# Patient Record
Sex: Male | Born: 1965 | Race: Black or African American | Hispanic: No | Marital: Married | Smoking: Never smoker
Health system: Southern US, Community
[De-identification: ages and names within clinical notes are randomized; demographics above are authoritative.]

## PROBLEM LIST (undated history)

## (undated) ENCOUNTER — Emergency Department (HOSPITAL_BASED_OUTPATIENT_CLINIC_OR_DEPARTMENT_OTHER): Admission: EM | Payer: Self-pay | Source: Home / Self Care

## (undated) DIAGNOSIS — E119 Type 2 diabetes mellitus without complications: Secondary | ICD-10-CM

## (undated) DIAGNOSIS — N186 End stage renal disease: Secondary | ICD-10-CM

## (undated) DIAGNOSIS — C189 Malignant neoplasm of colon, unspecified: Secondary | ICD-10-CM

## (undated) DIAGNOSIS — M1990 Unspecified osteoarthritis, unspecified site: Secondary | ICD-10-CM

## (undated) DIAGNOSIS — I509 Heart failure, unspecified: Secondary | ICD-10-CM

## (undated) DIAGNOSIS — M7989 Other specified soft tissue disorders: Secondary | ICD-10-CM

## (undated) DIAGNOSIS — Z992 Dependence on renal dialysis: Secondary | ICD-10-CM

## (undated) SURGERY — Surgical Case
Anesthesia: *Unknown

---

## 2005-11-27 ENCOUNTER — Emergency Department (HOSPITAL_COMMUNITY): Admission: EM | Admit: 2005-11-27 | Discharge: 2005-11-27 | Payer: Self-pay | Admitting: Emergency Medicine

## 2009-04-02 DIAGNOSIS — IMO0002 Reserved for concepts with insufficient information to code with codable children: Secondary | ICD-10-CM

## 2009-04-02 DIAGNOSIS — M7750 Other enthesopathy of unspecified foot: Secondary | ICD-10-CM | POA: Insufficient documentation

## 2009-04-02 DIAGNOSIS — E113413 Type 2 diabetes mellitus with severe nonproliferative diabetic retinopathy with macular edema, bilateral: Secondary | ICD-10-CM

## 2009-04-02 DIAGNOSIS — E1165 Type 2 diabetes mellitus with hyperglycemia: Secondary | ICD-10-CM

## 2009-04-02 HISTORY — DX: Reserved for concepts with insufficient information to code with codable children: IMO0002

## 2009-04-02 HISTORY — DX: Type 2 diabetes mellitus with severe nonproliferative diabetic retinopathy with macular edema, bilateral: E11.3413

## 2009-04-02 HISTORY — DX: Other enthesopathy of unspecified foot and ankle: M77.50

## 2009-04-02 HISTORY — DX: Type 2 diabetes mellitus with hyperglycemia: E11.65

## 2009-04-06 DIAGNOSIS — E559 Vitamin D deficiency, unspecified: Secondary | ICD-10-CM | POA: Insufficient documentation

## 2014-10-12 ENCOUNTER — Encounter (INDEPENDENT_AMBULATORY_CARE_PROVIDER_SITE_OTHER): Payer: Self-pay | Admitting: Ophthalmology

## 2017-03-21 DIAGNOSIS — E1141 Type 2 diabetes mellitus with diabetic mononeuropathy: Secondary | ICD-10-CM | POA: Diagnosis present

## 2017-03-21 DIAGNOSIS — R809 Proteinuria, unspecified: Secondary | ICD-10-CM | POA: Insufficient documentation

## 2017-03-21 DIAGNOSIS — E1129 Type 2 diabetes mellitus with other diabetic kidney complication: Secondary | ICD-10-CM

## 2017-03-21 HISTORY — DX: Type 2 diabetes mellitus with other diabetic kidney complication: R80.9

## 2017-03-21 HISTORY — DX: Type 2 diabetes mellitus with diabetic mononeuropathy: E11.41

## 2017-03-21 HISTORY — DX: Proteinuria, unspecified: E11.29

## 2018-01-21 ENCOUNTER — Other Ambulatory Visit: Payer: Self-pay

## 2018-01-21 ENCOUNTER — Inpatient Hospital Stay (HOSPITAL_COMMUNITY)
Admission: EM | Admit: 2018-01-21 | Discharge: 2018-01-25 | DRG: 853 | Disposition: A | Discharge status: Home Health | Payer: 59 | Attending: Internal Medicine | Admitting: Internal Medicine

## 2018-01-21 ENCOUNTER — Emergency Department (HOSPITAL_COMMUNITY): Payer: 59

## 2018-01-21 ENCOUNTER — Encounter (HOSPITAL_COMMUNITY): Payer: Self-pay | Admitting: *Deleted

## 2018-01-21 DIAGNOSIS — T797XXA Traumatic subcutaneous emphysema, initial encounter: Secondary | ICD-10-CM | POA: Diagnosis present

## 2018-01-21 DIAGNOSIS — E11621 Type 2 diabetes mellitus with foot ulcer: Secondary | ICD-10-CM | POA: Diagnosis present

## 2018-01-21 DIAGNOSIS — Z23 Encounter for immunization: Secondary | ICD-10-CM

## 2018-01-21 DIAGNOSIS — A4189 Other specified sepsis: Principal | ICD-10-CM | POA: Diagnosis present

## 2018-01-21 DIAGNOSIS — E119 Type 2 diabetes mellitus without complications: Secondary | ICD-10-CM

## 2018-01-21 DIAGNOSIS — S91332A Puncture wound without foreign body, left foot, initial encounter: Secondary | ICD-10-CM | POA: Diagnosis present

## 2018-01-21 DIAGNOSIS — E1140 Type 2 diabetes mellitus with diabetic neuropathy, unspecified: Secondary | ICD-10-CM | POA: Diagnosis present

## 2018-01-21 DIAGNOSIS — L03116 Cellulitis of left lower limb: Secondary | ICD-10-CM | POA: Diagnosis present

## 2018-01-21 DIAGNOSIS — R402140 Coma scale, eyes open, spontaneous, unspecified time: Secondary | ICD-10-CM | POA: Diagnosis not present

## 2018-01-21 DIAGNOSIS — L02612 Cutaneous abscess of left foot: Secondary | ICD-10-CM | POA: Diagnosis present

## 2018-01-21 DIAGNOSIS — M726 Necrotizing fasciitis: Secondary | ICD-10-CM | POA: Diagnosis present

## 2018-01-21 DIAGNOSIS — M7989 Other specified soft tissue disorders: Secondary | ICD-10-CM | POA: Diagnosis not present

## 2018-01-21 DIAGNOSIS — D649 Anemia, unspecified: Secondary | ICD-10-CM | POA: Diagnosis present

## 2018-01-21 DIAGNOSIS — E113413 Type 2 diabetes mellitus with severe nonproliferative diabetic retinopathy with macular edema, bilateral: Secondary | ICD-10-CM

## 2018-01-21 DIAGNOSIS — L97423 Non-pressure chronic ulcer of left heel and midfoot with necrosis of muscle: Secondary | ICD-10-CM

## 2018-01-21 DIAGNOSIS — IMO0002 Reserved for concepts with insufficient information to code with codable children: Secondary | ICD-10-CM

## 2018-01-21 DIAGNOSIS — E1165 Type 2 diabetes mellitus with hyperglycemia: Secondary | ICD-10-CM

## 2018-01-21 DIAGNOSIS — N179 Acute kidney failure, unspecified: Secondary | ICD-10-CM | POA: Diagnosis not present

## 2018-01-21 DIAGNOSIS — Z91030 Bee allergy status: Secondary | ICD-10-CM

## 2018-01-21 DIAGNOSIS — L97529 Non-pressure chronic ulcer of other part of left foot with unspecified severity: Secondary | ICD-10-CM | POA: Diagnosis present

## 2018-01-21 DIAGNOSIS — R402360 Coma scale, best motor response, obeys commands, unspecified time: Secondary | ICD-10-CM | POA: Diagnosis not present

## 2018-01-21 DIAGNOSIS — A419 Sepsis, unspecified organism: Secondary | ICD-10-CM | POA: Diagnosis present

## 2018-01-21 DIAGNOSIS — E11628 Type 2 diabetes mellitus with other skin complications: Secondary | ICD-10-CM | POA: Diagnosis present

## 2018-01-21 DIAGNOSIS — E1169 Type 2 diabetes mellitus with other specified complication: Secondary | ICD-10-CM | POA: Diagnosis present

## 2018-01-21 DIAGNOSIS — M869 Osteomyelitis, unspecified: Secondary | ICD-10-CM | POA: Diagnosis present

## 2018-01-21 DIAGNOSIS — Z794 Long term (current) use of insulin: Secondary | ICD-10-CM

## 2018-01-21 DIAGNOSIS — R402250 Coma scale, best verbal response, oriented, unspecified time: Secondary | ICD-10-CM | POA: Diagnosis not present

## 2018-01-21 HISTORY — DX: Type 2 diabetes mellitus without complications: E11.9

## 2018-01-21 LAB — I-STAT CHEM 8, ED
BUN: 21 mg/dL — ABNORMAL HIGH (ref 6–20)
Calcium, Ion: 1.12 mmol/L — ABNORMAL LOW (ref 1.15–1.40)
Chloride: 95 mmol/L — ABNORMAL LOW (ref 98–111)
Creatinine, Ser: 1.2 mg/dL (ref 0.61–1.24)
Glucose, Bld: 219 mg/dL — ABNORMAL HIGH (ref 70–99)
HCT: 29 % — ABNORMAL LOW (ref 39.0–52.0)
Hemoglobin: 9.9 g/dL — ABNORMAL LOW (ref 13.0–17.0)
Potassium: 4 mmol/L (ref 3.5–5.1)
Sodium: 131 mmol/L — ABNORMAL LOW (ref 135–145)
TCO2: 25 mmol/L (ref 22–32)

## 2018-01-21 LAB — CBC WITH DIFFERENTIAL/PLATELET
Basophils Absolute: 0 10*3/uL (ref 0.0–0.1)
Basophils Relative: 0 %
Eosinophils Absolute: 0 10*3/uL (ref 0.0–0.7)
Eosinophils Relative: 0 %
HCT: 27.9 % — ABNORMAL LOW (ref 39.0–52.0)
Hemoglobin: 9.2 g/dL — ABNORMAL LOW (ref 13.0–17.0)
Lymphocytes Relative: 7 %
Lymphs Abs: 1.2 10*3/uL (ref 0.7–4.0)
MCH: 29.3 pg (ref 26.0–34.0)
MCHC: 33 g/dL (ref 30.0–36.0)
MCV: 88.9 fL (ref 78.0–100.0)
Monocytes Absolute: 1 10*3/uL (ref 0.1–1.0)
Monocytes Relative: 6 %
Neutro Abs: 14.5 10*3/uL — ABNORMAL HIGH (ref 1.7–7.7)
Neutrophils Relative %: 87 %
Platelets: 300 10*3/uL (ref 150–400)
RBC: 3.14 MIL/uL — ABNORMAL LOW (ref 4.22–5.81)
RDW: 12.7 % (ref 11.5–15.5)
WBC: 16.6 10*3/uL — ABNORMAL HIGH (ref 4.0–10.5)

## 2018-01-21 LAB — COMPREHENSIVE METABOLIC PANEL
ALT: 14 U/L (ref 0–44)
AST: 19 U/L (ref 15–41)
Albumin: 3.3 g/dL — ABNORMAL LOW (ref 3.5–5.0)
Alkaline Phosphatase: 90 U/L (ref 38–126)
Anion gap: 15 (ref 5–15)
BUN: 20 mg/dL (ref 6–20)
CO2: 24 mmol/L (ref 22–32)
Calcium: 8.9 mg/dL (ref 8.9–10.3)
Chloride: 93 mmol/L — ABNORMAL LOW (ref 98–111)
Creatinine, Ser: 1.31 mg/dL — ABNORMAL HIGH (ref 0.61–1.24)
GFR calc Af Amer: >60 mL/min (ref >60)
GFR calc non Af Amer: >60 mL/min (ref >60)
Glucose, Bld: 214 mg/dL — ABNORMAL HIGH (ref 70–99)
Potassium: 4.1 mmol/L (ref 3.5–5.1)
Sodium: 132 mmol/L — ABNORMAL LOW (ref 135–145)
Total Bilirubin: 1.9 mg/dL — ABNORMAL HIGH (ref 0.3–1.2)
Total Protein: 8.1 g/dL (ref 6.5–8.1)

## 2018-01-21 LAB — I-STAT CG4 LACTIC ACID, ED: Lactic Acid, Venous: 1.35 mmol/L (ref 0.5–1.9)

## 2018-01-21 MED ORDER — SODIUM CHLORIDE 0.9 % IV BOLUS (SEPSIS)
1000.0000 mL | Freq: Once | INTRAVENOUS | Status: AC
  Administered 2018-01-21: 1000 mL via INTRAVENOUS

## 2018-01-21 MED ORDER — IOPAMIDOL (ISOVUE-300) INJECTION 61%
INTRAVENOUS | Status: AC
  Administered 2018-01-22: 04:00:00
  Filled 2018-01-21: qty 100

## 2018-01-21 MED ORDER — IOPAMIDOL (ISOVUE-300) INJECTION 61%
100.0000 mL | Freq: Once | INTRAVENOUS | Status: AC | PRN
  Administered 2018-01-21: 100 mL via INTRAVENOUS

## 2018-01-21 MED ORDER — VANCOMYCIN HCL IN DEXTROSE 1-5 GM/200ML-% IV SOLN
1000.0000 mg | Freq: Once | INTRAVENOUS | Status: AC
  Administered 2018-01-22: 1000 mg via INTRAVENOUS
  Filled 2018-01-21: qty 200

## 2018-01-21 MED ORDER — PIPERACILLIN-TAZOBACTAM 3.375 G IVPB 30 MIN
3.3750 g | Freq: Once | INTRAVENOUS | Status: AC
  Administered 2018-01-21: 3.375 g via INTRAVENOUS
  Filled 2018-01-21: qty 50

## 2018-01-21 MED ORDER — TETANUS-DIPHTH-ACELL PERTUSSIS 5-2.5-18.5 LF-MCG/0.5 IM SUSP
0.5000 mL | Freq: Once | INTRAMUSCULAR | Status: AC
  Administered 2018-01-21: 0.5 mL via INTRAMUSCULAR
  Filled 2018-01-21: qty 0.5

## 2018-01-21 MED ORDER — CLINDAMYCIN PHOSPHATE 900 MG/50ML IV SOLN
900.0000 mg | Freq: Once | INTRAVENOUS | Status: AC
  Administered 2018-01-22: 900 mg via INTRAVENOUS
  Filled 2018-01-21: qty 50

## 2018-01-21 MED ORDER — ACETAMINOPHEN 500 MG PO TABS
1000.0000 mg | ORAL_TABLET | Freq: Once | ORAL | Status: AC
  Administered 2018-01-21: 1000 mg via ORAL
  Filled 2018-01-21: qty 2

## 2018-01-21 MED ORDER — SODIUM CHLORIDE 0.9 % IV SOLN
1000.0000 mL | INTRAVENOUS | Status: DC
  Administered 2018-01-22 (×4): 1000 mL via INTRAVENOUS

## 2018-01-21 NOTE — ED Notes (Signed)
Chills noted

## 2018-01-21 NOTE — ED Triage Notes (Addendum)
Pt complains of cough, nausea. Pt has left foot ulcer from [place]. Pt has noticed some bleeding to the wound. Pt has hx of diabetes.

## 2018-01-21 NOTE — ED Provider Notes (Signed)
Jeffrey Young Provider Note   CSN: [id number] Arrival date & time: 01/21/18  1848     History   Chief Complaint Chief Complaint  Patient presents with  . Cough  . Wound Check    HPI Jeffrey Young is a 52 y.o. male.  HPI   Patient is a 52 year old male with a history of type 2 diabetes mellitus presenting for left foot ulcer, fever, and cough.  Patient reports that he has had subjective fevers and rigors for 3 days now.  Patient reports that in June 2019, he was on vacation, and his flip-flop on the left foot broke, and he had a puncture wound to the bottom of his left foot.  Patient reports that it has been slow to heal since then, but has not had any increasing erythema or pain.  Patient reports that he has low sensation in his bilateral lower extremities for many months now.  Patient reports that his ulceration appeared to grow larger recently.  Patient also is reporting that he has had a dry cough for greater than 3 months.  Patient denies any sputum production or hemoptysis.  Patient denies any smoking history.  Patient denies any chest pain or shortness of breath.  Patient denies any rhinorrhea, congestion, sore throat.  Patient denies abdominal pain, nausea, vomiting, or diarrhea.  Patient reports applying Vaseline and antibiotic ointment to the ulceration without change in its status.  Past Medical History:  Diagnosis Date  . Diabetes mellitus without complication ([place])     There are no active problems to display for this patient.   History reviewed. No pertinent surgical history.      Home Medications    Prior to Admission medications   Medication Sig Start Date End Date Taking? Authorizing Provider  cetirizine (ZYRTEC) 10 MG tablet Take 10 mg by mouth daily as needed for allergies.   Yes [provider]  Insulin Glargine (LANTUS SOLOSTAR) 100 UNIT/ML Solostar Pen Inject 20 Units into the skin at bedtime.   Yes [provider]    Family History No family history on file.  Social History Social History   Tobacco Use  . Smoking status: Never Smoker  . Smokeless tobacco: Never Used  Substance Use Topics  . Alcohol use: Yes  . Drug use: Not on file     Allergies   Bee venom   Review of Systems Review of Systems  Constitutional: Positive for chills and fever.  HENT: Negative for congestion, rhinorrhea, sinus pain and sore throat.   Eyes: Negative for visual disturbance.  Respiratory: Negative for cough, chest tightness and shortness of breath.   Cardiovascular: Negative for chest pain, palpitations and leg swelling.  Gastrointestinal: Negative for abdominal pain, constipation, diarrhea, nausea and vomiting.  Genitourinary: Negative for dysuria and flank pain.  Musculoskeletal: Negative for back pain and myalgias.  Skin: Positive for color change and wound. Negative for rash.  Neurological: Negative for dizziness, syncope, light-headedness and headaches.     Physical Exam Updated Vital Signs BP (!) 147/84   Pulse (!) 107   Temp (!) 103 F (39.4 C) (Oral)   Resp 16   Ht 6\' 1"  (1.854 m)   Wt 77.1 kg   SpO2 95%   BMI 22.43 kg/m   Physical Exam  Constitutional: He appears well-developed and well-nourished.  Rigors present on my examination.  HENT:  Head: Normocephalic and atraumatic.  Mouth/Throat: Oropharynx is clear and moist.  Eyes: Pupils are equal, round, and  reactive to light. Conjunctivae and EOM are normal.  Neck: Normal range of motion. Neck supple.  Cardiovascular: Normal rate, regular rhythm, S1 normal and S2 normal.  No murmur heard. Pulmonary/Chest: Effort normal and breath sounds normal. He has no wheezes. He has no rales.  Abdominal: Soft. He exhibits no distension. There is no tenderness. There is no guarding.  Musculoskeletal: Normal range of motion. He exhibits edema and deformity.  See clinical photo for details.  Patient has ulceration with muscle  exposure on the plantar aspect of the left lower extremity.  There is erythema in the first dorsal webspace, as well has approximately 50% of the way up with the dorsum of the left foot.  Patient does not have any soft tissue crepitus.  No tenderness palpation of calf of left lower extremity.  Lymphadenopathy:    He has no cervical adenopathy.  Neurological: He is alert.  Cranial nerves grossly intact. Patient moves extremities symmetrically and with good coordination.  Skin: Skin is warm and dry. No rash noted. No erythema.  Psychiatric: He has a normal mood and affect. His behavior is normal. Judgment and thought content normal.  Nursing note and vitals reviewed.      ED Treatments / Results  Labs (all labs ordered are listed, but only abnormal results are displayed) Labs Reviewed  CULTURE, BLOOD (ROUTINE X 2)  CULTURE, BLOOD (ROUTINE X 2)  AEROBIC CULTURE (SUPERFICIAL SPECIMEN)  COMPREHENSIVE METABOLIC PANEL  CBC WITH DIFFERENTIAL/PLATELET  URINALYSIS, ROUTINE W REFLEX MICROSCOPIC  I-STAT CG4 LACTIC ACID, ED  I-STAT CHEM 8, ED    EKG None  Radiology Dg Chest 2 View  Result Date: 01/21/2018 CLINICAL DATA:  Cough. EXAM: CHEST - 2 VIEW COMPARISON:  November 27, 2005 FINDINGS: The heart size and mediastinal contours are within normal limits. Both lungs are clear. The visualized skeletal structures are unremarkable. IMPRESSION: No active cardiopulmonary disease. Electronically Signed   By: Dorise Bullion III M.D   On: 01/21/2018 21:04   Dg Foot Complete Left  Result Date: 01/21/2018 CLINICAL DATA:  Ulcer on left foot.  History of diabetes. EXAM: LEFT FOOT - COMPLETE 3+ VIEW COMPARISON:  None. FINDINGS: There is air in the soft tissues of the second toe and in the webbing between the first and second toes. No associated bony erosion. IMPRESSION: Air in the soft tissues of the second toe in the webbing between the first and second toes is concerning for infection with a gas-forming  organism. No underlying bony erosion identified. Electronically Signed   By: Dorise Bullion III M.D   On: 01/21/2018 21:06    Procedures Procedures (including critical care time)  CRITICAL CARE Performed by: Albesa Seen   Total critical care time: 35 minutes  Critical care time was exclusive of separately billable procedures and treating other patients.  Critical care was necessary to treat or prevent imminent or life-threatening deterioration.  Critical care was time spent personally by me on the following activities: development of treatment plan with patient and/or surrogate as well as nursing, discussions with consultants, evaluation of patient's response to treatment, examination of patient, obtaining history from patient or surrogate, ordering and performing treatments and interventions, ordering and review of laboratory studies, ordering and review of radiographic studies, pulse oximetry and re-evaluation of patient's condition.   Medications Ordered in ED Medications  piperacillin-tazobactam (ZOSYN) IVPB 3.375 g (has no administration in time range)  sodium chloride 0.9 % bolus 1,000 mL (has no administration in time range)  0.9 %  sodium chloride infusion (has no administration in time range)  vancomycin (VANCOCIN) IVPB 1000 mg/200 mL premix (has no administration in time range)  acetaminophen (TYLENOL) tablet 1,000 mg (1,000 mg Oral Given 01/21/18 2102)     Initial Impression / Assessment and Plan / ED Course  I have reviewed the triage vital signs and the nursing notes.  Pertinent labs & imaging results that were available during my care of the patient were reviewed by me and considered in my medical decision making (see chart for details).  Clinical Course as of Jan 23 47  Mon Jan 21, 2018  1158 Called Dr. Randel Pigg of radiology to discuss soft tissue emphysema and differential of this. Discussed that necrotizing fasciitis is a consideration. I appreciate Dr. Marlou Sa  involvement in the care of this patient.    [AM]  2220 Suggestive of acute infection.  WBC(!): 16.6 [AM]  2220 Unknown baseline.   Creatinine(!): 1.31 [AM]  2221 Down 2 points as of 1 year ago.  Hemoglobin(!): 9.2 [AM]  2222 Pt w/o severe sepsis.   Lactic Acid, Venous: 1.35 [AM]  Tue Jan 22, 2018  5465 Patient reassessed. Systolic BP 681 will administer additional fluid bolus.    [AM]    Clinical Course User Index [AM] Albesa Seen, PA-C    Patient is critically ill and requiring a higher level of care. Sepsis suspected. Code sepsis called. Patient meeting SIRS criteria based on tachycardia, fever, leukocytosis. See vitals below. Suspected source of infection soft tissue infection of foot/osteomyelitis based on exam. Lactic acid normal. Signs of end organ dysfunction include slight elevation in creatinine.  X-ray of left foot demonstrates soft tissue gas, suggestive of gas-forming organism.  Will obtain CT scan of the foot, ankle, and fibula/fibula to rule out necrotizing soft tissue infection.  Vitals:   01/21/18 1856 01/21/18 1946 01/21/18 2018 01/21/18 2102  BP: (!) 147/84     Pulse: (!) 107     Resp: 16     Temp: 99.8 F (37.7 C) 100 F (37.8 C) (!) 103 F (39.4 C)   TempSrc: Oral Axillary Oral   SpO2: 95%     Weight:    77.1 kg  Height:    6\' 1"  (1.854 m)     Broad spectrum antibiotics initiated based on suspected source of infection (Vancomycin/Zosyn/Clindamycin for possibility of necrotizing infection).  1 L normal saline administered in addition to continuous fluid repletion.  Patient had no hypotension and no elevation in lactic acid on initial examination.  Sepsis - Repeat Assessment  Performed at:    2314 Vitals     Blood pressure (!) 147/84, pulse (!) 107, temperature (!) 103 F (39.4 C), temperature source Oral, resp. rate 16, height 6\' 1"  (1.854 m), weight 77.1 kg, SpO2 95 %.  Heart:     Regular rate and rhythm  Lungs:    CTA  Capillary Refill:   <2  sec  Peripheral Pulse:   Dorsalis pedis pulse  palpable  Skin:     Normal Color   Orthopedic physician consulted. Pt to be admitted for sepsis. Care signed out to Carmon Sails, PA-C, awaiting these calls at 12:49 AM.    Final Clinical Impressions(s) / ED Diagnoses   Final diagnoses:  Necrotizing soft tissue infection  Sepsis, due to unspecified organism [facility])    ED Discharge Orders    None       Albesa Seen, PA-C 01/22/18 0050    Maudie Flakes, MD 01/22/18 1140

## 2018-01-21 NOTE — Progress Notes (Signed)
A consult was received from an ED physician for vancomycin per pharmacy dosing.  The patient's profile has been reviewed for ht/wt/allergies/indication/available labs.    A one time order has been placed for vancomycin 1gm IV x1.  Further antibiotics/pharmacy consults should be ordered by admitting physician if indicated.                       Thank you, Lynelle Doctor 01/21/2018  8:50 PM

## 2018-01-22 ENCOUNTER — Inpatient Hospital Stay (HOSPITAL_COMMUNITY): Payer: 59

## 2018-01-22 ENCOUNTER — Inpatient Hospital Stay (HOSPITAL_COMMUNITY): Payer: 59 | Admitting: Certified Registered Nurse Anesthetist

## 2018-01-22 ENCOUNTER — Encounter (HOSPITAL_COMMUNITY): Payer: 59

## 2018-01-22 ENCOUNTER — Encounter (HOSPITAL_COMMUNITY): Payer: Self-pay

## 2018-01-22 ENCOUNTER — Encounter (HOSPITAL_COMMUNITY)
Admission: EM | Disposition: A | Discharge status: Home Health | Payer: Self-pay | Source: Home / Self Care | Attending: Internal Medicine

## 2018-01-22 DIAGNOSIS — L97529 Non-pressure chronic ulcer of other part of left foot with unspecified severity: Secondary | ICD-10-CM | POA: Diagnosis present

## 2018-01-22 DIAGNOSIS — L02612 Cutaneous abscess of left foot: Secondary | ICD-10-CM | POA: Diagnosis present

## 2018-01-22 DIAGNOSIS — R402360 Coma scale, best motor response, obeys commands, unspecified time: Secondary | ICD-10-CM | POA: Diagnosis not present

## 2018-01-22 DIAGNOSIS — D649 Anemia, unspecified: Secondary | ICD-10-CM | POA: Diagnosis present

## 2018-01-22 DIAGNOSIS — M726 Necrotizing fasciitis: Secondary | ICD-10-CM | POA: Diagnosis present

## 2018-01-22 DIAGNOSIS — A419 Sepsis, unspecified organism: Secondary | ICD-10-CM | POA: Diagnosis not present

## 2018-01-22 DIAGNOSIS — L97423 Non-pressure chronic ulcer of left heel and midfoot with necrosis of muscle: Secondary | ICD-10-CM

## 2018-01-22 DIAGNOSIS — M7989 Other specified soft tissue disorders: Secondary | ICD-10-CM

## 2018-01-22 DIAGNOSIS — E11621 Type 2 diabetes mellitus with foot ulcer: Secondary | ICD-10-CM | POA: Diagnosis present

## 2018-01-22 DIAGNOSIS — Z91030 Bee allergy status: Secondary | ICD-10-CM | POA: Diagnosis not present

## 2018-01-22 DIAGNOSIS — Z794 Long term (current) use of insulin: Secondary | ICD-10-CM | POA: Diagnosis not present

## 2018-01-22 DIAGNOSIS — S91332A Puncture wound without foreign body, left foot, initial encounter: Secondary | ICD-10-CM | POA: Diagnosis present

## 2018-01-22 DIAGNOSIS — E1140 Type 2 diabetes mellitus with diabetic neuropathy, unspecified: Secondary | ICD-10-CM | POA: Diagnosis present

## 2018-01-22 DIAGNOSIS — R402250 Coma scale, best verbal response, oriented, unspecified time: Secondary | ICD-10-CM | POA: Diagnosis not present

## 2018-01-22 DIAGNOSIS — N179 Acute kidney failure, unspecified: Secondary | ICD-10-CM | POA: Diagnosis not present

## 2018-01-22 DIAGNOSIS — E1169 Type 2 diabetes mellitus with other specified complication: Secondary | ICD-10-CM | POA: Diagnosis present

## 2018-01-22 DIAGNOSIS — E11628 Type 2 diabetes mellitus with other skin complications: Secondary | ICD-10-CM | POA: Diagnosis present

## 2018-01-22 DIAGNOSIS — E119 Type 2 diabetes mellitus without complications: Secondary | ICD-10-CM

## 2018-01-22 DIAGNOSIS — T797XXA Traumatic subcutaneous emphysema, initial encounter: Secondary | ICD-10-CM | POA: Diagnosis present

## 2018-01-22 DIAGNOSIS — Z89432 Acquired absence of left foot: Secondary | ICD-10-CM

## 2018-01-22 DIAGNOSIS — Z23 Encounter for immunization: Secondary | ICD-10-CM | POA: Diagnosis not present

## 2018-01-22 DIAGNOSIS — L03116 Cellulitis of left lower limb: Secondary | ICD-10-CM | POA: Diagnosis present

## 2018-01-22 DIAGNOSIS — M869 Osteomyelitis, unspecified: Secondary | ICD-10-CM | POA: Diagnosis present

## 2018-01-22 DIAGNOSIS — A4189 Other specified sepsis: Secondary | ICD-10-CM | POA: Diagnosis present

## 2018-01-22 DIAGNOSIS — R402140 Coma scale, eyes open, spontaneous, unspecified time: Secondary | ICD-10-CM | POA: Diagnosis not present

## 2018-01-22 HISTORY — DX: Acquired absence of left foot: Z89.432

## 2018-01-22 HISTORY — DX: Sepsis, unspecified organism: A41.9

## 2018-01-22 HISTORY — PX: AMPUTATION: SHX166

## 2018-01-22 HISTORY — DX: Necrotizing fasciitis: M72.6

## 2018-01-22 LAB — GLUCOSE, CAPILLARY
Glucose-Capillary: 126 mg/dL — ABNORMAL HIGH (ref 70–99)
Glucose-Capillary: 135 mg/dL — ABNORMAL HIGH (ref 70–99)
Glucose-Capillary: 144 mg/dL — ABNORMAL HIGH (ref 70–99)
Glucose-Capillary: 155 mg/dL — ABNORMAL HIGH (ref 70–99)
Glucose-Capillary: 156 mg/dL — ABNORMAL HIGH (ref 70–99)
Glucose-Capillary: 163 mg/dL — ABNORMAL HIGH (ref 70–99)
Glucose-Capillary: 169 mg/dL — ABNORMAL HIGH (ref 70–99)

## 2018-01-22 LAB — I-STAT CG4 LACTIC ACID, ED: Lactic Acid, Venous: 1.11 mmol/L (ref 0.5–1.9)

## 2018-01-22 LAB — CBG MONITORING, ED: Glucose-Capillary: 173 mg/dL — ABNORMAL HIGH (ref 70–99)

## 2018-01-22 LAB — MRSA PCR SCREENING: MRSA by PCR: POSITIVE — AB

## 2018-01-22 SURGERY — AMPUTATION, FOOT, PARTIAL
Anesthesia: General | Site: Foot | Laterality: Left

## 2018-01-22 MED ORDER — MIDAZOLAM HCL 2 MG/2ML IJ SOLN
INTRAMUSCULAR | Status: AC
  Filled 2018-01-22: qty 2

## 2018-01-22 MED ORDER — ONDANSETRON HCL 4 MG PO TABS: 4.0000 mg | ORAL_TABLET | Freq: Four times a day (QID) | ORAL | Status: DC | PRN

## 2018-01-22 MED ORDER — MIDAZOLAM HCL 2 MG/2ML IJ SOLN
INTRAMUSCULAR | Status: DC | PRN
  Administered 2018-01-22: 2 mg via INTRAVENOUS

## 2018-01-22 MED ORDER — INSULIN ASPART 100 UNIT/ML ~~LOC~~ SOLN
0.0000 [IU] | SUBCUTANEOUS | Status: DC
  Administered 2018-01-22: 1 [IU] via SUBCUTANEOUS
  Administered 2018-01-22 (×2): 2 [IU] via SUBCUTANEOUS
  Filled 2018-01-22: qty 1

## 2018-01-22 MED ORDER — POLYETHYLENE GLYCOL 3350 17 G PO PACK: 17.0000 g | PACK | Freq: Every day | ORAL | Status: DC | PRN

## 2018-01-22 MED ORDER — 0.9 % SODIUM CHLORIDE (POUR BTL) OPTIME
TOPICAL | Status: DC | PRN
  Administered 2018-01-22: 1000 mL

## 2018-01-22 MED ORDER — ONDANSETRON HCL 4 MG/2ML IJ SOLN
INTRAMUSCULAR | Status: DC | PRN
  Administered 2018-01-22: 4 mg via INTRAVENOUS

## 2018-01-22 MED ORDER — METOCLOPRAMIDE HCL 5 MG PO TABS: 5.0000 mg | ORAL_TABLET | Freq: Three times a day (TID) | ORAL | Status: DC | PRN

## 2018-01-22 MED ORDER — ACETAMINOPHEN 325 MG PO TABS: 650.0000 mg | ORAL_TABLET | Freq: Four times a day (QID) | ORAL | Status: DC | PRN

## 2018-01-22 MED ORDER — METOCLOPRAMIDE HCL 5 MG/ML IJ SOLN: 5.0000 mg | Freq: Three times a day (TID) | INTRAMUSCULAR | Status: DC | PRN

## 2018-01-22 MED ORDER — LIDOCAINE 2% (20 MG/ML) 5 ML SYRINGE
INTRAMUSCULAR | Status: DC | PRN
  Administered 2018-01-22: 40 mg via INTRAVENOUS

## 2018-01-22 MED ORDER — POVIDONE-IODINE 10 % EX SWAB
2.0000 "application " | Freq: Once | CUTANEOUS | Status: DC
  Administered 2018-01-22: 2 via TOPICAL

## 2018-01-22 MED ORDER — MAGNESIUM CITRATE PO SOLN: 1.0000 | Freq: Once | ORAL | Status: DC | PRN

## 2018-01-22 MED ORDER — INSULIN ASPART 100 UNIT/ML ~~LOC~~ SOLN: 0.0000 [IU] | Freq: Every day | SUBCUTANEOUS | Status: DC

## 2018-01-22 MED ORDER — LIDOCAINE 2% (20 MG/ML) 5 ML SYRINGE
INTRAMUSCULAR | Status: AC
  Filled 2018-01-22: qty 5

## 2018-01-22 MED ORDER — PHENYLEPHRINE 40 MCG/ML (10ML) SYRINGE FOR IV PUSH (FOR BLOOD PRESSURE SUPPORT)
PREFILLED_SYRINGE | INTRAVENOUS | Status: AC
  Filled 2018-01-22: qty 10

## 2018-01-22 MED ORDER — CEFAZOLIN SODIUM-DEXTROSE 2-4 GM/100ML-% IV SOLN
2.0000 g | INTRAVENOUS | Status: DC
  Filled 2018-01-22: qty 100

## 2018-01-22 MED ORDER — ONDANSETRON HCL 4 MG/2ML IJ SOLN
4.0000 mg | Freq: Four times a day (QID) | INTRAMUSCULAR | Status: DC | PRN
  Administered 2018-01-22: 4 mg via INTRAVENOUS
  Filled 2018-01-22: qty 2

## 2018-01-22 MED ORDER — METHOCARBAMOL 1000 MG/10ML IJ SOLN
500.0000 mg | Freq: Four times a day (QID) | INTRAVENOUS | Status: DC | PRN
  Filled 2018-01-22: qty 5

## 2018-01-22 MED ORDER — METHOCARBAMOL 500 MG PO TABS: 500.0000 mg | ORAL_TABLET | Freq: Four times a day (QID) | ORAL | Status: DC | PRN

## 2018-01-22 MED ORDER — FENTANYL CITRATE (PF) 250 MCG/5ML IJ SOLN
INTRAMUSCULAR | Status: DC | PRN
  Administered 2018-01-22 (×2): 25 ug via INTRAVENOUS

## 2018-01-22 MED ORDER — PROPOFOL 10 MG/ML IV BOLUS
INTRAVENOUS | Status: AC
  Filled 2018-01-22: qty 20

## 2018-01-22 MED ORDER — OXYCODONE HCL 5 MG PO TABS: 5.0000 mg | ORAL_TABLET | ORAL | Status: DC | PRN

## 2018-01-22 MED ORDER — CLINDAMYCIN PHOSPHATE 900 MG/50ML IV SOLN
900.0000 mg | Freq: Three times a day (TID) | INTRAVENOUS | Status: DC
  Administered 2018-01-22 (×2): 900 mg via INTRAVENOUS
  Filled 2018-01-22 (×2): qty 50

## 2018-01-22 MED ORDER — PIPERACILLIN-TAZOBACTAM 3.375 G IVPB
3.3750 g | Freq: Three times a day (TID) | INTRAVENOUS | Status: DC
  Administered 2018-01-22 – 2018-01-24 (×8): 3.375 g via INTRAVENOUS
  Filled 2018-01-22 (×8): qty 50

## 2018-01-22 MED ORDER — SODIUM CHLORIDE 0.9 % IV BOLUS (SEPSIS)
1000.0000 mL | Freq: Once | INTRAVENOUS | Status: AC
  Administered 2018-01-22: 1000 mL via INTRAVENOUS

## 2018-01-22 MED ORDER — SODIUM CHLORIDE 0.9 % IV SOLN
INTRAVENOUS | Status: DC
  Administered 2018-01-22: 12:00:00 via INTRAVENOUS

## 2018-01-22 MED ORDER — PROPOFOL 10 MG/ML IV BOLUS
INTRAVENOUS | Status: DC | PRN
  Administered 2018-01-22: 170 mg via INTRAVENOUS

## 2018-01-22 MED ORDER — HYDROMORPHONE HCL 1 MG/ML IJ SOLN: 0.5000 mg | INTRAMUSCULAR | Status: DC | PRN

## 2018-01-22 MED ORDER — INSULIN GLARGINE 100 UNIT/ML ~~LOC~~ SOLN
10.0000 [IU] | Freq: Every day | SUBCUTANEOUS | Status: DC
  Administered 2018-01-22 – 2018-01-24 (×3): 10 [IU] via SUBCUTANEOUS
  Filled 2018-01-22 (×6): qty 0.1

## 2018-01-22 MED ORDER — MUPIROCIN 2 % EX OINT
TOPICAL_OINTMENT | CUTANEOUS | Status: AC
  Administered 2018-01-22: 13:00:00
  Filled 2018-01-22: qty 22

## 2018-01-22 MED ORDER — ADULT MULTIVITAMIN W/MINERALS CH
1.0000 | ORAL_TABLET | Freq: Every day | ORAL | Status: DC
  Administered 2018-01-22 – 2018-01-25 (×4): 1 via ORAL
  Filled 2018-01-22 (×4): qty 1

## 2018-01-22 MED ORDER — OXYCODONE HCL 5 MG PO TABS: 10.0000 mg | ORAL_TABLET | ORAL | Status: DC | PRN

## 2018-01-22 MED ORDER — ACETAMINOPHEN 325 MG PO TABS
325.0000 mg | ORAL_TABLET | Freq: Four times a day (QID) | ORAL | Status: DC | PRN
  Administered 2018-01-22: 650 mg via ORAL
  Filled 2018-01-22: qty 2

## 2018-01-22 MED ORDER — ACETAMINOPHEN 650 MG RE SUPP: 650.0000 mg | Freq: Four times a day (QID) | RECTAL | Status: DC | PRN

## 2018-01-22 MED ORDER — CHLORHEXIDINE GLUCONATE 4 % EX LIQD
60.0000 mL | Freq: Once | CUTANEOUS | Status: DC
  Administered 2018-01-22: 4 via TOPICAL
  Filled 2018-01-22: qty 15

## 2018-01-22 MED ORDER — GLUCERNA SHAKE PO LIQD
237.0000 mL | Freq: Two times a day (BID) | ORAL | Status: DC
  Administered 2018-01-22 – 2018-01-25 (×4): 237 mL via ORAL
  Filled 2018-01-22 (×3): qty 237

## 2018-01-22 MED ORDER — DOCUSATE SODIUM 100 MG PO CAPS
100.0000 mg | ORAL_CAPSULE | Freq: Two times a day (BID) | ORAL | Status: DC
  Administered 2018-01-22 – 2018-01-25 (×6): 100 mg via ORAL
  Filled 2018-01-22 (×7): qty 1

## 2018-01-22 MED ORDER — ONDANSETRON HCL 4 MG/2ML IJ SOLN
INTRAMUSCULAR | Status: AC
  Filled 2018-01-22: qty 2

## 2018-01-22 MED ORDER — LACTATED RINGERS IV SOLN
INTRAVENOUS | Status: DC | PRN
  Administered 2018-01-22: 09:00:00 via INTRAVENOUS

## 2018-01-22 MED ORDER — ONDANSETRON HCL 4 MG/2ML IJ SOLN
4.0000 mg | Freq: Four times a day (QID) | INTRAMUSCULAR | Status: DC | PRN
  Administered 2018-01-23 – 2018-01-24 (×2): 4 mg via INTRAVENOUS
  Filled 2018-01-22 (×2): qty 2

## 2018-01-22 MED ORDER — SODIUM CHLORIDE 0.9 % IV BOLUS (SEPSIS)
500.0000 mL | Freq: Once | INTRAVENOUS | Status: AC
  Administered 2018-01-22: 500 mL via INTRAVENOUS

## 2018-01-22 MED ORDER — BISACODYL 10 MG RE SUPP
10.0000 mg | Freq: Every day | RECTAL | Status: DC | PRN
  Filled 2018-01-22: qty 1

## 2018-01-22 MED ORDER — MUPIROCIN 2 % EX OINT
TOPICAL_OINTMENT | Freq: Two times a day (BID) | CUTANEOUS | Status: DC
  Administered 2018-01-22: 09:00:00 via NASAL
  Administered 2018-01-22: 1 via NASAL
  Administered 2018-01-23 – 2018-01-25 (×5): via NASAL
  Filled 2018-01-22 (×3): qty 22

## 2018-01-22 MED ORDER — PHENYLEPHRINE 40 MCG/ML (10ML) SYRINGE FOR IV PUSH (FOR BLOOD PRESSURE SUPPORT)
PREFILLED_SYRINGE | INTRAVENOUS | Status: DC | PRN
  Administered 2018-01-22 (×5): 80 ug via INTRAVENOUS

## 2018-01-22 MED ORDER — VANCOMYCIN HCL IN DEXTROSE 750-5 MG/150ML-% IV SOLN
750.0000 mg | Freq: Two times a day (BID) | INTRAVENOUS | Status: DC
  Administered 2018-01-22 – 2018-01-24 (×5): 750 mg via INTRAVENOUS
  Filled 2018-01-22 (×6): qty 150

## 2018-01-22 MED ORDER — INSULIN ASPART 100 UNIT/ML ~~LOC~~ SOLN
0.0000 [IU] | Freq: Three times a day (TID) | SUBCUTANEOUS | Status: DC
  Administered 2018-01-22: 2 [IU] via SUBCUTANEOUS
  Administered 2018-01-23 – 2018-01-25 (×5): 1 [IU] via SUBCUTANEOUS
  Administered 2018-01-25: 2 [IU] via SUBCUTANEOUS

## 2018-01-22 MED ORDER — FENTANYL CITRATE (PF) 250 MCG/5ML IJ SOLN
INTRAMUSCULAR | Status: AC
  Filled 2018-01-22: qty 5

## 2018-01-22 MED ORDER — GADOBENATE DIMEGLUMINE 529 MG/ML IV SOLN
20.0000 mL | Freq: Once | INTRAVENOUS | Status: AC | PRN
  Administered 2018-01-22: 16 mL via INTRAVENOUS

## 2018-01-22 SURGICAL SUPPLY — 35 items
BENZOIN TINCTURE PRP APPL 2/3 (GAUZE/BANDAGES/DRESSINGS) ×4 IMPLANT
BLADE SAW SGTL HD 18.5X60.5X1. (BLADE) ×2 IMPLANT
BLADE SURG 21 STRL SS (BLADE) ×2 IMPLANT
BNDG COHESIVE 4X5 TAN STRL (GAUZE/BANDAGES/DRESSINGS) ×2 IMPLANT
BNDG GAUZE ELAST 4 BULKY (GAUZE/BANDAGES/DRESSINGS) ×2 IMPLANT
COVER SURGICAL LIGHT HANDLE (MISCELLANEOUS) ×2 IMPLANT
DRAPE INCISE IOBAN 66X45 STRL (DRAPES) ×2 IMPLANT
DRAPE U-SHAPE 47X51 STRL (DRAPES) ×2 IMPLANT
DRESSING PEEL AND PLC PRVNA 13 (GAUZE/BANDAGES/DRESSINGS) ×1 IMPLANT
DRSG ADAPTIC 3X8 NADH LF (GAUZE/BANDAGES/DRESSINGS) IMPLANT
DRSG PAD ABDOMINAL 8X10 ST (GAUZE/BANDAGES/DRESSINGS) IMPLANT
DRSG PEEL AND PLACE PREVENA 13 (GAUZE/BANDAGES/DRESSINGS) ×2
DURAPREP 26ML APPLICATOR (WOUND CARE) ×2 IMPLANT
ELECT REM PT RETURN 9FT ADLT (ELECTROSURGICAL) ×2
ELECTRODE REM PT RTRN 9FT ADLT (ELECTROSURGICAL) ×1 IMPLANT
GAUZE SPONGE 4X4 12PLY STRL (GAUZE/BANDAGES/DRESSINGS) IMPLANT
GLOVE BIOGEL PI IND STRL 9 (GLOVE) ×1 IMPLANT
GLOVE BIOGEL PI INDICATOR 9 (GLOVE) ×1
GLOVE SURG ORTHO 9.0 STRL STRW (GLOVE) ×2 IMPLANT
GOWN STRL REUS W/ TWL XL LVL3 (GOWN DISPOSABLE) ×3 IMPLANT
GOWN STRL REUS W/TWL XL LVL3 (GOWN DISPOSABLE) ×3
KIT BASIN OR (CUSTOM PROCEDURE TRAY) ×2 IMPLANT
KIT DRSG PREVENA PLUS 7DAY 125 (MISCELLANEOUS) ×2 IMPLANT
KIT TURNOVER KIT B (KITS) ×2 IMPLANT
NS IRRIG 1000ML POUR BTL (IV SOLUTION) ×2 IMPLANT
PACK ORTHO EXTREMITY (CUSTOM PROCEDURE TRAY) ×2 IMPLANT
PAD ARMBOARD 7.5X6 YLW CONV (MISCELLANEOUS) ×4 IMPLANT
SPONGE LAP 18X18 X RAY DECT (DISPOSABLE) IMPLANT
SUT ETHILON 2 0 PSLX (SUTURE) ×4 IMPLANT
SUT VIC AB 2-0 CTB1 (SUTURE) IMPLANT
TOWEL OR 17X24 6PK STRL BLUE (TOWEL DISPOSABLE) IMPLANT
TOWEL OR 17X26 10 PK STRL BLUE (TOWEL DISPOSABLE) ×2 IMPLANT
TUBE CONNECTING 12X1/4 (SUCTIONS) ×2 IMPLANT
WATER STERILE IRR 1000ML POUR (IV SOLUTION) IMPLANT
YANKAUER SUCT BULB TIP NO VENT (SUCTIONS) ×2 IMPLANT

## 2018-01-22 NOTE — Progress Notes (Signed)
52 year old patient with diabetes describes fevers and chills of 3 days.  He had a puncture wound to the foot in June.  Emergency room evaluation demonstrates dorsal swelling with some gas-forming present in the vicinity of the plantar ulcer within the dorsal tissues around the metatarsal heads #2 and 3  White count 16,000.  Last systolic blood pressure 811.  Lactic acid 1.35  CT scan demonstrates some soft tissue gas in the vicinity of the plantar ulcer but it is more dorsal around the second and third metatarsal heads.  There is some soft tissue swelling and edema dorsal aspect of the foot in this region.  Plan at this time is to initiate IV antibiotics.  I have recommended transfer to [facility] with MRI scanning of the foot.  Is likely that he will require surgery later today.  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  Systolic blood pressure and lactic acid in the normal range at this time.

## 2018-01-22 NOTE — Consult Note (Signed)
[facility] Nurse wound consult note Reason for Consult: Simultaneous consulted with Orthopedics.  Noted is Dr. Jess Barters visit this am and plan for transmetatarsal amputation later today.   [facility] nursing team will not follow, but will remain available to this patient, the nursing and medical teams.  Please re-consult if needed. Thanks, Maudie Flakes, MSN, RN, [place], Arther Abbott  Pager# [phone]

## 2018-01-22 NOTE — Progress Notes (Signed)
ED TO INPATIENT HANDOFF REPORT  Name/Age/Gender Jeffrey Young 52 y.o. male  Code Status    Code Status Orders  (From admission, onward)         Start     Ordered   01/22/18 0109  Full code  Continuous     01/22/18 0111        Code Status History    This patient has a current code status but no historical code status.      Home/SNF/Other Home  Chief Complaint Cough;Wound Check  Level of Care/Admitting Diagnosis ED Disposition    ED Disposition Condition [facility] Area: [place] [100100]  Level of Care: Stepdown [14]  Diagnosis: Necrotizing fasciitis of ankle and foot [facility]) [[id number]]  Admitting Physician: Doreatha Massed  Attending Physician: Etta Quill [phone]  Estimated length of stay: past midnight tomorrow  Certification:: I certify this patient will need inpatient services for at least 2 midnights  PT Class (Do Not Modify): Inpatient [101]  PT Acc Code (Do Not Modify): Private [1]       Medical History Past Medical History:  Diagnosis Date  . Diabetes mellitus without complication (HCC)     Allergies Allergies  Allergen Reactions  . Bee Venom Swelling    Cold Sweats    IV Location/Drains/Wounds Patient Lines/Drains/Airways Status   Active Line/Drains/Airways    Name:   Placement date:   Placement time:   Site:   Days:   Peripheral IV 01/21/18 Right Antecubital   01/21/18    2144    Antecubital   1   Peripheral IV 01/21/18 Left Antecubital   01/21/18    2130    Antecubital   1          Labs/Imaging Results for orders placed or performed during the hospital encounter of 01/21/18 (from the past 48 hour(s))  Comprehensive metabolic panel     Status: Abnormal   Collection Time: 01/21/18  9:19 PM  Result Value Ref Range   Sodium 132 (L) 135 - 145 mmol/L   Potassium 4.1 3.5 - 5.1 mmol/L   Chloride 93 (L) 98 - 111 mmol/L   CO2 24 22 - 32 mmol/L   Glucose, Bld 214 (H) 70 - 99 mg/dL   BUN 20 6  - 20 mg/dL   Creatinine, Ser 1.31 (H) 0.61 - 1.24 mg/dL   Calcium 8.9 8.9 - 10.3 mg/dL   Total Protein 8.1 6.5 - 8.1 g/dL   Albumin 3.3 (L) 3.5 - 5.0 g/dL   AST 19 15 - 41 U/L   ALT 14 0 - 44 U/L   Alkaline Phosphatase 90 38 - 126 U/L   Total Bilirubin 1.9 (H) 0.3 - 1.2 mg/dL   GFR calc non Af Amer >60 >60 mL/min   GFR calc Af Amer >60 >60 mL/min    Comment: (NOTE) The eGFR has been calculated using the CKD EPI equation. This calculation has not been validated in all clinical situations. eGFR's persistently <60 mL/min signify possible Chronic Kidney Disease.    Anion gap 15 5 - 15    Comment: Performed at [facility], [place] [address], [place], [place] [address]  CBC WITH DIFFERENTIAL     Status: Abnormal   Collection Time: 01/21/18  9:19 PM  Result Value Ref Range   WBC 16.6 (H) 4.0 - 10.5 K/uL   RBC 3.14 (L) 4.22 - 5.81 MIL/uL   Hemoglobin 9.2 (L) 13.0 - 17.0  g/dL   HCT 27.9 (L) 39.0 - 52.0 %   MCV 88.9 78.0 - 100.0 fL   MCH 29.3 26.0 - 34.0 pg   MCHC 33.0 30.0 - 36.0 g/dL   RDW 12.7 11.5 - 15.5 %   Platelets 300 150 - 400 K/uL   Neutrophils Relative % 87 %   Neutro Abs 14.5 (H) 1.7 - 7.7 K/uL   Lymphocytes Relative 7 %   Lymphs Abs 1.2 0.7 - 4.0 K/uL   Monocytes Relative 6 %   Monocytes Absolute 1.0 0.1 - 1.0 K/uL   Eosinophils Relative 0 %   Eosinophils Absolute 0.0 0.0 - 0.7 K/uL   Basophils Relative 0 %   Basophils Absolute 0.0 0.0 - 0.1 K/uL    Comment: Performed at [facility], [place] [address], [place], [place] [address]  I-Stat Chem 8, ED     Status: Abnormal   Collection Time: 01/21/18  9:35 PM  Result Value Ref Range   Sodium 131 (L) 135 - 145 mmol/L   Potassium 4.0 3.5 - 5.1 mmol/L   Chloride 95 (L) 98 - 111 mmol/L   BUN 21 (H) 6 - 20 mg/dL   Creatinine, Ser 1.20 0.61 - 1.24 mg/dL   Glucose, Bld 219 (H) 70 - 99 mg/dL   Calcium, Ion 1.12 (L) 1.15 - 1.40 mmol/L   TCO2 25 22 - 32 mmol/L   Hemoglobin 9.9 (L) 13.0 - 17.0  g/dL   HCT 29.0 (L) 39.0 - 52.0 %  I-Stat CG4 Lactic Acid, ED  (not at  [facility])     Status: None   Collection Time: 01/21/18  9:48 PM  Result Value Ref Range   Lactic Acid, Venous 1.35 0.5 - 1.9 mmol/L  I-Stat CG4 Lactic Acid, ED  (not at  [facility])     Status: None   Collection Time: 01/22/18 12:15 AM  Result Value Ref Range   Lactic Acid, Venous 1.11 0.5 - 1.9 mmol/L   Dg Chest 2 View  Result Date: 01/21/2018 CLINICAL DATA:  Cough. EXAM: CHEST - 2 VIEW COMPARISON:  November 27, 2005 FINDINGS: The heart size and mediastinal contours are within normal limits. Both lungs are clear. The visualized skeletal structures are unremarkable. IMPRESSION: No active cardiopulmonary disease. Electronically Signed   By: Dorise Bullion III M.D   On: 01/21/2018 21:04   Ct Tibia Fibula Left W Contrast  Addendum Date: 01/22/2018   ADDENDUM REPORT: 01/22/2018 00:00 ADDENDUM: Were clinical detail has been given. There is a soft tissue ulceration along the plantar aspect of the forefoot at the base of the second toe however, this does not appear directly contiguous with the soft tissue emphysema seen in the webspace and about the second digit which is more dorsal in position. Therefore, the possibility of a necrotizing fasciitis should be considered. These results were called by telephone at the time of interpretation on 01/21/2018 at 11:58 pm to [place] , who verbally acknowledged these results. Electronically Signed   By: Ashley Royalty M.D.   On: 01/22/2018 00:00   Result Date: 01/22/2018 CLINICAL DATA:  52 year old male diabetic with left foot ulcer from burn to his left foot on sand at the [facility] a few months ago. Patient noticed some bleeding at the wound. EXAM: CT OF THE LOWER LEFT EXTREMITY WITH CONTRAST; CT LEFT ANKLE WITH CONTRAST; CT LEFT FOOT WITH CONTRAST TECHNIQUE: Multidetector CT imaging of the lower left extremity was performed according to the standard protocol following intravenous contrast  administration.  COMPARISON:  None. CONTRAST:  124m ISOVUE-300 IOPAMIDOL (ISOVUE-300) INJECTION 61% FINDINGS: Imaging of the right lower extremity from mid patellofemoral compartment through the toes after IV contrast. Soft tissue and bone windows are provided with reformats. Soft tissues and bones: Soft tissue emphysema is identified over the dorsum and medial aspect of the second toe as well as within the first webspace extending to the MTP joint level. No drainable fluid collection or abscess. No acute fracture or frank bone destruction is identified of the adjacent osseous structures, namely the first, second and third toes. Subchondral cyst of the first metatarsal head with sclerosis likely on the basis of osteoarthritis is identified. Intact midfoot and Lisfranc articulation. 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 The tibiotalar and subtalar joints are intact. No abnormality of the talar dome, tibial plafond, tibia nor fibula. The included femoral condyles are unremarkable. The included patella is intact with slight joint space narrowing of the lateral patellofemoral compartment of the knee. There is mild subcutaneous soft tissue induration left leg, ankle and foot without drainable soft tissue fluid collections. No abscess is visualized. Tendons and muscles: The extensor and flexor tendons crossing the ankle joint demonstrate no acute appearing abnormalities. No pyomyositis, intramuscular abscess nor significant atrophy of the musculature. Ligaments: Noncontributory and not well visualized on CT. IMPRESSION: Subcutaneous emphysema and soft tissue swelling involving the first webspace of the left foot and dorsomedial aspect of the left second toe. No adjacent bone destruction suggestive of osteomyelitis. No drainable fluid collections. Mild degenerative change along the dorsum of the talonavicular joint, first MTP and lateral patellofemoral compartment of the included knee. No acute osseous  abnormality. Mild diffuse soft tissue induration compatible cellulitis of the leg, ankle and foot. No drainable abscess or fluid collections. Electronically Signed: By: DAshley RoyaltyM.D. On: 01/21/2018 23:40   Ct Ankle Left W Contrast  Addendum Date: 01/22/2018   ADDENDUM REPORT: 01/22/2018 00:00 ADDENDUM: Were clinical detail has been given. There is a soft tissue ulceration along the plantar aspect of the forefoot at the base of the second toe however, this does not appear directly contiguous with the soft tissue emphysema seen in the webspace and about the second digit which is more dorsal in position. Therefore, the possibility of a necrotizing fasciitis should be considered. These results were called by telephone at the time of interpretation on 01/21/2018 at 11:58 pm to [place], who verbally acknowledged these results. Electronically Signed   By: DAshley RoyaltyM.D.   On: 01/22/2018 00:00   Result Date: 01/22/2018 CLINICAL DATA:  52year old male diabetic with left foot ulcer from burn to his left foot on sand at the [facility] a few months ago. Patient noticed some bleeding at the wound. EXAM: CT OF THE LOWER LEFT EXTREMITY WITH CONTRAST; CT LEFT ANKLE WITH CONTRAST; CT LEFT FOOT WITH CONTRAST TECHNIQUE: Multidetector CT imaging of the lower left extremity was performed according to the standard protocol following intravenous contrast administration. COMPARISON:  None. CONTRAST:  1061mISOVUE-300 IOPAMIDOL (ISOVUE-300) INJECTION 61% FINDINGS: Imaging of the right lower extremity from mid patellofemoral compartment through the toes after IV contrast. Soft tissue and bone windows are provided with reformats. Soft tissues and bones: Soft tissue emphysema is identified over the dorsum and medial aspect of the second toe as well as within the first webspace extending to the MTP joint level. No drainable fluid collection or abscess. No acute fracture or frank bone destruction is identified of the adjacent osseous  structures, namely the first, second and third toes. Subchondral cyst of the first metatarsal head with sclerosis likely on the basis of osteoarthritis is identified. Intact midfoot and Lisfranc articulation. 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 The tibiotalar and subtalar joints are intact. No abnormality of the talar dome, tibial plafond, tibia nor fibula. The included femoral condyles are unremarkable. The included patella is intact with slight joint space narrowing of the lateral patellofemoral compartment of the knee. There is mild subcutaneous soft tissue induration left leg, ankle and foot without drainable soft tissue fluid collections. No abscess is visualized. Tendons and muscles: The extensor and flexor tendons crossing the ankle joint demonstrate no acute appearing abnormalities. No pyomyositis, intramuscular abscess nor significant atrophy of the musculature. Ligaments: Noncontributory and not well visualized on CT. IMPRESSION: Subcutaneous emphysema and soft tissue swelling involving the first webspace of the left foot and dorsomedial aspect of the left second toe. No adjacent bone destruction suggestive of osteomyelitis. No drainable fluid collections. Mild degenerative change along the dorsum of the talonavicular joint, first MTP and lateral patellofemoral compartment of the included knee. No acute osseous abnormality. Mild diffuse soft tissue induration compatible cellulitis of the leg, ankle and foot. No drainable abscess or fluid collections. Electronically Signed: By: Ashley Royalty M.D. On: 01/21/2018 23:40   Ct Foot Left W Contrast  Addendum Date: 01/22/2018   ADDENDUM REPORT: 01/22/2018 00:00 ADDENDUM: Were clinical detail has been given. There is a soft tissue ulceration along the plantar aspect of the forefoot at the base of the second toe however, this does not appear directly contiguous with the soft tissue emphysema seen in the webspace and about the second  digit which is more dorsal in position. Therefore, the possibility of a necrotizing fasciitis should be considered. These results were called by telephone at the time of interpretation on 01/21/2018 at 11:58 pm to [place] , who verbally acknowledged these results. Electronically Signed   By: Ashley Royalty M.D.   On: 01/22/2018 00:00   Result Date: 01/22/2018 CLINICAL DATA:  52 year old male diabetic with left foot ulcer from burn to his left foot on sand at the [facility] a few months ago. Patient noticed some bleeding at the wound. EXAM: CT OF THE LOWER LEFT EXTREMITY WITH CONTRAST; CT LEFT ANKLE WITH CONTRAST; CT LEFT FOOT WITH CONTRAST TECHNIQUE: Multidetector CT imaging of the lower left extremity was performed according to the standard protocol following intravenous contrast administration. COMPARISON:  None. CONTRAST:  162m ISOVUE-300 IOPAMIDOL (ISOVUE-300) INJECTION 61% FINDINGS: Imaging of the right lower extremity from mid patellofemoral compartment through the toes after IV contrast. Soft tissue and bone windows are provided with reformats. Soft tissues and bones: Soft tissue emphysema is identified over the dorsum and medial aspect of the second toe as well as within the first webspace extending to the MTP joint level. No drainable fluid collection or abscess. No acute fracture or frank bone destruction is identified of the adjacent osseous structures, namely the first, second and third toes. Subchondral cyst of the first metatarsal head with sclerosis likely on the basis of osteoarthritis is identified. Intact midfoot and Lisfranc articulation. 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 The tibiotalar and subtalar joints are intact. No abnormality of the talar dome, tibial plafond, tibia nor fibula. The included femoral condyles are unremarkable. The included patella is intact with slight joint space narrowing of the lateral patellofemoral compartment of the knee. There is mild  subcutaneous soft tissue induration left leg,  ankle and foot without drainable soft tissue fluid collections. No abscess is visualized. Tendons and muscles: The extensor and flexor tendons crossing the ankle joint demonstrate no acute appearing abnormalities. No pyomyositis, intramuscular abscess nor significant atrophy of the musculature. Ligaments: Noncontributory and not well visualized on CT. IMPRESSION: Subcutaneous emphysema and soft tissue swelling involving the first webspace of the left foot and dorsomedial aspect of the left second toe. No adjacent bone destruction suggestive of osteomyelitis. No drainable fluid collections. Mild degenerative change along the dorsum of the talonavicular joint, first MTP and lateral patellofemoral compartment of the included knee. No acute osseous abnormality. Mild diffuse soft tissue induration compatible cellulitis of the leg, ankle and foot. No drainable abscess or fluid collections. Electronically Signed: By: Ashley Royalty M.D. On: 01/21/2018 23:40   Dg Foot Complete Left  Result Date: 01/21/2018 CLINICAL DATA:  Ulcer on left foot.  History of diabetes. EXAM: LEFT FOOT - COMPLETE 3+ VIEW COMPARISON:  None. FINDINGS: There is air in the soft tissues of the second toe and in the webbing between the first and second toes. No associated bony erosion. IMPRESSION: Air in the soft tissues of the second toe in the webbing between the first and second toes is concerning for infection with a gas-forming organism. No underlying bony erosion identified. Electronically Signed   By: Dorise Bullion III M.D   On: 01/21/2018 21:06    Pending Labs Unresulted Labs (From admission, onward)    Start     Ordered   01/22/18 0500  CBC  Tomorrow morning,   R     01/22/18 0111   01/22/18 1027  Basic metabolic panel  Tomorrow morning,   R     01/22/18 0111   01/22/18 0112  Hemoglobin A1c  Once,   R     01/22/18 0117   01/22/18 0112  Sedimentation rate  Once,   R     01/22/18 0117    01/22/18 0112  C-reactive protein  Once,   R     01/22/18 0117   01/22/18 0112  Prealbumin  Once,   R     01/22/18 0117   01/22/18 0108  HIV antibody (Routine Testing)  Once,   R     01/22/18 0111   01/21/18 2022  Wound or Superficial Culture  Once,   STAT     01/21/18 2026   01/21/18 2021  Blood Culture (routine x 2)  BLOOD CULTURE X 2,   STAT     01/21/18 2026   01/21/18 2021  Urinalysis, Routine w reflex microscopic  STAT,   STAT     01/21/18 2026          Vitals/Pain Today's Vitals   01/21/18 2245 01/21/18 2330 01/21/18 2346 01/22/18 0002  BP: 128/75 113/67    Pulse: 93 90    Resp: (!) 26 (!) 26    Temp:    100.2 F (37.9 C)  TempSrc:    Oral  SpO2: 98% 99%    Weight:      Height:      PainSc:   0-No pain     Isolation Precautions No active isolations  Medications Medications  0.9 %  sodium chloride infusion (1,000 mLs Intravenous New Bag/Given 01/22/18 0000)  iopamidol (ISOVUE-300) 61 % injection (has no administration in time range)  sodium chloride 0.9 % bolus 1,000 mL (has no administration in time range)    And  sodium chloride 0.9 % bolus 500 mL (has no  administration in time range)  acetaminophen (TYLENOL) tablet 650 mg (has no administration in time range)    Or  acetaminophen (TYLENOL) suppository 650 mg (has no administration in time range)  ondansetron (ZOFRAN) tablet 4 mg (has no administration in time range)    Or  ondansetron (ZOFRAN) injection 4 mg (has no administration in time range)  clindamycin (CLEOCIN) IVPB 900 mg (has no administration in time range)  insulin aspart (novoLOG) injection 0-9 Units (has no administration in time range)  insulin glargine (LANTUS) injection 10 Units (has no administration in time range)  acetaminophen (TYLENOL) tablet 1,000 mg (1,000 mg Oral Given 01/21/18 2102)  piperacillin-tazobactam (ZOSYN) IVPB 3.375 g (0 g Intravenous Stopped 01/21/18 2221)  sodium chloride 0.9 % bolus 1,000 mL (1,000 mLs Intravenous New  Bag/Given 01/21/18 2148)  vancomycin (VANCOCIN) IVPB 1000 mg/200 mL premix (1,000 mg Intravenous New Bag/Given 01/22/18 0001)  clindamycin (CLEOCIN) IVPB 900 mg (900 mg Intravenous New Bag/Given 01/22/18 0001)  Tdap (BOOSTRIX) injection 0.5 mL (0.5 mLs Intramuscular Given 01/21/18 2357)  iopamidol (ISOVUE-300) 61 % injection 100 mL (100 mLs Intravenous Contrast Given 01/21/18 2253)    Mobility walks

## 2018-01-22 NOTE — Anesthesia Preprocedure Evaluation (Addendum)
Anesthesia Evaluation  Patient identified by MRN, date of birth, ID band Patient awake    Reviewed: Allergy & Precautions, NPO status , Patient's Chart, lab work & pertinent test results  Airway Mallampati: II  TM Distance: >3 FB Neck ROM: Full    Dental  (+) Dental Advisory Given, Missing, Poor Dentition   Pulmonary     + decreased breath sounds      Cardiovascular  Rhythm:Regular Rate:Normal     Neuro/Psych    GI/Hepatic   Endo/Other  diabetes, Type 2  Renal/GU      Musculoskeletal   Abdominal   Peds  Hematology   Anesthesia Other Findings   Reproductive/Obstetrics                            Anesthesia Physical Anesthesia Plan  ASA: III  Anesthesia Plan: General   Post-op Pain Management:    Induction: Intravenous  PONV Risk Score and Plan: Ondansetron  Airway Management Planned: LMA  Additional Equipment:   Intra-op Plan:   Post-operative Plan: Extubation in OR  Informed Consent: I have reviewed the patients History and Physical, chart, labs and discussed the procedure including the risks, benefits and alternatives for the proposed anesthesia with the patient or authorized representative who has indicated his/her understanding and acceptance.   Dental advisory given  Plan Discussed with: CRNA and Anesthesiologist  Anesthesia Plan Comments:        Anesthesia Quick Evaluation

## 2018-01-22 NOTE — ED Notes (Signed)
Report attempted to receiving nurse. Charge nurse will call back when she speaks with the [facility] about the patient coming to the floor.

## 2018-01-22 NOTE — H&P (Signed)
History and Physical    Jeffrey Young JKK:[id number] DOB: [birth year] DOA: 01/21/2018  PCP: System, Pcp Not In  Patient coming from: Home  I have personally briefly reviewed patient's old medical records in [place]  Chief Complaint: Fever, Foot wound  HPI: Jeffrey Young is a 52 y.o. male with medical history significant of DM2.  Patient presents to the ED for 3 day h/o fever, rigors.  In June 2019 he was on vacation, flip-flop on L foot broke and he has puncture wound to bottom of L foot.  It has been slow to heal since then, no increasing erythema or pain.  Does report lack of sensation in BLE for many months now.  Ulceration got larger recently.  Developed fever, chills, rigors for the past 3 days.  Presents to ED.  On ROS does have cough for 3 months as well.  ED Course: Patient treated with IVF, zosyn, vanc, clinda for sepsis and what looks worrisome on imaging studies for possible necrotizing fasciitis with subq emphysema of 2nd toe!  EDP spoke with Dr. Kevan Ny on call for Ortho.  He requested patient be admitted over to [facility] will possibly see patient tonight or early AM.   Review of Systems: As per HPI otherwise 10 point review of systems negative.   Past Medical History:  Diagnosis Date  . Diabetes mellitus without complication ([place])     History reviewed. No pertinent surgical history.   reports that he has never smoked. He has never used smokeless tobacco. He reports that he drinks alcohol. His drug history is not on file.  Allergies  Allergen Reactions  . Bee Venom Swelling    Cold Sweats    No family history on file.   Prior to Admission medications   Medication Sig Start Date End Date Taking? Authorizing Provider  cetirizine (ZYRTEC) 10 MG tablet Take 10 mg by mouth daily as needed for allergies.   Yes [provider]  Insulin Glargine (LANTUS SOLOSTAR) 100 UNIT/ML Solostar Pen Inject 20 Units into the skin at bedtime.   Yes [provider]    Physical Exam: Vitals:   01/21/18 2230 01/21/18 2245 01/21/18 2330 01/22/18 0002  BP: 137/76 128/75 113/67   Pulse: 94 93 90   Resp: (!) 21 (!) 26 (!) 26   Temp:    100.2 F (37.9 C)  TempSrc:    Oral  SpO2: 98% 98% 99%   Weight:      Height:        Constitutional: NAD, calm, comfortable Eyes: PERRL, lids and conjunctivae normal ENMT: Mucous membranes are moist. Posterior pharynx clear of any exudate or lesions.Normal dentition.  Neck: normal, supple, no masses, no thyromegaly Respiratory: clear to auscultation bilaterally, no wheezing, no crackles. Normal respiratory effort. No accessory muscle use.  Cardiovascular: Regular rate and rhythm, no murmurs / rubs / gallops. No extremity edema. 2+ pedal pulses. No carotid bruits.  Abdomen: no tenderness, no masses palpated. No hepatosplenomegaly. Bowel sounds positive.  Musculoskeletal: no clubbing / cyanosis. No joint deformity upper and lower extremities. Good ROM, no contractures. Normal muscle tone.  Skin: Neurologic: CN 2-12 grossly intact. Sensation intact, DTR normal. Strength 5/5 in all 4.  Psychiatric: Normal judgment and insight. Alert and oriented x 3. Normal mood.    Labs on Admission: I have personally reviewed following labs and imaging studies  CBC: Recent Labs  Lab 01/21/18 2119 01/21/18 2135  WBC 16.6*  --   NEUTROABS 14.5*  --  HGB 9.2* 9.9*  HCT 27.9* 29.0*  MCV 88.9  --   PLT 300  --    Basic Metabolic Panel: Recent Labs  Lab 01/21/18 2119 01/21/18 2135  NA 132* 131*  K 4.1 4.0  CL 93* 95*  CO2 24  --   GLUCOSE 214* 219*  BUN 20 21*  CREATININE 1.31* 1.20  CALCIUM 8.9  --    GFR: Estimated Creatinine Clearance: 76.7 mL/min (by C-G formula based on SCr of 1.2 mg/dL). Liver Function Tests: Recent Labs  Lab 01/21/18 2119  AST 19  ALT 14  ALKPHOS 90  BILITOT 1.9*  PROT 8.1  ALBUMIN 3.3*   No results for input(s): LIPASE, AMYLASE in the last 168 hours. No results for  input(s): AMMONIA in the last 168 hours. Coagulation Profile: No results for input(s): INR, PROTIME in the last 168 hours. Cardiac Enzymes: No results for input(s): CKTOTAL, CKMB, CKMBINDEX, TROPONINI in the last 168 hours. BNP (last 3 results) No results for input(s): PROBNP in the last 8760 hours. HbA1C: No results for input(s): HGBA1C in the last 72 hours. CBG: No results for input(s): GLUCAP in the last 168 hours. Lipid Profile: No results for input(s): CHOL, HDL, LDLCALC, TRIG, CHOLHDL, LDLDIRECT in the last 72 hours. Thyroid Function Tests: No results for input(s): TSH, T4TOTAL, FREET4, T3FREE, THYROIDAB in the last 72 hours. Anemia Panel: No results for input(s): VITAMINB12, FOLATE, FERRITIN, TIBC, IRON, RETICCTPCT in the last 72 hours. Urine analysis: No results found for: COLORURINE, APPEARANCEUR, LABSPEC, [place], GLUCOSEU, HGBUR, BILIRUBINUR, KETONESUR, PROTEINUR, UROBILINOGEN, NITRITE, LEUKOCYTESUR  Radiological Exams on Admission: Dg Chest 2 View  Result Date: 01/21/2018 CLINICAL DATA:  Cough. EXAM: CHEST - 2 VIEW COMPARISON:  November 27, 2005 FINDINGS: The heart size and mediastinal contours are within normal limits. Both lungs are clear. The visualized skeletal structures are unremarkable. IMPRESSION: No active cardiopulmonary disease. Electronically Signed   By: Dorise Bullion III M.D   On: 01/21/2018 21:04   Ct Tibia Fibula Left W Contrast  Addendum Date: 01/22/2018   ADDENDUM REPORT: 01/22/2018 00:00 ADDENDUM: Were clinical detail has been given. There is a soft tissue ulceration along the plantar aspect of the forefoot at the base of the second toe however, this does not appear directly contiguous with the soft tissue emphysema seen in the webspace and about the second digit which is more dorsal in position. Therefore, the possibility of a necrotizing fasciitis should be considered. These results were called by telephone at the time of interpretation on 01/21/2018 at 11:58 pm  to [place] , who verbally acknowledged these results. Electronically Signed   By: Ashley Royalty M.D.   On: 01/22/2018 00:00   Result Date: 01/22/2018 CLINICAL DATA:  52 year old male diabetic with left foot ulcer from burn to his left foot on sand at the [facility] a few months ago. Patient noticed some bleeding at the wound. EXAM: CT OF THE LOWER LEFT EXTREMITY WITH CONTRAST; CT LEFT ANKLE WITH CONTRAST; CT LEFT FOOT WITH CONTRAST TECHNIQUE: Multidetector CT imaging of the lower left extremity was performed according to the standard protocol following intravenous contrast administration. COMPARISON:  None. CONTRAST:  148mL ISOVUE-300 IOPAMIDOL (ISOVUE-300) INJECTION 61% FINDINGS: Imaging of the right lower extremity from mid patellofemoral compartment through the toes after IV contrast. Soft tissue and bone windows are provided with reformats. Soft tissues and bones: Soft tissue emphysema is identified over the dorsum and medial aspect of the second toe as well as within the first webspace extending  to the MTP joint level. No drainable fluid collection or abscess. No acute fracture or frank bone destruction is identified of the adjacent osseous structures, namely the first, second and third toes. Subchondral cyst of the first metatarsal head with sclerosis likely on the basis of osteoarthritis is identified. Intact midfoot and Lisfranc articulation. 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 The tibiotalar and subtalar joints are intact. No abnormality of the talar dome, tibial plafond, tibia nor fibula. The included femoral condyles are unremarkable. The included patella is intact with slight joint space narrowing of the lateral patellofemoral compartment of the knee. There is mild subcutaneous soft tissue induration left leg, ankle and foot without drainable soft tissue fluid collections. No abscess is visualized. Tendons and muscles: The extensor and flexor tendons crossing the ankle  joint demonstrate no acute appearing abnormalities. No pyomyositis, intramuscular abscess nor significant atrophy of the musculature. Ligaments: Noncontributory and not well visualized on CT. IMPRESSION: Subcutaneous emphysema and soft tissue swelling involving the first webspace of the left foot and dorsomedial aspect of the left second toe. No adjacent bone destruction suggestive of osteomyelitis. No drainable fluid collections. Mild degenerative change along the dorsum of the talonavicular joint, first MTP and lateral patellofemoral compartment of the included knee. No acute osseous abnormality. Mild diffuse soft tissue induration compatible cellulitis of the leg, ankle and foot. No drainable abscess or fluid collections. Electronically Signed: By: Ashley Royalty M.D. On: 01/21/2018 23:40   Ct Ankle Left W Contrast  Addendum Date: 01/22/2018   ADDENDUM REPORT: 01/22/2018 00:00 ADDENDUM: Were clinical detail has been given. There is a soft tissue ulceration along the plantar aspect of the forefoot at the base of the second toe however, this does not appear directly contiguous with the soft tissue emphysema seen in the webspace and about the second digit which is more dorsal in position. Therefore, the possibility of a necrotizing fasciitis should be considered. These results were called by telephone at the time of interpretation on 01/21/2018 at 11:58 pm to [place] , who verbally acknowledged these results. Electronically Signed   By: Ashley Royalty M.D.   On: 01/22/2018 00:00   Result Date: 01/22/2018 CLINICAL DATA:  52 year old male diabetic with left foot ulcer from burn to his left foot on sand at the [facility] a few months ago. Patient noticed some bleeding at the wound. EXAM: CT OF THE LOWER LEFT EXTREMITY WITH CONTRAST; CT LEFT ANKLE WITH CONTRAST; CT LEFT FOOT WITH CONTRAST TECHNIQUE: Multidetector CT imaging of the lower left extremity was performed according to the standard protocol following intravenous  contrast administration. COMPARISON:  None. CONTRAST:  138mL ISOVUE-300 IOPAMIDOL (ISOVUE-300) INJECTION 61% FINDINGS: Imaging of the right lower extremity from mid patellofemoral compartment through the toes after IV contrast. Soft tissue and bone windows are provided with reformats. Soft tissues and bones: Soft tissue emphysema is identified over the dorsum and medial aspect of the second toe as well as within the first webspace extending to the MTP joint level. No drainable fluid collection or abscess. No acute fracture or frank bone destruction is identified of the adjacent osseous structures, namely the first, second and third toes. Subchondral cyst of the first metatarsal head with sclerosis likely on the basis of osteoarthritis is identified. Intact midfoot and Lisfranc articulation. 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 The tibiotalar and subtalar joints are intact. No abnormality of the talar dome, tibial plafond, tibia nor fibula. The included femoral condyles are unremarkable. The included patella  is intact with slight joint space narrowing of the lateral patellofemoral compartment of the knee. There is mild subcutaneous soft tissue induration left leg, ankle and foot without drainable soft tissue fluid collections. No abscess is visualized. Tendons and muscles: The extensor and flexor tendons crossing the ankle joint demonstrate no acute appearing abnormalities. No pyomyositis, intramuscular abscess nor significant atrophy of the musculature. Ligaments: Noncontributory and not well visualized on CT. IMPRESSION: Subcutaneous emphysema and soft tissue swelling involving the first webspace of the left foot and dorsomedial aspect of the left second toe. No adjacent bone destruction suggestive of osteomyelitis. No drainable fluid collections. Mild degenerative change along the dorsum of the talonavicular joint, first MTP and lateral patellofemoral compartment of the included  knee. No acute osseous abnormality. Mild diffuse soft tissue induration compatible cellulitis of the leg, ankle and foot. No drainable abscess or fluid collections. Electronically Signed: By: Ashley Royalty M.D. On: 01/21/2018 23:40   Ct Foot Left W Contrast  Addendum Date: 01/22/2018   ADDENDUM REPORT: 01/22/2018 00:00 ADDENDUM: Were clinical detail has been given. There is a soft tissue ulceration along the plantar aspect of the forefoot at the base of the second toe however, this does not appear directly contiguous with the soft tissue emphysema seen in the webspace and about the second digit which is more dorsal in position. Therefore, the possibility of a necrotizing fasciitis should be considered. These results were called by telephone at the time of interpretation on 01/21/2018 at 11:58 pm to [place] , who verbally acknowledged these results. Electronically Signed   By: Ashley Royalty M.D.   On: 01/22/2018 00:00   Result Date: 01/22/2018 CLINICAL DATA:  52 year old male diabetic with left foot ulcer from burn to his left foot on sand at the [facility] a few months ago. Patient noticed some bleeding at the wound. EXAM: CT OF THE LOWER LEFT EXTREMITY WITH CONTRAST; CT LEFT ANKLE WITH CONTRAST; CT LEFT FOOT WITH CONTRAST TECHNIQUE: Multidetector CT imaging of the lower left extremity was performed according to the standard protocol following intravenous contrast administration. COMPARISON:  None. CONTRAST:  160mL ISOVUE-300 IOPAMIDOL (ISOVUE-300) INJECTION 61% FINDINGS: Imaging of the right lower extremity from mid patellofemoral compartment through the toes after IV contrast. Soft tissue and bone windows are provided with reformats. Soft tissues and bones: Soft tissue emphysema is identified over the dorsum and medial aspect of the second toe as well as within the first webspace extending to the MTP joint level. No drainable fluid collection or abscess. No acute fracture or frank bone destruction is identified of  the adjacent osseous structures, namely the first, second and third toes. Subchondral cyst of the first metatarsal head with sclerosis likely on the basis of osteoarthritis is identified. Intact midfoot and Lisfranc articulation. 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 The tibiotalar and subtalar joints are intact. No abnormality of the talar dome, tibial plafond, tibia nor fibula. The included femoral condyles are unremarkable. The included patella is intact with slight joint space narrowing of the lateral patellofemoral compartment of the knee. There is mild subcutaneous soft tissue induration left leg, ankle and foot without drainable soft tissue fluid collections. No abscess is visualized. Tendons and muscles: The extensor and flexor tendons crossing the ankle joint demonstrate no acute appearing abnormalities. No pyomyositis, intramuscular abscess nor significant atrophy of the musculature. Ligaments: Noncontributory and not well visualized on CT. IMPRESSION: Subcutaneous emphysema and soft tissue swelling involving the first webspace of the left foot and dorsomedial  aspect of the left second toe. No adjacent bone destruction suggestive of osteomyelitis. No drainable fluid collections. Mild degenerative change along the dorsum of the talonavicular joint, first MTP and lateral patellofemoral compartment of the included knee. No acute osseous abnormality. Mild diffuse soft tissue induration compatible cellulitis of the leg, ankle and foot. No drainable abscess or fluid collections. Electronically Signed: By: Ashley Royalty M.D. On: 01/21/2018 23:40   Dg Foot Complete Left  Result Date: 01/21/2018 CLINICAL DATA:  Ulcer on left foot.  History of diabetes. EXAM: LEFT FOOT - COMPLETE 3+ VIEW COMPARISON:  None. FINDINGS: There is air in the soft tissues of the second toe and in the webbing between the first and second toes. No associated bony erosion. IMPRESSION: Air in the soft tissues of  the second toe in the webbing between the first and second toes is concerning for infection with a gas-forming organism. No underlying bony erosion identified. Electronically Signed   By: Dorise Bullion III M.D   On: 01/21/2018 21:06    EKG: Independently reviewed.  Assessment/Plan Principal Problem:   Necrotizing fasciitis of ankle and foot (HCC) Active Problems:   DM2 (diabetes mellitus, type 2) ([place])   Sepsis ([place])   Diabetic ulcer of left foot ([place])    1. Sepsis, diabetic foot ulcer of L foot - imaging findings and clinical picture is worrisome for possible early Necrotizing soft tissue infection! 1. Diabetic foot ulcer pathway 2. Continue zosyn, vanc, clinda 3. BCx pending 4. Repeat labs in AM 5. Dr. Kevan Ny consulted by EDP - 1. [facility]s [facility] 2. Call Dr. Kevan Ny on patient arrival (have directly spoken to RN to state this in sign out, also have put in RN communication order). 6. Keeping patient NPO for possible OR 7. Have spoken with bed placement to expedite bed assignment (already has SDU bed assigned on 6E at [facility]). 8. IVF: 2.5L bolus and 125cc/hr ns 2. DM2 - 1. Sensitive SSI Q4H 2. Lantus 10 QHS  DVT prophylaxis: SCDs - likely may need surgery Code Status: Full Family Communication: Family at bedside Disposition Plan: Home after admit Consults called: Dr. Kevan Ny - Ortho Admission status: Admit to inpatient - IP status for sepsis with picture worrisome for possible NSTI   Jaimi Belle, [place] [facility] Pager [phone] Only works nights!  If 7AM-7PM, please contact the primary day team physician taking care of patient  [email or web address] Password [email or web address]  01/22/2018, 1:24 AM

## 2018-01-22 NOTE — Anesthesia Procedure Notes (Signed)
Procedure Name: LMA Insertion Date/Time: 01/22/2018 9:43 AM Performed by: Harden Mo, CRNA Pre-anesthesia Checklist: Patient identified, Emergency Drugs available, Suction available and Patient being monitored Patient Re-evaluated:Patient Re-evaluated prior to induction Oxygen Delivery Method: Circle System Utilized Preoxygenation: Pre-oxygenation with 100% oxygen Induction Type: IV induction LMA: LMA inserted LMA Size: 5.0 Number of attempts: 1 Airway Equipment and Method: Bite block Placement Confirmation: positive ETCO2 Tube secured with: Tape Dental Injury: Teeth and Oropharynx as per pre-operative assessment

## 2018-01-22 NOTE — Progress Notes (Signed)
[place] TEAM 1 - Stepdown/ICU TEAM  Jeffrey Young  Jeffrey Young DOB: 1966-03-29 DOA: 01/21/2018 PCP: System, Pcp Not In    Brief Narrative:  52yo M w/ a hx of DM2 who presented to the ED w/ a 3 day h/o rigors.  In June 2019 he was on vacation, flip-flop on L foot broke, and he suffered a puncture wound to bottom of L foot.  It has been slow to heal since then, and recently got larger.    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  The pt was transferred from [facility] to [facility] for prompt Ortho evaluation, and taken to the OR emergently 01/22/18 AM.  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 rate 18, height 6\' 1"  (1.854 m), weight 77.1 kg, SpO2 98 %.  Intake/Output Summary (Last 24 hours) at 01/22/2018 1505 Last data filed at 01/22/2018 1250 Gross per 24 hour  Intake 2972.8 ml  Output 800 ml  Net 2172.8 ml   Filed Weights   01/21/18 2102  Weight: 77.1 kg    Examination: Pt was seen for a f/u visit.    CBC: Recent Labs  Lab 01/21/18 2119 01/21/18 2135  WBC 16.6*  --   NEUTROABS 14.5*  --   HGB 9.2* 9.9*  HCT 27.9* 29.0*  MCV 88.9  --   PLT 300  --    Basic Metabolic Panel: Recent Labs  Lab 01/21/18 2119 01/21/18 2135  NA 132* 131*  K 4.1 4.0  CL 93* 95*  CO2 24  --   GLUCOSE 214* 219*  BUN 20 21*  CREATININE 1.31* 1.20  CALCIUM 8.9  --    GFR: Estimated Creatinine Clearance:  76.7 mL/min (by C-G formula based on SCr of 1.2 mg/dL).  Liver Function Tests: Recent Labs  Lab 01/21/18 2119  AST 19  ALT 14  ALKPHOS 90  BILITOT 1.9*  PROT 8.1  ALBUMIN 3.3*    CBG: Recent Labs  Lab 01/22/18 0236 01/22/18 0443 01/22/18 0745 01/22/18 1022 01/22/18 1130  GLUCAP 173* 126* 144* 135* 163*    Recent Results (from the past 240 hour(s))  Blood Culture (routine x 2)     Status: None (Preliminary result)   Collection Time: 01/21/18  9:19 PM  Result Value Ref Range Status   Specimen Description   Final    BLOOD LEFT ANTECUBITAL Performed at [facility], [place] [address] [place], [place] [address]    Special Requests   Final    BOTTLES DRAWN AEROBIC AND ANAEROBIC Blood Culture results may not be optimal due to an inadequate volume of blood received in culture bottles Performed at [facility] [address], [place], [place] [address]    Culture   Final    NO GROWTH < 24 HOURS Performed at [facility] [address], [place], [place] [address]    Report Status  PENDING  Incomplete  Blood Culture (routine x 2)     Status: None (Preliminary result)   Collection Time: 01/21/18  9:40 PM  Result Value Ref Range Status   Specimen Description   Final    BLOOD RIGHT ANTECUBITAL Performed at [facility] [address], [place], [place] [address]    Special Requests   Final    BOTTLES DRAWN AEROBIC AND ANAEROBIC Blood Culture adequate volume Performed at [facility] [address] [place], [place] [address]    Culture   Final    NO GROWTH < 24 HOURS Performed at [facility] [address] [place], [place] [address]    Report Status PENDING  Incomplete  MRSA PCR Screening     Status: Abnormal   Collection Time: 01/22/18  4:33 AM  Result Value Ref Range Status   MRSA by PCR POSITIVE (A) NEGATIVE Final    Comment:        The GeneXpert MRSA Assay (FDA approved for  NASAL specimens only), is one component of a comprehensive MRSA colonization surveillance program. It is not intended to diagnose MRSA infection nor to guide or monitor treatment for MRSA infections. RESULT CALLED TO, READ BACK BY AND VERIFIED WITHStarlyn Skeans RN 2297 01/22/18 A BROWNING Performed at [facility], [place] [address] [place], [place] [address]   Aerobic/Anaerobic Culture (surgical/deep wound)     Status: None (Preliminary result)   Collection Time: 01/22/18 10:07 AM  Result Value Ref Range Status   Specimen Description ABSCESS LEFT FOOT MIDFOOT PLANTAR  Final   Special Requests NONE  Final   Gram Stain   Final    RARE WBC PRESENT, PREDOMINANTLY PMN MODERATE GRAM POSITIVE COCCI MODERATE GRAM NEGATIVE RODS FEW GRAM POSITIVE RODS Performed at [facility], [place] [address] [place],  [address]    Culture PENDING  Incomplete   Report Status PENDING  Incomplete     Scheduled Meds: . docusate sodium  100 mg Oral BID  . insulin aspart  0-9 Units Subcutaneous Q4H  . insulin glargine  10 Units Subcutaneous QHS  . mupirocin ointment   Nasal BID   Continuous Infusions: . sodium chloride 1,000 mL (01/22/18 1145)  . sodium chloride 10 mL/hr at 01/22/18 1150  . clindamycin (CLEOCIN) IV 900 mg (01/22/18 1311)  . methocarbamol (ROBAXIN) IV    . piperacillin-tazobactam (ZOSYN)  IV 3.375 g (01/22/18 1402)  . vancomycin Stopped (01/22/18 1158)     LOS: 0 days   Time spent: No Charge  Cherene Altes, MD [facility] Office  [phone] Pager - Text Page per Amion as per below:  On-Call/Text Page:      [email or web address]  If 7PM-7AM, please contact night-coverage [email or web address] 01/22/2018, 3:05 PM

## 2018-01-22 NOTE — Consult Note (Signed)
ORTHOPAEDIC CONSULTATION  REQUESTING PHYSICIAN: Cherene Altes, MD  Chief Complaint: Left foot purulent draining ulcer for 3 months.    HPI: Jeffrey Young is a 52 y.o. male who presents with a 80-month history of ulceration plantar aspect left foot.  Patient states he has had uncontrolled type 2 diabetes and his most recent hemoglobin A1c is down to a 10.  Patient has been trying to care for the wound himself.  Past Medical History:  Diagnosis Date  . Diabetes mellitus without complication ([place])    History reviewed. No pertinent surgical history. Social History   Socioeconomic History  . Marital status: Married    Spouse name: Not on file  . Number of children: Not on file  . Years of education: Not on file  . Highest education level: Not on file  Occupational History  . Not on file  Social Needs  . Financial resource strain: Not on file  . Food insecurity:    Worry: Not on file    Inability: Not on file  . Transportation needs:    Medical: Not on file    Non-medical: Not on file  Tobacco Use  . Smoking status: Never Smoker  . Smokeless tobacco: Never Used  Substance and Sexual Activity  . Alcohol use: Yes  . Drug use: Not on file  . Sexual activity: Not on file  Lifestyle  . Physical activity:    Days per week: Not on file    Minutes per session: Not on file  . Stress: Not on file  Relationships  . Social connections:    Talks on phone: Not on file    Gets together: Not on file    Attends religious service: Not on file    Active member of club or organization: Not on file    Attends meetings of clubs or organizations: Not on file    Relationship status: Not on file  Other Topics Concern  . Not on file  Social History Narrative  . Not on file   History reviewed. No pertinent family history. - negative except otherwise stated in the family history section Allergies  Allergen Reactions  . Bee Venom Swelling    Cold Sweats   Prior to Admission  medications   Medication Sig Start Date End Date Taking? Authorizing Provider  cetirizine (ZYRTEC) 10 MG tablet Take 10 mg by mouth daily as needed for allergies.   Yes [provider]  Insulin Glargine (LANTUS SOLOSTAR) 100 UNIT/ML Solostar Pen Inject 20 Units into the skin at bedtime.   Yes [provider]   Dg Chest 2 View  Result Date: 01/21/2018 CLINICAL DATA:  Cough. EXAM: CHEST - 2 VIEW COMPARISON:  November 27, 2005 FINDINGS: The heart size and mediastinal contours are within normal limits. Both lungs are clear. The visualized skeletal structures are unremarkable. IMPRESSION: No active cardiopulmonary disease. Electronically Signed   By: Dorise Bullion III M.D   On: 01/21/2018 21:04   Ct Tibia Fibula Left W Contrast  Addendum Date: 01/22/2018   ADDENDUM REPORT: 01/22/2018 00:00 ADDENDUM: Were clinical detail has been given. There is a soft tissue ulceration along the plantar aspect of the forefoot at the base of the second toe however, this does not appear directly contiguous with the soft tissue emphysema seen in the webspace and about the second digit which is more dorsal in position. Therefore, the possibility of a necrotizing fasciitis should be considered. These results were called by telephone at the time  of interpretation on 01/21/2018 at 11:58 pm to [place] , who verbally acknowledged these results. Electronically Signed   By: Ashley Royalty M.D.   On: 01/22/2018 00:00   Result Date: 01/22/2018 CLINICAL DATA:  52 year old male diabetic with left foot ulcer from burn to his left foot on sand at the [facility] a few months ago. Patient noticed some bleeding at the wound. EXAM: CT OF THE LOWER LEFT EXTREMITY WITH CONTRAST; CT LEFT ANKLE WITH CONTRAST; CT LEFT FOOT WITH CONTRAST TECHNIQUE: Multidetector CT imaging of the lower left extremity was performed according to the standard protocol following intravenous contrast administration. COMPARISON:  None. CONTRAST:  189mL ISOVUE-300  IOPAMIDOL (ISOVUE-300) INJECTION 61% FINDINGS: Imaging of the right lower extremity from mid patellofemoral compartment through the toes after IV contrast. Soft tissue and bone windows are provided with reformats. Soft tissues and bones: Soft tissue emphysema is identified over the dorsum and medial aspect of the second toe as well as within the first webspace extending to the MTP joint level. No drainable fluid collection or abscess. No acute fracture or frank bone destruction is identified of the adjacent osseous structures, namely the first, second and third toes. Subchondral cyst of the first metatarsal head with sclerosis likely on the basis of osteoarthritis is identified. Intact midfoot and Lisfranc articulation. 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 The tibiotalar and subtalar joints are intact. No abnormality of the talar dome, tibial plafond, tibia nor fibula. The included femoral condyles are unremarkable. The included patella is intact with slight joint space narrowing of the lateral patellofemoral compartment of the knee. There is mild subcutaneous soft tissue induration left leg, ankle and foot without drainable soft tissue fluid collections. No abscess is visualized. Tendons and muscles: The extensor and flexor tendons crossing the ankle joint demonstrate no acute appearing abnormalities. No pyomyositis, intramuscular abscess nor significant atrophy of the musculature. Ligaments: Noncontributory and not well visualized on CT. IMPRESSION: Subcutaneous emphysema and soft tissue swelling involving the first webspace of the left foot and dorsomedial aspect of the left second toe. No adjacent bone destruction suggestive of osteomyelitis. No drainable fluid collections. Mild degenerative change along the dorsum of the talonavicular joint, first MTP and lateral patellofemoral compartment of the included knee. No acute osseous abnormality. Mild diffuse soft tissue induration  compatible cellulitis of the leg, ankle and foot. No drainable abscess or fluid collections. Electronically Signed: By: Ashley Royalty M.D. On: 01/21/2018 23:40   Ct Ankle Left W Contrast  Addendum Date: 01/22/2018   ADDENDUM REPORT: 01/22/2018 00:00 ADDENDUM: Were clinical detail has been given. There is a soft tissue ulceration along the plantar aspect of the forefoot at the base of the second toe however, this does not appear directly contiguous with the soft tissue emphysema seen in the webspace and about the second digit which is more dorsal in position. Therefore, the possibility of a necrotizing fasciitis should be considered. These results were called by telephone at the time of interpretation on 01/21/2018 at 11:58 pm to [place] , who verbally acknowledged these results. Electronically Signed   By: Ashley Royalty M.D.   On: 01/22/2018 00:00   Result Date: 01/22/2018 CLINICAL DATA:  52 year old male diabetic with left foot ulcer from burn to his left foot on sand at the [facility] a few months ago. Patient noticed some bleeding at the wound. EXAM: CT OF THE LOWER LEFT EXTREMITY WITH CONTRAST; CT LEFT ANKLE WITH CONTRAST; CT LEFT FOOT WITH CONTRAST TECHNIQUE: Multidetector  CT imaging of the lower left extremity was performed according to the standard protocol following intravenous contrast administration. COMPARISON:  None. CONTRAST:  153mL ISOVUE-300 IOPAMIDOL (ISOVUE-300) INJECTION 61% FINDINGS: Imaging of the right lower extremity from mid patellofemoral compartment through the toes after IV contrast. Soft tissue and bone windows are provided with reformats. Soft tissues and bones: Soft tissue emphysema is identified over the dorsum and medial aspect of the second toe as well as within the first webspace extending to the MTP joint level. No drainable fluid collection or abscess. No acute fracture or frank bone destruction is identified of the adjacent osseous structures, namely the first, second and third  toes. Subchondral cyst of the first metatarsal head with sclerosis likely on the basis of osteoarthritis is identified. Intact midfoot and Lisfranc articulation. 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 The tibiotalar and subtalar joints are intact. No abnormality of the talar dome, tibial plafond, tibia nor fibula. The included femoral condyles are unremarkable. The included patella is intact with slight joint space narrowing of the lateral patellofemoral compartment of the knee. There is mild subcutaneous soft tissue induration left leg, ankle and foot without drainable soft tissue fluid collections. No abscess is visualized. Tendons and muscles: The extensor and flexor tendons crossing the ankle joint demonstrate no acute appearing abnormalities. No pyomyositis, intramuscular abscess nor significant atrophy of the musculature. Ligaments: Noncontributory and not well visualized on CT. IMPRESSION: Subcutaneous emphysema and soft tissue swelling involving the first webspace of the left foot and dorsomedial aspect of the left second toe. No adjacent bone destruction suggestive of osteomyelitis. No drainable fluid collections. Mild degenerative change along the dorsum of the talonavicular joint, first MTP and lateral patellofemoral compartment of the included knee. No acute osseous abnormality. Mild diffuse soft tissue induration compatible cellulitis of the leg, ankle and foot. No drainable abscess or fluid collections. Electronically Signed: By: Ashley Royalty M.D. On: 01/21/2018 23:40   Ct Foot Left W Contrast  Addendum Date: 01/22/2018   ADDENDUM REPORT: 01/22/2018 00:00 ADDENDUM: Were clinical detail has been given. There is a soft tissue ulceration along the plantar aspect of the forefoot at the base of the second toe however, this does not appear directly contiguous with the soft tissue emphysema seen in the webspace and about the second digit which is more dorsal in position.  Therefore, the possibility of a necrotizing fasciitis should be considered. These results were called by telephone at the time of interpretation on 01/21/2018 at 11:58 pm to [place] , who verbally acknowledged these results. Electronically Signed   By: Ashley Royalty M.D.   On: 01/22/2018 00:00   Result Date: 01/22/2018 CLINICAL DATA:  52 year old male diabetic with left foot ulcer from burn to his left foot on sand at the [facility] a few months ago. Patient noticed some bleeding at the wound. EXAM: CT OF THE LOWER LEFT EXTREMITY WITH CONTRAST; CT LEFT ANKLE WITH CONTRAST; CT LEFT FOOT WITH CONTRAST TECHNIQUE: Multidetector CT imaging of the lower left extremity was performed according to the standard protocol following intravenous contrast administration. COMPARISON:  None. CONTRAST:  151mL ISOVUE-300 IOPAMIDOL (ISOVUE-300) INJECTION 61% FINDINGS: Imaging of the right lower extremity from mid patellofemoral compartment through the toes after IV contrast. Soft tissue and bone windows are provided with reformats. Soft tissues and bones: Soft tissue emphysema is identified over the dorsum and medial aspect of the second toe as well as within the first webspace extending to the MTP joint level. No drainable  fluid collection or abscess. No acute fracture or frank bone destruction is identified of the adjacent osseous structures, namely the first, second and third toes. Subchondral cyst of the first metatarsal head with sclerosis likely on the basis of osteoarthritis is identified. Intact midfoot and Lisfranc articulation. 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 The tibiotalar and subtalar joints are intact. No abnormality of the talar dome, tibial plafond, tibia nor fibula. The included femoral condyles are unremarkable. The included patella is intact with slight joint space narrowing of the lateral patellofemoral compartment of the knee. There is mild subcutaneous soft tissue induration left  leg, ankle and foot without drainable soft tissue fluid collections. No abscess is visualized. Tendons and muscles: The extensor and flexor tendons crossing the ankle joint demonstrate no acute appearing abnormalities. No pyomyositis, intramuscular abscess nor significant atrophy of the musculature. Ligaments: Noncontributory and not well visualized on CT. IMPRESSION: Subcutaneous emphysema and soft tissue swelling involving the first webspace of the left foot and dorsomedial aspect of the left second toe. No adjacent bone destruction suggestive of osteomyelitis. No drainable fluid collections. Mild degenerative change along the dorsum of the talonavicular joint, first MTP and lateral patellofemoral compartment of the included knee. No acute osseous abnormality. Mild diffuse soft tissue induration compatible cellulitis of the leg, ankle and foot. No drainable abscess or fluid collections. Electronically Signed: By: Ashley Royalty M.D. On: 01/21/2018 23:40   Mr Foot Left W Wo Contrast  Result Date: 01/22/2018 CLINICAL DATA:  Diabetes, fever.  Puncture wound in June 2019. EXAM: MRI OF THE LEFT FOREFOOT WITHOUT AND WITH CONTRAST TECHNIQUE: Multiplanar, multisequence MR imaging of the left forefoot was performed both before and after administration of intravenous contrast. CONTRAST:  29mL MULTIHANCE GADOBENATE DIMEGLUMINE 529 MG/ML IV SOLN COMPARISON:  01/21/2018 FINDINGS: Bones/Joint/Cartilage Abnormal accentuated T2 signal and enhancement in the marrow of the phalanges of the second toe (especially the proximal phalanx) and in the distal phalanx of the great toe compatible with osteomyelitis. Ligaments Lisfranc ligament intact. Muscles and Tendons Low-level edema and enhancement along the first and second intermetatarsal interosseous muscles. Lesser degree of edema and enhancement in the flexor digitorum brevis muscle. Soft tissues Plantar ulceration below the base of the second toe as on image 11/5 with underlying  inflammatory phlegmon. Abnormal gas tracking in the second toe and along the web space between the first and second toes, as on recent CT scan. Abnormal subcutaneous edema and enhancement in the dorsum of the foot compatible with cellulitis. IMPRESSION: 1. Osteomyelitis the phalanges of the second toe (especially the proximal phalanx) and of the distal phalanx great toe. 2. Plantar ulceration below the base of the second toe with underlying phlegmon, with soft tissue hypoenhancement and gas tracking in the second toe and in the webspace between the first and second toes. Appearance compatible with cellulitis and gas-forming infection with soft tissue necrosis/fasciitis along the ulcer and tracking in the webspace and second toe. 3. Cellulitis along the dorsum of the foot. Potential myositis along the interosseous muscles and plantar musculature of the foot, although some of the edematous appearance in this vicinity could be neurogenic. Electronically Signed   By: Van Clines M.D.   On: 01/22/2018 07:30   Dg Foot Complete Left  Result Date: 01/21/2018 CLINICAL DATA:  Ulcer on left foot.  History of diabetes. EXAM: LEFT FOOT - COMPLETE 3+ VIEW COMPARISON:  None. FINDINGS: There is air in the soft tissues of the second toe and in the webbing between the  first and second toes. No associated bony erosion. IMPRESSION: Air in the soft tissues of the second toe in the webbing between the first and second toes is concerning for infection with a gas-forming organism. No underlying bony erosion identified. Electronically Signed   By: Dorise Bullion III M.D   On: 01/21/2018 21:06   - pertinent xrays, CT, MRI studies were reviewed and independently interpreted  Positive ROS: All other systems have been reviewed and were otherwise negative with the exception of those mentioned in the HPI and as above.  Physical Exam: General: Alert, no acute distress Psychiatric: Patient is competent for consent with normal  mood and affect Lymphatic: No axillary or cervical lymphadenopathy Cardiovascular: No pedal edema Respiratory: No cyanosis, no use of accessory musculature GI: No organomegaly, abdomen is soft and non-tender    Images:  @ENCIMAGES @  Labs:  No results found for: HGBA1C, ESRSEDRATE, CRP, LABURIC, REPTSTATUS, GRAMSTAIN, CULT, LABORGA  Lab Results  Component Value Date   ALBUMIN 3.3 (L) 01/21/2018    Neurologic: Patient does not have protective sensation bilateral lower extremities.   MUSCULOSKELETAL:   Skin: Examination patient has purulent draining ulcer beneath the second and third metatarsal heads left foot.  There is dermatitis and cellulitis throughout the forefoot.  Patient has a strong dorsalis pedis pulse bilaterally.  Patient has no ulcers or swelling in the right lower extremity left lower extremity has cellulitis of the forefoot swelling he has a good pulse he has an ulcer of the probes down to the second and third metatarsal heads.  Review of the radiographs shows no destructive bony changes.  Review of the CT scan shows area of soft tissue both plantarly and dorsally.  Review of the MRI scan is suggestive of osteomyelitis of the forefoot.  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  Plan: Plan: Discussed with the patient and his wife his best option for foot salvage is a transmetatarsal amputation.  Risks and benefits of surgery were discussed including persistent infection potential for higher level amputation.  Patient states he understands wished to proceed at this time.  Patient has been n.p.o. due to his nausea and vomiting from systemic symptoms.  He we will plan for a left transmetatarsal amputation this morning at 1130.  Patient to continue n.p.o. status.  Thank you for the consult and the opportunity to see Jeffrey Young, [place] [phone] 7:39  AM

## 2018-01-22 NOTE — Progress Notes (Signed)
Orthopedic Tech Progress Note Patient Details:  Jeffrey Young 07/09/1965 [id number]  Ortho Devices Type of Ortho Device: Postop shoe/boot Ortho Device/Splint Interventions: Application   Post Interventions Patient Tolerated: Well Instructions Provided: Care of device   [place] Pink 01/22/2018, 11:59 AM

## 2018-01-22 NOTE — Transfer of Care (Signed)
Immediate Anesthesia Transfer of Care Note  Patient: Jeffrey Young  Procedure(s) Performed: TRANSMETATARSAL AMPUTATION (Left )  Patient Location: PACU  Anesthesia Type:General  Level of Consciousness: awake and alert   Airway & Oxygen Therapy: Patient Spontanous Breathing  Post-op Assessment: Report given to RN, Post -op Vital signs reviewed and stable and Patient moving all extremities X 4  Post vital signs: Reviewed and stable  Last Vitals:  Vitals Value Taken Time  BP 105/63 01/22/2018 10:21 AM  Temp    Pulse 80 01/22/2018 10:22 AM  Resp 11 01/22/2018 10:22 AM  SpO2 97 % 01/22/2018 10:22 AM  Vitals shown include unvalidated device data.  Last Pain:  Vitals:   01/22/18 0512  TempSrc:   PainSc: 0-No pain         Complications: No apparent anesthesia complications

## 2018-01-22 NOTE — Progress Notes (Signed)
PT Cancellation Note  Patient Details Name: Jeffrey Young MRN: [id number] DOB: 1965/07/09   Cancelled Treatment:    Reason Eval/Treat Not Completed: Medical issues which prohibited therapy(pt had surgery today, will evaluate tomorrow. )   Philomena Doheny 01/22/2018, 1:06 PM [phone]

## 2018-01-22 NOTE — Progress Notes (Signed)
Pharmacy Antibiotic Note  Jeffrey Young is a 52 y.o. male admitted on 01/21/2018 with foot infection .  Pharmacy has been consulted for zosyn and vancomycin dosing.  Plan: Zosyn 3.375g IV q8h (4 hour infusion).  Vancomycin 1 Gm x1 then 750 gm IV q12h for est AUC = 427 Goal AUC = 400-500 Daily scr F/u cultures and levels  Clindamycin 900 mg IV q8h (MD)  Height: 6\' 1"  (185.4 cm) Weight: 170 lb (77.1 kg) IBW/kg (Calculated) : 79.9  Temp (24hrs), Avg:100.8 F (38.2 C), Min:99.8 F (37.7 C), Max:103 F (39.4 C)  Recent Labs  Lab 01/21/18 2119 01/21/18 2135 01/21/18 2148 01/22/18 0015  WBC 16.6*  --   --   --   CREATININE 1.31* 1.20  --   --   LATICACIDVEN  --   --  1.35 1.11    Estimated Creatinine Clearance: 76.7 mL/min (by C-G formula based on SCr of 1.2 mg/dL).    Allergies  Allergen Reactions  . Bee Venom Swelling    Cold Sweats    Antimicrobials this admission: 9/2 zosyn >>  9/2 clindamycin >>  9/2 vancomycin >>  Dose adjustments this admission:   Microbiology results:  BCx:   UCx:    Sputum:    MRSA PCR:   Thank you for allowing pharmacy to be a part of this patient's care.  Lawana Pai R 01/22/2018 1:32 AM

## 2018-01-22 NOTE — Progress Notes (Signed)
Initial Nutrition Assessment  DOCUMENTATION CODES:   Not applicable  INTERVENTION:    Glucerna Shake po BID, each supplement provides 220 kcal and 10 grams of protein  Multivitamin with minerals daily  NUTRITION DIAGNOSIS:   Increased nutrient needs related to wound healing as evidenced by estimated needs.  GOAL:   Patient will meet greater than or equal to 90% of their needs  MONITOR:   PO intake, Supplement acceptance, Labs, I & O's  REASON FOR ASSESSMENT:   Consult Wound healing  ASSESSMENT:   52 yo male with PMH of DM who was admitted on 9/2 with left foot osteomyelitis with abscess and ulceration. S/P transmetatarsal amputation and placement of wound VAC on 9/3.   Patient very sleepy during RD visit. He is groggy S/P surgery this morning. He was unable to state his usual weight, but said he was eating well PTA. Per Advertising copywriter, he consumed 100% of lunch today. Labs reviewed. Sodium 131 (L) CBG's: [phone] Medications reviewed and include Colace, Novolog, Lantus.   NUTRITION - FOCUSED PHYSICAL EXAM:    Most Recent Value  Orbital Region  No depletion  Upper Arm Region  No depletion  Thoracic and Lumbar Region  Unable to assess  Buccal Region  No depletion  Temple Region  Mild depletion  Clavicle Bone Region  Mild depletion  Clavicle and Acromion Bone Region  No depletion  Scapular Bone Region  Unable to assess  Dorsal Hand  Mild depletion  Patellar Region  Mild depletion  Anterior Thigh Region  No depletion  Posterior Calf Region  Mild depletion  Edema (RD Assessment)  None  Hair  Reviewed  Eyes  Reviewed  Mouth  Reviewed  Skin  Reviewed  Nails  Reviewed       Diet Order:   Diet Order            Diet Carb Modified Fluid consistency: Thin; Room service appropriate? Yes  Diet effective now              EDUCATION NEEDS:   Not appropriate for education at this time  Skin:  Skin Assessment: Skin Integrity Issues: Skin Integrity Issues::  Incisions Incisions: L foot transmetatarsal amputation site  Last BM:  9/2  Height:   Ht Readings from Last 1 Encounters:  01/21/18 6\' 1"  (1.854 m)    Weight:   Wt Readings from Last 1 Encounters:  01/21/18 77.1 kg    Ideal Body Weight:  83.6 kg  BMI:  Body mass index is 22.43 kg/m.  Estimated Nutritional Needs:   Kcal:  2200-2400  Protein:  110-125 gm  Fluid:  >/= 2.2 L    Molli Barrows, RD, LDN, [place] Pager [phone] After Hours Pager [phone]

## 2018-01-22 NOTE — Op Note (Signed)
01/21/2018 - 01/22/2018  10:22 AM  PATIENT:  Jeffrey Young    PRE-OPERATIVE DIAGNOSIS:  left foot osteomyelitis, with abscess and ulceration  POST-OPERATIVE DIAGNOSIS:  Same  PROCEDURE:  TRANSMETATARSAL AMPUTATION, application of Praveena wound VAC, cultures obtained.  SURGEON:  Newt Minion, MD  PHYSICIAN ASSISTANT:None ANESTHESIA:   General  PREOPERATIVE INDICATIONS:  [name] is a  52 y.o. male with a diagnosis of left foot osteomyelitis who failed conservative measures and elected for surgical management.    The risks benefits and alternatives were discussed with the patient preoperatively including but not limited to the risks of infection, bleeding, nerve injury, cardiopulmonary complications, the need for revision surgery, among others, and the patient was willing to proceed.  OPERATIVE IMPLANTS: Praveena wound VAC  @ENCIMAGES @  OPERATIVE FINDINGS: Necrotic muscle and fascia extending to the midfoot.  Cultures were obtained of the purulent abscess.  OPERATIVE PROCEDURE: Patient was brought the operating room and underwent a general anesthetic.  After adequate levels of anesthesia were obtained patient's left lower extremity was prepped using DuraPrep draped into a sterile field a timeout was called.  A fishmouth incision was made through the midfoot.  This was carried more proximally dorsally secondary to necrotic muscle fascia on the dorsum of the foot.  The wound was resected back to healthy viable margins.  The wound was irrigated with normal saline electrocautery was used for hemostasis.  The skin was closed using 2-0 nylon a Praveena wound VAC was applied patient was extubated taken to PACU in stable condition.  Patient also wearing a compression wrap from his metatarsals to the tibial tubercle.   DISCHARGE PLANNING:  Antibiotic duration: Patient had an infection that extended up to the surgical margins.  Continue the vanc and Zosyn for 72 hours postoperatively and  then transition to oral antibiotics for 4 weeks as per  the wound cultures.  Weightbearing: Minimize weightbearing left lower extremity  Pain medication: Opioid pathway ordered  Dressing care/ Wound VAC: Discharged with the Praveena plus portable pump.  Ambulatory devices: Walker or crutches  Discharge to: Anticipate discharge to home  Follow-up: In the office 1 week post operative.

## 2018-01-22 NOTE — Anesthesia Postprocedure Evaluation (Signed)
Anesthesia Post Note  Patient: Jeffrey Young  Procedure(s) Performed: TRANSMETATARSAL AMPUTATION (Left Foot)     Patient location during evaluation: PACU Anesthesia Type: General Level of consciousness: awake and alert Pain management: pain level controlled Vital Signs Assessment: post-procedure vital signs reviewed and stable Respiratory status: spontaneous breathing, nonlabored ventilation, respiratory function stable and patient connected to nasal cannula oxygen Cardiovascular status: blood pressure returned to baseline and stable Postop Assessment: no apparent nausea or vomiting Anesthetic complications: no    Last Vitals:  Vitals:   01/22/18 1520 01/22/18 1614  BP: 97/63 118/64  Pulse: 84   Resp: 16   Temp: 37.7 C   SpO2: 97%     Last Pain:  Vitals:   01/22/18 1520  TempSrc: Oral  PainSc: 0-No pain                 Aicha Clingenpeel COKER

## 2018-01-23 ENCOUNTER — Inpatient Hospital Stay: Payer: Self-pay

## 2018-01-23 ENCOUNTER — Encounter (HOSPITAL_COMMUNITY): Payer: Self-pay | Admitting: Orthopedic Surgery

## 2018-01-23 ENCOUNTER — Telehealth (INDEPENDENT_AMBULATORY_CARE_PROVIDER_SITE_OTHER): Payer: Self-pay | Admitting: Orthopedic Surgery

## 2018-01-23 ENCOUNTER — Ambulatory Visit (HOSPITAL_COMMUNITY): Payer: 59

## 2018-01-23 LAB — COMPREHENSIVE METABOLIC PANEL
ALT: 11 U/L (ref 0–44)
AST: 27 U/L (ref 15–41)
Albumin: 2.4 g/dL — ABNORMAL LOW (ref 3.5–5.0)
Alkaline Phosphatase: 84 U/L (ref 38–126)
Anion gap: 8 (ref 5–15)
BUN: 21 mg/dL — ABNORMAL HIGH (ref 6–20)
CO2: 24 mmol/L (ref 22–32)
Calcium: 8.2 mg/dL — ABNORMAL LOW (ref 8.9–10.3)
Chloride: 101 mmol/L (ref 98–111)
Creatinine, Ser: 1.5 mg/dL — ABNORMAL HIGH (ref 0.61–1.24)
GFR calc Af Amer: 59 mL/min — ABNORMAL LOW (ref >60)
GFR calc non Af Amer: 51 mL/min — ABNORMAL LOW (ref >60)
Glucose, Bld: 139 mg/dL — ABNORMAL HIGH (ref 70–99)
Potassium: 4.4 mmol/L (ref 3.5–5.1)
Sodium: 133 mmol/L — ABNORMAL LOW (ref 135–145)
Total Bilirubin: 1.9 mg/dL — ABNORMAL HIGH (ref 0.3–1.2)
Total Protein: 6.5 g/dL (ref 6.5–8.1)

## 2018-01-23 LAB — GLUCOSE, CAPILLARY
Glucose-Capillary: 135 mg/dL — ABNORMAL HIGH (ref 70–99)
Glucose-Capillary: 133 mg/dL — ABNORMAL HIGH (ref 70–99)
Glucose-Capillary: 140 mg/dL — ABNORMAL HIGH (ref 70–99)
Glucose-Capillary: 94 mg/dL (ref 70–99)
Glucose-Capillary: 115 mg/dL — ABNORMAL HIGH (ref 70–99)

## 2018-01-23 LAB — PREALBUMIN: Prealbumin: 6.2 mg/dL — ABNORMAL LOW (ref 18–38)

## 2018-01-23 LAB — C-REACTIVE PROTEIN: CRP: 21.4 mg/dL — ABNORMAL HIGH (ref <1.0)

## 2018-01-23 LAB — HIV ANTIBODY (ROUTINE TESTING W REFLEX): HIV Screen 4th Generation wRfx: NONREACTIVE

## 2018-01-23 LAB — HEMOGLOBIN A1C
Hgb A1c MFr Bld: 7.2 % — ABNORMAL HIGH (ref 4.8–5.6)
Mean Plasma Glucose: 159.94 mg/dL

## 2018-01-23 LAB — SEDIMENTATION RATE: Sed Rate: 121 mm/hr — ABNORMAL HIGH (ref 0–16)

## 2018-01-23 MED ORDER — SODIUM CHLORIDE 0.9 % IV SOLN
1000.0000 mL | INTRAVENOUS | Status: DC
  Administered 2018-01-23 (×2): 1000 mL via INTRAVENOUS

## 2018-01-23 NOTE — Progress Notes (Signed)
[facility] TEAM 1 - Stepdown/ICU TEAM  Jeffrey Young  WVP:[id number] DOB: 1965-09-27 DOA: 01/21/2018 PCP: System, Pcp Not In   Chief Complaint  Patient presents with  . Cough  . Wound Check      Brief Narrative:   52yo M w/ a hx of DM2 who presented to the ED w/ a 3 day h/o rigors.  In June 2019 he was on vacation, flip-flop on L foot broke, and he suffered a puncture wound to bottom of L foot.  It has been slow to heal since then, and recently got larger.    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  The pt was transferred from [facility] to [facility] for prompt Ortho evaluation, and taken to the OR emergently 01/22/18 AM.  Significant Events: 9/3 admit  9/3 transmetatarsal amputation   Subjective:  Patient in bed, appears comfortable, denies any headache, no fever, no chest pain or pressure, no shortness of breath , no abdominal pain. No focal weakness.   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 Sharol Given on 01/23/2018, margins were not entirely clean, he suggested 4 weeks of IV antibiotic treatment, follow intraoperative wound culture results, for now continue empiric antibiotics which are IV vancomycin and Zosyn, will also initiate PICC line placement.  Blood cultures have been negative.  Sepsis physiology has resolved.  ARF.  Baseline creatinine around 1.2, will hydrate and monitor, check random bladder scan, vancomycin levels being monitored by pharmacy.    DM2  -  currently on Lantus and sliding scale.  Monitor CBGs and adjust as needed.  Lab Results  Component Value Date   HGBA1C 7.2 (H) 01/23/2018   CBG (last 3)  Recent Labs    01/22/18 2333 01/23/18 0419 01/23/18 0751  GLUCAP 169* 135* 133*    DVT  prophylaxis: SCDs for now  Code Status: FULL CODE Family Communication: no family present a time of exam  Disposition Plan: SDU  Consultants:  Ortho Sharol Given   Anti-infectives (From admission, onward)   Start     Dose/Rate Route Frequency Ordered Stop   01/22/18 0830  ceFAZolin (ANCEF) IVPB 2g/100 mL premix  Status:  Discontinued     2 g 200 mL/hr over 30 Minutes Intravenous To Short Stay 01/22/18 0815 01/22/18 1120   01/22/18 0800  vancomycin (VANCOCIN) IVPB 750 mg/150 ml premix     750 mg 150 mL/hr over 60 Minutes Intravenous Every 12 hours 01/22/18 0131     01/22/18 0600  clindamycin (CLEOCIN) IVPB 900 mg  Status:  Discontinued     900 mg 100 mL/hr over 30 Minutes Intravenous Every 8 hours 01/22/18 0117 01/22/18 1556   01/22/18 0400  piperacillin-tazobactam (ZOSYN) IVPB 3.375 g     3.375 g 12.5 mL/hr over 240 Minutes Intravenous Every 8 hours 01/22/18 0131     01/21/18 2200  clindamycin (CLEOCIN) IVPB 900 mg     900 mg 100 mL/hr over 30 Minutes Intravenous  Once 01/21/18 2154 01/22/18 0031   01/21/18 2115  vancomycin (VANCOCIN) IVPB 1000 mg/200 mL premix     1,000 mg 200 mL/hr over 60 Minutes Intravenous  Once 01/21/18 2102 01/22/18 0101   01/21/18 2030  piperacillin-tazobactam (ZOSYN) IVPB 3.375 g     3.375 g 100 mL/hr over 30  Minutes Intravenous  Once 01/21/18 2026 01/21/18 2221      Objective: Blood pressure 133/67, pulse 84, temperature 100.2 F (37.9 C), temperature source Oral, resp. rate 18, height 6\' 1"  (1.854 m), weight 88 kg, SpO2 94 %.  Intake/Output Summary (Last 24 hours) at 01/23/2018 1034 Last data filed at 01/23/2018 0821 Gross per 24 hour  Intake 3524.73 ml  Output 1000 ml  Net 2524.73 ml   Filed Weights   01/21/18 2102 01/23/18 0409  Weight: 77.1 kg 88 kg    Examination:  Awake Alert, Oriented X 3, No new F.N deficits, Normal affect [place].AT,PERRAL Supple Neck,No JVD, No cervical lymphadenopathy appriciated.  Symmetrical Chest wall movement, Good air  movement bilaterally, CTAB RRR,No Gallops, Rubs or new Murmurs, No Parasternal Heave +ve B.Sounds, Abd Soft, No tenderness, No organomegaly appriciated, No rebound - guarding or rigidity. No Cyanosis, Clubbing or edema, left transmetatarsal amputation with stump under bandage and hopes to portable wound VAC  CBC: Recent Labs  Lab 01/21/18 2119 01/21/18 2135  WBC 16.6*  --   NEUTROABS 14.5*  --   HGB 9.2* 9.9*  HCT 27.9* 29.0*  MCV 88.9  --   PLT 300  --    Basic Metabolic Panel: Recent Labs  Lab 01/21/18 2119 01/21/18 2135 01/23/18 0239  NA 132* 131* 133*  K 4.1 4.0 4.4  CL 93* 95* 101  CO2 24  --  24  GLUCOSE 214* 219* 139*  BUN 20 21* 21*  CREATININE 1.31* 1.20 1.50*  CALCIUM 8.9  --  8.2*   GFR: Estimated Creatinine Clearance: 63.6 mL/min (A) (by C-G formula based on SCr of 1.5 mg/dL (H)).  Liver Function Tests: Recent Labs  Lab 01/21/18 2119 01/23/18 0239  AST 19 27  ALT 14 11  ALKPHOS 90 84  BILITOT 1.9* 1.9*  PROT 8.1 6.5  ALBUMIN 3.3* 2.4*    CBG: Recent Labs  Lab 01/22/18 1655 01/22/18 2049 01/22/18 2333 01/23/18 0419 01/23/18 0751  GLUCAP 155* 156* 169* 135* 133*    Recent Results (from the past 240 hour(s))  Blood Culture (routine x 2)     Status: None (Preliminary result)   Collection Time: 01/21/18  9:19 PM  Result Value Ref Range Status   Specimen Description   Final    BLOOD LEFT ANTECUBITAL Performed at [facility], [place] [address] [place], [place] [address]    Special Requests   Final    BOTTLES DRAWN AEROBIC AND ANAEROBIC Blood Culture results may not be optimal due to an inadequate volume of blood received in culture bottles Performed at [facility] [address], [place], [place] [address]    Culture   Final    NO GROWTH 2 DAYS Performed at [facility] [address] [place], [place] [address]    Report Status PENDING  Incomplete  Blood Culture (routine x 2)     Status:  None (Preliminary result)   Collection Time: 01/21/18  9:40 PM  Result Value Ref Range Status   Specimen Description   Final    BLOOD RIGHT ANTECUBITAL Performed at [facility] [address], [place], [place] [address]    Special Requests   Final    BOTTLES DRAWN AEROBIC AND ANAEROBIC Blood Culture adequate volume Performed at [facility] [address] [place], [place] [address]    Culture   Final    NO GROWTH 2 DAYS Performed at [facility],  [address], [place], [place] [address]    Report Status PENDING  Incomplete  MRSA PCR Screening     Status: Abnormal   Collection Time: 01/22/18  4:33 AM  Result Value Ref Range Status   MRSA by PCR POSITIVE (A) NEGATIVE Final    Comment:        The GeneXpert MRSA Assay (FDA approved for NASAL specimens only), is one component of a comprehensive MRSA colonization surveillance program. It is not intended to diagnose MRSA infection nor to guide or monitor treatment for MRSA infections. RESULT CALLED TO, READ BACK BY AND VERIFIED WITHStarlyn Skeans RN 6045 01/22/18 A BROWNING Performed at [facility], [place] [address], [place], [place] [address]   Aerobic/Anaerobic Culture (surgical/deep wound)     Status: None (Preliminary result)   Collection Time: 01/22/18 10:07 AM  Result Value Ref Range Status   Specimen Description ABSCESS LEFT FOOT MIDFOOT PLANTAR  Final   Special Requests NONE  Final   Gram Stain   Final    RARE WBC PRESENT, PREDOMINANTLY PMN MODERATE GRAM POSITIVE COCCI MODERATE GRAM NEGATIVE RODS FEW GRAM POSITIVE RODS Performed at [facility], [place] [address], [place], [place] [address]    Culture MODERATE GRAM NEGATIVE RODS  Final   Report Status PENDING  Incomplete     Scheduled Meds: . docusate sodium  100 mg Oral BID  . feeding supplement (GLUCERNA SHAKE)  237 mL Oral BID BM  . insulin aspart  0-5 Units Subcutaneous QHS  . insulin aspart  0-9 Units  Subcutaneous TID WC  . insulin glargine  10 Units Subcutaneous QHS  . multivitamin with minerals  1 tablet Oral Daily  . mupirocin ointment   Nasal BID   Continuous Infusions: . sodium chloride    . methocarbamol (ROBAXIN) IV    . piperacillin-tazobactam (ZOSYN)  IV 3.375 g (01/23/18 0639)  . vancomycin 750 mg (01/23/18 1002)     LOS: 1 day   Time spent: No Charge  Signature  Lala Lund M.D on 01/23/2018 at 10:34 AM  To page go to [email or web address] - password [email or web address]

## 2018-01-23 NOTE — Telephone Encounter (Signed)
I spoke with vascular lab and advised that per Dr. Sharol Given no ABIs needed. Pt had a pulse.

## 2018-01-23 NOTE — Evaluation (Signed)
Physical Therapy Evaluation Patient Details Name: Jeffrey Young MRN: [id number] DOB: June 13, 1965 Today's Date: 01/23/2018   History of Present Illness  52yo M w/ a hx ofDM2 who presented to the ED w/ a 3 day h/o rigors. In June 2019 he was on vacation, flip-flop on L foot broke, and he suffered a puncture wound to bottom of L foot. It has been slow to heal since then, and recently got larger. 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  The pt was transferred from [facility] to [facility] for prompt Ortho evaluation, and taken to the OR emergently 01/22/18 AM for transmetatarsal amputation.   Clinical Impression  Pt admitted with above diagnosis. Pt currently with functional limitations due to the deficits listed below (see PT Problem List). Pt was able to pivot with min to min guard assist with RW and maintained NWB left LE today.  May need wheelchair for home as unsure if he can maintain NWB all over home.  Will follow acutely. Pt will benefit from skilled PT to increase their independence and safety with mobility to allow discharge to the venue listed below.      Follow Up Recommendations Home health PT;Supervision/Assistance - 24 hour    Equipment Recommendations  Rolling walker with 5" wheels;3in1 (PT);Wheelchair (measurements PT);Wheelchair cushion (measurements PT)    Recommendations for Other Services       Precautions / Restrictions Precautions Precautions: Fall Precaution Comments: left VAC in place Restrictions Weight Bearing Restrictions: Yes LLE Weight Bearing: Non weight bearing      Mobility  Bed Mobility Overal bed mobility: Independent                Transfers Overall transfer level: Needs assistance Equipment used: Rolling walker (2 wheeled) Transfers: Sit to/from [facility] Sit to Stand: Min guard;Min assist Stand pivot transfers: Min guard;Min assist       General transfer comment: Pt was  able to stand with assist to power up.  Pt needed cues for hand placement.  Once up, took pivotal steps to recliner with min guard assist and cues.  Pt was able to hold left LE off floor and maintain NWB left LE the short distance to chair.   Ambulation/Gait                Stairs            Wheelchair Mobility    Modified Rankin (Stroke Patients Only)       Balance Overall balance assessment: Needs assistance Sitting-balance support: No upper extremity supported;Feet supported Sitting balance-Leahy Scale: Fair     Standing balance support: Bilateral upper extremity supported;During functional activity Standing balance-Leahy Scale: Poor Standing balance comment: Pt was able to stand with RW with definite need for UE support especially due to NWB left LE.                             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    Prior Function Level of Independence: Independent  Hand Dominance   Dominant Hand: Left    Extremity/Trunk Assessment   Upper Extremity Assessment Upper Extremity Assessment: Defer to OT evaluation    Lower Extremity Assessment Lower Extremity Assessment: RLE deficits/detail;LLE deficits/detail RLE Deficits / Details: grossly 3/5 LLE Deficits / Details: hip and knee 3/5 LLE: Unable to fully assess due to pain    Cervical / Trunk Assessment Cervical / Trunk Assessment: Normal  Communication   Communication: No difficulties  Cognition  Arousal/Alertness: Awake/alert Behavior During Therapy: Flat affect Overall Cognitive Status: Within Functional Limits for tasks assessed                                        General Comments      Exercises     Assessment/Plan    PT Assessment Patient needs continued PT services  PT Problem List Decreased balance;Decreased activity tolerance;Decreased strength;Decreased mobility;Decreased knowledge of use of DME;Decreased safety awareness;Decreased knowledge of precautions;Decreased skin integrity       PT Treatment Interventions DME instruction;Gait training;Functional mobility training;Therapeutic activities;Therapeutic exercise;Balance training;Patient/family education;Stair training    PT Goals (Current goals can be found in the Care Plan section)  Acute Rehab PT Goals Patient Stated Goal: to get back to work PT Goal Formulation: With patient Time For Goal Achievement: 02/06/18 Potential to Achieve Goals: Good    Frequency Min 3X/week   Barriers to discharge Decreased caregiver support wife works    Co-evaluation               AM-PAC PT "6 Clicks" Daily Activity  Outcome Measure Difficulty turning over in bed (including adjusting bedclothes, sheets and blankets)?: None Difficulty moving from lying on back to sitting on the side of the bed? : None Difficulty sitting down on and standing up from a chair with arms (e.g., wheelchair, bedside commode, etc,.)?: A Lot Help needed moving to and from a bed to chair (including a wheelchair)?: A Lot Help needed walking in hospital room?: A Lot Help needed climbing 3-5 steps with a railing? : Total 6 Click Score: 15    End of Session Equipment Utilized During Treatment: Gait belt Activity Tolerance: Patient limited by fatigue Patient left: in chair;with call bell/phone within reach;with chair alarm set Nurse Communication: Mobility status;Weight bearing status PT Visit Diagnosis: Unsteadiness on feet  (R26.81);Muscle weakness (generalized) (M62.81)    Time: 7902-4097 PT Time Calculation (min) (ACUTE ONLY): 24 min   Charges:   PT Evaluation $PT Eval Moderate Complexity: 1 Mod PT Treatments $Therapeutic Activity: 8-22 mins        Timoty Bourke,PT Acute Rehabilitation Services Pager:  [phone]  Office:  [phone]    Denice Paradise 01/23/2018, 1:27 PM

## 2018-01-23 NOTE — Progress Notes (Signed)
Subjective: Reports no pain in left foot currently.  VAC dressing in place and scant serosanguinous drainage in canister.     Objective: Vital signs in last 24 hours: Temp:  [98.3 F (36.8 C)-101 F (38.3 C)] 100.2 F (37.9 C) (09/04 0748) Pulse Rate:  [82-93] 84 (09/04 0748) Resp:  [12-19] 18 (09/04 0748) BP: (92-133)/(54-83) 133/67 (09/04 0748) SpO2:  [94 %-99 %] 94 % (09/04 0409) Weight:  [88 kg] 88 kg (09/04 0409)  Intake/Output from previous day: 09/03 0701 - 09/04 0700 In: 3874.7 [P.O.:870; I.V.:2321.9; IV Piggyback:682.9] Out: 800 [Urine:700; Blood:100] Intake/Output this shift: No intake/output data recorded.  Recent Labs    01/21/18 2119 01/21/18 2135  HGB 9.2* 9.9*   Recent Labs    01/21/18 2119 01/21/18 2135  WBC 16.6*  --   RBC 3.14*  --   HCT 27.9* 29.0*  PLT 300  --    Recent Labs    01/21/18 2119 01/21/18 2135 01/23/18 0239  NA 132* 131* 133*  K 4.1 4.0 4.4  CL 93* 95* 101  CO2 24  --  24  BUN 20 21* 21*  CREATININE 1.31* 1.20 1.50*  GLUCOSE 214* 219* 139*  CALCIUM 8.9  --  8.2*   No results for input(s): LABPT, INR in the last 72 hours.  Left transmetatarsal amputation with Prevena VAC in place. Good dorsalis pedis pulse. Scant serosanguinous drainage in canister.     Assessment/Plan: POD #1 Left transmetatarsal amputation- Continue VANC/Zosyn x 3 days post op, then 4 weeks of antibiotics based on cultures from OR. Non weight bearing and elevate left foot as much as possible.  Continue VAC until follows up in office next week.    [place]

## 2018-01-23 NOTE — Telephone Encounter (Signed)
Artis Delay from [facility] called needing to know if the order for ABI is still needed. The number to contact Artis Delay is [phone]

## 2018-01-23 NOTE — Telephone Encounter (Signed)
Message text to Dr. Sharol Given to ask if still needed.

## 2018-01-23 NOTE — Progress Notes (Signed)
Pt noted to desat low 80s-high 70's while asleep X 2 occasions.  2 L [place] applied for sleep. Will continue to monitor.

## 2018-01-23 NOTE — Plan of Care (Signed)
  Problem: Clinical Measurements: Goal: Respiratory complications will improve Outcome: Progressing Note:  No s/s of respiratory complications.  Goal: Cardiovascular complication will be avoided Outcome: Progressing Note:  No s/s of cardiovascular complication.   Problem: Coping: Goal: Level of anxiety will decrease Outcome: Progressing Note:  No s/s of anxiety noted.

## 2018-01-23 NOTE — Progress Notes (Signed)
PICC order received.  Spoke with patient and obtain consent explaining risks and benefits.  Agreeable to PICC placement.  Plan on PICC insertion 01/24/18 in am.  Floor RN notified.

## 2018-01-24 LAB — BASIC METABOLIC PANEL
Anion gap: 8 (ref 5–15)
BUN: 11 mg/dL (ref 6–20)
CO2: 24 mmol/L (ref 22–32)
Calcium: 8.1 mg/dL — ABNORMAL LOW (ref 8.9–10.3)
Chloride: 102 mmol/L (ref 98–111)
Creatinine, Ser: 1.09 mg/dL (ref 0.61–1.24)
GFR calc Af Amer: >60 mL/min (ref >60)
GFR calc non Af Amer: >60 mL/min (ref >60)
Glucose, Bld: 100 mg/dL — ABNORMAL HIGH (ref 70–99)
Potassium: 3.6 mmol/L (ref 3.5–5.1)
Sodium: 134 mmol/L — ABNORMAL LOW (ref 135–145)

## 2018-01-24 LAB — CBC
HCT: 20.4 % — ABNORMAL LOW (ref 39.0–52.0)
HCT: 27.6 % — ABNORMAL LOW (ref 39.0–52.0)
Hemoglobin: 6.5 g/dL — CL (ref 13.0–17.0)
Hemoglobin: 9 g/dL — ABNORMAL LOW (ref 13.0–17.0)
MCH: 29.3 pg (ref 26.0–34.0)
MCH: 29.6 pg (ref 26.0–34.0)
MCHC: 31.9 g/dL (ref 30.0–36.0)
MCHC: 32.6 g/dL (ref 30.0–36.0)
MCV: 91.9 fL (ref 78.0–100.0)
MCV: 90.8 fL (ref 78.0–100.0)
Platelets: 255 10*3/uL (ref 150–400)
Platelets: 306 10*3/uL (ref 150–400)
RBC: 2.22 MIL/uL — ABNORMAL LOW (ref 4.22–5.81)
RBC: 3.04 MIL/uL — ABNORMAL LOW (ref 4.22–5.81)
RDW: 12.6 % (ref 11.5–15.5)
RDW: 13.2 % (ref 11.5–15.5)
WBC: 12.1 10*3/uL — ABNORMAL HIGH (ref 4.0–10.5)
WBC: 12.2 10*3/uL — ABNORMAL HIGH (ref 4.0–10.5)

## 2018-01-24 LAB — GLUCOSE, CAPILLARY
Glucose-Capillary: 103 mg/dL — ABNORMAL HIGH (ref 70–99)
Glucose-Capillary: 107 mg/dL — ABNORMAL HIGH (ref 70–99)
Glucose-Capillary: 111 mg/dL — ABNORMAL HIGH (ref 70–99)
Glucose-Capillary: 126 mg/dL — ABNORMAL HIGH (ref 70–99)
Glucose-Capillary: 77 mg/dL (ref 70–99)
Glucose-Capillary: 84 mg/dL (ref 70–99)

## 2018-01-24 LAB — ABO/RH: ABO/RH(D): A POS

## 2018-01-24 LAB — PREPARE RBC (CROSSMATCH)

## 2018-01-24 MED ORDER — FUROSEMIDE 10 MG/ML IJ SOLN
40.0000 mg | Freq: Once | INTRAMUSCULAR | Status: AC
  Administered 2018-01-24: 40 mg via INTRAVENOUS
  Filled 2018-01-24: qty 4

## 2018-01-24 MED ORDER — FUROSEMIDE 10 MG/ML IJ SOLN
40.0000 mg | Freq: Once | INTRAMUSCULAR | Status: DC
  Filled 2018-01-24: qty 4

## 2018-01-24 MED ORDER — SODIUM CHLORIDE 0.9% IV SOLUTION: Freq: Once | INTRAVENOUS | Status: DC

## 2018-01-24 MED ORDER — CEFAZOLIN SODIUM-DEXTROSE 2-4 GM/100ML-% IV SOLN
2.0000 g | Freq: Three times a day (TID) | INTRAVENOUS | Status: DC
  Administered 2018-01-24 – 2018-01-25 (×3): 2 g via INTRAVENOUS
  Filled 2018-01-24 (×6): qty 100

## 2018-01-24 MED ORDER — SODIUM CHLORIDE 0.9% FLUSH: 10.0000 mL | INTRAVENOUS | Status: DC | PRN

## 2018-01-24 MED ORDER — SODIUM CHLORIDE 0.9% FLUSH
10.0000 mL | Freq: Two times a day (BID) | INTRAVENOUS | Status: DC
  Administered 2018-01-24 – 2018-01-25 (×2): 10 mL

## 2018-01-24 NOTE — Progress Notes (Signed)
Physical Therapy Treatment Patient Details Name: Jeffrey Young MRN: [id number] DOB: [birth year] Today's Date: 01/24/2018    History of Present Illness 52yo M w/ a hx ofDM2 who presented to the ED w/ a 3 day h/o rigors. In June 2019 he was on vacation, flip-flop on L foot broke, and he suffered a puncture wound to bottom of L foot. It has been slow to heal since then, and recently got larger. 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  The pt was transferred from [facility] to [facility] for prompt Ortho evaluation, and taken to the OR emergently 01/22/18 AM for transmetatarsal amputation.     PT Comments    Pt admitted with above diagnosis. Pt currently with functional limitations due to the deficits listed below (see PT Problem List). Pt was able to ambulate with RW to door and back to bed.  Did well with maintaining weight bearing.   Progressing well.  Pt will benefit from skilled PT to increase their independence and safety with mobility to allow discharge to the venue listed below.    Follow Up Recommendations  Home health PT;Supervision/Assistance - 24 hour     Equipment Recommendations  Rolling walker with 5" wheels;3in1 (PT)(Pt is 6 feet 1 inch so may need tall equipment)    Recommendations for Other Services       Precautions / Restrictions Precautions Precautions: Fall Precaution Comments: left VAC in place Restrictions Weight Bearing Restrictions: Yes LLE Weight Bearing: Non weight bearing    Mobility  Bed Mobility Overal bed mobility: Independent                Transfers Overall transfer level: Needs assistance Equipment used: Rolling walker (2 wheeled) Transfers: Sit to/from [employer] Transfers Sit to Stand: Min guard;From elevated surface         General transfer comment: Pt was able to stand without assist to power up but bed was elevated.  Pt needed cues for hand placement.  Pt was able to hold left  LE off floor and maintain NWB left LE.  Ambulation/Gait Ambulation/Gait assistance: Min guard Gait Distance (Feet): 40 Feet Assistive device: Rolling walker (2 wheeled) Gait Pattern/deviations: Step-to pattern;Decreased stride length;Trunk flexed   Gait velocity interpretation: <1.31 ft/sec, indicative of household ambulator General Gait Details: Pt was able to walk with RW with overall good safety and stability holding left LE off floor.  Pt did fatigue and need several standing rest breaks prior to getting back to bed.  Wanted to sleep more and then get up later as he did not sleep last night.    Stairs             Wheelchair Mobility    Modified Rankin (Stroke Patients Only)       Balance Overall balance assessment: Needs assistance Sitting-balance support: No upper extremity supported;Feet supported Sitting balance-Leahy Scale: Fair     Standing balance support: Bilateral upper extremity supported;During functional activity Standing balance-Leahy Scale: Poor Standing balance comment: Pt was able to stand with RW with definite need for UE support especially due to NWB left LE.                            Cognition Arousal/Alertness: Awake/alert Behavior During Therapy: Flat affect Overall Cognitive Status: Within Functional Limits for tasks assessed  Exercises      General Comments        Pertinent Vitals/Pain Pain Assessment: No/denies pain    Home Living                      Prior Function            PT Goals (current goals can now be found in the care plan section) Acute Rehab PT Goals Patient Stated Goal: to get back to work Progress towards PT goals: Progressing toward goals    Frequency    Min 3X/week      PT Plan Current plan remains appropriate    Co-evaluation              AM-PAC PT "6 Clicks" Daily Activity  Outcome Measure  Difficulty turning  over in bed (including adjusting bedclothes, sheets and blankets)?: None Difficulty moving from lying on back to sitting on the side of the bed? : None Difficulty sitting down on and standing up from a chair with arms (e.g., wheelchair, bedside commode, etc,.)?: A Little Help needed moving to and from a bed to chair (including a wheelchair)?: A Little Help needed walking in hospital room?: A Little Help needed climbing 3-5 steps with a railing? : A Little 6 Click Score: 20    End of Session Equipment Utilized During Treatment: Gait belt Activity Tolerance: Patient limited by fatigue Patient left: in bed;with bed alarm set;with call bell/phone within reach Nurse Communication: Mobility status;Weight bearing status PT Visit Diagnosis: Unsteadiness on feet (R26.81);Muscle weakness (generalized) (M62.81)     Time: 1010-1033 PT Time Calculation (min) (ACUTE ONLY): 23 min  Charges:  $Gait Training: 23-37 mins                     [place] Pager:  [phone]  Office:  [place] 01/24/2018, 12:15 PM

## 2018-01-24 NOTE — Progress Notes (Signed)
Peripherally Inserted Central Catheter/Midline Placement  The IV Nurse has discussed with the patient and/or persons authorized to consent for the patient, the purpose of this procedure and the potential benefits and risks involved with this procedure.  The benefits include less needle sticks, lab draws from the catheter, and the patient may be discharged home with the catheter. Risks include, but not limited to, infection, bleeding, blood clot (thrombus formation), and puncture of an artery; nerve damage and irregular heartbeat and possibility to perform a PICC exchange if needed/ordered by physician.  Alternatives to this procedure were also discussed.  Bard Power PICC patient education guide, fact sheet on infection prevention and patient information card has been provided to patient /or left at bedside.    PICC/Midline Placement Documentation  PICC Single Lumen 60/73/71 PICC Right Basilic 44 cm 0 cm (Active)  Indication for Insertion or Continuance of Line Home intravenous therapies (PICC only) 01/24/2018  6:05 PM  Exposed Catheter (cm) 0 cm 01/24/2018  6:05 PM  Site Assessment Clean;Dry;Intact 01/24/2018  6:05 PM  Line Status Flushed;Saline locked;Blood return noted 01/24/2018  6:05 PM  Dressing Type Transparent 01/24/2018  6:05 PM  Dressing Status Clean;Dry;Intact 01/24/2018  6:05 PM  Dressing Intervention New dressing 01/24/2018  6:05 PM  Dressing Change Due 01/31/18 01/24/2018  6:05 PM       [place] Cosma, Nicolette Bang 01/24/2018, 6:05 PM

## 2018-01-24 NOTE — Progress Notes (Signed)
[place] will provide [facility], DME and Home Infusion Pharmacy services for home IV ABX at DC. [facility] Infusion Coordinator will provide in hospital teaching with pt and wife regarding IV ABX administration at home to support independence.  If patient discharges after hours, please call [phone].   Larry Sierras 01/24/2018, 1:23 PM

## 2018-01-24 NOTE — Progress Notes (Signed)
Currently treating osteomyelitis s/p transmetatarsal amputation on 9/3. Cultures have reported back positive for Proteus mirabilis that is sensitive to Cefazolin. Ok to deescalate vancomycin and zosyn regimen to cefazolin 2g q8h for a total of 4 weeks post-op per Dr. Candiss Norse.    Gwenlyn Found, Sherian Rein D PGY1 Pharmacy Resident  Phone [phone] 01/24/2018   3:11 PM

## 2018-01-24 NOTE — Progress Notes (Addendum)
[place] TEAM 1 - Stepdown/ICU TEAM  Adeyemi Hamad  VHQ:[id number] DOB: August 21, 1965 DOA: 01/21/2018 PCP: System, Pcp Not In   Chief Complaint  Patient presents with  . Cough  . Wound Check      Brief Narrative:   52yo M w/ a hx of DM2 who presented to the ED w/ a 3 day h/o rigors.  In June 2019 he was on vacation, flip-flop on L foot broke, and he suffered a puncture wound to bottom of L foot.  It has been slow to heal since then, and recently got larger.    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  The pt was transferred from [facility] to [facility] for prompt Ortho evaluation, and taken to the OR emergently 01/22/18 AM.  Significant Events: 9/3 admit  9/3 transmetatarsal amputation   Subjective:  Patient in bed, appears comfortable, denies any headache, no fever, no chest pain or pressure, no shortness of breath , no abdominal pain. No focal weakness.  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 Sharol Given on 01/23/2018, margins were not entirely clean & he suggested 4 weeks of IV antibiotic treatment.  He is currently on empiric vancomycin and Zosyn IV, PICC line to be placed today, still monitoring intraoperative wound cultures which are still to be finalized, so far Proteus noted, PT, advance activity likely discharge in the next 1 to 2 days.  Sepsis physiology has completely resolved.   ARF.  Baseline creatinine around 1.2, will hydrate and monitor, check random bladder scan, vancomycin levels being monitored by pharmacy.   Acute perioperative blood loss related anemia.  Stable after 2 units of packed RBC discharge.  PCP to monitor.    DM2  -  currently on Lantus and sliding scale.  Monitor CBGs and adjust as  needed.  He takes Lantus at home.  Lab Results  Component Value Date   HGBA1C 7.2 (H) 01/23/2018   CBG (last 3)  Recent Labs    01/24/18 0047 01/24/18 0504 01/24/18 0807  GLUCAP 107* 84 77     DVT prophylaxis: Heparin Code Status: FULL CODE Family Communication: no family present a time of exam  Disposition Plan: SDU  Consultants:  Ortho Sharol Given   Anti-infectives (From admission, onward)   Start     Dose/Rate Route Frequency Ordered Stop   01/22/18 0830  ceFAZolin (ANCEF) IVPB 2g/100 mL premix  Status:  Discontinued     2 g 200 mL/hr over 30 Minutes Intravenous To Short Stay 01/22/18 0815 01/22/18 1120   01/22/18 0800  vancomycin (VANCOCIN) IVPB 750 mg/150 ml premix     750 mg 150 mL/hr over 60 Minutes Intravenous Every 12 hours 01/22/18 0131     01/22/18 0600  clindamycin (CLEOCIN) IVPB 900 mg  Status:  Discontinued     900 mg 100 mL/hr over 30 Minutes Intravenous Every 8 hours 01/22/18 0117 01/22/18 1556   01/22/18 0400  piperacillin-tazobactam (ZOSYN) IVPB 3.375 g     3.375 g 12.5 mL/hr over 240 Minutes Intravenous Every 8 hours 01/22/18 0131     01/21/18 2200  clindamycin (CLEOCIN) IVPB 900 mg     900 mg 100 mL/hr over 30 Minutes Intravenous  Once 01/21/18 2154 01/22/18 0031   01/21/18 2115  vancomycin (  VANCOCIN) IVPB 1000 mg/200 mL premix     1,000 mg 200 mL/hr over 60 Minutes Intravenous  Once 01/21/18 2102 01/22/18 0101   01/21/18 2030  piperacillin-tazobactam (ZOSYN) IVPB 3.375 g     3.375 g 100 mL/hr over 30 Minutes Intravenous  Once 01/21/18 2026 01/21/18 2221      Objective: Blood pressure (!) 150/80, pulse 99, temperature 99.3 F (37.4 C), temperature source Oral, resp. rate 20, height 6\' 1"  (1.854 m), weight 88 kg, SpO2 99 %.  Intake/Output Summary (Last 24 hours) at 01/24/2018 1020 Last data filed at 01/24/2018 0836 Gross per 24 hour  Intake 4534.77 ml  Output 2150 ml  Net 2384.77 ml   Filed Weights   01/21/18 2102 01/23/18 0409  Weight: 77.1 kg  88 kg    Examination:  Awake Alert, Oriented X 3, No new F.N deficits, Normal affect [address].AT,PERRAL Supple Neck,No JVD, No cervical lymphadenopathy appriciated.  Symmetrical Chest wall movement, Good air movement bilaterally, CTAB RRR,No Gallops, Rubs or new Murmurs, No Parasternal Heave +ve B.Sounds, Abd Soft, No tenderness, No organomegaly appriciated, No rebound - guarding or rigidity. No Cyanosis,  left transmetatarsal amputation with stump under bandage and hopes to portable wound VAC  CBC: Recent Labs  Lab 01/21/18 2119 01/21/18 2135 01/24/18 0204  WBC 16.6*  --  12.1*  NEUTROABS 14.5*  --   --   HGB 9.2* 9.9* 6.5*  HCT 27.9* 29.0* 20.4*  MCV 88.9  --  91.9  PLT 300  --  245   Basic Metabolic Panel: Recent Labs  Lab 01/21/18 2119 01/21/18 2135 01/23/18 0239 01/24/18 0204  NA 132* 131* 133* 134*  K 4.1 4.0 4.4 3.6  CL 93* 95* 101 102  CO2 24  --  24 24  GLUCOSE 214* 219* 139* 100*  BUN 20 21* 21* 11  CREATININE 1.31* 1.20 1.50* 1.09  CALCIUM 8.9  --  8.2* 8.1*   GFR: Estimated Creatinine Clearance: 87.6 mL/min (by C-G formula based on SCr of 1.09 mg/dL).  Liver Function Tests: Recent Labs  Lab 01/21/18 2119 01/23/18 0239  AST 19 27  ALT 14 11  ALKPHOS 90 84  BILITOT 1.9* 1.9*  PROT 8.1 6.5  ALBUMIN 3.3* 2.4*    CBG: Recent Labs  Lab 01/23/18 1657 01/23/18 2041 01/24/18 0047 01/24/18 0504 01/24/18 0807  GLUCAP 94 115* 107* 84 77    Recent Results (from the past 240 hour(s))  Blood Culture (routine x 2)     Status: None (Preliminary result)   Collection Time: 01/21/18  9:19 PM  Result Value Ref Range Status   Specimen Description   Final    BLOOD LEFT ANTECUBITAL Performed at [facility], [place] [address], [place], [place] [address]    Special Requests   Final    BOTTLES DRAWN AEROBIC AND ANAEROBIC Blood Culture results may not be optimal due to an inadequate volume of blood received in culture bottles Performed at  [place] [address], [place], [place] [address]    Culture   Final    NO GROWTH 3 DAYS Performed at [facility], [place] [address] [place],  [address]    Report Status PENDING  Incomplete  Blood Culture (routine x 2)     Status: None (Preliminary result)   Collection Time: 01/21/18  9:40 PM  Result Value Ref Range Status   Specimen Description   Final    BLOOD RIGHT ANTECUBITAL Performed at [facility], [place] [address], [place], [place] [address]    Special Requests   Final    BOTTLES DRAWN AEROBIC AND ANAEROBIC Blood Culture adequate volume Performed at [place] [address], [place], [place] [address]    Culture   Final    NO GROWTH 3 DAYS Performed at [facility], [place] [address], [place], [place] [address]    Report Status PENDING  Incomplete  MRSA PCR Screening     Status: Abnormal   Collection Time: 01/22/18  4:33 AM  Result Value Ref Range Status   MRSA by PCR POSITIVE (A) NEGATIVE Final    Comment:        The GeneXpert MRSA Assay (FDA approved for NASAL specimens only), is one component of a comprehensive MRSA colonization surveillance program. It is not intended to diagnose MRSA infection nor to guide or monitor treatment for MRSA infections. RESULT CALLED TO, READ BACK BY AND VERIFIED WITHStarlyn Skeans RN 2060 01/22/18 A BROWNING Performed at [facility], [place] [address], [place], [place] [address]   Aerobic/Anaerobic Culture (surgical/deep wound)     Status: None (Preliminary result)   Collection Time: 01/22/18 10:07 AM  Result Value Ref Range Status   Specimen Description ABSCESS LEFT FOOT MIDFOOT PLANTAR  Final   Special Requests NONE  Final   Gram Stain   Final    RARE WBC PRESENT, PREDOMINANTLY PMN MODERATE GRAM POSITIVE COCCI MODERATE GRAM NEGATIVE RODS FEW GRAM POSITIVE RODS Performed at [facility], [place] [address] [place], [place] [address]     Culture MODERATE PROTEUS MIRABILIS  Final   Report Status PENDING  Incomplete     Scheduled Meds: . sodium chloride   Intravenous Once  . docusate sodium  100 mg Oral BID  . feeding supplement (GLUCERNA SHAKE)  237 mL Oral BID BM  . furosemide  40 mg Intravenous Once  . insulin aspart  0-5 Units Subcutaneous QHS  . insulin aspart  0-9 Units Subcutaneous TID WC  . insulin glargine  10 Units Subcutaneous QHS  . multivitamin with minerals  1 tablet Oral Daily  . mupirocin ointment   Nasal BID   Continuous Infusions: . piperacillin-tazobactam (ZOSYN)  IV 3.375 g (01/24/18 2761)  . vancomycin 750 mg (01/23/18 2157)     LOS: 2 days   Time spent: No Charge  Signature  Lala Lund M.D on 01/24/2018 at 10:20 AM  To page go to [email or web address] - password [facility]

## 2018-01-24 NOTE — Care Management Note (Signed)
Case Management Note  Patient Details  Name: Jeffrey Young MRN: [id number] Date of Birth: 1965/06/11  Subjective/Objective:  Pt presented for foot wound hx diabetic neuropathy. Pt with Left forefoot Cellulitis abscess. Post transmetatarsal amputation 01-22-18. Plan for home on IV antibiotics for 4 weeks per MD. PTA from home with support of wife. PCP is at [facility]. [facility] Agency list provided to patient and patient chose [facility] for Services and DME.               Action/Plan: CM did make referral with Liaison Pam with [facility] for [facility] RN/ PT Services/ DME. Pt will need HH RN/PT orders and DME orders: Tall RW and 3n1. DME will be delivered to room prior to transition home. Pt will need Rx for Antibiotics as well. Per patient wife can transport home via private vehicle. No further needs from  CM at this time.   Expected Discharge Date:  01/25/18               Expected Discharge Plan:  [place]  In-House Referral:  NA  Discharge planning Services  CM Consult  Post Acute Care Choice:  Home Health, Durable Medical Equipment Choice offered to:  Patient  DME Arranged:  3-N-1, Walker tall, Walker rolling, IV pump/equipment DME Agency:  [place]:  RN, Disease Management, PT, IV Antibiotics HH Agency:  [place]  Status of Service:  Completed, signed off  If discussed at [place] of Stay Meetings, dates discussed:    Additional Comments:  Bethena Roys, RN 01/24/2018, 11:38 AM

## 2018-01-25 LAB — BASIC METABOLIC PANEL
Anion gap: 8 (ref 5–15)
BUN: 10 mg/dL (ref 6–20)
CO2: 28 mmol/L (ref 22–32)
Calcium: 8.6 mg/dL — ABNORMAL LOW (ref 8.9–10.3)
Chloride: 97 mmol/L — ABNORMAL LOW (ref 98–111)
Creatinine, Ser: 1.08 mg/dL (ref 0.61–1.24)
GFR calc Af Amer: >60 mL/min (ref >60)
GFR calc non Af Amer: >60 mL/min (ref >60)
Glucose, Bld: 164 mg/dL — ABNORMAL HIGH (ref 70–99)
Potassium: 3.5 mmol/L (ref 3.5–5.1)
Sodium: 133 mmol/L — ABNORMAL LOW (ref 135–145)

## 2018-01-25 LAB — BLOOD CULTURE ID PANEL (REFLEXED)
Acinetobacter baumannii: NOT DETECTED
Candida albicans: NOT DETECTED
Candida glabrata: NOT DETECTED
Candida krusei: NOT DETECTED
Candida parapsilosis: NOT DETECTED
Candida tropicalis: NOT DETECTED
Carbapenem resistance: NOT DETECTED
Enterobacter cloacae complex: NOT DETECTED
Enterobacteriaceae species: DETECTED — AB
Enterococcus species: NOT DETECTED
Escherichia coli: NOT DETECTED
Haemophilus influenzae: NOT DETECTED
Klebsiella oxytoca: NOT DETECTED
Klebsiella pneumoniae: NOT DETECTED
Listeria monocytogenes: NOT DETECTED
Methicillin resistance: NOT DETECTED
Neisseria meningitidis: NOT DETECTED
Proteus species: DETECTED — AB
Pseudomonas aeruginosa: NOT DETECTED
Serratia marcescens: NOT DETECTED
Staphylococcus aureus (BCID): NOT DETECTED
Staphylococcus species: NOT DETECTED
Streptococcus agalactiae: NOT DETECTED
Streptococcus pneumoniae: NOT DETECTED
Streptococcus pyogenes: NOT DETECTED
Streptococcus species: NOT DETECTED
Vancomycin resistance: NOT DETECTED

## 2018-01-25 LAB — TYPE AND SCREEN
ABO/RH(D): A POS
Antibody Screen: NEGATIVE
Unit division: 0
Unit division: 0

## 2018-01-25 LAB — BPAM RBC
Blood Product Expiration Date: 201909192359
Blood Product Expiration Date: 201909292359
ISSUE DATE / TIME: 201909050813
ISSUE DATE / TIME: 201909051324
Unit Type and Rh: 5100
Unit Type and Rh: 6200

## 2018-01-25 LAB — AEROBIC/ANAEROBIC CULTURE W GRAM STAIN (SURGICAL/DEEP WOUND)

## 2018-01-25 LAB — GLUCOSE, CAPILLARY
Glucose-Capillary: 148 mg/dL — ABNORMAL HIGH (ref 70–99)
Glucose-Capillary: 170 mg/dL — ABNORMAL HIGH (ref 70–99)
Glucose-Capillary: 146 mg/dL — ABNORMAL HIGH (ref 70–99)

## 2018-01-25 MED ORDER — ACETAMINOPHEN 500 MG PO TABS: 1000.0000 mg | ORAL_TABLET | Freq: Three times a day (TID) | ORAL | 0 refills | Status: AC | PRN

## 2018-01-25 MED ORDER — CEFAZOLIN IV (FOR PTA / DISCHARGE USE ONLY): 2.0000 g | Freq: Three times a day (TID) | INTRAVENOUS | 0 refills | Status: AC

## 2018-01-25 NOTE — Progress Notes (Signed)
Physical Therapy Treatment Patient Details Name: Jeffrey Young MRN: [id number] DOB: 1965/07/04 Today's Date: 01/25/2018    History of Present Illness 52yo M w/ a hx ofDM2 who presented to the ED w/ a 3 day h/o rigors. In June 2019 he was on vacation, flip-flop on L foot broke, and he suffered a puncture wound to bottom of L foot. It has been slow to heal since then, and recently got larger. 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  The pt was transferred from [facility] to [facility] for prompt Ortho evaluation, and taken to the OR emergently 01/22/18 AM for transmetatarsal amputation.     PT Comments    Pt making steady progress. Pt to return home with family and HHPT.   Follow Up Recommendations  Home health PT;Supervision/Assistance - 24 hour     Equipment Recommendations  Rolling walker with 5" wheels;3in1 (PT)(Pt is 6 feet 1 inch so may need tall equipment)    Recommendations for Other Services       Precautions / Restrictions Precautions Precautions: Fall Precaution Comments: left VAC in place Restrictions Weight Bearing Restrictions: Yes LLE Weight Bearing: Non weight bearing    Mobility  Bed Mobility Overal bed mobility: Independent                Transfers Overall transfer level: Needs assistance Equipment used: Rolling walker (2 wheeled) Transfers: Sit to/from [facility] Sit to Stand: Min guard         General transfer comment: Verbal cues for hand placement  Ambulation/Gait Ambulation/Gait assistance: Supervision Gait Distance (Feet): 50 Feet Assistive device: Rolling walker (2 wheeled) Gait Pattern/deviations: Step-to pattern;Decreased stride length(hop to) Gait velocity: decr Gait velocity interpretation: <1.31 ft/sec, indicative of household ambulator General Gait Details: Pt able to maintain NWB throughout ambulation. Slight unsteadiness but able to self  correct   Stairs Stairs: Yes Stairs assistance: Min assist Stair Management: Backwards;Step to pattern;With walker Number of Stairs: 1 General stair comments: Instructed in sequence of up/down multiple stairs backwards with walker.   Wheelchair Mobility    Modified Rankin (Stroke Patients Only)       Balance Overall balance assessment: Needs assistance Sitting-balance support: No upper extremity supported;Feet supported Sitting balance-Leahy Scale: Good     Standing balance support: Bilateral upper extremity supported;During functional activity Standing balance-Leahy Scale: Poor Standing balance comment: UE assist due to NWB of LLE                            Cognition Arousal/Alertness: Awake/alert Behavior During Therapy: Flat affect Overall Cognitive Status: Within Functional Limits for tasks assessed                                        Exercises      General Comments        Pertinent Vitals/Pain Pain Assessment: No/denies pain    Home Living                      Prior Function            PT Goals (current goals can now be found in the care plan section) Progress towards PT goals: Progressing toward goals    Frequency    Min 3X/week      PT Plan Current plan  remains appropriate    Co-evaluation              AM-PAC PT "6 Clicks" Daily Activity  Outcome Measure  Difficulty turning over in bed (including adjusting bedclothes, sheets and blankets)?: None Difficulty moving from lying on back to sitting on the side of the bed? : None Difficulty sitting down on and standing up from a chair with arms (e.g., wheelchair, bedside commode, etc,.)?: A Little Help needed moving to and from a bed to chair (including a wheelchair)?: A Little Help needed walking in hospital room?: A Little Help needed climbing 3-5 steps with a railing? : A Little 6 Click Score: 20    End of Session Equipment Utilized During  Treatment: Gait belt Activity Tolerance: Patient tolerated treatment well Patient left: with call bell/phone within reach;in chair;with chair alarm set Nurse Communication: Mobility status;Weight bearing status PT Visit Diagnosis: Unsteadiness on feet (R26.81);Muscle weakness (generalized) (M62.81)     Time: 6256-3893 PT Time Calculation (min) (ACUTE ONLY): 21 min  Charges:  $Gait Training: 8-22 mins                     [place] Pager [phone] Office [place] 01/25/2018, 1:41 PM

## 2018-01-25 NOTE — Discharge Instructions (Signed)
Follow with Primary MD in 7 days   Get CBC, CMP, checked  by Primary MD in 5-7 days   Activity: As tolerated with Full fall precautions use walker/cane & assistance as needed  Disposition Home    Diet:   Heart Healthy Low Carb  Accuchecks 4 times/day, Once in AM empty stomach and then before each meal. Log in all results and show them to your Prim.MD in 3 days. If any glucose reading is under 80 or above 300 call your Prim MD immidiately. Follow Low glucose instructions for glucose under 80 as instructed.   For Heart failure patients - Check your Weight same time everyday, if you gain over 2 pounds, or you develop in leg swelling, experience more shortness of breath or chest pain, call your Primary MD immediately. Follow Cardiac Low Salt Diet and 1.5 lit/day fluid restriction.  Special Instructions: If you have smoked or chewed Tobacco  in the last 2 yrs please stop smoking, stop any regular Alcohol  and or any Recreational drug use.  On your next visit with your primary care physician please Get Medicines reviewed and adjusted.  Please request your Prim.MD to go over all Hospital Tests and Procedure/Radiological results at the follow up, please get all Hospital records sent to your Prim MD by signing hospital release before you go home.  If you experience worsening of your admission symptoms, develop shortness of breath, life threatening emergency, suicidal or homicidal thoughts you must seek medical attention immediately by calling 911 or calling your MD immediately  if symptoms less severe.  You Must read complete instructions/literature along with all the possible adverse reactions/side effects for all the Medicines you take and that have been prescribed to you. Take any new Medicines after you have completely understood and accpet all the possible adverse reactions/side effects.   Do not drive, operate heavy machinery, perform activities at heights, swimming or participation in water  activities or provide baby sitting services if your were admitted for syncope or siezures until you have seen by Primary MD or a Neurologist and advised to do so again.  Do not drive when taking Pain medications.

## 2018-01-25 NOTE — Discharge Summary (Signed)
Jeffrey Young IPP:[id number] DOB: Jul 29, 1965 DOA: 01/21/2018  PCP: System, Pcp Not In  Admit date: 01/21/2018  Discharge date: 01/25/2018  Admitted From: Home   Disposition:  Home   Recommendations for Outpatient Follow-up:   Follow up with PCP in 1-2 weeks  PCP Please obtain BMP/CBC, 2 view CXR in 1week,  (see Discharge instructions)   PCP Please follow up on the following pending results:    Home Health: PT,RN   Equipment/Devices: PICC  Consultations: Ortho Discharge Condition: Stable   CODE STATUS: Full   Diet Recommendation: Heart Healthy Low Carb   Chief Complaint  Patient presents with  . Cough  . Wound Check     Brief history of present illness from the day of admission and additional interim summary     52yo M w/ a hx ofDM2 who presented to the ED w/ a 3 day h/o rigors. In June 2019 he was on vacation, flip-flop on L foot broke, and he suffered a puncture wound to bottom of L foot. It has been slow to heal since then, and recently got larger.   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  The pt was transferred from [facility] to [facility] for prompt Ortho evaluation, and taken to the OR emergently 01/22/18 AM.                                                                 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 Sharol Given on 01/23/2018, margins were not entirely clean & he suggested 4 weeks of IV antibiotic treatment.  He was empirically placed on empiric vancomycin and Zosyn IV, PICC right arm placed, intraoperative cultures are  now positive for pansensitive Proteus and he will be switched to 4 weeks total of IV cefazolin through PICC line to be provided at home by home health RN, seen by PT, case discussed with orthopedics will be discharged home with portable wound VAC, follow with PCP and orthopedics within a week of discharge.  He is symptom-free this morning.   ARF.  Baseline creatinine around 1.2, will hydrate and monitor, check random bladder scan, vancomycin levels being monitored by pharmacy.   DM2  -  currently on Lantus and sliding scale.    CBG stable discharge on home Lantus dose, requested to do Accu-Cheks QA CHS maintain a logbook and follow with PCP in a week .  Lab Results  Component Value Date   HGBA1C 7.2 (H) 01/23/2018      Discharge diagnosis     Principal Problem:   Necrotizing fasciitis  of ankle and foot ([place]) Active Problems:   DM2 (diabetes mellitus, type 2) ([place])   Sepsis ([place])   Diabetic ulcer of left foot ([place])   Necrotizing soft tissue infection    Discharge instructions    Discharge Instructions    Discharge instructions   Complete by:  As directed    Follow with Primary MD in 7 days   Get CBC, CMP, checked  by Primary MD in 5-7 days   Activity: As tolerated with Full fall precautions use walker/cane & assistance as needed  Disposition Home    Diet:   Heart Healthy Low Carb  Accuchecks 4 times/day, Once in AM empty stomach and then before each meal. Log in all results and show them to your Prim.MD in 3 days. If any glucose reading is under 80 or above 300 call your Prim MD immidiately. Follow Low glucose instructions for glucose under 80 as instructed.   For Heart failure patients - Check your Weight same time everyday, if you gain over 2 pounds, or you develop in leg swelling, experience more shortness of breath or chest pain, call your Primary MD immediately. Follow Cardiac Low Salt Diet and 1.5 lit/day fluid restriction.  Special Instructions: If you have  smoked or chewed Tobacco  in the last 2 yrs please stop smoking, stop any regular Alcohol  and or any Recreational drug use.  On your next visit with your primary care physician please Get Medicines reviewed and adjusted.  Please request your Prim.MD to go over all Hospital Tests and Procedure/Radiological results at the follow up, please get all Hospital records sent to your Prim MD by signing hospital release before you go home.  If you experience worsening of your admission symptoms, develop shortness of breath, life threatening emergency, suicidal or homicidal thoughts you must seek medical attention immediately by calling 911 or calling your MD immediately  if symptoms less severe.  You Must read complete instructions/literature along with all the possible adverse reactions/side effects for all the Medicines you take and that have been prescribed to you. Take any new Medicines after you have completely understood and accpet all the possible adverse reactions/side effects.   Do not drive, operate heavy machinery, perform activities at heights, swimming or participation in water activities or provide baby sitting services if your were admitted for syncope or siezures until you have seen by Primary MD or a Neurologist and advised to do so again.  Do not drive when taking Pain medications.   Home infusion instructions [facility] May follow [place] Dosing Protocol; May administer Cathflo as needed to maintain patency of vascular access device.; Flushing of vascular access device: per [facility] Protocol: 0.9% NaCl pre/post medica...   Complete by:  As directed    Instructions:  May follow [place] Dosing Protocol   Instructions:  May administer Cathflo as needed to maintain patency of vascular access device.   Instructions:  Flushing of vascular access device: per [facility] Protocol: 0.9% NaCl pre/post medication administration and prn patency; Heparin 100 u/ml, 56m for implanted ports and  Heparin 10u/ml, 586mfor all other central venous catheters.   Instructions:  May follow [facility] Anaphylaxis Protocol for First Dose Administration in the home: 0.9% NaCl at 25-50 ml/hr to maintain IV access for protocol meds. Epinephrine 0.3 ml IV/IM PRN and Benadryl 25-50 IV/IM PRN s/s of anaphylaxis.   Instructions:  [facility] Coordinator (RN) to assist per patient IV care needs in the home PRN.  Increase activity slowly   Complete by:  As directed    Negative Pressure Wound Therapy - Incisional   Complete by:  As directed    Continue wound VAC for 1 week.   Non weight bearing   Complete by:  As directed    Laterality:  left   Extremity:  Lower      Discharge Medications   Allergies as of 01/25/2018      Reactions   Bee Venom Swelling   Cold Sweats      Medication List    TAKE these medications   acetaminophen 500 MG tablet Commonly known as:  TYLENOL Take 2 tablets (1,000 mg total) by mouth every 8 (eight) hours as needed.   ceFAZolin  IVPB Commonly known as:  ANCEF Inject 2 g into the vein every 8 (eight) hours for 27 days. Indication:  osteomyelitis Last Day of Therapy:  02/21/18 Labs - Once weekly:  CBC/D and BMP, Labs - Every other week:  ESR and CRP   cetirizine 10 MG tablet Commonly known as:  ZYRTEC Take 10 mg by mouth daily as needed for allergies.   LANTUS SOLOSTAR 100 UNIT/ML Solostar Pen Generic drug:  Insulin Glargine Inject 20 Units into the skin at bedtime.            Home Infusion Instuctions  (From admission, onward)         Start     Ordered   01/25/18 0000  Home infusion instructions [facility] May follow [place] Dosing Protocol; May administer Cathflo as needed to maintain patency of vascular access device.; Flushing of vascular access device: per [facility] Protocol: 0.9% NaCl pre/post medica...    Question Answer Comment  Instructions May follow [place] Dosing Protocol   Instructions May administer Cathflo as  needed to maintain patency of vascular access device.   Instructions Flushing of vascular access device: per [facility] Protocol: 0.9% NaCl pre/post medication administration and prn patency; Heparin 100 u/ml, 11m for implanted ports and Heparin 10u/ml, 554mfor all other central venous catheters.   Instructions May follow [facility] Anaphylaxis Protocol for First Dose Administration in the home: 0.9% NaCl at 25-50 ml/hr to maintain IV access for protocol meds. Epinephrine 0.3 ml IV/IM PRN and Benadryl 25-50 IV/IM PRN s/s of anaphylaxis.   Instructions [facility] Infusion Coordinator (RN) to assist per patient IV care needs in the home PRN.      01/25/18 1022           Durable Medical Equipment  (From admission, onward)         Start     Ordered   01/24/18 0751  For home use only DME 4 wheeled rolling walker with seat  Once    Question:  Patient needs a walker to treat with the following condition  Answer:  Foot amputation status, left ([place]  01/24/18 0751   01/24/18 0751  For home use only DME lightweight manual wheelchair with seat cushion  Once    Comments:  Patient suffers from L.foot partial amputation which impairs their ability to perform daily activities like bathing, dressing, feeding, grooming and toileting in the home.  A cane, crutch or walker will not resolve  issue with performing activities of daily living. A wheelchair will allow patient to safely perform daily activities. Patient is not able to propel themselves in the home using a standard weight wheelchair due to arm weakness, endurance and general weakness. Patient can self propel in the  lightweight wheelchair.  Accessories: elevating leg rests (ELRs), wheel locks, extensions and anti-tippers.   01/24/18 0751           Discharge Care Instructions  (From admission, onward)         Start     Ordered   01/22/18 0000  Non weight bearing    Question Answer Comment  Laterality left   Extremity Lower      01/22/18 1021           Follow-up Information    Newt Minion, MD In 1 week.   Specialty:  Orthopedic Surgery Contact information: [place] [address] [phone]        [facility], [facility] Follow up.   Specialty:  Home Health Services Why:  Registered Nurse, Physical Therapy Contact information: [address] [place] [address] [place] Follow up.   Why:  [employer]. 3n[address] Contact information: [place] [address] [phone]        [place]. Schedule an appointment as soon as possible for a visit in 1 week(s).   Why:  or your PCP Contact information: [address] [place] [address] [phone]          Major procedures and Radiology Reports - PLEASE review detailed and final reports thoroughly  -        Dg Chest 2 View  Result Date: 01/21/2018 CLINICAL DATA:  Cough. EXAM: CHEST - 2 VIEW COMPARISON:  November 27, 2005 FINDINGS: The heart size and mediastinal contours are within normal limits. Both lungs are clear. The visualized skeletal structures are unremarkable. IMPRESSION: No active cardiopulmonary disease. Electronically Signed   By: Dorise Bullion III M.D   On: 01/21/2018 21:04   Ct Tibia Fibula Left W Contrast  Addendum Date: 01/22/2018   ADDENDUM REPORT: 01/22/2018 00:00 ADDENDUM: Were clinical detail has been given. There is a soft tissue ulceration along the plantar aspect of the forefoot at the base of the second toe however, this does not appear directly contiguous with the soft tissue emphysema seen in the webspace and about the second digit which is more dorsal in position. Therefore, the possibility of a necrotizing fasciitis should be considered. These results were called by telephone at the time of interpretation on 01/21/2018 at 11:58 pm to [place] , who verbally acknowledged  these results. Electronically Signed   By: Ashley Royalty M.D.   On: 01/22/2018 00:00   Result Date: 01/22/2018 CLINICAL DATA:  52 year old male diabetic with left foot ulcer from burn to his left foot on sand at the [place] a few months ago. Patient noticed some bleeding at the wound. EXAM: CT OF THE LOWER LEFT EXTREMITY WITH CONTRAST; CT LEFT ANKLE WITH CONTRAST; CT LEFT FOOT WITH CONTRAST TECHNIQUE: Multidetector CT imaging of the lower left extremity was performed according to the standard protocol following intravenous contrast administration. COMPARISON:  None. CONTRAST:  126m ISOVUE-300 IOPAMIDOL (ISOVUE-300) INJECTION 61% FINDINGS: Imaging of the right lower extremity from mid patellofemoral compartment through the toes after IV contrast. Soft tissue and bone windows are provided with reformats. Soft tissues and bones: Soft tissue emphysema is identified over the dorsum and medial aspect of the second toe as well as within the first webspace extending to the MTP joint level. No drainable fluid collection or abscess. No acute fracture or frank  bone destruction is identified of the adjacent osseous structures, namely the first, second and third toes. Subchondral cyst of the first metatarsal head with sclerosis likely on the basis of osteoarthritis is identified. Intact midfoot and Lisfranc articulation. 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 The tibiotalar and subtalar joints are intact. No abnormality of the talar dome, tibial plafond, tibia nor fibula. The included femoral condyles are unremarkable. The included patella is intact with slight joint space narrowing of the lateral patellofemoral compartment of the knee. There is mild subcutaneous soft tissue induration left leg, ankle and foot without drainable soft tissue fluid collections. No abscess is visualized. Tendons and muscles: The extensor and flexor tendons crossing the ankle joint demonstrate no acute appearing abnormalities.  No pyomyositis, intramuscular abscess nor significant atrophy of the musculature. Ligaments: Noncontributory and not well visualized on CT. IMPRESSION: Subcutaneous emphysema and soft tissue swelling involving the first webspace of the left foot and dorsomedial aspect of the left second toe. No adjacent bone destruction suggestive of osteomyelitis. No drainable fluid collections. Mild degenerative change along the dorsum of the talonavicular joint, first MTP and lateral patellofemoral compartment of the included knee. No acute osseous abnormality. Mild diffuse soft tissue induration compatible cellulitis of the leg, ankle and foot. No drainable abscess or fluid collections. Electronically Signed: By: Ashley Royalty M.D. On: 01/21/2018 23:40   Ct Ankle Left W Contrast  Addendum Date: 01/22/2018   ADDENDUM REPORT: 01/22/2018 00:00 ADDENDUM: Were clinical detail has been given. There is a soft tissue ulceration along the plantar aspect of the forefoot at the base of the second toe however, this does not appear directly contiguous with the soft tissue emphysema seen in the webspace and about the second digit which is more dorsal in position. Therefore, the possibility of a necrotizing fasciitis should be considered. These results were called by telephone at the time of interpretation on 01/21/2018 at 11:58 pm to [place] , who verbally acknowledged these results. Electronically Signed   By: Ashley Royalty M.D.   On: 01/22/2018 00:00   Result Date: 01/22/2018 CLINICAL DATA:  52 year old male diabetic with left foot ulcer from burn to his left foot on sand at the [facility] a few months ago. Patient noticed some bleeding at the wound. EXAM: CT OF THE LOWER LEFT EXTREMITY WITH CONTRAST; CT LEFT ANKLE WITH CONTRAST; CT LEFT FOOT WITH CONTRAST TECHNIQUE: Multidetector CT imaging of the lower left extremity was performed according to the standard protocol following intravenous contrast administration. COMPARISON:  None. CONTRAST:   1101m ISOVUE-300 IOPAMIDOL (ISOVUE-300) INJECTION 61% FINDINGS: Imaging of the right lower extremity from mid patellofemoral compartment through the toes after IV contrast. Soft tissue and bone windows are provided with reformats. Soft tissues and bones: Soft tissue emphysema is identified over the dorsum and medial aspect of the second toe as well as within the first webspace extending to the MTP joint level. No drainable fluid collection or abscess. No acute fracture or frank bone destruction is identified of the adjacent osseous structures, namely the first, second and third toes. Subchondral cyst of the first metatarsal head with sclerosis likely on the basis of osteoarthritis is identified. Intact midfoot and Lisfranc articulation. 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 The tibiotalar and subtalar joints are intact. No abnormality of the talar dome, tibial plafond, tibia nor fibula. The included femoral condyles are unremarkable. The included patella is intact with slight joint space narrowing of the lateral patellofemoral compartment of the knee. There  is mild subcutaneous soft tissue induration left leg, ankle and foot without drainable soft tissue fluid collections. No abscess is visualized. Tendons and muscles: The extensor and flexor tendons crossing the ankle joint demonstrate no acute appearing abnormalities. No pyomyositis, intramuscular abscess nor significant atrophy of the musculature. Ligaments: Noncontributory and not well visualized on CT. IMPRESSION: Subcutaneous emphysema and soft tissue swelling involving the first webspace of the left foot and dorsomedial aspect of the left second toe. No adjacent bone destruction suggestive of osteomyelitis. No drainable fluid collections. Mild degenerative change along the dorsum of the talonavicular joint, first MTP and lateral patellofemoral compartment of the included knee. No acute osseous abnormality. Mild diffuse soft  tissue induration compatible cellulitis of the leg, ankle and foot. No drainable abscess or fluid collections. Electronically Signed: By: Ashley Royalty M.D. On: 01/21/2018 23:40   Ct Foot Left W Contrast  Addendum Date: 01/22/2018   ADDENDUM REPORT: 01/22/2018 00:00 ADDENDUM: Were clinical detail has been given. There is a soft tissue ulceration along the plantar aspect of the forefoot at the base of the second toe however, this does not appear directly contiguous with the soft tissue emphysema seen in the webspace and about the second digit which is more dorsal in position. Therefore, the possibility of a necrotizing fasciitis should be considered. These results were called by telephone at the time of interpretation on 01/21/2018 at 11:58 pm to [place] , who verbally acknowledged these results. Electronically Signed   By: Ashley Royalty M.D.   On: 01/22/2018 00:00   Result Date: 01/22/2018 CLINICAL DATA:  52 year old male diabetic with left foot ulcer from burn to his left foot on sand at the [facility] a few months ago. Patient noticed some bleeding at the wound. EXAM: CT OF THE LOWER LEFT EXTREMITY WITH CONTRAST; CT LEFT ANKLE WITH CONTRAST; CT LEFT FOOT WITH CONTRAST TECHNIQUE: Multidetector CT imaging of the lower left extremity was performed according to the standard protocol following intravenous contrast administration. COMPARISON:  None. CONTRAST:  129m ISOVUE-300 IOPAMIDOL (ISOVUE-300) INJECTION 61% FINDINGS: Imaging of the right lower extremity from mid patellofemoral compartment through the toes after IV contrast. Soft tissue and bone windows are provided with reformats. Soft tissues and bones: Soft tissue emphysema is identified over the dorsum and medial aspect of the second toe as well as within the first webspace extending to the MTP joint level. No drainable fluid collection or abscess. No acute fracture or frank bone destruction is identified of the adjacent osseous structures, namely the first,  second and third toes. Subchondral cyst of the first metatarsal head with sclerosis likely on the basis of osteoarthritis is identified. Intact midfoot and Lisfranc articulation. 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 The tibiotalar and subtalar joints are intact. No abnormality of the talar dome, tibial plafond, tibia nor fibula. The included femoral condyles are unremarkable. The included patella is intact with slight joint space narrowing of the lateral patellofemoral compartment of the knee. There is mild subcutaneous soft tissue induration left leg, ankle and foot without drainable soft tissue fluid collections. No abscess is visualized. Tendons and muscles: The extensor and flexor tendons crossing the ankle joint demonstrate no acute appearing abnormalities. No pyomyositis, intramuscular abscess nor significant atrophy of the musculature. Ligaments: Noncontributory and not well visualized on CT. IMPRESSION: Subcutaneous emphysema and soft tissue swelling involving the first webspace of the left foot and dorsomedial aspect of the left second toe. No adjacent bone destruction suggestive of osteomyelitis. No drainable fluid  collections. Mild degenerative change along the dorsum of the talonavicular joint, first MTP and lateral patellofemoral compartment of the included knee. No acute osseous abnormality. Mild diffuse soft tissue induration compatible cellulitis of the leg, ankle and foot. No drainable abscess or fluid collections. Electronically Signed: By: Ashley Royalty M.D. On: 01/21/2018 23:40   Mr Foot Left W Wo Contrast  Result Date: 01/22/2018 CLINICAL DATA:  Diabetes, fever.  Puncture wound in June 2019. EXAM: MRI OF THE LEFT FOREFOOT WITHOUT AND WITH CONTRAST TECHNIQUE: Multiplanar, multisequence MR imaging of the left forefoot was performed both before and after administration of intravenous contrast. CONTRAST:  53m MULTIHANCE GADOBENATE DIMEGLUMINE 529 MG/ML IV SOLN  COMPARISON:  01/21/2018 FINDINGS: Bones/Joint/Cartilage Abnormal accentuated T2 signal and enhancement in the marrow of the phalanges of the second toe (especially the proximal phalanx) and in the distal phalanx of the great toe compatible with osteomyelitis. Ligaments Lisfranc ligament intact. Muscles and Tendons Low-level edema and enhancement along the first and second intermetatarsal interosseous muscles. Lesser degree of edema and enhancement in the flexor digitorum brevis muscle. Soft tissues Plantar ulceration below the base of the second toe as on image 11/5 with underlying inflammatory phlegmon. Abnormal gas tracking in the second toe and along the web space between the first and second toes, as on recent CT scan. Abnormal subcutaneous edema and enhancement in the dorsum of the foot compatible with cellulitis. IMPRESSION: 1. Osteomyelitis the phalanges of the second toe (especially the proximal phalanx) and of the distal phalanx great toe. 2. Plantar ulceration below the base of the second toe with underlying phlegmon, with soft tissue hypoenhancement and gas tracking in the second toe and in the webspace between the first and second toes. Appearance compatible with cellulitis and gas-forming infection with soft tissue necrosis/fasciitis along the ulcer and tracking in the webspace and second toe. 3. Cellulitis along the dorsum of the foot. Potential myositis along the interosseous muscles and plantar musculature of the foot, although some of the edematous appearance in this vicinity could be neurogenic. Electronically Signed   By: WVan ClinesM.D.   On: 01/22/2018 07:30   Dg Foot Complete Left  Result Date: 01/21/2018 CLINICAL DATA:  Ulcer on left foot.  History of diabetes. EXAM: LEFT FOOT - COMPLETE 3+ VIEW COMPARISON:  None. FINDINGS: There is air in the soft tissues of the second toe and in the webbing between the first and second toes. No associated bony erosion. IMPRESSION: Air in the soft  tissues of the second toe in the webbing between the first and second toes is concerning for infection with a gas-forming organism. No underlying bony erosion identified. Electronically Signed   By: DDorise BullionIII M.D   On: 01/21/2018 21:06   [place] Site Rite  Result Date: 01/23/2018 If Site Rite image not attached, placement could not be confirmed due to current cardiac rhythm.   Micro Results     Recent Results (from the past 240 hour(s))  Blood Culture (routine x 2)     Status: None (Preliminary result)   Collection Time: 01/21/18  9:19 PM  Result Value Ref Range Status   Specimen Description   Final    BLOOD LEFT ANTECUBITAL [facility] [place] [address]   Special Requests   Final    BOTTLES DRAWN AEROBIC AND ANAEROBIC Blood Culture results may not be optimal due to an inadequate volume of blood received in culture bottles Performed at [facility] [facility]  [place] [address], [place], [place] [address]    Culture   Final    NO GROWTH 3 DAYS Performed at [facility], [place] [address], [place], [place] [address]    Report Status PENDING  Incomplete  Blood Culture (routine x 2)     Status: None (Preliminary result)   Collection Time: 01/21/18  9:40 PM  Result Value Ref Range Status   Specimen Description   Final    BLOOD RIGHT ANTECUBITAL Performed at [facility] [address] [place], [place] [address]    Special Requests   Final    BOTTLES DRAWN AEROBIC AND ANAEROBIC Blood Culture adequate volume Performed at [facility] [address] [place], [place] [address]    Culture   Final    NO GROWTH 3 DAYS Performed at [facility], [place] [address], [place], [place] [address]    Report Status PENDING  Incomplete  MRSA PCR Screening     Status: Abnormal   Collection Time: 01/22/18  4:33 AM  Result Value Ref Range Status   MRSA by PCR POSITIVE (A)  NEGATIVE Final    Comment:        The GeneXpert MRSA Assay (FDA approved for NASAL specimens only), is one component of a comprehensive MRSA colonization surveillance program. It is not intended to diagnose MRSA infection nor to guide or monitor treatment for MRSA infections. RESULT CALLED TO, READ BACK BY AND VERIFIED WITHStarlyn Skeans RN [phone] 01/22/18 A BROWNING Performed at [facility], [place] [address], [place], [place] [address]   Aerobic/Anaerobic Culture (surgical/deep wound)     Status: None   Collection Time: 01/22/18 10:07 AM  Result Value Ref Range Status   Specimen Description ABSCESS LEFT FOOT MIDFOOT PLANTAR  Final   Special Requests NONE  Final   Gram Stain   Final    RARE WBC PRESENT, PREDOMINANTLY PMN MODERATE GRAM POSITIVE COCCI MODERATE GRAM NEGATIVE RODS FEW GRAM POSITIVE RODS Performed at [facility], [place] [address], [place], [place] [address]    Culture   Final    MODERATE PROTEUS MIRABILIS MIXED ANAEROBIC FLORA PRESENT.  CALL LAB IF FURTHER IID REQUIRED.    Report Status 01/25/2018 FINAL  Final   Organism ID, Bacteria PROTEUS MIRABILIS  Final      Susceptibility   Proteus mirabilis - MIC*    AMPICILLIN <=2 SENSITIVE Sensitive     CEFAZOLIN <=4 SENSITIVE Sensitive     CEFEPIME <=1 SENSITIVE Sensitive     CEFTAZIDIME <=1 SENSITIVE Sensitive     CEFTRIAXONE <=1 SENSITIVE Sensitive     CIPROFLOXACIN <=0.25 SENSITIVE Sensitive     GENTAMICIN <=1 SENSITIVE Sensitive     IMIPENEM 2 SENSITIVE Sensitive     TRIMETH/SULFA <=20 SENSITIVE Sensitive     AMPICILLIN/SULBACTAM <=2 SENSITIVE Sensitive     PIP/TAZO <=4 SENSITIVE Sensitive     * MODERATE PROTEUS MIRABILIS    Today   Subjective    Shykeem Resurreccion today has no headache,no chest abdominal pain,no new weakness tingling or numbness, feels much better wants to go home today.     Objective   Blood pressure 135/78, pulse 100, temperature 99.3 F (37.4 C), temperature source Oral, resp. rate  20, height '6\' 1"'$  (1.854 m), weight 88 kg, SpO2 99 %.   Intake/Output Summary (Last 24 hours) at 01/25/2018 1022 Last data filed at 01/25/2018 0956 Gross per 24 hour  Intake 2235  ml  Output 2150 ml  Net 85 ml    Exam Awake Alert, Oriented x 3, No new F.N deficits, Normal affect [place].AT,PERRAL Supple Neck,No JVD, No cervical lymphadenopathy appriciated.  Symmetrical Chest wall movement, Good air movement bilaterally, CTAB RRR,No Gallops,Rubs or new Murmurs, No Parasternal Heave +ve B.Sounds, Abd Soft, Non tender, No organomegaly appriciated, No rebound -guarding or rigidity. No Cyanosis, Clubbing or edema, No new Rash or bruise, left foot transmetatarsal amputation site clean under bandage with wound VAC on it   Data Review   CBC w Diff:  Lab Results  Component Value Date   WBC 12.2 (H) 01/24/2018   HGB 9.0 (L) 01/24/2018   HCT 27.6 (L) 01/24/2018   PLT 306 01/24/2018   LYMPHOPCT 7 01/21/2018   MONOPCT 6 01/21/2018   EOSPCT 0 01/21/2018   BASOPCT 0 01/21/2018    CMP:  Lab Results  Component Value Date   NA 133 (L) 01/25/2018   K 3.5 01/25/2018   CL 97 (L) 01/25/2018   CO2 28 01/25/2018   BUN 10 01/25/2018   CREATININE 1.08 01/25/2018   PROT 6.5 01/23/2018   ALBUMIN 2.4 (L) 01/23/2018   BILITOT 1.9 (H) 01/23/2018   ALKPHOS 84 01/23/2018   AST 27 01/23/2018   ALT 11 01/23/2018  .   Total Time in preparing paper work, data evaluation and todays exam - 25 minutes  Lala Lund M.D on 01/25/2018 at 10:22 AM  [facility]   Office  [phone]

## 2018-01-25 NOTE — Progress Notes (Signed)
Patient and wife received discharge information and acknowledged understanding of it. [name] had concerns about getting patient into house and wanted wheelchair. Spoke with Pam from [facility] and was unable to provide at this time. Listed [facility] as other option to get patient home. [name] refused and stated "will figure it out".

## 2018-01-25 NOTE — Progress Notes (Signed)
PHARMACY CONSULT NOTE FOR:  OUTPATIENT  PARENTERAL ANTIBIOTIC THERAPY (OPAT)  Indication: Osteomyelitis Regimen: Cefazolin 2g IV q8 End date: 02/21/18  IV antibiotic discharge orders are pended. To discharging provider:  please sign these orders via discharge navigator,  Select New Orders & click on the button choice - Manage This Unsigned Work.     Onnie Boer, PharmD, [place], AAHIVP, CPP Infectious Disease Pharmacist Pager: [phone] 01/25/2018 10:16 AM

## 2018-01-26 ENCOUNTER — Other Ambulatory Visit: Payer: Self-pay

## 2018-01-26 ENCOUNTER — Emergency Department (HOSPITAL_COMMUNITY)
Admission: EM | Admit: 2018-01-26 | Discharge: 2018-01-26 | Disposition: A | Discharge status: Home / Self Care | Payer: 59 | Attending: Emergency Medicine | Admitting: Emergency Medicine

## 2018-01-26 ENCOUNTER — Encounter (HOSPITAL_COMMUNITY): Payer: Self-pay

## 2018-01-26 DIAGNOSIS — Z91040 Latex allergy status: Secondary | ICD-10-CM | POA: Insufficient documentation

## 2018-01-26 DIAGNOSIS — Z794 Long term (current) use of insulin: Secondary | ICD-10-CM | POA: Diagnosis not present

## 2018-01-26 DIAGNOSIS — Z79899 Other long term (current) drug therapy: Secondary | ICD-10-CM | POA: Diagnosis not present

## 2018-01-26 DIAGNOSIS — E119 Type 2 diabetes mellitus without complications: Secondary | ICD-10-CM | POA: Diagnosis not present

## 2018-01-26 DIAGNOSIS — R7881 Bacteremia: Secondary | ICD-10-CM | POA: Insufficient documentation

## 2018-01-26 LAB — CBC WITH DIFFERENTIAL/PLATELET
Abs Immature Granulocytes: 0.2 10*3/uL — ABNORMAL HIGH (ref 0.0–0.1)
Basophils Absolute: 0.1 10*3/uL (ref 0.0–0.1)
Basophils Relative: 1 %
Eosinophils Absolute: 0.3 10*3/uL (ref 0.0–0.7)
Eosinophils Relative: 2 %
HCT: 30.5 % — ABNORMAL LOW (ref 39.0–52.0)
Hemoglobin: 9.6 g/dL — ABNORMAL LOW (ref 13.0–17.0)
Immature Granulocytes: 2 %
Lymphocytes Relative: 14 %
Lymphs Abs: 1.7 10*3/uL (ref 0.7–4.0)
MCH: 29.3 pg (ref 26.0–34.0)
MCHC: 31.5 g/dL (ref 30.0–36.0)
MCV: 93 fL (ref 78.0–100.0)
Monocytes Absolute: 1.2 10*3/uL — ABNORMAL HIGH (ref 0.1–1.0)
Monocytes Relative: 10 %
Neutro Abs: 8.3 10*3/uL — ABNORMAL HIGH (ref 1.7–7.7)
Neutrophils Relative %: 71 %
Platelets: 353 10*3/uL (ref 150–400)
RBC: 3.28 MIL/uL — ABNORMAL LOW (ref 4.22–5.81)
RDW: 13.4 % (ref 11.5–15.5)
WBC: 11.7 10*3/uL — ABNORMAL HIGH (ref 4.0–10.5)

## 2018-01-26 LAB — CBG MONITORING, ED: Glucose-Capillary: 134 mg/dL — ABNORMAL HIGH (ref 70–99)

## 2018-01-26 LAB — BASIC METABOLIC PANEL
Anion gap: 10 (ref 5–15)
BUN: 12 mg/dL (ref 6–20)
CO2: 28 mmol/L (ref 22–32)
Calcium: 8.9 mg/dL (ref 8.9–10.3)
Chloride: 99 mmol/L (ref 98–111)
Creatinine, Ser: 1.26 mg/dL — ABNORMAL HIGH (ref 0.61–1.24)
GFR calc Af Amer: >60 mL/min (ref >60)
GFR calc non Af Amer: >60 mL/min (ref >60)
Glucose, Bld: 145 mg/dL — ABNORMAL HIGH (ref 70–99)
Potassium: 4 mmol/L (ref 3.5–5.1)
Sodium: 137 mmol/L (ref 135–145)

## 2018-01-26 LAB — CULTURE, BLOOD (ROUTINE X 2)
Culture: NO GROWTH
Special Requests: ADEQUATE

## 2018-01-26 LAB — I-STAT CG4 LACTIC ACID, ED: Lactic Acid, Venous: 1.21 mmol/L (ref 0.5–1.9)

## 2018-01-26 MED ORDER — CEFAZOLIN SODIUM-DEXTROSE 1-4 GM/50ML-% IV SOLN
1.0000 g | Freq: Once | INTRAVENOUS | Status: AC
  Administered 2018-01-26: 1 g via INTRAVENOUS
  Filled 2018-01-26: qty 50

## 2018-01-26 NOTE — Care Management (Signed)
I was called from lab last night, was told that pt's blood culture is positive for Proteus species.  They will send out sensitivity in AM.  Per discharge summary, patient is already on IV CeFAZolin. I called pt home phone, but no body picked up my phone. I left message, telling pt that his blood culture is positive for Proteus species.  If he has fever, he needs to come to the emergency room tonight, if he does not have fever, he needs to come to the hospital in AM.   Ivor Costa, MD  [facility] Pager [phone]  If 7PM-7AM, please contact night-coverage [email or web address] Password [email or web address] 01/26/2018, 7:28 AM

## 2018-01-26 NOTE — Discharge Instructions (Addendum)
Keep taking the antibiotics that you were prescribed, Ancef, as you were instructed to. This medication will appropriately cover the bacteria in your blood.  I have printed off instructions on how to operate your wound vac. If you still have difficulties with operating it, please discuss this further with your home health assistance.  Please attend your scheduled follow-ups next week. Enjoy the rest of your weekend!

## 2018-01-26 NOTE — ED Provider Notes (Signed)
  ED Course/Procedures   Clinical Course as of Jan 26 1733  Sat Jan 26, 2018  1550 Case discussed with Elmyra Ricks Pisciotti, PA-C at shift change. Patient on appropriate antibiotic, Ancef, for infection. Waiting on call from wound team regarding instructions to give patient on operating wound vac.   [GM]  1724 Patient and his wife state that they have a plug at home for the wound vac, but have not tried plugging it in yet. Patient's wound vac is Prevena Plus 125. This provider found operation instructions online for this model and reviewed them with the patient prior to discharge. Patient and wife demonstrated understanding regarding wound vac use.   [GM]    Clinical Course User Index [GM] Conya Ellinwood, Jonelle Sports, PA-C    MDM  Jeffrey Young is a 52 yo male who presented at the behest of another provider for positive blood cultures. Patient discharged from hospital yesterday 01/25/18 following hospitalization for sepsis 2/2 diabetic foot ulcer. During his stay, he underwent left transmetatarsal amputation and was given a portable wound vac at discharge. Patient has no specific physical complaints or any acute changes since discharge yesterday. Patient's current prescribed antibiotic, Ancef, is suspectible to bacteria found in blood culture. Patient can continue home antibiotic as prescribed.  Patient and family have had difficulty operating the wound vac and state that they did not receive appropriate instructions on operation. From inspection of the device, it has not been properly charged and that is the reason for it not working. This provider assisted with providing instructions on the patient's specific wound vac model and how to use it. Advised to plug in device at home so it can be charged and utilized.   Dispo: Home. After thorough clinical evaluation, this patient is determined to be medically stable and can be safely discharged with the previously mentioned treatment and/or outpatient  follow-up/referral(s). At this time, there are no other apparent medical conditions that require further screening, evaluation or treatment.    Maury Dus I, Hershal Coria 01/26/18 1734    Pattricia Boss, MD 01/28/18 2031

## 2018-01-26 NOTE — ED Triage Notes (Signed)
Pt presents with referral here by Dr Blaine Hamper; pt was discharged from here last night; pt was called and told to come here for positive blood culture.  Wife reports pt was incontinent of stool and shortness of breath with exertion.

## 2018-01-26 NOTE — Consult Note (Signed)
[facility] Nurse wound consult note I spoke with patient's primary RN, Larkin Ina for ED C21.  Reason for Consult: Larkin Ina was not sure the [facility] was working. Larkin Ina confirmed during our telephone conversation that the [facility] sponge across the foot surgical site was purple, that the patient had not disconnected the tubing from the small pump, and that the pump was not alarming.  A brief explanation of the Prevena was provided to [place] and at the conclusion of our conversation it was determined that the [facility] consult was not needed.  The Prevena dressing is to remain in place, undisturbed for 7 days after placement in the OR.  Val Riles, RN, MSN, CWOCN, CNS-BC, pager [phone]

## 2018-01-26 NOTE — ED Provider Notes (Signed)
[facility] EMERGENCY DEPARTMENT Provider Note   CSN: [id number] Arrival date & time: 01/26/18  1246     History   Chief Complaint POsitive blood cultures   HPI  Blood pressure 109/73, pulse 80, temperature 98.6 F (37 C), temperature source Oral, resp. rate 17, SpO2 99 %.  Jeffrey Young is a 52 y.o. male presenting to the ED for positive blood cultures.  In brief, patient was admitted to the hospital for necrotizing fasciitis of the left foot he underwent amputation by Dr. Sharol Given, was discharged yesterday with the wound VAC dressing in place also PICC line with cefazolin for home use.  He had 1 dose of the cefazolin last night and did not have his morning dose but instead came to the hospital for evaluation.  He states that in general he has been feeling well but his wife felt that he was warm to the touch last night.  He had his first bowel movement in 4 days yesterday when he woke up this morning he had some streaking along the underwear when he got up to go to the bathroom he did not get to the bathroom quick enough and soiled himself with stool.  His wife states that he felt warm last night but he states that he feels fine in general.  No back pain, he was aware that he had to have a bowel movement, no saddle anesthesia, he does have a mild cough which is not new for him.  The pain from the left foot amputation has been at his baseline and not increasing.  They do note that the tubing on the back came disconnected and they tried to reconnect it quickly.  They states that they are unclear if the [facility] is functioning there were not clear on its normal operating function.  Past Medical History:  Diagnosis Date  . Diabetes mellitus without complication [facility])     Patient Active Problem List   Diagnosis Date Noted  . Necrotizing fasciitis of ankle and foot ([place]) 01/22/2018  . DM2 (diabetes mellitus, type 2) ([place]) 01/22/2018  . Sepsis ([place]) 01/22/2018  . Diabetic ulcer of  left foot ([place]) 01/22/2018  . Necrotizing soft tissue infection     Past Surgical History:  Procedure Laterality Date  . AMPUTATION Left 01/22/2018   Procedure: TRANSMETATARSAL AMPUTATION;  Surgeon: Newt Minion, MD;  Location: [facility];  Service: Orthopedics;  Laterality: Left;        Home Medications    Prior to Admission medications   Medication Sig Start Date End Date Taking? Authorizing Provider  acetaminophen (TYLENOL) 500 MG tablet Take 2 tablets (1,000 mg total) by mouth every 8 (eight) hours as needed. 01/25/18 01/25/19 Yes Thurnell Lose, MD  ceFAZolin (ANCEF) IVPB Inject 2 g into the vein every 8 (eight) hours for 27 days. Indication:  osteomyelitis Last Day of Therapy:  02/21/18 Labs - Once weekly:  CBC/D and BMP, Labs - Every other week:  ESR and CRP 01/25/18 02/21/18 Yes Thurnell Lose, MD  cetirizine (ZYRTEC) 10 MG tablet Take 10 mg by mouth daily as needed for allergies.   Yes [provider]  Insulin Glargine (LANTUS SOLOSTAR) 100 UNIT/ML Solostar Pen Inject 20 Units into the skin at bedtime.    [provider]    Family History History reviewed. No pertinent family history.  Social History Social History   Tobacco Use  . Smoking status: Never Smoker  . Smokeless tobacco: Never Used  Substance Use Topics  .  Alcohol use: Yes  . Drug use: Not on file     Allergies   Bee venom and Latex   Review of Systems Review of Systems  A complete review of systems was obtained and all systems are negative except as noted in the HPI and PMH.   Physical Exam Updated Vital Signs BP (!) 126/116 (BP Location: Left Arm)   Pulse 79   Temp 98.6 F (37 C) (Oral)   Resp 14   SpO2 99%   Physical Exam  Constitutional: He is oriented to person, place, and time. He appears well-developed and well-nourished. No distress.  HENT:  Head: Normocephalic and atraumatic.  Mouth/Throat: Oropharynx is clear and moist.  Eyes: Pupils are equal, round, and  reactive to light. Conjunctivae and EOM are normal.  Neck: Normal range of motion.  Cardiovascular: Normal rate, regular rhythm and intact distal pulses.  Pulmonary/Chest: Effort normal and breath sounds normal.  Abdominal: Soft. There is no tenderness.  Musculoskeletal: Normal range of motion.  Amputation dressed to left foot.  There appears to be a small vacuum in place, this does not appear to be functioning.  Neurological: He is alert and oriented to person, place, and time.  Skin: No rash noted. He is not diaphoretic.  Psychiatric: He has a normal mood and affect.  Nursing note and vitals reviewed.    ED Treatments / Results  Labs (all labs ordered are listed, but only abnormal results are displayed) Labs Reviewed  CBC WITH DIFFERENTIAL/PLATELET - Abnormal; Notable for the following components:      Result Value   WBC 11.7 (*)    RBC 3.28 (*)    Hemoglobin 9.6 (*)    HCT 30.5 (*)    Neutro Abs 8.3 (*)    Monocytes Absolute 1.2 (*)    Abs Immature Granulocytes 0.2 (*)    All other components within normal limits  BASIC METABOLIC PANEL - Abnormal; Notable for the following components:   Glucose, Bld 145 (*)    Creatinine, Ser 1.26 (*)    All other components within normal limits  CBG MONITORING, ED - Abnormal; Notable for the following components:   Glucose-Capillary 134 (*)    All other components within normal limits  CULTURE, BLOOD (ROUTINE X 2)  CULTURE, BLOOD (ROUTINE X 2)  I-STAT CG4 LACTIC ACID, ED    EKG None  Radiology No results found.  Procedures Procedures (including critical care time)  Medications Ordered in ED Medications  ceFAZolin (ANCEF) IVPB 1 g/50 mL premix (0 g Intravenous Stopped 01/26/18 1603)     Initial Impression / Assessment and Plan / ED Course  I have reviewed the triage vital signs and the nursing notes.  Pertinent labs & imaging results that were available during my care of the patient were reviewed by me and considered in my  medical decision making (see chart for details).  Clinical Course as of Jan 27 1608  Sat Jan 26, 2018  1550 Case discussed with Elmyra Ricks Pisciotti, PA-C at shift change. Patient on appropriate antibiotic, Ancef, for    [GM]    Clinical Course User Index [GM] Romie Jumper, [place]    Vitals:   01/26/18 1250 01/26/18 1330 01/26/18 1545  BP: 109/73 109/73 (!) 126/116  Pulse: 80 80 79  Resp: 17  14  Temp: 98.6 F (37 C)    TempSrc: Oral    SpO2: 99% 98% 99%    Medications  ceFAZolin (ANCEF) IVPB 1 g/50 mL premix (0 g  Intravenous Stopped 01/26/18 1603)    Jeffrey Young is 52 y.o. male presenting with positive blood cultures, patient was admitted to the hospital and had a necrotizing fasciitis, he had amputation by due to he was discharged yesterday with Ancef and PICC line.  He states his been his normal state of health.  He got a dose of Ancef last night but he missed his morning dose because the home health nurse came when they were on the way to the hospital.  He states that the wound feels fine it is wrapped and they were instructed not to unwrap it.  It is unclear if the [facility] is functioning, it did become disconnected with them at home.  I have discussed this with the pharmacist and she states that Ancef should cover the Proteus.  I have also discussed this with infectious disease Dr. Johnnye Sima who agrees that Ancef is the appropriate medication for him and as long as he is feeling well he is appropriate for discharge and outpatient follow-up.  I would like to verify that the [facility] is functioning, have paged wound nurse.  Case signed out to PA Mortis at shift change.    Final Clinical Impressions(s) / ED Diagnoses   Final diagnoses:  Positive blood culture    ED Discharge Orders    None       Karen Kays Charna Elizabeth 01/26/18 1609    Quintella Reichert, MD 02/06/18 1201

## 2018-01-27 LAB — CULTURE, BLOOD (ROUTINE X 2)

## 2018-01-29 ENCOUNTER — Encounter (INDEPENDENT_AMBULATORY_CARE_PROVIDER_SITE_OTHER): Payer: Self-pay | Admitting: Physician Assistant

## 2018-01-29 ENCOUNTER — Ambulatory Visit (INDEPENDENT_AMBULATORY_CARE_PROVIDER_SITE_OTHER): Payer: 59 | Admitting: Physician Assistant

## 2018-01-29 VITALS — Ht 73.0 in | Wt 194.0 lb

## 2018-01-29 DIAGNOSIS — E1169 Type 2 diabetes mellitus with other specified complication: Secondary | ICD-10-CM

## 2018-01-29 DIAGNOSIS — Z794 Long term (current) use of insulin: Secondary | ICD-10-CM

## 2018-01-29 DIAGNOSIS — Z89432 Acquired absence of left foot: Secondary | ICD-10-CM

## 2018-01-29 NOTE — Progress Notes (Signed)
Office Visit Note   Patient: Jeffrey Young           Date of Birth: 02/27/1966           MRN: [id number] Visit Date: 01/29/2018              Requested by: No referring provider defined for this encounter. PCP: System, Pcp Not In  Chief Complaint  Patient presents with  . Left Leg - Routine Post Op    TMA surgery 01/22/18      HPI: Jeffrey Young is a 52 year old male who is seen for postoperative follow-up following left foot transmetatarsal amputation with application of the Praveena wound VAC.  Operative cultures grew Proteus mirabilis from the foot cultures as well as from blood cultures.  The patient was initially treated with broad-spectrum IV antibiotic therapy.  He had a PICC line placed and is currently on IV cefazolin per his PICC line and this will be continued for approximately 4 weeks.  His wife who is a Marine scientist has been doing the IV antibiotics but has to return to work.  Home health nursing will be seeing him once weekly to monitor his status.  He has been trying to maintain nonweightbearing status over the left foot.  They report his blood sugars have been doing well at home and have all been less than 200 more recently. He reports no pain and has not taken any pain medication following surgery.  Assessment & Plan: Visit Diagnoses:  1. Type 2 diabetes mellitus with other specified complication, with long-term current use of insulin (Jeffrey Young)   2. S/P transmetatarsal amputation of foot, left (HCC)     Plan: The Praveena VAC dressing was removed and the incision covered with gauze ABD pad and Ace wrapping to control edema.  Counseled patient to continue elevation of the left lower extremity as much as possible higher than the level of his heart.  Counseled patient to continue to work on tight control of his diabetes mellitus.  He does have Glucerna protein supplement since going to drink 1 of these at least once daily.  He will follow-up next week.  Orders written for the patient  to have a light weight wheelchair also for mobility in the household as we do want strict nonweightbearing on the left lower extremity.  Follow-Up Instructions: Return in about 1 week (around 02/05/2018).   Ortho Exam  Patient is alert, oriented, no adenopathy, well-dressed, normal affect, normal respiratory effort. The VAC dressing was removed from the left lower extremity and the transmetatarsal incision had some minimal bloody drainage.  There was some mild maceration of the inferior flap.  No signs of infection or cellulitis.  Patient has diminished sensation over the area.  Good dorsalis pedis pedal pulse in the left foot.  Imaging: No results found. No images are attached to the encounter.  Labs: Lab Results  Component Value Date   HGBA1C 7.2 (H) 01/23/2018   ESRSEDRATE 121 (H) 01/23/2018   CRP 21.4 (H) 01/23/2018   REPTSTATUS PENDING 01/26/2018   GRAMSTAIN  01/22/2018    RARE WBC PRESENT, PREDOMINANTLY PMN MODERATE GRAM POSITIVE COCCI MODERATE GRAM NEGATIVE RODS FEW GRAM POSITIVE RODS Performed at [facility], [place] [address], [place], [place] [address]    CULT  01/26/2018    NO GROWTH 3 DAYS Performed at [facility] [address] [place], [place] [address]    [place] 01/22/2018     Lab Results  Component  Value Date   ALBUMIN 2.4 (L) 01/23/2018   ALBUMIN 3.3 (L) 01/21/2018   PREALBUMIN 6.2 (L) 01/23/2018    Body mass index is 25.6 kg/m.  Orders:  No orders of the defined types were placed in this encounter.  No orders of the defined types were placed in this encounter.    Procedures: No procedures performed  Clinical Data: No additional findings.  ROS:  All other systems negative, except as noted in the HPI. Review of Systems  Objective: Vital Signs: Ht 6\' 1"  (1.854 m)   Wt 194 lb (88 kg)   BMI 25.60 kg/m   Specialty Comments:  No specialty comments available.  PMFS History: Patient Active Problem List    Diagnosis Date Noted  . S/P transmetatarsal amputation of foot, left ([place]) 01/29/2018  . Necrotizing fasciitis of ankle and foot ([place]) 01/22/2018  . DM2 (diabetes mellitus, type 2) ([place]) 01/22/2018  . Sepsis ([place]) 01/22/2018  . Diabetic ulcer of left foot ([place]) 01/22/2018  . Necrotizing soft tissue infection    Past Medical History:  Diagnosis Date  . Diabetes mellitus without complication ([place])     History reviewed. No pertinent family history.  Past Surgical History:  Procedure Laterality Date  . AMPUTATION Left 01/22/2018   Procedure: TRANSMETATARSAL AMPUTATION;  Surgeon: Newt Minion, MD;  Location: [facility];  Service: Orthopedics;  Laterality: Left;   Social History   Occupational History  . Not on file  Tobacco Use  . Smoking status: Never Smoker  . Smokeless tobacco: Never Used  Substance and Sexual Activity  . Alcohol use: Yes  . Drug use: Not on file  . Sexual activity: Not on file

## 2018-01-31 LAB — CULTURE, BLOOD (ROUTINE X 2)
Culture: NO GROWTH
Culture: NO GROWTH
Special Requests: ADEQUATE
Special Requests: ADEQUATE

## 2018-02-04 ENCOUNTER — Telehealth (INDEPENDENT_AMBULATORY_CARE_PROVIDER_SITE_OTHER): Payer: Self-pay | Admitting: Orthopedic Surgery

## 2018-02-04 NOTE — Telephone Encounter (Signed)
The antibiotic that is the best for his infection is 3x day, can patient be trained to give himself the injection

## 2018-02-04 NOTE — Telephone Encounter (Signed)
Please advise 

## 2018-02-04 NOTE — Telephone Encounter (Signed)
Jeffrey Young  ([place] called I spoke with lauren and she advised me that his wife stated patient is unable to administer injection. Patient has appointment tomorrow with Dr.Duda. Mrs.Steel would like [facility] to come to the home to provide injection. Mrs.Savo was advised that they are unable to come to the house.

## 2018-02-04 NOTE — Telephone Encounter (Signed)
Please see below message. Patient has follow up appointment with you in the office tomorrow.

## 2018-02-04 NOTE — Telephone Encounter (Signed)
[facility] case manager called left voicemail message advised patient is taking antibiotic 3 times per day. The mid day dose is causing his wife to miss work. Santiago Glad asked is it possible to change patient;s antibiotic to something he is able to take 2 twelve hours so that his wife can give him before and after work. The number to contact Santiago Glad is [phone]

## 2018-02-05 ENCOUNTER — Ambulatory Visit (INDEPENDENT_AMBULATORY_CARE_PROVIDER_SITE_OTHER): Payer: 59 | Admitting: Physician Assistant

## 2018-02-05 ENCOUNTER — Encounter (INDEPENDENT_AMBULATORY_CARE_PROVIDER_SITE_OTHER): Payer: Self-pay | Admitting: Orthopedic Surgery

## 2018-02-05 DIAGNOSIS — E1169 Type 2 diabetes mellitus with other specified complication: Secondary | ICD-10-CM

## 2018-02-05 DIAGNOSIS — Z89432 Acquired absence of left foot: Secondary | ICD-10-CM

## 2018-02-05 DIAGNOSIS — Z794 Long term (current) use of insulin: Secondary | ICD-10-CM

## 2018-02-06 ENCOUNTER — Encounter (INDEPENDENT_AMBULATORY_CARE_PROVIDER_SITE_OTHER): Payer: Self-pay | Admitting: Physician Assistant

## 2018-02-06 ENCOUNTER — Inpatient Hospital Stay: Payer: 59

## 2018-02-06 NOTE — Progress Notes (Signed)
Office Visit Note   Patient: Jeffrey Young           Date of Birth: [birth year]           MRN: [id number] Visit Date: 02/05/2018              Requested by: No referring provider defined for this encounter. PCP: System, Pcp Not In  No chief complaint on file.     HPI: Patient is a 52 year old male who is seen for postoperative follow-up with his wife today.  He underwent a left transmetatarsal amputation on 01/22/2018.  He reports very little pain and is not taking any pain medications.  He has had some scant drainage from the area.  His sutures are still intact.  On his Achilles is very tight.  They do report some difficulty getting nursing assistance and to administer his IV antibiotics in the mid day dose.  His wife who is a Marine scientist is doing the a.m. and p.m. IV antibiotics for the patient.  He does have a PICC line in place.  This was discussed with Dr. Sharol Given and the patient was counseled that it is okay to skip the midday dose as he appears to be doing extremely well following the transmetatarsal amputation.  Assessment & Plan: Visit Diagnoses:  1. S/P transmetatarsal amputation of foot, left ([place])   2. Type 2 diabetes mellitus with other specified complication, with long-term current use of insulin ([place])     Plan: We will plan to leave the sutures in place for another week.  He will continue elevation as much as possible.  He needs to work on Achilles stretching and was instructed in how to do this.  Continue twice daily IV cefazolin antibiotics.  He will follow-up in 1 week.  Follow-Up Instructions: Return in about 1 week (around 02/12/2018).   Ortho Exam  Patient is alert, oriented, no adenopathy, well-dressed, normal affect, normal respiratory effort. Patient will left foot transmetatarsal amputation site is healing well.  The edema and erythema are markedly improved.  He is very tight over his Achilles and was instructed in how to stretch the Achilles.  Sutures are intact and we  will leave these in for another week.  Imaging: No results found. No images are attached to the encounter.  Labs: Lab Results  Component Value Date   HGBA1C 7.2 (H) 01/23/2018   ESRSEDRATE 121 (H) 01/23/2018   CRP 21.4 (H) 01/23/2018   REPTSTATUS 01/31/2018 FINAL 01/26/2018   GRAMSTAIN  01/22/2018    RARE WBC PRESENT, PREDOMINANTLY PMN MODERATE GRAM POSITIVE COCCI MODERATE GRAM NEGATIVE RODS FEW GRAM POSITIVE RODS Performed at [facility], [place] [address], [place], [place] [address]    CULT  01/26/2018    NO GROWTH 5 DAYS Performed at [facility] [address], [place],  [address]    LABORGA PROTEUS MIRABILIS 01/22/2018     Lab Results  Component Value Date   ALBUMIN 2.4 (L) 01/23/2018   ALBUMIN 3.3 (L) 01/21/2018   PREALBUMIN 6.2 (L) 01/23/2018    There is no height or weight on file to calculate BMI.  Orders:  No orders of the defined types were placed in this encounter.  No orders of the defined types were placed in this encounter.    Procedures: No procedures performed  Clinical Data: No additional findings.  ROS:  All other systems negative, except as noted in the HPI. Review of Systems  Objective: Vital Signs: There were no  vitals taken for this visit.  Specialty Comments:  No specialty comments available.  PMFS History: Patient Active Problem List   Diagnosis Date Noted  . S/P transmetatarsal amputation of foot, left ([place]) 01/29/2018  . Necrotizing fasciitis of ankle and foot ([place]) 01/22/2018  . DM2 (diabetes mellitus, type 2) ([place]) 01/22/2018  . Sepsis ([place]) 01/22/2018  . Diabetic ulcer of left foot ([place]) 01/22/2018  . Necrotizing soft tissue infection    Past Medical History:  Diagnosis Date  . Diabetes mellitus without complication ([place])     History reviewed. No pertinent family history.  Past Surgical History:  Procedure Laterality Date  . AMPUTATION Left 01/22/2018   Procedure: TRANSMETATARSAL AMPUTATION;   Surgeon: Newt Minion, MD;  Location: [facility];  Service: Orthopedics;  Laterality: Left;   Social History   Occupational History  . Not on file  Tobacco Use  . Smoking status: Never Smoker  . Smokeless tobacco: Never Used  Substance and Sexual Activity  . Alcohol use: Yes  . Drug use: Not on file  . Sexual activity: Not on file

## 2018-02-07 ENCOUNTER — Telehealth (INDEPENDENT_AMBULATORY_CARE_PROVIDER_SITE_OTHER): Payer: Self-pay

## 2018-02-07 NOTE — Telephone Encounter (Signed)
FYI-  Amy pharmacist with [facility] wanted to let you know that patient's Creatinine is 1.38.  CB# is [phone].  Thank You.

## 2018-02-08 ENCOUNTER — Telehealth (INDEPENDENT_AMBULATORY_CARE_PROVIDER_SITE_OTHER): Payer: Self-pay | Admitting: Orthopedic Surgery

## 2018-02-08 NOTE — Telephone Encounter (Signed)
Okay to decrease [facility] Nursing visits as noted

## 2018-02-08 NOTE — Telephone Encounter (Signed)
See message below °

## 2018-02-08 NOTE — Telephone Encounter (Signed)
Donita/[facility]/Nurse [phone] called in regard to patient.  Wanting to reduce visits from 2x week to 1X a week. Patient is independent for wound care and they are doing IV's on him.  Please call Donita t advise.

## 2018-02-08 NOTE — Telephone Encounter (Signed)
IC and advised ok to decrease.

## 2018-02-12 ENCOUNTER — Telehealth (INDEPENDENT_AMBULATORY_CARE_PROVIDER_SITE_OTHER): Payer: Self-pay

## 2018-02-12 NOTE — Telephone Encounter (Signed)
Patient should perform a dry dressing change daily.

## 2018-02-12 NOTE — Telephone Encounter (Signed)
Nikki RN with [facility] called and LM on triage line asking for wound care orders for patient. She said she went out to patients home and noticed that he had a new area above the surgical incision of his foot. I tried calling her to get further details but had to LM. Can you please advise? Do you want to see him before providing new orders?  [phone]

## 2018-02-13 ENCOUNTER — Ambulatory Visit (INDEPENDENT_AMBULATORY_CARE_PROVIDER_SITE_OTHER): Payer: 59 | Admitting: Physician Assistant

## 2018-02-13 ENCOUNTER — Encounter (INDEPENDENT_AMBULATORY_CARE_PROVIDER_SITE_OTHER): Payer: Self-pay | Admitting: Orthopedic Surgery

## 2018-02-13 VITALS — Ht 73.0 in | Wt 194.0 lb

## 2018-02-13 DIAGNOSIS — Z89432 Acquired absence of left foot: Secondary | ICD-10-CM

## 2018-02-13 DIAGNOSIS — Z794 Long term (current) use of insulin: Secondary | ICD-10-CM

## 2018-02-13 DIAGNOSIS — E1169 Type 2 diabetes mellitus with other specified complication: Secondary | ICD-10-CM

## 2018-02-13 NOTE — Progress Notes (Signed)
Office Visit Note   Patient: Jeffrey Young           Date of Birth: [birth year]           MRN: [id number] Visit Date: 02/13/2018              Requested by: No referring provider defined for this encounter. PCP: System, Pcp Not In  Chief Complaint  Patient presents with  . Left Foot - Routine Post Op      HPI: Patient is a 52 yo male who is seen for post operative follow up of hsi left transmetatarsal amputation on 01/22/2018.  He is here today with his wife who is a Marine scientist.  The wife reports that he did have an episode of nausea vomiting earlier today.  He has not had any fever chills or other associated symptoms.  He has not had any other episodes of nausea vomiting.  They have been continuing on IV antibiotics per his PICC line.  He is not having any pain.  We did check his blood sugar here in the office and his capillary blood glucose was 112.  He has been trying to keep the left lower extremity elevated as much as possible.  They are elevating during the day as much as possible.  His wife has returned to work.  They have had some minimal bloody drainage noted from the incisional area.  Assessment & Plan: Visit Diagnoses:  1. S/P transmetatarsal amputation of foot, left ([place])   2. Type 2 diabetes mellitus with other specified complication, with long-term current use of insulin (H[facility])     Plan: Continue IV antibiotics per infectious disease.  Continue offloading the left foot is much as possible and elevate as much as possible.  Continue dry dressings with gauze, Kerlix and Ace wrapping daily.  We will leave sutures in and he will follow-up next week.  Counseled patient that if he continues to have nausea or any evidence of fever or chills that she should follow-up sooner.  Follow-Up Instructions: Return in about 8 days (around 02/21/2018).   Ortho Exam  Patient is alert, oriented, no adenopathy, well-dressed, normal affect, normal respiratory effort. Left foot transmetatarsal  amputation has some residual edema distally.  The edema of the calf and ankle are improved.  He has some slight erythema about the incisional areas and sutures are intact and were not removed this visit.  There is no cellulitis of the foot.  He has good pedal pulses.  Imaging: No results found. No images are attached to the encounter.  Labs: Lab Results  Component Value Date   HGBA1C 7.2 (H) 01/23/2018   ESRSEDRATE 121 (H) 01/23/2018   CRP 21.4 (H) 01/23/2018   REPTSTATUS 01/31/2018 FINAL 01/26/2018   GRAMSTAIN  01/22/2018    RARE WBC PRESENT, PREDOMINANTLY PMN MODERATE GRAM POSITIVE COCCI MODERATE GRAM NEGATIVE RODS FEW GRAM POSITIVE RODS Performed at [facility], [place] [address], [place], [place] [address]    CULT  01/26/2018    NO GROWTH 5 DAYS Performed at [facility] [address] [place], [place] [address]    LABORGA PROTEUS MIRABILIS 01/22/2018     Lab Results  Component Value Date   ALBUMIN 2.4 (L) 01/23/2018   ALBUMIN 3.3 (L) 01/21/2018   PREALBUMIN 6.2 (L) 01/23/2018    Body mass index is 25.6 kg/m.  Orders:  No orders of the defined types were placed in this encounter.  No orders of the defined types  were placed in this encounter.    Procedures: No procedures performed  Clinical Data: No additional findings.  ROS:  All other systems negative, except as noted in the HPI. Review of Systems  Objective: Vital Signs: Ht 6\' 1"  (1.854 m)   Wt 194 lb (88 kg)   BMI 25.60 kg/m   Specialty Comments:  No specialty comments available.  PMFS History: Patient Active Problem List   Diagnosis Date Noted  . S/P transmetatarsal amputation of foot, left ([place]) 01/29/2018  . Necrotizing fasciitis of ankle and foot ([place]) 01/22/2018  . DM2 (diabetes mellitus, type 2) ([place]) 01/22/2018  . Sepsis ([place]) 01/22/2018  . Diabetic ulcer of left foot ([place]) 01/22/2018  . Necrotizing soft tissue infection    Past Medical History:  Diagnosis Date  .  Diabetes mellitus without complication ([place])     History reviewed. No pertinent family history.  Past Surgical History:  Procedure Laterality Date  . AMPUTATION Left 01/22/2018   Procedure: TRANSMETATARSAL AMPUTATION;  Surgeon: Newt Minion, MD;  Location: [facility];  Service: Orthopedics;  Laterality: Left;   Social History   Occupational History  . Not on file  Tobacco Use  . Smoking status: Never Smoker  . Smokeless tobacco: Never Used  Substance and Sexual Activity  . Alcohol use: Yes  . Drug use: Not on file  . Sexual activity: Not on file

## 2018-02-13 NOTE — Telephone Encounter (Signed)
IC no answer. LM for [facility].

## 2018-02-14 ENCOUNTER — Ambulatory Visit (INDEPENDENT_AMBULATORY_CARE_PROVIDER_SITE_OTHER): Payer: 59 | Admitting: Orthopedic Surgery

## 2018-02-15 ENCOUNTER — Telehealth (INDEPENDENT_AMBULATORY_CARE_PROVIDER_SITE_OTHER): Payer: Self-pay

## 2018-02-15 NOTE — Telephone Encounter (Signed)
Patient was called pertaining to no show appt/post-op on 02/14/18, no response due to non working number.

## 2018-02-21 ENCOUNTER — Ambulatory Visit (INDEPENDENT_AMBULATORY_CARE_PROVIDER_SITE_OTHER): Payer: 59 | Admitting: Orthopedic Surgery

## 2018-02-21 ENCOUNTER — Encounter (INDEPENDENT_AMBULATORY_CARE_PROVIDER_SITE_OTHER): Payer: Self-pay | Admitting: Orthopedic Surgery

## 2018-02-21 VITALS — Ht 73.0 in | Wt 194.0 lb

## 2018-02-21 DIAGNOSIS — Z89432 Acquired absence of left foot: Secondary | ICD-10-CM

## 2018-02-21 NOTE — Progress Notes (Signed)
Office Visit Note   Patient: Jeffrey Young           Date of Birth: [birth year]           MRN: [id number] Visit Date: 02/21/2018              Requested by: No referring provider defined for this encounter. PCP: System, Pcp Not In  Chief Complaint  Patient presents with  . Left Foot - Routine Post Op    TMA      HPI: Patient is a 52 year old gentleman who is 4 weeks status post left transmetatarsal amputation.  Assessment & Plan: Visit Diagnoses:  1. S/P transmetatarsal amputation of foot, left ([employer])     Plan: Sutures are harvested patient will begin weightbearing as tolerated in about a week.  Follow-Up Instructions: Return in about 4 weeks (around 03/21/2018).   Ortho Exam  Patient is alert, oriented, no adenopathy, well-dressed, normal affect, normal respiratory effort. Examination the incision is well-healed he has good dorsiflexion of the ankle.  There is no redness no cellulitis no drainage no signs of infection.  Sutures are harvested today.  Imaging: No results found. No images are attached to the encounter.  Labs: Lab Results  Component Value Date   HGBA1C 7.2 (H) 01/23/2018   ESRSEDRATE 121 (H) 01/23/2018   CRP 21.4 (H) 01/23/2018   REPTSTATUS 01/31/2018 FINAL 01/26/2018   GRAMSTAIN  01/22/2018    RARE WBC PRESENT, PREDOMINANTLY PMN MODERATE GRAM POSITIVE COCCI MODERATE GRAM NEGATIVE RODS FEW GRAM POSITIVE RODS Performed at [facility], [place] [address], [place], [place] [address]    CULT  01/26/2018    NO GROWTH 5 DAYS Performed at [facility] [address] [place], [place] [address]    LABORGA PROTEUS MIRABILIS 01/22/2018     Lab Results  Component Value Date   ALBUMIN 2.4 (L) 01/23/2018   ALBUMIN 3.3 (L) 01/21/2018   PREALBUMIN 6.2 (L) 01/23/2018    Body mass index is 25.6 kg/m.  Orders:  No orders of the defined types were placed in this encounter.  No orders of the defined types were placed in this  encounter.    Procedures: No procedures performed  Clinical Data: No additional findings.  ROS:  All other systems negative, except as noted in the HPI. Review of Systems  Objective: Vital Signs: Ht 6\' 1"  (1.854 m)   Wt 194 lb (88 kg)   BMI 25.60 kg/m   Specialty Comments:  No specialty comments available.  PMFS History: Patient Active Problem List   Diagnosis Date Noted  . S/P transmetatarsal amputation of foot, left ([place]) 01/29/2018  . Necrotizing fasciitis of ankle and foot ([place]) 01/22/2018  . DM2 (diabetes mellitus, type 2) ([place]) 01/22/2018  . Sepsis ([place]) 01/22/2018  . Diabetic ulcer of left foot ([place]) 01/22/2018  . Necrotizing soft tissue infection    Past Medical History:  Diagnosis Date  . Diabetes mellitus without complication ([place])     History reviewed. No pertinent family history.  Past Surgical History:  Procedure Laterality Date  . AMPUTATION Left 01/22/2018   Procedure: TRANSMETATARSAL AMPUTATION;  Surgeon: Newt Minion, MD;  Location: [facility];  Service: Orthopedics;  Laterality: Left;   Social History   Occupational History  . Not on file  Tobacco Use  . Smoking status: Never Smoker  . Smokeless tobacco: Never Used  Substance and Sexual Activity  . Alcohol use: Yes  . Drug use: Not on file  .  Sexual activity: Not on file

## 2018-02-25 ENCOUNTER — Encounter (INDEPENDENT_AMBULATORY_CARE_PROVIDER_SITE_OTHER): Payer: Self-pay | Admitting: Physician Assistant

## 2018-02-25 ENCOUNTER — Ambulatory Visit (INDEPENDENT_AMBULATORY_CARE_PROVIDER_SITE_OTHER): Payer: 59 | Admitting: Physician Assistant

## 2018-02-25 VITALS — Ht 73.0 in | Wt 194.0 lb

## 2018-02-25 DIAGNOSIS — Z794 Long term (current) use of insulin: Secondary | ICD-10-CM

## 2018-02-25 DIAGNOSIS — Z89432 Acquired absence of left foot: Secondary | ICD-10-CM

## 2018-02-25 DIAGNOSIS — E1169 Type 2 diabetes mellitus with other specified complication: Secondary | ICD-10-CM

## 2018-02-26 ENCOUNTER — Telehealth (INDEPENDENT_AMBULATORY_CARE_PROVIDER_SITE_OTHER): Payer: Self-pay | Admitting: Orthopedic Surgery

## 2018-02-26 ENCOUNTER — Encounter (INDEPENDENT_AMBULATORY_CARE_PROVIDER_SITE_OTHER): Payer: Self-pay | Admitting: Physician Assistant

## 2018-02-26 NOTE — Progress Notes (Signed)
Office Visit Note   Patient: Jeffrey Young           Date of Birth: 02-20-1966           MRN: [id number] Visit Date: 02/25/2018              Requested by: No referring provider defined for this encounter. PCP: System, Pcp Not In  Chief Complaint  Patient presents with  . Left Foot - Routine Post Op    TMA      HPI: Patient is a 52 year old male who seen for postoperative follow-up following left transmetatarsal amputation 01/22/2018.  The patient and wife thought there may have been a suture that was retained.  He continues on IV antibiotic per his PICC line.    Assessment & Plan: Visit Diagnoses:  1. S/P transmetatarsal amputation of foot, left ([place])   2. Type 2 diabetes mellitus with other specified complication, with long-term current use of insulin ([facility])     Plan: There does not appear to be any retained suture.  We are going to begin silver compression sock to the left foot after washing with Dial soap and water daily.  Darco shoe to avoid weightbearing through the amputated area and continue to use walker or crutches.  He will follow-up next Monday or sooner should he have difficulties in the interim.  Follow-Up Instructions: Return in about 1 week (around 03/04/2018).   Ortho Exam  Patient is alert, oriented, no adenopathy, well-dressed, normal affect, normal respiratory effort. The left transmetatarsal amputation site is without retained suture.  He is getting some maceration over the medial incision area and were going to start having him wear a silver compression sock as he also has some mild edema.  There is no signs of cellulitis.  He has a good dorsalis pedis pulse.  Imaging: No results found. No images are attached to the encounter.  Labs: Lab Results  Component Value Date   HGBA1C 7.2 (H) 01/23/2018   ESRSEDRATE 121 (H) 01/23/2018   CRP 21.4 (H) 01/23/2018   REPTSTATUS 01/31/2018 FINAL 01/26/2018   GRAMSTAIN  01/22/2018    RARE WBC PRESENT,  PREDOMINANTLY PMN MODERATE GRAM POSITIVE COCCI MODERATE GRAM NEGATIVE RODS FEW GRAM POSITIVE RODS Performed at [facility], [place] [address], [place], [place] [address]    CULT  01/26/2018    NO GROWTH 5 DAYS Performed at [facility] [address], [place], [place] [address]    LABORGA PROTEUS MIRABILIS 01/22/2018     Lab Results  Component Value Date   ALBUMIN 2.4 (L) 01/23/2018   ALBUMIN 3.3 (L) 01/21/2018   PREALBUMIN 6.2 (L) 01/23/2018    Body mass index is 25.6 kg/m.  Orders:  No orders of the defined types were placed in this encounter.  No orders of the defined types were placed in this encounter.    Procedures: No procedures performed  Clinical Data: No additional findings.  ROS:  All other systems negative, except as noted in the HPI. Review of Systems  Objective: Vital Signs: Ht 6\' 1"  (1.854 m)   Wt 194 lb (88 kg)   BMI 25.60 kg/m   Specialty Comments:  No specialty comments available.  PMFS History: Patient Active Problem List   Diagnosis Date Noted  . S/P transmetatarsal amputation of foot, left ([place]) 01/29/2018  . Necrotizing fasciitis of ankle and foot ([place]) 01/22/2018  . DM2 (diabetes mellitus, type 2) ([place]) 01/22/2018  . Sepsis ([place]) 01/22/2018  . Diabetic  ulcer of left foot ([place]) 01/22/2018  . Necrotizing soft tissue infection    Past Medical History:  Diagnosis Date  . Diabetes mellitus without complication ([place] )     History reviewed. No pertinent family history.  Past Surgical History:  Procedure Laterality Date  . AMPUTATION Left 01/22/2018   Procedure: TRANSMETATARSAL AMPUTATION;  Surgeon: Newt Minion, MD;  Location: [facility];  Service: Orthopedics;  Laterality: Left;   Social History   Occupational History  . Not on file  Tobacco Use  . Smoking status: Never Smoker  . Smokeless tobacco: Never Used  Substance and Sexual Activity  . Alcohol use: Yes  . Drug use: Not on file  . Sexual activity: Not on file

## 2018-02-26 NOTE — Telephone Encounter (Signed)
Patient on IV antibiotics. Couldn't get his blood work from [facility] so they want an order to administer cathflo.  [phone]

## 2018-02-26 NOTE — Telephone Encounter (Signed)
[place]  [phone]   [facility] got a call from the patients nurse that they were having trouble drawing blood from piccne. Melissa wanted to know could they have an order to remove piccne?

## 2018-02-27 NOTE — Telephone Encounter (Signed)
Message to Dr. Sharol Given will sign off on this message

## 2018-02-27 NOTE — Telephone Encounter (Signed)
Called and sw Melissa to advise ok to d/c picc line

## 2018-02-27 NOTE — Telephone Encounter (Signed)
Left transmet healing well. Not treated by infectious dx not sure why he has the picc line do you want to give ok to D/C

## 2018-02-27 NOTE — Telephone Encounter (Signed)
Ok d/c PICC line

## 2018-03-04 ENCOUNTER — Ambulatory Visit (INDEPENDENT_AMBULATORY_CARE_PROVIDER_SITE_OTHER): Payer: 59 | Admitting: Orthopedic Surgery

## 2018-03-14 ENCOUNTER — Encounter (INDEPENDENT_AMBULATORY_CARE_PROVIDER_SITE_OTHER): Payer: Self-pay | Admitting: Orthopedic Surgery

## 2018-03-14 ENCOUNTER — Ambulatory Visit (INDEPENDENT_AMBULATORY_CARE_PROVIDER_SITE_OTHER): Payer: 59 | Admitting: Orthopedic Surgery

## 2018-03-14 VITALS — Ht 73.0 in | Wt 194.0 lb

## 2018-03-14 DIAGNOSIS — M726 Necrotizing fasciitis: Secondary | ICD-10-CM

## 2018-03-14 DIAGNOSIS — Z89432 Acquired absence of left foot: Secondary | ICD-10-CM

## 2018-03-15 ENCOUNTER — Encounter (INDEPENDENT_AMBULATORY_CARE_PROVIDER_SITE_OTHER): Payer: Self-pay | Admitting: Orthopedic Surgery

## 2018-03-15 NOTE — Progress Notes (Signed)
Office Visit Note   Patient: Jeffrey Young           Date of Birth: September 10, 1965           MRN: [id number] Visit Date: 03/14/2018              Requested by: No referring provider defined for this encounter. PCP: System, Pcp Not In  Chief Complaint  Patient presents with  . Left Foot - Routine Post Op, Wound Check      HPI: Patient is a 52 year old gentleman who presents in follow-up he is 7 weeks status post left transmetatarsal amputation patient also has ulceration over the Achilles and dorsally over the ankle.  Assessment & Plan: Visit Diagnoses:  1. S/P transmetatarsal amputation of foot, left ([place])   2. Necrotizing fasciitis of ankle and foot ([place])     Plan: Patient's progressive ischemic ulcers we will set up a appointment with vascular vein surgery.  Patient will continue with the compression stocking elevating his foot level with his heart.  Follow-Up Instructions: Return in about 2 weeks (around 03/28/2018).   Ortho Exam  Patient is alert, oriented, no adenopathy, well-dressed, normal affect, normal respiratory effort. Examination patient has a wound over the dorsum of the ankle notes 10 mm in diameter 0.1 mm deep this has healthy granulation tissue this was touched with silver nitrate.  There is also some ischemic changes over the Achilles.  The transmetatarsal amputation is healed there is some mild callus.  Patient does have a palpable posterior tibial pulse and weak dorsalis pedis pulse.  The compression sock was applied.  Imaging: No results found. No images are attached to the encounter.  Labs: Lab Results  Component Value Date   HGBA1C 7.2 (H) 01/23/2018   ESRSEDRATE 121 (H) 01/23/2018   CRP 21.4 (H) 01/23/2018   REPTSTATUS 01/31/2018 FINAL 01/26/2018   GRAMSTAIN  01/22/2018    RARE WBC PRESENT, PREDOMINANTLY PMN MODERATE GRAM POSITIVE COCCI MODERATE GRAM NEGATIVE RODS FEW GRAM POSITIVE RODS Performed at [facility], [place] [address],  [place], [place] [address]    CULT  01/26/2018    NO GROWTH 5 DAYS Performed at [facility] [address], [place], [place] [address]    LABORGA PROTEUS MIRABILIS 01/22/2018     Lab Results  Component Value Date   ALBUMIN 2.4 (L) 01/23/2018   ALBUMIN 3.3 (L) 01/21/2018   PREALBUMIN 6.2 (L) 01/23/2018    Body mass index is 25.6 kg/m.  Orders:  Orders Placed This Encounter  Procedures  . Ambulatory referral to Vascular Surgery   No orders of the defined types were placed in this encounter.    Procedures: No procedures performed  Clinical Data: No additional findings.  ROS:  All other systems negative, except as noted in the HPI. Review of Systems  Objective: Vital Signs: Ht 6\' 1"  (1.854 m)   Wt 194 lb (88 kg)   BMI 25.60 kg/m   Specialty Comments:  No specialty comments available.  PMFS History: Patient Active Problem List   Diagnosis Date Noted  . S/P transmetatarsal amputation of foot, left ([place]) 01/29/2018  . Necrotizing fasciitis of ankle and foot ([place]) 01/22/2018  . DM2 (diabetes mellitus, type 2) ([place]) 01/22/2018  . Sepsis ([place]) 01/22/2018  . Diabetic ulcer of left foot ([place]) 01/22/2018  . Necrotizing soft tissue infection    Past Medical History:  Diagnosis Date  . Diabetes mellitus without complication ([place])     History reviewed. No  pertinent family history.  Past Surgical History:  Procedure Laterality Date  . AMPUTATION Left 01/22/2018   Procedure: TRANSMETATARSAL AMPUTATION;  Surgeon: Newt Minion, MD;  Location: [facility];  Service: Orthopedics;  Laterality: Left;   Social History   Occupational History  . Not on file  Tobacco Use  . Smoking status: Never Smoker  . Smokeless tobacco: Never Used  Substance and Sexual Activity  . Alcohol use: Yes  . Drug use: Not on file  . Sexual activity: Not on file

## 2018-03-28 ENCOUNTER — Ambulatory Visit (INDEPENDENT_AMBULATORY_CARE_PROVIDER_SITE_OTHER): Payer: 59 | Admitting: Physician Assistant

## 2018-03-28 ENCOUNTER — Encounter (INDEPENDENT_AMBULATORY_CARE_PROVIDER_SITE_OTHER): Payer: Self-pay | Admitting: Physician Assistant

## 2018-03-28 VITALS — Ht 72.0 in | Wt 194.0 lb

## 2018-03-28 DIAGNOSIS — E1169 Type 2 diabetes mellitus with other specified complication: Secondary | ICD-10-CM

## 2018-03-28 DIAGNOSIS — E440 Moderate protein-calorie malnutrition: Secondary | ICD-10-CM

## 2018-03-28 DIAGNOSIS — Z794 Long term (current) use of insulin: Secondary | ICD-10-CM

## 2018-03-28 DIAGNOSIS — L97322 Non-pressure chronic ulcer of left ankle with fat layer exposed: Secondary | ICD-10-CM

## 2018-03-28 DIAGNOSIS — Z89432 Acquired absence of left foot: Secondary | ICD-10-CM

## 2018-03-28 NOTE — Progress Notes (Signed)
Office Visit Note   Patient: Jeffrey Young           Date of Birth: Feb 16, 1966           MRN: [id number] Visit Date: 03/28/2018              Requested by: No referring provider defined for this encounter. PCP: System, Pcp Not In  Chief Complaint  Patient presents with  . Left Foot - Routine Post Op    01/22/18 left transmet amputation       HPI: The patient is a 52 yo male here for follow up of his left transmetatarsal amputation with new ulceration over the Achilles tendon and dorsally over the left ankle. He was referred to [facility] and his appointment is 05/10/18 and they are on a wait list to get in earlier if possible.  He reports no pain over the left foot. He has been washing the foot daily and using a vive silver compression sock and Darco shoe and ambulating partial weight bearing on the left foot with his walker. He is on Glucerna protein supplements.        Assessment & Plan: Visit Diagnoses:  1. S/P transmetatarsal amputation of foot, left ([place])   2. Type 2 diabetes mellitus with other specified complication, with long-term current use of insulin ([place])   3. 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 Moderate protein malnutrition (HCC)     Plan: Continue to wash left foot with Dial soap and water and apply Vive silver compression sock daily. Continue to walk with Darco shoe and walker. Continue protein supplementation. Follow up in 2 weeks.   Follow-Up Instructions: Return in about 2 weeks (around 04/11/2018).   Ortho Exam  Patient is alert, oriented, no adenopathy, well-dressed, normal affect, normal respiratory effort. The left transmetatarsal amputation incision has some minimal crusting but no signs of infection or cellulitis. Palpable dorsalis pedis and posterior tibialis pulses. No erythema , mild edema.  Dry dark eschar about the dorsum of the left foot ~ 10 mm and about left achilles ~ 2 cm diameter without signs of infection  or cellulitis and with some new epithelium about the eschar borders.    Imaging: No results found. No images are attached to the encounter.  Labs: Lab Results  Component Value Date   HGBA1C 7.2 (H) 01/23/2018   ESRSEDRATE 121 (H) 01/23/2018   CRP 21.4 (H) 01/23/2018   REPTSTATUS 01/31/2018 FINAL 01/26/2018   GRAMSTAIN  01/22/2018    RARE WBC PRESENT, PREDOMINANTLY PMN MODERATE GRAM POSITIVE COCCI MODERATE GRAM NEGATIVE RODS FEW GRAM POSITIVE RODS Performed at [facility], [place] [address], [place], [place] [address]    CULT  01/26/2018    NO GROWTH 5 DAYS Performed at [facility] [address], [place], [place] [address]    LABORGA PROTEUS MIRABILIS 01/22/2018     Lab Results  Component Value Date   ALBUMIN 2.4 (L) 01/23/2018   ALBUMIN 3.3 (L) 01/21/2018   PREALBUMIN 6.2 (L) 01/23/2018    Body mass index is 26.31 kg/m.  Orders:  No orders of the defined types were placed in this encounter.  No orders of the defined types were placed in this encounter.    Procedures: No procedures performed  Clinical Data: No additional findings.  ROS:  All other systems negative, except as noted in the HPI. Review of Systems  Objective: Vital Signs: Ht 6' (1.829 m)  Wt 194 lb (88 kg)   BMI 26.31 kg/m   Specialty Comments:  No specialty comments available.  PMFS History: Patient Active Problem List   Diagnosis Date Noted  . S/P transmetatarsal amputation of foot, left ([place]) 01/29/2018  . Necrotizing fasciitis of ankle and foot ([place]) 01/22/2018  . DM2 (diabetes mellitus, type 2) ([place]) 01/22/2018  . Sepsis ([place]) 01/22/2018  . Diabetic ulcer of left foot ([place]) 01/22/2018  . Necrotizing soft tissue infection    Past Medical History:  Diagnosis Date  . Diabetes mellitus without complication ([place])     History reviewed. No pertinent family history.  Past Surgical History:  Procedure Laterality Date  . AMPUTATION Left 01/22/2018   Procedure:  TRANSMETATARSAL AMPUTATION;  Surgeon: Newt Minion, MD;  Location: [facility];  Service: Orthopedics;  Laterality: Left;   Social History   Occupational History  . Not on file  Tobacco Use  . Smoking status: Never Smoker  . Smokeless tobacco: Never Used  Substance and Sexual Activity  . Alcohol use: Yes  . Drug use: Not on file  . Sexual activity: Not on file

## 2018-04-11 ENCOUNTER — Ambulatory Visit (INDEPENDENT_AMBULATORY_CARE_PROVIDER_SITE_OTHER): Payer: 59 | Admitting: Orthopedic Surgery

## 2018-04-11 ENCOUNTER — Encounter (INDEPENDENT_AMBULATORY_CARE_PROVIDER_SITE_OTHER): Payer: Self-pay | Admitting: Physician Assistant

## 2018-04-11 VITALS — Ht 72.0 in | Wt 194.0 lb

## 2018-04-11 DIAGNOSIS — Z89432 Acquired absence of left foot: Secondary | ICD-10-CM

## 2018-04-11 NOTE — Progress Notes (Signed)
Office Visit Note   Patient: Jeffrey Young           Date of Birth: 01/22/1966           MRN: [id number] Visit Date: 04/11/2018              Requested by: No referring provider defined for this encounter. PCP: System, Pcp Not In  Chief Complaint  Patient presents with  . Left Foot - Routine Post Op    01/22/18 left transmet amputation       HPI: Patient is a 52 year old gentleman who presents almost 3 months status post left transmetatarsal dictation he is wearing medical compression stocking Darco shoe was working with biotech orthotic spacer carbon plate and extra-depth shoes.  Assessment & Plan: Visit Diagnoses:  1. S/P transmetatarsal amputation of foot, left ([place])     Plan: Anticipate patient could return to work most likely after the first of the year this would give him time to get this she is fabricated start ambulating have him time to heal the Achilles wound and the transmetatarsal amputation incision.  Follow-Up Instructions: Return in about 4 weeks (around 05/09/2018).   Ortho Exam  Patient is alert, oriented, no adenopathy, well-dressed, normal affect, normal respiratory effort. Examination the black eschar over the Achilles is thin and superficial there is no signs of infection.  The eschar over the medial aspect of the transmetatarsal amputation was debrided with a 10 blade knife patient had good petechial bleeding and this was touched with silver nitrate.  His medical compression stocking was reapplied.  Discussed the importance of exercise and aerobic conditioning and strengthening.  Imaging: No results found. No images are attached to the encounter.  Labs: Lab Results  Component Value Date   HGBA1C 7.2 (H) 01/23/2018   ESRSEDRATE 121 (H) 01/23/2018   CRP 21.4 (H) 01/23/2018   REPTSTATUS 01/31/2018 FINAL 01/26/2018   GRAMSTAIN  01/22/2018    RARE WBC PRESENT, PREDOMINANTLY PMN MODERATE GRAM POSITIVE COCCI MODERATE GRAM NEGATIVE RODS FEW GRAM  POSITIVE RODS Performed at [facility], [place] [address] [place], [place] [address]    CULT  01/26/2018    NO GROWTH 5 DAYS Performed at [facility] [address] [place], [place] [address]    LABORGA PROTEUS MIRABILIS 01/22/2018     Lab Results  Component Value Date   ALBUMIN 2.4 (L) 01/23/2018   ALBUMIN 3.3 (L) 01/21/2018   PREALBUMIN 6.2 (L) 01/23/2018    Body mass index is 26.31 kg/m.  Orders:  No orders of the defined types were placed in this encounter.  No orders of the defined types were placed in this encounter.    Procedures: No procedures performed  Clinical Data: No additional findings.  ROS:  All other systems negative, except as noted in the HPI. Review of Systems  Objective: Vital Signs: Ht 6' (1.829 m)   Wt 194 lb (88 kg)   BMI 26.31 kg/m   Specialty Comments:  No specialty comments available.  PMFS History: Patient Active Problem List   Diagnosis Date Noted  . S/P transmetatarsal amputation of foot, left ([place]) 01/29/2018  . Necrotizing fasciitis of ankle and foot ([place]) 01/22/2018  . DM2 (diabetes mellitus, type 2) ([place]) 01/22/2018  . Sepsis ([place]) 01/22/2018  . Diabetic ulcer of left foot ([place]) 01/22/2018  . Necrotizing soft tissue infection    Past Medical History:  Diagnosis Date  . Diabetes mellitus without complication ([place])     History reviewed.  No pertinent family history.  Past Surgical History:  Procedure Laterality Date  . AMPUTATION Left 01/22/2018   Procedure: TRANSMETATARSAL AMPUTATION;  Surgeon: Newt Minion, MD;  Location: [facility];  Service: Orthopedics;  Laterality: Left;   Social History   Occupational History  . Not on file  Tobacco Use  . Smoking status: Never Smoker  . Smokeless tobacco: Never Used  Substance and Sexual Activity  . Alcohol use: Yes  . Drug use: Not on file  . Sexual activity: Not on file

## 2018-04-25 ENCOUNTER — Other Ambulatory Visit: Payer: Self-pay

## 2018-04-25 DIAGNOSIS — L97423 Non-pressure chronic ulcer of left heel and midfoot with necrosis of muscle: Secondary | ICD-10-CM

## 2018-04-25 DIAGNOSIS — E11621 Type 2 diabetes mellitus with foot ulcer: Secondary | ICD-10-CM

## 2018-04-25 DIAGNOSIS — Z89432 Acquired absence of left foot: Secondary | ICD-10-CM

## 2018-04-25 DIAGNOSIS — M726 Necrotizing fasciitis: Secondary | ICD-10-CM

## 2018-05-10 ENCOUNTER — Ambulatory Visit (HOSPITAL_COMMUNITY)
Admission: RE | Admit: 2018-05-10 | Discharge: 2018-05-10 | Disposition: A | Discharge status: Home / Self Care | Payer: 59 | Source: Ambulatory Visit | Attending: Vascular Surgery | Admitting: Vascular Surgery

## 2018-05-10 ENCOUNTER — Encounter: Payer: Self-pay | Admitting: Vascular Surgery

## 2018-05-10 ENCOUNTER — Ambulatory Visit (INDEPENDENT_AMBULATORY_CARE_PROVIDER_SITE_OTHER): Payer: 59 | Admitting: Vascular Surgery

## 2018-05-10 ENCOUNTER — Encounter

## 2018-05-10 ENCOUNTER — Other Ambulatory Visit: Payer: Self-pay

## 2018-05-10 VITALS — BP 135/90 | HR 77 | Temp 97.8°F | Resp 20 | Ht 72.0 in | Wt 194.0 lb

## 2018-05-10 DIAGNOSIS — Z89432 Acquired absence of left foot: Secondary | ICD-10-CM | POA: Diagnosis not present

## 2018-05-10 DIAGNOSIS — M726 Necrotizing fasciitis: Secondary | ICD-10-CM | POA: Diagnosis not present

## 2018-05-10 DIAGNOSIS — L97423 Non-pressure chronic ulcer of left heel and midfoot with necrosis of muscle: Secondary | ICD-10-CM

## 2018-05-10 DIAGNOSIS — E11621 Type 2 diabetes mellitus with foot ulcer: Secondary | ICD-10-CM

## 2018-05-10 NOTE — Progress Notes (Signed)
Patient ID: Jeffrey Young, male   DOB: Apr 06, 1966, 52 y.o.   MRN: [id number]  Reason for Consult: New Patient (Initial Visit) (left transmet amputation with ischemic changes)   Referred by Newt Minion, MD  Subjective:     HPI:  Jeffrey Young is a 52 y.o. male presents for evaluation of left nonhealing transmetatarsal amputation.  Thankfully this is mostly healed since the appointment was made.  He initially a transmetatarsal amputation performed by Dr. Sharol Given after becoming septic from a left foot ulceration that was from a low-grade trauma.  He is diabetic does have neuropathy.  He is never had vascular surgery before.  He does have diabetes as a risk factor.  Remains active although he has not returned to work he does work at the [employer] more on [employer] night shift typically.  Does have some remaining areas of ulceration but mostly is healed his TMA.  Past Medical History:  Diagnosis Date  . Diabetes mellitus without complication ([place])    History reviewed. No pertinent family history. Past Surgical History:  Procedure Laterality Date  . AMPUTATION Left 01/22/2018   Procedure: TRANSMETATARSAL AMPUTATION;  Surgeon: Newt Minion, MD;  Location: [facility];  Service: Orthopedics;  Laterality: Left;    Short Social History:  Social History   Tobacco Use  . Smoking status: Never Smoker  . Smokeless tobacco: Never Used  Substance Use Topics  . Alcohol use: Yes    Allergies  Allergen Reactions  . Bee Venom Swelling    Cold Sweats  . Latex Rash    Current Outpatient Medications  Medication Sig Dispense Refill  . acetaminophen (TYLENOL) 500 MG tablet Take 2 tablets (1,000 mg total) by mouth every 8 (eight) hours as needed. 25 tablet 0  . cetirizine (ZYRTEC) 10 MG tablet Take 10 mg by mouth daily as needed for allergies.    . Insulin Glargine (LANTUS SOLOSTAR) 100 UNIT/ML Solostar Pen Inject 20 Units into the skin at bedtime.     No current facility-administered medications  for this visit.     Review of Systems  Constitutional:  Constitutional negative. HENT: HENT negative.  Eyes: Eyes negative.  Respiratory: Respiratory negative.  Cardiovascular: Cardiovascular negative.  GI: Gastrointestinal negative.  Musculoskeletal: Musculoskeletal negative.  Skin: Positive for wound.  Neurological: Positive for numbness.  Hematologic: Hematologic/lymphatic negative.  Psychiatric: Psychiatric negative.        Objective:  Objective   Vitals:   05/10/18 1442  BP: 135/90  Pulse: 77  Resp: 20  Temp: 97.8 F (36.6 C)  SpO2: 100%  Weight: 194 lb (88 kg)  Height: 6' (1.829 m)   Body mass index is 26.31 kg/m.  Physical Exam HENT:     Head: Normocephalic and atraumatic.  Eyes:     Pupils: Pupils are equal, round, and reactive to light.  Neck:     Musculoskeletal: Normal range of motion.  Cardiovascular:     Rate and Rhythm: Normal rate.     Pulses:          Popliteal pulses are 2+ on the right side and 2+ on the left side.       Dorsalis pedis pulses are 2+ on the right side and 2+ on the left side.       Posterior tibial pulses are 2+ on the left side.  Pulmonary:     Effort: Pulmonary effort is normal.  Abdominal:     General: Abdomen is flat.     Palpations: Abdomen is soft.  Musculoskeletal:        General: No swelling.     Comments: There is a left TMA does have one area of scab still.  There is a scab on the Achilles as well 1 cm diameter.  Skin:    General: Skin is warm and dry.  Neurological:     General: No focal deficit present.     Mental Status: He is alert and oriented to person, place, and time.  Psychiatric:        Mood and Affect: Mood normal.        Behavior: Behavior normal.        Thought Content: Thought content normal.        Judgment: Judgment normal.     Data: I have independently interpreted his bilateral lower extremity ABIs which are greater than 1 bilaterally and triphasic     Assessment/Plan:      52 year old male follows up for nonhealing transmetatarsal amputation performed for septic wound on his left foot from low-grade trauma.  Thankfully this is mostly healed he appears to have adequate blood flow for continued healing.  I discussed with him protecting his feet.  He demonstrates good understanding and can see him on an as-needed basis.     Waynetta Sandy MD [facility]

## 2018-05-13 ENCOUNTER — Other Ambulatory Visit: Payer: Self-pay | Admitting: Gastroenterology

## 2018-05-17 ENCOUNTER — Encounter (HOSPITAL_COMMUNITY): Payer: Self-pay

## 2018-05-17 ENCOUNTER — Other Ambulatory Visit: Payer: Self-pay

## 2018-05-24 ENCOUNTER — Encounter (HOSPITAL_COMMUNITY)
Admission: RE | Disposition: A | Discharge status: Home / Self Care | Payer: Self-pay | Source: Home / Self Care | Attending: Gastroenterology

## 2018-05-24 ENCOUNTER — Ambulatory Visit (HOSPITAL_COMMUNITY): Payer: 59 | Admitting: Anesthesiology

## 2018-05-24 ENCOUNTER — Ambulatory Visit (HOSPITAL_COMMUNITY)
Admission: RE | Admit: 2018-05-24 | Discharge: 2018-05-24 | Disposition: A | Discharge status: Home / Self Care | Payer: 59 | Attending: Gastroenterology | Admitting: Gastroenterology

## 2018-05-24 ENCOUNTER — Encounter (HOSPITAL_COMMUNITY): Payer: Self-pay | Admitting: Certified Registered Nurse Anesthetist

## 2018-05-24 ENCOUNTER — Other Ambulatory Visit: Payer: Self-pay

## 2018-05-24 DIAGNOSIS — Z794 Long term (current) use of insulin: Secondary | ICD-10-CM | POA: Diagnosis not present

## 2018-05-24 DIAGNOSIS — E119 Type 2 diabetes mellitus without complications: Secondary | ICD-10-CM | POA: Insufficient documentation

## 2018-05-24 DIAGNOSIS — Z86010 Personal history of colonic polyps: Secondary | ICD-10-CM | POA: Insufficient documentation

## 2018-05-24 DIAGNOSIS — D123 Benign neoplasm of transverse colon: Secondary | ICD-10-CM | POA: Insufficient documentation

## 2018-05-24 HISTORY — PX: POLYPECTOMY: SHX5525

## 2018-05-24 HISTORY — PX: COLONOSCOPY WITH PROPOFOL: SHX5780

## 2018-05-24 LAB — GLUCOSE, CAPILLARY: Glucose-Capillary: 139 mg/dL — ABNORMAL HIGH (ref 70–99)

## 2018-05-24 SURGERY — COLONOSCOPY WITH PROPOFOL
Anesthesia: Monitor Anesthesia Care

## 2018-05-24 MED ORDER — SODIUM CHLORIDE 0.9 % IV SOLN: INTRAVENOUS | Status: DC

## 2018-05-24 MED ORDER — PROPOFOL 500 MG/50ML IV EMUL
INTRAVENOUS | Status: DC | PRN
  Administered 2018-05-24: 30 mg via INTRAVENOUS

## 2018-05-24 MED ORDER — PROPOFOL 500 MG/50ML IV EMUL
INTRAVENOUS | Status: DC | PRN
  Administered 2018-05-24: 175 ug/kg/min via INTRAVENOUS

## 2018-05-24 MED ORDER — SPOT INK MARKER SYRINGE KIT
PACK | SUBMUCOSAL | Status: DC | PRN
  Administered 2018-05-24: 2 mL via SUBMUCOSAL

## 2018-05-24 MED ORDER — PHENYLEPHRINE 40 MCG/ML (10ML) SYRINGE FOR IV PUSH (FOR BLOOD PRESSURE SUPPORT)
PREFILLED_SYRINGE | INTRAVENOUS | Status: DC | PRN
  Administered 2018-05-24: 80 ug via INTRAVENOUS

## 2018-05-24 MED ORDER — GLUCAGON HCL RDNA (DIAGNOSTIC) 1 MG IJ SOLR
INTRAMUSCULAR | Status: AC
  Filled 2018-05-24: qty 1

## 2018-05-24 MED ORDER — GLUCAGON HCL RDNA (DIAGNOSTIC) 1 MG IJ SOLR
INTRAMUSCULAR | Status: DC | PRN
  Administered 2018-05-24: 1 mg via INTRAVENOUS

## 2018-05-24 MED ORDER — SPOT INK MARKER SYRINGE KIT
PACK | SUBMUCOSAL | Status: AC
  Filled 2018-05-24: qty 5

## 2018-05-24 MED ORDER — LACTATED RINGERS IV SOLN
INTRAVENOUS | Status: DC
  Administered 2018-05-24: 1000 mL via INTRAVENOUS

## 2018-05-24 SURGICAL SUPPLY — 21 items

## 2018-05-24 NOTE — Transfer of Care (Signed)
Immediate Anesthesia Transfer of Care Note  Patient: Jeffrey Young  Procedure(s) Performed: COLONOSCOPY WITH PROPOFOL (N/A ) SUBMUCOSAL TATTOO INJECTION POLYPECTOMY  Patient Location: PACU  Anesthesia Type:MAC  Level of Consciousness: sedated, patient cooperative and responds to stimulation  Airway & Oxygen Therapy: Patient Spontanous Breathing and Patient connected to face mask oxygen  Post-op Assessment: Report given to RN and Post -op Vital signs reviewed and stable  Post vital signs: Reviewed and stable  Last Vitals:  Vitals Value Taken Time  BP    Temp    Pulse 74 05/24/2018 10:08 AM  Resp 13 05/24/2018 10:08 AM  SpO2 100 % 05/24/2018 10:08 AM  Vitals shown include unvalidated device data.  Last Pain:  Vitals:   05/24/18 1008  TempSrc:   PainSc: Asleep         Complications: No apparent anesthesia complications

## 2018-05-24 NOTE — Op Note (Signed)
[facility] Patient Name: Jeffrey Young Procedure Date: 05/24/2018 MRN: [id number] Attending MD: Carol Ada , MD Date of Birth: 11-04-1965 CSN: [id number] Age: 53 Admit Type: Outpatient Procedure:                Colonoscopy Indications:              Therapeutic procedure for colon polyps Providers:                Carol Ada, MD, Cleda Daub, RN, William Dalton, Technician Referring MD:              Medicines:                Propofol per Anesthesia Complications:            No immediate complications. Estimated Blood Loss:     Estimated blood loss: none. Procedure:                Pre-Anesthesia Assessment:                           - Prior to the procedure, a History and Physical                            was performed, and patient medications and                            allergies were reviewed. The patient's tolerance of                            previous anesthesia was also reviewed. The risks                            and benefits of the procedure and the sedation                            options and risks were discussed with the patient.                            All questions were answered, and informed consent                            was obtained. Prior Anticoagulants: The patient has                            taken no previous anticoagulant or antiplatelet                            agents. ASA Grade Assessment: II - A patient with                            mild systemic disease. After reviewing the risks  and benefits, the patient was deemed in                            satisfactory condition to undergo the procedure.                           - Sedation was administered by an anesthesia                            professional. Deep sedation was attained.                           After obtaining informed consent, the colonoscope                            was passed under direct vision.  Throughout the                            procedure, the patient's blood pressure, pulse, and                            oxygen saturations were monitored continuously. The                            CF-HQ190L ([id number] Olympus Adult Colonoscope was                            introduced through the anus and advanced to the the                            cecum, identified by appendiceal orifice and                            ileocecal valve. The colonoscopy was performed with                            difficulty. The ileocecal valve, appendiceal                            orifice, and rectum were photographed. The patient                            tolerated the procedure well. The quality of the                            bowel preparation was good. Scope In: 9:23:20 AM Scope Out: 9:59:40 AM Scope Withdrawal Time: 0 hours 30 minutes 27 seconds  Total Procedure Duration: 0 hours 36 minutes 20 seconds  Findings:      A 3 mm polyp was found in the transverse colon. The polyp was sessile.       The polyp was removed with a cold snare. Resection and retrieval were       complete.      A 30 mm polyp was found in the descending colon. The polyp was sessile.  Area was tattooed with an injection of 2 mL of [place] ink.      The large descending colon polyp was again identified. It was initially       diffiuclt to see the polyp as the prior tattoo was not initially       visualized. The location of this polyp was in the proximal descending       colon at 50-60 cm, with the colonoscope reduced during the withdrawal.       The entire polyp edge was not able to be seen as teh polyp extended over       a fold. The most difficult aspect of this procedure was that the colon       was continually contracting. The use of glucagon did not help to slow or       arrest the colonic contractions. The size of the polyp appeared to be       larger than previously thought. With the combination of these  issues,       the decision was made not to pursue the polypectomy. The area was       retattooed. Impression:               - One 3 mm polyp in the transverse colon, removed                            with a cold snare. Resected and retrieved.                           - One 30 mm polyp in the descending colon. Tattooed. Moderate Sedation:      Not Applicable - Patient had care per Anesthesia. Recommendation:           - Repeat colonoscopy in 1-3 year for surveillance.                           - Segmental colonic resection versus ESD. Procedure Code(s):        --- Professional ---                           [phone], Colonoscopy, flexible; with removal of                            tumor(s), polyp(s), or other lesion(s) by snare                            technique                           45381, Colonoscopy, flexible; with directed                            submucosal injection(s), any substance Diagnosis Code(s):        --- Professional ---                           D12.3, Benign neoplasm of transverse colon (hepatic  flexure or splenic flexure)                           D12.4, Benign neoplasm of descending colon                           K63.5, Polyp of colon CPT copyright 2018 American Medical Association. All rights reserved. The codes documented in this report are preliminary and upon coder review may  be revised to meet current compliance requirements. Carol Ada, MD Carol Ada, MD 05/24/2018 10:10:26 AM This report has been signed electronically. Number of Addenda: 0

## 2018-05-24 NOTE — Anesthesia Preprocedure Evaluation (Addendum)
Anesthesia Evaluation  Patient identified by MRN, date of birth, ID band Patient awake    Reviewed: Allergy & Precautions, NPO status , Patient's Chart, lab work & pertinent test results  History of Anesthesia Complications Negative for: history of anesthetic complications  Airway Mallampati: II  TM Distance: >3 FB Neck ROM: Full    Dental  (+) Dental Advisory Given, Missing   Pulmonary neg pulmonary ROS,    Pulmonary exam normal        Cardiovascular negative cardio ROS Normal cardiovascular exam     Neuro/Psych negative neurological ROS     GI/Hepatic negative GI ROS, Neg liver ROS,   Endo/Other  diabetes  Renal/GU negative Renal ROS     Musculoskeletal negative musculoskeletal ROS (+)   Abdominal   Peds  Hematology negative hematology ROS (+)   Anesthesia Other Findings Day of surgery medications reviewed with the patient.  Reproductive/Obstetrics                            Anesthesia Physical Anesthesia Plan  ASA: II  Anesthesia Plan: MAC   Post-op Pain Management:    Induction:   PONV Risk Score and Plan: 2 and Ondansetron and Propofol infusion  Airway Management Planned: Natural Airway and Simple Face Mask  Additional Equipment:   Intra-op Plan:   Post-operative Plan:   Informed Consent: I have reviewed the patients History and Physical, chart, labs and discussed the procedure including the risks, benefits and alternatives for the proposed anesthesia with the patient or authorized representative who has indicated his/her understanding and acceptance.   Dental advisory given  Plan Discussed with: CRNA, Anesthesiologist and Surgeon  Anesthesia Plan Comments:        Anesthesia Quick Evaluation

## 2018-05-24 NOTE — H&P (Signed)
  Jeffrey Young HPI:  On 04/25/2018 the patient was noted to have a large 2.5 cm sessile polyp, but it was not able to be resected. He is here today to undergo resection of the polyp.   Past Medical History:  Diagnosis Date  . Diabetes mellitus without complication ([place])    type 2    Past Surgical History:  Procedure Laterality Date  . AMPUTATION Left 01/22/2018   Procedure: TRANSMETATARSAL AMPUTATION;  Surgeon: Newt Minion, MD;  Location: [facility];  Service: Orthopedics;  Laterality: Left;toes    History reviewed. No pertinent family history.  Social History:  reports that he has never smoked. He has never used smokeless tobacco. He reports current alcohol use of about 1.0 standard drinks of alcohol per week. He reports that he does not use drugs.  Allergies:  Allergies  Allergen Reactions  . Bee Venom Swelling    Cold Sweats  . Latex Rash    Medications:  Scheduled:  Continuous: . sodium chloride    . lactated ringers 1,000 mL (05/24/18 0912)    No results found for this or any previous visit (from the past 24 hour(s)).   No results found.  ROS:  As stated above in the HPI otherwise negative.  Blood pressure (!) 174/88, pulse 88, temperature 98.4 F (36.9 C), temperature source Oral, resp. rate 13, height 6\' 1"  (1.854 m), weight 91.6 kg, SpO2 100 %.    PE: Gen: NAD, Alert and Oriented HEENT:  [place]/AT, EOMI Neck: Supple, no LAD Lungs: CTA Bilaterally CV: RRR without M/G/R ABM: Soft, NTND, +BS Ext: No C/C/E  Assessment/Plan: 1) Colonic polyp - colonoscopy with resection.   Jeffrey Young D 05/24/2018, 9:12 AM

## 2018-05-24 NOTE — Discharge Instructions (Signed)
Colonoscopy, Adult, Care After °This sheet gives you information about how to care for yourself after your procedure. Your doctor may also give you more specific instructions. If you have problems or questions, call your doctor. °What can I expect after the procedure? °After the procedure, it is common to have: °· A small amount of blood in your poop for 24 hours. °· Some gas. °· Mild cramping or bloating in your belly. °Follow these instructions at home: °General instructions °· For the first 24 hours after the procedure: °? Do not drive or use machinery. °? Do not sign important documents. °? Do not drink alcohol. °? Do your daily activities more slowly than normal. °? Eat foods that are soft and easy to digest. °· Take over-the-counter or prescription medicines only as told by your doctor. °To help cramping and bloating: ° °· Try walking around. °· Put heat on your belly (abdomen) as told by your doctor. Use a heat source that your doctor recommends, such as a moist heat pack or a heating pad. °? Put a towel between your skin and the heat source. °? Leave the heat on for 20-30 minutes. °? Remove the heat if your skin turns bright red. This is especially important if you cannot feel pain, heat, or cold. You can get burned. °Eating and drinking ° °· Drink enough fluid to keep your pee (urine) clear or pale yellow. °· Return to your normal diet as told by your doctor. Avoid heavy or fried foods that are hard to digest. °· Avoid drinking alcohol for as long as told by your doctor. °Contact a doctor if: °· You have blood in your poop (stool) 2-3 days after the procedure. °Get help right away if: °· You have more than a small amount of blood in your poop. °· You see large clumps of tissue (blood clots) in your poop. °· Your belly is swollen. °· You feel sick to your stomach (nauseous). °· You throw up (vomit). °· You have a fever. °· You have belly pain that gets worse, and medicine does not help your  pain. °Summary °· After the procedure, it is common to have a small amount of blood in your poop. You may also have mild cramping and bloating in your belly. °· For the first 24 hours after the procedure, do not drive or use machinery, do not sign important documents, and do not drink alcohol. °· Get help right away if you have a lot of blood in your poop, feel sick to your stomach, have a fever, or have more belly pain. °This information is not intended to replace advice given to you by your health care provider. Make sure you discuss any questions you have with your health care provider. °Document Released: 06/10/2010 Document Revised: 03/08/2017 Document Reviewed: 01/31/2016 °Elsevier Interactive Patient Education © 2019 [employer] Inc[employer] ° °

## 2018-05-24 NOTE — Anesthesia Postprocedure Evaluation (Signed)
Anesthesia Post Note  Patient: Jeffrey Young  Procedure(s) Performed: COLONOSCOPY WITH PROPOFOL (N/A ) SUBMUCOSAL TATTOO INJECTION POLYPECTOMY     Patient location during evaluation: Endoscopy Anesthesia Type: MAC Level of consciousness: awake and alert Pain management: pain level controlled Vital Signs Assessment: post-procedure vital signs reviewed and stable Respiratory status: spontaneous breathing, nonlabored ventilation, respiratory function stable and patient connected to nasal cannula oxygen Cardiovascular status: blood pressure returned to baseline and stable Postop Assessment: no apparent nausea or vomiting Anesthetic complications: no    Last Vitals:  Vitals:   05/24/18 1030 05/24/18 1040  BP: 131/69 (!) 142/93  Pulse:  78  Resp: 17 18  Temp:    SpO2:  100%    Last Pain:  Vitals:   05/24/18 1040  TempSrc:   PainSc: 0-No pain                 Calub Tarnow DANIEL

## 2018-05-27 ENCOUNTER — Ambulatory Visit (INDEPENDENT_AMBULATORY_CARE_PROVIDER_SITE_OTHER): Payer: 59 | Admitting: Orthopedic Surgery

## 2018-05-27 ENCOUNTER — Encounter (INDEPENDENT_AMBULATORY_CARE_PROVIDER_SITE_OTHER): Payer: Self-pay | Admitting: Orthopedic Surgery

## 2018-05-27 VITALS — Ht 73.0 in | Wt 202.0 lb

## 2018-05-27 DIAGNOSIS — Z89432 Acquired absence of left foot: Secondary | ICD-10-CM

## 2018-05-27 NOTE — Progress Notes (Signed)
Office Visit Note   Patient: Jeffrey Young           Date of Birth: [birth year]           MRN: [id number] Visit Date: 05/27/2018              Requested by: No referring provider defined for this encounter. PCP: [name], MD  Chief Complaint  Patient presents with  . Left Foot - Follow-up      HPI: Patient is a 53 year old gentleman status post transmetatarsal amputation left foot.  He is currently wearing double upright braces extra-depth shoes custom orthotics patient is quite pleased with his progress.  He does note a blister over the posterior aspect of his heel that he states was secondary to previous dressing changes.  Assessment & Plan: Visit Diagnoses:  1. S/P transmetatarsal amputation of foot, left (HCC)     Plan: Recommended wearing a Band-Aid to protect the blister patient is given a note that he may return to work 4 hours a day with no lifting greater than 10 pounds essentially seated work.  Follow-Up Instructions: Return in about 4 weeks (around 06/24/2018).   Ortho Exam  Patient is alert, oriented, no adenopathy, well-dressed, normal affect, normal respiratory effort. Examination patient ambulates with a slight limp he is wearing the double upright braces without problems his foot has dorsiflexion to neutral and recommended continued Achilles stretching.  He has a very superficial 5 mm blister over the posterior aspect of the heel with 100% granulation tissue.  Recommend wearing a Band-Aid to protect this area from his shoe wear.  There is no redness no cellulitis no signs of infection.  Imaging: No results found. No images are attached to the encounter.  Labs: Lab Results  Component Value Date   HGBA1C 7.2 (H) 01/23/2018   ESRSEDRATE 121 (H) 01/23/2018   CRP 21.4 (H) 01/23/2018   REPTSTATUS 01/31/2018 FINAL 01/26/2018   GRAMSTAIN  01/22/2018    RARE WBC PRESENT, PREDOMINANTLY PMN MODERATE GRAM POSITIVE COCCI MODERATE GRAM NEGATIVE RODS FEW GRAM  POSITIVE RODS Performed at [facility], [place] [address] [place], [place] [address]    CULT  01/26/2018    NO GROWTH 5 DAYS Performed at [facility] [address], [place], [place] [address]    LABORGA PROTEUS MIRABILIS 01/22/2018     Lab Results  Component Value Date   ALBUMIN 2.4 (L) 01/23/2018   ALBUMIN 3.3 (L) 01/21/2018   PREALBUMIN 6.2 (L) 01/23/2018    Body mass index is 26.65 kg/m.  Orders:  No orders of the defined types were placed in this encounter.  No orders of the defined types were placed in this encounter.    Procedures: No procedures performed  Clinical Data: No additional findings.  ROS:  All other systems negative, except as noted in the HPI. Review of Systems  Objective: Vital Signs: Ht 6\' 1"  (1.854 m)   Wt 202 lb (91.6 kg)   BMI 26.65 kg/m   Specialty Comments:  No specialty comments available.  PMFS History: Patient Active Problem List   Diagnosis Date Noted  . S/P transmetatarsal amputation of foot, left ([place]) 01/29/2018  . Necrotizing fasciitis of ankle and foot ([place]) 01/22/2018  . DM2 (diabetes mellitus, type 2) ([place]) 01/22/2018  . Sepsis ([place]) 01/22/2018  . Diabetic ulcer of left foot ([place]) 01/22/2018  . Necrotizing soft tissue infection    Past Medical History:  Diagnosis Date  . Diabetes mellitus without complication (  [place])    type 2    History reviewed. No pertinent family history.  Past Surgical History:  Procedure Laterality Date  . AMPUTATION Left 01/22/2018   Procedure: TRANSMETATARSAL AMPUTATION;  Surgeon: Newt Minion, MD;  Location: [facility];  Service: Orthopedics;  Laterality: Left;toes  . COLONOSCOPY WITH PROPOFOL N/A 05/24/2018   Procedure: COLONOSCOPY WITH PROPOFOL;  Surgeon: Carol Ada, MD;  [facility];  Service: Endoscopy;  Laterality: N/A;  . POLYPECTOMY  05/24/2018   Procedure: POLYPECTOMY;  Surgeon: Carol Ada, MD;  [facility];  Service: Endoscopy;;   Social  History   Occupational History  . Not on file  Tobacco Use  . Smoking status: Never Smoker  . Smokeless tobacco: Never Used  Substance and Sexual Activity  . Alcohol use: Yes    Alcohol/week: 1.0 standard drinks    Types: 1 Cans of beer per week    Comment: occasional  . Drug use: Never  . Sexual activity: Yes

## 2018-06-24 ENCOUNTER — Encounter (INDEPENDENT_AMBULATORY_CARE_PROVIDER_SITE_OTHER): Payer: Self-pay | Admitting: Physician Assistant

## 2018-06-24 ENCOUNTER — Ambulatory Visit (INDEPENDENT_AMBULATORY_CARE_PROVIDER_SITE_OTHER): Payer: 59 | Admitting: Physician Assistant

## 2018-06-24 ENCOUNTER — Encounter (INDEPENDENT_AMBULATORY_CARE_PROVIDER_SITE_OTHER): Payer: Self-pay | Admitting: Orthopedic Surgery

## 2018-06-24 VITALS — Ht 73.0 in | Wt 202.0 lb

## 2018-06-24 DIAGNOSIS — E1169 Type 2 diabetes mellitus with other specified complication: Secondary | ICD-10-CM | POA: Diagnosis not present

## 2018-06-24 DIAGNOSIS — Z794 Long term (current) use of insulin: Secondary | ICD-10-CM | POA: Diagnosis not present

## 2018-06-24 DIAGNOSIS — Z89432 Acquired absence of left foot: Secondary | ICD-10-CM | POA: Diagnosis not present

## 2018-07-01 NOTE — Progress Notes (Signed)
Office Visit Note   Patient: Jeffrey Young           Date of Birth: Mar 19, 1966           MRN: [id number] Visit Date: 06/24/2018              Requested by: [name], MD [place] [place], [place] [id number] PCP: [name], MD  Chief Complaint  Patient presents with  . Left Foot - Follow-up    01/22/18 left foot transmet amputation       HPI: The patient is a 53 year old gentleman who is seen for postoperative follow-up following a left transmetatarsal amputation on 01/22/2018.  He has worked with [employer] for double upright bracing and inserts and shoes.  He is working 1/2 days light duty and we discussed that this would be a good idea to continue this for now.  Assessment & Plan: Visit Diagnoses:  1. S/P transmetatarsal amputation of foot, left ([place])   2. Type 2 diabetes mellitus with other specified complication, with long-term current use of insulin ([place])     Plan: Recommend continue to work max of 4 hours daily with no lifting more than 10 pounds through July 29, 2018.  He will follow-up in 4 weeks for recheck.  Follow-Up Instructions: No follow-ups on file.   Ortho Exam  Patient is alert, oriented, no adenopathy, well-dressed, normal affect, normal respiratory effort. Patient ambulates with a double upright brace and shoe and inserts over the left lower extremity.  The left transmetatarsal amputation site is well-healed with some minimal crusting over the distal incision line but no overt breakdown or callus formation.  He has a good dorsalis pedis pulse.  No signs of cellulitis.  Scant edema.  Imaging: No results found. No images are attached to the encounter.  Labs: Lab Results  Component Value Date   HGBA1C 7.2 (H) 01/23/2018   ESRSEDRATE 121 (H) 01/23/2018   CRP 21.4 (H) 01/23/2018   REPTSTATUS 01/31/2018 FINAL 01/26/2018   GRAMSTAIN  01/22/2018    RARE WBC PRESENT, PREDOMINANTLY PMN MODERATE GRAM POSITIVE COCCI MODERATE GRAM  NEGATIVE RODS FEW GRAM POSITIVE RODS Performed at [facility], [place] [address] [place], [place] [address]    CULT  01/26/2018    NO GROWTH 5 DAYS Performed at [facility] [address] [place], [place] [address]    LABORGA PROTEUS MIRABILIS 01/22/2018     Lab Results  Component Value Date   ALBUMIN 2.4 (L) 01/23/2018   ALBUMIN 3.3 (L) 01/21/2018   PREALBUMIN 6.2 (L) 01/23/2018    Body mass index is 26.65 kg/m.  Orders:  No orders of the defined types were placed in this encounter.  No orders of the defined types were placed in this encounter.    Procedures: No procedures performed  Clinical Data: No additional findings.  ROS:  All other systems negative, except as noted in the HPI. Review of Systems  Objective: Vital Signs: Ht 6\' 1"  (1.854 m)   Wt 202 lb (91.6 kg)   BMI 26.65 kg/m   Specialty Comments:  No specialty comments available.  PMFS History: Patient Active Problem List   Diagnosis Date Noted  . S/P transmetatarsal amputation of foot, left ([place]) 01/29/2018  . Necrotizing fasciitis of ankle and foot ([place]) 01/22/2018  . DM2 (diabetes mellitus, type 2) ([place]) 01/22/2018  . Sepsis ([place]) 01/22/2018  . Diabetic ulcer of left foot ([place]) 01/22/2018  . Necrotizing soft tissue infection  Past Medical History:  Diagnosis Date  . Diabetes mellitus without complication ([place])    type 2    No family history on file.  Past Surgical History:  Procedure Laterality Date  . AMPUTATION Left 01/22/2018   Procedure: TRANSMETATARSAL AMPUTATION;  Surgeon: Newt Minion, MD;  Location: [facility];  Service: Orthopedics;  Laterality: Left;toes  . COLONOSCOPY WITH PROPOFOL N/A 05/24/2018   Procedure: COLONOSCOPY WITH PROPOFOL;  Surgeon: Carol Ada, MD;  [facility];  Service: Endoscopy;  Laterality: N/A;  . POLYPECTOMY  05/24/2018   Procedure: POLYPECTOMY;  Surgeon: Carol Ada, MD;  [facility];  Service: Endoscopy;;   Social  History   Occupational History  . Not on file  Tobacco Use  . Smoking status: Never Smoker  . Smokeless tobacco: Never Used  Substance and Sexual Activity  . Alcohol use: Yes    Alcohol/week: 1.0 standard drinks    Types: 1 Cans of beer per week    Comment: occasional  . Drug use: Never  . Sexual activity: Yes

## 2018-07-22 ENCOUNTER — Ambulatory Visit (INDEPENDENT_AMBULATORY_CARE_PROVIDER_SITE_OTHER): Payer: 59 | Admitting: Orthopedic Surgery

## 2018-07-25 ENCOUNTER — Encounter (INDEPENDENT_AMBULATORY_CARE_PROVIDER_SITE_OTHER): Payer: Self-pay | Admitting: Orthopedic Surgery

## 2018-07-25 ENCOUNTER — Encounter (INDEPENDENT_AMBULATORY_CARE_PROVIDER_SITE_OTHER): Payer: Self-pay | Admitting: Physician Assistant

## 2018-07-25 ENCOUNTER — Ambulatory Visit (INDEPENDENT_AMBULATORY_CARE_PROVIDER_SITE_OTHER): Payer: 59 | Admitting: Physician Assistant

## 2018-07-25 VITALS — Ht 73.0 in | Wt 202.0 lb

## 2018-07-25 DIAGNOSIS — E440 Moderate protein-calorie malnutrition: Secondary | ICD-10-CM

## 2018-07-25 DIAGNOSIS — E1169 Type 2 diabetes mellitus with other specified complication: Secondary | ICD-10-CM

## 2018-07-25 DIAGNOSIS — Z89432 Acquired absence of left foot: Secondary | ICD-10-CM

## 2018-07-25 DIAGNOSIS — Z794 Long term (current) use of insulin: Secondary | ICD-10-CM

## 2018-07-25 NOTE — Progress Notes (Signed)
Office Visit Note   Patient: Jeffrey Young           Date of Birth: 08-20-1965           MRN: [id number] Visit Date: 07/25/2018              Requested by: [name], MD [place], [place] [id number] PCP: [name], MD  Chief Complaint  Patient presents with  . Left Foot - Follow-up    01/22/18 left foot transmet amputation       HPI: The patient is a 53 year old gentleman who is seen for postoperative follow-up following a left transmetatarsal amputation on 01/22/2018.  He worked with [employer] clinic and has double upright bracing and inserts and shoes for the left foot.  He has been working 4 hours daily with light duty restrictions and would like to increase his working hours.  He is pleased with how he is doing and has no other issues today.  Assessment & Plan: Visit Diagnoses:  1. S/P transmetatarsal amputation of foot, left ([place])   2. Type 2 diabetes mellitus with other specified complication, with long-term current use of insulin ([place])   3. Moderate protein malnutrition (HCC)     Plan: Continue left custom shoe with insert with double upright bracing.  Will resume working full 8-hour days but no lifting more than 10 pounds and rest break as needed every 2 hours if having any swelling of the left foot or any pain issues in the left foot.  He will follow-up here in 4 weeks for recheck following his return to full duty.  Follow-Up Instructions: Return in about 4 weeks (around 08/22/2018).   Ortho Exam  Patient is alert, oriented, no adenopathy, well-dressed, normal affect, normal respiratory effort. The left transmetatarsal amputation site is well-healed.  He ambulates with a double upright brace and shoe with insert.  There is no area of callus or evidence for skin breakdown or pressure areas.  No signs of cellulitis or infection.  He has a good dorsalis pedis pulse.  Imaging: No results found. No images are attached to the  encounter.  Labs: Lab Results  Component Value Date   HGBA1C 7.2 (H) 01/23/2018   ESRSEDRATE 121 (H) 01/23/2018   CRP 21.4 (H) 01/23/2018   REPTSTATUS 01/31/2018 FINAL 01/26/2018   GRAMSTAIN  01/22/2018    RARE WBC PRESENT, PREDOMINANTLY PMN MODERATE GRAM POSITIVE COCCI MODERATE GRAM NEGATIVE RODS FEW GRAM POSITIVE RODS Performed at [facility], [place] [address], [place], [place] [address]    CULT  01/26/2018    NO GROWTH 5 DAYS Performed at [facility] [address] [place], [place] [address]    LABORGA PROTEUS MIRABILIS 01/22/2018     Lab Results  Component Value Date   ALBUMIN 2.4 (L) 01/23/2018   ALBUMIN 3.3 (L) 01/21/2018   PREALBUMIN 6.2 (L) 01/23/2018    Body mass index is 26.65 kg/m.  Orders:  No orders of the defined types were placed in this encounter.  No orders of the defined types were placed in this encounter.    Procedures: No procedures performed  Clinical Data: No additional findings.  ROS:  All other systems negative, except as noted in the HPI. Review of Systems  Objective: Vital Signs: Ht 6\' 1"  (1.854 m)   Wt 202 lb (91.6 kg)   BMI 26.65 kg/m   Specialty Comments:  No specialty comments available.  PMFS History: Patient Active Problem List  Diagnosis Date Noted  . S/P transmetatarsal amputation of foot, left ([place]) 01/29/2018  . Necrotizing fasciitis of ankle and foot ([place]) 01/22/2018  . DM2 (diabetes mellitus, type 2) ([place]) 01/22/2018  . Sepsis ([place]) 01/22/2018  . Diabetic ulcer of left foot ([place]) 01/22/2018  . Necrotizing soft tissue infection    Past Medical History:  Diagnosis Date  . Diabetes mellitus without complication ([place])    type 2    History reviewed. No pertinent family history.  Past Surgical History:  Procedure Laterality Date  . AMPUTATION Left 01/22/2018   Procedure: TRANSMETATARSAL AMPUTATION;  Surgeon: Newt Minion, MD;  Location: [facility];  Service: Orthopedics;  Laterality: Left;toes   . COLONOSCOPY WITH PROPOFOL N/A 05/24/2018   Procedure: COLONOSCOPY WITH PROPOFOL;  Surgeon: Carol Ada, MD;  [facility];  Service: Endoscopy;  Laterality: N/A;  . POLYPECTOMY  05/24/2018   Procedure: POLYPECTOMY;  Surgeon: Carol Ada, MD;  [facility];  Service: Endoscopy;;   Social History   Occupational History  . Not on file  Tobacco Use  . Smoking status: Never Smoker  . Smokeless tobacco: Never Used  Substance and Sexual Activity  . Alcohol use: Yes    Alcohol/week: 1.0 standard drinks    Types: 1 Cans of beer per week    Comment: occasional  . Drug use: Never  . Sexual activity: Yes

## 2018-08-14 ENCOUNTER — Telehealth (INDEPENDENT_AMBULATORY_CARE_PROVIDER_SITE_OTHER): Payer: Self-pay

## 2018-08-14 NOTE — Telephone Encounter (Signed)
Patient was rescheduled and screened; all answers were No to all screening questions.

## 2018-08-22 ENCOUNTER — Other Ambulatory Visit: Payer: Self-pay

## 2018-08-22 ENCOUNTER — Encounter (INDEPENDENT_AMBULATORY_CARE_PROVIDER_SITE_OTHER): Payer: Self-pay | Admitting: Orthopedic Surgery

## 2018-08-22 ENCOUNTER — Ambulatory Visit (INDEPENDENT_AMBULATORY_CARE_PROVIDER_SITE_OTHER): Payer: 59 | Admitting: Orthopedic Surgery

## 2018-08-22 ENCOUNTER — Ambulatory Visit (INDEPENDENT_AMBULATORY_CARE_PROVIDER_SITE_OTHER): Payer: 59

## 2018-08-22 ENCOUNTER — Ambulatory Visit (INDEPENDENT_AMBULATORY_CARE_PROVIDER_SITE_OTHER): Payer: 59 | Admitting: Physician Assistant

## 2018-08-22 VITALS — Ht 73.0 in | Wt 202.0 lb

## 2018-08-22 DIAGNOSIS — G8929 Other chronic pain: Secondary | ICD-10-CM | POA: Diagnosis not present

## 2018-08-22 DIAGNOSIS — M247 Protrusio acetabuli: Secondary | ICD-10-CM | POA: Diagnosis not present

## 2018-08-22 DIAGNOSIS — M545 Low back pain, unspecified: Secondary | ICD-10-CM

## 2018-08-22 DIAGNOSIS — M160 Bilateral primary osteoarthritis of hip: Secondary | ICD-10-CM

## 2018-08-22 NOTE — Progress Notes (Signed)
Office Visit Note   Patient: Jeffrey Young           Date of Birth: [birth year]           MRN: [id number] Visit Date: 08/22/2018              Requested by: [name], MD [place], [place] [phone] PCP: [name], MD  Chief Complaint  Patient presents with  . Left Foot - Follow-up    01/22/18 left TMA      HPI: The patient comes in today for evaluation of lower back and right hip pain. The patient reports he was injured in 1986 while in the [employer] when an A frame tent fell on him. He reports he has had chronic back and right hip pain since this time. He states that the pain and decreased function have been getting worse over the years and much more marked over the past couple of years. He reports it is an dull aching type pain which is in the lower back, right hip area and sometimes in the right anterior thigh area. He reports some occasional popping over the hip and this makes the pain worse. He denies any bladder or bowel dysfunction.  He uses heating pads to the area for pain relief. He does not like to take medications for the pain. He has not had any physical therapy or other modalities for the pain and decreased function.  He reports he now notes difficulty with going up and down stairs and is no longer able to climb a ladder at work or home. He reports he cannot pick up something he drops on the floor at work as he is having significant difficulty standing back up after he bends down. He reports worse pain with prolonged standing and sitting. He reports he has to use his arms to help push up from a seated position. His wife now has to help him don and doff his socks and shoes. He is wearing a double upright brace and custom shoe with insert following his recent left transmetatarsal amputation which is well healed and causing him no concerns today.   Assessment & Plan: Visit Diagnoses:  1. Chronic right-sided low back pain without sciatica   2.  Osteoarthritis of both hips, unspecified osteoarthritis type   3. Acetabular protrusion     Plan: Counseled the patient that he has severe acetabular protrusion bilaterally which could be the result of trauma in the past. We discussed that he will likely need to undergo total hip arthroplasty at some point due to the degree of arthritic changes and decreased functional status .  He will follow up in about 6 weeks.   Follow-Up Instructions: Return in about 6 weeks (around 10/03/2018).   Right Hip Exam   Tenderness  The patient is experiencing tenderness in the anterior, greater trochanter and lateral.  Range of Motion  Abduction: abnormal  Adduction: normal  Extension: normal  Flexion: abnormal  External rotation: abnormal  Internal rotation: abnormal   Muscle Strength  The patient has normal right hip strength.  Other  Erythema: absent Scars: absent Sensation: normal Pulse: present   Left Hip Exam   Tenderness  The patient is experiencing tenderness in the anterior.  Range of Motion  Abduction: abnormal  Adduction: normal  Extension: normal  Flexion: normal  External rotation: abnormal  Internal rotation: abnormal   Muscle Strength  The patient has normal left hip strength.   Other  Erythema: absent Scars: absent Pulse: present      Patient is alert, oriented, no adenopathy, well-dressed, normal affect, normal respiratory effort. The left TMA is well healed and the patient ambulates with double upright bracing on the left leg.  He has an antalgic appearing gait.   Imaging: No results found. No images are attached to the encounter.  Labs: Lab Results  Component Value Date   HGBA1C 7.2 (H) 01/23/2018   ESRSEDRATE 121 (H) 01/23/2018   CRP 21.4 (H) 01/23/2018   REPTSTATUS 01/31/2018 FINAL 01/26/2018   GRAMSTAIN  01/22/2018    RARE WBC PRESENT, PREDOMINANTLY PMN MODERATE GRAM POSITIVE COCCI MODERATE GRAM NEGATIVE RODS FEW GRAM POSITIVE RODS  Performed at [facility], [place] [address] [place], [place] [address]    CULT  01/26/2018    NO GROWTH 5 DAYS Performed at [facility] [address] [place], [place] [address]    LABORGA PROTEUS MIRABILIS 01/22/2018     Lab Results  Component Value Date   ALBUMIN 2.4 (L) 01/23/2018   ALBUMIN 3.3 (L) 01/21/2018   PREALBUMIN 6.2 (L) 01/23/2018    Body mass index is 26.65 kg/m.  Orders:  Orders Placed This Encounter  Procedures  . XR Lumbar Spine 2-3 Views  . XR HIP UNILAT W OR W/O PELVIS 2-3 VIEWS RIGHT   No orders of the defined types were placed in this encounter.    Procedures: No procedures performed  Clinical Data: No additional findings.  ROS:  All other systems negative, except as noted in the HPI. Review of Systems  Objective: Vital Signs: Ht 6\' 1"  (1.854 m)   Wt 202 lb (91.6 kg)   BMI 26.65 kg/m   Specialty Comments:  No specialty comments available.  PMFS History: Patient Active Problem List   Diagnosis Date Noted  . S/P transmetatarsal amputation of foot, left ([place]) 01/29/2018  . Necrotizing fasciitis of ankle and foot ([place]) 01/22/2018  . DM2 (diabetes mellitus, type 2) ([place]) 01/22/2018  . Sepsis ([place]) 01/22/2018  . Diabetic ulcer of left foot ([place]) 01/22/2018  . Necrotizing soft tissue infection    Past Medical History:  Diagnosis Date  . Diabetes mellitus without complication ([place])    type 2    History reviewed. No pertinent family history.  Past Surgical History:  Procedure Laterality Date  . AMPUTATION Left 01/22/2018   Procedure: TRANSMETATARSAL AMPUTATION;  Surgeon: Newt Minion, MD;  Location: [facility];  Service: Orthopedics;  Laterality: Left;toes  . COLONOSCOPY WITH PROPOFOL N/A 05/24/2018   Procedure: COLONOSCOPY WITH PROPOFOL;  Surgeon: Carol Ada, MD;  [facility];  Service: Endoscopy;  Laterality: N/A;  . POLYPECTOMY  05/24/2018   Procedure: POLYPECTOMY;  Surgeon: Carol Ada, MD;  [facility];  Service: Endoscopy;;   Social History   Occupational History  . Not on file  Tobacco Use  . Smoking status: Never Smoker  . Smokeless tobacco: Never Used  Substance and Sexual Activity  . Alcohol use: Yes    Alcohol/week: 1.0 standard drinks    Types: 1 Cans of beer per week    Comment: occasional  . Drug use: Never  . Sexual activity: Yes

## 2018-08-27 ENCOUNTER — Encounter (INDEPENDENT_AMBULATORY_CARE_PROVIDER_SITE_OTHER): Payer: Self-pay | Admitting: Physician Assistant

## 2018-09-19 ENCOUNTER — Ambulatory Visit (INDEPENDENT_AMBULATORY_CARE_PROVIDER_SITE_OTHER): Payer: 59 | Admitting: Orthopedic Surgery

## 2018-09-19 ENCOUNTER — Other Ambulatory Visit: Payer: Self-pay

## 2018-09-19 ENCOUNTER — Encounter (INDEPENDENT_AMBULATORY_CARE_PROVIDER_SITE_OTHER): Payer: Self-pay | Admitting: Orthopedic Surgery

## 2018-09-19 VITALS — Ht 73.0 in | Wt 202.0 lb

## 2018-09-19 DIAGNOSIS — M160 Bilateral primary osteoarthritis of hip: Secondary | ICD-10-CM

## 2018-09-19 DIAGNOSIS — E1169 Type 2 diabetes mellitus with other specified complication: Secondary | ICD-10-CM | POA: Diagnosis not present

## 2018-09-19 DIAGNOSIS — M247 Protrusio acetabuli: Secondary | ICD-10-CM | POA: Diagnosis not present

## 2018-09-19 DIAGNOSIS — Z89432 Acquired absence of left foot: Secondary | ICD-10-CM

## 2018-09-19 DIAGNOSIS — E440 Moderate protein-calorie malnutrition: Secondary | ICD-10-CM

## 2018-09-19 DIAGNOSIS — Z794 Long term (current) use of insulin: Secondary | ICD-10-CM

## 2018-09-19 NOTE — Progress Notes (Signed)
Office Visit Note   Patient: Jeffrey Young           Date of Birth: [birth year]           MRN: [id number] Visit Date: 09/19/2018              Requested by: [name], MD [place], [place] [id number] PCP: [name], MD  Chief Complaint  Patient presents with  . Left Foot - Follow-up    01/22/18 left foot TMA  . Right Hip - Follow-up, Pain      HPI: Patient is a 53 year old gentleman who presents for 2 separate issues.  #1 he is status post left transmetatarsal amputation he is currently wearing a brace and shoe with orthotics and he states that his left foot is doing quite well.  #2 patient states he has been having increasing pain in the right hip he has groin pain lateral and posterior pain globally around the right hip.  He states he has difficulty going up and down stairs he states this is been a chronic problem and is slowly getting worse.  Assessment & Plan: Visit Diagnoses:  1. Acetabular protrusion   2. Osteoarthritis of both hips, unspecified osteoarthritis type   3. S/P transmetatarsal amputation of foot, left ([place])   4. Type 2 diabetes mellitus with other specified complication, with long-term current use of insulin ([place])   5. Moderate protein malnutrition ([place])     Plan: Discussed with the patient we can proceed with conservative options with heat anti-inflammatories and CBD oil.  Discussed the risk of long-term use of anti-inflammatories.  Discussed the next step would be to proceed with a steroid injection of the right hip and the final option is a right total hip arthroplasty.  Patient states he understands he would like to proceed with the most conservative options first and will follow-up as needed.  Follow-Up Instructions: Return if symptoms worsen or fail to improve.   Ortho Exam  Patient is alert, oriented, no adenopathy, well-dressed, normal affect, normal respiratory effort. Examination patient has difficulty getting from  a sitting to a standing position from the chair.  He does have an abductor lurch on the right.  He has a negative straight leg raise on the right with no focal motor weakness he has internal rotation of 30 degrees external rotation of about 0 degrees.  Imaging: No results found. No images are attached to the encounter.  Labs: Lab Results  Component Value Date   HGBA1C 7.2 (H) 01/23/2018   ESRSEDRATE 121 (H) 01/23/2018   CRP 21.4 (H) 01/23/2018   REPTSTATUS 01/31/2018 FINAL 01/26/2018   GRAMSTAIN  01/22/2018    RARE WBC PRESENT, PREDOMINANTLY PMN MODERATE GRAM POSITIVE COCCI MODERATE GRAM NEGATIVE RODS FEW GRAM POSITIVE RODS Performed at [facility], [place] [address] [place], [place] [address]    CULT  01/26/2018    NO GROWTH 5 DAYS Performed at [facility] [address] [place], [place] [address]    LABORGA PROTEUS MIRABILIS 01/22/2018     Lab Results  Component Value Date   ALBUMIN 2.4 (L) 01/23/2018   ALBUMIN 3.3 (L) 01/21/2018   PREALBUMIN 6.2 (L) 01/23/2018    Body mass index is 26.65 kg/m.  Orders:  No orders of the defined types were placed in this encounter.  No orders of the defined types were placed in this encounter.    Procedures: No procedures performed  Clinical Data: No additional  findings.  ROS:  All other systems negative, except as noted in the HPI. Review of Systems  Objective: Vital Signs: Ht 6\' 1"  (1.854 m)   Wt 202 lb (91.6 kg)   BMI 26.65 kg/m   Specialty Comments:  No specialty comments available.  PMFS History: Patient Active Problem List   Diagnosis Date Noted  . S/P transmetatarsal amputation of foot, left ([place]) 01/29/2018  . Necrotizing fasciitis of ankle and foot ([place]) 01/22/2018  . DM2 (diabetes mellitus, type 2) ([place]) 01/22/2018  . Sepsis ([place]) 01/22/2018  . Diabetic ulcer of left foot ([place]) 01/22/2018  . Necrotizing soft tissue infection    Past Medical History:  Diagnosis Date  . Diabetes  mellitus without complication ([place])    type 2    History reviewed. No pertinent family history.  Past Surgical History:  Procedure Laterality Date  . AMPUTATION Left 01/22/2018   Procedure: TRANSMETATARSAL AMPUTATION;  Surgeon: Newt Minion, MD;  Location: [facility];  Service: Orthopedics;  Laterality: Left;toes  . COLONOSCOPY WITH PROPOFOL N/A 05/24/2018   Procedure: COLONOSCOPY WITH PROPOFOL;  Surgeon: Carol Ada, MD;  Location: [facility];  Service: Endoscopy;  Laterality: N/A;  . POLYPECTOMY  05/24/2018   Procedure: POLYPECTOMY;  Surgeon: Carol Ada, MD;  Location: [facility];  Service: Endoscopy;;   Social History   Occupational History  . Not on file  Tobacco Use  . Smoking status: Never Smoker  . Smokeless tobacco: Never Used  Substance and Sexual Activity  . Alcohol use: Yes    Alcohol/week: 1.0 standard drinks    Types: 1 Cans of beer per week    Comment: occasional  . Drug use: Never  . Sexual activity: Yes

## 2018-12-23 ENCOUNTER — Telehealth: Payer: Self-pay | Admitting: Orthopedic Surgery

## 2018-12-23 NOTE — Telephone Encounter (Signed)
Patient left a message stating that he was returning your call.  CB#[phone].  Thank you.

## 2018-12-23 NOTE — Telephone Encounter (Signed)
I dont see order for MRI, was there supposed to be one?

## 2018-12-23 NOTE — Telephone Encounter (Signed)
I called and lm on vm for the pt to advise that I did not see a referral for an MRI nor was it mentioned in his office visit notes. To call and let me know waht the MRI was supposed to be of. Do see that he was supposed to discuss possible hip injection and that patient may be in need of a total hip replacement.

## 2018-12-23 NOTE — Telephone Encounter (Signed)
Patient called stated that he was supposed to have MRI and then he was told that GI was closed and wants to know if he can move forward with that MRI now.  Please call and advise patient.  [phone]

## 2018-12-23 NOTE — Telephone Encounter (Signed)
Please see below. Can you follow up on this? Let me know if there is anything you need me to do.

## 2018-12-25 NOTE — Telephone Encounter (Signed)
Patient was called and he stated that Dr Sharol Given and Raquel Sarna suggested that he have a MRI of Lumbar and Bil Hips done. Junie Panning suggested that patient could come in and get a injection at this time because that is what is documented in his chart. Patient was notified of this as well and stated he will call us back after he talks to his insurance to make sure they will pay for this procedure. I informed patient that I would inform Dr Sharol Given of this when he comes back to office tomorrow and we will follow-up with him.

## 2019-05-08 DIAGNOSIS — E1169 Type 2 diabetes mellitus with other specified complication: Secondary | ICD-10-CM

## 2019-05-08 HISTORY — DX: Type 2 diabetes mellitus with other specified complication: E11.69

## 2019-06-07 DIAGNOSIS — I152 Hypertension secondary to endocrine disorders: Secondary | ICD-10-CM

## 2019-06-07 DIAGNOSIS — E1159 Type 2 diabetes mellitus with other circulatory complications: Secondary | ICD-10-CM

## 2019-06-07 HISTORY — DX: Type 2 diabetes mellitus with other circulatory complications: E11.59

## 2019-06-07 HISTORY — DX: Hypertension secondary to endocrine disorders: I15.2

## 2020-07-24 ENCOUNTER — Encounter (HOSPITAL_COMMUNITY): Payer: Self-pay

## 2020-07-24 ENCOUNTER — Other Ambulatory Visit: Payer: Self-pay

## 2020-07-24 ENCOUNTER — Inpatient Hospital Stay (HOSPITAL_COMMUNITY)
Admission: EM | Admit: 2020-07-24 | Discharge: 2020-08-03 | DRG: 329 | Disposition: A | Discharge status: Home / Self Care | Payer: 59 | Attending: Internal Medicine | Admitting: Internal Medicine

## 2020-07-24 ENCOUNTER — Emergency Department (HOSPITAL_COMMUNITY): Payer: 59

## 2020-07-24 DIAGNOSIS — N183 Chronic kidney disease, stage 3 unspecified: Secondary | ICD-10-CM

## 2020-07-24 DIAGNOSIS — E11649 Type 2 diabetes mellitus with hypoglycemia without coma: Secondary | ICD-10-CM | POA: Diagnosis present

## 2020-07-24 DIAGNOSIS — K660 Peritoneal adhesions (postprocedural) (postinfection): Secondary | ICD-10-CM | POA: Diagnosis present

## 2020-07-24 DIAGNOSIS — D649 Anemia, unspecified: Secondary | ICD-10-CM | POA: Diagnosis present

## 2020-07-24 DIAGNOSIS — R195 Other fecal abnormalities: Secondary | ICD-10-CM | POA: Diagnosis present

## 2020-07-24 DIAGNOSIS — E860 Dehydration: Secondary | ICD-10-CM | POA: Diagnosis present

## 2020-07-24 DIAGNOSIS — I5031 Acute diastolic (congestive) heart failure: Secondary | ICD-10-CM | POA: Diagnosis present

## 2020-07-24 DIAGNOSIS — Z86010 Personal history of colonic polyps: Secondary | ICD-10-CM | POA: Diagnosis not present

## 2020-07-24 DIAGNOSIS — E1169 Type 2 diabetes mellitus with other specified complication: Secondary | ICD-10-CM

## 2020-07-24 DIAGNOSIS — Z91030 Bee allergy status: Secondary | ICD-10-CM

## 2020-07-24 DIAGNOSIS — I152 Hypertension secondary to endocrine disorders: Secondary | ICD-10-CM | POA: Diagnosis not present

## 2020-07-24 DIAGNOSIS — Z20822 Contact with and (suspected) exposure to covid-19: Secondary | ICD-10-CM | POA: Diagnosis present

## 2020-07-24 DIAGNOSIS — E1141 Type 2 diabetes mellitus with diabetic mononeuropathy: Secondary | ICD-10-CM | POA: Diagnosis present

## 2020-07-24 DIAGNOSIS — D509 Iron deficiency anemia, unspecified: Secondary | ICD-10-CM

## 2020-07-24 DIAGNOSIS — I129 Hypertensive chronic kidney disease with stage 1 through stage 4 chronic kidney disease, or unspecified chronic kidney disease: Secondary | ICD-10-CM | POA: Diagnosis present

## 2020-07-24 DIAGNOSIS — K922 Gastrointestinal hemorrhage, unspecified: Secondary | ICD-10-CM | POA: Diagnosis present

## 2020-07-24 DIAGNOSIS — E1159 Type 2 diabetes mellitus with other circulatory complications: Secondary | ICD-10-CM

## 2020-07-24 DIAGNOSIS — N179 Acute kidney failure, unspecified: Secondary | ICD-10-CM

## 2020-07-24 DIAGNOSIS — E861 Hypovolemia: Secondary | ICD-10-CM | POA: Diagnosis present

## 2020-07-24 DIAGNOSIS — Z91040 Latex allergy status: Secondary | ICD-10-CM | POA: Diagnosis not present

## 2020-07-24 DIAGNOSIS — E871 Hypo-osmolality and hyponatremia: Secondary | ICD-10-CM | POA: Diagnosis present

## 2020-07-24 DIAGNOSIS — I509 Heart failure, unspecified: Secondary | ICD-10-CM

## 2020-07-24 DIAGNOSIS — E119 Type 2 diabetes mellitus without complications: Secondary | ICD-10-CM

## 2020-07-24 DIAGNOSIS — Z794 Long term (current) use of insulin: Secondary | ICD-10-CM

## 2020-07-24 DIAGNOSIS — R911 Solitary pulmonary nodule: Secondary | ICD-10-CM | POA: Diagnosis present

## 2020-07-24 DIAGNOSIS — Z8249 Family history of ischemic heart disease and other diseases of the circulatory system: Secondary | ICD-10-CM

## 2020-07-24 DIAGNOSIS — I5020 Unspecified systolic (congestive) heart failure: Secondary | ICD-10-CM | POA: Diagnosis present

## 2020-07-24 DIAGNOSIS — K6389 Other specified diseases of intestine: Secondary | ICD-10-CM | POA: Diagnosis not present

## 2020-07-24 DIAGNOSIS — Z89422 Acquired absence of other left toe(s): Secondary | ICD-10-CM | POA: Diagnosis not present

## 2020-07-24 DIAGNOSIS — C186 Malignant neoplasm of descending colon: Principal | ICD-10-CM | POA: Diagnosis present

## 2020-07-24 DIAGNOSIS — R159 Full incontinence of feces: Secondary | ICD-10-CM | POA: Diagnosis not present

## 2020-07-24 DIAGNOSIS — E1122 Type 2 diabetes mellitus with diabetic chronic kidney disease: Secondary | ICD-10-CM | POA: Diagnosis present

## 2020-07-24 DIAGNOSIS — C185 Malignant neoplasm of splenic flexure: Secondary | ICD-10-CM

## 2020-07-24 DIAGNOSIS — I10 Essential (primary) hypertension: Secondary | ICD-10-CM

## 2020-07-24 DIAGNOSIS — K429 Umbilical hernia without obstruction or gangrene: Secondary | ICD-10-CM | POA: Diagnosis present

## 2020-07-24 DIAGNOSIS — I5033 Acute on chronic diastolic (congestive) heart failure: Secondary | ICD-10-CM | POA: Diagnosis not present

## 2020-07-24 DIAGNOSIS — Z79899 Other long term (current) drug therapy: Secondary | ICD-10-CM

## 2020-07-24 DIAGNOSIS — D500 Iron deficiency anemia secondary to blood loss (chronic): Secondary | ICD-10-CM | POA: Diagnosis not present

## 2020-07-24 HISTORY — DX: Other specified soft tissue disorders: M79.89

## 2020-07-24 LAB — CBC WITH DIFFERENTIAL/PLATELET
Abs Immature Granulocytes: 0.01 10*3/uL (ref 0.00–0.07)
Basophils Absolute: 0 10*3/uL (ref 0.0–0.1)
Basophils Relative: 1 %
Eosinophils Absolute: 0.1 10*3/uL (ref 0.0–0.5)
Eosinophils Relative: 1 %
HCT: 20.3 % — ABNORMAL LOW (ref 39.0–52.0)
Hemoglobin: 6 g/dL — CL (ref 13.0–17.0)
Immature Granulocytes: 0 %
Lymphocytes Relative: 21 %
Lymphs Abs: 1.5 10*3/uL (ref 0.7–4.0)
MCH: 25.5 pg — ABNORMAL LOW (ref 26.0–34.0)
MCHC: 29.6 g/dL — ABNORMAL LOW (ref 30.0–36.0)
MCV: 86.4 fL (ref 80.0–100.0)
Monocytes Absolute: 0.8 10*3/uL (ref 0.1–1.0)
Monocytes Relative: 11 %
Neutro Abs: 4.7 10*3/uL (ref 1.7–7.7)
Neutrophils Relative %: 66 %
Platelets: 281 10*3/uL (ref 150–400)
RBC: 2.35 MIL/uL — ABNORMAL LOW (ref 4.22–5.81)
RDW: 15.8 % — ABNORMAL HIGH (ref 11.5–15.5)
WBC: 7.2 10*3/uL (ref 4.0–10.5)
nRBC: 0 % (ref 0.0–0.2)

## 2020-07-24 LAB — COMPREHENSIVE METABOLIC PANEL
ALT: 19 U/L (ref 0–44)
AST: 26 U/L (ref 15–41)
Albumin: 3.3 g/dL — ABNORMAL LOW (ref 3.5–5.0)
Alkaline Phosphatase: 118 U/L (ref 38–126)
Anion gap: 9 (ref 5–15)
BUN: 32 mg/dL — ABNORMAL HIGH (ref 6–20)
CO2: 20 mmol/L — ABNORMAL LOW (ref 22–32)
Calcium: 8.5 mg/dL — ABNORMAL LOW (ref 8.9–10.3)
Chloride: 107 mmol/L (ref 98–111)
Creatinine, Ser: 1.64 mg/dL — ABNORMAL HIGH (ref 0.61–1.24)
GFR, Estimated: 48 mL/min — ABNORMAL LOW (ref >60)
Glucose, Bld: 208 mg/dL — ABNORMAL HIGH (ref 70–99)
Potassium: 4.3 mmol/L (ref 3.5–5.1)
Sodium: 136 mmol/L (ref 135–145)
Total Bilirubin: 0.8 mg/dL (ref 0.3–1.2)
Total Protein: 6.8 g/dL (ref 6.5–8.1)

## 2020-07-24 LAB — BRAIN NATRIURETIC PEPTIDE: B Natriuretic Peptide: 210.2 pg/mL — ABNORMAL HIGH (ref 0.0–100.0)

## 2020-07-24 LAB — POC SARS CORONAVIRUS 2 AG -  ED: SARS Coronavirus 2 Ag: NEGATIVE

## 2020-07-24 LAB — POC OCCULT BLOOD, ED: Fecal Occult Bld: POSITIVE — AB

## 2020-07-24 LAB — CBG MONITORING, ED: Glucose-Capillary: 282 mg/dL — ABNORMAL HIGH (ref 70–99)

## 2020-07-24 MED ORDER — SUCRALFATE 1 GM/10ML PO SUSP
1.0000 g | Freq: Once | ORAL | Status: AC
  Administered 2020-07-24: 1 g via ORAL
  Filled 2020-07-24: qty 10

## 2020-07-24 MED ORDER — ACETAMINOPHEN 325 MG PO TABS
650.0000 mg | ORAL_TABLET | ORAL | Status: DC | PRN
  Administered 2020-07-26 – 2020-07-31 (×3): 650 mg via ORAL
  Filled 2020-07-24 (×4): qty 2

## 2020-07-24 MED ORDER — ONDANSETRON HCL 4 MG/2ML IJ SOLN
4.0000 mg | Freq: Four times a day (QID) | INTRAMUSCULAR | Status: DC | PRN
  Administered 2020-07-30 – 2020-07-31 (×2): 4 mg via INTRAVENOUS
  Filled 2020-07-24: qty 2

## 2020-07-24 MED ORDER — FUROSEMIDE 10 MG/ML IJ SOLN
60.0000 mg | Freq: Once | INTRAMUSCULAR | Status: AC
  Administered 2020-07-25: 60 mg via INTRAVENOUS
  Filled 2020-07-24: qty 8

## 2020-07-24 MED ORDER — SODIUM CHLORIDE 0.9 % IV SOLN: 250.0000 mL | INTRAVENOUS | Status: DC | PRN

## 2020-07-24 MED ORDER — INSULIN ASPART 100 UNIT/ML ~~LOC~~ SOLN
0.0000 [IU] | SUBCUTANEOUS | Status: DC
  Administered 2020-07-25 (×2): 2 [IU] via SUBCUTANEOUS
  Administered 2020-07-25: 1 [IU] via SUBCUTANEOUS
  Administered 2020-07-25: 2 [IU] via SUBCUTANEOUS
  Administered 2020-07-25 – 2020-07-26 (×2): 1 [IU] via SUBCUTANEOUS
  Administered 2020-07-26 (×2): 2 [IU] via SUBCUTANEOUS
  Administered 2020-07-26 – 2020-07-27 (×2): 1 [IU] via SUBCUTANEOUS
  Administered 2020-07-27 (×2): 2 [IU] via SUBCUTANEOUS
  Administered 2020-07-28: 1 [IU] via SUBCUTANEOUS
  Administered 2020-07-28 (×4): 2 [IU] via SUBCUTANEOUS
  Administered 2020-07-29: 5 [IU] via SUBCUTANEOUS
  Administered 2020-07-29: 2 [IU] via SUBCUTANEOUS
  Administered 2020-07-29: 1 [IU] via SUBCUTANEOUS
  Administered 2020-07-29: 2 [IU] via SUBCUTANEOUS
  Administered 2020-07-29: 5 [IU] via SUBCUTANEOUS
  Filled 2020-07-24: qty 0.09

## 2020-07-24 MED ORDER — SODIUM CHLORIDE 0.9 % IV SOLN
10.0000 mL/h | Freq: Once | INTRAVENOUS | Status: AC
  Administered 2020-07-25: 10 mL/h via INTRAVENOUS

## 2020-07-24 MED ORDER — SODIUM CHLORIDE 0.9% FLUSH: 3.0000 mL | INTRAVENOUS | Status: DC | PRN

## 2020-07-24 MED ORDER — SODIUM CHLORIDE 0.9% FLUSH
3.0000 mL | Freq: Two times a day (BID) | INTRAVENOUS | Status: DC
  Administered 2020-07-25 – 2020-07-28 (×8): 3 mL via INTRAVENOUS

## 2020-07-24 NOTE — ED Provider Notes (Signed)
[facility] DEPT Provider Note   CSN: [id number] Arrival date & time: 07/24/20  1730     History Chief Complaint  Patient presents with  . Hypoglycemia  . Shortness of Breath    Jeffrey Young is a 55 y.o. male.  HPI Presents with complaints in 2 distinct rales. He has history of insulin-dependent diabetes, no known history of congestive heart failure.  He notes that earlier today, he had episode of lightheadedness, check his blood glucose, found to be 34.  After eating a cheeseburger, drinking something, it was elevated.  In the interim, patient notes that he had 1 episode of exertional dyspnea.  This is a new phenomenon, occurring over the past 5 days, though not with every mild episode of exertion. There is no exertional chest pain, no chest pain at all.  No fever, no syncope.  Patient states that he takes his medication as directed including insulin and statin. He is here with his male companion who assists with the HPI.  He notes that in addition to these concerns he has had recent abdominal girth enhancement, left lower extremity swelling.  She notes that the patient also has a new cough.  Past Medical History:  Diagnosis Date  . Diabetes mellitus without complication ([place])    type 2    Patient Active Problem List   Diagnosis Date Noted  . New onset of congestive heart failure ([place]) 07/24/2020  . Anemia 07/24/2020  . GIB (gastrointestinal bleeding) 07/24/2020  . S/P transmetatarsal amputation of foot, left ([place]) 01/29/2018  . Necrotizing fasciitis of ankle and foot ([place]) 01/22/2018  . DM2 (diabetes mellitus, type 2) ([place]) 01/22/2018  . Sepsis ([place]) 01/22/2018  . Diabetic ulcer of left foot ([place]) 01/22/2018  . Necrotizing soft tissue infection     Past Surgical History:  Procedure Laterality Date  . AMPUTATION Left 01/22/2018   Procedure: TRANSMETATARSAL AMPUTATION;  Surgeon: Newt Minion, MD;  Location: [facility];  Service: Orthopedics;   Laterality: Left;toes  . COLONOSCOPY WITH PROPOFOL N/A 05/24/2018   Procedure: COLONOSCOPY WITH PROPOFOL;  Surgeon: Carol Ada, MD;  [facility];  Service: Endoscopy;  Laterality: N/A;  . POLYPECTOMY  05/24/2018   Procedure: POLYPECTOMY;  Surgeon: Carol Ada, MD;  [facility];  Service: Endoscopy;;       Family History  Problem Relation Age of [place]  . Hypertension Father     Social History   Tobacco Use  . Smoking status: Never Smoker  . Smokeless tobacco: Never Used  Vaping Use  . Vaping Use: Never used  Substance Use Topics  . Alcohol use: Yes    Alcohol/week: 1.0 standard drink    Types: 1 Cans of beer per week    Comment: occasional  . Drug use: Never    Home Medications Prior to Admission medications   Medication Sig Start Date End Date Taking? Authorizing Provider  cetirizine (ZYRTEC) 10 MG tablet Take 10 mg by mouth daily as needed for allergies.    [provider]  Insulin Glargine (LANTUS SOLOSTAR) 100 UNIT/ML Solostar Pen Inject 20 Units into the skin at bedtime.    [provider]  tobramycin (TOBREX) 0.3 % ophthalmic solution Place 1 drop into both eyes See admin instructions. Use as needed following eye injection for dry, grainy, irritated eyes.    [provider]    Allergies    Bee venom and Latex  Review of Systems   Review of Systems  Constitutional:  Per HPI, otherwise negative  HENT:       Per HPI, otherwise negative  Respiratory:       Per HPI, otherwise negative  Cardiovascular:       Per HPI, otherwise negative  Gastrointestinal: Negative for vomiting.  Endocrine:       Negative aside from HPI  Genitourinary:       Neg aside from HPI   Musculoskeletal:       Per HPI, otherwise negative  Skin: Negative.   Neurological: Negative for syncope.    Physical Exam Updated Vital Signs BP (!) 169/88 (BP Location: Right Arm)   Pulse 83   Temp 98.8 F (37.1 C) (Oral)   Resp 20   Ht  6\' 1"  (1.854 m)   Wt 99.8 kg   SpO2 96%   BMI 29.03 kg/m   Physical Exam Vitals and nursing note reviewed.  Constitutional:      General: He is not in acute distress.    Appearance: He is well-developed.  HENT:     Head: Normocephalic and atraumatic.  Eyes:     Extraocular Movements: EOM normal.     Conjunctiva/sclera: Conjunctivae normal.  Cardiovascular:     Rate and Rhythm: Normal rate and regular rhythm.  Pulmonary:     Effort: Pulmonary effort is normal. No respiratory distress.     Breath sounds: No stridor.  Abdominal:     General: There is no distension.  Musculoskeletal:     Left lower leg: Edema present.  Skin:    General: Skin is warm and dry.  Neurological:     Mental Status: He is alert and oriented to person, place, and time.  Psychiatric:        Mood and Affect: Mood and affect normal.      ED Results / Procedures / Treatments   Labs (all labs ordered are listed, but only abnormal results are displayed) Labs Reviewed  COMPREHENSIVE METABOLIC PANEL - Abnormal; Notable for the following components:      Result Value   CO2 20 (*)    Glucose, Bld 208 (*)    BUN 32 (*)    Creatinine, Ser 1.64 (*)    Calcium 8.5 (*)    Albumin 3.3 (*)    GFR, Estimated 48 (*)    All other components within normal limits  BRAIN NATRIURETIC PEPTIDE - Abnormal; Notable for the following components:   B Natriuretic Peptide 210.2 (*)    All other components within normal limits  CBC WITH DIFFERENTIAL/PLATELET - Abnormal; Notable for the following components:   RBC 2.35 (*)    Hemoglobin 6.0 (*)    HCT 20.3 (*)    MCH 25.5 (*)    MCHC 29.6 (*)    RDW 15.8 (*)    All other components within normal limits  CBG MONITORING, ED - Abnormal; Notable for the following components:   Glucose-Capillary 282 (*)    All other components within normal limits  POC OCCULT BLOOD, ED - Abnormal; Notable for the following components:   Fecal Occult Bld POSITIVE (*)    All other  components within normal limits  HIV ANTIBODY (ROUTINE TESTING W REFLEX)  BASIC METABOLIC PANEL  CBC  HEMOGLOBIN A1C  POC SARS CORONAVIRUS 2 AG -  ED  TYPE AND SCREEN  PREPARE RBC (CROSSMATCH)    EKG Sinus rhythm, rate 91, anterior Q waves, abnormal EKG Pulse oximetry 98% room air normal  Cardiac monitor, rate 80s, sinus, unremarkable  Radiology DG  Chest 2 View  Result Date: 07/24/2020 CLINICAL DATA:  Shortness of breath, lower extremity swelling, hyperglycemia EXAM: CHEST - 2 VIEW COMPARISON:  01/21/2018 FINDINGS: Diffuse hazy interstitial opacities throughout the lungs with peripheral septal and fissural thickening. Bilateral pleural effusions. No pneumothorax. Congested pulmonary vascularity. Cardiac silhouette appears enlarged from comparison radiograph accounting for differences in technique. Remaining cardiomediastinal contours are unremarkable. No acute osseous or soft tissue abnormality. IMPRESSION: Findings consistent with CHF including cardiomegaly, vascular congestion, interstitial edema and bilateral pleural effusions. Electronically Signed   By: Lovena Le M.D.   On: 07/24/2020 19:11    Procedures Procedures   Medications Ordered in ED Medications  0.9 %  sodium chloride infusion (has no administration in time range)  sodium chloride flush (NS) 0.9 % injection 3 mL (has no administration in time range)  sodium chloride flush (NS) 0.9 % injection 3 mL (has no administration in time range)  0.9 %  sodium chloride infusion (has no administration in time range)  acetaminophen (TYLENOL) tablet 650 mg (has no administration in time range)  ondansetron (ZOFRAN) injection 4 mg (has no administration in time range)  furosemide (LASIX) injection 60 mg (has no administration in time range)  insulin aspart (novoLOG) injection 0-9 Units (has no administration in time range)  sucralfate (CARAFATE) 1 GM/10ML suspension 1 g (1 g Oral Given 07/24/20 2154)    ED Course  I have  reviewed the triage vital signs and the nursing notes.  Pertinent labs & imaging results that were available during my care of the patient were reviewed by me and considered in my medical decision making (see chart for details).    MDM Rules/Calculators/A&P                          11:50 PM On repeat exam the patient is calm, in no distress. Hemoccult positive status considered, patient is started type and screen, will initiate transfusion, he was agreeable to this. I have messaged our on-call GI team, [name], the patient has seen a different colleague, Dr. Benson Norway in the past.   Adult male presents with new dyspnea with exertion, and after episode of hypoglycemia, that resolved prior to ED arrival. Patient is awake and alert, but is found to have critically abnormal hemoglobin value, 6, Hemoccult positive stool, there are some suspicion evidence of CHF being secondary to high flow heart failure, the patient has no history of this, nor prior echocardiogram. Here patient was amenable to initiation of blood transfusion, required this, admission for further monitoring, management. Final Clinical Impression(s) / ED Diagnoses Final diagnoses:  Occult GI bleeding  Symptomatic anemia   MDM Number of Diagnoses or Management Options Occult GI bleeding: new, needed workup Symptomatic anemia: new, needed workup   Amount and/or Complexity of Data Reviewed Clinical lab tests: reviewed Tests in the radiology section of CPT: reviewed Tests in the medicine section of CPT: reviewed Decide to obtain previous medical records or to obtain history from someone other than the patient: yes Obtain history from someone other than the patient: yes Review and summarize past medical records: yes Discuss the patient with other providers: yes Independent visualization of images, tracings, or specimens: yes  Risk of Complications, Morbidity, and/or Mortality Presenting problems: high Diagnostic  procedures: high Management options: high  Critical Care Total time providing critical care: 30-74 minutes (35)  Patient Progress Patient progress: stable     Carmin Muskrat, MD 07/24/20 2353

## 2020-07-24 NOTE — ED Triage Notes (Addendum)
Patient c/o SOB and bilateral lower extremity swelling  x 4 days. Patient also c/o hypoglycemia (34 at home) CBG in triage-282. Patient denies any chest pain.

## 2020-07-25 ENCOUNTER — Inpatient Hospital Stay (HOSPITAL_COMMUNITY): Payer: 59

## 2020-07-25 DIAGNOSIS — D649 Anemia, unspecified: Secondary | ICD-10-CM

## 2020-07-25 DIAGNOSIS — R195 Other fecal abnormalities: Secondary | ICD-10-CM

## 2020-07-25 DIAGNOSIS — Z794 Long term (current) use of insulin: Secondary | ICD-10-CM

## 2020-07-25 DIAGNOSIS — I5033 Acute on chronic diastolic (congestive) heart failure: Secondary | ICD-10-CM | POA: Diagnosis not present

## 2020-07-25 DIAGNOSIS — E1169 Type 2 diabetes mellitus with other specified complication: Secondary | ICD-10-CM

## 2020-07-25 DIAGNOSIS — N179 Acute kidney failure, unspecified: Secondary | ICD-10-CM

## 2020-07-25 LAB — URINALYSIS, ROUTINE W REFLEX MICROSCOPIC
Bilirubin Urine: NEGATIVE
Glucose, UA: NEGATIVE mg/dL
Ketones, ur: NEGATIVE mg/dL
Leukocytes,Ua: NEGATIVE
Nitrite: NEGATIVE
Protein, ur: 100 mg/dL — AB
Specific Gravity, Urine: 1.009 (ref 1.005–1.030)
pH: 6 (ref 5.0–8.0)

## 2020-07-25 LAB — ECHOCARDIOGRAM COMPLETE
Area-P 1/2: 3.21 cm2
Height: 80 in
S' Lateral: 2.8 cm
Weight: 3633.6 oz

## 2020-07-25 LAB — HIV ANTIBODY (ROUTINE TESTING W REFLEX): HIV Screen 4th Generation wRfx: NONREACTIVE

## 2020-07-25 LAB — BASIC METABOLIC PANEL
Anion gap: 9 (ref 5–15)
BUN: 26 mg/dL — ABNORMAL HIGH (ref 6–20)
CO2: 22 mmol/L (ref 22–32)
Calcium: 8.3 mg/dL — ABNORMAL LOW (ref 8.9–10.3)
Chloride: 104 mmol/L (ref 98–111)
Creatinine, Ser: 1.57 mg/dL — ABNORMAL HIGH (ref 0.61–1.24)
GFR, Estimated: 51 mL/min — ABNORMAL LOW (ref >60)
Glucose, Bld: 151 mg/dL — ABNORMAL HIGH (ref 70–99)
Potassium: 3.8 mmol/L (ref 3.5–5.1)
Sodium: 135 mmol/L (ref 135–145)

## 2020-07-25 LAB — HEMOGLOBIN A1C
Hgb A1c MFr Bld: 7.2 % — ABNORMAL HIGH (ref 4.8–5.6)
Mean Plasma Glucose: 159.94 mg/dL

## 2020-07-25 LAB — GLUCOSE, CAPILLARY
Glucose-Capillary: 116 mg/dL — ABNORMAL HIGH (ref 70–99)
Glucose-Capillary: 135 mg/dL — ABNORMAL HIGH (ref 70–99)
Glucose-Capillary: 143 mg/dL — ABNORMAL HIGH (ref 70–99)
Glucose-Capillary: 150 mg/dL — ABNORMAL HIGH (ref 70–99)
Glucose-Capillary: 162 mg/dL — ABNORMAL HIGH (ref 70–99)
Glucose-Capillary: 191 mg/dL — ABNORMAL HIGH (ref 70–99)

## 2020-07-25 LAB — CBC
HCT: 23.5 % — ABNORMAL LOW (ref 39.0–52.0)
Hemoglobin: 7.5 g/dL — ABNORMAL LOW (ref 13.0–17.0)
MCH: 27.6 pg (ref 26.0–34.0)
MCHC: 31.9 g/dL (ref 30.0–36.0)
MCV: 86.4 fL (ref 80.0–100.0)
Platelets: 307 10*3/uL (ref 150–400)
RBC: 2.72 MIL/uL — ABNORMAL LOW (ref 4.22–5.81)
RDW: 15 % (ref 11.5–15.5)
WBC: 7.1 10*3/uL (ref 4.0–10.5)
nRBC: 0 % (ref 0.0–0.2)

## 2020-07-25 LAB — PREPARE RBC (CROSSMATCH)

## 2020-07-25 LAB — HEMOGLOBIN AND HEMATOCRIT, BLOOD
HCT: 28.8 % — ABNORMAL LOW (ref 39.0–52.0)
Hemoglobin: 9.2 g/dL — ABNORMAL LOW (ref 13.0–17.0)

## 2020-07-25 LAB — TYPE AND SCREEN

## 2020-07-25 LAB — CBG MONITORING, ED: Glucose-Capillary: 178 mg/dL — ABNORMAL HIGH (ref 70–99)

## 2020-07-25 MED ORDER — LABETALOL HCL 5 MG/ML IV SOLN: 5.0000 mg | INTRAVENOUS | Status: DC | PRN

## 2020-07-25 MED ORDER — CARVEDILOL 3.125 MG PO TABS
3.1250 mg | ORAL_TABLET | Freq: Two times a day (BID) | ORAL | Status: DC
  Administered 2020-07-25 – 2020-07-31 (×11): 3.125 mg via ORAL
  Filled 2020-07-25 (×11): qty 1

## 2020-07-25 MED ORDER — FLUTICASONE PROPIONATE 50 MCG/ACT NA SUSP
2.0000 | Freq: Every day | NASAL | Status: DC
  Administered 2020-07-25 – 2020-07-30 (×5): 2 via NASAL
  Filled 2020-07-25: qty 16

## 2020-07-25 MED ORDER — SODIUM CHLORIDE 0.9% IV SOLUTION: Freq: Once | INTRAVENOUS | Status: AC

## 2020-07-25 MED ORDER — LORATADINE 10 MG PO TABS
10.0000 mg | ORAL_TABLET | Freq: Every day | ORAL | Status: DC
  Administered 2020-07-25 – 2020-07-31 (×7): 10 mg via ORAL
  Filled 2020-07-25 (×7): qty 1

## 2020-07-25 MED ORDER — FUROSEMIDE 10 MG/ML IJ SOLN
20.0000 mg | Freq: Once | INTRAMUSCULAR | Status: AC
  Administered 2020-07-25: 20 mg via INTRAVENOUS
  Filled 2020-07-25: qty 2

## 2020-07-25 MED ORDER — ACETAMINOPHEN 325 MG PO TABS
650.0000 mg | ORAL_TABLET | Freq: Once | ORAL | Status: AC
  Administered 2020-07-25: 650 mg via ORAL
  Filled 2020-07-25: qty 2

## 2020-07-25 MED ORDER — DIPHENHYDRAMINE HCL 25 MG PO CAPS
25.0000 mg | ORAL_CAPSULE | Freq: Once | ORAL | Status: AC
  Administered 2020-07-25: 25 mg via ORAL
  Filled 2020-07-25: qty 1

## 2020-07-25 MED ORDER — MELATONIN 5 MG PO TABS
5.0000 mg | ORAL_TABLET | Freq: Once | ORAL | Status: AC
  Administered 2020-07-25: 5 mg via ORAL

## 2020-07-25 MED ORDER — FUROSEMIDE 10 MG/ML IJ SOLN
40.0000 mg | Freq: Two times a day (BID) | INTRAMUSCULAR | Status: DC
  Administered 2020-07-25 (×2): 40 mg via INTRAVENOUS
  Filled 2020-07-25 (×2): qty 4

## 2020-07-25 NOTE — Progress Notes (Signed)
Patient states heart burn every other day as his only GI sx.   No n/v/d/abd. Tenderness Last bm 3/5 formed, no bright or dark blood noted    Note worthy-left foot toes amputated

## 2020-07-25 NOTE — Progress Notes (Addendum)
PROGRESS NOTE    Jeffrey Young  VEL:[id number] DOB: Nov 04, 1965 DOA: 07/24/2020 PCP: [name], MD    Chief Complaint  Patient presents with  . Hypoglycemia  . Shortness of Breath    Brief Narrative:  Pleasant 55 year old gentleman history of type 2 diabetes, history of left transmetatarsal amputation of the foot, history of colonic polyps including 30 mm polyp which was unable to be resected on colonoscopy January 2020.  Patient was referred to [facility] for resection however all elective procedures were canceled at that time due to the Covid pandemic.  Patient with no prior history of heart failure. Patient presented to the ED with complaints of lightheadedness noted to have a blood glucose level of 34, 5-day history of exertional dyspnea, worsening lower extremity edema, abdominal swelling, cough all which are new.  Patient seen in the ED noted to have systolic blood pressures in the 160s, chest x-ray consistent with pulmonary edema, elevated BNP, elevated creatinine of 1.6, hemoglobin of 6.0.  Patient denied any melanotic stools, hematochezia, hematemesis.  FOBT noted to be positive. 2 units of packed red blood cells ordered for transfusion. GI consulted.   Assessment & Plan:   Principal Problem:   New onset of congestive heart failure (HCC) Active Problems:   DM2 (diabetes mellitus, type 2) ([place])   Anemia   GIB (gastrointestinal bleeding)   AKI (acute kidney injury) ([place])  1 new onset CHF Questionable etiology.  Patient with a history of type 2 diabetes, history of transmetatarsal amputation, noted to have elevated blood pressure on admission with new onset CHF with dyspnea on exertion, lower extremity edema. New onset CHF could partly be secondary to subacute on chronic GI bleed leading to significant anemia as patient presented with a hemoglobin of 6.0.  Patient given a dose of Lasix 60 mg IV x1 on presentation with urine output of 950 cc.  EKG with normal sinus rhythm with Q  waves in lead III.  Patient denies any chest pain.  Check a 2D echo which was done this morning but pending.  Place on Lasix 40 mg IV every 12 hours.  Start Coreg 3.125 mg p.o. twice daily.  Strict I's and O's.  Daily weights.  Transfuse packed red blood cells to keep hemoglobin greater than 8.  Consult with cardiology in the a.m.  2.  Symptomatic anemia/GI bleed Likely subacute on chronic slow GI bleed.  Patient denies any significant melanotic stools or hematochezia.  FOBT positive.  Hemoglobin noted to be 6.0 on admission.  Patient transfused 2 units packed red blood cells with posttransfusion hemoglobin at 7.5. -Patient noted to have had a 30 mm descending colonic polyp in January 2020 that was never removed as all elective surgeries were canceled in January 2020 due to Covid pandemic as patient was referred to [facility]. -Transfuse another unit of packed red blood cells to decrease cardiac demand. -GI consulted for further evaluation and management. -Tolerating clears.  3.  Diabetes mellitus type 2 Patient noted to have had a hypoglycemic spell on admission with a CBG of 34.  Hemoglobin A1c 7.2.   -CBG this morning at 116 -Continue to hold Lantus. -CBG every 4 hours. -Sliding scale insulin.  4.  Acute kidney injury Likely secondary to problem #1.  Urinalysis nitrite negative, leukocytes negative, 100 protein.  Patient received a dose of Lasix 60 mg IV x1 with a urine output of 950 cc. Patient on IV Lasix.  Monitor urine output.  Follow renal function.  5.  DVT prophylaxis: SCDs Code Status: Full Family Communication: Updated patient.  No family at bedside. Disposition:   Status is: Inpatient    Dispo: The patient is from: Home              Anticipated d/c is to: Home              Patient currently being transfused packed red blood cells, anemia work-up underway, on IV diuretics due to new onset CHF exacerbation.  Not stable for discharge.   Difficult to place patient  no       Consultants:   Gastroenterology: [name] 07/25/2020  Procedures:   Transfusion 2 units packed red blood cells 07/25/2020  Transfusion 1 unit packed red blood cells pending 07/25/2020  2D echo pending 07/25/2020  Chest x-ray 07/24/2020    Antimicrobials:   None   Subjective: Sleeping but arousable.  States was short of breath on exertion prior to admission.  Has not been able to ambulate to determine whether shortness of breath is improved overall feeling better than on admission.  Feels lower extremity edema has improved.  Denies chest pain.  No abdominal pain.  Tolerating clears.  Objective: Vitals:   07/25/20 0508 07/25/20 0515 07/25/20 0533 07/25/20 0830  BP: (!) 155/79 (!) 155/79 (!) 156/93 (!) 159/87  Pulse: 85 88 87 86  Resp: 18 (!) 22 (!) 22   Temp: 98.5 F (36.9 C) 98.5 F (36.9 C) 98.4 F (36.9 C) 98.5 F (36.9 C)  TempSrc: Oral Oral Oral Oral  SpO2: 97% 97% 99% 92%  Weight:      Height:        Intake/Output Summary (Last 24 hours) at 07/25/2020 1157 Last data filed at 07/25/2020 1133 Gross per 24 hour  Intake 1461.25 ml  Output 2400 ml  Net -938.75 ml   Filed Weights   07/24/20 1734 07/25/20 0127  Weight: 99.8 kg 103 kg    Examination:  General exam: Appears calm and comfortable  Respiratory system: Some scattered crackles.  No wheezing.  No rhonchi.  No use of accessory muscles of respiration.  Speaking in full sentences.   Cardiovascular system: S1 & S2 heard, RRR. No JVD, murmurs, rubs, gallops or clicks.  2-3+ bilateral lower extremity edema. Gastrointestinal system: Abdomen is nondistended, soft and nontender. No organomegaly or masses felt. Normal bowel sounds heard. Central nervous system: Alert and oriented. No focal neurological deficits. Extremities: Left transmetatarsal amputation foot.  2-3+ bilateral lower extremity edema. Skin: No rashes, lesions or ulcers Psychiatry: Judgement and insight appear normal. Mood & affect  appropriate.     Data Reviewed: I have personally reviewed following labs and imaging studies  CBC: Recent Labs  Lab 07/24/20 2125 07/25/20 1044  WBC 7.2 7.1  NEUTROABS 4.7  --   HGB 6.0* 7.5*  HCT 20.3* 23.5*  MCV 86.4 86.4  PLT 281 048    Basic Metabolic Panel: Recent Labs  Lab 07/24/20 2125 07/25/20 1044  NA 136 135  K 4.3 3.8  CL 107 104  CO2 20* 22  GLUCOSE 208* 151*  BUN 32* 26*  CREATININE 1.64* 1.57*  CALCIUM 8.5* 8.3*    GFR: Estimated Creatinine Clearance: 65.4 mL/min (A) (by C-G formula based on SCr of 1.57 mg/dL (H)).  Liver Function Tests: Recent Labs  Lab 07/24/20 2125  AST 26  ALT 19  ALKPHOS 118  BILITOT 0.8  PROT 6.8  ALBUMIN 3.3*    CBG: Recent Labs  Lab 07/24/20 1741 07/25/20 0035 07/25/20  0410 07/25/20 0836 07/25/20 1142  GLUCAP 282* 178* 143* 116* 150*     No results found for this or any previous visit (from the past 240 hour(s)).       Radiology Studies: DG Chest 2 View  Result Date: 07/24/2020 CLINICAL DATA:  Shortness of breath, lower extremity swelling, hyperglycemia EXAM: CHEST - 2 VIEW COMPARISON:  01/21/2018 FINDINGS: Diffuse hazy interstitial opacities throughout the lungs with peripheral septal and fissural thickening. Bilateral pleural effusions. No pneumothorax. Congested pulmonary vascularity. Cardiac silhouette appears enlarged from comparison radiograph accounting for differences in technique. Remaining cardiomediastinal contours are unremarkable. No acute osseous or soft tissue abnormality. IMPRESSION: Findings consistent with CHF including cardiomegaly, vascular congestion, interstitial edema and bilateral pleural effusions. Electronically Signed   By: Lovena Le M.D.   On: 07/24/2020 19:11   ECHOCARDIOGRAM COMPLETE  Result Date: 07/25/2020    ECHOCARDIOGRAM REPORT   Patient Name:   Jeffrey Young Date of Exam: 07/25/2020 Medical Rec #:  [id number]      Height:       80.0 in Accession #:    [id number]      Weight:       227.1 lb Date of Birth:  Mar 10, 1966      BSA:          2.427 m Patient Age:    28 years       BP:           156/93 mmHg Patient Gender: M              HR:           83 bpm. Exam Location:  Inpatient Procedure: 2D Echo, Cardiac Doppler and Color Doppler Indications:    I50.33 Acute on chronic diastolic (congestive) heart failure  History:        Patient has no prior history of Echocardiogram examinations.                 Risk Factors:Diabetes.  Sonographer:    Jonelle Sidle Dance Referring Phys: [place]  1. Left ventricular ejection fraction, by estimation, is 55 to 60%. The left ventricle has normal function. The left ventricle has no regional wall motion abnormalities. There is mild left ventricular hypertrophy. Left ventricular diastolic parameters are indeterminate.  2. Right ventricular systolic function is normal. The right ventricular size is normal. Tricuspid regurgitation signal is inadequate for assessing PA pressure.  3. There is a trivial pericardial effusion anterior to the right ventricle.  4. The mitral valve is grossly normal. Mild mitral valve regurgitation.  5. The aortic valve is tricuspid. Aortic valve regurgitation is not visualized.  6. The inferior vena cava is dilated in size with >50% respiratory variability, suggesting right atrial pressure of 8 mmHg. FINDINGS  Left Ventricle: Left ventricular ejection fraction, by estimation, is 55 to 60%. The left ventricle has normal function. The left ventricle has no regional wall motion abnormalities. The left ventricular internal cavity size was normal in size. There is  mild left ventricular hypertrophy. Left ventricular diastolic parameters are indeterminate. Right Ventricle: The right ventricular size is normal. No increase in right ventricular wall thickness. Right ventricular systolic function is normal. Tricuspid regurgitation signal is inadequate for assessing PA pressure. Left Atrium: Left atrial size was  normal in size. Right Atrium: Right atrial size was normal in size. Pericardium: Trivial pericardial effusion is present. The pericardial effusion is anterior to the right ventricle. Mitral Valve: The mitral valve is grossly normal. Mild mitral  valve regurgitation. Tricuspid Valve: The tricuspid valve is grossly normal. Tricuspid valve regurgitation is trivial. Aortic Valve: The aortic valve is tricuspid. Aortic valve regurgitation is not visualized. Pulmonic Valve: The pulmonic valve was grossly normal. Pulmonic valve regurgitation is trivial. Aorta: The aortic root is normal in size and structure. Venous: The inferior vena cava is dilated in size with greater than 50% respiratory variability, suggesting right atrial pressure of 8 mmHg. IAS/Shunts: The interatrial septum appears to be lipomatous. No atrial level shunt detected by color flow Doppler.  LEFT VENTRICLE PLAX 2D LVIDd:         3.90 cm  Diastology LVIDs:         2.80 cm  LV e' medial:    7.29 cm/s LV PW:         1.30 cm  LV E/e' medial:  18.1 LV IVS:        1.10 cm  LV e' lateral:   8.38 cm/s LVOT diam:     2.20 cm  LV E/e' lateral: 15.8 LV SV:         89 LV SV Index:   37 LVOT Area:     3.80 cm  RIGHT VENTRICLE             IVC RV Basal diam:  2.50 cm     IVC diam: 2.50 cm RV S prime:     13.70 cm/s TAPSE (M-mode): 2.6 cm LEFT ATRIUM             Index       RIGHT ATRIUM           Index LA diam:        4.40 cm 1.81 cm/m  RA Area:     13.50 cm LA Vol (A2C):   75.5 ml 31.11 ml/m RA Volume:   25.70 ml  10.59 ml/m LA Vol (A4C):   34.5 ml 14.21 ml/m LA Biplane Vol: 55.9 ml 23.03 ml/m  AORTIC VALVE LVOT Vmax:   126.00 cm/s LVOT Vmean:  80.700 cm/s LVOT VTI:    0.235 m  AORTA Ao Root diam: 3.50 cm Ao Asc diam:  3.30 cm MITRAL VALVE MV Area (PHT): 3.21 cm     SHUNTS MV Decel Time: 236 msec     Systemic VTI:  0.24 m MV E velocity: 132.00 cm/s  Systemic Diam: 2.20 cm MV A velocity: 114.00 cm/s MV E/A ratio:  1.16 Rozann Lesches MD Electronically signed by  Rozann Lesches MD Signature Date/Time: 07/25/2020/11:07:06 AM    Final         Scheduled Meds: . sodium chloride   Intravenous Once  . acetaminophen  650 mg Oral Once  . diphenhydrAMINE  25 mg Oral Once  . furosemide  20 mg Intravenous Once  . furosemide  40 mg Intravenous Q12H  . insulin aspart  0-9 Units Subcutaneous Q4H  . sodium chloride flush  3 mL Intravenous Q12H   Continuous Infusions: . sodium chloride       LOS: 1 day    Time spent: 40 minutes  No charge    Irine Seal, MD [facility]   To contact the attending provider between 7A-7P or the covering provider during after hours 7P-7A, please log into the web site [email or web address] and access using universal [place] password for that web site. If you do not have the password, please call the hospital operator.  07/25/2020, 11:57 AM

## 2020-07-25 NOTE — Plan of Care (Signed)
Patient continues to diurese, states shortness of breath is improved.  Wife at bedside.

## 2020-07-25 NOTE — Consult Note (Addendum)
Referring Provider: [facility] Primary Care Physician:  [name], MD Primary Gastroenterologist:  Dr. Benson Norway  Reason for Consultation: Heme positive stool with severe anemia  HPI: Jeffrey Young is a 55 y.o. male admitted through the emergency room last night with lightheadedness and evidence of CHF, characterized by dyspnea and lower extremity edema, Covid negative.  He was noted to be Hemoccult positive in the emergency room.  His blood work showed a hemoglobin of 6.0 with an MCV of 86 and RDW of 15.8, not strongly suggestive of chronic iron deficiency anemia.  Unfortunately, the patient was transfused before iron studies were obtained, so his iron status is not known.  No known exposure to ulcerogenic medications such as ASA or NSAID's.  The patient denies any GI symptoms such as heartburn, nausea, abdominal pain, rectal bleeding, constipation, or dark stools.  It does not appear that the patient was on any ulcerogenic medications prior to admission, nor on PPI therapy.  The patient is known to have a large polyp in the region of the splenic flexure, evaluated as an outpatient by Dr. Carol Ada.  The polyp was felt to be too large, and in too difficult a location, to be removed by standard technique, so he was referred to [facility] 2 years ago for endoscopic mucosal resection but unfortunately, the procedure got deferred because of the Covid pandemic and has still not been performed.    The patient has received 2 units of packed cells since admission, and will be receiving a third.  An echocardiogram has been performed, but results are pending.   Past Medical History:  Diagnosis Date  . Diabetes mellitus without complication ([place])    type 2    Past Surgical History:  Procedure Laterality Date  . AMPUTATION Left 01/22/2018   Procedure: TRANSMETATARSAL AMPUTATION;  Surgeon: Newt Minion, MD;  Location: [facility];  Service: Orthopedics;  Laterality: Left;toes  .  COLONOSCOPY WITH PROPOFOL N/A 05/24/2018   Procedure: COLONOSCOPY WITH PROPOFOL;  Surgeon: Carol Ada, MD;  Location: [facility];  Service: Endoscopy;  Laterality: N/A;  . POLYPECTOMY  05/24/2018   Procedure: POLYPECTOMY;  Surgeon: Carol Ada, MD;  Location: [facility];  Service: Endoscopy;;    Prior to Admission medications   Medication Sig Start Date End Date Taking? Authorizing Provider  cetirizine (ZYRTEC) 10 MG tablet Take 10 mg by mouth daily as needed for allergies.    [provider]  Insulin Glargine (LANTUS SOLOSTAR) 100 UNIT/ML Solostar Pen Inject 20 Units into the skin at bedtime.    [provider]  tobramycin (TOBREX) 0.3 % ophthalmic solution Place 1 drop into both eyes See admin instructions. Use as needed following eye injection for dry, grainy, irritated eyes.    [provider]    Current Facility-Administered Medications  Medication Dose Route Frequency Provider Last Rate Last Admin  . 0.9 %  sodium chloride infusion  250 mL Intravenous PRN Etta Quill, DO      . acetaminophen (TYLENOL) tablet 650 mg  650 mg Oral Q4H PRN Etta Quill, DO      . furosemide (LASIX) injection 40 mg  40 mg Intravenous Q12H Eugenie Filler, MD   40 mg at 07/25/20 0853  . insulin aspart (novoLOG) injection 0-9 Units  0-9 Units Subcutaneous Q4H Etta Quill, DO   2 Units at 07/25/20 0044  . labetalol (NORMODYNE) injection 5-10 mg  5-10 mg Intravenous Q2H PRN Etta Quill, DO      .  ondansetron (ZOFRAN) injection 4 mg  4 mg Intravenous Q6H PRN Etta Quill, DO      . sodium chloride flush (NS) 0.9 % injection 3 mL  3 mL Intravenous Q12H Jennette Kettle M, DO   3 mL at 07/25/20 0900  . sodium chloride flush (NS) 0.9 % injection 3 mL  3 mL Intravenous PRN Etta Quill, DO        Allergies as of 07/24/2020 - Review Complete 07/24/2020  Allergen Reaction Noted  . Bee venom Swelling 01/21/2018  . Latex Rash 01/26/2018    Family History   Problem Relation Age of Onset  . Hypertension Father     Social History   Socioeconomic History  . Marital status: Married    Spouse name: Not on file  . Number of children: Not on file  . Years of education: Not on file  . Highest education level: Not on file  Occupational History  . Not on file  Tobacco Use  . Smoking status: Never Smoker  . Smokeless tobacco: Never Used  Vaping Use  . Vaping Use: Never used  Substance and Sexual Activity  . Alcohol use: Yes    Alcohol/week: 1.0 standard drink    Types: 1 Cans of beer per week    Comment: occasional  . Drug use: Never  . Sexual activity: Yes  Other Topics Concern  . Not on file  Social History Narrative  . Not on file   Social Determinants of Health   Financial Resource Strain: Not on file  Food Insecurity: Not on file  Transportation Needs: Not on file  Physical Activity: Not on file  Stress: Not on file  Social Connections: Not on file  Intimate Partner Violence: Not on file    Review of Systems: positive for some blurry vision (resolved post transfusion)/lightheadedness, exertional dyspnea with cough, and lower extremity edema.  Negative for focal neurologic symptoms, chest pain, urinary symptoms, skin rashes, adenopathy.  Physical Exam: Vital signs in last 24 hours: Temp:  [98.2 F (36.8 C)-98.8 F (37.1 C)] 98.5 F (36.9 C) (03/06 0830) Pulse Rate:  [83-96] 86 (03/06 0830) Resp:  [18-28] 22 (03/06 0533) BP: (155-200)/(79-96) 159/87 (03/06 0830) SpO2:  [92 %-100 %] 92 % (03/06 0830) Weight:  [99.8 kg-103 kg] 103 kg (03/06 0127) Last BM Date: 07/24/20 General:   Alert,  Well-developed, well-nourished, pleasant and cooperative in NAD sitting in bedside chair Head:  Normocephalic and atraumatic. Eyes:  Sclera clear, no icterus.   Conjunctiva pale. Neck:   No masses or thyromegaly. Lungs: End inspiratory crackles at the left base, otherwise clear.  No respiratory distress whatsoever. Heart:   Regular  rate and rhythm; no murmurs, clicks, rubs,  or gallops. Abdomen: Mildly adipose, nontender. Rectal: No obvious masses or prostatic abnormalities.  Stool is light tan in color.  (Was Hemoccult positive through the emergency room, not sent for repeat guaiac at this time) Msk:   Symmetrical without gross deformities. Pulses:  Normal radial pulse is noted. Extremities:   Mild if any tibial edema at this time, following diuresis. Neurologic:  Alert and coherent;  grossly normal neurologically. Skin:  Intact without significant lesions or rashes. Cervical Nodes:  No significant cervical adenopathy. Psych:   Alert and cooperative. Normal mood and affect.  Intake/Output from previous day: 03/05 0701 - 03/06 0700 In: 1218.8 [P.O.:360; Blood:858.8] Out: 700 [Urine:700] Intake/Output this shift: Total I/O In: 242.5 [Blood:242.5] Out: -   Lab Results: Recent Labs    07/24/20  2125  WBC 7.2  HGB 6.0*  HCT 20.3*  PLT 281   BMET Recent Labs    07/24/20 2125  NA 136  K 4.3  CL 107  CO2 20*  GLUCOSE 208*  BUN 32*  CREATININE 1.64*  CALCIUM 8.5*   LFT Recent Labs    07/24/20 2125  PROT 6.8  ALBUMIN 3.3*  AST 26  ALT 19  ALKPHOS 118  BILITOT 0.8   PT/INR No results for input(s): LABPROT, INR in the last 72 hours.  Studies/Results: DG Chest 2 View  Result Date: 07/24/2020 CLINICAL DATA:  Shortness of breath, lower extremity swelling, hyperglycemia EXAM: CHEST - 2 VIEW COMPARISON:  01/21/2018 FINDINGS: Diffuse hazy interstitial opacities throughout the lungs with peripheral septal and fissural thickening. Bilateral pleural effusions. No pneumothorax. Congested pulmonary vascularity. Cardiac silhouette appears enlarged from comparison radiograph accounting for differences in technique. Remaining cardiomediastinal contours are unremarkable. No acute osseous or soft tissue abnormality. IMPRESSION: Findings consistent with CHF including cardiomegaly, vascular congestion, interstitial  edema and bilateral pleural effusions. Electronically Signed   By: Lovena Le M.D.   On: 07/24/2020 19:11    Impression: 1.  Severe anemia with occultly (not grossly) heme positive stool 2.  History of large polyp near the splenic flexure 2 years ago, not yet removed, current status unknown 3.  Heart failure associated with anemia; echo results pending to determine intrinsic cardiac function versus high output failure.  Plan: 1.  The patient is clearly not having active bleeding, so GI evaluation can be elective rather than emergent.  2.  Agree with plan for transfusion and cardiac evaluation  3.  I will ask Dr. Benson Norway, who has seen the patient previously, to see the patient tomorrow and decide on whether to proceed with endoscopic and/or colonoscopic evaluation during this admission.   LOS: 1 day   [place]  07/25/2020, 10:11 AM   Pager [phone] If no answer or after 5 PM call [phone]

## 2020-07-25 NOTE — H&P (Signed)
History and Physical    Jeffrey Young TKP:[id number] DOB: Jul 18, 1965 DOA: 07/24/2020  PCP: [name], MD  Patient coming from: Home  I have personally briefly reviewed patient's old medical records in [place]  Chief Complaint: SOB, hypoglycemia  HPI: Jeffrey Young is a 55 y.o. male with medical history significant of DM2, transmetatarsal amputation of foot.  Pt has h/o colon polyps including a 50mm polyp that they wernt able to resect on colonoscopy in Jan 2020.  He was referred to [facility] for resection; however, [facility] then canceled all elective procedures due to the COVID pandemic hitting.  Pt with no known h/o CHF.  Pt presents to ED with c/o lightheadedness earlier today, BGL 34.  Also over past 5 days has had exertional dyspnea, BLE swelling, abd swelling, cough, all of which are new.  Also DOE.  Symptoms constant.  Symptoms moderate to severe.   ED Course: BP 517G systolic (no formal dx of HTN).  CXR with CHF findings and pulm edema.  BNP 210.  Creat 1.6 up from 1.0 in 2019.  HGB 6.0.  No melena nor hematochezia.  Hemoccult is positive.   Review of Systems: As per HPI, otherwise all review of systems negative.  Past Medical History:  Diagnosis Date  . Diabetes mellitus without complication ([place])    type 2    Past Surgical History:  Procedure Laterality Date  . AMPUTATION Left 01/22/2018   Procedure: TRANSMETATARSAL AMPUTATION;  Surgeon: Newt Minion, MD;  Location: [place];  Service: Orthopedics;  Laterality: Left;toes  . COLONOSCOPY WITH PROPOFOL N/A 05/24/2018   Procedure: COLONOSCOPY WITH PROPOFOL;  Surgeon: Carol Ada, MD;  Location: [facility];  Service: Endoscopy;  Laterality: N/A;  . POLYPECTOMY  05/24/2018   Procedure: POLYPECTOMY;  Surgeon: Carol Ada, MD;  Location: [facility];  Service: Endoscopy;;     reports that he has never smoked. He has never used smokeless tobacco. He reports current alcohol use of about 1.0 standard drink of  alcohol per week. He reports that he does not use drugs.  Allergies  Allergen Reactions  . Bee Venom Swelling    Cold Sweats  . Latex Rash    Family History  Problem Relation Age of Onset  . Hypertension Father      Prior to Admission medications   Medication Sig Start Date End Date Taking? Authorizing Provider  cetirizine (ZYRTEC) 10 MG tablet Take 10 mg by mouth daily as needed for allergies.    [provider]  Insulin Glargine (LANTUS SOLOSTAR) 100 UNIT/ML Solostar Pen Inject 20 Units into the skin at bedtime.    [provider]  tobramycin (TOBREX) 0.3 % ophthalmic solution Place 1 drop into both eyes See admin instructions. Use as needed following eye injection for dry, grainy, irritated eyes.    [provider]    Physical Exam: Vitals:   07/24/20 1734 07/24/20 2045 07/24/20 2146 07/24/20 2329  BP: (!) 171/88 (!) 200/96 (!) 177/95 (!) 169/88  Pulse: 96 93 84 83  Resp: 18 (!) 28 (!) 22 20  Temp: 98.8 F (37.1 C)     TempSrc: Oral     SpO2: 94% 100% 100% 96%  Weight: 99.8 kg     Height: 6\' 1"  (1.854 m)       Constitutional: NAD, calm, comfortable Eyes: PERRL, lids and conjunctivae normal ENMT: Mucous membranes are moist. Posterior pharynx clear of any exudate or lesions.Normal dentition.  Neck: normal, supple, no masses, no thyromegaly Respiratory: clear to  auscultation bilaterally, no wheezing, no crackles. Normal respiratory effort. No accessory muscle use.  Cardiovascular: Regular rate and rhythm, no murmurs / rubs / gallops. Edema and anasarca present. 2+ pedal pulses. No carotid bruits.  Abdomen: no tenderness, no masses palpated. No hepatosplenomegaly. Bowel sounds positive.  Musculoskeletal: no clubbing / cyanosis. No joint deformity upper and lower extremities. Good ROM, no contractures. Normal muscle tone.  Skin: no rashes, lesions, ulcers. No induration Neurologic: CN 2-12 grossly intact. Sensation intact, DTR normal. Strength 5/5  in all 4.  Psychiatric: Normal judgment and insight. Alert and oriented x 3. Normal mood.    Labs on Admission: I have personally reviewed following labs and imaging studies  CBC: Recent Labs  Lab 07/24/20 2125  WBC 7.2  NEUTROABS 4.7  HGB 6.0*  HCT 20.3*  MCV 86.4  PLT 423   Basic Metabolic Panel: Recent Labs  Lab 07/24/20 2125  NA 136  K 4.3  CL 107  CO2 20*  GLUCOSE 208*  BUN 32*  CREATININE 1.64*  CALCIUM 8.5*   GFR: Estimated Creatinine Clearance: 61.8 mL/min (A) (by C-G formula based on SCr of 1.64 mg/dL (H)). Liver Function Tests: Recent Labs  Lab 07/24/20 2125  AST 26  ALT 19  ALKPHOS 118  BILITOT 0.8  PROT 6.8  ALBUMIN 3.3*   No results for input(s): LIPASE, AMYLASE in the last 168 hours. No results for input(s): AMMONIA in the last 168 hours. Coagulation Profile: No results for input(s): INR, PROTIME in the last 168 hours. Cardiac Enzymes: No results for input(s): CKTOTAL, CKMB, CKMBINDEX, TROPONINI in the last 168 hours. BNP (last 3 results) No results for input(s): PROBNP in the last 8760 hours. HbA1C: No results for input(s): HGBA1C in the last 72 hours. CBG: Recent Labs  Lab 07/24/20 1741  GLUCAP 282*   Lipid Profile: No results for input(s): CHOL, HDL, LDLCALC, TRIG, CHOLHDL, LDLDIRECT in the last 72 hours. Thyroid Function Tests: No results for input(s): TSH, T4TOTAL, FREET4, T3FREE, THYROIDAB in the last 72 hours. Anemia Panel: No results for input(s): VITAMINB12, FOLATE, FERRITIN, TIBC, IRON, RETICCTPCT in the last 72 hours. Urine analysis: No results found for: COLORURINE, APPEARANCEUR, LABSPEC, [place], GLUCOSEU, HGBUR, BILIRUBINUR, KETONESUR, PROTEINUR, UROBILINOGEN, NITRITE, LEUKOCYTESUR  Radiological Exams on Admission: DG Chest 2 View  Result Date: 07/24/2020 CLINICAL DATA:  Shortness of breath, lower extremity swelling, hyperglycemia EXAM: CHEST - 2 VIEW COMPARISON:  01/21/2018 FINDINGS: Diffuse hazy interstitial opacities  throughout the lungs with peripheral septal and fissural thickening. Bilateral pleural effusions. No pneumothorax. Congested pulmonary vascularity. Cardiac silhouette appears enlarged from comparison radiograph accounting for differences in technique. Remaining cardiomediastinal contours are unremarkable. No acute osseous or soft tissue abnormality. IMPRESSION: Findings consistent with CHF including cardiomegaly, vascular congestion, interstitial edema and bilateral pleural effusions. Electronically Signed   By: Lovena Le M.D.   On: 07/24/2020 19:11    EKG: Independently reviewed.  Assessment/Plan Principal Problem:   New onset of congestive heart failure (HCC) Active Problems:   DM2 (diabetes mellitus, type 2) ([place])   Anemia   GIB (gastrointestinal bleeding)   AKI (acute kidney injury) ([place])    1. New onset CHF - 1. Most likely picture: pt with sub-acute to chronic GIB causing anemia which in turn is resulting in CHF. 2. DDx include HTN cardiomyopathy or other causes of CHF. 3. CHF pathway 4. Lasix 60mg  IV now 5. Strict intake and output 6. Repeat BMP in AM 7. Tele monitor 8. Watch for fluid overload with transfusion 9. 2d  echo 10. Add PRN labetalol 2. Anemia - 1. Suspect subacute vs chronic GIB as cause 2. 2u PRBC transfusion 3. Lasix before transfusion due to fluid overload 4. Watch out for TACO 5. Tele monitor 6. Repeat CBC in AM 3. GIB - 1. EDP sent message to [name] 2. Clear liquid diet 3. ? Relation to the 58mm descending colon polyp that was never removed in 2020? 1. GI hopefully to address this with addressing the GIB. 4. AKI - 1. Repeat BMP in AM with diuresis and transfusion 5. DM2 - 1. Hypoglycemia at home today 2. Hold lantus 3. SSI sensitive Q4H  DVT prophylaxis: SCDs Code Status: Full Family Communication: Wife at bedside Disposition Plan: Home after admit, CHF work up, anemia treatment, GIB work up. Consults called: EDP sent message to Dr.  Cristina Gong Admission status: Admit to inpatient  Severity of Illness: The appropriate patient status for this patient is INPATIENT. Inpatient status is judged to be reasonable and necessary in order to provide the required intensity of service to ensure the patient's safety. The patient's presenting symptoms, physical exam findings, and initial radiographic and laboratory data in the context of their chronic comorbidities is felt to place them at high risk for further clinical deterioration. Furthermore, it is not anticipated that the patient will be medically stable for discharge from the hospital within 2 midnights of admission. The following factors support the patient status of inpatient.   IP status due to: 1) new onset of CHF 2) anemia due to GIB requiring transfusion, complicated by end organ failure (CHF).   * I certify that at the point of admission it is my clinical judgment that the patient will require inpatient hospital care spanning beyond 2 midnights from the point of admission due to high intensity of service, high risk for further deterioration and high frequency of surveillance required.*    Temple Ewart M. DO [facility]  How to contact the [facility] Attending or Consulting provider [place] or covering provider during after hours [place], for this patient?  1. Check the care team in [facility] and look for a) attending/consulting [facility] provider listed and b) the [facility] team listed 2. Log into [email or web address]  Amion Physician Scheduling and messaging for groups and whole hospitals  On call and physician scheduling software for group practices, residents, hospitalists and other medical providers for call, clinic, rotation and shift schedules. OnCall [employer] is a hospital-wide system for scheduling doctors and paging doctors on call. EasyPlot is for scientific plotting and data analysis.  [email or web address]  and use [place]'s universal password to access. If you do not have the password,  please contact the hospital operator.  3. Locate the [facility] provider you are looking for under [facility] and page to a number that you can be directly reached. 4. If you still have difficulty reaching the provider, please page the [facility] (Director on Call) for the Hospitalists listed on amion for assistance.  07/25/2020, 12:01 AM

## 2020-07-25 NOTE — Progress Notes (Signed)
  Echocardiogram 2D Echocardiogram has been performed.  Jeffrey Young 07/25/2020, 8:42 AM

## 2020-07-25 NOTE — Progress Notes (Signed)
CHF education began. Pt instructed he may need a fluid restriction and to stay away from added salt. Pt education his heart isnt pumping as effectively and fluid building up in his body is making him short of breath. Pt states his cough has improved since receiving the lasix. Bp improved as well. Pt states he is anxious about illness.

## 2020-07-26 ENCOUNTER — Inpatient Hospital Stay (HOSPITAL_COMMUNITY): Payer: 59

## 2020-07-26 DIAGNOSIS — I10 Essential (primary) hypertension: Secondary | ICD-10-CM

## 2020-07-26 LAB — CBC WITH DIFFERENTIAL/PLATELET
Abs Immature Granulocytes: 0.02 10*3/uL (ref 0.00–0.07)
Basophils Absolute: 0.1 10*3/uL (ref 0.0–0.1)
Basophils Relative: 1 %
Eosinophils Absolute: 0.2 10*3/uL (ref 0.0–0.5)
Eosinophils Relative: 3 %
HCT: 27.4 % — ABNORMAL LOW (ref 39.0–52.0)
Hemoglobin: 8.3 g/dL — ABNORMAL LOW (ref 13.0–17.0)
Immature Granulocytes: 0 %
Lymphocytes Relative: 24 %
Lymphs Abs: 1.8 10*3/uL (ref 0.7–4.0)
MCH: 27.3 pg (ref 26.0–34.0)
MCHC: 30.3 g/dL (ref 30.0–36.0)
MCV: 90.1 fL (ref 80.0–100.0)
Monocytes Absolute: 1 10*3/uL (ref 0.1–1.0)
Monocytes Relative: 13 %
Neutro Abs: 4.5 10*3/uL (ref 1.7–7.7)
Neutrophils Relative %: 59 %
Platelets: 292 10*3/uL (ref 150–400)
RBC: 3.04 MIL/uL — ABNORMAL LOW (ref 4.22–5.81)
RDW: 15.1 % (ref 11.5–15.5)
WBC: 7.6 10*3/uL (ref 4.0–10.5)
nRBC: 0 % (ref 0.0–0.2)

## 2020-07-26 LAB — BPAM RBC
Blood Product Expiration Date: 202203312359
Blood Product Expiration Date: 202203312359
Blood Product Expiration Date: 202204012359
ISSUE DATE / TIME: 202203060200
ISSUE DATE / TIME: 202203060511
ISSUE DATE / TIME: 202203061330
Unit Type and Rh: 6200
Unit Type and Rh: 6200
Unit Type and Rh: 6200

## 2020-07-26 LAB — TYPE AND SCREEN
ABO/RH(D): A POS
Antibody Screen: NEGATIVE
Unit division: 0
Unit division: 0
Unit division: 0

## 2020-07-26 LAB — BASIC METABOLIC PANEL
Anion gap: 8 (ref 5–15)
BUN: 28 mg/dL — ABNORMAL HIGH (ref 6–20)
CO2: 24 mmol/L (ref 22–32)
Calcium: 8.6 mg/dL — ABNORMAL LOW (ref 8.9–10.3)
Chloride: 101 mmol/L (ref 98–111)
Creatinine, Ser: 1.95 mg/dL — ABNORMAL HIGH (ref 0.61–1.24)
GFR, Estimated: 39 mL/min — ABNORMAL LOW (ref >60)
Glucose, Bld: 118 mg/dL — ABNORMAL HIGH (ref 70–99)
Potassium: 3.7 mmol/L (ref 3.5–5.1)
Sodium: 133 mmol/L — ABNORMAL LOW (ref 135–145)

## 2020-07-26 LAB — URINE CULTURE: Culture: NO GROWTH

## 2020-07-26 LAB — GLUCOSE, CAPILLARY
Glucose-Capillary: 122 mg/dL — ABNORMAL HIGH (ref 70–99)
Glucose-Capillary: 124 mg/dL — ABNORMAL HIGH (ref 70–99)
Glucose-Capillary: 180 mg/dL — ABNORMAL HIGH (ref 70–99)
Glucose-Capillary: 177 mg/dL — ABNORMAL HIGH (ref 70–99)
Glucose-Capillary: 150 mg/dL — ABNORMAL HIGH (ref 70–99)

## 2020-07-26 LAB — HEMOGLOBIN AND HEMATOCRIT, BLOOD
HCT: 28.1 % — ABNORMAL LOW (ref 39.0–52.0)
Hemoglobin: 8.9 g/dL — ABNORMAL LOW (ref 13.0–17.0)

## 2020-07-26 MED ORDER — FUROSEMIDE 40 MG PO TABS
40.0000 mg | ORAL_TABLET | Freq: Two times a day (BID) | ORAL | Status: DC
  Administered 2020-07-26: 40 mg via ORAL
  Filled 2020-07-26: qty 1

## 2020-07-26 MED ORDER — HYDRALAZINE HCL 25 MG PO TABS
25.0000 mg | ORAL_TABLET | Freq: Three times a day (TID) | ORAL | Status: DC
  Administered 2020-07-26 – 2020-07-31 (×14): 25 mg via ORAL
  Filled 2020-07-26 (×14): qty 1

## 2020-07-26 MED ORDER — PEG 3350-KCL-NA BICARB-NACL 420 G PO SOLR
4000.0000 mL | Freq: Once | ORAL | Status: AC
  Administered 2020-07-26: 4000 mL via ORAL
  Filled 2020-07-26: qty 4000

## 2020-07-26 NOTE — Progress Notes (Signed)
Cre 1.95 GFR 36

## 2020-07-26 NOTE — Progress Notes (Signed)
Subjective: Feeling better.  He was able to ambulate yesterday without difficulty after his blood transfusion.  Objective: Vital signs in last 24 hours: Temp:  [97.7 F (36.5 C)-98.6 F (37 C)] 97.9 F (36.6 C) (03/07 0416) Pulse Rate:  [71-88] 71 (03/07 0757) BP: (118-174)/(84-102) 164/93 (03/07 0757) SpO2:  [92 %-100 %] 96 % (03/07 0416) Weight:  [102.2 kg] 102.2 kg (03/07 0032) Last BM Date: 07/25/20  Intake/Output from previous day: 03/06 0701 - 03/07 0700 In: 2776.7 [P.O.:2200; I.V.:19.2; Blood:557.5] Out: 9147 [Urine:4175] Intake/Output this shift: No intake/output data recorded.  General appearance: alert and no distress Resp: clear to auscultation bilaterally Cardio: regular rate and rhythm GI: soft, non-tender; bowel sounds normal; no masses,  no organomegaly Extremities: extremities normal, atraumatic, no cyanosis or edema  Lab Results: Recent Labs    07/24/20 2125 07/25/20 1044 07/25/20 1700 07/26/20 0514  WBC 7.2 7.1  --  7.6  HGB 6.0* 7.5* 9.2* 8.3*  HCT 20.3* 23.5* 28.8* 27.4*  PLT 281 307  --  292   BMET Recent Labs    07/24/20 2125 07/25/20 1044 07/26/20 0514  NA 136 135 133*  K 4.3 3.8 3.7  CL 107 104 101  CO2 20* 22 24  GLUCOSE 208* 151* 118*  BUN 32* 26* 28*  CREATININE 1.64* 1.57* 1.95*  CALCIUM 8.5* 8.3* 8.6*   LFT Recent Labs    07/24/20 2125  PROT 6.8  ALBUMIN 3.3*  AST 26  ALT 19  ALKPHOS 118  BILITOT 0.8   PT/INR No results for input(s): LABPROT, INR in the last 72 hours. Hepatitis Panel No results for input(s): HEPBSAG, HCVAB, HEPAIGM, HEPBIGM in the last 72 hours. C-Diff No results for input(s): CDIFFTOX in the last 72 hours. Fecal Lactopherrin No results for input(s): FECLLACTOFRN in the last 72 hours.  Studies/Results: DG Chest 2 View  Result Date: 07/24/2020 CLINICAL DATA:  Shortness of breath, lower extremity swelling, hyperglycemia EXAM: CHEST - 2 VIEW COMPARISON:  01/21/2018 FINDINGS: Diffuse hazy  interstitial opacities throughout the lungs with peripheral septal and fissural thickening. Bilateral pleural effusions. No pneumothorax. Congested pulmonary vascularity. Cardiac silhouette appears enlarged from comparison radiograph accounting for differences in technique. Remaining cardiomediastinal contours are unremarkable. No acute osseous or soft tissue abnormality. IMPRESSION: Findings consistent with CHF including cardiomegaly, vascular congestion, interstitial edema and bilateral pleural effusions. Electronically Signed   By: Lovena Le M.D.   On: 07/24/2020 19:11   ECHOCARDIOGRAM COMPLETE  Result Date: 07/25/2020    ECHOCARDIOGRAM REPORT   Patient Name:   Jeffrey Young Date of Exam: 07/25/2020 Medical Rec #:  [id number]      Height:       80.0 in Accession #:    [id number]     Weight:       227.1 lb Date of Birth:  09/22/1965      BSA:          2.427 m Patient Age:    55 years       BP:           156/93 mmHg Patient Gender: M              HR:           83 bpm. Exam Location:  Inpatient Procedure: 2D Echo, Cardiac Doppler and Color Doppler Indications:    I50.33 Acute on chronic diastolic (congestive) heart failure  History:        Patient has no prior history of Echocardiogram examinations.  Risk Factors:Diabetes.  Sonographer:    Jonelle Sidle Dance Referring Phys: [place]  1. Left ventricular ejection fraction, by estimation, is 55 to 60%. The left ventricle has normal function. The left ventricle has no regional wall motion abnormalities. There is mild left ventricular hypertrophy. Left ventricular diastolic parameters are indeterminate.  2. Right ventricular systolic function is normal. The right ventricular size is normal. Tricuspid regurgitation signal is inadequate for assessing PA pressure.  3. There is a trivial pericardial effusion anterior to the right ventricle.  4. The mitral valve is grossly normal. Mild mitral valve regurgitation.  5. The aortic valve is  tricuspid. Aortic valve regurgitation is not visualized.  6. The inferior vena cava is dilated in size with >50% respiratory variability, suggesting right atrial pressure of 8 mmHg. FINDINGS  Left Ventricle: Left ventricular ejection fraction, by estimation, is 55 to 60%. The left ventricle has normal function. The left ventricle has no regional wall motion abnormalities. The left ventricular internal cavity size was normal in size. There is  mild left ventricular hypertrophy. Left ventricular diastolic parameters are indeterminate. Right Ventricle: The right ventricular size is normal. No increase in right ventricular wall thickness. Right ventricular systolic function is normal. Tricuspid regurgitation signal is inadequate for assessing PA pressure. Left Atrium: Left atrial size was normal in size. Right Atrium: Right atrial size was normal in size. Pericardium: Trivial pericardial effusion is present. The pericardial effusion is anterior to the right ventricle. Mitral Valve: The mitral valve is grossly normal. Mild mitral valve regurgitation. Tricuspid Valve: The tricuspid valve is grossly normal. Tricuspid valve regurgitation is trivial. Aortic Valve: The aortic valve is tricuspid. Aortic valve regurgitation is not visualized. Pulmonic Valve: The pulmonic valve was grossly normal. Pulmonic valve regurgitation is trivial. Aorta: The aortic root is normal in size and structure. Venous: The inferior vena cava is dilated in size with greater than 50% respiratory variability, suggesting right atrial pressure of 8 mmHg. IAS/Shunts: The interatrial septum appears to be lipomatous. No atrial level shunt detected by color flow Doppler.  LEFT VENTRICLE PLAX 2D LVIDd:         3.90 cm  Diastology LVIDs:         2.80 cm  LV e' medial:    7.29 cm/s LV PW:         1.30 cm  LV E/e' medial:  18.1 LV IVS:        1.10 cm  LV e' lateral:   8.38 cm/s LVOT diam:     2.20 cm  LV E/e' lateral: 15.8 LV SV:         89 LV SV Index:   37  LVOT Area:     3.80 cm  RIGHT VENTRICLE             IVC RV Basal diam:  2.50 cm     IVC diam: 2.50 cm RV S prime:     13.70 cm/s TAPSE (M-mode): 2.6 cm LEFT ATRIUM             Index       RIGHT ATRIUM           Index LA diam:        4.40 cm 1.81 cm/m  RA Area:     13.50 cm LA Vol (A2C):   75.5 ml 31.11 ml/m RA Volume:   25.70 ml  10.59 ml/m LA Vol (A4C):   34.5 ml 14.21 ml/m LA Biplane Vol: 55.9 ml 23.03 ml/m  AORTIC VALVE LVOT Vmax:   126.00 cm/s LVOT Vmean:  80.700 cm/s LVOT VTI:    0.235 m  AORTA Ao Root diam: 3.50 cm Ao Asc diam:  3.30 cm MITRAL VALVE MV Area (PHT): 3.21 cm     SHUNTS MV Decel Time: 236 msec     Systemic VTI:  0.24 m MV E velocity: 132.00 cm/s  Systemic Diam: 2.20 cm MV A velocity: 114.00 cm/s MV E/A ratio:  1.16 Rozann Lesches MD Electronically signed by Rozann Lesches MD Signature Date/Time: 07/25/2020/11:07:06 AM    Final     Medications:  Scheduled: . carvedilol  3.125 mg Oral BID WC  . fluticasone  2 spray Each Nare Daily  . furosemide  40 mg Intravenous Q12H  . insulin aspart  0-9 Units Subcutaneous Q4H  . loratadine  10 mg Oral Daily  . sodium chloride flush  3 mL Intravenous Q12H   Continuous: . sodium chloride      Assessment/Plan: 1) Anemia. 2) Heme positive stool. 3) New onset CHF.   He is feeling much better today.  The diuresis and blood transfusion resolved his SOB.  He has a history of a large polyp in the left side of his colon.  The patient was referred to [facility] and he was in contact with him.  Plans were being made in 05/2018 for the procedure, but it was cancelled by [facility] as a result of the pandemic.  He reports reaching out to them a couple of other times, but they never rescheduled him.  A repeat colonoscopy is warranted at this time.  Plan: 1) Colonoscopy tomorrow.   LOS: 2 days   Arihant Pennings D 07/26/2020, 8:11 AM

## 2020-07-26 NOTE — Progress Notes (Signed)
PT Cancellation Note  Patient Details Name: Jeffrey Young MRN: [id number] DOB: 18-Jul-1965   Cancelled Treatment:    Reason Eval/Treat Not Completed: PT screened, no needs identified, will sign off (pt reports he's been able to ambulate full length of hallway without an assistive device and no loss of balance. He reports he's not having any difficulty with mobility. PT signing off as pt is independent with mobility. Encouraged pt to ambulate TID to minimize deconditioning during hospitalization.  Blondell Reveal Kistler PT 07/26/2020  Acute Rehabilitation Services Pager [phone] Office [phone]

## 2020-07-26 NOTE — Plan of Care (Signed)
  Problem: Health Behavior/Discharge Planning: Goal: Ability to manage health-related needs will improve Outcome: Progressing   Problem: Clinical Measurements: Goal: Ability to maintain clinical measurements within normal limits will improve Outcome: Progressing Goal: Will remain free from infection Outcome: Progressing Goal: Diagnostic test results will improve Outcome: Progressing Goal: Respiratory complications will improve Outcome: Progressing Goal: Cardiovascular complication will be avoided Outcome: Progressing   Problem: Activity: Goal: Risk for activity intolerance will decrease Outcome: Progressing   Problem: Nutrition: Goal: Adequate nutrition will be maintained Outcome: Progressing   Problem: Coping: Goal: Level of anxiety will decrease Outcome: Progressing   Problem: Elimination: Goal: Will not experience complications related to bowel motility Outcome: Progressing Goal: Will not experience complications related to urinary retention Outcome: Progressing   Problem: Pain Managment: Goal: General experience of comfort will improve Outcome: Progressing   Problem: Safety: Goal: Ability to remain free from injury will improve Outcome: Progressing   Problem: Skin Integrity: Goal: Risk for impaired skin integrity will decrease Outcome: Progressing   Problem: Education: Goal: Ability to demonstrate management of disease process will improve Outcome: Progressing Goal: Ability to verbalize understanding of medication therapies will improve Outcome: Progressing   Problem: Activity: Goal: Capacity to carry out activities will improve Outcome: Progressing   Problem: Cardiac: Goal: Ability to achieve and maintain adequate cardiopulmonary perfusion will improve Outcome: Progressing   

## 2020-07-26 NOTE — Progress Notes (Addendum)
PROGRESS NOTE    Jeffrey Young  MPN:[id number] DOB: [birth year] DOA: 07/24/2020 PCP: [name], MD    Chief Complaint  Patient presents with  . Hypoglycemia  . Shortness of Breath    Brief Narrative:  Pleasant 55 year old gentleman history of type 2 diabetes, history of left transmetatarsal amputation of the foot, history of colonic polyps including 30 mm polyp which was unable to be resected on colonoscopy January 2020.  Patient was referred to [facility] for resection however all elective procedures were canceled at that time due to the Covid pandemic.  Patient with no prior history of heart failure. Patient presented to the ED with complaints of lightheadedness noted to have a blood glucose level of 34, 5-day history of exertional dyspnea, worsening lower extremity edema, abdominal swelling, cough all which are new.  Patient seen in the ED noted to have systolic blood pressures in the 160s, chest x-ray consistent with pulmonary edema, elevated BNP, elevated creatinine of 1.6, hemoglobin of 6.0.  Patient denied any melanotic stools, hematochezia, hematemesis.  FOBT noted to be positive. 2 units of packed red blood cells ordered for transfusion. GI consulted.   Assessment & Plan:   Principal Problem:   New onset of congestive heart failure (HCC) Active Problems:   DM2 (diabetes mellitus, type 2) (HCC)   Anemia   GIB (gastrointestinal bleeding)   ARF (acute renal failure) (HCC)   Symptomatic anemia   Hypertension  1 new onset acute diastolic CHF Questionable etiology.  Patient with a history of type 2 diabetes, history of transmetatarsal amputation, noted to have elevated blood pressure on admission with new onset CHF with dyspnea on exertion, lower extremity edema. New onset CHF could partly be secondary to subacute on chronic GI bleed leading to significant anemia as patient presented with a hemoglobin of 6.0.   -Patient placed on IV Lasix with urine output of 4.175 L over the past  24 hours.   -Current weight of 225.31 pounds from 227 pounds from 220 pounds.   -Patient with symptomatic improvement.   -  EKG with normal sinus rhythm with Q waves in lead III.  Patient denies any chest pain.   -2D echo with EF of 55 to 60%, no wall motion abnormalities, mild LVH.   -Continue Coreg.  Placed on hydralazine for better blood pressure control.   -Unable to place on the ACE inhibitor at this time due to worsening renal function.  -Transfuse to keep hemoglobin > 8. -Check a fasting lipid panel. -Consult with cardiology for further evaluation and management.   2.  Symptomatic anemia/GI bleed Likely subacute on chronic slow GI bleed.  Patient denies any significant melanotic stools or hematochezia.  FOBT positive.  Hemoglobin noted to be 6.0 on admission.  Patient transfused total of 3 units packed red blood cells with hemoglobin currently 8.3.   -Patient noted to have had a 30 mm descending colonic polyp in January 2020 that was never removed as all elective surgeries were canceled in January 2020 due to Covid pandemic as patient was referred to [facility]. -Patient seen in consultation by GI and patient for colonoscopy tomorrow for further evaluation.   -Follow H&H.   -Transfusion threshold hemoglobin < 8.  -Per GI.  3.  Diabetes mellitus type 2 Patient noted to have had a hypoglycemic spell on admission with a CBG of 34.  Hemoglobin A1c 7.2.  CBG 124 this morning.  Continue to hold Lantus.   -Change CBGs to every 6 hours. -Patient to be n.p.o.  in anticipation of colonoscopy tomorrow. -SSI.  4.  Acute kidney injury Likely secondary to problem #1.  Last creatinine of approximately 1.26 (01/26/2018).  Urinalysis nitrite negative, leukocytes negative, 100 protein.  Patient diuresed with IV Lasix due to new onset CHF with urine output of having 5 L over the past 24 hours.  Worsening creatinine currently at 1.95.  Check a renal ultrasound.  Change IV Lasix to oral Lasix 40 mg p.o. twice  daily.  Monitor urine output.  Follow renal function  5.  Hypertension Continue Coreg.  Start hydralazine 25 mg p.o. 3 times daily for better blood pressure control.  Cardiology consulted.      DVT prophylaxis: SCDs Code Status: Full Family Communication: Updated patient.  No family at bedside. Disposition:   Status is: Inpatient    Dispo: The patient is from: Home              Anticipated d/c is to: Home              Patient status post transfusion 3 units packed red blood cells for anemia.  Anemia work-up underway.  Patient for colonoscopy tomorrow.  Patient with new onset CHF.  Cardiology consultation pending.  Not stable for discharge.    Difficult to place patient no       Consultants:   Gastroenterology: [name] 07/25/2020  Procedures:   Transfusion 3 units packed red blood cells 07/25/2020  2D echo 07/25/2020  Chest x-ray 07/24/2020    Antimicrobials:   None   Subjective: Patient laying in bed.  States shortness of breath has improved states ambulated in the hallway and did not feel winded nor was he is breathing labored.  Lower extremity edema improved.  Denies any chest pain.  No abdominal pain.  Stated had a bowel movement this morning with no bleeding.   States he spoke with GI today and they did not have any good news due to concerns for polyp noted via last colonoscopy that was unable to be assessed at [facility] due to COVID-19.    Objective: Vitals:   07/25/20 2112 07/26/20 0032 07/26/20 0416 07/26/20 0757  BP: 134/84  118/84 (!) 164/93  Pulse: 88  83 71  Resp:      Temp: 98.6 F (37 C)  97.9 F (36.6 C)   TempSrc: Oral  Axillary   SpO2: 100%  96%   Weight:  102.2 kg    Height:        Intake/Output Summary (Last 24 hours) at 07/26/2020 1455 Last data filed at 07/26/2020 1000 Gross per 24 hour  Intake 1457.17 ml  Output 2225 ml  Net -767.83 ml   Filed Weights   07/24/20 1734 07/25/20 0127 07/26/20 0032  Weight: 99.8 kg 103 kg 102.2 kg     Examination:  General exam: NAD Respiratory system: Clear to auscultation bilaterally.  No wheezes, no crackles, no rhonchi.  Normal respiratory effort.  Speaking in full sentences.  No use of accessory muscles of respiration.   Cardiovascular system: Regular rate rhythm no murmurs rubs or gallops.  No JVD.  Left lower extremity with no edema.  1+ right lower extremity edema.  Gastrointestinal system: Abdomen is soft, nontender, nondistended, positive bowel sounds.  No rebound.  No guarding.  Central nervous system: Alert and oriented. No focal neurological deficits. Extremities: Left transmetatarsal amputation foot.  1+ right lower extremity edema.  No edema left lower extremity.  Skin: No rashes, lesions or ulcers Psychiatry: Judgement and insight appear normal.  Mood & affect appropriate.     Data Reviewed: I have personally reviewed following labs and imaging studies  CBC: Recent Labs  Lab 07/24/20 2125 07/25/20 1044 07/25/20 1700 07/26/20 0514  WBC 7.2 7.1  --  7.6  NEUTROABS 4.7  --   --  4.5  HGB 6.0* 7.5* 9.2* 8.3*  HCT 20.3* 23.5* 28.8* 27.4*  MCV 86.4 86.4  --  90.1  PLT 281 307  --  509    Basic Metabolic Panel: Recent Labs  Lab 07/24/20 2125 07/25/20 1044 07/26/20 0514  NA 136 135 133*  K 4.3 3.8 3.7  CL 107 104 101  CO2 20* 22 24  GLUCOSE 208* 151* 118*  BUN 32* 26* 28*  CREATININE 1.64* 1.57* 1.95*  CALCIUM 8.5* 8.3* 8.6*    GFR: Estimated Creatinine Clearance: 52.5 mL/min (A) (by C-G formula based on SCr of 1.95 mg/dL (H)).  Liver Function Tests: Recent Labs  Lab 07/24/20 2125  AST 26  ALT 19  ALKPHOS 118  BILITOT 0.8  PROT 6.8  ALBUMIN 3.3*    CBG: Recent Labs  Lab 07/25/20 1941 07/25/20 2337 07/26/20 0412 07/26/20 0740 07/26/20 1158  GLUCAP 135* 162* 122* 124* 180*     Recent Results (from the past 240 hour(s))  Culture, Urine     Status: None   Collection Time: 07/25/20  9:31 AM   Specimen: Urine, Clean Catch  Result  Value Ref Range Status   Specimen Description   Final    URINE, CLEAN CATCH Performed at [facility], [place] [address] [place], [place] [address]    Special Requests   Final    URINE, RANDOM Performed at [facility] [address] [place], [place] [address]    Culture   Final    NO GROWTH Performed at [facility], [place] [address], [place], [place] [address]    Report Status 07/26/2020 FINAL  Final         Radiology Studies: DG Chest 2 View  Result Date: 07/24/2020 CLINICAL DATA:  Shortness of breath, lower extremity swelling, hyperglycemia EXAM: CHEST - 2 VIEW COMPARISON:  01/21/2018 FINDINGS: Diffuse hazy interstitial opacities throughout the lungs with peripheral septal and fissural thickening. Bilateral pleural effusions. No pneumothorax. Congested pulmonary vascularity. Cardiac silhouette appears enlarged from comparison radiograph accounting for differences in technique. Remaining cardiomediastinal contours are unremarkable. No acute osseous or soft tissue abnormality. IMPRESSION: Findings consistent with CHF including cardiomegaly, vascular congestion, interstitial edema and bilateral pleural effusions. Electronically Signed   By: Lovena Le M.D.   On: 07/24/2020 19:11   ECHOCARDIOGRAM COMPLETE  Result Date: 07/25/2020    ECHOCARDIOGRAM REPORT   Patient Name:   Jeffrey Young Date of Exam: 07/25/2020 Medical Rec #:  [id number]      Height:       80.0 in Accession #:    [id number]     Weight:       227.1 lb Date of Birth:  August 13, 1965      BSA:          2.427 m Patient Age:    74 years       BP:           156/93 mmHg Patient Gender: M              HR:           83 bpm. Exam Location:  Inpatient Procedure: 2D Echo, Cardiac Doppler and Color Doppler Indications:  I50.33 Acute on chronic diastolic (congestive) heart failure  History:        Patient has no prior history of Echocardiogram examinations.                 Risk Factors:Diabetes.   Sonographer:    Jonelle Sidle Dance Referring Phys: [place]  1. Left ventricular ejection fraction, by estimation, is 55 to 60%. The left ventricle has normal function. The left ventricle has no regional wall motion abnormalities. There is mild left ventricular hypertrophy. Left ventricular diastolic parameters are indeterminate.  2. Right ventricular systolic function is normal. The right ventricular size is normal. Tricuspid regurgitation signal is inadequate for assessing PA pressure.  3. There is a trivial pericardial effusion anterior to the right ventricle.  4. The mitral valve is grossly normal. Mild mitral valve regurgitation.  5. The aortic valve is tricuspid. Aortic valve regurgitation is not visualized.  6. The inferior vena cava is dilated in size with >50% respiratory variability, suggesting right atrial pressure of 8 mmHg. FINDINGS  Left Ventricle: Left ventricular ejection fraction, by estimation, is 55 to 60%. The left ventricle has normal function. The left ventricle has no regional wall motion abnormalities. The left ventricular internal cavity size was normal in size. There is  mild left ventricular hypertrophy. Left ventricular diastolic parameters are indeterminate. Right Ventricle: The right ventricular size is normal. No increase in right ventricular wall thickness. Right ventricular systolic function is normal. Tricuspid regurgitation signal is inadequate for assessing PA pressure. Left Atrium: Left atrial size was normal in size. Right Atrium: Right atrial size was normal in size. Pericardium: Trivial pericardial effusion is present. The pericardial effusion is anterior to the right ventricle. Mitral Valve: The mitral valve is grossly normal. Mild mitral valve regurgitation. Tricuspid Valve: The tricuspid valve is grossly normal. Tricuspid valve regurgitation is trivial. Aortic Valve: The aortic valve is tricuspid. Aortic valve regurgitation is not visualized. Pulmonic  Valve: The pulmonic valve was grossly normal. Pulmonic valve regurgitation is trivial. Aorta: The aortic root is normal in size and structure. Venous: The inferior vena cava is dilated in size with greater than 50% respiratory variability, suggesting right atrial pressure of 8 mmHg. IAS/Shunts: The interatrial septum appears to be lipomatous. No atrial level shunt detected by color flow Doppler.  LEFT VENTRICLE PLAX 2D LVIDd:         3.90 cm  Diastology LVIDs:         2.80 cm  LV e' medial:    7.29 cm/s LV PW:         1.30 cm  LV E/e' medial:  18.1 LV IVS:        1.10 cm  LV e' lateral:   8.38 cm/s LVOT diam:     2.20 cm  LV E/e' lateral: 15.8 LV SV:         89 LV SV Index:   37 LVOT Area:     3.80 cm  RIGHT VENTRICLE             IVC RV Basal diam:  2.50 cm     IVC diam: 2.50 cm RV S prime:     13.70 cm/s TAPSE (M-mode): 2.6 cm LEFT ATRIUM             Index       RIGHT ATRIUM           Index LA diam:        4.40 cm 1.81 cm/m  RA  Area:     13.50 cm LA Vol (A2C):   75.5 ml 31.11 ml/m RA Volume:   25.70 ml  10.59 ml/m LA Vol (A4C):   34.5 ml 14.21 ml/m LA Biplane Vol: 55.9 ml 23.03 ml/m  AORTIC VALVE LVOT Vmax:   126.00 cm/s LVOT Vmean:  80.700 cm/s LVOT VTI:    0.235 m  AORTA Ao Root diam: 3.50 cm Ao Asc diam:  3.30 cm MITRAL VALVE MV Area (PHT): 3.21 cm     SHUNTS MV Decel Time: 236 msec     Systemic VTI:  0.24 m MV E velocity: 132.00 cm/s  Systemic Diam: 2.20 cm MV A velocity: 114.00 cm/s MV E/A ratio:  1.16 Rozann Lesches MD Electronically signed by Rozann Lesches MD Signature Date/Time: 07/25/2020/11:07:06 AM    Final         Scheduled Meds: . carvedilol  3.125 mg Oral BID WC  . fluticasone  2 spray Each Nare Daily  . hydrALAZINE  25 mg Oral Q8H  . insulin aspart  0-9 Units Subcutaneous Q4H  . loratadine  10 mg Oral Daily  . polyethylene glycol-electrolytes  4,000 mL Oral Once  . sodium chloride flush  3 mL Intravenous Q12H   Continuous Infusions: . sodium chloride       LOS: 2 days     Time spent: 40 minutes     Irine Seal, MD [facility]   To contact the attending provider between 7A-7P or the covering provider during after hours 7P-7A, please log into the web site [email or web address] and access using universal  password for that web site. If you do not have the password, please call the hospital operator.  07/26/2020, 2:55 PM

## 2020-07-26 NOTE — Consult Note (Addendum)
Cardiology Consultation:   Patient ID: Jeffrey Young MRN: [id number]; DOB: 05/05/1966  Admit date: 07/24/2020 Date of Consult: 07/26/2020  PCP:  [name], MD   [place]  Cardiologist:  Skeet Latch, MD new [id number]    Patient Profile:   Jeffrey Young is a 55 y.o. male with a hx of DM2, transmetatarsal amputation (2019), chronic back pain from active duty injury who is being seen today for the evaluation of CHF at the request of Dr. Grandville Silos.  History of Present Illness:   Jeffrey Young has no prior cardiology history. He presented to [facility] with lightheadedness, exertional dyspnea, BL edema, abdominal distention, and cough.   Hemoccult positive Hb 6.0 --> 9.2 --> 8.3 sCr 1.64 --> 1.95 BNP 210 CXR with vascular congestions, edema, and bilateral pleural effusions A1c 7.2%  GI was consulted for positive hemoccult and Hb 6. He has a history of large polyp that has not been removed. He has been transfused 2 U PRBC. GI plans for colonoscopy tomorrow.  Cardiology was asked to evaluate for CHF and worsening renal function. Echo yesterday showed preserved EF 55-60%, mild LVH, and mild MR.   He has received IV lasix: 60 mg --> 40 mg + 20 mg --> 40 mg. He received 40 mg PO lasix this morning.  He reports symptoms started about 1 month ago and worsened over the last week. He reports orthopnea, PND, lower extremity swelling, and dyspnea on exertion. He slept last evening with the head of bed raised, but does report improvement in his dyspnea. His main complaint is frustration with multiple blood draws and lack of sleep. The "insomnia" he refers to prior to admission sounds consistent with PND.   He is married and has 7 grandchildren. 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 He is independent with ADLs at home. He denies history of PCI, MI, and stroke.    Past Medical History:  Diagnosis Date  . Diabetes mellitus without  complication ([place])    type 2    Past Surgical History:  Procedure Laterality Date  . AMPUTATION Left 01/22/2018   Procedure: TRANSMETATARSAL AMPUTATION;  Surgeon: Newt Minion, MD;  Location: [place];  Service: Orthopedics;  Laterality: Left;toes  . COLONOSCOPY WITH PROPOFOL N/A 05/24/2018   Procedure: COLONOSCOPY WITH PROPOFOL;  Surgeon: Carol Ada, MD;  Location: [facility];  Service: Endoscopy;  Laterality: N/A;  . POLYPECTOMY  05/24/2018   Procedure: POLYPECTOMY;  Surgeon: Carol Ada, MD;  Location: [facility];  Service: Endoscopy;;     Home Medications:  Prior to Admission medications   Medication Sig Start Date End Date Taking? Authorizing Provider  atorvastatin (LIPITOR) 10 MG tablet Take 10 mg by mouth daily. 05/08/19  Yes [provider]  Insulin Glargine-Lixisenatide 100-33 UNT-MCG/ML SOPN Inject 10-15 Units into the skin See admin instructions. Inject 10u under the skin in the morning and 15u under the in the evening 06/10/20  Yes [provider]    Inpatient Medications: Scheduled Meds: . carvedilol  3.125 mg Oral BID WC  . fluticasone  2 spray Each Nare Daily  . furosemide  40 mg Oral BID  . hydrALAZINE  25 mg Oral Q8H  . insulin aspart  0-9 Units Subcutaneous Q4H  . loratadine  10 mg Oral Daily  . polyethylene glycol-electrolytes  4,000 mL Oral Once  . sodium chloride flush  3 mL Intravenous Q12H   Continuous Infusions: . sodium chloride  PRN Meds: sodium chloride, acetaminophen, labetalol, ondansetron (ZOFRAN) IV, sodium chloride flush  Allergies:    Allergies  Allergen Reactions  . Bee Venom Swelling    Cold Sweats  . Latex Rash    Social History:   Social History   Socioeconomic History  . Marital status: Married    Spouse name: Not on file  . Number of children: Not on file  . Years of education: Not on file  . Highest education level: Not on file  Occupational History  . Not on file  Tobacco Use  . Smoking status:  Never Smoker  . Smokeless tobacco: Never Used  Vaping Use  . Vaping Use: Never used  Substance and Sexual Activity  . Alcohol use: Yes    Alcohol/week: 1.0 standard drink    Types: 1 Cans of beer per week    Comment: occasional  . Drug use: Never  . Sexual activity: Yes  Other Topics Concern  . Not on file  Social History Narrative  . Not on file   Social Determinants of Health   Financial Resource Strain: Not on file  Food Insecurity: Not on file  Transportation Needs: Not on file  Physical Activity: Not on file  Stress: Not on file  Social Connections: Not on file  Intimate Partner Violence: Not on file    Family History:    Family History  Problem Relation Age of Onset  . Hypertension Father      ROS:  Please see the history of present illness.   All other ROS reviewed and negative.     Physical Exam/Data:   Vitals:   07/25/20 2112 07/26/20 0032 07/26/20 0416 07/26/20 0757  BP: 134/84  118/84 (!) 164/93  Pulse: 88  83 71  Resp:      Temp: 98.6 F (37 C)  97.9 F (36.6 C)   TempSrc: Oral  Axillary   SpO2: 100%  96%   Weight:  102.2 kg    Height:        Intake/Output Summary (Last 24 hours) at 07/26/2020 0943 Last data filed at 07/26/2020 0824 Gross per 24 hour  Intake 2174.17 ml  Output 4425 ml  Net -2250.83 ml   Last 3 Weights 07/26/2020 07/25/2020 07/24/2020  Weight (lbs) 225 lb 5 oz 227 lb 1.6 oz 220 lb  Weight (kg) 102.2 kg 103.012 kg 99.791 kg     Body mass index is 29.73 kg/m.  General:  Obese male in NAD HEENT: normal Neck: no JVD Vascular: No carotid bruits Cardiac:  normal S1, S2; RRR; no murmur  Lungs:  Unlabored, diminished in right base Abd: soft, nontender, no hepatomegaly  Ext: B LE edema R > L Skin: warm and dry  Neuro:  CNs 2-12 intact, no focal abnormalities noted Psych:  Normal affect   EKG:  The EKG was personally reviewed and demonstrates:   Sinus rhythm HR 91 Telemetry:  Telemetry was personally reviewed and demonstrates:   Sinus rhythm in the 70-80s  Relevant CV Studies:  Echo 07/25/20: 1. Left ventricular ejection fraction, by estimation, is 55 to 60%. The  left ventricle has normal function. The left ventricle has no regional  wall motion abnormalities. There is mild left ventricular hypertrophy.  Left ventricular diastolic parameters  are indeterminate.  2. Right ventricular systolic function is normal. The right ventricular  size is normal. Tricuspid regurgitation signal is inadequate for assessing  PA pressure.  3. There is a trivial pericardial effusion anterior to the right  ventricle.  4. The mitral valve is grossly normal. Mild mitral valve regurgitation.  5. The aortic valve is tricuspid. Aortic valve regurgitation is not  visualized.  6. The inferior vena cava is dilated in size with >50% respiratory  variability, suggesting right atrial pressure of 8 mmHg.   Laboratory Data:  High Sensitivity Troponin:  No results for input(s): TROPONINIHS in the last 720 hours.   Chemistry Recent Labs  Lab 07/24/20 2125 07/25/20 1044 07/26/20 0514  NA 136 135 133*  K 4.3 3.8 3.7  CL 107 104 101  CO2 20* 22 24  GLUCOSE 208* 151* 118*  BUN 32* 26* 28*  CREATININE 1.64* 1.57* 1.95*  CALCIUM 8.5* 8.3* 8.6*  GFRNONAA 48* 51* 39*  ANIONGAP 9 9 8     Recent Labs  Lab 07/24/20 2125  PROT 6.8  ALBUMIN 3.3*  AST 26  ALT 19  ALKPHOS 118  BILITOT 0.8   Hematology Recent Labs  Lab 07/24/20 2125 07/25/20 1044 07/25/20 1700 07/26/20 0514  WBC 7.2 7.1  --  7.6  RBC 2.35* 2.72*  --  3.04*  HGB 6.0* 7.5* 9.2* 8.3*  HCT 20.3* 23.5* 28.8* 27.4*  MCV 86.4 86.4  --  90.1  MCH 25.5* 27.6  --  27.3  MCHC 29.6* 31.9  --  30.3  RDW 15.8* 15.0  --  15.1  PLT 281 307  --  292   BNP Recent Labs  Lab 07/24/20 2125  BNP 210.2*    DDimer No results for input(s): DDIMER in the last 168 hours.   Radiology/Studies:  DG Chest 2 View  Result Date: 07/24/2020 CLINICAL DATA:  Shortness of breath,  lower extremity swelling, hyperglycemia EXAM: CHEST - 2 VIEW COMPARISON:  01/21/2018 FINDINGS: Diffuse hazy interstitial opacities throughout the lungs with peripheral septal and fissural thickening. Bilateral pleural effusions. No pneumothorax. Congested pulmonary vascularity. Cardiac silhouette appears enlarged from comparison radiograph accounting for differences in technique. Remaining cardiomediastinal contours are unremarkable. No acute osseous or soft tissue abnormality. IMPRESSION: Findings consistent with CHF including cardiomegaly, vascular congestion, interstitial edema and bilateral pleural effusions. Electronically Signed   By: Lovena Le M.D.   On: 07/24/2020 19:11   ECHOCARDIOGRAM COMPLETE  Result Date: 07/25/2020    ECHOCARDIOGRAM REPORT   Patient Name:   Jeffrey Young Date of Exam: 07/25/2020 Medical Rec #:  [id number]      Height:       80.0 in Accession #:    [id number]     Weight:       227.1 lb Date of Birth:  1965-09-11      BSA:          2.427 m Patient Age:    40 years       BP:           156/93 mmHg Patient Gender: M              HR:           83 bpm. Exam Location:  Inpatient Procedure: 2D Echo, Cardiac Doppler and Color Doppler Indications:    I50.33 Acute on chronic diastolic (congestive) heart failure  History:        Patient has no prior history of Echocardiogram examinations.                 Risk Factors:Diabetes.  Sonographer:    Jonelle Sidle Dance Referring Phys: [place]  1. Left ventricular ejection fraction, by estimation, is 55 to 60%. The left  ventricle has normal function. The left ventricle has no regional wall motion abnormalities. There is mild left ventricular hypertrophy. Left ventricular diastolic parameters are indeterminate.  2. Right ventricular systolic function is normal. The right ventricular size is normal. Tricuspid regurgitation signal is inadequate for assessing PA pressure.  3. There is a trivial pericardial effusion anterior to the right  ventricle.  4. The mitral valve is grossly normal. Mild mitral valve regurgitation.  5. The aortic valve is tricuspid. Aortic valve regurgitation is not visualized.  6. The inferior vena cava is dilated in size with >50% respiratory variability, suggesting right atrial pressure of 8 mmHg. FINDINGS  Left Ventricle: Left ventricular ejection fraction, by estimation, is 55 to 60%. The left ventricle has normal function. The left ventricle has no regional wall motion abnormalities. The left ventricular internal cavity size was normal in size. There is  mild left ventricular hypertrophy. Left ventricular diastolic parameters are indeterminate. Right Ventricle: The right ventricular size is normal. No increase in right ventricular wall thickness. Right ventricular systolic function is normal. Tricuspid regurgitation signal is inadequate for assessing PA pressure. Left Atrium: Left atrial size was normal in size. Right Atrium: Right atrial size was normal in size. Pericardium: Trivial pericardial effusion is present. The pericardial effusion is anterior to the right ventricle. Mitral Valve: The mitral valve is grossly normal. Mild mitral valve regurgitation. Tricuspid Valve: The tricuspid valve is grossly normal. Tricuspid valve regurgitation is trivial. Aortic Valve: The aortic valve is tricuspid. Aortic valve regurgitation is not visualized. Pulmonic Valve: The pulmonic valve was grossly normal. Pulmonic valve regurgitation is trivial. Aorta: The aortic root is normal in size and structure. Venous: The inferior vena cava is dilated in size with greater than 50% respiratory variability, suggesting right atrial pressure of 8 mmHg. IAS/Shunts: The interatrial septum appears to be lipomatous. No atrial level shunt detected by color flow Doppler.  LEFT VENTRICLE PLAX 2D LVIDd:         3.90 cm  Diastology LVIDs:         2.80 cm  LV e' medial:    7.29 cm/s LV PW:         1.30 cm  LV E/e' medial:  18.1 LV IVS:        1.10 cm  LV  e' lateral:   8.38 cm/s LVOT diam:     2.20 cm  LV E/e' lateral: 15.8 LV SV:         89 LV SV Index:   37 LVOT Area:     3.80 cm  RIGHT VENTRICLE             IVC RV Basal diam:  2.50 cm     IVC diam: 2.50 cm RV S prime:     13.70 cm/s TAPSE (M-mode): 2.6 cm LEFT ATRIUM             Index       RIGHT ATRIUM           Index LA diam:        4.40 cm 1.81 cm/m  RA Area:     13.50 cm LA Vol (A2C):   75.5 ml 31.11 ml/m RA Volume:   25.70 ml  10.59 ml/m LA Vol (A4C):   34.5 ml 14.21 ml/m LA Biplane Vol: 55.9 ml 23.03 ml/m  AORTIC VALVE LVOT Vmax:   126.00 cm/s LVOT Vmean:  80.700 cm/s LVOT VTI:    0.235 m  AORTA Ao Root diam: 3.50 cm Ao Asc  diam:  3.30 cm MITRAL VALVE MV Area (PHT): 3.21 cm     SHUNTS MV Decel Time: 236 msec     Systemic VTI:  0.24 m MV E velocity: 132.00 cm/s  Systemic Diam: 2.20 cm MV A velocity: 114.00 cm/s MV E/A ratio:  1.16 Rozann Lesches MD Electronically signed by Rozann Lesches MD Signature Date/Time: 07/25/2020/11:07:06 AM    Final      Assessment and Plan:   New onset diastolic heart failure - BNP mildly elevated to 210 - CXR with congestion and edema, bilateral pleural effusions - he has received 60 mg IV x 2, 40 mg IV lasix  --> now on 40 mg PO lasix BID - he has had good diuresis - charted 4L urine output yesterday, was nearly 2L the day before - weight is 225 lbs from 227 lbs - significant improvement in breathing and lower extremity edema - continue PO lasix - consider holding PM dose as he will have a GI prep for colonoscopy tomorrow   AKI - sCr continues to rise - suspect anemia may be playing a role  - lasix switched to PO dosing - consider holding this evening's dose of lasix - trend daily BMP   Anemia GI bleed - Hb trended down to 6.0 --> transfused 2U --> 1 U - Hb now 8.3  - GI consulted and planning for colonoscopy tomorrow   Hypertension - on hydralazine 25 mg TID, coreg 3.125 mg BID   Hyperlipidemia  - on 10 mg lipitor   DM2 - A1c  7.2% - SSI, per primary - could consider SGLT2i    Risk Assessment/Risk Scores:    New York Heart Association (NYHA) Functional Class NYHA Class II        For questions or updates, please contact [facility] Please consult [email or web address] for contact info under    Signed, Ledora Bottcher, PA  07/26/2020 9:43 AM

## 2020-07-26 NOTE — H&P (View-Only) (Signed)
Subjective: Feeling better.  He was able to ambulate yesterday without difficulty after his blood transfusion.  Objective: Vital signs in last 24 hours: Temp:  [97.7 F (36.5 C)-98.6 F (37 C)] 97.9 F (36.6 C) (03/07 0416) Pulse Rate:  [71-88] 71 (03/07 0757) BP: (118-174)/(84-102) 164/93 (03/07 0757) SpO2:  [92 %-100 %] 96 % (03/07 0416) Weight:  [102.2 kg] 102.2 kg (03/07 0032) Last BM Date: 07/25/20  Intake/Output from previous day: 03/06 0701 - 03/07 0700 In: 2776.7 [P.O.:2200; I.V.:19.2; Blood:557.5] Out: 2376 [Urine:4175] Intake/Output this shift: No intake/output data recorded.  General appearance: alert and no distress Resp: clear to auscultation bilaterally Cardio: regular rate and rhythm GI: soft, non-tender; bowel sounds normal; no masses,  no organomegaly Extremities: extremities normal, atraumatic, no cyanosis or edema  Lab Results: Recent Labs    07/24/20 2125 07/25/20 1044 07/25/20 1700 07/26/20 0514  WBC 7.2 7.1  --  7.6  HGB 6.0* 7.5* 9.2* 8.3*  HCT 20.3* 23.5* 28.8* 27.4*  PLT 281 307  --  292   BMET Recent Labs    07/24/20 2125 07/25/20 1044 07/26/20 0514  NA 136 135 133*  K 4.3 3.8 3.7  CL 107 104 101  CO2 20* 22 24  GLUCOSE 208* 151* 118*  BUN 32* 26* 28*  CREATININE 1.64* 1.57* 1.95*  CALCIUM 8.5* 8.3* 8.6*   LFT Recent Labs    07/24/20 2125  PROT 6.8  ALBUMIN 3.3*  AST 26  ALT 19  ALKPHOS 118  BILITOT 0.8   PT/INR No results for input(s): LABPROT, INR in the last 72 hours. Hepatitis Panel No results for input(s): HEPBSAG, HCVAB, HEPAIGM, HEPBIGM in the last 72 hours. C-Diff No results for input(s): CDIFFTOX in the last 72 hours. Fecal Lactopherrin No results for input(s): FECLLACTOFRN in the last 72 hours.  Studies/Results: DG Chest 2 View  Result Date: 07/24/2020 CLINICAL DATA:  Shortness of breath, lower extremity swelling, hyperglycemia EXAM: CHEST - 2 VIEW COMPARISON:  01/21/2018 FINDINGS: Diffuse hazy  interstitial opacities throughout the lungs with peripheral septal and fissural thickening. Bilateral pleural effusions. No pneumothorax. Congested pulmonary vascularity. Cardiac silhouette appears enlarged from comparison radiograph accounting for differences in technique. Remaining cardiomediastinal contours are unremarkable. No acute osseous or soft tissue abnormality. IMPRESSION: Findings consistent with CHF including cardiomegaly, vascular congestion, interstitial edema and bilateral pleural effusions. Electronically Signed   By: Lovena Le M.D.   On: 07/24/2020 19:11   ECHOCARDIOGRAM COMPLETE  Result Date: 07/25/2020    ECHOCARDIOGRAM REPORT   Patient Name:   Jeffrey Young Date of Exam: 07/25/2020 Medical Rec #:  [id number]      Height:       80.0 in Accession #:    [id number]     Weight:       227.1 lb Date of Birth:  [birth year]      BSA:          2.427 m Patient Age:    55 years       BP:           156/93 mmHg Patient Gender: M              HR:           83 bpm. Exam Location:  Inpatient Procedure: 2D Echo, Cardiac Doppler and Color Doppler Indications:    I50.33 Acute on chronic diastolic (congestive) heart failure  History:        Patient has no prior history of Echocardiogram examinations.  Risk Factors:Diabetes.  Sonographer:    Jonelle Sidle Dance Referring Phys: [place]  1. Left ventricular ejection fraction, by estimation, is 55 to 60%. The left ventricle has normal function. The left ventricle has no regional wall motion abnormalities. There is mild left ventricular hypertrophy. Left ventricular diastolic parameters are indeterminate.  2. Right ventricular systolic function is normal. The right ventricular size is normal. Tricuspid regurgitation signal is inadequate for assessing PA pressure.  3. There is a trivial pericardial effusion anterior to the right ventricle.  4. The mitral valve is grossly normal. Mild mitral valve regurgitation.  5. The aortic valve is  tricuspid. Aortic valve regurgitation is not visualized.  6. The inferior vena cava is dilated in size with >50% respiratory variability, suggesting right atrial pressure of 8 mmHg. FINDINGS  Left Ventricle: Left ventricular ejection fraction, by estimation, is 55 to 60%. The left ventricle has normal function. The left ventricle has no regional wall motion abnormalities. The left ventricular internal cavity size was normal in size. There is  mild left ventricular hypertrophy. Left ventricular diastolic parameters are indeterminate. Right Ventricle: The right ventricular size is normal. No increase in right ventricular wall thickness. Right ventricular systolic function is normal. Tricuspid regurgitation signal is inadequate for assessing PA pressure. Left Atrium: Left atrial size was normal in size. Right Atrium: Right atrial size was normal in size. Pericardium: Trivial pericardial effusion is present. The pericardial effusion is anterior to the right ventricle. Mitral Valve: The mitral valve is grossly normal. Mild mitral valve regurgitation. Tricuspid Valve: The tricuspid valve is grossly normal. Tricuspid valve regurgitation is trivial. Aortic Valve: The aortic valve is tricuspid. Aortic valve regurgitation is not visualized. Pulmonic Valve: The pulmonic valve was grossly normal. Pulmonic valve regurgitation is trivial. Aorta: The aortic root is normal in size and structure. Venous: The inferior vena cava is dilated in size with greater than 50% respiratory variability, suggesting right atrial pressure of 8 mmHg. IAS/Shunts: The interatrial septum appears to be lipomatous. No atrial level shunt detected by color flow Doppler.  LEFT VENTRICLE PLAX 2D LVIDd:         3.90 cm  Diastology LVIDs:         2.80 cm  LV e' medial:    7.29 cm/s LV PW:         1.30 cm  LV E/e' medial:  18.1 LV IVS:        1.10 cm  LV e' lateral:   8.38 cm/s LVOT diam:     2.20 cm  LV E/e' lateral: 15.8 LV SV:         89 LV SV Index:   37  LVOT Area:     3.80 cm  RIGHT VENTRICLE             IVC RV Basal diam:  2.50 cm     IVC diam: 2.50 cm RV S prime:     13.70 cm/s TAPSE (M-mode): 2.6 cm LEFT ATRIUM             Index       RIGHT ATRIUM           Index LA diam:        4.40 cm 1.81 cm/m  RA Area:     13.50 cm LA Vol (A2C):   75.5 ml 31.11 ml/m RA Volume:   25.70 ml  10.59 ml/m LA Vol (A4C):   34.5 ml 14.21 ml/m LA Biplane Vol: 55.9 ml 23.03 ml/m  AORTIC VALVE LVOT Vmax:   126.00 cm/s LVOT Vmean:  80.700 cm/s LVOT VTI:    0.235 m  AORTA Ao Root diam: 3.50 cm Ao Asc diam:  3.30 cm MITRAL VALVE MV Area (PHT): 3.21 cm     SHUNTS MV Decel Time: 236 msec     Systemic VTI:  0.24 m MV E velocity: 132.00 cm/s  Systemic Diam: 2.20 cm MV A velocity: 114.00 cm/s MV E/A ratio:  1.16 Rozann Lesches MD Electronically signed by Rozann Lesches MD Signature Date/Time: 07/25/2020/11:07:06 AM    Final     Medications:  Scheduled: . carvedilol  3.125 mg Oral BID WC  . fluticasone  2 spray Each Nare Daily  . furosemide  40 mg Intravenous Q12H  . insulin aspart  0-9 Units Subcutaneous Q4H  . loratadine  10 mg Oral Daily  . sodium chloride flush  3 mL Intravenous Q12H   Continuous: . sodium chloride      Assessment/Plan: 1) Anemia. 2) Heme positive stool. 3) New onset CHF.   He is feeling much better today.  The diuresis and blood transfusion resolved his SOB.  He has a history of a large polyp in the left side of his colon.  The patient was referred to [facility] and he was in contact with him.  Plans were being made in 05/2018 for the procedure, but it was cancelled by [facility] as a result of the pandemic.  He reports reaching out to them a couple of other times, but they never rescheduled him.  A repeat colonoscopy is warranted at this time.  Plan: 1) Colonoscopy tomorrow.   LOS: 2 days   Lyndall Windt D 07/26/2020, 8:11 AM

## 2020-07-27 ENCOUNTER — Encounter (HOSPITAL_COMMUNITY): Payer: Self-pay | Admitting: Internal Medicine

## 2020-07-27 ENCOUNTER — Encounter (HOSPITAL_COMMUNITY)
Admission: EM | Disposition: A | Discharge status: Home / Self Care | Payer: Self-pay | Source: Home / Self Care | Attending: Internal Medicine

## 2020-07-27 ENCOUNTER — Inpatient Hospital Stay (HOSPITAL_COMMUNITY): Payer: 59 | Admitting: Certified Registered"

## 2020-07-27 ENCOUNTER — Inpatient Hospital Stay (HOSPITAL_COMMUNITY): Payer: 59

## 2020-07-27 DIAGNOSIS — I5031 Acute diastolic (congestive) heart failure: Secondary | ICD-10-CM

## 2020-07-27 HISTORY — PX: SUBMUCOSAL TATTOO INJECTION: SHX6856

## 2020-07-27 HISTORY — PX: BIOPSY: SHX5522

## 2020-07-27 HISTORY — PX: COLONOSCOPY WITH PROPOFOL: SHX5780

## 2020-07-27 LAB — CBC WITH DIFFERENTIAL/PLATELET
Abs Immature Granulocytes: 0.03 10*3/uL (ref 0.00–0.07)
Basophils Absolute: 0 10*3/uL (ref 0.0–0.1)
Basophils Relative: 1 %
Eosinophils Absolute: 0.2 10*3/uL (ref 0.0–0.5)
Eosinophils Relative: 3 %
HCT: 27 % — ABNORMAL LOW (ref 39.0–52.0)
Hemoglobin: 8.7 g/dL — ABNORMAL LOW (ref 13.0–17.0)
Immature Granulocytes: 0 %
Lymphocytes Relative: 23 %
Lymphs Abs: 1.8 10*3/uL (ref 0.7–4.0)
MCH: 27.7 pg (ref 26.0–34.0)
MCHC: 32.2 g/dL (ref 30.0–36.0)
MCV: 86 fL (ref 80.0–100.0)
Monocytes Absolute: 1 10*3/uL (ref 0.1–1.0)
Monocytes Relative: 13 %
Neutro Abs: 4.7 10*3/uL (ref 1.7–7.7)
Neutrophils Relative %: 60 %
Platelets: 297 10*3/uL (ref 150–400)
RBC: 3.14 MIL/uL — ABNORMAL LOW (ref 4.22–5.81)
RDW: 14.7 % (ref 11.5–15.5)
WBC: 7.7 10*3/uL (ref 4.0–10.5)
nRBC: 0 % (ref 0.0–0.2)

## 2020-07-27 LAB — LIPID PANEL
Cholesterol: 152 mg/dL (ref 0–200)
HDL: 36 mg/dL — ABNORMAL LOW (ref >40)
LDL Cholesterol: 94 mg/dL (ref 0–99)
Total CHOL/HDL Ratio: 4.2 RATIO
Triglycerides: 109 mg/dL (ref <150)
VLDL: 22 mg/dL (ref 0–40)

## 2020-07-27 LAB — BASIC METABOLIC PANEL
Anion gap: 7 (ref 5–15)
BUN: 26 mg/dL — ABNORMAL HIGH (ref 6–20)
CO2: 22 mmol/L (ref 22–32)
Calcium: 8.4 mg/dL — ABNORMAL LOW (ref 8.9–10.3)
Chloride: 103 mmol/L (ref 98–111)
Creatinine, Ser: 1.65 mg/dL — ABNORMAL HIGH (ref 0.61–1.24)
GFR, Estimated: 48 mL/min — ABNORMAL LOW (ref >60)
Glucose, Bld: 116 mg/dL — ABNORMAL HIGH (ref 70–99)
Potassium: 3.9 mmol/L (ref 3.5–5.1)
Sodium: 132 mmol/L — ABNORMAL LOW (ref 135–145)

## 2020-07-27 LAB — HEMOGLOBIN AND HEMATOCRIT, BLOOD
HCT: 29.3 % — ABNORMAL LOW (ref 39.0–52.0)
Hemoglobin: 9.3 g/dL — ABNORMAL LOW (ref 13.0–17.0)

## 2020-07-27 LAB — MAGNESIUM: Magnesium: 1.9 mg/dL (ref 1.7–2.4)

## 2020-07-27 LAB — GLUCOSE, CAPILLARY
Glucose-Capillary: 110 mg/dL — ABNORMAL HIGH (ref 70–99)
Glucose-Capillary: 113 mg/dL — ABNORMAL HIGH (ref 70–99)
Glucose-Capillary: 120 mg/dL — ABNORMAL HIGH (ref 70–99)
Glucose-Capillary: 122 mg/dL — ABNORMAL HIGH (ref 70–99)
Glucose-Capillary: 124 mg/dL — ABNORMAL HIGH (ref 70–99)
Glucose-Capillary: 177 mg/dL — ABNORMAL HIGH (ref 70–99)
Glucose-Capillary: 181 mg/dL — ABNORMAL HIGH (ref 70–99)

## 2020-07-27 SURGERY — COLONOSCOPY WITH PROPOFOL
Anesthesia: Monitor Anesthesia Care

## 2020-07-27 MED ORDER — PROPOFOL 10 MG/ML IV BOLUS
INTRAVENOUS | Status: DC | PRN
  Administered 2020-07-27 (×2): 30 mg via INTRAVENOUS

## 2020-07-27 MED ORDER — PROPOFOL 500 MG/50ML IV EMUL
INTRAVENOUS | Status: DC | PRN
  Administered 2020-07-27: 125 ug/kg/min via INTRAVENOUS

## 2020-07-27 MED ORDER — IOHEXOL 9 MG/ML PO SOLN
1000.0000 mL | ORAL | Status: AC
  Administered 2020-07-27: 1000 mL via ORAL

## 2020-07-27 MED ORDER — SPOT INK MARKER SYRINGE KIT
PACK | SUBMUCOSAL | Status: DC | PRN
  Administered 2020-07-27: 4 mL via SUBMUCOSAL

## 2020-07-27 MED ORDER — DEXTROSE-NACL 5-0.9 % IV SOLN: INTRAVENOUS | Status: DC

## 2020-07-27 MED ORDER — PROPOFOL 500 MG/50ML IV EMUL
INTRAVENOUS | Status: AC
  Filled 2020-07-27: qty 50

## 2020-07-27 MED ORDER — LIDOCAINE 2% (20 MG/ML) 5 ML SYRINGE
INTRAMUSCULAR | Status: DC | PRN
  Administered 2020-07-27: 80 mg via INTRAVENOUS

## 2020-07-27 MED ORDER — IOHEXOL 300 MG/ML  SOLN
100.0000 mL | Freq: Once | INTRAMUSCULAR | Status: AC | PRN
  Administered 2020-07-27: 100 mL via INTRAVENOUS

## 2020-07-27 MED ORDER — LACTATED RINGERS IV SOLN: INTRAVENOUS | Status: DC | PRN

## 2020-07-27 SURGICAL SUPPLY — 22 items

## 2020-07-27 NOTE — Anesthesia Postprocedure Evaluation (Signed)
Anesthesia Post Note  Patient: Jeffrey Young  Procedure(s) Performed: COLONOSCOPY WITH PROPOFOL (N/A ) BIOPSY     Patient location during evaluation: Endoscopy Anesthesia Type: MAC Level of consciousness: awake and alert, patient cooperative and oriented Pain management: pain level controlled Vital Signs Assessment: post-procedure vital signs reviewed and stable Respiratory status: spontaneous breathing, nonlabored ventilation and respiratory function stable Cardiovascular status: blood pressure returned to baseline and stable Postop Assessment: no apparent nausea or vomiting, patient able to bend at knees and able to ambulate Anesthetic complications: no   No complications documented.  Last Vitals:  Vitals:   07/27/20 1411 07/27/20 1420  BP: (!) 108/48 (!) 115/51  Pulse: 75 67  Resp: 14 16  Temp: (P) 37 C   SpO2: (P) 93% 100%    Last Pain:  Vitals:   07/27/20 1411  TempSrc: (P) Oral  PainSc: (P) 0-No pain                 See [place],Jeffrey Young

## 2020-07-27 NOTE — Op Note (Signed)
[facility] Patient Name: Jeffrey Young Procedure Date: 07/27/2020 MRN: [id number] Attending MD: Carol Ada , MD Date of Birth: November 15, 1965 CSN: [id number] Age: 55 Admit Type: Inpatient Procedure:                Colonoscopy Indications:              Heme positive stool, Iron deficiency anemia Providers:                Carol Ada, MD, Carmie End, RN, Tyna Jaksch Technician Referring MD:              Medicines:                Propofol per Anesthesia Complications:            No immediate complications. Estimated Blood Loss:     Estimated blood loss was minimal. Procedure:                Pre-Anesthesia Assessment:                           - Prior to the procedure, a History and Physical                            was performed, and patient medications and                            allergies were reviewed. The patient's tolerance of                            previous anesthesia was also reviewed. The risks                            and benefits of the procedure and the sedation                            options and risks were discussed with the patient.                            All questions were answered, and informed consent                            was obtained. Prior Anticoagulants: The patient has                            taken no previous anticoagulant or antiplatelet                            agents. ASA Grade Assessment: III - A patient with                            severe systemic disease. After reviewing the risks  and benefits, the patient was deemed in                            satisfactory condition to undergo the procedure.                           - Sedation was administered by an anesthesia                            professional. Deep sedation was attained.                           After obtaining informed consent, the colonoscope                            was passed under  direct vision. Throughout the                            procedure, the patient's blood pressure, pulse, and                            oxygen saturations were monitored continuously. The                            CF-HQ190L ([id number] Olympus colonoscope was                            introduced through the anus and advanced to the the                            cecum, identified by appendiceal orifice and                            ileocecal valve. The colonoscopy was performed                            without difficulty. The patient tolerated the                            procedure well. The quality of the bowel                            preparation was good. The ileocecal valve,                            appendiceal orifice, and rectum were photographed. Scope In: 1:40:39 PM Scope Out: 2:02:51 PM Scope Withdrawal Time: 0 hours 14 minutes 32 seconds  Total Procedure Duration: 0 hours 22 minutes 12 seconds  Findings:      A fungating, infiltrative and ulcerated non-obstructing large mass was       found in the proximal descending colon. The mass was partially       circumferential (involving two-thirds of the lumen circumference). The       mass measured three - four cm in length. No bleeding was present. This  was biopsied with a cold forceps for histology. Area was tattooed with       an injection of 5 mL of Spot (carbon black).      A large mass was identifed in the proximal descending colon. The prior       tattoo site was not identified. The mass was approximately 75%       circumfirential and it measured 3-4 cm in length. Multiple cold biopsies       were obtained. The area was tattooed several centimeters proximal and       distal to the lesion. 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                            Biopsied. Tattooed. Moderate Sedation:      Not Applicable - Patient had care per Anesthesia. Recommendation:           -  Return patient to hospital ward for ongoing care.                           - Resume regular diet.                           - Continue present medications.                           - Await pathology results.                           - CT scan of the chest/ABD/pelvis.                           - Check CEA level. Procedure Code(s):        --- Professional ---                           [phone], Colonoscopy, flexible; with directed                            submucosal injection(s), any substance                           [id number], Colonoscopy, flexible; with biopsy, single                            or multiple Diagnosis Code(s):        --- Professional ---                           C18.6, Malignant neoplasm of descending colon                           R19.5, Other fecal abnormalities                           D50.9, Iron deficiency anemia, unspecified CPT copyright 2019 American Medical Association. All rights reserved. The codes documented in this report are preliminary  and upon coder review may  be revised to meet current compliance requirements. Carol Ada, MD Carol Ada, MD 07/27/2020 2:13:22 PM This report has been signed electronically. Number of Addenda: 0

## 2020-07-27 NOTE — Progress Notes (Signed)
PROGRESS NOTE    Jeffrey Young  EVO:[id number] DOB: 1965-12-20 DOA: 07/24/2020 PCP: [name], MD    Chief Complaint  Patient presents with  . Hypoglycemia  . Shortness of Breath    Brief Narrative:  Pleasant 55 year old gentleman history of type 2 diabetes, history of left transmetatarsal amputation of the foot, history of colonic polyps including 30 mm polyp which was unable to be resected on colonoscopy January 2020.  Patient was referred to [facility] for resection however all elective procedures were canceled at that time due to the Covid pandemic.  Patient with no prior history of heart failure. Patient presented to the ED with complaints of lightheadedness noted to have a blood glucose level of 34, 5-day history of exertional dyspnea, worsening lower extremity edema, abdominal swelling, cough all which are new.  Patient seen in the ED noted to have systolic blood pressures in the 160s, chest x-ray consistent with pulmonary edema, elevated BNP, elevated creatinine of 1.6, hemoglobin of 6.0.  Patient denied any melanotic stools, hematochezia, hematemesis.  FOBT noted to be positive. 2 units of packed red blood cells ordered for transfusion. GI consulted.   Assessment & Plan:   Principal Problem:   New onset of congestive heart failure (HCC) Active Problems:   DM2 (diabetes mellitus, type 2) (HCC)   Anemia   GIB (gastrointestinal bleeding)   ARF (acute renal failure) (HCC)   Symptomatic anemia   Hypertension  1 new onset acute diastolic CHF Questionable etiology.  Patient with a history of type 2 diabetes, history of transmetatarsal amputation, noted to have elevated blood pressure on admission with new onset CHF with dyspnea on exertion, lower extremity edema. New onset CHF could partly be secondary to subacute on chronic GI bleed leading to significant anemia as patient presented with a hemoglobin of 6.0.   -Patient placed on IV Lasix early on during the hospitalization with  good urine output.  -Lasix held in anticipation of colonoscopy and increased creatinine.   -Clinical/symptomatic improvement.   - With urine output of 4.175 L over the past 24 hours.   -Current weight of 192.3 pounds from 225.31 pounds from 227 pounds from 220 pounds.    -  EKG with normal sinus rhythm with Q waves in lead III.  Patient denies any chest pain.   -2D echo with EF of 55 to 60%, no wall motion abnormalities, mild LVH.   -Continue Coreg and hydralazine. -Unable to place on the ACE inhibitor at this time due to worsening renal function.  -Transfuse to keep hemoglobin > 8. -Fasting lipid panel with LDL of 94.  -Cardiology consulted and following.  2.  Symptomatic anemia/GI bleed Likely subacute on chronic slow GI bleed.  Patient denies any significant melanotic stools or hematochezia.  FOBT positive.  Hemoglobin noted to be 6.0 on admission.  Patient transfused total of 3 units packed red blood cells with hemoglobin currently 8.7.   -Patient noted to have had a 30 mm descending colonic polyp in January 2020 that was never removed as all elective surgeries were canceled in January 2020 due to Covid pandemic as patient was referred to [facility]. -Patient seen in consultation by GI and patient for colonoscopy today for further evaluation.   -Follow H&H.   -Follow H&H.   -Transfusion threshold hemoglobin < 8.  -Per GI.  3.  Diabetes mellitus type 2 Patient noted to have had a hypoglycemic spell on admission with a CBG of 34.  Hemoglobin A1c 7.2.  CBG 113.  Patient  currently NPO.  Continue to hold Lantus.  Placed on D5 normal saline at 10 cc/h.  Continue CBG every 4 hours.  SSI.  Follow.  4.  Acute kidney injury Likely secondary to problem #1.  Last creatinine of approximately 1.26 (01/26/2018).  Urinalysis nitrite negative, leukocytes negative, 100 protein.  Patient diuresed with IV Lasix due to new onset CHF.  Lasix held yesterday secondary to colonoscopy and bump in renal function.  Urine  output of 550 cc over the past 24 hours.  Renal ultrasound negative for hydronephrosis, lateral thickening up to 6 mm, simple right renal cyst, otherwise unremarkable sonographic appearance of kidneys, bilateral pleural effusions.  Renal function trending down creatinine currently at 1.65 from 1.95.  Monitor urine output.  Follow.   5.  Hypertension Better controlled on current regimen of Coreg, hydralazine.  Cardiology following.      DVT prophylaxis: SCDs Code Status: Full Family Communication: Updated patient.  Updated wife at bedside.  Disposition:   Status is: Inpatient    Dispo: The patient is from: Home              Anticipated d/c is to: Home hopefully 2 to 3 days per              Patient status post transfusion 3 units packed red blood cells for anemia.  Anemia work-up underway.  Patient for colonoscopy today.  Patient with new onset CHF.  Cardiology following.  Not stable for discharge.     Difficult to place patient no       Consultants:   Gastroenterology: [name] 07/25/2020  Cardiology: [name] 07/26/2020  Procedures:   Transfusion 3 units packed red blood cells 07/25/2020  2D echo 07/25/2020  Chest x-ray 07/24/2020  Colonoscopy pending 07/27/2020  Renal ultrasound 07/26/2020  Antimicrobials:   None   Subjective: Patient sleeping but arousable.  Denies any shortness of breath.  No chest pain.  No abdominal pain.  Good urine output.  Stated has been having bowel movements all night and had a light red change to her last bowel movement.  Hungry.  Awaiting colonoscopy this afternoon.   Objective: Vitals:   07/27/20 0416 07/27/20 0500 07/27/20 0633 07/27/20 0757  BP: (!) 162/85  (!) 153/93 131/78  Pulse: 79  79 83  Resp: 14  19   Temp: 98.5 F (36.9 C)  98.6 F (37 C)   TempSrc: Oral  Oral   SpO2: 98%  98%   Weight:  87.2 kg    Height:        Intake/Output Summary (Last 24 hours) at 07/27/2020 1055 Last data filed at 07/27/2020 1000 Gross per 24  hour  Intake 4843 ml  Output 300 ml  Net 4543 ml   Filed Weights   07/25/20 0127 07/26/20 0032 07/27/20 0500  Weight: 103 kg 102.2 kg 87.2 kg    Examination:  General exam: NAD Respiratory system: Clear to auscultation bilaterally.  No wheezes, no crackles, no rhonchi.  Normal Rester effort.  Speaking in full sentences.  No use of accessory muscles of respiration.   Cardiovascular system: RRR no murmurs rubs or gallops.  No JVD.  Left lower extremity with no edema.  1+ right lower extremity edema. Gastrointestinal system: Abdomen is soft, nontender, nondistended, positive bowel sounds.  No rebound.  No guarding.  Central nervous system: Alert and oriented. No focal neurological deficits. Extremities: Left transmetatarsal amputation foot.  1+ right lower extremity edema.  No edema left lower extremity.  Skin:  No rashes, lesions or ulcers Psychiatry: Judgement and insight appear normal. Mood & affect appropriate.     Data Reviewed: I have personally reviewed following labs and imaging studies  CBC: Recent Labs  Lab 07/24/20 2125 07/25/20 1044 07/25/20 1700 07/26/20 0514 07/26/20 1517 07/27/20 0601  WBC 7.2 7.1  --  7.6  --  7.7  NEUTROABS 4.7  --   --  4.5  --  4.7  HGB 6.0* 7.5* 9.2* 8.3* 8.9* 8.7*  HCT 20.3* 23.5* 28.8* 27.4* 28.1* 27.0*  MCV 86.4 86.4  --  90.1  --  86.0  PLT 281 307  --  292  --  625    Basic Metabolic Panel: Recent Labs  Lab 07/24/20 2125 07/25/20 1044 07/26/20 0514 07/27/20 0601  NA 136 135 133* 132*  K 4.3 3.8 3.7 3.9  CL 107 104 101 103  CO2 20* 22 24 22   GLUCOSE 208* 151* 118* 116*  BUN 32* 26* 28* 26*  CREATININE 1.64* 1.57* 1.95* 1.65*  CALCIUM 8.5* 8.3* 8.6* 8.4*  MG  --   --   --  1.9    GFR: Estimated Creatinine Clearance: 55.8 mL/min (A) (by C-G formula based on SCr of 1.65 mg/dL (H)).  Liver Function Tests: Recent Labs  Lab 07/24/20 2125  AST 26  ALT 19  ALKPHOS 118  BILITOT 0.8  PROT 6.8  ALBUMIN 3.3*     CBG: Recent Labs  Lab 07/26/20 1659 07/26/20 2004 07/27/20 0006 07/27/20 0413 07/27/20 0739  GLUCAP 177* 150* 120* 113* 110*     Recent Results (from the past 240 hour(s))  Culture, Urine     Status: None   Collection Time: 07/25/20  9:31 AM   Specimen: Urine, Clean Catch  Result Value Ref Range Status   Specimen Description   Final    URINE, CLEAN CATCH Performed at [facility], [place] [address] [place], [place] [address]    Special Requests   Final    URINE, RANDOM Performed at [facility] [address] [place], [place] [address]    Culture   Final    NO GROWTH Performed at [facility], [place] [address], [place], [place] [address]    Report Status 07/26/2020 FINAL  Final         Radiology Studies: US RENAL  Result Date: 07/26/2020 CLINICAL DATA:  Acute kidney injury. EXAM: RENAL / URINARY TRACT ULTRASOUND COMPLETE COMPARISON:  None. FINDINGS: Right Kidney: Renal measurements: 11.8 x 5.8 x 5.9 cm = volume: 211 mL. Parenchymal echogenicity within normal limits. There is a 2.1 x 2.3 x 1.7 cm cyst in the mid kidney. No solid mass or hydronephrosis visualized. Left Kidney: Renal measurements: 12.0 x 7.4 x 4.9 cm = volume: 229 mL. Parenchymal echogenicity within normal limits. No mass or hydronephrosis visualized. Bladder: Mild circumferential bladder wall thickening of 6 mm. Both ureteral jets are seen. Other: Bilateral pleural effusions. IMPRESSION: 1. Lateral thickening up to 6 mm. Recommend correlation with urinalysis. 2. Simple right renal cyst. Otherwise unremarkable sonographic appearance of the kidneys. 3. Bilateral pleural effusions. Electronically Signed   By: Keith Rake M.D.   On: 07/26/2020 18:06        Scheduled Meds: . carvedilol  3.125 mg Oral BID WC  . fluticasone  2 spray Each Nare Daily  . hydrALAZINE  25 mg Oral Q8H  . insulin aspart  0-9 Units Subcutaneous Q4H  . loratadine  10 mg Oral Daily   .  sodium chloride flush  3 mL Intravenous Q12H   Continuous Infusions: . sodium chloride    . dextrose 5 % and 0.9% NaCl       LOS: 3 days    Time spent: 45 minutes     Irine Seal, MD [facility]   To contact the attending provider between 7A-7P or the covering provider during after hours 7P-7A, please log into the web site [email or web address] and access using universal [place] password for that web site. If you do not have the password, please call the hospital operator.  07/27/2020, 10:55 AM

## 2020-07-27 NOTE — Transfer of Care (Signed)
Immediate Anesthesia Transfer of Care Note  Patient: Jeffrey Young  Procedure(s) Performed: COLONOSCOPY WITH PROPOFOL (N/A ) BIOPSY  Patient Location: PACU  Anesthesia Type:MAC  Level of Consciousness: awake, alert  and oriented  Airway & Oxygen Therapy: Patient Spontanous Breathing and Patient connected to face mask oxygen  Post-op Assessment: Report given to RN and Post -op Vital signs reviewed and stable  Post vital signs: Reviewed and stable  Last Vitals:  Vitals Value Taken Time  BP 108/48 07/27/20 1411  Temp    Pulse 72 07/27/20 1417  Resp 15 07/27/20 1415  SpO2 94 % 07/27/20 1417  Vitals shown include unvalidated device data.  Last Pain:  Vitals:   07/27/20 1316  TempSrc: Oral  PainSc: 0-No pain         Complications: No complications documented.

## 2020-07-27 NOTE — Interval H&P Note (Signed)
History and Physical Interval Note:  07/27/2020 1:31 PM  Jeffrey Young  has presented today for surgery, with the diagnosis of IDA and history of a large polyp.  The various methods of treatment have been discussed with the patient and family. After consideration of risks, benefits and other options for treatment, the patient has consented to  Procedure(s): COLONOSCOPY WITH PROPOFOL (N/A) as a surgical intervention.  The patient's history has been reviewed, patient examined, no change in status, stable for surgery.  I have reviewed the patient's chart and labs.  Questions were answered to the patient's satisfaction.     Jeffrey Young

## 2020-07-27 NOTE — Anesthesia Preprocedure Evaluation (Addendum)
Anesthesia Evaluation  Patient identified by MRN, date of birth, ID band Patient awake    Reviewed: Allergy & Precautions, NPO status , Patient's Chart, lab work & pertinent test results  History of Anesthesia Complications Negative for: history of anesthetic complications  Airway Mallampati: II  TM Distance: >3 FB Neck ROM: Full    Dental  (+) Poor Dentition, Chipped, Missing, Dental Advisory Given   Pulmonary neg pulmonary ROS,  07/24/2020 SARS coronavirus NEG   breath sounds clear to auscultation       Cardiovascular hypertension, Pt. on medications (-) angina Rhythm:Regular Rate:Normal  07/25/2020 ECHO: EF 55-60%, mild MR   Neuro/Psych negative neurological ROS     GI/Hepatic Neg liver ROS, GERD  Controlled,  Endo/Other  diabetes (glu 124), Insulin Dependent  Renal/GU Renal InsufficiencyRenal disease (creat 1.65)     Musculoskeletal   Abdominal   Peds  Hematology  (+) Blood dyscrasia (Hb 8.7), anemia ,   Anesthesia Other Findings   Reproductive/Obstetrics                            Anesthesia Physical Anesthesia Plan  ASA: III  Anesthesia Plan: MAC   Post-op Pain Management:    Induction:   PONV Risk Score and Plan: 1 and Ondansetron and Treatment may vary due to age or medical condition  Airway Management Planned: Natural Airway and Simple Face Mask  Additional Equipment: None  Intra-op Plan:   Post-operative Plan:   Informed Consent: I have reviewed the patients History and Physical, chart, labs and discussed the procedure including the risks, benefits and alternatives for the proposed anesthesia with the patient or authorized representative who has indicated his/her understanding and acceptance.     Dental advisory given  Plan Discussed with: CRNA and Surgeon  Anesthesia Plan Comments:        Anesthesia Quick Evaluation

## 2020-07-27 NOTE — Plan of Care (Signed)
  Problem: Health Behavior/Discharge Planning: Goal: Ability to manage health-related needs will improve Outcome: Progressing   Problem: Clinical Measurements: Goal: Ability to maintain clinical measurements within normal limits will improve Outcome: Progressing Goal: Will remain free from infection Outcome: Progressing Goal: Diagnostic test results will improve Outcome: Progressing Goal: Respiratory complications will improve Outcome: Progressing Goal: Cardiovascular complication will be avoided Outcome: Progressing   Problem: Activity: Goal: Risk for activity intolerance will decrease Outcome: Progressing   Problem: Nutrition: Goal: Adequate nutrition will be maintained Outcome: Progressing   Problem: Coping: Goal: Level of anxiety will decrease Outcome: Progressing   Problem: Elimination: Goal: Will not experience complications related to bowel motility Outcome: Progressing Goal: Will not experience complications related to urinary retention Outcome: Progressing   Problem: Pain Managment: Goal: General experience of comfort will improve Outcome: Progressing   Problem: Safety: Goal: Ability to remain free from injury will improve Outcome: Progressing   Problem: Skin Integrity: Goal: Risk for impaired skin integrity will decrease Outcome: Progressing   Problem: Education: Goal: Ability to demonstrate management of disease process will improve Outcome: Progressing Goal: Ability to verbalize understanding of medication therapies will improve Outcome: Progressing   Problem: Activity: Goal: Capacity to carry out activities will improve Outcome: Progressing   Problem: Cardiac: Goal: Ability to achieve and maintain adequate cardiopulmonary perfusion will improve Outcome: Progressing   
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
## 2020-07-28 ENCOUNTER — Encounter (HOSPITAL_COMMUNITY): Payer: Self-pay | Admitting: Gastroenterology

## 2020-07-28 ENCOUNTER — Inpatient Hospital Stay: Payer: Self-pay

## 2020-07-28 DIAGNOSIS — K6389 Other specified diseases of intestine: Secondary | ICD-10-CM

## 2020-07-28 LAB — BASIC METABOLIC PANEL
Anion gap: 7 (ref 5–15)
BUN: 29 mg/dL — ABNORMAL HIGH (ref 6–20)
CO2: 23 mmol/L (ref 22–32)
Calcium: 8.8 mg/dL — ABNORMAL LOW (ref 8.9–10.3)
Chloride: 102 mmol/L (ref 98–111)
Creatinine, Ser: 1.85 mg/dL — ABNORMAL HIGH (ref 0.61–1.24)
GFR, Estimated: 42 mL/min — ABNORMAL LOW (ref >60)
Glucose, Bld: 150 mg/dL — ABNORMAL HIGH (ref 70–99)
Potassium: 4.4 mmol/L (ref 3.5–5.1)
Sodium: 132 mmol/L — ABNORMAL LOW (ref 135–145)

## 2020-07-28 LAB — CBC
HCT: 27 % — ABNORMAL LOW (ref 39.0–52.0)
Hemoglobin: 8.4 g/dL — ABNORMAL LOW (ref 13.0–17.0)
MCH: 27.3 pg (ref 26.0–34.0)
MCHC: 31.1 g/dL (ref 30.0–36.0)
MCV: 87.7 fL (ref 80.0–100.0)
Platelets: 302 10*3/uL (ref 150–400)
RBC: 3.08 MIL/uL — ABNORMAL LOW (ref 4.22–5.81)
RDW: 15 % (ref 11.5–15.5)
WBC: 8.4 10*3/uL (ref 4.0–10.5)
nRBC: 0 % (ref 0.0–0.2)

## 2020-07-28 LAB — CEA: CEA: 17.3 ng/mL — ABNORMAL HIGH (ref 0.0–4.7)

## 2020-07-28 LAB — GLUCOSE, CAPILLARY
Glucose-Capillary: 124 mg/dL — ABNORMAL HIGH (ref 70–99)
Glucose-Capillary: 154 mg/dL — ABNORMAL HIGH (ref 70–99)
Glucose-Capillary: 165 mg/dL — ABNORMAL HIGH (ref 70–99)
Glucose-Capillary: 185 mg/dL — ABNORMAL HIGH (ref 70–99)
Glucose-Capillary: 192 mg/dL — ABNORMAL HIGH (ref 70–99)
Glucose-Capillary: 196 mg/dL — ABNORMAL HIGH (ref 70–99)
Glucose-Capillary: 253 mg/dL — ABNORMAL HIGH (ref 70–99)

## 2020-07-28 MED ORDER — SODIUM CHLORIDE 0.9% FLUSH
10.0000 mL | Freq: Two times a day (BID) | INTRAVENOUS | Status: DC
  Administered 2020-07-30 (×2): 10 mL

## 2020-07-28 MED ORDER — SODIUM CHLORIDE 0.9% FLUSH
10.0000 mL | INTRAVENOUS | Status: DC | PRN
Start: 2020-07-28 — End: 2020-07-31
  Administered 2020-07-29: 10 mL

## 2020-07-28 NOTE — Progress Notes (Addendum)
PROGRESS NOTE    Jeffrey Young  JKD:[id number] DOB: 13-Mar-1966 DOA: 07/24/2020 PCP: [name], MD   Brief Narrative:  Pleasant 55 year old gentleman history of type 2 diabetes, history of left transmetatarsal amputation of the foot, history of colonic polyps including 30 mm polyp which was unable to be resected on colonoscopy January 2020.  Patient was referred to [facility] for resection however all elective procedures were canceled at that time due to the Covid pandemic.  Patient with no prior history of heart failure. Patient presented to the ED with complaints of lightheadedness noted to have a blood glucose level of 34, 5-day history of exertional dyspnea, worsening lower extremity edema, abdominal swelling, cough all which are new.  Patient seen in the ED noted to have systolic blood pressures in the 160s, chest x-ray consistent with pulmonary edema, elevated BNP, elevated creatinine of 1.6, hemoglobin of 6.0.  Patient denied any melanotic stools, hematochezia, hematemesis.  FOBT noted to be positive. 2 units of packed red blood cells ordered for transfusion. GI consulted.   Assessment & Plan:   Principal Problem:   New onset of congestive heart failure (HCC) Active Problems:   DM2 (diabetes mellitus, type 2) (HCC)   Anemia   GIB (gastrointestinal bleeding)   ARF (acute renal failure) (HCC)   Symptomatic anemia   Hypertension  New onset acute diastolic CHF, POA - Echo EF 55-60% - indeterminate diastolic dysfunction - Initially IV Lasix early on during the hospitalization with good urine output; but held in anticipation of colonoscopy and increased creatinine.   -Clinical/symptomatic improvement. Lab Results  Component Value Date   CREATININE 1.85 (H) 07/28/2020   CREATININE 1.65 (H) 07/27/2020   CREATININE 1.95 (H) 07/26/2020  - Continue Coreg and hydralazine. - Unable to place on the ACE inhibitor at this time due to worsening renal function.  - Transfuse to keep hemoglobin >  8. - Fasting lipid panel with LDL of 94.  - Cardiology consulted and following.  Acute symptomatic anemia/GI bleed Likely subacute on chronic slow GI bleed.  Patient denies any significant melanotic stools or hematochezia.  FOBT positive.  Hemoglobin noted to be 6.0 on admission.  Patient transfused total of 3 units packed red blood cells with hemoglobin currently 8.7.   -Patient noted to have had a 30 mm descending colonic polyp in January 2020 that was never removed as all elective surgeries were canceled in January 2020 due to Covid pandemic as patient was referred to [facility]. -Patient seen in consultation by GI and patient for colonoscopy today for further evaluation - Will likely need surgical evaluation at discharge, patient is not sure if he wants to follow back up at [facility] or stay locally for possible procedure - CT abdomen/pelvis pending.      Component Value Date/Time   HGB 8.4 (L) 07/28/2020 0505   HCT 27.0 (L) 07/28/2020 0505   Diabetes mellitus type 2, uncontrolled Patient noted to have had a hypoglycemic spell on admission with a CBG of 34.  Hemoglobin A1c 7.2.  CBG 113.  Patient currently NPO.  Continue to hold Lantus.  Placed on D5 normal saline at 10 cc/h.  Continue CBG every 4 hours.  SSI.  Follow. Lab Results  Component Value Date   HGBA1C 7.2 (H) 07/25/2020   Acute kidney injury - Secondary to above/diuresis - continue to hold - Follow UOP - creatinine somewhat labile over the past 48h as above.   Hypertension Better controlled on current regimen of Coreg, hydralazine.  Cardiology following.  DVT prophylaxis: SCDs Code Status: Full Family Communication: Updated patient Disposition:   Status is: Inpatient    Dispo: The patient is from: Home              Anticipated d/c is to: Home hopefully 24/48h               Patient status post transfusion 3 units packed red blood cells for anemia.  Anemia work-up underway.  Patient for colonoscopy today.  Patient with new onset  CHF.  Cardiology following.  Not stable for discharge.     Difficult to place patient: No   Consultants:   Gastroenterology: [name] 07/25/2020  Cardiology: [name] 07/26/2020  Procedures:   Transfusion 3 units packed red blood cells 07/25/2020  2D echo 07/25/2020  Chest x-ray 07/24/2020  Colonoscopy pending 07/27/2020  Renal ultrasound 07/26/2020  Antimicrobials:   None  Subjective: No acute issues/events overnight, denies any melena, hematochezia, abdominal pain or fatigue.   Objective: Vitals:   07/27/20 1432 07/27/20 2043 07/28/20 0424 07/28/20 0428  BP: 104/64 140/81 (!) 150/86   Pulse: 78 77 79   Resp: (!) 24 14 18    Temp:  98.4 F (36.9 C)    TempSrc:  Oral    SpO2: 99% 100% 100%   Weight:    100.1 kg  Height:        Intake/Output Summary (Last 24 hours) at 07/28/2020 0735 Last data filed at 07/28/2020 0400 Gross per 24 hour  Intake 1034.74 ml  Output 1502 ml  Net -467.26 ml   Filed Weights   07/26/20 0032 07/27/20 0500 07/28/20 0428  Weight: 102.2 kg 87.2 kg 100.1 kg    Examination:  General exam: NAD, resting comfortably in bed Respiratory system: Clear to auscultation bilaterally.  No wheezes, no crackles, no rhonchi.  Normal respiratory effort.  Speaking in full sentences.  No use of accessory muscles.   Cardiovascular system: RRR no murmurs rubs or gallops.  No JVD.  Left lower extremity with no edema.  1+ right lower extremity edema. Gastrointestinal system: Abdomen is soft, nontender, nondistended, positive bowel sounds.  No rebound.  No guarding.  Central nervous system: Alert and oriented. No focal neurological deficits. Extremities: Left transmetatarsal amputation foot. Scant edema bilaterally Skin: No rashes, lesions or ulcers Psychiatry: Judgement and insight appear normal. Mood & affect appropriate.     Data Reviewed: I have personally reviewed following labs and imaging studies  CBC: Recent Labs  Lab 07/24/20 2125 07/25/20 1044  07/25/20 1700 07/26/20 0514 07/26/20 1517 07/27/20 0601 07/27/20 1646 07/28/20 0505  WBC 7.2 7.1  --  7.6  --  7.7  --  8.4  NEUTROABS 4.7  --   --  4.5  --  4.7  --   --   HGB 6.0* 7.5*   < > 8.3* 8.9* 8.7* 9.3* 8.4*  HCT 20.3* 23.5*   < > 27.4* 28.1* 27.0* 29.3* 27.0*  MCV 86.4 86.4  --  90.1  --  86.0  --  87.7  PLT 281 307  --  292  --  297  --  302   < > = values in this interval not displayed.    Basic Metabolic Panel: Recent Labs  Lab 07/24/20 2125 07/25/20 1044 07/26/20 0514 07/27/20 0601 07/28/20 0505  NA 136 135 133* 132* 132*  K 4.3 3.8 3.7 3.9 4.4  CL 107 104 101 103 102  CO2 20* 22 24 22 23   GLUCOSE 208* 151* 118* 116*  150*  BUN 32* 26* 28* 26* 29*  CREATININE 1.64* 1.57* 1.95* 1.65* 1.85*  CALCIUM 8.5* 8.3* 8.6* 8.4* 8.8*  MG  --   --   --  1.9  --     GFR: Estimated Creatinine Clearance: 54.8 mL/min (A) (by C-G formula based on SCr of 1.85 mg/dL (H)).  Liver Function Tests: Recent Labs  Lab 07/24/20 2125  AST 26  ALT 19  ALKPHOS 118  BILITOT 0.8  PROT 6.8  ALBUMIN 3.3*    CBG: Recent Labs  Lab 07/27/20 1316 07/27/20 1646 07/27/20 2040 07/28/20 0002 07/28/20 0424  GLUCAP 122* 181* 177* 192* 154*     Recent Results (from the past 240 hour(s))  Culture, Urine     Status: None   Collection Time: 07/25/20  9:31 AM   Specimen: Urine, Clean Catch  Result Value Ref Range Status   Specimen Description   Final    URINE, CLEAN CATCH Performed at [facility], [place] [address], [place], [place] [address]    Special Requests   Final    URINE, RANDOM Performed at [facility] [address], [place], [place] [address]    Culture   Final    NO GROWTH Performed at [facility], [place] [address], [place], [place] [address]    Report Status 07/26/2020 FINAL  Final         Radiology Studies: US RENAL  Result Date: 07/26/2020 CLINICAL DATA:  Acute kidney injury. EXAM: RENAL / URINARY TRACT  ULTRASOUND COMPLETE COMPARISON:  None. FINDINGS: Right Kidney: Renal measurements: 11.8 x 5.8 x 5.9 cm = volume: 211 mL. Parenchymal echogenicity within normal limits. There is a 2.1 x 2.3 x 1.7 cm cyst in the mid kidney. No solid mass or hydronephrosis visualized. Left Kidney: Renal measurements: 12.0 x 7.4 x 4.9 cm = volume: 229 mL. Parenchymal echogenicity within normal limits. No mass or hydronephrosis visualized. Bladder: Mild circumferential bladder wall thickening of 6 mm. Both ureteral jets are seen. Other: Bilateral pleural effusions. IMPRESSION: 1. Lateral thickening up to 6 mm. Recommend correlation with urinalysis. 2. Simple right renal cyst. Otherwise unremarkable sonographic appearance of the kidneys. 3. Bilateral pleural effusions. Electronically Signed   By: Keith Rake M.D.   On: 07/26/2020 18:06        Scheduled Meds: . carvedilol  3.125 mg Oral BID WC  . fluticasone  2 spray Each Nare Daily  . hydrALAZINE  25 mg Oral Q8H  . insulin aspart  0-9 Units Subcutaneous Q4H  . loratadine  10 mg Oral Daily  . sodium chloride flush  3 mL Intravenous Q12H   Continuous Infusions: . sodium chloride       LOS: 4 days    Time spent: 45 minutes     Little Ishikawa, DO [facility] Pager: Secure chat  07/28/2020, 7:35 AM

## 2020-07-28 NOTE — Consult Note (Addendum)
[facility] Surgery Consult Note  Dequon Schnebly 1965/08/08  [id number].    Requesting MD: Carol Ada Chief Complaint: Shortness of breath bilateral lower extremity swelling and hypoglycemia Reason for Consult: Descending colon mass.  HPI: Patient is a 55 year old male with history of type 2 diabetes, transmetatarsal foot amputation.  He has a history of colon polyps was found to have a 30 mm polyp that was not resectable in January 2020.  He was referred to [facility] for resection however all elective procedures were canceled due to the Covid pandemic.  Patient presented to the ED on 07/25/2020, with lightheadedness, blood glucose of 34, reported dyspnea on exertion bilateral lower extremity swelling abdominal swelling, cough, and new dyspnea on exertion. He reports DOE, orthopnea, PND, swelling started last week.  No sx he is aware of prior.  Work-up on admission showed he was afebrile but his blood pressure was 177/95.  Admission labs later showed glucose 208, BUN of 32, creatinine 1.64, GFR 48, BNP 210, WBC 7.2, hemoglobin 6.0, hematocrit 20.3, platelets 281,000.  He was seen and admitted by medicine.  GI consult by [name] he was Hemoccult positive in the ED.  Because of his history of prior polyp in 2020 he was seen again by Dr. Carol Ada.  He underwent repeat colonoscopy on 07/27/2020.  This showed a large fungating gaiting infiltrative and ulcerated nonobstructing large mass in the proximal descending colon mass was partially circumferential biopsies were obtained and the area was tattooed.  It was his impression that this is malignant tumor and we were asked to see in consultation.  He was also seen by Cardiology and found to have acute [facility] with CXR showeing congestion, edema, and bilateral pleural effusions.  They are following.   CT chest:Three small pulmonary nodules less than 5 mm. Favor benign.   CT abdomen: Circumferential luminal narrowing in the distal transverse colon is  nonspecific but may represent the malignancy cyst identified on colonoscopy in the LEFT colon. Asymmetric thickening within the descending colon is also noted.  No evidence of metastatic adenopathy in the mesentery porta hepatis.  No liver metastasis.  No prior surgeries except Tranmet amputation. ROS: Review of Systems  Constitutional: Negative.   HENT: Negative.   Eyes: Negative.   Respiratory: Positive for cough and shortness of breath. Negative for hemoptysis, sputum production and wheezing.   Cardiovascular: Positive for orthopnea, leg swelling and PND.  Gastrointestinal: Positive for heartburn (occasional). Negative for abdominal pain, blood in stool, constipation, diarrhea, melena, nausea and vomiting.       He says BM's have been normal and stool color has been normal.  Genitourinary: Negative.   Musculoskeletal: Negative.   Skin: Negative.   Neurological: Negative.   Endo/Heme/Allergies: Negative.   Psychiatric/Behavioral: Negative.    Colonoscopy report 07/27/2020: A fungating, infiltrative and ulcerated non-obstructing large mass was found in the proximal descending colon. The mass was partially circumferential (involving two-thirds of the lumen circumference). The mass measured three - four cm in length. No bleeding was present. This was biopsied with a cold forceps for histology. Area was tattooed with an injection of 5 mL of Spot (carbon black). A large mass was identifed in the proximal descending colon. The prior tattoo site was not identified. The mass was approximately 75% circumfirential and it measured 3-4 cm in length. Multiple cold biopsies were obtained. The area was tattooed several centimeters proximal and distal to the lesion. 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 Biopsied. Tattooed.  Family History  Problem Relation Age of Onset  . Hypertension Father     Past Medical History:  Diagnosis Date  . Diabetes mellitus  without complication ([place])    type 2    Past Surgical History:  Procedure Laterality Date  . AMPUTATION Left 01/22/2018   Procedure: TRANSMETATARSAL AMPUTATION;  Surgeon: Newt Minion, MD;  Location: [facility];  Service: Orthopedics;  Laterality: Left;toes  . COLONOSCOPY WITH PROPOFOL N/A 05/24/2018   Procedure: COLONOSCOPY WITH PROPOFOL;  Surgeon: Carol Ada, MD;  Location: [facility];  Service: Endoscopy;  Laterality: N/A;  . POLYPECTOMY  05/24/2018   Procedure: POLYPECTOMY;  Surgeon: Carol Ada, MD;  Location: [facility];  Service: Endoscopy;;    Social History:  reports that he has never smoked. He has never used smokeless tobacco. He reports current alcohol use of about 1.0 standard drink of alcohol per week. He reports that he does not use drugs.  Allergies:  Allergies  Allergen Reactions  . Bee Venom Swelling    Cold Sweats  . Latex Rash    Medications Prior to Admission  Medication Sig Dispense Refill  . atorvastatin (LIPITOR) 10 MG tablet Take 10 mg by mouth daily.    . Insulin Glargine-Lixisenatide 100-33 UNT-MCG/ML SOPN Inject 10-15 Units into the skin See admin instructions. Inject 10u under the skin in the morning and 15u under the in the evening      Blood pressure (!) 157/57, pulse 79, temperature 98.4 F (36.9 C), temperature source Oral, resp. rate 18, height 6\' 1"  (1.854 m), weight 100.1 kg, SpO2 100 %. Physical Exam:  General: pleasant, WD, WN AA male who is laying in bed in NAD HEENT: head is normocephalic, atraumatic.  Sclera are noninjected.  Pupils are equal. Ears and nose without any masses or lesions.  Mouth is pink and moist Heart: regular, rate, and rhythm.  Normal s1,s2. No obvious murmurs, gallops, or rubs noted.  Palpable radial and pedal pulses bilaterally Lungs: CTAB, no wheezes, rhonchi, or rales noted.  Respiratory effort nonlabored Abd: soft, NT, ND, +BS, no masses, hernias, or organomegaly MS: all 4 extremities are symmetrical with no  cyanosis, clubbing, or edema. Skin: warm and dry with no masses, lesions, or rashes Neuro: Cranial nerves 2-12 grossly intact, sensation is normal throughout Psych: A&Ox3 with an appropriate affect.   Results for orders placed or performed during the hospital encounter of 07/24/20 (from the past 48 hour(s))  Glucose, capillary     Status: Abnormal   Collection Time: 07/26/20 11:58 AM  Result Value Ref Range   Glucose-Capillary 180 (H) 70 - 99 mg/dL    Comment: Glucose reference range applies only to samples taken after fasting for at least 8 hours.  Hemoglobin and hematocrit, blood     Status: Abnormal   Collection Time: 07/26/20  3:17 PM  Result Value Ref Range   Hemoglobin 8.9 (L) 13.0 - 17.0 g/dL   HCT 28.1 (L) 39.0 - 52.0 %    Comment: Performed at [facility], [place] [address], [place], [place] [address]  Glucose, capillary     Status: Abnormal   Collection Time: 07/26/20  4:59 PM  Result Value Ref Range   Glucose-Capillary 177 (H) 70 - 99 mg/dL    Comment: Glucose reference range applies only to samples taken after fasting for at least 8 hours.  Glucose, capillary     Status: Abnormal   Collection Time: 07/26/20  8:04 PM  Result Value Ref Range   Glucose-Capillary  150 (H) 70 - 99 mg/dL    Comment: Glucose reference range applies only to samples taken after fasting for at least 8 hours.  Glucose, capillary     Status: Abnormal   Collection Time: 07/27/20 12:06 AM  Result Value Ref Range   Glucose-Capillary 120 (H) 70 - 99 mg/dL    Comment: Glucose reference range applies only to samples taken after fasting for at least 8 hours.  Glucose, capillary     Status: Abnormal   Collection Time: 07/27/20  4:13 AM  Result Value Ref Range   Glucose-Capillary 113 (H) 70 - 99 mg/dL    Comment: Glucose reference range applies only to samples taken after fasting for at least 8 hours.  Basic metabolic panel     Status: Abnormal   Collection Time: 07/27/20  6:01 AM   Result Value Ref Range   Sodium 132 (L) 135 - 145 mmol/L   Potassium 3.9 3.5 - 5.1 mmol/L   Chloride 103 98 - 111 mmol/L   CO2 22 22 - 32 mmol/L   Glucose, Bld 116 (H) 70 - 99 mg/dL    Comment: Glucose reference range applies only to samples taken after fasting for at least 8 hours.   BUN 26 (H) 6 - 20 mg/dL   Creatinine, Ser 1.65 (H) 0.61 - 1.24 mg/dL   Calcium 8.4 (L) 8.9 - 10.3 mg/dL   GFR, Estimated 48 (L) >60 mL/min    Comment: (NOTE) Calculated using the CKD-EPI Creatinine Equation (2021)    Anion gap 7 5 - 15    Comment: Performed at [facility], [place] [address] [place], [place] [address]  CBC with Differential/Platelet     Status: Abnormal   Collection Time: 07/27/20  6:01 AM  Result Value Ref Range   WBC 7.7 4.0 - 10.5 K/uL   RBC 3.14 (L) 4.22 - 5.81 MIL/uL   Hemoglobin 8.7 (L) 13.0 - 17.0 g/dL   HCT 27.0 (L) 39.0 - 52.0 %   MCV 86.0 80.0 - 100.0 fL   MCH 27.7 26.0 - 34.0 pg   MCHC 32.2 30.0 - 36.0 g/dL   RDW 14.7 11.5 - 15.5 %   Platelets 297 150 - 400 K/uL   nRBC 0.0 0.0 - 0.2 %   Neutrophils Relative % 60 %   Neutro Abs 4.7 1.7 - 7.7 K/uL   Lymphocytes Relative 23 %   Lymphs Abs 1.8 0.7 - 4.0 K/uL   Monocytes Relative 13 %   Monocytes Absolute 1.0 0.1 - 1.0 K/uL   Eosinophils Relative 3 %   Eosinophils Absolute 0.2 0.0 - 0.5 K/uL   Basophils Relative 1 %   Basophils Absolute 0.0 0.0 - 0.1 K/uL   Immature Granulocytes 0 %   Abs Immature Granulocytes 0.03 0.00 - 0.07 K/uL    Comment: Performed at [facility], [place] [address], [place], [place] [address]  Magnesium     Status: None   Collection Time: 07/27/20  6:01 AM  Result Value Ref Range   Magnesium 1.9 1.7 - 2.4 mg/dL    Comment: Performed at [facility], [place] [address] [place], [place] [address]  Lipid panel     Status: Abnormal   Collection Time: 07/27/20  6:01 AM  Result Value Ref Range   Cholesterol 152 0 - 200 mg/dL   Triglycerides 109  <150 mg/dL   HDL 36 (L) >40 mg/dL   Total CHOL/HDL Ratio 4.2 RATIO   VLDL 22  0 - 40 mg/dL   LDL Cholesterol 94 0 - 99 mg/dL    Comment:        Total Cholesterol/HDL:CHD Risk Coronary Heart Disease Risk Table                     Men   Women  1/2 Average Risk   3.4   3.3  Average Risk       5.0   4.4  2 X Average Risk   9.6   7.1  3 X Average Risk  23.4   11.0        Use the calculated Patient Ratio above and the CHD Risk Table to determine the patient's CHD Risk.        ATP III CLASSIFICATION (LDL):  <100     mg/dL   Optimal  100-129  mg/dL   Near or Above                    Optimal  130-159  mg/dL   Borderline  160-189  mg/dL   High  >190     mg/dL   Very High Performed at [place] [address], [place], [place] [address]   Glucose, capillary     Status: Abnormal   Collection Time: 07/27/20  7:39 AM  Result Value Ref Range   Glucose-Capillary 110 (H) 70 - 99 mg/dL    Comment: Glucose reference range applies only to samples taken after fasting for at least 8 hours.  Glucose, capillary     Status: Abnormal   Collection Time: 07/27/20 11:23 AM  Result Value Ref Range   Glucose-Capillary 124 (H) 70 - 99 mg/dL    Comment: Glucose reference range applies only to samples taken after fasting for at least 8 hours.  Glucose, capillary     Status: Abnormal   Collection Time: 07/27/20  1:16 PM  Result Value Ref Range   Glucose-Capillary 122 (H) 70 - 99 mg/dL    Comment: Glucose reference range applies only to samples taken after fasting for at least 8 hours.  Hemoglobin and hematocrit, blood     Status: Abnormal   Collection Time: 07/27/20  4:46 PM  Result Value Ref Range   Hemoglobin 9.3 (L) 13.0 - 17.0 g/dL   HCT 29.3 (L) 39.0 - 52.0 %    Comment: Performed at [facility], [place] [address], [place],  [address]  CEA     Status: Abnormal   Collection Time: 07/27/20  4:46 PM  Result Value Ref Range   CEA 17.3 (H) 0.0 - 4.7 ng/mL     Comment: (NOTE)                             Nonsmokers          <3.9                             Smokers             <5.6 [employer] Electrochemiluminescence Immunoassay (ECLIA) Values obtained with different assay methods or kits cannot be used interchangeably.  Results cannot be interpreted as absolute evidence of the presence or absence of malignant disease. Performed At: [facility] [place], [place] [id number] Rush Farmer MD W[place]:[id number]   Glucose, capillary     Status: Abnormal   Collection Time:  07/27/20  4:46 PM  Result Value Ref Range   Glucose-Capillary 181 (H) 70 - 99 mg/dL    Comment: Glucose reference range applies only to samples taken after fasting for at least 8 hours.  Glucose, capillary     Status: Abnormal   Collection Time: 07/27/20  8:40 PM  Result Value Ref Range   Glucose-Capillary 177 (H) 70 - 99 mg/dL    Comment: Glucose reference range applies only to samples taken after fasting for at least 8 hours.  Glucose, capillary     Status: Abnormal   Collection Time: 07/28/20 12:02 AM  Result Value Ref Range   Glucose-Capillary 192 (H) 70 - 99 mg/dL    Comment: Glucose reference range applies only to samples taken after fasting for at least 8 hours.  Glucose, capillary     Status: Abnormal   Collection Time: 07/28/20  4:24 AM  Result Value Ref Range   Glucose-Capillary 154 (H) 70 - 99 mg/dL    Comment: Glucose reference range applies only to samples taken after fasting for at least 8 hours.  Basic metabolic panel     Status: Abnormal   Collection Time: 07/28/20  5:05 AM  Result Value Ref Range   Sodium 132 (L) 135 - 145 mmol/L   Potassium 4.4 3.5 - 5.1 mmol/L   Chloride 102 98 - 111 mmol/L   CO2 23 22 - 32 mmol/L   Glucose, Bld 150 (H) 70 - 99 mg/dL    Comment: Glucose reference range applies only to samples taken after fasting for at least 8 hours.   BUN 29 (H) 6 - 20 mg/dL   Creatinine, Ser 1.85 (H) 0.61 - 1.24 mg/dL    Calcium 8.8 (L) 8.9 - 10.3 mg/dL   GFR, Estimated 42 (L) >60 mL/min    Comment: (NOTE) Calculated using the CKD-EPI Creatinine Equation (2021)    Anion gap 7 5 - 15    Comment: Performed at [facility], [place] [address] [place], [place] [address]  CBC     Status: Abnormal   Collection Time: 07/28/20  5:05 AM  Result Value Ref Range   WBC 8.4 4.0 - 10.5 K/uL   RBC 3.08 (L) 4.22 - 5.81 MIL/uL   Hemoglobin 8.4 (L) 13.0 - 17.0 g/dL   HCT 27.0 (L) 39.0 - 52.0 %   MCV 87.7 80.0 - 100.0 fL   MCH 27.3 26.0 - 34.0 pg   MCHC 31.1 30.0 - 36.0 g/dL   RDW 15.0 11.5 - 15.5 %   Platelets 302 150 - 400 K/uL   nRBC 0.0 0.0 - 0.2 %    Comment: Performed at [facility], [place] [address] [place], [place] [address]  Glucose, capillary     Status: Abnormal   Collection Time: 07/28/20  7:51 AM  Result Value Ref Range   Glucose-Capillary 124 (H) 70 - 99 mg/dL    Comment: Glucose reference range applies only to samples taken after fasting for at least 8 hours.   CT CHEST W CONTRAST  Result Date: 07/28/2020 CLINICAL DATA:  Colorectal cancer.  Rectal bleeding EXAM: CT CHEST, ABDOMEN, AND PELVIS WITH CONTRAST TECHNIQUE: Multidetector CT imaging of the chest, abdomen and pelvis was performed following the standard protocol during bolus administration of intravenous contrast. CONTRAST:  160mL OMNIPAQUE IOHEXOL 300 MG/ML  SOLN COMPARISON:  None available FINDINGS: CT CHEST FINDINGS Cardiovascular: No significant vascular findings. Normal heart size. No pericardial effusion. Mediastinum/Nodes: No axillary or supraclavicular adenopathy. No  mediastinal or hilar adenopathy. No pericardial fluid. Esophagus normal. Lungs/Pleura: Smudgy nodule in the LEFT lung apex measures 3 mm on image 36/7). Linear scarring at the RIGHT lung apex with a nodular focus measuring 3 mm on image 25/7. Subpleural nodule in the RIGHT upper lobe on image 80/7. Small bilateral pleural effusions with passive  atelectasis lung bases. Musculoskeletal: No aggressive osseous lesion. CT ABDOMEN AND PELVIS FINDINGS Hepatobiliary: No focal hepatic lesion. No biliary ductal dilatation. Gallbladder is normal. Common bile duct is normal. Pancreas: Pancreas is normal. No ductal dilatation. No pancreatic inflammation. Spleen: Normal spleen Adrenals/urinary tract: Adrenal glands normal. Low-density lesion in the RIGHT kidney is favored benign. Ureters and bladder normal Stomach/Bowel: Small hiatal hernia. Stomach duodenum and small bowel normal oral contrast transits to the ascending colon. The neck is normal. Ascending transverse colon normal. In the distal transverse colon there is a segment of circumferential narrowing measuring 2 cm image 67/2. There is collapse of the colon through the splenic flexure. Asymmetric mucosal thickening in the descending proximal descending colon (image 73/2). Distal descending colon and rectosigmoid colon normal. No evidence abnormal lymph nodes within the colon mesentery. No retroperitoneal nodes. Vascular/Lymphatic: Abdominal aorta is normal caliber. There is no retroperitoneal or periportal lymphadenopathy. No pelvic lymphadenopathy. Reproductive: Prostate normal Other: No peritoneal disease Musculoskeletal: No aggressive osseous lesion. IMPRESSION: Chest Impression: 1. Three small pulmonary nodules less than 5 mm. Favor benign nodules. Recommend correlation with tumor surgical pathology. Recommend attention on follow-up. 2. No mediastinal adenopathy. Abdomen / Pelvis Impression: 1. Circumferential luminal narrowing in the distal transverse colon is nonspecific but may represent the malignancy cyst identified on colonoscopy in the LEFT colon. Asymmetric thickening within the descending colon is also noted. 2. No evidence of metastatic adenopathy in the mesentery porta hepatis. 3. No liver metastasis. Electronically Signed   By: Suzy Bouchard M.D.   On: 07/28/2020 08:54   US RENAL  Result  Date: 07/26/2020 CLINICAL DATA:  Acute kidney injury. EXAM: RENAL / URINARY TRACT ULTRASOUND COMPLETE COMPARISON:  None. FINDINGS: Right Kidney: Renal measurements: 11.8 x 5.8 x 5.9 cm = volume: 211 mL. Parenchymal echogenicity within normal limits. There is a 2.1 x 2.3 x 1.7 cm cyst in the mid kidney. No solid mass or hydronephrosis visualized. Left Kidney: Renal measurements: 12.0 x 7.4 x 4.9 cm = volume: 229 mL. Parenchymal echogenicity within normal limits. No mass or hydronephrosis visualized. Bladder: Mild circumferential bladder wall thickening of 6 mm. Both ureteral jets are seen. Other: Bilateral pleural effusions. IMPRESSION: 1. Lateral thickening up to 6 mm. Recommend correlation with urinalysis. 2. Simple right renal cyst. Otherwise unremarkable sonographic appearance of the kidneys. 3. Bilateral pleural effusions. Electronically Signed   By: Keith Rake M.D.   On: 07/26/2020 18:06   . sodium chloride       Assessment/Plan Acute DCHF Type II diabetes Left transmetatarsal amputation  CKD Dehydration  Descending colon Mass  BBC:[id number] healthy carb Mod ID:  None DVT:  SCD's  Plan:  Path pending, CEA 17.3.  Will review with [name] Done and discuss left colon resection.  He will need clearance from cardiology, and Medicine for surgery.     Earnstine Regal [facility] Surgery 07/28/2020, 10:44 AM Please see Amion for pager number during day hours 7:00am-4:30pm

## 2020-07-28 NOTE — Progress Notes (Signed)
Progress Note  Patient Name: Jeffrey Young Date of Encounter: 07/28/2020  [facility] Cardiologist: Skeet Latch, MD   Subjective   He is frustrated about having so many blood draws and not getting any sleep.  He denies any chest pain or palpitations.  Denies shortness of breath  Inpatient Medications    Scheduled Meds: . carvedilol  3.125 mg Oral BID WC  . fluticasone  2 spray Each Nare Daily  . hydrALAZINE  25 mg Oral Q8H  . insulin aspart  0-9 Units Subcutaneous Q4H  . loratadine  10 mg Oral Daily  . sodium chloride flush  3 mL Intravenous Q12H   Continuous Infusions: . sodium chloride     PRN Meds: sodium chloride, acetaminophen, labetalol, ondansetron (ZOFRAN) IV, sodium chloride flush   Vital Signs    Vitals:   07/28/20 0424 07/28/20 0428 07/28/20 0852 07/28/20 1300  BP: (!) 150/86  (!) 157/57 (!) 143/79  Pulse: 79  79 75  Resp: 18   18  Temp:    98.4 F (36.9 C)  TempSrc:    Oral  SpO2: 100%   93%  Weight:  100.1 kg    Height:        Intake/Output Summary (Last 24 hours) at 07/28/2020 1427 Last data filed at 07/28/2020 0900 Gross per 24 hour  Intake 865.26 ml  Output 1500 ml  Net -634.74 ml   Last 3 Weights 07/28/2020 07/27/2020 07/26/2020  Weight (lbs) 220 lb 10.9 oz 192 lb 4.8 oz 225 lb 5 oz  Weight (kg) 100.1 kg 87.227 kg 102.2 kg      Telemetry    Sinus rhythm- Personally Reviewed  ECG    n/a - Personally Reviewed  Physical Exam   VS:  BP (!) 143/79 (BP Location: Left Arm)   Pulse 75   Temp 98.4 F (36.9 C) (Oral)   Resp 18   Ht 6\' 1"  (1.854 m)   Wt 100.1 kg   SpO2 93%   BMI 29.12 kg/m  , BMI Body mass index is 29.12 kg/m. GENERAL:  Well appearing HEENT: Pupils equal round and reactive, fundi not visualized, oral mucosa unremarkable NECK:  No jugular venous distention, waveform within normal limits, carotid upstroke brisk and symmetric, no bruits LUNGS:  Clear to auscultation bilaterally HEART:  RRR.  PMI not displaced or  sustained,S1 and S2 within normal limits, no S3, no S4, no clicks, no rubs, no murmurs ABD:  Flat, positive bowel sounds normal in frequency in pitch, no bruits, no rebound, no guarding, no midline pulsatile mass, no hepatomegaly, no splenomegaly EXT:  2 plus pulses throughout, 2+ LE edema, no cyanosis no clubbing.  Left transmetatarsal amputation SKIN:  No rashes no nodules NEURO:  Cranial nerves II through XII grossly intact, motor grossly intact throughout PSYCH:  Cognitively intact, oriented to person place and time   Labs    High Sensitivity Troponin:  No results for input(s): TROPONINIHS in the last 720 hours.    Chemistry Recent Labs  Lab 07/24/20 2125 07/25/20 1044 07/26/20 0514 07/27/20 0601 07/28/20 0505  NA 136   < > 133* 132* 132*  K 4.3   < > 3.7 3.9 4.4  CL 107   < > 101 103 102  CO2 20*   < > 24 22 23   GLUCOSE 208*   < > 118* 116* 150*  BUN 32*   < > 28* 26* 29*  CREATININE 1.64*   < > 1.95* 1.65* 1.85*  CALCIUM 8.5*   < >  8.6* 8.4* 8.8*  PROT 6.8  --   --   --   --   ALBUMIN 3.3*  --   --   --   --   AST 26  --   --   --   --   ALT 19  --   --   --   --   ALKPHOS 118  --   --   --   --   BILITOT 0.8  --   --   --   --   GFRNONAA 48*   < > 39* 48* 42*  ANIONGAP 9   < > 8 7 7    < > = values in this interval not displayed.     Hematology Recent Labs  Lab 07/26/20 0514 07/26/20 1517 07/27/20 0601 07/27/20 1646 07/28/20 0505  WBC 7.6  --  7.7  --  8.4  RBC 3.04*  --  3.14*  --  3.08*  HGB 8.3*   < > 8.7* 9.3* 8.4*  HCT 27.4*   < > 27.0* 29.3* 27.0*  MCV 90.1  --  86.0  --  87.7  MCH 27.3  --  27.7  --  27.3  MCHC 30.3  --  32.2  --  31.1  RDW 15.1  --  14.7  --  15.0  PLT 292  --  297  --  302   < > = values in this interval not displayed.    BNP Recent Labs  Lab 07/24/20 2125  BNP 210.2*     DDimer No results for input(s): DDIMER in the last 168 hours.   Radiology    CT CHEST W CONTRAST  Result Date: 07/28/2020 CLINICAL DATA:   Colorectal cancer.  Rectal bleeding EXAM: CT CHEST, ABDOMEN, AND PELVIS WITH CONTRAST TECHNIQUE: Multidetector CT imaging of the chest, abdomen and pelvis was performed following the standard protocol during bolus administration of intravenous contrast. CONTRAST:  135mL OMNIPAQUE IOHEXOL 300 MG/ML  SOLN COMPARISON:  None available FINDINGS: CT CHEST FINDINGS Cardiovascular: No significant vascular findings. Normal heart size. No pericardial effusion. Mediastinum/Nodes: No axillary or supraclavicular adenopathy. No mediastinal or hilar adenopathy. No pericardial fluid. Esophagus normal. Lungs/Pleura: Smudgy nodule in the LEFT lung apex measures 3 mm on image 36/7). Linear scarring at the RIGHT lung apex with a nodular focus measuring 3 mm on image 25/7. Subpleural nodule in the RIGHT upper lobe on image 80/7. Small bilateral pleural effusions with passive atelectasis lung bases. Musculoskeletal: No aggressive osseous lesion. CT ABDOMEN AND PELVIS FINDINGS Hepatobiliary: No focal hepatic lesion. No biliary ductal dilatation. Gallbladder is normal. Common bile duct is normal. Pancreas: Pancreas is normal. No ductal dilatation. No pancreatic inflammation. Spleen: Normal spleen Adrenals/urinary tract: Adrenal glands normal. Low-density lesion in the RIGHT kidney is favored benign. Ureters and bladder normal Stomach/Bowel: Small hiatal hernia. Stomach duodenum and small bowel normal oral contrast transits to the ascending colon. The neck is normal. Ascending transverse colon normal. In the distal transverse colon there is a segment of circumferential narrowing measuring 2 cm image 67/2. There is collapse of the colon through the splenic flexure. Asymmetric mucosal thickening in the descending proximal descending colon (image 73/2). Distal descending colon and rectosigmoid colon normal. No evidence abnormal lymph nodes within the colon mesentery. No retroperitoneal nodes. Vascular/Lymphatic: Abdominal aorta is normal  caliber. There is no retroperitoneal or periportal lymphadenopathy. No pelvic lymphadenopathy. Reproductive: Prostate normal Other: No peritoneal disease Musculoskeletal: No aggressive osseous lesion. IMPRESSION: Chest Impression: 1. Three  small pulmonary nodules less than 5 mm. Favor benign nodules. Recommend correlation with tumor surgical pathology. Recommend attention on follow-up. 2. No mediastinal adenopathy. Abdomen / Pelvis Impression: 1. Circumferential luminal narrowing in the distal transverse colon is nonspecific but may represent the malignancy cyst identified on colonoscopy in the LEFT colon. Asymmetric thickening within the descending colon is also noted. 2. No evidence of metastatic adenopathy in the mesentery porta hepatis. 3. No liver metastasis. Electronically Signed   By: Suzy Bouchard M.D.   On: 07/28/2020 08:54   US RENAL  Result Date: 07/26/2020 CLINICAL DATA:  Acute kidney injury. EXAM: RENAL / URINARY TRACT ULTRASOUND COMPLETE COMPARISON:  None. FINDINGS: Right Kidney: Renal measurements: 11.8 x 5.8 x 5.9 cm = volume: 211 mL. Parenchymal echogenicity within normal limits. There is a 2.1 x 2.3 x 1.7 cm cyst in the mid kidney. No solid mass or hydronephrosis visualized. Left Kidney: Renal measurements: 12.0 x 7.4 x 4.9 cm = volume: 229 mL. Parenchymal echogenicity within normal limits. No mass or hydronephrosis visualized. Bladder: Mild circumferential bladder wall thickening of 6 mm. Both ureteral jets are seen. Other: Bilateral pleural effusions. IMPRESSION: 1. Lateral thickening up to 6 mm. Recommend correlation with urinalysis. 2. Simple right renal cyst. Otherwise unremarkable sonographic appearance of the kidneys. 3. Bilateral pleural effusions. Electronically Signed   By: Keith Rake M.D.   On: 07/26/2020 18:06    Cardiac Studies   Echo 07/25/20: 1. Left ventricular ejection fraction, by estimation, is 55 to 60%. The  left ventricle has normal function. The left  ventricle has no regional  wall motion abnormalities. There is mild left ventricular hypertrophy.  Left ventricular diastolic parameters  are indeterminate.  2. Right ventricular systolic function is normal. The right ventricular  size is normal. Tricuspid regurgitation signal is inadequate for assessing  PA pressure.  3. There is a trivial pericardial effusion anterior to the right  ventricle.  4. The mitral valve is grossly normal. Mild mitral valve regurgitation.  5. The aortic valve is tricuspid. Aortic valve regurgitation is not  visualized.  6. The inferior vena cava is dilated in size with >50% respiratory  variability, suggesting right atrial pressure of 8 mmHg.   Patient Profile     55 y.o. male with diabetes and prior L transmetatarsal amputation admitted with shortness of breath.  He was found to have acute diastolic heart failure.  Echo this admission revealed LVEF 55 to 60% with mild LVH.    Assessment & Plan    # Acute diastolic heart failure: # Essential hypertension: Jeffrey Young presented volume overloaded and short of breath in the setting of worsening anemia.  Hemoglobin on admission was 6.0.  BNP was 210.  Echo has revealed normal systolic function.  Right atrial pressure was 8 mmHg.  He was initially diuresed with improvement in his symptoms.  However furosemide was then held in the setting of worsening renal function.  His weight today is 100.1 kg, up from 99.8 kg on admission.  BP control is reasonable.  Continue carvedilol an hydralazine. Will place compression socks and check a BNP.  Patient complains of many missed attempts at blood draws and pain.  Will place a PICC.  # Acute on chronic anemia: # Colonic mass: He was found to have a polyp in 2020.  Due to Covid-19 this was not followed up until this admission.  On colonoscopy 3/8 there is a large, fungating infiltrative and ulcerated nonobstructing mass in the proximal descending colon.  Biopsies  were taken.   Per the patient he was told he needs to go to [facility] to have a robotic surgery.  This is not yet clarified in the note.  Will discuss with the surgical team.  CEA was elevated to 17.3.  # Preoperative risk assessment: Mr. Alvira has shortness of breath on admission.  This was likely attributable to his volume overload and anemia.  He has not experienced any chest pain and has been fairly active at baseline.  Given his anemia and need for urgent resection of his likely malignant colonic mass, would not recommend ischemia evaluation, or particularly cardiac cath and stent placement, as this would necessitate dual antiplatelet therapy.  His main operative risk is volume shifts and heart failure exacerbations.  Would attempt to minimize fluid boluses and we will assist with managing his hemodynamics/volume perioperatively.      For questions or updates, please contact [place] Please consult [email or web address] for contact info under        Signed, Skeet Latch, MD  07/28/2020, 2:27 PM

## 2020-07-28 NOTE — Progress Notes (Signed)
IV team consulted for pt to have PICC access. Explained to patient regarding  PICC placement but patient refused at this time. Lt Midline placed for IV access and lab draws. RN aware.

## 2020-07-28 NOTE — Progress Notes (Addendum)
Subjective: He complains that his arms are hurting.  It is from too many blood draw attempts.  It kept him up all night.  Objective: Vital signs in last 24 hours: Temp:  [98.4 F (36.9 C)-98.8 F (37.1 C)] 98.4 F (36.9 C) (03/08 2043) Pulse Rate:  [67-83] 79 (03/09 0424) Resp:  [14-24] 18 (03/09 0424) BP: (104-180)/(48-94) 150/86 (03/09 0424) SpO2:  [93 %-100 %] 100 % (03/09 0424) Weight:  [100.1 kg] 100.1 kg (03/09 0428) Last BM Date: 07/27/20  Intake/Output from previous day: 03/08 0701 - 03/09 0700 In: 1034.7 [P.O.:600; I.V.:434.7] Out: 1502 [Urine:1502] Intake/Output this shift: No intake/output data recorded.  General appearance: alert and no distress GI: soft, non-tender; bowel sounds normal; no masses,  no organomegaly  Lab Results: Recent Labs    07/26/20 0514 07/26/20 1517 07/27/20 0601 07/27/20 1646 07/28/20 0505  WBC 7.6  --  7.7  --  8.4  HGB 8.3*   < > 8.7* 9.3* 8.4*  HCT 27.4*   < > 27.0* 29.3* 27.0*  PLT 292  --  297  --  302   < > = values in this interval not displayed.   BMET Recent Labs    07/26/20 0514 07/27/20 0601 07/28/20 0505  NA 133* 132* 132*  K 3.7 3.9 4.4  CL 101 103 102  CO2 24 22 23   GLUCOSE 118* 116* 150*  BUN 28* 26* 29*  CREATININE 1.95* 1.65* 1.85*  CALCIUM 8.6* 8.4* 8.8*   LFT No results for input(s): PROT, ALBUMIN, AST, ALT, ALKPHOS, BILITOT, BILIDIR, IBILI in the last 72 hours. PT/INR No results for input(s): LABPROT, INR in the last 72 hours. Hepatitis Panel No results for input(s): HEPBSAG, HCVAB, HEPAIGM, HEPBIGM in the last 72 hours. C-Diff No results for input(s): CDIFFTOX in the last 72 hours. Fecal Lactopherrin No results for input(s): FECLLACTOFRN in the last 72 hours.  Studies/Results: US RENAL  Result Date: 07/26/2020 CLINICAL DATA:  Acute kidney injury. EXAM: RENAL / URINARY TRACT ULTRASOUND COMPLETE COMPARISON:  None. FINDINGS: Right Kidney: Renal measurements: 11.8 x 5.8 x 5.9 cm = volume: 211 mL.  Parenchymal echogenicity within normal limits. There is a 2.1 x 2.3 x 1.7 cm cyst in the mid kidney. No solid mass or hydronephrosis visualized. Left Kidney: Renal measurements: 12.0 x 7.4 x 4.9 cm = volume: 229 mL. Parenchymal echogenicity within normal limits. No mass or hydronephrosis visualized. Bladder: Mild circumferential bladder wall thickening of 6 mm. Both ureteral jets are seen. Other: Bilateral pleural effusions. IMPRESSION: 1. Lateral thickening up to 6 mm. Recommend correlation with urinalysis. 2. Simple right renal cyst. Otherwise unremarkable sonographic appearance of the kidneys. 3. Bilateral pleural effusions. Electronically Signed   By: Keith Rake M.D.   On: 07/26/2020 18:06    Medications:  Scheduled: . carvedilol  3.125 mg Oral BID WC  . fluticasone  2 spray Each Nare Daily  . hydrALAZINE  25 mg Oral Q8H  . insulin aspart  0-9 Units Subcutaneous Q4H  . loratadine  10 mg Oral Daily  . sodium chloride flush  3 mL Intravenous Q12H   Continuous: . 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  Grossly consistent with this issue. 2) New onset CHF. 3) Anemia.   The patient is stable.  His staging CT scans were performed, but the official reading is still pending.  Plan: 1) Await reading of the CT scans. 2) Minimize blood draws.  ? Every other day.  ADDENDUM: The CT scans do not show any abdominal mets.  There are three small pulmonary nodules, but it is favored to be benign.  However, follow up is recommended.  Surgical and Med Onc consults called.  LOS: 4 days   Ranelle Auker D 07/28/2020, 7:56 AM

## 2020-07-29 ENCOUNTER — Encounter (HOSPITAL_COMMUNITY): Payer: Self-pay | Admitting: Internal Medicine

## 2020-07-29 DIAGNOSIS — I509 Heart failure, unspecified: Secondary | ICD-10-CM

## 2020-07-29 DIAGNOSIS — N179 Acute kidney failure, unspecified: Secondary | ICD-10-CM

## 2020-07-29 DIAGNOSIS — D500 Iron deficiency anemia secondary to blood loss (chronic): Secondary | ICD-10-CM

## 2020-07-29 DIAGNOSIS — K922 Gastrointestinal hemorrhage, unspecified: Secondary | ICD-10-CM | POA: Diagnosis not present

## 2020-07-29 DIAGNOSIS — C185 Malignant neoplasm of splenic flexure: Secondary | ICD-10-CM

## 2020-07-29 LAB — BASIC METABOLIC PANEL
Anion gap: 6 (ref 5–15)
BUN: 33 mg/dL — ABNORMAL HIGH (ref 6–20)
CO2: 24 mmol/L (ref 22–32)
Calcium: 8.6 mg/dL — ABNORMAL LOW (ref 8.9–10.3)
Chloride: 102 mmol/L (ref 98–111)
Creatinine, Ser: 2.12 mg/dL — ABNORMAL HIGH (ref 0.61–1.24)
GFR, Estimated: 36 mL/min — ABNORMAL LOW (ref >60)
Glucose, Bld: 139 mg/dL — ABNORMAL HIGH (ref 70–99)
Potassium: 3.9 mmol/L (ref 3.5–5.1)
Sodium: 132 mmol/L — ABNORMAL LOW (ref 135–145)

## 2020-07-29 LAB — CBC
HCT: 25.3 % — ABNORMAL LOW (ref 39.0–52.0)
Hemoglobin: 8 g/dL — ABNORMAL LOW (ref 13.0–17.0)
MCH: 27.4 pg (ref 26.0–34.0)
MCHC: 31.6 g/dL (ref 30.0–36.0)
MCV: 86.6 fL (ref 80.0–100.0)
Platelets: 270 10*3/uL (ref 150–400)
RBC: 2.92 MIL/uL — ABNORMAL LOW (ref 4.22–5.81)
RDW: 15 % (ref 11.5–15.5)
WBC: 7.3 10*3/uL (ref 4.0–10.5)
nRBC: 0 % (ref 0.0–0.2)

## 2020-07-29 LAB — GLUCOSE, CAPILLARY
Glucose-Capillary: 119 mg/dL — ABNORMAL HIGH (ref 70–99)
Glucose-Capillary: 138 mg/dL — ABNORMAL HIGH (ref 70–99)
Glucose-Capillary: 163 mg/dL — ABNORMAL HIGH (ref 70–99)
Glucose-Capillary: 172 mg/dL — ABNORMAL HIGH (ref 70–99)
Glucose-Capillary: 275 mg/dL — ABNORMAL HIGH (ref 70–99)
Glucose-Capillary: 54 mg/dL — ABNORMAL LOW (ref 70–99)
Glucose-Capillary: 60 mg/dL — ABNORMAL LOW (ref 70–99)
Glucose-Capillary: 74 mg/dL (ref 70–99)

## 2020-07-29 LAB — SURGICAL PATHOLOGY

## 2020-07-29 LAB — HEMOGLOBIN A1C
Hgb A1c MFr Bld: 6.7 % — ABNORMAL HIGH (ref 4.8–5.6)
Mean Plasma Glucose: 145.59 mg/dL

## 2020-07-29 MED ORDER — POLYETHYLENE GLYCOL 3350 17 GM/SCOOP PO POWD
1.0000 | Freq: Once | ORAL | Status: DC
  Filled 2020-07-29: qty 255

## 2020-07-29 MED ORDER — ALVIMOPAN 12 MG PO CAPS
12.0000 mg | ORAL_CAPSULE | ORAL | Status: AC
  Administered 2020-07-30: 12 mg via ORAL
  Filled 2020-07-29: qty 1

## 2020-07-29 MED ORDER — METRONIDAZOLE 500 MG PO TABS
1000.0000 mg | ORAL_TABLET | ORAL | Status: AC
  Administered 2020-07-29 (×3): 1000 mg via ORAL
  Filled 2020-07-29 (×3): qty 2

## 2020-07-29 MED ORDER — SODIUM CHLORIDE 0.9 % IV SOLN
2.0000 g | INTRAVENOUS | Status: AC
  Administered 2020-07-30: 2 g via INTRAVENOUS
  Filled 2020-07-29: qty 2

## 2020-07-29 MED ORDER — ENSURE PRE-SURGERY PO LIQD
592.0000 mL | Freq: Once | ORAL | Status: AC
  Administered 2020-07-29: 592 mL via ORAL
  Filled 2020-07-29: qty 592

## 2020-07-29 MED ORDER — CHLORHEXIDINE GLUCONATE CLOTH 2 % EX PADS
6.0000 | MEDICATED_PAD | Freq: Once | CUTANEOUS | Status: AC
  Administered 2020-07-29: 6 via TOPICAL

## 2020-07-29 MED ORDER — ENSURE PRE-SURGERY PO LIQD
296.0000 mL | Freq: Once | ORAL | Status: DC
  Filled 2020-07-29: qty 296

## 2020-07-29 MED ORDER — NEOMYCIN SULFATE 500 MG PO TABS
1000.0000 mg | ORAL_TABLET | ORAL | Status: AC
  Administered 2020-07-29 (×3): 1000 mg via ORAL
  Filled 2020-07-29 (×3): qty 2

## 2020-07-29 MED ORDER — HEPARIN SODIUM (PORCINE) 5000 UNIT/ML IJ SOLN
5000.0000 [IU] | INTRAMUSCULAR | Status: AC
  Administered 2020-07-30: 5000 [IU] via SUBCUTANEOUS
  Filled 2020-07-29: qty 1

## 2020-07-29 MED ORDER — BISACODYL 5 MG PO TBEC
20.0000 mg | DELAYED_RELEASE_TABLET | Freq: Once | ORAL | Status: AC
  Administered 2020-07-29: 20 mg via ORAL
  Filled 2020-07-29: qty 4

## 2020-07-29 NOTE — Progress Notes (Signed)
PROGRESS NOTE    Jeffrey Young  OZD:[id number] DOB: 1965-06-13 DOA: 07/24/2020 PCP: [name], MD   Brief Narrative:  Pleasant 55 year old gentleman history of type 2 diabetes, history of left transmetatarsal amputation of the foot, history of colonic polyps including 30 mm polyp which was unable to be resected on colonoscopy January 2020.  Patient was referred to [facility] for resection however all elective procedures were canceled at that time due to the Covid pandemic.  Patient with no prior history of heart failure. Patient presented to the ED with complaints of lightheadedness noted to have a blood glucose level of 34, 5-day history of exertional dyspnea, worsening lower extremity edema, abdominal swelling, cough all which are new.  Patient seen in the ED noted to have systolic blood pressures in the 160s, chest x-ray consistent with pulmonary edema, elevated BNP, elevated creatinine of 1.6, hemoglobin of 6.0.  Patient denied any melanotic stools, hematochezia, hematemesis.  FOBT noted to be positive. 2 units of packed red blood cells ordered for transfusion. GI consulted.   Assessment & Plan:   Principal Problem:   New onset of congestive heart failure (HCC) Active Problems:   DM2 (diabetes mellitus, type 2) (HCC)   Anemia   GIB (gastrointestinal bleeding)   ARF (acute renal failure) (HCC)   Symptomatic anemia   Hypertension  New onset acute diastolic CHF, POA - Echo EF 55-60% - indeterminate diastolic dysfunction - Initially IV Lasix early on during the hospitalization with good urine output; but held in anticipation of colonoscopy and increased creatinine.   -Clinical/symptomatic improvement. -Creatinine minimally elevated in the setting of diuresis, follow repeat labs, Lasix held Lab Results  Component Value Date   CREATININE 2.12 (H) 07/29/2020   CREATININE 1.85 (H) 07/28/2020   CREATININE 1.65 (H) 07/27/2020  - Continue Coreg and hydralazine. - Unable to place on the ACE  inhibitor at this time due to renal function.  - Transfuse to keep hemoglobin > 8. - Fasting lipid panel with LDL of 94.  - Cardiology consulted and following -appreciate insight and recommendations  Acute symptomatic anemia/GI bleed Likely subacute on chronic slow GI bleed.  Patient denies any significant melanotic stools or hematochezia.  FOBT positive.  Hemoglobin noted to be 6.0 on admission.  Patient transfused total of 3 units packed red blood cells with hemoglobin currently 8.7.   -Patient noted to have had a 30 mm descending colonic polyp in January 2020 that was never removed as all elective surgeries were canceled in January 2020 due to Covid pandemic as patient was referred to [facility]. -Patient seen in consultation by GI and patient for colonoscopy today for further evaluation -Surgery following, ultimate plan for resection in the next 24 to 72 hours pending clinical course, OR availability and staffing.     Component Value Date/Time   HGB 8.0 (L) 07/29/2020 0535   HCT 25.3 (L) 07/29/2020 0535   Diabetes mellitus type 2, uncontrolled Patient noted to have had a hypoglycemic spell on admission with a CBG of 34.  Hemoglobin A1c 7.2.  CBG 113.  Patient currently NPO.  Continue to hold Lantus.  Placed on D5 normal saline at 10 cc/h.  Continue CBG every 4 hours.  SSI.  Follow. Lab Results  Component Value Date   HGBA1C 7.2 (H) 07/25/2020   Acute kidney injury, ongoing - Secondary to above/diuresis - continue to hold diuretics - Follow UOP - creatinine somewhat labile over the past 48h as above.   Unspecified colonic mass - Surgery following -  appreciate insight and recommendations - Patient remains moderate risk patient for moderate risk procedure. Cardiology following as well - volume status likely main concern given above. Will attempt to optimize patient along with cardiology in regards to volume status and creatinine perioperatively.  Hypertension Better controlled on current  regimen of Coreg, hydralazine.  Cardiology following.  DVT prophylaxis: SCDs Code Status: Full Family Communication: Updated patient Disposition:   Status is: Inpatient    Dispo: The patient is from: Home              Anticipated d/c is to: Home pending further surgical evaluation and treatment.              Patient status post transfusion 3 units packed red blood cells for anemia.  Anemia work-up underway.  Patient for colonoscopy today.  Patient with new onset CHF.  Cardiology following.  Not stable for discharge.     Difficult to place patient: No   Consultants:   Gastroenterology: [name] 07/25/2020  Cardiology: [name] 07/26/2020  Procedures:   Transfusion 3 units packed red blood cells 07/25/2020  2D echo 07/25/2020  Chest x-ray 07/24/2020  Colonoscopy pending 07/27/2020  Renal ultrasound 07/26/2020  Antimicrobials:   None indicated  Subjective: No acute issues/events overnight, denies any melena, hematochezia, abdominal pain or fatigue.  Somewhat anxious about possible procedure but otherwise in good spirits.  Objective: Vitals:   07/28/20 2024 07/28/20 2250 07/29/20 0428 07/29/20 0500  BP: (!) 176/94 (!) 143/91 (!) 170/89   Pulse: 77 80 77   Resp: 18  18   Temp: 98.3 F (36.8 C)  99.5 F (37.5 C)   TempSrc: Oral  Oral   SpO2: 96%  97%   Weight:    101.6 kg  Height:        Intake/Output Summary (Last 24 hours) at 07/29/2020 0816 Last data filed at 07/28/2020 2300 Gross per 24 hour  Intake 960 ml  Output 2500 ml  Net -1540 ml   Filed Weights   07/27/20 0500 07/28/20 0428 07/29/20 0500  Weight: 87.2 kg 100.1 kg 101.6 kg    Examination:  General exam: NAD, resting comfortably in bed Respiratory system: Clear to auscultation bilaterally.  No wheezes, no crackles, no rhonchi.  Normal respiratory effort.  Speaking in full sentences.  No use of accessory muscles.   Cardiovascular system: Regular rate rhythm no murmurs rubs or gallops.  No  JVD. Gastrointestinal system: Abdomen is soft, nontender, nondistended, positive bowel sounds.  No rebound.  No guarding.  Central nervous system: Alert and oriented. No focal neurological deficits. Extremities: Left transmetatarsal amputation foot. Scant edema bilaterally Skin: No rashes, lesions or ulcers Psychiatry: Judgement and insight appear normal. Mood & affect appropriate.     Data Reviewed: I have personally reviewed following labs and imaging studies  CBC: Recent Labs  Lab 07/24/20 2125 07/25/20 1044 07/25/20 1700 07/26/20 0514 07/26/20 1517 07/27/20 0601 07/27/20 1646 07/28/20 0505 07/29/20 0535  WBC 7.2 7.1  --  7.6  --  7.7  --  8.4 7.3  NEUTROABS 4.7  --   --  4.5  --  4.7  --   --   --   HGB 6.0* 7.5*   < > 8.3* 8.9* 8.7* 9.3* 8.4* 8.0*  HCT 20.3* 23.5*   < > 27.4* 28.1* 27.0* 29.3* 27.0* 25.3*  MCV 86.4 86.4  --  90.1  --  86.0  --  87.7 86.6  PLT 281 307  --  292  --  297  --  302 270   < > = values in this interval not displayed.    Basic Metabolic Panel: Recent Labs  Lab 07/25/20 1044 07/26/20 0514 07/27/20 0601 07/28/20 0505 07/29/20 0535  NA 135 133* 132* 132* 132*  K 3.8 3.7 3.9 4.4 3.9  CL 104 101 103 102 102  CO2 22 24 22 23 24   GLUCOSE 151* 118* 116* 150* 139*  BUN 26* 28* 26* 29* 33*  CREATININE 1.57* 1.95* 1.65* 1.85* 2.12*  CALCIUM 8.3* 8.6* 8.4* 8.8* 8.6*  MG  --   --  1.9  --   --     GFR: Estimated Creatinine Clearance: 48.2 mL/min (A) (by C-G formula based on SCr of 2.12 mg/dL (H)).  Liver Function Tests: Recent Labs  Lab 07/24/20 2125  AST 26  ALT 19  ALKPHOS 118  BILITOT 0.8  PROT 6.8  ALBUMIN 3.3*    CBG: Recent Labs  Lab 07/28/20 1632 07/28/20 2021 07/28/20 2341 07/29/20 0425 07/29/20 0750  GLUCAP 185* 196* 253* 138* 119*     Recent Results (from the past 240 hour(s))  Culture, Urine     Status: None   Collection Time: 07/25/20  9:31 AM   Specimen: Urine, Clean Catch  Result Value Ref Range Status    Specimen Description   Final    URINE, CLEAN CATCH Performed at [facility], [place] [address] [place], [place] [address]    Special Requests   Final    URINE, RANDOM Performed at [place] [address], [place], [place] [address]    Culture   Final    NO GROWTH Performed at [facility], [place] [address], [place], [place] [address]    Report Status 07/26/2020 FINAL  Final         Radiology Studies: CT CHEST W CONTRAST  Result Date: 07/28/2020 CLINICAL DATA:  Colorectal cancer.  Rectal bleeding EXAM: CT CHEST, ABDOMEN, AND PELVIS WITH CONTRAST TECHNIQUE: Multidetector CT imaging of the chest, abdomen and pelvis was performed following the standard protocol during bolus administration of intravenous contrast. CONTRAST:  179mL OMNIPAQUE IOHEXOL 300 MG/ML  SOLN COMPARISON:  None available FINDINGS: CT CHEST FINDINGS Cardiovascular: No significant vascular findings. Normal heart size. No pericardial effusion. Mediastinum/Nodes: No axillary or supraclavicular adenopathy. No mediastinal or hilar adenopathy. No pericardial fluid. Esophagus normal. Lungs/Pleura: Smudgy nodule in the LEFT lung apex measures 3 mm on image 36/7). Linear scarring at the RIGHT lung apex with a nodular focus measuring 3 mm on image 25/7. Subpleural nodule in the RIGHT upper lobe on image 80/7. Small bilateral pleural effusions with passive atelectasis lung bases. Musculoskeletal: No aggressive osseous lesion. CT ABDOMEN AND PELVIS FINDINGS Hepatobiliary: No focal hepatic lesion. No biliary ductal dilatation. Gallbladder is normal. Common bile duct is normal. Pancreas: Pancreas is normal. No ductal dilatation. No pancreatic inflammation. Spleen: Normal spleen Adrenals/urinary tract: Adrenal glands normal. Low-density lesion in the RIGHT kidney is favored benign. Ureters and bladder normal Stomach/Bowel: Small hiatal hernia. Stomach duodenum and small bowel normal oral contrast  transits to the ascending colon. The neck is normal. Ascending transverse colon normal. In the distal transverse colon there is a segment of circumferential narrowing measuring 2 cm image 67/2. There is collapse of the colon through the splenic flexure. Asymmetric mucosal thickening in the descending proximal descending colon (image 73/2). Distal descending colon and rectosigmoid colon normal. No evidence abnormal lymph nodes within the colon mesentery. No retroperitoneal nodes. Vascular/Lymphatic:  Abdominal aorta is normal caliber. There is no retroperitoneal or periportal lymphadenopathy. No pelvic lymphadenopathy. Reproductive: Prostate normal Other: No peritoneal disease Musculoskeletal: No aggressive osseous lesion. IMPRESSION: Chest Impression: 1. Three small pulmonary nodules less than 5 mm. Favor benign nodules. Recommend correlation with tumor surgical pathology. Recommend attention on follow-up. 2. No mediastinal adenopathy. Abdomen / Pelvis Impression: 1. Circumferential luminal narrowing in the distal transverse colon is nonspecific but may represent the malignancy cyst identified on colonoscopy in the LEFT colon. Asymmetric thickening within the descending colon is also noted. 2. No evidence of metastatic adenopathy in the mesentery porta hepatis. 3. No liver metastasis. Electronically Signed   By: Suzy Bouchard M.D.   On: 07/28/2020 08:54   CT ABDOMEN PELVIS W CONTRAST  Result Date: 07/28/2020 CLINICAL DATA:  Colorectal cancer.  Rectal bleeding EXAM: CT CHEST, ABDOMEN, AND PELVIS WITH CONTRAST TECHNIQUE: Multidetector CT imaging of the chest, abdomen and pelvis was performed following the standard protocol during bolus administration of intravenous contrast. CONTRAST:  132mL OMNIPAQUE IOHEXOL 300 MG/ML  SOLN COMPARISON:  None available FINDINGS: CT CHEST FINDINGS Cardiovascular: No significant vascular findings. Normal heart size. No pericardial effusion. Mediastinum/Nodes: No axillary or  supraclavicular adenopathy. No mediastinal or hilar adenopathy. No pericardial fluid. Esophagus normal. Lungs/Pleura: Smudgy nodule in the LEFT lung apex measures 3 mm on image 36/7). Linear scarring at the RIGHT lung apex with a nodular focus measuring 3 mm on image 25/7. Subpleural nodule in the RIGHT upper lobe on image 80/7. Small bilateral pleural effusions with passive atelectasis lung bases. Musculoskeletal: No aggressive osseous lesion. CT ABDOMEN AND PELVIS FINDINGS Hepatobiliary: No focal hepatic lesion. No biliary ductal dilatation. Gallbladder is normal. Common bile duct is normal. Pancreas: Pancreas is normal. No ductal dilatation. No pancreatic inflammation. Spleen: Normal spleen Adrenals/urinary tract: Adrenal glands normal. Low-density lesion in the RIGHT kidney is favored benign. Ureters and bladder normal Stomach/Bowel: Small hiatal hernia. Stomach duodenum and small bowel normal oral contrast transits to the ascending colon. The neck is normal. Ascending transverse colon normal. In the distal transverse colon there is a segment of circumferential narrowing measuring 2 cm image 67/2. There is collapse of the colon through the splenic flexure. Asymmetric mucosal thickening in the descending proximal descending colon (image 73/2). Distal descending colon and rectosigmoid colon normal. No evidence abnormal lymph nodes within the colon mesentery. No retroperitoneal nodes. Vascular/Lymphatic: Abdominal aorta is normal caliber. There is no retroperitoneal or periportal lymphadenopathy. No pelvic lymphadenopathy. Reproductive: Prostate normal Other: No peritoneal disease Musculoskeletal: No aggressive osseous lesion. IMPRESSION: Chest Impression: 1. Three small pulmonary nodules less than 5 mm. Favor benign nodules. Recommend correlation with tumor surgical pathology. Recommend attention on follow-up. 2. No mediastinal adenopathy. Abdomen / Pelvis Impression: 1. Circumferential luminal narrowing in the  distal transverse colon is nonspecific but may represent the malignancy cyst identified on colonoscopy in the LEFT colon. Asymmetric thickening within the descending colon is also noted. 2. No evidence of metastatic adenopathy in the mesentery porta hepatis. 3. No liver metastasis. Electronically Signed   By: Suzy Bouchard M.D.   On: 07/28/2020 08:54   [place] EKG SITE RITE  Result Date: 07/28/2020 If Site Rite image not attached, placement could not be confirmed due to current cardiac rhythm.       Scheduled Meds: . carvedilol  3.125 mg Oral BID WC  . fluticasone  2 spray Each Nare Daily  . hydrALAZINE  25 mg Oral Q8H  . insulin aspart  0-9 Units Subcutaneous Q4H  .  loratadine  10 mg Oral Daily  . sodium chloride flush  10-40 mL Intracatheter Q12H  . sodium chloride flush  3 mL Intravenous Q12H   Continuous Infusions: . sodium chloride       LOS: 5 days    Time spent: 45 minutes  Little Ishikawa, DO [facility] Pager: Secure chat  07/29/2020, 8:16 AM

## 2020-07-29 NOTE — Progress Notes (Addendum)
[facility] Progress Note  2 Days Post-Op  Subjective: CC: denies abdominal pain, nausea, or emesis. Tolerating PO. Did have some bloody in his BMs when he did bowel prep for colonoscopy but did not have any prior to this.    Objective: Vital signs in last 24 hours: Temp:  [98.3 F (36.8 C)-99.5 F (37.5 C)] 99.5 F (37.5 C) (03/10 0428) Pulse Rate:  [75-80] 80 (03/10 0905) Resp:  [18] 18 (03/10 0428) BP: (143-176)/(70-94) 157/75 (03/10 0905) SpO2:  [93 %-98 %] 97 % (03/10 0905) Weight:  [101.6 kg] 101.6 kg (03/10 0500) Last BM Date: 07/27/20  Intake/Output from previous day: 03/09 0701 - 03/10 0700 In: 960 [P.O.:960] Out: 2500 [Urine:2500] Intake/Output this shift: Total I/O In: 240 [P.O.:240] Out: 400 [Urine:400]  PE: Gen:  Alert, NAD, pleasant Card:  Regular rate and rhythm, pedal pulses 2+ BL Pulm:  Normal effort, clear to auscultation bilaterally Abd: Soft, non-tender, non-distended, bowel sounds present in all 4 quadrants, no HSM Skin: warm and dry, no rashes  Psych: A&Ox3   Lab Results:  Recent Labs    07/28/20 0505 07/29/20 0535  WBC 8.4 7.3  HGB 8.4* 8.0*  HCT 27.0* 25.3*  PLT 302 270   BMET Recent Labs    07/28/20 0505 07/29/20 0535  NA 132* 132*  K 4.4 3.9  CL 102 102  CO2 23 24  GLUCOSE 150* 139*  BUN 29* 33*  CREATININE 1.85* 2.12*  CALCIUM 8.8* 8.6*   PT/INR No results for input(s): LABPROT, INR in the last 72 hours. CMP     Component Value Date/Time   NA 132 (L) 07/29/2020 0535   K 3.9 07/29/2020 0535   CL 102 07/29/2020 0535   CO2 24 07/29/2020 0535   GLUCOSE 139 (H) 07/29/2020 0535   BUN 33 (H) 07/29/2020 0535   CREATININE 2.12 (H) 07/29/2020 0535   CALCIUM 8.6 (L) 07/29/2020 0535   PROT 6.8 07/24/2020 2125   ALBUMIN 3.3 (L) 07/24/2020 2125   AST 26 07/24/2020 2125   ALT 19 07/24/2020 2125   ALKPHOS 118 07/24/2020 2125   BILITOT 0.8 07/24/2020 2125   GFRNONAA 36 (L) 07/29/2020 0535   GFRAA >60 01/26/2018  1339   Lipase  No results found for: LIPASE     Studies/Results: CT CHEST W CONTRAST  Result Date: 07/28/2020 CLINICAL DATA:  Colorectal cancer.  Rectal bleeding EXAM: CT CHEST, ABDOMEN, AND PELVIS WITH CONTRAST TECHNIQUE: Multidetector CT imaging of the chest, abdomen and pelvis was performed following the standard protocol during bolus administration of intravenous contrast. CONTRAST:  14mL OMNIPAQUE IOHEXOL 300 MG/ML  SOLN COMPARISON:  None available FINDINGS: CT CHEST FINDINGS Cardiovascular: No significant vascular findings. Normal heart size. No pericardial effusion. Mediastinum/Nodes: No axillary or supraclavicular adenopathy. No mediastinal or hilar adenopathy. No pericardial fluid. Esophagus normal. Lungs/Pleura: Smudgy nodule in the LEFT lung apex measures 3 mm on image 36/7). Linear scarring at the RIGHT lung apex with a nodular focus measuring 3 mm on image 25/7. Subpleural nodule in the RIGHT upper lobe on image 80/7. Small bilateral pleural effusions with passive atelectasis lung bases. Musculoskeletal: No aggressive osseous lesion. CT ABDOMEN AND PELVIS FINDINGS Hepatobiliary: No focal hepatic lesion. No biliary ductal dilatation. Gallbladder is normal. Common bile duct is normal. Pancreas: Pancreas is normal. No ductal dilatation. No pancreatic inflammation. Spleen: Normal spleen Adrenals/urinary tract: Adrenal glands normal. Low-density lesion in the RIGHT kidney is favored benign. Ureters and bladder normal Stomach/Bowel: Small hiatal hernia. Stomach duodenum and small  bowel normal oral contrast transits to the ascending colon. The neck is normal. Ascending transverse colon normal. In the distal transverse colon there is a segment of circumferential narrowing measuring 2 cm image 67/2. There is collapse of the colon through the splenic flexure. Asymmetric mucosal thickening in the descending proximal descending colon (image 73/2). Distal descending colon and rectosigmoid colon normal. No  evidence abnormal lymph nodes within the colon mesentery. No retroperitoneal nodes. Vascular/Lymphatic: Abdominal aorta is normal caliber. There is no retroperitoneal or periportal lymphadenopathy. No pelvic lymphadenopathy. Reproductive: Prostate normal Other: No peritoneal disease Musculoskeletal: No aggressive osseous lesion. IMPRESSION: Chest Impression: 1. Three small pulmonary nodules less than 5 mm. Favor benign nodules. Recommend correlation with tumor surgical pathology. Recommend attention on follow-up. 2. No mediastinal adenopathy. Abdomen / Pelvis Impression: 1. Circumferential luminal narrowing in the distal transverse colon is nonspecific but may represent the malignancy cyst identified on colonoscopy in the LEFT colon. Asymmetric thickening within the descending colon is also noted. 2. No evidence of metastatic adenopathy in the mesentery porta hepatis. 3. No liver metastasis. Electronically Signed   By: Suzy Bouchard M.D.   On: 07/28/2020 08:54   CT ABDOMEN PELVIS W CONTRAST  Result Date: 07/28/2020 CLINICAL DATA:  Colorectal cancer.  Rectal bleeding EXAM: CT CHEST, ABDOMEN, AND PELVIS WITH CONTRAST TECHNIQUE: Multidetector CT imaging of the chest, abdomen and pelvis was performed following the standard protocol during bolus administration of intravenous contrast. CONTRAST:  156mL OMNIPAQUE IOHEXOL 300 MG/ML  SOLN COMPARISON:  None available FINDINGS: CT CHEST FINDINGS Cardiovascular: No significant vascular findings. Normal heart size. No pericardial effusion. Mediastinum/Nodes: No axillary or supraclavicular adenopathy. No mediastinal or hilar adenopathy. No pericardial fluid. Esophagus normal. Lungs/Pleura: Smudgy nodule in the LEFT lung apex measures 3 mm on image 36/7). Linear scarring at the RIGHT lung apex with a nodular focus measuring 3 mm on image 25/7. Subpleural nodule in the RIGHT upper lobe on image 80/7. Small bilateral pleural effusions with passive atelectasis lung bases.  Musculoskeletal: No aggressive osseous lesion. CT ABDOMEN AND PELVIS FINDINGS Hepatobiliary: No focal hepatic lesion. No biliary ductal dilatation. Gallbladder is normal. Common bile duct is normal. Pancreas: Pancreas is normal. No ductal dilatation. No pancreatic inflammation. Spleen: Normal spleen Adrenals/urinary tract: Adrenal glands normal. Low-density lesion in the RIGHT kidney is favored benign. Ureters and bladder normal Stomach/Bowel: Small hiatal hernia. Stomach duodenum and small bowel normal oral contrast transits to the ascending colon. The neck is normal. Ascending transverse colon normal. In the distal transverse colon there is a segment of circumferential narrowing measuring 2 cm image 67/2. There is collapse of the colon through the splenic flexure. Asymmetric mucosal thickening in the descending proximal descending colon (image 73/2). Distal descending colon and rectosigmoid colon normal. No evidence abnormal lymph nodes within the colon mesentery. No retroperitoneal nodes. Vascular/Lymphatic: Abdominal aorta is normal caliber. There is no retroperitoneal or periportal lymphadenopathy. No pelvic lymphadenopathy. Reproductive: Prostate normal Other: No peritoneal disease Musculoskeletal: No aggressive osseous lesion. IMPRESSION: Chest Impression: 1. Three small pulmonary nodules less than 5 mm. Favor benign nodules. Recommend correlation with tumor surgical pathology. Recommend attention on follow-up. 2. No mediastinal adenopathy. Abdomen / Pelvis Impression: 1. Circumferential luminal narrowing in the distal transverse colon is nonspecific but may represent the malignancy cyst identified on colonoscopy in the LEFT colon. Asymmetric thickening within the descending colon is also noted. 2. No evidence of metastatic adenopathy in the mesentery porta hepatis. 3. No liver metastasis. Electronically Signed   By: Helane Gunther.D.  On: 07/28/2020 08:54   [place] EKG SITE RITE  Result Date:  07/28/2020 If Site Rite image not attached, placement could not be confirmed due to current cardiac rhythm.   Anti-infectives: Anti-infectives (From admission, onward)   Start     Dose/Rate Route Frequency Ordered Stop   07/30/20 0600  cefoTEtan (CEFOTAN) 2 g in sodium chloride 0.9 % 100 mL IVPB        2 g 200 mL/hr over 30 Minutes Intravenous On call to O.R. 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  - Back off to CLD, bowel prep tonight, possible lap assisted partial colectomy tomorrow 3/11, barring any surgical emergencies that present in the next 24 hours. If more urgent cases arise then patients surgery may be delayed until this weekend or early next week. Will also review CT scan with [name] Done - noted luminal narrowing of the distal transverse colon, pt may need extended left hemicolectomy.  - surgical oncology consult is pending, will follow   FEN:CLD ID:  None DVT:  SCD's    LOS: 5 days    Obie Dredge, [facility] Please see Amion for pager number during day hours 7:00am-4:30pm

## 2020-07-29 NOTE — Plan of Care (Signed)
  Problem: Clinical Measurements: Goal: Diagnostic test results will improve Outcome: Progressing Goal: Respiratory complications will improve Outcome: Progressing Goal: Cardiovascular complication will be avoided Outcome: Progressing   Problem: Activity: Goal: Risk for activity intolerance will decrease Outcome: Progressing   

## 2020-07-29 NOTE — H&P (View-Only) (Signed)
[facility] Progress Note  2 Days Post-Op  Subjective: CC: denies abdominal pain, nausea, or emesis. Tolerating PO. Did have some bloody in his BMs when he did bowel prep for colonoscopy but did not have any prior to this.    Objective: Vital signs in last 24 hours: Temp:  [98.3 F (36.8 C)-99.5 F (37.5 C)] 99.5 F (37.5 C) (03/10 0428) Pulse Rate:  [75-80] 80 (03/10 0905) Resp:  [18] 18 (03/10 0428) BP: (143-176)/(70-94) 157/75 (03/10 0905) SpO2:  [93 %-98 %] 97 % (03/10 0905) Weight:  [101.6 kg] 101.6 kg (03/10 0500) Last BM Date: 07/27/20  Intake/Output from previous day: 03/09 0701 - 03/10 0700 In: 960 [P.O.:960] Out: 2500 [Urine:2500] Intake/Output this shift: Total I/O In: 240 [P.O.:240] Out: 400 [Urine:400]  PE: Gen:  Alert, NAD, pleasant Card:  Regular rate and rhythm, pedal pulses 2+ BL Pulm:  Normal effort, clear to auscultation bilaterally Abd: Soft, non-tender, non-distended, bowel sounds present in all 4 quadrants, no HSM Skin: warm and dry, no rashes  Psych: A&Ox3   Lab Results:  Recent Labs    07/28/20 0505 07/29/20 0535  WBC 8.4 7.3  HGB 8.4* 8.0*  HCT 27.0* 25.3*  PLT 302 270   BMET Recent Labs    07/28/20 0505 07/29/20 0535  NA 132* 132*  K 4.4 3.9  CL 102 102  CO2 23 24  GLUCOSE 150* 139*  BUN 29* 33*  CREATININE 1.85* 2.12*  CALCIUM 8.8* 8.6*   PT/INR No results for input(s): LABPROT, INR in the last 72 hours. CMP     Component Value Date/Time   NA 132 (L) 07/29/2020 0535   K 3.9 07/29/2020 0535   CL 102 07/29/2020 0535   CO2 24 07/29/2020 0535   GLUCOSE 139 (H) 07/29/2020 0535   BUN 33 (H) 07/29/2020 0535   CREATININE 2.12 (H) 07/29/2020 0535   CALCIUM 8.6 (L) 07/29/2020 0535   PROT 6.8 07/24/2020 2125   ALBUMIN 3.3 (L) 07/24/2020 2125   AST 26 07/24/2020 2125   ALT 19 07/24/2020 2125   ALKPHOS 118 07/24/2020 2125   BILITOT 0.8 07/24/2020 2125   GFRNONAA 36 (L) 07/29/2020 0535   GFRAA >60 01/26/2018  1339   Lipase  No results found for: LIPASE     Studies/Results: CT CHEST W CONTRAST  Result Date: 07/28/2020 CLINICAL DATA:  Colorectal cancer.  Rectal bleeding EXAM: CT CHEST, ABDOMEN, AND PELVIS WITH CONTRAST TECHNIQUE: Multidetector CT imaging of the chest, abdomen and pelvis was performed following the standard protocol during bolus administration of intravenous contrast. CONTRAST:  186mL OMNIPAQUE IOHEXOL 300 MG/ML  SOLN COMPARISON:  None available FINDINGS: CT CHEST FINDINGS Cardiovascular: No significant vascular findings. Normal heart size. No pericardial effusion. Mediastinum/Nodes: No axillary or supraclavicular adenopathy. No mediastinal or hilar adenopathy. No pericardial fluid. Esophagus normal. Lungs/Pleura: Smudgy nodule in the LEFT lung apex measures 3 mm on image 36/7). Linear scarring at the RIGHT lung apex with a nodular focus measuring 3 mm on image 25/7. Subpleural nodule in the RIGHT upper lobe on image 80/7. Small bilateral pleural effusions with passive atelectasis lung bases. Musculoskeletal: No aggressive osseous lesion. CT ABDOMEN AND PELVIS FINDINGS Hepatobiliary: No focal hepatic lesion. No biliary ductal dilatation. Gallbladder is normal. Common bile duct is normal. Pancreas: Pancreas is normal. No ductal dilatation. No pancreatic inflammation. Spleen: Normal spleen Adrenals/urinary tract: Adrenal glands normal. Low-density lesion in the RIGHT kidney is favored benign. Ureters and bladder normal Stomach/Bowel: Small hiatal hernia. Stomach duodenum and small  bowel normal oral contrast transits to the ascending colon. The neck is normal. Ascending transverse colon normal. In the distal transverse colon there is a segment of circumferential narrowing measuring 2 cm image 67/2. There is collapse of the colon through the splenic flexure. Asymmetric mucosal thickening in the descending proximal descending colon (image 73/2). Distal descending colon and rectosigmoid colon normal. No  evidence abnormal lymph nodes within the colon mesentery. No retroperitoneal nodes. Vascular/Lymphatic: Abdominal aorta is normal caliber. There is no retroperitoneal or periportal lymphadenopathy. No pelvic lymphadenopathy. Reproductive: Prostate normal Other: No peritoneal disease Musculoskeletal: No aggressive osseous lesion. IMPRESSION: Chest Impression: 1. Three small pulmonary nodules less than 5 mm. Favor benign nodules. Recommend correlation with tumor surgical pathology. Recommend attention on follow-up. 2. No mediastinal adenopathy. Abdomen / Pelvis Impression: 1. Circumferential luminal narrowing in the distal transverse colon is nonspecific but may represent the malignancy cyst identified on colonoscopy in the LEFT colon. Asymmetric thickening within the descending colon is also noted. 2. No evidence of metastatic adenopathy in the mesentery porta hepatis. 3. No liver metastasis. Electronically Signed   By: Suzy Bouchard M.D.   On: 07/28/2020 08:54   CT ABDOMEN PELVIS W CONTRAST  Result Date: 07/28/2020 CLINICAL DATA:  Colorectal cancer.  Rectal bleeding EXAM: CT CHEST, ABDOMEN, AND PELVIS WITH CONTRAST TECHNIQUE: Multidetector CT imaging of the chest, abdomen and pelvis was performed following the standard protocol during bolus administration of intravenous contrast. CONTRAST:  175mL OMNIPAQUE IOHEXOL 300 MG/ML  SOLN COMPARISON:  None available FINDINGS: CT CHEST FINDINGS Cardiovascular: No significant vascular findings. Normal heart size. No pericardial effusion. Mediastinum/Nodes: No axillary or supraclavicular adenopathy. No mediastinal or hilar adenopathy. No pericardial fluid. Esophagus normal. Lungs/Pleura: Smudgy nodule in the LEFT lung apex measures 3 mm on image 36/7). Linear scarring at the RIGHT lung apex with a nodular focus measuring 3 mm on image 25/7. Subpleural nodule in the RIGHT upper lobe on image 80/7. Small bilateral pleural effusions with passive atelectasis lung bases.  Musculoskeletal: No aggressive osseous lesion. CT ABDOMEN AND PELVIS FINDINGS Hepatobiliary: No focal hepatic lesion. No biliary ductal dilatation. Gallbladder is normal. Common bile duct is normal. Pancreas: Pancreas is normal. No ductal dilatation. No pancreatic inflammation. Spleen: Normal spleen Adrenals/urinary tract: Adrenal glands normal. Low-density lesion in the RIGHT kidney is favored benign. Ureters and bladder normal Stomach/Bowel: Small hiatal hernia. Stomach duodenum and small bowel normal oral contrast transits to the ascending colon. The neck is normal. Ascending transverse colon normal. In the distal transverse colon there is a segment of circumferential narrowing measuring 2 cm image 67/2. There is collapse of the colon through the splenic flexure. Asymmetric mucosal thickening in the descending proximal descending colon (image 73/2). Distal descending colon and rectosigmoid colon normal. No evidence abnormal lymph nodes within the colon mesentery. No retroperitoneal nodes. Vascular/Lymphatic: Abdominal aorta is normal caliber. There is no retroperitoneal or periportal lymphadenopathy. No pelvic lymphadenopathy. Reproductive: Prostate normal Other: No peritoneal disease Musculoskeletal: No aggressive osseous lesion. IMPRESSION: Chest Impression: 1. Three small pulmonary nodules less than 5 mm. Favor benign nodules. Recommend correlation with tumor surgical pathology. Recommend attention on follow-up. 2. No mediastinal adenopathy. Abdomen / Pelvis Impression: 1. Circumferential luminal narrowing in the distal transverse colon is nonspecific but may represent the malignancy cyst identified on colonoscopy in the LEFT colon. Asymmetric thickening within the descending colon is also noted. 2. No evidence of metastatic adenopathy in the mesentery porta hepatis. 3. No liver metastasis. Electronically Signed   By: Helane Gunther.D.  On: 07/28/2020 08:54   [place] EKG SITE RITE  Result Date:  07/28/2020 If Site Rite image not attached, placement could not be confirmed due to current cardiac rhythm.   Anti-infectives: Anti-infectives (From admission, onward)   Start     Dose/Rate Route Frequency Ordered Stop   07/30/20 0600  cefoTEtan (CEFOTAN) 2 g in sodium chloride 0.9 % 100 mL IVPB        2 g 200 mL/hr over 30 Minutes Intravenous On call to O.R. 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  - Back off to [facility], bowel prep tonight, possible lap assisted partial colectomy tomorrow 3/11, barring any surgical emergencies that present in the next 24 hours. If more urgent cases arise then patients surgery may be delayed until this weekend or early next week. Will also review CT scan with [name] Done - noted luminal narrowing of the distal transverse colon, pt may need extended left hemicolectomy.  - surgical oncology consult is pending, will follow   FEN:CLD ID:  None DVT:  SCD's    LOS: 5 days    Obie Dredge, [facility] Please see Amion for pager number during day hours 7:00am-4:30pm

## 2020-07-29 NOTE — Progress Notes (Signed)
Hypoglycemic Event  CBG: 54  Treatment: Sprite 8oz  Symptoms: Weakness, Sweating: Pt reported he felt weak and as if his blood sugar was low.   Follow-up CBG: Time: 2322 CBG Result: 60                                      2350                       74  Possible Reasons for Event: Doing bowel prep and on clear liquid diet, administered 5 units of insulin for blood sugar of 275 at 2018.   Comments/MD notified: X. Blount NP notified. No new orders.    Darnelle Catalan  Follow-up CBG: 60  Treatment: Sprite 8oz  Symptoms: Weakness, Sweating  Follow-up CBG: Time: 2350                       CBG Result: 74

## 2020-07-29 NOTE — Consult Note (Addendum)
[place]  [phone] [phone]   [place]  Referral MD: Dr. 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  The patient presented to the emergency room with shortness of breath and hypoglycemia.  He was also having lightheadedness.  Blood glucose on admission was 34.  His CBC showed a hemoglobin of 6.0.  Stool for occult blood positive.  His BUN was 32, creatinine 1.64.  BNP was elevated at 210.  His chest x-ray showed findings consistent with CHF including cardiomegaly, vascular congestion, and interstitial edema and bilateral pleural effusions.  The patient has been diuresed and has been seen by cardiology.  For his anemia, he has received a total of 3 units PRBCs this admission.  The patient had a large polyp in the region of the splenic flexure on a prior colonoscopy in 2020.  He was referred to [facility] 2 years ago for endoscopic mucosal resection but the procedure was deferred due to the COVID-19 pandemic and had not yet been rescheduled.  The patient had a CT of the chest/abdomen/pelvis with contrast performed this admission which showed 3 small pulmonary nodules less than 5 mm favored to be benign, no mediastinal adenopathy, circumferential luminal narrowing in the distal transverse colon, no evidence of metastatic adenopathy in the mesentery porta hepatis, no metastasis.  A colonoscopy was performed on 07/27/2020 which showed a fungating, infiltrative and ulcerated nonobstructing large mass in the proximal descending colon.  This mass was partially circumferential involving two thirds of the lumen circumference.  Biopsies were taken and pathology results are currently pending.  A CEA was obtained on  07/27/2020 was elevated at 17.3.  The patient has been seen by general surgery who are considering him for a left colon resection.  They are awaiting cardiology clearance and medical clearance before scheduling surgery.  The patient was seen in his hospital room today.  He reports that he feels much better.  Been ambulating in the hallway several times a day.  He denies having a poor appetite or weight loss recently.  His lightheadedness and dyspnea have improved since receiving blood transfusions.  He did not notice any melena prior to this admission.  He is not having any fevers, chills, headaches, dizziness, chest pain, abdominal pain, nausea, vomiting today.  His lower extremity edema has improved.  The patient is married and has 1 son.  He denies history of alcohol tobacco use.  Denies family history of malignancy.  Medical oncology was asked see the patient and recommendations regarding his descending colon mass.   Past Medical History:  Diagnosis Date  . Diabetes mellitus without complication ([place])    type 2  :  Past Surgical History:  Procedure Laterality Date  . AMPUTATION Left 01/22/2018   Procedure: TRANSMETATARSAL AMPUTATION;  Surgeon: Newt Minion, MD;  Location: [employer];  Service: Orthopedics;  Laterality: Left;toes  . BIOPSY  07/27/2020   Procedure: BIOPSY;  Surgeon: Carol Ada, MD;  Location: [facility];  Service: Endoscopy;;  . COLONOSCOPY WITH PROPOFOL N/A 05/24/2018   Procedure: COLONOSCOPY WITH PROPOFOL;  Surgeon: Carol Ada, MD;  Location: [facility];  Service: Endoscopy;  Laterality: N/A;  . COLONOSCOPY WITH PROPOFOL N/A 07/27/2020   Procedure: COLONOSCOPY WITH PROPOFOL;  Surgeon: Carol Ada, MD;  Location:  [facility];  Service: Endoscopy;  Laterality: N/A;  . POLYPECTOMY  05/24/2018   Procedure: POLYPECTOMY;  Surgeon: Carol Ada, MD;  Location: [facility];  Service: Endoscopy;;  . SUBMUCOSAL TATTOO INJECTION  07/27/2020   Procedure: SUBMUCOSAL TATTOO INJECTION;   Surgeon: Carol Ada, MD;  Location: [facility];  Service: Endoscopy;;  :  Current Facility-Administered Medications  Medication Dose Route Frequency Provider Last Rate Last Admin  . 0.9 %  sodium chloride infusion  250 mL Intravenous PRN Carol Ada, MD      . acetaminophen (TYLENOL) tablet 650 mg  650 mg Oral Q4H PRN Carol Ada, MD   650 mg at 07/26/20 0333  . alvimopan (ENTEREG) capsule 12 mg  12 mg Oral On Call to [place], Darci Current, PA-C      . bisacodyl (DULCOLAX) EC tablet 20 mg  20 mg Oral Once Simaan, Elizabeth S, PA-C      . carvedilol (COREG) tablet 3.125 mg  3.125 mg Oral BID WC Carol Ada, MD   3.125 mg at 07/29/20 0906  . cefoTEtan (CEFOTAN) 2 g in sodium chloride 0.9 % 100 mL IVPB  2 g Intravenous On Call to [place], [place], PA-C      . Chlorhexidine Gluconate Cloth 2 % PADS 6 each  6 each Topical Once Jill Alexanders, PA-C       And  . Chlorhexidine Gluconate Cloth 2 % PADS 6 each  6 each Topical Once Jill Alexanders, PA-C      . [START ON 07/30/2020] feeding supplement (ENSURE PRE-SURGERY) liquid 296 mL  296 mL Oral Once Simaan, Elizabeth S, PA-C      . feeding supplement (ENSURE PRE-SURGERY) liquid 592 mL  592 mL Oral Once Simaan, Elizabeth S, PA-C      . fluticasone (FLONASE) 50 MCG/ACT nasal spray 2 spray  2 spray Each Nare Daily Carol Ada, MD   2 spray at 07/29/20 0907  . heparin injection 5,000 Units  5,000 Units Subcutaneous Once Obie Dredge S, PA-C      . hydrALAZINE (APRESOLINE) tablet 25 mg  25 mg Oral Q8H Carol Ada, MD   25 mg at 07/29/20 0545  . insulin aspart (novoLOG) injection 0-9 Units  0-9 Units Subcutaneous Q4H Carol Ada, MD   1 Units at 07/29/20 0544  . labetalol (NORMODYNE) injection 5-10 mg  5-10 mg Intravenous Q2H PRN Carol Ada, MD      . loratadine (CLARITIN) tablet 10 mg  10 mg Oral Daily Carol Ada, MD   10 mg at 07/29/20 0906  . neomycin (MYCIFRADIN) tablet 1,000 mg  1,000 mg Oral 3 times per day  Jill Alexanders, PA-C       And  . metroNIDAZOLE (FLAGYL) tablet 1,000 mg  1,000 mg Oral 3 times per day Simaan, Elizabeth S, PA-C      . ondansetron [facility]) injection 4 mg  4 mg Intravenous Q6H PRN Carol Ada, MD      . polyethylene glycol powder (GLYCOLAX/MIRALAX) container 255 g  1 Container Oral Once Jill Alexanders, PA-C      . sodium chloride flush (NS) 0.9 % injection 10-40 mL  10-40 mL Intracatheter Q12H Little Ishikawa, MD      . sodium chloride flush (NS) 0.9 % injection 10-40 mL  10-40 mL Intracatheter PRN Little Ishikawa, MD   10 mL at 07/29/20 0535  . sodium chloride flush (NS) 0.9 % injection 3 mL  3 mL Intravenous Q12H Carol Ada,  MD   3 mL at 07/28/20 0900  . sodium chloride flush (NS) 0.9 % injection 3 mL  3 mL Intravenous PRN Carol Ada, MD         Allergies  Allergen Reactions  . Bee Venom Swelling    Cold Sweats  . Latex Rash  :  Family History  Problem Relation Age of Onset  . Hypertension Father   :  Social History   Socioeconomic History  . Marital status: Married    Spouse name: Not on file  . Number of children: Not on file  . Years of education: Not on file  . Highest education level: Not on file  Occupational History  . Not on file  Tobacco Use  . Smoking status: Never Smoker  . Smokeless tobacco: Never Used  Vaping Use  . Vaping Use: Never used  Substance and Sexual Activity  . Alcohol use: Yes    Alcohol/week: 1.0 standard drink    Types: 1 Cans of beer per week    Comment: occasional  . Drug use: Never  . Sexual activity: Yes  Other Topics Concern  . Not on file  Social History Narrative  . Not on file   Social Determinants of Health   Financial Resource Strain: Not on file  Food Insecurity: Not on file  Transportation Needs: Not on file  Physical Activity: Not on file  Stress: Not on file  Social Connections: Not on file  Intimate Partner Violence: Not on file  :  Review of Systems: A  comprehensive 14 point review of systems was negative except as noted in the HPI.  Exam: Patient Vitals for the past 24 hrs:  BP Temp Temp src Pulse Resp SpO2 Weight  07/29/20 0905 (!) 157/75 - - 80 - 97 % -  07/29/20 0831 - - - - - 96 % -  07/29/20 0500 - - - - - - 101.6 kg  07/29/20 0428 (!) 170/89 99.5 F (37.5 C) Oral 77 18 97 % -  07/28/20 2250 (!) 143/91 - - 80 - - -  07/28/20 2024 (!) 176/94 98.3 F (36.8 C) Oral 77 18 96 % -  07/28/20 1707 (!) 149/70 - - 75 - 98 % -  07/28/20 1300 (!) 143/79 98.4 F (36.9 C) Oral 75 18 93 % -    General:  well-nourished in no acute distress.   Eyes:  no scleral icterus.   ENT:  There were no oropharyngeal lesions.   Lymphatics:  Negative cervical, supraclavicular or axillary adenopathy.   Respiratory: lungs were clear bilaterally without wheezing or crackles.   Cardiovascular:  Regular rate and rhythm, S1/S2, without murmur, rub or gallop.  There was trace pedal edema.   GI:  abdomen was soft, flat, nontender, nondistended, without organomegaly.   Musculoskeletal:  no spinal tenderness of palpation of vertebral spine.  Left transmetatarsal amputation. Skin exam was without echymosis, petichae.   Neuro exam was nonfocal. Patient was alert and oriented.  Attention was good.   Language was appropriate.  Mood was normal without depression.  Speech was not pressured.  Thought content was not tangential.     Lab Results  Component Value Date   WBC 7.3 07/29/2020   HGB 8.0 (L) 07/29/2020   HCT 25.3 (L) 07/29/2020   PLT 270 07/29/2020   GLUCOSE 139 (H) 07/29/2020   CHOL 152 07/27/2020   TRIG 109 07/27/2020   HDL 36 (L) 07/27/2020   LDLCALC 94 07/27/2020   ALT 19  07/24/2020   AST 26 07/24/2020   NA 132 (L) 07/29/2020   K 3.9 07/29/2020   CL 102 07/29/2020   CREATININE 2.12 (H) 07/29/2020   BUN 33 (H) 07/29/2020   CO2 24 07/29/2020    DG Chest 2 View  Result Date: 07/24/2020 CLINICAL DATA:  Shortness of breath, lower extremity  swelling, hyperglycemia EXAM: CHEST - 2 VIEW COMPARISON:  01/21/2018 FINDINGS: Diffuse hazy interstitial opacities throughout the lungs with peripheral septal and fissural thickening. Bilateral pleural effusions. No pneumothorax. Congested pulmonary vascularity. Cardiac silhouette appears enlarged from comparison radiograph accounting for differences in technique. Remaining cardiomediastinal contours are unremarkable. No acute osseous or soft tissue abnormality. IMPRESSION: Findings consistent with CHF including cardiomegaly, vascular congestion, interstitial edema and bilateral pleural effusions. Electronically Signed   By: Lovena Le M.D.   On: 07/24/2020 19:11   CT CHEST W CONTRAST  Result Date: 07/28/2020 CLINICAL DATA:  Colorectal cancer.  Rectal bleeding EXAM: CT CHEST, ABDOMEN, AND PELVIS WITH CONTRAST TECHNIQUE: Multidetector CT imaging of the chest, abdomen and pelvis was performed following the standard protocol during bolus administration of intravenous contrast. CONTRAST:  127mL OMNIPAQUE IOHEXOL 300 MG/ML  SOLN COMPARISON:  None available FINDINGS: CT CHEST FINDINGS Cardiovascular: No significant vascular findings. Normal heart size. No pericardial effusion. Mediastinum/Nodes: No axillary or supraclavicular adenopathy. No mediastinal or hilar adenopathy. No pericardial fluid. Esophagus normal. Lungs/Pleura: Smudgy nodule in the LEFT lung apex measures 3 mm on image 36/7). Linear scarring at the RIGHT lung apex with a nodular focus measuring 3 mm on image 25/7. Subpleural nodule in the RIGHT upper lobe on image 80/7. Small bilateral pleural effusions with passive atelectasis lung bases. Musculoskeletal: No aggressive osseous lesion. CT ABDOMEN AND PELVIS FINDINGS Hepatobiliary: No focal hepatic lesion. No biliary ductal dilatation. Gallbladder is normal. Common bile duct is normal. Pancreas: Pancreas is normal. No ductal dilatation. No pancreatic inflammation. Spleen: Normal spleen Adrenals/urinary  tract: Adrenal glands normal. Low-density lesion in the RIGHT kidney is favored benign. Ureters and bladder normal Stomach/Bowel: Small hiatal hernia. Stomach duodenum and small bowel normal oral contrast transits to the ascending colon. The neck is normal. Ascending transverse colon normal. In the distal transverse colon there is a segment of circumferential narrowing measuring 2 cm image 67/2. There is collapse of the colon through the splenic flexure. Asymmetric mucosal thickening in the descending proximal descending colon (image 73/2). Distal descending colon and rectosigmoid colon normal. No evidence abnormal lymph nodes within the colon mesentery. No retroperitoneal nodes. Vascular/Lymphatic: Abdominal aorta is normal caliber. There is no retroperitoneal or periportal lymphadenopathy. No pelvic lymphadenopathy. Reproductive: Prostate normal Other: No peritoneal disease Musculoskeletal: No aggressive osseous lesion. IMPRESSION: Chest Impression: 1. Three small pulmonary nodules less than 5 mm. Favor benign nodules. Recommend correlation with tumor surgical pathology. Recommend attention on follow-up. 2. No mediastinal adenopathy. Abdomen / Pelvis Impression: 1. Circumferential luminal narrowing in the distal transverse colon is nonspecific but may represent the malignancy cyst identified on colonoscopy in the LEFT colon. Asymmetric thickening within the descending colon is also noted. 2. No evidence of metastatic adenopathy in the mesentery porta hepatis. 3. No liver metastasis. Electronically Signed   By: Suzy Bouchard M.D.   On: 07/28/2020 08:54   CT ABDOMEN PELVIS W CONTRAST  Result Date: 07/28/2020 CLINICAL DATA:  Colorectal cancer.  Rectal bleeding EXAM: CT CHEST, ABDOMEN, AND PELVIS WITH CONTRAST TECHNIQUE: Multidetector CT imaging of the chest, abdomen and pelvis was performed following the standard protocol during bolus administration of intravenous contrast. CONTRAST:  152mL OMNIPAQUE IOHEXOL  300 MG/ML  SOLN COMPARISON:  None available FINDINGS: CT CHEST FINDINGS Cardiovascular: No significant vascular findings. Normal heart size. No pericardial effusion. Mediastinum/Nodes: No axillary or supraclavicular adenopathy. No mediastinal or hilar adenopathy. No pericardial fluid. Esophagus normal. Lungs/Pleura: Smudgy nodule in the LEFT lung apex measures 3 mm on image 36/7). Linear scarring at the RIGHT lung apex with a nodular focus measuring 3 mm on image 25/7. Subpleural nodule in the RIGHT upper lobe on image 80/7. Small bilateral pleural effusions with passive atelectasis lung bases. Musculoskeletal: No aggressive osseous lesion. CT ABDOMEN AND PELVIS FINDINGS Hepatobiliary: No focal hepatic lesion. No biliary ductal dilatation. Gallbladder is normal. Common bile duct is normal. Pancreas: Pancreas is normal. No ductal dilatation. No pancreatic inflammation. Spleen: Normal spleen Adrenals/urinary tract: Adrenal glands normal. Low-density lesion in the RIGHT kidney is favored benign. Ureters and bladder normal Stomach/Bowel: Small hiatal hernia. Stomach duodenum and small bowel normal oral contrast transits to the ascending colon. The neck is normal. Ascending transverse colon normal. In the distal transverse colon there is a segment of circumferential narrowing measuring 2 cm image 67/2. There is collapse of the colon through the splenic flexure. Asymmetric mucosal thickening in the descending proximal descending colon (image 73/2). Distal descending colon and rectosigmoid colon normal. No evidence abnormal lymph nodes within the colon mesentery. No retroperitoneal nodes. Vascular/Lymphatic: Abdominal aorta is normal caliber. There is no retroperitoneal or periportal lymphadenopathy. No pelvic lymphadenopathy. Reproductive: Prostate normal Other: No peritoneal disease Musculoskeletal: No aggressive osseous lesion. IMPRESSION: Chest Impression: 1. Three small pulmonary nodules less than 5 mm. Favor benign  nodules. Recommend correlation with tumor surgical pathology. Recommend attention on follow-up. 2. No mediastinal adenopathy. Abdomen / Pelvis Impression: 1. Circumferential luminal narrowing in the distal transverse colon is nonspecific but may represent the malignancy cyst identified on colonoscopy in the LEFT colon. Asymmetric thickening within the descending colon is also noted. 2. No evidence of metastatic adenopathy in the mesentery porta hepatis. 3. No liver metastasis. Electronically Signed   By: Suzy Bouchard M.D.   On: 07/28/2020 08:54   US RENAL  Result Date: 07/26/2020 CLINICAL DATA:  Acute kidney injury. EXAM: RENAL / URINARY TRACT ULTRASOUND COMPLETE COMPARISON:  None. FINDINGS: Right Kidney: Renal measurements: 11.8 x 5.8 x 5.9 cm = volume: 211 mL. Parenchymal echogenicity within normal limits. There is a 2.1 x 2.3 x 1.7 cm cyst in the mid kidney. No solid mass or hydronephrosis visualized. Left Kidney: Renal measurements: 12.0 x 7.4 x 4.9 cm = volume: 229 mL. Parenchymal echogenicity within normal limits. No mass or hydronephrosis visualized. Bladder: Mild circumferential bladder wall thickening of 6 mm. Both ureteral jets are seen. Other: Bilateral pleural effusions. IMPRESSION: 1. Lateral thickening up to 6 mm. Recommend correlation with urinalysis. 2. Simple right renal cyst. Otherwise unremarkable sonographic appearance of the kidneys. 3. Bilateral pleural effusions. Electronically Signed   By: Keith Rake M.D.   On: 07/26/2020 18:06   ECHOCARDIOGRAM COMPLETE  Result Date: 07/25/2020    ECHOCARDIOGRAM REPORT   Patient Name:   Jeffrey Young Date of Exam: 07/25/2020 Medical Rec #:  [id number]      Height:       80.0 in Accession #:    [id number]     Weight:       227.1 lb Date of Birth:  August 21, 1965      BSA:          2.427 m Patient Age:    54 years  BP:           156/93 mmHg Patient Gender: M              HR:           83 bpm. Exam Location:  Inpatient Procedure: 2D Echo, Cardiac  Doppler and Color Doppler Indications:    I50.33 Acute on chronic diastolic (congestive) heart failure  History:        Patient has no prior history of Echocardiogram examinations.                 Risk Factors:Diabetes.  Sonographer:    Jonelle Sidle Dance Referring Phys: [place]  1. Left ventricular ejection fraction, by estimation, is 55 to 60%. The left ventricle has normal function. The left ventricle has no regional wall motion abnormalities. There is mild left ventricular hypertrophy. Left ventricular diastolic parameters are indeterminate.  2. Right ventricular systolic function is normal. The right ventricular size is normal. Tricuspid regurgitation signal is inadequate for assessing PA pressure.  3. There is a trivial pericardial effusion anterior to the right ventricle.  4. The mitral valve is grossly normal. Mild mitral valve regurgitation.  5. The aortic valve is tricuspid. Aortic valve regurgitation is not visualized.  6. The inferior vena cava is dilated in size with >50% respiratory variability, suggesting right atrial pressure of 8 mmHg. FINDINGS  Left Ventricle: Left ventricular ejection fraction, by estimation, is 55 to 60%. The left ventricle has normal function. The left ventricle has no regional wall motion abnormalities. The left ventricular internal cavity size was normal in size. There is  mild left ventricular hypertrophy. Left ventricular diastolic parameters are indeterminate. Right Ventricle: The right ventricular size is normal. No increase in right ventricular wall thickness. Right ventricular systolic function is normal. Tricuspid regurgitation signal is inadequate for assessing PA pressure. Left Atrium: Left atrial size was normal in size. Right Atrium: Right atrial size was normal in size. Pericardium: Trivial pericardial effusion is present. The pericardial effusion is anterior to the right ventricle. Mitral Valve: The mitral valve is grossly normal. Mild mitral  valve regurgitation. Tricuspid Valve: The tricuspid valve is grossly normal. Tricuspid valve regurgitation is trivial. Aortic Valve: The aortic valve is tricuspid. Aortic valve regurgitation is not visualized. Pulmonic Valve: The pulmonic valve was grossly normal. Pulmonic valve regurgitation is trivial. Aorta: The aortic root is normal in size and structure. Venous: The inferior vena cava is dilated in size with greater than 50% respiratory variability, suggesting right atrial pressure of 8 mmHg. IAS/Shunts: The interatrial septum appears to be lipomatous. No atrial level shunt detected by color flow Doppler.  LEFT VENTRICLE PLAX 2D LVIDd:         3.90 cm  Diastology LVIDs:         2.80 cm  LV e' medial:    7.29 cm/s LV PW:         1.30 cm  LV E/e' medial:  18.1 LV IVS:        1.10 cm  LV e' lateral:   8.38 cm/s LVOT diam:     2.20 cm  LV E/e' lateral: 15.8 LV SV:         89 LV SV Index:   37 LVOT Area:     3.80 cm  RIGHT VENTRICLE             IVC RV Basal diam:  2.50 cm     IVC diam: 2.50 cm RV S prime:  13.70 cm/s TAPSE (M-mode): 2.6 cm LEFT ATRIUM             Index       RIGHT ATRIUM           Index LA diam:        4.40 cm 1.81 cm/m  RA Area:     13.50 cm LA Vol (A2C):   75.5 ml 31.11 ml/m RA Volume:   25.70 ml  10.59 ml/m LA Vol (A4C):   34.5 ml 14.21 ml/m LA Biplane Vol: 55.9 ml 23.03 ml/m  AORTIC VALVE LVOT Vmax:   126.00 cm/s LVOT Vmean:  80.700 cm/s LVOT VTI:    0.235 m  AORTA Ao Root diam: 3.50 cm Ao Asc diam:  3.30 cm MITRAL VALVE MV Area (PHT): 3.21 cm     SHUNTS MV Decel Time: 236 msec     Systemic VTI:  0.24 m MV E velocity: 132.00 cm/s  Systemic Diam: 2.20 cm MV A velocity: 114.00 cm/s MV E/A ratio:  1.16 Rozann Lesches MD Electronically signed by Rozann Lesches MD Signature Date/Time: 07/25/2020/11:07:06 AM    Final    [place] EKG SITE RITE  Result Date: 07/28/2020 If Site Rite image not attached, placement could not be confirmed due to current cardiac rhythm.    DG Chest 2  View  Result Date: 07/24/2020 CLINICAL DATA:  Shortness of breath, lower extremity swelling, hyperglycemia EXAM: CHEST - 2 VIEW COMPARISON:  01/21/2018 FINDINGS: Diffuse hazy interstitial opacities throughout the lungs with peripheral septal and fissural thickening. Bilateral pleural effusions. No pneumothorax. Congested pulmonary vascularity. Cardiac silhouette appears enlarged from comparison radiograph accounting for differences in technique. Remaining cardiomediastinal contours are unremarkable. No acute osseous or soft tissue abnormality. IMPRESSION: Findings consistent with CHF including cardiomegaly, vascular congestion, interstitial edema and bilateral pleural effusions. Electronically Signed   By: Lovena Le M.D.   On: 07/24/2020 19:11   CT CHEST W CONTRAST  Result Date: 07/28/2020 CLINICAL DATA:  Colorectal cancer.  Rectal bleeding EXAM: CT CHEST, ABDOMEN, AND PELVIS WITH CONTRAST TECHNIQUE: Multidetector CT imaging of the chest, abdomen and pelvis was performed following the standard protocol during bolus administration of intravenous contrast. CONTRAST:  140mL OMNIPAQUE IOHEXOL 300 MG/ML  SOLN COMPARISON:  None available FINDINGS: CT CHEST FINDINGS Cardiovascular: No significant vascular findings. Normal heart size. No pericardial effusion. Mediastinum/Nodes: No axillary or supraclavicular adenopathy. No mediastinal or hilar adenopathy. No pericardial fluid. Esophagus normal. Lungs/Pleura: Smudgy nodule in the LEFT lung apex measures 3 mm on image 36/7). Linear scarring at the RIGHT lung apex with a nodular focus measuring 3 mm on image 25/7. Subpleural nodule in the RIGHT upper lobe on image 80/7. Small bilateral pleural effusions with passive atelectasis lung bases. Musculoskeletal: No aggressive osseous lesion. CT ABDOMEN AND PELVIS FINDINGS Hepatobiliary: No focal hepatic lesion. No biliary ductal dilatation. Gallbladder is normal. Common bile duct is normal. Pancreas: Pancreas is normal. No  ductal dilatation. No pancreatic inflammation. Spleen: Normal spleen Adrenals/urinary tract: Adrenal glands normal. Low-density lesion in the RIGHT kidney is favored benign. Ureters and bladder normal Stomach/Bowel: Small hiatal hernia. Stomach duodenum and small bowel normal oral contrast transits to the ascending colon. The neck is normal. Ascending transverse colon normal. In the distal transverse colon there is a segment of circumferential narrowing measuring 2 cm image 67/2. There is collapse of the colon through the splenic flexure. Asymmetric mucosal thickening in the descending proximal descending colon (image 73/2). Distal descending colon and rectosigmoid colon normal. No evidence abnormal lymph  nodes within the colon mesentery. No retroperitoneal nodes. Vascular/Lymphatic: Abdominal aorta is normal caliber. There is no retroperitoneal or periportal lymphadenopathy. No pelvic lymphadenopathy. Reproductive: Prostate normal Other: No peritoneal disease Musculoskeletal: No aggressive osseous lesion. IMPRESSION: Chest Impression: 1. Three small pulmonary nodules less than 5 mm. Favor benign nodules. Recommend correlation with tumor surgical pathology. Recommend attention on follow-up. 2. No mediastinal adenopathy. Abdomen / Pelvis Impression: 1. Circumferential luminal narrowing in the distal transverse colon is nonspecific but may represent the malignancy cyst identified on colonoscopy in the LEFT colon. Asymmetric thickening within the descending colon is also noted. 2. No evidence of metastatic adenopathy in the mesentery porta hepatis. 3. No liver metastasis. Electronically Signed   By: Suzy Bouchard M.D.   On: 07/28/2020 08:54   CT ABDOMEN PELVIS W CONTRAST  Result Date: 07/28/2020 CLINICAL DATA:  Colorectal cancer.  Rectal bleeding EXAM: CT CHEST, ABDOMEN, AND PELVIS WITH CONTRAST TECHNIQUE: Multidetector CT imaging of the chest, abdomen and pelvis was performed following the standard protocol  during bolus administration of intravenous contrast. CONTRAST:  184mL OMNIPAQUE IOHEXOL 300 MG/ML  SOLN COMPARISON:  None available FINDINGS: CT CHEST FINDINGS Cardiovascular: No significant vascular findings. Normal heart size. No pericardial effusion. Mediastinum/Nodes: No axillary or supraclavicular adenopathy. No mediastinal or hilar adenopathy. No pericardial fluid. Esophagus normal. Lungs/Pleura: Smudgy nodule in the LEFT lung apex measures 3 mm on image 36/7). Linear scarring at the RIGHT lung apex with a nodular focus measuring 3 mm on image 25/7. Subpleural nodule in the RIGHT upper lobe on image 80/7. Small bilateral pleural effusions with passive atelectasis lung bases. Musculoskeletal: No aggressive osseous lesion. CT ABDOMEN AND PELVIS FINDINGS Hepatobiliary: No focal hepatic lesion. No biliary ductal dilatation. Gallbladder is normal. Common bile duct is normal. Pancreas: Pancreas is normal. No ductal dilatation. No pancreatic inflammation. Spleen: Normal spleen Adrenals/urinary tract: Adrenal glands normal. Low-density lesion in the RIGHT kidney is favored benign. Ureters and bladder normal Stomach/Bowel: Small hiatal hernia. Stomach duodenum and small bowel normal oral contrast transits to the ascending colon. The neck is normal. Ascending transverse colon normal. In the distal transverse colon there is a segment of circumferential narrowing measuring 2 cm image 67/2. There is collapse of the colon through the splenic flexure. Asymmetric mucosal thickening in the descending proximal descending colon (image 73/2). Distal descending colon and rectosigmoid colon normal. No evidence abnormal lymph nodes within the colon mesentery. No retroperitoneal nodes. Vascular/Lymphatic: Abdominal aorta is normal caliber. There is no retroperitoneal or periportal lymphadenopathy. No pelvic lymphadenopathy. Reproductive: Prostate normal Other: No peritoneal disease Musculoskeletal: No aggressive osseous lesion.  IMPRESSION: Chest Impression: 1. Three small pulmonary nodules less than 5 mm. Favor benign nodules. Recommend correlation with tumor surgical pathology. Recommend attention on follow-up. 2. No mediastinal adenopathy. Abdomen / Pelvis Impression: 1. Circumferential luminal narrowing in the distal transverse colon is nonspecific but may represent the malignancy cyst identified on colonoscopy in the LEFT colon. Asymmetric thickening within the descending colon is also noted. 2. No evidence of metastatic adenopathy in the mesentery porta hepatis. 3. No liver metastasis. Electronically Signed   By: Suzy Bouchard M.D.   On: 07/28/2020 08:54   US RENAL  Result Date: 07/26/2020 CLINICAL DATA:  Acute kidney injury. EXAM: RENAL / URINARY TRACT ULTRASOUND COMPLETE COMPARISON:  None. FINDINGS: Right Kidney: Renal measurements: 11.8 x 5.8 x 5.9 cm = volume: 211 mL. Parenchymal echogenicity within normal limits. There is a 2.1 x 2.3 x 1.7 cm cyst in the mid kidney. No solid mass  or hydronephrosis visualized. Left Kidney: Renal measurements: 12.0 x 7.4 x 4.9 cm = volume: 229 mL. Parenchymal echogenicity within normal limits. No mass or hydronephrosis visualized. Bladder: Mild circumferential bladder wall thickening of 6 mm. Both ureteral jets are seen. Other: Bilateral pleural effusions. IMPRESSION: 1. Lateral thickening up to 6 mm. Recommend correlation with urinalysis. 2. Simple right renal cyst. Otherwise unremarkable sonographic appearance of the kidneys. 3. Bilateral pleural effusions. Electronically Signed   By: Keith Rake M.D.   On: 07/26/2020 18:06   ECHOCARDIOGRAM COMPLETE  Result Date: 07/25/2020    ECHOCARDIOGRAM REPORT   Patient Name:   Jeffrey Young Date of Exam: 07/25/2020 Medical Rec #:  [id number]      Height:       80.0 in Accession #:    [id number]     Weight:       227.1 lb Date of Birth:  07/28/1965      BSA:          2.427 m Patient Age:    26 years       BP:           156/93 mmHg Patient Gender:  M              HR:           83 bpm. Exam Location:  Inpatient Procedure: 2D Echo, Cardiac Doppler and Color Doppler Indications:    I50.33 Acute on chronic diastolic (congestive) heart failure  History:        Patient has no prior history of Echocardiogram examinations.                 Risk Factors:Diabetes.  Sonographer:    Jonelle Sidle Dance Referring Phys: [place]  1. Left ventricular ejection fraction, by estimation, is 55 to 60%. The left ventricle has normal function. The left ventricle has no regional wall motion abnormalities. There is mild left ventricular hypertrophy. Left ventricular diastolic parameters are indeterminate.  2. Right ventricular systolic function is normal. The right ventricular size is normal. Tricuspid regurgitation signal is inadequate for assessing PA pressure.  3. There is a trivial pericardial effusion anterior to the right ventricle.  4. The mitral valve is grossly normal. Mild mitral valve regurgitation.  5. The aortic valve is tricuspid. Aortic valve regurgitation is not visualized.  6. The inferior vena cava is dilated in size with >50% respiratory variability, suggesting right atrial pressure of 8 mmHg. FINDINGS  Left Ventricle: Left ventricular ejection fraction, by estimation, is 55 to 60%. The left ventricle has normal function. The left ventricle has no regional wall motion abnormalities. The left ventricular internal cavity size was normal in size. There is  mild left ventricular hypertrophy. Left ventricular diastolic parameters are indeterminate. Right Ventricle: The right ventricular size is normal. No increase in right ventricular wall thickness. Right ventricular systolic function is normal. Tricuspid regurgitation signal is inadequate for assessing PA pressure. Left Atrium: Left atrial size was normal in size. Right Atrium: Right atrial size was normal in size. Pericardium: Trivial pericardial effusion is present. The pericardial effusion is  anterior to the right ventricle. Mitral Valve: The mitral valve is grossly normal. Mild mitral valve regurgitation. Tricuspid Valve: The tricuspid valve is grossly normal. Tricuspid valve regurgitation is trivial. Aortic Valve: The aortic valve is tricuspid. Aortic valve regurgitation is not visualized. Pulmonic Valve: The pulmonic valve was grossly normal. Pulmonic valve regurgitation is trivial. Aorta: The aortic root is normal in size and structure. Venous:  The inferior vena cava is dilated in size with greater than 50% respiratory variability, suggesting right atrial pressure of 8 mmHg. IAS/Shunts: The interatrial septum appears to be lipomatous. No atrial level shunt detected by color flow Doppler.  LEFT VENTRICLE PLAX 2D LVIDd:         3.90 cm  Diastology LVIDs:         2.80 cm  LV e' medial:    7.29 cm/s LV PW:         1.30 cm  LV E/e' medial:  18.1 LV IVS:        1.10 cm  LV e' lateral:   8.38 cm/s LVOT diam:     2.20 cm  LV E/e' lateral: 15.8 LV SV:         89 LV SV Index:   37 LVOT Area:     3.80 cm  RIGHT VENTRICLE             IVC RV Basal diam:  2.50 cm     IVC diam: 2.50 cm RV S prime:     13.70 cm/s TAPSE (M-mode): 2.6 cm LEFT ATRIUM             Index       RIGHT ATRIUM           Index LA diam:        4.40 cm 1.81 cm/m  RA Area:     13.50 cm LA Vol (A2C):   75.5 ml 31.11 ml/m RA Volume:   25.70 ml  10.59 ml/m LA Vol (A4C):   34.5 ml 14.21 ml/m LA Biplane Vol: 55.9 ml 23.03 ml/m  AORTIC VALVE LVOT Vmax:   126.00 cm/s LVOT Vmean:  80.700 cm/s LVOT VTI:    0.235 m  AORTA Ao Root diam: 3.50 cm Ao Asc diam:  3.30 cm MITRAL VALVE MV Area (PHT): 3.21 cm     SHUNTS MV Decel Time: 236 msec     Systemic VTI:  0.24 m MV E velocity: 132.00 cm/s  Systemic Diam: 2.20 cm MV A velocity: 114.00 cm/s MV E/A ratio:  1.16 Rozann Lesches MD Electronically signed by Rozann Lesches MD Signature Date/Time: 07/25/2020/11:07:06 AM    Final    [place] EKG SITE RITE  Result Date: 07/28/2020 If Site Rite image not attached,  placement could not be confirmed due to current cardiac rhythm.  Assessment and Plan:  1.  Mass of the descending colon concerning for malignancy -History of large polyp in the left side of the colon-referred to [facility] in 05/2018 for procedure canceled secondary to COVID-19 pandemic.  Procedure was not rescheduled. -CT chest/abdomen/pelvis with contrast 07/27/2020-3 small pulmonary nodules less than 5 mm favored to be benign, circumferential luminal narrowing of the distal transverse colon concerning for malignancy, no metastatic adenopathy in the mesentery porta hepatis, no for metastasis. -CEA on 07/27/2020 was 17.3 -Colonoscopy performed 07/27/2020 showed a fungating, infiltrative and ulcerated nonobstructing large mass in the proximal descending colon.  Biopsies taken and results are currently pending. 2.  Anemia due to GI bleeding, iron deficiency?,  Renal insufficiency? 3.  New onset acute diastolic CHF 4.  Diabetes mellitus 5.  AKI secondary to diuresis 6.  Hypertension 7.  History of left transmetatarsal amputation 8.  History of colon polyps  Mr. Giammarino has been admitted for symptomatic anemia.  He has a history of a large polyp in the left side of the colon and was referred to [facility] in January 2020 for procedure.  This procedure was canceled secondary to the COVID-19 pandemic and not rescheduled.  Work-up this admission showed narrowing in the transverse colon on CT scan concerning for malignancy.  Colonoscopy showed a fungating, infiltrative and ulcerated nonobstructing large mass in the proximal descending colon.  Biopsies are currently pending.  We discussed the CT scan findings as well as the concern that his colon mass is an underlying malignancy.  Biopsy is currently pending.  Recommend surgical resection of his colon mass.  General surgery following and awaiting cardiology and medical clearance.  Will need final surgical pathology report before we can discuss staging and final treatment  recommendations.  Recommendations: 1.  Await biopsy results from colonoscopy. 2.  Proceed with resection of the colon mass per general surgery. 3.  We will arrange for outpatient follow-up with cancer to discuss his surgical pathology report and make final recommendations regarding treatment options. 4.  Check ferritin level  Thank you for this referral.   Mikey Bussing, DNP, AGPCNP-BC, AOCNP  Jeffrey Young was interviewed and examined.  I reviewed the CT images, colonoscopy report, and pathology report.  He was admitted with new onset CHF and severe anemia.  He has been diagnosed with left-sided colon cancer.  There is no clinical or radiologic evidence of metastatic disease.  He has been seen in consultation by [name] Done and surgery is being planned.  We will see him after surgery to review the final pathology and make adjuvant treatment recommendations.  The anemia is likely secondary to bleeding, renal insufficiency, and he may have iron deficiency.  I was present for greater than 50% of today's visit.  I performed medical decision making.

## 2020-07-30 ENCOUNTER — Encounter (HOSPITAL_COMMUNITY)
Admission: EM | Disposition: A | Discharge status: Home / Self Care | Payer: Self-pay | Source: Home / Self Care | Attending: Internal Medicine

## 2020-07-30 ENCOUNTER — Encounter (HOSPITAL_COMMUNITY): Payer: Self-pay | Admitting: Internal Medicine

## 2020-07-30 ENCOUNTER — Inpatient Hospital Stay (HOSPITAL_COMMUNITY): Payer: 59 | Admitting: Certified Registered Nurse Anesthetist

## 2020-07-30 HISTORY — PX: COLON RESECTION: SHX5231

## 2020-07-30 LAB — BASIC METABOLIC PANEL
Anion gap: 8 (ref 5–15)
BUN: 26 mg/dL — ABNORMAL HIGH (ref 6–20)
CO2: 22 mmol/L (ref 22–32)
Calcium: 8.4 mg/dL — ABNORMAL LOW (ref 8.9–10.3)
Chloride: 102 mmol/L (ref 98–111)
Creatinine, Ser: 1.81 mg/dL — ABNORMAL HIGH (ref 0.61–1.24)
GFR, Estimated: 43 mL/min — ABNORMAL LOW (ref >60)
Glucose, Bld: 197 mg/dL — ABNORMAL HIGH (ref 70–99)
Potassium: 4 mmol/L (ref 3.5–5.1)
Sodium: 132 mmol/L — ABNORMAL LOW (ref 135–145)

## 2020-07-30 LAB — GLUCOSE, CAPILLARY
Glucose-Capillary: 224 mg/dL — ABNORMAL HIGH (ref 70–99)
Glucose-Capillary: 162 mg/dL — ABNORMAL HIGH (ref 70–99)
Glucose-Capillary: 147 mg/dL — ABNORMAL HIGH (ref 70–99)
Glucose-Capillary: 152 mg/dL — ABNORMAL HIGH (ref 70–99)
Glucose-Capillary: 255 mg/dL — ABNORMAL HIGH (ref 70–99)
Glucose-Capillary: 247 mg/dL — ABNORMAL HIGH (ref 70–99)

## 2020-07-30 LAB — CBC
HCT: 25.5 % — ABNORMAL LOW (ref 39.0–52.0)
Hemoglobin: 8.1 g/dL — ABNORMAL LOW (ref 13.0–17.0)
MCH: 27.3 pg (ref 26.0–34.0)
MCHC: 31.8 g/dL (ref 30.0–36.0)
MCV: 85.9 fL (ref 80.0–100.0)
Platelets: 192 10*3/uL (ref 150–400)
RBC: 2.97 MIL/uL — ABNORMAL LOW (ref 4.22–5.81)
RDW: 14.9 % (ref 11.5–15.5)
WBC: 7.1 10*3/uL (ref 4.0–10.5)
nRBC: 0 % (ref 0.0–0.2)

## 2020-07-30 LAB — PREPARE RBC (CROSSMATCH)

## 2020-07-30 SURGERY — COLECTOMY, HAND-ASSISTED, LAPAROSCOPIC
Anesthesia: General

## 2020-07-30 MED ORDER — MEPERIDINE HCL 50 MG/ML IJ SOLN: 6.2500 mg | INTRAMUSCULAR | Status: DC | PRN

## 2020-07-30 MED ORDER — HYDROMORPHONE HCL 1 MG/ML IJ SOLN
0.2500 mg | INTRAMUSCULAR | Status: DC | PRN
  Administered 2020-07-30 (×4): 0.5 mg via INTRAVENOUS

## 2020-07-30 MED ORDER — PROPOFOL 500 MG/50ML IV EMUL
INTRAVENOUS | Status: AC
  Filled 2020-07-30: qty 100

## 2020-07-30 MED ORDER — SUGAMMADEX SODIUM 500 MG/5ML IV SOLN
INTRAVENOUS | Status: AC
  Filled 2020-07-30: qty 5

## 2020-07-30 MED ORDER — MIDAZOLAM HCL 2 MG/2ML IJ SOLN
INTRAMUSCULAR | Status: AC
  Filled 2020-07-30: qty 2

## 2020-07-30 MED ORDER — INSULIN ASPART 100 UNIT/ML ~~LOC~~ SOLN
0.0000 [IU] | SUBCUTANEOUS | Status: DC
  Administered 2020-07-30 – 2020-07-31 (×2): 8 [IU] via SUBCUTANEOUS
  Administered 2020-07-31: 2 [IU] via SUBCUTANEOUS
  Administered 2020-07-31 (×2): 3 [IU] via SUBCUTANEOUS
  Administered 2020-07-31: 5 [IU] via SUBCUTANEOUS
  Administered 2020-08-01: 3 [IU] via SUBCUTANEOUS
  Administered 2020-08-01: 5 [IU] via SUBCUTANEOUS
  Administered 2020-08-01: 2 [IU] via SUBCUTANEOUS
  Administered 2020-08-02: 3 [IU] via SUBCUTANEOUS
  Administered 2020-08-02: 5 [IU] via SUBCUTANEOUS
  Administered 2020-08-02: 2 [IU] via SUBCUTANEOUS
  Administered 2020-08-02: 3 [IU] via SUBCUTANEOUS
  Administered 2020-08-03 (×2): 2 [IU] via SUBCUTANEOUS
  Administered 2020-08-03: 3 [IU] via SUBCUTANEOUS

## 2020-07-30 MED ORDER — PROPOFOL 1000 MG/100ML IV EMUL
INTRAVENOUS | Status: AC
  Filled 2020-07-30: qty 100

## 2020-07-30 MED ORDER — LACTATED RINGERS IR SOLN
Status: DC | PRN
  Administered 2020-07-30: 1000 mL

## 2020-07-30 MED ORDER — TRAMADOL HCL 50 MG PO TABS
50.0000 mg | ORAL_TABLET | Freq: Four times a day (QID) | ORAL | Status: AC | PRN
  Administered 2020-07-30 – 2020-07-31 (×2): 50 mg via ORAL
  Filled 2020-07-30 (×2): qty 1

## 2020-07-30 MED ORDER — 0.9 % SODIUM CHLORIDE (POUR BTL) OPTIME
TOPICAL | Status: DC | PRN
  Administered 2020-07-30 (×2): 1000 mL

## 2020-07-30 MED ORDER — INSULIN ASPART 100 UNIT/ML ~~LOC~~ SOLN
SUBCUTANEOUS | Status: AC
  Filled 2020-07-30: qty 1

## 2020-07-30 MED ORDER — MIDAZOLAM HCL 5 MG/5ML IJ SOLN
INTRAMUSCULAR | Status: DC | PRN
  Administered 2020-07-30: 2 mg via INTRAVENOUS

## 2020-07-30 MED ORDER — KCL IN DEXTROSE-NACL 20-5-0.45 MEQ/L-%-% IV SOLN
INTRAVENOUS | Status: DC
  Filled 2020-07-30: qty 1000

## 2020-07-30 MED ORDER — HYDROMORPHONE HCL 1 MG/ML IJ SOLN
INTRAMUSCULAR | Status: AC
  Filled 2020-07-30: qty 1

## 2020-07-30 MED ORDER — PROPOFOL 10 MG/ML IV BOLUS
INTRAVENOUS | Status: DC | PRN
  Administered 2020-07-30: 150 mg via INTRAVENOUS

## 2020-07-30 MED ORDER — ALBUMIN HUMAN 5 % IV SOLN: INTRAVENOUS | Status: DC | PRN

## 2020-07-30 MED ORDER — SUGAMMADEX SODIUM 200 MG/2ML IV SOLN
INTRAVENOUS | Status: DC | PRN
  Administered 2020-07-30: 300 mg via INTRAVENOUS

## 2020-07-30 MED ORDER — ROCURONIUM BROMIDE 10 MG/ML (PF) SYRINGE
PREFILLED_SYRINGE | INTRAVENOUS | Status: DC | PRN
  Administered 2020-07-30: 60 mg via INTRAVENOUS
  Administered 2020-07-30 (×3): 20 mg via INTRAVENOUS

## 2020-07-30 MED ORDER — PHENYLEPHRINE HCL-NACL 10-0.9 MG/250ML-% IV SOLN
INTRAVENOUS | Status: DC | PRN
  Administered 2020-07-30: 15 ug/min via INTRAVENOUS

## 2020-07-30 MED ORDER — MORPHINE SULFATE (PF) 2 MG/ML IV SOLN
2.0000 mg | Freq: Once | INTRAVENOUS | Status: AC
Start: 2020-07-30 — End: 2020-07-30
  Administered 2020-07-30: 2 mg via INTRAVENOUS
  Filled 2020-07-30: qty 1

## 2020-07-30 MED ORDER — LABETALOL HCL 5 MG/ML IV SOLN
INTRAVENOUS | Status: AC
  Filled 2020-07-30: qty 4

## 2020-07-30 MED ORDER — LIDOCAINE 2% (20 MG/ML) 5 ML SYRINGE
INTRAMUSCULAR | Status: DC | PRN
  Administered 2020-07-30: 100 mg via INTRAVENOUS

## 2020-07-30 MED ORDER — PHENYLEPHRINE HCL (PRESSORS) 10 MG/ML IV SOLN
INTRAVENOUS | Status: AC
  Filled 2020-07-30: qty 6

## 2020-07-30 MED ORDER — PHENYLEPHRINE 40 MCG/ML (10ML) SYRINGE FOR IV PUSH (FOR BLOOD PRESSURE SUPPORT)
PREFILLED_SYRINGE | INTRAVENOUS | Status: AC
  Filled 2020-07-30: qty 30

## 2020-07-30 MED ORDER — BUPIVACAINE LIPOSOME 1.3 % IJ SUSP
20.0000 mL | Freq: Once | INTRAMUSCULAR | Status: AC
  Administered 2020-07-30: 20 mL
  Filled 2020-07-30: qty 20

## 2020-07-30 MED ORDER — HEPARIN SODIUM (PORCINE) 5000 UNIT/ML IJ SOLN
5000.0000 [IU] | Freq: Three times a day (TID) | INTRAMUSCULAR | Status: DC
  Administered 2020-07-30 – 2020-08-03 (×11): 5000 [IU] via SUBCUTANEOUS
  Filled 2020-07-30 (×11): qty 1

## 2020-07-30 MED ORDER — FENTANYL CITRATE (PF) 100 MCG/2ML IJ SOLN
INTRAMUSCULAR | Status: DC | PRN
  Administered 2020-07-30 (×4): 50 ug via INTRAVENOUS

## 2020-07-30 MED ORDER — FENTANYL CITRATE (PF) 100 MCG/2ML IJ SOLN
INTRAMUSCULAR | Status: AC
  Filled 2020-07-30: qty 2

## 2020-07-30 MED ORDER — ROCURONIUM BROMIDE 10 MG/ML (PF) SYRINGE
PREFILLED_SYRINGE | INTRAVENOUS | Status: AC
  Filled 2020-07-30: qty 10

## 2020-07-30 MED ORDER — LACTATED RINGERS IV SOLN: INTRAVENOUS | Status: DC

## 2020-07-30 MED ORDER — LIDOCAINE 2% (20 MG/ML) 5 ML SYRINGE
INTRAMUSCULAR | Status: AC
  Filled 2020-07-30: qty 10

## 2020-07-30 MED ORDER — AMISULPRIDE (ANTIEMETIC) 5 MG/2ML IV SOLN: 10.0000 mg | Freq: Once | INTRAVENOUS | Status: DC | PRN

## 2020-07-30 MED ORDER — SUCCINYLCHOLINE CHLORIDE 200 MG/10ML IV SOSY
PREFILLED_SYRINGE | INTRAVENOUS | Status: AC
  Filled 2020-07-30: qty 30

## 2020-07-30 MED ORDER — DEXAMETHASONE SODIUM PHOSPHATE 10 MG/ML IJ SOLN
INTRAMUSCULAR | Status: AC
  Filled 2020-07-30: qty 1

## 2020-07-30 MED ORDER — EPHEDRINE 5 MG/ML INJ
INTRAVENOUS | Status: AC
  Filled 2020-07-30: qty 30

## 2020-07-30 MED ORDER — CHLORHEXIDINE GLUCONATE 0.12 % MT SOLN
15.0000 mL | Freq: Once | OROMUCOSAL | Status: AC
  Administered 2020-07-30: 15 mL via OROMUCOSAL

## 2020-07-30 MED ORDER — ONDANSETRON HCL 4 MG/2ML IJ SOLN
INTRAMUSCULAR | Status: AC
  Filled 2020-07-30: qty 2

## 2020-07-30 MED ORDER — KCL IN DEXTROSE-NACL 20-5-0.45 MEQ/L-%-% IV SOLN
INTRAVENOUS | Status: AC
  Filled 2020-07-30: qty 1000

## 2020-07-30 MED ORDER — ROCURONIUM BROMIDE 10 MG/ML (PF) SYRINGE
PREFILLED_SYRINGE | INTRAVENOUS | Status: AC
  Filled 2020-07-30: qty 20

## 2020-07-30 MED ORDER — ONDANSETRON HCL 4 MG/2ML IJ SOLN
INTRAMUSCULAR | Status: DC | PRN
  Administered 2020-07-30: 4 mg via INTRAVENOUS

## 2020-07-30 MED ORDER — LABETALOL HCL 5 MG/ML IV SOLN
INTRAVENOUS | Status: DC | PRN
  Administered 2020-07-30: 5 mg via INTRAVENOUS

## 2020-07-30 MED ORDER — DEXAMETHASONE SODIUM PHOSPHATE 10 MG/ML IJ SOLN
INTRAMUSCULAR | Status: DC | PRN
  Administered 2020-07-30: 8 mg via INTRAVENOUS

## 2020-07-30 SURGICAL SUPPLY — 73 items
APPLIER CLIP 5 13 M/L LIGAMAX5 (MISCELLANEOUS)
APPLIER CLIP ROT 10 11.4 M/L (STAPLE)
BLADE EXTENDED COATED 6.5IN (ELECTRODE) ×1 IMPLANT
CABLE HIGH FREQUENCY MONO STRZ (ELECTRODE) ×2 IMPLANT
CELLS DAT CNTRL 66122 CELL SVR (MISCELLANEOUS) ×1 IMPLANT
CHLORAPREP W/TINT 26 (MISCELLANEOUS) ×2 IMPLANT
CLIP APPLIE 5 13 M/L LIGAMAX5 (MISCELLANEOUS) IMPLANT
CLIP APPLIE ROT 10 11.4 M/L (STAPLE) IMPLANT
COUNTER NEEDLE 20 DBL MAG RED (NEEDLE) ×2 IMPLANT
COVER MAYO STAND STRL (DRAPES) ×6 IMPLANT
COVER SURGICAL LIGHT HANDLE (MISCELLANEOUS) ×2 IMPLANT
COVER WAND RF STERILE (DRAPES) IMPLANT
DECANTER SPIKE VIAL GLASS SM (MISCELLANEOUS) ×2 IMPLANT
DERMABOND ADVANCED (GAUZE/BANDAGES/DRESSINGS) ×2
DERMABOND ADVANCED .7 DNX12 (GAUZE/BANDAGES/DRESSINGS) IMPLANT
DRAPE LAPAROSCOPIC ABDOMINAL (DRAPES) ×2 IMPLANT
DRSG OPSITE POSTOP 4X10 (GAUZE/BANDAGES/DRESSINGS) ×1 IMPLANT
DRSG OPSITE POSTOP 4X6 (GAUZE/BANDAGES/DRESSINGS) IMPLANT
DRSG OPSITE POSTOP 4X8 (GAUZE/BANDAGES/DRESSINGS) IMPLANT
ELECT REM PT RETURN 15FT ADLT (MISCELLANEOUS) ×2 IMPLANT
GAUZE SPONGE 4X4 12PLY STRL (GAUZE/BANDAGES/DRESSINGS) IMPLANT
GLOVE BIOGEL M 8.0 STRL (GLOVE) ×4 IMPLANT
GOWN STRL REUS W/TWL XL LVL3 (GOWN DISPOSABLE) ×12 IMPLANT
HANDLE STAPLE EGIA 4 XL (STAPLE) ×1 IMPLANT
HOLDER FOLEY CATH W/STRAP (MISCELLANEOUS) ×1 IMPLANT
KIT TURNOVER KIT A (KITS) ×2 IMPLANT
LEGGING LITHOTOMY PAIR STRL (DRAPES) IMPLANT
LIGASURE IMPACT 36 18CM CVD LR (INSTRUMENTS) ×1 IMPLANT
PACK COLON (CUSTOM PROCEDURE TRAY) ×2 IMPLANT
PAD POSITIONING PINK XL (MISCELLANEOUS) ×2 IMPLANT
PENCIL SMOKE EVACUATOR (MISCELLANEOUS) IMPLANT
PORT LAP GEL ALEXIS MED 5-9CM (MISCELLANEOUS) IMPLANT
PROTECTOR NERVE ULNAR (MISCELLANEOUS) ×4 IMPLANT
RELOAD EGIA 60 MED/THCK PURPLE (STAPLE) ×6 IMPLANT
RELOAD PROXIMATE 75MM BLUE (ENDOMECHANICALS) IMPLANT
RELOAD STAPLE 60 MED/THCK ART (STAPLE) IMPLANT
RELOAD STAPLE 75 3.8 BLU REG (ENDOMECHANICALS) IMPLANT
RETRACTOR WND ALEXIS 18 MED (MISCELLANEOUS) IMPLANT
RTRCTR WOUND ALEXIS 18CM MED (MISCELLANEOUS) ×2
SCISSORS LAP 5X45 EPIX DISP (ENDOMECHANICALS) ×2 IMPLANT
SET IRRIG TUBING LAPAROSCOPIC (IRRIGATION / IRRIGATOR) ×2 IMPLANT
SET TUBE SMOKE EVAC HIGH FLOW (TUBING) ×2 IMPLANT
SLEEVE XCEL OPT CAN 5 100 (ENDOMECHANICALS) ×5 IMPLANT
SPONGE LAP 18X18 RF (DISPOSABLE) ×2 IMPLANT
STAPLER CIRC CVD 29MM 37CM (STAPLE) IMPLANT
STAPLER CVD CUT BL 40 RELOAD (ENDOMECHANICALS) IMPLANT
STAPLER CVD CUT BLU 40 RELOAD (ENDOMECHANICALS) IMPLANT
STAPLER ECHELON POWER CIR 29 (STAPLE) IMPLANT
STAPLER ECHELON POWER CIR 31 (STAPLE) IMPLANT
STAPLER GUN LINEAR PROX 60 (STAPLE) IMPLANT
STAPLER PROXIMATE 75MM BLUE (STAPLE) IMPLANT
STAPLER VISISTAT 35W (STAPLE) ×2 IMPLANT
SUT CHROMIC 3 0 SH 27 (SUTURE) IMPLANT
SUT NOVA NAB DX-16 0-1 5-0 T12 (SUTURE) ×3 IMPLANT
SUT PDS AB 1 CTX 36 (SUTURE) IMPLANT
SUT PDS AB 1 TP1 96 (SUTURE) IMPLANT
SUT PDS AB 4-0 SH 27 (SUTURE) ×2 IMPLANT
SUT PROLENE 2 0 KS (SUTURE) IMPLANT
SUT SILK 2 0 (SUTURE) ×2
SUT SILK 2 0 SH CR/8 (SUTURE) ×2 IMPLANT
SUT SILK 2-0 18XBRD TIE 12 (SUTURE) ×1 IMPLANT
SUT SILK 3 0 (SUTURE) ×2
SUT SILK 3 0 SH CR/8 (SUTURE) ×2 IMPLANT
SUT SILK 3-0 18XBRD TIE 12 (SUTURE) ×1 IMPLANT
SUT VIC AB 4-0 SH 18 (SUTURE) ×2 IMPLANT
SYS LAPSCP GELPORT 120MM (MISCELLANEOUS)
SYSTEM LAPSCP GELPORT 120MM (MISCELLANEOUS) IMPLANT
TOWEL OR NON WOVEN STRL DISP B (DISPOSABLE) ×2 IMPLANT
TRAY FOLEY MTR SLVR 14FR STAT (SET/KITS/TRAYS/PACK) IMPLANT
TRAY FOLEY MTR SLVR 16FR STAT (SET/KITS/TRAYS/PACK) ×1 IMPLANT
TROCAR BLADELESS OPT 5 100 (ENDOMECHANICALS) ×2 IMPLANT
TROCAR XCEL NON-BLD 11X100MML (ENDOMECHANICALS) IMPLANT
TUBING CONNECTING 10 (TUBING) IMPLANT

## 2020-07-30 NOTE — Progress Notes (Signed)
Received report from [facility] for surgery.

## 2020-07-30 NOTE — Transfer of Care (Signed)
Immediate Anesthesia Transfer of Care Note  Patient: Jeffrey Young  Procedure(s) Performed: HAND ASSISTED LAPAROSCOPIC LEFT HEMI COLECTOMY (N/A )  Patient Location: PACU  Anesthesia Type:General  Level of Consciousness: awake, alert , oriented and patient cooperative  Airway & Oxygen Therapy: Patient Spontanous Breathing and Patient connected to face mask oxygen  Post-op Assessment: Report given to RN, Post -op Vital signs reviewed and stable and Patient moving all extremities X 4  Post vital signs: stable  Last Vitals:  Vitals Value Taken Time  BP 173/92 07/30/20 1912  Temp    Pulse 75 07/30/20 1916  Resp 14 07/30/20 1916  SpO2 99 % 07/30/20 1916  Vitals shown include unvalidated device data.  Last Pain:  Vitals:   07/30/20 1339  TempSrc:   PainSc: 0-No pain      Patients Stated Pain Goal: 0 (92/23/00 9794)  Complications: No complications documented.

## 2020-07-30 NOTE — Progress Notes (Signed)
Pt c/o discomfort and pain bladder, ok to dc f-cath order received by [name] Done, DC f-cath without difficulty, 125 cc uop @ 1928  remains pain lower abd and peri area.
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
## 2020-07-30 NOTE — Progress Notes (Signed)
Progress Note  Patient Name: Jeffrey Young Date of Encounter: 07/30/2020  [facility] Cardiologist: Skeet Latch, MD   Subjective   Feeling well physically.  Anxious to be through with his surgery.   Inpatient Medications    Scheduled Meds: . alvimopan  12 mg Oral On Call to OR  . carvedilol  3.125 mg Oral BID WC  . feeding supplement  296 mL Oral Once  . fluticasone  2 spray Each Nare Daily  . heparin  5,000 Units Subcutaneous On Call to OR  . hydrALAZINE  25 mg Oral Q8H  . insulin aspart  0-9 Units Subcutaneous Q4H  . loratadine  10 mg Oral Daily  . polyethylene glycol powder  1 Container Oral Once  . sodium chloride flush  10-40 mL Intracatheter Q12H  . sodium chloride flush  3 mL Intravenous Q12H   Continuous Infusions: . sodium chloride    . cefoTEtan (CEFOTAN) IV     PRN Meds: sodium chloride, acetaminophen, labetalol, ondansetron (ZOFRAN) IV, sodium chloride flush, sodium chloride flush   Vital Signs    Vitals:   07/29/20 1818 07/29/20 2022 07/30/20 0445 07/30/20 1015  BP: (!) 174/88 (!) 165/92 (!) 152/86 116/77  Pulse: 77 69 76 78  Resp: (!) 22 20 20    Temp: 97.7 F (36.5 C) 98.1 F (36.7 C) 98 F (36.7 C)   TempSrc: Oral Oral Oral   SpO2: 100% 99% 100%   Weight:      Height:        Intake/Output Summary (Last 24 hours) at 07/30/2020 1108 Last data filed at 07/29/2020 1700 Gross per 24 hour  Intake 480 ml  Output 500 ml  Net -20 ml   Last 3 Weights 07/29/2020 07/28/2020 07/27/2020  Weight (lbs) 223 lb 15.8 oz 220 lb 10.9 oz 192 lb 4.8 oz  Weight (kg) 101.6 kg 100.1 kg 87.227 kg      Telemetry    Sinus rhythm- Personally Reviewed  ECG    n/a - Personally Reviewed  Physical Exam   VS:  BP 116/77   Pulse 78   Temp 98 F (36.7 C) (Oral)   Resp 20   Ht 6\' 1"  (1.854 m)   Wt 101.6 kg   SpO2 100%   BMI 29.55 kg/m  , BMI Body mass index is 29.55 kg/m. GENERAL:  Well appearing HEENT: Pupils equal round and reactive, fundi not  visualized, oral mucosa unremarkable NECK:  No jugular venous distention, waveform within normal limits, carotid upstroke brisk and symmetric, no bruits LUNGS:  Clear to auscultation bilaterally HEART:  RRR.  PMI not displaced or sustained,S1 and S2 within normal limits, no S3, no S4, no clicks, no rubs, no murmurs ABD:  Flat, positive bowel sounds normal in frequency in pitch, no bruits, no rebound, no guarding, no midline pulsatile mass, no hepatomegaly, no splenomegaly EXT:  2 plus pulses throughout, 1+ LE edema, no cyanosis no clubbing.  Left transmetatarsal amputation SKIN:  No rashes no nodules NEURO:  Cranial nerves II through XII grossly intact, motor grossly intact throughout PSYCH:  Cognitively intact, oriented to person place and time   Labs    High Sensitivity Troponin:  No results for input(s): TROPONINIHS in the last 720 hours.    Chemistry Recent Labs  Lab 07/24/20 2125 07/25/20 1044 07/28/20 0505 07/29/20 0535 07/30/20 0453  NA 136   < > 132* 132* 132*  K 4.3   < > 4.4 3.9 4.0  CL 107   < >  102 102 102  CO2 20*   < > 23 24 22   GLUCOSE 208*   < > 150* 139* 197*  BUN 32*   < > 29* 33* 26*  CREATININE 1.64*   < > 1.85* 2.12* 1.81*  CALCIUM 8.5*   < > 8.8* 8.6* 8.4*  PROT 6.8  --   --   --   --   ALBUMIN 3.3*  --   --   --   --   AST 26  --   --   --   --   ALT 19  --   --   --   --   ALKPHOS 118  --   --   --   --   BILITOT 0.8  --   --   --   --   GFRNONAA 48*   < > 42* 36* 43*  ANIONGAP 9   < > 7 6 8    < > = values in this interval not displayed.     Hematology Recent Labs  Lab 07/28/20 0505 07/29/20 0535 07/30/20 0453  WBC 8.4 7.3 7.1  RBC 3.08* 2.92* 2.97*  HGB 8.4* 8.0* 8.1*  HCT 27.0* 25.3* 25.5*  MCV 87.7 86.6 85.9  MCH 27.3 27.4 27.3  MCHC 31.1 31.6 31.8  RDW 15.0 15.0 14.9  PLT 302 270 192    BNP Recent Labs  Lab 07/24/20 2125  BNP 210.2*     DDimer No results for input(s): DDIMER in the last 168 hours.   Radiology    [place] EKG  SITE RITE  Result Date: 07/28/2020 If Site Rite image not attached, placement could not be confirmed due to current cardiac rhythm.   Cardiac Studies   Echo 07/25/20: 1. Left ventricular ejection fraction, by estimation, is 55 to 60%. The  left ventricle has normal function. The left ventricle has no regional  wall motion abnormalities. There is mild left ventricular hypertrophy.  Left ventricular diastolic parameters  are indeterminate.  2. Right ventricular systolic function is normal. The right ventricular  size is normal. Tricuspid regurgitation signal is inadequate for assessing  PA pressure.  3. There is a trivial pericardial effusion anterior to the right  ventricle.  4. The mitral valve is grossly normal. Mild mitral valve regurgitation.  5. The aortic valve is tricuspid. Aortic valve regurgitation is not  visualized.  6. The inferior vena cava is dilated in size with >50% respiratory  variability, suggesting right atrial pressure of 8 mmHg.   Patient Profile     55 y.o. male with diabetes and prior L transmetatarsal amputation admitted with shortness of breath.  He was found to have acute diastolic heart failure.  Echo this admission revealed LVEF 55 to 60% with mild LVH.    Assessment & Plan    # Acute diastolic heart failure: # Essential hypertension: # Acute on chronic renal failure: Mr. Hunzeker presented volume overloaded and short of breath in the setting of worsening anemia.  Hemoglobin on admission was 6.0.  BNP was 210.  Echo has revealed normal systolic function.  Right atrial pressure was 8 mmHg.  He was initially diuresed with improvement in his symptoms.  However furosemide was then held in the setting of worsening renal function.  He continues have acute on chronic renal failure, though it has stabilized.  Continue to hold furosemide.  Continue hydralazine and carvedilol for now.  Would favor switching to amlodipine or chlorthalidone if renal function  continues to improve.  This would likely be an easier regimen than 3 times daily hydralazine.  # Acute on chronic anemia: # Colonic mass: He was found to have a polyp in 2020.  Due to Covid-19 this was not followed up until this admission.  On colonoscopy 3/8 there is a large, fungating infiltrative and ulcerated nonobstructing mass in the proximal descending colon.  Biopsies were taken. CEA was elevated to 17.3.  Going for surgery today.  # Preoperative risk assessment: Mr. Trice had shortness of breath on admission.  This was likely attributable to his volume overload and anemia.  He has not experienced any chest pain and has been fairly active at baseline.  Given his anemia and need for urgent resection of his likely malignant colonic mass, would not recommend ischemia evaluation, or particularly cardiac cath and stent placement, as this would necessitate dual antiplatelet therapy.  His main operative risk is volume shifts and heart failure exacerbations.  Would attempt to minimize fluid boluses and we will assist with managing his hemodynamics/volume perioperatively.  He is at acceptable risk for surgery today.      For questions or updates, please contact [place] Please consult [email or web address] for contact info under        Signed, Skeet Latch, MD  07/30/2020, 11:08 AM

## 2020-07-30 NOTE — Interval H&P Note (Signed)
History and Physical Interval Note:  07/30/2020 3:04 PM  Jeffrey Young  has presented today for surgery, with the diagnosis of Desending Colon Mass.  The various methods of treatment have been discussed with the patient and family. After consideration of risks, benefits and other options for treatment, the patient has consented to  Procedure(s): HAND ASSISTED LAPAROSCOPIC LEFT HEMI COLECTOMY (N/A) as a surgical intervention.  The patient's history has been reviewed, patient examined, no change in status, stable for surgery.  I have reviewed the patient's chart and labs.  Questions were answered to the patient's satisfaction.     [name]

## 2020-07-30 NOTE — Interval H&P Note (Signed)
History and Physical Interval Note:  07/30/2020 2:37 PM  Jeffrey Young  has presented today for surgery, with the diagnosis of Desending Colon Mass.  The various methods of treatment have been discussed with the patient and family. After consideration of risks, benefits and other options for treatment, the patient has consented to  Procedure(s): HAND ASSISTED LAPAROSCOPIC LEFT HEMI COLECTOMY (N/A) as a surgical intervention.  The patient's history has been reviewed, patient examined, no change in status, stable for surgery.  I have reviewed the patient's chart and labs.  Questions were answered to the patient's satisfaction.     [name]

## 2020-07-30 NOTE — Anesthesia Postprocedure Evaluation (Signed)
Anesthesia Post Note  Patient: Jeffrey Young  Procedure(s) Performed: HAND ASSISTED LAPAROSCOPIC LEFT HEMI COLECTOMY (N/A )     Patient location during evaluation: PACU Anesthesia Type: General Level of consciousness: sedated and patient cooperative Pain management: pain level controlled Vital Signs Assessment: post-procedure vital signs reviewed and stable Respiratory status: spontaneous breathing Cardiovascular status: stable Anesthetic complications: no   No complications documented.  Last Vitals:  Vitals:   07/30/20 2015 07/30/20 2035  BP: (!) 161/84 (!) 166/90  Pulse: 80 86  Resp: 15 15  Temp: 37.1 C 36.9 C  SpO2: 96% 94%    Last Pain:  Vitals:   07/30/20 2015  TempSrc:   PainSc: [place]

## 2020-07-30 NOTE — Anesthesia Procedure Notes (Signed)
Procedure Name: Intubation Date/Time: 07/30/2020 3:18 PM Performed by: Victoriano Lain, CRNA Pre-anesthesia Checklist: Patient identified, Emergency Drugs available, Suction available, Patient being monitored and Timeout performed Patient Re-evaluated:Patient Re-evaluated prior to induction Oxygen Delivery Method: Circle system utilized Preoxygenation: Pre-oxygenation with 100% oxygen Induction Type: IV induction Ventilation: Mask ventilation without difficulty Laryngoscope Size: Mac and 4 Grade View: Grade I Tube type: Oral Tube size: 7.5 mm Number of attempts: 1 Airway Equipment and Method: Stylet Placement Confirmation: ETT inserted through vocal cords under direct vision,  positive ETCO2 and breath sounds checked- equal and bilateral Secured at: 22 cm Tube secured with: Tape Dental Injury: Teeth and Oropharynx as per pre-operative assessment  Comments: Left front tooth visibly loose preop. Tooth remains intact after intubation

## 2020-07-30 NOTE — Progress Notes (Signed)
Inpatient Diabetes Program Recommendations  AACE/ADA: New Consensus Statement on Inpatient Glycemic Control (2015)  Target Ranges:  Prepandial:   less than 140 mg/dL      Peak postprandial:   less than 180 mg/dL (1-2 hours)      Critically ill patients:  140 - 180 mg/dL   Lab Results  Component Value Date   GLUCAP 162 (H) 07/30/2020   HGBA1C 6.7 (H) 07/29/2020    Review of Glycemic Control Results for Jeffrey Young, Jeffrey Young (MRN [id number]) as of 07/30/2020 10:45  Ref. Range 07/29/2020 16:27 07/29/2020 20:18 07/29/2020 23:04 07/29/2020 23:22 07/29/2020 23:49 07/30/2020 04:17 07/30/2020 07:47  Glucose-Capillary Latest Ref Range: 70 - 99 mg/dL 163 (H) Novolog 2 units 275 (H) Novolog 5 units 54 (L) 60 (L) 74 224 (H) 162 (H)   Diabetes history: DM2 Outpatient Diabetes medications: Glargine/Lixisenatide 10 units am + 15 units pm Current orders for Inpatient glycemic control: Novolog 0-9 units q 4 hrs.  Inpatient Diabetes Program Recommendations:   Since patient is eating: -Decrease Novolog correction to tid + hs 0-5 units -May need small amount Lantus 6 units qd Secure chat sent to Dr. Avon Gully.  Thank you, Nani Gasser. Ranald Alessio, RN, MSN, CDE  Diabetes Coordinator Inpatient Glycemic Control Team Team Pager [phone] (8am-5pm) 07/30/2020 10:48 AM

## 2020-07-30 NOTE — Anesthesia Preprocedure Evaluation (Addendum)
Anesthesia Evaluation  Patient identified by MRN, date of birth, ID band Patient awake    Reviewed: Allergy & Precautions, NPO status , Patient's Chart, lab work & pertinent test results  History of Anesthesia Complications Negative for: history of anesthetic complications  Airway Mallampati: III  TM Distance: >3 FB Neck ROM: Full    Dental  (+) Dental Advisory Given, Loose, Missing, Poor Dentition,    Pulmonary neg pulmonary ROS,  07/24/2020 SARS coronavirus NEG   Pulmonary exam normal breath sounds clear to auscultation       Cardiovascular hypertension, Pt. on medications (-) angina+CHF   Rhythm:Regular Rate:Normal  Echo 07/25/2020 1. Left ventricular ejection fraction, by estimation, is 55 to 60%. The left ventricle has normal function. The left ventricle has no regional wall motion abnormalities. There is mild left ventricular hypertrophy. Left ventricular diastolic parameters are indeterminate.  2. Right ventricular systolic function is normal. The right ventricular size is normal. Tricuspid regurgitation signal is inadequate for assessing PA pressure.  3. There is a trivial pericardial effusion anterior to the right  ventricle.  4. The mitral valve is grossly normal. Mild mitral valve regurgitation.  5. The aortic valve is tricuspid. Aortic valve regurgitation is not visualized.  6. The inferior vena cava is dilated in size with >50% respiratory variability, suggesting right atrial pressure of 8 mmHg.    Neuro/Psych negative neurological ROS     GI/Hepatic Neg liver ROS, GERD  Controlled,  Endo/Other  diabetes, Insulin Dependent  Renal/GU Renal InsufficiencyRenal disease (creat 1.65)     Musculoskeletal   Abdominal   Peds  Hematology  (+) Blood dyscrasia (Hb 8.7), anemia ,   Anesthesia Other Findings   Reproductive/Obstetrics                            Anesthesia  Physical  Anesthesia Plan  ASA: III  Anesthesia Plan: General   Post-op Pain Management:    Induction: Intravenous  PONV Risk Score and Plan: 1 and Ondansetron, Treatment may vary due to age or medical condition, Dexamethasone, Midazolam and Diphenhydramine  Airway Management Planned: Oral ETT  Additional Equipment: None  Intra-op Plan:   Post-operative Plan: Extubation in OR  Informed Consent: I have reviewed the patients History and Physical, chart, labs and discussed the procedure including the risks, benefits and alternatives for the proposed anesthesia with the patient or authorized representative who has indicated his/her understanding and acceptance.     Dental advisory given  Plan Discussed with: CRNA  Anesthesia Plan Comments:       Anesthesia Quick Evaluation

## 2020-07-30 NOTE — Progress Notes (Signed)
PROGRESS NOTE    Jeffrey Young  ZHY:[id number] DOB: Feb 08, 1966 DOA: 07/24/2020 PCP: [name], MD   Brief Narrative:  Pleasant 55 year old gentleman history of type 2 diabetes, history of left transmetatarsal amputation of the foot, history of colonic polyps including 30 mm polyp which was unable to be resected on colonoscopy January 2020.  Patient was referred to [name] for resection however all elective procedures were canceled at that time due to the Covid pandemic.  Patient with no prior history of heart failure. Patient presented to the ED with complaints of lightheadedness noted to have a blood glucose level of 34, 5-day history of exertional dyspnea, worsening lower extremity edema, abdominal swelling, cough all which are new.  Patient seen in the ED noted to have systolic blood pressures in the 160s, chest x-ray consistent with pulmonary edema, elevated BNP, elevated creatinine of 1.6, hemoglobin of 6.0.  Patient denied any melanotic stools, hematochezia, hematemesis.  FOBT noted to be positive. 2 units of packed red blood cells ordered for transfusion. GI consulted.   Assessment & Plan:   Principal Problem:   New onset of congestive heart failure (HCC) Active Problems:   DM2 (diabetes mellitus, type 2) (HCC)   Anemia   GIB (gastrointestinal bleeding)   ARF (acute renal failure) (HCC)   Symptomatic anemia   Hypertension   New onset acute diastolic CHF, POA - Echo EF 55-60% - indeterminate diastolic dysfunction - Initially IV Lasix early on during the hospitalization with good urine output; but held in anticipation of colonoscopy and increased creatinine.   -Clinical/symptomatic improvement. -Creatinine minimally elevated in the setting of diuresis but downtrending appropriately. Lab Results  Component Value Date   CREATININE 1.81 (H) 07/30/2020   CREATININE 2.12 (H) 07/29/2020   CREATININE 1.85 (H) 07/28/2020  - Continue Coreg and hydralazine. - Unable to place on the ACE  inhibitor at this time due to renal function.  - Transfuse to keep hemoglobin > 8. - Fasting lipid panel with LDL of 94.  - Cardiology consulted and following -appreciate insight and recommendations  Acute symptomatic anemia/GI bleed Likely in the setting of proximal descending colon adenocarcinoma - Likely subacute on chronic slow GI bleed.  Patient denies any significant melanotic stools or hematochezia.  FOBT positive. Hemoglobin noted to be 6.0 on admission.  Patient transfused total of 3 units packed red blood cells with hemoglobin currently 8.1.   -Patient noted to have had a 30 mm descending colonic polyp in January 2020 that was never removed as all elective surgeries were canceled in January 2020 due to Covid pandemic as patient was referred to [name]. 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  Hemoglobin A1c 7.2.  CBG 113.  Patient currently NPO.  Continue to hold Lantus.  Placed on D5 normal saline at 10 cc/h.  Continue CBG every 4 hours.  SSI.  Follow. Lab Results  Component Value Date   HGBA1C 6.7 (H) 07/29/2020   Acute kidney injury, ongoing - Secondary to above/diuresis - continue to hold diuretics - Follow UOP - creatinine somewhat labile over the past 48h as above.   Unspecified colonic mass - Surgery following - appreciate  insight and recommendations - Patient remains moderate risk patient for moderate risk procedure. Cardiology following as well - volume status likely main concern given above. Will attempt to optimize patient along with cardiology in regards to volume status and creatinine  perioperatively.  Hypertension Better controlled on current regimen of Coreg, hydralazine.  Cardiology following.  DVT prophylaxis: SCDs Code Status: Full Family Communication: Updated patient Disposition:   Status is: Inpatient  Dispo: The patient is from: Home             Anticipated d/c is to: Home pending surgery, resolution of anemia and   symptoms as well as postoperative recovery.   Stable for discharge: No  Difficult to place patient: No   Consultants:   Gastroenterology: [name] 07/25/2020  Cardiology: [name] 07/26/2020  General surgery, [name] Done  Oncology, [name]  Procedures:   Transfusion 3 units packed red blood cells 07/25/2020  2D echo 07/25/2020  Chest x-ray 07/24/2020  Colonoscopy 07/27/2020  Renal ultrasound 07/26/2020  Partial colectomy versus hemicolectomy 07/30/2020  Antimicrobials:   None indicated  Subjective: No acute issues/events overnight, somewhat anxious about surgery later today but denies nausea vomiting diarrhea constipation headache fevers chills chest pain shortness of breath.  Objective: Vitals:   07/29/20 1652 07/29/20 1818 07/29/20 2022 07/30/20 0445  BP: (!) 181/103 (!) 174/88 (!) 165/92 (!) 152/86  Pulse: 76 77 69 76  Resp:  (!) 22 20 20   Temp:  97.7 F (36.5 C) 98.1 F (36.7 C) 98 F (36.7 C)  TempSrc:  Oral Oral Oral  SpO2: 99% 100% 99% 100%  Weight:      Height:        Intake/Output Summary (Last 24 hours) at 07/30/2020 0807 Last data filed at 07/29/2020 1700 Gross per 24 hour  Intake 720 ml  Output 900 ml  Net -180 ml   Filed Weights   07/27/20 0500 07/28/20 0428 07/29/20 0500  Weight: 87.2 kg 100.1 kg 101.6 kg    Examination:  General exam: NAD, resting comfortably in bed Respiratory system: Clear to auscultation bilaterally.  No wheezes, no crackles, no rhonchi.  Normal respiratory effort.  Speaking in full sentences.  No use of accessory muscles.   Cardiovascular system: Regular rate rhythm no  murmurs rubs or gallops.  No JVD. Gastrointestinal system: Abdomen is soft, nontender, nondistended, positive bowel sounds.  No rebound.  No guarding.  Central nervous system: Alert and oriented. No focal neurological deficits. Extremities: Left transmetatarsal amputation foot. Scant edema bilaterally Skin: No rashes, lesions or ulcers Psychiatry: Judgement and insight appear normal. Mood & affect appropriate.     Data Reviewed: I have personally reviewed following labs and imaging studies  CBC: Recent Labs  Lab 07/24/20 2125 07/25/20 1044 07/26/20 0514 07/26/20 1517 07/27/20 0601 07/27/20 1646 07/28/20 0505 07/29/20 0535 07/30/20 0453  WBC 7.2   < > 7.6  --  7.7  --  8.4 7.3 7.1  NEUTROABS 4.7  --  4.5  --  4.7  --   --   --   --   HGB 6.0*   < > 8.3*   < > 8.7* 9.3* 8.4* 8.0* 8.1*  HCT 20.3*   < > 27.4*   < > 27.0* 29.3* 27.0* 25.3* 25.5*  MCV 86.4   < > 90.1  --  86.0  --  87.7 86.6 85.9  PLT 281   < > 292  --  297  --  302 270 192   < > =  values in this interval not displayed.    Basic Metabolic Panel: Recent Labs  Lab 07/26/20 0514 07/27/20 0601 07/28/20 0505 07/29/20 0535 07/30/20 0453  NA 133* 132* 132* 132* 132*  K 3.7 3.9 4.4 3.9 4.0  CL 101 103 102 102 102  CO2 24 22 23 24 22   GLUCOSE 118* 116* 150* 139* 197*  BUN 28* 26* 29* 33* 26*  CREATININE 1.95* 1.65* 1.85* 2.12* 1.81*  CALCIUM 8.6* 8.4* 8.8* 8.6* 8.4*  MG  --  1.9  --   --   --     GFR: Estimated Creatinine Clearance: 56.4 mL/min (A) (by C-G formula based on SCr of 1.81 mg/dL (H)).  Liver Function Tests: Recent Labs  Lab 07/24/20 2125  AST 26  ALT 19  ALKPHOS 118  BILITOT 0.8  PROT 6.8  ALBUMIN 3.3*    CBG: Recent Labs  Lab 07/29/20 2304 07/29/20 2322 07/29/20 2349 07/30/20 0417 07/30/20 0747  GLUCAP 54* 60* 74 224* 162*     Recent Results (from the past 240 hour(s))  Culture, Urine     Status: None   Collection Time: 07/25/20  9:31 AM   Specimen: Urine, Clean Catch   Result Value Ref Range Status   Specimen Description   Final    URINE, CLEAN CATCH Performed at [facility], [place] [address] [place], [place] [address]    Special Requests   Final    URINE, RANDOM Performed at [place] [address], [place], [place] [address]    Culture   Final    NO GROWTH Performed at [facility], [place] [address], [place], [place] [address]    Report Status 07/26/2020 FINAL  Final   Radiology Studies: [place] EKG SITE RITE  Result Date: 07/28/2020 If Site Rite image not attached, placement could not be confirmed due to current cardiac rhythm.   Scheduled Meds: . alvimopan  12 mg Oral On Call to OR  . carvedilol  3.125 mg Oral BID WC  . feeding supplement  296 mL Oral Once  . fluticasone  2 spray Each Nare Daily  . heparin  5,000 Units Subcutaneous On Call to OR  . hydrALAZINE  25 mg Oral Q8H  . insulin aspart  0-9 Units Subcutaneous Q4H  . loratadine  10 mg Oral Daily  . polyethylene glycol powder  1 Container Oral Once  . sodium chloride flush  10-40 mL Intracatheter Q12H  . sodium chloride flush  3 mL Intravenous Q12H   Continuous Infusions: . sodium chloride    . cefoTEtan (CEFOTAN) IV       LOS: 6 days    Time spent: 23 minutes  Little Ishikawa, DO [facility] Pager: Secure chat  07/30/2020, 8:07 AM

## 2020-07-30 NOTE — Progress Notes (Signed)
Patient wife/patient awaiting work note for hospitalization/when to return to work asap.need papers for disability/condition.

## 2020-07-31 ENCOUNTER — Encounter (HOSPITAL_COMMUNITY): Payer: Self-pay | Admitting: Surgery

## 2020-07-31 DIAGNOSIS — C185 Malignant neoplasm of splenic flexure: Secondary | ICD-10-CM

## 2020-07-31 DIAGNOSIS — R0602 Shortness of breath: Secondary | ICD-10-CM | POA: Insufficient documentation

## 2020-07-31 DIAGNOSIS — E11319 Type 2 diabetes mellitus with unspecified diabetic retinopathy without macular edema: Secondary | ICD-10-CM

## 2020-07-31 DIAGNOSIS — M545 Low back pain, unspecified: Secondary | ICD-10-CM | POA: Insufficient documentation

## 2020-07-31 DIAGNOSIS — I5020 Unspecified systolic (congestive) heart failure: Secondary | ICD-10-CM

## 2020-07-31 DIAGNOSIS — D509 Iron deficiency anemia, unspecified: Secondary | ICD-10-CM

## 2020-07-31 DIAGNOSIS — E785 Hyperlipidemia, unspecified: Secondary | ICD-10-CM | POA: Insufficient documentation

## 2020-07-31 DIAGNOSIS — N183 Chronic kidney disease, stage 3 unspecified: Secondary | ICD-10-CM

## 2020-07-31 DIAGNOSIS — E119 Type 2 diabetes mellitus without complications: Secondary | ICD-10-CM

## 2020-07-31 DIAGNOSIS — E1169 Type 2 diabetes mellitus with other specified complication: Secondary | ICD-10-CM

## 2020-07-31 HISTORY — DX: Unspecified systolic (congestive) heart failure: I50.20

## 2020-07-31 HISTORY — DX: Type 2 diabetes mellitus with unspecified diabetic retinopathy without macular edema: E11.319

## 2020-07-31 HISTORY — DX: Hyperlipidemia, unspecified: E78.5

## 2020-07-31 LAB — BASIC METABOLIC PANEL
Anion gap: 7 (ref 5–15)
Anion gap: 7 (ref 5–15)
BUN: 27 mg/dL — ABNORMAL HIGH (ref 6–20)
BUN: 25 mg/dL — ABNORMAL HIGH (ref 6–20)
CO2: 23 mmol/L (ref 22–32)
CO2: 25 mmol/L (ref 22–32)
Calcium: 8.1 mg/dL — ABNORMAL LOW (ref 8.9–10.3)
Calcium: 8.5 mg/dL — ABNORMAL LOW (ref 8.9–10.3)
Chloride: 104 mmol/L (ref 98–111)
Chloride: 105 mmol/L (ref 98–111)
Creatinine, Ser: 2.41 mg/dL — ABNORMAL HIGH (ref 0.61–1.24)
Creatinine, Ser: 2.53 mg/dL — ABNORMAL HIGH (ref 0.61–1.24)
GFR, Estimated: 31 mL/min — ABNORMAL LOW (ref >60)
GFR, Estimated: 29 mL/min — ABNORMAL LOW (ref >60)
Glucose, Bld: 220 mg/dL — ABNORMAL HIGH (ref 70–99)
Glucose, Bld: 164 mg/dL — ABNORMAL HIGH (ref 70–99)
Potassium: 4.6 mmol/L (ref 3.5–5.1)
Potassium: 4.3 mmol/L (ref 3.5–5.1)
Sodium: 134 mmol/L — ABNORMAL LOW (ref 135–145)
Sodium: 137 mmol/L (ref 135–145)

## 2020-07-31 LAB — CBC
HCT: 24.8 % — ABNORMAL LOW (ref 39.0–52.0)
HCT: 26.1 % — ABNORMAL LOW (ref 39.0–52.0)
Hemoglobin: 7.8 g/dL — ABNORMAL LOW (ref 13.0–17.0)
Hemoglobin: 8.1 g/dL — ABNORMAL LOW (ref 13.0–17.0)
MCH: 27.1 pg (ref 26.0–34.0)
MCH: 26.8 pg (ref 26.0–34.0)
MCHC: 31.5 g/dL (ref 30.0–36.0)
MCHC: 31 g/dL (ref 30.0–36.0)
MCV: 86.1 fL (ref 80.0–100.0)
MCV: 86.4 fL (ref 80.0–100.0)
Platelets: 235 10*3/uL (ref 150–400)
Platelets: 267 10*3/uL (ref 150–400)
RBC: 2.88 MIL/uL — ABNORMAL LOW (ref 4.22–5.81)
RBC: 3.02 MIL/uL — ABNORMAL LOW (ref 4.22–5.81)
RDW: 14.8 % (ref 11.5–15.5)
RDW: 15.1 % (ref 11.5–15.5)
WBC: 10.1 10*3/uL (ref 4.0–10.5)
WBC: 10.5 10*3/uL (ref 4.0–10.5)
nRBC: 0 % (ref 0.0–0.2)
nRBC: 0 % (ref 0.0–0.2)

## 2020-07-31 LAB — GLUCOSE, CAPILLARY
Glucose-Capillary: 104 mg/dL — ABNORMAL HIGH (ref 70–99)
Glucose-Capillary: 142 mg/dL — ABNORMAL HIGH (ref 70–99)
Glucose-Capillary: 152 mg/dL — ABNORMAL HIGH (ref 70–99)
Glucose-Capillary: 165 mg/dL — ABNORMAL HIGH (ref 70–99)
Glucose-Capillary: 238 mg/dL — ABNORMAL HIGH (ref 70–99)
Glucose-Capillary: 253 mg/dL — ABNORMAL HIGH (ref 70–99)

## 2020-07-31 MED ORDER — LACTATED RINGERS IV SOLN: INTRAVENOUS | Status: DC

## 2020-07-31 MED ORDER — LACTATED RINGERS IV BOLUS: 1000.0000 mL | Freq: Three times a day (TID) | INTRAVENOUS | Status: AC | PRN

## 2020-07-31 MED ORDER — ONDANSETRON HCL 4 MG/2ML IJ SOLN
4.0000 mg | Freq: Four times a day (QID) | INTRAMUSCULAR | Status: DC | PRN
  Administered 2020-07-31: 4 mg via INTRAVENOUS
  Filled 2020-07-31 (×2): qty 2

## 2020-07-31 MED ORDER — HYDROMORPHONE HCL 1 MG/ML IJ SOLN: 1.0000 mg | INTRAMUSCULAR | Status: DC | PRN

## 2020-07-31 MED ORDER — SODIUM CHLORIDE 0.9 % IV SOLN
25.0000 mg | Freq: Four times a day (QID) | INTRAVENOUS | Status: DC | PRN
  Filled 2020-07-31: qty 1

## 2020-07-31 MED ORDER — LIP MEDEX EX OINT
1.0000 "application " | TOPICAL_OINTMENT | Freq: Two times a day (BID) | CUTANEOUS | Status: DC
  Administered 2020-08-01 – 2020-08-03 (×5): 1 via TOPICAL
  Filled 2020-07-31 (×2): qty 7

## 2020-07-31 MED ORDER — PROMETHAZINE HCL 25 MG/ML IJ SOLN: 25.0000 mg | Freq: Three times a day (TID) | INTRAMUSCULAR | Status: DC | PRN

## 2020-07-31 MED ORDER — ONDANSETRON HCL 4 MG PO TABS: 4.0000 mg | ORAL_TABLET | Freq: Four times a day (QID) | ORAL | Status: DC | PRN

## 2020-07-31 MED ORDER — MAGIC MOUTHWASH
15.0000 mL | Freq: Four times a day (QID) | ORAL | Status: DC | PRN
  Filled 2020-07-31: qty 15

## 2020-07-31 MED ORDER — AMLODIPINE BESYLATE 5 MG PO TABS
5.0000 mg | ORAL_TABLET | Freq: Every day | ORAL | Status: DC
  Administered 2020-07-31 – 2020-08-03 (×4): 5 mg via ORAL
  Filled 2020-07-31 (×4): qty 1

## 2020-07-31 MED ORDER — MORPHINE SULFATE (PF) 2 MG/ML IV SOLN
2.0000 mg | Freq: Once | INTRAVENOUS | Status: AC
  Administered 2020-07-31: 2 mg via INTRAVENOUS
  Filled 2020-07-31: qty 1

## 2020-07-31 MED ORDER — METHOCARBAMOL 1000 MG/10ML IJ SOLN
1000.0000 mg | Freq: Four times a day (QID) | INTRAVENOUS | Status: DC | PRN
  Filled 2020-07-31: qty 10

## 2020-07-31 MED ORDER — ACETAMINOPHEN 500 MG PO TABS
1000.0000 mg | ORAL_TABLET | Freq: Four times a day (QID) | ORAL | Status: DC
  Administered 2020-07-31 – 2020-08-03 (×12): 1000 mg via ORAL
  Filled 2020-07-31 (×12): qty 2

## 2020-07-31 MED ORDER — ALUM & MAG HYDROXIDE-SIMETH 200-200-20 MG/5ML PO SUSP
30.0000 mL | Freq: Four times a day (QID) | ORAL | Status: DC | PRN
  Administered 2020-07-31 – 2020-08-02 (×2): 30 mL via ORAL
  Filled 2020-07-31 (×3): qty 30

## 2020-07-31 MED ORDER — SODIUM CHLORIDE 0.9% FLUSH
10.0000 mL | Freq: Two times a day (BID) | INTRAVENOUS | Status: DC
  Administered 2020-07-31 – 2020-08-03 (×5): 10 mL

## 2020-07-31 MED ORDER — HYDROCODONE-ACETAMINOPHEN 5-325 MG PO TABS
1.0000 | ORAL_TABLET | Freq: Four times a day (QID) | ORAL | Status: DC | PRN
Start: 2020-07-31 — End: 2020-07-31
  Filled 2020-07-31: qty 2

## 2020-07-31 MED ORDER — HYDROMORPHONE HCL 1 MG/ML IJ SOLN
0.5000 mg | INTRAMUSCULAR | Status: DC | PRN
Start: 2020-07-31 — End: 2020-08-03

## 2020-07-31 MED ORDER — MENTHOL 3 MG MT LOZG: 1.0000 | LOZENGE | OROMUCOSAL | Status: DC | PRN

## 2020-07-31 MED ORDER — SODIUM CHLORIDE 0.9 % IV SOLN
2.0000 g | Freq: Two times a day (BID) | INTRAVENOUS | Status: AC
  Administered 2020-07-31: 2 g via INTRAVENOUS
  Filled 2020-07-31: qty 2

## 2020-07-31 MED ORDER — ALVIMOPAN 12 MG PO CAPS
12.0000 mg | ORAL_CAPSULE | Freq: Two times a day (BID) | ORAL | Status: DC
  Administered 2020-07-31 – 2020-08-01 (×3): 12 mg via ORAL
  Filled 2020-07-31 (×3): qty 1

## 2020-07-31 MED ORDER — ENSURE SURGERY PO LIQD
237.0000 mL | Freq: Two times a day (BID) | ORAL | Status: DC
  Administered 2020-07-31 – 2020-08-02 (×4): 237 mL via ORAL
  Filled 2020-07-31 (×8): qty 237

## 2020-07-31 MED ORDER — METOPROLOL TARTRATE 5 MG/5ML IV SOLN
5.0000 mg | Freq: Four times a day (QID) | INTRAVENOUS | Status: DC | PRN
  Filled 2020-07-31: qty 5

## 2020-07-31 MED ORDER — CALCIUM POLYCARBOPHIL 625 MG PO TABS
625.0000 mg | ORAL_TABLET | Freq: Two times a day (BID) | ORAL | Status: DC
  Administered 2020-07-31 – 2020-08-03 (×6): 625 mg via ORAL
  Filled 2020-07-31 (×6): qty 1

## 2020-07-31 MED ORDER — BISACODYL 10 MG RE SUPP: 10.0000 mg | Freq: Two times a day (BID) | RECTAL | Status: DC | PRN

## 2020-07-31 MED ORDER — SODIUM CHLORIDE 0.9% FLUSH: 10.0000 mL | INTRAVENOUS | Status: DC | PRN

## 2020-07-31 MED ORDER — SIMETHICONE 40 MG/0.6ML PO SUSP
40.0000 mg | Freq: Four times a day (QID) | ORAL | Status: DC | PRN
  Filled 2020-07-31: qty 0.6

## 2020-07-31 MED ORDER — ALBUTEROL SULFATE (2.5 MG/3ML) 0.083% IN NEBU: 2.5000 mg | INHALATION_SOLUTION | Freq: Four times a day (QID) | RESPIRATORY_TRACT | Status: DC | PRN

## 2020-07-31 MED ORDER — METHOCARBAMOL 500 MG PO TABS
1000.0000 mg | ORAL_TABLET | Freq: Four times a day (QID) | ORAL | Status: DC | PRN
  Administered 2020-07-31 – 2020-08-01 (×3): 1000 mg via ORAL
  Filled 2020-07-31 (×4): qty 2

## 2020-07-31 MED ORDER — DIPHENHYDRAMINE HCL 50 MG/ML IJ SOLN
12.5000 mg | Freq: Four times a day (QID) | INTRAMUSCULAR | Status: DC | PRN
Start: 2020-07-31 — End: 2020-08-03

## 2020-07-31 MED ORDER — PHENOL 1.4 % MT LIQD: 2.0000 | OROMUCOSAL | Status: DC | PRN

## 2020-07-31 NOTE — Progress Notes (Signed)
PROGRESS NOTE    Jeffrey Young  MOQ:[id number] DOB: 1965-11-20 DOA: 07/24/2020 PCP: [name], MD   Brief Narrative:  Pleasant 55 year old gentleman history of type 2 diabetes, history of left transmetatarsal amputation of the foot, history of colonic polyps including 30 mm polyp which was unable to be resected on colonoscopy January 2020.  Patient was referred to [facility] for resection however all elective procedures were canceled at that time due to the Covid pandemic.  Patient with no prior history of heart failure. Patient presented to the ED with complaints of lightheadedness noted to have a blood glucose level of 34, 5-day history of exertional dyspnea, worsening lower extremity edema, abdominal swelling, cough all which are new.  Patient seen in the ED noted to have systolic blood pressures in the 160s, chest x-ray consistent with pulmonary edema, elevated BNP, elevated creatinine of 1.6, hemoglobin of 6.0.  Patient denied any melanotic stools, hematochezia, hematemesis.  FOBT noted to be positive. 2 units of packed red blood cells ordered for transfusion. GI consulted.   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   Acute symptomatic anemia/GI bleed Secondary to colonic adenocarcinoma - Likely in the setting of proximal descending colon adenocarcinoma per biopsy with likely chronic slow GI bleed given no overt melena/BRBPR at admission -Status post transverse colectomy 07/30/2020 with general surgery, appreciate insight recommendations -Diet advancement and pain control per surgical specialist  New onset acute diastolic CHF, POA, stabilizing - Echo EF 55-60% - indeterminate diastolic dysfunction - Initially IV Lasix  early on during the hospitalization with good urine output   - Continue Coreg and hydralazine. - Unable to place on the ACE inhibitor at this time due to renal function.  - Cardiology consulted and following -appreciate insight and recommendations; remains on IVF perioperatively due to poor PO intake - follow closely for volume status  Acute kidney injury, ongoing/labile - Secondary to above/diuresis/perioperatively reduced PO intake - continue to hold diuretics - Follow UOP somewhat low yesterday in the setting of decreased p.o. intake- creatinine somewhat labile over the past 48h - Continue IVF for now - high risk for volume overload as above -if worsening again tomorrow will sideline nephrology for insight and recommendations. Lab Results  Component Value Date   CREATININE 2.41 (H) 07/31/2020   CREATININE 1.81 (H) 07/30/2020   CREATININE 2.12 (H) 07/29/2020    Diabetes mellitus type 2, uncontrolled Patient noted to have had a hypoglycemic spell on admission with a CBG of 34.  Hemoglobin A1c currently 6.7.   Continue D5 in the setting of poor p.o. intake perioperatively  Hypertension Better controlled on current regimen of Coreg, hydralazine.  Cardiology following.  DVT prophylaxis: SCDs Code Status: Full Family Communication: Updated patient Disposition:   Status is: Inpatient  Dispo: The patient is from: Home             Anticipated d/c is to: Home pending surgery, resolution of anemia and symptoms as well as postoperative recovery.   Stable for discharge: No  Difficult to place patient: No   Consultants:   Gastroenterology: [name] 07/25/2020  Cardiology: [name] 07/26/2020  General surgery, [name] Done  Oncology, [name]  Procedures:   Transfusion 3 units  packed red blood cells 07/25/2020  2D echo 07/25/2020  Chest x-ray 07/24/2020  Colonoscopy 07/27/2020  Renal ultrasound 07/26/2020  Partial colectomy versus hemicolectomy 07/30/2020  Antimicrobials:    None indicated  Subjective: No acute issues or events overnight, patient's pain is currently well controlled, he did not sleep well postoperatively but denies shortness of breath, chest pain, nausea vomiting diarrhea constipation headache fevers or chills.  Objective: Vitals:   07/30/20 2035 07/31/20 0013 07/31/20 0402 07/31/20 0500  BP: (!) 166/90 (!) 158/77 (!) 151/87   Pulse: 86 96 93   Resp: 15 18 (!) 22   Temp: 98.4 F (36.9 C) 98.7 F (37.1 C) 98.6 F (37 C)   TempSrc:  Oral Oral   SpO2: 94% 96% 90%   Weight:    103.4 kg  Height:        Intake/Output Summary (Last 24 hours) at 07/31/2020 0734 Last data filed at 07/31/2020 4166 Gross per 24 hour  Intake 2393.33 ml  Output 1075 ml  Net 1318.33 ml   Filed Weights   07/28/20 0428 07/29/20 0500 07/31/20 0500  Weight: 100.1 kg 101.6 kg 103.4 kg    Examination:  General:  Pleasantly resting in bed, No acute distress. HEENT:  Normocephalic atraumatic.  Sclerae nonicteric, noninjected.  Extraocular movements intact bilaterally. Neck:  Without mass or deformity.  Trachea is midline. Lungs:  Clear to auscultate bilaterally without rhonchi, wheeze, or rales. Heart:  Regular rate and rhythm.  Without murmurs, rubs, or gallops. Abdomen:  Soft, minimally tender, midline incision and bandage clean dry intact without overt bleeding Extremities: Without cyanosis, clubbing, edema, left transmetatarsal amputation foot. Vascular:  Dorsalis pedis and posterior tibial pulses palpable bilaterally. Skin:  Warm and dry, no erythema, no ulcerations.   Data Reviewed: I have personally reviewed following labs and imaging studies  CBC: Recent Labs  Lab 07/24/20 2125 07/25/20 1044 07/26/20 0514 07/26/20 1517 07/27/20 0601 07/27/20 1646 07/28/20 0505 07/29/20 0535 07/30/20 0453 07/31/20 0516  WBC 7.2   < > 7.6  --  7.7  --  8.4 7.3 7.1 10.1  NEUTROABS 4.7  --  4.5  --  4.7  --   --   --   --   --   HGB 6.0*   < > 8.3*   < >  8.7* 9.3* 8.4* 8.0* 8.1* 7.8*  HCT 20.3*   < > 27.4*   < > 27.0* 29.3* 27.0* 25.3* 25.5* 24.8*  MCV 86.4   < > 90.1  --  86.0  --  87.7 86.6 85.9 86.1  PLT 281   < > 292  --  297  --  302 270 192 235   < > = values in this interval not displayed.    Basic Metabolic Panel: Recent Labs  Lab 07/27/20 0601 07/28/20 0505 07/29/20 0535 07/30/20 0453 07/31/20 0516  NA 132* 132* 132* 132* 134*  K 3.9 4.4 3.9 4.0 4.6  CL 103 102 102 102 104  CO2 22 23 24 22 23   GLUCOSE 116* 150* 139* 197* 220*  BUN 26* 29* 33* 26* 27*  CREATININE 1.65* 1.85* 2.12* 1.81* 2.41*  CALCIUM 8.4* 8.8* 8.6* 8.4* 8.1*  MG 1.9  --   --   --   --     GFR: Estimated Creatinine Clearance: 42.7 mL/min (A) (by C-G formula based on SCr of 2.41 mg/dL (H)).  Liver Function Tests: Recent Labs  Lab 07/24/20 2125  AST 26  ALT 19  ALKPHOS 118  BILITOT 0.8  PROT 6.8  ALBUMIN 3.3*    CBG: Recent Labs  Lab 07/30/20 1313 07/30/20 1922 07/30/20 2103 07/31/20 0015 07/31/20 0356  GLUCAP 152* 255* 247* 253* 238*     Recent Results (from the past 240 hour(s))  Culture, Urine     Status: None   Collection Time: 07/25/20  9:31 AM   Specimen: Urine, Clean Catch  Result Value Ref Range Status   Specimen Description   Final    URINE, CLEAN CATCH Performed at [facility], [place] [address], [place], [place] [address]    Special Requests   Final    URINE, RANDOM Performed at [facility] [address] [place], [place] [address]    Culture   Final    NO GROWTH Performed at [facility], [place] [address], [place], [place] [address]    Report Status 07/26/2020 FINAL  Final   Radiology Studies: No results found.  Scheduled Meds: . carvedilol  3.125 mg Oral BID WC  . feeding supplement  237 mL Oral BID BM  . fluticasone  2 spray Each Nare Daily  . heparin injection (subcutaneous)  5,000 Units Subcutaneous Q8H  . hydrALAZINE  25 mg Oral Q8H  . insulin aspart   0-15 Units Subcutaneous Q4H  . loratadine  10 mg Oral Daily  . sodium chloride flush  10-40 mL Intracatheter Q12H  . sodium chloride flush  3 mL Intravenous Q12H   Continuous Infusions: . sodium chloride    . cefoTEtan (CEFOTAN) IV    . dextrose 5 % and 0.45 % NaCl with KCl 20 mEq/L Stopped (07/31/20 0629)     LOS: 7 days    Time spent: 39 minutes  Little Ishikawa, DO [facility] Pager: Secure chat  07/31/2020, 7:34 AM

## 2020-07-31 NOTE — Progress Notes (Signed)
Patient family (spouse and patient) requesting for work/disability note from attending physician for his work. Spouse will be arriving in the am between 7a-8a to directly speak to physician.

## 2020-07-31 NOTE — Progress Notes (Signed)
1 Day Post-Op   Subjective/Chief Complaint: Patient had some emesis this morning.  Feels fine now.  Appropriate abdominal soreness.  Has not been out of bed.   Objective: Vital signs in last 24 hours: Temp:  [98 F (36.7 C)-98.8 F (37.1 C)] 98.8 F (37.1 C) (03/12 0749) Pulse Rate:  [74-96] 91 (03/12 0749) Resp:  [14-24] 24 (03/12 0749) BP: (149-189)/(77-100) 157/81 (03/12 0749) SpO2:  [90 %-100 %] 94 % (03/12 0749) Weight:  [103.4 kg] 103.4 kg (03/12 0500) Last BM Date: 07/29/20  Intake/Output from previous day: 03/11 0701 - 03/12 0700 In: 2393.3 [P.O.:200; I.V.:1743.3; IV Piggyback:350] Out: 1075 [Urine:750; Emesis/NG output:250; Blood:75] Intake/Output this shift: No intake/output data recorded.  General appearance: alert and cooperative Resp: clear to auscultation bilaterally Cardio: regular rate and rhythm, S1, S2 normal, no murmur, click, rub or gallop Incision/Wound: Honeycomb dressing in place.  No drainage.  Distended.  Sore around incision without peritonitis.  Lab Results:  Recent Labs    07/30/20 0453 07/31/20 0516  WBC 7.1 10.1  HGB 8.1* 7.8*  HCT 25.5* 24.8*  PLT 192 235   BMET Recent Labs    07/30/20 0453 07/31/20 0516  NA 132* 134*  K 4.0 4.6  CL 102 104  CO2 22 23  GLUCOSE 197* 220*  BUN 26* 27*  CREATININE 1.81* 2.41*  CALCIUM 8.4* 8.1*   PT/INR No results for input(s): LABPROT, INR in the last 72 hours. ABG No results for input(s): PHART, HCO3 in the last 72 hours.  Invalid input(s): PCO2, PO2  Studies/Results: No results found.  Anti-infectives: Anti-infectives (From admission, onward)   Start     Dose/Rate Route Frequency Ordered Stop   07/31/20 1000  cefoTEtan (CEFOTAN) 2 g in sodium chloride 0.9 % 100 mL IVPB        2 g 200 mL/hr over 30 Minutes Intravenous Every 12 hours 07/31/20 0548 07/31/20 2159   07/30/20 0600  cefoTEtan (CEFOTAN) 2 g in sodium chloride 0.9 % 100 mL IVPB        2 g 200 mL/hr over 30 Minutes  Intravenous On call to O.R. 07/29/20 1004 07/30/20 1552   07/29/20 1400  neomycin (MYCIFRADIN) tablet 1,000 mg       "And" Linked Group Details   1,000 mg Oral 3 times per day 07/29/20 1004 07/29/20 2134   07/29/20 1400  metroNIDAZOLE (FLAGYL) tablet 1,000 mg       "And" Linked Group Details   1,000 mg Oral 3 times per day 07/29/20 1004 07/29/20 2134      Assessment/Plan: s/p Procedure(s): HAND ASSISTED LAPAROSCOPIC LEFT HEMI COLECTOMY (N/A)   Ambulate  Continue IV fluids\  On clear liquids for now.  Can advance diet if he tolerates it but would not push it today  DVT prophylaxis-Lovenox  FEN: Continue IV fluids and diet as tolerated      LOS: 7 days    Jeffrey Young [name] 07/31/2020

## 2020-07-31 NOTE — Progress Notes (Signed)
Per NT red black vomit-200 ml Pt not nauseated after vomiting. Pt reports "no"  To passing gas. Bowel sounds active. Pt on room air. Has not ambulated yet post op due to pain.

## 2020-07-31 NOTE — Progress Notes (Signed)
Progress Note  Patient Name: Jeffrey Young Date of Encounter: 07/31/2020  [facility] Cardiologist: Skeet Latch, MD   Subjective   Feeling ok.  Mild surgical pain.  Ambulated without difficulty.  Reports chronic R>L LE edema.   Inpatient Medications    Scheduled Meds: . carvedilol  3.125 mg Oral BID WC  . feeding supplement  237 mL Oral BID BM  . fluticasone  2 spray Each Nare Daily  . heparin injection (subcutaneous)  5,000 Units Subcutaneous Q8H  . hydrALAZINE  25 mg Oral Q8H  . insulin aspart  0-15 Units Subcutaneous Q4H  . loratadine  10 mg Oral Daily  . sodium chloride flush  10-40 mL Intracatheter Q12H  . sodium chloride flush  3 mL Intravenous Q12H   Continuous Infusions: . sodium chloride    . cefoTEtan (CEFOTAN) IV    . dextrose 5 % and 0.45 % NaCl with KCl 20 mEq/L Stopped (07/31/20 0629)   PRN Meds: sodium chloride, acetaminophen, labetalol, ondansetron (ZOFRAN) IV, ondansetron **OR** ondansetron (ZOFRAN) IV, sodium chloride flush, sodium chloride flush   Vital Signs    Vitals:   07/31/20 0013 07/31/20 0402 07/31/20 0500 07/31/20 0749  BP: (!) 158/77 (!) 151/87  (!) 157/81  Pulse: 96 93  91  Resp: 18 (!) 22  (!) 24  Temp: 98.7 F (37.1 C) 98.6 F (37 C)  98.8 F (37.1 C)  TempSrc: Oral Oral  Oral  SpO2: 96% 90%  94%  Weight:   103.4 kg   Height:        Intake/Output Summary (Last 24 hours) at 07/31/2020 1046 Last data filed at 07/31/2020 6269 Gross per 24 hour  Intake 2393.33 ml  Output 1075 ml  Net 1318.33 ml   Last 3 Weights 07/31/2020 07/29/2020 07/28/2020  Weight (lbs) 227 lb 15.3 oz 223 lb 15.8 oz 220 lb 10.9 oz  Weight (kg) 103.4 kg 101.6 kg 100.1 kg      Telemetry    Sinus rhythm- Personally Reviewed  ECG    n/a - Personally Reviewed  Physical Exam   VS:  BP (!) 157/81 (BP Location: Right Arm)   Pulse 91   Temp 98.8 F (37.1 C) (Oral)   Resp (!) 24   Ht 6\' 1"  (1.854 m)   Wt 103.4 kg   SpO2 94%   BMI 30.08 kg/m  ,  BMI Body mass index is 30.08 kg/m. GENERAL:  Well appearing HEENT: Pupils equal round and reactive, fundi not visualized, oral mucosa unremarkable NECK:  No jugular venous distention, waveform within normal limits, carotid upstroke brisk and symmetric, no bruits LUNGS:  Clear to auscultation bilaterally HEART:  RRR.  PMI not displaced or sustained,S1 and S2 within normal limits, no S3, no S4, no clicks, no rubs, no murmurs ABD:  Flat, positive bowel sounds normal in frequency in pitch, no bruits, no rebound, no guarding, no midline pulsatile mass, no hepatomegaly, no splenomegaly EXT:  2 plus pulses throughout, 1+ LE edema, no cyanosis no clubbing.  Left transmetatarsal amputation SKIN:  No rashes no nodules NEURO:  Cranial nerves II through XII grossly intact, motor grossly intact throughout PSYCH:  Cognitively intact, oriented to person place and time   Labs    High Sensitivity Troponin:  No results for input(s): TROPONINIHS in the last 720 hours.    Chemistry Recent Labs  Lab 07/24/20 2125 07/25/20 1044 07/29/20 0535 07/30/20 0453 07/31/20 0516  NA 136   < > 132* 132* 134*  K 4.3   < >  3.9 4.0 4.6  CL 107   < > 102 102 104  CO2 20*   < > 24 22 23   GLUCOSE 208*   < > 139* 197* 220*  BUN 32*   < > 33* 26* 27*  CREATININE 1.64*   < > 2.12* 1.81* 2.41*  CALCIUM 8.5*   < > 8.6* 8.4* 8.1*  PROT 6.8  --   --   --   --   ALBUMIN 3.3*  --   --   --   --   AST 26  --   --   --   --   ALT 19  --   --   --   --   ALKPHOS 118  --   --   --   --   BILITOT 0.8  --   --   --   --   GFRNONAA 48*   < > 36* 43* 31*  ANIONGAP 9   < > 6 8 7    < > = values in this interval not displayed.     Hematology Recent Labs  Lab 07/29/20 0535 07/30/20 0453 07/31/20 0516  WBC 7.3 7.1 10.1  RBC 2.92* 2.97* 2.88*  HGB 8.0* 8.1* 7.8*  HCT 25.3* 25.5* 24.8*  MCV 86.6 85.9 86.1  MCH 27.4 27.3 27.1  MCHC 31.6 31.8 31.5  RDW 15.0 14.9 14.8  PLT 270 192 235    BNP Recent Labs  Lab  07/24/20 2125  BNP 210.2*     DDimer No results for input(s): DDIMER in the last 168 hours.   Radiology    No results found.  Cardiac Studies   Echo 07/25/20: 1. Left ventricular ejection fraction, by estimation, is 55 to 60%. The  left ventricle has normal function. The left ventricle has no regional  wall motion abnormalities. There is mild left ventricular hypertrophy.  Left ventricular diastolic parameters  are indeterminate.  2. Right ventricular systolic function is normal. The right ventricular  size is normal. Tricuspid regurgitation signal is inadequate for assessing  PA pressure.  3. There is a trivial pericardial effusion anterior to the right  ventricle.  4. The mitral valve is grossly normal. Mild mitral valve regurgitation.  5. The aortic valve is tricuspid. Aortic valve regurgitation is not  visualized.  6. The inferior vena cava is dilated in size with >50% respiratory  variability, suggesting right atrial pressure of 8 mmHg.   Patient Profile     55 y.o. male with diabetes and prior L transmetatarsal amputation admitted with shortness of breath.  He was found to have acute diastolic heart failure.  Echo this admission revealed LVEF 55 to 60% with mild LVH.    Assessment & Plan    # Acute diastolic heart failure: # Essential hypertension: # Acute on chronic renal failure: Mr. [place] presented volume overloaded and short of breath in the setting of worsening anemia.  Hemoglobin on admission was 6.0.  BNP was 210.  Echo has revealed normal systolic function.  Right atrial pressure was 8 mmHg.  He was initially diuresed with improvement in his symptoms.  However furosemide was then held in the setting of worsening renal function.  Last dose was 3/7.  He continues have acute on chronic renal failure, slightly worse after surgery.  Continue to hold furosemide.  Continue carvedilol.  Will switch hydralazine to amlodipine 5mg  daily.  # Acute on chronic anemia: #  Colonic mass: He was found to have a polyp in  2020.  Due to Covid-19 this was not followed up until this admission.  On colonoscopy 3/8 there is a large, fungating infiltrative and ulcerated nonobstructing mass in the proximal descending colon.  Biopsies were taken. CEA was elevated to 17.3.  Underwent resection with primary colonic anastamosis on 3/11.      For questions or updates, please contact [place] Please consult [email or web address] for contact info under        Signed, Skeet Latch, MD  07/31/2020, 10:46 AM

## 2020-08-01 LAB — GLUCOSE, CAPILLARY
Glucose-Capillary: 118 mg/dL — ABNORMAL HIGH (ref 70–99)
Glucose-Capillary: 120 mg/dL — ABNORMAL HIGH (ref 70–99)
Glucose-Capillary: 128 mg/dL — ABNORMAL HIGH (ref 70–99)
Glucose-Capillary: 149 mg/dL — ABNORMAL HIGH (ref 70–99)
Glucose-Capillary: 184 mg/dL — ABNORMAL HIGH (ref 70–99)
Glucose-Capillary: 225 mg/dL — ABNORMAL HIGH (ref 70–99)

## 2020-08-01 LAB — BASIC METABOLIC PANEL
Anion gap: 10 (ref 5–15)
BUN: 24 mg/dL — ABNORMAL HIGH (ref 6–20)
CO2: 20 mmol/L — ABNORMAL LOW (ref 22–32)
Calcium: 8.1 mg/dL — ABNORMAL LOW (ref 8.9–10.3)
Chloride: 104 mmol/L (ref 98–111)
Creatinine, Ser: 2.55 mg/dL — ABNORMAL HIGH (ref 0.61–1.24)
GFR, Estimated: 29 mL/min — ABNORMAL LOW (ref >60)
Glucose, Bld: 122 mg/dL — ABNORMAL HIGH (ref 70–99)
Potassium: 4.2 mmol/L (ref 3.5–5.1)
Sodium: 134 mmol/L — ABNORMAL LOW (ref 135–145)

## 2020-08-01 LAB — FOLATE: Folate: 6 ng/mL (ref >5.9)

## 2020-08-01 LAB — CBC
HCT: 24.9 % — ABNORMAL LOW (ref 39.0–52.0)
Hemoglobin: 7.7 g/dL — ABNORMAL LOW (ref 13.0–17.0)
MCH: 27 pg (ref 26.0–34.0)
MCHC: 30.9 g/dL (ref 30.0–36.0)
MCV: 87.4 fL (ref 80.0–100.0)
Platelets: 240 10*3/uL (ref 150–400)
RBC: 2.85 MIL/uL — ABNORMAL LOW (ref 4.22–5.81)
RDW: 15.2 % (ref 11.5–15.5)
WBC: 9.5 10*3/uL (ref 4.0–10.5)
nRBC: 0 % (ref 0.0–0.2)

## 2020-08-01 LAB — IRON AND TIBC
Iron: 9 ug/dL — ABNORMAL LOW (ref 45–182)
Saturation Ratios: 3 % — ABNORMAL LOW (ref 17.9–39.5)
TIBC: 322 ug/dL (ref 250–450)
UIBC: 313 ug/dL

## 2020-08-01 LAB — VITAMIN B12: Vitamin B-12: 411 pg/mL (ref 180–914)

## 2020-08-01 LAB — RETICULOCYTES
Immature Retic Fract: 6.8 % (ref 2.3–15.9)
RBC.: 2.82 MIL/uL — ABNORMAL LOW (ref 4.22–5.81)
Retic Count, Absolute: 50.8 10*3/uL (ref 19.0–186.0)
Retic Ct Pct: 1.8 % (ref 0.4–3.1)

## 2020-08-01 LAB — FERRITIN: Ferritin: 42 ng/mL (ref 24–336)

## 2020-08-01 NOTE — Progress Notes (Signed)
PROGRESS NOTE    Jeffrey Young  GLO:[id number] DOB: March 22, 1966 DOA: 07/24/2020 PCP: [name], MD   Brief Narrative:  Pleasant 55 year old gentleman history of type 2 diabetes, history of left transmetatarsal amputation of the foot, history of colonic polyps including 30 mm polyp which was unable to be resected on colonoscopy January 2020.  Patient was referred to [facility] for resection however all elective procedures were canceled at that time due to the Covid pandemic.  Patient with no prior history of heart failure. Patient presented to the ED with complaints of lightheadedness noted to have a blood glucose level of 34, 5-day history of exertional dyspnea, worsening lower extremity edema, abdominal swelling, cough all which are new.  Patient seen in the ED noted to have systolic blood pressures in the 160s, chest x-ray consistent with pulmonary edema, elevated BNP, elevated creatinine of 1.6, hemoglobin of 6.0.  Patient denied any melanotic stools, hematochezia, hematemesis.  FOBT noted to be positive. 2 units of packed red blood cells ordered for transfusion. GI consulted.  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  Patient underwent transverse colectomy on 07/30/2020 tolerating well, at this time continue pain management, advance diet as tolerated with ultimate disposition likely back home.  Oncology following closely for further insight recommendations on abdominal mass for future treatment, currently awaiting biopsy and pathology results.   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   Diabetic  mononeuropathy associated with type 2 diabetes mellitus (HCC)   Systolic heart failure (HCC)   CKD (chronic kidney disease) stage 3, GFR 30-59 ml/min (HCC)   Insulin-requiring or dependent type II diabetes mellitus ([place])  Acute symptomatic anemia/GI bleed Secondary to colonic adenocarcinoma - Likely in the setting of proximal descending colon adenocarcinoma per biopsy with likely chronic slow GI bleed given no overt melena/BRBPR at admission -Status post transverse colectomy 07/30/2020 with general surgery, appreciate insight recommendations -Pain currently well controlled, continue to advance diet per surgery  New onset acute diastolic CHF, POA, stabilizing - Echo EF 55-60% - indeterminate diastolic dysfunction - Initially IV Lasix early on during the hospitalization with good urine output   - Continue Coreg and hydralazine. - Unable to place on the ACE inhibitor at this time due to renal function.  - Cardiology consulted and following -appreciate insight and recommendations; remains on IVF perioperatively due to poor PO intake - follow closely for volume status  Acute kidney injury, ongoing -stabilizing -Likely secondary to above/diuresis/perioperatively reduced PO intake - continue to hold diuretics - Urine output continues to improve postoperatively - Continue low rate IVF for now and continue to increase p.o. intake as tolerated postoperatively.  Given stabilization of creatinine will hold off on nephrology consult, presume his creatinine will begin to downtrend over the next few days.  If his creatinine worsens or urine output diminishes will ask nephrology for further insight and recommendations. Lab Results  Component Value Date   CREATININE 2.55 (H) 08/01/2020   CREATININE 2.53 (H) 07/31/2020   CREATININE 2.41 (H) 07/31/2020    Diabetes mellitus type 2, uncontrolled Patient noted to have had a hypoglycemic spell on admission with a  CBG of 34.  Hemoglobin A1c currently 6.7.    Continue D5 in the setting of poor p.o. intake and elevated creatinine perioperatively  Hypertension Better controlled on current regimen of Coreg, hydralazine.  Cardiology following.  DVT prophylaxis: Heparin Code Status: Full Family Communication: Updated patient Disposition:   Status is: Inpatient  Dispo: The patient is from: Home             Anticipated d/c is to: Patient hopeful to discharge home pending recovery - may require SNF if post operative deconditioning continues.  Stable for discharge: No  Difficult to place patient: No   Consultants:   Gastroenterology: [name] 07/25/2020  Cardiology: [name] 07/26/2020  General surgery, [name] Done  Oncology, [name]  Procedures:   Transfusion 3 units packed red blood cells 07/25/2020  2D echo 07/25/2020  Chest x-ray 07/24/2020  Colonoscopy 07/27/2020  Renal ultrasound 07/26/2020  Partial colectomy versus hemicolectomy 07/30/2020  Antimicrobials:   None indicated  Subjective: No acute issues or events overnight, pain well controlled, patient requesting advancement of diet which is certainly reasonable given his current tolerance.  Ambulating the hallway today without any difficulty, small bowel movement reported overnight by the patient.  Otherwise denies chest pain shortness of breath nausea vomiting headache fevers or chills.  Objective: Vitals:   07/31/20 1645 07/31/20 2013 08/01/20 0036 08/01/20 0519  BP: 135/83 (!) 151/86 (!) 163/87 (!) 151/81  Pulse: 84 87 97 91  Resp: 20 20 20 18   Temp: 98.4 F (36.9 C) 98.7 F (37.1 C) 99.3 F (37.4 C) 99.2 F (37.3 C)  TempSrc:      SpO2: 93% 95% 95% 92%  Weight:      Height:        Intake/Output Summary (Last 24 hours) at 08/01/2020 0734 Last data filed at 08/01/2020 0400 Gross per 24 hour  Intake 688.07 ml  Output --  Net 688.07 ml   Filed Weights   07/28/20 0428 07/29/20 0500 07/31/20 0500  Weight: 100.1 kg 101.6 kg 103.4 kg     Examination:  General:  Pleasantly resting in bed, No acute distress. HEENT:  Normocephalic atraumatic.  Sclerae nonicteric, noninjected.  Extraocular movements intact bilaterally. Neck:  Without mass or deformity.  Trachea is midline. Lungs:  Clear to auscultate bilaterally without rhonchi, wheeze, or rales. Heart:  Regular rate and rhythm.  Without murmurs, rubs, or gallops. Abdomen:  Soft, minimally tender, midline incision and bandage clean dry intact without overt bleeding Extremities: Without cyanosis, clubbing, edema, left transmetatarsal amputation foot. Vascular:  Dorsalis pedis and posterior tibial pulses palpable bilaterally. Skin:  Warm and dry, no erythema, no ulcerations.   Data Reviewed: I have personally reviewed following labs and imaging studies  CBC: Recent Labs  Lab 07/26/20 0514 07/26/20 1517 07/27/20 0601 07/27/20 1646 07/29/20 0535 07/30/20 0453 07/31/20 0516 07/31/20 1221 08/01/20 0536  WBC 7.6  --  7.7   < > 7.3 7.1 10.1 10.5 9.5  NEUTROABS 4.5  --  4.7  --   --   --   --   --   --   HGB 8.3*   < > 8.7*   < > 8.0* 8.1* 7.8* 8.1* 7.7*  HCT 27.4*   < > 27.0*   < > 25.3* 25.5* 24.8* 26.1* 24.9*  MCV 90.1  --  86.0   < > 86.6 85.9 86.1 86.4 87.4  PLT 292  --  297   < > 270 192 235 267 240   < > =  values in this interval not displayed.    Basic Metabolic Panel: Recent Labs  Lab 07/27/20 0601 07/28/20 0505 07/29/20 0535 07/30/20 0453 07/31/20 0516 07/31/20 1221 08/01/20 0536  NA 132*   < > 132* 132* 134* 137 134*  K 3.9   < > 3.9 4.0 4.6 4.3 4.2  CL 103   < > 102 102 104 105 104  CO2 22   < > 24 22 23 25  20*  GLUCOSE 116*   < > 139* 197* 220* 164* 122*  BUN 26*   < > 33* 26* 27* 25* 24*  CREATININE 1.65*   < > 2.12* 1.81* 2.41* 2.53* 2.55*  CALCIUM 8.4*   < > 8.6* 8.4* 8.1* 8.5* 8.1*  MG 1.9  --   --   --   --   --   --    < > = values in this interval not displayed.    GFR: Estimated Creatinine Clearance: 40.4 mL/min (A) (by C-G  formula based on SCr of 2.55 mg/dL (H)).  Liver Function Tests: No results for input(s): AST, ALT, ALKPHOS, BILITOT, PROT, ALBUMIN in the last 168 hours.  CBG: Recent Labs  Lab 07/31/20 1135 07/31/20 1641 07/31/20 2016 08/01/20 0033 08/01/20 0519  GLUCAP 152* 142* 104* 118* 120*     Recent Results (from the past 240 hour(s))  Culture, Urine     Status: None   Collection Time: 07/25/20  9:31 AM   Specimen: Urine, Clean Catch  Result Value Ref Range Status   Specimen Description   Final    URINE, CLEAN CATCH Performed at [facility], [place] [address], [place], [place] [address]    Special Requests   Final    URINE, RANDOM Performed at [facility] [address] [place], [place] [address]    Culture   Final    NO GROWTH Performed at [facility], [place] [address] [place], [place] [address]    Report Status 07/26/2020 FINAL  Final   Radiology Studies: No results found.  Scheduled Meds: . acetaminophen  1,000 mg Oral Q6H  . alvimopan  12 mg Oral BID  . amLODipine  5 mg Oral Daily  . feeding supplement  237 mL Oral BID BM  . heparin injection (subcutaneous)  5,000 Units Subcutaneous Q8H  . insulin aspart  0-15 Units Subcutaneous Q4H  . lip balm  1 application Topical BID  . polycarbophil  625 mg Oral BID  . sodium chloride flush  10-40 mL Intracatheter Q12H   Continuous Infusions: . chlorproMAZINE (THORAZINE) IV    . lactated ringers    . lactated ringers 50 mL/hr at 07/31/20 1800  . methocarbamol (ROBAXIN) IV       LOS: 8 days    Time spent: 45 minutes  Little Ishikawa, DO [facility] Pager: Secure chat  08/01/2020, 7:34 AM

## 2020-08-01 NOTE — Progress Notes (Signed)
Pharmacy Brief Note - Alvimopan (Entereg)  The standing order set for alvimopan (Entereg) now includes an automatic order to discontinue the drug after the patient has had a bowel movement. The change was approved by the [facility] and the Medical Executive Committee.   This patient has had bowel movements documented by nursing. Therefore, alvimopan has been discontinued. If there are questions, please contact the pharmacy at [phone].   Thank you-  Dia Sitter, PharmD, BCPS 08/01/2020 10:09 AM

## 2020-08-01 NOTE — Progress Notes (Signed)
[name] requesting note for work for patient..  Patient was admitted on 07/24/2020.  He is still in the hospital as of 08/01/2020.  Still receiving care therefore date of discharge at this point time unknown but hopefully will be discharged in the next 1 to 2 days.  This serves as note and can be copied and given to patient's wife who will turn into his work at this point time.

## 2020-08-01 NOTE — Progress Notes (Addendum)
2 Days Post-Op   Subjective/Chief Complaint: Patient doing well.  He has had 2 bowel movements.  Tolerating his diet with minimal pain.  He is walking the halls.   Objective: Vital signs in last 24 hours: Temp:  [98.4 F (36.9 C)-99.3 F (37.4 C)] 99.2 F (37.3 C) (03/13 0519) Pulse Rate:  [84-97] 91 (03/13 0519) Resp:  [16-20] 18 (03/13 0519) BP: (135-163)/(74-87) 151/81 (03/13 0519) SpO2:  [92 %-95 %] 92 % (03/13 0519) Last BM Date: 07/31/20  Intake/Output from previous day: 03/12 0701 - 03/13 0700 In: 688.1 [P.O.:240; I.V.:448.1] Out: -  Intake/Output this shift: Total I/O In: -  Out: 700 [Urine:700]  General appearance: alert and cooperative Chest wall: no tenderness Cardio: regular rate and rhythm, S1, S2 normal, no murmur, click, rub or gallop Incision/Wound: Honeycomb incision intact.  Minimal drainage.  Soft nontender.  Lab Results:  Recent Labs    07/31/20 1221 08/01/20 0536  WBC 10.5 9.5  HGB 8.1* 7.7*  HCT 26.1* 24.9*  PLT 267 240   BMET Recent Labs    07/31/20 1221 08/01/20 0536  NA 137 134*  K 4.3 4.2  CL 105 104  CO2 25 20*  GLUCOSE 164* 122*  BUN 25* 24*  CREATININE 2.53* 2.55*  CALCIUM 8.5* 8.1*   PT/INR No results for input(s): LABPROT, INR in the last 72 hours. ABG No results for input(s): PHART, HCO3 in the last 72 hours.  Invalid input(s): PCO2, PO2  Studies/Results: No results found.  Anti-infectives: Anti-infectives (From admission, onward)   Start     Dose/Rate Route Frequency Ordered Stop   07/31/20 1000  cefoTEtan (CEFOTAN) 2 g in sodium chloride 0.9 % 100 mL IVPB        2 g 200 mL/hr over 30 Minutes Intravenous Every 12 hours 07/31/20 0548 07/31/20 1939   07/30/20 0600  cefoTEtan (CEFOTAN) 2 g in sodium chloride 0.9 % 100 mL IVPB        2 g 200 mL/hr over 30 Minutes Intravenous On call to O.R. 07/29/20 1004 07/30/20 1552   07/29/20 1400  neomycin (MYCIFRADIN) tablet 1,000 mg       "And" Linked Group Details   1,000  mg Oral 3 times per day 07/29/20 1004 07/29/20 2134   07/29/20 1400  metroNIDAZOLE (FLAGYL) tablet 1,000 mg       "And" Linked Group Details   1,000 mg Oral 3 times per day 07/29/20 1004 07/29/20 2134      Assessment/Plan: s/p Procedure(s): HAND ASSISTED LAPAROSCOPIC LEFT HEMI COLECTOMY (N/A) Doing well  Continue ambulation  Advance diet  Note dictated in the chart for patient's wife to give to work detailing admission date and present date as requested.  This can be print out and given to patient's wife to submit to his employer.  DVT prophylaxis-SCD, subcu heparin  LOS: 8 days    Joyice Faster Kenesha Moshier 08/01/2020

## 2020-08-02 DIAGNOSIS — I152 Hypertension secondary to endocrine disorders: Secondary | ICD-10-CM

## 2020-08-02 DIAGNOSIS — I5033 Acute on chronic diastolic (congestive) heart failure: Secondary | ICD-10-CM

## 2020-08-02 DIAGNOSIS — E1159 Type 2 diabetes mellitus with other circulatory complications: Secondary | ICD-10-CM

## 2020-08-02 LAB — CBC
HCT: 25.9 % — ABNORMAL LOW (ref 39.0–52.0)
Hemoglobin: 8.1 g/dL — ABNORMAL LOW (ref 13.0–17.0)
MCH: 26.7 pg (ref 26.0–34.0)
MCHC: 31.3 g/dL (ref 30.0–36.0)
MCV: 85.5 fL (ref 80.0–100.0)
Platelets: 286 10*3/uL (ref 150–400)
RBC: 3.03 MIL/uL — ABNORMAL LOW (ref 4.22–5.81)
RDW: 15.2 % (ref 11.5–15.5)
WBC: 8.8 10*3/uL (ref 4.0–10.5)
nRBC: 0 % (ref 0.0–0.2)

## 2020-08-02 LAB — GLUCOSE, CAPILLARY
Glucose-Capillary: 135 mg/dL — ABNORMAL HIGH (ref 70–99)
Glucose-Capillary: 138 mg/dL — ABNORMAL HIGH (ref 70–99)
Glucose-Capillary: 143 mg/dL — ABNORMAL HIGH (ref 70–99)
Glucose-Capillary: 162 mg/dL — ABNORMAL HIGH (ref 70–99)
Glucose-Capillary: 167 mg/dL — ABNORMAL HIGH (ref 70–99)
Glucose-Capillary: 172 mg/dL — ABNORMAL HIGH (ref 70–99)
Glucose-Capillary: 211 mg/dL — ABNORMAL HIGH (ref 70–99)

## 2020-08-02 LAB — BASIC METABOLIC PANEL
Anion gap: 9 (ref 5–15)
BUN: 23 mg/dL — ABNORMAL HIGH (ref 6–20)
CO2: 23 mmol/L (ref 22–32)
Calcium: 8.7 mg/dL — ABNORMAL LOW (ref 8.9–10.3)
Chloride: 104 mmol/L (ref 98–111)
Creatinine, Ser: 2.3 mg/dL — ABNORMAL HIGH (ref 0.61–1.24)
GFR, Estimated: 32 mL/min — ABNORMAL LOW (ref >60)
Glucose, Bld: 149 mg/dL — ABNORMAL HIGH (ref 70–99)
Potassium: 4.1 mmol/L (ref 3.5–5.1)
Sodium: 136 mmol/L (ref 135–145)

## 2020-08-02 MED ORDER — CALCIUM CARBONATE ANTACID 500 MG PO CHEW: 1.0000 | CHEWABLE_TABLET | Freq: Three times a day (TID) | ORAL | Status: DC | PRN

## 2020-08-02 MED ORDER — PANTOPRAZOLE SODIUM 40 MG PO TBEC
40.0000 mg | DELAYED_RELEASE_TABLET | Freq: Two times a day (BID) | ORAL | Status: DC
  Administered 2020-08-02 – 2020-08-03 (×3): 40 mg via ORAL
  Filled 2020-08-02 (×3): qty 1

## 2020-08-02 NOTE — Progress Notes (Signed)
Pt states every time he voids he has to sit to void because stool comes out to. Pt would like to know if this will resolve?

## 2020-08-02 NOTE — Progress Notes (Signed)
Progress Note  3 Days Post-Op  Subjective: Patient reports pain is well controlled. He is having some stool incontinence and loose BMs. He also reports some indigestion/reflux that caused him to vomit this AM. He does not have significant pain or bloating with taking in FLD. He is very concerned about when he will be able to get back to work.   Objective: Vital signs in last 24 hours: Temp:  [98.6 F (37 C)-99.1 F (37.3 C)] 98.9 F (37.2 C) (03/14 0323) Pulse Rate:  [93-95] 93 (03/14 0323) Resp:  [16-20] 20 (03/13 2210) BP: (156-176)/(79-92) 156/91 (03/14 0323) SpO2:  [96 %-100 %] 96 % (03/14 0323) Last BM Date: 08/02/20  Intake/Output from previous day: 03/13 0701 - 03/14 0700 In: 237 [P.O.:237] Out: 1500 [Urine:1500] Intake/Output this shift: No intake/output data recorded.  PE: General: pleasant, WD, obese male who is laying in bed in NAD Heart: regular, rate, and rhythm.   Lungs: CTAB, no wheezes, rhonchi, or rales noted.  Respiratory effort nonlabored Abd: soft, NT, mildly distended, +BS, incisions c/d/i with staples present  MS: all 4 extremities are symmetrical with no cyanosis, clubbing, or edema. Skin: warm and dry with no masses, lesions, or rashes Neuro: Cranial nerves 2-12 grossly intact, sensation is normal throughout Psych: A&Ox3 with an appropriate affect.    Lab Results:  Recent Labs    08/01/20 0536 08/02/20 0455  WBC 9.5 8.8  HGB 7.7* 8.1*  HCT 24.9* 25.9*  PLT 240 286   BMET Recent Labs    08/01/20 0536 08/02/20 0455  NA 134* 136  K 4.2 4.1  CL 104 104  CO2 20* 23  GLUCOSE 122* 149*  BUN 24* 23*  CREATININE 2.55* 2.30*  CALCIUM 8.1* 8.7*   PT/INR No results for input(s): LABPROT, INR in the last 72 hours. CMP     Component Value Date/Time   NA 136 08/02/2020 0455   K 4.1 08/02/2020 0455   CL 104 08/02/2020 0455   CO2 23 08/02/2020 0455   GLUCOSE 149 (H) 08/02/2020 0455   BUN 23 (H) 08/02/2020 0455   CREATININE 2.30 (H)  08/02/2020 0455   CALCIUM 8.7 (L) 08/02/2020 0455   PROT 6.8 07/24/2020 2125   ALBUMIN 3.3 (L) 07/24/2020 2125   AST 26 07/24/2020 2125   ALT 19 07/24/2020 2125   ALKPHOS 118 07/24/2020 2125   BILITOT 0.8 07/24/2020 2125   GFRNONAA 32 (L) 08/02/2020 0455   GFRAA >60 01/26/2018 1339   Lipase  No results found for: LIPASE     Studies/Results: No results found.  Anti-infectives: Anti-infectives (From admission, onward)   Start     Dose/Rate Route Frequency Ordered Stop   07/31/20 1000  cefoTEtan (CEFOTAN) 2 g in sodium chloride 0.9 % 100 mL IVPB        2 g 200 mL/hr over 30 Minutes Intravenous Every 12 hours 07/31/20 0548 07/31/20 1939   07/30/20 0600  cefoTEtan (CEFOTAN) 2 g in sodium chloride 0.9 % 100 mL IVPB        2 g 200 mL/hr over 30 Minutes Intravenous On call to O.R. 07/29/20 1004 07/30/20 1552   07/29/20 1400  neomycin (MYCIFRADIN) tablet 1,000 mg       "And" Linked Group Details   1,000 mg Oral 3 times per day 07/29/20 1004 07/29/20 2134   07/29/20 1400  metroNIDAZOLE (FLAGYL) tablet 1,000 mg       "And" Linked Group Details   1,000 mg Oral 3 times per day  07/29/20 1004 07/29/20 2134       Assessment/Plan Acute DCHF Type II diabetes Left transmetatarsal amputation CKD - Above per TRH -   Adenocarcinoma of the proximal descending colon S/p laparoscopic assisted L hemicolectomy with primary anastomosis 07/30/20 [name] Done  - CEA 17.3  - Colonoscopy w/ bx 3/8: Adenocarcinoma, moderately differentiated   - no hepatic lesions noted on CT abd/pelvis, CT chest with three small pulmonary nodules.  - POD#3 - surgical path pending  - tolerating FLD and having bowel function - advance to soft diet - may be ready for discharge in the next 1-2 days from a surgical standpoint, will start working on follow up   MBB:[id number] diet  ID: [email or web address] pre-op DVT: SCD's, SQH  LOS: 9 days    Norm Parcel , [facility] 08/02/2020, 9:21 AM Please see  Amion for pager number during day hours 7:00am-4:30pm

## 2020-08-02 NOTE — Progress Notes (Signed)
Maloox given at [facility] for heartburn. Pt states before surgery he had heart burn every other day. Heart burn again around 4am but too soon for malox (q6). Asked patient if he would like me to message doctor for different medicine and pt stated "no I can wait". At 0600 after ambulating without pain/nausea patient vomited a large amount in trash can. Pt states it feels like a lump in his chest after throwing up and belches after sipping ginger ale.

## 2020-08-02 NOTE — Progress Notes (Signed)
PROGRESS NOTE    Jeffrey Young  CWC:[id number] DOB: 09/06/1965 DOA: 07/24/2020 PCP: [name], MD   Brief Narrative:  Pleasant 55 year old gentleman history of type 2 diabetes, history of left transmetatarsal amputation of the foot, history of colonic polyps including 30 mm polyp which was unable to be resected on colonoscopy January 2020.  Patient was referred to [facility] for resection however all elective procedures were canceled at that time due to the Covid pandemic.  Patient with no prior history of heart failure. Patient presented to the ED with complaints of lightheadedness noted to have a blood glucose level of 34, 5-day history of exertional dyspnea, worsening lower extremity edema, abdominal swelling, cough all which are new.  Patient seen in the ED noted to have systolic blood pressures in the 160s, chest x-ray consistent with pulmonary edema, elevated BNP, elevated creatinine of 1.6, hemoglobin of 6.0.  Patient denied any melanotic stools, hematochezia, hematemesis.  FOBT noted to be positive. 2 units of packed red blood cells ordered for transfusion. GI consulted.  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  Patient underwent transverse colectomy on 07/30/2020 tolerating well, at this time continue pain management, advance diet as tolerated with ultimate disposition likely back home.  Oncology following closely for further insight recommendations on abdominal mass for future treatment currently awaiting biopsy and pathology results.  Patient admitted as above with acute symptomatic bleed likely GI in etiology, known history of abdominal mass in 2020, unfortunately was unable to follow-up at [facility] for further evaluation and treatment due to Covid pandemic and limitation in outpatient and inpatient procedures.  Patient had been lost to follow-up since that timeframe.  Imaging here continues to show large  abdominal mass, initial biopsy via endoscopy concerning for adenocarcinoma.  Patient now status post partial colectomy as of 07/30/2020 and tolerated the procedure quite well.  Patient's pain is well controlled, diet is being advanced by surgery, likely discharge home in the next few days pending clinical course.   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   Diabetic mononeuropathy associated with type 2 diabetes mellitus (HCC)   Systolic heart failure (HCC)   CKD (chronic kidney disease) stage 3, GFR 30-59 ml/min (HCC)   Insulin-requiring or dependent type II diabetes mellitus (Jeffrey Young)  Acute symptomatic anemia/GI bleed Secondary to colonic adenocarcinoma - Likely in the setting of proximal descending colon adenocarcinoma per biopsy with likely chronic slow GI bleed given no overt melena/BRBPR at admission -Status post transverse colectomy 07/30/2020 with general surgery, appreciate insight recommendations -Pain currently well controlled, continue to advance diet per surgery - attempting to advance to soft diet this afternoon.  New onset acute diastolic CHF, POA, stabilizing - Echo EF 55 - 60% - Initially IV Lasix early on during the hospitalization with good urine output - discontinued due to elevated creatinine - Continue amlodipine - Unable to place on the ACE inhibitor at this time due to renal function.  - Cardiology consulted and following -appreciate insight and recommendations; remains on IVF perioperatively due to poor PO intake - follow closely for volume status  Acute kidney injury, resolving - Likely secondary  to above/diuresis/perioperatively reduced PO intake - continue  to hold diuretics - Urine output continues to improve postoperatively - Continue low rate IVF for now and continue to increase p.o. intake as tolerated postoperatively. Creatinine downtrending appropriately. Lab Results  Component Value Date   CREATININE 2.30 (H) 08/02/2020   CREATININE 2.55 (H) 08/01/2020   CREATININE 2.53 (H) 07/31/2020    Diabetes mellitus type 2, uncontrolled Patient noted to have had a hypoglycemic spell on admission with a CBG of 34.  Hemoglobin A1c currently 6.7.   Continue D5 in the setting of poor p.o. intake and elevated creatinine perioperatively  Hypertension Better controlled on current regimen of Coreg, hydralazine.  Cardiology following.  DVT prophylaxis: Heparin Code Status: Full Family Communication: Updated patient Disposition:   Status is: Inpatient  Dispo: The patient is from: Home             Anticipated d/c is to: Patient hopeful to discharge home pending recovery in 24-48h - may require SNF if post operative deconditioning continues.  Stable for discharge: No  Difficult to place patient: No   Consultants:   Gastroenterology: [name] 07/25/2020  Cardiology: [name] 07/26/2020  General surgery, [name] Done  Oncology, [name]  Procedures:   Transfusion 3 units packed red blood cells 07/25/2020  2D echo 07/25/2020  Chest x-ray 07/24/2020  Colonoscopy 07/27/2020  Renal ultrasound 07/26/2020  Partial colectomy versus hemicolectomy 07/30/2020  Antimicrobials:   None indicated  Subjective: No acute issues or events overnight, pain well controlled, continue to advance diet as tolerated, attempting soft diet per surgery today.  If well controlled patient requesting discharge home tomorrow which is certainly reasonable pending tolerance of p.o. intake.  Objective: Vitals:   08/01/20 0519 08/01/20 1536 08/01/20 2210 08/02/20 0323  BP: (!) 151/81 (!) 176/92 (!) 159/79 (!) 156/91  Pulse: 91 95 95 93  Resp: 18 16 20    Temp: 99.2  F (37.3 C) 99.1 F (37.3 C) 98.6 F (37 C) 98.9 F (37.2 C)  TempSrc:  Oral Oral Oral  SpO2: 92% 100% 99% 96%  Weight:      Height:        Intake/Output Summary (Last 24 hours) at 08/02/2020 0713 Last data filed at 08/01/2020 1200 Gross per 24 hour  Intake 237 ml  Output 1500 ml  Net -1263 ml   Filed Weights   07/28/20 0428 07/29/20 0500 07/31/20 0500  Weight: 100.1 kg 101.6 kg 103.4 kg    Examination:  General:  Pleasantly resting in bed, No acute distress. HEENT:  Normocephalic atraumatic.  Sclerae nonicteric, noninjected.  Extraocular movements intact bilaterally. Neck:  Without mass or deformity.  Trachea is midline. Lungs:  Clear to auscultate bilaterally without rhonchi, wheeze, or rales. Heart:  Regular rate and rhythm.  Without murmurs, rubs, or gallops. Abdomen:  Soft, minimally tender, midline incision and bandage clean dry intact without overt bleeding Extremities: Without cyanosis, clubbing, edema, left transmetatarsal amputation foot. Vascular:  Dorsalis pedis and posterior tibial pulses palpable bilaterally. Skin:  Warm and dry, no erythema, no ulcerations.  Data Reviewed: I have personally reviewed following labs and imaging studies  CBC: Recent Labs  Lab 07/27/20 0601 07/27/20 1646 07/30/20 0453 07/31/20 0516 07/31/20 1221 08/01/20 0536 08/02/20 0455  WBC 7.7   < > 7.1 10.1 10.5 9.5 8.8  NEUTROABS 4.7  --   --   --   --   --   --   HGB 8.7*   < > 8.1* 7.8* 8.1* 7.7* 8.1*  HCT 27.0*   < > 25.5* 24.8* 26.1* 24.9* 25.9*  MCV 86.0   < > 85.9 86.1 86.4 87.4 85.5  PLT 297   < > 192 235 267 240 286   < > = values in this interval not displayed.    Basic Metabolic Panel: Recent Labs  Lab 07/27/20 0601 07/28/20 0505 07/30/20 0453 07/31/20 0516 07/31/20 1221 08/01/20 0536 08/02/20 0455  NA 132*   < > 132* 134* 137 134* 136  K 3.9   < > 4.0 4.6 4.3 4.2 4.1  CL 103   < > 102 104 105 104 104  CO2 22   < > 22 23 25  20* 23  GLUCOSE 116*   < > 197*  220* 164* 122* 149*  BUN 26*   < > 26* 27* 25* 24* 23*  CREATININE 1.65*   < > 1.81* 2.41* 2.53* 2.55* 2.30*  CALCIUM 8.4*   < > 8.4* 8.1* 8.5* 8.1* 8.7*  MG 1.9  --   --   --   --   --   --    < > = values in this interval not displayed.   GFR: Estimated Creatinine Clearance: 44.8 mL/min (A) (by C-G formula based on SCr of 2.3 mg/dL (H)).  Liver Function Tests: No results for input(s): AST, ALT, ALKPHOS, BILITOT, PROT, ALBUMIN in the last 168 hours.  CBG: Recent Labs  Lab 08/01/20 1152 08/01/20 1726 08/01/20 2021 08/02/20 0006 08/02/20 0254  GLUCAP 128* 184* 225* 138* 135*     Recent Results (from the past 240 hour(s))  Culture, Urine     Status: None   Collection Time: 07/25/20  9:31 AM   Specimen: Urine, Clean Catch  Result Value Ref Range Status   Specimen Description   Final    URINE, CLEAN CATCH Performed at [facility], [place] [address], [place], [place] [address]    Special Requests   Final    URINE, RANDOM Performed at [place] [address], [place], [place] [address]    Culture   Final    NO GROWTH Performed at [facility], [place] [address], [place], [place] [address]    Report Status 07/26/2020 FINAL  Final   Radiology Studies: No results found.  Scheduled Meds: . acetaminophen  1,000 mg Oral Q6H  . amLODipine  5 mg Oral Daily  . feeding supplement  237 mL Oral BID BM  . heparin injection (subcutaneous)  5,000 Units Subcutaneous Q8H  . insulin aspart  0-15 Units Subcutaneous Q4H  . lip balm  1 application Topical BID  . polycarbophil  625 mg Oral BID  . sodium chloride flush  10-40 mL Intracatheter Q12H   Continuous Infusions: . chlorproMAZINE (THORAZINE) IV    . lactated ringers    . lactated ringers Stopped (08/01/20 1036)  . methocarbamol (ROBAXIN) IV       LOS: 9 days   Time spent: 45 minutes  Little Ishikawa, DO [facility] Pager: Secure chat  08/02/2020, 7:13 AM

## 2020-08-02 NOTE — Progress Notes (Signed)
Progress Note  Patient Name: Jeffrey Young Date of Encounter: 08/02/2020  [facility] Cardiologist: Skeet Latch, MD   Subjective   Denies any chest pain or SOB.    Inpatient Medications    Scheduled Meds: . acetaminophen  1,000 mg Oral Q6H  . amLODipine  5 mg Oral Daily  . feeding supplement  237 mL Oral BID BM  . heparin injection (subcutaneous)  5,000 Units Subcutaneous Q8H  . insulin aspart  0-15 Units Subcutaneous Q4H  . lip balm  1 application Topical BID  . pantoprazole  40 mg Oral BID  . polycarbophil  625 mg Oral BID  . sodium chloride flush  10-40 mL Intracatheter Q12H   Continuous Infusions: . chlorproMAZINE (THORAZINE) IV    . lactated ringers    . lactated ringers Stopped (08/01/20 1036)   PRN Meds: albuterol, alum & mag hydroxide-simeth, bisacodyl, calcium carbonate, chlorproMAZINE (THORAZINE) IV, diphenhydrAMINE, HYDROmorphone (DILAUDID) injection, lactated ringers, magic mouthwash, menthol-cetylpyridinium, methocarbamol, metoprolol tartrate, ondansetron **OR** ondansetron (ZOFRAN) IV, phenol, simethicone, sodium chloride flush   Vital Signs    Vitals:   08/01/20 0519 08/01/20 1536 08/01/20 2210 08/02/20 0323  BP: (!) 151/81 (!) 176/92 (!) 159/79 (!) 156/91  Pulse: 91 95 95 93  Resp: 18 16 20    Temp: 99.2 F (37.3 C) 99.1 F (37.3 C) 98.6 F (37 C) 98.9 F (37.2 C)  TempSrc:  Oral Oral Oral  SpO2: 92% 100% 99% 96%  Weight:      Height:        Intake/Output Summary (Last 24 hours) at 08/02/2020 1104 Last data filed at 08/01/2020 1200 Gross per 24 hour  Intake 237 ml  Output 800 ml  Net -563 ml   Last 3 Weights 07/31/2020 07/29/2020 07/28/2020  Weight (lbs) 227 lb 15.3 oz 223 lb 15.8 oz 220 lb 10.9 oz  Weight (kg) 103.4 kg 101.6 kg 100.1 kg      Telemetry    NSR- Personally Reviewed  ECG    N/A - Personally Reviewed  Physical Exam   VS:  BP (!) 156/91 (BP Location: Right Arm)   Pulse 93   Temp 98.9 F (37.2 C) (Oral)   Resp  20   Ht 6\' 1"  (1.854 m)   Wt 103.4 kg   SpO2 96%   BMI 30.08 kg/m  , BMI Body mass index is 30.08 kg/m. GEN: Well nourished, well developed in no acute distress HEENT: Normal NECK: No JVD; No carotid bruits LYMPHATICS: No lymphadenopathy CARDIAC:RRR, no murmurs, rubs, gallops RESPIRATORY:  Clear to auscultation without rales, wheezing or rhonchi  ABDOMEN: Soft, non-tender, non-distended MUSCULOSKELETAL:  No edema; No deformity  SKIN: Warm and dry NEUROLOGIC:  Alert and oriented x 3 PSYCHIATRIC:  Normal affect    Labs    High Sensitivity Troponin:  No results for input(s): TROPONINIHS in the last 720 hours.    Chemistry Recent Labs  Lab 07/31/20 1221 08/01/20 0536 08/02/20 0455  NA 137 134* 136  K 4.3 4.2 4.1  CL 105 104 104  CO2 25 20* 23  GLUCOSE 164* 122* 149*  BUN 25* 24* 23*  CREATININE 2.53* 2.55* 2.30*  CALCIUM 8.5* 8.1* 8.7*  GFRNONAA 29* 29* 32*  ANIONGAP 7 10 9      Hematology Recent Labs  Lab 07/31/20 1221 08/01/20 0536 08/02/20 0455  WBC 10.5 9.5 8.8  RBC 3.02* 2.85*  2.82* 3.03*  HGB 8.1* 7.7* 8.1*  HCT 26.1* 24.9* 25.9*  MCV 86.4 87.4 85.5  MCH 26.8  27.0 26.7  MCHC 31.0 30.9 31.3  RDW 15.1 15.2 15.2  PLT 267 240 286    BNP No results for input(s): BNP, PROBNP in the last 168 hours.   DDimer No results for input(s): DDIMER in the last 168 hours.   Radiology    No results found.  Cardiac Studies   Echo 07/25/20: 1. Left ventricular ejection fraction, by estimation, is 55 to 60%. The  left ventricle has normal function. The left ventricle has no regional  wall motion abnormalities. There is mild left ventricular hypertrophy.  Left ventricular diastolic parameters  are indeterminate.  2. Right ventricular systolic function is normal. The right ventricular  size is normal. Tricuspid regurgitation signal is inadequate for assessing  PA pressure.  3. There is a trivial pericardial effusion anterior to the right  ventricle.  4.  The mitral valve is grossly normal. Mild mitral valve regurgitation.  5. The aortic valve is tricuspid. Aortic valve regurgitation is not  visualized.  6. The inferior vena cava is dilated in size with >50% respiratory  variability, suggesting right atrial pressure of 8 mmHg.   Patient Profile     55 y.o. male with diabetes and prior L transmetatarsal amputation admitted with shortness of breath.  He was found to have acute diastolic heart failure.  Echo this admission revealed LVEF 55 to 60% with mild LVH.    Assessment & Plan    # Acute diastolic heart failure: # Essential hypertension: # Acute on chronic renal failure: Mr. Mattie presented volume overloaded and short of breath in the setting of worsening anemia.  Hemoglobin on admission was 6.0.  BNP was 210.  -Echo has revealed normal systolic function.  Right atrial pressure was 8 mmHg.   -He was initially diuresed with improvement in his symptoms.  However furosemide was then held in the setting of worsening renal function.  Last dose was 3/7.   -He continues have acute on chronic renal failure, slightly worse after surgery but slowly improving with holding diuretics (2.5>2.3).   -will continue to hold diuretics due to AKI -BP elevated today at 156/43mmHg -continue amlodipine 5mg  daily which was started yesterday -all notes have stated continue carvedilol but patient currently not on it>>appears to have stopped on 3/12 after having emesis -restart Carvedilol at 3.125mg  BID  # Acute on chronic anemia: # Colonic mass: He was found to have a polyp in 2020.  Due to Covid-19 this was not followed up until this admission.   -On colonoscopy 3/8 there is a large, fungating infiltrative and ulcerated nonobstructing mass in the proximal descending colon.   -Biopsies were taken. CEA was elevated to 17.3.   -Underwent resection with primary colonic anastamosis on 3/11.  I have spent a total of 30 minutes with patient reviewing 2D echo ,  telemetry, EKGs, labs and examining patient as well as establishing an assessment and plan that was discussed with the patient.  > 50% of time was spent in direct patient care.      For questions or updates, please contact [place] Please consult [email or web address] for contact info under        Signed, Fransico Him, MD  08/02/2020, 11:04 AM

## 2020-08-03 ENCOUNTER — Other Ambulatory Visit: Payer: Self-pay

## 2020-08-03 LAB — BPAM RBC
Blood Product Expiration Date: 202204012359
Blood Product Expiration Date: 202204022359
Blood Product Expiration Date: 202204032359
Blood Product Expiration Date: 202204072359
Unit Type and Rh: 6200
Unit Type and Rh: 6200
Unit Type and Rh: 6200
Unit Type and Rh: 6200

## 2020-08-03 LAB — TYPE AND SCREEN
ABO/RH(D): A POS
Antibody Screen: NEGATIVE
Unit division: 0
Unit division: 0
Unit division: 0
Unit division: 0

## 2020-08-03 LAB — BASIC METABOLIC PANEL
Anion gap: 9 (ref 5–15)
BUN: 23 mg/dL — ABNORMAL HIGH (ref 6–20)
CO2: 23 mmol/L (ref 22–32)
Calcium: 8.5 mg/dL — ABNORMAL LOW (ref 8.9–10.3)
Chloride: 104 mmol/L (ref 98–111)
Creatinine, Ser: 2.13 mg/dL — ABNORMAL HIGH (ref 0.61–1.24)
GFR, Estimated: 35 mL/min — ABNORMAL LOW (ref >60)
Glucose, Bld: 159 mg/dL — ABNORMAL HIGH (ref 70–99)
Potassium: 3.7 mmol/L (ref 3.5–5.1)
Sodium: 136 mmol/L (ref 135–145)

## 2020-08-03 LAB — GLUCOSE, CAPILLARY
Glucose-Capillary: 139 mg/dL — ABNORMAL HIGH (ref 70–99)
Glucose-Capillary: 131 mg/dL — ABNORMAL HIGH (ref 70–99)
Glucose-Capillary: 129 mg/dL — ABNORMAL HIGH (ref 70–99)
Glucose-Capillary: 172 mg/dL — ABNORMAL HIGH (ref 70–99)

## 2020-08-03 LAB — CBC
HCT: 26 % — ABNORMAL LOW (ref 39.0–52.0)
Hemoglobin: 8.1 g/dL — ABNORMAL LOW (ref 13.0–17.0)
MCH: 27.2 pg (ref 26.0–34.0)
MCHC: 31.2 g/dL (ref 30.0–36.0)
MCV: 87.2 fL (ref 80.0–100.0)
Platelets: 315 10*3/uL (ref 150–400)
RBC: 2.98 MIL/uL — ABNORMAL LOW (ref 4.22–5.81)
RDW: 15.1 % (ref 11.5–15.5)
WBC: 7.5 10*3/uL (ref 4.0–10.5)
nRBC: 0 % (ref 0.0–0.2)

## 2020-08-03 MED ORDER — AMLODIPINE BESYLATE 5 MG PO TABS: 5.0000 mg | ORAL_TABLET | Freq: Every day | ORAL | 0 refills | Status: DC

## 2020-08-03 MED ORDER — ENSURE SURGERY PO LIQD: 237.0000 mL | Freq: Two times a day (BID) | ORAL | 0 refills | Status: DC

## 2020-08-03 MED ORDER — CALCIUM POLYCARBOPHIL 625 MG PO TABS: 625.0000 mg | ORAL_TABLET | Freq: Two times a day (BID) | ORAL | 0 refills | Status: DC

## 2020-08-03 MED ORDER — PANTOPRAZOLE SODIUM 40 MG PO TBEC: 40.0000 mg | DELAYED_RELEASE_TABLET | Freq: Two times a day (BID) | ORAL | 0 refills | Status: DC

## 2020-08-03 MED ORDER — CARVEDILOL 3.125 MG PO TABS: 3.1250 mg | ORAL_TABLET | Freq: Two times a day (BID) | ORAL | 0 refills | Status: DC

## 2020-08-03 MED ORDER — CARVEDILOL 3.125 MG PO TABS
3.1250 mg | ORAL_TABLET | Freq: Two times a day (BID) | ORAL | Status: DC
  Administered 2020-08-03: 3.125 mg via ORAL
  Filled 2020-08-03: qty 1

## 2020-08-03 NOTE — Progress Notes (Signed)
Unable to draw blood from the midline

## 2020-08-03 NOTE — Discharge Summary (Signed)
Physician Discharge Summary  Jeffrey Young OZH:[id number] DOB: [birth year] DOA: 07/24/2020  PCP: [name], MD  Admit date: 07/24/2020 Discharge date: 08/03/2020  Admitted From: Home Disposition: Same  Recommendations for Outpatient Follow-up:  1. Follow up with PCP in 1-2 weeks 2. Please obtain BMP/CBC in one week 3. Please follow up with surgery as scheduled for staple removal, behavior changes, incision evaluation  4. Follow-up with PCP versus cardiology for reinitiation of diuretics with Lasix   Discharge Condition: Stable CODE STATUS: Full Diet recommendation: Low-salt low-fat diabetic diet  Brief/Interim Summary: Pleasant 55 year old gentleman history of type 2 diabetes, history of left transmetatarsal amputation of the foot, history of colonic polyps including 30 mm polyp which was unable to be resected on colonoscopy January 2020.  Patient was referred to [facility] for resection however all elective procedures were canceled at that time due to the Covid pandemic.  Patient with no prior history of heart failure. Patient presented to the ED with complaints of lightheadedness noted to have a blood glucose level of 34, 5-day history of exertional dyspnea, worsening lower extremity edema, abdominal swelling, cough all which are new.  Patient seen in the ED noted to have systolic blood pressures in the 160s, chest x-ray consistent with pulmonary edema, elevated BNP, elevated creatinine of 1.6, hemoglobin of 6.0.  Patient denied any melanotic stools, hematochezia, hematemesis.  FOBT noted to be positive. 2 units of packed red blood cells ordered for transfusion. GI consulted.  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  Patient underwent transverse colectomy on 07/30/2020 tolerating well, at this time continue pain management, advance diet as tolerated with ultimate disposition likely back home.  Oncology  following closely for further insight recommendations on abdominal mass for future treatment currently awaiting biopsy and pathology results.  Patient admitted as above with acute symptomatic bleed likely GI in etiology, known history of abdominal mass in 2020, unfortunately was unable to follow-up at [facility] for further evaluation and treatment due to Covid pandemic and limitation in outpatient and inpatient procedures.  Patient had been lost to follow-up since that timeframe.  Imaging here continues to show large abdominal mass, initial biopsy via endoscopy concerning for adenocarcinoma.  Patient now status post partial colectomy as of 07/30/2020 and tolerated the procedure quite well.  Patient's pain is well controlled, diet is being advanced by surgery, patient's pain is well controlled, no issues making urine or having bowel movement, tolerating p.o. well, ambulating without difficulty otherwise stable and agreeable for discharge home.  Close follow-up in the next 1 to 2 weeks with PCP and surgery for wound care evaluation as well as labs to ensure resolution of AKI, will likely need to reinitiate diuretics in the next week to 2 weeks pending creatinine.  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   Diabetic mononeuropathy associated with type 2 diabetes mellitus (HCC)   Systolic heart failure (HCC)   CKD (chronic kidney disease) stage 3, GFR 30-59 ml/min (HCC)   Insulin-requiring or dependent type II diabetes mellitus [facility])    Discharge Instructions  Discharge Instructions    Call MD for:  redness, tenderness, or signs of  infection (pain, swelling, redness, odor or green/yellow  discharge around incision site)   Complete by: As directed    Call MD for:  severe uncontrolled pain   Complete by: As directed    Call MD for:  temperature >100.4   Complete by: As directed    Diet - low sodium heart healthy   Complete by: As directed    Discharge wound care:   Complete by: As directed    Per surgery - follow up for staple removal/bandage changes as scheduled   Increase activity slowly   Complete by: As directed      Allergies as of 08/03/2020      Reactions   Bee Venom Swelling   Cold Sweats   Latex Rash      Medication List    TAKE these medications   amLODipine 5 MG tablet Commonly known as: NORVASC Take 1 tablet (5 mg total) by mouth daily. Start taking on: August 04, 2020   atorvastatin 10 MG tablet Commonly known as: LIPITOR Take 10 mg by mouth daily.   carvedilol 3.125 MG tablet Commonly known as: COREG Take 1 tablet (3.125 mg total) by mouth 2 (two) times daily with a meal.   feeding supplement Liqd Take 237 mLs by mouth 2 (two) times daily between meals.   Insulin Glargine-Lixisenatide 100-33 UNT-MCG/ML Sopn Inject 10-15 Units into the skin See admin instructions. Inject 10u under the skin in the morning and 15u under the in the evening   pantoprazole 40 MG tablet Commonly known as: PROTONIX Take 1 tablet (40 mg total) by mouth 2 (two) times daily.   polycarbophil 625 MG tablet Commonly known as: FIBERCON Take 1 tablet (625 mg total) by mouth 2 (two) times daily.            Discharge Care Instructions  (From admission, onward)         Start     Ordered   08/03/20 0000  Discharge wound care:       Comments: Per surgery - follow up for staple removal/bandage changes as scheduled   08/03/20 1121          Follow-up Information    Surgery, [place]. Go on 08/12/2020.   Specialty: General Surgery Why: Appointment for staple removal scheduled for 10:00 AM. Please arrive 30 min prior to appointment time. Bring photo ID  and insurance information.  Contact information: [address] [place] [address] [phone]        Surgery, [place]. Go on 08/19/2020.   Specialty: General Surgery Why: Follow up appointment 9:30 AM. Please arrive 20 min prior to appointment time for check in. Bring photo ID and insurance information.  Contact information: [address] [place] [phone]              Allergies  Allergen Reactions  . Bee Venom Swelling    Cold Sweats  . Latex Rash    Consultations:  General surgery, cardiology   Procedures/Studies: DG Chest 2 View  Result Date: 07/24/2020 CLINICAL DATA:  Shortness of breath, lower extremity swelling, hyperglycemia EXAM: CHEST - 2 VIEW COMPARISON:  01/21/2018 FINDINGS: Diffuse hazy interstitial opacities throughout the lungs with peripheral septal and fissural thickening. Bilateral pleural effusions. No pneumothorax. Congested pulmonary vascularity. Cardiac silhouette appears enlarged from comparison radiograph accounting for differences in technique. Remaining cardiomediastinal contours are unremarkable. No acute osseous or soft tissue abnormality. IMPRESSION: Findings consistent with CHF including cardiomegaly, vascular congestion, interstitial  edema and bilateral pleural effusions. Electronically Signed   By: Lovena Le M.D.   On: 07/24/2020 19:11   CT CHEST W CONTRAST  Result Date: 07/28/2020 CLINICAL DATA:  Colorectal cancer.  Rectal bleeding EXAM: CT CHEST, ABDOMEN, AND PELVIS WITH CONTRAST TECHNIQUE: Multidetector CT imaging of the chest, abdomen and pelvis was performed following the standard protocol during bolus administration of intravenous contrast. CONTRAST:  181mL OMNIPAQUE IOHEXOL 300 MG/ML  SOLN COMPARISON:  None available FINDINGS: CT CHEST FINDINGS Cardiovascular: No significant vascular findings. Normal heart size. No pericardial effusion. Mediastinum/Nodes: No axillary or supraclavicular  adenopathy. No mediastinal or hilar adenopathy. No pericardial fluid. Esophagus normal. Lungs/Pleura: Smudgy nodule in the LEFT lung apex measures 3 mm on image 36/7). Linear scarring at the RIGHT lung apex with a nodular focus measuring 3 mm on image 25/7. Subpleural nodule in the RIGHT upper lobe on image 80/7. Small bilateral pleural effusions with passive atelectasis lung bases. Musculoskeletal: No aggressive osseous lesion. CT ABDOMEN AND PELVIS FINDINGS Hepatobiliary: No focal hepatic lesion. No biliary ductal dilatation. Gallbladder is normal. Common bile duct is normal. Pancreas: Pancreas is normal. No ductal dilatation. No pancreatic inflammation. Spleen: Normal spleen Adrenals/urinary tract: Adrenal glands normal. Low-density lesion in the RIGHT kidney is favored benign. Ureters and bladder normal Stomach/Bowel: Small hiatal hernia. Stomach duodenum and small bowel normal oral contrast transits to the ascending colon. The neck is normal. Ascending transverse colon normal. In the distal transverse colon there is a segment of circumferential narrowing measuring 2 cm image 67/2. There is collapse of the colon through the splenic flexure. Asymmetric mucosal thickening in the descending proximal descending colon (image 73/2). Distal descending colon and rectosigmoid colon normal. No evidence abnormal lymph nodes within the colon mesentery. No retroperitoneal nodes. Vascular/Lymphatic: Abdominal aorta is normal caliber. There is no retroperitoneal or periportal lymphadenopathy. No pelvic lymphadenopathy. Reproductive: Prostate normal Other: No peritoneal disease Musculoskeletal: No aggressive osseous lesion. IMPRESSION: Chest Impression: 1. Three small pulmonary nodules less than 5 mm. Favor benign nodules. Recommend correlation with tumor surgical pathology. Recommend attention on follow-up. 2. No mediastinal adenopathy. Abdomen / Pelvis Impression: 1. Circumferential luminal narrowing in the distal transverse  colon is nonspecific but may represent the malignancy cyst identified on colonoscopy in the LEFT colon. Asymmetric thickening within the descending colon is also noted. 2. No evidence of metastatic adenopathy in the mesentery porta hepatis. 3. No liver metastasis. Electronically Signed   By: Suzy Bouchard M.D.   On: 07/28/2020 08:54   CT ABDOMEN PELVIS W CONTRAST  Result Date: 07/28/2020 CLINICAL DATA:  Colorectal cancer.  Rectal bleeding EXAM: CT CHEST, ABDOMEN, AND PELVIS WITH CONTRAST TECHNIQUE: Multidetector CT imaging of the chest, abdomen and pelvis was performed following the standard protocol during bolus administration of intravenous contrast. CONTRAST:  128mL OMNIPAQUE IOHEXOL 300 MG/ML  SOLN COMPARISON:  None available FINDINGS: CT CHEST FINDINGS Cardiovascular: No significant vascular findings. Normal heart size. No pericardial effusion. Mediastinum/Nodes: No axillary or supraclavicular adenopathy. No mediastinal or hilar adenopathy. No pericardial fluid. Esophagus normal. Lungs/Pleura: Smudgy nodule in the LEFT lung apex measures 3 mm on image 36/7). Linear scarring at the RIGHT lung apex with a nodular focus measuring 3 mm on image 25/7. Subpleural nodule in the RIGHT upper lobe on image 80/7. Small bilateral pleural effusions with passive atelectasis lung bases. Musculoskeletal: No aggressive osseous lesion. CT ABDOMEN AND PELVIS FINDINGS Hepatobiliary: No focal hepatic lesion. No biliary ductal dilatation. Gallbladder is normal. Common bile duct is normal. Pancreas: Pancreas is normal.  No ductal dilatation. No pancreatic inflammation. Spleen: Normal spleen Adrenals/urinary tract: Adrenal glands normal. Low-density lesion in the RIGHT kidney is favored benign. Ureters and bladder normal Stomach/Bowel: Small hiatal hernia. Stomach duodenum and small bowel normal oral contrast transits to the ascending colon. The neck is normal. Ascending transverse colon normal. In the distal transverse colon  there is a segment of circumferential narrowing measuring 2 cm image 67/2. There is collapse of the colon through the splenic flexure. Asymmetric mucosal thickening in the descending proximal descending colon (image 73/2). Distal descending colon and rectosigmoid colon normal. No evidence abnormal lymph nodes within the colon mesentery. No retroperitoneal nodes. Vascular/Lymphatic: Abdominal aorta is normal caliber. There is no retroperitoneal or periportal lymphadenopathy. No pelvic lymphadenopathy. Reproductive: Prostate normal Other: No peritoneal disease Musculoskeletal: No aggressive osseous lesion. IMPRESSION: Chest Impression: 1. Three small pulmonary nodules less than 5 mm. Favor benign nodules. Recommend correlation with tumor surgical pathology. Recommend attention on follow-up. 2. No mediastinal adenopathy. Abdomen / Pelvis Impression: 1. Circumferential luminal narrowing in the distal transverse colon is nonspecific but may represent the malignancy cyst identified on colonoscopy in the LEFT colon. Asymmetric thickening within the descending colon is also noted. 2. No evidence of metastatic adenopathy in the mesentery porta hepatis. 3. No liver metastasis. Electronically Signed   By: Suzy Bouchard M.D.   On: 07/28/2020 08:54   US RENAL  Result Date: 07/26/2020 CLINICAL DATA:  Acute kidney injury. EXAM: RENAL / URINARY TRACT ULTRASOUND COMPLETE COMPARISON:  None. FINDINGS: Right Kidney: Renal measurements: 11.8 x 5.8 x 5.9 cm = volume: 211 mL. Parenchymal echogenicity within normal limits. There is a 2.1 x 2.3 x 1.7 cm cyst in the mid kidney. No solid mass or hydronephrosis visualized. Left Kidney: Renal measurements: 12.0 x 7.4 x 4.9 cm = volume: 229 mL. Parenchymal echogenicity within normal limits. No mass or hydronephrosis visualized. Bladder: Mild circumferential bladder wall thickening of 6 mm. Both ureteral jets are seen. Other: Bilateral pleural effusions. IMPRESSION: 1. Lateral thickening up  to 6 mm. Recommend correlation with urinalysis. 2. Simple right renal cyst. Otherwise unremarkable sonographic appearance of the kidneys. 3. Bilateral pleural effusions. Electronically Signed   By: Keith Rake M.D.   On: 07/26/2020 18:06   ECHOCARDIOGRAM COMPLETE  Result Date: 07/25/2020    ECHOCARDIOGRAM REPORT   Patient Name:   Jeffrey Young Date of Exam: 07/25/2020 Medical Rec #:  [id number]      Height:       80.0 in Accession #:    [id number]     Weight:       227.1 lb Date of Birth:  1965-11-18      BSA:          2.427 m Patient Age:    55 years       BP:           156/93 mmHg Patient Gender: M              HR:           83 bpm. Exam Location:  Inpatient Procedure: 2D Echo, Cardiac Doppler and Color Doppler Indications:    I50.33 Acute on chronic diastolic (congestive) heart failure  History:        Patient has no prior history of Echocardiogram examinations.                 Risk Factors:Diabetes.  Sonographer:    Jonelle Sidle Dance Referring Phys: [place]  1. Left ventricular ejection  fraction, by estimation, is 55 to 60%. The left ventricle has normal function. The left ventricle has no regional wall motion abnormalities. There is mild left ventricular hypertrophy. Left ventricular diastolic parameters are indeterminate.  2. Right ventricular systolic function is normal. The right ventricular size is normal. Tricuspid regurgitation signal is inadequate for assessing PA pressure.  3. There is a trivial pericardial effusion anterior to the right ventricle.  4. The mitral valve is grossly normal. Mild mitral valve regurgitation.  5. The aortic valve is tricuspid. Aortic valve regurgitation is not visualized.  6. The inferior vena cava is dilated in size with >50% respiratory variability, suggesting right atrial pressure of 8 mmHg. FINDINGS  Left Ventricle: Left ventricular ejection fraction, by estimation, is 55 to 60%. The left ventricle has normal function. The left ventricle has no  regional wall motion abnormalities. The left ventricular internal cavity size was normal in size. There is  mild left ventricular hypertrophy. Left ventricular diastolic parameters are indeterminate. Right Ventricle: The right ventricular size is normal. No increase in right ventricular wall thickness. Right ventricular systolic function is normal. Tricuspid regurgitation signal is inadequate for assessing PA pressure. Left Atrium: Left atrial size was normal in size. Right Atrium: Right atrial size was normal in size. Pericardium: Trivial pericardial effusion is present. The pericardial effusion is anterior to the right ventricle. Mitral Valve: The mitral valve is grossly normal. Mild mitral valve regurgitation. Tricuspid Valve: The tricuspid valve is grossly normal. Tricuspid valve regurgitation is trivial. Aortic Valve: The aortic valve is tricuspid. Aortic valve regurgitation is not visualized. Pulmonic Valve: The pulmonic valve was grossly normal. Pulmonic valve regurgitation is trivial. Aorta: The aortic root is normal in size and structure. Venous: The inferior vena cava is dilated in size with greater than 50% respiratory variability, suggesting right atrial pressure of 8 mmHg. IAS/Shunts: The interatrial septum appears to be lipomatous. No atrial level shunt detected by color flow Doppler.  LEFT VENTRICLE PLAX 2D LVIDd:         3.90 cm  Diastology LVIDs:         2.80 cm  LV e' medial:    7.29 cm/s LV PW:         1.30 cm  LV E/e' medial:  18.1 LV IVS:        1.10 cm  LV e' lateral:   8.38 cm/s LVOT diam:     2.20 cm  LV E/e' lateral: 15.8 LV SV:         89 LV SV Index:   37 LVOT Area:     3.80 cm  RIGHT VENTRICLE             IVC RV Basal diam:  2.50 cm     IVC diam: 2.50 cm RV S prime:     13.70 cm/s TAPSE (M-mode): 2.6 cm LEFT ATRIUM             Index       RIGHT ATRIUM           Index LA diam:        4.40 cm 1.81 cm/m  RA Area:     13.50 cm LA Vol (A2C):   75.5 ml 31.11 ml/m RA Volume:   25.70 ml   10.59 ml/m LA Vol (A4C):   34.5 ml 14.21 ml/m LA Biplane Vol: 55.9 ml 23.03 ml/m  AORTIC VALVE LVOT Vmax:   126.00 cm/s LVOT Vmean:  80.700 cm/s LVOT VTI:    0.235 m  AORTA Ao Root diam: 3.50 cm Ao Asc diam:  3.30 cm MITRAL VALVE MV Area (PHT): 3.21 cm     SHUNTS MV Decel Time: 236 msec     Systemic VTI:  0.24 m MV E velocity: 132.00 cm/s  Systemic Diam: 2.20 cm MV A velocity: 114.00 cm/s MV E/A ratio:  1.16 Rozann Lesches MD Electronically signed by Rozann Lesches MD Signature Date/Time: 07/25/2020/11:07:06 AM    Final    [place] EKG SITE RITE  Result Date: 07/28/2020 If Site Rite image not attached, placement could not be confirmed due to current cardiac rhythm.    Subjective: No acute issues or events overnight   Discharge Exam: Vitals:   08/03/20 0522 08/03/20 0956  BP: (!) 159/78 (!) 156/87  Pulse: 82 84  Resp: 18   Temp: 99.1 F (37.3 C)   SpO2: 94%    Vitals:   08/02/20 1327 08/02/20 2005 08/03/20 0522 08/03/20 0956  BP: 139/72 (!) 184/96 (!) 159/78 (!) 156/87  Pulse: 84 87 82 84  Resp: 18 18 18    Temp: 98.7 F (37.1 C) 99.1 F (37.3 C) 99.1 F (37.3 C)   TempSrc: Oral Oral Oral   SpO2: 94% 99% 94%   Weight:      Height:        General: Pt is alert, awake, not in acute distress Cardiovascular: RRR, S1/S2 +, no rubs, no gallops Respiratory: CTA bilaterally, no wheezing, no rhonchi Abdominal: Soft, NT, ND, bowel sounds + Extremities: no edema, no cyanosis    The results of significant diagnostics from this hospitalization (including imaging, microbiology, ancillary and laboratory) are listed below for reference.     Microbiology: Recent Results (from the past 240 hour(s))  Culture, Urine     Status: None   Collection Time: 07/25/20  9:31 AM   Specimen: Urine, Clean Catch  Result Value Ref Range Status   Specimen Description   Final    URINE, CLEAN CATCH Performed at [facility], [place] [address], [place], [place] [address]    Special  Requests   Final    URINE, RANDOM Performed at [place] [address] [place], [place] [address]    Culture   Final    NO GROWTH Performed at [facility], [place] [address] [place], [place] [address]    Report Status 07/26/2020 FINAL  Final     Labs: BNP (last 3 results) Recent Labs    07/24/20 2125  BNP 786.7*   Basic Metabolic Panel: Recent Labs  Lab 07/31/20 0516 07/31/20 1221 08/01/20 0536 08/02/20 0455 08/03/20 1042  NA 134* 137 134* 136 136  K 4.6 4.3 4.2 4.1 3.7  CL 104 105 104 104 104  CO2 23 25 20* 23 23  GLUCOSE 220* 164* 122* 149* 159*  BUN 27* 25* 24* 23* 23*  CREATININE 2.41* 2.53* 2.55* 2.30* 2.13*  CALCIUM 8.1* 8.5* 8.1* 8.7* 8.5*   Liver Function Tests: No results for input(s): AST, ALT, ALKPHOS, BILITOT, PROT, ALBUMIN in the last 168 hours. No results for input(s): LIPASE, AMYLASE in the last 168 hours. No results for input(s): AMMONIA in the last 168 hours. CBC: Recent Labs  Lab 07/31/20 0516 07/31/20 1221 08/01/20 0536 08/02/20 0455 08/03/20 1042  WBC 10.1 10.5 9.5 8.8 7.5  HGB 7.8* 8.1* 7.7* 8.1* 8.1*  HCT 24.8* 26.1* 24.9* 25.9* 26.0*  MCV 86.1 86.4 87.4 85.5 87.2  PLT 235 267 240 286 315   Cardiac Enzymes: No  results for input(s): CKTOTAL, CKMB, CKMBINDEX, TROPONINI in the last 168 hours. BNP: Invalid input(s): POCBNP CBG: Recent Labs  Lab 08/02/20 2002 08/02/20 2354 08/03/20 0418 08/03/20 0649 08/03/20 0758  GLUCAP 143* 167* 139* 131* 129*   D-Dimer No results for input(s): DDIMER in the last 72 hours. Hgb A1c No results for input(s): HGBA1C in the last 72 hours. Lipid Profile No results for input(s): CHOL, HDL, LDLCALC, TRIG, CHOLHDL, LDLDIRECT in the last 72 hours. Thyroid function studies No results for input(s): TSH, T4TOTAL, T3FREE, THYROIDAB in the last 72 hours.  Invalid input(s): FREET3 Anemia work up Recent Labs    08/01/20 0536  VITAMINB12 411  FOLATE 6.0  FERRITIN 42   TIBC 322  IRON 9*  RETICCTPCT 1.8   Urinalysis    Component Value Date/Time   COLORURINE STRAW (A) 07/25/2020 0931   APPEARANCEUR CLEAR 07/25/2020 0931   LABSPEC 1.009 07/25/2020 0931   PHURINE 6.0 07/25/2020 0931   GLUCOSEU NEGATIVE 07/25/2020 0931   HGBUR MODERATE (A) 07/25/2020 0931   BILIRUBINUR NEGATIVE 07/25/2020 0931   KETONESUR NEGATIVE 07/25/2020 0931   PROTEINUR 100 (A) 07/25/2020 0931   NITRITE NEGATIVE 07/25/2020 0931   LEUKOCYTESUR NEGATIVE 07/25/2020 0931   Sepsis Labs Invalid input(s): PROCALCITONIN,  WBC,  LACTICIDVEN Microbiology Recent Results (from the past 240 hour(s))  Culture, Urine     Status: None   Collection Time: 07/25/20  9:31 AM   Specimen: Urine, Clean Catch  Result Value Ref Range Status   Specimen Description   Final    URINE, CLEAN CATCH Performed at [facility], [place] [address] [place], [place] [address]    Special Requests   Final    URINE, RANDOM Performed at [place] [address], [place], [place] [address]    Culture   Final    NO GROWTH Performed at [facility], [place] [address], [place], [place] [address]    Report Status 07/26/2020 FINAL  Final     Time coordinating discharge: Over 30 minutes  SIGNED:   Little Ishikawa, DO [facility] 08/03/2020, 11:24 AM Pager   If 7PM-7AM, please contact night-coverage [email or web address]

## 2020-08-03 NOTE — Plan of Care (Signed)

## 2020-08-03 NOTE — Progress Notes (Signed)
Progress Note  Patient Name: Jeffrey Young Date of Encounter: 08/03/2020  [facility] Cardiologist: Skeet Latch, MD   Subjective   Feeling well. No chest pain, sob or palpitations.  Pending CBC and BMP today.   Inpatient Medications    Scheduled Meds: . acetaminophen  1,000 mg Oral Q6H  . amLODipine  5 mg Oral Daily  . feeding supplement  237 mL Oral BID BM  . heparin injection (subcutaneous)  5,000 Units Subcutaneous Q8H  . insulin aspart  0-15 Units Subcutaneous Q4H  . lip balm  1 application Topical BID  . pantoprazole  40 mg Oral BID  . polycarbophil  625 mg Oral BID  . sodium chloride flush  10-40 mL Intracatheter Q12H   Continuous Infusions: . chlorproMAZINE (THORAZINE) IV    . lactated ringers Stopped (08/01/20 1036)   PRN Meds: albuterol, alum & mag hydroxide-simeth, bisacodyl, calcium carbonate, chlorproMAZINE (THORAZINE) IV, diphenhydrAMINE, HYDROmorphone (DILAUDID) injection, magic mouthwash, menthol-cetylpyridinium, methocarbamol, metoprolol tartrate, ondansetron **OR** ondansetron (ZOFRAN) IV, phenol, simethicone, sodium chloride flush   Vital Signs    Vitals:   08/02/20 0323 08/02/20 1327 08/02/20 2005 08/03/20 0522  BP: (!) 156/91 139/72 (!) 184/96 (!) 159/78  Pulse: 93 84 87 82  Resp:  18 18 18   Temp: 98.9 F (37.2 C) 98.7 F (37.1 C) 99.1 F (37.3 C) 99.1 F (37.3 C)  TempSrc: Oral Oral Oral Oral  SpO2: 96% 94% 99% 94%  Weight:      Height:        Intake/Output Summary (Last 24 hours) at 08/03/2020 0909 Last data filed at 08/02/2020 1205 Gross per 24 hour  Intake 120 ml  Output --  Net 120 ml   Last 3 Weights 07/31/2020 07/29/2020 07/28/2020  Weight (lbs) 227 lb 15.3 oz 223 lb 15.8 oz 220 lb 10.9 oz  Weight (kg) 103.4 kg 101.6 kg 100.1 kg      Telemetry    Not on tele currently   ECG    N/A  Physical Exam   GEN: No acute distress.   Neck: No JVD Cardiac: RRR, no murmurs, rubs, or gallops.  Respiratory: Clear to  auscultation bilaterally. GI: Soft, nontender, non-distended  MS: trace RLE edema;  L transmetatarsal amputation  Neuro:  Nonfocal  Psych: Normal affect   Labs    High Sensitivity Troponin:  No results for input(s): TROPONINIHS in the last 720 hours.    Chemistry Recent Labs  Lab 07/31/20 1221 08/01/20 0536 08/02/20 0455  NA 137 134* 136  K 4.3 4.2 4.1  CL 105 104 104  CO2 25 20* 23  GLUCOSE 164* 122* 149*  BUN 25* 24* 23*  CREATININE 2.53* 2.55* 2.30*  CALCIUM 8.5* 8.1* 8.7*  GFRNONAA 29* 29* 32*  ANIONGAP 7 10 9      Hematology Recent Labs  Lab 07/31/20 1221 08/01/20 0536 08/02/20 0455  WBC 10.5 9.5 8.8  RBC 3.02* 2.85*  2.82* 3.03*  HGB 8.1* 7.7* 8.1*  HCT 26.1* 24.9* 25.9*  MCV 86.4 87.4 85.5  MCH 26.8 27.0 26.7  MCHC 31.0 30.9 31.3  RDW 15.1 15.2 15.2  PLT 267 240 286    Radiology    No results found.  Cardiac Studies   Echo 07/25/20: 1. Left ventricular ejection fraction, by estimation, is 55 to 60%. The  left ventricle has normal function. The left ventricle has no regional  wall motion abnormalities. There is mild left ventricular hypertrophy.  Left ventricular diastolic parameters  are indeterminate.  2. Right  ventricular systolic function is normal. The right ventricular  size is normal. Tricuspid regurgitation signal is inadequate for assessing  PA pressure.  3. There is a trivial pericardial effusion anterior to the right  ventricle.  4. The mitral valve is grossly normal. Mild mitral valve regurgitation.  5. The aortic valve is tricuspid. Aortic valve regurgitation is not  visualized.  6. The inferior vena cava is dilated in size with >50% respiratory  variability, suggesting right atrial pressure of 8 mmHg.   Patient Profile     55 y.o. male diabetes and prior L transmetatarsal amputation admitted with shortness of breath. He was found to have acute diastolic heart failure. Echo this admission revealed LVEF 55 to 60% with mild  LVH.   Assessment & Plan    1. Acute diastolic heart failure: 2. Acute on chronic renal failure:  - Jeffrey Young presented volume overloaded and short of breath in the setting of worsening anemia.  Hemoglobin on admission was 6.0.  BNP was 210.  -Echo has revealed normal systolic function.  Right atrial pressure was 8 mmHg.   -He was initially diuresed with improvement in his symptoms.  However furosemide was then held in the setting of worsening renal function.  Last dose was 3/7.   - SCr  Peaked at 2.53 on 3/13>>>improved to 2.3 yesterday>> pending BMP today   3.  Essential hypertension: - Coreg held on 3/12 - BP elevated, last reading 157/78  - Continue Amlodipine 5mg  qd - Restart coreg 3.125mg  BID>> titrate as needed   4. Colonic mass: - S/p resection with primary colonic anastamosis on 3/11   For questions or updates, please contact [place] Please consult [email or web address] for contact info under        SignedLeanor Kail, PA  08/03/2020, 9:09 AM

## 2020-08-03 NOTE — Discharge Instructions (Signed)
[facility], [place] [phone]  OPEN ABDOMINAL SURGERY: POST OP INSTRUCTIONS  Always review your discharge instruction sheet given to you by the facility where your surgery was performed.  IF YOU HAVE DISABILITY OR FAMILY LEAVE FORMS, YOU MUST BRING THEM TO THE OFFICE FOR PROCESSING.  PLEASE DO NOT GIVE THEM TO YOUR DOCTOR.  1. A prescription for pain medication may be given to you upon discharge.  Take your pain medication as prescribed, if needed.  If narcotic pain medicine is not needed, then you may take acetaminophen (Tylenol) or ibuprofen (Advil) as needed. 2. Take your usually prescribed medications unless otherwise directed. 3. If you need a refill on your pain medication, please contact your pharmacy. They will contact our office to request authorization.  Prescriptions will not be filled after 5pm or on week-ends. 4. You should follow a light diet the first few days after arrival home, such as soup and crackers, pudding, etc.unless your doctor has advised otherwise. A high-fiber, low fat diet can be resumed as tolerated.   Be sure to include lots of fluids daily. Most patients will experience some swelling and bruising on the chest and neck area.  Ice packs will help.  Swelling and bruising can take several days to resolve 5. Most patients will experience some swelling and bruising in the area of the incision. Ice pack will help. Swelling and bruising can take several days to resolve..  6. It is common to experience some constipation if taking pain medication after surgery.  Increasing fluid intake and taking a stool softener will usually help or prevent this problem from occurring.  A mild laxative (Milk of Magnesia or Miralax) should be taken according to package directions if there are no bowel movements after 48 hours. 7.  You may have steri-strips (small skin tapes) in place directly over the incision.  These strips should be left on the skin for 7-10 days.  If your  surgeon used skin glue on the incision, you may shower in 24 hours.  The glue will flake off over the next 2-3 weeks.  Any sutures or staples will be removed at the office during your follow-up visit. You may find that a light gauze bandage over your incision may keep your staples from being rubbed or pulled. You may shower and replace the bandage daily. 8. ACTIVITIES:  You may resume regular (light) daily activities beginning the next day--such as daily self-care, walking, climbing stairs--gradually increasing activities as tolerated.  You may have sexual intercourse when it is comfortable.  Refrain from any heavy lifting or straining until approved by your doctor. a. You may drive when you no longer are taking prescription pain medication, you can comfortably wear a seatbelt, and you can safely maneuver your car and apply brakes  9. You should see your doctor in the office for a follow-up appointment approximately two weeks after your surgery.  Make sure that you call for this appointment within a day or two after you arrive home to insure a convenient appointment time.   WHEN TO CALL YOUR DOCTOR: 1. Fever over 101.0 2. Inability to urinate 3. Nausea and/or vomiting 4. Extreme swelling or bruising 5. Continued bleeding from incision. 6. Increased pain, redness, or drainage from the incision. 7. Difficulty swallowing or breathing 8. Muscle cramping or spasms. 9. Numbness or tingling in hands or feet or around lips.  The clinic staff is available to answer your questions during regular business hours.  Please don't hesitate to call and ask to speak to one of the nurses if you have concerns.  For further questions, please visit [email or web address]  How to Prevent Constipation After Surgery Constipation is a common problem after surgery. Many things can make constipation more likely after a surgery, including:  Certain medicines, especially numbing medicines (anesthetics) and very  strong pain medicines called opioids.  Feeling stressed because of the surgery.  Eating different foods than normal.  Being less active. Symptoms of constipation include:  Having fewer than three bowel movements a week.  Straining to have a bowel movement.  Having hard, dry, or larger-than-normal stools (feces).  Discomfort in the lower abdomen, such as cramps or bloating.  Not feeling relief after having a bowel movement.  Nausea and vomiting. You can take steps to help prevent constipation after surgery. Follow these instructions at home: Eating and drinking  Eat foods that have a lot of fiber in them, such as beans, bran, whole grains, and fresh fruits and vegetables.  Limit foods that are high in fat and processed sugars, such as fried or sweet foods. These include french fries, hamburgers, cookies, and candy.  Take a fiber supplement as told by your health care provider. If you are not taking a fiber supplement and you think you are not getting enough fiber from foods, talk to your health care provider about adding a fiber supplement to your diet.  Drink enough fluid to keep your urine pale yellow.  Drink clear fluids, especially water. Avoid drinking alcohol, caffeine, and soda. These can make constipation worse.   Activity  After surgery, return to your normal activities slowly, or when your health care provider says it is okay.  Start walking as soon as you can. Try to go a little farther each day.  Once your health care provider approves, do some sort of regular exercise. This helps prevent constipation.   Bowel movements  Go to the restroom when you have the urge to go. Do not hold it in.  Try drinking something hot to get a bowel movement started.  Keep track of how often you use the restroom. Medicines  Take over-the-counter and prescription medicines only as told by your health care provider.  Talk to your health care provider about medicines that may  help prevent constipation, particularly if you have a history of constipation. Your health care provider may suggest a stool softener, laxative, or fiber supplement.  Do not take any medicines without talking to your health care provider first. Contact a health care provider if:  You used stool softeners or laxatives and still have not had a bowel movement within 24-48 hours after using them.  You have not had a bowel movement in 3 days.  You have a fever. Get help right away if you have:  Constipation that lasts for more than 4 days or if your symptoms get worse.  Bright red blood in your stool.  Pain in the abdomen or rectum.  Very bad cramping.  Thin, pencil-like stools.  Unexplained weight loss. Summary  Constipation is a common problem after surgery. Many things can make constipation more likely after a surgery, including certain medicines, eating different foods than normal, and being less active.  Symptoms of constipation include having fewer than three bowel movements a week, straining to have a bowel movement, and cramps or bloating in the lower abdomen.  To help prevent constipation, you should eat foods that are high in fiber, drink plenty of fluids,  and get regular physical activity.  Your health care provider may suggest medicines, such as stool softeners or laxatives, to help prevent constipation. This information is not intended to replace advice given to you by your health care provider. Make sure you discuss any questions you have with your health care provider. Document Revised: 03/26/2019 Document Reviewed: 03/26/2019 Elsevier Patient Education  [place].

## 2020-08-03 NOTE — Progress Notes (Signed)
Progress Note  4 Days Post-Op  Subjective: Patient tolerated soft diet and reports diarrhea has improved some, although not completely resolved. Had some incontinence of stool with urination yesterday but this is improving as well. Denies significant abdominal pain. Denies nausea or vomiting.   Objective: Vital signs in last 24 hours: Temp:  [98.7 F (37.1 C)-99.1 F (37.3 C)] 99.1 F (37.3 C) (03/15 0522) Pulse Rate:  [82-87] 84 (03/15 0956) Resp:  [18] 18 (03/15 0522) BP: (139-184)/(72-96) 156/87 (03/15 0956) SpO2:  [94 %-99 %] 94 % (03/15 0522) Last BM Date: 08/03/20  Intake/Output from previous day: 03/14 0701 - 03/15 0700 In: 240 [P.O.:240] Out: -  Intake/Output this shift: No intake/output data recorded.  PE: General: pleasant, WD, obese male who is laying in bed in NAD Heart: regular, rate, and rhythm.   Lungs: CTAB, no wheezes, rhonchi, or rales noted.  Respiratory effort nonlabored Abd: soft, NT, mildly distended, +BS, incisions c/d/i with staples present  MS: all 4 extremities are symmetrical with no cyanosis, clubbing, or edema. Skin: warm and dry with no masses, lesions, or rashes Neuro: Cranial nerves 2-12 grossly intact, sensation is normal throughout Psych: A&Ox3 with an appropriate affect.   Lab Results:  Recent Labs    08/01/20 0536 08/02/20 0455  WBC 9.5 8.8  HGB 7.7* 8.1*  HCT 24.9* 25.9*  PLT 240 286   BMET Recent Labs    08/01/20 0536 08/02/20 0455  NA 134* 136  K 4.2 4.1  CL 104 104  CO2 20* 23  GLUCOSE 122* 149*  BUN 24* 23*  CREATININE 2.55* 2.30*  CALCIUM 8.1* 8.7*   PT/INR No results for input(s): LABPROT, INR in the last 72 hours. CMP     Component Value Date/Time   NA 136 08/02/2020 0455   K 4.1 08/02/2020 0455   CL 104 08/02/2020 0455   CO2 23 08/02/2020 0455   GLUCOSE 149 (H) 08/02/2020 0455   BUN 23 (H) 08/02/2020 0455   CREATININE 2.30 (H) 08/02/2020 0455   CALCIUM 8.7 (L) 08/02/2020 0455   PROT 6.8  07/24/2020 2125   ALBUMIN 3.3 (L) 07/24/2020 2125   AST 26 07/24/2020 2125   ALT 19 07/24/2020 2125   ALKPHOS 118 07/24/2020 2125   BILITOT 0.8 07/24/2020 2125   GFRNONAA 32 (L) 08/02/2020 0455   GFRAA >60 01/26/2018 1339   Lipase  No results found for: LIPASE     Studies/Results: No results found.  Anti-infectives: Anti-infectives (From admission, onward)   Start     Dose/Rate Route Frequency Ordered Stop   07/31/20 1000  cefoTEtan (CEFOTAN) 2 g in sodium chloride 0.9 % 100 mL IVPB        2 g 200 mL/hr over 30 Minutes Intravenous Every 12 hours 07/31/20 0548 07/31/20 1939   07/30/20 0600  cefoTEtan (CEFOTAN) 2 g in sodium chloride 0.9 % 100 mL IVPB        2 g 200 mL/hr over 30 Minutes Intravenous On call to O.R. 07/29/20 1004 07/30/20 1552   07/29/20 1400  neomycin (MYCIFRADIN) tablet 1,000 mg       "And" Linked Group Details   1,000 mg Oral 3 times per day 07/29/20 1004 07/29/20 2134   07/29/20 1400  metroNIDAZOLE (FLAGYL) tablet 1,000 mg       "And" Linked Group Details   1,000 mg Oral 3 times per day 07/29/20 1004 07/29/20 2134       Assessment/Plan Acute DCHF Type II diabetes Left transmetatarsal amputation  CKD - Above per [facility] -   Adenocarcinoma of the proximal descending colon S/p laparoscopic assisted L hemicolectomy with primary anastomosis 07/30/20 [name] Done  - CEA 17.3  - Colonoscopy w/ bx 3/8: Adenocarcinoma, moderately differentiated  - no hepatic lesions noted on CT abd/pelvis, CT chest with three small pulmonary nodules.  - POD#4 - surgical path pending  - patient tolerated soft diet and having bowel function - ok to adv to reg diet today - stable for discharge from a surgical standpoint, follow up in AVS and letter providing restrictions for work provided. Patient is not taking any narcotic pain medication so no Rx sent.  FEN: reg diet  ID: cefotetan pre-op DVT: SCD's, SQH  LOS: 10 days    Norm Parcel , [facility]  Surgery 08/03/2020, 10:31 AM Please see Amion for pager number during day hours 7:00am-4:30pm

## 2020-08-04 ENCOUNTER — Telehealth: Payer: Self-pay

## 2020-08-04 NOTE — Progress Notes (Signed)
Left voice message on patient's home phone regarding hospital f/u with Dr. Julieanne Manson.  I have scheduled the patient for next Wednesday 08/11/2020 at 10:00 to arrive at 9:45 .  I requested he call me back on my direct line to confirm this appointment.

## 2020-08-04 NOTE — Telephone Encounter (Signed)
[name] Velta Addison called back to confirm appointment for 3/23 at 76.  I asked them to arrive about 20 minutes prior for registration purposes.  She is aware of our location and that valet services are available.

## 2020-08-11 ENCOUNTER — Telehealth: Payer: Self-pay | Admitting: Oncology

## 2020-08-11 ENCOUNTER — Ambulatory Visit (HOSPITAL_COMMUNITY)
Admission: RE | Admit: 2020-08-11 | Discharge: 2020-08-11 | Disposition: A | Discharge status: Home / Self Care | Payer: 59 | Source: Ambulatory Visit | Attending: Oncology | Admitting: Oncology

## 2020-08-11 ENCOUNTER — Inpatient Hospital Stay: Payer: 59 | Attending: Oncology | Admitting: Oncology

## 2020-08-11 ENCOUNTER — Other Ambulatory Visit: Payer: Self-pay

## 2020-08-11 VITALS — BP 151/71 | HR 83 | Temp 98.1°F | Resp 18 | Ht 73.0 in | Wt 219.0 lb

## 2020-08-11 DIAGNOSIS — C185 Malignant neoplasm of splenic flexure: Secondary | ICD-10-CM | POA: Insufficient documentation

## 2020-08-11 DIAGNOSIS — I10 Essential (primary) hypertension: Secondary | ICD-10-CM | POA: Diagnosis not present

## 2020-08-11 DIAGNOSIS — C186 Malignant neoplasm of descending colon: Secondary | ICD-10-CM | POA: Insufficient documentation

## 2020-08-11 DIAGNOSIS — Z9221 Personal history of antineoplastic chemotherapy: Secondary | ICD-10-CM | POA: Insufficient documentation

## 2020-08-11 DIAGNOSIS — Z79899 Other long term (current) drug therapy: Secondary | ICD-10-CM | POA: Insufficient documentation

## 2020-08-11 DIAGNOSIS — E119 Type 2 diabetes mellitus without complications: Secondary | ICD-10-CM | POA: Diagnosis not present

## 2020-08-11 NOTE — Progress Notes (Signed)
START ON PATHWAY REGIMEN - Colorectal     A cycle is every 14 days:     Oxaliplatin      Leucovorin      Fluorouracil      Fluorouracil   **Always confirm dose/schedule in your pharmacy ordering system**  Patient Characteristics: Postoperative without Neoadjuvant Therapy (Pathologic Staging), Colon, Stage III, High Risk (pT4 or pN2) Tumor Location: Colon Therapeutic Status: Postoperative without Neoadjuvant Therapy (Pathologic Staging) AJCC M Category: cM0 AJCC T Category: pT3 AJCC N Category: pN2b AJCC 8 Stage Grouping: IIIC Intent of Therapy: Curative Intent, Discussed with Patient

## 2020-08-11 NOTE — Telephone Encounter (Signed)
Scheduled appt per 3/23 los - gave patient Avs and calender

## 2020-08-11 NOTE — Progress Notes (Signed)
Lower extremity venous has been completed.   Preliminary results in CV Proc.   Abram Sander 08/11/2020 2:01 PM

## 2020-08-11 NOTE — Progress Notes (Signed)
Jeffrey Young   Diagnosis: Colon cancer  INTERVAL HISTORY:   Jeffrey Young underwent a laparoscopic assisted transverse colectomy on 07/30/2020.  The surgery was difficult secondary to adhesions.  He was discharged in the hospital 08/03/2020.  He reports feeling well.  No complaint today.  He is here today with his wife.  Objective:  Vital signs in last 24 hours:  Blood pressure (!) 151/71, pulse 83, temperature 98.1 F (36.7 C), temperature source Tympanic, resp. rate 18, height $RemoveBe'6\' 1"'XxTSSfHGI$  (1.854 m), weight 219 lb (99.3 kg), SpO2 100 %.    Lymphatics: No cervical, supraclavicular, axillary, or inguinal nodes Resp: Lungs clear bilaterally Cardio: Regular rate and rhythm GI: No hepatosplenomegaly, healing midline incision with staples in place Vascular: Trace edema throughout the right lower leg, no erythema or tenderness     Lab Results:  Lab Results  Component Value Date   WBC 7.5 08/03/2020   HGB 8.1 (L) 08/03/2020   HCT 26.0 (L) 08/03/2020   MCV 87.2 08/03/2020   PLT 315 08/03/2020   NEUTROABS 4.7 07/27/2020    CMP  Lab Results  Component Value Date   NA 136 08/03/2020   K 3.7 08/03/2020   CL 104 08/03/2020   CO2 23 08/03/2020   GLUCOSE 159 (H) 08/03/2020   BUN 23 (H) 08/03/2020   CREATININE 2.13 (H) 08/03/2020   CALCIUM 8.5 (L) 08/03/2020   PROT 6.8 07/24/2020   ALBUMIN 3.3 (L) 07/24/2020   AST 26 07/24/2020   ALT 19 07/24/2020   ALKPHOS 118 07/24/2020   BILITOT 0.8 07/24/2020   GFRNONAA 35 (L) 08/03/2020   GFRAA >60 01/26/2018    Lab Results  Component Value Date   CEA1 17.3 (H) 07/27/2020     Medications: I have reviewed the patient's current medications.   Assessment/Plan: 1.  Descending colon cancer, stage IIIc (T3N2b M0), status post a partial left colectomy 07/30/2020, 9/16 lymph nodes positive, lymphovascular invasion, 1 satellite nodule, negative margins, MSS, no loss of mismatch repair protein  expression -History of large polyp in the left side of the colon-referred to [facility] in 05/2018 for procedure canceled secondary to COVID-19 pandemic.  Procedure was not rescheduled. -CT chest/abdomen/pelvis with contrast 07/27/2020-3 small pulmonary nodules less than 5 mm favored to be benign, circumferential luminal narrowing of the distal transverse colon concerning for malignancy, no metastatic adenopathy in the mesentery porta hepatis, no for metastasis. -CEA on 07/27/2020 was 17.3 -Colonoscopy performed 07/27/2020 showed a fungating, infiltrative and ulcerated nonobstructing large mass in the proximal descending colon.  Biopsy-adenocarcinoma 2.  Anemia due to GI bleeding, iron deficiency?,  Renal insufficiency? 3.  New onset acute diastolic CHF 4.  Diabetes mellitus 5.  AKI secondary to diuresis 6.  Hypertension 7.  History of left transmetatarsal amputation 8.  History of colon polyps 9.  Neuropathy   Disposition: [name] has been diagnosed with stage III colon cancer.  I discussed the prognosis and reviewed details of the surgical pathology report with Mr. Molyneux and his wife.  We discussed the expected benefit of adjuvant chemotherapy in patients with resected stage III colon cancer.  He has advanced stage III disease.  He has a high chance of developing recurrent colorectal cancer in the absence of adjuvant systemic therapy.  I recommend adjuvant FOLFOX. We reviewed potential toxicities associated with the FOLFOX regimen including the chance for nausea/vomiting, mucositis, diarrhea, alopecia, and hematologic toxicity.  We discussed the rash, sun sensitivity, hyperpigmentation, and hand/foot syndrome seen with 5-fluorouracil.  We reviewed the allergic reaction and various types of neuropathy associated with oxaliplatin.  He agrees to proceed.  Mr. Ronan will be referred to [name] Done for Port-A-Cath placement.  He will attend a chemotherapy teaching class.  He will be scheduled for baseline  lab studies during the week of 08/16/2020.  The plan is to begin adjuvant FOLFOX on 08/30/2020.  Mr. Thede does not appear to have hereditary nonpolyposis colon cancer syndrome, but his family members are at increased risk of developing colorectal cancer and should receive appropriate screening.  A chemotherapy plan was entered today.    Betsy Coder, MD  08/11/2020  10:36 AM

## 2020-08-11 NOTE — Progress Notes (Signed)
Met with patient and his wife Jeffrey Young today at hospital follow up with Dr. Julieanne Manson. He is s/p left him-colectomy on 07/30/2020 by [name] Done.  He reports he is doing well.  I explained my role as nurse navigator and they were given my direct contact information.  They were shown a model of a port a cath and were explained how it is utilized during his treatment. He has a f/u with [name] Done on 3/31.  I have sent his office a message requesting that [name] Done put in a port prior to initiation of his adjuvant chemotherapy planned for 08/30/2020.  I escorted them to scheduling to arrange for chemo education class prior to this.

## 2020-08-16 ENCOUNTER — Other Ambulatory Visit: Payer: Self-pay

## 2020-08-16 ENCOUNTER — Inpatient Hospital Stay: Payer: 59

## 2020-08-16 DIAGNOSIS — C186 Malignant neoplasm of descending colon: Secondary | ICD-10-CM | POA: Diagnosis not present

## 2020-08-16 DIAGNOSIS — C185 Malignant neoplasm of splenic flexure: Secondary | ICD-10-CM

## 2020-08-16 LAB — CMP (CANCER CENTER ONLY)
ALT: 11 U/L (ref 0–44)
AST: 13 U/L — ABNORMAL LOW (ref 15–41)
Albumin: 3.3 g/dL — ABNORMAL LOW (ref 3.5–5.0)
Alkaline Phosphatase: 113 U/L (ref 38–126)
Anion gap: 11 (ref 5–15)
BUN: 29 mg/dL — ABNORMAL HIGH (ref 6–20)
CO2: 24 mmol/L (ref 22–32)
Calcium: 8.8 mg/dL — ABNORMAL LOW (ref 8.9–10.3)
Chloride: 102 mmol/L (ref 98–111)
Creatinine: 2.01 mg/dL — ABNORMAL HIGH (ref 0.61–1.24)
GFR, Estimated: 38 mL/min — ABNORMAL LOW (ref >60)
Glucose, Bld: 231 mg/dL — ABNORMAL HIGH (ref 70–99)
Potassium: 4.3 mmol/L (ref 3.5–5.1)
Sodium: 137 mmol/L (ref 135–145)
Total Bilirubin: 0.5 mg/dL (ref 0.3–1.2)
Total Protein: 7.4 g/dL (ref 6.5–8.1)

## 2020-08-16 LAB — CBC WITH DIFFERENTIAL (CANCER CENTER ONLY)
Abs Immature Granulocytes: 0.02 10*3/uL (ref 0.00–0.07)
Basophils Absolute: 0.1 10*3/uL (ref 0.0–0.1)
Basophils Relative: 1 %
Eosinophils Absolute: 0.2 10*3/uL (ref 0.0–0.5)
Eosinophils Relative: 2 %
HCT: 28.6 % — ABNORMAL LOW (ref 39.0–52.0)
Hemoglobin: 9 g/dL — ABNORMAL LOW (ref 13.0–17.0)
Immature Granulocytes: 0 %
Lymphocytes Relative: 25 %
Lymphs Abs: 1.8 10*3/uL (ref 0.7–4.0)
MCH: 26.7 pg (ref 26.0–34.0)
MCHC: 31.5 g/dL (ref 30.0–36.0)
MCV: 84.9 fL (ref 80.0–100.0)
Monocytes Absolute: 0.7 10*3/uL (ref 0.1–1.0)
Monocytes Relative: 10 %
Neutro Abs: 4.4 10*3/uL (ref 1.7–7.7)
Neutrophils Relative %: 62 %
Platelet Count: 500 10*3/uL — ABNORMAL HIGH (ref 150–400)
RBC: 3.37 MIL/uL — ABNORMAL LOW (ref 4.22–5.81)
RDW: 16.2 % — ABNORMAL HIGH (ref 11.5–15.5)
WBC Count: 7.2 10*3/uL (ref 4.0–10.5)
nRBC: 0 % (ref 0.0–0.2)

## 2020-08-16 LAB — CEA (IN HOUSE-CHCC): CEA (CHCC-In House): 22.71 ng/mL — ABNORMAL HIGH (ref 0.00–5.00)

## 2020-08-17 ENCOUNTER — Other Ambulatory Visit: Payer: Self-pay | Admitting: *Deleted

## 2020-08-17 MED ORDER — PROCHLORPERAZINE MALEATE 10 MG PO TABS: 10.0000 mg | ORAL_TABLET | Freq: Four times a day (QID) | ORAL | 1 refills | Status: DC | PRN

## 2020-08-17 MED ORDER — ONDANSETRON HCL 8 MG PO TABS: 8.0000 mg | ORAL_TABLET | Freq: Three times a day (TID) | ORAL | 1 refills | Status: DC | PRN

## 2020-08-17 MED ORDER — LIDOCAINE-PRILOCAINE 2.5-2.5 % EX CREA: 1.0000 "application " | TOPICAL_CREAM | CUTANEOUS | 2 refills | Status: DC

## 2020-08-17 NOTE — Progress Notes (Signed)
Anti-emetics and EMLA cream scripts sent at request of education nurse.

## 2020-08-18 ENCOUNTER — Telehealth: Payer: Self-pay

## 2020-08-18 NOTE — Telephone Encounter (Signed)
Chart review.

## 2020-08-19 NOTE — Progress Notes (Addendum)
COVID Vaccine Completed: x3 Date COVID Vaccine completed: 08-29-19 09-26-19 Has received booster: 04-28-20 COVID vaccine manufacturer:    Moderna     Date of COVID positive in last 90 days:  N/A  PCP - [name], MD Cardiologist - Skeet Latch, MD (saw while admitted to hospital in March 2022)  Chest x-ray - 07-24-20 Epic EKG - 07-24-20 Epic Stress Test -  ECHO - 07-25-20 Epic Cardiac Cath -  Pacemaker/ICD device last checked: Spinal Cord Stimulator:  Sleep Study - N/A CPAP -   Fasting Blood Sugar - 74 to 300 Checks Blood Sugar - 2 times a day  Blood Thinner Instructions:  N/A Aspirin Instructions: Last Dose:  Activity level:  Can go up a flight of stairs and perform activities of daily living without stopping and without symptoms of chest pain or shortness of breath.  Able to exercise without symptoms   Anesthesia review: CHF, HTN, DM, CKD  Patient denies shortness of breath, fever, cough and chest pain at PAT appointment   Patient verbalized understanding of instructions that were given to them at the PAT appointment. Patient was also instructed that they will need to review over the PAT instructions again at home before surgery.

## 2020-08-19 NOTE — Patient Instructions (Addendum)
DUE TO COVID-19 ONLY ONE VISITOR IS ALLOWED TO COME WITH YOU AND STAY IN THE WAITING ROOM ONLY DURING PRE OP AND PROCEDURE.    **NO VISITORS ARE ALLOWED IN THE SHORT STAY AREA OR RECOVERY ROOM!!**    Your procedure is scheduled on: Tuesday, 08-24-20   Report to [facility] Main  Entrance    Report to admitting at 8:00 AM   Call this number if you have problems the morning of surgery [phone]   Do not eat food :After Midnight.   May have liquids until 7:00 AM day of surgery  CLEAR LIQUID DIET  Foods Allowed                                                                     Foods Excluded  Water, Black Coffee and tea, regular and decaf              liquids that you cannot  Plain Jell-O in any flavor  (No red)                                    see through such as: Fruit ices (not with fruit pulp)                                      milk, soups, orange juice              Iced Popsicles (No red)                                      All solid food                                   Apple juices Sports drinks like Gatorade (No red) Lightly seasoned clear broth or consume(fat free) Sugar, honey syrup   Oral Hygiene is also important to reduce your risk of infection.                                    Remember - BRUSH YOUR TEETH THE MORNING OF SURGERY WITH YOUR REGULAR TOOTHPASTE   Do NOT smoke after Midnight   Take these medicines the morning of surgery with A SIP OF WATER: Amlodipine, Carvedilol  How to Manage Your Diabetes Before and After Surgery  Why is it important to control my blood sugar before and after surgery? . Improving blood sugar levels before and after surgery helps healing and can limit problems. . A way of improving blood sugar control is eating a healthy diet by: o  Eating less sugar and carbohydrates o  Increasing activity/exercise o  Talking with your doctor about reaching your blood sugar goals . High blood sugars (greater than 180 mg/dL) can  raise your risk of infections and slow your recovery, so you will need to focus on controlling your diabetes during the weeks  before surgery. . Make sure that the doctor who takes care of your diabetes knows about your planned surgery including the date and location.  How do I manage my blood sugar before surgery? . Check your blood sugar at least 4 times a day, starting 2 days before surgery, to make sure that the level is not too high or low. o Check your blood sugar the morning of your surgery when you wake up and every 2 hours until you get to the Short Stay unit. . If your blood sugar is less than 70 mg/dL, you will need to treat for low blood sugar: o Do not take insulin. o Treat a low blood sugar (less than 70 mg/dL) with  cup of clear juice (cranberry or apple), 4 glucose tablets, OR glucose gel. o Recheck blood sugar in 15 minutes after treatment (to make sure it is greater than 70 mg/dL). If your blood sugar is not greater than 70 mg/dL on recheck, call [phone] for further instructions. . Report your blood sugar to the short stay nurse when you get to Short Stay.  . If you are admitted to the hospital after surgery: o Your blood sugar will be checked by the staff and you will probably be given insulin after surgery (instead of oral diabetes medicines) to make sure you have good blood sugar levels. o The goal for blood sugar control after surgery is 80-180 mg/dL.   WHAT DO I DO ABOUT MY DIABETES MEDICATION?  Marland Kitchen Do not take oral diabetes medicines (pills) the morning of surgery.  . THE NIGHT BEFORE SURGERY:  Take  7 units of  Insulin Glargine .       Marland Kitchen THE MORNING OF SURGERY:  Take 5 units of Insulin Glargine.  Reviewed and Endorsed by [facility] Patient Education Committee, August 2015                               You may not have any metal on your body including jewelry, and body piercings             Do not wear lotions, powders, cologne, or deodorant             Men  may shave face and neck.   Do not bring valuables to the hospital. [place].   Contacts, dentures or bridgework may not be worn into surgery.   Bring small overnight bag day of surgery.    Patients discharged the day of surgery will not be allowed to drive home.                Please read over the following fact sheets you were given: IF YOU HAVE QUESTIONS ABOUT YOUR PRE OP INSTRUCTIONS PLEASE CALL  [facility] - Preparing for Surgery Before surgery, you can play an important role.  Because skin is not sterile, your skin needs to be as free of germs as possible.  You can reduce the number of germs on your skin by washing with CHG (chlorahexidine gluconate) soap before surgery.  CHG is an antiseptic cleaner which kills germs and bonds with the skin to continue killing germs even after washing. Please DO NOT use if you have an allergy to CHG or antibacterial soaps.  If your skin becomes reddened/irritated stop using the CHG  and inform your nurse when you arrive at Short Stay. Do not shave (including legs and underarms) for at least 48 hours prior to the first CHG shower.  You may shave your face/neck.  Please follow these instructions carefully:  1.  Shower with CHG Soap the night before surgery and the  morning of surgery.  2.  If you choose to wash your hair, wash your hair first as usual with your normal  shampoo.  3.  After you shampoo, rinse your hair and body thoroughly to remove the shampoo.                             4.  Use CHG as you would any other liquid soap.  You can apply chg directly to the skin and wash.  Gently with a scrungie or clean washcloth.  5.  Apply the CHG Soap to your body ONLY FROM THE NECK DOWN.   Do   not use on face/ open                           Wound or open sores. Avoid contact with eyes, ears mouth and   genitals (private parts).                       Wash face,  Genitals (private  parts) with your normal soap.             6.  Wash thoroughly, paying special attention to the area where your    surgery  will be performed.  7.  Thoroughly rinse your body with warm water from the neck down.  8.  DO NOT shower/wash with your normal soap after using and rinsing off the CHG Soap.                9.  Pat yourself dry with a clean towel.            10.  Wear clean pajamas.            11.  Place clean sheets on your bed the night of your first shower and do not  sleep with pets. Day of Surgery : Do not apply any lotions/deodorants the morning of surgery.  Please wear clean clothes to the hospital/surgery center.  FAILURE TO FOLLOW THESE INSTRUCTIONS MAY RESULT IN THE CANCELLATION OF YOUR SURGERY  PATIENT SIGNATURE_________________________________  NURSE SIGNATURE__________________________________  ________________________________________________________________________

## 2020-08-20 ENCOUNTER — Other Ambulatory Visit (HOSPITAL_COMMUNITY)
Admission: RE | Admit: 2020-08-20 | Discharge: 2020-08-20 | Disposition: A | Discharge status: Home / Self Care | Payer: 59 | Source: Ambulatory Visit | Attending: Surgery | Admitting: Surgery

## 2020-08-20 DIAGNOSIS — Z01812 Encounter for preprocedural laboratory examination: Secondary | ICD-10-CM | POA: Diagnosis not present

## 2020-08-20 DIAGNOSIS — Z20822 Contact with and (suspected) exposure to covid-19: Secondary | ICD-10-CM | POA: Insufficient documentation

## 2020-08-20 LAB — SARS CORONAVIRUS 2 (TAT 6-24 HRS): SARS Coronavirus 2: NEGATIVE

## 2020-08-23 ENCOUNTER — Encounter (HOSPITAL_COMMUNITY)
Admission: RE | Admit: 2020-08-23 | Discharge: 2020-08-23 | Disposition: A | Discharge status: Home / Self Care | Payer: 59 | Source: Ambulatory Visit | Attending: Surgery | Admitting: Surgery

## 2020-08-23 ENCOUNTER — Other Ambulatory Visit: Payer: Self-pay

## 2020-08-23 ENCOUNTER — Encounter (HOSPITAL_COMMUNITY): Payer: Self-pay

## 2020-08-23 ENCOUNTER — Other Ambulatory Visit: Payer: Self-pay | Admitting: *Deleted

## 2020-08-23 DIAGNOSIS — Z794 Long term (current) use of insulin: Secondary | ICD-10-CM | POA: Diagnosis not present

## 2020-08-23 DIAGNOSIS — Z01812 Encounter for preprocedural laboratory examination: Secondary | ICD-10-CM | POA: Diagnosis not present

## 2020-08-23 DIAGNOSIS — E118 Type 2 diabetes mellitus with unspecified complications: Secondary | ICD-10-CM | POA: Insufficient documentation

## 2020-08-23 DIAGNOSIS — Z7901 Long term (current) use of anticoagulants: Secondary | ICD-10-CM | POA: Diagnosis not present

## 2020-08-23 DIAGNOSIS — I110 Hypertensive heart disease with heart failure: Secondary | ICD-10-CM | POA: Insufficient documentation

## 2020-08-23 DIAGNOSIS — C185 Malignant neoplasm of splenic flexure: Secondary | ICD-10-CM

## 2020-08-23 DIAGNOSIS — C189 Malignant neoplasm of colon, unspecified: Secondary | ICD-10-CM | POA: Diagnosis not present

## 2020-08-23 DIAGNOSIS — Z79899 Other long term (current) drug therapy: Secondary | ICD-10-CM | POA: Insufficient documentation

## 2020-08-23 DIAGNOSIS — I509 Heart failure, unspecified: Secondary | ICD-10-CM | POA: Insufficient documentation

## 2020-08-23 HISTORY — DX: Unspecified osteoarthritis, unspecified site: M19.90

## 2020-08-23 HISTORY — DX: Malignant neoplasm of colon, unspecified: C18.9

## 2020-08-23 LAB — SURGICAL PCR SCREEN
MRSA, PCR: POSITIVE — AB
Staphylococcus aureus: POSITIVE — AB

## 2020-08-23 LAB — GLUCOSE, CAPILLARY: Glucose-Capillary: 154 mg/dL — ABNORMAL HIGH (ref 70–99)

## 2020-08-23 NOTE — Progress Notes (Signed)
PCR results sent to [name] Done to review.

## 2020-08-23 NOTE — Anesthesia Preprocedure Evaluation (Addendum)
Anesthesia Evaluation  Patient identified by MRN, date of birth, ID band Patient awake    Reviewed: Allergy & Precautions, NPO status , Patient's Chart, lab work & pertinent test results, reviewed documented beta blocker date and time   History of Anesthesia Complications Negative for: history of anesthetic complications  Airway Mallampati: II  TM Distance: >3 FB Neck ROM: Full    Dental  (+) Missing, Loose,  Extremely loose front tooth:   Pulmonary neg pulmonary ROS,    Pulmonary exam normal        Cardiovascular hypertension, Pt. on medications and Pt. on home beta blockers +CHF  Normal cardiovascular exam     Neuro/Psych negative neurological ROS     GI/Hepatic Neg liver ROS, GERD  ,Colon cancer   Endo/Other  diabetes, Type 2, Insulin Dependent  Renal/GU Renal Insufficiency and CRFRenal disease (Cr 2.01)  negative genitourinary   Musculoskeletal negative musculoskeletal ROS (+)   Abdominal   Peds  Hematology  (+) anemia ,   Anesthesia Other Findings   Reproductive/Obstetrics                          Anesthesia Physical Anesthesia Plan  ASA: III  Anesthesia Plan: General   Post-op Pain Management:    Induction: Intravenous  PONV Risk Score and Plan: 2 and Ondansetron, Dexamethasone, Midazolam and Treatment may vary due to age or medical condition  Airway Management Planned: LMA  Additional Equipment: None  Intra-op Plan:   Post-operative Plan: Extubation in OR  Informed Consent: I have reviewed the patients History and Physical, chart, labs and discussed the procedure including the risks, benefits and alternatives for the proposed anesthesia with the patient or authorized representative who has indicated his/her understanding and acceptance.     Dental advisory given  Plan Discussed with:   Anesthesia Plan Comments: (See PAT note 08/23/2020, Konrad Felix, PA-C)        Anesthesia Quick Evaluation

## 2020-08-23 NOTE — Progress Notes (Signed)
Anesthesia Chart Review   Case: [id number] Date/Time: 08/24/20 0945   Procedure: INSERTION PORT-A-CATH (N/A ) - 75/rm1   Anesthesia type: General   Pre-op diagnosis: CANCER OF COLON FOR CHEMOTHERAPY   Location[facility] RO[facility] 02 / [facility]   Surgeons: Johnathan Hausen, MD      DISCUSSION:55 y.o. never smoker with h/o HTN, DM II, CHF, colon cancer scheduled for above procedure 08/24/2020 with Dr. Johnathan Hausen.   S/p left hemi colectomy 07/30/20 with no anesthesia complications noted.   Prior to this procedure he was admitted for new onset CHF exacerbation. Echo with EF 55-60%. He was diuresed with IV Lasix initial improvement. Lasix were then held due to worsening renal function.  Per cardiology note 07/28/2020, "Mr. Ortega has shortness of breath on admission.  This was likely attributable to his volume overload and anemia.  He has not experienced any chest pain and has been fairly active at baseline.  Given his anemia and need for urgent resection of his likely malignant colonic mass, would not recommend ischemia evaluation, or particularly cardiac cath and stent placement, as this would necessitate dual antiplatelet therapy.  His main operative risk is volume shifts and heart failure exacerbations.  Would attempt to minimize fluid boluses and we will assist with managing his hemodynamics/volume perioperatively."  Per cardio discharge note, "Suspect that severe anemia was driving his acute CHF exacerbation.  Continue to hold diuretics for now.  Will restart carvedilol 3.125mg  BID for HTN and continue amlodipine 5mg  daily."  Pt denies shortness of breath or cardiac sx at PAT visit 08/23/2020. Anticipate pt can proceed with planned procedure barring acute status change and after evaluation by anesthesia DOS.   VS: BP (!) 157/93   Pulse 77   Temp 37.1 C (Oral)   Resp 16   Ht 6\' 1"  (1.854 m)   Wt 97.3 kg   SpO2 100%   BMI 28.31 kg/m   PROVIDERS: [name], MD is PCP    LABS: Labs reviewed:  Acceptable for surgery. (all labs ordered are listed, but only abnormal results are displayed)  Labs Reviewed  SURGICAL PCR SCREEN - Abnormal; Notable for the following components:      Result Value   MRSA, PCR POSITIVE (*)    Staphylococcus aureus POSITIVE (*)    All other components within normal limits  GLUCOSE, CAPILLARY - Abnormal; Notable for the following components:   Glucose-Capillary 154 (*)    All other components within normal limits     IMAGES:   EKG: 07/24/2020 Rate 91 bpm  Sinus rhythm RSR' in V1 or V2, probably normal variant Abnormal inferior Q waves  CV: Echo 07/25/2020 IMPRESSIONS    1. Left ventricular ejection fraction, by estimation, is 55 to 60%. The  left ventricle has normal function. The left ventricle has no regional  wall motion abnormalities. There is mild left ventricular hypertrophy.  Left ventricular diastolic parameters  are indeterminate.  2. Right ventricular systolic function is normal. The right ventricular  size is normal. Tricuspid regurgitation signal is inadequate for assessing  PA pressure.  3. There is a trivial pericardial effusion anterior to the right  ventricle.  4. The mitral valve is grossly normal. Mild mitral valve regurgitation.  5. The aortic valve is tricuspid. Aortic valve regurgitation is not  visualized.  6. The inferior vena cava is dilated in size with >50% respiratory  variability, suggesting right atrial pressure of 8 mmHg.  Past Medical History:  Diagnosis Date  . Arthritis  Hip  . Colon cancer ([place])   . Diabetic mononeuropathy associated with type 2 diabetes mellitus ([place]) 03/21/2017  . Diabetic retinopathy ([place]) 07/31/2020  . Enthesopathy of ankle and tarsus 04/02/2009   Formatting of this note might be different from the original. Metatarsalgia  10/1 IMO update  . Erectile dysfunction associated with type 2 diabetes mellitus ([place]) 05/08/2019  . Hyperlipidemia 07/31/2020  . Hypertension  associated with diabetes ([place]) 06/07/2019  . Microalbuminuria due to type 2 diabetes mellitus ([place]) 03/21/2017  . Necrotizing fasciitis of ankle and foot ([place]) 01/22/2018  . Necrotizing soft tissue infection   . Status post transmetatarsal amputation of left foot ([place]) 01/22/2018  . Systolic heart failure ([place]) 07/31/2020  . 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 Type 2 Diabetes Mellitus - Uncomplicated, Uncontrolled    Past Surgical History:  Procedure Laterality Date  . AMPUTATION Left 01/22/2018   Procedure: TRANSMETATARSAL AMPUTATION;  Surgeon: Newt Minion, MD;  Location: [place];  Service: Orthopedics;  Laterality: Left;toes  . BIOPSY  07/27/2020   Procedure: BIOPSY;  Surgeon: Carol Ada, MD;  Location: [facility];  Service: Endoscopy;;  . COLON RESECTION N/A 07/30/2020   Procedure: HAND ASSISTED LAPAROSCOPIC LEFT HEMI COLECTOMY;  Surgeon: Johnathan Hausen, MD;  [facility];  Service: General;  Laterality: N/A;  . COLONOSCOPY WITH PROPOFOL N/A 05/24/2018   Procedure: COLONOSCOPY WITH PROPOFOL;  Surgeon: Carol Ada, MD;  [facility];  Service: Endoscopy;  Laterality: N/A;  . COLONOSCOPY WITH PROPOFOL N/A 07/27/2020   Procedure: COLONOSCOPY WITH PROPOFOL;  Surgeon: Carol Ada, MD;  [facility];  Service: Endoscopy;  Laterality: N/A;  . POLYPECTOMY  05/24/2018   Procedure: POLYPECTOMY;  Surgeon: Carol Ada, MD;  [facility];  Service: Endoscopy;;  . SUBMUCOSAL TATTOO INJECTION  07/27/2020   Procedure: SUBMUCOSAL TATTOO INJECTION;  Surgeon: Carol Ada, MD;  [facility];  Service: Endoscopy;;    MEDICATIONS: . amLODipine (NORVASC) 5 MG tablet  . atorvastatin (LIPITOR) 10 MG tablet  . carvedilol (COREG) 3.125 MG tablet  . feeding supplement  (ENSURE SURGERY) LIQD  . Insulin Glargine-Lixisenatide 100-33 UNT-MCG/ML SOPN  . lidocaine-prilocaine (EMLA) cream  . ondansetron (ZOFRAN) 8 MG tablet  . pantoprazole (PROTONIX) 40 MG tablet  . polycarbophil (FIBERCON) 625 MG tablet  . prochlorperazine (COMPAZINE) 10 MG tablet   No current facility-administered medications for this encounter.    Konrad Felix, PA-C [facility]Surgical Testing [phone]

## 2020-08-24 ENCOUNTER — Encounter (HOSPITAL_COMMUNITY): Payer: Self-pay | Admitting: Surgery

## 2020-08-24 ENCOUNTER — Ambulatory Visit (HOSPITAL_COMMUNITY)
Admission: RE | Admit: 2020-08-24 | Discharge: 2020-08-24 | Disposition: A | Discharge status: Home / Self Care | Payer: 59 | Attending: Surgery | Admitting: Surgery

## 2020-08-24 ENCOUNTER — Encounter (HOSPITAL_COMMUNITY)
Admission: RE | Disposition: A | Discharge status: Home / Self Care | Payer: Self-pay | Source: Home / Self Care | Attending: Surgery

## 2020-08-24 ENCOUNTER — Ambulatory Visit (HOSPITAL_COMMUNITY): Payer: 59

## 2020-08-24 ENCOUNTER — Ambulatory Visit (HOSPITAL_COMMUNITY): Payer: 59 | Admitting: Certified Registered Nurse Anesthetist

## 2020-08-24 ENCOUNTER — Ambulatory Visit (HOSPITAL_COMMUNITY)
Admission: RE | Admit: 2020-08-24 | Discharge: 2020-08-24 | Disposition: A | Discharge status: Home / Self Care | Payer: 59 | Source: Home / Self Care | Attending: Surgery | Admitting: Surgery

## 2020-08-24 DIAGNOSIS — Z89422 Acquired absence of other left toe(s): Secondary | ICD-10-CM | POA: Diagnosis not present

## 2020-08-24 DIAGNOSIS — E113419 Type 2 diabetes mellitus with severe nonproliferative diabetic retinopathy with macular edema, unspecified eye: Secondary | ICD-10-CM | POA: Insufficient documentation

## 2020-08-24 DIAGNOSIS — E1141 Type 2 diabetes mellitus with diabetic mononeuropathy: Secondary | ICD-10-CM | POA: Diagnosis not present

## 2020-08-24 DIAGNOSIS — N183 Chronic kidney disease, stage 3 unspecified: Secondary | ICD-10-CM | POA: Diagnosis not present

## 2020-08-24 DIAGNOSIS — C185 Malignant neoplasm of splenic flexure: Secondary | ICD-10-CM | POA: Insufficient documentation

## 2020-08-24 DIAGNOSIS — E1122 Type 2 diabetes mellitus with diabetic chronic kidney disease: Secondary | ICD-10-CM | POA: Diagnosis not present

## 2020-08-24 DIAGNOSIS — I130 Hypertensive heart and chronic kidney disease with heart failure and stage 1 through stage 4 chronic kidney disease, or unspecified chronic kidney disease: Secondary | ICD-10-CM | POA: Insufficient documentation

## 2020-08-24 DIAGNOSIS — Z794 Long term (current) use of insulin: Secondary | ICD-10-CM | POA: Diagnosis not present

## 2020-08-24 DIAGNOSIS — N179 Acute kidney failure, unspecified: Secondary | ICD-10-CM | POA: Insufficient documentation

## 2020-08-24 DIAGNOSIS — Z8249 Family history of ischemic heart disease and other diseases of the circulatory system: Secondary | ICD-10-CM | POA: Diagnosis not present

## 2020-08-24 DIAGNOSIS — E11319 Type 2 diabetes mellitus with unspecified diabetic retinopathy without macular edema: Secondary | ICD-10-CM | POA: Diagnosis not present

## 2020-08-24 DIAGNOSIS — G589 Mononeuropathy, unspecified: Secondary | ICD-10-CM | POA: Insufficient documentation

## 2020-08-24 DIAGNOSIS — E1169 Type 2 diabetes mellitus with other specified complication: Secondary | ICD-10-CM | POA: Diagnosis not present

## 2020-08-24 DIAGNOSIS — Z91040 Latex allergy status: Secondary | ICD-10-CM | POA: Diagnosis not present

## 2020-08-24 DIAGNOSIS — Z79899 Other long term (current) drug therapy: Secondary | ICD-10-CM | POA: Diagnosis not present

## 2020-08-24 DIAGNOSIS — E785 Hyperlipidemia, unspecified: Secondary | ICD-10-CM | POA: Insufficient documentation

## 2020-08-24 DIAGNOSIS — N529 Male erectile dysfunction, unspecified: Secondary | ICD-10-CM | POA: Diagnosis not present

## 2020-08-24 HISTORY — PX: PORTACATH PLACEMENT: SHX2246

## 2020-08-24 LAB — GLUCOSE, CAPILLARY
Glucose-Capillary: 163 mg/dL — ABNORMAL HIGH (ref 70–99)
Glucose-Capillary: 134 mg/dL — ABNORMAL HIGH (ref 70–99)

## 2020-08-24 SURGERY — INSERTION, TUNNELED CENTRAL VENOUS DEVICE, WITH PORT
Anesthesia: General | Site: Chest | Laterality: Left

## 2020-08-24 MED ORDER — SODIUM CHLORIDE 0.9 % IV SOLN
Freq: Once | INTRAVENOUS | Status: AC
  Administered 2020-08-24: 20 mL
  Filled 2020-08-24: qty 1.2

## 2020-08-24 MED ORDER — OXYCODONE HCL 5 MG PO TABS
ORAL_TABLET | ORAL | Status: AC
  Filled 2020-08-24: qty 1

## 2020-08-24 MED ORDER — CEFAZOLIN SODIUM-DEXTROSE 2-4 GM/100ML-% IV SOLN
2.0000 g | INTRAVENOUS | Status: AC
  Administered 2020-08-24: 2 g via INTRAVENOUS
  Filled 2020-08-24: qty 100

## 2020-08-24 MED ORDER — HEPARIN SOD (PORK) LOCK FLUSH 100 UNIT/ML IV SOLN
INTRAVENOUS | Status: DC | PRN
  Administered 2020-08-24: 500 [IU]

## 2020-08-24 MED ORDER — FENTANYL CITRATE (PF) 100 MCG/2ML IJ SOLN
INTRAMUSCULAR | Status: DC | PRN
  Administered 2020-08-24 (×2): 50 ug via INTRAVENOUS
  Administered 2020-08-24 (×2): 25 ug via INTRAVENOUS

## 2020-08-24 MED ORDER — HEPARIN SOD (PORK) LOCK FLUSH 100 UNIT/ML IV SOLN
INTRAVENOUS | Status: AC
  Filled 2020-08-24: qty 5

## 2020-08-24 MED ORDER — CARVEDILOL 3.125 MG PO TABS: 3.1250 mg | ORAL_TABLET | Freq: Once | ORAL | Status: DC

## 2020-08-24 MED ORDER — EPHEDRINE 5 MG/ML INJ
INTRAVENOUS | Status: AC
  Filled 2020-08-24: qty 10

## 2020-08-24 MED ORDER — CHLORHEXIDINE GLUCONATE CLOTH 2 % EX PADS: 6.0000 | MEDICATED_PAD | Freq: Once | CUTANEOUS | Status: DC

## 2020-08-24 MED ORDER — DEXAMETHASONE SODIUM PHOSPHATE 10 MG/ML IJ SOLN
INTRAMUSCULAR | Status: DC | PRN
  Administered 2020-08-24: 8 mg via INTRAVENOUS

## 2020-08-24 MED ORDER — FENTANYL CITRATE (PF) 100 MCG/2ML IJ SOLN
INTRAMUSCULAR | Status: AC
  Filled 2020-08-24: qty 2

## 2020-08-24 MED ORDER — OXYCODONE HCL 5 MG/5ML PO SOLN: 5.0000 mg | Freq: Once | ORAL | Status: AC | PRN

## 2020-08-24 MED ORDER — CHLORHEXIDINE GLUCONATE 0.12 % MT SOLN
15.0000 mL | Freq: Once | OROMUCOSAL | Status: AC
  Administered 2020-08-24: 15 mL via OROMUCOSAL

## 2020-08-24 MED ORDER — OXYCODONE HCL 5 MG PO TABS
5.0000 mg | ORAL_TABLET | Freq: Once | ORAL | Status: AC | PRN
  Administered 2020-08-24: 5 mg via ORAL

## 2020-08-24 MED ORDER — MIDAZOLAM HCL 5 MG/5ML IJ SOLN
INTRAMUSCULAR | Status: DC | PRN
  Administered 2020-08-24: 2 mg via INTRAVENOUS

## 2020-08-24 MED ORDER — ONDANSETRON HCL 4 MG/2ML IJ SOLN
INTRAMUSCULAR | Status: AC
  Filled 2020-08-24: qty 2

## 2020-08-24 MED ORDER — EPHEDRINE SULFATE-NACL 50-0.9 MG/10ML-% IV SOSY
PREFILLED_SYRINGE | INTRAVENOUS | Status: DC | PRN
  Administered 2020-08-24 (×2): 15 mg via INTRAVENOUS

## 2020-08-24 MED ORDER — AMISULPRIDE (ANTIEMETIC) 5 MG/2ML IV SOLN: 10.0000 mg | Freq: Once | INTRAVENOUS | Status: DC | PRN

## 2020-08-24 MED ORDER — ORAL CARE MOUTH RINSE: 15.0000 mL | Freq: Once | OROMUCOSAL | Status: AC

## 2020-08-24 MED ORDER — FENTANYL CITRATE (PF) 100 MCG/2ML IJ SOLN: 25.0000 ug | INTRAMUSCULAR | Status: DC | PRN

## 2020-08-24 MED ORDER — ONDANSETRON HCL 4 MG/2ML IJ SOLN: 4.0000 mg | Freq: Once | INTRAMUSCULAR | Status: DC | PRN

## 2020-08-24 MED ORDER — HYDROCODONE-ACETAMINOPHEN 5-325 MG PO TABS: 1.0000 | ORAL_TABLET | Freq: Four times a day (QID) | ORAL | 0 refills | Status: DC | PRN

## 2020-08-24 MED ORDER — LIDOCAINE 2% (20 MG/ML) 5 ML SYRINGE
INTRAMUSCULAR | Status: DC | PRN
  Administered 2020-08-24: 100 mg via INTRAVENOUS

## 2020-08-24 MED ORDER — PROPOFOL 10 MG/ML IV BOLUS
INTRAVENOUS | Status: AC
  Filled 2020-08-24: qty 20

## 2020-08-24 MED ORDER — MIDAZOLAM HCL 2 MG/2ML IJ SOLN
INTRAMUSCULAR | Status: AC
  Filled 2020-08-24: qty 2

## 2020-08-24 MED ORDER — PROPOFOL 10 MG/ML IV BOLUS
INTRAVENOUS | Status: DC | PRN
  Administered 2020-08-24: 150 mg via INTRAVENOUS

## 2020-08-24 MED ORDER — LACTATED RINGERS IV SOLN: INTRAVENOUS | Status: DC

## 2020-08-24 MED ORDER — 0.9 % SODIUM CHLORIDE (POUR BTL) OPTIME
TOPICAL | Status: DC | PRN
  Administered 2020-08-24: 1000 mL

## 2020-08-24 MED ORDER — ONDANSETRON HCL 4 MG/2ML IJ SOLN
INTRAMUSCULAR | Status: DC | PRN
  Administered 2020-08-24: 4 mg via INTRAVENOUS

## 2020-08-24 MED ORDER — DEXAMETHASONE SODIUM PHOSPHATE 10 MG/ML IJ SOLN
INTRAMUSCULAR | Status: AC
  Filled 2020-08-24: qty 1

## 2020-08-24 SURGICAL SUPPLY — 32 items
BAG DECANTER FOR FLEXI CONT (MISCELLANEOUS) ×2 IMPLANT
BLADE HEX COATED 2.75 (ELECTRODE) ×2 IMPLANT
BLADE SURG 15 STRL LF DISP TIS (BLADE) ×1 IMPLANT
BLADE SURG 15 STRL SS (BLADE) ×2
COVER SURGICAL LIGHT HANDLE (MISCELLANEOUS) ×2 IMPLANT
DECANTER SPIKE VIAL GLASS SM (MISCELLANEOUS) ×2 IMPLANT
DERMABOND ADVANCED (GAUZE/BANDAGES/DRESSINGS) ×1
DERMABOND ADVANCED .7 DNX12 (GAUZE/BANDAGES/DRESSINGS) ×1 IMPLANT
DRAPE C-ARM 42X120 X-RAY (DRAPES) ×2 IMPLANT
DRAPE LAPAROTOMY T 98X78 PEDS (DRAPES) ×2 IMPLANT
DRAPE UTILITY XL STRL (DRAPES) ×2 IMPLANT
ELECT REM PT RETURN 15FT ADLT (MISCELLANEOUS) ×2 IMPLANT
GAUZE 4X4 16PLY RFD (DISPOSABLE) ×2 IMPLANT
GLOVE BIOGEL M 8.0 STRL (GLOVE) ×2 IMPLANT
GLOVE SURG UNDER POLY LF SZ7 (GLOVE) ×2 IMPLANT
GOWN SPEC L4 XLG W/TWL (GOWN DISPOSABLE) ×2 IMPLANT
GOWN STRL REUS W/TWL XL LVL3 (GOWN DISPOSABLE) ×6 IMPLANT
KIT BASIN OR (CUSTOM PROCEDURE TRAY) ×2 IMPLANT
KIT PORT POWER 8FR ISP CVUE (Port) ×2 IMPLANT
KIT TURNOVER KIT A (KITS) ×2 IMPLANT
NEEDLE HYPO 22GX1.5 SAFETY (NEEDLE) ×2 IMPLANT
PACK BASIC VI WITH GOWN DISP (CUSTOM PROCEDURE TRAY) ×2 IMPLANT
SUT PROLENE 2 0 CT2 30 (SUTURE) ×4 IMPLANT
SUT PROLENE 2 0 SH DA (SUTURE) IMPLANT
SUT VIC AB 4-0 SH 18 (SUTURE) ×2 IMPLANT
SYR 10ML ECCENTRIC (SYRINGE) ×2 IMPLANT
SYR 10ML LL (SYRINGE) ×2 IMPLANT
SYR BULB IRRIG 60ML STRL (SYRINGE) IMPLANT
SYR CONTROL 10ML LL (SYRINGE) ×2 IMPLANT
TOWEL OR 17X26 10 PK STRL BLUE (TOWEL DISPOSABLE) ×2 IMPLANT
TOWEL OR NON WOVEN STRL DISP B (DISPOSABLE) ×2 IMPLANT
YANKAUER SUCT BULB TIP 10FT TU (MISCELLANEOUS) ×2 IMPLANT

## 2020-08-24 NOTE — Anesthesia Procedure Notes (Signed)
Procedure Name: LMA Insertion Performed by: West Pugh, CRNA Pre-anesthesia Checklist: Patient identified, Emergency Drugs available, Suction available, Patient being monitored and Timeout performed Patient Re-evaluated:Patient Re-evaluated prior to induction Oxygen Delivery Method: Circle system utilized Preoxygenation: Pre-oxygenation with 100% oxygen Induction Type: IV induction LMA: LMA with gastric port inserted LMA Size: 4.0 Tube secured with: Tape Dental Injury: Teeth and Oropharynx as per pre-operative assessment

## 2020-08-24 NOTE — H&P (Signed)
Chief Complaint:  IV access needed for chemotherapy  History of Present Illness:  Jeffrey Young is an 55 y.o. male who recently underwent left colectomy for colon colon cancer that proved to have positive lymph nodes.  Portacath needed for chemotherapy.  Informed consent obtain in the holding area preop.    Past Medical History:  Diagnosis Date  . Arthritis    Hip  . Colon cancer ([place])   . Diabetic mononeuropathy associated with type 2 diabetes mellitus ([place]) 03/21/2017  . Diabetic retinopathy ([place]) 07/31/2020  . Enthesopathy of ankle and tarsus 04/02/2009   Formatting of this note might be different from the original. Metatarsalgia  10/1 IMO update  . Erectile dysfunction associated with type 2 diabetes mellitus ([place]) 05/08/2019  . Hyperlipidemia 07/31/2020  . Hypertension associated with diabetes ([place]) 06/07/2019  . Microalbuminuria due to type 2 diabetes mellitus ([place]) 03/21/2017  . Necrotizing fasciitis of ankle and foot ([place]) 01/22/2018  . Necrotizing soft tissue infection   . Status post transmetatarsal amputation of left foot ([place]) 01/22/2018  . Systolic heart failure ([place]) 07/31/2020  . 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 Type 2 Diabetes Mellitus - Uncomplicated, Uncontrolled    Past Surgical History:  Procedure Laterality Date  . AMPUTATION Left 01/22/2018   Procedure: TRANSMETATARSAL AMPUTATION;  Surgeon: Newt Minion, MD;  Location: [facility];  Service: Orthopedics;  Laterality: Left;toes  . BIOPSY  07/27/2020   Procedure: BIOPSY;  Surgeon: Carol Ada, MD;  Location[facility];  Service: Endoscopy;;  . COLON RESECTION N/A 07/30/2020   Procedure: HAND ASSISTED LAPAROSCOPIC LEFT HEMI COLECTOMY;  Surgeon: Johnathan Hausen, MD;  [facility];  Service: General;  Laterality: N/A;  .  COLONOSCOPY WITH PROPOFOL N/A 05/24/2018   Procedure: COLONOSCOPY WITH PROPOFOL;  Surgeon: Carol Ada, MD;  [facility];  Service: Endoscopy;  Laterality: N/A;  . COLONOSCOPY WITH PROPOFOL N/A 07/27/2020   Procedure: COLONOSCOPY WITH PROPOFOL;  Surgeon: Carol Ada, MD;  [facility];  Service: Endoscopy;  Laterality: N/A;  . POLYPECTOMY  05/24/2018   Procedure: POLYPECTOMY;  Surgeon: Carol Ada, MD;  [facility];  Service: Endoscopy;;  . [place] INJECTION  07/27/2020   Procedure: SUBMUCOSAL TATTOO INJECTION;  Surgeon: Carol Ada, MD;  [facility];  Service: Endoscopy;;    No current facility-administered medications for this encounter.   Current Outpatient Medications  Medication Sig Dispense Refill  . amLODipine (NORVASC) 5 MG tablet Take 1 tablet (5 mg total) by mouth daily. 30 tablet 0  . atorvastatin (LIPITOR) 10 MG tablet Take 10 mg by mouth at bedtime.    . carvedilol (COREG) 3.125 MG tablet Take 1 tablet (3.125 mg total) by mouth 2 (two) times daily with a meal. 60 tablet 0  . feeding supplement (ENSURE SURGERY) LIQD Take 237 mLs by mouth 2 (two) times daily between meals. 5000 mL 0  . Insulin Glargine-Lixisenatide 100-33 UNT-MCG/ML SOPN Inject 10-15 Units into the skin See admin instructions. Inject 10u under the skin in the morning and 15u under the in the evening    . pantoprazole (PROTONIX) 40 MG tablet Take 1 tablet (40 mg total) by mouth 2 (two) times daily. 60 tablet 0  . polycarbophil (FIBERCON) 625 MG tablet Take 1 tablet (625 mg total) by mouth 2 (two) times daily. 60 tablet 0  .  lidocaine-prilocaine (EMLA) cream Apply 1 application topically as directed. Apply to port site 1-2 hours prior to stick and cover with plastic wrap to numb site 30 g 2  . ondansetron (ZOFRAN) 8 MG tablet Take 1 tablet (8 mg total) by mouth every 8 (eight) hours as needed for nausea or vomiting. Start 72 hours after IV chemotherapy treatment 30  tablet 1  . prochlorperazine (COMPAZINE) 10 MG tablet Take 1 tablet (10 mg total) by mouth every 6 (six) hours as needed for nausea or vomiting. 60 tablet 1   Bee venom and Latex Family History  Problem Relation Age of Onset  . Hypertension Father    Social History:   reports that he has never smoked. He has never used smokeless tobacco. He reports current alcohol use of about 1.0 standard drink of alcohol per week. He reports that he does not use drugs.   REVIEW OF SYSTEMS : Negative except for see problem list  Physical Exam:   There were no vitals taken for this visit. There is no height or weight on file to calculate BMI.  Gen:  WDWN AAM NAD  Neurological: Alert and oriented to person, place, and time. Motor and sensory function is grossly intact  Head: Normocephalic and atraumatic.  Eyes: Conjunctivae are normal. Pupils are equal, round, and reactive to light. No scleral icterus.  Neck: Normal range of motion. Neck supple. No tracheal deviation or thyromegaly present.  Cardiovascular:  SR without murmurs or gallops.  No carotid bruits Breast:  Not examined Respiratory: Effort normal.  No respiratory distress. No chest wall tenderness. Breath sounds normal.  No wheezes, rales or rhonchi.  Abdomen:  Incision healed  GU:  Not examined Musculoskeletal: Normal range of motion. Extremities are nontender. No cyanosis, edema or clubbing noted Lymphadenopathy: No cervical, preauricular, postauricular or axillary adenopathy is present Skin: Skin is warm and dry. No rash noted. No diaphoresis. No erythema. No pallor. Pscyh: Normal mood and affect. Behavior is normal. Judgment and thought content normal.   LABORATORY RESULTS: Results for orders placed or performed during the hospital encounter of 08/23/20 (from the past 48 hour(s))  Surgical pcr screen     Status: Abnormal   Collection Time: 08/23/20  7:49 AM   Specimen: Nasal Mucosa; Nasal Swab  Result Value Ref Range   MRSA, PCR  POSITIVE (A) NEGATIVE    Comment: RESULT CALLED TO, READ BACK BY AND VERIFIED WITH: NEAL G. 04.04.22 @ 1045 BY MECIAL J.    Staphylococcus aureus POSITIVE (A) NEGATIVE    Comment: RESULT CALLED TO, READ BACK BY AND VERIFIED WITH: NEAL G. 04.04.22 @ 1045 BY MECIAL J. (NOTE) The Xpert SA Assay (FDA approved for NASAL specimens in patients 74 years of age and older), is one component of a comprehensive surveillance program. It is not intended to diagnose infection nor to guide or monitor treatment. Performed at [facility], [place] [address], [place], [place] [address]   Glucose, capillary     Status: Abnormal   Collection Time: 08/23/20  7:58 AM  Result Value Ref Range   Glucose-Capillary 154 (H) 70 - 99 mg/dL    Comment: Glucose reference range applies only to samples taken after fasting for at least 8 hours.     RADIOLOGY RESULTS: No results found.  Problem List: Patient Active Problem List   Diagnosis Date Noted  . 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 IDA (iron deficiency anemia) from bleeding colon cancer 07/31/2020  .  Diabetic retinopathy ([place]) 07/31/2020  . Hyperlipidemia 07/31/2020  . Low back pain 07/31/2020  . Shortness of breath 07/31/2020  . Systolic heart failure ([place]) 07/31/2020  . CKD (chronic kidney disease) stage 3, GFR 30-59 ml/min (H[facility]) 07/31/2020  . Insulin-requiring or dependent type II diabetes mellitus ([place]) 07/31/2020  . ARF (acute renal failure) ([place]) 07/25/2020  . Symptomatic anemia   . New onset of congestive heart failure ([place]) 07/24/2020  . GIB (gastrointestinal bleeding) 07/24/2020  . Hypertension associated with diabetes ([place]) 06/07/2019  . Erectile dysfunction associated with type 2 diabetes mellitus ([place]) 05/08/2019  . Sepsis ([place]) 01/22/2018  . Diabetic ulcer of left foot ([place]) 01/22/2018  . Status post transmetatarsal amputation of left foot ([place]) 01/22/2018  . Diabetic mononeuropathy  associated with type 2 diabetes mellitus ([place]) 03/21/2017  . Microalbuminuria due to type 2 diabetes mellitus ([place]) 03/21/2017  . Vitamin D deficiency 04/06/2009  . 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 Enthesopathy of ankle and tarsus 04/02/2009    Assessment & Plan: Chemotherapy plans and need for portacath.  Plan left subclavian placement.  Procedure explained with risks.      [name], MD, [facility] [phone] beeper [phone]  08/24/2020 6:55 AM

## 2020-08-24 NOTE — Transfer of Care (Signed)
Immediate Anesthesia Transfer of Care Note  Patient: Jeffrey Young  Procedure(s) Performed: INSERTION PORT-A-CATH (Left Chest)  Patient Location: PACU  Anesthesia Type:General  Level of Consciousness: drowsy and patient cooperative  Airway & Oxygen Therapy: Patient Spontanous Breathing and Patient connected to face mask oxygen  Post-op Assessment: Report given to RN and Post -op Vital signs reviewed and stable  Post vital signs: Reviewed and stable  Last Vitals:  Vitals Value Taken Time  BP 137/89 08/24/20 1037  Temp 36.7 C 08/24/20 1037  Pulse 71 08/24/20 1041  Resp 11 08/24/20 1041  SpO2 100 % 08/24/20 1041  Vitals shown include unvalidated device data.  Last Pain:  Vitals:   08/24/20 1037  TempSrc:   PainSc: 0-No pain         Complications: No complications documented.

## 2020-08-24 NOTE — Op Note (Signed)
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  After anesthesia was administered and the patient was prepped  with chloroprep  and a timeout was performed.  The patient had a roll along his spine and in the [place] position.  The subclavian vein was cannulated on the first pass and the position of the wire check with the C arm.    A pocket for the port was made and the flushed catheter passed to the exit site at the wire.  Prolene sutures were placed to subsequently secure the port.  Meanwhile, the catheter was inserted through the peel away without difficulty and the sheath removed en toto.  The port aspirated and flushed easily.  It was at ~ 23 cm from the skin and on fluoro it was not moving to suggest that it was in the heart.    The tubing was cut and attached to the port and locked with the port lock and implanted.  Sutures were placed in the silicone and it was secured in the pocket.  The catheter flushed and aspirated easily and was then flushed with concentrated heparin.    The wounds were closed with 4-0 vicryl and Dermabond.    The patient tolerated the procedure well and was taken to the PACU in stable condition.     [name], [place], [facility], [place]

## 2020-08-24 NOTE — Interval H&P Note (Signed)
History and Physical Interval Note:  08/24/2020 8:26 AM  Jeffrey Young  has presented today for surgery, with the diagnosis of CANCER OF COLON FOR CHEMOTHERAPY.  The various methods of treatment have been discussed with the patient and family. After consideration of risks, benefits and other options for treatment, the patient has consented to  Procedure(s) with comments: INSERTION PORT-A-CATH (N/A) - 75/rm1 as a surgical intervention.  The patient's history has been reviewed, patient examined, no change in status, stable for surgery.  I have reviewed the patient's chart and labs.  Questions were answered to the patient's satisfaction.     [name]

## 2020-08-24 NOTE — Discharge Instructions (Signed)
Implanted Port Insertion, Care After This sheet gives you information about how to care for yourself after your procedure. Your health care provider may also give you more specific instructions. If you have problems or questions, contact your health care provider. What can I expect after the procedure? After the procedure, it is common to have:  Discomfort at the port insertion site.  Bruising on the skin over the port. This should improve over 3-4 days. Follow these instructions at home: Port care  After your port is placed, you will get a manufacturer's information card. The card has information about your port. Keep this card with you at all times.  Take care of the port as told by your health care provider. Ask your health care provider if you or a family member can get training for taking care of the port at home. A home health care nurse may also take care of the port.  Make sure to remember what type of port you have. Incision care  Follow instructions from your health care provider about how to take care of your port insertion site. Make sure you: ? Wash your hands with soap and water before and after you change your bandage (dressing). If soap and water are not available, use hand sanitizer. ? Change your dressing as told by your health care provider. ? Leave stitches (sutures), skin glue, or adhesive strips in place. These skin closures may need to stay in place for 2 weeks or longer. If adhesive strip edges start to loosen and curl up, you may trim the loose edges. Do not remove adhesive strips completely unless your health care provider tells you to do that.  Check your port insertion site every day for signs of infection. Check for: ? Redness, swelling, or pain. ? Fluid or blood. ? Warmth. ? Pus or a bad smell.      Activity  Return to your normal activities as told by your health care provider. Ask your health care provider what activities are safe for you.  Do not  lift anything that is heavier than 10 lb (4.5 kg), or the limit that you are told, until your health care provider says that it is safe. General instructions  Take over-the-counter and prescription medicines only as told by your health care provider.  Do not take baths, swim, or use a hot tub until your health care provider approves. Ask your health care provider if you may take showers. You may only be allowed to take sponge baths.  Do not drive for 24 hours if you were given a sedative during your procedure.  Wear a medical alert bracelet in case of an emergency. This will tell any health care providers that you have a port.  Keep all follow-up visits as told by your health care provider. This is important. Contact a health care provider if:  You cannot flush your port with saline as directed, or you cannot draw blood from the port.  You have a fever or chills.  You have redness, swelling, or pain around your port insertion site.  You have fluid or blood coming from your port insertion site.  Your port insertion site feels warm to the touch.  You have pus or a bad smell coming from the port insertion site. Get help right away if:  You have chest pain or shortness of breath.  You have bleeding from your port that you cannot control. Summary  Take care of the port as told by your   health care provider. Keep the manufacturer's information card with you at all times.  Change your dressing as told by your health care provider.  Contact a health care provider if you have a fever or chills or if you have redness, swelling, or pain around your port insertion site.  Keep all follow-up visits as told by your health care provider. This information is not intended to replace advice given to you by your health care provider. Make sure you discuss any questions you have with your health care provider. Document Revised: 12/04/2017 Document Reviewed: 12/04/2017 Elsevier Patient Education   2021 Elsevier Inc.  

## 2020-08-24 NOTE — Anesthesia Postprocedure Evaluation (Signed)
Anesthesia Post Note  Patient: Jeffrey Young  Procedure(s) Performed: INSERTION PORT-A-CATH (Left Chest)     Patient location during evaluation: PACU Anesthesia Type: General Level of consciousness: awake and alert Pain management: pain level controlled Vital Signs Assessment: post-procedure vital signs reviewed and stable Respiratory status: spontaneous breathing, nonlabored ventilation and respiratory function stable Cardiovascular status: blood pressure returned to baseline and stable Postop Assessment: no apparent nausea or vomiting Anesthetic complications: no   No complications documented.  Last Vitals:  Vitals:   08/24/20 1115 08/24/20 1130  BP: (!) 157/89 (!) 160/94  Pulse: 73 74  Resp: (!) 26 13  Temp:  36.6 C  SpO2: 94% 96%    Last Pain:  Vitals:   08/24/20 1130  TempSrc:   PainSc: 5                  Lidia Collum

## 2020-08-25 ENCOUNTER — Encounter (HOSPITAL_COMMUNITY): Payer: Self-pay | Admitting: Surgery

## 2020-08-27 ENCOUNTER — Encounter: Payer: Self-pay | Admitting: *Deleted

## 2020-08-27 NOTE — Progress Notes (Unsigned)
Per Dr. Benay Spice: Patient does not need labs on 08/30/20. May treat based on labs from 08/16/20. BUN and creatinine are normally elevated.

## 2020-08-28 ENCOUNTER — Other Ambulatory Visit: Payer: Self-pay | Admitting: Oncology

## 2020-08-30 ENCOUNTER — Telehealth: Payer: Self-pay | Admitting: Oncology

## 2020-08-30 ENCOUNTER — Inpatient Hospital Stay: Payer: 59 | Attending: Oncology

## 2020-08-30 ENCOUNTER — Inpatient Hospital Stay: Payer: 59

## 2020-08-30 ENCOUNTER — Other Ambulatory Visit: Payer: Self-pay

## 2020-08-30 ENCOUNTER — Other Ambulatory Visit: Payer: 59

## 2020-08-30 ENCOUNTER — Inpatient Hospital Stay (HOSPITAL_BASED_OUTPATIENT_CLINIC_OR_DEPARTMENT_OTHER): Payer: 59 | Admitting: Oncology

## 2020-08-30 VITALS — BP 172/90 | HR 77 | Temp 98.0°F | Resp 20 | Wt 214.0 lb

## 2020-08-30 VITALS — BP 165/95 | HR 79 | Temp 97.9°F | Resp 20

## 2020-08-30 DIAGNOSIS — Z8719 Personal history of other diseases of the digestive system: Secondary | ICD-10-CM | POA: Diagnosis not present

## 2020-08-30 DIAGNOSIS — C185 Malignant neoplasm of splenic flexure: Secondary | ICD-10-CM

## 2020-08-30 DIAGNOSIS — D500 Iron deficiency anemia secondary to blood loss (chronic): Secondary | ICD-10-CM | POA: Diagnosis not present

## 2020-08-30 DIAGNOSIS — Z794 Long term (current) use of insulin: Secondary | ICD-10-CM | POA: Insufficient documentation

## 2020-08-30 DIAGNOSIS — I5031 Acute diastolic (congestive) heart failure: Secondary | ICD-10-CM | POA: Diagnosis not present

## 2020-08-30 DIAGNOSIS — R970 Elevated carcinoembryonic antigen [CEA]: Secondary | ICD-10-CM

## 2020-08-30 DIAGNOSIS — R110 Nausea: Secondary | ICD-10-CM | POA: Insufficient documentation

## 2020-08-30 DIAGNOSIS — E1140 Type 2 diabetes mellitus with diabetic neuropathy, unspecified: Secondary | ICD-10-CM | POA: Insufficient documentation

## 2020-08-30 DIAGNOSIS — N289 Disorder of kidney and ureter, unspecified: Secondary | ICD-10-CM | POA: Diagnosis not present

## 2020-08-30 DIAGNOSIS — I110 Hypertensive heart disease with heart failure: Secondary | ICD-10-CM | POA: Insufficient documentation

## 2020-08-30 DIAGNOSIS — Z79899 Other long term (current) drug therapy: Secondary | ICD-10-CM | POA: Insufficient documentation

## 2020-08-30 DIAGNOSIS — C186 Malignant neoplasm of descending colon: Secondary | ICD-10-CM | POA: Insufficient documentation

## 2020-08-30 DIAGNOSIS — Z5111 Encounter for antineoplastic chemotherapy: Secondary | ICD-10-CM | POA: Insufficient documentation

## 2020-08-30 DIAGNOSIS — Z95828 Presence of other vascular implants and grafts: Secondary | ICD-10-CM

## 2020-08-30 DIAGNOSIS — R197 Diarrhea, unspecified: Secondary | ICD-10-CM | POA: Diagnosis not present

## 2020-08-30 DIAGNOSIS — K922 Gastrointestinal hemorrhage, unspecified: Secondary | ICD-10-CM | POA: Insufficient documentation

## 2020-08-30 DIAGNOSIS — Z9221 Personal history of antineoplastic chemotherapy: Secondary | ICD-10-CM | POA: Insufficient documentation

## 2020-08-30 LAB — CBC WITH DIFFERENTIAL (CANCER CENTER ONLY)
Abs Immature Granulocytes: 0.1 10*3/uL — ABNORMAL HIGH (ref 0.00–0.07)
Basophils Absolute: 0.1 10*3/uL (ref 0.0–0.1)
Basophils Relative: 1 %
Eosinophils Absolute: 0.3 10*3/uL (ref 0.0–0.5)
Eosinophils Relative: 4 %
HCT: 28.2 % — ABNORMAL LOW (ref 39.0–52.0)
Hemoglobin: 9.1 g/dL — ABNORMAL LOW (ref 13.0–17.0)
Immature Granulocytes: 1 %
Lymphocytes Relative: 17 %
Lymphs Abs: 1.3 10*3/uL (ref 0.7–4.0)
MCH: 26.7 pg (ref 26.0–34.0)
MCHC: 32.3 g/dL (ref 30.0–36.0)
MCV: 82.7 fL (ref 80.0–100.0)
Monocytes Absolute: 0.9 10*3/uL (ref 0.1–1.0)
Monocytes Relative: 11 %
Neutro Abs: 5.1 10*3/uL (ref 1.7–7.7)
Neutrophils Relative %: 66 %
Platelet Count: 189 10*3/uL (ref 150–400)
RBC: 3.41 MIL/uL — ABNORMAL LOW (ref 4.22–5.81)
RDW: 16.8 % — ABNORMAL HIGH (ref 11.5–15.5)
WBC Count: 7.7 10*3/uL (ref 4.0–10.5)
nRBC: 0 % (ref 0.0–0.2)

## 2020-08-30 LAB — CMP (CANCER CENTER ONLY)
ALT: 10 U/L (ref 0–44)
AST: 14 U/L — ABNORMAL LOW (ref 15–41)
Albumin: 3.8 g/dL (ref 3.5–5.0)
Alkaline Phosphatase: 109 U/L (ref 38–126)
Anion gap: 7 (ref 5–15)
BUN: 46 mg/dL — ABNORMAL HIGH (ref 6–20)
CO2: 25 mmol/L (ref 22–32)
Calcium: 9.2 mg/dL (ref 8.9–10.3)
Chloride: 99 mmol/L (ref 98–111)
Creatinine: 2.6 mg/dL — ABNORMAL HIGH (ref 0.61–1.24)
GFR, Estimated: 28 mL/min — ABNORMAL LOW (ref >60)
Glucose, Bld: 291 mg/dL — ABNORMAL HIGH (ref 70–99)
Potassium: 4.1 mmol/L (ref 3.5–5.1)
Sodium: 131 mmol/L — ABNORMAL LOW (ref 135–145)
Total Bilirubin: 0.7 mg/dL (ref 0.3–1.2)
Total Protein: 7.4 g/dL (ref 6.5–8.1)

## 2020-08-30 LAB — CEA (IN HOUSE-CHCC): CEA (CHCC-In House): 37.04 ng/mL — ABNORMAL HIGH (ref 0.00–5.00)

## 2020-08-30 MED ORDER — SODIUM CHLORIDE 0.9% FLUSH
10.0000 mL | INTRAVENOUS | Status: DC | PRN
  Filled 2020-08-30: qty 10

## 2020-08-30 MED ORDER — OXALIPLATIN CHEMO INJECTION 100 MG/20ML
65.0000 mg/m2 | Freq: Once | INTRAVENOUS | Status: AC
  Administered 2020-08-30: 145 mg via INTRAVENOUS
  Filled 2020-08-30: qty 10

## 2020-08-30 MED ORDER — SODIUM CHLORIDE 0.9 % IV SOLN
2400.0000 mg/m2 | INTRAVENOUS | Status: DC
  Administered 2020-08-30: 5400 mg via INTRAVENOUS
  Filled 2020-08-30: qty 108

## 2020-08-30 MED ORDER — DEXTROSE 5 % IV SOLN
Freq: Once | INTRAVENOUS | Status: AC
  Filled 2020-08-30: qty 250

## 2020-08-30 MED ORDER — PALONOSETRON HCL INJECTION 0.25 MG/5ML
0.2500 mg | Freq: Once | INTRAVENOUS | Status: AC
  Administered 2020-08-30: 0.25 mg via INTRAVENOUS
  Filled 2020-08-30: qty 5

## 2020-08-30 MED ORDER — HEPARIN SOD (PORK) LOCK FLUSH 100 UNIT/ML IV SOLN
500.0000 [IU] | Freq: Once | INTRAVENOUS | Status: DC | PRN
  Filled 2020-08-30: qty 5

## 2020-08-30 MED ORDER — HEPARIN SOD (PORK) LOCK FLUSH 100 UNIT/ML IV SOLN
500.0000 [IU] | Freq: Once | INTRAVENOUS | Status: DC
  Filled 2020-08-30: qty 5

## 2020-08-30 MED ORDER — DEXTROSE 5 % IV SOLN
Freq: Once | INTRAVENOUS | Status: AC
Start: 2020-08-30 — End: 2020-08-30
  Filled 2020-08-30: qty 250

## 2020-08-30 MED ORDER — SODIUM CHLORIDE 0.9% FLUSH
10.0000 mL | INTRAVENOUS | Status: DC | PRN
  Administered 2020-08-30: 10 mL via INTRAVENOUS
  Filled 2020-08-30: qty 10

## 2020-08-30 MED ORDER — FLUOROURACIL CHEMO INJECTION 2.5 GM/50ML
400.0000 mg/m2 | Freq: Once | INTRAVENOUS | Status: AC
  Administered 2020-08-30: 900 mg via INTRAVENOUS
  Filled 2020-08-30: qty 18

## 2020-08-30 MED ORDER — LEUCOVORIN CALCIUM INJECTION 350 MG
400.0000 mg/m2 | Freq: Once | INTRAVENOUS | Status: AC
  Administered 2020-08-30: 904 mg via INTRAVENOUS
  Filled 2020-08-30: qty 45.2

## 2020-08-30 MED ORDER — SODIUM CHLORIDE 0.9 % IV SOLN
10.0000 mg | Freq: Once | INTRAVENOUS | Status: AC
  Administered 2020-08-30: 10 mg via INTRAVENOUS
  Filled 2020-08-30: qty 1

## 2020-08-30 NOTE — Patient Instructions (Signed)
[place] Discharge Instructions for Patients Receiving Chemotherapy   Today you received the following chemotherapy agents Oxaliplatin, Leucovorin, Fluorouracil  To help prevent nausea and vomiting after your treatment, we encourage you to take your nausea medication: take compazine on 4/11, 4/12, 4/13. On 4/14 may restart taking Zofran (ondansetron) if needed for nausea.  If you develop nausea and vomiting that is not controlled by your nausea medication, call the clinic.   BELOW ARE SYMPTOMS THAT SHOULD BE REPORTED IMMEDIATELY:  *FEVER GREATER THAN 100.5 F  *CHILLS WITH OR WITHOUT FEVER  NAUSEA AND VOMITING THAT IS NOT CONTROLLED WITH YOUR NAUSEA MEDICATION  *UNUSUAL SHORTNESS OF BREATH  *UNUSUAL BRUISING OR BLEEDING  TENDERNESS IN MOUTH AND THROAT WITH OR WITHOUT PRESENCE OF ULCERS  *URINARY PROBLEMS  *BOWEL PROBLEMS  UNUSUAL RASH Items with * indicate a potential emergency and should be followed up as soon as possible.  Feel free to call the clinic should you have any questions or concerns at The clinic phone number is [phone].  Please show the [place] at check-in to the Emergency Department and triage nurse.

## 2020-08-30 NOTE — Progress Notes (Signed)
  [place] OFFICE PROGRESS NOTE   Diagnosis: Colon cancer  INTERVAL HISTORY:   Jeffrey Young underwent Port-A-Cath placement on 08/24/2020.  He has attended a chemotherapy teaching class.  He feels well.  He has attended a chemotherapy teaching class.  Objective:  Vital signs in last 24 hours:  Blood pressure (!) 172/90, pulse 77, temperature 98 F (36.7 C), resp. rate 20, weight 214 lb (97.1 kg), SpO2 100 %.    Resp: Lungs clear bilaterally Cardio: Regular rate and rhythm GI: No hepatomegaly, healed midline incision with a single staple in place Vascular: Trace edema at the right greater than left lower leg    Portacath/PICC-without erythema  Lab Results:  Lab Results  Component Value Date   WBC 7.2 08/16/2020   HGB 9.0 (L) 08/16/2020   HCT 28.6 (L) 08/16/2020   MCV 84.9 08/16/2020   PLT 500 (H) 08/16/2020   NEUTROABS 4.4 08/16/2020    CMP  Lab Results  Component Value Date   NA 137 08/16/2020   K 4.3 08/16/2020   CL 102 08/16/2020   CO2 24 08/16/2020   GLUCOSE 231 (H) 08/16/2020   BUN 29 (H) 08/16/2020   CREATININE 2.01 (H) 08/16/2020   CALCIUM 8.8 (L) 08/16/2020   PROT 7.4 08/16/2020   ALBUMIN 3.3 (L) 08/16/2020   AST 13 (L) 08/16/2020   ALT 11 08/16/2020   ALKPHOS 113 08/16/2020   BILITOT 0.5 08/16/2020   GFRNONAA 38 (L) 08/16/2020   GFRAA >60 01/26/2018    Lab Results  Component Value Date   CEA1 22.71 (H) 08/16/2020    No results found for: INR  Imaging:  No results found.  Medications: I have reviewed the patient's current medications.   Assessment/Plan:  1. Descending colon cancer, stage IIIc (T3N2b M0), status post a partial left colectomy 07/30/2020, 9/16 lymph nodes positive, lymphovascular invasion, 1 satellite nodule, negative margins, MSS, no loss of mismatch repair protein expression -History of large polyp in the left side of the colon-referred to [facility] in 05/2018 for procedure canceled secondary to COVID-19 pandemic.   Procedure was not rescheduled. -CT chest/abdomen/pelvis with contrast 07/27/2020-3 small pulmonary nodules less than 5 mm favored to be benign, circumferential luminal narrowing of the distal transverse colon concerning for malignancy, no metastatic adenopathy in the mesentery porta hepatis, no for metastasis. -CEA on 07/27/2020 was 17.3 -Colonoscopy performed 07/27/2020 showed a fungating, infiltrative and ulcerated nonobstructing large mass in the proximal descending colon.  Biopsy-adenocarcinoma -Cycle 1 FOLFOX 08/30/2020 2.  Anemia due to GI bleeding, iron deficiency?,  Renal insufficiency? 3.  New onset acute diastolic CHF 4.  Diabetes mellitus 5.  Renal insufficiency 6.  Hypertension 7.  History of left transmetatarsal amputation 8.  History of colon polyps 9.  Neuropathy     Disposition: Jeffrey Young appears stable.  He will complete cycle 1 FOLFOX chemotherapy today.  The CEA was elevated on 08/16/2020.  We will repeat a CEA today.  Jeffrey Young will return for an office visit and cycle 2 chemotherapy in 2 weeks.  Betsy Coder, MD  08/30/2020  9:00 AM

## 2020-08-30 NOTE — Telephone Encounter (Signed)
Scheduled appt per 4/11 los - gave patient avs and calender in infusion.

## 2020-08-30 NOTE — Progress Notes (Signed)
Consent obtained prior to the start of chemotherapy. Patient tolerated first chemo treatment without any difficulty. Patient in stable condition upon discharge. I ambulated patient to elevator where he was going to walk to front entrance and have wife pick him up. Patient to return in 46 hours to have pump removed.

## 2020-08-31 ENCOUNTER — Other Ambulatory Visit: Payer: Self-pay | Admitting: Oncology

## 2020-09-01 ENCOUNTER — Inpatient Hospital Stay: Payer: 59

## 2020-09-01 ENCOUNTER — Other Ambulatory Visit: Payer: Self-pay

## 2020-09-01 ENCOUNTER — Other Ambulatory Visit: Payer: Self-pay | Admitting: Nurse Practitioner

## 2020-09-01 VITALS — BP 187/94 | HR 76 | Temp 98.6°F | Resp 20

## 2020-09-01 DIAGNOSIS — C185 Malignant neoplasm of splenic flexure: Secondary | ICD-10-CM

## 2020-09-01 DIAGNOSIS — Z5111 Encounter for antineoplastic chemotherapy: Secondary | ICD-10-CM | POA: Diagnosis not present

## 2020-09-01 DIAGNOSIS — R112 Nausea with vomiting, unspecified: Secondary | ICD-10-CM

## 2020-09-01 MED ORDER — LORAZEPAM 1 MG PO TABS: 0.5000 mg | ORAL_TABLET | Freq: Once | ORAL | Status: DC

## 2020-09-01 MED ORDER — LORAZEPAM 1 MG PO TABS
ORAL_TABLET | ORAL | Status: AC
  Filled 2020-09-01: qty 1

## 2020-09-01 MED ORDER — DEXAMETHASONE SODIUM PHOSPHATE 10 MG/ML IJ SOLN
INTRAMUSCULAR | Status: AC
  Filled 2020-09-01: qty 1

## 2020-09-01 MED ORDER — SODIUM CHLORIDE 0.9% FLUSH
10.0000 mL | INTRAVENOUS | Status: DC | PRN
  Filled 2020-09-01: qty 10

## 2020-09-01 MED ORDER — LORAZEPAM 1 MG PO TABS
0.5000 mg | ORAL_TABLET | Freq: Once | ORAL | Status: AC
  Administered 2020-09-01: 0.5 mg via ORAL

## 2020-09-01 MED ORDER — HEPARIN SOD (PORK) LOCK FLUSH 100 UNIT/ML IV SOLN
500.0000 [IU] | Freq: Once | INTRAVENOUS | Status: AC | PRN
Start: 2020-09-01 — End: 2020-09-01
  Administered 2020-09-01: 500 [IU]
  Filled 2020-09-01: qty 5

## 2020-09-01 MED ORDER — SODIUM CHLORIDE 0.9 % IV SOLN: 5.0000 mg | Freq: Once | INTRAVENOUS | Status: DC

## 2020-09-01 MED ORDER — SODIUM CHLORIDE 0.9 % IV SOLN
INTRAVENOUS | Status: DC
  Filled 2020-09-01 (×2): qty 250

## 2020-09-01 MED ORDER — LORAZEPAM 0.5 MG PO TABS: 0.5000 mg | ORAL_TABLET | Freq: Three times a day (TID) | ORAL | 0 refills | Status: DC | PRN

## 2020-09-01 MED ORDER — DEXAMETHASONE SODIUM PHOSPHATE 10 MG/ML IJ SOLN
5.0000 mg | Freq: Once | INTRAMUSCULAR | Status: AC
Start: 2020-09-01 — End: 2020-09-01
  Administered 2020-09-01: 5 mg via INTRAVENOUS

## 2020-09-01 NOTE — Progress Notes (Signed)
Patient had an episode of vomiting this morning after drinking water. Patient arrived around 12pm to the clinic to have the pump removed and we proceeded with hanging a liter of normal saline to infuse over 2 hours. Patient was briefly seen by Dr. Benay Spice and Dr. Benay Spice recommended giving Dexamethasone 5mg  IV. Later patient leaned forward and got nauseated - Ned Card NP ordered Ativan .5mg  sublingual to assist with the nausea. Patient currently sitting in chair and not experiencing any nausea. Patient's BP has been high this visit. See VS flowsheet. I discussed this with Ned Card NP and we also reviewed pt's vitals over last few visits. Lattie Haw said that since his BPs have been up over the last few visits that they were okay.  Patient has taken his BP meds (Coreg and Norvasc this morning) and is due one more Coreg this evening. Patient states he will keep an eye on his BP at home. Patient discharged ambulatory and in stable condition to private vehicle.

## 2020-09-01 NOTE — Patient Instructions (Signed)
[place] Discharge Instructions for Patients Receiving Chemotherapy  Today you received 1 liter of Normal [place] over 2 hours. We also gave you Dexamethasone 5mg  for nausea as well as Ativan (lorazepam) .5mg  to assist with relieving nausea.   Ned Card NP has called in a prescription for Lorazepam. This can be taken every 8 hours to relieve/reduce nausea. This medication can make you sleepy. Do not drive until you know how this medication will make you feel. You can start taking ondansetron (zofran) on 4/14 after 12pm. Before then, please take your lorazepam and/or proclorperazine for nausea.   Lorazepam (ativan) .5mg  every 8 hours as needed for nausea/vomiting. Place under the tongue and let it dissolve or you may swallow it.   Proclorperazine 10mg . Take 1 tablet every 6 hours as needed for nausea/vomiting.  Ondansetron/Zofran 8mg . Take 1 tablet every 8 hours as needed for nausea/vomiting. You may start this on 4/14 after 12pm.   If your nausea/vomiting is not controlled by your nausea medication, call the clinic.   BELOW ARE SYMPTOMS THAT SHOULD BE REPORTED IMMEDIATELY:  *FEVER GREATER THAN 100.5 F  *CHILLS WITH OR WITHOUT FEVER  NAUSEA AND VOMITING THAT IS NOT CONTROLLED WITH YOUR NAUSEA MEDICATION  *UNUSUAL SHORTNESS OF BREATH  *UNUSUAL BRUISING OR BLEEDING  TENDERNESS IN MOUTH AND THROAT WITH OR WITHOUT PRESENCE OF ULCERS  *URINARY PROBLEMS  *BOWEL PROBLEMS  UNUSUAL RASH Items with * indicate a potential emergency and should be followed up as soon as possible.  Feel free to call the clinic should you have any questions or concerns at The clinic phone number is [phone].  Please show the [place] at check-in to the Emergency Department and triage nurse.

## 2020-09-02 ENCOUNTER — Encounter: Payer: Self-pay | Admitting: *Deleted

## 2020-09-02 NOTE — Progress Notes (Signed)
Faxed [employer] Disability form to [phone]. Copy to HIM and will give patient copy at next visit.

## 2020-09-07 ENCOUNTER — Encounter: Payer: Self-pay | Admitting: *Deleted

## 2020-09-07 NOTE — Progress Notes (Signed)
Faxed completed [employer] STD form to [phone]. Copy for patient at next visit and copy to HIM.

## 2020-09-12 ENCOUNTER — Other Ambulatory Visit: Payer: Self-pay | Admitting: Oncology

## 2020-09-13 ENCOUNTER — Inpatient Hospital Stay (HOSPITAL_BASED_OUTPATIENT_CLINIC_OR_DEPARTMENT_OTHER): Payer: 59 | Admitting: Nurse Practitioner

## 2020-09-13 ENCOUNTER — Inpatient Hospital Stay: Payer: 59

## 2020-09-13 ENCOUNTER — Encounter: Payer: Self-pay | Admitting: Nurse Practitioner

## 2020-09-13 ENCOUNTER — Other Ambulatory Visit: Payer: Self-pay

## 2020-09-13 ENCOUNTER — Telehealth: Payer: Self-pay | Admitting: Nurse Practitioner

## 2020-09-13 ENCOUNTER — Inpatient Hospital Stay (HOSPITAL_BASED_OUTPATIENT_CLINIC_OR_DEPARTMENT_OTHER): Payer: 59 | Admitting: Oncology

## 2020-09-13 VITALS — BP 145/90 | HR 78 | Temp 97.8°F | Resp 18 | Ht 73.0 in | Wt 208.6 lb

## 2020-09-13 DIAGNOSIS — C185 Malignant neoplasm of splenic flexure: Secondary | ICD-10-CM | POA: Diagnosis not present

## 2020-09-13 DIAGNOSIS — Z5111 Encounter for antineoplastic chemotherapy: Secondary | ICD-10-CM | POA: Diagnosis not present

## 2020-09-13 LAB — CBC WITH DIFFERENTIAL (CANCER CENTER ONLY)
Abs Immature Granulocytes: 0.02 10*3/uL (ref 0.00–0.07)
Basophils Absolute: 0 10*3/uL (ref 0.0–0.1)
Basophils Relative: 1 %
Eosinophils Absolute: 0.6 10*3/uL — ABNORMAL HIGH (ref 0.0–0.5)
Eosinophils Relative: 9 %
HCT: 28 % — ABNORMAL LOW (ref 39.0–52.0)
Hemoglobin: 9.3 g/dL — ABNORMAL LOW (ref 13.0–17.0)
Immature Granulocytes: 0 %
Lymphocytes Relative: 28 %
Lymphs Abs: 1.7 10*3/uL (ref 0.7–4.0)
MCH: 27.6 pg (ref 26.0–34.0)
MCHC: 33.2 g/dL (ref 30.0–36.0)
MCV: 83.1 fL (ref 80.0–100.0)
Monocytes Absolute: 0.6 10*3/uL (ref 0.1–1.0)
Monocytes Relative: 11 %
Neutro Abs: 3 10*3/uL (ref 1.7–7.7)
Neutrophils Relative %: 51 %
Platelet Count: 238 10*3/uL (ref 150–400)
RBC: 3.37 MIL/uL — ABNORMAL LOW (ref 4.22–5.81)
RDW: 17 % — ABNORMAL HIGH (ref 11.5–15.5)
WBC Count: 5.9 10*3/uL (ref 4.0–10.5)
nRBC: 0 % (ref 0.0–0.2)

## 2020-09-13 LAB — CMP (CANCER CENTER ONLY)
ALT: 9 U/L (ref 0–44)
AST: 14 U/L — ABNORMAL LOW (ref 15–41)
Albumin: 3.5 g/dL (ref 3.5–5.0)
Alkaline Phosphatase: 114 U/L (ref 38–126)
Anion gap: 9 (ref 5–15)
BUN: 38 mg/dL — ABNORMAL HIGH (ref 6–20)
CO2: 25 mmol/L (ref 22–32)
Calcium: 8.9 mg/dL (ref 8.9–10.3)
Chloride: 97 mmol/L — ABNORMAL LOW (ref 98–111)
Creatinine: 2.21 mg/dL — ABNORMAL HIGH (ref 0.61–1.24)
GFR, Estimated: 34 mL/min — ABNORMAL LOW (ref >60)
Glucose, Bld: 293 mg/dL — ABNORMAL HIGH (ref 70–99)
Potassium: 3.8 mmol/L (ref 3.5–5.1)
Sodium: 131 mmol/L — ABNORMAL LOW (ref 135–145)
Total Bilirubin: 0.5 mg/dL (ref 0.3–1.2)
Total Protein: 6.8 g/dL (ref 6.5–8.1)

## 2020-09-13 LAB — CEA (ACCESS): CEA (CHCC): 33.59 ng/mL — ABNORMAL HIGH (ref 0.00–5.00)

## 2020-09-13 LAB — CEA (IN HOUSE-CHCC): CEA (CHCC-In House): 27.57 ng/mL — ABNORMAL HIGH (ref 0.00–5.00)

## 2020-09-13 MED ORDER — SODIUM CHLORIDE 0.9% FLUSH
10.0000 mL | INTRAVENOUS | Status: DC | PRN
  Filled 2020-09-13: qty 10

## 2020-09-13 MED ORDER — SODIUM CHLORIDE 0.9 % IV SOLN
2400.0000 mg/m2 | INTRAVENOUS | Status: DC
  Administered 2020-09-13: 5400 mg via INTRAVENOUS
  Filled 2020-09-13: qty 108

## 2020-09-13 MED ORDER — PALONOSETRON HCL INJECTION 0.25 MG/5ML
0.2500 mg | Freq: Once | INTRAVENOUS | Status: AC
  Administered 2020-09-13: 0.25 mg via INTRAVENOUS
  Filled 2020-09-13: qty 5

## 2020-09-13 MED ORDER — HEPARIN SOD (PORK) LOCK FLUSH 100 UNIT/ML IV SOLN
500.0000 [IU] | Freq: Once | INTRAVENOUS | Status: DC | PRN
  Filled 2020-09-13: qty 5

## 2020-09-13 MED ORDER — SODIUM CHLORIDE 0.9 % IV SOLN
10.0000 mg | Freq: Once | INTRAVENOUS | Status: AC
  Administered 2020-09-13: 10 mg via INTRAVENOUS
  Filled 2020-09-13: qty 1

## 2020-09-13 MED ORDER — DEXTROSE 5 % IV SOLN
Freq: Once | INTRAVENOUS | Status: AC
  Filled 2020-09-13: qty 250

## 2020-09-13 MED ORDER — SODIUM CHLORIDE 0.9 % IV SOLN
150.0000 mg | Freq: Once | INTRAVENOUS | Status: AC
  Administered 2020-09-13: 150 mg via INTRAVENOUS
  Filled 2020-09-13: qty 5

## 2020-09-13 MED ORDER — OXALIPLATIN CHEMO INJECTION 100 MG/20ML
65.0000 mg/m2 | Freq: Once | INTRAVENOUS | Status: AC
  Administered 2020-09-13: 145 mg via INTRAVENOUS
  Filled 2020-09-13: qty 20

## 2020-09-13 MED ORDER — FLUOROURACIL CHEMO INJECTION 2.5 GM/50ML
400.0000 mg/m2 | Freq: Once | INTRAVENOUS | Status: AC
  Administered 2020-09-13: 900 mg via INTRAVENOUS
  Filled 2020-09-13: qty 18

## 2020-09-13 MED ORDER — DEXTROSE 5 % IV SOLN
400.0000 mg/m2 | Freq: Once | INTRAVENOUS | Status: AC
  Administered 2020-09-13: 904 mg via INTRAVENOUS
  Filled 2020-09-13: qty 45.2

## 2020-09-13 NOTE — Telephone Encounter (Signed)
Appointments scheduled with patient in infusion per 4/25 los 

## 2020-09-13 NOTE — Progress Notes (Signed)
  [place] OFFICE PROGRESS NOTE   Diagnosis: Colon cancer  INTERVAL HISTORY:   Jeffrey Young returns as scheduled.  He completed cycle 1 FOLFOX 08/30/2020.  He developed nausea day 2.  He received IV fluids, dexamethasone and Ativan on day 3 with the pump discontinuation.  No mouth sores.  He has intermittent diarrhea which he attributes to Ensure.  Energy level varies.  Cold sensitivity lasted 2-3 days.  Objective:  Vital signs in last 24 hours:  Blood pressure (!) 145/90, pulse 78, temperature 97.8 F (36.6 C), temperature source Tympanic, resp. rate 18, height $RemoveBe'6\' 1"'IZBTfvKEE$  (1.854 m), weight 208 lb 9.6 oz (94.6 kg), SpO2 100 %.    HEENT: Mild white coating over tongue.  No buccal thrush.  No ulcers. Resp: Lungs clear bilaterally. Cardio: Regular rate and rhythm. GI: Abdomen soft and nontender.  No hepatomegaly. Vascular: No leg edema. Skin: Palms without erythema. Port-A-Cath without erythema.   Lab Results:  Lab Results  Component Value Date   WBC 5.9 09/13/2020   HGB 9.3 (L) 09/13/2020   HCT 28.0 (L) 09/13/2020   MCV 83.1 09/13/2020   PLT 238 09/13/2020   NEUTROABS 3.0 09/13/2020    Imaging:  No results found.  Medications: I have reviewed the patient's current medications.  Assessment/Plan: 1. Descending colon cancer, stage IIIc (T3N2b M0), status post a partial left colectomy 07/30/2020, 9/16 lymph nodes positive, lymphovascular invasion, 1 satellite nodule, negative margins, MSS, no loss of mismatch repair protein expression -History of large polyp in the left side of the colon-referred to [facility] in 05/2018 for procedure canceled secondary to COVID-19 pandemic. Procedure was not rescheduled. -CT chest/abdomen/pelvis with contrast 07/27/2020-3 small pulmonary nodules less than 5 mm favored to be benign, circumferential luminal narrowing of the distal transverse colon concerning for malignancy, no metastatic adenopathy in the mesentery porta hepatis, no for  metastasis. -CEA on 07/27/2020 was 17.3; 37 on 08/30/2020; 33 on 09/13/2020 -Colonoscopy performed 07/27/2020 showed a fungating, infiltrative and ulcerated nonobstructing large mass in the proximal descending colon.  Biopsy-adenocarcinoma -Cycle 1 FOLFOX 08/30/2020 -Cycle 2 FOLFOX 09/13/2020, Emend added for delayed nausea 2. Anemia due to GI bleeding,iron deficiency?, Renal insufficiency? 3. New onset acute diastolic CHF 4. Diabetes mellitus 5. Renal insufficiency 6. Hypertension 7. History of left transmetatarsal amputation 8.History of colon polyps 9.  Neuropathy  Disposition: Jeffrey Young appears stable.  He is accompanied by his wife to today's appointment.  He has completed 1 cycle of FOLFOX.  He experienced delayed nausea.  Plan to proceed with cycle 2 today as scheduled, add Emend to the premedication regimen.  We discussed the potential for an allergic reaction with Emend.  He agrees to proceed.  We reviewed the CBC, chemistry panel and CEA from today.  Creatinine is elevated, no change from baseline.  Labs adequate to proceed with treatment.  He understands the CEA remains elevated.  We will continue to follow closely with the plan for CT scans after cycle 4 if the CEA is persistently elevated.  He will return for lab, follow-up, cycle 3 FOLFOX in 2 weeks.  He will contact the office in the interim with any problems.  Plan reviewed with Dr. Benay Spice.      Ned Card ANP/GNP-BC   09/13/2020  9:51 AM

## 2020-09-13 NOTE — Progress Notes (Signed)
Patient tolerated chemo without any problems. Discharged ambulatory in stable condition.

## 2020-09-15 ENCOUNTER — Other Ambulatory Visit: Payer: Self-pay

## 2020-09-15 ENCOUNTER — Inpatient Hospital Stay: Payer: 59

## 2020-09-15 VITALS — BP 177/95 | HR 94 | Temp 96.6°F | Resp 18

## 2020-09-15 DIAGNOSIS — C185 Malignant neoplasm of splenic flexure: Secondary | ICD-10-CM

## 2020-09-15 DIAGNOSIS — Z5111 Encounter for antineoplastic chemotherapy: Secondary | ICD-10-CM | POA: Diagnosis not present

## 2020-09-15 MED ORDER — SODIUM CHLORIDE 0.9% FLUSH
10.0000 mL | INTRAVENOUS | Status: DC | PRN
  Administered 2020-09-15: 10 mL
  Filled 2020-09-15: qty 10

## 2020-09-15 MED ORDER — HEPARIN SOD (PORK) LOCK FLUSH 100 UNIT/ML IV SOLN
500.0000 [IU] | Freq: Once | INTRAVENOUS | Status: AC | PRN
  Administered 2020-09-15: 500 [IU]
  Filled 2020-09-15: qty 5

## 2020-09-15 NOTE — Patient Instructions (Signed)

## 2020-09-15 NOTE — Progress Notes (Signed)
Jeffrey Young presents today for 5FU pump discontinuation. Pump discontinued and port flushed with NS and heparin per protocol. Port deaccessed and bandaid applied. VSS. Discharged in satisfactory condition with follow up instructions.

## 2020-09-18 ENCOUNTER — Other Ambulatory Visit: Payer: Self-pay

## 2020-09-18 ENCOUNTER — Emergency Department (HOSPITAL_BASED_OUTPATIENT_CLINIC_OR_DEPARTMENT_OTHER)
Admission: EM | Admit: 2020-09-18 | Discharge: 2020-09-18 | Disposition: A | Discharge status: Home / Self Care | Payer: 59 | Attending: Emergency Medicine | Admitting: Emergency Medicine

## 2020-09-18 ENCOUNTER — Emergency Department (HOSPITAL_BASED_OUTPATIENT_CLINIC_OR_DEPARTMENT_OTHER): Payer: 59 | Admitting: Radiology

## 2020-09-18 ENCOUNTER — Encounter (HOSPITAL_BASED_OUTPATIENT_CLINIC_OR_DEPARTMENT_OTHER): Payer: Self-pay

## 2020-09-18 DIAGNOSIS — Z91040 Latex allergy status: Secondary | ICD-10-CM | POA: Insufficient documentation

## 2020-09-18 DIAGNOSIS — E1141 Type 2 diabetes mellitus with diabetic mononeuropathy: Secondary | ICD-10-CM | POA: Insufficient documentation

## 2020-09-18 DIAGNOSIS — Z794 Long term (current) use of insulin: Secondary | ICD-10-CM | POA: Diagnosis not present

## 2020-09-18 DIAGNOSIS — R112 Nausea with vomiting, unspecified: Secondary | ICD-10-CM | POA: Insufficient documentation

## 2020-09-18 DIAGNOSIS — I130 Hypertensive heart and chronic kidney disease with heart failure and stage 1 through stage 4 chronic kidney disease, or unspecified chronic kidney disease: Secondary | ICD-10-CM | POA: Diagnosis not present

## 2020-09-18 DIAGNOSIS — Z79899 Other long term (current) drug therapy: Secondary | ICD-10-CM | POA: Diagnosis not present

## 2020-09-18 DIAGNOSIS — R079 Chest pain, unspecified: Secondary | ICD-10-CM | POA: Insufficient documentation

## 2020-09-18 DIAGNOSIS — E1122 Type 2 diabetes mellitus with diabetic chronic kidney disease: Secondary | ICD-10-CM | POA: Insufficient documentation

## 2020-09-18 DIAGNOSIS — N183 Chronic kidney disease, stage 3 unspecified: Secondary | ICD-10-CM | POA: Insufficient documentation

## 2020-09-18 DIAGNOSIS — R1110 Vomiting, unspecified: Secondary | ICD-10-CM | POA: Diagnosis present

## 2020-09-18 DIAGNOSIS — I5020 Unspecified systolic (congestive) heart failure: Secondary | ICD-10-CM | POA: Diagnosis not present

## 2020-09-18 LAB — CBC WITH DIFFERENTIAL/PLATELET
Abs Immature Granulocytes: 0.01 10*3/uL (ref 0.00–0.07)
Basophils Absolute: 0 10*3/uL (ref 0.0–0.1)
Basophils Relative: 1 %
Eosinophils Absolute: 0.3 10*3/uL (ref 0.0–0.5)
Eosinophils Relative: 9 %
HCT: 29.7 % — ABNORMAL LOW (ref 39.0–52.0)
Hemoglobin: 9.9 g/dL — ABNORMAL LOW (ref 13.0–17.0)
Immature Granulocytes: 0 %
Lymphocytes Relative: 37 %
Lymphs Abs: 1.3 10*3/uL (ref 0.7–4.0)
MCH: 27.7 pg (ref 26.0–34.0)
MCHC: 33.3 g/dL (ref 30.0–36.0)
MCV: 83 fL (ref 80.0–100.0)
Monocytes Absolute: 0.3 10*3/uL (ref 0.1–1.0)
Monocytes Relative: 8 %
Neutro Abs: 1.5 10*3/uL — ABNORMAL LOW (ref 1.7–7.7)
Neutrophils Relative %: 45 %
Platelets: 215 10*3/uL (ref 150–400)
RBC: 3.58 MIL/uL — ABNORMAL LOW (ref 4.22–5.81)
RDW: 16.9 % — ABNORMAL HIGH (ref 11.5–15.5)
WBC: 3.5 10*3/uL — ABNORMAL LOW (ref 4.0–10.5)
nRBC: 0 % (ref 0.0–0.2)

## 2020-09-18 LAB — COMPREHENSIVE METABOLIC PANEL
ALT: 9 U/L (ref 0–44)
AST: 12 U/L — ABNORMAL LOW (ref 15–41)
Albumin: 3.7 g/dL (ref 3.5–5.0)
Alkaline Phosphatase: 134 U/L — ABNORMAL HIGH (ref 38–126)
Anion gap: 9 (ref 5–15)
BUN: 40 mg/dL — ABNORMAL HIGH (ref 6–20)
CO2: 27 mmol/L (ref 22–32)
Calcium: 9.3 mg/dL (ref 8.9–10.3)
Chloride: 95 mmol/L — ABNORMAL LOW (ref 98–111)
Creatinine, Ser: 2.2 mg/dL — ABNORMAL HIGH (ref 0.61–1.24)
GFR, Estimated: 34 mL/min — ABNORMAL LOW (ref >60)
Glucose, Bld: 362 mg/dL — ABNORMAL HIGH (ref 70–99)
Potassium: 4 mmol/L (ref 3.5–5.1)
Sodium: 131 mmol/L — ABNORMAL LOW (ref 135–145)
Total Bilirubin: 0.8 mg/dL (ref 0.3–1.2)
Total Protein: 7.1 g/dL (ref 6.5–8.1)

## 2020-09-18 MED ORDER — ONDANSETRON HCL 8 MG PO TABS: 8.0000 mg | ORAL_TABLET | Freq: Three times a day (TID) | ORAL | 1 refills | Status: DC | PRN

## 2020-09-18 MED ORDER — ONDANSETRON HCL 4 MG/2ML IJ SOLN
4.0000 mg | Freq: Once | INTRAMUSCULAR | Status: AC
  Administered 2020-09-18: 4 mg via INTRAVENOUS
  Filled 2020-09-18: qty 2

## 2020-09-18 NOTE — ED Provider Notes (Signed)
[facility] EMERGENCY DEPT Provider Note   CSN: [id number] Arrival date & time: 09/18/20  1910     History Chief Complaint  Patient presents with  . Chest Pain    Jeffrey Young is a 55 y.o. male.  HPI Patient presents with vomiting and mid chest pain.  Began today.  States he has been having the feeling of there is something stuck in his mid chest.  States he cannot eat or drink because it will come back up.  Abdomen not more swollen.  History of colon cancer and is on chemotherapy.  Has not had episodes like this before.  Has not had nausea medicine at home.  No vomiting blood.  No shortness of breath.  No fevers or chills.  He for good    Past Medical History:  Diagnosis Date  . Arthritis    Hip  . Colon cancer ([place])   . Diabetic mononeuropathy associated with type 2 diabetes mellitus ([place]) 03/21/2017  . Diabetic retinopathy ([place]) 07/31/2020  . Enthesopathy of ankle and tarsus 04/02/2009   Formatting of this note might be different from the original. Metatarsalgia  10/1 IMO update  . Erectile dysfunction associated with type 2 diabetes mellitus ([place]) 05/08/2019  . Hyperlipidemia 07/31/2020  . Hypertension associated with diabetes ([place]) 06/07/2019  . Microalbuminuria due to type 2 diabetes mellitus ([place]) 03/21/2017  . Necrotizing fasciitis of ankle and foot ([place]) 01/22/2018  . Necrotizing soft tissue infection   . Status post transmetatarsal amputation of left foot ([place]) 01/22/2018  . Systolic heart failure ([place]) 07/31/2020  . 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 Type 2 Diabetes Mellitus - Uncomplicated, Uncontrolled    Patient Active Problem List   Diagnosis Date Noted  . 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 IDA (iron deficiency  anemia) from bleeding colon cancer 07/31/2020  . Diabetic retinopathy ([place]) 07/31/2020  . Hyperlipidemia 07/31/2020  . Low back pain 07/31/2020  . Shortness of breath 07/31/2020  . Systolic heart failure ([place]) 07/31/2020  . CKD (chronic kidney disease) stage 3, GFR 30-59 ml/min ([facility]) 07/31/2020  . Insulin-requiring or dependent type II diabetes mellitus ([place]) 07/31/2020  . ARF (acute renal failure) ([place]) 07/25/2020  . Symptomatic anemia   . New onset of congestive heart failure ([place]) 07/24/2020  . GIB (gastrointestinal bleeding) 07/24/2020  . Hypertension associated with diabetes ([place]) 06/07/2019  . Erectile dysfunction associated with type 2 diabetes mellitus ([place]) 05/08/2019  . Sepsis ([place]) 01/22/2018  . Diabetic ulcer of left foot ([place]) 01/22/2018  . Status post transmetatarsal amputation of left foot ([place]) 01/22/2018  . Diabetic mononeuropathy associated with type 2 diabetes mellitus ([place]) 03/21/2017  . Microalbuminuria due to type 2 diabetes mellitus ([place]) 03/21/2017  . Vitamin D deficiency 04/06/2009  . 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 Enthesopathy of ankle and tarsus 04/02/2009    Past Surgical History:  Procedure Laterality Date  . AMPUTATION Left 01/22/2018   Procedure: TRANSMETATARSAL AMPUTATION;  Surgeon: Newt Minion, MD;  Location: [facility];  Service: Orthopedics;  Laterality: Left;toes  . BIOPSY  07/27/2020   Procedure: BIOPSY;  Surgeon: Carol Ada, MD;  Location: [facility];  Service: Endoscopy;;  . COLON RESECTION N/A  07/30/2020   Procedure: HAND ASSISTED LAPAROSCOPIC LEFT HEMI COLECTOMY;  Surgeon: Johnathan Hausen, MD;  Location: [facility];  Service: General;  Laterality: N/A;  . COLONOSCOPY WITH PROPOFOL N/A 05/24/2018   Procedure: COLONOSCOPY WITH PROPOFOL;  Surgeon: Carol Ada, MD;  Location: [facility];  Service:  Endoscopy;  Laterality: N/A;  . COLONOSCOPY WITH PROPOFOL N/A 07/27/2020   Procedure: COLONOSCOPY WITH PROPOFOL;  Surgeon: Carol Ada, MD;  Location: [facility];  Service: Endoscopy;  Laterality: N/A;  . POLYPECTOMY  05/24/2018   Procedure: POLYPECTOMY;  Surgeon: Carol Ada, MD;  Location: [facility];  Service: Endoscopy;;  . PORTACATH PLACEMENT Left 08/24/2020   Procedure: INSERTION PORT-A-CATH;  Surgeon: Johnathan Hausen, MD;  Location: [facility];  Service: General;  Laterality: Left;  75/rm1  . SUBMUCOSAL TATTOO INJECTION  07/27/2020   Procedure: SUBMUCOSAL TATTOO INJECTION;  Surgeon: Carol Ada, MD;  Location: [facility];  Service: Endoscopy;;       Family History  Problem Relation Age of Onset  . Hypertension Father     Social History   Tobacco Use  . Smoking status: Never Smoker  . Smokeless tobacco: Never Used  Vaping Use  . Vaping Use: Never used  Substance Use Topics  . Alcohol use: Yes    Alcohol/week: 1.0 standard drink    Types: 1 Cans of beer per week    Comment: occasional  . Drug use: Never    Home Medications Prior to Admission medications   Medication Sig Start Date End Date Taking? Authorizing Provider  amLODipine (NORVASC) 5 MG tablet Take 1 tablet (5 mg total) by mouth daily. 08/04/20   Little Ishikawa, MD  atorvastatin (LIPITOR) 10 MG tablet Take 10 mg by mouth at bedtime. 05/08/19   [provider]  carvedilol (COREG) 3.125 MG tablet Take 1 tablet (3.125 mg total) by mouth 2 (two) times daily with a meal. 08/03/20   Little Ishikawa, MD  feeding supplement (ENSURE SURGERY) LIQD Take 237 mLs by mouth 2 (two) times daily between meals. 08/03/20   Little Ishikawa, MD  HYDROcodone-acetaminophen (NORCO/VICODIN) 5-325 MG tablet Take 1 tablet by mouth every 6 (six) hours as needed for moderate pain. 08/24/20   Johnathan Hausen, MD  Insulin Glargine-Lixisenatide 100-33 UNT-MCG/ML SOPN Inject 10-15 Units into the skin See admin instructions.  Inject 10u under the skin in the morning and 15u under the in the evening 06/10/20   [provider]  lidocaine-prilocaine (EMLA) cream Apply 1 application topically as directed. Apply to port site 1-2 hours prior to stick and cover with plastic wrap to numb site 08/17/20   Ladell Pier, MD  LORazepam (ATIVAN) 0.5 MG tablet Take 1 tablet (0.5 mg total) by mouth every 8 (eight) hours as needed (nausea). 09/01/20   Owens Shark, NP  ondansetron (ZOFRAN) 8 MG tablet Take 1 tablet (8 mg total) by mouth every 8 (eight) hours as needed for nausea or vomiting. Start 72 hours after IV chemotherapy treatment 09/18/20   Davonna Belling, MD  pantoprazole (PROTONIX) 40 MG tablet Take 1 tablet (40 mg total) by mouth 2 (two) times daily. 08/03/20   Little Ishikawa, MD  polycarbophil (FIBERCON) 625 MG tablet Take 1 tablet (625 mg total) by mouth 2 (two) times daily. 08/03/20   Little Ishikawa, MD  prochlorperazine (COMPAZINE) 10 MG tablet Take 1 tablet (10 mg total) by mouth every 6 (six) hours as needed for nausea or vomiting. 08/17/20   Ladell Pier, MD  Allergies    Bee venom and Latex  Review of Systems   Review of Systems  Constitutional: Positive for appetite change. Negative for unexpected weight change.  HENT: Negative for congestion.   Cardiovascular: Positive for chest pain.  Gastrointestinal: Positive for nausea and vomiting.  Genitourinary: Negative for flank pain.  Musculoskeletal: Negative for back pain.  Skin: Negative for rash.  Neurological: Negative for weakness.  Psychiatric/Behavioral: Negative for confusion.    Physical Exam Updated Vital Signs BP (!) 142/89 (BP Location: Right Arm)   Pulse 86   Temp 98.3 F (36.8 C) (Oral)   Resp 18   Ht 6\' 1"  (1.854 m)   Wt 92.7 kg   SpO2 99%   BMI 26.96 kg/m   Physical Exam Vitals and nursing note reviewed.  HENT:     Head: Atraumatic.  Eyes:     Pupils: Pupils are equal, round, and reactive to light.   Cardiovascular:     Rate and Rhythm: Regular rhythm.  Pulmonary:     Breath sounds: No wheezing or rhonchi.  Chest:     Chest wall: No tenderness.  Abdominal:     Tenderness: There is no abdominal tenderness.  Musculoskeletal:     Cervical back: Neck supple.     Right lower leg: No edema.     Left lower leg: No edema.  Skin:    General: Skin is warm.     Capillary Refill: Capillary refill takes less than 2 seconds.  Neurological:     Mental Status: He is alert and oriented to person, place, and time.     ED Results / Procedures / Treatments   Labs (all labs ordered are listed, but only abnormal results are displayed) Labs Reviewed  COMPREHENSIVE METABOLIC PANEL - Abnormal; Notable for the following components:      Result Value   Sodium 131 (*)    Chloride 95 (*)    Glucose, Bld 362 (*)    BUN 40 (*)    Creatinine, Ser 2.20 (*)    AST 12 (*)    Alkaline Phosphatase 134 (*)    GFR, Estimated 34 (*)    All other components within normal limits  CBC WITH DIFFERENTIAL/PLATELET - Abnormal; Notable for the following components:   WBC 3.5 (*)    RBC 3.58 (*)    Hemoglobin 9.9 (*)    HCT 29.7 (*)    RDW 16.9 (*)    Neutro Abs 1.5 (*)    All other components within normal limits    EKG None  Radiology DG Abdomen Acute W/Chest  Result Date: 09/18/2020 CLINICAL DATA:  Initial evaluation for acute vomiting. EXAM: DG ABDOMEN ACUTE WITH 1 VIEW CHEST COMPARISON:  Prior radiograph from 08/24/2020. FINDINGS: Transverse heart size within normal limits. Mediastinal silhouette normal. Left-sided Port-A-Cath in place with tip overlying the proximal right atrium. Lungs normally inflated. No focal infiltrate, pulmonary edema or pleural effusion. No pneumothorax. Bowel gas pattern is nonobstructive. Mild gaseous distension of the transverse and descending colon. Prominent stool at the splenic flexure and ascending colon. No small bowel dilatation. No visible free air. 4 mm calcification  overlying the left upper quadrant suspicious for left renal nephrolithiasis. Visualized osseous structures demonstrate no acute finding. Degenerative changes noted about the hips. IMPRESSION: 1. Nonobstructive bowel gas pattern with no radiographic evidence for acute intra-abdominal process. 2. 4 mm calcification overlying the left upper quadrant, suspicious for left renal nephrolithiasis. 3. No active cardiopulmonary disease. Electronically Signed   By: Marland Kitchen  Jeannine Boga M.D.   On: 09/18/2020 20:49    Procedures Procedures   Medications Ordered in ED Medications  ondansetron (ZOFRAN) injection 4 mg (4 mg Intravenous Given 09/18/20 2015)    ED Course  I have reviewed the triage vital signs and the nursing notes.  Pertinent labs & imaging results that were available during my care of the patient were reviewed by me and considered in my medical decision making (see chart for details).    MDM Rules/Calculators/A&P                          Patient with nausea and vomiting.  Improved with some Zofran.  Feels that there is something caught in his chest.  Reviewed recent CT scans.  Had no intrathoracic pathology such as mass at that time.  Sounds as if it could be some abnormality such as esophageal stricture.  With the feeling of the food getting stuck in his throat other cause such as nausea and vomiting from chemotherapy felt less likely.  Potentially could have other cause such as a esophagitis or esophageal candidiasis.  Has established relationship with Dr. Benson Norway.  Will have patient follow-up.  Labs are abnormal but appear to be stable from prior.  Patient is eager for discharge home.  Will discharge.  Acute abdominal series was nonobstructive pattern. Final Clinical Impression(s) / ED Diagnoses Final diagnoses:  Non-intractable vomiting with nausea, unspecified vomiting type    Rx / DC Orders ED Discharge Orders         Ordered    ondansetron (ZOFRAN) 8 MG tablet  Every 8 hours PRN         09/18/20 2158           Davonna Belling, MD 09/19/20 0004

## 2020-09-18 NOTE — Discharge Instructions (Signed)
With the feeling of the fullness in your esophagus he could have a narrowing of the esophagus.  Follow-up with Dr. Benson Norway.  Try and keep yourself hydrated.  The nausea medicine you have may also help if this is somewhat related to chemotherapy.

## 2020-09-18 NOTE — ED Triage Notes (Addendum)
Pt states about 30-40 minutes after eating this evening he states "I have this ball in the center of my chest that wont go down". Pt states the sx started this morning and has had vomited appx. 10 times. Pt describes the sensation as something is stuck in the center of his chest and it wont go down, so he vomits it up after a while. Denies SOB.   Hx of colon cancer and currently receiving chemo.

## 2020-09-20 ENCOUNTER — Encounter: Payer: Self-pay | Admitting: *Deleted

## 2020-09-20 NOTE — Progress Notes (Signed)
Faxed completed STD form with recent office note to [facility] [phone]. Copy to HIM and will provide copy to patient at next visit.

## 2020-09-26 ENCOUNTER — Other Ambulatory Visit: Payer: Self-pay | Admitting: Oncology

## 2020-09-27 ENCOUNTER — Inpatient Hospital Stay: Payer: 59

## 2020-09-27 ENCOUNTER — Telehealth: Payer: Self-pay | Admitting: *Deleted

## 2020-09-27 ENCOUNTER — Inpatient Hospital Stay: Payer: 59 | Attending: Oncology

## 2020-09-27 ENCOUNTER — Other Ambulatory Visit: Payer: Self-pay

## 2020-09-27 ENCOUNTER — Inpatient Hospital Stay (HOSPITAL_BASED_OUTPATIENT_CLINIC_OR_DEPARTMENT_OTHER): Payer: 59 | Admitting: Oncology

## 2020-09-27 VITALS — BP 158/83 | HR 89 | Temp 97.5°F | Resp 20 | Ht 73.0 in | Wt 209.0 lb

## 2020-09-27 DIAGNOSIS — D500 Iron deficiency anemia secondary to blood loss (chronic): Secondary | ICD-10-CM | POA: Insufficient documentation

## 2020-09-27 DIAGNOSIS — E1140 Type 2 diabetes mellitus with diabetic neuropathy, unspecified: Secondary | ICD-10-CM | POA: Diagnosis not present

## 2020-09-27 DIAGNOSIS — Z5111 Encounter for antineoplastic chemotherapy: Secondary | ICD-10-CM | POA: Diagnosis not present

## 2020-09-27 DIAGNOSIS — Z9221 Personal history of antineoplastic chemotherapy: Secondary | ICD-10-CM | POA: Insufficient documentation

## 2020-09-27 DIAGNOSIS — K922 Gastrointestinal hemorrhage, unspecified: Secondary | ICD-10-CM | POA: Insufficient documentation

## 2020-09-27 DIAGNOSIS — Z8719 Personal history of other diseases of the digestive system: Secondary | ICD-10-CM | POA: Diagnosis not present

## 2020-09-27 DIAGNOSIS — C185 Malignant neoplasm of splenic flexure: Secondary | ICD-10-CM

## 2020-09-27 DIAGNOSIS — R112 Nausea with vomiting, unspecified: Secondary | ICD-10-CM | POA: Diagnosis not present

## 2020-09-27 DIAGNOSIS — C186 Malignant neoplasm of descending colon: Secondary | ICD-10-CM | POA: Diagnosis present

## 2020-09-27 DIAGNOSIS — N289 Disorder of kidney and ureter, unspecified: Secondary | ICD-10-CM | POA: Insufficient documentation

## 2020-09-27 DIAGNOSIS — I110 Hypertensive heart disease with heart failure: Secondary | ICD-10-CM | POA: Diagnosis not present

## 2020-09-27 DIAGNOSIS — Z452 Encounter for adjustment and management of vascular access device: Secondary | ICD-10-CM | POA: Insufficient documentation

## 2020-09-27 DIAGNOSIS — Z79899 Other long term (current) drug therapy: Secondary | ICD-10-CM | POA: Insufficient documentation

## 2020-09-27 DIAGNOSIS — Z794 Long term (current) use of insulin: Secondary | ICD-10-CM | POA: Diagnosis not present

## 2020-09-27 LAB — CMP (CANCER CENTER ONLY)
ALT: 8 U/L (ref 0–44)
AST: 14 U/L — ABNORMAL LOW (ref 15–41)
Albumin: 3.5 g/dL (ref 3.5–5.0)
Alkaline Phosphatase: 113 U/L (ref 38–126)
Anion gap: 9 (ref 5–15)
BUN: 27 mg/dL — ABNORMAL HIGH (ref 6–20)
CO2: 27 mmol/L (ref 22–32)
Calcium: 8.7 mg/dL — ABNORMAL LOW (ref 8.9–10.3)
Chloride: 100 mmol/L (ref 98–111)
Creatinine: 2.1 mg/dL — ABNORMAL HIGH (ref 0.61–1.24)
GFR, Estimated: 36 mL/min — ABNORMAL LOW (ref >60)
Glucose, Bld: 245 mg/dL — ABNORMAL HIGH (ref 70–99)
Potassium: 3.9 mmol/L (ref 3.5–5.1)
Sodium: 136 mmol/L (ref 135–145)
Total Bilirubin: 0.7 mg/dL (ref 0.3–1.2)
Total Protein: 6.8 g/dL (ref 6.5–8.1)

## 2020-09-27 LAB — CBC WITH DIFFERENTIAL (CANCER CENTER ONLY)
Abs Immature Granulocytes: 0.02 10*3/uL (ref 0.00–0.07)
Basophils Absolute: 0.1 10*3/uL (ref 0.0–0.1)
Basophils Relative: 1 %
Eosinophils Absolute: 0.1 10*3/uL (ref 0.0–0.5)
Eosinophils Relative: 1 %
HCT: 26.4 % — ABNORMAL LOW (ref 39.0–52.0)
Hemoglobin: 8.6 g/dL — ABNORMAL LOW (ref 13.0–17.0)
Immature Granulocytes: 0 %
Lymphocytes Relative: 26 %
Lymphs Abs: 1.5 10*3/uL (ref 0.7–4.0)
MCH: 27.7 pg (ref 26.0–34.0)
MCHC: 32.6 g/dL (ref 30.0–36.0)
MCV: 84.9 fL (ref 80.0–100.0)
Monocytes Absolute: 0.8 10*3/uL (ref 0.1–1.0)
Monocytes Relative: 13 %
Neutro Abs: 3.4 10*3/uL (ref 1.7–7.7)
Neutrophils Relative %: 59 %
Platelet Count: 184 10*3/uL (ref 150–400)
RBC: 3.11 MIL/uL — ABNORMAL LOW (ref 4.22–5.81)
RDW: 18.1 % — ABNORMAL HIGH (ref 11.5–15.5)
WBC Count: 5.8 10*3/uL (ref 4.0–10.5)
nRBC: 0 % (ref 0.0–0.2)

## 2020-09-27 LAB — CEA (IN HOUSE-CHCC): CEA (CHCC-In House): 11.44 ng/mL — ABNORMAL HIGH (ref 0.00–5.00)

## 2020-09-27 LAB — CEA (ACCESS): CEA (CHCC): 12.5 ng/mL — ABNORMAL HIGH (ref 0.00–5.00)

## 2020-09-27 MED ORDER — SODIUM CHLORIDE 0.9 % IV SOLN
150.0000 mg | Freq: Once | INTRAVENOUS | Status: AC
  Administered 2020-09-27: 150 mg via INTRAVENOUS
  Filled 2020-09-27: qty 150

## 2020-09-27 MED ORDER — OXALIPLATIN CHEMO INJECTION 100 MG/20ML
65.0000 mg/m2 | Freq: Once | INTRAVENOUS | Status: AC
  Administered 2020-09-27: 145 mg via INTRAVENOUS
  Filled 2020-09-27: qty 10

## 2020-09-27 MED ORDER — DEXTROSE 5 % IV SOLN
Freq: Once | INTRAVENOUS | Status: AC
  Filled 2020-09-27: qty 250

## 2020-09-27 MED ORDER — SODIUM CHLORIDE 0.9% FLUSH
10.0000 mL | INTRAVENOUS | Status: DC | PRN
  Filled 2020-09-27: qty 10

## 2020-09-27 MED ORDER — PALONOSETRON HCL INJECTION 0.25 MG/5ML
0.2500 mg | Freq: Once | INTRAVENOUS | Status: AC
  Administered 2020-09-27: 0.25 mg via INTRAVENOUS
  Filled 2020-09-27: qty 5

## 2020-09-27 MED ORDER — DEXTROSE 5 % IV SOLN
Freq: Once | INTRAVENOUS | Status: AC
Start: 2020-09-27 — End: 2020-09-27
  Filled 2020-09-27: qty 250

## 2020-09-27 MED ORDER — SODIUM CHLORIDE 0.9 % IV SOLN
2400.0000 mg/m2 | INTRAVENOUS | Status: DC
  Administered 2020-09-27: 5400 mg via INTRAVENOUS
  Filled 2020-09-27: qty 108

## 2020-09-27 MED ORDER — LEUCOVORIN CALCIUM INJECTION 350 MG
400.0000 mg/m2 | Freq: Once | INTRAVENOUS | Status: AC
  Administered 2020-09-27: 904 mg via INTRAVENOUS
  Filled 2020-09-27: qty 45.2

## 2020-09-27 MED ORDER — FLUOROURACIL CHEMO INJECTION 2.5 GM/50ML
400.0000 mg/m2 | Freq: Once | INTRAVENOUS | Status: AC
  Administered 2020-09-27: 900 mg via INTRAVENOUS
  Filled 2020-09-27: qty 18

## 2020-09-27 MED ORDER — DEXAMETHASONE SODIUM PHOSPHATE 100 MG/10ML IJ SOLN
10.0000 mg | Freq: Once | INTRAMUSCULAR | Status: AC
  Administered 2020-09-27: 10 mg via INTRAVENOUS
  Filled 2020-09-27: qty 10

## 2020-09-27 MED ORDER — HEPARIN SOD (PORK) LOCK FLUSH 100 UNIT/ML IV SOLN
500.0000 [IU] | Freq: Once | INTRAVENOUS | Status: DC | PRN
  Filled 2020-09-27: qty 5

## 2020-09-27 NOTE — Telephone Encounter (Signed)
-----   Message from Ladell Pier, MD sent at 09/27/2020 12:57 PM EDT ----- Please call patient, cancer marker is better, follow-up as scheduled

## 2020-09-27 NOTE — Patient Instructions (Signed)
[facility]  Discharge Instructions: Thank you for choosing [facility] to provide your oncology and hematology care.   If you have a lab appointment with the Cancer Center, please go directly to the Cancer Center and check in at the registration area.   Wear comfortable clothing and clothing appropriate for easy access to any Portacath or PICC line.   We strive to give you quality time with your provider. You may need to reschedule your appointment if you arrive late (15 or more minutes).  Arriving late affects you and other patients whose appointments are after yours.  Also, if you miss three or more appointments without notifying the office, you may be dismissed from the clinic at the provider's discretion.      For prescription refill requests, have your pharmacy contact our office and allow 72 hours for refills to be completed.    Today you received the following chemotherapy and/or immunotherapy agents Oxaliplatin, leucovorin, fluorouracil     To help prevent nausea and vomiting after your treatment, we encourage you to take your nausea medication as directed.  BELOW ARE SYMPTOMS THAT SHOULD BE REPORTED IMMEDIATELY: . *FEVER GREATER THAN 100.4 F (38 C) OR HIGHER . *CHILLS OR SWEATING . *NAUSEA AND VOMITING THAT IS NOT CONTROLLED WITH YOUR NAUSEA MEDICATION . *UNUSUAL SHORTNESS OF BREATH . *UNUSUAL BRUISING OR BLEEDING . *URINARY PROBLEMS (pain or burning when urinating, or frequent urination) . *BOWEL PROBLEMS (unusual diarrhea, constipation, pain near the anus) . TENDERNESS IN MOUTH AND THROAT WITH OR WITHOUT PRESENCE OF ULCERS (sore throat, sores in mouth, or a toothache) . UNUSUAL RASH, SWELLING OR PAIN  . UNUSUAL VAGINAL DISCHARGE OR ITCHING   Items with * indicate a potential emergency and should be followed up as soon as possible or go to the Emergency Department if any problems should occur.  Please show the CHEMOTHERAPY ALERT CARD  or IMMUNOTHERAPY ALERT CARD at check-in to the Emergency Department and triage nurse.  Should you have questions after your visit or need to cancel or reschedule your appointment, please contact [facility]  Dept: [phone]  and follow the prompts.  Office hours are 8:00 a.m. to 4:30 p.m. Monday - Friday. Please note that voicemails left after 4:00 p.m. may not be returned until the following business day.  We are closed weekends and major holidays. You have access to a nurse at all times for urgent questions. Please call the main number to the clinic Dept: [phone] and follow the prompts.   For any non-urgent questions, you may also contact your provider using MyChart. We now offer e-Visits for anyone 18 and older to request care online for non-urgent symptoms. For details visit [email or web address].   Also download the MyChart app! Go to the app store, search "MyChart", open the app, select [facility], and log in with your MyChart username and password.  Due to Covid, a mask is required upon entering the hospital/clinic. If you do not have a mask, one will be given to you upon arrival. For doctor visits, patients may have 1 support person aged 18 or older with them. For treatment visits, patients cannot have anyone with them due to current Covid guidelines and our immunocompromised population.   Oxaliplatin Injection What is this medicine? OXALIPLATIN (ox AL i PLA tin) is a chemotherapy drug. It targets fast dividing cells, like cancer cells, and causes these cells to die. This medicine is used to treat cancers of   the colon and rectum, and many other cancers. This medicine may be used for other purposes; ask your health care provider or pharmacist if you have questions. COMMON BRAND NAME(S): Eloxatin What should I tell my health care provider before I take this medicine? They need to know if you have any of these conditions:  heart disease  history of  irregular heartbeat  liver disease  low blood counts, like white cells, platelets, or red blood cells  lung or breathing disease, like asthma  take medicines that treat or prevent blood clots  tingling of the fingers or toes, or other nerve disorder  an unusual or allergic reaction to oxaliplatin, other chemotherapy, other medicines, foods, dyes, or preservatives  pregnant or trying to get pregnant  breast-feeding How should I use this medicine? This drug is given as an infusion into a vein. It is administered in a hospital or clinic by a specially trained health care professional. Talk to your pediatrician regarding the use of this medicine in children. Special care may be needed. Overdosage: If you think you have taken too much of this medicine contact a poison control center or emergency room at once. NOTE: This medicine is only for you. Do not share this medicine with others. What if I miss a dose? It is important not to miss a dose. Call your doctor or health care professional if you are unable to keep an appointment. What may interact with this medicine? Do not take this medicine with any of the following medications:  cisapride  dronedarone  pimozide  thioridazine This medicine may also interact with the following medications:  aspirin and aspirin-like medicines  certain medicines that treat or prevent blood clots like warfarin, apixaban, dabigatran, and rivaroxaban  cisplatin  cyclosporine  diuretics  medicines for infection like acyclovir, adefovir, amphotericin B, bacitracin, cidofovir, foscarnet, ganciclovir, gentamicin, pentamidine, vancomycin  NSAIDs, medicines for pain and inflammation, like ibuprofen or naproxen  other medicines that prolong the QT interval (an abnormal heart rhythm)  pamidronate  zoledronic acid This list may not describe all possible interactions. Give your health care provider a list of all the medicines, herbs,  non-prescription drugs, or dietary supplements you use. Also tell them if you smoke, drink alcohol, or use illegal drugs. Some items may interact with your medicine. What should I watch for while using this medicine? Your condition will be monitored carefully while you are receiving this medicine. You may need blood work done while you are taking this medicine. This medicine may make you feel generally unwell. This is not uncommon as chemotherapy can affect healthy cells as well as cancer cells. Report any side effects. Continue your course of treatment even though you feel ill unless your healthcare professional tells you to stop. This medicine can make you more sensitive to cold. Do not drink cold drinks or use ice. Cover exposed skin before coming in contact with cold temperatures or cold objects. When out in cold weather wear warm clothing and cover your mouth and nose to warm the air that goes into your lungs. Tell your doctor if you get sensitive to the cold. Do not become pregnant while taking this medicine or for 9 months after stopping it. Women should inform their health care professional if they wish to become pregnant or think they might be pregnant. Men should not father a child while taking this medicine and for 6 months after stopping it. There is potential for serious side effects to an unborn child. Talk to   your health care professional for more information. Do not breast-feed a child while taking this medicine or for 3 months after stopping it. This medicine has caused ovarian failure in some women. This medicine may make it more difficult to get pregnant. Talk to your health care professional if you are concerned about your fertility. This medicine has caused decreased sperm counts in some men. This may make it more difficult to father a child. Talk to your health care professional if you are concerned about your fertility. This medicine may increase your risk of getting an infection.  Call your health care professional for advice if you get a fever, chills, or sore throat, or other symptoms of a cold or flu. Do not treat yourself. Try to avoid being around people who are sick. Avoid taking medicines that contain aspirin, acetaminophen, ibuprofen, naproxen, or ketoprofen unless instructed by your health care professional. These medicines may hide a fever. Be careful brushing or flossing your teeth or using a toothpick because you may get an infection or bleed more easily. If you have any dental work done, tell your dentist you are receiving this medicine. What side effects may I notice from receiving this medicine? Side effects that you should report to your doctor or health care professional as soon as possible:  allergic reactions like skin rash, itching or hives, swelling of the face, lips, or tongue  breathing problems  cough  low blood counts - this medicine may decrease the number of white blood cells, red blood cells, and platelets. You may be at increased risk for infections and bleeding  nausea, vomiting  pain, redness, or irritation at site where injected  pain, tingling, numbness in the hands or feet  signs and symptoms of bleeding such as bloody or black, tarry stools; red or dark brown urine; spitting up blood or brown material that looks like coffee grounds; red spots on the skin; unusual bruising or bleeding from the eyes, gums, or nose  signs and symptoms of a dangerous change in heartbeat or heart rhythm like chest pain; dizziness; fast, irregular heartbeat; palpitations; feeling faint or lightheaded; falls  signs and symptoms of infection like fever; chills; cough; sore throat; pain or trouble passing urine  signs and symptoms of liver injury like dark yellow or brown urine; general ill feeling or flu-like symptoms; light-colored stools; loss of appetite; nausea; right upper belly pain; unusually weak or tired; yellowing of the eyes or skin  signs and  symptoms of low red blood cells or anemia such as unusually weak or tired; feeling faint or lightheaded; falls  signs and symptoms of muscle injury like dark urine; trouble passing urine or change in the amount of urine; unusually weak or tired; muscle pain; back pain Side effects that usually do not require medical attention (report to your doctor or health care professional if they continue or are bothersome):  changes in taste  diarrhea  gas  hair loss  loss of appetite  mouth sores This list may not describe all possible side effects. Call your doctor for medical advice about side effects. You may report side effects to FDA at 1-800-FDA-1088. Where should I keep my medicine? This drug is given in a hospital or clinic and will not be stored at home. NOTE: This sheet is a summary. It may not cover all possible information. If you have questions about this medicine, talk to your doctor, pharmacist, or health care provider.  2021 Elsevier/Gold Standard (2018-09-25 12:20:35)  Leucovorin injection   What is this medicine? LEUCOVORIN (loo koe VOR in) is used to prevent or treat the harmful effects of some medicines. This medicine is used to treat anemia caused by a low amount of folic acid in the body. It is also used with 5-fluorouracil (5-FU) to treat colon cancer. This medicine may be used for other purposes; ask your health care provider or pharmacist if you have questions. What should I tell my health care provider before I take this medicine? They need to know if you have any of these conditions:  anemia from low levels of vitamin B-12 in the blood  an unusual or allergic reaction to leucovorin, folic acid, other medicines, foods, dyes, or preservatives  pregnant or trying to get pregnant  breast-feeding How should I use this medicine? This medicine is for injection into a muscle or into a vein. It is given by a health care professional in a hospital or clinic setting. Talk to  your pediatrician regarding the use of this medicine in children. Special care may be needed. Overdosage: If you think you have taken too much of this medicine contact a poison control center or emergency room at once. NOTE: This medicine is only for you. Do not share this medicine with others. What if I miss a dose? This does not apply. What may interact with this medicine?  capecitabine  fluorouracil  phenobarbital  phenytoin  primidone  trimethoprim-sulfamethoxazole This list may not describe all possible interactions. Give your health care provider a list of all the medicines, herbs, non-prescription drugs, or dietary supplements you use. Also tell them if you smoke, drink alcohol, or use illegal drugs. Some items may interact with your medicine. What should I watch for while using this medicine? Your condition will be monitored carefully while you are receiving this medicine. This medicine may increase the side effects of 5-fluorouracil, 5-FU. Tell your doctor or health care professional if you have diarrhea or mouth sores that do not get better or that get worse. What side effects may I notice from receiving this medicine? Side effects that you should report to your doctor or health care professional as soon as possible:  allergic reactions like skin rash, itching or hives, swelling of the face, lips, or tongue  breathing problems  fever, infection  mouth sores  unusual bleeding or bruising  unusually weak or tired Side effects that usually do not require medical attention (report to your doctor or health care professional if they continue or are bothersome):  constipation or diarrhea  loss of appetite  nausea, vomiting This list may not describe all possible side effects. Call your doctor for medical advice about side effects. You may report side effects to FDA at 1-800-FDA-1088. Where should I keep my medicine? This drug is given in a hospital or clinic and will  not be stored at home. NOTE: This sheet is a summary. It may not cover all possible information. If you have questions about this medicine, talk to your doctor, pharmacist, or health care provider.  2021 Elsevier/Gold Standard (2007-11-12 16:50:29)  Fluorouracil, 5-FU injection What is this medicine? FLUOROURACIL, 5-FU (flure oh YOOR a sil) is a chemotherapy drug. It slows the growth of cancer cells. This medicine is used to treat many types of cancer like breast cancer, colon or rectal cancer, pancreatic cancer, and stomach cancer. This medicine may be used for other purposes; ask your health care provider or pharmacist if you have questions. COMMON BRAND NAME(S): Adrucil What should I tell my   health care provider before I take this medicine? They need to know if you have any of these conditions:  blood disorders  dihydropyrimidine dehydrogenase (DPD) deficiency  infection (especially a virus infection such as chickenpox, cold sores, or herpes)  kidney disease  liver disease  malnourished, poor nutrition  recent or ongoing radiation therapy  an unusual or allergic reaction to fluorouracil, other chemotherapy, other medicines, foods, dyes, or preservatives  pregnant or trying to get pregnant  breast-feeding How should I use this medicine? This drug is given as an infusion or injection into a vein. It is administered in a hospital or clinic by a specially trained health care professional. Talk to your pediatrician regarding the use of this medicine in children. Special care may be needed. Overdosage: If you think you have taken too much of this medicine contact a poison control center or emergency room at once. NOTE: This medicine is only for you. Do not share this medicine with others. What if I miss a dose? It is important not to miss your dose. Call your doctor or health care professional if you are unable to keep an appointment. What may interact with this medicine? Do not  take this medicine with any of the following medications:  live virus vaccines This medicine may also interact with the following medications:  medicines that treat or prevent blood clots like warfarin, enoxaparin, and dalteparin This list may not describe all possible interactions. Give your health care provider a list of all the medicines, herbs, non-prescription drugs, or dietary supplements you use. Also tell them if you smoke, drink alcohol, or use illegal drugs. Some items may interact with your medicine. What should I watch for while using this medicine? Visit your doctor for checks on your progress. This drug may make you feel generally unwell. This is not uncommon, as chemotherapy can affect healthy cells as well as cancer cells. Report any side effects. Continue your course of treatment even though you feel ill unless your doctor tells you to stop. In some cases, you may be given additional medicines to help with side effects. Follow all directions for their use. Call your doctor or health care professional for advice if you get a fever, chills or sore throat, or other symptoms of a cold or flu. Do not treat yourself. This drug decreases your body's ability to fight infections. Try to avoid being around people who are sick. This medicine may increase your risk to bruise or bleed. Call your doctor or health care professional if you notice any unusual bleeding. Be careful brushing and flossing your teeth or using a toothpick because you may get an infection or bleed more easily. If you have any dental work done, tell your dentist you are receiving this medicine. Avoid taking products that contain aspirin, acetaminophen, ibuprofen, naproxen, or ketoprofen unless instructed by your doctor. These medicines may hide a fever. Do not become pregnant while taking this medicine. Women should inform their doctor if they wish to become pregnant or think they might be pregnant. There is a potential for  serious side effects to an unborn child. Talk to your health care professional or pharmacist for more information. Do not breast-feed an infant while taking this medicine. Men should inform their doctor if they wish to father a child. This medicine may lower sperm counts. Do not treat diarrhea with over the counter products. Contact your doctor if you have diarrhea that lasts more than 2 days or if it is severe and   watery. This medicine can make you more sensitive to the sun. Keep out of the sun. If you cannot avoid being in the sun, wear protective clothing and use sunscreen. Do not use sun lamps or tanning beds/booths. What side effects may I notice from receiving this medicine? Side effects that you should report to your doctor or health care professional as soon as possible:  allergic reactions like skin rash, itching or hives, swelling of the face, lips, or tongue  low blood counts - this medicine may decrease the number of white blood cells, red blood cells and platelets. You may be at increased risk for infections and bleeding.  signs of infection - fever or chills, cough, sore throat, pain or difficulty passing urine  signs of decreased platelets or bleeding - bruising, pinpoint red spots on the skin, black, tarry stools, blood in the urine  signs of decreased red blood cells - unusually weak or tired, fainting spells, lightheadedness  breathing problems  changes in vision  chest pain  mouth sores  nausea and vomiting  pain, swelling, redness at site where injected  pain, tingling, numbness in the hands or feet  redness, swelling, or sores on hands or feet  stomach pain  unusual bleeding Side effects that usually do not require medical attention (report to your doctor or health care professional if they continue or are bothersome):  changes in finger or toe nails  diarrhea  dry or itchy skin  hair loss  headache  loss of appetite  sensitivity of eyes to the  light  stomach upset  unusually teary eyes This list may not describe all possible side effects. Call your doctor for medical advice about side effects. You may report side effects to FDA at 1-800-FDA-1088. Where should I keep my medicine? This drug is given in a hospital or clinic and will not be stored at home. NOTE: This sheet is a summary. It may not cover all possible information. If you have questions about this medicine, talk to your doctor, pharmacist, or health care provider.  2021 Elsevier/Gold Standard (2019-04-08 15:00:03)     

## 2020-09-27 NOTE — Progress Notes (Signed)
Per Dr Benay Spice ok to treat with SCR 2.1

## 2020-09-27 NOTE — Telephone Encounter (Signed)
Per Dr.Sherrill, called pt to make aware of message below. Advised to call if there were any other concerns

## 2020-09-27 NOTE — Progress Notes (Signed)
Jeffrey Young OFFICE PROGRESS NOTE   Diagnosis: Colon cancer  INTERVAL HISTORY:   [name] completed cycle 2 FOLFOX on 09/13/2020.  He reports cold sensitivity until day 6.  No neuropathy symptoms at present.  No diarrhea.  He has persistent intermittent nausea.  He has nausea when he is coughing.  He has a cough that predates the diagnosis of colon cancer.  He coughs mainly when he is in his home.  He noticed he has very little coughing when he is outside of the home.  No dyspnea. He was seen in the emergency room on 09/18/2020 with vomiting and chest discomfort.  He feels there is a "lump "in his chest after eating.  He was referred to Dr. Benson Young and reports he was scheduled to undergo an endoscopy, but was fearful of undergoing this procedure. Objective:  Vital signs in last 24 hours:  Blood pressure (!) 158/83, pulse 89, temperature (!) 97.5 F (36.4 C), temperature source Oral, resp. rate 20, height $RemoveBe'6\' 1"'OUdkVytVU$  (1.854 m), weight 209 lb (94.8 kg), SpO2 100 %.    HEENT: No thrush or ulcers, mild white coat over the tongue Resp: Lungs clear bilaterally Cardio: Regular rate and rhythm GI: Nontender, no hepatosplenomegaly, no mass Vascular: Trace pitting edema at the right greater than left lower leg and ankle, no erythema or tenderness  Skin: Palms without erythema  Portacath/PICC-without erythema  Lab Results:  Lab Results  Component Value Date   WBC 5.8 09/27/2020   HGB 8.6 (L) 09/27/2020   HCT 26.4 (L) 09/27/2020   MCV 84.9 09/27/2020   PLT 184 09/27/2020   NEUTROABS 3.4 09/27/2020    CMP  Lab Results  Component Value Date   NA 131 (L) 09/18/2020   K 4.0 09/18/2020   CL 95 (L) 09/18/2020   CO2 27 09/18/2020   GLUCOSE 362 (H) 09/18/2020   BUN 40 (H) 09/18/2020   CREATININE 2.20 (H) 09/18/2020   CALCIUM 9.3 09/18/2020   PROT 7.1 09/18/2020   ALBUMIN 3.7 09/18/2020   AST 12 (L) 09/18/2020   ALT 9 09/18/2020   ALKPHOS 134 (H) 09/18/2020   BILITOT 0.8  09/18/2020   GFRNONAA 34 (L) 09/18/2020   GFRAA >60 01/26/2018    Lab Results  Component Value Date   CEA1 27.57 (H) 09/13/2020     Medications: I have reviewed the patient's current medications.   Assessment/Plan: 1.  Descending colon cancer, stage IIIc (T3N2b M0), status post a partial left colectomy 07/30/2020, 9/16 lymph nodes positive, lymphovascular invasion, 1 satellite nodule, negative margins, MSS, no loss of mismatch repair protein expression -History of large polyp in the left side of the colon-referred to [facility] in 05/2018 for procedure canceled secondary to COVID-19 pandemic. Procedure was not rescheduled. -CT chest/abdomen/pelvis with contrast 07/27/2020-3 small pulmonary nodules less than 5 mm favored to be benign, circumferential luminal narrowing of the distal transverse colon concerning for malignancy, no metastatic adenopathy in the mesentery porta hepatis, no for metastasis. -CEA on 07/27/2020 was 17.3; 37 on 08/30/2020; 33 on 09/13/2020 -Colonoscopy performed 07/27/2020 showed a fungating, infiltrative and ulcerated nonobstructing large mass in the proximal descending colon.  Biopsy-adenocarcinoma -Cycle 1 FOLFOX 08/30/2020 -Cycle 2 FOLFOX 09/13/2020, Emend added for delayed nausea -Cycle 3 FOLFOX 09/27/2020 2. Anemia due to GI bleeding,iron deficiency?, Renal insufficiency? 3. New onset acute diastolic CHF March 1194 4. Diabetes mellitus 5. Renal insufficiency 6. Hypertension 7. History of left transmetatarsal amputation 8.History of colon polyps 9.  Neuropathy    Disposition:  Jeffrey Young has completed 2 cycles of FOLFOX.  He continues to have intermittent nausea and vomiting.  This occurs when he is coughing.  I doubt his symptoms are related to colon cancer or chemotherapy.  He has less coughing and nausea when he is outside of his home.  He wonders whether there is an environmental cause for the cough.  It is possible the cough is related to reflux, and  esophageal stricture, or diabetic gastroparesis.  I recommend he follow-up with Dr. Benson Young.  We will follow up on the CEA from today.  He will complete cycle 3 FOLFOX today.  He will return for an office visit and chemotherapy in 2 weeks.  The plan is to proceed with restaging CTs after 1-2 more cycles of chemotherapy if the CEA remains elevated.    Jeffrey Coder, MD  09/27/2020  9:46 AM

## 2020-09-28 ENCOUNTER — Encounter: Payer: Self-pay | Admitting: *Deleted

## 2020-09-28 NOTE — Progress Notes (Signed)
Faxed labs/office note from 09/27/20 to [facility] # [phone] as requested.

## 2020-09-29 ENCOUNTER — Other Ambulatory Visit: Payer: Self-pay

## 2020-09-29 ENCOUNTER — Inpatient Hospital Stay: Payer: 59

## 2020-09-29 VITALS — BP 154/90 | HR 79 | Temp 98.9°F | Resp 18

## 2020-09-29 DIAGNOSIS — Z5111 Encounter for antineoplastic chemotherapy: Secondary | ICD-10-CM | POA: Diagnosis not present

## 2020-09-29 DIAGNOSIS — C185 Malignant neoplasm of splenic flexure: Secondary | ICD-10-CM

## 2020-09-29 MED ORDER — HEPARIN SOD (PORK) LOCK FLUSH 100 UNIT/ML IV SOLN
500.0000 [IU] | Freq: Once | INTRAVENOUS | Status: AC | PRN
  Administered 2020-09-29: 500 [IU]
  Filled 2020-09-29: qty 5

## 2020-09-29 MED ORDER — SODIUM CHLORIDE 0.9% FLUSH
10.0000 mL | INTRAVENOUS | Status: DC | PRN
  Administered 2020-09-29: 10 mL
  Filled 2020-09-29: qty 10

## 2020-10-10 ENCOUNTER — Other Ambulatory Visit: Payer: Self-pay | Admitting: Oncology

## 2020-10-11 ENCOUNTER — Inpatient Hospital Stay: Payer: 59

## 2020-10-11 ENCOUNTER — Other Ambulatory Visit: Payer: Self-pay

## 2020-10-11 ENCOUNTER — Inpatient Hospital Stay (HOSPITAL_BASED_OUTPATIENT_CLINIC_OR_DEPARTMENT_OTHER): Payer: 59 | Admitting: Oncology

## 2020-10-11 VITALS — BP 142/86 | HR 77 | Temp 98.2°F | Resp 18 | Ht 73.0 in | Wt 200.0 lb

## 2020-10-11 DIAGNOSIS — C185 Malignant neoplasm of splenic flexure: Secondary | ICD-10-CM

## 2020-10-11 DIAGNOSIS — Z5111 Encounter for antineoplastic chemotherapy: Secondary | ICD-10-CM | POA: Diagnosis not present

## 2020-10-11 LAB — CMP (CANCER CENTER ONLY)
ALT: 11 U/L (ref 0–44)
AST: 17 U/L (ref 15–41)
Albumin: 3.4 g/dL — ABNORMAL LOW (ref 3.5–5.0)
Alkaline Phosphatase: 117 U/L (ref 38–126)
Anion gap: 8 (ref 5–15)
BUN: 26 mg/dL — ABNORMAL HIGH (ref 6–20)
CO2: 23 mmol/L (ref 22–32)
Calcium: 8.2 mg/dL — ABNORMAL LOW (ref 8.9–10.3)
Chloride: 101 mmol/L (ref 98–111)
Creatinine: 2 mg/dL — ABNORMAL HIGH (ref 0.61–1.24)
GFR, Estimated: 38 mL/min — ABNORMAL LOW (ref >60)
Glucose, Bld: 232 mg/dL — ABNORMAL HIGH (ref 70–99)
Potassium: 3.3 mmol/L — ABNORMAL LOW (ref 3.5–5.1)
Sodium: 132 mmol/L — ABNORMAL LOW (ref 135–145)
Total Bilirubin: 0.7 mg/dL (ref 0.3–1.2)
Total Protein: 6.6 g/dL (ref 6.5–8.1)

## 2020-10-11 LAB — CBC WITH DIFFERENTIAL (CANCER CENTER ONLY)
Abs Immature Granulocytes: 0.02 10*3/uL (ref 0.00–0.07)
Basophils Absolute: 0 10*3/uL (ref 0.0–0.1)
Basophils Relative: 1 %
Eosinophils Absolute: 0.2 10*3/uL (ref 0.0–0.5)
Eosinophils Relative: 4 %
HCT: 25.7 % — ABNORMAL LOW (ref 39.0–52.0)
Hemoglobin: 8.6 g/dL — ABNORMAL LOW (ref 13.0–17.0)
Immature Granulocytes: 1 %
Lymphocytes Relative: 37 %
Lymphs Abs: 1.6 10*3/uL (ref 0.7–4.0)
MCH: 29.2 pg (ref 26.0–34.0)
MCHC: 33.5 g/dL (ref 30.0–36.0)
MCV: 87.1 fL (ref 80.0–100.0)
Monocytes Absolute: 0.6 10*3/uL (ref 0.1–1.0)
Monocytes Relative: 13 %
Neutro Abs: 1.9 10*3/uL (ref 1.7–7.7)
Neutrophils Relative %: 44 %
Platelet Count: 195 10*3/uL (ref 150–400)
RBC: 2.95 MIL/uL — ABNORMAL LOW (ref 4.22–5.81)
RDW: 20.3 % — ABNORMAL HIGH (ref 11.5–15.5)
WBC Count: 4.3 10*3/uL (ref 4.0–10.5)
nRBC: 0 % (ref 0.0–0.2)

## 2020-10-11 LAB — CEA (ACCESS): CEA (CHCC): 10.5 ng/mL — ABNORMAL HIGH (ref 0.00–5.00)

## 2020-10-11 MED ORDER — PALONOSETRON HCL INJECTION 0.25 MG/5ML
0.2500 mg | Freq: Once | INTRAVENOUS | Status: AC
  Administered 2020-10-11: 0.25 mg via INTRAVENOUS
  Filled 2020-10-11: qty 5

## 2020-10-11 MED ORDER — LEUCOVORIN CALCIUM INJECTION 350 MG
400.0000 mg/m2 | Freq: Once | INTRAVENOUS | Status: AC
  Administered 2020-10-11: 864 mg via INTRAVENOUS
  Filled 2020-10-11: qty 43.2

## 2020-10-11 MED ORDER — OXALIPLATIN CHEMO INJECTION 100 MG/20ML
65.0000 mg/m2 | Freq: Once | INTRAVENOUS | Status: AC
  Administered 2020-10-11: 140 mg via INTRAVENOUS
  Filled 2020-10-11: qty 28

## 2020-10-11 MED ORDER — SODIUM CHLORIDE 0.9 % IV SOLN
10.0000 mg | Freq: Once | INTRAVENOUS | Status: AC
  Administered 2020-10-11: 10 mg via INTRAVENOUS
  Filled 2020-10-11: qty 1

## 2020-10-11 MED ORDER — SODIUM CHLORIDE 0.9 % IV SOLN
2400.0000 mg/m2 | INTRAVENOUS | Status: DC
  Administered 2020-10-11: 5200 mg via INTRAVENOUS
  Filled 2020-10-11: qty 104

## 2020-10-11 MED ORDER — SODIUM CHLORIDE 0.9% FLUSH
10.0000 mL | INTRAVENOUS | Status: DC | PRN
  Administered 2020-10-11: 10 mL
  Filled 2020-10-11: qty 10

## 2020-10-11 MED ORDER — PANTOPRAZOLE SODIUM 40 MG PO TBEC: 40.0000 mg | DELAYED_RELEASE_TABLET | Freq: Two times a day (BID) | ORAL | 0 refills | Status: DC

## 2020-10-11 MED ORDER — CARVEDILOL 3.125 MG PO TABS: 3.1250 mg | ORAL_TABLET | Freq: Two times a day (BID) | ORAL | 0 refills | Status: DC

## 2020-10-11 MED ORDER — DEXTROSE 5 % IV SOLN
Freq: Once | INTRAVENOUS | Status: AC
  Filled 2020-10-11: qty 250

## 2020-10-11 MED ORDER — FLUOROURACIL CHEMO INJECTION 2.5 GM/50ML
400.0000 mg/m2 | Freq: Once | INTRAVENOUS | Status: AC
  Administered 2020-10-11: 850 mg via INTRAVENOUS
  Filled 2020-10-11: qty 17

## 2020-10-11 MED ORDER — SODIUM CHLORIDE 0.9 % IV SOLN
150.0000 mg | Freq: Once | INTRAVENOUS | Status: AC
Start: 2020-10-11 — End: 2020-10-11
  Administered 2020-10-11: 150 mg via INTRAVENOUS
  Filled 2020-10-11: qty 5

## 2020-10-11 MED ORDER — PROCHLORPERAZINE MALEATE 10 MG PO TABS: 10.0000 mg | ORAL_TABLET | Freq: Four times a day (QID) | ORAL | 1 refills | Status: DC | PRN

## 2020-10-11 NOTE — Addendum Note (Signed)
Addended by: Kelli Hope on: 10/11/2020 04:31 PM   Modules accepted: Orders

## 2020-10-11 NOTE — Patient Instructions (Signed)
[facility]  Discharge Instructions: Thank you for choosing [facility] to provide your oncology and hematology care.   If you have a lab appointment with the Cancer Center, please go directly to the Cancer Center and check in at the registration area.   Wear comfortable clothing and clothing appropriate for easy access to any Portacath or PICC line.   We strive to give you quality time with your provider. You may need to reschedule your appointment if you arrive late (15 or more minutes).  Arriving late affects you and other patients whose appointments are after yours.  Also, if you miss three or more appointments without notifying the office, you may be dismissed from the clinic at the provider's discretion.      For prescription refill requests, have your pharmacy contact our office and allow 72 hours for refills to be completed.    Today you received the following chemotherapy and/or immunotherapy agents Oxaliplatin, leucovorin, fluorouracil     To help prevent nausea and vomiting after your treatment, we encourage you to take your nausea medication as directed.  BELOW ARE SYMPTOMS THAT SHOULD BE REPORTED IMMEDIATELY: . *FEVER GREATER THAN 100.4 F (38 C) OR HIGHER . *CHILLS OR SWEATING . *NAUSEA AND VOMITING THAT IS NOT CONTROLLED WITH YOUR NAUSEA MEDICATION . *UNUSUAL SHORTNESS OF BREATH . *UNUSUAL BRUISING OR BLEEDING . *URINARY PROBLEMS (pain or burning when urinating, or frequent urination) . *BOWEL PROBLEMS (unusual diarrhea, constipation, pain near the anus) . TENDERNESS IN MOUTH AND THROAT WITH OR WITHOUT PRESENCE OF ULCERS (sore throat, sores in mouth, or a toothache) . UNUSUAL RASH, SWELLING OR PAIN  . UNUSUAL VAGINAL DISCHARGE OR ITCHING   Items with * indicate a potential emergency and should be followed up as soon as possible or go to the Emergency Department if any problems should occur.  Please show the CHEMOTHERAPY ALERT CARD  or IMMUNOTHERAPY ALERT CARD at check-in to the Emergency Department and triage nurse.  Should you have questions after your visit or need to cancel or reschedule your appointment, please contact [facility]  Dept: [phone]  and follow the prompts.  Office hours are 8:00 a.m. to 4:30 p.m. Monday - Friday. Please note that voicemails left after 4:00 p.m. may not be returned until the following business day.  We are closed weekends and major holidays. You have access to a nurse at all times for urgent questions. Please call the main number to the clinic Dept: [phone] and follow the prompts.   For any non-urgent questions, you may also contact your provider using MyChart. We now offer e-Visits for anyone 18 and older to request care online for non-urgent symptoms. For details visit [email or web address].   Also download the MyChart app! Go to the app store, search "MyChart", open the app, select [facility], and log in with your MyChart username and password.  Due to Covid, a mask is required upon entering the hospital/clinic. If you do not have a mask, one will be given to you upon arrival. For doctor visits, patients may have 1 support person aged 18 or older with them. For treatment visits, patients cannot have anyone with them due to current Covid guidelines and our immunocompromised population.   Oxaliplatin Injection What is this medicine? OXALIPLATIN (ox AL i PLA tin) is a chemotherapy drug. It targets fast dividing cells, like cancer cells, and causes these cells to die. This medicine is used to treat cancers of   the colon and rectum, and many other cancers. This medicine may be used for other purposes; ask your health care provider or pharmacist if you have questions. COMMON BRAND NAME(S): Eloxatin What should I tell my health care provider before I take this medicine? They need to know if you have any of these conditions:  heart disease  history of  irregular heartbeat  liver disease  low blood counts, like white cells, platelets, or red blood cells  lung or breathing disease, like asthma  take medicines that treat or prevent blood clots  tingling of the fingers or toes, or other nerve disorder  an unusual or allergic reaction to oxaliplatin, other chemotherapy, other medicines, foods, dyes, or preservatives  pregnant or trying to get pregnant  breast-feeding How should I use this medicine? This drug is given as an infusion into a vein. It is administered in a hospital or clinic by a specially trained health care professional. Talk to your pediatrician regarding the use of this medicine in children. Special care may be needed. Overdosage: If you think you have taken too much of this medicine contact a poison control center or emergency room at once. NOTE: This medicine is only for you. Do not share this medicine with others. What if I miss a dose? It is important not to miss a dose. Call your doctor or health care professional if you are unable to keep an appointment. What may interact with this medicine? Do not take this medicine with any of the following medications:  cisapride  dronedarone  pimozide  thioridazine This medicine may also interact with the following medications:  aspirin and aspirin-like medicines  certain medicines that treat or prevent blood clots like warfarin, apixaban, dabigatran, and rivaroxaban  cisplatin  cyclosporine  diuretics  medicines for infection like acyclovir, adefovir, amphotericin B, bacitracin, cidofovir, foscarnet, ganciclovir, gentamicin, pentamidine, vancomycin  NSAIDs, medicines for pain and inflammation, like ibuprofen or naproxen  other medicines that prolong the QT interval (an abnormal heart rhythm)  pamidronate  zoledronic acid This list may not describe all possible interactions. Give your health care provider a list of all the medicines, herbs,  non-prescription drugs, or dietary supplements you use. Also tell them if you smoke, drink alcohol, or use illegal drugs. Some items may interact with your medicine. What should I watch for while using this medicine? Your condition will be monitored carefully while you are receiving this medicine. You may need blood work done while you are taking this medicine. This medicine may make you feel generally unwell. This is not uncommon as chemotherapy can affect healthy cells as well as cancer cells. Report any side effects. Continue your course of treatment even though you feel ill unless your healthcare professional tells you to stop. This medicine can make you more sensitive to cold. Do not drink cold drinks or use ice. Cover exposed skin before coming in contact with cold temperatures or cold objects. When out in cold weather wear warm clothing and cover your mouth and nose to warm the air that goes into your lungs. Tell your doctor if you get sensitive to the cold. Do not become pregnant while taking this medicine or for 9 months after stopping it. Women should inform their health care professional if they wish to become pregnant or think they might be pregnant. Men should not father a child while taking this medicine and for 6 months after stopping it. There is potential for serious side effects to an unborn child. Talk to   your health care professional for more information. Do not breast-feed a child while taking this medicine or for 3 months after stopping it. This medicine has caused ovarian failure in some women. This medicine may make it more difficult to get pregnant. Talk to your health care professional if you are concerned about your fertility. This medicine has caused decreased sperm counts in some men. This may make it more difficult to father a child. Talk to your health care professional if you are concerned about your fertility. This medicine may increase your risk of getting an infection.  Call your health care professional for advice if you get a fever, chills, or sore throat, or other symptoms of a cold or flu. Do not treat yourself. Try to avoid being around people who are sick. Avoid taking medicines that contain aspirin, acetaminophen, ibuprofen, naproxen, or ketoprofen unless instructed by your health care professional. These medicines may hide a fever. Be careful brushing or flossing your teeth or using a toothpick because you may get an infection or bleed more easily. If you have any dental work done, tell your dentist you are receiving this medicine. What side effects may I notice from receiving this medicine? Side effects that you should report to your doctor or health care professional as soon as possible:  allergic reactions like skin rash, itching or hives, swelling of the face, lips, or tongue  breathing problems  cough  low blood counts - this medicine may decrease the number of white blood cells, red blood cells, and platelets. You may be at increased risk for infections and bleeding  nausea, vomiting  pain, redness, or irritation at site where injected  pain, tingling, numbness in the hands or feet  signs and symptoms of bleeding such as bloody or black, tarry stools; red or dark brown urine; spitting up blood or brown material that looks like coffee grounds; red spots on the skin; unusual bruising or bleeding from the eyes, gums, or nose  signs and symptoms of a dangerous change in heartbeat or heart rhythm like chest pain; dizziness; fast, irregular heartbeat; palpitations; feeling faint or lightheaded; falls  signs and symptoms of infection like fever; chills; cough; sore throat; pain or trouble passing urine  signs and symptoms of liver injury like dark yellow or brown urine; general ill feeling or flu-like symptoms; light-colored stools; loss of appetite; nausea; right upper belly pain; unusually weak or tired; yellowing of the eyes or skin  signs and  symptoms of low red blood cells or anemia such as unusually weak or tired; feeling faint or lightheaded; falls  signs and symptoms of muscle injury like dark urine; trouble passing urine or change in the amount of urine; unusually weak or tired; muscle pain; back pain Side effects that usually do not require medical attention (report to your doctor or health care professional if they continue or are bothersome):  changes in taste  diarrhea  gas  hair loss  loss of appetite  mouth sores This list may not describe all possible side effects. Call your doctor for medical advice about side effects. You may report side effects to FDA at 1-800-FDA-1088. Where should I keep my medicine? This drug is given in a hospital or clinic and will not be stored at home. NOTE: This sheet is a summary. It may not cover all possible information. If you have questions about this medicine, talk to your doctor, pharmacist, or health care provider.  2021 Elsevier/Gold Standard (2018-09-25 12:20:35)  Leucovorin injection   What is this medicine? LEUCOVORIN (loo koe VOR in) is used to prevent or treat the harmful effects of some medicines. This medicine is used to treat anemia caused by a low amount of folic acid in the body. It is also used with 5-fluorouracil (5-FU) to treat colon cancer. This medicine may be used for other purposes; ask your health care provider or pharmacist if you have questions. What should I tell my health care provider before I take this medicine? They need to know if you have any of these conditions:  anemia from low levels of vitamin B-12 in the blood  an unusual or allergic reaction to leucovorin, folic acid, other medicines, foods, dyes, or preservatives  pregnant or trying to get pregnant  breast-feeding How should I use this medicine? This medicine is for injection into a muscle or into a vein. It is given by a health care professional in a hospital or clinic setting. Talk to  your pediatrician regarding the use of this medicine in children. Special care may be needed. Overdosage: If you think you have taken too much of this medicine contact a poison control center or emergency room at once. NOTE: This medicine is only for you. Do not share this medicine with others. What if I miss a dose? This does not apply. What may interact with this medicine?  capecitabine  fluorouracil  phenobarbital  phenytoin  primidone  trimethoprim-sulfamethoxazole This list may not describe all possible interactions. Give your health care provider a list of all the medicines, herbs, non-prescription drugs, or dietary supplements you use. Also tell them if you smoke, drink alcohol, or use illegal drugs. Some items may interact with your medicine. What should I watch for while using this medicine? Your condition will be monitored carefully while you are receiving this medicine. This medicine may increase the side effects of 5-fluorouracil, 5-FU. Tell your doctor or health care professional if you have diarrhea or mouth sores that do not get better or that get worse. What side effects may I notice from receiving this medicine? Side effects that you should report to your doctor or health care professional as soon as possible:  allergic reactions like skin rash, itching or hives, swelling of the face, lips, or tongue  breathing problems  fever, infection  mouth sores  unusual bleeding or bruising  unusually weak or tired Side effects that usually do not require medical attention (report to your doctor or health care professional if they continue or are bothersome):  constipation or diarrhea  loss of appetite  nausea, vomiting This list may not describe all possible side effects. Call your doctor for medical advice about side effects. You may report side effects to FDA at 1-800-FDA-1088. Where should I keep my medicine? This drug is given in a hospital or clinic and will  not be stored at home. NOTE: This sheet is a summary. It may not cover all possible information. If you have questions about this medicine, talk to your doctor, pharmacist, or health care provider.  2021 Elsevier/Gold Standard (2007-11-12 16:50:29)  Fluorouracil, 5-FU injection What is this medicine? FLUOROURACIL, 5-FU (flure oh YOOR a sil) is a chemotherapy drug. It slows the growth of cancer cells. This medicine is used to treat many types of cancer like breast cancer, colon or rectal cancer, pancreatic cancer, and stomach cancer. This medicine may be used for other purposes; ask your health care provider or pharmacist if you have questions. COMMON BRAND NAME(S): Adrucil What should I tell my   health care provider before I take this medicine? They need to know if you have any of these conditions:  blood disorders  dihydropyrimidine dehydrogenase (DPD) deficiency  infection (especially a virus infection such as chickenpox, cold sores, or herpes)  kidney disease  liver disease  malnourished, poor nutrition  recent or ongoing radiation therapy  an unusual or allergic reaction to fluorouracil, other chemotherapy, other medicines, foods, dyes, or preservatives  pregnant or trying to get pregnant  breast-feeding How should I use this medicine? This drug is given as an infusion or injection into a vein. It is administered in a hospital or clinic by a specially trained health care professional. Talk to your pediatrician regarding the use of this medicine in children. Special care may be needed. Overdosage: If you think you have taken too much of this medicine contact a poison control center or emergency room at once. NOTE: This medicine is only for you. Do not share this medicine with others. What if I miss a dose? It is important not to miss your dose. Call your doctor or health care professional if you are unable to keep an appointment. What may interact with this medicine? Do not  take this medicine with any of the following medications:  live virus vaccines This medicine may also interact with the following medications:  medicines that treat or prevent blood clots like warfarin, enoxaparin, and dalteparin This list may not describe all possible interactions. Give your health care provider a list of all the medicines, herbs, non-prescription drugs, or dietary supplements you use. Also tell them if you smoke, drink alcohol, or use illegal drugs. Some items may interact with your medicine. What should I watch for while using this medicine? Visit your doctor for checks on your progress. This drug may make you feel generally unwell. This is not uncommon, as chemotherapy can affect healthy cells as well as cancer cells. Report any side effects. Continue your course of treatment even though you feel ill unless your doctor tells you to stop. In some cases, you may be given additional medicines to help with side effects. Follow all directions for their use. Call your doctor or health care professional for advice if you get a fever, chills or sore throat, or other symptoms of a cold or flu. Do not treat yourself. This drug decreases your body's ability to fight infections. Try to avoid being around people who are sick. This medicine may increase your risk to bruise or bleed. Call your doctor or health care professional if you notice any unusual bleeding. Be careful brushing and flossing your teeth or using a toothpick because you may get an infection or bleed more easily. If you have any dental work done, tell your dentist you are receiving this medicine. Avoid taking products that contain aspirin, acetaminophen, ibuprofen, naproxen, or ketoprofen unless instructed by your doctor. These medicines may hide a fever. Do not become pregnant while taking this medicine. Women should inform their doctor if they wish to become pregnant or think they might be pregnant. There is a potential for  serious side effects to an unborn child. Talk to your health care professional or pharmacist for more information. Do not breast-feed an infant while taking this medicine. Men should inform their doctor if they wish to father a child. This medicine may lower sperm counts. Do not treat diarrhea with over the counter products. Contact your doctor if you have diarrhea that lasts more than 2 days or if it is severe and   watery. This medicine can make you more sensitive to the sun. Keep out of the sun. If you cannot avoid being in the sun, wear protective clothing and use sunscreen. Do not use sun lamps or tanning beds/booths. What side effects may I notice from receiving this medicine? Side effects that you should report to your doctor or health care professional as soon as possible:  allergic reactions like skin rash, itching or hives, swelling of the face, lips, or tongue  low blood counts - this medicine may decrease the number of white blood cells, red blood cells and platelets. You may be at increased risk for infections and bleeding.  signs of infection - fever or chills, cough, sore throat, pain or difficulty passing urine  signs of decreased platelets or bleeding - bruising, pinpoint red spots on the skin, black, tarry stools, blood in the urine  signs of decreased red blood cells - unusually weak or tired, fainting spells, lightheadedness  breathing problems  changes in vision  chest pain  mouth sores  nausea and vomiting  pain, swelling, redness at site where injected  pain, tingling, numbness in the hands or feet  redness, swelling, or sores on hands or feet  stomach pain  unusual bleeding Side effects that usually do not require medical attention (report to your doctor or health care professional if they continue or are bothersome):  changes in finger or toe nails  diarrhea  dry or itchy skin  hair loss  headache  loss of appetite  sensitivity of eyes to the  light  stomach upset  unusually teary eyes This list may not describe all possible side effects. Call your doctor for medical advice about side effects. You may report side effects to FDA at 1-800-FDA-1088. Where should I keep my medicine? This drug is given in a hospital or clinic and will not be stored at home. NOTE: This sheet is a summary. It may not cover all possible information. If you have questions about this medicine, talk to your doctor, pharmacist, or health care provider.  2021 Elsevier/Gold Standard (2019-04-08 15:00:03)     

## 2020-10-11 NOTE — Progress Notes (Signed)
[place] OFFICE PROGRESS NOTE   Diagnosis: Colon cancer  INTERVAL HISTORY:   [name] completed another cycle of FOLFOX on 09/27/2020.  He had cold sensitivity following chemotherapy.  No neuropathy symptoms at present.  He had an episode of nausea and vomiting on day 9.  This was followed by diarrhea for several days.  He has intermittent anorexia.  Objective:  Vital signs in last 24 hours:  Blood pressure (!) 142/86, pulse 77, temperature 98.2 F (36.8 C), temperature source Oral, resp. rate 18, height $RemoveBe'6\' 1"'CnIgFFyxO$  (1.854 m), weight 200 lb (90.7 kg), SpO2 100 %.    HEENT: No thrush or ulcers Resp: Lungs clear bilaterally Cardio: Regular rate and rhythm GI: Nontender, no hepatomegaly Vascular: No leg edema  Skin: Palms without erythema  Portacath/PICC-without erythema  Lab Results:  Lab Results  Component Value Date   WBC 4.3 10/11/2020   HGB 8.6 (L) 10/11/2020   HCT 25.7 (L) 10/11/2020   MCV 87.1 10/11/2020   PLT 195 10/11/2020   NEUTROABS 1.9 10/11/2020    CMP  Lab Results  Component Value Date   NA 136 09/27/2020   K 3.9 09/27/2020   CL 100 09/27/2020   CO2 27 09/27/2020   GLUCOSE 245 (H) 09/27/2020   BUN 27 (H) 09/27/2020   CREATININE 2.10 (H) 09/27/2020   CALCIUM 8.7 (L) 09/27/2020   PROT 6.8 09/27/2020   ALBUMIN 3.5 09/27/2020   AST 14 (L) 09/27/2020   ALT 8 09/27/2020   ALKPHOS 113 09/27/2020   BILITOT 0.7 09/27/2020   GFRNONAA 36 (L) 09/27/2020   GFRAA >60 01/26/2018    Lab Results  Component Value Date   CEA1 11.44 (H) 09/27/2020     Medications: I have reviewed the patient's current medications.   Assessment/Plan: 1.   Descending colon cancer, stage IIIc (T3N2b M0), status post a partial left colectomy 07/30/2020, 9/16 lymph nodes positive, lymphovascular invasion, 1 satellite nodule, negative margins, MSS, no loss of mismatch repair protein expression -History of large polyp in the left side of the colon-referred to [facility] in  05/2018 for procedure canceled secondary to COVID-19 pandemic. Procedure was not rescheduled. -CT chest/abdomen/pelvis with contrast 07/27/2020-3 small pulmonary nodules less than 5 mm favored to be benign, circumferential luminal narrowing of the distal transverse colon concerning for malignancy, no metastatic adenopathy in the mesentery porta hepatis, no for metastasis. -CEA on 07/27/2020 was 17.3; 37 on 08/30/2020; 33 on 09/13/2020 -Colonoscopy performed 07/27/2020 showed a fungating, infiltrative and ulcerated nonobstructing large mass in the proximal descending colon.  Biopsy-adenocarcinoma -Cycle 1 FOLFOX 08/30/2020 -Cycle 2 FOLFOX 09/13/2020, Emend added for delayed nausea -Cycle 3 FOLFOX 09/27/2020 -Cycle 4 FOLFOX 10/11/2020 2. Anemia due to GI bleeding,iron deficiency?, Renal insufficiency? 3. New onset acute diastolic CHF March 4982 4. Diabetes mellitus 5. Renal insufficiency 6. Hypertension 7. History of left transmetatarsal amputation 8.History of colon polyps 9.  Neuropathy   Disposition: Jeffrey Young has a history of colon cancer.  He is completed 3 cycles of FOLFOX.  He has tolerated the chemotherapy well.  He will use Imodium if he develops diarrhea following this cycle of chemotherapy.  I encouraged him to increase his oral intake.  The CEA was improved when he was here 2 weeks ago.  We will follow-up on the CEA from today.  He understands the elevated CEA may indicate a diagnosis of metastatic colon cancer.  We will continue following the CEA and plan for a restaging CT evaluation after cycle 6.  We  will refer him for CTs sooner if the CEA rises.  Mr. Guevara will return for an office visit and chemotherapy in 2 weeks.  Betsy Coder, MD  10/11/2020  9:43 AM

## 2020-10-11 NOTE — Progress Notes (Signed)
Dr. Benay Spice, ok to treat with Scr 2.0 and K 3.3.

## 2020-10-11 NOTE — Patient Instructions (Signed)

## 2020-10-12 ENCOUNTER — Telehealth: Payer: Self-pay

## 2020-10-12 ENCOUNTER — Encounter: Payer: Self-pay | Admitting: *Deleted

## 2020-10-12 NOTE — Progress Notes (Signed)
Faxed 10/11/20 office note and labs to [facility] [phone]

## 2020-10-12 NOTE — Telephone Encounter (Signed)
-----   Message from Ladell Pier, MD sent at 10/11/2020  4:07 PM EDT ----- Please call patient, CEA is better, follow-up as scheduled

## 2020-10-12 NOTE — Telephone Encounter (Signed)
Called spoke with pt wife made her aware of most recent CEA results. Pt wife called inquired about a refill request for Coreg this nurse notes medication reordered by provider

## 2020-10-13 ENCOUNTER — Other Ambulatory Visit: Payer: Self-pay

## 2020-10-13 ENCOUNTER — Inpatient Hospital Stay: Payer: 59

## 2020-10-13 VITALS — BP 130/83 | HR 79 | Temp 97.9°F | Resp 18

## 2020-10-13 DIAGNOSIS — Z5111 Encounter for antineoplastic chemotherapy: Secondary | ICD-10-CM | POA: Diagnosis not present

## 2020-10-13 DIAGNOSIS — C185 Malignant neoplasm of splenic flexure: Secondary | ICD-10-CM

## 2020-10-13 MED ORDER — SODIUM CHLORIDE 0.9% FLUSH
10.0000 mL | INTRAVENOUS | Status: DC | PRN
  Administered 2020-10-13: 10 mL
  Filled 2020-10-13: qty 10

## 2020-10-13 MED ORDER — HEPARIN SOD (PORK) LOCK FLUSH 100 UNIT/ML IV SOLN
500.0000 [IU] | Freq: Once | INTRAVENOUS | Status: AC | PRN
  Administered 2020-10-13: 500 [IU]
  Filled 2020-10-13: qty 5

## 2020-10-13 NOTE — Patient Instructions (Signed)

## 2020-10-14 ENCOUNTER — Telehealth: Payer: Self-pay | Admitting: *Deleted

## 2020-10-14 NOTE — Telephone Encounter (Signed)
Received fax from [facility] warning of duplicate therapy. Has been prescribed omeprazole and pantoprazole. Confirmed w/pharmacy that Dr. Benson Norway ordered the omeprazole on 5/14 and it was picked up. He has not picked up the pantoprazole. Called wife and instructed her to have Jeffrey Young decide which he will be taking--do not duplicate therapy. She reports they will stick w/omeprazole for now.

## 2020-10-18 ENCOUNTER — Other Ambulatory Visit: Payer: Self-pay | Admitting: Oncology

## 2020-10-25 ENCOUNTER — Inpatient Hospital Stay: Payer: 59

## 2020-10-25 ENCOUNTER — Encounter: Payer: Self-pay | Admitting: *Deleted

## 2020-10-25 ENCOUNTER — Inpatient Hospital Stay (HOSPITAL_BASED_OUTPATIENT_CLINIC_OR_DEPARTMENT_OTHER): Payer: 59 | Admitting: Nurse Practitioner

## 2020-10-25 ENCOUNTER — Other Ambulatory Visit: Payer: Self-pay

## 2020-10-25 ENCOUNTER — Encounter: Payer: Self-pay | Admitting: Nurse Practitioner

## 2020-10-25 ENCOUNTER — Inpatient Hospital Stay: Payer: 59 | Attending: Oncology

## 2020-10-25 VITALS — BP 141/82 | HR 74

## 2020-10-25 VITALS — BP 93/68 | HR 78 | Temp 97.8°F | Resp 18 | Ht 73.0 in | Wt 185.6 lb

## 2020-10-25 DIAGNOSIS — E860 Dehydration: Secondary | ICD-10-CM | POA: Insufficient documentation

## 2020-10-25 DIAGNOSIS — Z79899 Other long term (current) drug therapy: Secondary | ICD-10-CM | POA: Insufficient documentation

## 2020-10-25 DIAGNOSIS — C186 Malignant neoplasm of descending colon: Secondary | ICD-10-CM | POA: Insufficient documentation

## 2020-10-25 DIAGNOSIS — Z8719 Personal history of other diseases of the digestive system: Secondary | ICD-10-CM | POA: Insufficient documentation

## 2020-10-25 DIAGNOSIS — C185 Malignant neoplasm of splenic flexure: Secondary | ICD-10-CM | POA: Diagnosis not present

## 2020-10-25 DIAGNOSIS — Z5189 Encounter for other specified aftercare: Secondary | ICD-10-CM | POA: Diagnosis not present

## 2020-10-25 DIAGNOSIS — E1140 Type 2 diabetes mellitus with diabetic neuropathy, unspecified: Secondary | ICD-10-CM | POA: Diagnosis not present

## 2020-10-25 DIAGNOSIS — Z794 Long term (current) use of insulin: Secondary | ICD-10-CM | POA: Insufficient documentation

## 2020-10-25 DIAGNOSIS — Z95828 Presence of other vascular implants and grafts: Secondary | ICD-10-CM

## 2020-10-25 DIAGNOSIS — R112 Nausea with vomiting, unspecified: Secondary | ICD-10-CM

## 2020-10-25 DIAGNOSIS — D500 Iron deficiency anemia secondary to blood loss (chronic): Secondary | ICD-10-CM | POA: Diagnosis not present

## 2020-10-25 DIAGNOSIS — K922 Gastrointestinal hemorrhage, unspecified: Secondary | ICD-10-CM | POA: Diagnosis not present

## 2020-10-25 DIAGNOSIS — N289 Disorder of kidney and ureter, unspecified: Secondary | ICD-10-CM | POA: Insufficient documentation

## 2020-10-25 DIAGNOSIS — Z5111 Encounter for antineoplastic chemotherapy: Secondary | ICD-10-CM | POA: Insufficient documentation

## 2020-10-25 DIAGNOSIS — I110 Hypertensive heart disease with heart failure: Secondary | ICD-10-CM | POA: Insufficient documentation

## 2020-10-25 DIAGNOSIS — Z9221 Personal history of antineoplastic chemotherapy: Secondary | ICD-10-CM | POA: Diagnosis not present

## 2020-10-25 LAB — CBC WITH DIFFERENTIAL (CANCER CENTER ONLY)
Abs Immature Granulocytes: 0.01 10*3/uL (ref 0.00–0.07)
Basophils Absolute: 0.1 10*3/uL (ref 0.0–0.1)
Basophils Relative: 1 %
Eosinophils Absolute: 0.1 10*3/uL (ref 0.0–0.5)
Eosinophils Relative: 1 %
HCT: 27 % — ABNORMAL LOW (ref 39.0–52.0)
Hemoglobin: 9.1 g/dL — ABNORMAL LOW (ref 13.0–17.0)
Immature Granulocytes: 0 %
Lymphocytes Relative: 49 %
Lymphs Abs: 2 10*3/uL (ref 0.7–4.0)
MCH: 30.4 pg (ref 26.0–34.0)
MCHC: 33.7 g/dL (ref 30.0–36.0)
MCV: 90.3 fL (ref 80.0–100.0)
Monocytes Absolute: 0.6 10*3/uL (ref 0.1–1.0)
Monocytes Relative: 16 %
Neutro Abs: 1.3 10*3/uL — ABNORMAL LOW (ref 1.7–7.7)
Neutrophils Relative %: 33 %
Platelet Count: 208 10*3/uL (ref 150–400)
RBC: 2.99 MIL/uL — ABNORMAL LOW (ref 4.22–5.81)
RDW: 20.8 % — ABNORMAL HIGH (ref 11.5–15.5)
WBC Count: 4 10*3/uL (ref 4.0–10.5)
nRBC: 0 % (ref 0.0–0.2)

## 2020-10-25 LAB — CMP (CANCER CENTER ONLY)
ALT: 8 U/L (ref 0–44)
AST: 14 U/L — ABNORMAL LOW (ref 15–41)
Albumin: 3.4 g/dL — ABNORMAL LOW (ref 3.5–5.0)
Alkaline Phosphatase: 128 U/L — ABNORMAL HIGH (ref 38–126)
Anion gap: 9 (ref 5–15)
BUN: 21 mg/dL — ABNORMAL HIGH (ref 6–20)
CO2: 27 mmol/L (ref 22–32)
Calcium: 9.2 mg/dL (ref 8.9–10.3)
Chloride: 98 mmol/L (ref 98–111)
Creatinine: 1.79 mg/dL — ABNORMAL HIGH (ref 0.61–1.24)
GFR, Estimated: 44 mL/min — ABNORMAL LOW (ref >60)
Glucose, Bld: 379 mg/dL — ABNORMAL HIGH (ref 70–99)
Potassium: 3.5 mmol/L (ref 3.5–5.1)
Sodium: 134 mmol/L — ABNORMAL LOW (ref 135–145)
Total Bilirubin: 0.8 mg/dL (ref 0.3–1.2)
Total Protein: 6.2 g/dL — ABNORMAL LOW (ref 6.5–8.1)

## 2020-10-25 MED ORDER — HEPARIN SOD (PORK) LOCK FLUSH 100 UNIT/ML IV SOLN
500.0000 [IU] | Freq: Once | INTRAVENOUS | Status: AC
  Administered 2020-10-25: 500 [IU] via INTRAVENOUS
  Filled 2020-10-25: qty 5

## 2020-10-25 MED ORDER — SODIUM CHLORIDE 0.9% FLUSH
10.0000 mL | Freq: Once | INTRAVENOUS | Status: AC
Start: 2020-10-25 — End: 2020-10-25
  Administered 2020-10-25: 10 mL via INTRAVENOUS
  Filled 2020-10-25: qty 10

## 2020-10-25 MED ORDER — SODIUM CHLORIDE 0.9 % IV SOLN
Freq: Once | INTRAVENOUS | Status: AC
Start: 2020-10-25 — End: 2020-10-25
  Filled 2020-10-25: qty 250

## 2020-10-25 NOTE — Progress Notes (Signed)
Orthostatic BP's completed halfway through fluid infusion.  Patient states has been drinking PO fluids along with the infusion, and states he is feeling better.  Ned Card, NP made aware.   Ned Card, NP in infusion room to evaluate patient.  Per Ned Card, recheck orthostatic BP's at the conclusion of the IV fluids.   Orthostatic BP's rechecked at conclusion of fluids. Patient states he feels better and denies dizziness while standing. Ned Card, NP made aware.  Per Ned Card, NP, ok for patient to be discharged.  Patient encouraged to continue to drink fluids at home and to contact this office if he has any questions or concerns. Patient verbalized understanding.

## 2020-10-25 NOTE — Patient Instructions (Signed)
Rehydration, Adult Rehydration is the replacement of body fluids, salts, and minerals (electrolytes) that are lost during dehydration. Dehydration is when there is not enough water or other fluids in the body. This happens when you lose more fluids than you take in. Common causes of dehydration include:  Not drinking enough fluids. This can occur when you are ill or doing activities that require a lot of energy, especially in hot weather.  Conditions that cause loss of water or other fluids, such as diarrhea, vomiting, sweating, or urinating a lot.  Other illnesses, such as fever or infection.  Certain medicines, such as those that remove excess fluid from the body (diuretics). Symptoms of mild or moderate dehydration may include thirst, dry lips and mouth, and dizziness. Symptoms of severe dehydration may include increased heart rate, confusion, fainting, and not urinating. For severe dehydration, you may need to get fluids through an IV at the hospital. For mild or moderate dehydration, you can usually rehydrate at home by drinking certain fluids as told by your health care provider. What are the risks? Generally, rehydration is safe. However, taking in too much fluid (overhydration) can be a problem. This is rare. Overhydration can cause an electrolyte imbalance, kidney failure, or a decrease in salt (sodium) levels in the body. Supplies needed You will need an oral rehydration solution (ORS) if your health care provider tells you to use one. This is a drink to treat dehydration. It can be found in pharmacies and retail stores. How to rehydrate Fluids Follow instructions from your health care provider for rehydration. The kind of fluid and the amount you should drink depend on your condition. In general, you should choose drinks that you prefer.  If told by your health care provider, drink an ORS. ? Make an ORS by following instructions on the package. ? Start by drinking small amounts,  about  cup (120 mL) every 5-10 minutes. ? Slowly increase how much you drink until you have taken the amount recommended by your health care provider.  Drink enough clear fluids to keep your urine pale yellow. If you were told to drink an ORS, finish it first, then start slowly drinking other clear fluids. Drink fluids such as: ? Water. This includes sparkling water and flavored water. Drinking only water can lead to having too little sodium in your body (hyponatremia). Follow the advice of your health care provider. ? Water from ice chips you suck on. ? Fruit juice with water you add to it (diluted). ? Sports drinks. ? Hot or cold herbal teas. ? Broth-based soups. ? Milk or milk products. Food Follow instructions from your health care provider about what to eat while you rehydrate. Your health care provider may recommend that you slowly begin eating regular foods in small amounts.  Eat foods that contain a healthy balance of electrolytes, such as bananas, oranges, potatoes, tomatoes, and spinach.  Avoid foods that are greasy or contain a lot of sugar. In some cases, you may get nutrition through a feeding tube that is passed through your nose and into your stomach (nasogastric tube, or NG tube). This may be done if you have uncontrolled vomiting or diarrhea.   Beverages to avoid Certain beverages may make dehydration worse. While you rehydrate, avoid drinking alcohol.   How to tell if you are recovering from dehydration You may be recovering from dehydration if:  You are urinating more often than before you started rehydrating.  Your urine is pale yellow.  Your energy level   improves.  You vomit less frequently.  You have diarrhea less frequently.  Your appetite improves or returns to normal.  You feel less dizzy or less light-headed.  Your skin tone and color start to look more normal. Follow these instructions at home:  Take over-the-counter and prescription medicines only  as told by your health care provider.  Do not take sodium tablets. Doing this can lead to having too much sodium in your body (hypernatremia). Contact a health care provider if:  You continue to have symptoms of mild or moderate dehydration, such as: ? Thirst. ? Dry lips. ? Slightly dry mouth. ? Dizziness. ? Dark urine or less urine than normal. ? Muscle cramps.  You continue to vomit or have diarrhea. Get help right away if you:  Have symptoms of dehydration that get worse.  Have a fever.  Have a severe headache.  Have been vomiting and the following happens: ? Your vomiting gets worse or does not go away. ? Your vomit includes blood or green matter (bile). ? You cannot eat or drink without vomiting.  Have problems with urination or bowel movements, such as: ? Diarrhea that gets worse or does not go away. ? Blood in your stool (feces). This may cause stool to look black and tarry. ? Not urinating, or urinating only a small amount of very dark urine, within 6-8 hours.  Have trouble breathing.  Have symptoms that get worse with treatment. These symptoms may represent a serious problem that is an emergency. Do not wait to see if the symptoms will go away. Get medical help right away. Call your local emergency services (911 in the [place] Do not drive yourself to the hospital. Summary  Rehydration is the replacement of body fluids and minerals (electrolytes) that are lost during dehydration.  Follow instructions from your health care provider for rehydration. The kind of fluid and amount you should drink depend on your condition.  Slowly increase how much you drink until you have taken the amount recommended by your health care provider.  Contact your health care provider if you continue to show signs of mild or moderate dehydration. This information is not intended to replace advice given to you by your health care provider. Make sure you discuss any questions you have with  your health care provider. Document Revised: 07/09/2019 Document Reviewed: 05/19/2019 Elsevier Patient Education  2021 [employer].  

## 2020-10-25 NOTE — Patient Instructions (Signed)

## 2020-10-25 NOTE — Progress Notes (Addendum)
[facility] OFFICE PROGRESS NOTE   Diagnosis: Colon cancer  INTERVAL HISTORY:   Jeffrey Young returns as scheduled.  He completed cycle 4 FOLFOX 10/11/2020.  He began having nausea/vomiting day 2.  This lasted 3 to 4 days.  He is now having intermittent nausea/vomiting.  Oral intake is poor.  No mouth sores.  No diarrhea.  About 5 days ago he fell in his driveway.  He reports laying in the driveway for 2 hours until his father arrived and assisted him back to the house.  He is not sure that he lost consciousness.  He denies dizziness.  No unusual headaches or vision change.  He denies shortness of breath and chest pain.  His wife reports he refused evaluation in the emergency department.  Objective:  Vital signs in last 24 hours:  Blood pressure 93/68, pulse 78, temperature 97.8 F (36.6 C), temperature source Oral, resp. rate 18, height 6\' 1"  (1.854 m), weight 185 lb 9.6 oz (84.2 kg), SpO2 100 %.    HEENT: White coating over tongue.  No thrush or ulcers.  Pupils equal round and reactive to light.  Extraocular movements intact. Resp: Lungs clear bilaterally.  No respiratory distress. Cardio: Regular rate and rhythm. GI: Abdomen soft and nontender.  No hepatomegaly. Vascular: No leg edema.  Calves soft and nontender. Neuro: Alert and oriented.  Follows commands.  Motor strength 5/5. Skin: Palms without erythema. Port-A-Cath without erythema.   Lab Results:  Lab Results  Component Value Date   WBC 4.0 10/25/2020   HGB 9.1 (L) 10/25/2020   HCT 27.0 (L) 10/25/2020   MCV 90.3 10/25/2020   PLT 208 10/25/2020   NEUTROABS 1.3 (L) 10/25/2020    Imaging:  No results found.  Medications: I have reviewed the patient's current medications.  Assessment/Plan: 1.  Descending colon cancer, stage IIIc (T3N2b M0), status post a partial left colectomy 07/30/2020, 9/16 lymph nodes positive, lymphovascular invasion, 1 satellite nodule, negative margins,MSS, no loss of mismatch  repair protein expression -History of large polyp in the left side of the colon-referred to [facility] in 05/2018 for procedure canceled secondary to COVID-19 pandemic. Procedure was not rescheduled. -CT chest/abdomen/pelvis with contrast 07/27/2020-3 small pulmonary nodules less than 5 mm favored to be benign, circumferential luminal narrowing of the distal transverse colon concerning for malignancy, no metastatic adenopathy in the mesentery porta hepatis, no for metastasis. -CEA on 07/27/2020 was 17.3; 37 on 08/30/2020; 33 on 09/13/2020 -Colonoscopy performed 07/27/2020 showed a fungating, infiltrative and ulcerated nonobstructing large mass in the proximal descending colon. Biopsy-adenocarcinoma -Cycle 1 FOLFOX 08/30/2020 -Cycle 2 FOLFOX 09/13/2020, Emend added for delayed nausea -Cycle 3 FOLFOX 09/27/2020 -Cycle 4 FOLFOX 10/11/2020 2. Anemia due to GI bleeding,iron deficiency?, Renal insufficiency? 3. New onset acute diastolic CHF March 2778 4. Diabetes mellitus 5.Renal insufficiency 6. Hypertension 7. History of left transmetatarsal amputation 8.History of colon polyps 9. Neuropathy  Disposition: Jeffrey Young appears unchanged.  He has completed 4 cycles of FOLFOX.  He is having significant nausea/vomiting following chemotherapy.  He continues to lose weight.  He had a fall last week and is orthostatic in the office today.  We are holding today's treatment.  He is likely dehydrated.  He will receive IV fluids.  Blood pressure medications placed on hold.  We will obtain repeat vitals following IV fluids.  His wife will schedule an appointment with his PCP for long-term management of blood pressure.  He will return for lab, follow-up, possible chemotherapy in 1 week.  We  are available to see him sooner if needed.     Ned Card ANP/GNP-BC   10/25/2020  9:10 AM   Addendum 10:45 AM- marked improvement in blood pressure after 500 cc of normal saline.  Tolerating liquids orally in the infusion  area.  Jeffrey Young reports he is feeling better.  Plan to repeat vital signs again after the full liter of IV fluids.  If blood pressure remains improved he can be discharged home with follow-up next week.

## 2020-10-25 NOTE — Progress Notes (Signed)
Faxed 10/25/20 office note and lab results to [facility] [phone].

## 2020-10-26 LAB — CEA (ACCESS): CEA (CHCC): 6.59 ng/mL — ABNORMAL HIGH (ref 0.00–5.00)

## 2020-10-27 ENCOUNTER — Inpatient Hospital Stay: Payer: 59

## 2020-10-27 ENCOUNTER — Other Ambulatory Visit: Payer: Self-pay | Admitting: *Deleted

## 2020-10-27 DIAGNOSIS — C185 Malignant neoplasm of splenic flexure: Secondary | ICD-10-CM

## 2020-10-31 ENCOUNTER — Other Ambulatory Visit: Payer: Self-pay | Admitting: Oncology

## 2020-11-01 ENCOUNTER — Inpatient Hospital Stay: Payer: 59

## 2020-11-01 ENCOUNTER — Encounter: Payer: Self-pay | Admitting: *Deleted

## 2020-11-01 ENCOUNTER — Inpatient Hospital Stay (HOSPITAL_BASED_OUTPATIENT_CLINIC_OR_DEPARTMENT_OTHER): Payer: 59 | Admitting: Nurse Practitioner

## 2020-11-01 ENCOUNTER — Encounter: Payer: Self-pay | Admitting: Nurse Practitioner

## 2020-11-01 ENCOUNTER — Other Ambulatory Visit: Payer: Self-pay

## 2020-11-01 VITALS — BP 138/83 | HR 86 | Temp 97.8°F | Resp 18 | Ht 73.0 in | Wt 189.6 lb

## 2020-11-01 DIAGNOSIS — C185 Malignant neoplasm of splenic flexure: Secondary | ICD-10-CM

## 2020-11-01 DIAGNOSIS — Z5111 Encounter for antineoplastic chemotherapy: Secondary | ICD-10-CM | POA: Diagnosis not present

## 2020-11-01 LAB — CBC WITH DIFFERENTIAL (CANCER CENTER ONLY)
Abs Immature Granulocytes: 0.1 10*3/uL — ABNORMAL HIGH (ref 0.00–0.07)
Basophils Absolute: 0.1 10*3/uL (ref 0.0–0.1)
Basophils Relative: 1 %
Eosinophils Absolute: 0.2 10*3/uL (ref 0.0–0.5)
Eosinophils Relative: 4 %
HCT: 29.2 % — ABNORMAL LOW (ref 39.0–52.0)
Hemoglobin: 9.8 g/dL — ABNORMAL LOW (ref 13.0–17.0)
Immature Granulocytes: 2 %
Lymphocytes Relative: 42 %
Lymphs Abs: 2.1 10*3/uL (ref 0.7–4.0)
MCH: 31.1 pg (ref 26.0–34.0)
MCHC: 33.6 g/dL (ref 30.0–36.0)
MCV: 92.7 fL (ref 80.0–100.0)
Monocytes Absolute: 0.9 10*3/uL (ref 0.1–1.0)
Monocytes Relative: 19 %
Neutro Abs: 1.5 10*3/uL — ABNORMAL LOW (ref 1.7–7.7)
Neutrophils Relative %: 32 %
Platelet Count: 188 10*3/uL (ref 150–400)
RBC: 3.15 MIL/uL — ABNORMAL LOW (ref 4.22–5.81)
RDW: 19.9 % — ABNORMAL HIGH (ref 11.5–15.5)
WBC Count: 4.9 10*3/uL (ref 4.0–10.5)
nRBC: 0 % (ref 0.0–0.2)

## 2020-11-01 LAB — CMP (CANCER CENTER ONLY)
ALT: 14 U/L (ref 0–44)
AST: 20 U/L (ref 15–41)
Albumin: 3.2 g/dL — ABNORMAL LOW (ref 3.5–5.0)
Alkaline Phosphatase: 143 U/L — ABNORMAL HIGH (ref 38–126)
Anion gap: 9 (ref 5–15)
BUN: 19 mg/dL (ref 6–20)
CO2: 26 mmol/L (ref 22–32)
Calcium: 9.2 mg/dL (ref 8.9–10.3)
Chloride: 102 mmol/L (ref 98–111)
Creatinine: 1.64 mg/dL — ABNORMAL HIGH (ref 0.61–1.24)
GFR, Estimated: 48 mL/min — ABNORMAL LOW (ref >60)
Glucose, Bld: 244 mg/dL — ABNORMAL HIGH (ref 70–99)
Potassium: 3.6 mmol/L (ref 3.5–5.1)
Sodium: 137 mmol/L (ref 135–145)
Total Bilirubin: 0.7 mg/dL (ref 0.3–1.2)
Total Protein: 6.3 g/dL — ABNORMAL LOW (ref 6.5–8.1)

## 2020-11-01 NOTE — Progress Notes (Signed)
Faxed 11/01/20 labs and office note to [facility] # [phone].

## 2020-11-01 NOTE — Patient Instructions (Signed)

## 2020-11-01 NOTE — Progress Notes (Addendum)
[facility] OFFICE PROGRESS NOTE   Diagnosis: Colon cancer  INTERVAL HISTORY:   Jeffrey Young returns as scheduled.  He completed cycle 4 FOLFOX 10/11/2020.  Cycle 5 was held on 10/25/2020 due to nausea/vomiting/hypotension, dehydration.  He received IV fluids.  Blood pressure medications were discontinued.  He is feeling much better.  No longer having nausea/vomiting.  No diarrhea.  No numbness or tingling in the hands or feet.  He reports a good appetite.  No falls.  Objective:  Vital signs in last 24 hours:  Blood pressure 138/83, pulse 86, temperature 97.8 F (36.6 C), temperature source Oral, resp. rate 18, height $RemoveBe'6\' 1"'UwbIdKxOl$  (1.854 m), weight 189 lb 9.6 oz (86 kg), SpO2 100 %.    HEENT: Mild white coating over tongue. Resp: Lungs clear bilaterally. Cardio: Regular rate and rhythm. GI: Abdomen soft and nontender.  No hepatomegaly. Vascular: No leg edema. Neuro: Vibratory sense mildly decreased over the fingertips per tuning fork exam. Skin: Palms without erythema. Port-A-Cath without erythema.   Lab Results:  Lab Results  Component Value Date   WBC 4.9 11/01/2020   HGB 9.8 (L) 11/01/2020   HCT 29.2 (L) 11/01/2020   MCV 92.7 11/01/2020   PLT 188 11/01/2020   NEUTROABS 1.5 (L) 11/01/2020    Imaging:  No results found.  Medications: I have reviewed the patient's current medications.  Assessment/Plan: 1.   Descending colon cancer, stage IIIc (T3N2b M0), status post a partial left colectomy 07/30/2020, 9/16 lymph nodes positive, lymphovascular invasion, 1 satellite nodule, negative margins, MSS, no loss of mismatch repair protein expression -History of large polyp in the left side of the colon-referred to [facility] in 05/2018 for procedure canceled secondary to COVID-19 pandemic.  Procedure was not rescheduled. -CT chest/abdomen/pelvis with contrast 07/27/2020-3 small pulmonary nodules less than 5 mm favored to be benign, circumferential luminal narrowing of the distal  transverse colon concerning for malignancy, no metastatic adenopathy in the mesentery porta hepatis, no for metastasis. -CEA on 07/27/2020 was 17.3; 37 on 08/30/2020; 33 on 09/13/2020 -Colonoscopy performed 07/27/2020 showed a fungating, infiltrative and ulcerated nonobstructing large mass in the proximal descending colon.  Biopsy-adenocarcinoma -Cycle 1 FOLFOX 08/30/2020 -Cycle 2 FOLFOX 09/13/2020, Emend added for delayed nausea -Cycle 3 FOLFOX 09/27/2020 -Cycle 4 FOLFOX 10/11/2020 2.  Anemia due to GI bleeding, iron deficiency?,  Renal insufficiency? 3.  New onset acute diastolic CHF March 8921 4.  Diabetes mellitus 5.  Renal insufficiency 6.  Hypertension 7.  History of left transmetatarsal amputation 8.  History of colon polyps 9.  Neuropathy  Disposition: Jeffrey Young appears improved.  He has completed 4 cycles of FOLFOX.  Plan to proceed with cycle 5 today as scheduled.  Chemotherapy doses have been adjusted for weight loss.  We discussed Zyprexa for the delayed nausea.  He declines this for now.  5-FU bolus will be eliminated.  We will schedule him to receive IV fluids on the day of pump discontinuation, antiemetic if needed.  I am hesitant to add prophylactic steroids due to poorly controlled blood sugar.  We reviewed the CBC from today.  He has mild neutropenia.  We are unable to obtain insurance approval for white cell growth factor support.  Potential toxicities were reviewed with him including bone pain, rash, splenic rupture.  He agrees with white cell growth factor support.  Due to lack of insurance approval we are canceling today's treatment and rescheduling for 1 week.  He will return for lab, follow-up, cycle 5 FOLFOX in 1  week.  We are available to see him sooner if needed.  Above discussed with Dr. Benay Spice with his agreement.    Ned Card ANP/GNP-BC   11/01/2020  8:23 AM

## 2020-11-03 ENCOUNTER — Ambulatory Visit: Payer: 59

## 2020-11-05 ENCOUNTER — Other Ambulatory Visit: Payer: Self-pay | Admitting: Oncology

## 2020-11-08 ENCOUNTER — Inpatient Hospital Stay: Payer: 59

## 2020-11-08 ENCOUNTER — Ambulatory Visit: Payer: 59

## 2020-11-08 ENCOUNTER — Encounter: Payer: Self-pay | Admitting: *Deleted

## 2020-11-08 ENCOUNTER — Inpatient Hospital Stay (HOSPITAL_BASED_OUTPATIENT_CLINIC_OR_DEPARTMENT_OTHER): Payer: 59 | Admitting: Oncology

## 2020-11-08 ENCOUNTER — Ambulatory Visit: Payer: 59 | Admitting: Oncology

## 2020-11-08 ENCOUNTER — Other Ambulatory Visit: Payer: Self-pay

## 2020-11-08 ENCOUNTER — Other Ambulatory Visit: Payer: 59

## 2020-11-08 ENCOUNTER — Other Ambulatory Visit: Payer: Self-pay | Admitting: Oncology

## 2020-11-08 VITALS — BP 159/94 | HR 83 | Temp 97.7°F | Resp 18 | Wt 189.6 lb

## 2020-11-08 DIAGNOSIS — C185 Malignant neoplasm of splenic flexure: Secondary | ICD-10-CM | POA: Diagnosis not present

## 2020-11-08 DIAGNOSIS — E119 Type 2 diabetes mellitus without complications: Secondary | ICD-10-CM

## 2020-11-08 DIAGNOSIS — Z5111 Encounter for antineoplastic chemotherapy: Secondary | ICD-10-CM | POA: Diagnosis not present

## 2020-11-08 DIAGNOSIS — Z794 Long term (current) use of insulin: Secondary | ICD-10-CM

## 2020-11-08 LAB — CMP (CANCER CENTER ONLY)
ALT: 19 U/L (ref 0–44)
AST: 23 U/L (ref 15–41)
Albumin: 3.3 g/dL — ABNORMAL LOW (ref 3.5–5.0)
Alkaline Phosphatase: 146 U/L — ABNORMAL HIGH (ref 38–126)
Anion gap: 8 (ref 5–15)
BUN: 23 mg/dL — ABNORMAL HIGH (ref 6–20)
CO2: 28 mmol/L (ref 22–32)
Calcium: 9.2 mg/dL (ref 8.9–10.3)
Chloride: 98 mmol/L (ref 98–111)
Creatinine: 1.77 mg/dL — ABNORMAL HIGH (ref 0.61–1.24)
GFR, Estimated: 44 mL/min — ABNORMAL LOW (ref >60)
Glucose, Bld: 391 mg/dL — ABNORMAL HIGH (ref 70–99)
Potassium: 4.2 mmol/L (ref 3.5–5.1)
Sodium: 134 mmol/L — ABNORMAL LOW (ref 135–145)
Total Bilirubin: 0.9 mg/dL (ref 0.3–1.2)
Total Protein: 6.4 g/dL — ABNORMAL LOW (ref 6.5–8.1)

## 2020-11-08 LAB — CBC WITH DIFFERENTIAL (CANCER CENTER ONLY)
Abs Immature Granulocytes: 0.09 10*3/uL — ABNORMAL HIGH (ref 0.00–0.07)
Basophils Absolute: 0.1 10*3/uL (ref 0.0–0.1)
Basophils Relative: 1 %
Eosinophils Absolute: 0.1 10*3/uL (ref 0.0–0.5)
Eosinophils Relative: 2 %
HCT: 29.2 % — ABNORMAL LOW (ref 39.0–52.0)
Hemoglobin: 9.5 g/dL — ABNORMAL LOW (ref 13.0–17.0)
Immature Granulocytes: 1 %
Lymphocytes Relative: 26 %
Lymphs Abs: 1.9 10*3/uL (ref 0.7–4.0)
MCH: 30.9 pg (ref 26.0–34.0)
MCHC: 32.5 g/dL (ref 30.0–36.0)
MCV: 95.1 fL (ref 80.0–100.0)
Monocytes Absolute: 0.8 10*3/uL (ref 0.1–1.0)
Monocytes Relative: 11 %
Neutro Abs: 4.4 10*3/uL (ref 1.7–7.7)
Neutrophils Relative %: 59 %
Platelet Count: 170 10*3/uL (ref 150–400)
RBC: 3.07 MIL/uL — ABNORMAL LOW (ref 4.22–5.81)
RDW: 18.4 % — ABNORMAL HIGH (ref 11.5–15.5)
WBC Count: 7.3 10*3/uL (ref 4.0–10.5)
nRBC: 0 % (ref 0.0–0.2)

## 2020-11-08 LAB — CEA (ACCESS): CEA (CHCC): 5.37 ng/mL — ABNORMAL HIGH (ref 0.00–5.00)

## 2020-11-08 MED ORDER — SODIUM CHLORIDE 0.9 % IV SOLN
150.0000 mg | Freq: Once | INTRAVENOUS | Status: AC
Start: 2020-11-08 — End: 2020-11-08
  Administered 2020-11-08: 150 mg via INTRAVENOUS
  Filled 2020-11-08: qty 5

## 2020-11-08 MED ORDER — LEUCOVORIN CALCIUM INJECTION 350 MG
400.0000 mg/m2 | Freq: Once | INTRAVENOUS | Status: AC
  Administered 2020-11-08: 832 mg via INTRAVENOUS
  Filled 2020-11-08: qty 41.6

## 2020-11-08 MED ORDER — DEXTROSE 5 % IV SOLN
Freq: Once | INTRAVENOUS | Status: AC
  Filled 2020-11-08: qty 250

## 2020-11-08 MED ORDER — INSULIN ASPART 100 UNIT/ML IJ SOLN
10.0000 [IU] | Freq: Once | INTRAMUSCULAR | Status: AC
  Administered 2020-11-08: 10 [IU] via SUBCUTANEOUS
  Filled 2020-11-08: qty 0.1

## 2020-11-08 MED ORDER — SODIUM CHLORIDE 0.9 % IV SOLN
10.0000 mg | Freq: Once | INTRAVENOUS | Status: AC
  Administered 2020-11-08: 10 mg via INTRAVENOUS
  Filled 2020-11-08: qty 1

## 2020-11-08 MED ORDER — SODIUM CHLORIDE 0.9 % IV SOLN
2400.0000 mg/m2 | INTRAVENOUS | Status: DC
  Administered 2020-11-08: 5000 mg via INTRAVENOUS
  Filled 2020-11-08: qty 100

## 2020-11-08 MED ORDER — PALONOSETRON HCL INJECTION 0.25 MG/5ML
0.2500 mg | Freq: Once | INTRAVENOUS | Status: AC
  Administered 2020-11-08: 0.25 mg via INTRAVENOUS
  Filled 2020-11-08: qty 5

## 2020-11-08 MED ORDER — OXALIPLATIN CHEMO INJECTION 100 MG/20ML
65.0000 mg/m2 | Freq: Once | INTRAVENOUS | Status: AC
  Administered 2020-11-08: 135 mg via INTRAVENOUS
  Filled 2020-11-08: qty 20

## 2020-11-08 NOTE — Patient Instructions (Signed)
[facility]  Discharge Instructions: Thank you for choosing [facility] to provide your oncology and hematology care.   If you have a lab appointment with the [facility], please go directly to the [facility] and check in at the registration area.   Wear comfortable clothing and clothing appropriate for easy access to any Portacath or PICC line.   We strive to give you quality time with your provider. You may need to reschedule your appointment if you arrive late (15 or more minutes).  Arriving late affects you and other patients whose appointments are after yours.  Also, if you miss three or more appointments without notifying the office, you may be dismissed from the clinic at the provider's discretion.      For prescription refill requests, have your pharmacy contact our office and allow 72 hours for refills to be completed.    Today you received the following chemotherapy and/or immunotherapy agents oxaliplatin, leucovorin, fluorouracil   To help prevent nausea and vomiting after your treatment, we encourage you to take your nausea medication as directed.  BELOW ARE SYMPTOMS THAT SHOULD BE REPORTED IMMEDIATELY: *FEVER GREATER THAN 100.4 F (38 C) OR HIGHER *CHILLS OR SWEATING *NAUSEA AND VOMITING THAT IS NOT CONTROLLED WITH YOUR NAUSEA MEDICATION *UNUSUAL SHORTNESS OF BREATH *UNUSUAL BRUISING OR BLEEDING *URINARY PROBLEMS (pain or burning when urinating, or frequent urination) *BOWEL PROBLEMS (unusual diarrhea, constipation, pain near the anus) TENDERNESS IN MOUTH AND THROAT WITH OR WITHOUT PRESENCE OF ULCERS (sore throat, sores in mouth, or a toothache) UNUSUAL RASH, SWELLING OR PAIN  UNUSUAL VAGINAL DISCHARGE OR ITCHING   Items with * indicate a potential emergency and should be followed up as soon as possible or go to the Emergency Department if any problems should occur.  Please show the CHEMOTHERAPY ALERT CARD or IMMUNOTHERAPY ALERT  CARD at check-in to the Emergency Department and triage nurse.  Should you have questions after your visit or need to cancel or reschedule your appointment, please contact [facility]  Dept: [phone]  and follow the prompts.  Office hours are 8:00 a.m. to 4:30 p.m. Monday - Friday. Please note that voicemails left after 4:00 p.m. may not be returned until the following business day.  We are closed weekends and major holidays. You have access to a nurse at all times for urgent questions. Please call the main number to the clinic Dept: [phone] and follow the prompts.   For any non-urgent questions, you may also contact your provider using MyChart. We now offer e-Visits for anyone 40 and older to request care online for non-urgent symptoms. For details visit [email or web address].   Also download the MyChart app! Go to the app store, search "MyChart", open the app, select [facility], and log in with your MyChart username and password.  Due to Covid, a mask is required upon entering the hospital/clinic. If you do not have a mask, one will be given to you upon arrival. For doctor visits, patients may have 1 support person aged 52 or older with them. For treatment visits, patients cannot have anyone with them due to current Covid guidelines and our immunocompromised population.   Oxaliplatin Injection What is this medication? OXALIPLATIN (ox AL i PLA tin) is a chemotherapy drug. It targets fast dividing cells, like cancer cells, and causes these cells to die. This medicine is usedto treat cancers of the colon and rectum, and many other cancers. This medicine may be used  for other purposes; ask your health care provider orpharmacist if you have questions. COMMON BRAND NAME(S): Eloxatin What should I tell my care team before I take this medication? They need to know if you have any of these conditions: heart disease history of irregular heartbeat liver disease low  blood counts, like white cells, platelets, or red blood cells lung or breathing disease, like asthma take medicines that treat or prevent blood clots tingling of the fingers or toes, or other nerve disorder an unusual or allergic reaction to oxaliplatin, other chemotherapy, other medicines, foods, dyes, or preservatives pregnant or trying to get pregnant breast-feeding How should I use this medication? This drug is given as an infusion into a vein. It is administered in a hospitalor clinic by a specially trained health care professional. Talk to your pediatrician regarding the use of this medicine in children.Special care may be needed. Overdosage: If you think you have taken too much of this medicine contact apoison control center or emergency room at once. NOTE: This medicine is only for you. Do not share this medicine with others. What if I miss a dose? It is important not to miss a dose. Call your doctor or health careprofessional if you are unable to keep an appointment. What may interact with this medication? Do not take this medicine with any of the following medications: cisapride dronedarone pimozide thioridazine This medicine may also interact with the following medications: aspirin and aspirin-like medicines certain medicines that treat or prevent blood clots like warfarin, apixaban, dabigatran, and rivaroxaban cisplatin cyclosporine diuretics medicines for infection like acyclovir, adefovir, amphotericin B, bacitracin, cidofovir, foscarnet, ganciclovir, gentamicin, pentamidine, vancomycin NSAIDs, medicines for pain and inflammation, like ibuprofen or naproxen other medicines that prolong the QT interval (an abnormal heart rhythm) pamidronate zoledronic acid This list may not describe all possible interactions. Give your health care provider a list of all the medicines, herbs, non-prescription drugs, or dietary supplements you use. Also tell them if you smoke, drink alcohol,  or use illegaldrugs. Some items may interact with your medicine. What should I watch for while using this medication? Your condition will be monitored carefully while you are receiving thismedicine. You may need blood work done while you are taking this medicine. This medicine may make you feel generally unwell. This is not uncommon as chemotherapy can affect healthy cells as well as cancer cells. Report any side effects. Continue your course of treatment even though you feel ill unless yourhealthcare professional tells you to stop. This medicine can make you more sensitive to cold. Do not drink cold drinks or use ice. Cover exposed skin before coming in contact with cold temperatures or cold objects. When out in cold weather wear warm clothing and cover your mouth and nose to warm the air that goes into your lungs. Tell your doctor if you getsensitive to the cold. Do not become pregnant while taking this medicine or for 9 months after stopping it. Women should inform their health care professional if they wish to become pregnant or think they might be pregnant. Men should not father a child while taking this medicine and for 6 months after stopping it. There is potential for serious side effects to an unborn child. Talk to your health careprofessional for more information. Do not breast-feed a child while taking this medicine or for 3 months afterstopping it. This medicine has caused ovarian failure in some women. This medicine may make it more difficult to get pregnant. Talk to your health care professional if  youare concerned about your fertility. This medicine has caused decreased sperm counts in some men. This may make it more difficult to father a child. Talk to your health care professional if [place] Sellers concerned about your fertility. This medicine may increase your risk of getting an infection. Call your health care professional for advice if you get a fever, chills, or sore throat, or other symptoms  of a cold or flu. Do not treat yourself. Try to avoid beingaround people who are sick. Avoid taking medicines that contain aspirin, acetaminophen, ibuprofen, naproxen, or ketoprofen unless instructed by your health care professional.These medicines may hide a fever. Be careful brushing or flossing your teeth or using a toothpick because you may get an infection or bleed more easily. If you have any dental work done, Primary school teacher you are receiving this medicine. What side effects may I notice from receiving this medication? Side effects that you should report to your doctor or health care professionalas soon as possible: allergic reactions like skin rash, itching or hives, swelling of the face, lips, or tongue breathing problems cough low blood counts - this medicine may decrease the number of white blood cells, red blood cells, and platelets. You may be at increased risk for infections and bleeding nausea, vomiting pain, redness, or irritation at site where injected pain, tingling, numbness in the hands or feet signs and symptoms of bleeding such as bloody or black, tarry stools; red or dark brown urine; spitting up blood or brown material that looks like coffee grounds; red spots on the skin; unusual bruising or bleeding from the eyes, gums, or nose signs and symptoms of a dangerous change in heartbeat or heart rhythm like chest pain; dizziness; fast, irregular heartbeat; palpitations; feeling faint or lightheaded; falls signs and symptoms of infection like fever; chills; cough; sore throat; pain or trouble passing urine signs and symptoms of liver injury like dark yellow or brown urine; general ill feeling or flu-like symptoms; light-colored stools; loss of appetite; nausea; right upper belly pain; unusually weak or tired; yellowing of the eyes or skin signs and symptoms of low red blood cells or anemia such as unusually weak or tired; feeling faint or lightheaded; falls signs and symptoms of  muscle injury like dark urine; trouble passing urine or change in the amount of urine; unusually weak or tired; muscle pain; back pain Side effects that usually do not require medical attention (report to yourdoctor or health care professional if they continue or are bothersome): changes in taste diarrhea gas hair loss loss of appetite mouth sores This list may not describe all possible side effects. Call your doctor for medical advice about side effects. You may report side effects to FDA at1-800-FDA-1088. Where should I keep my medication? This drug is given in a hospital or clinic and will not be stored at home. NOTE: This sheet is a summary. It may not cover all possible information. If you have questions about this medicine, talk to your doctor, pharmacist, orhealth care provider.  2022 Elsevier/Gold Standard (2018-09-25 12:20:35)  Leucovorin injection What is this medication? LEUCOVORIN (loo koe VOR in) is used to prevent or treat the harmful effects of some medicines. This medicine is used to treat anemia caused by a low amount of folic acid in the body. It is also used with 5-fluorouracil (5-FU) to treatcolon cancer. This medicine may be used for other purposes; ask your health care provider orpharmacist if you have questions. What should I tell my care team before I take  this medication? They need to know if you have any of these conditions: anemia from low levels of vitamin B-12 in the blood an unusual or allergic reaction to leucovorin, folic acid, other medicines, foods, dyes, or preservatives pregnant or trying to get pregnant breast-feeding How should I use this medication? This medicine is for injection into a muscle or into a vein. It is given by ahealth care professional in a hospital or clinic setting. Talk to your pediatrician regarding the use of this medicine in children.Special care may be needed. Overdosage: If you think you have taken too much of this medicine  contact apoison control center or emergency room at once. NOTE: This medicine is only for you. Do not share this medicine with others. What if I miss a dose? This does not apply. What may interact with this medication? capecitabine fluorouracil phenobarbital phenytoin primidone trimethoprim-sulfamethoxazole This list may not describe all possible interactions. Give your health care provider a list of all the medicines, herbs, non-prescription drugs, or dietary supplements you use. Also tell them if you smoke, drink alcohol, or use illegaldrugs. Some items may interact with your medicine. What should I watch for while using this medication? Your condition will be monitored carefully while you are receiving thismedicine. This medicine may increase the side effects of 5-fluorouracil, 5-FU. Tell your doctor or health care professional if you have diarrhea or mouth sores that donot get better or that get worse. What side effects may I notice from receiving this medication? Side effects that you should report to your doctor or health care professionalas soon as possible: allergic reactions like skin rash, itching or hives, swelling of the face, lips, or tongue breathing problems fever, infection mouth sores unusual bleeding or bruising unusually weak or tired Side effects that usually do not require medical attention (report to yourdoctor or health care professional if they continue or are bothersome): constipation or diarrhea loss of appetite nausea, vomiting This list may not describe all possible side effects. Call your doctor for medical advice about side effects. You may report side effects to FDA at1-800-FDA-1088. Where should I keep my medication? This drug is given in a hospital or clinic and will not be stored at home. NOTE: This sheet is a summary. It may not cover all possible information. If you have questions about this medicine, talk to your doctor, pharmacist, orhealth care  provider.  2022 Elsevier/Gold Standard (2007-11-12 16:50:29)  Fluorouracil, 5-FU injection What is this medication? FLUOROURACIL, 5-FU (flure oh YOOR a sil) is a chemotherapy drug. It slows the growth of cancer cells. This medicine is used to treat many types of cancer like breast cancer, colon or rectal cancer, pancreatic cancer, and stomachcancer. This medicine may be used for other purposes; ask your health care provider orpharmacist if you have questions. COMMON BRAND NAME(S): Adrucil What should I tell my care team before I take this medication? They need to know if you have any of these conditions: blood disorders dihydropyrimidine dehydrogenase (DPD) deficiency infection (especially a virus infection such as chickenpox, cold sores, or herpes) kidney disease liver disease malnourished, poor nutrition recent or ongoing radiation therapy an unusual or allergic reaction to fluorouracil, other chemotherapy, other medicines, foods, dyes, or preservatives pregnant or trying to get pregnant breast-feeding How should I use this medication? This drug is given as an infusion or injection into a vein. It is administeredin a hospital or clinic by a specially trained health care professional. Talk to your pediatrician regarding the use of this  medicine in children.Special care may be needed. Overdosage: If you think you have taken too much of this medicine contact apoison control center or emergency room at once. NOTE: This medicine is only for you. Do not share this medicine with others. What if I miss a dose? It is important not to miss your dose. Call your doctor or health careprofessional if you are unable to keep an appointment. What may interact with this medication? Do not take this medicine with any of the following medications: live virus vaccines This medicine may also interact with the following medications: medicines that treat or prevent blood clots like warfarin, enoxaparin,  and dalteparin This list may not describe all possible interactions. Give your health care provider a list of all the medicines, herbs, non-prescription drugs, or dietary supplements you use. Also tell them if you smoke, drink alcohol, or use illegaldrugs. Some items may interact with your medicine. What should I watch for while using this medication? Visit your doctor for checks on your progress. This drug may make you feel generally unwell. This is not uncommon, as chemotherapy can affect healthy cells as well as cancer cells. Report any side effects. Continue your course oftreatment even though you feel ill unless your doctor tells you to stop. In some cases, you may be given additional medicines to help with side effects.Follow all directions for their use. Call your doctor or health care professional for advice if you get a fever, chills or sore throat, or other symptoms of a cold or flu. Do not treat yourself. This drug decreases your body's ability to fight infections. Try toavoid being around people who are sick. This medicine may increase your risk to bruise or bleed. Call your doctor orhealth care professional if you notice any unusual bleeding. Be careful brushing and flossing your teeth or using a toothpick because you may get an infection or bleed more easily. If you have any dental work done,tell your dentist you are receiving this medicine. Avoid taking products that contain aspirin, acetaminophen, ibuprofen, naproxen, or ketoprofen unless instructed by your doctor. These medicines may hide afever. Do not become pregnant while taking this medicine. Women should inform their doctor if they wish to become pregnant or think they might be pregnant. There is a potential for serious side effects to an unborn child. Talk to your health care professional or pharmacist for more information. Do not breast-feed aninfant while taking this medicine. Men should inform their doctor if they wish to father a  child. This medicinemay lower sperm counts. Do not treat diarrhea with over the counter products. Contact your doctor ifyou have diarrhea that lasts more than 2 days or if it is severe and watery. This medicine can make you more sensitive to the sun. Keep out of the sun. If you cannot avoid being in the sun, wear protective clothing and use sunscreen.Do not use sun lamps or tanning beds/booths. What side effects may I notice from receiving this medication? Side effects that you should report to your doctor or health care professionalas soon as possible: allergic reactions like skin rash, itching or hives, swelling of the face, lips, or tongue low blood counts - this medicine may decrease the number of white blood cells, red blood cells and platelets. You may be at increased risk for infections and bleeding. signs of infection - fever or chills, cough, sore throat, pain or difficulty passing urine signs of decreased platelets or bleeding - bruising, pinpoint red spots on the skin, black, tarry  stools, blood in the urine signs of decreased red blood cells - unusually weak or tired, fainting spells, lightheadedness breathing problems changes in vision chest pain mouth sores nausea and vomiting pain, swelling, redness at site where injected pain, tingling, numbness in the hands or feet redness, swelling, or sores on hands or feet stomach pain unusual bleeding Side effects that usually do not require medical attention (report to yourdoctor or health care professional if they continue or are bothersome): changes in finger or toe nails diarrhea dry or itchy skin hair loss headache loss of appetite sensitivity of eyes to the light stomach upset unusually teary eyes This list may not describe all possible side effects. Call your doctor for medical advice about side effects. You may report side effects to FDA at1-800-FDA-1088. Where should I keep my medication? This drug is given in a hospital  or clinic and will not be stored at home. NOTE: This sheet is a summary. It may not cover all possible information. If you have questions about this medicine, talk to your doctor, pharmacist, orhealth care provider.  2022 Elsevier/Gold Standard (2019-04-08 15:00:03)

## 2020-11-08 NOTE — Progress Notes (Signed)
[name] OFFICE PROGRESS NOTE   Diagnosis: Colon cancer  INTERVAL HISTORY:   Jeffrey Young was last treated with FOLFOX on 10/11/2020.  He developed profound nausea, a left sided "mouth sore ", and anorexia following this cycle of chemotherapy.  Chemotherapy was held on 10/25/2020 secondary to dehydration.  He felt better when he was seen on 11/01/2020, but he had developed mild neutropenia.  Chemotherapy was held as insurance coverage for white cell growth factor could not be obtained that day.  Jeffrey Young reports feeling well at present.  No mouth sores, nausea, or diarrhea.  He has a good appetite.  He has not taken insulin today.  Objective:  Vital signs in last 24 hours:  There were no vitals taken for this visit.    HEENT: No thrush or ulcers Resp: Lungs clear bilaterally Cardio: Regular rate and rhythm GI: No hepatosplenomegaly Vascular: No leg edema  Skin: Mild hyperpigmentation of the hands  Portacath/PICC-without erythema  Lab Results:  Lab Results  Component Value Date   WBC 7.3 11/08/2020   HGB 9.5 (L) 11/08/2020   HCT 29.2 (L) 11/08/2020   MCV 95.1 11/08/2020   PLT 170 11/08/2020   NEUTROABS 4.4 11/08/2020    CMP  Lab Results  Component Value Date   NA 134 (L) 11/08/2020   K 4.2 11/08/2020   CL 98 11/08/2020   CO2 28 11/08/2020   GLUCOSE 391 (H) 11/08/2020   BUN 23 (H) 11/08/2020   CREATININE 1.77 (H) 11/08/2020   CALCIUM 9.2 11/08/2020   PROT 6.4 (L) 11/08/2020   ALBUMIN 3.3 (L) 11/08/2020   AST 23 11/08/2020   ALT 19 11/08/2020   ALKPHOS 146 (H) 11/08/2020   BILITOT 0.9 11/08/2020   GFRNONAA 44 (L) 11/08/2020   GFRAA >60 01/26/2018    Lab Results  Component Value Date   CEA1 11.44 (H) 09/27/2020    Medications: I have reviewed the patient's current medications.   Assessment/Plan:  1. Descending colon cancer, stage IIIc (T3N2b M0), status post a partial left colectomy 07/30/2020, 9/16 lymph nodes positive, lymphovascular  invasion, 1 satellite nodule, negative margins, MSS, no loss of mismatch repair protein expression -History of large polyp in the left side of the colon-referred to [facility] in 05/2018 for procedure canceled secondary to COVID-19 pandemic.  Procedure was not rescheduled. -CT chest/abdomen/pelvis with contrast 07/27/2020-3 small pulmonary nodules less than 5 mm favored to be benign, circumferential luminal narrowing of the distal transverse colon concerning for malignancy, no metastatic adenopathy in the mesentery porta hepatis, no for metastasis. -CEA on 07/27/2020 was 17.3; 37 on 08/30/2020; 33 on 09/13/2020 -Colonoscopy performed 07/27/2020 showed a fungating, infiltrative and ulcerated nonobstructing large mass in the proximal descending colon.  Biopsy-adenocarcinoma -Cycle 1 FOLFOX 08/30/2020 -Cycle 2 FOLFOX 09/13/2020, Emend added for delayed nausea -Cycle 3 FOLFOX 09/27/2020 -Cycle 4 FOLFOX 10/11/2020 -Cycle 5 FOLFOX 11/08/2020 2.  Anemia due to GI bleeding, iron deficiency?,  Renal insufficiency? 3.  New onset acute diastolic CHF March 4665 4.  Diabetes mellitus 5.  Renal insufficiency 6.  Hypertension 7.  History of left transmetatarsal amputation 8.  History of colon polyps 9.  Neuropathy    Disposition: Jeffrey Young appears well today.  The last cycle of chemotherapy was complicated by nausea and dehydration.  His symptoms have resolved.  He will complete another cycle of chemotherapy today.  The chemotherapy will be dose reduced based on his current weight.  The 5-FU bolus will be eliminated.  He knows to call  for nausea after this cycle of chemotherapy.  He will receive additional insulin at the Cancer center today.  I encouraged him to resume his long-acting insulin when he returns home.  Jeffrey Young will return for an office visit in the next cycle of chemotherapy in 2 weeks.  The CEA continues to decline.  I remain concerned he has metastatic colon cancer.  We will plan for a restaging CT  evaluation within the next 1-2 months.    Betsy Coder, MD  11/08/2020  10:22 AM

## 2020-11-08 NOTE — Progress Notes (Signed)
Faxed office note/labs of 11/08/20 to [facility] [phone]

## 2020-11-08 NOTE — Progress Notes (Signed)
Per Leander Rams, NP ok to treat with creatinine 1.77.  CBG 403 after 10 units of Novalog at 10:30 am.  Dr Benay Spice notified.

## 2020-11-10 ENCOUNTER — Inpatient Hospital Stay: Payer: 59

## 2020-11-10 ENCOUNTER — Other Ambulatory Visit: Payer: Self-pay

## 2020-11-10 VITALS — BP 147/89 | HR 85 | Temp 97.9°F | Resp 18

## 2020-11-10 DIAGNOSIS — Z5111 Encounter for antineoplastic chemotherapy: Secondary | ICD-10-CM | POA: Diagnosis not present

## 2020-11-10 DIAGNOSIS — C185 Malignant neoplasm of splenic flexure: Secondary | ICD-10-CM

## 2020-11-10 MED ORDER — HEPARIN SOD (PORK) LOCK FLUSH 100 UNIT/ML IV SOLN
500.0000 [IU] | Freq: Once | INTRAVENOUS | Status: AC | PRN
  Administered 2020-11-10: 500 [IU]
  Filled 2020-11-10: qty 5

## 2020-11-10 MED ORDER — PEGFILGRASTIM INJECTION 6 MG/0.6ML ~~LOC~~
6.0000 mg | PREFILLED_SYRINGE | Freq: Once | SUBCUTANEOUS | Status: AC
  Administered 2020-11-10: 6 mg via SUBCUTANEOUS

## 2020-11-10 MED ORDER — SODIUM CHLORIDE 0.9% FLUSH
10.0000 mL | INTRAVENOUS | Status: DC | PRN
  Administered 2020-11-10: 10 mL
  Filled 2020-11-10: qty 10

## 2020-11-15 ENCOUNTER — Ambulatory Visit: Payer: 59

## 2020-11-15 ENCOUNTER — Other Ambulatory Visit: Payer: 59

## 2020-11-15 ENCOUNTER — Inpatient Hospital Stay: Payer: 59 | Admitting: Oncology

## 2020-11-17 ENCOUNTER — Ambulatory Visit: Payer: 59

## 2020-11-21 ENCOUNTER — Other Ambulatory Visit: Payer: Self-pay | Admitting: Oncology

## 2020-11-23 ENCOUNTER — Inpatient Hospital Stay: Payer: 59 | Attending: Oncology

## 2020-11-23 ENCOUNTER — Other Ambulatory Visit: Payer: Self-pay

## 2020-11-23 ENCOUNTER — Inpatient Hospital Stay: Payer: 59

## 2020-11-23 ENCOUNTER — Inpatient Hospital Stay (HOSPITAL_BASED_OUTPATIENT_CLINIC_OR_DEPARTMENT_OTHER): Payer: 59 | Admitting: Nurse Practitioner

## 2020-11-23 ENCOUNTER — Encounter: Payer: Self-pay | Admitting: Nurse Practitioner

## 2020-11-23 VITALS — BP 128/87 | HR 81 | Temp 98.1°F | Resp 20 | Ht 73.0 in | Wt 194.6 lb

## 2020-11-23 DIAGNOSIS — K922 Gastrointestinal hemorrhage, unspecified: Secondary | ICD-10-CM | POA: Insufficient documentation

## 2020-11-23 DIAGNOSIS — Z8719 Personal history of other diseases of the digestive system: Secondary | ICD-10-CM | POA: Diagnosis not present

## 2020-11-23 DIAGNOSIS — Z9221 Personal history of antineoplastic chemotherapy: Secondary | ICD-10-CM | POA: Insufficient documentation

## 2020-11-23 DIAGNOSIS — I110 Hypertensive heart disease with heart failure: Secondary | ICD-10-CM | POA: Insufficient documentation

## 2020-11-23 DIAGNOSIS — C186 Malignant neoplasm of descending colon: Secondary | ICD-10-CM | POA: Diagnosis present

## 2020-11-23 DIAGNOSIS — C185 Malignant neoplasm of splenic flexure: Secondary | ICD-10-CM

## 2020-11-23 DIAGNOSIS — Z794 Long term (current) use of insulin: Secondary | ICD-10-CM | POA: Insufficient documentation

## 2020-11-23 DIAGNOSIS — Z5189 Encounter for other specified aftercare: Secondary | ICD-10-CM | POA: Diagnosis not present

## 2020-11-23 DIAGNOSIS — E1140 Type 2 diabetes mellitus with diabetic neuropathy, unspecified: Secondary | ICD-10-CM | POA: Diagnosis not present

## 2020-11-23 DIAGNOSIS — D500 Iron deficiency anemia secondary to blood loss (chronic): Secondary | ICD-10-CM | POA: Insufficient documentation

## 2020-11-23 DIAGNOSIS — Z5111 Encounter for antineoplastic chemotherapy: Secondary | ICD-10-CM | POA: Insufficient documentation

## 2020-11-23 DIAGNOSIS — Z79899 Other long term (current) drug therapy: Secondary | ICD-10-CM | POA: Diagnosis not present

## 2020-11-23 DIAGNOSIS — N289 Disorder of kidney and ureter, unspecified: Secondary | ICD-10-CM | POA: Diagnosis not present

## 2020-11-23 LAB — CBC WITH DIFFERENTIAL (CANCER CENTER ONLY)
Abs Immature Granulocytes: 0.44 10*3/uL — ABNORMAL HIGH (ref 0.00–0.07)
Basophils Absolute: 0.1 10*3/uL (ref 0.0–0.1)
Basophils Relative: 1 %
Eosinophils Absolute: 0.1 10*3/uL (ref 0.0–0.5)
Eosinophils Relative: 1 %
HCT: 28.9 % — ABNORMAL LOW (ref 39.0–52.0)
Hemoglobin: 9.7 g/dL — ABNORMAL LOW (ref 13.0–17.0)
Immature Granulocytes: 4 %
Lymphocytes Relative: 21 %
Lymphs Abs: 2.6 10*3/uL (ref 0.7–4.0)
MCH: 32.8 pg (ref 26.0–34.0)
MCHC: 33.6 g/dL (ref 30.0–36.0)
MCV: 97.6 fL (ref 80.0–100.0)
Monocytes Absolute: 1 10*3/uL (ref 0.1–1.0)
Monocytes Relative: 8 %
Neutro Abs: 8.3 10*3/uL — ABNORMAL HIGH (ref 1.7–7.7)
Neutrophils Relative %: 65 %
Platelet Count: 176 10*3/uL (ref 150–400)
RBC: 2.96 MIL/uL — ABNORMAL LOW (ref 4.22–5.81)
RDW: 16.9 % — ABNORMAL HIGH (ref 11.5–15.5)
WBC Count: 12.6 10*3/uL — ABNORMAL HIGH (ref 4.0–10.5)
nRBC: 0.6 % — ABNORMAL HIGH (ref 0.0–0.2)

## 2020-11-23 LAB — CMP (CANCER CENTER ONLY)
ALT: 13 U/L (ref 0–44)
AST: 17 U/L (ref 15–41)
Albumin: 3.3 g/dL — ABNORMAL LOW (ref 3.5–5.0)
Alkaline Phosphatase: 161 U/L — ABNORMAL HIGH (ref 38–126)
Anion gap: 7 (ref 5–15)
BUN: 18 mg/dL (ref 6–20)
CO2: 30 mmol/L (ref 22–32)
Calcium: 9 mg/dL (ref 8.9–10.3)
Chloride: 100 mmol/L (ref 98–111)
Creatinine: 1.58 mg/dL — ABNORMAL HIGH (ref 0.61–1.24)
GFR, Estimated: 51 mL/min — ABNORMAL LOW (ref >60)
Glucose, Bld: 316 mg/dL — ABNORMAL HIGH (ref 70–99)
Potassium: 3.5 mmol/L (ref 3.5–5.1)
Sodium: 137 mmol/L (ref 135–145)
Total Bilirubin: 0.6 mg/dL (ref 0.3–1.2)
Total Protein: 6 g/dL — ABNORMAL LOW (ref 6.5–8.1)

## 2020-11-23 LAB — CEA (ACCESS): CEA (CHCC): 8.09 ng/mL — ABNORMAL HIGH (ref 0.00–5.00)

## 2020-11-23 MED ORDER — SODIUM CHLORIDE 0.9 % IV SOLN
150.0000 mg | Freq: Once | INTRAVENOUS | Status: AC
  Administered 2020-11-23: 150 mg via INTRAVENOUS
  Filled 2020-11-23: qty 5

## 2020-11-23 MED ORDER — LEUCOVORIN CALCIUM INJECTION 350 MG
400.0000 mg/m2 | Freq: Once | INTRAMUSCULAR | Status: AC
  Administered 2020-11-23: 832 mg via INTRAVENOUS
  Filled 2020-11-23: qty 41.6

## 2020-11-23 MED ORDER — SODIUM CHLORIDE 0.9 % IV SOLN
2400.0000 mg/m2 | INTRAVENOUS | Status: DC
  Administered 2020-11-23: 5000 mg via INTRAVENOUS
  Filled 2020-11-23: qty 100

## 2020-11-23 MED ORDER — DEXTROSE 5 % IV SOLN
Freq: Once | INTRAVENOUS | Status: AC
  Filled 2020-11-23: qty 250

## 2020-11-23 MED ORDER — SODIUM CHLORIDE 0.9 % IV SOLN
10.0000 mg | Freq: Once | INTRAVENOUS | Status: AC
  Administered 2020-11-23: 10 mg via INTRAVENOUS
  Filled 2020-11-23: qty 1

## 2020-11-23 MED ORDER — SODIUM CHLORIDE 0.9% FLUSH
10.0000 mL | INTRAVENOUS | Status: DC | PRN
  Administered 2020-11-23: 10 mL
  Filled 2020-11-23: qty 10

## 2020-11-23 MED ORDER — PALONOSETRON HCL INJECTION 0.25 MG/5ML
0.2500 mg | Freq: Once | INTRAVENOUS | Status: AC
  Administered 2020-11-23: 0.25 mg via INTRAVENOUS
  Filled 2020-11-23: qty 5

## 2020-11-23 MED ORDER — OXALIPLATIN CHEMO INJECTION 100 MG/20ML
65.0000 mg/m2 | Freq: Once | INTRAVENOUS | Status: AC
  Administered 2020-11-23: 135 mg via INTRAVENOUS
  Filled 2020-11-23: qty 27

## 2020-11-23 NOTE — Patient Instructions (Signed)
[facility]  Discharge Instructions: Thank you for choosing [facility] to provide your oncology and hematology care.   If you have a lab appointment with the [facility], please go directly to the [facility] and check in at the registration area.   Wear comfortable clothing and clothing appropriate for easy access to any Portacath or PICC line.   We strive to give you quality time with your provider. You may need to reschedule your appointment if you arrive late (15 or more minutes).  Arriving late affects you and other patients whose appointments are after yours.  Also, if you miss three or more appointments without notifying the office, you may be dismissed from the clinic at the provider's discretion.      For prescription refill requests, have your pharmacy contact our office and allow 72 hours for refills to be completed.    Today you received the following chemotherapy and/or immunotherapy agents OXALIPLATIN, LEUCOVORIN, 5Fu      To help prevent nausea and vomiting after your treatment, we encourage you to take your nausea medication as directed.  BELOW ARE SYMPTOMS THAT SHOULD BE REPORTED IMMEDIATELY: *FEVER GREATER THAN 100.4 F (38 C) OR HIGHER *CHILLS OR SWEATING *NAUSEA AND VOMITING THAT IS NOT CONTROLLED WITH YOUR NAUSEA MEDICATION *UNUSUAL SHORTNESS OF BREATH *UNUSUAL BRUISING OR BLEEDING *URINARY PROBLEMS (pain or burning when urinating, or frequent urination) *BOWEL PROBLEMS (unusual diarrhea, constipation, pain near the anus) TENDERNESS IN MOUTH AND THROAT WITH OR WITHOUT PRESENCE OF ULCERS (sore throat, sores in mouth, or a toothache) UNUSUAL RASH, SWELLING OR PAIN  UNUSUAL VAGINAL DISCHARGE OR ITCHING   Items with * indicate a potential emergency and should be followed up as soon as possible or go to the Emergency Department if any problems should occur.  Please show the CHEMOTHERAPY ALERT CARD or IMMUNOTHERAPY ALERT CARD  at check-in to the Emergency Department and triage nurse.  Should you have questions after your visit or need to cancel or reschedule your appointment, please contact [facility]  Dept: [phone]  and follow the prompts.  Office hours are 8:00 a.m. to 4:30 p.m. Monday - Friday. Please note that voicemails left after 4:00 p.m. may not be returned until the following business day.  We are closed weekends and major holidays. You have access to a nurse at all times for urgent questions. Please call the main number to the clinic Dept: [phone] and follow the prompts.   For any non-urgent questions, you may also contact your provider using MyChart. We now offer e-Visits for anyone 34 and older to request care online for non-urgent symptoms. For details visit [email or web address].   Also download the MyChart app! Go to the app store, search "MyChart", open the app, select [facility], and log in with your MyChart username and password.  Due to Covid, a mask is required upon entering the hospital/clinic. If you do not have a mask, one will be given to you upon arrival. For doctor visits, patients may have 1 support person aged 37 or older with them. For treatment visits, patients cannot have anyone with them due to current Covid guidelines and our immunocompromised population.   Oxaliplatin Injection What is this medication? OXALIPLATIN (ox AL i PLA tin) is a chemotherapy drug. It targets fast dividing cells, like cancer cells, and causes these cells to die. This medicine is usedto treat cancers of the colon and rectum, and many other cancers. This medicine  may be used for other purposes; ask your health care provider orpharmacist if you have questions. COMMON BRAND NAME(S): Eloxatin What should I tell my care team before I take this medication? They need to know if you have any of these conditions: heart disease history of irregular heartbeat liver disease low blood  counts, like white cells, platelets, or red blood cells lung or breathing disease, like asthma take medicines that treat or prevent blood clots tingling of the fingers or toes, or other nerve disorder an unusual or allergic reaction to oxaliplatin, other chemotherapy, other medicines, foods, dyes, or preservatives pregnant or trying to get pregnant breast-feeding How should I use this medication? This drug is given as an infusion into a vein. It is administered in a hospitalor clinic by a specially trained health care professional. Talk to your pediatrician regarding the use of this medicine in children.Special care may be needed. Overdosage: If you think you have taken too much of this medicine contact apoison control center or emergency room at once. NOTE: This medicine is only for you. Do not share this medicine with others. What if I miss a dose? It is important not to miss a dose. Call your doctor or health careprofessional if you are unable to keep an appointment. What may interact with this medication? Do not take this medicine with any of the following medications: cisapride dronedarone pimozide thioridazine This medicine may also interact with the following medications: aspirin and aspirin-like medicines certain medicines that treat or prevent blood clots like warfarin, apixaban, dabigatran, and rivaroxaban cisplatin cyclosporine diuretics medicines for infection like acyclovir, adefovir, amphotericin B, bacitracin, cidofovir, foscarnet, ganciclovir, gentamicin, pentamidine, vancomycin NSAIDs, medicines for pain and inflammation, like ibuprofen or naproxen other medicines that prolong the QT interval (an abnormal heart rhythm) pamidronate zoledronic acid This list may not describe all possible interactions. Give your health care provider a list of all the medicines, herbs, non-prescription drugs, or dietary supplements you use. Also tell them if you smoke, drink alcohol, or  use illegaldrugs. Some items may interact with your medicine. What should I watch for while using this medication? Your condition will be monitored carefully while you are receiving thismedicine. You may need blood work done while you are taking this medicine. This medicine may make you feel generally unwell. This is not uncommon as chemotherapy can affect healthy cells as well as cancer cells. Report any side effects. Continue your course of treatment even though you feel ill unless yourhealthcare professional tells you to stop. This medicine can make you more sensitive to cold. Do not drink cold drinks or use ice. Cover exposed skin before coming in contact with cold temperatures or cold objects. When out in cold weather wear warm clothing and cover your mouth and nose to warm the air that goes into your lungs. Tell your doctor if you getsensitive to the cold. Do not become pregnant while taking this medicine or for 9 months after stopping it. Women should inform their health care professional if they wish to become pregnant or think they might be pregnant. Men should not father a child while taking this medicine and for 6 months after stopping it. There is potential for serious side effects to an unborn child. Talk to your health careprofessional for more information. Do not breast-feed a child while taking this medicine or for 3 months afterstopping it. This medicine has caused ovarian failure in some women. This medicine may make it more difficult to get pregnant. Talk to your health  care professional if [place] concerned about your fertility. This medicine has caused decreased sperm counts in some men. This may make it more difficult to father a child. Talk to your health care professional if [place] concerned about your fertility. This medicine may increase your risk of getting an infection. Call your health care professional for advice if you get a fever, chills, or sore throat, or other symptoms of  a cold or flu. Do not treat yourself. Try to avoid beingaround people who are sick. Avoid taking medicines that contain aspirin, acetaminophen, ibuprofen, naproxen, or ketoprofen unless instructed by your health care professional.These medicines may hide a fever. Be careful brushing or flossing your teeth or using a toothpick because you may get an infection or bleed more easily. If you have any dental work done, Primary school teacher you are receiving this medicine. What side effects may I notice from receiving this medication? Side effects that you should report to your doctor or health care professionalas soon as possible: allergic reactions like skin rash, itching or hives, swelling of the face, lips, or tongue breathing problems cough low blood counts - this medicine may decrease the number of white blood cells, red blood cells, and platelets. You may be at increased risk for infections and bleeding nausea, vomiting pain, redness, or irritation at site where injected pain, tingling, numbness in the hands or feet signs and symptoms of bleeding such as bloody or black, tarry stools; red or dark brown urine; spitting up blood or brown material that looks like coffee grounds; red spots on the skin; unusual bruising or bleeding from the eyes, gums, or nose signs and symptoms of a dangerous change in heartbeat or heart rhythm like chest pain; dizziness; fast, irregular heartbeat; palpitations; feeling faint or lightheaded; falls signs and symptoms of infection like fever; chills; cough; sore throat; pain or trouble passing urine signs and symptoms of liver injury like dark yellow or brown urine; general ill feeling or flu-like symptoms; light-colored stools; loss of appetite; nausea; right upper belly pain; unusually weak or tired; yellowing of the eyes or skin signs and symptoms of low red blood cells or anemia such as unusually weak or tired; feeling faint or lightheaded; falls signs and symptoms of  muscle injury like dark urine; trouble passing urine or change in the amount of urine; unusually weak or tired; muscle pain; back pain Side effects that usually do not require medical attention (report to yourdoctor or health care professional if they continue or are bothersome): changes in taste diarrhea gas hair loss loss of appetite mouth sores This list may not describe all possible side effects. Call your doctor for medical advice about side effects. You may report side effects to FDA at1-800-FDA-1088. Where should I keep my medication? This drug is given in a hospital or clinic and will not be stored at home. NOTE: This sheet is a summary. It may not cover all possible information. If you have questions about this medicine, talk to your doctor, pharmacist, orhealth care provider.  2022 Elsevier/Gold Standard (2018-09-25 12:20:35)  Leucovorin injection What is this medication? LEUCOVORIN (loo koe VOR in) is used to prevent or treat the harmful effects of some medicines. This medicine is used to treat anemia caused by a low amount of folic acid in the body. It is also used with 5-fluorouracil (5-FU) to treatcolon cancer. This medicine may be used for other purposes; ask your health care provider orpharmacist if you have questions. What should I tell my care team  before I take this medication? They need to know if you have any of these conditions: anemia from low levels of vitamin B-12 in the blood an unusual or allergic reaction to leucovorin, folic acid, other medicines, foods, dyes, or preservatives pregnant or trying to get pregnant breast-feeding How should I use this medication? This medicine is for injection into a muscle or into a vein. It is given by ahealth care professional in a hospital or clinic setting. Talk to your pediatrician regarding the use of this medicine in children.Special care may be needed. Overdosage: If you think you have taken too much of this medicine  contact apoison control center or emergency room at once. NOTE: This medicine is only for you. Do not share this medicine with others. What if I miss a dose? This does not apply. What may interact with this medication? capecitabine fluorouracil phenobarbital phenytoin primidone trimethoprim-sulfamethoxazole This list may not describe all possible interactions. Give your health care provider a list of all the medicines, herbs, non-prescription drugs, or dietary supplements you use. Also tell them if you smoke, drink alcohol, or use illegaldrugs. Some items may interact with your medicine. What should I watch for while using this medication? Your condition will be monitored carefully while you are receiving thismedicine. This medicine may increase the side effects of 5-fluorouracil, 5-FU. Tell your doctor or health care professional if you have diarrhea or mouth sores that donot get better or that get worse. What side effects may I notice from receiving this medication? Side effects that you should report to your doctor or health care professionalas soon as possible: allergic reactions like skin rash, itching or hives, swelling of the face, lips, or tongue breathing problems fever, infection mouth sores unusual bleeding or bruising unusually weak or tired Side effects that usually do not require medical attention (report to yourdoctor or health care professional if they continue or are bothersome): constipation or diarrhea loss of appetite nausea, vomiting This list may not describe all possible side effects. Call your doctor for medical advice about side effects. You may report side effects to FDA at1-800-FDA-1088. Where should I keep my medication? This drug is given in a hospital or clinic and will not be stored at home. NOTE: This sheet is a summary. It may not cover all possible information. If you have questions about this medicine, talk to your doctor, pharmacist, orhealth care  provider.  2022 Elsevier/Gold Standard (2007-11-12 16:50:29)  Fluorouracil, 5-FU injection What is this medication? FLUOROURACIL, 5-FU (flure oh YOOR a sil) is a chemotherapy drug. It slows the growth of cancer cells. This medicine is used to treat many types of cancer like breast cancer, colon or rectal cancer, pancreatic cancer, and stomachcancer. This medicine may be used for other purposes; ask your health care provider orpharmacist if you have questions. COMMON BRAND NAME(S): Adrucil What should I tell my care team before I take this medication? They need to know if you have any of these conditions: blood disorders dihydropyrimidine dehydrogenase (DPD) deficiency infection (especially a virus infection such as chickenpox, cold sores, or herpes) kidney disease liver disease malnourished, poor nutrition recent or ongoing radiation therapy an unusual or allergic reaction to fluorouracil, other chemotherapy, other medicines, foods, dyes, or preservatives pregnant or trying to get pregnant breast-feeding How should I use this medication? This drug is given as an infusion or injection into a vein. It is administeredin a hospital or clinic by a specially trained health care professional. Talk to your pediatrician regarding the  use of this medicine in children.Special care may be needed. Overdosage: If you think you have taken too much of this medicine contact apoison control center or emergency room at once. NOTE: This medicine is only for you. Do not share this medicine with others. What if I miss a dose? It is important not to miss your dose. Call your doctor or health careprofessional if you are unable to keep an appointment. What may interact with this medication? Do not take this medicine with any of the following medications: live virus vaccines This medicine may also interact with the following medications: medicines that treat or prevent blood clots like warfarin, enoxaparin,  and dalteparin This list may not describe all possible interactions. Give your health care provider a list of all the medicines, herbs, non-prescription drugs, or dietary supplements you use. Also tell them if you smoke, drink alcohol, or use illegaldrugs. Some items may interact with your medicine. What should I watch for while using this medication? Visit your doctor for checks on your progress. This drug may make you feel generally unwell. This is not uncommon, as chemotherapy can affect healthy cells as well as cancer cells. Report any side effects. Continue your course oftreatment even though you feel ill unless your doctor tells you to stop. In some cases, you may be given additional medicines to help with side effects.Follow all directions for their use. Call your doctor or health care professional for advice if you get a fever, chills or sore throat, or other symptoms of a cold or flu. Do not treat yourself. This drug decreases your body's ability to fight infections. Try toavoid being around people who are sick. This medicine may increase your risk to bruise or bleed. Call your doctor orhealth care professional if you notice any unusual bleeding. Be careful brushing and flossing your teeth or using a toothpick because you may get an infection or bleed more easily. If you have any dental work done,tell your dentist you are receiving this medicine. Avoid taking products that contain aspirin, acetaminophen, ibuprofen, naproxen, or ketoprofen unless instructed by your doctor. These medicines may hide afever. Do not become pregnant while taking this medicine. Women should inform their doctor if they wish to become pregnant or think they might be pregnant. There is a potential for serious side effects to an unborn child. Talk to your health care professional or pharmacist for more information. Do not breast-feed aninfant while taking this medicine. Men should inform their doctor if they wish to father a  child. This medicinemay lower sperm counts. Do not treat diarrhea with over the counter products. Contact your doctor ifyou have diarrhea that lasts more than 2 days or if it is severe and watery. This medicine can make you more sensitive to the sun. Keep out of the sun. If you cannot avoid being in the sun, wear protective clothing and use sunscreen.Do not use sun lamps or tanning beds/booths. What side effects may I notice from receiving this medication? Side effects that you should report to your doctor or health care professionalas soon as possible: allergic reactions like skin rash, itching or hives, swelling of the face, lips, or tongue low blood counts - this medicine may decrease the number of white blood cells, red blood cells and platelets. You may be at increased risk for infections and bleeding. signs of infection - fever or chills, cough, sore throat, pain or difficulty passing urine signs of decreased platelets or bleeding - bruising, pinpoint red spots on the  skin, black, tarry stools, blood in the urine signs of decreased red blood cells - unusually weak or tired, fainting spells, lightheadedness breathing problems changes in vision chest pain mouth sores nausea and vomiting pain, swelling, redness at site where injected pain, tingling, numbness in the hands or feet redness, swelling, or sores on hands or feet stomach pain unusual bleeding Side effects that usually do not require medical attention (report to yourdoctor or health care professional if they continue or are bothersome): changes in finger or toe nails diarrhea dry or itchy skin hair loss headache loss of appetite sensitivity of eyes to the light stomach upset unusually teary eyes This list may not describe all possible side effects. Call your doctor for medical advice about side effects. You may report side effects to FDA at1-800-FDA-1088. Where should I keep my medication? This drug is given in a hospital  or clinic and will not be stored at home. NOTE: This sheet is a summary. It may not cover all possible information. If you have questions about this medicine, talk to your doctor, pharmacist, orhealth care provider.  2022 Elsevier/Gold Standard (2019-04-08 15:00:03)

## 2020-11-23 NOTE — Progress Notes (Signed)
  [facility] OFFICE PROGRESS NOTE   Diagnosis: Colon cancer  INTERVAL HISTORY:   Jeffrey Young returns as scheduled.  He completed cycle 5 FOLFOX 11/08/2020.  No significant nausea/vomiting.  No mouth sores.  No diarrhea.  Cold sensitivity lasted 4 to 5 days.  No persistent neuropathy symptoms.  No abdominal pain.  He reports a good appetite.  Objective:  Vital signs in last 24 hours:  Blood pressure 128/87, pulse 81, temperature 98.1 F (36.7 C), temperature source Oral, resp. rate 20, height $RemoveBe'6\' 1"'PkISbssEc$  (1.854 m), weight 194 lb 9.6 oz (88.3 kg), SpO2 100 %.    HEENT: White coating over tongue.  No buccal thrush.  No ulcers. Resp: Lungs clear bilaterally. Cardio: Regular rate and rhythm. GI: Abdomen soft and nontender.  No hepatomegaly. Vascular: No leg edema. Neuro: Vibratory sense moderately decreased over the fingertips per tuning fork exam. Skin: Palms without erythema.  Scattered areas of hyperpigmentation over the palms. Port-A-Cath without erythema.   Lab Results:  Lab Results  Component Value Date   WBC 12.6 (H) 11/23/2020   HGB 9.7 (L) 11/23/2020   HCT 28.9 (L) 11/23/2020   MCV 97.6 11/23/2020   PLT 176 11/23/2020   NEUTROABS 8.3 (H) 11/23/2020    Imaging:  No results found.  Medications: I have reviewed the patient's current medications.  Assessment/Plan: 1. Descending colon cancer, stage IIIc (T3N2b M0), status post a partial left colectomy 07/30/2020, 9/16 lymph nodes positive, lymphovascular invasion, 1 satellite nodule, negative margins, MSS, no loss of mismatch repair protein expression -History of large polyp in the left side of the colon-referred to [facility] in 05/2018 for procedure canceled secondary to COVID-19 pandemic.  Procedure was not rescheduled. -CT chest/abdomen/pelvis with contrast 07/27/2020-3 small pulmonary nodules less than 5 mm favored to be benign, circumferential luminal narrowing of the distal transverse colon concerning for  malignancy, no metastatic adenopathy in the mesentery porta hepatis, no for metastasis. -CEA on 07/27/2020 was 17.3; 37 on 08/30/2020; 33 on 09/13/2020 -Colonoscopy performed 07/27/2020 showed a fungating, infiltrative and ulcerated nonobstructing large mass in the proximal descending colon.  Biopsy-adenocarcinoma -Cycle 1 FOLFOX 08/30/2020 -Cycle 2 FOLFOX 09/13/2020, Emend added for delayed nausea -Cycle 3 FOLFOX 09/27/2020 -Cycle 4 FOLFOX 10/11/2020 -Cycle 5 FOLFOX 11/08/2020 -Cycle 6 FOLFOX 11/23/2020 2.  Anemia due to GI bleeding, iron deficiency?,  Renal insufficiency? 3.  New onset acute diastolic CHF March 0388 4.  Diabetes mellitus 5.  Renal insufficiency 6.  Hypertension 7.  History of left transmetatarsal amputation 8.  History of colon polyps 9.  Neuropathy  Disposition: Jeffrey Young appears stable.  He has completed 5 cycles of FOLFOX.  Plan to proceed with cycle 6 today as scheduled.  Plan for CT scans prior to next office visit.  We reviewed the CBC and chemistry panel from today.  Labs are adequate to proceed with treatment.  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  We will continue to monitor.  He will return for lab, follow-up, FOLFOX in 2 weeks.  He will contact the office in the interim with any problems.  Plan reviewed with Dr. Benay Spice.   Ned Card ANP/GNP-BC   11/23/2020  9:16 AM

## 2020-11-24 ENCOUNTER — Telehealth: Payer: Self-pay | Admitting: *Deleted

## 2020-11-24 NOTE — Telephone Encounter (Signed)
Called wife w/CT scan appointment at [facility] on 12/03/20 w/arrival at [place] for scan at 0900. NPO x 4 hours and oral contrast at 0700 and 0800. She understands and agrees.

## 2020-11-25 ENCOUNTER — Other Ambulatory Visit: Payer: Self-pay

## 2020-11-25 ENCOUNTER — Inpatient Hospital Stay: Payer: 59

## 2020-11-25 VITALS — BP 158/93 | HR 80 | Resp 20

## 2020-11-25 DIAGNOSIS — C185 Malignant neoplasm of splenic flexure: Secondary | ICD-10-CM

## 2020-11-25 DIAGNOSIS — Z5111 Encounter for antineoplastic chemotherapy: Secondary | ICD-10-CM | POA: Diagnosis not present

## 2020-11-25 MED ORDER — SODIUM CHLORIDE 0.9% FLUSH
10.0000 mL | INTRAVENOUS | Status: DC | PRN
  Administered 2020-11-25: 10 mL
  Filled 2020-11-25: qty 10

## 2020-11-25 MED ORDER — PEGFILGRASTIM-BMEZ 6 MG/0.6ML ~~LOC~~ SOSY
6.0000 mg | PREFILLED_SYRINGE | Freq: Once | SUBCUTANEOUS | Status: AC
  Administered 2020-11-25: 6 mg via SUBCUTANEOUS

## 2020-11-25 MED ORDER — HEPARIN SOD (PORK) LOCK FLUSH 100 UNIT/ML IV SOLN
500.0000 [IU] | Freq: Once | INTRAVENOUS | Status: AC | PRN
  Administered 2020-11-25: 500 [IU]
  Filled 2020-11-25: qty 5

## 2020-11-25 NOTE — Patient Instructions (Signed)
[facility]  Discharge Instructions: Thank you for choosing [facility] to provide your oncology and hematology care.   If you have a lab appointment with the [facility], please go directly to the [facility] and check in at the registration area.   Wear comfortable clothing and clothing appropriate for easy access to any Portacath or PICC line.   We strive to give you quality time with your provider. You may need to reschedule your appointment if you arrive late (15 or more minutes).  Arriving late affects you and other patients whose appointments are after yours.  Also, if you miss three or more appointments without notifying the office, you may be dismissed from the clinic at the provider's discretion.      For prescription refill requests, have your pharmacy contact our office and allow 72 hours for refills to be completed.    Today you received the following chemotherapy and/or immunotherapy agents Ziextenzio      To help prevent nausea and vomiting after your treatment, we encourage you to take your nausea medication as directed.  BELOW ARE SYMPTOMS THAT SHOULD BE REPORTED IMMEDIATELY: *FEVER GREATER THAN 100.4 F (38 C) OR HIGHER *CHILLS OR SWEATING *NAUSEA AND VOMITING THAT IS NOT CONTROLLED WITH YOUR NAUSEA MEDICATION *UNUSUAL SHORTNESS OF BREATH *UNUSUAL BRUISING OR BLEEDING *URINARY PROBLEMS (pain or burning when urinating, or frequent urination) *BOWEL PROBLEMS (unusual diarrhea, constipation, pain near the anus) TENDERNESS IN MOUTH AND THROAT WITH OR WITHOUT PRESENCE OF ULCERS (sore throat, sores in mouth, or a toothache) UNUSUAL RASH, SWELLING OR PAIN  UNUSUAL VAGINAL DISCHARGE OR ITCHING   Items with * indicate a potential emergency and should be followed up as soon as possible or go to the Emergency Department if any problems should occur.  Please show the CHEMOTHERAPY ALERT CARD or IMMUNOTHERAPY ALERT CARD at check-in to the  Emergency Department and triage nurse.  Should you have questions after your visit or need to cancel or reschedule your appointment, please contact [facility]  Dept: [phone]  and follow the prompts.  Office hours are 8:00 a.m. to 4:30 p.m. Monday - Friday. Please note that voicemails left after 4:00 p.m. may not be returned until the following business day.  We are closed weekends and major holidays. You have access to a nurse at all times for urgent questions. Please call the main number to the clinic Dept: [phone] and follow the prompts.   For any non-urgent questions, you may also contact your provider using MyChart. We now offer e-Visits for anyone 56 and older to request care online for non-urgent symptoms. For details visit [email or web address].   Also download the MyChart app! Go to the app store, search "MyChart", open the app, select [facility], and log in with your MyChart username and password.  Due to Covid, a mask is required upon entering the hospital/clinic. If you do not have a mask, one will be given to you upon arrival. For doctor visits, patients may have 1 support person aged 18 or older with them. For treatment visits, patients cannot have anyone with them due to current Covid guidelines and our immunocompromised population.

## 2020-11-26 ENCOUNTER — Encounter: Payer: Self-pay | Admitting: *Deleted

## 2020-11-26 NOTE — Progress Notes (Signed)
Faxed 11/23/20 office note/labs to [facility] #[phone]

## 2020-12-02 ENCOUNTER — Other Ambulatory Visit: Payer: Self-pay

## 2020-12-02 DIAGNOSIS — C185 Malignant neoplasm of splenic flexure: Secondary | ICD-10-CM

## 2020-12-02 NOTE — Progress Notes (Signed)
Lab orders placed.  

## 2020-12-03 ENCOUNTER — Other Ambulatory Visit: Payer: Self-pay | Admitting: Nurse Practitioner

## 2020-12-03 ENCOUNTER — Other Ambulatory Visit: Payer: Self-pay

## 2020-12-03 ENCOUNTER — Ambulatory Visit (HOSPITAL_BASED_OUTPATIENT_CLINIC_OR_DEPARTMENT_OTHER)
Admission: RE | Admit: 2020-12-03 | Discharge: 2020-12-03 | Disposition: A | Discharge status: Home / Self Care | Payer: 59 | Source: Ambulatory Visit | Attending: Nurse Practitioner | Admitting: Nurse Practitioner

## 2020-12-03 DIAGNOSIS — C185 Malignant neoplasm of splenic flexure: Secondary | ICD-10-CM

## 2020-12-05 ENCOUNTER — Other Ambulatory Visit: Payer: Self-pay | Admitting: Oncology

## 2020-12-06 ENCOUNTER — Inpatient Hospital Stay: Payer: 59

## 2020-12-06 ENCOUNTER — Inpatient Hospital Stay: Payer: 59 | Admitting: Oncology

## 2020-12-06 ENCOUNTER — Other Ambulatory Visit: Payer: Self-pay | Admitting: *Deleted

## 2020-12-06 ENCOUNTER — Other Ambulatory Visit: Payer: Self-pay

## 2020-12-06 ENCOUNTER — Inpatient Hospital Stay (HOSPITAL_BASED_OUTPATIENT_CLINIC_OR_DEPARTMENT_OTHER): Payer: 59 | Admitting: Oncology

## 2020-12-06 VITALS — BP 153/91 | HR 79 | Temp 99.1°F | Resp 20 | Ht 73.0 in | Wt 193.0 lb

## 2020-12-06 DIAGNOSIS — C185 Malignant neoplasm of splenic flexure: Secondary | ICD-10-CM

## 2020-12-06 DIAGNOSIS — Z5111 Encounter for antineoplastic chemotherapy: Secondary | ICD-10-CM | POA: Diagnosis not present

## 2020-12-06 LAB — CBC WITH DIFFERENTIAL (CANCER CENTER ONLY)
Abs Immature Granulocytes: 0.79 10*3/uL — ABNORMAL HIGH (ref 0.00–0.07)
Basophils Absolute: 0.1 10*3/uL (ref 0.0–0.1)
Basophils Relative: 1 %
Eosinophils Absolute: 0.1 10*3/uL (ref 0.0–0.5)
Eosinophils Relative: 1 %
HCT: 30.2 % — ABNORMAL LOW (ref 39.0–52.0)
Hemoglobin: 9.8 g/dL — ABNORMAL LOW (ref 13.0–17.0)
Immature Granulocytes: 4 %
Lymphocytes Relative: 12 %
Lymphs Abs: 2.2 10*3/uL (ref 0.7–4.0)
MCH: 32.5 pg (ref 26.0–34.0)
MCHC: 32.5 g/dL (ref 30.0–36.0)
MCV: 100 fL (ref 80.0–100.0)
Monocytes Absolute: 1.4 10*3/uL — ABNORMAL HIGH (ref 0.1–1.0)
Monocytes Relative: 8 %
Neutro Abs: 13.3 10*3/uL — ABNORMAL HIGH (ref 1.7–7.7)
Neutrophils Relative %: 74 %
Platelet Count: 195 10*3/uL (ref 150–400)
RBC: 3.02 MIL/uL — ABNORMAL LOW (ref 4.22–5.81)
RDW: 15.2 % (ref 11.5–15.5)
WBC Count: 17.9 10*3/uL — ABNORMAL HIGH (ref 4.0–10.5)
nRBC: 0.2 % (ref 0.0–0.2)

## 2020-12-06 LAB — CMP (CANCER CENTER ONLY)
ALT: 12 U/L (ref 0–44)
AST: 19 U/L (ref 15–41)
Albumin: 3.2 g/dL — ABNORMAL LOW (ref 3.5–5.0)
Alkaline Phosphatase: 186 U/L — ABNORMAL HIGH (ref 38–126)
Anion gap: 9 (ref 5–15)
BUN: 15 mg/dL (ref 6–20)
CO2: 29 mmol/L (ref 22–32)
Calcium: 8.3 mg/dL — ABNORMAL LOW (ref 8.9–10.3)
Chloride: 102 mmol/L (ref 98–111)
Creatinine: 1.36 mg/dL — ABNORMAL HIGH (ref 0.61–1.24)
GFR, Estimated: >60 mL/min (ref >60)
Glucose, Bld: 201 mg/dL — ABNORMAL HIGH (ref 70–99)
Potassium: 3.3 mmol/L — ABNORMAL LOW (ref 3.5–5.1)
Sodium: 140 mmol/L (ref 135–145)
Total Bilirubin: 0.5 mg/dL (ref 0.3–1.2)
Total Protein: 6 g/dL — ABNORMAL LOW (ref 6.5–8.1)

## 2020-12-06 MED ORDER — OXALIPLATIN CHEMO INJECTION 100 MG/20ML
65.0000 mg/m2 | Freq: Once | INTRAVENOUS | Status: AC
  Administered 2020-12-06: 135 mg via INTRAVENOUS
  Filled 2020-12-06: qty 20

## 2020-12-06 MED ORDER — DEXTROSE 5 % IV SOLN
Freq: Once | INTRAVENOUS | Status: AC
Start: 2020-12-06 — End: 2020-12-06
  Filled 2020-12-06: qty 250

## 2020-12-06 MED ORDER — DEXAMETHASONE 4 MG PO TABS: 4.0000 mg | ORAL_TABLET | Freq: Two times a day (BID) | ORAL | 0 refills | Status: DC

## 2020-12-06 MED ORDER — DEXTROSE 5 % IV SOLN
Freq: Once | INTRAVENOUS | Status: AC
  Filled 2020-12-06: qty 250

## 2020-12-06 MED ORDER — FLUOROURACIL CHEMO INJECTION 5 GM/100ML
2400.0000 mg/m2 | INTRAVENOUS | Status: DC
  Administered 2020-12-06: 5000 mg via INTRAVENOUS
  Filled 2020-12-06: qty 100

## 2020-12-06 MED ORDER — SODIUM CHLORIDE 0.9 % IV SOLN
10.0000 mg | Freq: Once | INTRAVENOUS | Status: AC
  Administered 2020-12-06: 10 mg via INTRAVENOUS
  Filled 2020-12-06: qty 10

## 2020-12-06 MED ORDER — SODIUM CHLORIDE 0.9 % IV SOLN
150.0000 mg | Freq: Once | INTRAVENOUS | Status: AC
  Administered 2020-12-06: 150 mg via INTRAVENOUS
  Filled 2020-12-06: qty 150

## 2020-12-06 MED ORDER — LEUCOVORIN CALCIUM INJECTION 350 MG
400.0000 mg/m2 | Freq: Once | INTRAVENOUS | Status: AC
  Administered 2020-12-06: 832 mg via INTRAVENOUS
  Filled 2020-12-06: qty 41.6

## 2020-12-06 MED ORDER — PALONOSETRON HCL INJECTION 0.25 MG/5ML
0.2500 mg | Freq: Once | INTRAVENOUS | Status: AC
Start: 2020-12-06 — End: 2020-12-06
  Administered 2020-12-06: 0.25 mg via INTRAVENOUS
  Filled 2020-12-06: qty 5

## 2020-12-06 NOTE — Progress Notes (Signed)
[facility] OFFICE PROGRESS NOTE   Diagnosis: Colon cancer  INTERVAL HISTORY:   Mr. Hopkin completed another cycle of FOLFOX on 11/23/2020.  He reports 2 episodes of nausea and vomiting on day 4.  He had nausea beginning on day 3.  He had 2 episodes of diarrhea on day 6.  He reports 1 episode of cold sensitivity.  No neuropathy symptoms at present.  His blood sugar has been running in the 170 range.  Objective:  Vital signs in last 24 hours:  Blood pressure (!) 153/91, pulse 79, temperature 99.1 F (37.3 C), resp. rate 20, height $RemoveBe'6\' 1"'uxXGYleFm$  (1.854 m), weight 193 lb (87.5 kg).    HEENT: No thrush or ulcers Resp: Lungs clear bilaterally Cardio: Regular rate and rhythm GI: No hepatosplenomegaly Vascular: No leg edema Neuro: Mild to moderate loss of vibratory sense at the fingertips bilaterally Skin: Palms without erythema  Portacath/PICC-without erythema  Lab Results:  Lab Results  Component Value Date   WBC 17.9 (H) 12/06/2020   HGB 9.8 (L) 12/06/2020   HCT 30.2 (L) 12/06/2020   MCV 100.0 12/06/2020   PLT 195 12/06/2020   NEUTROABS 13.3 (H) 12/06/2020    CMP  Lab Results  Component Value Date   NA 140 12/06/2020   K 3.3 (L) 12/06/2020   CL 102 12/06/2020   CO2 29 12/06/2020   GLUCOSE 201 (H) 12/06/2020   BUN 15 12/06/2020   CREATININE 1.36 (H) 12/06/2020   CALCIUM 8.3 (L) 12/06/2020   PROT 6.0 (L) 12/06/2020   ALBUMIN 3.2 (L) 12/06/2020   AST 19 12/06/2020   ALT 12 12/06/2020   ALKPHOS 186 (H) 12/06/2020   BILITOT 0.5 12/06/2020   GFRNONAA >60 12/06/2020   GFRAA >60 01/26/2018    Lab Results  Component Value Date   CEA1 11.44 (H) 09/27/2020   CEA 8.09 (H) 11/23/2020    No results found for: INR, LABPROT  Imaging:  CT CHEST ABDOMEN PELVIS WO CONTRAST  Result Date: 12/04/2020 CLINICAL DATA:  Follow-up splenic flexure colon cancer EXAM: CT CHEST, ABDOMEN AND PELVIS WITHOUT CONTRAST TECHNIQUE: Multidetector CT imaging of the chest, abdomen  and pelvis was performed following the standard protocol without IV contrast. COMPARISON:  07/27/2020 FINDINGS: CT CHEST FINDINGS Cardiovascular: Heart is normal in size.  No pericardial effusion. No evidence of thoracic aortic aneurysm. Left chest port terminates cavoatrial junction. Mediastinum/Nodes: No suspicious mediastinal lymphadenopathy. Visualized thyroid is unremarkable. Lungs/Pleura: Nodular scarring in the medial right lung apex measures 5 mm (series 4/image 22), unchanged when remeasured in a similar fashion, benign. Prior 3 mm left apical nodule is no longer visualized. 3 mm subpleural nodule in the right middle lobe (series 4/image 37), unchanged, benign. No new/suspicious pulmonary nodules. Linear scarring/atelectasis in the lingula and left lower lobe. No focal consolidation. No pleural effusion or pneumothorax. Musculoskeletal: Mild degenerative changes of the mid/lower thoracic spine. CT ABDOMEN PELVIS FINDINGS Hepatobiliary: Unenhanced liver is unremarkable. Gallbladder is unremarkable. No intrahepatic or extrahepatic ductal dilatation. Pancreas: Within normal limits. Spleen: Within normal limits. Adrenals/Urinary Tract: Adrenal glands are within normal limits. 1.9 cm cyst in the posterior right lower kidney (series 2/image 69). Left kidney is within normal limits. No renal calculi or hydronephrosis. Bladder is within normal limits. Stomach/Bowel: Stomach is within normal limits. No evidence of bowel obstruction. Normal appendix (series 2/image 102). Status post left hemicolectomy with suture line in the left upper abdomen (series 2/image 78). Vascular/Lymphatic: No evidence of abdominal aortic aneurysm. No suspicious abdominopelvic lymphadenopathy. Reproductive: Prostate  is unremarkable. Other: No abdominopelvic ascites. Tiny fat containing right inguinal hernia. Minimal fat in the left inguinal canal. Musculoskeletal: Visualized osseous structures are within normal limits. IMPRESSION: Status  post left hemicolectomy. No evidence of recurrent or metastatic disease. Stable small pulmonary nodules, benign. Electronically Signed   By: [facility] Hy M.D.   On: 12/04/2020 21:15    Medications: I have reviewed the patient's current medications.   Assessment/Plan: Descending colon cancer, stage IIIc (T3N2b M0), status post a partial left colectomy 07/30/2020, 9/16 lymph nodes positive, lymphovascular invasion, 1 satellite nodule, negative margins, MSS, no loss of mismatch repair protein expression -History of large polyp in the left side of the colon-referred to [facility] in 05/2018 for procedure canceled secondary to COVID-19 pandemic.  Procedure was not rescheduled. -CT chest/abdomen/pelvis with contrast 07/27/2020-3 small pulmonary nodules less than 5 mm favored to be benign, circumferential luminal narrowing of the distal transverse colon concerning for malignancy, no metastatic adenopathy in the mesentery porta hepatis, no for metastasis. -CEA on 07/27/2020 was 17.3; 37 on 08/30/2020; 33 on 09/13/2020 -Colonoscopy performed 07/27/2020 showed a fungating, infiltrative and ulcerated nonobstructing large mass in the proximal descending colon.  Biopsy-adenocarcinoma -Cycle 1 FOLFOX 08/30/2020 -Cycle 2 FOLFOX 09/13/2020, Emend added for delayed nausea -Cycle 3 FOLFOX 09/27/2020 -Cycle 4 FOLFOX 10/11/2020 -Cycle 5 FOLFOX 11/08/2020 -Cycle 6 FOLFOX 11/23/2020 -CT 12/03/2020-prior 3 mm left apical nodule no longer seen, no new/suspicious pulmonary nodules, no evidence of metastatic disease -Cycle 7 FOLFOX 12/06/2020 2.  Anemia due to GI bleeding, iron deficiency?,  Renal insufficiency? 3.  New onset acute diastolic CHF March 7026 4.  Diabetes mellitus 5.  Renal insufficiency 6.  Hypertension 7.  History of left transmetatarsal amputation 8.  History of colon polyps 9.  Neuropathy 10.  Delayed nausea secondary to chemotherapy-Decadron prophylaxis added following cycle 7 FOLFOX    Disposition: Mr. Carmickle  appears stable.  He has completed 6 cycles of FOLFOX.  The restaging CTs revealed no evidence of metastatic disease.  The CEA remains mildly elevated, but has improved.  He understands he may have metastatic disease that is not seen on the CTs.  The plan is to continue FOLFOX.  He will complete another cycle today.  Oxaliplatin will be discontinued if he develops progressive neuropathy symptoms.  Mr. Labrie has delayed nausea following chemotherapy.  He will begin Decadron prophylaxis with this cycle.  He will call for a blood sugar over 300.  His blood pressure is higher today.  He will resume carvedilol.  Mr. Wiswell return for an office visit and chemotherapy in 2 weeks.  Betsy Coder, MD  12/06/2020  9:21 AM

## 2020-12-06 NOTE — Progress Notes (Signed)
Spoke with patient about Dr Benay Spice wanting him to restart Coreg.   Pt states he has this medication at home and does not need a refill at this time.

## 2020-12-06 NOTE — Patient Instructions (Signed)
[facility]  Discharge Instructions: Thank you for choosing [facility] to provide your oncology and hematology care.   If you have a lab appointment with the [facility], please go directly to the [facility] and check in at the registration area.   Wear comfortable clothing and clothing appropriate for easy access to any Portacath or PICC line.   We strive to give you quality time with your provider. You may need to reschedule your appointment if you arrive late (15 or more minutes).  Arriving late affects you and other patients whose appointments are after yours.  Also, if you miss three or more appointments without notifying the office, you may be dismissed from the clinic at the provider's discretion.      For prescription refill requests, have your pharmacy contact our office and allow 72 hours for refills to be completed.    Today you received the following chemotherapy and/or immunotherapy agents Oxaliplatin, Leucovorin, 5FU      To help prevent nausea and vomiting after your treatment, we encourage you to take your nausea medication as directed.  BELOW ARE SYMPTOMS THAT SHOULD BE REPORTED IMMEDIATELY: *FEVER GREATER THAN 100.4 F (38 C) OR HIGHER *CHILLS OR SWEATING *NAUSEA AND VOMITING THAT IS NOT CONTROLLED WITH YOUR NAUSEA MEDICATION *UNUSUAL SHORTNESS OF BREATH *UNUSUAL BRUISING OR BLEEDING *URINARY PROBLEMS (pain or burning when urinating, or frequent urination) *BOWEL PROBLEMS (unusual diarrhea, constipation, pain near the anus) TENDERNESS IN MOUTH AND THROAT WITH OR WITHOUT PRESENCE OF ULCERS (sore throat, sores in mouth, or a toothache) UNUSUAL RASH, SWELLING OR PAIN  UNUSUAL VAGINAL DISCHARGE OR ITCHING   Items with * indicate a potential emergency and should be followed up as soon as possible or go to the Emergency Department if any problems should occur.  Please show the CHEMOTHERAPY ALERT CARD or IMMUNOTHERAPY ALERT CARD  at check-in to the Emergency Department and triage nurse.  Should you have questions after your visit or need to cancel or reschedule your appointment, please contact [facility]  Dept: [phone]  and follow the prompts.  Office hours are 8:00 a.m. to 4:30 p.m. Monday - Friday. Please note that voicemails left after 4:00 p.m. may not be returned until the following business day.  We are closed weekends and major holidays. You have access to a nurse at all times for urgent questions. Please call the main number to the clinic Dept: [phone] and follow the prompts.   For any non-urgent questions, you may also contact your provider using MyChart. We now offer e-Visits for anyone 70 and older to request care online for non-urgent symptoms. For details visit [email or web address].   Also download the MyChart app! Go to the app store, search "MyChart", open the app, select [facility], and log in with your MyChart username and password.  Due to Covid, a mask is required upon entering the hospital/clinic. If you do not have a mask, one will be given to you upon arrival. For doctor visits, patients may have 1 support person aged 30 or older with them. For treatment visits, patients cannot have anyone with them due to current Covid guidelines and our immunocompromised population.

## 2020-12-06 NOTE — Progress Notes (Signed)
Script for dexamethasone sent to pharmacy per MD.

## 2020-12-07 ENCOUNTER — Encounter: Payer: Self-pay | Admitting: *Deleted

## 2020-12-07 NOTE — Progress Notes (Signed)
Faxed 12/06/20 office note/labs to [facility] [phone].

## 2020-12-08 ENCOUNTER — Inpatient Hospital Stay: Payer: 59

## 2020-12-08 ENCOUNTER — Other Ambulatory Visit: Payer: Self-pay

## 2020-12-08 VITALS — BP 143/79 | HR 89 | Temp 98.4°F | Resp 18

## 2020-12-08 DIAGNOSIS — Z5111 Encounter for antineoplastic chemotherapy: Secondary | ICD-10-CM | POA: Diagnosis not present

## 2020-12-08 DIAGNOSIS — C185 Malignant neoplasm of splenic flexure: Secondary | ICD-10-CM

## 2020-12-08 MED ORDER — HEPARIN SOD (PORK) LOCK FLUSH 100 UNIT/ML IV SOLN
500.0000 [IU] | Freq: Once | INTRAVENOUS | Status: AC | PRN
  Administered 2020-12-08: 500 [IU]
  Filled 2020-12-08: qty 5

## 2020-12-08 MED ORDER — PEGFILGRASTIM-BMEZ 6 MG/0.6ML ~~LOC~~ SOSY
6.0000 mg | PREFILLED_SYRINGE | Freq: Once | SUBCUTANEOUS | Status: AC
  Administered 2020-12-08: 6 mg via SUBCUTANEOUS

## 2020-12-08 MED ORDER — SODIUM CHLORIDE 0.9% FLUSH
10.0000 mL | INTRAVENOUS | Status: DC | PRN
  Administered 2020-12-08: 10 mL
  Filled 2020-12-08: qty 10

## 2020-12-08 NOTE — Patient Instructions (Signed)

## 2020-12-17 ENCOUNTER — Other Ambulatory Visit: Payer: Self-pay | Admitting: Oncology

## 2020-12-20 ENCOUNTER — Inpatient Hospital Stay: Payer: 59 | Admitting: Nurse Practitioner

## 2020-12-20 ENCOUNTER — Inpatient Hospital Stay: Payer: 59

## 2020-12-20 ENCOUNTER — Other Ambulatory Visit: Payer: Self-pay

## 2020-12-20 ENCOUNTER — Inpatient Hospital Stay: Payer: 59 | Attending: Oncology

## 2020-12-20 DIAGNOSIS — C185 Malignant neoplasm of splenic flexure: Secondary | ICD-10-CM

## 2020-12-20 DIAGNOSIS — C186 Malignant neoplasm of descending colon: Secondary | ICD-10-CM | POA: Insufficient documentation

## 2020-12-20 DIAGNOSIS — E1140 Type 2 diabetes mellitus with diabetic neuropathy, unspecified: Secondary | ICD-10-CM | POA: Insufficient documentation

## 2020-12-20 DIAGNOSIS — Z5189 Encounter for other specified aftercare: Secondary | ICD-10-CM | POA: Diagnosis not present

## 2020-12-20 DIAGNOSIS — Z794 Long term (current) use of insulin: Secondary | ICD-10-CM | POA: Insufficient documentation

## 2020-12-20 DIAGNOSIS — N289 Disorder of kidney and ureter, unspecified: Secondary | ICD-10-CM | POA: Insufficient documentation

## 2020-12-20 DIAGNOSIS — Z9221 Personal history of antineoplastic chemotherapy: Secondary | ICD-10-CM | POA: Diagnosis not present

## 2020-12-20 DIAGNOSIS — Z79899 Other long term (current) drug therapy: Secondary | ICD-10-CM | POA: Diagnosis not present

## 2020-12-20 DIAGNOSIS — K922 Gastrointestinal hemorrhage, unspecified: Secondary | ICD-10-CM | POA: Insufficient documentation

## 2020-12-20 DIAGNOSIS — I110 Hypertensive heart disease with heart failure: Secondary | ICD-10-CM | POA: Insufficient documentation

## 2020-12-20 DIAGNOSIS — D500 Iron deficiency anemia secondary to blood loss (chronic): Secondary | ICD-10-CM | POA: Diagnosis not present

## 2020-12-20 DIAGNOSIS — Z5111 Encounter for antineoplastic chemotherapy: Secondary | ICD-10-CM | POA: Insufficient documentation

## 2020-12-20 DIAGNOSIS — Z8719 Personal history of other diseases of the digestive system: Secondary | ICD-10-CM | POA: Diagnosis not present

## 2020-12-20 LAB — CBC WITH DIFFERENTIAL (CANCER CENTER ONLY)
Abs Immature Granulocytes: 0.99 10*3/uL — ABNORMAL HIGH (ref 0.00–0.07)
Basophils Absolute: 0.1 10*3/uL (ref 0.0–0.1)
Basophils Relative: 1 %
Eosinophils Absolute: 0.1 10*3/uL (ref 0.0–0.5)
Eosinophils Relative: 0 %
HCT: 28.8 % — ABNORMAL LOW (ref 39.0–52.0)
Hemoglobin: 9.5 g/dL — ABNORMAL LOW (ref 13.0–17.0)
Immature Granulocytes: 4 %
Lymphocytes Relative: 8 %
Lymphs Abs: 2 10*3/uL (ref 0.7–4.0)
MCH: 33.1 pg (ref 26.0–34.0)
MCHC: 33 g/dL (ref 30.0–36.0)
MCV: 100.3 fL — ABNORMAL HIGH (ref 80.0–100.0)
Monocytes Absolute: 1.4 10*3/uL — ABNORMAL HIGH (ref 0.1–1.0)
Monocytes Relative: 6 %
Neutro Abs: 18.7 10*3/uL — ABNORMAL HIGH (ref 1.7–7.7)
Neutrophils Relative %: 81 %
Platelet Count: 170 10*3/uL (ref 150–400)
RBC: 2.87 MIL/uL — ABNORMAL LOW (ref 4.22–5.81)
RDW: 15 % (ref 11.5–15.5)
WBC Count: 23.3 10*3/uL — ABNORMAL HIGH (ref 4.0–10.5)
nRBC: 0.1 % (ref 0.0–0.2)

## 2020-12-20 LAB — CMP (CANCER CENTER ONLY)
ALT: 10 U/L (ref 0–44)
AST: 18 U/L (ref 15–41)
Albumin: 3.1 g/dL — ABNORMAL LOW (ref 3.5–5.0)
Alkaline Phosphatase: 177 U/L — ABNORMAL HIGH (ref 38–126)
Anion gap: 11 (ref 5–15)
BUN: 14 mg/dL (ref 6–20)
CO2: 24 mmol/L (ref 22–32)
Calcium: 8.3 mg/dL — ABNORMAL LOW (ref 8.9–10.3)
Chloride: 99 mmol/L (ref 98–111)
Creatinine: 1.75 mg/dL — ABNORMAL HIGH (ref 0.61–1.24)
GFR, Estimated: 45 mL/min — ABNORMAL LOW (ref >60)
Glucose, Bld: 438 mg/dL — ABNORMAL HIGH (ref 70–99)
Potassium: 4 mmol/L (ref 3.5–5.1)
Sodium: 134 mmol/L — ABNORMAL LOW (ref 135–145)
Total Bilirubin: 0.3 mg/dL (ref 0.3–1.2)
Total Protein: 6 g/dL — ABNORMAL LOW (ref 6.5–8.1)

## 2020-12-20 LAB — CEA (ACCESS): CEA (CHCC): 7.49 ng/mL — ABNORMAL HIGH (ref 0.00–5.00)

## 2020-12-21 ENCOUNTER — Inpatient Hospital Stay: Payer: 59

## 2020-12-21 ENCOUNTER — Inpatient Hospital Stay (HOSPITAL_BASED_OUTPATIENT_CLINIC_OR_DEPARTMENT_OTHER): Payer: 59 | Admitting: Nurse Practitioner

## 2020-12-21 ENCOUNTER — Encounter: Payer: Self-pay | Admitting: Nurse Practitioner

## 2020-12-21 VITALS — BP 125/87 | HR 79 | Temp 98.0°F | Resp 18 | Ht 73.0 in | Wt 190.0 lb

## 2020-12-21 DIAGNOSIS — C185 Malignant neoplasm of splenic flexure: Secondary | ICD-10-CM | POA: Diagnosis not present

## 2020-12-21 DIAGNOSIS — Z5111 Encounter for antineoplastic chemotherapy: Secondary | ICD-10-CM | POA: Diagnosis not present

## 2020-12-21 MED ORDER — SODIUM CHLORIDE 0.9 % IV SOLN
10.0000 mg | Freq: Once | INTRAVENOUS | Status: AC
  Administered 2020-12-21: 10 mg via INTRAVENOUS
  Filled 2020-12-21: qty 1

## 2020-12-21 MED ORDER — OXALIPLATIN CHEMO INJECTION 100 MG/20ML
65.0000 mg/m2 | Freq: Once | INTRAVENOUS | Status: AC
  Administered 2020-12-21: 135 mg via INTRAVENOUS
  Filled 2020-12-21: qty 10

## 2020-12-21 MED ORDER — LEUCOVORIN CALCIUM INJECTION 350 MG
400.0000 mg/m2 | Freq: Once | INTRAVENOUS | Status: AC
  Administered 2020-12-21: 832 mg via INTRAVENOUS
  Filled 2020-12-21: qty 41.6

## 2020-12-21 MED ORDER — SODIUM CHLORIDE 0.9 % IV SOLN
2400.0000 mg/m2 | INTRAVENOUS | Status: DC
  Administered 2020-12-21: 5000 mg via INTRAVENOUS
  Filled 2020-12-21: qty 100

## 2020-12-21 MED ORDER — PALONOSETRON HCL INJECTION 0.25 MG/5ML
0.2500 mg | Freq: Once | INTRAVENOUS | Status: AC
  Administered 2020-12-21: 0.25 mg via INTRAVENOUS
  Filled 2020-12-21: qty 5

## 2020-12-21 MED ORDER — SODIUM CHLORIDE 0.9 % IV SOLN
150.0000 mg | Freq: Once | INTRAVENOUS | Status: AC
  Administered 2020-12-21: 150 mg via INTRAVENOUS
  Filled 2020-12-21: qty 5

## 2020-12-21 MED ORDER — DEXTROSE 5 % IV SOLN
Freq: Once | INTRAVENOUS | Status: AC
  Filled 2020-12-21: qty 250

## 2020-12-21 NOTE — Progress Notes (Signed)
[facility] OFFICE PROGRESS NOTE   Diagnosis: Colon cancer  INTERVAL HISTORY:   Jeff[facility] Young returns as scheduled.  He completed cycle 7 FOLFOX 12/06/2020.  He reports tolerating chemotherapy well.  He had less nausea.  He did not take Decadron prophylaxis.  No mouth sores.  He had diarrhea for 1 day.  He has mild persistent cold sensitivity.  No numbness or tingling in the absence of cold exposure.  He reports blood sugar was in the 300 range this morning.  He took insulin as prescribed.  Objective:  Vital signs in last 24 hours:  Temperature 98, heart rate 79, respirations 18, blood pressure 125/87, weight 190 pounds    HEENT: White coating over tongue.  No buccal thrush.  No ulcers. Resp: Lungs clear bilaterally. Cardio: Regular rate and rhythm. GI: Abdomen soft and nontender.  No hepatomegaly. Vascular: No leg edema Neuro: Mild to moderate decrease in vibratory sense at the fingertips bilaterally. Skin: Palms without erythema. Port-A-Cath without erythema.   Lab Results:  Lab Results  Component Value Date   WBC 23.3 (H) 12/20/2020   HGB 9.5 (L) 12/20/2020   HCT 28.8 (L) 12/20/2020   MCV 100.3 (H) 12/20/2020   PLT 170 12/20/2020   NEUTROABS 18.7 (H) 12/20/2020    Imaging:  No results found.  Medications: I have reviewed the patient's current medications.  Assessment/Plan: Descending colon cancer, stage IIIc (T3N2b M0), status post a partial left colectomy 07/30/2020, 9/16 lymph nodes positive, lymphovascular invasion, 1 satellite nodule, negative margins, MSS, no loss of mismatch repair protein expression -History of large polyp in the left side of the colon-referred to [facility] in 05/2018 for procedure canceled secondary to COVID-19 pandemic.  Procedure was not rescheduled. -CT chest/abdomen/pelvis with contrast 07/27/2020-3 small pulmonary nodules less than 5 mm favored to be benign, circumferential luminal narrowing of the distal transverse colon concerning  for malignancy, no metastatic adenopathy in the mesentery porta hepatis, no for metastasis. -CEA on 07/27/2020 was 17.3; 37 on 08/30/2020; 33 on 09/13/2020 -Colonoscopy performed 07/27/2020 showed a fungating, infiltrative and ulcerated nonobstructing large mass in the proximal descending colon.  Biopsy-adenocarcinoma -Cycle 1 FOLFOX 08/30/2020 -Cycle 2 FOLFOX 09/13/2020, Emend added for delayed nausea -Cycle 3 FOLFOX 09/27/2020 -Cycle 4 FOLFOX 10/11/2020 -Cycle 5 FOLFOX 11/08/2020 -Cycle 6 FOLFOX 11/23/2020 -CT 12/03/2020-prior 3 mm left apical nodule no longer seen, no new/suspicious pulmonary nodules, no evidence of metastatic disease -Cycle 7 FOLFOX 12/06/2020 -Cycle 8 FOLFOX 12/21/2020 2.  Anemia due to GI bleeding, iron deficiency?,  Renal insufficiency? 3.  New onset acute diastolic CHF March 9030 4.  Diabetes mellitus 5.  Renal insufficiency 6.  Hypertension 7.  History of left transmetatarsal amputation 8.  History of colon polyps 9.  Neuropathy 10.  Delayed nausea secondary to chemotherapy-Decadron prophylaxis added following cycle 7 FOLFOX (he did not take)    Disposition: Jeffrey Young appears stable.  He has completed 7 cycles of FOLFOX.  Plan to proceed with cycle 8 today as scheduled.  Labs from 12/20/2020 reviewed.  Adequate to proceed with treatment.  The [place] is elevated.  We will hold white cell growth factor support with this cycle.  Blood sugar elevated at 438 yesterday, 300s today.  He is taking insulin as prescribed.  We will check a fingerstick glucose while he is here today.  He will monitor blood sugars closely at home.  He will return for lab, follow-up, cycle 9 FOLFOX in 2 weeks.  We are available to see him sooner if  needed.    Ned Card ANP/GNP-BC   12/21/2020  9:08 AM

## 2020-12-21 NOTE — Patient Instructions (Signed)
Jeffrey Young   Discharge Instructions: Thank you for choosing [facility] to provide your oncology and hematology care.   If you have a lab appointment with the [facility], please go directly to the [facility] and check in at the registration area.   Wear comfortable clothing and clothing appropriate for easy access to any Portacath or PICC line.   We strive to give you quality time with your provider. You may need to reschedule your appointment if you arrive late (15 or more minutes).  Arriving late affects you and other patients whose appointments are after yours.  Also, if you miss three or more appointments without notifying the office, you may be dismissed from the clinic at the provider's discretion.      For prescription refill requests, have your pharmacy contact our office and allow 72 hours for refills to be completed.    Today you received the following chemotherapy and/or immunotherapy agents Oxaliplatin (ELOXATIN), Leucovorin & Flourouracil (ADRUCIL).      To help prevent nausea and vomiting after your treatment, we encourage you to take your nausea medication as directed.  BELOW ARE SYMPTOMS THAT SHOULD BE REPORTED IMMEDIATELY: *FEVER GREATER THAN 100.4 F (38 C) OR HIGHER *CHILLS OR SWEATING *NAUSEA AND VOMITING THAT IS NOT CONTROLLED WITH YOUR NAUSEA MEDICATION *UNUSUAL SHORTNESS OF BREATH *UNUSUAL BRUISING OR BLEEDING *URINARY PROBLEMS (pain or burning when urinating, or frequent urination) *BOWEL PROBLEMS (unusual diarrhea, constipation, pain near the anus) TENDERNESS IN MOUTH AND THROAT WITH OR WITHOUT PRESENCE OF ULCERS (sore throat, sores in mouth, or a toothache) UNUSUAL RASH, SWELLING OR PAIN  UNUSUAL VAGINAL DISCHARGE OR ITCHING   Items with * indicate a potential emergency and should be followed up as soon as possible or go to the Emergency Department if any problems should occur.  Please show the CHEMOTHERAPY ALERT  CARD or IMMUNOTHERAPY ALERT CARD at check-in to the Emergency Department and triage nurse.  Should you have questions after your visit or need to cancel or reschedule your appointment, please contact [facility]  Dept: [phone]  and follow the prompts.  Office hours are 8:00 a.m. to 4:30 p.m. Monday - Friday. Please note that voicemails left after 4:00 p.m. may not be returned until the following business day.  We are closed weekends and major holidays. You have access to a nurse at all times for urgent questions. Please call the main number to the clinic Dept: [phone] and follow the prompts.   For any non-urgent questions, you may also contact your provider using MyChart. We now offer e-Visits for anyone 50 and older to request care online for non-urgent symptoms. For details visit [email or web address].   Also download the MyChart app! Go to the app store, search "MyChart", open the app, select [facility], and log in with your MyChart username and password.  Due to Covid, a mask is required upon entering the hospital/clinic. If you do not have a mask, one will be given to you upon arrival. For doctor visits, patients may have 1 support person aged 68 or older with them. For treatment visits, patients cannot have anyone with them due to current Covid guidelines and our immunocompromised population.   Oxaliplatin Injection What is this medication? OXALIPLATIN (ox AL i PLA tin) is a chemotherapy drug. It targets fast dividing cells, like cancer cells, and causes these cells to die. This medicine is usedto treat cancers of the colon and rectum, and many  other cancers. This medicine may be used for other purposes; ask your health care provider orpharmacist if you have questions. COMMON BRAND NAME(S): Eloxatin What should I tell my care team before I take this medication? They need to know if you have any of these conditions: heart disease history of irregular  heartbeat liver disease low blood counts, like white cells, platelets, or red blood cells lung or breathing disease, like asthma take medicines that treat or prevent blood clots tingling of the fingers or toes, or other nerve disorder an unusual or allergic reaction to oxaliplatin, other chemotherapy, other medicines, foods, dyes, or preservatives pregnant or trying to get pregnant breast-feeding How should I use this medication? This drug is given as an infusion into a vein. It is administered in a hospitalor clinic by a specially trained health care professional. Talk to your pediatrician regarding the use of this medicine in children.Special care may be needed. Overdosage: If you think you have taken too much of this medicine contact apoison control center or emergency room at once. NOTE: This medicine is only for you. Do not share this medicine with others. What if I miss a dose? It is important not to miss a dose. Call your doctor or health careprofessional if you are unable to keep an appointment. What may interact with this medication? Do not take this medicine with any of the following medications: cisapride dronedarone pimozide thioridazine This medicine may also interact with the following medications: aspirin and aspirin-like medicines certain medicines that treat or prevent blood clots like warfarin, apixaban, dabigatran, and rivaroxaban cisplatin cyclosporine diuretics medicines for infection like acyclovir, adefovir, amphotericin B, bacitracin, cidofovir, foscarnet, ganciclovir, gentamicin, pentamidine, vancomycin NSAIDs, medicines for pain and inflammation, like ibuprofen or naproxen other medicines that prolong the QT interval (an abnormal heart rhythm) pamidronate zoledronic acid This list may not describe all possible interactions. Give your health care provider a list of all the medicines, herbs, non-prescription drugs, or dietary supplements you use. Also tell  them if you smoke, drink alcohol, or use illegaldrugs. Some items may interact with your medicine. What should I watch for while using this medication? Your condition will be monitored carefully while you are receiving thismedicine. You may need blood work done while you are taking this medicine. This medicine may make you feel generally unwell. This is not uncommon as chemotherapy can affect healthy cells as well as cancer cells. Report any side effects. Continue your course of treatment even though you feel ill unless yourhealthcare professional tells you to stop. This medicine can make you more sensitive to cold. Do not drink cold drinks or use ice. Cover exposed skin before coming in contact with cold temperatures or cold objects. When out in cold weather wear warm clothing and cover your mouth and nose to warm the air that goes into your lungs. Tell your doctor if you getsensitive to the cold. Do not become pregnant while taking this medicine or for 9 months after stopping it. Women should inform their health care professional if they wish to become pregnant or think they might be pregnant. Men should not father a child while taking this medicine and for 6 months after stopping it. There is potential for serious side effects to an unborn child. Talk to your health careprofessional for more information. Do not breast-feed a child while taking this medicine or for 3 months afterstopping it. This medicine has caused ovarian failure in some women. This medicine may make it more difficult to get pregnant.  Talk to your health care professional if [place] concerned about your fertility. This medicine has caused decreased sperm counts in some men. This may make it more difficult to father a child. Talk to your health care professional if [place] concerned about your fertility. This medicine may increase your risk of getting an infection. Call your health care professional for advice if you get a fever, chills,  or sore throat, or other symptoms of a cold or flu. Do not treat yourself. Try to avoid beingaround people who are sick. Avoid taking medicines that contain aspirin, acetaminophen, ibuprofen, naproxen, or ketoprofen unless instructed by your health care professional.These medicines may hide a fever. Be careful brushing or flossing your teeth or using a toothpick because you may get an infection or bleed more easily. If you have any dental work done, Primary school teacher you are receiving this medicine. What side effects may I notice from receiving this medication? Side effects that you should report to your doctor or health care professionalas soon as possible: allergic reactions like skin rash, itching or hives, swelling of the face, lips, or tongue breathing problems cough low blood counts - this medicine may decrease the number of white blood cells, red blood cells, and platelets. You may be at increased risk for infections and bleeding nausea, vomiting pain, redness, or irritation at site where injected pain, tingling, numbness in the hands or feet signs and symptoms of bleeding such as bloody or black, tarry stools; red or dark brown urine; spitting up blood or brown material that looks like coffee grounds; red spots on the skin; unusual bruising or bleeding from the eyes, gums, or nose signs and symptoms of a dangerous change in heartbeat or heart rhythm like chest pain; dizziness; fast, irregular heartbeat; palpitations; feeling faint or lightheaded; falls signs and symptoms of infection like fever; chills; cough; sore throat; pain or trouble passing urine signs and symptoms of liver injury like dark yellow or brown urine; general ill feeling or flu-like symptoms; light-colored stools; loss of appetite; nausea; right upper belly pain; unusually weak or tired; yellowing of the eyes or skin signs and symptoms of low red blood cells or anemia such as unusually weak or tired; feeling faint or  lightheaded; falls signs and symptoms of muscle injury like dark urine; trouble passing urine or change in the amount of urine; unusually weak or tired; muscle pain; back pain Side effects that usually do not require medical attention (report to yourdoctor or health care professional if they continue or are bothersome): changes in taste diarrhea gas hair loss loss of appetite mouth sores This list may not describe all possible side effects. Call your doctor for medical advice about side effects. You may report side effects to FDA at1-800-FDA-1088. Where should I keep my medication? This drug is given in a hospital or clinic and will not be stored at home. NOTE: This sheet is a summary. It may not cover all possible information. If you have questions about this medicine, talk to your doctor, pharmacist, orhealth care provider.  2022 Elsevier/Gold Standard (2018-09-25 12:20:35)  Leucovorin injection What is this medication? LEUCOVORIN (loo koe VOR in) is used to prevent or treat the harmful effects of some medicines. This medicine is used to treat anemia caused by a low amount of folic acid in the body. It is also used with 5-fluorouracil (5-FU) to treatcolon cancer. This medicine may be used for other purposes; ask your health care provider orpharmacist if you have questions. What should I  tell my care team before I take this medication? They need to know if you have any of these conditions: anemia from low levels of vitamin B-12 in the blood an unusual or allergic reaction to leucovorin, folic acid, other medicines, foods, dyes, or preservatives pregnant or trying to get pregnant breast-feeding How should I use this medication? This medicine is for injection into a muscle or into a vein. It is given by ahealth care professional in a hospital or clinic setting. Talk to your pediatrician regarding the use of this medicine in children.Special care may be needed. Overdosage: If you think you  have taken too much of this medicine contact apoison control center or emergency room at once. NOTE: This medicine is only for you. Do not share this medicine with others. What if I miss a dose? This does not apply. What may interact with this medication? capecitabine fluorouracil phenobarbital phenytoin primidone trimethoprim-sulfamethoxazole This list may not describe all possible interactions. Give your health care provider a list of all the medicines, herbs, non-prescription drugs, or dietary supplements you use. Also tell them if you smoke, drink alcohol, or use illegaldrugs. Some items may interact with your medicine. What should I watch for while using this medication? Your condition will be monitored carefully while you are receiving thismedicine. This medicine may increase the side effects of 5-fluorouracil, 5-FU. Tell your doctor or health care professional if you have diarrhea or mouth sores that donot get better or that get worse. What side effects may I notice from receiving this medication? Side effects that you should report to your doctor or health care professionalas soon as possible: allergic reactions like skin rash, itching or hives, swelling of the face, lips, or tongue breathing problems fever, infection mouth sores unusual bleeding or bruising unusually weak or tired Side effects that usually do not require medical attention (report to yourdoctor or health care professional if they continue or are bothersome): constipation or diarrhea loss of appetite nausea, vomiting This list may not describe all possible side effects. Call your doctor for medical advice about side effects. You may report side effects to FDA at1-800-FDA-1088. Where should I keep my medication? This drug is given in a hospital or clinic and will not be stored at home. NOTE: This sheet is a summary. It may not cover all possible information. If you have questions about this medicine, talk to your  doctor, pharmacist, orhealth care provider.  2022 Elsevier/Gold Standard (2007-11-12 16:50:29)  Fluorouracil, 5-FU injection What is this medication? FLUOROURACIL, 5-FU (flure oh YOOR a sil) is a chemotherapy drug. It slows the growth of cancer cells. This medicine is used to treat many types of cancer like breast cancer, colon or rectal cancer, pancreatic cancer, and stomachcancer. This medicine may be used for other purposes; ask your health care provider orpharmacist if you have questions. COMMON BRAND NAME(S): Adrucil What should I tell my care team before I take this medication? They need to know if you have any of these conditions: blood disorders dihydropyrimidine dehydrogenase (DPD) deficiency infection (especially a virus infection such as chickenpox, cold sores, or herpes) kidney disease liver disease malnourished, poor nutrition recent or ongoing radiation therapy an unusual or allergic reaction to fluorouracil, other chemotherapy, other medicines, foods, dyes, or preservatives pregnant or trying to get pregnant breast-feeding How should I use this medication? This drug is given as an infusion or injection into a vein. It is administeredin a hospital or clinic by a specially trained health care professional. Talk to  your pediatrician regarding the use of this medicine in children.Special care may be needed. Overdosage: If you think you have taken too much of this medicine contact apoison control center or emergency room at once. NOTE: This medicine is only for you. Do not share this medicine with others. What if I miss a dose? It is important not to miss your dose. Call your doctor or health careprofessional if you are unable to keep an appointment. What may interact with this medication? Do not take this medicine with any of the following medications: live virus vaccines This medicine may also interact with the following medications: medicines that treat or prevent blood  clots like warfarin, enoxaparin, and dalteparin This list may not describe all possible interactions. Give your health care provider a list of all the medicines, herbs, non-prescription drugs, or dietary supplements you use. Also tell them if you smoke, drink alcohol, or use illegaldrugs. Some items may interact with your medicine. What should I watch for while using this medication? Visit your doctor for checks on your progress. This drug may make you feel generally unwell. This is not uncommon, as chemotherapy can affect healthy cells as well as cancer cells. Report any side effects. Continue your course oftreatment even though you feel ill unless your doctor tells you to stop. In some cases, you may be given additional medicines to help with side effects.Follow all directions for their use. Call your doctor or health care professional for advice if you get a fever, chills or sore throat, or other symptoms of a cold or flu. Do not treat yourself. This drug decreases your body's ability to fight infections. Try toavoid being around people who are sick. This medicine may increase your risk to bruise or bleed. Call your doctor orhealth care professional if you notice any unusual bleeding. Be careful brushing and flossing your teeth or using a toothpick because you may get an infection or bleed more easily. If you have any dental work done,tell your dentist you are receiving this medicine. Avoid taking products that contain aspirin, acetaminophen, ibuprofen, naproxen, or ketoprofen unless instructed by your doctor. These medicines may hide afever. Do not become pregnant while taking this medicine. Women should inform their doctor if they wish to become pregnant or think they might be pregnant. There is a potential for serious side effects to an unborn child. Talk to your health care professional or pharmacist for more information. Do not breast-feed aninfant while taking this medicine. Men should inform  their doctor if they wish to father a child. This medicinemay lower sperm counts. Do not treat diarrhea with over the counter products. Contact your doctor ifyou have diarrhea that lasts more than 2 days or if it is severe and watery. This medicine can make you more sensitive to the sun. Keep out of the sun. If you cannot avoid being in the sun, wear protective clothing and use sunscreen.Do not use sun lamps or tanning beds/booths. What side effects may I notice from receiving this medication? Side effects that you should report to your doctor or health care professionalas soon as possible: allergic reactions like skin rash, itching or hives, swelling of the face, lips, or tongue low blood counts - this medicine may decrease the number of white blood cells, red blood cells and platelets. You may be at increased risk for infections and bleeding. signs of infection - fever or chills, cough, sore throat, pain or difficulty passing urine signs of decreased platelets or bleeding - bruising, pinpoint  red spots on the skin, black, tarry stools, blood in the urine signs of decreased red blood cells - unusually weak or tired, fainting spells, lightheadedness breathing problems changes in vision chest pain mouth sores nausea and vomiting pain, swelling, redness at site where injected pain, tingling, numbness in the hands or feet redness, swelling, or sores on hands or feet stomach pain unusual bleeding Side effects that usually do not require medical attention (report to yourdoctor or health care professional if they continue or are bothersome): changes in finger or toe nails diarrhea dry or itchy skin hair loss headache loss of appetite sensitivity of eyes to the light stomach upset unusually teary eyes This list may not describe all possible side effects. Call your doctor for medical advice about side effects. You may report side effects to FDA at1-800-FDA-1088. Where should I keep my  medication? This drug is given in a hospital or clinic and will not be stored at home. NOTE: This sheet is a summary. It may not cover all possible information. If you have questions about this medicine, talk to your doctor, pharmacist, orhealth care provider.  2022 Elsevier/Gold Standard (2019-04-08 15:00:03)

## 2020-12-22 ENCOUNTER — Inpatient Hospital Stay: Payer: 59

## 2020-12-22 ENCOUNTER — Encounter: Payer: Self-pay | Admitting: *Deleted

## 2020-12-22 NOTE — Progress Notes (Signed)
Faxed 12/21/20 office note/lab to [facility] [phone]

## 2020-12-23 ENCOUNTER — Inpatient Hospital Stay: Payer: 59

## 2020-12-23 ENCOUNTER — Other Ambulatory Visit: Payer: Self-pay

## 2020-12-23 VITALS — BP 142/89 | HR 83 | Temp 98.5°F | Resp 18

## 2020-12-23 DIAGNOSIS — C185 Malignant neoplasm of splenic flexure: Secondary | ICD-10-CM

## 2020-12-23 DIAGNOSIS — Z5111 Encounter for antineoplastic chemotherapy: Secondary | ICD-10-CM | POA: Diagnosis not present

## 2020-12-23 MED ORDER — HEPARIN SOD (PORK) LOCK FLUSH 100 UNIT/ML IV SOLN
500.0000 [IU] | Freq: Once | INTRAVENOUS | Status: AC | PRN
  Administered 2020-12-23: 500 [IU]
  Filled 2020-12-23: qty 5

## 2020-12-23 MED ORDER — SODIUM CHLORIDE 0.9% FLUSH
10.0000 mL | INTRAVENOUS | Status: DC | PRN
  Administered 2020-12-23: 10 mL
  Filled 2020-12-23: qty 10

## 2021-01-02 ENCOUNTER — Other Ambulatory Visit: Payer: Self-pay | Admitting: Oncology

## 2021-01-03 ENCOUNTER — Inpatient Hospital Stay (HOSPITAL_BASED_OUTPATIENT_CLINIC_OR_DEPARTMENT_OTHER): Payer: 59 | Admitting: Nurse Practitioner

## 2021-01-03 ENCOUNTER — Other Ambulatory Visit: Payer: Self-pay | Admitting: Nurse Practitioner

## 2021-01-03 ENCOUNTER — Inpatient Hospital Stay: Payer: 59

## 2021-01-03 ENCOUNTER — Other Ambulatory Visit: Payer: Self-pay

## 2021-01-03 ENCOUNTER — Encounter: Payer: Self-pay | Admitting: Nurse Practitioner

## 2021-01-03 VITALS — BP 154/98 | HR 80

## 2021-01-03 VITALS — BP 163/97 | HR 87 | Temp 97.8°F | Resp 18 | Ht 73.0 in | Wt 188.6 lb

## 2021-01-03 DIAGNOSIS — Z5111 Encounter for antineoplastic chemotherapy: Secondary | ICD-10-CM | POA: Diagnosis not present

## 2021-01-03 DIAGNOSIS — C185 Malignant neoplasm of splenic flexure: Secondary | ICD-10-CM

## 2021-01-03 LAB — CBC WITH DIFFERENTIAL (CANCER CENTER ONLY)
Abs Immature Granulocytes: 0.02 10*3/uL (ref 0.00–0.07)
Basophils Absolute: 0.1 10*3/uL (ref 0.0–0.1)
Basophils Relative: 1 %
Eosinophils Absolute: 0.1 10*3/uL (ref 0.0–0.5)
Eosinophils Relative: 2 %
HCT: 28.6 % — ABNORMAL LOW (ref 39.0–52.0)
Hemoglobin: 9.8 g/dL — ABNORMAL LOW (ref 13.0–17.0)
Immature Granulocytes: 0 %
Lymphocytes Relative: 25 %
Lymphs Abs: 1.5 10*3/uL (ref 0.7–4.0)
MCH: 33.1 pg (ref 26.0–34.0)
MCHC: 34.3 g/dL (ref 30.0–36.0)
MCV: 96.6 fL (ref 80.0–100.0)
Monocytes Absolute: 0.9 10*3/uL (ref 0.1–1.0)
Monocytes Relative: 14 %
Neutro Abs: 3.7 10*3/uL (ref 1.7–7.7)
Neutrophils Relative %: 58 %
Platelet Count: 188 10*3/uL (ref 150–400)
RBC: 2.96 MIL/uL — ABNORMAL LOW (ref 4.22–5.81)
RDW: 13.7 % (ref 11.5–15.5)
WBC Count: 6.3 10*3/uL (ref 4.0–10.5)
nRBC: 0 % (ref 0.0–0.2)

## 2021-01-03 LAB — CMP (CANCER CENTER ONLY)
ALT: 14 U/L (ref 0–44)
AST: 21 U/L (ref 15–41)
Albumin: 3.2 g/dL — ABNORMAL LOW (ref 3.5–5.0)
Alkaline Phosphatase: 141 U/L — ABNORMAL HIGH (ref 38–126)
Anion gap: 8 (ref 5–15)
BUN: 13 mg/dL (ref 6–20)
CO2: 28 mmol/L (ref 22–32)
Calcium: 8.9 mg/dL (ref 8.9–10.3)
Chloride: 102 mmol/L (ref 98–111)
Creatinine: 1.34 mg/dL — ABNORMAL HIGH (ref 0.61–1.24)
GFR, Estimated: >60 mL/min (ref >60)
Glucose, Bld: 171 mg/dL — ABNORMAL HIGH (ref 70–99)
Potassium: 3.3 mmol/L — ABNORMAL LOW (ref 3.5–5.1)
Sodium: 138 mmol/L (ref 135–145)
Total Bilirubin: 0.4 mg/dL (ref 0.3–1.2)
Total Protein: 6.4 g/dL — ABNORMAL LOW (ref 6.5–8.1)

## 2021-01-03 LAB — CEA (ACCESS): CEA (CHCC): 5.32 ng/mL — ABNORMAL HIGH (ref 0.00–5.00)

## 2021-01-03 MED ORDER — FLUOROURACIL CHEMO INJECTION 5 GM/100ML
2400.0000 mg/m2 | INTRAVENOUS | Status: DC
  Administered 2021-01-03: 5000 mg via INTRAVENOUS
  Filled 2021-01-03: qty 100

## 2021-01-03 MED ORDER — PALONOSETRON HCL INJECTION 0.25 MG/5ML
0.2500 mg | Freq: Once | INTRAVENOUS | Status: AC
  Administered 2021-01-03: 0.25 mg via INTRAVENOUS
  Filled 2021-01-03: qty 5

## 2021-01-03 MED ORDER — SODIUM CHLORIDE 0.9 % IV SOLN
10.0000 mg | Freq: Once | INTRAVENOUS | Status: AC
  Administered 2021-01-03: 10 mg via INTRAVENOUS
  Filled 2021-01-03: qty 1

## 2021-01-03 MED ORDER — DEXTROSE 5 % IV SOLN: Freq: Once | INTRAVENOUS | Status: AC

## 2021-01-03 MED ORDER — OXALIPLATIN CHEMO INJECTION 100 MG/20ML
65.0000 mg/m2 | Freq: Once | INTRAVENOUS | Status: AC
  Administered 2021-01-03: 135 mg via INTRAVENOUS
  Filled 2021-01-03: qty 20

## 2021-01-03 MED ORDER — SODIUM CHLORIDE 0.9 % IV SOLN
150.0000 mg | Freq: Once | INTRAVENOUS | Status: AC
  Administered 2021-01-03: 150 mg via INTRAVENOUS
  Filled 2021-01-03: qty 5

## 2021-01-03 MED ORDER — LEUCOVORIN CALCIUM INJECTION 350 MG
400.0000 mg/m2 | Freq: Once | INTRAVENOUS | Status: AC
  Administered 2021-01-03: 832 mg via INTRAVENOUS
  Filled 2021-01-03: qty 41.6

## 2021-01-03 MED ORDER — POTASSIUM CHLORIDE CRYS ER 20 MEQ PO TBCR: 20.0000 meq | EXTENDED_RELEASE_TABLET | Freq: Every day | ORAL | 1 refills | Status: DC

## 2021-01-03 NOTE — Progress Notes (Signed)
  [facility] OFFICE PROGRESS NOTE   Diagnosis: Colon cancer  INTERVAL HISTORY:   Jeffrey Young returns as scheduled.  He completed cycle 8 FOLFOX 12/21/2020.  He denies nausea/vomiting.  No mouth sores.  No diarrhea.  He has mild persistent cold sensitivity.  No numbness or tingling in the absence of cold exposure.  Objective:  Vital signs in last 24 hours:  Blood pressure (!) 163/97, pulse 87, temperature 97.8 F (36.6 C), temperature source Oral, resp. rate 18, height 6' 1" (1.854 m), weight 188 lb 9.6 oz (85.5 kg), SpO2 100 %.    HEENT: No thrush or ulcers. Resp: Lungs clear bilaterally. Cardio: Regular rate and rhythm. GI: Abdomen soft and nontender.  No hepatomegaly. Vascular: No leg edema. Neuro: Vibratory sense mildly decreased over the fingertips per tuning fork exam. Skin: Palms without erythema. Port-A-Cath without erythema.  Lab Results:  Lab Results  Component Value Date   WBC 6.3 01/03/2021   HGB 9.8 (L) 01/03/2021   HCT 28.6 (L) 01/03/2021   MCV 96.6 01/03/2021   PLT 188 01/03/2021   NEUTROABS 3.7 01/03/2021    Imaging:  No results found.  Medications: I have reviewed the patient's current medications.  Assessment/Plan: Descending colon cancer, stage IIIc (T3N2b M0), status post a partial left colectomy 07/30/2020, 9/16 lymph nodes positive, lymphovascular invasion, 1 satellite nodule, negative margins, MSS, no loss of mismatch repair protein expression -History of large polyp in the left side of the colon-referred to [facility] in 05/2018 for procedure canceled secondary to COVID-19 pandemic.  Procedure was not rescheduled. -CT chest/abdomen/pelvis with contrast 07/27/2020-3 small pulmonary nodules less than 5 mm favored to be benign, circumferential luminal narrowing of the distal transverse colon concerning for malignancy, no metastatic adenopathy in the mesentery porta hepatis, no for metastasis. -CEA on 07/27/2020 was 17.3; 37 on 08/30/2020; 33 on  09/13/2020 -Colonoscopy performed 07/27/2020 showed a fungating, infiltrative and ulcerated nonobstructing large mass in the proximal descending colon.  Biopsy-adenocarcinoma -Cycle 1 FOLFOX 08/30/2020 -Cycle 2 FOLFOX 09/13/2020, Emend added for delayed nausea -Cycle 3 FOLFOX 09/27/2020 -Cycle 4 FOLFOX 10/11/2020 -Cycle 5 FOLFOX 11/08/2020 -Cycle 6 FOLFOX 11/23/2020 -CT 12/03/2020-prior 3 mm left apical nodule no longer seen, no new/suspicious pulmonary nodules, no evidence of metastatic disease -Cycle 7 FOLFOX 12/06/2020 -Cycle 8 FOLFOX 12/21/2020 -Cycle 9 FOLFOX 01/03/2021 2.  Anemia due to GI bleeding, iron deficiency?,  Renal insufficiency? 3.  New onset acute diastolic CHF March 5784 4.  Diabetes mellitus 5.  Renal insufficiency 6.  Hypertension 7.  History of left transmetatarsal amputation 8.  History of colon polyps 9.  Neuropathy 10.  Delayed nausea secondary to chemotherapy-Decadron prophylaxis added following cycle 7 FOLFOX (he did not take)    Disposition: Jeffrey Young appears stable.  He has completed 8 cycles of FOLFOX.  Plan to proceed with cycle 9 today as scheduled.  We reviewed the CBC, chemistry panel and CEA from today.  Labs adequate to proceed with treatment.  CEA further improved.    Ned Card ANP/GNP-BC   01/03/2021  9:40 AM

## 2021-01-03 NOTE — Patient Instructions (Addendum)
[facility]  Discharge Instructions: Thank you for choosing [facility] to provide your oncology and hematology care.   If you have a lab appointment with the [facility], please go directly to the [facility] and check in at the registration area.   Wear comfortable clothing and clothing appropriate for easy access to any Portacath or PICC line.   We strive to give you quality time with your provider. You may need to reschedule your appointment if you arrive late (15 or more minutes).  Arriving late affects you and other patients whose appointments are after yours.  Also, if you miss three or more appointments without notifying the office, you may be dismissed from the clinic at the provider's discretion.      For prescription refill requests, have your pharmacy contact our office and allow 72 hours for refills to be completed.    Today you received the following chemotherapy and/or immunotherapy agents olaliplatin, leucovorin, fluorouracil   To help prevent nausea and vomiting after your treatment, we encourage you to take your nausea medication as directed.  BELOW ARE SYMPTOMS THAT SHOULD BE REPORTED IMMEDIATELY: *FEVER GREATER THAN 100.4 F (38 C) OR HIGHER *CHILLS OR SWEATING *NAUSEA AND VOMITING THAT IS NOT CONTROLLED WITH YOUR NAUSEA MEDICATION *UNUSUAL SHORTNESS OF BREATH *UNUSUAL BRUISING OR BLEEDING *URINARY PROBLEMS (pain or burning when urinating, or frequent urination) *BOWEL PROBLEMS (unusual diarrhea, constipation, pain near the anus) TENDERNESS IN MOUTH AND THROAT WITH OR WITHOUT PRESENCE OF ULCERS (sore throat, sores in mouth, or a toothache) UNUSUAL RASH, SWELLING OR PAIN  UNUSUAL VAGINAL DISCHARGE OR ITCHING   Items with * indicate a potential emergency and should be followed up as soon as possible or go to the Emergency Department if any problems should occur.  Please show the CHEMOTHERAPY ALERT CARD or IMMUNOTHERAPY ALERT  CARD at check-in to the Emergency Department and triage nurse.  Should you have questions after your visit or need to cancel or reschedule your appointment, please contact [facility]  Dept: [phone]  and follow the prompts.  Office hours are 8:00 a.m. to 4:30 p.m. Monday - Friday. Please note that voicemails left after 4:00 p.m. may not be returned until the following business day.  We are closed weekends and major holidays. You have access to a nurse at all times for urgent questions. Please call the main number to the clinic Dept: [phone] and follow the prompts.   For any non-urgent questions, you may also contact your provider using MyChart. We now offer e-Visits for anyone 14 and older to request care online for non-urgent symptoms. For details visit [email or web address].   Also download the MyChart app! Go to the app store, search "MyChart", open the app, select [facility], and log in with your MyChart username and password.  Due to Covid, a mask is required upon entering the hospital/clinic. If you do not have a mask, one will be given to you upon arrival. For doctor visits, patients may have 1 support person aged 1 or older with them. For treatment visits, patients cannot have anyone with them due to current Covid guidelines and our immunocompromised population.   Oxaliplatin Injection What is this medication? OXALIPLATIN (ox AL i PLA tin) is a chemotherapy drug. It targets fast dividing cells, like cancer cells, and causes these cells to die. This medicine is usedto treat cancers of the colon and rectum, and many other cancers. This medicine may be used  for other purposes; ask your health care provider orpharmacist if you have questions. COMMON BRAND NAME(S): Eloxatin What should I tell my care team before I take this medication? They need to know if you have any of these conditions: heart disease history of irregular heartbeat liver disease low  blood counts, like white cells, platelets, or red blood cells lung or breathing disease, like asthma take medicines that treat or prevent blood clots tingling of the fingers or toes, or other nerve disorder an unusual or allergic reaction to oxaliplatin, other chemotherapy, other medicines, foods, dyes, or preservatives pregnant or trying to get pregnant breast-feeding How should I use this medication? This drug is given as an infusion into a vein. It is administered in a hospitalor clinic by a specially trained health care professional. Talk to your pediatrician regarding the use of this medicine in children.Special care may be needed. Overdosage: If you think you have taken too much of this medicine contact apoison control center or emergency room at once. NOTE: This medicine is only for you. Do not share this medicine with others. What if I miss a dose? It is important not to miss a dose. Call your doctor or health careprofessional if you are unable to keep an appointment. What may interact with this medication? Do not take this medicine with any of the following medications: cisapride dronedarone pimozide thioridazine This medicine may also interact with the following medications: aspirin and aspirin-like medicines certain medicines that treat or prevent blood clots like warfarin, apixaban, dabigatran, and rivaroxaban cisplatin cyclosporine diuretics medicines for infection like acyclovir, adefovir, amphotericin B, bacitracin, cidofovir, foscarnet, ganciclovir, gentamicin, pentamidine, vancomycin NSAIDs, medicines for pain and inflammation, like ibuprofen or naproxen other medicines that prolong the QT interval (an abnormal heart rhythm) pamidronate zoledronic acid This list may not describe all possible interactions. Give your health care provider a list of all the medicines, herbs, non-prescription drugs, or dietary supplements you use. Also tell them if you smoke, drink alcohol,  or use illegaldrugs. Some items may interact with your medicine. What should I watch for while using this medication? Your condition will be monitored carefully while you are receiving thismedicine. You may need blood work done while you are taking this medicine. This medicine may make you feel generally unwell. This is not uncommon as chemotherapy can affect healthy cells as well as cancer cells. Report any side effects. Continue your course of treatment even though you feel ill unless yourhealthcare professional tells you to stop. This medicine can make you more sensitive to cold. Do not drink cold drinks or use ice. Cover exposed skin before coming in contact with cold temperatures or cold objects. When out in cold weather wear warm clothing and cover your mouth and nose to warm the air that goes into your lungs. Tell your doctor if you getsensitive to the cold. Do not become pregnant while taking this medicine or for 9 months after stopping it. Women should inform their health care professional if they wish to become pregnant or think they might be pregnant. Men should not father a child while taking this medicine and for 6 months after stopping it. There is potential for serious side effects to an unborn child. Talk to your health careprofessional for more information. Do not breast-feed a child while taking this medicine or for 3 months afterstopping it. This medicine has caused ovarian failure in some women. This medicine may make it more difficult to get pregnant. Talk to your health care professional if  youare concerned about your fertility. This medicine has caused decreased sperm counts in some men. This may make it more difficult to father a child. Talk to your health care professional if [place] concerned about your fertility. This medicine may increase your risk of getting an infection. Call your health care professional for advice if you get a fever, chills, or sore throat, or other symptoms  of a cold or flu. Do not treat yourself. Try to avoid beingaround people who are sick. Avoid taking medicines that contain aspirin, acetaminophen, ibuprofen, naproxen, or ketoprofen unless instructed by your health care professional.These medicines may hide a fever. Be careful brushing or flossing your teeth or using a toothpick because you may get an infection or bleed more easily. If you have any dental work done, Primary school teacher you are receiving this medicine. What side effects may I notice from receiving this medication? Side effects that you should report to your doctor or health care professionalas soon as possible: allergic reactions like skin rash, itching or hives, swelling of the face, lips, or tongue breathing problems cough low blood counts - this medicine may decrease the number of white blood cells, red blood cells, and platelets. You may be at increased risk for infections and bleeding nausea, vomiting pain, redness, or irritation at site where injected pain, tingling, numbness in the hands or feet signs and symptoms of bleeding such as bloody or black, tarry stools; red or dark brown urine; spitting up blood or brown material that looks like coffee grounds; red spots on the skin; unusual bruising or bleeding from the eyes, gums, or nose signs and symptoms of a dangerous change in heartbeat or heart rhythm like chest pain; dizziness; fast, irregular heartbeat; palpitations; feeling faint or lightheaded; falls signs and symptoms of infection like fever; chills; cough; sore throat; pain or trouble passing urine signs and symptoms of liver injury like dark yellow or brown urine; general ill feeling or flu-like symptoms; light-colored stools; loss of appetite; nausea; right upper belly pain; unusually weak or tired; yellowing of the eyes or skin signs and symptoms of low red blood cells or anemia such as unusually weak or tired; feeling faint or lightheaded; falls signs and symptoms of  muscle injury like dark urine; trouble passing urine or change in the amount of urine; unusually weak or tired; muscle pain; back pain Side effects that usually do not require medical attention (report to yourdoctor or health care professional if they continue or are bothersome): changes in taste diarrhea gas hair loss loss of appetite mouth sores This list may not describe all possible side effects. Call your doctor for medical advice about side effects. You may report side effects to FDA at1-800-FDA-1088. Where should I keep my medication? This drug is given in a hospital or clinic and will not be stored at home. NOTE: This sheet is a summary. It may not cover all possible information. If you have questions about this medicine, talk to your doctor, pharmacist, orhealth care provider.  2022 Elsevier/Gold Standard (2018-09-25 12:20:35)  Leucovorin injection What is this medication? LEUCOVORIN (loo koe VOR in) is used to prevent or treat the harmful effects of some medicines. This medicine is used to treat anemia caused by a low amount of folic acid in the body. It is also used with 5-fluorouracil (5-FU) to treatcolon cancer. This medicine may be used for other purposes; ask your health care provider orpharmacist if you have questions. What should I tell my care team before I take  this medication? They need to know if you have any of these conditions: anemia from low levels of vitamin B-12 in the blood an unusual or allergic reaction to leucovorin, folic acid, other medicines, foods, dyes, or preservatives pregnant or trying to get pregnant breast-feeding How should I use this medication? This medicine is for injection into a muscle or into a vein. It is given by ahealth care professional in a hospital or clinic setting. Talk to your pediatrician regarding the use of this medicine in children.Special care may be needed. Overdosage: If you think you have taken too much of this medicine  contact apoison control center or emergency room at once. NOTE: This medicine is only for you. Do not share this medicine with others. What if I miss a dose? This does not apply. What may interact with this medication? capecitabine fluorouracil phenobarbital phenytoin primidone trimethoprim-sulfamethoxazole This list may not describe all possible interactions. Give your health care provider a list of all the medicines, herbs, non-prescription drugs, or dietary supplements you use. Also tell them if you smoke, drink alcohol, or use illegaldrugs. Some items may interact with your medicine. What should I watch for while using this medication? Your condition will be monitored carefully while you are receiving thismedicine. This medicine may increase the side effects of 5-fluorouracil, 5-FU. Tell your doctor or health care professional if you have diarrhea or mouth sores that donot get better or that get worse. What side effects may I notice from receiving this medication? Side effects that you should report to your doctor or health care professionalas soon as possible: allergic reactions like skin rash, itching or hives, swelling of the face, lips, or tongue breathing problems fever, infection mouth sores unusual bleeding or bruising unusually weak or tired Side effects that usually do not require medical attention (report to yourdoctor or health care professional if they continue or are bothersome): constipation or diarrhea loss of appetite nausea, vomiting This list may not describe all possible side effects. Call your doctor for medical advice about side effects. You may report side effects to FDA at1-800-FDA-1088. Where should I keep my medication? This drug is given in a hospital or clinic and will not be stored at home. NOTE: This sheet is a summary. It may not cover all possible information. If you have questions about this medicine, talk to your doctor, pharmacist, orhealth care  provider.  2022 Elsevier/Gold Standard (2007-11-12 16:50:29)  Fluorouracil, 5-FU injection What is this medication? FLUOROURACIL, 5-FU (flure oh YOOR a sil) is a chemotherapy drug. It slows the growth of cancer cells. This medicine is used to treat many types of cancer like breast cancer, colon or rectal cancer, pancreatic cancer, and stomachcancer. This medicine may be used for other purposes; ask your health care provider orpharmacist if you have questions. COMMON BRAND NAME(S): Adrucil What should I tell my care team before I take this medication? They need to know if you have any of these conditions: blood disorders dihydropyrimidine dehydrogenase (DPD) deficiency infection (especially a virus infection such as chickenpox, cold sores, or herpes) kidney disease liver disease malnourished, poor nutrition recent or ongoing radiation therapy an unusual or allergic reaction to fluorouracil, other chemotherapy, other medicines, foods, dyes, or preservatives pregnant or trying to get pregnant breast-feeding How should I use this medication? This drug is given as an infusion or injection into a vein. It is administeredin a hospital or clinic by a specially trained health care professional. Talk to your pediatrician regarding the use of this  medicine in children.Special care may be needed. Overdosage: If you think you have taken too much of this medicine contact apoison control center or emergency room at once. NOTE: This medicine is only for you. Do not share this medicine with others. What if I miss a dose? It is important not to miss your dose. Call your doctor or health careprofessional if you are unable to keep an appointment. What may interact with this medication? Do not take this medicine with any of the following medications: live virus vaccines This medicine may also interact with the following medications: medicines that treat or prevent blood clots like warfarin, enoxaparin,  and dalteparin This list may not describe all possible interactions. Give your health care provider a list of all the medicines, herbs, non-prescription drugs, or dietary supplements you use. Also tell them if you smoke, drink alcohol, or use illegaldrugs. Some items may interact with your medicine. What should I watch for while using this medication? Visit your doctor for checks on your progress. This drug may make you feel generally unwell. This is not uncommon, as chemotherapy can affect healthy cells as well as cancer cells. Report any side effects. Continue your course oftreatment even though you feel ill unless your doctor tells you to stop. In some cases, you may be given additional medicines to help with side effects.Follow all directions for their use. Call your doctor or health care professional for advice if you get a fever, chills or sore throat, or other symptoms of a cold or flu. Do not treat yourself. This drug decreases your body's ability to fight infections. Try toavoid being around people who are sick. This medicine may increase your risk to bruise or bleed. Call your doctor orhealth care professional if you notice any unusual bleeding. Be careful brushing and flossing your teeth or using a toothpick because you may get an infection or bleed more easily. If you have any dental work done,tell your dentist you are receiving this medicine. Avoid taking products that contain aspirin, acetaminophen, ibuprofen, naproxen, or ketoprofen unless instructed by your doctor. These medicines may hide afever. Do not become pregnant while taking this medicine. Women should inform their doctor if they wish to become pregnant or think they might be pregnant. There is a potential for serious side effects to an unborn child. Talk to your health care professional or pharmacist for more information. Do not breast-feed aninfant while taking this medicine. Men should inform their doctor if they wish to father a  child. This medicinemay lower sperm counts. Do not treat diarrhea with over the counter products. Contact your doctor ifyou have diarrhea that lasts more than 2 days or if it is severe and watery. This medicine can make you more sensitive to the sun. Keep out of the sun. If you cannot avoid being in the sun, wear protective clothing and use sunscreen.Do not use sun lamps or tanning beds/booths. What side effects may I notice from receiving this medication? Side effects that you should report to your doctor or health care professionalas soon as possible: allergic reactions like skin rash, itching or hives, swelling of the face, lips, or tongue low blood counts - this medicine may decrease the number of white blood cells, red blood cells and platelets. You may be at increased risk for infections and bleeding. signs of infection - fever or chills, cough, sore throat, pain or difficulty passing urine signs of decreased platelets or bleeding - bruising, pinpoint red spots on the skin, black, tarry  stools, blood in the urine signs of decreased red blood cells - unusually weak or tired, fainting spells, lightheadedness breathing problems changes in vision chest pain mouth sores nausea and vomiting pain, swelling, redness at site where injected pain, tingling, numbness in the hands or feet redness, swelling, or sores on hands or feet stomach pain unusual bleeding Side effects that usually do not require medical attention (report to yourdoctor or health care professional if they continue or are bothersome): changes in finger or toe nails diarrhea dry or itchy skin hair loss headache loss of appetite sensitivity of eyes to the light stomach upset unusually teary eyes This list may not describe all possible side effects. Call your doctor for medical advice about side effects. You may report side effects to FDA at1-800-FDA-1088. Where should I keep my medication? This drug is given in a hospital  or clinic and will not be stored at home. NOTE: This sheet is a summary. It may not cover all possible information. If you have questions about this medicine, talk to your doctor, pharmacist, orhealth care provider.  2022 Elsevier/Gold Standard (2019-04-08 15:00:03)  The chemotherapy medication bag should finish at 46 hours, 96 hours, or 7 days. For example, if your pump is scheduled for 46 hours and it was put on at 4:00 p.m., it should finish at 2:00 p.m. the day it is scheduled to come off regardless of your appointment time.     Estimated time to finish at 1215.   If the display on your pump reads "Low Volume" and it is beeping, take the batteries out of the pump and come to the cancer center for it to be taken off.   If the pump alarms go off prior to the pump reading "Low Volume" then call [phone] and someone can assist you.  If the plunger comes out and the chemotherapy medication is leaking out, please use your home chemo spill kit to clean up the spill. Do NOT use paper towels or other household products.  If you have problems or questions regarding your pump, please call either [phone] (24 hours a day) or the cancer center Monday-Friday 8:00 a.m.- 4:30 p.m. at the clinic number and we will assist you. If you are unable to get assistance, then go to the nearest Emergency Department and ask the staff to contact the IV team for assistance.

## 2021-01-04 ENCOUNTER — Telehealth: Payer: Self-pay

## 2021-01-04 NOTE — Telephone Encounter (Signed)
-----   Message from Owens Shark, NP sent at 01/03/2021  3:37 PM EDT ----- Please let him know he has a mildly low potassium level on blood work today.  I sent a prescription to his pharmacy for K-Dur 20 mEq daily.

## 2021-01-04 NOTE — Telephone Encounter (Signed)
Message left for pt regarding most recent potassium results and new script awaiting pick up pt encouraged to return call for questions concerns or changes

## 2021-01-05 ENCOUNTER — Inpatient Hospital Stay: Payer: 59

## 2021-01-05 ENCOUNTER — Encounter: Payer: Self-pay | Admitting: *Deleted

## 2021-01-05 ENCOUNTER — Other Ambulatory Visit: Payer: Self-pay

## 2021-01-05 VITALS — BP 130/88 | HR 84 | Temp 98.3°F | Resp 18

## 2021-01-05 DIAGNOSIS — Z5111 Encounter for antineoplastic chemotherapy: Secondary | ICD-10-CM | POA: Diagnosis not present

## 2021-01-05 DIAGNOSIS — C185 Malignant neoplasm of splenic flexure: Secondary | ICD-10-CM

## 2021-01-05 MED ORDER — PEGFILGRASTIM-BMEZ 6 MG/0.6ML ~~LOC~~ SOSY
6.0000 mg | PREFILLED_SYRINGE | Freq: Once | SUBCUTANEOUS | Status: AC
  Administered 2021-01-05: 6 mg via SUBCUTANEOUS

## 2021-01-05 MED ORDER — HEPARIN SOD (PORK) LOCK FLUSH 100 UNIT/ML IV SOLN
500.0000 [IU] | Freq: Once | INTRAVENOUS | Status: AC | PRN
  Administered 2021-01-05: 500 [IU]

## 2021-01-05 MED ORDER — SODIUM CHLORIDE 0.9% FLUSH
10.0000 mL | INTRAVENOUS | Status: DC | PRN
  Administered 2021-01-05: 10 mL

## 2021-01-05 NOTE — Patient Instructions (Signed)

## 2021-01-05 NOTE — Progress Notes (Signed)
Faxed 01/03/21 office note,labs and treatment flowsheet to [facility] [phone]

## 2021-01-11 ENCOUNTER — Encounter: Payer: Self-pay | Admitting: *Deleted

## 2021-01-11 NOTE — Progress Notes (Signed)
Completed forms for [employer] completed and faxed to [phone] per wife's request. Att: Posey Rea. Copy to HIM to scan and copy will be provided to patient at next visit.

## 2021-01-15 ENCOUNTER — Other Ambulatory Visit: Payer: Self-pay | Admitting: Oncology

## 2021-01-17 ENCOUNTER — Inpatient Hospital Stay: Payer: 59

## 2021-01-17 ENCOUNTER — Other Ambulatory Visit: Payer: Self-pay

## 2021-01-17 ENCOUNTER — Other Ambulatory Visit: Payer: Self-pay | Admitting: Nurse Practitioner

## 2021-01-17 ENCOUNTER — Encounter: Payer: Self-pay | Admitting: Nurse Practitioner

## 2021-01-17 ENCOUNTER — Inpatient Hospital Stay (HOSPITAL_BASED_OUTPATIENT_CLINIC_OR_DEPARTMENT_OTHER): Payer: 59 | Admitting: Nurse Practitioner

## 2021-01-17 VITALS — BP 145/87 | HR 89 | Temp 97.7°F | Resp 18 | Ht 73.0 in | Wt 190.4 lb

## 2021-01-17 DIAGNOSIS — C185 Malignant neoplasm of splenic flexure: Secondary | ICD-10-CM

## 2021-01-17 DIAGNOSIS — Z5111 Encounter for antineoplastic chemotherapy: Secondary | ICD-10-CM | POA: Diagnosis not present

## 2021-01-17 LAB — CBC WITH DIFFERENTIAL (CANCER CENTER ONLY)
Abs Immature Granulocytes: 0.47 10*3/uL — ABNORMAL HIGH (ref 0.00–0.07)
Basophils Absolute: 0.1 10*3/uL (ref 0.0–0.1)
Basophils Relative: 0 %
Eosinophils Absolute: 0.2 10*3/uL (ref 0.0–0.5)
Eosinophils Relative: 1 %
HCT: 29.9 % — ABNORMAL LOW (ref 39.0–52.0)
Hemoglobin: 10 g/dL — ABNORMAL LOW (ref 13.0–17.0)
Immature Granulocytes: 3 %
Lymphocytes Relative: 14 %
Lymphs Abs: 2.4 10*3/uL (ref 0.7–4.0)
MCH: 32.7 pg (ref 26.0–34.0)
MCHC: 33.4 g/dL (ref 30.0–36.0)
MCV: 97.7 fL (ref 80.0–100.0)
Monocytes Absolute: 1.2 10*3/uL — ABNORMAL HIGH (ref 0.1–1.0)
Monocytes Relative: 7 %
Neutro Abs: 13.6 10*3/uL — ABNORMAL HIGH (ref 1.7–7.7)
Neutrophils Relative %: 75 %
Platelet Count: 128 10*3/uL — ABNORMAL LOW (ref 150–400)
RBC: 3.06 MIL/uL — ABNORMAL LOW (ref 4.22–5.81)
RDW: 14.8 % (ref 11.5–15.5)
WBC Count: 18.1 10*3/uL — ABNORMAL HIGH (ref 4.0–10.5)
nRBC: 0.1 % (ref 0.0–0.2)

## 2021-01-17 LAB — CMP (CANCER CENTER ONLY)
ALT: 9 U/L (ref 0–44)
AST: 18 U/L (ref 15–41)
Albumin: 3 g/dL — ABNORMAL LOW (ref 3.5–5.0)
Alkaline Phosphatase: 184 U/L — ABNORMAL HIGH (ref 38–126)
Anion gap: 7 (ref 5–15)
BUN: 22 mg/dL — ABNORMAL HIGH (ref 6–20)
CO2: 28 mmol/L (ref 22–32)
Calcium: 8.5 mg/dL — ABNORMAL LOW (ref 8.9–10.3)
Chloride: 100 mmol/L (ref 98–111)
Creatinine: 1.54 mg/dL — ABNORMAL HIGH (ref 0.61–1.24)
GFR, Estimated: 52 mL/min — ABNORMAL LOW (ref >60)
Glucose, Bld: 240 mg/dL — ABNORMAL HIGH (ref 70–99)
Potassium: 3.4 mmol/L — ABNORMAL LOW (ref 3.5–5.1)
Sodium: 135 mmol/L (ref 135–145)
Total Bilirubin: 0.3 mg/dL (ref 0.3–1.2)
Total Protein: 6.2 g/dL — ABNORMAL LOW (ref 6.5–8.1)

## 2021-01-17 LAB — CEA (ACCESS): CEA (CHCC): 4.54 ng/mL (ref 0.00–5.00)

## 2021-01-17 MED ORDER — PALONOSETRON HCL INJECTION 0.25 MG/5ML
0.2500 mg | Freq: Once | INTRAVENOUS | Status: AC
  Administered 2021-01-17: 0.25 mg via INTRAVENOUS
  Filled 2021-01-17: qty 5

## 2021-01-17 MED ORDER — LEUCOVORIN CALCIUM INJECTION 350 MG
400.0000 mg/m2 | Freq: Once | INTRAVENOUS | Status: AC
  Administered 2021-01-17: 832 mg via INTRAVENOUS
  Filled 2021-01-17: qty 41.6

## 2021-01-17 MED ORDER — SODIUM CHLORIDE 0.9 % IV SOLN
10.0000 mg | Freq: Once | INTRAVENOUS | Status: AC
  Administered 2021-01-17: 10 mg via INTRAVENOUS
  Filled 2021-01-17: qty 1

## 2021-01-17 MED ORDER — DEXTROSE 5 % IV SOLN: Freq: Once | INTRAVENOUS | Status: AC

## 2021-01-17 MED ORDER — OXALIPLATIN CHEMO INJECTION 100 MG/20ML
65.0000 mg/m2 | Freq: Once | INTRAVENOUS | Status: AC
  Administered 2021-01-17: 135 mg via INTRAVENOUS
  Filled 2021-01-17: qty 20

## 2021-01-17 MED ORDER — SODIUM CHLORIDE 0.9 % IV SOLN
150.0000 mg | Freq: Once | INTRAVENOUS | Status: AC
  Administered 2021-01-17: 150 mg via INTRAVENOUS
  Filled 2021-01-17: qty 5

## 2021-01-17 MED ORDER — FLUOROURACIL CHEMO INJECTION 5 GM/100ML
2400.0000 mg/m2 | INTRAVENOUS | Status: DC
  Administered 2021-01-17: 5000 mg via INTRAVENOUS
  Filled 2021-01-17: qty 100

## 2021-01-17 NOTE — Progress Notes (Signed)
Per Ned Card, NP ok to treat with cr 1.54

## 2021-01-17 NOTE — Patient Instructions (Addendum)
The chemotherapy medication bag should finish at 46 hours, 96 hours, or 7 days. For example, if your pump is scheduled for 46 hours and it was put on at 4:00 p.m., it should finish at 2:00 p.m. the day it is scheduled to come off regardless of your appointment time.     Estimated time to finish at 1200 Wednesday.   If the display on your pump reads "Low Volume" and it is beeping, take the batteries out of the pump and come to the cancer center for it to be taken off.   If the pump alarms go off prior to the pump reading "Low Volume" then call [phone] and someone can assist you.  If the plunger comes out and the chemotherapy medication is leaking out, please use your home chemo spill kit to clean up the spill. Do NOT use paper towels or other household products.  If you have problems or questions regarding your pump, please call either [phone] (24 hours a day) or the cancer center Monday-Friday 8:00 a.m.- 4:30 p.m. at the clinic number and we will assist you. If you are unable to get assistance, then go to the nearest Emergency Department and ask the staff to contact the IV team for assistance.    [facility]  Discharge Instructions: Thank you for choosing [facility] to provide your oncology and hematology care.   If you have a lab appointment with the [facility], please go directly to the [facility] and check in at the registration area.   Wear comfortable clothing and clothing appropriate for easy access to any Portacath or PICC line.   We strive to give you quality time with your provider. You may need to reschedule your appointment if you arrive late (15 or more minutes).  Arriving late affects you and other patients whose appointments are after yours.  Also, if you miss three or more appointments without notifying the office, you may be dismissed from the clinic at the provider's discretion.      For prescription refill  requests, have your pharmacy contact our office and allow 72 hours for refills to be completed.    Today you received the following chemotherapy and/or immunotherapy agents oxaliplatin, leucovorin, fluorouracil   To help prevent nausea and vomiting after your treatment, we encourage you to take your nausea medication as directed.  BELOW ARE SYMPTOMS THAT SHOULD BE REPORTED IMMEDIATELY: *FEVER GREATER THAN 100.4 F (38 C) OR HIGHER *CHILLS OR SWEATING *NAUSEA AND VOMITING THAT IS NOT CONTROLLED WITH YOUR NAUSEA MEDICATION *UNUSUAL SHORTNESS OF BREATH *UNUSUAL BRUISING OR BLEEDING *URINARY PROBLEMS (pain or burning when urinating, or frequent urination) *BOWEL PROBLEMS (unusual diarrhea, constipation, pain near the anus) TENDERNESS IN MOUTH AND THROAT WITH OR WITHOUT PRESENCE OF ULCERS (sore throat, sores in mouth, or a toothache) UNUSUAL RASH, SWELLING OR PAIN  UNUSUAL VAGINAL DISCHARGE OR ITCHING   Items with * indicate a potential emergency and should be followed up as soon as possible or go to the Emergency Department if any problems should occur.  Please show the CHEMOTHERAPY ALERT CARD or IMMUNOTHERAPY ALERT CARD at check-in to the Emergency Department and triage nurse.  Should you have questions after your visit or need to cancel or reschedule your appointment, please contact [facility]  Dept: [phone]  and follow the prompts.  Office hours are 8:00 a.m. to 4:30 p.m. Monday - Friday. Please note that voicemails left after 4:00 p.m. may  not be returned until the following business day.  We are closed weekends and major holidays. You have access to a nurse at all times for urgent questions. Please call the main number to the clinic Dept: [phone] and follow the prompts.   For any non-urgent questions, you may also contact your provider using MyChart. We now offer e-Visits for anyone 41 and older to request care online for non-urgent symptoms. For  details visit [email or web address].   Also download the MyChart app! Go to the app store, search "MyChart", open the app, select [facility], and log in with your MyChart username and password.  Due to Covid, a mask is required upon entering the hospital/clinic. If you do not have a mask, one will be given to you upon arrival. For doctor visits, patients may have 1 support person aged 35 or older with them. For treatment visits, patients cannot have anyone with them due to current Covid guidelines and our immunocompromised population.   Oxaliplatin Injection What is this medication? OXALIPLATIN (ox AL i PLA tin) is a chemotherapy drug. It targets fast dividing cells, like cancer cells, and causes these cells to die. This medicine is usedto treat cancers of the colon and rectum, and many other cancers. This medicine may be used for other purposes; ask your health care provider orpharmacist if you have questions. COMMON BRAND NAME(S): Eloxatin What should I tell my care team before I take this medication? They need to know if you have any of these conditions: heart disease history of irregular heartbeat liver disease low blood counts, like white cells, platelets, or red blood cells lung or breathing disease, like asthma take medicines that treat or prevent blood clots tingling of the fingers or toes, or other nerve disorder an unusual or allergic reaction to oxaliplatin, other chemotherapy, other medicines, foods, dyes, or preservatives pregnant or trying to get pregnant breast-feeding How should I use this medication? This drug is given as an infusion into a vein. It is administered in a hospitalor clinic by a specially trained health care professional. Talk to your pediatrician regarding the use of this medicine in children.Special care may be needed. Overdosage: If you think you have taken too much of this medicine contact apoison control center or emergency room at once. NOTE: This  medicine is only for you. Do not share this medicine with others. What if I miss a dose? It is important not to miss a dose. Call your doctor or health careprofessional if you are unable to keep an appointment. What may interact with this medication? Do not take this medicine with any of the following medications: cisapride dronedarone pimozide thioridazine This medicine may also interact with the following medications: aspirin and aspirin-like medicines certain medicines that treat or prevent blood clots like warfarin, apixaban, dabigatran, and rivaroxaban cisplatin cyclosporine diuretics medicines for infection like acyclovir, adefovir, amphotericin B, bacitracin, cidofovir, foscarnet, ganciclovir, gentamicin, pentamidine, vancomycin NSAIDs, medicines for pain and inflammation, like ibuprofen or naproxen other medicines that prolong the QT interval (an abnormal heart rhythm) pamidronate zoledronic acid This list may not describe all possible interactions. Give your health care provider a list of all the medicines, herbs, non-prescription drugs, or dietary supplements you use. Also tell them if you smoke, drink alcohol, or use illegaldrugs. Some items may interact with your medicine. What should I watch for while using this medication? Your condition will be monitored carefully while you are receiving thismedicine. You may need blood work done while you are taking this medicine.  This medicine may make you feel generally unwell. This is not uncommon as chemotherapy can affect healthy cells as well as cancer cells. Report any side effects. Continue your course of treatment even though you feel ill unless yourhealthcare professional tells you to stop. This medicine can make you more sensitive to cold. Do not drink cold drinks or use ice. Cover exposed skin before coming in contact with cold temperatures or cold objects. When out in cold weather wear warm clothing and cover your mouth and nose  to warm the air that goes into your lungs. Tell your doctor if you getsensitive to the cold. Do not become pregnant while taking this medicine or for 9 months after stopping it. Women should inform their health care professional if they wish to become pregnant or think they might be pregnant. Men should not father a child while taking this medicine and for 6 months after stopping it. There is potential for serious side effects to an unborn child. Talk to your health careprofessional for more information. Do not breast-feed a child while taking this medicine or for 3 months afterstopping it. This medicine has caused ovarian failure in some women. This medicine may make it more difficult to get pregnant. Talk to your health care professional if [place] concerned about your fertility. This medicine has caused decreased sperm counts in some men. This may make it more difficult to father a child. Talk to your health care professional if [place] concerned about your fertility. This medicine may increase your risk of getting an infection. Call your health care professional for advice if you get a fever, chills, or sore throat, or other symptoms of a cold or flu. Do not treat yourself. Try to avoid beingaround people who are sick. Avoid taking medicines that contain aspirin, acetaminophen, ibuprofen, naproxen, or ketoprofen unless instructed by your health care professional.These medicines may hide a fever. Be careful brushing or flossing your teeth or using a toothpick because you may get an infection or bleed more easily. If you have any dental work done, Primary school teacher you are receiving this medicine. What side effects may I notice from receiving this medication? Side effects that you should report to your doctor or health care professionalas soon as possible: allergic reactions like skin rash, itching or hives, swelling of the face, lips, or tongue breathing problems cough low blood counts - this medicine  may decrease the number of white blood cells, red blood cells, and platelets. You may be at increased risk for infections and bleeding nausea, vomiting pain, redness, or irritation at site where injected pain, tingling, numbness in the hands or feet signs and symptoms of bleeding such as bloody or black, tarry stools; red or dark brown urine; spitting up blood or brown material that looks like coffee grounds; red spots on the skin; unusual bruising or bleeding from the eyes, gums, or nose signs and symptoms of a dangerous change in heartbeat or heart rhythm like chest pain; dizziness; fast, irregular heartbeat; palpitations; feeling faint or lightheaded; falls signs and symptoms of infection like fever; chills; cough; sore throat; pain or trouble passing urine signs and symptoms of liver injury like dark yellow or brown urine; general ill feeling or flu-like symptoms; light-colored stools; loss of appetite; nausea; right upper belly pain; unusually weak or tired; yellowing of the eyes or skin signs and symptoms of low red blood cells or anemia such as unusually weak or tired; feeling faint or lightheaded; falls signs and symptoms of muscle injury  like dark urine; trouble passing urine or change in the amount of urine; unusually weak or tired; muscle pain; back pain Side effects that usually do not require medical attention (report to yourdoctor or health care professional if they continue or are bothersome): changes in taste diarrhea gas hair loss loss of appetite mouth sores This list may not describe all possible side effects. Call your doctor for medical advice about side effects. You may report side effects to FDA at1-800-FDA-1088. Where should I keep my medication? This drug is given in a hospital or clinic and will not be stored at home. NOTE: This sheet is a summary. It may not cover all possible information. If you have questions about this medicine, talk to your doctor, pharmacist,  orhealth care provider.  2022 Elsevier/Gold Standard (2018-09-25 12:20:35)  Leucovorin injection What is this medication? LEUCOVORIN (loo koe VOR in) is used to prevent or treat the harmful effects of some medicines. This medicine is used to treat anemia caused by a low amount of folic acid in the body. It is also used with 5-fluorouracil (5-FU) to treatcolon cancer. This medicine may be used for other purposes; ask your health care provider orpharmacist if you have questions. What should I tell my care team before I take this medication? They need to know if you have any of these conditions: anemia from low levels of vitamin B-12 in the blood an unusual or allergic reaction to leucovorin, folic acid, other medicines, foods, dyes, or preservatives pregnant or trying to get pregnant breast-feeding How should I use this medication? This medicine is for injection into a muscle or into a vein. It is given by ahealth care professional in a hospital or clinic setting. Talk to your pediatrician regarding the use of this medicine in children.Special care may be needed. Overdosage: If you think you have taken too much of this medicine contact apoison control center or emergency room at once. NOTE: This medicine is only for you. Do not share this medicine with others. What if I miss a dose? This does not apply. What may interact with this medication? capecitabine fluorouracil phenobarbital phenytoin primidone trimethoprim-sulfamethoxazole This list may not describe all possible interactions. Give your health care provider a list of all the medicines, herbs, non-prescription drugs, or dietary supplements you use. Also tell them if you smoke, drink alcohol, or use illegaldrugs. Some items may interact with your medicine. What should I watch for while using this medication? Your condition will be monitored carefully while you are receiving thismedicine. This medicine may increase the side effects  of 5-fluorouracil, 5-FU. Tell your doctor or health care professional if you have diarrhea or mouth sores that donot get better or that get worse. What side effects may I notice from receiving this medication? Side effects that you should report to your doctor or health care professionalas soon as possible: allergic reactions like skin rash, itching or hives, swelling of the face, lips, or tongue breathing problems fever, infection mouth sores unusual bleeding or bruising unusually weak or tired Side effects that usually do not require medical attention (report to yourdoctor or health care professional if they continue or are bothersome): constipation or diarrhea loss of appetite nausea, vomiting This list may not describe all possible side effects. Call your doctor for medical advice about side effects. You may report side effects to FDA at1-800-FDA-1088. Where should I keep my medication? This drug is given in a hospital or clinic and will not be stored at home. NOTE: This  sheet is a summary. It may not cover all possible information. If you have questions about this medicine, talk to your doctor, pharmacist, orhealth care provider.  2022 Elsevier/Gold Standard (2007-11-12 16:50:29)  Fluorouracil, 5-FU injection What is this medication? FLUOROURACIL, 5-FU (flure oh YOOR a sil) is a chemotherapy drug. It slows the growth of cancer cells. This medicine is used to treat many types of cancer like breast cancer, colon or rectal cancer, pancreatic cancer, and stomachcancer. This medicine may be used for other purposes; ask your health care provider orpharmacist if you have questions. COMMON BRAND NAME(S): Adrucil What should I tell my care team before I take this medication? They need to know if you have any of these conditions: blood disorders dihydropyrimidine dehydrogenase (DPD) deficiency infection (especially a virus infection such as chickenpox, cold sores, or herpes) kidney  disease liver disease malnourished, poor nutrition recent or ongoing radiation therapy an unusual or allergic reaction to fluorouracil, other chemotherapy, other medicines, foods, dyes, or preservatives pregnant or trying to get pregnant breast-feeding How should I use this medication? This drug is given as an infusion or injection into a vein. It is administeredin a hospital or clinic by a specially trained health care professional. Talk to your pediatrician regarding the use of this medicine in children.Special care may be needed. Overdosage: If you think you have taken too much of this medicine contact apoison control center or emergency room at once. NOTE: This medicine is only for you. Do not share this medicine with others. What if I miss a dose? It is important not to miss your dose. Call your doctor or health careprofessional if you are unable to keep an appointment. What may interact with this medication? Do not take this medicine with any of the following medications: live virus vaccines This medicine may also interact with the following medications: medicines that treat or prevent blood clots like warfarin, enoxaparin, and dalteparin This list may not describe all possible interactions. Give your health care provider a list of all the medicines, herbs, non-prescription drugs, or dietary supplements you use. Also tell them if you smoke, drink alcohol, or use illegaldrugs. Some items may interact with your medicine. What should I watch for while using this medication? Visit your doctor for checks on your progress. This drug may make you feel generally unwell. This is not uncommon, as chemotherapy can affect healthy cells as well as cancer cells. Report any side effects. Continue your course oftreatment even though you feel ill unless your doctor tells you to stop. In some cases, you may be given additional medicines to help with side effects.Follow all directions for their use. Call  your doctor or health care professional for advice if you get a fever, chills or sore throat, or other symptoms of a cold or flu. Do not treat yourself. This drug decreases your body's ability to fight infections. Try toavoid being around people who are sick. This medicine may increase your risk to bruise or bleed. Call your doctor orhealth care professional if you notice any unusual bleeding. Be careful brushing and flossing your teeth or using a toothpick because you may get an infection or bleed more easily. If you have any dental work done,tell your dentist you are receiving this medicine. Avoid taking products that contain aspirin, acetaminophen, ibuprofen, naproxen, or ketoprofen unless instructed by your doctor. These medicines may hide afever. Do not become pregnant while taking this medicine. Women should inform their doctor if they wish to become pregnant or think  they might be pregnant. There is a potential for serious side effects to an unborn child. Talk to your health care professional or pharmacist for more information. Do not breast-feed aninfant while taking this medicine. Men should inform their doctor if they wish to father a child. This medicinemay lower sperm counts. Do not treat diarrhea with over the counter products. Contact your doctor ifyou have diarrhea that lasts more than 2 days or if it is severe and watery. This medicine can make you more sensitive to the sun. Keep out of the sun. If you cannot avoid being in the sun, wear protective clothing and use sunscreen.Do not use sun lamps or tanning beds/booths. What side effects may I notice from receiving this medication? Side effects that you should report to your doctor or health care professionalas soon as possible: allergic reactions like skin rash, itching or hives, swelling of the face, lips, or tongue low blood counts - this medicine may decrease the number of white blood cells, red blood cells and platelets. You may be at  increased risk for infections and bleeding. signs of infection - fever or chills, cough, sore throat, pain or difficulty passing urine signs of decreased platelets or bleeding - bruising, pinpoint red spots on the skin, black, tarry stools, blood in the urine signs of decreased red blood cells - unusually weak or tired, fainting spells, lightheadedness breathing problems changes in vision chest pain mouth sores nausea and vomiting pain, swelling, redness at site where injected pain, tingling, numbness in the hands or feet redness, swelling, or sores on hands or feet stomach pain unusual bleeding Side effects that usually do not require medical attention (report to yourdoctor or health care professional if they continue or are bothersome): changes in finger or toe nails diarrhea dry or itchy skin hair loss headache loss of appetite sensitivity of eyes to the light stomach upset unusually teary eyes This list may not describe all possible side effects. Call your doctor for medical advice about side effects. You may report side effects to FDA at1-800-FDA-1088. Where should I keep my medication? This drug is given in a hospital or clinic and will not be stored at home. NOTE: This sheet is a summary. It may not cover all possible information. If you have questions about this medicine, talk to your doctor, pharmacist, orhealth care provider.  2022 Elsevier/Gold Standard (2019-04-08 15:00:03)

## 2021-01-17 NOTE — Progress Notes (Signed)
  [facility] OFFICE PROGRESS NOTE   Diagnosis: Colon cancer  INTERVAL HISTORY:   Jeffrey Young returns as scheduled.  He completed cycle 9 FOLFOX 01/03/2021.  He reports a single episode of nausea/vomiting.  No mouth sores.  No diarrhea.  He has mild persistent cold sensitivity.  No numbness or tingling and related to cold exposure.  No difficulty buttoning his shirt or tying his shoes.  Objective:  Vital signs in last 24 hours:  Blood pressure (!) 145/87, pulse 89, temperature 97.7 F (36.5 C), temperature source Oral, resp. rate 18, height $RemoveBe'6\' 1"'avingOHJM$  (1.854 m), weight 190 lb 6.4 oz (86.4 kg), SpO2 100 %.    HEENT: White coating of her tongue.  No buccal thrush. Resp: Lungs clear bilaterally. Cardio: Regular rate and rhythm. GI: Abdomen soft and nontender.  No hepatomegaly. Vascular: No leg edema. Neuro: Vibratory sense mildly decreased over the fingertips per tuning fork exam. Skin: Palms without erythema. Port-A-Cath without erythema.   Lab Results:  Lab Results  Component Value Date   WBC 6.3 01/03/2021   HGB 9.8 (L) 01/03/2021   HCT 28.6 (L) 01/03/2021   MCV 96.6 01/03/2021   PLT 188 01/03/2021   NEUTROABS 3.7 01/03/2021    Imaging:  No results found.  Medications: I have reviewed the patient's current medications.  Assessment/Plan: Descending colon cancer, stage IIIc (T3N2b M0), status post a partial left colectomy 07/30/2020, 9/16 lymph nodes positive, lymphovascular invasion, 1 satellite nodule, negative margins, MSS, no loss of mismatch repair protein expression -History of large polyp in the left side of the colon-referred to [facility] in 05/2018 for procedure canceled secondary to COVID-19 pandemic.  Procedure was not rescheduled. -CT chest/abdomen/pelvis with contrast 07/27/2020-3 small pulmonary nodules less than 5 mm favored to be benign, circumferential luminal narrowing of the distal transverse colon concerning for malignancy, no metastatic adenopathy in  the mesentery porta hepatis, no for metastasis. -CEA on 07/27/2020 was 17.3; 37 on 08/30/2020; 33 on 09/13/2020 -Colonoscopy performed 07/27/2020 showed a fungating, infiltrative and ulcerated nonobstructing large mass in the proximal descending colon.  Biopsy-adenocarcinoma -Cycle 1 FOLFOX 08/30/2020 -Cycle 2 FOLFOX 09/13/2020, Emend added for delayed nausea -Cycle 3 FOLFOX 09/27/2020 -Cycle 4 FOLFOX 10/11/2020 -Cycle 5 FOLFOX 11/08/2020 -Cycle 6 FOLFOX 11/23/2020 -CT 12/03/2020-prior 3 mm left apical nodule no longer seen, no new/suspicious pulmonary nodules, no evidence of metastatic disease -Cycle 7 FOLFOX 12/06/2020 -Cycle 8 FOLFOX 12/21/2020 -Cycle 9 FOLFOX 01/03/2021 -Cycle 10 FOLFOX 01/17/2021 2.  Anemia due to GI bleeding, iron deficiency?,  Renal insufficiency? 3.  New onset acute diastolic CHF March 6808 4.  Diabetes mellitus 5.  Renal insufficiency 6.  Hypertension 7.  History of left transmetatarsal amputation 8.  History of colon polyps 9.  Neuropathy 10.  Delayed nausea secondary to chemotherapy-Decadron prophylaxis added following cycle 7 FOLFOX (he did not take)    Disposition: [name] appears stable.  He has completed 9 cycles of FOLFOX.  Plan to proceed with cycle 10 today as scheduled pending adequate labs.  He and his wife understand Oxaliplatin will likely be eliminated from the regimen with the final 2 cycles.  He will return for lab, follow-up, cycle 11 FOLFOX in 2 weeks.  He will contact the office in the interim with any problems.  Plan reviewed with Dr. Benay Spice.   Ned Card ANP/GNP-BC   01/17/2021  8:44 AM

## 2021-01-18 ENCOUNTER — Encounter: Payer: Self-pay | Admitting: Oncology

## 2021-01-19 ENCOUNTER — Encounter: Payer: Self-pay | Admitting: *Deleted

## 2021-01-19 ENCOUNTER — Inpatient Hospital Stay: Payer: 59

## 2021-01-19 ENCOUNTER — Other Ambulatory Visit: Payer: Self-pay

## 2021-01-19 VITALS — BP 153/94 | HR 85 | Temp 98.7°F | Resp 18

## 2021-01-19 DIAGNOSIS — C185 Malignant neoplasm of splenic flexure: Secondary | ICD-10-CM

## 2021-01-19 DIAGNOSIS — Z5111 Encounter for antineoplastic chemotherapy: Secondary | ICD-10-CM | POA: Diagnosis not present

## 2021-01-19 MED ORDER — HEPARIN SOD (PORK) LOCK FLUSH 100 UNIT/ML IV SOLN
500.0000 [IU] | Freq: Once | INTRAVENOUS | Status: AC | PRN
  Administered 2021-01-19: 500 [IU]

## 2021-01-19 MED ORDER — SODIUM CHLORIDE 0.9% FLUSH
10.0000 mL | INTRAVENOUS | Status: DC | PRN
  Administered 2021-01-19: 10 mL

## 2021-01-19 MED ORDER — PEGFILGRASTIM-BMEZ 6 MG/0.6ML ~~LOC~~ SOSY
6.0000 mg | PREFILLED_SYRINGE | Freq: Once | SUBCUTANEOUS | Status: AC
  Administered 2021-01-19: 6 mg via SUBCUTANEOUS

## 2021-01-19 NOTE — Patient Instructions (Signed)

## 2021-01-19 NOTE — Progress Notes (Signed)
Faxed office note/labs from 01/17/21 to [facility] [phone]

## 2021-01-24 ENCOUNTER — Other Ambulatory Visit: Payer: Self-pay | Admitting: Oncology

## 2021-01-26 ENCOUNTER — Telehealth: Payer: Self-pay | Admitting: Orthopedic Surgery

## 2021-01-26 NOTE — Telephone Encounter (Signed)
Received call from patient that he would like copy of his records. After advising him that he needs to sign release form, he asked that I email form to him. I did so to [email or web address]

## 2021-01-31 ENCOUNTER — Other Ambulatory Visit: Payer: Self-pay

## 2021-01-31 ENCOUNTER — Inpatient Hospital Stay: Payer: 59

## 2021-01-31 ENCOUNTER — Inpatient Hospital Stay (HOSPITAL_BASED_OUTPATIENT_CLINIC_OR_DEPARTMENT_OTHER): Payer: 59 | Admitting: Oncology

## 2021-01-31 ENCOUNTER — Inpatient Hospital Stay: Payer: 59 | Attending: Oncology

## 2021-01-31 VITALS — BP 137/84 | HR 78 | Temp 98.1°F | Resp 20 | Ht 73.0 in | Wt 187.2 lb

## 2021-01-31 DIAGNOSIS — C186 Malignant neoplasm of descending colon: Secondary | ICD-10-CM | POA: Diagnosis present

## 2021-01-31 DIAGNOSIS — Z79899 Other long term (current) drug therapy: Secondary | ICD-10-CM | POA: Diagnosis not present

## 2021-01-31 DIAGNOSIS — I110 Hypertensive heart disease with heart failure: Secondary | ICD-10-CM | POA: Insufficient documentation

## 2021-01-31 DIAGNOSIS — D500 Iron deficiency anemia secondary to blood loss (chronic): Secondary | ICD-10-CM | POA: Insufficient documentation

## 2021-01-31 DIAGNOSIS — C185 Malignant neoplasm of splenic flexure: Secondary | ICD-10-CM | POA: Diagnosis not present

## 2021-01-31 DIAGNOSIS — Z452 Encounter for adjustment and management of vascular access device: Secondary | ICD-10-CM | POA: Insufficient documentation

## 2021-01-31 DIAGNOSIS — Z8719 Personal history of other diseases of the digestive system: Secondary | ICD-10-CM | POA: Insufficient documentation

## 2021-01-31 DIAGNOSIS — E1140 Type 2 diabetes mellitus with diabetic neuropathy, unspecified: Secondary | ICD-10-CM | POA: Diagnosis not present

## 2021-01-31 DIAGNOSIS — N289 Disorder of kidney and ureter, unspecified: Secondary | ICD-10-CM | POA: Insufficient documentation

## 2021-01-31 DIAGNOSIS — K922 Gastrointestinal hemorrhage, unspecified: Secondary | ICD-10-CM | POA: Diagnosis not present

## 2021-01-31 DIAGNOSIS — Z9221 Personal history of antineoplastic chemotherapy: Secondary | ICD-10-CM | POA: Insufficient documentation

## 2021-01-31 DIAGNOSIS — Z794 Long term (current) use of insulin: Secondary | ICD-10-CM | POA: Diagnosis not present

## 2021-01-31 DIAGNOSIS — Z5111 Encounter for antineoplastic chemotherapy: Secondary | ICD-10-CM | POA: Insufficient documentation

## 2021-01-31 LAB — CMP (CANCER CENTER ONLY)
ALT: 10 U/L (ref 0–44)
AST: 19 U/L (ref 15–41)
Albumin: 3.3 g/dL — ABNORMAL LOW (ref 3.5–5.0)
Alkaline Phosphatase: 179 U/L — ABNORMAL HIGH (ref 38–126)
Anion gap: 10 (ref 5–15)
BUN: 15 mg/dL (ref 6–20)
CO2: 27 mmol/L (ref 22–32)
Calcium: 8.7 mg/dL — ABNORMAL LOW (ref 8.9–10.3)
Chloride: 104 mmol/L (ref 98–111)
Creatinine: 1.34 mg/dL — ABNORMAL HIGH (ref 0.61–1.24)
GFR, Estimated: >60 mL/min (ref >60)
Glucose, Bld: 135 mg/dL — ABNORMAL HIGH (ref 70–99)
Potassium: 3.4 mmol/L — ABNORMAL LOW (ref 3.5–5.1)
Sodium: 141 mmol/L (ref 135–145)
Total Bilirubin: 0.4 mg/dL (ref 0.3–1.2)
Total Protein: 6.3 g/dL — ABNORMAL LOW (ref 6.5–8.1)

## 2021-01-31 LAB — CBC WITH DIFFERENTIAL (CANCER CENTER ONLY)
Abs Immature Granulocytes: 0.5 10*3/uL — ABNORMAL HIGH (ref 0.00–0.07)
Basophils Absolute: 0.1 10*3/uL (ref 0.0–0.1)
Basophils Relative: 1 %
Eosinophils Absolute: 0.2 10*3/uL (ref 0.0–0.5)
Eosinophils Relative: 1 %
HCT: 31.7 % — ABNORMAL LOW (ref 39.0–52.0)
Hemoglobin: 10.6 g/dL — ABNORMAL LOW (ref 13.0–17.0)
Immature Granulocytes: 3 %
Lymphocytes Relative: 14 %
Lymphs Abs: 2.6 10*3/uL (ref 0.7–4.0)
MCH: 32.5 pg (ref 26.0–34.0)
MCHC: 33.4 g/dL (ref 30.0–36.0)
MCV: 97.2 fL (ref 80.0–100.0)
Monocytes Absolute: 1.5 10*3/uL — ABNORMAL HIGH (ref 0.1–1.0)
Monocytes Relative: 8 %
Neutro Abs: 13.5 10*3/uL — ABNORMAL HIGH (ref 1.7–7.7)
Neutrophils Relative %: 73 %
Platelet Count: 187 10*3/uL (ref 150–400)
RBC: 3.26 MIL/uL — ABNORMAL LOW (ref 4.22–5.81)
RDW: 14.6 % (ref 11.5–15.5)
WBC Count: 18.3 10*3/uL — ABNORMAL HIGH (ref 4.0–10.5)
nRBC: 0.2 % (ref 0.0–0.2)

## 2021-01-31 LAB — CEA (ACCESS): CEA (CHCC): 6.17 ng/mL — ABNORMAL HIGH (ref 0.00–5.00)

## 2021-01-31 MED ORDER — SODIUM CHLORIDE 0.9 % IV SOLN
2400.0000 mg/m2 | INTRAVENOUS | Status: DC
  Administered 2021-01-31: 5000 mg via INTRAVENOUS
  Filled 2021-01-31: qty 100

## 2021-01-31 NOTE — Patient Instructions (Signed)
[facility]   Discharge Instructions: Thank you for choosing [facility] to provide your oncology and hematology care.   If you have a lab appointment with the [facility], please go directly to the [facility] and check in at the registration area.   Wear comfortable clothing and clothing appropriate for easy access to any Portacath or PICC line.   We strive to give you quality time with your provider. You may need to reschedule your appointment if you arrive late (15 or more minutes).  Arriving late affects you and other patients whose appointments are after yours.  Also, if you miss three or more appointments without notifying the office, you may be dismissed from the clinic at the provider's discretion.      For prescription refill requests, have your pharmacy contact our office and allow 72 hours for refills to be completed.    Today you received the following chemotherapy and/or immunotherapy agents Flourouracil (ADRUCIL).      To help prevent nausea and vomiting after your treatment, we encourage you to take your nausea medication as directed.  BELOW ARE SYMPTOMS THAT SHOULD BE REPORTED IMMEDIATELY: *FEVER GREATER THAN 100.4 F (38 C) OR HIGHER *CHILLS OR SWEATING *NAUSEA AND VOMITING THAT IS NOT CONTROLLED WITH YOUR NAUSEA MEDICATION *UNUSUAL SHORTNESS OF BREATH *UNUSUAL BRUISING OR BLEEDING *URINARY PROBLEMS (pain or burning when urinating, or frequent urination) *BOWEL PROBLEMS (unusual diarrhea, constipation, pain near the anus) TENDERNESS IN MOUTH AND THROAT WITH OR WITHOUT PRESENCE OF ULCERS (sore throat, sores in mouth, or a toothache) UNUSUAL RASH, SWELLING OR PAIN  UNUSUAL VAGINAL DISCHARGE OR ITCHING   Items with * indicate a potential emergency and should be followed up as soon as possible or go to the Emergency Department if any problems should occur.  Please show the CHEMOTHERAPY ALERT CARD or IMMUNOTHERAPY ALERT CARD at  check-in to the Emergency Department and triage nurse.  Should you have questions after your visit or need to cancel or reschedule your appointment, please contact [facility]  Dept: [phone]  and follow the prompts.  Office hours are 8:00 a.m. to 4:30 p.m. Monday - Friday. Please note that voicemails left after 4:00 p.m. may not be returned until the following business day.  We are closed weekends and major holidays. You have access to a nurse at all times for urgent questions. Please call the main number to the clinic Dept: [phone] and follow the prompts.   For any non-urgent questions, you may also contact your provider using MyChart. We now offer e-Visits for anyone 57 and older to request care online for non-urgent symptoms. For details visit [email or web address].   Also download the MyChart app! Go to the app store, search "MyChart", open the app, select [facility], and log in with your MyChart username and password.  Due to Covid, a mask is required upon entering the hospital/clinic. If you do not have a mask, one will be given to you upon arrival. For doctor visits, patients may have 1 support person aged 57 or older with them. For treatment visits, patients cannot have anyone with them due to current Covid guidelines and our immunocompromised population.   The chemotherapy medication bag should finish at 46 hours, 96 hours, or 7 days. For example, if your pump is scheduled for 46 hours and it was put on at 4:00 p.m., it should finish at 2:00 p.m. the day it is scheduled to come off regardless  of your appointment time.     Estimated time to finish at 0930 on Wednesday 02/02/2021.   If the display on your pump reads "Low Volume" and it is beeping, take the batteries out of the pump and come to the cancer center for it to be taken off.   If the pump alarms go off prior to the pump reading "Low Volume" then call [phone] and someone can assist  you.  If the plunger comes out and the chemotherapy medication is leaking out, please use your home chemo spill kit to clean up the spill. Do NOT use paper towels or other household products.  If you have problems or questions regarding your pump, please call either [phone] (24 hours a day) or the cancer center Monday-Friday 8:00 a.m.- 4:30 p.m. at the clinic number and we will assist you. If you are unable to get assistance, then go to the nearest Emergency Department and ask the staff to contact the IV team for assistance.

## 2021-01-31 NOTE — Progress Notes (Signed)
[facility] OFFICE PROGRESS NOTE   Diagnosis: Colon cancer  INTERVAL HISTORY:   History of pressure complete another cycle of FOLFOX 01/03/2021.  No nausea/vomiting, mouth sores, or diarrhea.  He has persistent cold sensitivity in the hands.  No numbness.  No difficulty with fine motor activities.  He reports chronic pain in the lower back.  He is applying for service-connected benefits at the [place].  Objective:  Vital signs in last 24 hours:  Blood pressure 137/84, pulse 78, temperature 98.1 F (36.7 C), temperature source Oral, resp. rate 20, height $RemoveBe'6\' 1"'laiCZgQCS$  (1.854 m), weight 187 lb 3.2 oz (84.9 kg), SpO2 100 %.    HEENT: White coat over the tongue, no buccal thrush or ulcers Resp: Lungs clear bilaterally Cardio: Regular rate and rhythm GI: No hepatosplenomegaly, nontender Vascular: No leg edema Neuro: Mild to moderate loss of vibratory sense at the fingertips bilaterall   Portacath/PICC-without erythema  Lab Results:  Lab Results  Component Value Date   WBC 18.3 (H) 01/31/2021   HGB 10.6 (L) 01/31/2021   HCT 31.7 (L) 01/31/2021   MCV 97.2 01/31/2021   PLT 187 01/31/2021   NEUTROABS 13.5 (H) 01/31/2021    CMP  Lab Results  Component Value Date   NA 135 01/17/2021   K 3.4 (L) 01/17/2021   CL 100 01/17/2021   CO2 28 01/17/2021   GLUCOSE 240 (H) 01/17/2021   BUN 22 (H) 01/17/2021   CREATININE 1.54 (H) 01/17/2021   CALCIUM 8.5 (L) 01/17/2021   PROT 6.2 (L) 01/17/2021   ALBUMIN 3.0 (L) 01/17/2021   AST 18 01/17/2021   ALT 9 01/17/2021   ALKPHOS 184 (H) 01/17/2021   BILITOT 0.3 01/17/2021   GFRNONAA 52 (L) 01/17/2021   GFRAA >60 01/26/2018    Lab Results  Component Value Date   CEA1 11.44 (H) 09/27/2020   CEA 4.54 01/17/2021     Medications: I have reviewed the patient's current medications.   Assessment/Plan: Descending colon cancer, stage IIIc (T3N2b M0), status post a partial left colectomy 07/30/2020, 9/16 lymph nodes positive,  lymphovascular invasion, 1 satellite nodule, negative margins, MSS, no loss of mismatch repair protein expression -History of large polyp in the left side of the colon-referred to [facility] in 05/2018 for procedure canceled secondary to COVID-19 pandemic.  Procedure was not rescheduled. -CT chest/abdomen/pelvis with contrast 07/27/2020-3 small pulmonary nodules less than 5 mm favored to be benign, circumferential luminal narrowing of the distal transverse colon concerning for malignancy, no metastatic adenopathy in the mesentery porta hepatis, no for metastasis. -CEA on 07/27/2020 was 17.3; 37 on 08/30/2020; 33 on 09/13/2020 -Colonoscopy performed 07/27/2020 showed a fungating, infiltrative and ulcerated nonobstructing large mass in the proximal descending colon.  Biopsy-adenocarcinoma -Cycle 1 FOLFOX 08/30/2020 -Cycle 2 FOLFOX 09/13/2020, Emend added for delayed nausea -Cycle 3 FOLFOX 09/27/2020 -Cycle 4 FOLFOX 10/11/2020 -Cycle 5 FOLFOX 11/08/2020 -Cycle 6 FOLFOX 11/23/2020 -CT 12/03/2020-prior 3 mm left apical nodule no longer seen, no new/suspicious pulmonary nodules, no evidence of metastatic disease -Cycle 7 FOLFOX 12/06/2020 -Cycle 8 FOLFOX 12/21/2020 -Cycle 9 FOLFOX 01/03/2021 -Cycle 10 FOLFOX 01/17/2021 -Cycle 11 FOLFOX 01/31/2021, oxaliplatin held secondary to neuropathy symptoms 2.  Anemia due to GI bleeding, iron deficiency?,  Renal insufficiency? 3.  New onset acute diastolic CHF March 7510 4.  Diabetes mellitus 5.  Renal insufficiency 6.  Hypertension 7.  History of left transmetatarsal amputation 8.  History of colon polyps 9.  Neuropathy 10.  Delayed nausea secondary to chemotherapy-Decadron prophylaxis added following cycle  7 FOLFOX (he did not take)      Disposition: Mr. Trethewey has completed 10 cycles of FOLFOX.  He has mild oxaliplatin neuropathy symptoms.  We will hold oxaliplatin with chemotherapy today.  He will complete a cycle of 5-fluorouracil.  Mr. Vanwagner will return for an office  visit and the final planned cycle of chemotherapy in 2 weeks.  He will be scheduled for restaging CTs in November.  Betsy Coder, MD  01/31/2021  9:51 AM

## 2021-01-31 NOTE — Progress Notes (Signed)
Patient presents for treatment. RN assessment completed along with the following:  Treatment Conditions (labs/vitals/weight) reviewed - Yes, and within treatment parameters.   Oncology Treatment Attestation completed for current therapy- Yes, on date 08/11/2020 Informed consent completed and reflects current therapy/intent - Yes, on date 08/30/2020 Provider progress note reviewed - Yes, today's provider note was reviewed. Treatment/Antibody/Supportive plan reviewed - Blank single:[id number]::"Yes, and Patient getting just 5FU pump today, Oxaliplatin on hold per MD's note of 01/31/2021. S&H and other orders reviewed - Yes, and there are no additional orders identified. Previous treatment date reviewed - Yes, and the appropriate amount of time has elapsed between treatments. Clinic Hand Off Received from - Yes from [facility]  Patient to proceed with treatment.

## 2021-02-02 ENCOUNTER — Other Ambulatory Visit: Payer: Self-pay

## 2021-02-02 ENCOUNTER — Inpatient Hospital Stay: Payer: 59

## 2021-02-02 VITALS — BP 129/85 | HR 85 | Temp 99.3°F | Resp 20

## 2021-02-02 DIAGNOSIS — Z5111 Encounter for antineoplastic chemotherapy: Secondary | ICD-10-CM | POA: Diagnosis not present

## 2021-02-02 DIAGNOSIS — C185 Malignant neoplasm of splenic flexure: Secondary | ICD-10-CM

## 2021-02-02 MED ORDER — SODIUM CHLORIDE 0.9% FLUSH
10.0000 mL | INTRAVENOUS | Status: DC | PRN
Start: 2021-02-02 — End: 2021-02-02
  Administered 2021-02-02: 10 mL

## 2021-02-02 MED ORDER — HEPARIN SOD (PORK) LOCK FLUSH 100 UNIT/ML IV SOLN
500.0000 [IU] | Freq: Once | INTRAVENOUS | Status: AC | PRN
  Administered 2021-02-02: 500 [IU]

## 2021-02-03 ENCOUNTER — Encounter: Payer: Self-pay | Admitting: *Deleted

## 2021-02-03 NOTE — Progress Notes (Signed)
Faxed office note/lab results from 8/29 to 02/03/21 to [employer] #[phone]. Claim #[id number]

## 2021-02-11 ENCOUNTER — Other Ambulatory Visit: Payer: Self-pay | Admitting: Oncology

## 2021-02-15 ENCOUNTER — Inpatient Hospital Stay: Payer: 59

## 2021-02-15 ENCOUNTER — Inpatient Hospital Stay (HOSPITAL_BASED_OUTPATIENT_CLINIC_OR_DEPARTMENT_OTHER): Payer: 59 | Admitting: Oncology

## 2021-02-15 ENCOUNTER — Other Ambulatory Visit: Payer: Self-pay

## 2021-02-15 VITALS — BP 157/96 | HR 80 | Temp 98.1°F | Resp 20 | Ht 73.0 in | Wt 188.2 lb

## 2021-02-15 DIAGNOSIS — Z5111 Encounter for antineoplastic chemotherapy: Secondary | ICD-10-CM | POA: Diagnosis not present

## 2021-02-15 DIAGNOSIS — C185 Malignant neoplasm of splenic flexure: Secondary | ICD-10-CM | POA: Diagnosis not present

## 2021-02-15 LAB — CBC WITH DIFFERENTIAL (CANCER CENTER ONLY)
Abs Immature Granulocytes: 0.02 10*3/uL (ref 0.00–0.07)
Basophils Absolute: 0 10*3/uL (ref 0.0–0.1)
Basophils Relative: 1 %
Eosinophils Absolute: 0.1 10*3/uL (ref 0.0–0.5)
Eosinophils Relative: 1 %
HCT: 29.8 % — ABNORMAL LOW (ref 39.0–52.0)
Hemoglobin: 10 g/dL — ABNORMAL LOW (ref 13.0–17.0)
Immature Granulocytes: 0 %
Lymphocytes Relative: 21 %
Lymphs Abs: 1.3 10*3/uL (ref 0.7–4.0)
MCH: 32.3 pg (ref 26.0–34.0)
MCHC: 33.6 g/dL (ref 30.0–36.0)
MCV: 96.1 fL (ref 80.0–100.0)
Monocytes Absolute: 0.8 10*3/uL (ref 0.1–1.0)
Monocytes Relative: 14 %
Neutro Abs: 3.8 10*3/uL (ref 1.7–7.7)
Neutrophils Relative %: 63 %
Platelet Count: 185 10*3/uL (ref 150–400)
RBC: 3.1 MIL/uL — ABNORMAL LOW (ref 4.22–5.81)
RDW: 13.9 % (ref 11.5–15.5)
WBC Count: 6.1 10*3/uL (ref 4.0–10.5)
nRBC: 0 % (ref 0.0–0.2)

## 2021-02-15 LAB — CMP (CANCER CENTER ONLY)
ALT: 9 U/L (ref 0–44)
AST: 13 U/L — ABNORMAL LOW (ref 15–41)
Albumin: 3.3 g/dL — ABNORMAL LOW (ref 3.5–5.0)
Alkaline Phosphatase: 136 U/L — ABNORMAL HIGH (ref 38–126)
Anion gap: 9 (ref 5–15)
BUN: 20 mg/dL (ref 6–20)
CO2: 27 mmol/L (ref 22–32)
Calcium: 9.1 mg/dL (ref 8.9–10.3)
Chloride: 101 mmol/L (ref 98–111)
Creatinine: 1.82 mg/dL — ABNORMAL HIGH (ref 0.61–1.24)
GFR, Estimated: 43 mL/min — ABNORMAL LOW (ref >60)
Glucose, Bld: 412 mg/dL — ABNORMAL HIGH (ref 70–99)
Potassium: 3.6 mmol/L (ref 3.5–5.1)
Sodium: 137 mmol/L (ref 135–145)
Total Bilirubin: 0.5 mg/dL (ref 0.3–1.2)
Total Protein: 6.3 g/dL — ABNORMAL LOW (ref 6.5–8.1)

## 2021-02-15 LAB — CEA (ACCESS): CEA (CHCC): 4.01 ng/mL (ref 0.00–5.00)

## 2021-02-15 MED ORDER — SODIUM CHLORIDE 0.9 % IV SOLN
2400.0000 mg/m2 | INTRAVENOUS | Status: DC
  Administered 2021-02-15: 5000 mg via INTRAVENOUS
  Filled 2021-02-15: qty 100

## 2021-02-15 NOTE — Patient Instructions (Signed)
Jeffrey Young   Discharge Instructions: Thank you for choosing [facility] to provide your oncology and hematology care.   If you have a lab appointment with the [facility], please go directly to the [facility] and check in at the registration area.   Wear comfortable clothing and clothing appropriate for easy access to any Portacath or PICC line.   We strive to give you quality time with your provider. You may need to reschedule your appointment if you arrive late (15 or more minutes).  Arriving late affects you and other patients whose appointments are after yours.  Also, if you miss three or more appointments without notifying the office, you may be dismissed from the clinic at the provider's discretion.      For prescription refill requests, have your pharmacy contact our office and allow 72 hours for refills to be completed.    Today you received the following chemotherapy and/or immunotherapy agents Flourouracil (ADRUCIL).      To help prevent nausea and vomiting after your treatment, we encourage you to take your nausea medication as directed.  BELOW ARE SYMPTOMS THAT SHOULD BE REPORTED IMMEDIATELY: *FEVER GREATER THAN 100.4 F (38 C) OR HIGHER *CHILLS OR SWEATING *NAUSEA AND VOMITING THAT IS NOT CONTROLLED WITH YOUR NAUSEA MEDICATION *UNUSUAL SHORTNESS OF BREATH *UNUSUAL BRUISING OR BLEEDING *URINARY PROBLEMS (pain or burning when urinating, or frequent urination) *BOWEL PROBLEMS (unusual diarrhea, constipation, pain near the anus) TENDERNESS IN MOUTH AND THROAT WITH OR WITHOUT PRESENCE OF ULCERS (sore throat, sores in mouth, or a toothache) UNUSUAL RASH, SWELLING OR PAIN  UNUSUAL VAGINAL DISCHARGE OR ITCHING   Items with * indicate a potential emergency and should be followed up as soon as possible or go to the Emergency Department if any problems should occur.  Please show the CHEMOTHERAPY ALERT CARD or IMMUNOTHERAPY ALERT CARD at  check-in to the Emergency Department and triage nurse.  Should you have questions after your visit or need to cancel or reschedule your appointment, please contact [facility]  Dept: [phone]  and follow the prompts.  Office hours are 8:00 a.m. to 4:30 p.m. Monday - Friday. Please note that voicemails left after 4:00 p.m. may not be returned until the following business day.  We are closed weekends and major holidays. You have access to a nurse at all times for urgent questions. Please call the main number to the clinic Dept: [phone] and follow the prompts.   For any non-urgent questions, you may also contact your provider using MyChart. We now offer e-Visits for anyone 62 and older to request care online for non-urgent symptoms. For details visit [email or web address].   Also download the MyChart app! Go to the app store, search "MyChart", open the app, select [facility], and log in with your MyChart username and password.  Due to Covid, a mask is required upon entering the hospital/clinic. If you do not have a mask, one will be given to you upon arrival. For doctor visits, patients may have 1 support person aged 65 or older with them. For treatment visits, patients cannot have anyone with them due to current Covid guidelines and our immunocompromised population.   Fluorouracil, 5-FU injection What is this medication? FLUOROURACIL, 5-FU (flure oh YOOR a sil) is a chemotherapy drug. It slows the growth of cancer cells. This medicine is used to treat many types of cancer like breast cancer, colon or rectal cancer, pancreatic cancer, and stomach cancer.  This medicine may be used for other purposes; ask your health care provider or pharmacist if you have questions. COMMON BRAND NAME(S): Adrucil What should I tell my care team before I take this medication? They need to know if you have any of these conditions: blood disorders dihydropyrimidine dehydrogenase  (DPD) deficiency infection (especially a virus infection such as chickenpox, cold sores, or herpes) kidney disease liver disease malnourished, poor nutrition recent or ongoing radiation therapy an unusual or allergic reaction to fluorouracil, other chemotherapy, other medicines, foods, dyes, or preservatives pregnant or trying to get pregnant breast-feeding How should I use this medication? This drug is given as an infusion or injection into a vein. It is administered in a hospital or clinic by a specially trained health care professional. Talk to your pediatrician regarding the use of this medicine in children. Special care may be needed. Overdosage: If you think you have taken too much of this medicine contact a poison control center or emergency room at once. NOTE: This medicine is only for you. Do not share this medicine with others. What if I miss a dose? It is important not to miss your dose. Call your doctor or health care professional if you are unable to keep an appointment. What may interact with this medication? Do not take this medicine with any of the following medications: live virus vaccines This medicine may also interact with the following medications: medicines that treat or prevent blood clots like warfarin, enoxaparin, and dalteparin This list may not describe all possible interactions. Give your health care provider a list of all the medicines, herbs, non-prescription drugs, or dietary supplements you use. Also tell them if you smoke, drink alcohol, or use illegal drugs. Some items may interact with your medicine. What should I watch for while using this medication? Visit your doctor for checks on your progress. This drug may make you feel generally unwell. This is not uncommon, as chemotherapy can affect healthy cells as well as cancer cells. Report any side effects. Continue your course of treatment even though you feel ill unless your doctor tells you to stop. In some  cases, you may be given additional medicines to help with side effects. Follow all directions for their use. Call your doctor or health care professional for advice if you get a fever, chills or sore throat, or other symptoms of a cold or flu. Do not treat yourself. This drug decreases your body's ability to fight infections. Try to avoid being around people who are sick. This medicine may increase your risk to bruise or bleed. Call your doctor or health care professional if you notice any unusual bleeding. Be careful brushing and flossing your teeth or using a toothpick because you may get an infection or bleed more easily. If you have any dental work done, tell your dentist you are receiving this medicine. Avoid taking products that contain aspirin, acetaminophen, ibuprofen, naproxen, or ketoprofen unless instructed by your doctor. These medicines may hide a fever. Do not become pregnant while taking this medicine. Women should inform their doctor if they wish to become pregnant or think they might be pregnant. There is a potential for serious side effects to an unborn child. Talk to your health care professional or pharmacist for more information. Do not breast-feed an infant while taking this medicine. Men should inform their doctor if they wish to father a child. This medicine may lower sperm counts. Do not treat diarrhea with over the counter products. Contact your doctor if  you have diarrhea that lasts more than 2 days or if it is severe and watery. This medicine can make you more sensitive to the sun. Keep out of the sun. If you cannot avoid being in the sun, wear protective clothing and use sunscreen. Do not use sun lamps or tanning beds/booths. What side effects may I notice from receiving this medication? Side effects that you should report to your doctor or health care professional as soon as possible: allergic reactions like skin rash, itching or hives, swelling of the face, lips, or  tongue low blood counts - this medicine may decrease the number of white blood cells, red blood cells and platelets. You may be at increased risk for infections and bleeding. signs of infection - fever or chills, cough, sore throat, pain or difficulty passing urine signs of decreased platelets or bleeding - bruising, pinpoint red spots on the skin, black, tarry stools, blood in the urine signs of decreased red blood cells - unusually weak or tired, fainting spells, lightheadedness breathing problems changes in vision chest pain mouth sores nausea and vomiting pain, swelling, redness at site where injected pain, tingling, numbness in the hands or feet redness, swelling, or sores on hands or feet stomach pain unusual bleeding Side effects that usually do not require medical attention (report to your doctor or health care professional if they continue or are bothersome): changes in finger or toe nails diarrhea dry or itchy skin hair loss headache loss of appetite sensitivity of eyes to the light stomach upset unusually teary eyes This list may not describe all possible side effects. Call your doctor for medical advice about side effects. You may report side effects to FDA at 1-800-FDA-1088. Where should I keep my medication? This drug is given in a hospital or clinic and will not be stored at home. NOTE: This sheet is a summary. It may not cover all possible information. If you have questions about this medicine, talk to your doctor, pharmacist, or health care provider.  2022 Elsevier/Gold Standard (2019-04-08 15:00:03)  The chemotherapy medication bag should finish at 46 hours, 96 hours, or 7 days. For example, if your pump is scheduled for 46 hours and it was put on at 4:00 p.m., it should finish at 2:00 p.m. the day it is scheduled to come off regardless of your appointment time.     Estimated time to finish at 09:30 a.m. on Thursday 02/17/2021.   If the display on your pump reads  "Low Volume" and it is beeping, take the batteries out of the pump and come to the cancer center for it to be taken off.   If the pump alarms go off prior to the pump reading "Low Volume" then call [phone] and someone can assist you.  If the plunger comes out and the chemotherapy medication is leaking out, please use your home chemo spill kit to clean up the spill. Do NOT use paper towels or other household products.  If you have problems or questions regarding your pump, please call either [phone] (24 hours a day) or the cancer center Monday-Friday 8:00 a.m.- 4:30 p.m. at the clinic number and we will assist you. If you are unable to get assistance, then go to the nearest Emergency Department and ask the staff to contact the IV team for assistance.

## 2021-02-15 NOTE — Progress Notes (Signed)
Patient presents for treatment. RN assessment completed along with the following:  Labs/vitals reviewed - Yes, and Scr. 1.82, okay to proceed per Dr. Benay Spice.    Weight within 10% of previous measurement - Yes previous 187 lbs. Today 188 lbs. Oncology Treatment Attestation completed for current therapy- Yes, on date 08/11/2020 Informed consent completed and reflects current therapy/intent - Yes, on date 08/30/2020             Provider progress note reviewed - Yes, today's provider note was reviewed. Treatment/Antibody/Supportive plan reviewed - Yes, and there are no adjustments needed for today's treatment. S&H and other orders reviewed - Yes, and there are no additional orders identified. Previous treatment date reviewed - Yes, and the appropriate amount of time has elapsed between treatments. Clinic Hand Off Received from - No  Patient to proceed with treatment.

## 2021-02-15 NOTE — Progress Notes (Signed)
[facility] OFFICE PROGRESS NOTE   Diagnosis: Colon cancer  INTERVAL HISTORY:   Jeffrey Young completed a cycle of 5-fluorouracil on 01/31/2021.  No mouth sores, nausea, or diarrhea.  He continues to have numbness in the hands.  No other complaint.  Objective:  Vital signs in last 24 hours:  Blood pressure (!) 157/96, pulse 80, temperature 98.1 F (36.7 C), temperature source Oral, resp. rate 20, height $RemoveBe'6\' 1"'OPSrqLWOF$  (1.854 m), weight 188 lb 3.2 oz (85.4 kg), SpO2 100 %.    HEENT: White coated tongue, no buccal thrush or ulcers Resp: Lungs clear bilaterally Cardio: Regular rate and rhythm GI: No hepatosplenomegaly Vascular: No leg edema Neuro: Mild to moderate loss of vibratory sense at the fingertips bilaterally, right greater than left Skin: Palms without erythema  Portacath/PICC-without erythema  Lab Results:  Lab Results  Component Value Date   WBC 6.1 02/15/2021   HGB 10.0 (L) 02/15/2021   HCT 29.8 (L) 02/15/2021   MCV 96.1 02/15/2021   PLT 185 02/15/2021   NEUTROABS 3.8 02/15/2021    CMP  Lab Results  Component Value Date   NA 141 01/31/2021   K 3.4 (L) 01/31/2021   CL 104 01/31/2021   CO2 27 01/31/2021   GLUCOSE 135 (H) 01/31/2021   BUN 15 01/31/2021   CREATININE 1.34 (H) 01/31/2021   CALCIUM 8.7 (L) 01/31/2021   PROT 6.3 (L) 01/31/2021   ALBUMIN 3.3 (L) 01/31/2021   AST 19 01/31/2021   ALT 10 01/31/2021   ALKPHOS 179 (H) 01/31/2021   BILITOT 0.4 01/31/2021   GFRNONAA >60 01/31/2021   GFRAA >60 01/26/2018    Lab Results  Component Value Date   CEA1 11.44 (H) 09/27/2020   CEA 6.17 (H) 01/31/2021     Medications: I have reviewed the patient's current medications.   Assessment/Plan: Descending colon cancer, stage IIIc (T3N2b M0), status post a partial left colectomy 07/30/2020, 9/16 lymph nodes positive, lymphovascular invasion, 1 satellite nodule, negative margins, MSS, no loss of mismatch repair protein expression -History of large  polyp in the left side of the colon-referred to [facility] in 05/2018 for procedure canceled secondary to COVID-19 pandemic.  Procedure was not rescheduled. -CT chest/abdomen/pelvis with contrast 07/27/2020-3 small pulmonary nodules less than 5 mm favored to be benign, circumferential luminal narrowing of the distal transverse colon concerning for malignancy, no metastatic adenopathy in the mesentery porta hepatis, no for metastasis. -CEA on 07/27/2020 was 17.3; 37 on 08/30/2020; 33 on 09/13/2020 -Colonoscopy performed 07/27/2020 showed a fungating, infiltrative and ulcerated nonobstructing large mass in the proximal descending colon.  Biopsy-adenocarcinoma -Cycle 1 FOLFOX 08/30/2020 -Cycle 2 FOLFOX 09/13/2020, Emend added for delayed nausea -Cycle 3 FOLFOX 09/27/2020 -Cycle 4 FOLFOX 10/11/2020 -Cycle 5 FOLFOX 11/08/2020 -Cycle 6 FOLFOX 11/23/2020 -CT 12/03/2020-prior 3 mm left apical nodule no longer seen, no new/suspicious pulmonary nodules, no evidence of metastatic disease -Cycle 7 FOLFOX 12/06/2020 -Cycle 8 FOLFOX 12/21/2020 -Cycle 9 FOLFOX 01/03/2021 -Cycle 10 FOLFOX 01/17/2021 -Cycle 11 FOLFOX 01/31/2021, oxaliplatin held secondary to neuropathy symptoms -Cycle 12 FOLFOX 02/15/2021, oxaliplatin held secondary to neuropathy 2.  Anemia due to GI bleeding, iron deficiency?,  Renal insufficiency? 3.  New onset acute diastolic CHF March 6384 4.  Diabetes mellitus 5.  Renal insufficiency 6.  Hypertension 7.  History of left transmetatarsal amputation 8.  History of colon polyps 9.  Neuropathy 10.  Delayed nausea secondary to chemotherapy-Decadron prophylaxis added following cycle 7 FOLFOX (he did not take)       Disposition: [name] appears stable.  He completed another cycle of 5-fluorouracil today.  This will be the final planned cycle of chemotherapy.  He will undergo restaging CTs and return for an office visit in November.  He will remain out of work for the next several months secondary to neuropathy  symptoms.  Betsy Coder, MD  02/15/2021  9:48 AM

## 2021-02-16 ENCOUNTER — Encounter: Payer: Self-pay | Admitting: *Deleted

## 2021-02-16 NOTE — Progress Notes (Signed)
Faxed completed STD forms to [employer] and copy to HIM for scanning. Will provide patient copy when he comes on 9/29 for pump d/c.

## 2021-02-17 ENCOUNTER — Inpatient Hospital Stay: Payer: 59

## 2021-02-17 VITALS — BP 156/93 | HR 79 | Temp 98.7°F | Resp 20

## 2021-02-17 DIAGNOSIS — C185 Malignant neoplasm of splenic flexure: Secondary | ICD-10-CM

## 2021-02-17 DIAGNOSIS — Z5111 Encounter for antineoplastic chemotherapy: Secondary | ICD-10-CM | POA: Diagnosis not present

## 2021-02-17 MED ORDER — HEPARIN SOD (PORK) LOCK FLUSH 100 UNIT/ML IV SOLN
500.0000 [IU] | Freq: Once | INTRAVENOUS | Status: AC | PRN
  Administered 2021-02-17: 500 [IU]

## 2021-02-17 MED ORDER — SODIUM CHLORIDE 0.9% FLUSH
10.0000 mL | INTRAVENOUS | Status: DC | PRN
  Administered 2021-02-17: 10 mL

## 2021-02-17 NOTE — Patient Instructions (Signed)
[facility]  Discharge Instructions: Thank you for choosing [facility] to provide your oncology and hematology care.   If you have a lab appointment with the [facility], please go directly to the [facility] and check in at the registration area.   Wear comfortable clothing and clothing appropriate for easy access to any Portacath or PICC line.   We strive to give you quality time with your provider. You may need to reschedule your appointment if you arrive late (15 or more minutes).  Arriving late affects you and other patients whose appointments are after yours.  Also, if you miss three or more appointments without notifying the office, you may be dismissed from the clinic at the provider's discretion.      For prescription refill requests, have your pharmacy contact our office and allow 72 hours for refills to be completed.    Today you received the following chemotherapy and/or immunotherapy agents: 78fu   To help prevent nausea and vomiting after your treatment, we encourage you to take your nausea medication as directed.  BELOW ARE SYMPTOMS THAT SHOULD BE REPORTED IMMEDIATELY: *FEVER GREATER THAN 100.4 F (38 C) OR HIGHER *CHILLS OR SWEATING *NAUSEA AND VOMITING THAT IS NOT CONTROLLED WITH YOUR NAUSEA MEDICATION *UNUSUAL SHORTNESS OF BREATH *UNUSUAL BRUISING OR BLEEDING *URINARY PROBLEMS (pain or burning when urinating, or frequent urination) *BOWEL PROBLEMS (unusual diarrhea, constipation, pain near the anus) TENDERNESS IN MOUTH AND THROAT WITH OR WITHOUT PRESENCE OF ULCERS (sore throat, sores in mouth, or a toothache) UNUSUAL RASH, SWELLING OR PAIN  UNUSUAL VAGINAL DISCHARGE OR ITCHING   Items with * indicate a potential emergency and should be followed up as soon as possible or go to the Emergency Department if any problems should occur.  Please show the CHEMOTHERAPY ALERT CARD or IMMUNOTHERAPY ALERT CARD at check-in to the  Emergency Department and triage nurse.  Should you have questions after your visit or need to cancel or reschedule your appointment, please contact [facility]  Dept: [phone]  and follow the prompts.  Office hours are 8:00 a.m. to 4:30 p.m. Monday - Friday. Please note that voicemails left after 4:00 p.m. may not be returned until the following business day.  We are closed weekends and major holidays. You have access to a nurse at all times for urgent questions. Please call the main number to the clinic Dept: [phone] and follow the prompts.   For any non-urgent questions, you may also contact your provider using MyChart. We now offer e-Visits for anyone 57 and older to request care online for non-urgent symptoms. For details visit [email or web address].   Also download the MyChart app! Go to the app store, search "MyChart", open the app, select [facility], and log in with your MyChart username and password.  Due to Covid, a mask is required upon entering the hospital/clinic. If you do not have a mask, one will be given to you upon arrival. For doctor visits, patients may have 1 support person aged 42 or older with them. For treatment visits, patients cannot have anyone with them due to current Covid guidelines and our immunocompromised population.

## 2021-02-28 ENCOUNTER — Inpatient Hospital Stay: Payer: 59

## 2021-02-28 ENCOUNTER — Other Ambulatory Visit: Payer: Self-pay

## 2021-02-28 ENCOUNTER — Inpatient Hospital Stay: Payer: 59 | Attending: Oncology

## 2021-02-28 ENCOUNTER — Telehealth: Payer: Self-pay

## 2021-02-28 VITALS — BP 146/84 | HR 87 | Temp 98.2°F | Resp 18

## 2021-02-28 DIAGNOSIS — C185 Malignant neoplasm of splenic flexure: Secondary | ICD-10-CM

## 2021-02-28 DIAGNOSIS — C186 Malignant neoplasm of descending colon: Secondary | ICD-10-CM | POA: Insufficient documentation

## 2021-02-28 DIAGNOSIS — Z452 Encounter for adjustment and management of vascular access device: Secondary | ICD-10-CM | POA: Insufficient documentation

## 2021-02-28 DIAGNOSIS — Z95828 Presence of other vascular implants and grafts: Secondary | ICD-10-CM

## 2021-02-28 LAB — CMP (CANCER CENTER ONLY)
ALT: 8 U/L (ref 0–44)
AST: 12 U/L — ABNORMAL LOW (ref 15–41)
Albumin: 3.4 g/dL — ABNORMAL LOW (ref 3.5–5.0)
Alkaline Phosphatase: 139 U/L — ABNORMAL HIGH (ref 38–126)
Anion gap: 10 (ref 5–15)
BUN: 19 mg/dL (ref 6–20)
CO2: 28 mmol/L (ref 22–32)
Calcium: 8.3 mg/dL — ABNORMAL LOW (ref 8.9–10.3)
Chloride: 96 mmol/L — ABNORMAL LOW (ref 98–111)
Creatinine: 1.72 mg/dL — ABNORMAL HIGH (ref 0.61–1.24)
GFR, Estimated: 46 mL/min — ABNORMAL LOW (ref >60)
Glucose, Bld: 455 mg/dL — ABNORMAL HIGH (ref 70–99)
Potassium: 3.4 mmol/L — ABNORMAL LOW (ref 3.5–5.1)
Sodium: 134 mmol/L — ABNORMAL LOW (ref 135–145)
Total Bilirubin: 0.5 mg/dL (ref 0.3–1.2)
Total Protein: 6.6 g/dL (ref 6.5–8.1)

## 2021-02-28 LAB — CBC WITH DIFFERENTIAL (CANCER CENTER ONLY)
Abs Immature Granulocytes: 0.02 10*3/uL (ref 0.00–0.07)
Basophils Absolute: 0 10*3/uL (ref 0.0–0.1)
Basophils Relative: 1 %
Eosinophils Absolute: 0.1 10*3/uL (ref 0.0–0.5)
Eosinophils Relative: 2 %
HCT: 32.9 % — ABNORMAL LOW (ref 39.0–52.0)
Hemoglobin: 11.1 g/dL — ABNORMAL LOW (ref 13.0–17.0)
Immature Granulocytes: 0 %
Lymphocytes Relative: 24 %
Lymphs Abs: 1.6 10*3/uL (ref 0.7–4.0)
MCH: 32 pg (ref 26.0–34.0)
MCHC: 33.7 g/dL (ref 30.0–36.0)
MCV: 94.8 fL (ref 80.0–100.0)
Monocytes Absolute: 0.6 10*3/uL (ref 0.1–1.0)
Monocytes Relative: 8 %
Neutro Abs: 4.4 10*3/uL (ref 1.7–7.7)
Neutrophils Relative %: 65 %
Platelet Count: 205 10*3/uL (ref 150–400)
RBC: 3.47 MIL/uL — ABNORMAL LOW (ref 4.22–5.81)
RDW: 13.6 % (ref 11.5–15.5)
WBC Count: 6.8 10*3/uL (ref 4.0–10.5)
nRBC: 0 % (ref 0.0–0.2)

## 2021-02-28 LAB — CEA (ACCESS): CEA (CHCC): 4.5 ng/mL (ref 0.00–5.00)

## 2021-02-28 MED ORDER — SODIUM CHLORIDE 0.9% FLUSH
10.0000 mL | Freq: Once | INTRAVENOUS | Status: AC
  Administered 2021-02-28: 10 mL via INTRAVENOUS

## 2021-02-28 MED ORDER — HEPARIN SOD (PORK) LOCK FLUSH 100 UNIT/ML IV SOLN
500.0000 [IU] | Freq: Once | INTRAVENOUS | Status: AC
  Administered 2021-02-28: 500 [IU] via INTRAVENOUS

## 2021-02-28 NOTE — Telephone Encounter (Signed)
-----   Message from Ladell Pier, MD sent at 02/28/2021  3:00 PM EDT ----- Please call patient, creatinine remains elevated, needs repeat BMP 1-2 days prior to CT

## 2021-02-28 NOTE — Patient Instructions (Signed)
Implanted Port Home Guide An implanted port is a device that is placed under the skin. It is usually placed in the chest. The device can be used to give IV medicine, to take blood, or for dialysis. You may have an implanted port if: You need IV medicine that would be irritating to the small veins in your hands or arms. You need IV medicines, such as antibiotics, for a long period of time. You need IV nutrition for a long period of time. You need dialysis. When you have a port, your health care provider can choose to use the port instead of veins in your arms for these procedures. You may have fewer limitations when using a port than you would if you used other types of long-term IVs, and you will likely be able to return to normal activities after your incision heals. An implanted port has two main parts: Reservoir. The reservoir is the part where a needle is inserted to give medicines or draw blood. The reservoir is round. After it is placed, it appears as a small, raised area under your skin. Catheter. The catheter is a thin, flexible tube that connects the reservoir to a vein. Medicine that is inserted into the reservoir goes into the catheter and then into the vein. How is my port accessed? To access your port: A numbing cream may be placed on the skin over the port site. Your health care provider will put on a mask and sterile gloves. The skin over your port will be cleaned carefully with a germ-killing soap and allowed to dry. Your health care provider will gently pinch the port and insert a needle into it. Your health care provider will check for a blood return to make sure the port is in the vein and is not clogged. If your port needs to remain accessed to get medicine continuously (constant infusion), your health care provider will place a clear bandage (dressing) over the needle site. The dressing and needle will need to be changed every week, or as told by your health care provider. What  is flushing? Flushing helps keep the port from getting clogged. Follow instructions from your health care provider about how and when to flush the port. Ports are usually flushed with saline solution or a medicine called heparin. The need for flushing will depend on how the port is used: If the port is only used from time to time to give medicines or draw blood, the port may need to be flushed: Before and after medicines have been given. Before and after blood has been drawn. As part of routine maintenance. Flushing may be recommended every 4-6 weeks. If a constant infusion is running, the port may not need to be flushed. Throw away any syringes in a disposal container that is meant for sharp items (sharps container). You can buy a sharps container from a pharmacy, or you can make one by using an empty hard plastic bottle with a cover. How long will my port stay implanted? The port can stay in for as long as your health care provider thinks it is needed. When it is time for the port to come out, a surgery will be done to remove it. The surgery will be similar to the procedure that was done to put the port in. Follow these instructions at home:  Flush your port as told by your health care provider. If you need an infusion over several days, follow instructions from your health care provider about how   to take care of your port site. Make sure you: Wash your hands with soap and water before you change your dressing. If soap and water are not available, use alcohol-based hand sanitizer. Change your dressing as told by your health care provider. Place any used dressings or infusion bags into a plastic bag. Throw that bag in the trash. Keep the dressing that covers the needle clean and dry. Do not get it wet. Do not use scissors or sharp objects near the tube. Keep the tube clamped, unless it is being used. Check your port site every day for signs of infection. Check for: Redness, swelling, or  pain. Fluid or blood. Pus or a bad smell. Protect the skin around the port site. Avoid wearing bra straps that rub or irritate the site. Protect the skin around your port from seat belts. Place a soft pad over your chest if needed. Bathe or shower as told by your health care provider. The site may get wet as long as you are not actively receiving an infusion. Return to your normal activities as told by your health care provider. Ask your health care provider what activities are safe for you. Carry a medical alert card or wear a medical alert bracelet at all times. This will let health care providers know that you have an implanted port in case of an emergency. Get help right away if: You have redness, swelling, or pain at the port site. You have fluid or blood coming from your port site. You have pus or a bad smell coming from the port site. You have a fever. Summary Implanted ports are usually placed in the chest for long-term IV access. Follow instructions from your health care provider about flushing the port and changing bandages (dressings). Take care of the area around your port by avoiding clothing that puts pressure on the area, and by watching for signs of infection. Protect the skin around your port from seat belts. Place a soft pad over your chest if needed. Get help right away if you have a fever or you have redness, swelling, pain, drainage, or a bad smell at the port site. This information is not intended to replace advice given to you by your health care provider. Make sure you discuss any questions you have with your health care provider. Document Revised: 07/28/2020 Document Reviewed: 09/22/2019 Elsevier Patient Education  2022 [employer] Inc.  

## 2021-02-28 NOTE — Telephone Encounter (Signed)
Message relayed. Pt verbalized understanding.

## 2021-03-08 ENCOUNTER — Encounter: Payer: Self-pay | Admitting: *Deleted

## 2021-03-08 NOTE — Progress Notes (Signed)
ROI and letter from [facility] asking if patient has had chemotherapy during dates of 10/19/19 to 01/18/20. Returned fax with note that he was not diagnosed w/cancer until 07/27/2020. Fax [phone] Claim # [id number]

## 2021-03-31 ENCOUNTER — Other Ambulatory Visit: Payer: Self-pay

## 2021-03-31 DIAGNOSIS — Z95828 Presence of other vascular implants and grafts: Secondary | ICD-10-CM

## 2021-03-31 DIAGNOSIS — C185 Malignant neoplasm of splenic flexure: Secondary | ICD-10-CM

## 2021-03-31 NOTE — Progress Notes (Signed)
Labs entered for 04/04/21 per phlebotomy

## 2021-04-01 ENCOUNTER — Emergency Department (HOSPITAL_COMMUNITY): Payer: 59

## 2021-04-01 ENCOUNTER — Encounter (HOSPITAL_COMMUNITY): Payer: Self-pay

## 2021-04-01 ENCOUNTER — Other Ambulatory Visit: Payer: Self-pay

## 2021-04-01 ENCOUNTER — Inpatient Hospital Stay (HOSPITAL_COMMUNITY)
Admission: EM | Admit: 2021-04-01 | Discharge: 2021-04-15 | DRG: 871 | Disposition: A | Discharge status: Home Health | Payer: 59 | Attending: Internal Medicine | Admitting: Internal Medicine

## 2021-04-01 DIAGNOSIS — G9340 Encephalopathy, unspecified: Secondary | ICD-10-CM

## 2021-04-01 DIAGNOSIS — T451X5A Adverse effect of antineoplastic and immunosuppressive drugs, initial encounter: Secondary | ICD-10-CM | POA: Diagnosis present

## 2021-04-01 DIAGNOSIS — G620 Drug-induced polyneuropathy: Secondary | ICD-10-CM | POA: Diagnosis present

## 2021-04-01 DIAGNOSIS — K831 Obstruction of bile duct: Secondary | ICD-10-CM | POA: Diagnosis present

## 2021-04-01 DIAGNOSIS — K922 Gastrointestinal hemorrhage, unspecified: Secondary | ICD-10-CM | POA: Diagnosis not present

## 2021-04-01 DIAGNOSIS — Z79899 Other long term (current) drug therapy: Secondary | ICD-10-CM

## 2021-04-01 DIAGNOSIS — G9341 Metabolic encephalopathy: Secondary | ICD-10-CM

## 2021-04-01 DIAGNOSIS — K2100 Gastro-esophageal reflux disease with esophagitis, without bleeding: Secondary | ICD-10-CM | POA: Diagnosis present

## 2021-04-01 DIAGNOSIS — I5042 Chronic combined systolic (congestive) and diastolic (congestive) heart failure: Secondary | ICD-10-CM | POA: Diagnosis present

## 2021-04-01 DIAGNOSIS — K3184 Gastroparesis: Secondary | ICD-10-CM | POA: Diagnosis present

## 2021-04-01 DIAGNOSIS — R112 Nausea with vomiting, unspecified: Secondary | ICD-10-CM

## 2021-04-01 DIAGNOSIS — E1122 Type 2 diabetes mellitus with diabetic chronic kidney disease: Secondary | ICD-10-CM | POA: Diagnosis present

## 2021-04-01 DIAGNOSIS — R1084 Generalized abdominal pain: Secondary | ICD-10-CM

## 2021-04-01 DIAGNOSIS — E1169 Type 2 diabetes mellitus with other specified complication: Secondary | ICD-10-CM | POA: Diagnosis present

## 2021-04-01 DIAGNOSIS — R140 Abdominal distension (gaseous): Secondary | ICD-10-CM

## 2021-04-01 DIAGNOSIS — C189 Malignant neoplasm of colon, unspecified: Secondary | ICD-10-CM

## 2021-04-01 DIAGNOSIS — K2101 Gastro-esophageal reflux disease with esophagitis, with bleeding: Secondary | ICD-10-CM | POA: Diagnosis present

## 2021-04-01 DIAGNOSIS — A419 Sepsis, unspecified organism: Secondary | ICD-10-CM | POA: Diagnosis not present

## 2021-04-01 DIAGNOSIS — D638 Anemia in other chronic diseases classified elsewhere: Secondary | ICD-10-CM | POA: Diagnosis present

## 2021-04-01 DIAGNOSIS — R17 Unspecified jaundice: Secondary | ICD-10-CM

## 2021-04-01 DIAGNOSIS — Z20822 Contact with and (suspected) exposure to covid-19: Secondary | ICD-10-CM | POA: Diagnosis present

## 2021-04-01 DIAGNOSIS — R066 Hiccough: Secondary | ICD-10-CM | POA: Diagnosis present

## 2021-04-01 DIAGNOSIS — Z85038 Personal history of other malignant neoplasm of large intestine: Secondary | ICD-10-CM

## 2021-04-01 DIAGNOSIS — I130 Hypertensive heart and chronic kidney disease with heart failure and stage 1 through stage 4 chronic kidney disease, or unspecified chronic kidney disease: Secondary | ICD-10-CM | POA: Diagnosis present

## 2021-04-01 DIAGNOSIS — Z91040 Latex allergy status: Secondary | ICD-10-CM

## 2021-04-01 DIAGNOSIS — R1310 Dysphagia, unspecified: Secondary | ICD-10-CM | POA: Diagnosis present

## 2021-04-01 DIAGNOSIS — Z89432 Acquired absence of left foot: Secondary | ICD-10-CM

## 2021-04-01 DIAGNOSIS — K5909 Other constipation: Secondary | ICD-10-CM | POA: Diagnosis present

## 2021-04-01 DIAGNOSIS — Z91030 Bee allergy status: Secondary | ICD-10-CM

## 2021-04-01 DIAGNOSIS — E876 Hypokalemia: Secondary | ICD-10-CM

## 2021-04-01 DIAGNOSIS — Z794 Long term (current) use of insulin: Secondary | ICD-10-CM

## 2021-04-01 DIAGNOSIS — C185 Malignant neoplasm of splenic flexure: Secondary | ICD-10-CM | POA: Diagnosis present

## 2021-04-01 DIAGNOSIS — K449 Diaphragmatic hernia without obstruction or gangrene: Secondary | ICD-10-CM | POA: Diagnosis present

## 2021-04-01 DIAGNOSIS — Z8719 Personal history of other diseases of the digestive system: Secondary | ICD-10-CM

## 2021-04-01 DIAGNOSIS — I248 Other forms of acute ischemic heart disease: Secondary | ICD-10-CM | POA: Diagnosis present

## 2021-04-01 DIAGNOSIS — N179 Acute kidney failure, unspecified: Secondary | ICD-10-CM

## 2021-04-01 DIAGNOSIS — E785 Hyperlipidemia, unspecified: Secondary | ICD-10-CM | POA: Diagnosis present

## 2021-04-01 DIAGNOSIS — R6520 Severe sepsis without septic shock: Secondary | ICD-10-CM

## 2021-04-01 DIAGNOSIS — Z8249 Family history of ischemic heart disease and other diseases of the circulatory system: Secondary | ICD-10-CM

## 2021-04-01 DIAGNOSIS — E1165 Type 2 diabetes mellitus with hyperglycemia: Secondary | ICD-10-CM | POA: Diagnosis present

## 2021-04-01 DIAGNOSIS — R7989 Other specified abnormal findings of blood chemistry: Secondary | ICD-10-CM

## 2021-04-01 DIAGNOSIS — R339 Retention of urine, unspecified: Secondary | ICD-10-CM

## 2021-04-01 DIAGNOSIS — K920 Hematemesis: Secondary | ICD-10-CM

## 2021-04-01 DIAGNOSIS — N1831 Chronic kidney disease, stage 3a: Secondary | ICD-10-CM | POA: Diagnosis present

## 2021-04-01 DIAGNOSIS — I152 Hypertension secondary to endocrine disorders: Secondary | ICD-10-CM | POA: Diagnosis present

## 2021-04-01 DIAGNOSIS — E860 Dehydration: Secondary | ICD-10-CM | POA: Diagnosis present

## 2021-04-01 LAB — CBC WITH DIFFERENTIAL/PLATELET
Abs Immature Granulocytes: 0.1 10*3/uL — ABNORMAL HIGH (ref 0.00–0.07)
Basophils Absolute: 0 10*3/uL (ref 0.0–0.1)
Basophils Relative: 0 %
Eosinophils Absolute: 0 10*3/uL (ref 0.0–0.5)
Eosinophils Relative: 0 %
HCT: 37.6 % — ABNORMAL LOW (ref 39.0–52.0)
Hemoglobin: 13 g/dL (ref 13.0–17.0)
Immature Granulocytes: 1 %
Lymphocytes Relative: 7 %
Lymphs Abs: 1.1 10*3/uL (ref 0.7–4.0)
MCH: 31.5 pg (ref 26.0–34.0)
MCHC: 34.6 g/dL (ref 30.0–36.0)
MCV: 91 fL (ref 80.0–100.0)
Monocytes Absolute: 1.5 10*3/uL — ABNORMAL HIGH (ref 0.1–1.0)
Monocytes Relative: 10 %
Neutro Abs: 12.7 10*3/uL — ABNORMAL HIGH (ref 1.7–7.7)
Neutrophils Relative %: 82 %
Platelets: 255 10*3/uL (ref 150–400)
RBC: 4.13 MIL/uL — ABNORMAL LOW (ref 4.22–5.81)
RDW: 12.6 % (ref 11.5–15.5)
WBC: 15.4 10*3/uL — ABNORMAL HIGH (ref 4.0–10.5)
nRBC: 0 % (ref 0.0–0.2)

## 2021-04-01 LAB — COMPREHENSIVE METABOLIC PANEL
ALT: 10 U/L (ref 0–44)
AST: 19 U/L (ref 15–41)
Albumin: 3.4 g/dL — ABNORMAL LOW (ref 3.5–5.0)
Alkaline Phosphatase: 169 U/L — ABNORMAL HIGH (ref 38–126)
Anion gap: 11 (ref 5–15)
BUN: 26 mg/dL — ABNORMAL HIGH (ref 6–20)
CO2: 28 mmol/L (ref 22–32)
Calcium: 9.4 mg/dL (ref 8.9–10.3)
Chloride: 94 mmol/L — ABNORMAL LOW (ref 98–111)
Creatinine, Ser: 2.18 mg/dL — ABNORMAL HIGH (ref 0.61–1.24)
GFR, Estimated: 34 mL/min — ABNORMAL LOW (ref >60)
Glucose, Bld: 324 mg/dL — ABNORMAL HIGH (ref 70–99)
Potassium: 3.4 mmol/L — ABNORMAL LOW (ref 3.5–5.1)
Sodium: 133 mmol/L — ABNORMAL LOW (ref 135–145)
Total Bilirubin: 1.7 mg/dL — ABNORMAL HIGH (ref 0.3–1.2)
Total Protein: 7.6 g/dL (ref 6.5–8.1)

## 2021-04-01 LAB — OCCULT BLOOD GASTRIC / DUODENUM (SPECIMEN CUP)
Occult Blood, Gastric: POSITIVE — AB
pH, Gastric: 3

## 2021-04-01 LAB — TYPE AND SCREEN
ABO/RH(D): A POS
Antibody Screen: NEGATIVE

## 2021-04-01 LAB — RESP PANEL BY RT-PCR (FLU A&B, COVID) ARPGX2
Influenza A by PCR: NEGATIVE
Influenza B by PCR: NEGATIVE
SARS Coronavirus 2 by RT PCR: NEGATIVE

## 2021-04-01 LAB — PROTIME-INR
INR: 0.9 (ref 0.8–1.2)
Prothrombin Time: 12.4 seconds (ref 11.4–15.2)

## 2021-04-01 LAB — APTT: aPTT: 21 seconds — ABNORMAL LOW (ref 24–36)

## 2021-04-01 LAB — LACTIC ACID, PLASMA: Lactic Acid, Venous: 2.4 mmol/L (ref 0.5–1.9)

## 2021-04-01 LAB — AMMONIA: Ammonia: 6 umol/L — ABNORMAL LOW (ref 9–35)

## 2021-04-01 LAB — POC OCCULT BLOOD, ED: Fecal Occult Bld: NEGATIVE

## 2021-04-01 MED ORDER — SODIUM CHLORIDE 0.9 % IV SOLN
2.0000 g | Freq: Once | INTRAVENOUS | Status: AC
  Administered 2021-04-01: 2 g via INTRAVENOUS
  Filled 2021-04-01: qty 2

## 2021-04-01 MED ORDER — LACTATED RINGERS IV SOLN: INTRAVENOUS | Status: DC

## 2021-04-01 MED ORDER — LACTATED RINGERS IV BOLUS (SEPSIS)
1000.0000 mL | Freq: Once | INTRAVENOUS | Status: AC
  Administered 2021-04-01: 1000 mL via INTRAVENOUS

## 2021-04-01 MED ORDER — VANCOMYCIN HCL 2000 MG/400ML IV SOLN
2000.0000 mg | Freq: Once | INTRAVENOUS | Status: AC
  Administered 2021-04-01: 2000 mg via INTRAVENOUS
  Filled 2021-04-01: qty 400

## 2021-04-01 MED ORDER — METRONIDAZOLE 500 MG/100ML IV SOLN
500.0000 mg | Freq: Once | INTRAVENOUS | Status: AC
  Administered 2021-04-01: 500 mg via INTRAVENOUS
  Filled 2021-04-01: qty 100

## 2021-04-01 MED ORDER — VANCOMYCIN HCL IN DEXTROSE 1-5 GM/200ML-% IV SOLN
1000.0000 mg | Freq: Once | INTRAVENOUS | Status: DC
  Filled 2021-04-01: qty 200

## 2021-04-01 NOTE — Progress Notes (Signed)
A consult was received from an ED physician for Vancomycin & Cefepime per pharmacy dosing.  The patient's profile has been reviewed for ht/wt/allergies/indication/available labs.   A one time order has been placed for Cefepime 2gm & Vancomycin 2gm IV.  Further antibiotics/pharmacy consults should be ordered by admitting physician if indicated.                       Thank you,  Netta Cedars PharmD 04/01/2021  9:59 PM

## 2021-04-01 NOTE — ED Provider Notes (Addendum)
[facility] DEPT Provider Note   CSN: [id number] Arrival date & time: 04/01/21  2110     History Chief Complaint  Patient presents with   Abdominal Pain   Emesis    Jeffrey Young is a 55 y.o. male.  HPI     55 year old male with a history of colon cancer, diabetes, hypertension, hyperlipidemia, necrotizing fasciitis, CKD, who presents with concern for 3 days of abdominal pain, nausea, vomiting with confusion starting today.   Has not been urinating or having BM, reported he had not BM in 2 days, had not urinated since yesterday.  Not sure if he was trying to urinate and couldn't or if he didn't need to go  Vomiting more than 4 times day, 3 times yesterday. Has reported headache and abdominal pain Felt warm at home, had shaking chills  Dark matter in emesis,not sure if was blood  Today was the only day he was not acting himself, confused Denies medication changes, possibility of alcohol or benzodizepine withdrawal.   Past Medical History:  Diagnosis Date   Arthritis    Hip   Colon cancer ([place])    Diabetic mononeuropathy associated with type 2 diabetes mellitus ([place]) 03/21/2017   Diabetic retinopathy ([place]) 07/31/2020   Enthesopathy of ankle and tarsus 04/02/2009   Formatting of this note might be different from the original. 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 Type 2 Diabetes Mellitus - Uncomplicated, Uncontrolled    Patient Active Problem List   Diagnosis Date Noted   GI bleed 04/02/2021   Severe sepsis ([place]) 04/02/2021   AKI (acute kidney injury) ([place]) 32/04/2481   Acute metabolic encephalopathy 50/07/7046   Hypokalemia 04/02/2021   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 07/31/2020   IDA (iron deficiency anemia) from bleeding colon cancer 07/31/2020   Diabetic retinopathy ([place]) 07/31/2020   Hyperlipidemia 07/31/2020   Low back pain 07/31/2020   Shortness of breath 88/91/6945   Systolic heart failure ([place]) 07/31/2020   CKD (chronic kidney disease) stage 3, GFR 30-59 ml/min ([place]) 07/31/2020   Insulin-requiring or dependent type II diabetes mellitus ([place]) 07/31/2020   ARF (acute renal failure) ([place]) 07/25/2020   Symptomatic anemia    New onset of congestive heart failure ([place]) 07/24/2020   GIB (gastrointestinal bleeding) 07/24/2020   Hypertension associated with diabetes ([place]) 06/07/2019   Erectile dysfunction associated with type 2 diabetes mellitus ([place]) 05/08/2019   Sepsis ([place]) 01/22/2018   Diabetic ulcer of left foot ([name]) 01/22/2018   Status post transmetatarsal amputation of left foot ([place]) 01/22/2018   Diabetic mononeuropathy associated with type 2 diabetes mellitus ([place]) 03/21/2017   Microalbuminuria due to type 2 diabetes mellitus ([place]) 03/21/2017   Vitamin D deficiency 04/06/2009   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, with long-term current use of insulin 04/02/2009  Enthesopathy of ankle and tarsus 04/02/2009    Past Surgical History:  Procedure Laterality Date   AMPUTATION Left 01/22/2018   Procedure: TRANSMETATARSAL AMPUTATION;  Surgeon: Newt Minion, MD;  Location: [facility];  Service: Orthopedics;  Laterality: Left;toes   BIOPSY  07/27/2020    Procedure: BIOPSY;  Surgeon: Carol Ada, MD;  Location: [facility];  Service: Endoscopy;;   COLON RESECTION N/A 07/30/2020   Procedure: HAND ASSISTED LAPAROSCOPIC LEFT HEMI COLECTOMY;  Surgeon: Johnathan Hausen, MD;  Location: [facility];  Service: General;  Laterality: N/A;   COLONOSCOPY WITH PROPOFOL N/A 05/24/2018   Procedure: COLONOSCOPY WITH PROPOFOL;  Surgeon: Carol Ada, MD;  [facility];  Service: Endoscopy;  Laterality: N/A;   COLONOSCOPY WITH PROPOFOL N/A 07/27/2020   Procedure: COLONOSCOPY WITH PROPOFOL;  Surgeon: Carol Ada, MD;  [facility];  Service: Endoscopy;  Laterality: N/A;   POLYPECTOMY  05/24/2018   Procedure: POLYPECTOMY;  Surgeon: Carol Ada, MD;  [facility];  Service: Endoscopy;;   PORTACATH PLACEMENT Left 08/24/2020   Procedure: INSERTION PORT-A-CATH;  Surgeon: Johnathan Hausen, MD;  Location: [facility];  Service: General;  Laterality: Left;  75/rm1   SUBMUCOSAL TATTOO INJECTION  07/27/2020   Procedure: SUBMUCOSAL TATTOO INJECTION;  Surgeon: Carol Ada, MD;  [facility];  Service: Endoscopy;;       Family History  Problem Relation Age of Onset   Hypertension Father     Social History   Tobacco Use   Smoking status: Never   Smokeless tobacco: Never  Vaping Use   Vaping Use: Never used  Substance Use Topics   Alcohol use: Yes    Alcohol/week: 1.0 standard drink    Types: 1 Cans of beer per week    Comment: occasional   Drug use: Never    Home Medications Prior to Admission medications   Medication Sig Start Date End Date Taking? Authorizing Provider  atorvastatin (LIPITOR) 10 MG tablet Take 10 mg by mouth at bedtime. 05/08/19  Yes [provider]  carvedilol (COREG) 3.125 MG tablet TAKE 1 TABLET BY MOUTH 2 TIMES DAILY WITH A MEAL Patient taking differently: Take 3.125 mg by mouth 2 (two) times daily with a meal. 11/05/20  Yes Ladell Pier, MD  dexamethasone (DECADRON) 4 MG tablet Take 1 tablet (4  mg total) by mouth 2 (two) times daily. Take for 2 days after each chemotherapy treatment. Start on day 3 of each cycle 12/06/20  Yes Ladell Pier, MD  Insulin Glargine-Lixisenatide 100-33 UNT-MCG/ML SOPN Inject 10-15 Units into the skin See admin instructions. Inject 10 units into the skin in the morning and 15 units in the evening 06/10/20  Yes [provider]  KLOR-CON M20 20 MEQ tablet TAKE 1 TABLET BY MOUTH EVERY DAY Patient taking differently: Take 20 mEq by mouth daily. 01/18/21  Yes Ladell Pier, MD  lidocaine-prilocaine (EMLA) cream Apply 1 application topically as directed. Apply to port site 1-2 hours prior to stick and cover with plastic wrap to numb site 08/17/20  Yes Ladell Pier, MD  LORazepam (ATIVAN) 0.5 MG tablet Take 1 tablet (0.5 mg total) by mouth every 8 (eight) hours as needed (nausea). 09/01/20  Yes Owens Shark, NP  ondansetron (ZOFRAN) 8 MG tablet Take 1 tablet (8 mg total) by mouth every 8 (eight) hours as needed for nausea or vomiting. Start 72 hours after IV chemotherapy treatment 09/18/20  Yes Davonna Belling, MD  pantoprazole (PROTONIX) 40 MG tablet Take 40 mg by  mouth 2 (two) times daily. 01/12/21  Yes [provider]  polycarbophil (FIBERCON) 625 MG tablet Take 1 tablet (625 mg total) by mouth 2 (two) times daily. Patient taking differently: Take 625 mg by mouth daily. 08/03/20  Yes Little Ishikawa, MD  prochlorperazine (COMPAZINE) 10 MG tablet Take 1 tablet (10 mg total) by mouth every 6 (six) hours as needed for nausea or vomiting. 10/11/20  Yes Ladell Pier, MD  ACCU-CHEK GUIDE test strip USE AS INSTRUCTED TO CHECK BLOOD SUGAR 2 TIMES DAILY 10/18/20   [provider]  amLODipine (NORVASC) 5 MG tablet Take 1 tablet (5 mg total) by mouth daily. 08/04/20   Little Ishikawa, MD  HYDROcodone-acetaminophen (NORCO/VICODIN) 5-325 MG tablet Take 1 tablet by mouth every 6 (six) hours as needed for moderate pain. Patient not taking:  No sig reported 08/24/20   Johnathan Hausen, MD    Allergies    Bee venom and Latex  Review of Systems   Review of Systems  Unable to perform ROS: Mental status change  Constitutional:  Positive for activity change, appetite change, chills, fatigue and fever.  HENT:  Negative for congestion and sore throat.   Respiratory:  Negative for cough and shortness of breath.   Cardiovascular:  Negative for chest pain.  Gastrointestinal:  Positive for abdominal pain, constipation, nausea and vomiting. Negative for diarrhea.  Genitourinary:  Positive for difficulty urinating.  Skin:  Negative for rash.  Neurological:  Positive for headaches.   Physical Exam Updated Vital Signs BP (!) 146/87 (BP Location: Right Arm)   Pulse 96   Temp 99.6 F (37.6 C) (Oral)   Resp 14   Ht 6\' 1"  (1.854 m)   Wt 85.4 kg   SpO2 96%   BMI 24.84 kg/m   Physical Exam Vitals and nursing note reviewed.  Constitutional:      General: He is not in acute distress.    Appearance: He is well-developed. He is not diaphoretic.  HENT:     Head: Normocephalic and atraumatic.  Eyes:     Conjunctiva/sclera: Conjunctivae normal.  Neck:     Meningeal: Brudzinski's sign and Kernig's sign absent.  Cardiovascular:     Rate and Rhythm: Normal rate and regular rhythm.     Heart sounds: Normal heart sounds. No murmur heard.   No friction rub. No gallop.  Pulmonary:     Effort: Pulmonary effort is normal. No respiratory distress.     Breath sounds: Normal breath sounds. No wheezing or rales.  Abdominal:     General: There is no distension.     Palpations: Abdomen is soft.     Tenderness: There is abdominal tenderness. There is no guarding.  Musculoskeletal:     Cervical back: Normal range of motion. No rigidity. Normal range of motion.  Skin:    General: Skin is warm and dry.  Neurological:     Mental Status: He is alert and oriented to person, place, and time.     Cranial Nerves: No cranial nerve deficit, dysarthria  or facial asymmetry.     Motor: Motor function is intact. No weakness.     Comments: Able to state name, location, year Is easily distractable and needs redirection, continuously turning over in bed, questions need to be repeated, using EKG leads as a tissue to wipe his mouth but appears to appropriately answer questions, no focal weakness on exam, no facial droop, normal EOM, no dysarthria    ED Results / Procedures / Treatments  Labs (all labs ordered are listed, but only abnormal results are displayed) Labs Reviewed  LACTIC ACID, PLASMA - Abnormal; Notable for the following components:      Result Value   Lactic Acid, Venous 2.1 (*)    All other components within normal limits  LACTIC ACID, PLASMA - Abnormal; Notable for the following components:   Lactic Acid, Venous 2.4 (*)    All other components within normal limits  COMPREHENSIVE METABOLIC PANEL - Abnormal; Notable for the following components:   Sodium 133 (*)    Potassium 3.4 (*)    Chloride 94 (*)    Glucose, Bld 324 (*)    BUN 26 (*)    Creatinine, Ser 2.18 (*)    Albumin 3.4 (*)    Alkaline Phosphatase 169 (*)    Total Bilirubin 1.7 (*)    GFR, Estimated 34 (*)    All other components within normal limits  CBC WITH DIFFERENTIAL/PLATELET - Abnormal; Notable for the following components:   WBC 15.4 (*)    RBC 4.13 (*)    HCT 37.6 (*)    Neutro Abs 12.7 (*)    Monocytes Absolute 1.5 (*)    Abs Immature Granulocytes 0.10 (*)    All other components within normal limits  APTT - Abnormal; Notable for the following components:   aPTT 21 (*)    All other components within normal limits  URINALYSIS, ROUTINE W REFLEX MICROSCOPIC - Abnormal; Notable for the following components:   Specific Gravity, Urine 1.031 (*)    Glucose, UA >=500 (*)    Hgb urine dipstick SMALL (*)    Protein, ur >=300 (*)    All other components within normal limits  AMMONIA - Abnormal; Notable for the following components:   Ammonia 6 (*)     All other components within normal limits  OCCULT BLOOD GASTRIC / DUODENUM (SPECIMEN CUP) - Abnormal; Notable for the following components:   Occult Blood, Gastric POSITIVE (*)    All other components within normal limits  CBC - Abnormal; Notable for the following components:   WBC 15.6 (*)    RBC 3.81 (*)    Hemoglobin 12.0 (*)    HCT 35.2 (*)    All other components within normal limits  BASIC METABOLIC PANEL - Abnormal; Notable for the following components:   Sodium 134 (*)    Potassium 3.3 (*)    Chloride 96 (*)    Glucose, Bld 150 (*)    BUN 27 (*)    Creatinine, Ser 2.09 (*)    GFR, Estimated 36 (*)    All other components within normal limits  GLUCOSE, CAPILLARY - Abnormal; Notable for the following components:   Glucose-Capillary 167 (*)    All other components within normal limits  GLUCOSE, CAPILLARY - Abnormal; Notable for the following components:   Glucose-Capillary 118 (*)    All other components within normal limits  TROPONIN I (HIGH SENSITIVITY) - Abnormal; Notable for the following components:   Troponin I (High Sensitivity) 64 (*)    All other components within normal limits  RESP PANEL BY RT-PCR (FLU A&B, COVID) ARPGX2  CULTURE, BLOOD (ROUTINE X 2)  CULTURE, BLOOD (ROUTINE X 2)  URINE CULTURE  PROTIME-INR  RAPID URINE DRUG SCREEN, HOSP PERFORMED  MAGNESIUM  TSH  VITAMIN B12  LACTIC ACID, PLASMA  HEMOGLOBIN A1C  POC OCCULT BLOOD, ED  GASTRIC OCCULT BLOOD (1-CARD TO LAB)  TYPE AND SCREEN    EKG EKG Interpretation  Date/Time:  Friday April 01 2021 22:30:43 EST Ventricular Rate:  98 PR Interval:  136 QRS Duration: 97 QT Interval:  330 QTC Calculation: 422 R Axis:   -27 Text Interpretation: Sinus rhythm Probable left atrial enlargement Borderline left axis deviation Abnormal R-wave progression, late transition Abnormal T, consider ischemia, lateral leads Since prior ECG, TW inversions new Confirmed by Gareth Morgan [id number]) on 04/01/2021  11:00:50 PM  Radiology CT ABDOMEN PELVIS WO CONTRAST  Result Date: 04/02/2021 CLINICAL DATA:  Suspected bowel obstruction. Under treatment for colon cancer. EXAM: CT ABDOMEN AND PELVIS WITHOUT CONTRAST TECHNIQUE: Multidetector CT imaging of the abdomen and pelvis was performed following the standard protocol without IV contrast. COMPARISON:  12/03/2020. FINDINGS: Lower chest: The heart is normal in size and there is a trace pericardial effusion. The distal tip of a central venous catheter terminates in the right atrium. Dependent atelectasis is noted at the lung bases. Hepatobiliary: No focal liver abnormality is seen. No gallstones, gallbladder wall thickening, or biliary dilatation. Pancreas: Unremarkable. No pancreatic ductal dilatation or surrounding inflammatory changes. Spleen: Normal in size without focal abnormality. Adrenals/Urinary Tract: No adrenal nodule or mass. No renal or ureteral calculus or obstructive uropathy bilaterally. There is a cyst in the lower pole the right kidney measuring 1.7 cm. The urinary bladder is partially distended and a Foley catheter is noted. Stomach/Bowel: There is a small hiatal hernia with diffuse thickening in the mid to distal esophagus. No bowel obstruction, free air or pneumatosis. The patient is status post left hemicolectomy with an anastomotic site in the mid left abdomen. Evaluation of the bowel is limited due to lack of IV and oral contrast. The appendix is not visualized on exam. Vascular/Lymphatic: No significant vascular findings are present. No enlarged abdominal or pelvic lymph nodes. Reproductive: The prostate gland is enlarged measuring 6.1 cm in diameter. Other: No free fluid. There is a small fat containing inguinal hernia on the left. Musculoskeletal: No acute fracture or suspicious osseous abnormality. IMPRESSION: 1. No bowel obstruction or free air. 2. Small hiatal hernia with diffuse thickening of the mid to distal esophago is which may be  infectious or inflammatory. 3. Status post left hemicolectomy. Evaluation of the bowel is somewhat limited due to lack of IV and oral contrast. 4. Right renal cyst. 5. Enlarged prostate gland. Electronically Signed   By: Brett Fairy M.D.   On: 04/02/2021 00:43   DG Chest 2 View  Result Date: 04/02/2021 CLINICAL DATA:  Vomiting, colon cancer EXAM: CHEST - 2 VIEW COMPARISON:  04/01/2021 FINDINGS: Patient is rotated. Lungs are clear.  No pleural effusion or pneumothorax. Heart is top-normal in size. Left chest port terminates cavoatrial junction. IMPRESSION: No evidence of acute cardiopulmonary disease. Electronically Signed   By: Julian Hy M.D.   On: 04/02/2021 02:46   CT Head Wo Contrast  Result Date: 04/02/2021 CLINICAL DATA:  Delirium, altered mental status. EXAM: CT HEAD WITHOUT CONTRAST TECHNIQUE: Contiguous axial images were obtained from the base of the skull through the vertex without intravenous contrast. COMPARISON:  11/27/2005. FINDINGS: Brain: No acute intracranial hemorrhage, midline shift or mass effect. Generalized atrophy is noted. No extra-axial fluid collection is seen. Gray-white matter differentiation is within normal limits and there is no hydrocephalus. Vascular: No hyperdense vessel or unexpected calcification. Skull: Normal. Negative for fracture or focal lesion. Sinuses/Orbits: No acute finding. Other: None. IMPRESSION: No acute intracranial process. Electronically Signed   By: Brett Fairy M.D.   On: 04/02/2021 00:32   DG Chest [facility]  Result Date: 04/01/2021 CLINICAL DATA:  Coffee-ground emesis EXAM: PORTABLE CHEST 1 VIEW COMPARISON:  08/24/2020 FINDINGS: Left-sided central venous port tip over the right atrium. Incompletely included left lung base. Cardiomegaly without definitive acute airspace consolidation, pleural effusion or pneumothorax. IMPRESSION: Cardiomegaly. No definite acute airspace disease allowing for positioning and incomplete inclusion of left  base. Electronically Signed   By: Donavan Foil M.D.   On: 04/01/2021 22:21   US Abdomen Limited RUQ (LIVER/GB)  Result Date: 04/02/2021 CLINICAL DATA:  55 year old male with history of elevated liver function tests. EXAM: ULTRASOUND ABDOMEN LIMITED RIGHT UPPER QUADRANT COMPARISON:  Renal ultrasound 07/26/2020. FINDINGS: Gallbladder: No gallstones or wall thickening visualized. No sonographic Murphy sign noted by sonographer. Common bile duct: Diameter: 4 mm Liver: No focal lesion identified. Within normal limits in parenchymal echogenicity. Portal vein is patent on color Doppler imaging with normal direction of blood flow towards the liver. Other: Anechoic lesion with increased through transmission in the right kidney measuring 2.2 x 2.2 x 1.7 cm, compatible with a simple cyst. IMPRESSION: 1. No acute findings to account for the patient's symptoms. Specifically, no signs of biliary tract obstruction. No cholelithiasis or evidence of acute cholecystitis. Electronically Signed   By: Vinnie Langton M.D.   On: 04/02/2021 08:03    Procedures .Critical Care Performed by: Gareth Morgan, MD Authorized by: Gareth Morgan, MD   Critical care provider statement:    Critical care time (minutes):  30   Critical care was time spent personally by me on the following activities:  Development of treatment plan with patient or surrogate, evaluation of patient's response to treatment, examination of patient, ordering and review of laboratory studies, ordering and review of radiographic studies, ordering and performing treatments and interventions, pulse oximetry, re-evaluation of patient's condition and review of old charts   Medications Ordered in ED Medications  lactated ringers infusion ( Intravenous New Bag/Given 04/02/21 0529)  metroNIDAZOLE (FLAGYL) IVPB 500 mg (has no administration in time range)  ondansetron (ZOFRAN) injection 4 mg (has no administration in time range)  acetaminophen (TYLENOL)  tablet 650 mg (has no administration in time range)    Or  acetaminophen (TYLENOL) suppository 650 mg (has no administration in time range)  pantoprazole (PROTONIX) injection 40 mg (has no administration in time range)  insulin aspart (novoLOG) injection 0-9 Units (2 Units Subcutaneous Given 04/02/21 0538)  ceFEPIme (MAXIPIME) 2 g in sodium chloride 0.9 % 100 mL IVPB (has no administration in time range)  vancomycin (VANCOREADY) IVPB 1250 mg/250 mL (has no administration in time range)  sodium chloride flush (NS) 0.9 % injection 10-40 mL (has no administration in time range)  Chlorhexidine Gluconate Cloth 2 % PADS 6 each (has no administration in time range)  sodium chloride flush (NS) 0.9 % injection 10-40 mL (has no administration in time range)  sodium chloride flush (NS) 0.9 % injection 10-40 mL (has no administration in time range)  lactated ringers bolus 1,000 mL (0 mLs Intravenous Stopped 04/01/21 2324)  ceFEPIme (MAXIPIME) 2 g in sodium chloride 0.9 % 100 mL IVPB (0 g Intravenous Stopped 04/01/21 2241)  metroNIDAZOLE (FLAGYL) IVPB 500 mg (0 mg Intravenous Stopped 04/01/21 2324)  vancomycin (VANCOREADY) IVPB 2000 mg/400 mL (0 mg Intravenous Stopped 04/02/21 0112)  ondansetron (ZOFRAN) injection 4 mg (4 mg Intravenous Given 04/02/21 0235)  dicyclomine (BENTYL) injection 20 mg (20 mg Intramuscular Given 04/02/21 0237)  potassium chloride 10 mEq in 100 mL IVPB (10 mEq Intravenous New Bag/Given 04/02/21 0656)    ED  Course  I have reviewed the triage vital signs and the nursing notes.  Pertinent labs & imaging results that were available during my care of the patient were reviewed by me and considered in my medical decision making (see chart for details).    MDM Rules/Calculators/A&P                            55 year old male with a history of colon cancer, diabetes, hypertension, hyperlipidemia, necrotizing fasciitis, CKD, who presents with concern for 3 days of abdominal pain,  nausea, vomiting with confusion starting today.  Temperature elevated to 100.4, ordered empiric abx for sepsis of unknown etiology.  Noted to have emesis coffee ground, gastroccult positive. No melena on exam, hemoccult negative. Hgb 13, do not suspect clinically significant GI bleed, suspect possible mallory weiss related to multiple episodes of emesis. Labs show mild AKI, leukocytosis. No sign of hepatic encephalopathy. Influenza/covid negative.  Had urinary retention, foley catheter placed with return of greater than 1L of urine. No sign of infectoin.  No meningeal signs on exam, given had abdominal pain, n/v for days prior to confusion, headache at this time intraabdominal etiology and encephalopathy seem more likely. Has received empiric abx.  CT head and abdomen pending at time of transfer of care.     Final Clinical Impression(s) / ED Diagnoses Final diagnoses:  AKI (acute kidney injury) ([place])  Sepsis, due to unspecified organism, unspecified whether acute organ dysfunction present [facility])  Urinary retention  Generalized abdominal pain  Nausea and vomiting, unspecified vomiting type    Rx / DC Orders ED Discharge Orders     None        Gareth Morgan, MD 04/02/21 1106    Gareth Morgan, MD 04/02/21 1106

## 2021-04-01 NOTE — ED Triage Notes (Addendum)
Patient BIB [facility] from home. Being treated for colon cancer. Scheduled for the scan to see progression of treatment. Vomiting 3 days now. Coffee ground emesis. Alert and oriented x4. CBG 380.  EMS gave 4mg  Zofran

## 2021-04-02 ENCOUNTER — Inpatient Hospital Stay (HOSPITAL_COMMUNITY): Payer: 59

## 2021-04-02 ENCOUNTER — Emergency Department (HOSPITAL_COMMUNITY): Payer: 59

## 2021-04-02 DIAGNOSIS — I5042 Chronic combined systolic (congestive) and diastolic (congestive) heart failure: Secondary | ICD-10-CM | POA: Diagnosis not present

## 2021-04-02 DIAGNOSIS — R6520 Severe sepsis without septic shock: Secondary | ICD-10-CM | POA: Diagnosis present

## 2021-04-02 DIAGNOSIS — E860 Dehydration: Secondary | ICD-10-CM | POA: Diagnosis present

## 2021-04-02 DIAGNOSIS — K2101 Gastro-esophageal reflux disease with esophagitis, with bleeding: Secondary | ICD-10-CM | POA: Diagnosis not present

## 2021-04-02 DIAGNOSIS — A419 Sepsis, unspecified organism: Principal | ICD-10-CM

## 2021-04-02 DIAGNOSIS — C185 Malignant neoplasm of splenic flexure: Secondary | ICD-10-CM | POA: Diagnosis not present

## 2021-04-02 DIAGNOSIS — R17 Unspecified jaundice: Secondary | ICD-10-CM | POA: Diagnosis present

## 2021-04-02 DIAGNOSIS — N179 Acute kidney failure, unspecified: Secondary | ICD-10-CM | POA: Diagnosis not present

## 2021-04-02 DIAGNOSIS — Z20822 Contact with and (suspected) exposure to covid-19: Secondary | ICD-10-CM | POA: Diagnosis not present

## 2021-04-02 DIAGNOSIS — R112 Nausea with vomiting, unspecified: Secondary | ICD-10-CM

## 2021-04-02 DIAGNOSIS — G9340 Encephalopathy, unspecified: Secondary | ICD-10-CM

## 2021-04-02 DIAGNOSIS — K922 Gastrointestinal hemorrhage, unspecified: Secondary | ICD-10-CM | POA: Diagnosis present

## 2021-04-02 DIAGNOSIS — K2100 Gastro-esophageal reflux disease with esophagitis, without bleeding: Secondary | ICD-10-CM | POA: Diagnosis present

## 2021-04-02 DIAGNOSIS — K831 Obstruction of bile duct: Secondary | ICD-10-CM | POA: Diagnosis not present

## 2021-04-02 DIAGNOSIS — E1169 Type 2 diabetes mellitus with other specified complication: Secondary | ICD-10-CM | POA: Diagnosis present

## 2021-04-02 DIAGNOSIS — G9341 Metabolic encephalopathy: Secondary | ICD-10-CM | POA: Diagnosis not present

## 2021-04-02 DIAGNOSIS — K920 Hematemesis: Secondary | ICD-10-CM | POA: Diagnosis not present

## 2021-04-02 DIAGNOSIS — Z794 Long term (current) use of insulin: Secondary | ICD-10-CM | POA: Diagnosis not present

## 2021-04-02 DIAGNOSIS — N1831 Chronic kidney disease, stage 3a: Secondary | ICD-10-CM | POA: Diagnosis present

## 2021-04-02 DIAGNOSIS — G620 Drug-induced polyneuropathy: Secondary | ICD-10-CM | POA: Diagnosis not present

## 2021-04-02 DIAGNOSIS — E1165 Type 2 diabetes mellitus with hyperglycemia: Secondary | ICD-10-CM | POA: Diagnosis present

## 2021-04-02 DIAGNOSIS — D638 Anemia in other chronic diseases classified elsewhere: Secondary | ICD-10-CM | POA: Diagnosis not present

## 2021-04-02 DIAGNOSIS — E876 Hypokalemia: Secondary | ICD-10-CM

## 2021-04-02 DIAGNOSIS — R7989 Other specified abnormal findings of blood chemistry: Secondary | ICD-10-CM | POA: Diagnosis present

## 2021-04-02 DIAGNOSIS — C189 Malignant neoplasm of colon, unspecified: Secondary | ICD-10-CM | POA: Diagnosis not present

## 2021-04-02 DIAGNOSIS — R9431 Abnormal electrocardiogram [ECG] [EKG]: Secondary | ICD-10-CM | POA: Diagnosis not present

## 2021-04-02 DIAGNOSIS — E1122 Type 2 diabetes mellitus with diabetic chronic kidney disease: Secondary | ICD-10-CM | POA: Diagnosis present

## 2021-04-02 DIAGNOSIS — I152 Hypertension secondary to endocrine disorders: Secondary | ICD-10-CM | POA: Diagnosis present

## 2021-04-02 DIAGNOSIS — I130 Hypertensive heart and chronic kidney disease with heart failure and stage 1 through stage 4 chronic kidney disease, or unspecified chronic kidney disease: Secondary | ICD-10-CM | POA: Diagnosis not present

## 2021-04-02 DIAGNOSIS — I248 Other forms of acute ischemic heart disease: Secondary | ICD-10-CM | POA: Diagnosis not present

## 2021-04-02 HISTORY — DX: Sepsis, unspecified organism: R65.20

## 2021-04-02 HISTORY — DX: Gastrointestinal hemorrhage, unspecified: K92.2

## 2021-04-02 LAB — URINALYSIS, ROUTINE W REFLEX MICROSCOPIC
Bacteria, UA: NONE SEEN
Bilirubin Urine: NEGATIVE
Glucose, UA: >=500 mg/dL — AB
Ketones, ur: NEGATIVE mg/dL
Leukocytes,Ua: NEGATIVE
Nitrite: NEGATIVE
Protein, ur: >=300 mg/dL — AB
Specific Gravity, Urine: 1.031 — ABNORMAL HIGH (ref 1.005–1.030)
pH: 6 (ref 5.0–8.0)

## 2021-04-02 LAB — RAPID URINE DRUG SCREEN, HOSP PERFORMED
Amphetamines: NOT DETECTED
Barbiturates: NOT DETECTED
Benzodiazepines: NOT DETECTED
Cocaine: NOT DETECTED
Opiates: NOT DETECTED
Tetrahydrocannabinol: NOT DETECTED

## 2021-04-02 LAB — GLUCOSE, CAPILLARY
Glucose-Capillary: 167 mg/dL — ABNORMAL HIGH (ref 70–99)
Glucose-Capillary: 118 mg/dL — ABNORMAL HIGH (ref 70–99)
Glucose-Capillary: 111 mg/dL — ABNORMAL HIGH (ref 70–99)
Glucose-Capillary: 112 mg/dL — ABNORMAL HIGH (ref 70–99)
Glucose-Capillary: 125 mg/dL — ABNORMAL HIGH (ref 70–99)

## 2021-04-02 LAB — BASIC METABOLIC PANEL
Anion gap: 9 (ref 5–15)
BUN: 27 mg/dL — ABNORMAL HIGH (ref 6–20)
CO2: 29 mmol/L (ref 22–32)
Calcium: 9.3 mg/dL (ref 8.9–10.3)
Chloride: 96 mmol/L — ABNORMAL LOW (ref 98–111)
Creatinine, Ser: 2.09 mg/dL — ABNORMAL HIGH (ref 0.61–1.24)
GFR, Estimated: 36 mL/min — ABNORMAL LOW (ref >60)
Glucose, Bld: 150 mg/dL — ABNORMAL HIGH (ref 70–99)
Potassium: 3.3 mmol/L — ABNORMAL LOW (ref 3.5–5.1)
Sodium: 134 mmol/L — ABNORMAL LOW (ref 135–145)

## 2021-04-02 LAB — HEMOGLOBIN A1C
Hgb A1c MFr Bld: 11.4 % — ABNORMAL HIGH (ref 4.8–5.6)
Mean Plasma Glucose: 280.48 mg/dL

## 2021-04-02 LAB — CBC
HCT: 35.2 % — ABNORMAL LOW (ref 39.0–52.0)
Hemoglobin: 12 g/dL — ABNORMAL LOW (ref 13.0–17.0)
MCH: 31.5 pg (ref 26.0–34.0)
MCHC: 34.1 g/dL (ref 30.0–36.0)
MCV: 92.4 fL (ref 80.0–100.0)
Platelets: 233 10*3/uL (ref 150–400)
RBC: 3.81 MIL/uL — ABNORMAL LOW (ref 4.22–5.81)
RDW: 12.8 % (ref 11.5–15.5)
WBC: 15.6 10*3/uL — ABNORMAL HIGH (ref 4.0–10.5)
nRBC: 0 % (ref 0.0–0.2)

## 2021-04-02 LAB — LACTIC ACID, PLASMA
Lactic Acid, Venous: 1.4 mmol/L (ref 0.5–1.9)
Lactic Acid, Venous: 2.1 mmol/L (ref 0.5–1.9)

## 2021-04-02 LAB — TROPONIN I (HIGH SENSITIVITY)
Troponin I (High Sensitivity): 64 ng/L — ABNORMAL HIGH (ref <18)
Troponin I (High Sensitivity): 57 ng/L — ABNORMAL HIGH (ref <18)

## 2021-04-02 LAB — MAGNESIUM: Magnesium: 1.9 mg/dL (ref 1.7–2.4)

## 2021-04-02 LAB — TSH: TSH: 1.696 u[IU]/mL (ref 0.350–4.500)

## 2021-04-02 LAB — VITAMIN B12: Vitamin B-12: 384 pg/mL (ref 180–914)

## 2021-04-02 MED ORDER — METRONIDAZOLE 500 MG/100ML IV SOLN
500.0000 mg | Freq: Two times a day (BID) | INTRAVENOUS | Status: DC
  Administered 2021-04-02 – 2021-04-05 (×7): 500 mg via INTRAVENOUS
  Filled 2021-04-02 (×7): qty 100

## 2021-04-02 MED ORDER — LACTATED RINGERS IV SOLN: INTRAVENOUS | Status: AC

## 2021-04-02 MED ORDER — SODIUM CHLORIDE 0.9% FLUSH
10.0000 mL | Freq: Two times a day (BID) | INTRAVENOUS | Status: DC
  Administered 2021-04-03 – 2021-04-15 (×3): 10 mL

## 2021-04-02 MED ORDER — SODIUM CHLORIDE 0.9% FLUSH
10.0000 mL | Freq: Two times a day (BID) | INTRAVENOUS | Status: DC
  Administered 2021-04-02 – 2021-04-15 (×8): 10 mL

## 2021-04-02 MED ORDER — ACETAMINOPHEN 650 MG RE SUPP: 650.0000 mg | Freq: Four times a day (QID) | RECTAL | Status: DC | PRN

## 2021-04-02 MED ORDER — CHLORHEXIDINE GLUCONATE CLOTH 2 % EX PADS
6.0000 | MEDICATED_PAD | Freq: Every day | CUTANEOUS | Status: DC
  Administered 2021-04-02 – 2021-04-15 (×14): 6 via TOPICAL

## 2021-04-02 MED ORDER — DICYCLOMINE HCL 10 MG/ML IM SOLN
20.0000 mg | Freq: Once | INTRAMUSCULAR | Status: AC
  Administered 2021-04-02: 20 mg via INTRAMUSCULAR
  Filled 2021-04-02: qty 2

## 2021-04-02 MED ORDER — SODIUM CHLORIDE 0.9% FLUSH
10.0000 mL | INTRAVENOUS | Status: DC | PRN
  Administered 2021-04-09: 10 mL

## 2021-04-02 MED ORDER — PANTOPRAZOLE SODIUM 40 MG IV SOLR
40.0000 mg | Freq: Two times a day (BID) | INTRAVENOUS | Status: DC
  Administered 2021-04-02 – 2021-04-06 (×9): 40 mg via INTRAVENOUS
  Filled 2021-04-02 (×9): qty 40

## 2021-04-02 MED ORDER — INSULIN ASPART 100 UNIT/ML IJ SOLN
0.0000 [IU] | INTRAMUSCULAR | Status: DC
  Administered 2021-04-02: 2 [IU] via SUBCUTANEOUS
  Administered 2021-04-02 – 2021-04-03 (×2): 1 [IU] via SUBCUTANEOUS
  Administered 2021-04-03: 3 [IU] via SUBCUTANEOUS
  Administered 2021-04-03 – 2021-04-04 (×4): 1 [IU] via SUBCUTANEOUS
  Administered 2021-04-05: 2 [IU] via SUBCUTANEOUS
  Administered 2021-04-05 (×2): 1 [IU] via SUBCUTANEOUS
  Administered 2021-04-05: 2 [IU] via SUBCUTANEOUS
  Administered 2021-04-06: 1 [IU] via SUBCUTANEOUS
  Administered 2021-04-06 (×2): 2 [IU] via SUBCUTANEOUS
  Administered 2021-04-06: 1 [IU] via SUBCUTANEOUS
  Administered 2021-04-06: 2 [IU] via SUBCUTANEOUS
  Administered 2021-04-07 (×3): 1 [IU] via SUBCUTANEOUS
  Administered 2021-04-07 – 2021-04-08 (×3): 2 [IU] via SUBCUTANEOUS
  Administered 2021-04-08 (×2): 5 [IU] via SUBCUTANEOUS
  Administered 2021-04-08: 3 [IU] via SUBCUTANEOUS
  Administered 2021-04-08: 2 [IU] via SUBCUTANEOUS
  Administered 2021-04-08: 3 [IU] via SUBCUTANEOUS
  Administered 2021-04-09: 2 [IU] via SUBCUTANEOUS
  Administered 2021-04-09 (×2): 3 [IU] via SUBCUTANEOUS
  Administered 2021-04-09 (×3): 2 [IU] via SUBCUTANEOUS
  Administered 2021-04-10 (×4): 1 [IU] via SUBCUTANEOUS

## 2021-04-02 MED ORDER — ONDANSETRON HCL 4 MG/2ML IJ SOLN
4.0000 mg | Freq: Four times a day (QID) | INTRAMUSCULAR | Status: DC | PRN
  Administered 2021-04-02 – 2021-04-14 (×16): 4 mg via INTRAVENOUS
  Filled 2021-04-02 (×17): qty 2

## 2021-04-02 MED ORDER — VANCOMYCIN HCL 1250 MG/250ML IV SOLN
1250.0000 mg | INTRAVENOUS | Status: DC
  Administered 2021-04-02 – 2021-04-04 (×2): 1250 mg via INTRAVENOUS
  Filled 2021-04-02 (×3): qty 250

## 2021-04-02 MED ORDER — ONDANSETRON HCL 4 MG/2ML IJ SOLN
4.0000 mg | Freq: Once | INTRAMUSCULAR | Status: AC
  Administered 2021-04-02: 4 mg via INTRAVENOUS
  Filled 2021-04-02: qty 2

## 2021-04-02 MED ORDER — POTASSIUM CHLORIDE 10 MEQ/100ML IV SOLN
10.0000 meq | INTRAVENOUS | Status: AC
  Administered 2021-04-02 (×2): 10 meq via INTRAVENOUS
  Filled 2021-04-02 (×2): qty 100

## 2021-04-02 MED ORDER — ACETAMINOPHEN 325 MG PO TABS: 650.0000 mg | ORAL_TABLET | Freq: Four times a day (QID) | ORAL | Status: DC | PRN

## 2021-04-02 MED ORDER — CEFEPIME HCL 2 G IJ SOLR
2.0000 g | Freq: Two times a day (BID) | INTRAMUSCULAR | Status: DC
  Administered 2021-04-02 – 2021-04-04 (×6): 2 g via INTRAVENOUS
  Filled 2021-04-02 (×7): qty 2

## 2021-04-02 NOTE — Progress Notes (Signed)
Patient seen and examined this morning, admitted overnight, H&P reviewed and agree with the assessment and plan.  55 year old male with history of colon cancer, IDDM, HTN, HLD, chronic diastolic CHF, chronic kidney disease stage IIIa comes to the hospital with coffee-ground emesis and confusion.  He was febrile and mildly tachycardic on admission, had leukocytosis with a white count of 15.4.  Work-up in the ED did not show any obvious infectious source, urinalysis was clean, chest x-ray was negative for acute findings, UDS negative, but a CT scan of the abdomen pelvis showed small hiatal hernia with diffuse thickening of the mid to distal esophagus which may be infectious versus inflammatory.  Head CT was negative for acute findings.  He was lethargic, confused and was admitted to the hospital.  He was placed on broad-spectrum IV antibiotics  Upper GI bleed/esophagitis -Patient here for evaluation of coffee-ground emesis.  Hemoglobin stable. Gastric occult positive.  FOBT negative. CT showing small hiatal hernia and findings consistent with esophagitis.  Followed by Dr. Benson Norway and had EGD done in May 2022 which revealed LA grade D reflux esophagitis.   -Continue PPI, consulted gastroenterology, [facility] is covering Dr. Benson Norway over the weekend  Severe sepsis -Meets criteria for severe sepsis with fever, tachycardia, leukocytosis, and lactic acidosis.  COVID and influenza PCR negative.  UA without signs of infection.  Chest x-ray not suggestive of pneumonia.  No meningeal signs.  CT showing infectious versus inflammatory esophagitis.  Liver enzymes mildly elevated (cholestatic pattern).  Right upper quadrant ultrasound without acute findings -Continue broad-spectrum antibiotics at this time.  Continue to monitor cultures   AKI on CKD stage IIIa- Likely prerenal azotemia from dehydration/sepsis.  Baseline creatinine 1.3-1.5, on admission 2.1.  Continue fluids   Acute metabolic encephalopathy - Likely due to  sepsis/infection and AKI.  Head CT negative for acute finding.  Neuro exam nonfocal.  No meningeal signs.  No elevation of ammonia.  UDS negative.  Currently slightly confused and slow to respond to questions but does wake up and is appropriate -Continue antibiotics and IV fluid hydration.    Mild hypokalemia -Monitor potassium and magnesium levels, replenish as needed.   New T wave inversions -EKG showing T wave inversions in lateral leads, new since prior tracing from April 2022.  No chest pain.  Initial troponin in the 60s, trend -Repeat an EKG this morning   Colon cancer -Followed by Dr. Blair Promise and finished last cycle of chemotherapy in September 2022. Outpatient oncology follow-up   Insulin-dependent type 2 diabetes with hyperglycemia, poorly controlled -A1c 11.  Sliding scale insulin sensitive every 4 hours as patient is currently NPO.  Resume home basal insulin after pharmacy med rec is done.   Hypertension -Hold antihypertensives at this time in the setting of severe sepsis.   Chronic diastolic CHF -Last echo done in March 2022 showing EF 55 to 60% and indeterminate diastolic parameters.  No signs of volume overload at this time. Receiving IV fluids for AKI and sepsis.  Monitor volume status closely.    Patient had Foley catheter placed in the ED, unclear why. ?Acute urinary retention although not able to find any documentation.  Scheduled Meds:  Chlorhexidine Gluconate Cloth  6 each Topical Daily   insulin aspart  0-9 Units Subcutaneous Q4H   pantoprazole (PROTONIX) IV  40 mg Intravenous Q12H   sodium chloride flush  10-40 mL Intracatheter Q12H   sodium chloride flush  10-40 mL Intracatheter Q12H   Continuous Infusions:  ceFEPime (MAXIPIME) IV  lactated ringers 75 mL/hr at 04/02/21 0529   metronidazole     vancomycin     PRN Meds:.acetaminophen **OR** acetaminophen, ondansetron (ZOFRAN) IV, sodium chloride flush   Kaheem Halleck M. Cruzita Lederer, MD, PhD [facility]  Between 7 am - 7 pm you can contact me via Amion (for emergencies) or [place] (non urgent matters).  I am not available 7 pm - 7 am, please contact night coverage MD/APP via Amion

## 2021-04-02 NOTE — Consult Note (Addendum)
Consultation  Referring Provider:     Caren Griffins, MD Primary Care Physician:  [name], MD Primary Gastroenterologist:        Carol Ada, MD Reason for Consultation:     Coffee-ground emesis, esophagitis on CT         HPI:   Jeffrey Young is a 55 y.o. male with a history of colon cancer diagnosed 07/2020 s/p left hemicolectomy 07/2020 followed by chemotherapy which completed in 01/2021, along with history of diabetes, HTN, HLD, CHF (EF 55-60%), GERD with erosive esophagitis, admitted with coffee-ground emesis, altered mental status, fever, mild tachycardia.  Patient does not give much of a history on my exam today.  Notes reviewed and he reported 3-day history of nausea/vomiting with some coffee-ground emesis, along with hiccups.  ER evaluation notable for the following: - WBC 15.4, PLT 255 - H/H 13/37 with MCV/RDW 91/12 - Na 133, K3.4, BUN/creatinine 26/2.18 (baseline creatinine 1.3-1.5) - Albumin 3.4, AST/ALT 19/10, T bili 1.7, ALP 169 - FOBT negative - Lactate 2.4 --> 2.1 -->1.4 today - Negative/normal ammonia, COVID, flu, UA, UDS - CXR without acute cardiopulmonary disease - Blood cultures, urine cultures pending - Troponin 64 - CT abdomen/pelvis: Small HH, diffuse thickening of the mid to distal esophagus with appearance suggestive of infectious vs inflammatory.  Left hemicolectomy - CT head: No acute intracranial process - Was treated overnight with broad-spectrum ABX, antiemetics, LR, PPI  Repeat labs today notable for the following: - WBC 15.6 - H/H 12/35 - BUN/creatinine 27/2.1  Endoscopic History: - EGD (09/2020, Dr. Benson Norway): LA Grade D erosive esophagitis.  Normal stomach/duodenum - Colonoscopy (07/2020, Dr. Benson Norway): Large fungating mass in the descending colon  Past Medical History:  Diagnosis Date  . Arthritis    Hip  . Colon cancer ([place])   . Diabetic mononeuropathy associated with type 2 diabetes mellitus ([place]) 03/21/2017  . Diabetic retinopathy ([place])  07/31/2020  . Enthesopathy of ankle and tarsus 04/02/2009   Formatting of this note might be different from the original. Metatarsalgia  10/1 IMO update  . Erectile dysfunction associated with type 2 diabetes mellitus ([place]) 05/08/2019  . Hyperlipidemia 07/31/2020  . Hypertension associated with diabetes ([place]) 06/07/2019  . Microalbuminuria due to type 2 diabetes mellitus ([place]) 03/21/2017  . Necrotizing fasciitis of ankle and foot ([place]) 01/22/2018  . Necrotizing soft tissue infection   . Status post transmetatarsal amputation of left foot ([place]) 01/22/2018  . Systolic heart failure ([place]) 07/31/2020  . 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 Type 2 Diabetes Mellitus - Uncomplicated, Uncontrolled    Past Surgical History:  Procedure Laterality Date  . AMPUTATION Left 01/22/2018   Procedure: TRANSMETATARSAL AMPUTATION;  Surgeon: Newt Minion, MD;  Location: [facility];  Service: Orthopedics;  Laterality: Left;toes  . BIOPSY  07/27/2020   Procedure: BIOPSY;  Surgeon: Carol Ada, MD;  Location: [facility];  Service: Endoscopy;;  . COLON RESECTION N/A 07/30/2020   Procedure: HAND ASSISTED LAPAROSCOPIC LEFT HEMI COLECTOMY;  Surgeon: Johnathan Hausen, MD;  Location: [facility];  Service: General;  Laterality: N/A;  . COLONOSCOPY WITH PROPOFOL N/A 05/24/2018   Procedure: COLONOSCOPY WITH PROPOFOL;  Surgeon: Carol Ada, MD;  Location: [facility];  Service: Endoscopy;  Laterality: N/A;  . COLONOSCOPY WITH PROPOFOL N/A 07/27/2020   Procedure: COLONOSCOPY WITH PROPOFOL;  Surgeon: Carol Ada,  MD;  Location: [facility];  Service: Endoscopy;  Laterality: N/A;  . POLYPECTOMY  05/24/2018   Procedure: POLYPECTOMY;  Surgeon: Carol Ada, MD;  Location: [facility];  Service: Endoscopy;;  . PORTACATH PLACEMENT Left 08/24/2020    Procedure: INSERTION PORT-A-CATH;  Surgeon: Johnathan Hausen, MD;  Location: [facility];  Service: General;  Laterality: Left;  75/rm1  . SUBMUCOSAL TATTOO INJECTION  07/27/2020   Procedure: SUBMUCOSAL TATTOO INJECTION;  Surgeon: Carol Ada, MD;  Location: [facility];  Service: Endoscopy;;    Family History  Problem Relation Age of Onset  . Hypertension Father      Social History   Tobacco Use  . Smoking status: Never  . Smokeless tobacco: Never  Vaping Use  . Vaping Use: Never used  Substance Use Topics  . Alcohol use: Yes    Alcohol/week: 1.0 standard drink    Types: 1 Cans of beer per week    Comment: occasional  . Drug use: Never    Prior to Admission medications   Medication Sig Start Date End Date Taking? Authorizing Provider  atorvastatin (LIPITOR) 10 MG tablet Take 10 mg by mouth at bedtime. 05/08/19  Yes [provider]  carvedilol (COREG) 3.125 MG tablet TAKE 1 TABLET BY MOUTH 2 TIMES DAILY WITH A MEAL Patient taking differently: Take 3.125 mg by mouth 2 (two) times daily with a meal. 11/05/20  Yes Ladell Pier, MD  dexamethasone (DECADRON) 4 MG tablet Take 1 tablet (4 mg total) by mouth 2 (two) times daily. Take for 2 days after each chemotherapy treatment. Start on day 3 of each cycle 12/06/20  Yes Ladell Pier, MD  Insulin Glargine-Lixisenatide 100-33 UNT-MCG/ML SOPN Inject 10-15 Units into the skin See admin instructions. Inject 10 units into the skin in the morning and 15 units in the evening 06/10/20  Yes [provider]  KLOR-CON M20 20 MEQ tablet TAKE 1 TABLET BY MOUTH EVERY DAY Patient taking differently: Take 20 mEq by mouth daily. 01/18/21  Yes Ladell Pier, MD  lidocaine-prilocaine (EMLA) cream Apply 1 application topically as directed. Apply to port site 1-2 hours prior to stick and cover with plastic wrap to numb site 08/17/20  Yes Ladell Pier, MD  LORazepam (ATIVAN) 0.5 MG tablet Take 1 tablet (0.5 mg total) by mouth every 8  (eight) hours as needed (nausea). 09/01/20  Yes Owens Shark, NP  ondansetron (ZOFRAN) 8 MG tablet Take 1 tablet (8 mg total) by mouth every 8 (eight) hours as needed for nausea or vomiting. Start 72 hours after IV chemotherapy treatment 09/18/20  Yes Davonna Belling, MD  pantoprazole (PROTONIX) 40 MG tablet Take 40 mg by mouth 2 (two) times daily. 01/12/21  Yes [provider]  polycarbophil (FIBERCON) 625 MG tablet Take 1 tablet (625 mg total) by mouth 2 (two) times daily. Patient taking differently: Take 625 mg by mouth daily. 08/03/20  Yes Little Ishikawa, MD  prochlorperazine (COMPAZINE) 10 MG tablet Take 1 tablet (10 mg total) by mouth every 6 (six) hours as needed for nausea or vomiting. 10/11/20  Yes Ladell Pier, MD  ACCU-CHEK GUIDE test strip USE AS INSTRUCTED TO CHECK BLOOD SUGAR 2 TIMES DAILY 10/18/20   [provider]  amLODipine (NORVASC) 5 MG tablet Take 1 tablet (5 mg total) by mouth daily. 08/04/20   Little Ishikawa, MD  HYDROcodone-acetaminophen (NORCO/VICODIN) 5-325 MG tablet Take 1 tablet by mouth every 6 (six) hours as needed for moderate pain. Patient not taking:  No sig reported 08/24/20   Johnathan Hausen, MD    Current Facility-Administered Medications  Medication Dose Route Frequency Provider Last Rate Last Admin  . acetaminophen (TYLENOL) tablet 650 mg  650 mg Oral Q6H PRN Shela Leff, MD       Or  . acetaminophen (TYLENOL) suppository 650 mg  650 mg Rectal Q6H PRN Shela Leff, MD      . ceFEPIme (MAXIPIME) 2 g in sodium chloride 0.9 % 100 mL IVPB  2 g Intravenous Q12H Thomes Lolling, RPH      . Chlorhexidine Gluconate Cloth 2 % PADS 6 each  6 each Topical Daily Shela Leff, MD      . insulin aspart (novoLOG) injection 0-9 Units  0-9 Units Subcutaneous Q4H Shela Leff, MD   2 Units at 04/02/21 0538  . lactated ringers infusion   Intravenous Continuous Shela Leff, MD 75 mL/hr at 04/02/21 0529 New Bag at  04/02/21 0529  . metroNIDAZOLE (FLAGYL) IVPB 500 mg  500 mg Intravenous BID Shela Leff, MD      . ondansetron [facility]) injection 4 mg  4 mg Intravenous Q6H PRN Shela Leff, MD      . pantoprazole (PROTONIX) injection 40 mg  40 mg Intravenous Q12H Shela Leff, MD      . sodium chloride flush (NS) 0.9 % injection 10-40 mL  10-40 mL Intracatheter Q12H Shela Leff, MD      . sodium chloride flush (NS) 0.9 % injection 10-40 mL  10-40 mL Intracatheter Q12H Gherghe, Costin M, MD      . sodium chloride flush (NS) 0.9 % injection 10-40 mL  10-40 mL Intracatheter PRN Caren Griffins, MD      . vancomycin (VANCOREADY) IVPB 1250 mg/250 mL  1,250 mg Intravenous Q24H Thomes Lolling, [facility]        Allergies as of 04/01/2021 - Review Complete 04/01/2021  Allergen Reaction Noted  . Bee venom Swelling 01/21/2018  . Latex Rash 01/26/2018     Review of Systems:    As per HPI, otherwise negative    Physical Exam:  Vital signs in last 24 hours: Temp:  [98.9 F (37.2 C)-100.4 F (38 C)] 99.6 F (37.6 C) (11/12 0700) Pulse Rate:  [91-100] 96 (11/12 0700) Resp:  [14-27] 14 (11/12 0307) BP: (146-188)/(86-137) 146/87 (11/12 0700) SpO2:  [96 %-100 %] 96 % (11/12 0700) Weight:  [85.4 kg] 85.4 kg (11/11 2117)   General:   Patient lying in bed, responds to verbal cues by opening eyes, but confused and provides very limited history.  Otherwise NAD Lungs:  Respirations even and unlabored. Lungs clear to auscultation bilaterally.   No wheezes, crackles, or rhonchi.  Heart:  Regular rate and rhythm; no MRG Abdomen:  Soft, nondistended, nontender. Normal bowel sounds. No appreciable masses or hepatomegaly.  Extremities:  Without edema.  LAB RESULTS: Recent Labs    04/01/21 2130 04/02/21 0558  WBC 15.4* 15.6*  HGB 13.0 12.0*  HCT 37.6* 35.2*  PLT 255 233   BMET Recent Labs    04/01/21 2130 04/02/21 0558  NA 133* 134*  K 3.4* 3.3*  CL 94* 96*  CO2 28 29  GLUCOSE  324* 150*  BUN 26* 27*  CREATININE 2.18* 2.09*  CALCIUM 9.4 9.3   LFT Recent Labs    04/01/21 2130  PROT 7.6  ALBUMIN 3.4*  AST 19  ALT 10  ALKPHOS 169*  BILITOT 1.7*   PT/INR Recent Labs    04/01/21 2130  LABPROT  12.4  INR 0.9    STUDIES: CT ABDOMEN PELVIS WO CONTRAST  Result Date: 04/02/2021 CLINICAL DATA:  Suspected bowel obstruction. Under treatment for colon cancer. EXAM: CT ABDOMEN AND PELVIS WITHOUT CONTRAST TECHNIQUE: Multidetector CT imaging of the abdomen and pelvis was performed following the standard protocol without IV contrast. COMPARISON:  12/03/2020. FINDINGS: Lower chest: The heart is normal in size and there is a trace pericardial effusion. The distal tip of a central venous catheter terminates in the right atrium. Dependent atelectasis is noted at the lung bases. Hepatobiliary: No focal liver abnormality is seen. No gallstones, gallbladder wall thickening, or biliary dilatation. Pancreas: Unremarkable. No pancreatic ductal dilatation or surrounding inflammatory changes. Spleen: Normal in size without focal abnormality. Adrenals/Urinary Tract: No adrenal nodule or mass. No renal or ureteral calculus or obstructive uropathy bilaterally. There is a cyst in the lower pole the right kidney measuring 1.7 cm. The urinary bladder is partially distended and a Foley catheter is noted. Stomach/Bowel: There is a small hiatal hernia with diffuse thickening in the mid to distal esophagus. No bowel obstruction, free air or pneumatosis. The patient is status post left hemicolectomy with an anastomotic site in the mid left abdomen. Evaluation of the bowel is limited due to lack of IV and oral contrast. The appendix is not visualized on exam. Vascular/Lymphatic: No significant vascular findings are present. No enlarged abdominal or pelvic lymph nodes. Reproductive: The prostate gland is enlarged measuring 6.1 cm in diameter. Other: No free fluid. There is a small fat containing inguinal  hernia on the left. Musculoskeletal: No acute fracture or suspicious osseous abnormality. IMPRESSION: 1. No bowel obstruction or free air. 2. Small hiatal hernia with diffuse thickening of the mid to distal esophago is which may be infectious or inflammatory. 3. Status post left hemicolectomy. Evaluation of the bowel is somewhat limited due to lack of IV and oral contrast. 4. Right renal cyst. 5. Enlarged prostate gland. Electronically Signed   By: Brett Fairy M.D.   On: 04/02/2021 00:43   DG Chest 2 View  Result Date: 04/02/2021 CLINICAL DATA:  Vomiting, colon cancer EXAM: CHEST - 2 VIEW COMPARISON:  04/01/2021 FINDINGS: Patient is rotated. Lungs are clear.  No pleural effusion or pneumothorax. Heart is top-normal in size. Left chest port terminates cavoatrial junction. IMPRESSION: No evidence of acute cardiopulmonary disease. Electronically Signed   By: Julian Hy M.D.   On: 04/02/2021 02:46   CT Head Wo Contrast  Result Date: 04/02/2021 CLINICAL DATA:  Delirium, altered mental status. EXAM: CT HEAD WITHOUT CONTRAST TECHNIQUE: Contiguous axial images were obtained from the base of the skull through the vertex without intravenous contrast. COMPARISON:  11/27/2005. FINDINGS: Brain: No acute intracranial hemorrhage, midline shift or mass effect. Generalized atrophy is noted. No extra-axial fluid collection is seen. Gray-white matter differentiation is within normal limits and there is no hydrocephalus. Vascular: No hyperdense vessel or unexpected calcification. Skull: Normal. Negative for fracture or focal lesion. Sinuses/Orbits: No acute finding. Other: None. IMPRESSION: No acute intracranial process. Electronically Signed   By: Brett Fairy M.D.   On: 04/02/2021 00:32   DG Chest Port 1 View  Result Date: 04/01/2021 CLINICAL DATA:  Coffee-ground emesis EXAM: PORTABLE CHEST 1 VIEW COMPARISON:  08/24/2020 FINDINGS: Left-sided central venous port tip over the right atrium. Incompletely included  left lung base. Cardiomegaly without definitive acute airspace consolidation, pleural effusion or pneumothorax. IMPRESSION: Cardiomegaly. No definite acute airspace disease allowing for positioning and incomplete inclusion of left base. Electronically Signed  By: Donavan Foil M.D.   On: 04/01/2021 22:21   US Abdomen Limited RUQ (LIVER/GB)  Result Date: 04/02/2021 CLINICAL DATA:  55 year old male with history of elevated liver function tests. EXAM: ULTRASOUND ABDOMEN LIMITED RIGHT UPPER QUADRANT COMPARISON:  Renal ultrasound 07/26/2020. FINDINGS: Gallbladder: No gallstones or wall thickening visualized. No sonographic Murphy sign noted by sonographer. Common bile duct: Diameter: 4 mm Liver: No focal lesion identified. Within normal limits in parenchymal echogenicity. Portal vein is patent on color Doppler imaging with normal direction of blood flow towards the liver. Other: Anechoic lesion with increased through transmission in the right kidney measuring 2.2 x 2.2 x 1.7 cm, compatible with a simple cyst. IMPRESSION: 1. No acute findings to account for the patient's symptoms. Specifically, no signs of biliary tract obstruction. No cholelithiasis or evidence of acute cholecystitis. Electronically Signed   By: Vinnie Langton M.D.   On: 04/02/2021 08:03      Impression / Plan:   1) Coffee-ground emesis 2) Esophagitis 3) Nausea/Vomiting - 55 year old male with known history of GERD with erosive esophagitis on EGD in 09/2020 presents with 3-day history of nausea/vomiting, coffee-ground emesis, along with fever, confusion, tachycardia.  H/H essentially at baseline on arrival and FOBT negative - Continue Protonix 40 mg IV twice daily - N.p.o. for now given AMS and possible UGIB - Admission CT with diffuse thickening of the mid and distal esophagus - Tentative plan for EGD tomorrow pending clinical stability - Continue trending H/H  4) Altered mental status 5) Fever 6) SIRS criteria/sepsis - Started  on broad-spectrum ABX on arrival.  No clear source on admission UA, CXR.  CT head unrevealing - Continue ABX per primary service - Blood cultures, urine cultures pending - CT abdomen/pelvis otherwise without intra-abdominal pathology aside from esophagitis as above  7) Elevated T bili and ALP - Repeat liver enzymes today - Possible mild cholestasis of systemic injury/sepsis. - CT: Normal-appearing liver without biliary dilation - RUQ [place]: Normal-appearing liver without biliary dilation  8) AKI 9) Hypokalemia - IVF and continue management per primary service  10) Elevated troponin - No reported chest pain on admission notes.  TWI in lateral leads on admission EKG  - Repeat troponin and trend per primary service.  Depending on repeat EKG this morning and trop trend, may need Cardiology evaluation prior to endoscopy     LOS: 0 days   Lavena Bullion  04/02/2021, 10:56 AM

## 2021-04-02 NOTE — H&P (Addendum)
History and Physical    Pike Scantlebury TKP:[id number] DOB: [birth year] DOA: 04/01/2021  PCP: [name], MD Patient coming from: Home  Chief Complaint: Coffee-ground emesis  HPI: Jeffrey Young is a 55 y.o. male with medical history significant of colon cancer, insulin-dependent type 2 diabetes, hypertension, hyperlipidemia, chronic diastolic CHF presented to the ED for evaluation of coffee-ground emesis and confusion.  Febrile and mildly tachycardic.  Labs showing WBC 15.4, hemoglobin 13.0, platelet count 255k.  Sodium 133, potassium 3.4, chloride 94, bicarb 28, BUN 26, creatinine 2.1 (baseline 1.3-1.5), glucose 324.  Alk phos 169, T bili 1.7.  Transaminases normal.  Gastric occult positive.  FOBT negative.  COVID and influenza PCR negative.  Lactic acid 2.4 >2.1.  No elevation of ammonia.  Blood culture x2 pending.  UA without signs of infection.  Urine culture pending.  UDS negative.  Chest x-ray negative for acute finding.  CT abdomen pelvis showing small hiatal hernia with diffuse thickening of the mid to distal esophagus, may be infectious or inflammatory.  CT head negative for acute finding. Patient was given Bentyl, Zofran, cefepime, Flagyl, vancomycin, and 1 L LR bolus.  Patient slightly confused and slow to respond to questions.  Complaining of hiccups and vomiting.  Per wife, he is having coffee-ground emesis for the past 3 days.  He has not been able to eat and not having bowel movements.  He felt warm at home.  Patient denies abdominal pain.  Denies dysuria.  Denies cough or shortness of breath.  Per wife, he complained of chest pain at home only when vomiting.  Patient denies chest pain.  Denies NSAID or alcohol use.  Review of Systems:  All systems reviewed and apart from history of presenting illness, are negative.  Past Medical History:  Diagnosis Date   Arthritis    Hip   Colon cancer ([place])    Diabetic mononeuropathy associated with type 2 diabetes mellitus ([place]) 03/21/2017    Diabetic retinopathy ([place]) 07/31/2020   Enthesopathy of ankle and tarsus 04/02/2009   Formatting of this note might be different from the original. 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 Type 2 Diabetes Mellitus - Uncomplicated, Uncontrolled    Past Surgical History:  Procedure Laterality Date   AMPUTATION Left 01/22/2018   Procedure: TRANSMETATARSAL AMPUTATION;  Surgeon: Newt Minion, MD;  Location: [facility];  Service: Orthopedics;  Laterality: Left;toes   BIOPSY  07/27/2020   Procedure: BIOPSY;  Surgeon: Carol Ada, MD;  Location: [facility];  Service: Endoscopy;;   COLON RESECTION N/A 07/30/2020   Procedure: HAND ASSISTED LAPAROSCOPIC LEFT HEMI COLECTOMY;  Surgeon: Johnathan Hausen, MD;  Location: [facility];  Service: General;  Laterality: N/A;   COLONOSCOPY WITH PROPOFOL N/A 05/24/2018   Procedure: COLONOSCOPY WITH PROPOFOL;  Surgeon: Carol Ada, MD;  Location: [facility];  Service: Endoscopy;  Laterality: N/A;   COLONOSCOPY WITH PROPOFOL N/A 07/27/2020  Procedure: COLONOSCOPY WITH PROPOFOL;  Surgeon: Carol Ada, MD;  Location: [facility];  Service: Endoscopy;  Laterality: N/A;   POLYPECTOMY  05/24/2018   Procedure: POLYPECTOMY;  Surgeon: Carol Ada, MD;  Location: [facility];  Service: Endoscopy;;   PORTACATH PLACEMENT Left  08/24/2020   Procedure: INSERTION PORT-A-CATH;  Surgeon: Johnathan Hausen, MD;  Location: [facility];  Service: General;  Laterality: Left;  75/rm1   SUBMUCOSAL TATTOO INJECTION  07/27/2020   Procedure: SUBMUCOSAL TATTOO INJECTION;  Surgeon: Carol Ada, MD;  Location: [facility];  Service: Endoscopy;;     reports that he has never smoked. He has never used smokeless tobacco. He reports current alcohol use of about 1.0 standard drink per week. He reports that he does not use drugs.  Allergies  Allergen Reactions   Bee Venom Swelling    Cold Sweats   Latex Rash    Family History  Problem Relation Age of Onset   Hypertension Father     Prior to Admission medications   Medication Sig Start Date End Date Taking? Authorizing Provider  atorvastatin (LIPITOR) 10 MG tablet Take 10 mg by mouth at bedtime. 05/08/19  Yes [provider]  carvedilol (COREG) 3.125 MG tablet TAKE 1 TABLET BY MOUTH 2 TIMES DAILY WITH A MEAL Patient taking differently: Take 3.125 mg by mouth 2 (two) times daily with a meal. 11/05/20  Yes Ladell Pier, MD  dexamethasone (DECADRON) 4 MG tablet Take 1 tablet (4 mg total) by mouth 2 (two) times daily. Take for 2 days after each chemotherapy treatment. Start on day 3 of each cycle 12/06/20  Yes Ladell Pier, MD  Insulin Glargine-Lixisenatide 100-33 UNT-MCG/ML SOPN Inject 10-15 Units into the skin See admin instructions. Inject 10 units into the skin in the morning and 15 units in the evening 06/10/20  Yes [provider]  KLOR-CON M20 20 MEQ tablet TAKE 1 TABLET BY MOUTH EVERY DAY Patient taking differently: Take 20 mEq by mouth daily. 01/18/21  Yes Ladell Pier, MD  lidocaine-prilocaine (EMLA) cream Apply 1 application topically as directed. Apply to port site 1-2 hours prior to stick and cover with plastic wrap to numb site 08/17/20  Yes Ladell Pier, MD  LORazepam (ATIVAN) 0.5 MG tablet Take 1 tablet (0.5 mg total) by mouth every 8 (eight) hours  as needed (nausea). 09/01/20  Yes Owens Shark, NP  ondansetron (ZOFRAN) 8 MG tablet Take 1 tablet (8 mg total) by mouth every 8 (eight) hours as needed for nausea or vomiting. Start 72 hours after IV chemotherapy treatment 09/18/20  Yes Davonna Belling, MD  pantoprazole (PROTONIX) 40 MG tablet Take 40 mg by mouth 2 (two) times daily. 01/12/21  Yes [provider]  polycarbophil (FIBERCON) 625 MG tablet Take 1 tablet (625 mg total) by mouth 2 (two) times daily. Patient taking differently: Take 625 mg by mouth daily. 08/03/20  Yes Little Ishikawa, MD  prochlorperazine (COMPAZINE) 10 MG tablet Take 1 tablet (10 mg total) by mouth every 6 (six) hours as needed for nausea or vomiting. 10/11/20  Yes Ladell Pier, MD  ACCU-CHEK GUIDE test strip USE AS INSTRUCTED TO CHECK BLOOD SUGAR 2 TIMES DAILY 10/18/20   [provider]  amLODipine (NORVASC) 5 MG tablet Take 1 tablet (5 mg total) by mouth daily. 08/04/20   Little Ishikawa, MD  HYDROcodone-acetaminophen (NORCO/VICODIN) 5-325 MG tablet Take 1 tablet by mouth every 6 (six) hours as needed for moderate pain. Patient not taking: No sig  reported 08/24/20   Johnathan Hausen, MD    Physical Exam: Vitals:   04/02/21 0019 04/02/21 0020 04/02/21 0030 04/02/21 0307  BP:  (!) 157/137 (!) 154/116 (!) 157/86  Pulse:  95 98 91  Resp:  (!) 23 (!) 22 14  Temp: 98.9 F (37.2 C)   100.3 F (37.9 C)  TempSrc: Oral   Oral  SpO2:  96% 98% 99%  Weight:      Height:        Physical Exam Constitutional:      General: He is not in acute distress. HENT:     Head: Normocephalic and atraumatic.  Eyes:     Extraocular Movements: Extraocular movements intact.     Conjunctiva/sclera: Conjunctivae normal.  Cardiovascular:     Rate and Rhythm: Normal rate and regular rhythm.     Pulses: Normal pulses.  Pulmonary:     Effort: Pulmonary effort is normal. No respiratory distress.     Breath sounds: Normal breath sounds. No wheezing or rales.   Abdominal:     General: Bowel sounds are normal. There is no distension.     Palpations: Abdomen is soft.     Tenderness: There is no abdominal tenderness. There is no guarding or rebound.  Musculoskeletal:        General: No swelling or tenderness.     Cervical back: Normal range of motion and neck supple.  Skin:    General: Skin is warm and dry.  Neurological:     General: No focal deficit present.     Mental Status: He is alert and oriented to person, place, and time.     Comments: Slightly confused and slow to respond to questions     Labs on Admission: I have personally reviewed following labs and imaging studies  CBC: Recent Labs  Lab 04/01/21 2130  WBC 15.4*  NEUTROABS 12.7*  HGB 13.0  HCT 37.6*  MCV 91.0  PLT 144   Basic Metabolic Panel: Recent Labs  Lab 04/01/21 2130  NA 133*  K 3.4*  CL 94*  CO2 28  GLUCOSE 324*  BUN 26*  CREATININE 2.18*  CALCIUM 9.4   GFR: Estimated Creatinine Clearance: 42.3 mL/min (A) (by C-G formula based on SCr of 2.18 mg/dL (H)). Liver Function Tests: Recent Labs  Lab 04/01/21 2130  AST 19  ALT 10  ALKPHOS 169*  BILITOT 1.7*  PROT 7.6  ALBUMIN 3.4*   No results for input(s): LIPASE, AMYLASE in the last 168 hours. Recent Labs  Lab 04/01/21 2140  AMMONIA 6*   Coagulation Profile: Recent Labs  Lab 04/01/21 2130  INR 0.9   Cardiac Enzymes: No results for input(s): CKTOTAL, CKMB, CKMBINDEX, TROPONINI in the last 168 hours. BNP (last 3 results) No results for input(s): PROBNP in the last 8760 hours. HbA1C: No results for input(s): HGBA1C in the last 72 hours. CBG: No results for input(s): GLUCAP in the last 168 hours. Lipid Profile: No results for input(s): CHOL, HDL, LDLCALC, TRIG, CHOLHDL, LDLDIRECT in the last 72 hours. Thyroid Function Tests: No results for input(s): TSH, T4TOTAL, FREET4, T3FREE, THYROIDAB in the last 72 hours. Anemia Panel: No results for input(s): VITAMINB12, FOLATE, FERRITIN, TIBC,  IRON, RETICCTPCT in the last 72 hours. Urine analysis:    Component Value Date/Time   COLORURINE YELLOW 04/01/2021 2340   APPEARANCEUR CLEAR 04/01/2021 2340   LABSPEC 1.031 (H) 04/01/2021 2340   PHURINE 6.0 04/01/2021 2340   GLUCOSEU >=500 (A) 04/01/2021 2340   HGBUR SMALL (  A) 04/01/2021 2340   BILIRUBINUR NEGATIVE 04/01/2021 2340   KETONESUR NEGATIVE 04/01/2021 2340   PROTEINUR >=300 (A) 04/01/2021 2340   NITRITE NEGATIVE 04/01/2021 2340   LEUKOCYTESUR NEGATIVE 04/01/2021 2340    Radiological Exams on Admission: CT ABDOMEN PELVIS WO CONTRAST  Result Date: 04/02/2021 CLINICAL DATA:  Suspected bowel obstruction. Under treatment for colon cancer. EXAM: CT ABDOMEN AND PELVIS WITHOUT CONTRAST TECHNIQUE: Multidetector CT imaging of the abdomen and pelvis was performed following the standard protocol without IV contrast. COMPARISON:  12/03/2020. FINDINGS: Lower chest: The heart is normal in size and there is a trace pericardial effusion. The distal tip of a central venous catheter terminates in the right atrium. Dependent atelectasis is noted at the lung bases. Hepatobiliary: No focal liver abnormality is seen. No gallstones, gallbladder wall thickening, or biliary dilatation. Pancreas: Unremarkable. No pancreatic ductal dilatation or surrounding inflammatory changes. Spleen: Normal in size without focal abnormality. Adrenals/Urinary Tract: No adrenal nodule or mass. No renal or ureteral calculus or obstructive uropathy bilaterally. There is a cyst in the lower pole the right kidney measuring 1.7 cm. The urinary bladder is partially distended and a Foley catheter is noted. Stomach/Bowel: There is a small hiatal hernia with diffuse thickening in the mid to distal esophagus. No bowel obstruction, free air or pneumatosis. The patient is status post left hemicolectomy with an anastomotic site in the mid left abdomen. Evaluation of the bowel is limited due to lack of IV and oral contrast. The appendix is  not visualized on exam. Vascular/Lymphatic: No significant vascular findings are present. No enlarged abdominal or pelvic lymph nodes. Reproductive: The prostate gland is enlarged measuring 6.1 cm in diameter. Other: No free fluid. There is a small fat containing inguinal hernia on the left. Musculoskeletal: No acute fracture or suspicious osseous abnormality. IMPRESSION: 1. No bowel obstruction or free air. 2. Small hiatal hernia with diffuse thickening of the mid to distal esophago is which may be infectious or inflammatory. 3. Status post left hemicolectomy. Evaluation of the bowel is somewhat limited due to lack of IV and oral contrast. 4. Right renal cyst. 5. Enlarged prostate gland. Electronically Signed   By: Brett Fairy M.D.   On: 04/02/2021 00:43   DG Chest 2 View  Result Date: 04/02/2021 CLINICAL DATA:  Vomiting, colon cancer EXAM: CHEST - 2 VIEW COMPARISON:  04/01/2021 FINDINGS: Patient is rotated. Lungs are clear.  No pleural effusion or pneumothorax. Heart is top-normal in size. Left chest port terminates cavoatrial junction. IMPRESSION: No evidence of acute cardiopulmonary disease. Electronically Signed   By: Julian Hy M.D.   On: 04/02/2021 02:46   CT Head Wo Contrast  Result Date: 04/02/2021 CLINICAL DATA:  Delirium, altered mental status. EXAM: CT HEAD WITHOUT CONTRAST TECHNIQUE: Contiguous axial images were obtained from the base of the skull through the vertex without intravenous contrast. COMPARISON:  11/27/2005. FINDINGS: Brain: No acute intracranial hemorrhage, midline shift or mass effect. Generalized atrophy is noted. No extra-axial fluid collection is seen. Gray-white matter differentiation is within normal limits and there is no hydrocephalus. Vascular: No hyperdense vessel or unexpected calcification. Skull: Normal. Negative for fracture or focal lesion. Sinuses/Orbits: No acute finding. Other: None. IMPRESSION: No acute intracranial process. Electronically Signed   By:  Brett Fairy M.D.   On: 04/02/2021 00:32   DG Chest Port 1 View  Result Date: 04/01/2021 CLINICAL DATA:  Coffee-ground emesis EXAM: PORTABLE CHEST 1 VIEW COMPARISON:  08/24/2020 FINDINGS: Left-sided central venous port tip over the right atrium. Incompletely  included left lung base. Cardiomegaly without definitive acute airspace consolidation, pleural effusion or pneumothorax. IMPRESSION: Cardiomegaly. No definite acute airspace disease allowing for positioning and incomplete inclusion of left base. Electronically Signed   By: Donavan Foil M.D.   On: 04/01/2021 22:21    EKG: Independently reviewed.  Sinus rhythm, T wave inversions in lateral leads new since prior tracing from April 2022.  Assessment/Plan Principal Problem:   GI bleed Active Problems:   Severe sepsis (H[facility])   AKI (acute kidney injury) ([place])   Acute metabolic encephalopathy   Hypokalemia   Upper GI bleed/esophagitis Patient here for evaluation of coffee-ground emesis.  Hemoglobin stable. Gastric occult positive.  FOBT negative. CT showing small hiatal hernia and findings consistent with esophagitis.  Followed by Dr. Benson Norway and had EGD done in May 2022 which revealed LA grade D reflux esophagitis. -IV Protonix 40 mg every 12 hours.  Antiemetic as needed.  Monitor H&H.  Keep n.p.o. Dr. Benson Norway currently unavailable via secure chat/not on-call tonight, please consult in the morning.  Severe sepsis Meets criteria for severe sepsis with fever, tachycardia, leukocytosis, and lactic acidosis.  COVID and influenza PCR negative.  UA without signs of infection.  Chest x-ray not suggestive of pneumonia.  No meningeal signs.  CT showing infectious versus inflammatory esophagitis.  Liver enzymes mildly elevated (cholestatic pattern). -Continue broad-spectrum antibiotics at this time.  Tachycardia resolved after 1 L fluid bolus, not hypotensive.  Continue gentle IV fluid hydration.  Urine and blood cultures pending.  Trend lactate and WBC count.   Right upper quadrant ultrasound ordered.  AKI Likely prerenal azotemia from dehydration/sepsis. BUN 26, creatinine 2.1 (baseline 1.3-1.5). -Continue gentle IV fluid hydration.  Monitor renal function and urine output.  Avoid nephrotoxic agents.  Acute metabolic encephalopathy Likely due to sepsis/infection and AKI.  Head CT negative for acute finding.  Neuro exam nonfocal.  No meningeal signs.  No elevation of ammonia.  UDS negative.  Currently slightly confused and slow to respond to questions. -Continue antibiotics and IV fluid hydration.  Check TSH and B12 levels.  Mild hypokalemia -Monitor potassium and magnesium levels, replenish as needed.  New T wave inversions EKG showing T wave inversions in lateral leads, new since prior tracing from April 2022.  Patient is not endorsing chest pain. -Check troponin  Colon cancer Followed by Dr. Blair Promise and finished last cycle of chemotherapy in September 2022. -Outpatient oncology follow-up  Insulin-dependent type 2 diabetes with hyperglycemia No signs of DKA. -Check A1c.  Sliding scale insulin sensitive every 4 hours as patient is currently NPO.  Resume home basal insulin after pharmacy med rec is done.  Hypertension -Hold antihypertensives at this time in the setting of severe sepsis.  Chronic diastolic CHF Last echo done in March 2022 showing EF 55 to 60% and indeterminate diastolic parameters.  No signs of volume overload at this time. -Receiving IV fluids for AKI and sepsis.  Monitor volume status closely.  Addendum: Patient had Foley catheter placed in the ED, unclear why. ?Acute urinary retention although not able to find any documentation.  DVT prophylaxis: SCDs Code Status: Full code-discussed with the patient and his wife. Family Communication: Wife at bedside. Disposition Plan: Status is: Inpatient  Remains inpatient appropriate because: Severe sepsis, upper GI bleed  Level of care: Level of care: Telemetry  The  medical decision making on this patient was of high complexity and the patient is at high risk for clinical deterioration, therefore this is a level 3 visit.  Shela Leff  MD [facility]  If 7PM-7AM, please contact night-coverage [email or web address]  04/02/2021, 4:45 AM

## 2021-04-02 NOTE — Progress Notes (Signed)
Wife- Jeffrey Young

## 2021-04-02 NOTE — Progress Notes (Signed)
Pharmacy Antibiotic Note  Jeffrey Young is a 55 y.o. male admitted on 04/01/2021 with sepsis.  Pharmacy has been consulted for Vancomycin & Cefepime dosing.  Plan: Cefepime 2gm IV q12h Vancomycin 1250mg  IV q24h to target AUC 400-550 Check Vancomycin levels at steady state Monitor renal function and cx data   Height: 6\' 1"  (185.4 cm) Weight: 85.4 kg (188 lb 4.4 oz) IBW/kg (Calculated) : 79.9  Temp (24hrs), Avg:99.8 F (37.7 C), Min:98.9 F (37.2 C), Max:100.4 F (38 C)  Recent Labs  Lab 04/01/21 2130 04/01/21 2140 04/01/21 2333  WBC 15.4*  --   --   CREATININE 2.18*  --   --   LATICACIDVEN  --  2.4* 2.1*    Estimated Creatinine Clearance: 42.3 mL/min (A) (by C-G formula based on SCr of 2.18 mg/dL (H)).    Allergies  Allergen Reactions   Bee Venom Swelling    Cold Sweats   Latex Rash    Antimicrobials this admission: 11/11 Cefepime >>  11/11 Vancomycin >>  11/11 Metronidazole >>  Dose adjustments this admission:  Microbiology results: 11/11 BCx:  11/11 UCx:    Thank you for allowing pharmacy to be a part of this patient's care.  Netta Cedars PharmD 04/02/2021 4:51 AM

## 2021-04-03 ENCOUNTER — Inpatient Hospital Stay (HOSPITAL_COMMUNITY): Payer: 59

## 2021-04-03 DIAGNOSIS — R9431 Abnormal electrocardiogram [ECG] [EKG]: Secondary | ICD-10-CM

## 2021-04-03 DIAGNOSIS — N179 Acute kidney failure, unspecified: Secondary | ICD-10-CM | POA: Diagnosis not present

## 2021-04-03 DIAGNOSIS — G9341 Metabolic encephalopathy: Secondary | ICD-10-CM | POA: Diagnosis not present

## 2021-04-03 DIAGNOSIS — K920 Hematemesis: Secondary | ICD-10-CM | POA: Diagnosis not present

## 2021-04-03 DIAGNOSIS — R17 Unspecified jaundice: Secondary | ICD-10-CM | POA: Diagnosis not present

## 2021-04-03 LAB — COMPREHENSIVE METABOLIC PANEL
ALT: 9 U/L (ref 0–44)
AST: 17 U/L (ref 15–41)
Albumin: 2.6 g/dL — ABNORMAL LOW (ref 3.5–5.0)
Alkaline Phosphatase: 113 U/L (ref 38–126)
Anion gap: 10 (ref 5–15)
BUN: 29 mg/dL — ABNORMAL HIGH (ref 6–20)
CO2: 26 mmol/L (ref 22–32)
Calcium: 8.7 mg/dL — ABNORMAL LOW (ref 8.9–10.3)
Chloride: 102 mmol/L (ref 98–111)
Creatinine, Ser: 1.92 mg/dL — ABNORMAL HIGH (ref 0.61–1.24)
GFR, Estimated: 40 mL/min — ABNORMAL LOW (ref >60)
Glucose, Bld: 127 mg/dL — ABNORMAL HIGH (ref 70–99)
Potassium: 3.1 mmol/L — ABNORMAL LOW (ref 3.5–5.1)
Sodium: 138 mmol/L (ref 135–145)
Total Bilirubin: 1.9 mg/dL — ABNORMAL HIGH (ref 0.3–1.2)
Total Protein: 6.2 g/dL — ABNORMAL LOW (ref 6.5–8.1)

## 2021-04-03 LAB — ECHOCARDIOGRAM COMPLETE
AR max vel: 2.96 cm2
AV Area VTI: 2.91 cm2
AV Area mean vel: 3 cm2
AV Mean grad: 3 mmHg
AV Peak grad: 5.6 mmHg
Ao pk vel: 1.18 m/s
Area-P 1/2: 3.36 cm2
Height: 73 in
S' Lateral: 2.9 cm
Weight: 3012.37 oz

## 2021-04-03 LAB — GLUCOSE, CAPILLARY
Glucose-Capillary: 106 mg/dL — ABNORMAL HIGH (ref 70–99)
Glucose-Capillary: 108 mg/dL — ABNORMAL HIGH (ref 70–99)
Glucose-Capillary: 113 mg/dL — ABNORMAL HIGH (ref 70–99)
Glucose-Capillary: 123 mg/dL — ABNORMAL HIGH (ref 70–99)
Glucose-Capillary: 126 mg/dL — ABNORMAL HIGH (ref 70–99)
Glucose-Capillary: 138 mg/dL — ABNORMAL HIGH (ref 70–99)
Glucose-Capillary: 205 mg/dL — ABNORMAL HIGH (ref 70–99)

## 2021-04-03 LAB — CBC
HCT: 33.7 % — ABNORMAL LOW (ref 39.0–52.0)
Hemoglobin: 11.4 g/dL — ABNORMAL LOW (ref 13.0–17.0)
MCH: 32.1 pg (ref 26.0–34.0)
MCHC: 33.8 g/dL (ref 30.0–36.0)
MCV: 94.9 fL (ref 80.0–100.0)
Platelets: 187 10*3/uL (ref 150–400)
RBC: 3.55 MIL/uL — ABNORMAL LOW (ref 4.22–5.81)
RDW: 12.4 % (ref 11.5–15.5)
WBC: 11 10*3/uL — ABNORMAL HIGH (ref 4.0–10.5)
nRBC: 0 % (ref 0.0–0.2)

## 2021-04-03 LAB — URINE CULTURE: Culture: NO GROWTH

## 2021-04-03 LAB — MAGNESIUM: Magnesium: 1.8 mg/dL (ref 1.7–2.4)

## 2021-04-03 MED ORDER — ADULT MULTIVITAMIN W/MINERALS CH
1.0000 | ORAL_TABLET | Freq: Every day | ORAL | Status: DC
  Administered 2021-04-03 – 2021-04-15 (×9): 1 via ORAL
  Filled 2021-04-03 (×12): qty 1

## 2021-04-03 MED ORDER — POTASSIUM CHLORIDE CRYS ER 20 MEQ PO TBCR
40.0000 meq | EXTENDED_RELEASE_TABLET | Freq: Once | ORAL | Status: AC
  Administered 2021-04-03: 40 meq via ORAL
  Filled 2021-04-03: qty 2

## 2021-04-03 MED ORDER — BOOST / RESOURCE BREEZE PO LIQD CUSTOM
1.0000 | Freq: Three times a day (TID) | ORAL | Status: DC
  Administered 2021-04-03 – 2021-04-08 (×11): 1 via ORAL

## 2021-04-03 NOTE — Progress Notes (Signed)
PROGRESS NOTE  Jeffrey Young TIW:[id number] DOB: 1966/02/02 DOA: 04/01/2021 PCP: [name], MD   LOS: 1 day   Brief Narrative / Interim history: 55 year old male with history of colon cancer, IDDM, HTN, HLD, chronic diastolic CHF, chronic kidney disease stage IIIa comes to the hospital with coffee-ground emesis and confusion.  He was febrile and mildly tachycardic on admission, had leukocytosis with a white count of 15.4.  Work-up in the ED did not show any obvious infectious source, urinalysis was clean, chest x-ray was negative for acute findings, UDS negative, but a CT scan of the abdomen pelvis showed small hiatal hernia with diffuse thickening of the mid to distal esophagus which may be infectious versus inflammatory.  Head CT was negative for acute findings.  He was lethargic, confused and was admitted to the hospital.  He was placed on broad-spectrum IV antibiotics  Subjective / 24h Interval events: Much more alert this morning, continues to complain of nausea and vomiting.  Assessment & Plan: Principal Problem Upper GI bleed/esophagitis -Patient here for evaluation of coffee-ground emesis.  Hemoglobin stable. Gastric occult positive.  FOBT negative. CT showing small hiatal hernia and findings consistent with esophagitis.  Followed by Dr. Benson Norway and had EGD done in May 2022 which revealed LA grade D reflux esophagitis.   -Continue PPI, consulted gastroenterology, [facility] is covering Dr. Benson Norway over the weekend.  Likely needs an EGD but will obtain a 2D echo before due to T wave inversions on the EKG   Active Problems Severe sepsis -Meets criteria for severe sepsis with fever, tachycardia, leukocytosis, and lactic acidosis.  COVID and influenza PCR negative.  UA without signs of infection.  Chest x-ray not suggestive of pneumonia.  No meningeal signs.  CT showing infectious versus inflammatory esophagitis.  Liver enzymes mildly elevated (cholestatic pattern).  Right upper quadrant  ultrasound without acute findings -Continue broad-spectrum antibiotics at this time.  Continue to monitor cultures.  Clinically improving and he is much more alert today   AKI on CKD stage IIIa- Likely prerenal azotemia from dehydration/sepsis.  Baseline creatinine 1.3-1.5, on admission 2.1.  Continue fluids, creatinine improving to 1.9   Acute metabolic encephalopathy - Likely due to sepsis/infection and AKI.  Head CT negative for acute finding.  Neuro exam nonfocal.  No meningeal signs.  No elevation of ammonia.  UDS negative.  Much improved today with antibiotics and hydration.   Mild hypokalemia -Monitor potassium and magnesium levels, replenish as needed.   New T wave inversions, elevated troponinEKG showing T wave inversions in lateral leads, new since prior tracing from April 2022.  No chest pain.  Initial troponin in the 60s, trend -Troponins flat, likely demand ischemia, no anginal symptoms.  Obtain 2D echo   Colon cancer -Followed by Dr. Blair Promise and finished last cycle of chemotherapy in September 2022. Outpatient oncology follow-up.   Insulin-dependent type 2 diabetes with hyperglycemia, poorly controlled -A1c 11.  Continue every 4 sliding scale.   Hypertension -Hold antihypertensives at this time in the setting of severe sepsis.   Chronic diastolic CHF -Last echo done in March 2022 showing EF 55 to 60% and indeterminate diastolic parameters.  No signs of volume overload at this time. Receiving IV fluids for AKI and sepsis.  Monitor volume status closely.  Scheduled Meds:  Chlorhexidine Gluconate Cloth  6 each Topical Daily   insulin aspart  0-9 Units Subcutaneous Q4H   pantoprazole (PROTONIX) IV  40 mg Intravenous Q12H   potassium chloride  40 mEq Oral Once  sodium chloride flush  10-40 mL Intracatheter Q12H   sodium chloride flush  10-40 mL Intracatheter Q12H   Continuous Infusions:  ceFEPime (MAXIPIME) IV 2 g (04/02/21 2156)   metronidazole 500 mg (04/02/21 2235)    vancomycin 1,250 mg (04/02/21 2343)   PRN Meds:.acetaminophen **OR** acetaminophen, ondansetron (ZOFRAN) IV, sodium chloride flush  Diet Orders (From admission, onward)     Start     Ordered   04/03/21 0908  Diet clear liquid Room service appropriate? Yes; Fluid consistency: Thin  Diet effective now       Question Answer Comment  Room service appropriate? Yes   Fluid consistency: Thin      04/03/21 0907            DVT prophylaxis: SCDs Start: 04/02/21 0437     Code Status: Full Code  Family Communication: Wife present at bedside  Status is: Inpatient  Remains inpatient appropriate because: Persistent symptoms, IV antibiotics  Level of care: Telemetry  Consultants:  GI  Procedures:  2D echo: pending  Microbiology  none  Antimicrobials: Vanc, cefepime, metronidazole 11/12   Objective: Vitals:   04/02/21 1108 04/02/21 1608 04/02/21 2012 04/03/21 0524  BP: (!) 178/93 (!) 169/90 (!) 188/93 (!) 150/95  Pulse: 84 91 84 83  Resp: 18 16 16 15   Temp: 99.5 F (37.5 C) 99.9 F (37.7 C) 98.7 F (37.1 C) 99.6 F (37.6 C)  TempSrc: Oral Oral Oral Oral  SpO2: 97% 100% 100% 100%  Weight:      Height:        Intake/Output Summary (Last 24 hours) at 04/03/2021 1052 Last data filed at 04/02/2021 1839 Gross per 24 hour  Intake 200 ml  Output 325 ml  Net -125 ml   Filed Weights   04/01/21 2117  Weight: 85.4 kg    Examination:  Constitutional: NAD Eyes: no scleral icterus ENMT: Mucous membranes are moist.  Neck: normal, supple Respiratory: clear to auscultation bilaterally, no wheezing, no crackles.  Cardiovascular: Regular rate and rhythm, no murmurs / rubs / gallops. No LE edema.  Abdomen: non distended, no tenderness. Bowel sounds positive.  Musculoskeletal: no clubbing / cyanosis.  Skin: no rashes Neurologic: non focal  Psychiatric: Normal judgment and insight. Alert and oriented x 3.    Data Reviewed: I have independently reviewed following labs  and imaging studies  CBC: Recent Labs  Lab 04/01/21 2130 04/02/21 0558 04/03/21 0436  WBC 15.4* 15.6* 11.0*  NEUTROABS 12.7*  --   --   HGB 13.0 12.0* 11.4*  HCT 37.6* 35.2* 33.7*  MCV 91.0 92.4 94.9  PLT 255 233 333   Basic Metabolic Panel: Recent Labs  Lab 04/01/21 2130 04/02/21 0558 04/03/21 0436  NA 133* 134* 138  K 3.4* 3.3* 3.1*  CL 94* 96* 102  CO2 28 29 26   GLUCOSE 324* 150* 127*  BUN 26* 27* 29*  CREATININE 2.18* 2.09* 1.92*  CALCIUM 9.4 9.3 8.7*  MG  --  1.9 1.8   Liver Function Tests: Recent Labs  Lab 04/01/21 2130 04/03/21 0436  AST 19 17  ALT 10 9  ALKPHOS 169* 113  BILITOT 1.7* 1.9*  PROT 7.6 6.2*  ALBUMIN 3.4* 2.6*   Coagulation Profile: Recent Labs  Lab 04/01/21 2130  INR 0.9   HbA1C: Recent Labs    04/02/21 0558  HGBA1C 11.4*   CBG: Recent Labs  Lab 04/02/21 1605 04/02/21 2008 04/03/21 0046 04/03/21 0521 04/03/21 0944  GLUCAP 112* 125* 123* 138* 108*  Recent Results (from the past 240 hour(s))  Resp Panel by RT-PCR (Flu A&B, Covid) Nasopharyngeal Swab     Status: None   Collection Time: 04/01/21  9:39 PM   Specimen: Nasopharyngeal Swab; Nasopharyngeal(NP) swabs in vial transport medium  Result Value Ref Range Status   SARS Coronavirus 2 by RT PCR NEGATIVE NEGATIVE Final    Comment: (NOTE) SARS-CoV-2 target nucleic acids are NOT DETECTED.  The SARS-CoV-2 RNA is generally detectable in upper respiratory specimens during the acute phase of infection. The lowest concentration of SARS-CoV-2 viral copies this assay can detect is 138 copies/mL. A negative result does not preclude SARS-Cov-2 infection and should not be used as the sole basis for treatment or other patient management decisions. A negative result may occur with  improper specimen collection/handling, submission of specimen other than nasopharyngeal swab, presence of viral mutation(s) within the areas targeted by this assay, and inadequate number of  viral copies(<138 copies/mL). A negative result must be combined with clinical observations, patient history, and epidemiological information. The expected result is Negative.  Fact Sheet for Patients:  EntrepreneurPulse.com.au  Fact Sheet for Healthcare Providers:  IncredibleEmployment.be  This test is no t yet approved or cleared by the [place] FDA and  has been authorized for detection and/or diagnosis of SARS-CoV-2 by FDA under an Emergency Use Authorization (EUA). This EUA will remain  in effect (meaning this test can be used) for the duration of the COVID-19 declaration under Section 564(b)(1) of the Act, 21 U.S.C.section 360bbb-3(b)(1), unless the authorization is terminated  or revoked sooner.       Influenza A by PCR NEGATIVE NEGATIVE Final   Influenza B by PCR NEGATIVE NEGATIVE Final    Comment: (NOTE) The Xpert Xpress SARS-CoV-2/FLU/RSV plus assay is intended as an aid in the diagnosis of influenza from Nasopharyngeal swab specimens and should not be used as a sole basis for treatment. Nasal washings and aspirates are unacceptable for Xpert Xpress SARS-CoV-2/FLU/RSV testing.  Fact Sheet for Patients: EntrepreneurPulse.com.au  Fact Sheet for Healthcare Providers: IncredibleEmployment.be  This test is not yet approved or cleared by the [place] FDA and has been authorized for detection and/or diagnosis of SARS-CoV-2 by FDA under an Emergency Use Authorization (EUA). This EUA will remain in effect (meaning this test can be used) for the duration of the COVID-19 declaration under Section 564(b)(1) of the Act, 21 U.S.C. section 360bbb-3(b)(1), unless the authorization is terminated or revoked.  Performed at [facility], [place] [address], [place], [place] [address]   Blood Culture (routine x 2)     Status: None (Preliminary result)   Collection Time: 04/01/21  9:48  PM   Specimen: BLOOD  Result Value Ref Range Status   Specimen Description   Final    BLOOD LEFT ANTECUBITAL Performed at [facility] [address] [place], [place] [address]    Special Requests   Final    BOTTLES DRAWN AEROBIC AND ANAEROBIC Blood Culture results may not be optimal due to an excessive volume of blood received in culture bottles Performed at [facility] [address] [place], [place] [address]    Culture   Final    NO GROWTH 1 DAY Performed at [facility], [place] [address], [place], [place] [address]    Report Status PENDING  Incomplete  Blood Culture (routine x 2)     Status: None (Preliminary result)   Collection Time: 04/01/21 10:50 PM   Specimen: BLOOD  Result Value Ref Range Status   Specimen Description   Final    BLOOD BLOOD LEFT FOREARM Performed at [facility] [address], [place], [place] [address]    Special Requests   Final    BOTTLES DRAWN AEROBIC AND ANAEROBIC Blood Culture results may not be optimal due to an excessive volume of blood received in culture bottles Performed at [facility] [address], [place], [place] [address]    Culture   Final    NO GROWTH 1 DAY Performed at [facility], [place] [address] [place], [place] [address]    Report Status PENDING  Incomplete  Urine Culture     Status: None   Collection Time: 04/01/21 11:40 PM   Specimen: In/Out Cath Urine  Result Value Ref Range Status   Specimen Description   Final    IN/OUT CATH URINE Performed at [facility] [address] [place], [place] [address]    Special Requests   Final    NONE Performed at [facility], [place] [address] [place],  [address]    Culture   Final    NO GROWTH Performed at [facility], [place] [address] [place],  [address]    Report Status 04/03/2021 FINAL  Final     Radiology Studies: No  results found.   Marzetta Board, MD, PhD [facility]  Between 7 am - 7 pm I am available, please contact me via Amion (for emergencies) or Securechat (non urgent messages)  Between 7 pm - 7 am I am not available, please contact night coverage MD/APP via Amion

## 2021-04-03 NOTE — Progress Notes (Signed)
[facility] ROUNDING NOTE   Subjective: No acute events overnight.  Patient much more awake today and fully conversive.  Spouse at bedside.   Objective: Vital signs in last 24 hours: Temp:  [98.7 F (37.1 C)-99.9 F (37.7 C)] 99.6 F (37.6 C) (11/13 0524) Pulse Rate:  [83-91] 83 (11/13 0524) Resp:  [15-18] 15 (11/13 0524) BP: (150-188)/(90-95) 150/95 (11/13 0524) SpO2:  [97 %-100 %] 100 % (11/13 0524) Last BM Date:  (PTA) General: NAD, well conversive Lungs:  CTA b/l, no w/r/r Heart:  RRR, no m/r/g Abdomen:  Soft, NT, ND, +BS Ext:  No c/c/e Neuro: AAO x3    Intake/Output from previous day: 11/12 0701 - 11/13 0700 In: 200 [IV Piggyback:200] Out: 675 [Urine:625; Emesis/NG output:50] Intake/Output this shift: No intake/output data recorded.   Lab Results: Recent Labs    04/01/21 2130 04/02/21 0558 04/03/21 0436  WBC 15.4* 15.6* 11.0*  HGB 13.0 12.0* 11.4*  PLT 255 233 187  MCV 91.0 92.4 94.9   BMET Recent Labs    04/01/21 2130 04/02/21 0558 04/03/21 0436  NA 133* 134* 138  K 3.4* 3.3* 3.1*  CL 94* 96* 102  CO2 28 29 26   GLUCOSE 324* 150* 127*  BUN 26* 27* 29*  CREATININE 2.18* 2.09* 1.92*  CALCIUM 9.4 9.3 8.7*   LFT Recent Labs    04/01/21 2130 04/03/21 0436  PROT 7.6 6.2*  ALBUMIN 3.4* 2.6*  AST 19 17  ALT 10 9  ALKPHOS 169* 113  BILITOT 1.7* 1.9*   PT/INR Recent Labs    04/01/21 2130  INR 0.9      Imaging/Other results: CT ABDOMEN PELVIS WO CONTRAST  Result Date: 04/02/2021 CLINICAL DATA:  Suspected bowel obstruction. Under treatment for colon cancer. EXAM: CT ABDOMEN AND PELVIS WITHOUT CONTRAST TECHNIQUE: Multidetector CT imaging of the abdomen and pelvis was performed following the standard protocol without IV contrast. COMPARISON:  12/03/2020. FINDINGS: Lower chest: The heart is normal in size and there is a trace pericardial effusion. The distal tip of a central venous catheter terminates in the right atrium.  Dependent atelectasis is noted at the lung bases. Hepatobiliary: No focal liver abnormality is seen. No gallstones, gallbladder wall thickening, or biliary dilatation. Pancreas: Unremarkable. No pancreatic ductal dilatation or surrounding inflammatory changes. Spleen: Normal in size without focal abnormality. Adrenals/Urinary Tract: No adrenal nodule or mass. No renal or ureteral calculus or obstructive uropathy bilaterally. There is a cyst in the lower pole the right kidney measuring 1.7 cm. The urinary bladder is partially distended and a Foley catheter is noted. Stomach/Bowel: There is a small hiatal hernia with diffuse thickening in the mid to distal esophagus. No bowel obstruction, free air or pneumatosis. The patient is status post left hemicolectomy with an anastomotic site in the mid left abdomen. Evaluation of the bowel is limited due to lack of IV and oral contrast. The appendix is not visualized on exam. Vascular/Lymphatic: No significant vascular findings are present. No enlarged abdominal or pelvic lymph nodes. Reproductive: The prostate gland is enlarged measuring 6.1 cm in diameter. Other: No free fluid. There is a small fat containing inguinal hernia on the left. Musculoskeletal: No acute fracture or suspicious osseous abnormality. IMPRESSION: 1. No bowel obstruction or free air. 2. Small hiatal hernia with diffuse thickening of the mid to distal esophago is which may be infectious or inflammatory. 3. Status post left hemicolectomy. Evaluation of the bowel is somewhat limited due to lack of IV and oral contrast. 4. Right renal  cyst. 5. Enlarged prostate gland. Electronically Signed   By: Brett Fairy M.D.   On: 04/02/2021 00:43   DG Chest 2 View  Result Date: 04/02/2021 CLINICAL DATA:  Vomiting, colon cancer EXAM: CHEST - 2 VIEW COMPARISON:  04/01/2021 FINDINGS: Patient is rotated. Lungs are clear.  No pleural effusion or pneumothorax. Heart is top-normal in size. Left chest port terminates  cavoatrial junction. IMPRESSION: No evidence of acute cardiopulmonary disease. Electronically Signed   By: Julian Hy M.D.   On: 04/02/2021 02:46   CT Head Wo Contrast  Result Date: 04/02/2021 CLINICAL DATA:  Delirium, altered mental status. EXAM: CT HEAD WITHOUT CONTRAST TECHNIQUE: Contiguous axial images were obtained from the base of the skull through the vertex without intravenous contrast. COMPARISON:  11/27/2005. FINDINGS: Brain: No acute intracranial hemorrhage, midline shift or mass effect. Generalized atrophy is noted. No extra-axial fluid collection is seen. Gray-white matter differentiation is within normal limits and there is no hydrocephalus. Vascular: No hyperdense vessel or unexpected calcification. Skull: Normal. Negative for fracture or focal lesion. Sinuses/Orbits: No acute finding. Other: None. IMPRESSION: No acute intracranial process. Electronically Signed   By: Brett Fairy M.D.   On: 04/02/2021 00:32   DG Chest Port 1 View  Result Date: 04/01/2021 CLINICAL DATA:  Coffee-ground emesis EXAM: PORTABLE CHEST 1 VIEW COMPARISON:  08/24/2020 FINDINGS: Left-sided central venous port tip over the right atrium. Incompletely included left lung base. Cardiomegaly without definitive acute airspace consolidation, pleural effusion or pneumothorax. IMPRESSION: Cardiomegaly. No definite acute airspace disease allowing for positioning and incomplete inclusion of left base. Electronically Signed   By: Donavan Foil M.D.   On: 04/01/2021 22:21   US Abdomen Limited RUQ (LIVER/GB)  Result Date: 04/02/2021 CLINICAL DATA:  55 year old male with history of elevated liver function tests. EXAM: ULTRASOUND ABDOMEN LIMITED RIGHT UPPER QUADRANT COMPARISON:  Renal ultrasound 07/26/2020. FINDINGS: Gallbladder: No gallstones or wall thickening visualized. No sonographic Murphy sign noted by sonographer. Common bile duct: Diameter: 4 mm Liver: No focal lesion identified. Within normal limits in  parenchymal echogenicity. Portal vein is patent on color Doppler imaging with normal direction of blood flow towards the liver. Other: Anechoic lesion with increased through transmission in the right kidney measuring 2.2 x 2.2 x 1.7 cm, compatible with a simple cyst. IMPRESSION: 1. No acute findings to account for the patient's symptoms. Specifically, no signs of biliary tract obstruction. No cholelithiasis or evidence of acute cholecystitis. Electronically Signed   By: Vinnie Langton M.D.   On: 04/02/2021 08:03      Assessment and Plan:  1) Coffee-ground emesis 2) Esophagitis on CT 3) Nausea/Vomiting 4) History of GERD with erosive esophagitis  55 year old male with known history of GERD with erosive esophagitis presents with 3-day history of nausea/vomiting, coffee-ground emesis, along with septic physiology (fever, tachycardia, leukocytosis, confusion).  Admission CT with diffuse thickening of the mid to distal esophagus with appearance suggestive of infectious vs inflammatory changes.   - EGD in 09/2020 with LA Grade D erosive esophagitis - Continue IV PPI - Clears okay today with plan for EGD tomorrow pending cardiac work-up as below - No further nausea/vomiting since admission - H/H stable at 11.4/33.7 (baseline Hgb ~11-12) - N.p.o. at midnight  5) SIRS/sepsis 6) Lactic acidosis - Continue broad-spectrum ABX per primary Hospitalist service - Cultures pending/NGT - Will evaluate for infectious etiology at time of EGD  7) Elevated troponin with EKG changes - Admission work-up notable for elevated troponin with TWI in lateral leads.  Patient otherwise  without CP, SOB, DOE  - Troponin 64 --> 57 - Discussed with Dr. Cruzita Lederer and will plan for TTE today to r/o wall motion abnormality prior to proceeding with sedation for upper endoscopy  8) AKI 9) Hypokalemia - Renal function improving - Electrolyte management per primary service.  Will need K+ repletion prior to  sedation/endoscopy  10) Elevated T bili - Suspect mild cholestasis from sepsis - Continue trending - CT: Normal-appearing liver without biliary dilation - RUQ [place]: Normal-appearing liver without biliary dilation  I will continue following the patient throughout the remainder of the weekend, then Dr. Dallas Breeding. Collene Mares will assume his inpatient care on 11/14   [place], DO  04/03/2021, 9:08 AM  Gastroenterology Pager [phone]

## 2021-04-03 NOTE — Progress Notes (Signed)
Initial Nutrition Assessment  INTERVENTION:   -Boost Breeze po TID, each supplement provides 250 kcal and 9 grams of protein  -Multivitamin with minerals daily  NUTRITION DIAGNOSIS:   Increased nutrient needs related to cancer and cancer related treatments as evidenced by estimated needs.  GOAL:   Patient will meet greater than or equal to 90% of their needs  MONITOR:   PO intake, Supplement acceptance, Diet advancement, Labs, Weight trends, I & O's  REASON FOR ASSESSMENT:   Malnutrition Screening Tool    ASSESSMENT:   55 year old male with history of colon cancer, IDDM, HTN, HLD, chronic diastolic CHF, chronic kidney disease stage IIIa comes to the hospital with coffee-ground emesis and confusion.  Patient currently on clears, will be NPO tomorrow for EGD d/t GI bleed. Pt was having N/V PTA for 3 days. Pt undergoing treatment for colon cancer.  Will order Boost Breeze supplements while on clears.  Per weight records, pt has lost 6 lbs since 7/5 (3% wt loss x 4 months, insignificant for time frame).  Medications: KLOR-CON  Labs reviewed:  CBGs: 108-138 Low K  NUTRITION - FOCUSED PHYSICAL EXAM:  Unable to complete -working remote  Diet Order:   Diet Order             Diet clear liquid Room service appropriate? Yes; Fluid consistency: Thin  Diet effective now                   EDUCATION NEEDS:   No education needs have been identified at this time  Skin:  Skin Assessment: Reviewed RN Assessment  Last BM:  PTA  Height:   Ht Readings from Last 1 Encounters:  04/01/21 6\' 1"  (1.854 m)    Weight:   Wt Readings from Last 1 Encounters:  04/01/21 85.4 kg    BMI:  Body mass index is 24.84 kg/m.  Estimated Nutritional Needs:   Kcal:  2400-2600  Protein:  120-130g  Fluid:  2.4L/day  Clayton Bibles, MS, RD, LDN Inpatient Clinical Dietitian Contact information available via Amion

## 2021-04-04 ENCOUNTER — Telehealth: Payer: Self-pay

## 2021-04-04 ENCOUNTER — Inpatient Hospital Stay: Payer: 59

## 2021-04-04 ENCOUNTER — Ambulatory Visit (HOSPITAL_BASED_OUTPATIENT_CLINIC_OR_DEPARTMENT_OTHER): Payer: 59 | Attending: Oncology

## 2021-04-04 LAB — BASIC METABOLIC PANEL
Anion gap: 8 (ref 5–15)
BUN: 34 mg/dL — ABNORMAL HIGH (ref 6–20)
CO2: 26 mmol/L (ref 22–32)
Calcium: 8.3 mg/dL — ABNORMAL LOW (ref 8.9–10.3)
Chloride: 102 mmol/L (ref 98–111)
Creatinine, Ser: 1.8 mg/dL — ABNORMAL HIGH (ref 0.61–1.24)
GFR, Estimated: 43 mL/min — ABNORMAL LOW (ref >60)
Glucose, Bld: 117 mg/dL — ABNORMAL HIGH (ref 70–99)
Potassium: 3.2 mmol/L — ABNORMAL LOW (ref 3.5–5.1)
Sodium: 136 mmol/L (ref 135–145)

## 2021-04-04 LAB — GLUCOSE, CAPILLARY
Glucose-Capillary: 114 mg/dL — ABNORMAL HIGH (ref 70–99)
Glucose-Capillary: 116 mg/dL — ABNORMAL HIGH (ref 70–99)
Glucose-Capillary: 124 mg/dL — ABNORMAL HIGH (ref 70–99)
Glucose-Capillary: 107 mg/dL — ABNORMAL HIGH (ref 70–99)
Glucose-Capillary: 126 mg/dL — ABNORMAL HIGH (ref 70–99)
Glucose-Capillary: 118 mg/dL — ABNORMAL HIGH (ref 70–99)

## 2021-04-04 LAB — CBC
HCT: 31.6 % — ABNORMAL LOW (ref 39.0–52.0)
Hemoglobin: 10.7 g/dL — ABNORMAL LOW (ref 13.0–17.0)
MCH: 32 pg (ref 26.0–34.0)
MCHC: 33.9 g/dL (ref 30.0–36.0)
MCV: 94.6 fL (ref 80.0–100.0)
Platelets: 173 10*3/uL (ref 150–400)
RBC: 3.34 MIL/uL — ABNORMAL LOW (ref 4.22–5.81)
RDW: 12.5 % (ref 11.5–15.5)
WBC: 8.3 10*3/uL (ref 4.0–10.5)
nRBC: 0 % (ref 0.0–0.2)

## 2021-04-04 MED ORDER — SODIUM CHLORIDE 0.9 % IV SOLN: INTRAVENOUS | Status: DC

## 2021-04-04 NOTE — TOC Initial Note (Signed)
Transition of Care [facility]) - Initial/Assessment Note    Patient Details  Name: Jeffrey Young MRN: [id number] Date of Birth: [birth year]  Transition of Care [facility]) CM/SW Contact:    Leeroy Cha, RN Phone Number: 04/04/2021, 9:31 AM  Clinical Narrative:                 55 year old male with history of colon cancer, IDDM, HTN, HLD, chronic diastolic CHF, chronic kidney disease stage IIIa comes to the hospital with coffee-ground emesis and confusion.  He was febrile and mildly tachycardic on admission, had leukocytosis with a white count of 15.4.  Work-up in the ED did not show any obvious infectious source, urinalysis was clean, chest x-ray was negative for acute findings, UDS negative, but a CT scan of the abdomen pelvis showed small hiatal hernia with diffuse thickening of the mid to distal esophagus which may be infectious versus inflammatory.  Head CT was negative for acute findings.  He was lethargic, confused and was admitted to the hospital.  He was placed on broad-spectrum IV antibiotics   Subjective / 24h Interval events: Much more alert this morning, continues to complain of nausea and vomiting.   Assessment & Plan: Principal Problem Upper GI bleed/esophagitis -Patient here for evaluation of coffee-ground emesis.  Hemoglobin stable. Gastric occult positive.  FOBT negative. CT showing small hiatal hernia and findings consistent with esophagitis.  Followed by Dr. Benson Norway and had EGD done in May 2022 which revealed LA grade D reflux esophagitis.   -Continue PPI, consulted gastroenterology,  GI is covering Dr. Benson Norway over the weekend.  Likely needs an EGD but will obtain a 2D echo before due to T wave inversions on the EKG    Active Problems Severe sepsis -Meets criteria for severe sepsis with fever, tachycardia, leukocytosis, and lactic acidosis.  COVID and influenza PCR negative.  UA without signs of infection.  Chest x-ray not suggestive of pneumonia.  No meningeal signs.  CT  showing infectious versus inflammatory esophagitis.  Liver enzymes mildly elevated (cholestatic pattern).  Right upper quadrant ultrasound without acute findings -Continue broad-spectrum antibiotics at this time.  Continue to monitor cultures.  Clinically improving and he is much more alert today   AKI on CKD stage IIIa- Likely prerenal azotemia from dehydration/sepsis.  Baseline creatinine 1.3-1.5, on admission 2.1.  Continue fluids, creatinine improving to 1.9   Acute metabolic encephalopathy - Likely due to sepsis/infection and AKI.  Head CT negative for acute finding.  Neuro exam nonfocal.  No meningeal signs.  No elevation of ammonia.  UDS negative.  Much improved today with antibiotics and hydration.   Mild hypokalemia -Monitor potassium and magnesium levels, replenish as needed.   New T wave inversions, elevated troponinEKG showing T wave inversions in lateral leads, new since prior tracing from April 2022.  No chest pain.  Initial troponin in the 60s, trend -Troponins flat, likely demand ischemia, no anginal symptoms.  Obtain 2D echo   Colon cancer -Followed by Dr. Blair Promise and finished last cycle of chemotherapy in September 2022. Outpatient oncology follow-up.   Insulin-dependent type 2 diabetes with hyperglycemia, poorly controlled -A1c 11.  Continue every 4 sliding scale.   Hypertension -Hold antihypertensives at this time in the setting of severe sepsis.   Chronic diastolic CHF -Last echo done in March 2022 showing EF 55 to 60% and indeterminate diastolic parameters.  No signs of volume overload at this time. Receiving IV fluids for AKI and sepsis.  Monitor volume status closely  TOC PLAN OF  CARE: Following for home needs and progression Expected Discharge Plan: Home/Self Care Barriers to Discharge: Continued Medical Work up   Patient Goals and CMS Choice Patient states their goals for this hospitalization and ongoing recovery are:: to go home CMS Medicare.gov Compare Post  Acute Care list provided to:: Patient    Expected Discharge Plan and Services Expected Discharge Plan: Home/Self Care   Discharge Planning Services: CM Consult   Living arrangements for the past 2 months: Single Family Home                                      Prior Living Arrangements/Services Living arrangements for the past 2 months: Single Family Home Lives with:: Spouse Patient language and need for interpreter reviewed:: Yes Do you feel safe going back to the place where you live?: Yes            Criminal Activity/Legal Involvement Pertinent to Current Situation/Hospitalization: No - Comment as needed  Activities of Daily Living Home Assistive Devices/Equipment: Brace (specify type) ADL Screening (condition at time of admission) Patient's cognitive ability adequate to safely complete daily activities?: Yes Is the patient deaf or have difficulty hearing?: No Does the patient have difficulty seeing, even when wearing glasses/contacts?: Yes (wears glasses) Does the patient have difficulty concentrating, remembering, or making decisions?: No Patient able to express need for assistance with ADLs?: Yes Does the patient have difficulty dressing or bathing?: No (wife assit with socks occasionally) Independently performs ADLs?: Yes (appropriate for developmental age) Does the patient have difficulty walking or climbing stairs?: Yes Weakness of Legs: Both Weakness of Arms/Hands: Both  Permission Sought/Granted                  Emotional Assessment Appearance:: Appears stated age Attitude/Demeanor/Rapport: Engaged Affect (typically observed): Calm Orientation: : Oriented to Place, Oriented to Self, Oriented to  Time, Oriented to Situation Alcohol / Substance Use: Not Applicable Psych Involvement: No (comment)  Admission diagnosis:  GI bleed [K92.2] AKI (acute kidney injury) ([place]) [N17.9] Sepsis, due to unspecified organism, unspecified whether acute organ  dysfunction present [facility]) [A41.9] Patient Active Problem List   Diagnosis Date Noted   GI bleed 04/02/2021   Severe sepsis ([place]) 04/02/2021   AKI (acute kidney injury) ([place]) 72/01/4708   Acute metabolic encephalopathy 62/83/6629   Hypokalemia 04/02/2021   Encephalopathy    Nausea and vomiting    Coffee ground emesis    Elevated bilirubin    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 07/31/2020   IDA (iron deficiency anemia) from bleeding colon cancer 07/31/2020   Diabetic retinopathy ([place]) 07/31/2020   Hyperlipidemia 07/31/2020   Low back pain 07/31/2020   Shortness of breath 47/65/4650   Systolic heart failure ([place]) 07/31/2020   CKD (chronic kidney disease) stage 3, GFR 30-59 ml/min ([place]) 07/31/2020   Insulin-requiring or dependent type II diabetes mellitus ([place]) 07/31/2020   ARF (acute renal failure) ([place]) 07/25/2020   Symptomatic anemia    New onset of congestive heart failure ([place]) 07/24/2020   GIB (gastrointestinal bleeding) 07/24/2020   Hypertension associated with diabetes ([place]) 06/07/2019   Erectile dysfunction associated with type 2 diabetes mellitus ([place]) 05/08/2019   Sepsis ([place]) 01/22/2018   Diabetic ulcer of left foot ([place]) 01/22/2018   Status post transmetatarsal amputation of left foot ([place]) 01/22/2018   Diabetic mononeuropathy associated with type 2 diabetes mellitus ([place]) 03/21/2017   Microalbuminuria due to  type 2 diabetes mellitus ([place]) 03/21/2017   Vitamin D deficiency 04/06/2009   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

## 2021-04-04 NOTE — Progress Notes (Signed)
Subjective: 55 year old black male with a colon cancer [descending colon mass] diagnosed in March, this year, s/p left hemicolectomy followed by chemotherapy completed in September, this year, admitted with CGE with fever, tachycardia and altered mental status over the weekend. Noted to be septic but no source could be identified. CT scan showed diffuse thickening of the mis-to-distal esophagus consistent with infectious vs inflammatory esophagitis. EGD was planned for today but was postponed due to lack of cardiac clearance. Patient's symptoms started 4 days PTA. He had an EGD done by Dr. Benson Norway in May this year, that revealed Grade IV distal esophagitis. He seems to have improved with broad spectrum antibiotics. He is able to swallow liquids but cannot eat solids as "they come right back up".  Objective: Vital signs in last 24 hours: Temp:  [97.6 F (36.4 C)-99.2 F (37.3 C)] 98.8 F (37.1 C) (11/14 1205) Pulse Rate:  [71-85] 77 (11/14 1205) Resp:  [14-20] 20 (11/14 1205) BP: (155-180)/(88-103) 180/100 (11/14 1205) SpO2:  [100 %] 100 % (11/14 1205) Last BM Date:  (PTA)  Intake/Output from previous day: 11/13 0701 - 11/14 0700 In: 690 [P.O.:240; IV Piggyback:450] Out: 8546 [Urine:1625; Emesis/NG output:200] Intake/Output this shift: No intake/output data recorded.  General appearance: alert, cooperative, appears older than stated age, and no distress Resp: clear to auscultation bilaterally Cardio: regular rate and rhythm, S1, S2 normal, no murmur, click, rub or gallop GI: soft, non-tender; bowel sounds normal; no masses,  no organomegaly  Lab Results: Recent Labs    04/02/21 0558 04/03/21 0436 04/04/21 0433  WBC 15.6* 11.0* 8.3  HGB 12.0* 11.4* 10.7*  HCT 35.2* 33.7* 31.6*  PLT 233 187 173   BMET Recent Labs    04/02/21 0558 04/03/21 0436 04/04/21 0433  NA 134* 138 136  K 3.3* 3.1* 3.2*  CL 96* 102 102  CO2 29 26 26   GLUCOSE 150* 127* 117*  BUN 27* 29* 34*  CREATININE  2.09* 1.92* 1.80*  CALCIUM 9.3 8.7* 8.3*   LFT Recent Labs    04/03/21 0436  PROT 6.2*  ALBUMIN 2.6*  AST 17  ALT 9  ALKPHOS 113  BILITOT 1.9*   PT/INR Recent Labs    04/01/21 2130  LABPROT 12.4  INR 0.9   Studies/Results: ECHOCARDIOGRAM COMPLETE  Result Date: 04/03/2021    ECHOCARDIOGRAM REPORT   Patient Name:   Jeffrey Young Date of Exam: 04/03/2021 Medical Rec #:  [id number]      Height:       73.0 in Accession #:    [id number]     Weight:       188.3 lb Date of Birth:  December 24, 1965      BSA:          2.097 m Patient Age:    59 years       BP:           150/95 mmHg Patient Gender: M              HR:           75 bpm. Exam Location:  Inpatient Procedure: 2D Echo, Cardiac Doppler and Color Doppler Indications:    R94.31 Abnormal EKG  History:        Patient has prior history of Echocardiogram examinations, most                 recent 07/25/2020. Signs/Symptoms:Shortness of Breath; Risk  Factors:Diabetes and Hypertension.  Sonographer:    Glo Herring Referring Phys: [place]  1. Left ventricular ejection fraction, by estimation, is 60 to 65%. The left ventricle has normal function. The left ventricle has no regional wall motion abnormalities. There is mild left ventricular hypertrophy. Left ventricular diastolic parameters are consistent with Grade I diastolic dysfunction (impaired relaxation).  2. Right ventricular systolic function is normal. The right ventricular size is normal.  3. The mitral valve is normal in structure. No evidence of mitral valve regurgitation. No evidence of mitral stenosis.  4. The aortic valve is normal in structure. Aortic valve regurgitation is not visualized. No aortic stenosis is present.  5. The inferior vena cava is normal in size with greater than 50% respiratory variability, suggesting right atrial pressure of 3 mmHg. Comparison(s): Prior images unable to be directly viewed, comparison made by report only. Changes  from prior study are noted. FINDINGS  Left Ventricle: Left ventricular ejection fraction, by estimation, is 60 to 65%. The left ventricle has normal function. The left ventricle has no regional wall motion abnormalities. The left ventricular internal cavity size was normal in size. There is  mild left ventricular hypertrophy. Left ventricular diastolic parameters are consistent with Grade I diastolic dysfunction (impaired relaxation). Right Ventricle: The right ventricular size is normal. No increase in right ventricular wall thickness. Right ventricular systolic function is normal. Left Atrium: Left atrial size was normal in size. Right Atrium: Right atrial size was normal in size. Pericardium: There is no evidence of pericardial effusion. Mitral Valve: The mitral valve is normal in structure. No evidence of mitral valve regurgitation. No evidence of mitral valve stenosis. Tricuspid Valve: The tricuspid valve is normal in structure. Tricuspid valve regurgitation is not demonstrated. No evidence of tricuspid stenosis. Aortic Valve: The aortic valve is normal in structure. Aortic valve regurgitation is not visualized. No aortic stenosis is present. Aortic valve mean gradient measures 3.0 mmHg. Aortic valve peak gradient measures 5.6 mmHg. Aortic valve area, by VTI measures 2.91 cm. Pulmonic Valve: The pulmonic valve was normal in structure. Pulmonic valve regurgitation is not visualized. No evidence of pulmonic stenosis. Aorta: The aortic root is normal in size and structure. Venous: The inferior vena cava is normal in size with greater than 50% respiratory variability, suggesting right atrial pressure of 3 mmHg. IAS/Shunts: No atrial level shunt detected by color flow Doppler.  LEFT VENTRICLE PLAX 2D LVIDd:         4.10 cm   Diastology LVIDs:         2.90 cm   LV e' medial:    4.35 cm/s LV PW:         1.20 cm   LV E/e' medial:  18.1 LV IVS:        1.20 cm   LV e' lateral:   5.98 cm/s LVOT diam:     2.00 cm   LV  E/e' lateral: 13.2 LV SV:         58 LV SV Index:   28 LVOT Area:     3.14 cm  IVC IVC diam: 2.00 cm LEFT ATRIUM             Index LA diam:        2.90 cm 1.38 cm/m LA Vol (A2C):   55.0 ml 26.25 ml/m LA Vol (A4C):   40.2 ml 19.17 ml/m LA Biplane Vol: 53.2 ml 25.37 ml/m  AORTIC VALVE  PULMONIC VALVE AV Area (Vmax):    2.96 cm     PV Vmax:       1.03 m/s AV Area (Vmean):   3.00 cm     PV Peak grad:  4.3 mmHg AV Area (VTI):     2.91 cm AV Vmax:           118.00 cm/s AV Vmean:          74.900 cm/s AV VTI:            0.201 m AV Peak Grad:      5.6 mmHg AV Mean Grad:      3.0 mmHg LVOT Vmax:         111.00 cm/s LVOT Vmean:        71.600 cm/s LVOT VTI:          0.186 m LVOT/AV VTI ratio: 0.93  AORTA Ao Root diam: 3.60 cm MITRAL VALVE MV Area (PHT): 3.36 cm     SHUNTS MV Decel Time: 226 msec     Systemic VTI:  0.19 m MV E velocity: 78.80 cm/s   Systemic Diam: 2.00 cm MV A velocity: 111.00 cm/s MV E/A ratio:  0.71 Candee Furbish MD Electronically signed by Candee Furbish MD Signature Date/Time: 04/03/2021/12:47:19 PM    Final     Medications: I have reviewed the patient's current medications.  Assessment/Plan: 1) Coffee ground emesis/solid food dysphagia with abnormal CT scan showing thickening of the distal esophagus-EGD now planned for tomorrow. On IV PPI's. Hemoglobin 12-10.7 gms/dl today. 2) Personal history of colon cancer s/p left hemicolectomy and chemo. 3) Severe sepsis-no source identified. 4) AKI on CKDStage III A. 5) IDDM. 6) HTN. 7) New T wave inversions-elevated troponins ?demand ischemia. 8) Chronic diastolic CHF.  LOS: 2 days   Juanita Craver 04/04/2021, 4:26 PM

## 2021-04-04 NOTE — Telephone Encounter (Signed)
TC from Pt's wife stating pt was admitted to [facility] with vomiting and abdominal pain and was suppose to get a scan today informed Pt's wife that I will inform Dr Benay Spice that Pt is in the hospital to see if scan can be done while Pt is in the hospital.

## 2021-04-04 NOTE — Progress Notes (Signed)
PROGRESS NOTE  Jeffrey Young TIR:[id number] DOB: 10-30-1965 DOA: 04/01/2021 PCP: [name], MD   LOS: 2 days   Brief Narrative / Interim history: 55 year old male with history of colon cancer, IDDM, HTN, HLD, chronic diastolic CHF, chronic kidney disease stage IIIa comes to the hospital with coffee-ground emesis and confusion.  He was febrile and mildly tachycardic on admission, had leukocytosis with a white count of 15.4.  Work-up in the ED did not show any obvious infectious source, urinalysis was clean, chest x-ray was negative for acute findings, UDS negative, but a CT scan of the abdomen pelvis showed small hiatal hernia with diffuse thickening of the mid to distal esophagus which may be infectious versus inflammatory.  Head CT was negative for acute findings.  He was lethargic, confused and was admitted to the hospital.  He was placed on broad-spectrum IV antibiotics  Subjective / 24h Interval events: Continues to complain of nausea, vomiting and overall discomfort.  Remains alert  Assessment & Plan: Principal Problem Upper GI bleed/esophagitis -Patient here for evaluation of coffee-ground emesis.  Hemoglobin stable. Gastric occult positive.  FOBT negative. CT showing small hiatal hernia and findings consistent with esophagitis.  Followed by Dr. Benson Norway and had EGD done in May 2022 which revealed LA grade D reflux esophagitis.   -Continue PPI, consulted gastroenterology, to have EGD done today  Active Problems Severe sepsis -Meets criteria for severe sepsis with fever, tachycardia, leukocytosis, and lactic acidosis.  COVID and influenza PCR negative.  UA without signs of infection.  Chest x-ray not suggestive of pneumonia.  No meningeal signs.  CT showing infectious versus inflammatory esophagitis.  Liver enzymes mildly elevated (cholestatic pattern).  Right upper quadrant ultrasound without acute findings -Continue broad-spectrum antibiotics at this time.  Continue to monitor cultures.   Clinically remains improved, now alert   AKI on CKD stage IIIa- Likely prerenal azotemia from dehydration/sepsis.  Baseline creatinine 1.3-1.5, on admission 2.1.  Continue fluids, creatinine improving to 1.9   Acute metabolic encephalopathy - Likely due to sepsis/infection and AKI.  Head CT negative for acute finding.  Neuro exam nonfocal.  No meningeal signs.  No elevation of ammonia.  UDS negative.  Remains alert today and appears back to baseline   Mild hypokalemia -continue to monitor potassium levels   New T wave inversions, elevated troponinEKG showing T wave inversions in lateral leads, new since prior tracing from April 2022.  No chest pain.  Troponins overall flat, not in a pattern consistent with ACS -Troponins flat, likely demand ischemia, no anginal symptoms.  2D echo reassuring, normal EF, no wall motion abnormalities and grade 1 diastolic dysfunction.   Colon cancer -Followed by Dr. Blair Promise and finished last cycle of chemotherapy in September 2022. Outpatient oncology follow-up.   Insulin-dependent type 2 diabetes with hyperglycemia, poorly controlled -A1c 11.  Continue every 4 sliding scale.   Hypertension -Hold antihypertensives at this time in the setting of severe sepsis.   Chronic diastolic CHF -Last echo done in March 2022 showing EF 55 to 60% and indeterminate diastolic parameters.  No signs of volume overload at this time. Receiving IV fluids for AKI and sepsis.  Monitor volume status closely.  Scheduled Meds:  Chlorhexidine Gluconate Cloth  6 each Topical Daily   feeding supplement  1 Container Oral TID BM   insulin aspart  0-9 Units Subcutaneous Q4H   multivitamin with minerals  1 tablet Oral Daily   pantoprazole (PROTONIX) IV  40 mg Intravenous Q12H   sodium chloride flush  10-40 mL Intracatheter Q12H   sodium chloride flush  10-40 mL Intracatheter Q12H   Continuous Infusions:  ceFEPime (MAXIPIME) IV 2 g (04/04/21 0902)   metronidazole 500 mg (04/04/21 0901)    vancomycin 1,250 mg (04/04/21 0002)   PRN Meds:.acetaminophen **OR** acetaminophen, ondansetron (ZOFRAN) IV, sodium chloride flush  Diet Orders (From admission, onward)     Start     Ordered   04/04/21 0639  Diet NPO time specified  Diet effective now        04/04/21 5631            DVT prophylaxis: SCDs Start: 04/02/21 0437     Code Status: Full Code  Family Communication: Wife present at bedside  Status is: Inpatient  Remains inpatient appropriate because: Persistent symptoms, IV antibiotics  Level of care: Telemetry  Consultants:  GI  Procedures:  2D echo: pending  Microbiology  none  Antimicrobials: Vanc, cefepime, metronidazole 11/12   Objective: Vitals:   04/04/21 0300 04/04/21 0359 04/04/21 0400 04/04/21 0500  BP:  (!) 155/88    Pulse:  71    Resp: 17 18 17 19   Temp:  99.2 F (37.3 C)    TempSrc:  Oral    SpO2:  100%    Weight:      Height:        Intake/Output Summary (Last 24 hours) at 04/04/2021 1049 Last data filed at 04/04/2021 4970 Gross per 24 hour  Intake 690 ml  Output 1825 ml  Net -1135 ml    Filed Weights   04/01/21 2117  Weight: 85.4 kg    Examination:  Constitutional: NAD Eyes: Anicteric ENMT: Moist mucous membranes Neck: normal, supple Respiratory: Clear bilaterally, no wheezing, no crackles Cardiovascular: Regular rate and rhythm, no murmurs, no edema Abdomen: Soft, NT, ND, bowel sounds positive Musculoskeletal: no clubbing / cyanosis.  Skin: No rashes seen Neurologic: No focal deficits   Data Reviewed: I have independently reviewed following labs and imaging studies  CBC: Recent Labs  Lab 04/01/21 2130 04/02/21 0558 04/03/21 0436 04/04/21 0433  WBC 15.4* 15.6* 11.0* 8.3  NEUTROABS 12.7*  --   --   --   HGB 13.0 12.0* 11.4* 10.7*  HCT 37.6* 35.2* 33.7* 31.6*  MCV 91.0 92.4 94.9 94.6  PLT 255 233 187 263    Basic Metabolic Panel: Recent Labs  Lab 04/01/21 2130 04/02/21 0558 04/03/21 0436  04/04/21 0433  NA 133* 134* 138 136  K 3.4* 3.3* 3.1* 3.2*  CL 94* 96* 102 102  CO2 28 29 26 26   GLUCOSE 324* 150* 127* 117*  BUN 26* 27* 29* 34*  CREATININE 2.18* 2.09* 1.92* 1.80*  CALCIUM 9.4 9.3 8.7* 8.3*  MG  --  1.9 1.8  --     Liver Function Tests: Recent Labs  Lab 04/01/21 2130 04/03/21 0436  AST 19 17  ALT 10 9  ALKPHOS 169* 113  BILITOT 1.7* 1.9*  PROT 7.6 6.2*  ALBUMIN 3.4* 2.6*    Coagulation Profile: Recent Labs  Lab 04/01/21 2130  INR 0.9    HbA1C: Recent Labs    04/02/21 0558  HGBA1C 11.4*    CBG: Recent Labs  Lab 04/03/21 1616 04/03/21 2002 04/03/21 2344 04/04/21 0352 04/04/21 0732  GLUCAP 113* 126* 205* 114* 116*     Recent Results (from the past 240 hour(s))  Resp Panel by RT-PCR (Flu A&B, Covid) Nasopharyngeal Swab     Status: None   Collection Time: 04/01/21  9:39 PM  Specimen: Nasopharyngeal Swab; Nasopharyngeal(NP) swabs in vial transport medium  Result Value Ref Range Status   SARS Coronavirus 2 by RT PCR NEGATIVE NEGATIVE Final    Comment: (NOTE) SARS-CoV-2 target nucleic acids are NOT DETECTED.  The SARS-CoV-2 RNA is generally detectable in upper respiratory specimens during the acute phase of infection. The lowest concentration of SARS-CoV-2 viral copies this assay can detect is 138 copies/mL. A negative result does not preclude SARS-Cov-2 infection and should not be used as the sole basis for treatment or other patient management decisions. A negative result may occur with  improper specimen collection/handling, submission of specimen other than nasopharyngeal swab, presence of viral mutation(s) within the areas targeted by this assay, and inadequate number of viral copies(<138 copies/mL). A negative result must be combined with clinical observations, patient history, and epidemiological information. The expected result is Negative.  Fact Sheet for Patients:  EntrepreneurPulse.com.au  Fact Sheet  for Healthcare Providers:  IncredibleEmployment.be  This test is no t yet approved or cleared by the [place] FDA and  has been authorized for detection and/or diagnosis of SARS-CoV-2 by FDA under an Emergency Use Authorization (EUA). This EUA will remain  in effect (meaning this test can be used) for the duration of the COVID-19 declaration under Section 564(b)(1) of the Act, 21 U.S.C.section 360bbb-3(b)(1), unless the authorization is terminated  or revoked sooner.       Influenza A by PCR NEGATIVE NEGATIVE Final   Influenza B by PCR NEGATIVE NEGATIVE Final    Comment: (NOTE) The Xpert Xpress SARS-CoV-2/FLU/RSV plus assay is intended as an aid in the diagnosis of influenza from Nasopharyngeal swab specimens and should not be used as a sole basis for treatment. Nasal washings and aspirates are unacceptable for Xpert Xpress SARS-CoV-2/FLU/RSV testing.  Fact Sheet for Patients: EntrepreneurPulse.com.au  Fact Sheet for Healthcare Providers: IncredibleEmployment.be  This test is not yet approved or cleared by the [place] FDA and has been authorized for detection and/or diagnosis of SARS-CoV-2 by FDA under an Emergency Use Authorization (EUA). This EUA will remain in effect (meaning this test can be used) for the duration of the COVID-19 declaration under Section 564(b)(1) of the Act, 21 U.S.C. section 360bbb-3(b)(1), unless the authorization is terminated or revoked.  Performed at [facility], [place] [address] [place], [place] [address]   Blood Culture (routine x 2)     Status: None (Preliminary result)   Collection Time: 04/01/21  9:48 PM   Specimen: BLOOD  Result Value Ref Range Status   Specimen Description   Final    BLOOD LEFT ANTECUBITAL Performed at [facility] [address], [place], [place] [address]    Special Requests   Final    BOTTLES DRAWN AEROBIC AND  ANAEROBIC Blood Culture results may not be optimal due to an excessive volume of blood received in culture bottles Performed at [facility] [address], [place], [place] [address]    Culture   Final    NO GROWTH 2 DAYS Performed at [facility] [address], [place], [place] [address]    Report Status PENDING  Incomplete  Blood Culture (routine x 2)     Status: None (Preliminary result)   Collection Time: 04/01/21 10:50 PM   Specimen: BLOOD  Result Value Ref Range Status   Specimen Description   Final    BLOOD BLOOD LEFT FOREARM Performed at [facility] [address] [place], [place] [address]  Special Requests   Final    BOTTLES DRAWN AEROBIC AND ANAEROBIC Blood Culture results may not be optimal due to an excessive volume of blood received in culture bottles Performed at [facility] [address] [place], [place] [address]    Culture   Final    NO GROWTH 2 DAYS Performed at [facility] [address] [place], [place] [address]    Report Status PENDING  Incomplete  Urine Culture     Status: None   Collection Time: 04/01/21 11:40 PM   Specimen: In/Out Cath Urine  Result Value Ref Range Status   Specimen Description   Final    IN/OUT CATH URINE Performed at [facility] [address] [place], [place] [address]    Special Requests   Final    NONE Performed at [facility], [place] [address], [place], [place] [address]    Culture   Final    NO GROWTH Performed at [facility], [place] [address], [place], [place] [address]    Report Status 04/03/2021 FINAL  Final      Radiology Studies: ECHOCARDIOGRAM COMPLETE  Result Date: 04/03/2021    ECHOCARDIOGRAM REPORT   Patient Name:   Jeffrey Young Date of Exam: 04/03/2021 Medical Rec #:  [id number]      Height:       73.0 in Accession #:    [id number]     Weight:       188.3 lb Date of Birth:   [birth year]      BSA:          2.097 m Patient Age:    39 years       BP:           150/95 mmHg Patient Gender: M              HR:           75 bpm. Exam Location:  Inpatient Procedure: 2D Echo, Cardiac Doppler and Color Doppler Indications:    R94.31 Abnormal EKG  History:        Patient has prior history of Echocardiogram examinations, most                 recent 07/25/2020. Signs/Symptoms:Shortness of Breath; Risk                 Factors:Diabetes and Hypertension.  Sonographer:    Glo Herring Referring Phys: [place]  1. Left ventricular ejection fraction, by estimation, is 60 to 65%. The left ventricle has normal function. The left ventricle has no regional wall motion abnormalities. There is mild left ventricular hypertrophy. Left ventricular diastolic parameters are consistent with Grade I diastolic dysfunction (impaired relaxation).  2. Right ventricular systolic function is normal. The right ventricular size is normal.  3. The mitral valve is normal in structure. No evidence of mitral valve regurgitation. No evidence of mitral stenosis.  4. The aortic valve is normal in structure. Aortic valve regurgitation is not visualized. No aortic stenosis is present.  5. The inferior vena cava is normal in size with greater than 50% respiratory variability, suggesting right atrial pressure of 3 mmHg. Comparison(s): Prior images unable to be directly viewed, comparison made by report only. Changes from prior study are noted. FINDINGS  Left Ventricle: Left ventricular ejection fraction, by estimation, is 60 to 65%. The left ventricle has normal function. The left ventricle has no regional wall motion abnormalities.  The left ventricular internal cavity size was normal in size. There is  mild left ventricular hypertrophy. Left ventricular diastolic parameters are consistent with Grade I diastolic dysfunction (impaired relaxation). Right Ventricle: The right ventricular size is normal. No increase  in right ventricular wall thickness. Right ventricular systolic function is normal. Left Atrium: Left atrial size was normal in size. Right Atrium: Right atrial size was normal in size. Pericardium: There is no evidence of pericardial effusion. Mitral Valve: The mitral valve is normal in structure. No evidence of mitral valve regurgitation. No evidence of mitral valve stenosis. Tricuspid Valve: The tricuspid valve is normal in structure. Tricuspid valve regurgitation is not demonstrated. No evidence of tricuspid stenosis. Aortic Valve: The aortic valve is normal in structure. Aortic valve regurgitation is not visualized. No aortic stenosis is present. Aortic valve mean gradient measures 3.0 mmHg. Aortic valve peak gradient measures 5.6 mmHg. Aortic valve area, by VTI measures 2.91 cm. Pulmonic Valve: The pulmonic valve was normal in structure. Pulmonic valve regurgitation is not visualized. No evidence of pulmonic stenosis. Aorta: The aortic root is normal in size and structure. Venous: The inferior vena cava is normal in size with greater than 50% respiratory variability, suggesting right atrial pressure of 3 mmHg. IAS/Shunts: No atrial level shunt detected by color flow Doppler.  LEFT VENTRICLE PLAX 2D LVIDd:         4.10 cm   Diastology LVIDs:         2.90 cm   LV e' medial:    4.35 cm/s LV PW:         1.20 cm   LV E/e' medial:  18.1 LV IVS:        1.20 cm   LV e' lateral:   5.98 cm/s LVOT diam:     2.00 cm   LV E/e' lateral: 13.2 LV SV:         58 LV SV Index:   28 LVOT Area:     3.14 cm  IVC IVC diam: 2.00 cm LEFT ATRIUM             Index LA diam:        2.90 cm 1.38 cm/m LA Vol (A2C):   55.0 ml 26.25 ml/m LA Vol (A4C):   40.2 ml 19.17 ml/m LA Biplane Vol: 53.2 ml 25.37 ml/m  AORTIC VALVE                    PULMONIC VALVE AV Area (Vmax):    2.96 cm     PV Vmax:       1.03 m/s AV Area (Vmean):   3.00 cm     PV Peak grad:  4.3 mmHg AV Area (VTI):     2.91 cm AV Vmax:           118.00 cm/s AV Vmean:           74.900 cm/s AV VTI:            0.201 m AV Peak Grad:      5.6 mmHg AV Mean Grad:      3.0 mmHg LVOT Vmax:         111.00 cm/s LVOT Vmean:        71.600 cm/s LVOT VTI:          0.186 m LVOT/AV VTI ratio: 0.93  AORTA Ao Root diam: 3.60 cm MITRAL VALVE MV Area (PHT): 3.36 cm     SHUNTS MV Decel Time: 226 msec  Systemic VTI:  0.19 m MV E velocity: 78.80 cm/s   Systemic Diam: 2.00 cm MV A velocity: 111.00 cm/s MV E/A ratio:  0.71 Candee Furbish MD Electronically signed by Candee Furbish MD Signature Date/Time: 04/03/2021/12:47:19 PM    Final      Marzetta Board, MD, PhD [facility]  Between 7 am - 7 pm I am available, please contact me via Amion (for emergencies) or Securechat (non urgent messages)  Between 7 pm - 7 am I am not available, please contact night coverage MD/APP via Amion

## 2021-04-04 NOTE — Progress Notes (Signed)
Chaplain engaged in an initial visit with Jeffrey Young and his wife, [name].  Chaplain provided education around [place].  Through discussion, Chaplain learned that Yaakov wants to appoint his wife as his healthcare agent.  Chaplain did let them know that because they are legally married there is already a legally binding agreement in place for medical professionals to reach out to her for decision-making.  They also expressed wanting his dad and his step-daughter to have some decision-making power if [name] is unable to do so.  Chaplain also explained that because they are next-of-kin, they would also be empowered to make some decisions if [name] was unable to do so.  Chaplain asked them if they had ever had the conversation about what [name] desires if he is unable to speak for himself and [name] voiced that she knows what he wants.  Chaplain left the paperwork with them and offered support.  They expressed that Gardiner is having a procedure today to find out their next steps.  Chaplain offered words of comfort and hope.    Chaplain is available to follow-up as needed.     04/04/21 0900  Clinical Encounter Type  Visited With Patient and family together  Visit Type Initial;Spiritual support;Social support

## 2021-04-04 NOTE — H&P (View-Only) (Signed)
Subjective: 55 year old black male with a colon cancer [descending colon mass] diagnosed in March, this year, s/p left hemicolectomy followed by chemotherapy completed in September, this year, admitted with CGE with fever, tachycardia and altered mental status over the weekend. Noted to be septic but no source could be identified. CT scan showed diffuse thickening of the mis-to-distal esophagus consistent with infectious vs inflammatory esophagitis. EGD was planned for today but was postponed due to lack of cardiac clearance. Patient's symptoms started 4 days PTA. He had an EGD done by Dr. Benson Norway in May this year, that revealed Grade IV distal esophagitis. He seems to have improved with broad spectrum antibiotics. He is able to swallow liquids but cannot eat solids as "they come right back up".  Objective: Vital signs in last 24 hours: Temp:  [97.6 F (36.4 C)-99.2 F (37.3 C)] 98.8 F (37.1 C) (11/14 1205) Pulse Rate:  [71-85] 77 (11/14 1205) Resp:  [14-20] 20 (11/14 1205) BP: (155-180)/(88-103) 180/100 (11/14 1205) SpO2:  [100 %] 100 % (11/14 1205) Last BM Date:  (PTA)  Intake/Output from previous day: 11/13 0701 - 11/14 0700 In: 690 [P.O.:240; IV Piggyback:450] Out: 8546 [Urine:1625; Emesis/NG output:200] Intake/Output this shift: No intake/output data recorded.  General appearance: alert, cooperative, appears older than stated age, and no distress Resp: clear to auscultation bilaterally Cardio: regular rate and rhythm, S1, S2 normal, no murmur, click, rub or gallop GI: soft, non-tender; bowel sounds normal; no masses,  no organomegaly  Lab Results: Recent Labs    04/02/21 0558 04/03/21 0436 04/04/21 0433  WBC 15.6* 11.0* 8.3  HGB 12.0* 11.4* 10.7*  HCT 35.2* 33.7* 31.6*  PLT 233 187 173   BMET Recent Labs    04/02/21 0558 04/03/21 0436 04/04/21 0433  NA 134* 138 136  K 3.3* 3.1* 3.2*  CL 96* 102 102  CO2 29 26 26   GLUCOSE 150* 127* 117*  BUN 27* 29* 34*  CREATININE  2.09* 1.92* 1.80*  CALCIUM 9.3 8.7* 8.3*   LFT Recent Labs    04/03/21 0436  PROT 6.2*  ALBUMIN 2.6*  AST 17  ALT 9  ALKPHOS 113  BILITOT 1.9*   PT/INR Recent Labs    04/01/21 2130  LABPROT 12.4  INR 0.9   Studies/Results: ECHOCARDIOGRAM COMPLETE  Result Date: 04/03/2021    ECHOCARDIOGRAM REPORT   Patient Name:   Jeffrey Young Date of Exam: 04/03/2021 Medical Rec #:  [id number]      Height:       73.0 in Accession #:    [id number]     Weight:       188.3 lb Date of Birth:  December 24, 1965      BSA:          2.097 m Patient Age:    59 years       BP:           150/95 mmHg Patient Gender: M              HR:           75 bpm. Exam Location:  Inpatient Procedure: 2D Echo, Cardiac Doppler and Color Doppler Indications:    R94.31 Abnormal EKG  History:        Patient has prior history of Echocardiogram examinations, most                 recent 07/25/2020. Signs/Symptoms:Shortness of Breath; Risk  Factors:Diabetes and Hypertension.  Sonographer:    Glo Herring Referring Phys: Randall  1. Left ventricular ejection fraction, by estimation, is 60 to 65%. The left ventricle has normal function. The left ventricle has no regional wall motion abnormalities. There is mild left ventricular hypertrophy. Left ventricular diastolic parameters are consistent with Grade I diastolic dysfunction (impaired relaxation).  2. Right ventricular systolic function is normal. The right ventricular size is normal.  3. The mitral valve is normal in structure. No evidence of mitral valve regurgitation. No evidence of mitral stenosis.  4. The aortic valve is normal in structure. Aortic valve regurgitation is not visualized. No aortic stenosis is present.  5. The inferior vena cava is normal in size with greater than 50% respiratory variability, suggesting right atrial pressure of 3 mmHg. Comparison(s): Prior images unable to be directly viewed, comparison made by report only. Changes  from prior study are noted. FINDINGS  Left Ventricle: Left ventricular ejection fraction, by estimation, is 60 to 65%. The left ventricle has normal function. The left ventricle has no regional wall motion abnormalities. The left ventricular internal cavity size was normal in size. There is  mild left ventricular hypertrophy. Left ventricular diastolic parameters are consistent with Grade I diastolic dysfunction (impaired relaxation). Right Ventricle: The right ventricular size is normal. No increase in right ventricular wall thickness. Right ventricular systolic function is normal. Left Atrium: Left atrial size was normal in size. Right Atrium: Right atrial size was normal in size. Pericardium: There is no evidence of pericardial effusion. Mitral Valve: The mitral valve is normal in structure. No evidence of mitral valve regurgitation. No evidence of mitral valve stenosis. Tricuspid Valve: The tricuspid valve is normal in structure. Tricuspid valve regurgitation is not demonstrated. No evidence of tricuspid stenosis. Aortic Valve: The aortic valve is normal in structure. Aortic valve regurgitation is not visualized. No aortic stenosis is present. Aortic valve mean gradient measures 3.0 mmHg. Aortic valve peak gradient measures 5.6 mmHg. Aortic valve area, by VTI measures 2.91 cm. Pulmonic Valve: The pulmonic valve was normal in structure. Pulmonic valve regurgitation is not visualized. No evidence of pulmonic stenosis. Aorta: The aortic root is normal in size and structure. Venous: The inferior vena cava is normal in size with greater than 50% respiratory variability, suggesting right atrial pressure of 3 mmHg. IAS/Shunts: No atrial level shunt detected by color flow Doppler.  LEFT VENTRICLE PLAX 2D LVIDd:         4.10 cm   Diastology LVIDs:         2.90 cm   LV e' medial:    4.35 cm/s LV PW:         1.20 cm   LV E/e' medial:  18.1 LV IVS:        1.20 cm   LV e' lateral:   5.98 cm/s LVOT diam:     2.00 cm   LV  E/e' lateral: 13.2 LV SV:         58 LV SV Index:   28 LVOT Area:     3.14 cm  IVC IVC diam: 2.00 cm LEFT ATRIUM             Index LA diam:        2.90 cm 1.38 cm/m LA Vol (A2C):   55.0 ml 26.25 ml/m LA Vol (A4C):   40.2 ml 19.17 ml/m LA Biplane Vol: 53.2 ml 25.37 ml/m  AORTIC VALVE  PULMONIC VALVE AV Area (Vmax):    2.96 cm     PV Vmax:       1.03 m/s AV Area (Vmean):   3.00 cm     PV Peak grad:  4.3 mmHg AV Area (VTI):     2.91 cm AV Vmax:           118.00 cm/s AV Vmean:          74.900 cm/s AV VTI:            0.201 m AV Peak Grad:      5.6 mmHg AV Mean Grad:      3.0 mmHg LVOT Vmax:         111.00 cm/s LVOT Vmean:        71.600 cm/s LVOT VTI:          0.186 m LVOT/AV VTI ratio: 0.93  AORTA Ao Root diam: 3.60 cm MITRAL VALVE MV Area (PHT): 3.36 cm     SHUNTS MV Decel Time: 226 msec     Systemic VTI:  0.19 m MV E velocity: 78.80 cm/s   Systemic Diam: 2.00 cm MV A velocity: 111.00 cm/s MV E/A ratio:  0.71 Candee Furbish MD Electronically signed by Candee Furbish MD Signature Date/Time: 04/03/2021/12:47:19 PM    Final     Medications: I have reviewed the patient's current medications.  Assessment/Plan: 1) Coffee ground emesis/solid food dysphagia with abnormal CT scan showing thickening of the distal esophagus-EGD now planned for tomorrow. On IV PPI's. Hemoglobin 12-10.7 gms/dl today. 2) Personal history of colon cancer s/p left hemicolectomy and chemo. 3) Severe sepsis-no source identified. 4) AKI on CKDStage III A. 5) IDDM. 6) HTN. 7) New T wave inversions-elevated troponins ?demand ischemia. 8) Chronic diastolic CHF.  LOS: 2 days   Juanita Craver 04/04/2021, 4:26 PM

## 2021-04-05 ENCOUNTER — Encounter (HOSPITAL_COMMUNITY)
Admission: EM | Disposition: A | Discharge status: Home Health | Payer: Self-pay | Source: Home / Self Care | Attending: Internal Medicine

## 2021-04-05 ENCOUNTER — Inpatient Hospital Stay: Payer: 59 | Admitting: Nurse Practitioner

## 2021-04-05 ENCOUNTER — Inpatient Hospital Stay (HOSPITAL_COMMUNITY): Payer: 59 | Admitting: Certified Registered Nurse Anesthetist

## 2021-04-05 ENCOUNTER — Encounter (HOSPITAL_COMMUNITY): Payer: Self-pay | Admitting: Internal Medicine

## 2021-04-05 HISTORY — PX: ESOPHAGOGASTRODUODENOSCOPY: SHX5428

## 2021-04-05 LAB — BASIC METABOLIC PANEL
Anion gap: 9 (ref 5–15)
BUN: 35 mg/dL — ABNORMAL HIGH (ref 6–20)
CO2: 23 mmol/L (ref 22–32)
Calcium: 8 mg/dL — ABNORMAL LOW (ref 8.9–10.3)
Chloride: 102 mmol/L (ref 98–111)
Creatinine, Ser: 1.73 mg/dL — ABNORMAL HIGH (ref 0.61–1.24)
GFR, Estimated: 45 mL/min — ABNORMAL LOW (ref >60)
Glucose, Bld: 150 mg/dL — ABNORMAL HIGH (ref 70–99)
Potassium: 3.1 mmol/L — ABNORMAL LOW (ref 3.5–5.1)
Sodium: 134 mmol/L — ABNORMAL LOW (ref 135–145)

## 2021-04-05 LAB — GLUCOSE, CAPILLARY
Glucose-Capillary: 191 mg/dL — ABNORMAL HIGH (ref 70–99)
Glucose-Capillary: 129 mg/dL — ABNORMAL HIGH (ref 70–99)
Glucose-Capillary: 120 mg/dL — ABNORMAL HIGH (ref 70–99)
Glucose-Capillary: 138 mg/dL — ABNORMAL HIGH (ref 70–99)
Glucose-Capillary: 178 mg/dL — ABNORMAL HIGH (ref 70–99)

## 2021-04-05 SURGERY — EGD (ESOPHAGOGASTRODUODENOSCOPY)
Anesthesia: Monitor Anesthesia Care | Laterality: Left

## 2021-04-05 MED ORDER — PROPOFOL 500 MG/50ML IV EMUL
INTRAVENOUS | Status: DC | PRN
  Administered 2021-04-05: 125 ug/kg/min via INTRAVENOUS

## 2021-04-05 MED ORDER — SODIUM CHLORIDE 0.9 % IV SOLN
2.0000 g | Freq: Two times a day (BID) | INTRAVENOUS | Status: AC
  Administered 2021-04-05: 2 g via INTRAVENOUS
  Filled 2021-04-05: qty 2

## 2021-04-05 MED ORDER — CARVEDILOL 3.125 MG PO TABS
3.1250 mg | ORAL_TABLET | Freq: Two times a day (BID) | ORAL | Status: DC
  Administered 2021-04-05 – 2021-04-15 (×19): 3.125 mg via ORAL
  Filled 2021-04-05 (×20): qty 1

## 2021-04-05 MED ORDER — CARVEDILOL 3.125 MG PO TABS: 3.1250 mg | ORAL_TABLET | Freq: Two times a day (BID) | ORAL | Status: DC

## 2021-04-05 MED ORDER — PROPOFOL 10 MG/ML IV BOLUS
INTRAVENOUS | Status: DC | PRN
  Administered 2021-04-05 (×3): 20 mg via INTRAVENOUS

## 2021-04-05 MED ORDER — BACLOFEN 10 MG PO TABS
10.0000 mg | ORAL_TABLET | Freq: Three times a day (TID) | ORAL | Status: DC
  Administered 2021-04-05 – 2021-04-15 (×29): 10 mg via ORAL
  Filled 2021-04-05 (×30): qty 1

## 2021-04-05 MED ORDER — METRONIDAZOLE 500 MG/100ML IV SOLN
500.0000 mg | Freq: Two times a day (BID) | INTRAVENOUS | Status: AC
  Administered 2021-04-05: 500 mg via INTRAVENOUS
  Filled 2021-04-05: qty 100

## 2021-04-05 MED ORDER — SUCRALFATE 1 GM/10ML PO SUSP
1.0000 g | Freq: Three times a day (TID) | ORAL | Status: DC
  Administered 2021-04-05 – 2021-04-13 (×20): 1 g via ORAL
  Filled 2021-04-05 (×27): qty 10

## 2021-04-05 MED ORDER — LIDOCAINE 2% (20 MG/ML) 5 ML SYRINGE
INTRAMUSCULAR | Status: DC | PRN
  Administered 2021-04-05: 80 mg via INTRAVENOUS

## 2021-04-05 MED ORDER — LACTATED RINGERS IV SOLN: INTRAVENOUS | Status: DC

## 2021-04-05 MED ORDER — AMLODIPINE BESYLATE 5 MG PO TABS
5.0000 mg | ORAL_TABLET | Freq: Every day | ORAL | Status: DC
  Administered 2021-04-06 – 2021-04-15 (×9): 5 mg via ORAL
  Filled 2021-04-05 (×10): qty 1

## 2021-04-05 NOTE — Anesthesia Postprocedure Evaluation (Signed)
Anesthesia Post Note  Patient: Faron Gaccione  Procedure(s) Performed: ESOPHAGOGASTRODUODENOSCOPY (EGD) (Left)     Patient location during evaluation: Endoscopy Anesthesia Type: MAC Level of consciousness: awake and alert Pain management: pain level controlled Vital Signs Assessment: post-procedure vital signs reviewed and stable Respiratory status: spontaneous breathing, nonlabored ventilation, respiratory function stable and patient connected to nasal cannula oxygen Cardiovascular status: stable and blood pressure returned to baseline Postop Assessment: no apparent nausea or vomiting Anesthetic complications: no   No notable events documented.  Last Vitals:  Vitals:   04/05/21 1430 04/05/21 1440  BP: (!) 144/76 (!) 162/84  Pulse: 78 75  Resp: 19 20  Temp:    SpO2: 100% 99%    Last Pain:  Vitals:   04/05/21 1440  TempSrc:   PainSc: 0-No pain                 Nadav Swindell P Ramelo Oetken     

## 2021-04-05 NOTE — Anesthesia Preprocedure Evaluation (Addendum)
Anesthesia Evaluation  Patient identified by MRN, date of birth, ID band Patient awake    Reviewed: Allergy & Precautions, NPO status , Patient's Chart, lab work & pertinent test results  Airway Mallampati: II  TM Distance: >3 FB     Dental no notable dental hx.    Pulmonary shortness of breath and with exertion,    Pulmonary exam normal        Cardiovascular hypertension, Pt. on medications and Pt. on home beta blockers +CHF   Rhythm:Regular Rate:Normal     Neuro/Psych    GI/Hepatic Neg liver ROS, GERD  Medicated,GIB   Endo/Other  diabetes, Poorly Controlled, Type 2, Insulin Dependent  Renal/GU CRFRenal disease  negative genitourinary   Musculoskeletal  (+) Arthritis , Osteoarthritis,    Abdominal Normal abdominal exam  (+)   Peds  Hematology  (+) anemia ,   Anesthesia Other Findings   Reproductive/Obstetrics                           Anesthesia Physical Anesthesia Plan  ASA: 3  Anesthesia Plan: MAC   Post-op Pain Management:    Induction: Intravenous  PONV Risk Score and Plan: 1 and Propofol infusion and Treatment may vary due to age or medical condition  Airway Management Planned: Simple Face Mask, Natural Airway and Nasal Cannula  Additional Equipment: None  Intra-op Plan:   Post-operative Plan:   Informed Consent: I have reviewed the patients History and Physical, chart, labs and discussed the procedure including the risks, benefits and alternatives for the proposed anesthesia with the patient or authorized representative who has indicated his/her understanding and acceptance.     Dental advisory given  Plan Discussed with: CRNA  Anesthesia Plan Comments: (ECHO 04/03/21: 1. Left ventricular ejection fraction, by estimation, is 60 to 65%. The  left ventricle has normal function. The left ventricle has no regional  wall motion abnormalities. There is mild left  ventricular hypertrophy.  Left ventricular diastolic parameters  are consistent with Grade I diastolic dysfunction (impaired relaxation).  2. Right ventricular systolic function is normal. The right ventricular  size is normal.  3. The mitral valve is normal in structure. No evidence of mitral valve  regurgitation. No evidence of mitral stenosis.  4. The aortic valve is normal in structure. Aortic valve regurgitation is  not visualized. No aortic stenosis is present.  5. The inferior vena cava is normal in size with greater than 50%  respiratory variability, suggesting right atrial pressure of 3 mmHg.   Comparison(s): Prior images unable to be directly viewed, comparison made  by report only. Changes from prior study are noted.   Lab Results      Component                Value               Date                      WBC                      8.3                 04/04/2021                HGB                      10.7 (L)  04/04/2021                HCT                      31.6 (L)            04/04/2021                MCV                      94.6                04/04/2021                PLT                      173                 04/04/2021           Lab Results      Component                Value               Date                      NA                       136                 04/04/2021                K                        3.2 (L)             04/04/2021                CO2                      26                  04/04/2021                GLUCOSE                  117 (H)             04/04/2021                BUN                      34 (H)              04/04/2021                CREATININE               1.80 (H)            04/04/2021                CALCIUM                  8.3 (L)             04/04/2021                GFRNONAA  43 (L)              04/04/2021          )      Anesthesia Quick Evaluation

## 2021-04-05 NOTE — Progress Notes (Signed)
Inpatient Diabetes Program Recommendations  AACE/ADA: New Consensus Statement on Inpatient Glycemic Control (2015)  Target Ranges:  Prepandial:   less than 140 mg/dL      Peak postprandial:   less than 180 mg/dL (1-2 hours)      Critically ill patients:  140 - 180 mg/dL   Lab Results  Component Value Date   GLUCAP 129 (H) 04/05/2021   HGBA1C 11.4 (H) 04/02/2021   Review of Glycemic Control Results for Jeffrey Young, Jeffrey Young (MRN [id number]) as of 04/05/2021 11:04  Ref. Range 01/23/2018 02:39 07/25/2020 10:44 07/29/2020 05:35 04/02/2021 05:58  Hemoglobin A1C Latest Ref Range: 4.8 - 5.6 % 7.2 (H) 7.2 (H) 6.7 (H) 11.4 (H)    Results for [name], CUTTING (MRN [id number]) as of 04/05/2021 11:04  Ref. Range 04/04/2021 07:32 04/04/2021 12:01 04/04/2021 16:16 04/04/2021 19:26 04/04/2021 23:18 04/05/2021 03:49 04/05/2021 07:28  Glucose-Capillary Latest Ref Range: 70 - 99 mg/dL 116 (H) 124 (H) 107 (H) 126 (H) 118 (H) 191 (H) 129 (H)   Diabetes history: DM 2 Outpatient Diabetes medications: Glargine-Lixisenatide combo 10 units qam and 15 units qpm Current orders for Inpatient glycemic control:  Novolog 0-9 units Q4 hours  Boost breeze tide between meals Note Decadron with Chemotherapy outpt  Attempted to see pt in regards to A1c of 11.4%. Pt recently on decadron for chemotherapy. On insulin at home. Pt was resting at this time. Pt still to be inpatient, will see at a later time.  Thanks,  Tama Headings RN, MSN, BC-ADM Inpatient Diabetes Coordinator Team Pager [phone] (8a-5p)

## 2021-04-05 NOTE — Interval H&P Note (Signed)
History and Physical Interval Note:  04/05/2021 1:42 PM  Jeffrey Young  has presented today for surgery, with the diagnosis of Coffee-ground emesis, esophagitis.  The various methods of treatment have been discussed with the patient and family. After consideration of risks, benefits and other options for treatment, the patient has consented to  Procedure(s): ESOPHAGOGASTRODUODENOSCOPY (EGD) (Left) as a surgical intervention.  The patient's history has been reviewed, patient examined, no change in status, stable for surgery.  I have reviewed the patient's chart and labs.  Questions were answered to the patient's satisfaction.     Jeffrey Young D

## 2021-04-05 NOTE — Op Note (Addendum)
[facility] Patient Name: Jeffrey Young Procedure Date: 04/05/2021 MRN: [id number] Attending MD: Carol Ada , MD Date of Birth: 1966/04/16 CSN: [id number] Age: 55 Admit Type: Inpatient Procedure:                Upper GI endoscopy Indications:              Coffee-ground emesis, Nausea with vomiting Providers:                Carol Ada, MD, Particia Nearing, RN, Broder Richards,                            Technician, [facility], CRNA Referring MD:              Medicines:                Propofol per Anesthesia Complications:            No immediate complications. Estimated Blood Loss:     Estimated blood loss: none. Procedure:                Pre-Anesthesia Assessment:                           - Prior to the procedure, a History and Physical                            was performed, and patient medications and                            allergies were reviewed. The patient's tolerance of                            previous anesthesia was also reviewed. The risks                            and benefits of the procedure and the sedation                            options and risks were discussed with the patient.                            All questions were answered, and informed consent                            was obtained. Prior Anticoagulants: The patient has                            taken no previous anticoagulant or antiplatelet                            agents. ASA Grade Assessment: III - A patient with                            severe systemic disease. After reviewing the risks  and benefits, the patient was deemed in                            satisfactory condition to undergo the procedure.                           - Sedation was administered by an anesthesia                            professional. Deep sedation was attained.                           After obtaining informed consent, the endoscope was                             passed under direct vision. Throughout the                            procedure, the patient's blood pressure, pulse, and                            oxygen saturations were monitored continuously. The                            [id number]-[id number] ([id number] Olympus endoscope was introduced                            through the mouth, and advanced to the second part                            of duodenum. The upper GI endoscopy was                            accomplished without difficulty. The patient                            tolerated the procedure well. Scope In: Scope Out: Findings:      LA Grade D (one or more mucosal breaks involving at least 75% of       esophageal circumference) esophagitis with no bleeding was found in the       lower third of the esophagus.      The gastroesophageal flap valve was visualized endoscopically and       classified as Hill Grade I (prominent fold, tight to endoscope).      The stomach was normal.      The examined duodenum was normal.      An LA Grade D esophagitis was again identified. This was felt to be       reflux-induced. The GE junction was competent and he had a Hill Grade I.       In this instance he may benefit with using Baclofen for TLESR. Impression:               - LA Grade D reflux esophagitis with no bleeding.                           -  Gastroesophageal flap valve classified as Hill                            Grade I (prominent fold, tight to endoscope).                           - Normal stomach.                           - Normal examined duodenum.                           - No specimens collected. Moderate Sedation:      Not Applicable - Patient had care per Anesthesia. Recommendation:           - Return patient to hospital ward for ongoing care.                           - Resume regular diet.                           - Continue present medications.                           - Trial of Baclofen 10 m TID.                            - Sucralfate 1 gram TID. Procedure Code(s):        --- Professional ---                           [phone], Esophagogastroduodenoscopy, flexible,                            transoral; diagnostic, including collection of                            specimen(s) by brushing or washing, when performed                            (separate procedure) Diagnosis Code(s):        --- Professional ---                           K21.00, Gastro-esophageal reflux disease with                            esophagitis, without bleeding                           K92.0, Hematemesis                           R11.2, Nausea with vomiting, unspecified CPT copyright 2019 [employer]. All rights reserved. The codes documented in this report are preliminary and upon coder review may  be revised to meet current compliance requirements. Carol Ada, MD Carol Ada, MD 04/05/2021 2:20:53 PM This report has been  signed electronically. Number of Addenda: 0

## 2021-04-05 NOTE — Anesthesia Postprocedure Evaluation (Signed)
Anesthesia Post Note  Patient: Jeffrey Young  Procedure(s) Performed: ESOPHAGOGASTRODUODENOSCOPY (EGD) (Left)     Patient location during evaluation: Endoscopy Anesthesia Type: MAC Level of consciousness: awake and alert Pain management: pain level controlled Vital Signs Assessment: post-procedure vital signs reviewed and stable Respiratory status: spontaneous breathing, nonlabored ventilation, respiratory function stable and patient connected to nasal cannula oxygen Cardiovascular status: stable and blood pressure returned to baseline Postop Assessment: no apparent nausea or vomiting Anesthetic complications: no   No notable events documented.  Last Vitals:  Vitals:   04/05/21 1430 04/05/21 1440  BP: (!) 144/76 (!) 162/84  Pulse: 78 75  Resp: 19 20  Temp:    SpO2: 100% 99%    Last Pain:  Vitals:   04/05/21 1440  TempSrc:   PainSc: 0-No pain                 Belenda Cruise P Allannah Kempen

## 2021-04-05 NOTE — Progress Notes (Signed)
PROGRESS NOTE  Jeffrey Young TDH:[id number] DOB: 06-26-1965 DOA: 04/01/2021 PCP: [name], MD   LOS: 3 days   Brief Narrative / Interim history: 55 year old male with history of colon cancer, IDDM, HTN, HLD, chronic diastolic CHF, chronic kidney disease stage IIIa comes to the hospital with coffee-ground emesis and confusion.  He was febrile and mildly tachycardic on admission, had leukocytosis with a white count of 15.4.  Work-up in the ED did not show any obvious infectious source, urinalysis was clean, chest x-ray was negative for acute findings, UDS negative, but a CT scan of the abdomen pelvis showed small hiatal hernia with diffuse thickening of the mid to distal esophagus which may be infectious versus inflammatory.  Head CT was negative for acute findings.  He was lethargic, confused and was admitted to the hospital.  He was placed on broad-spectrum IV antibiotics  Subjective / 24h Interval events: Doing well, alert, appropriate, continues to complain of ongoing hiccups and nausea  Assessment & Plan: Principal Problem Upper GI bleed/esophagitis -Patient here for evaluation of coffee-ground emesis.  Hemoglobin stable. Gastric occult positive.  FOBT negative. CT showing small hiatal hernia and findings consistent with esophagitis.  Followed by Dr. Benson Norway and had EGD done in May 2022 which revealed LA grade D reflux esophagitis.   -Continue PPI, consulted gastroenterology, he is to have EGD today  Active Problems SIRS -On admission he had fever, tachycardia, leukocytosis, and lactic acidosis.  COVID and influenza PCR negative.  UA without signs of infection.  Chest x-ray not suggestive of pneumonia.  No meningeal signs.  CT showing infectious versus inflammatory esophagitis.  Liver enzymes mildly elevated (cholestatic pattern).  Right upper quadrant ultrasound without acute findings -He has been empirically placed on broad-spectrum antibiotics, cultures are negative, will stop  antibiotics after today's evening dose and that would give him 5 days.  No clear source but awaiting EGD as above   AKI on CKD stage IIIa- Likely prerenal azotemia from dehydration/sepsis.  Baseline creatinine 1.3-1.5, on admission 2.1.  Creatinine stabilized around 1.8   Acute metabolic encephalopathy - Likely due to sepsis/infection and AKI.  Head CT negative for acute finding.  Neuro exam nonfocal.  No meningeal signs.  No elevation of ammonia.  UDS negative.  Remains alert today and appears back to baseline   Mild hypokalemia -continue to monitor potassium levels   New T wave inversions, elevated troponin - EKG showing T wave inversions in lateral leads, new since prior tracing from April 2022.  No chest pain.  Troponins overall flat, not in a pattern consistent with ACS -Troponins flat, likely demand ischemia, no anginal symptoms.  2D echo reassuring, normal EF, no wall motion abnormalities and grade 1 diastolic dysfunction.     Colon cancer -Followed by Dr. Blair Promise and finished last cycle of chemotherapy in September 2022. Outpatient oncology follow-up.  He is supposed to have a CT scan for restaging as an outpatient, if his kidney function is better/stable could potentially have it done prior to discharge.   Insulin-dependent type 2 diabetes with hyperglycemia, poorly controlled -A1c 11.  Continue every 4 sliding scale.   Hypertension -resume home antihypertensives, initially they were held in the setting of sepsis   Chronic diastolic CHF -Last echo done in March 2022 showing EF 55 to 60% and indeterminate diastolic parameters.  No signs of volume overload at this time.   Scheduled Meds:  Chlorhexidine Gluconate Cloth  6 each Topical Daily   feeding supplement  1 Container Oral TID BM  insulin aspart  0-9 Units Subcutaneous Q4H   multivitamin with minerals  1 tablet Oral Daily   pantoprazole (PROTONIX) IV  40 mg Intravenous Q12H   sodium chloride flush  10-40 mL Intracatheter Q12H    sodium chloride flush  10-40 mL Intracatheter Q12H   Continuous Infusions:  sodium chloride 20 mL/hr at 04/05/21 0015   ceFEPime (MAXIPIME) IV 2 g (04/04/21 2114)   metronidazole 500 mg (04/05/21 0849)   PRN Meds:.acetaminophen **OR** acetaminophen, ondansetron (ZOFRAN) IV, sodium chloride flush  Diet Orders (From admission, onward)     Start     Ordered   04/05/21 0000  Diet NPO time specified  Diet effective midnight        04/04/21 2343            DVT prophylaxis: SCDs Start: 04/02/21 0437     Code Status: Full Code  Family Communication: Wife present at bedside  Status is: Inpatient  Remains inpatient appropriate because: Persistent symptoms, IV antibiotics  Level of care: Telemetry  Consultants:  GI  Procedures:  2D echo: pending  Microbiology  none  Antimicrobials: Vanc, cefepime, metronidazole 11/12   Objective: Vitals:   04/04/21 1205 04/04/21 1933 04/04/21 2125 04/05/21 0346  BP: (!) 180/100 (!) 181/107 (!) 190/99 (!) 172/102  Pulse: 77 77 81 71  Resp: 20 19  18   Temp: 98.8 F (37.1 C) 99.3 F (37.4 C)  98.8 F (37.1 C)  TempSrc: Oral Oral  Oral  SpO2: 100% 100%  100%  Weight:      Height:        Intake/Output Summary (Last 24 hours) at 04/05/2021 1022 Last data filed at 04/05/2021 0900 Gross per 24 hour  Intake 953.99 ml  Output 1300 ml  Net -346.01 ml    Filed Weights   04/01/21 2117  Weight: 85.4 kg    Examination:  Constitutional: No distress, in bed, appears uncomfortable Eyes: No scleral icterus ENMT: Moist mucous membranes Neck: normal, supple Respiratory: Lungs are clear bilaterally, no wheezing, crackles Cardiovascular: Regular rate and rhythm, no murmurs, no peripheral edema Abdomen: Soft, nontender, nondistended, bowel sounds positive Musculoskeletal: no clubbing / cyanosis.  Skin: No rashes seen Neurologic: Nonfocal, equal strength   Data Reviewed: I have independently reviewed following labs and imaging  studies  CBC: Recent Labs  Lab 04/01/21 2130 04/02/21 0558 04/03/21 0436 04/04/21 0433  WBC 15.4* 15.6* 11.0* 8.3  NEUTROABS 12.7*  --   --   --   HGB 13.0 12.0* 11.4* 10.7*  HCT 37.6* 35.2* 33.7* 31.6*  MCV 91.0 92.4 94.9 94.6  PLT 255 233 187 300    Basic Metabolic Panel: Recent Labs  Lab 04/01/21 2130 04/02/21 0558 04/03/21 0436 04/04/21 0433  NA 133* 134* 138 136  K 3.4* 3.3* 3.1* 3.2*  CL 94* 96* 102 102  CO2 28 29 26 26   GLUCOSE 324* 150* 127* 117*  BUN 26* 27* 29* 34*  CREATININE 2.18* 2.09* 1.92* 1.80*  CALCIUM 9.4 9.3 8.7* 8.3*  MG  --  1.9 1.8  --     Liver Function Tests: Recent Labs  Lab 04/01/21 2130 04/03/21 0436  AST 19 17  ALT 10 9  ALKPHOS 169* 113  BILITOT 1.7* 1.9*  PROT 7.6 6.2*  ALBUMIN 3.4* 2.6*    Coagulation Profile: Recent Labs  Lab 04/01/21 2130  INR 0.9    HbA1C: No results for input(s): HGBA1C in the last 72 hours.  CBG: Recent Labs  Lab 04/04/21 1616 04/04/21  1926 04/04/21 2318 04/05/21 0349 04/05/21 0728  GLUCAP 107* 126* 118* 191* 129*     Recent Results (from the past 240 hour(s))  Resp Panel by RT-PCR (Flu A&B, Covid) Nasopharyngeal Swab     Status: None   Collection Time: 04/01/21  9:39 PM   Specimen: Nasopharyngeal Swab; Nasopharyngeal(NP) swabs in vial transport medium  Result Value Ref Range Status   SARS Coronavirus 2 by RT PCR NEGATIVE NEGATIVE Final    Comment: (NOTE) SARS-CoV-2 target nucleic acids are NOT DETECTED.  The SARS-CoV-2 RNA is generally detectable in upper respiratory specimens during the acute phase of infection. The lowest concentration of SARS-CoV-2 viral copies this assay can detect is 138 copies/mL. A negative result does not preclude SARS-Cov-2 infection and should not be used as the sole basis for treatment or other patient management decisions. A negative result may occur with  improper specimen collection/handling, submission of specimen other than nasopharyngeal swab,  presence of viral mutation(s) within the areas targeted by this assay, and inadequate number of viral copies(<138 copies/mL). A negative result must be combined with clinical observations, patient history, and epidemiological information. The expected result is Negative.  Fact Sheet for Patients:  EntrepreneurPulse.com.au  Fact Sheet for Healthcare Providers:  IncredibleEmployment.be  This test is no t yet approved or cleared by the [place] FDA and  has been authorized for detection and/or diagnosis of SARS-CoV-2 by FDA under an Emergency Use Authorization (EUA). This EUA will remain  in effect (meaning this test can be used) for the duration of the COVID-19 declaration under Section 564(b)(1) of the Act, 21 U.S.C.section 360bbb-3(b)(1), unless the authorization is terminated  or revoked sooner.       Influenza A by PCR NEGATIVE NEGATIVE Final   Influenza B by PCR NEGATIVE NEGATIVE Final    Comment: (NOTE) The Xpert Xpress SARS-CoV-2/FLU/RSV plus assay is intended as an aid in the diagnosis of influenza from Nasopharyngeal swab specimens and should not be used as a sole basis for treatment. Nasal washings and aspirates are unacceptable for Xpert Xpress SARS-CoV-2/FLU/RSV testing.  Fact Sheet for Patients: EntrepreneurPulse.com.au  Fact Sheet for Healthcare Providers: IncredibleEmployment.be  This test is not yet approved or cleared by the [place] FDA and has been authorized for detection and/or diagnosis of SARS-CoV-2 by FDA under an Emergency Use Authorization (EUA). This EUA will remain in effect (meaning this test can be used) for the duration of the COVID-19 declaration under Section 564(b)(1) of the Act, 21 U.S.C. section 360bbb-3(b)(1), unless the authorization is terminated or revoked.  Performed at [facility], [place] [address], [place], [place] [address]   Blood  Culture (routine x 2)     Status: None (Preliminary result)   Collection Time: 04/01/21  9:48 PM   Specimen: BLOOD  Result Value Ref Range Status   Specimen Description   Final    BLOOD LEFT ANTECUBITAL Performed at [facility] [address], [place], [place] [address]    Special Requests   Final    BOTTLES DRAWN AEROBIC AND ANAEROBIC Blood Culture results may not be optimal due to an excessive volume of blood received in culture bottles Performed at [facility] [address] [place], [place] [address]    Culture   Final    NO GROWTH 2 DAYS Performed at [facility] [address] [place], [place] [address]    Report Status PENDING  Incomplete  Blood Culture (routine x 2)  Status: None (Preliminary result)   Collection Time: 04/01/21 10:50 PM   Specimen: BLOOD  Result Value Ref Range Status   Specimen Description   Final    BLOOD BLOOD LEFT FOREARM Performed at [facility] [address] [place], [place] [address]    Special Requests   Final    BOTTLES DRAWN AEROBIC AND ANAEROBIC Blood Culture results may not be optimal due to an excessive volume of blood received in culture bottles Performed at [facility] [address] [place] [address], [place], [place] [address]    Culture   Final    NO GROWTH 2 DAYS Performed at [facility] [address], [place], [place] [address]    Report Status PENDING  Incomplete  Urine Culture     Status: None   Collection Time: 04/01/21 11:40 PM   Specimen: In/Out Cath Urine  Result Value Ref Range Status   Specimen Description   Final    IN/OUT CATH URINE Performed at [facility] [address], [place], [place] [address]    Special Requests   Final    NONE Performed at [facility], [place] [address], [place], [place] [address]    Culture   Final    NO GROWTH Performed at [facility], [place] [address],  [place],  [address]    Report Status 04/03/2021 FINAL  Final      Radiology Studies: No results found.   Marzetta Board, MD, PhD [facility]  Between 7 am - 7 pm I am available, please contact me via Amion (for emergencies) or Securechat (non urgent messages)  Between 7 pm - 7 am I am not available, please contact night coverage MD/APP via Amion

## 2021-04-05 NOTE — Transfer of Care (Signed)
Immediate Anesthesia Transfer of Care Note  Patient: Jeffrey Young  Procedure(s) Performed: ESOPHAGOGASTRODUODENOSCOPY (EGD) (Left)  Patient Location: PACU  Anesthesia Type:MAC  Level of Consciousness: awake, alert  and oriented  Airway & Oxygen Therapy: Patient Spontanous Breathing and Patient connected to face mask oxygen  Post-op Assessment: Report given to RN and Post -op Vital signs reviewed and stable  Post vital signs: Reviewed and stable  Last Vitals:  Vitals Value Taken Time  BP 113/62 04/05/21 1415  Temp    Pulse 77 04/05/21 1416  Resp 26 04/05/21 1416  SpO2 100 % 04/05/21 1416  Vitals shown include unvalidated device data.  Last Pain:  Vitals:   04/05/21 1331  TempSrc: Oral  PainSc: 0-No pain      Patients Stated Pain Goal: 0 (46/27/03 5009)  Complications: No notable events documented.

## 2021-04-05 NOTE — Plan of Care (Signed)
  Problem: Coping: Goal: Level of anxiety will decrease Outcome: Progressing   Problem: Elimination: Goal: Will not experience complications related to bowel motility Outcome: Progressing   Problem: Pain Managment: Goal: General experience of comfort will improve Outcome: Progressing   Problem: Safety: Goal: Ability to remain free from injury will improve Outcome: Progressing   

## 2021-04-06 ENCOUNTER — Encounter (HOSPITAL_COMMUNITY): Payer: Self-pay | Admitting: Gastroenterology

## 2021-04-06 LAB — CBC
HCT: 27 % — ABNORMAL LOW (ref 39.0–52.0)
Hemoglobin: 9 g/dL — ABNORMAL LOW (ref 13.0–17.0)
MCH: 31.7 pg (ref 26.0–34.0)
MCHC: 33.3 g/dL (ref 30.0–36.0)
MCV: 95.1 fL (ref 80.0–100.0)
Platelets: 168 10*3/uL (ref 150–400)
RBC: 2.84 MIL/uL — ABNORMAL LOW (ref 4.22–5.81)
RDW: 12.3 % (ref 11.5–15.5)
WBC: 6.2 10*3/uL (ref 4.0–10.5)
nRBC: 0 % (ref 0.0–0.2)

## 2021-04-06 LAB — BASIC METABOLIC PANEL
Anion gap: 7 (ref 5–15)
BUN: 32 mg/dL — ABNORMAL HIGH (ref 6–20)
CO2: 24 mmol/L (ref 22–32)
Calcium: 7.9 mg/dL — ABNORMAL LOW (ref 8.9–10.3)
Chloride: 100 mmol/L (ref 98–111)
Creatinine, Ser: 1.52 mg/dL — ABNORMAL HIGH (ref 0.61–1.24)
GFR, Estimated: 53 mL/min — ABNORMAL LOW (ref >60)
Glucose, Bld: 159 mg/dL — ABNORMAL HIGH (ref 70–99)
Potassium: 2.8 mmol/L — ABNORMAL LOW (ref 3.5–5.1)
Sodium: 131 mmol/L — ABNORMAL LOW (ref 135–145)

## 2021-04-06 LAB — GLUCOSE, CAPILLARY
Glucose-Capillary: 121 mg/dL — ABNORMAL HIGH (ref 70–99)
Glucose-Capillary: 131 mg/dL — ABNORMAL HIGH (ref 70–99)
Glucose-Capillary: 149 mg/dL — ABNORMAL HIGH (ref 70–99)
Glucose-Capillary: 163 mg/dL — ABNORMAL HIGH (ref 70–99)
Glucose-Capillary: 180 mg/dL — ABNORMAL HIGH (ref 70–99)
Glucose-Capillary: 180 mg/dL — ABNORMAL HIGH (ref 70–99)

## 2021-04-06 LAB — MAGNESIUM: Magnesium: 1.8 mg/dL (ref 1.7–2.4)

## 2021-04-06 LAB — PHOSPHORUS: Phosphorus: 3 mg/dL (ref 2.5–4.6)

## 2021-04-06 MED ORDER — IPRATROPIUM-ALBUTEROL 0.5-2.5 (3) MG/3ML IN SOLN: 3.0000 mL | RESPIRATORY_TRACT | Status: DC | PRN

## 2021-04-06 MED ORDER — TRAZODONE HCL 50 MG PO TABS: 50.0000 mg | ORAL_TABLET | Freq: Every evening | ORAL | Status: DC | PRN

## 2021-04-06 MED ORDER — SENNOSIDES-DOCUSATE SODIUM 8.6-50 MG PO TABS: 1.0000 | ORAL_TABLET | Freq: Every evening | ORAL | Status: DC | PRN

## 2021-04-06 MED ORDER — PANTOPRAZOLE SODIUM 40 MG PO TBEC
40.0000 mg | DELAYED_RELEASE_TABLET | Freq: Two times a day (BID) | ORAL | Status: DC
  Administered 2021-04-06 – 2021-04-15 (×17): 40 mg via ORAL
  Filled 2021-04-06 (×18): qty 1

## 2021-04-06 MED ORDER — HYDRALAZINE HCL 20 MG/ML IJ SOLN
10.0000 mg | INTRAMUSCULAR | Status: DC | PRN
  Administered 2021-04-06: 10 mg via INTRAVENOUS
  Filled 2021-04-06: qty 1

## 2021-04-06 MED ORDER — METOPROLOL TARTRATE 5 MG/5ML IV SOLN
5.0000 mg | INTRAVENOUS | Status: DC | PRN
  Filled 2021-04-06: qty 5

## 2021-04-06 MED ORDER — POTASSIUM CHLORIDE 10 MEQ/100ML IV SOLN
10.0000 meq | INTRAVENOUS | Status: AC
  Administered 2021-04-06 (×6): 10 meq via INTRAVENOUS
  Filled 2021-04-06 (×6): qty 100

## 2021-04-06 NOTE — Progress Notes (Addendum)
Subjective: Since I last evaluated the patient, he was to be doing well.  The nurse tells me he tried to eat a muffin today which caused him to have some nausea but he was able to keep it down.  The patient is asleep at the present time and I was unable to wake him up to get some history from him and therefore I got most of the history from the nurse.  Objective: Vital signs in last 24 hours: Temp:  [98.7 F (37.1 C)-99.1 F (37.3 C)] 98.7 F (37.1 C) (11/16 1340) Pulse Rate:  [66-80] 66 (11/16 1340) Resp:  [17-20] 20 (11/16 1340) BP: (152-188)/(78-98) 152/78 (11/16 1340) SpO2:  [100 %] 100 % (11/16 1340) Last BM Date: 03/31/21  Intake/Output from previous day: 11/15 0701 - 11/16 0700 In: 240.3 [I.V.:240.3] Out: 600 [Urine:600] Intake/Output this shift: No intake/output data recorded.  GPE not done as the patient was asleep.  Lab Results: Recent Labs    04/04/21 0433 04/06/21 0542  WBC 8.3 6.2  HGB 10.7* 9.0*  HCT 31.6* 27.0*  PLT 173 168   BMET Recent Labs    04/04/21 0433 04/05/21 1559 04/06/21 0542  NA 136 134* 131*  K 3.2* 3.1* 2.8*  CL 102 102 100  CO2 26 23 24   GLUCOSE 117* 150* 159*  BUN 34* 35* 32*  CREATININE 1.80* 1.73* 1.52*  CALCIUM 8.3* 8.0* 7.9*   Medications: I have reviewed the patient's current medications. Prior to Admission:  Medications Prior to Admission  Medication Sig Dispense Refill Last Dose   atorvastatin (LIPITOR) 10 MG tablet Take 10 mg by mouth at bedtime.   unknown   carvedilol (COREG) 3.125 MG tablet TAKE 1 TABLET BY MOUTH 2 TIMES DAILY WITH A MEAL (Patient taking differently: Take 3.125 mg by mouth 2 (two) times daily with a meal.) 180 tablet 1 unknown   dexamethasone (DECADRON) 4 MG tablet Take 1 tablet (4 mg total) by mouth 2 (two) times daily. Take for 2 days after each chemotherapy treatment. Start on day 3 of each cycle 24 tablet 0 Past Month   Insulin Glargine-Lixisenatide 100-33 UNT-MCG/ML SOPN Inject 10-15 Units into the  skin See admin instructions. Inject 10 units into the skin in the morning and 15 units in the evening   04/01/2021 at 1830   KLOR-CON M20 20 MEQ tablet TAKE 1 TABLET BY MOUTH EVERY DAY (Patient taking differently: Take 20 mEq by mouth daily.) 90 tablet 0 unknown   lidocaine-prilocaine (EMLA) cream Apply 1 application topically as directed. Apply to port site 1-2 hours prior to stick and cover with plastic wrap to numb site 30 g 2 Past Month   LORazepam (ATIVAN) 0.5 MG tablet Take 1 tablet (0.5 mg total) by mouth every 8 (eight) hours as needed (nausea). 20 tablet 0 04/01/2021   ondansetron (ZOFRAN) 8 MG tablet Take 1 tablet (8 mg total) by mouth every 8 (eight) hours as needed for nausea or vomiting. Start 72 hours after IV chemotherapy treatment 30 tablet 1 Past Month   pantoprazole (PROTONIX) 40 MG tablet Take 40 mg by mouth 2 (two) times daily.   unknown   polycarbophil (FIBERCON) 625 MG tablet Take 1 tablet (625 mg total) by mouth 2 (two) times daily. (Patient taking differently: Take 625 mg by mouth daily.) 60 tablet 0 unknown   prochlorperazine (COMPAZINE) 10 MG tablet Take 1 tablet (10 mg total) by mouth every 6 (six) hours as needed for nausea or vomiting. 60 tablet 1 unknown  ACCU-CHEK GUIDE test strip USE AS INSTRUCTED TO CHECK BLOOD SUGAR 2 TIMES DAILY      amLODipine (NORVASC) 5 MG tablet Take 1 tablet (5 mg total) by mouth daily. 30 tablet 0 unknown   HYDROcodone-acetaminophen (NORCO/VICODIN) 5-325 MG tablet Take 1 tablet by mouth every 6 (six) hours as needed for moderate pain. (Patient not taking: No sig reported) 15 tablet 0 Not Taking   Scheduled:  amLODipine  5 mg Oral Daily   baclofen  10 mg Oral TID   carvedilol  3.125 mg Oral BID WC   Chlorhexidine Gluconate Cloth  6 each Topical Daily   feeding supplement  1 Container Oral TID BM   insulin aspart  0-9 Units Subcutaneous Q4H   multivitamin with minerals  1 tablet Oral Daily   pantoprazole  40 mg Oral BID   sodium chloride  flush  10-40 mL Intracatheter Q12H   sodium chloride flush  10-40 mL Intracatheter Q12H   sucralfate  1 g Oral TID WC & HS   Continuous:  Assessment/Plan: ) Solid food dysphagia with abnormal CT scan showing thickening of the distal esophagus-EGD done yesterday showed LA grade 4 esophagitis. On PPI's. Hemoglobin 12-10.7-9.0 gms/dl today. 2) Personal history of colon cancer s/p left hemicolectomy and chemo. 3) Severe sepsis-no source identified. 4) AKI on CKDStage III A. 5) IDDM. 6) HTN. 7) New T wave inversions-no further chest pain/troponins normal. 8) Chronic diastolic CHF.  LOS: 4 days   Jeffrey Young 04/06/2021, 4:59 PM

## 2021-04-06 NOTE — Progress Notes (Signed)
Inpatient Diabetes Program Recommendations  AACE/ADA: New Consensus Statement on Inpatient Glycemic Control (2015)  Target Ranges:  Prepandial:   less than 140 mg/dL      Peak postprandial:   less than 180 mg/dL (1-2 hours)      Critically ill patients:  140 - 180 mg/dL   Lab Results  Component Value Date   GLUCAP 180 (H) 04/06/2021   HGBA1C 11.4 (H) 04/02/2021    Review of Glycemic Control Results for KAIVEN, VESTER (MRN [id number]) as of 04/05/2021 11:04   Ref. Range 01/23/2018 02:39 07/25/2020 10:44 07/29/2020 05:35 04/02/2021 05:58  Hemoglobin A1C Latest Ref Range: 4.8 - 5.6 % 7.2 (H) 7.2 (H) 6.7 (H) 11.4 (H)    Diabetes history: DM 2 Outpatient Diabetes medications: Glargine-Lixisenatide combo 10 units qam and 15 units qpm Current orders for Inpatient glycemic control:  Novolog 0-9 units Q4 hours   Boost breeze tide between meals  Spoke with pt regarding his A1c and glucose control at home. Pt has had controlled A1c's up until recently. Pt reports chemo therapy treatments and being on steroids. Pt checks his glucose at home and has noticed an increase in his levels. Pt takes his medications as prescribed. Encouraged pt to call his doctor for titration of insulin until glucose trends are in the mid 100 range. Pt has good follow up with PCP.  Thanks, Tama Headings RN, MSN, BC-ADM Inpatient Diabetes Coordinator Team Pager [phone] (8a-5p)

## 2021-04-06 NOTE — Plan of Care (Signed)
  Problem: Activity: Goal: Risk for activity intolerance will decrease Outcome: Progressing   Problem: Nutrition: Goal: Adequate nutrition will be maintained Outcome: Progressing   Problem: Elimination: Goal: Will not experience complications related to bowel motility Outcome: Progressing   

## 2021-04-06 NOTE — Progress Notes (Signed)
PROGRESS NOTE    Jeffrey Young  YYT:[id number] DOB: 1965/11/23 DOA: 04/01/2021 PCP: [name], MD   Brief Narrative:  Jeffrey Young with history of colon cancer, HTN, HLD, insulin-dependent DM2, diastolic CHF, CKD stage III yea brought to the hospital for confusion and coffee-ground emesis.  Initial work-up was unremarkable for any infection.  CT of the abdomen pelvis showed esophageal thickening concerning for infection versus inflammatory.  Head CT was negative without any meningeal signs.  He was placed on broad-spectrum antibiotics, GI was consulted.  EGD performed on 11/15 showed grade D esophagitis without evidence of active bleeding in the normal stomach.  He was started on Carafate and baclofen 3 times daily.   Assessment & Plan:   Principal Problem:   GI bleed Active Problems:   Severe sepsis (HCC)   AKI (acute kidney injury) ([facility])   Acute metabolic encephalopathy   Hypokalemia   Encephalopathy   Nausea and vomiting   Coffee ground emesis   Elevated bilirubin   Upper GI bleed/esophagitis, coffee-ground emesis -Hemoglobin remained stable.  CT showed esophagitis.  repeat endoscopy in 11/15 which also showed grade D esophagitis.  Recommended continuing PPI, added sucralfate and baclofen 3 times daily.  Regular diet.  Hypokalemia - Aggressive repletion today.  Check magnesium and phosphorus levels as well.   SIRS; Improved.  -Still no clear evidence of infection.  COVID-19/flu/UA/chest x-ray overall is negative.  Antibiotics have been discontinued.  Clinically monitor him.   AKI on CKD stage IIIa; Resolved -Baseline around 1.5.  Admission creatinine 1.9.     Acute metabolic encephalopathy, resolved -Unclear etiology.  CT head negative, neuro exam is nonfocal.  Ammonia level was unremarkable, B12 normal, TSH normal    New T wave inversions, elevated troponin - -Troponins remain flat.  Patient is chest pain-free.  Echocardiogram shows normal EF; G1DD.  We will hold  off on any further inpatient work-up   Colon cancer  -Outpatient follow-up with Dr [name].  Last chemo was September 2022.   Insulin-dependent type 2 diabetes with hyperglycemia, poorly controlled -Poorly controlled.  Hemoglobin A1c is 11.4.   Essential hypertension- -Continue Norvasc, Coreg resume home antihypertensives, initially they were held in the setting of sepsis  Will attempt to remove Foley catheter today.  DVT prophylaxis: SCDs Start: 04/02/21 0437 Code Status: Full Family Communication:    Status is: Inpatient  Remains inpatient appropriate because: Maintain hosp stay until cleared by GI and he is tolerating oral diet without any issues.  He does require aggressive electrolyte repletion today.  Hopefully discharge in next 24-48 hours.       Nutritional status  Nutrition Problem: Increased nutrient needs Etiology: cancer and cancer related treatments  Signs/Symptoms: estimated needs  Interventions: Boost Breeze, MVI  Body mass index is 24.8 kg/m.           Subjective: Sitting up in the chair, tells me he feels great.  He said he truly does not remember anything that happened over the last few days but now he feels almost back to baseline.  Review of Systems Otherwise negative except as per HPI, including: General: Denies fever, chills, night sweats or unintended weight loss. Resp: Denies cough, wheezing, shortness of breath. Cardiac: Denies chest pain, palpitations, orthopnea, paroxysmal nocturnal dyspnea. GI: Denies abdominal pain, nausea, vomiting, diarrhea or constipation GU: Denies dysuria, frequency, hesitancy or incontinence MS: Denies muscle aches, joint pain or swelling Neuro: Denies headache, neurologic deficits (focal weakness, numbness, tingling), abnormal gait Psych: Denies anxiety, depression, SI/HI/AVH Skin: Denies new  rashes or lesions ID: Denies sick contacts, exotic exposures, travel  Examination:  General exam: Appears  calm and comfortable  Respiratory system: Clear to auscultation. Respiratory effort normal. Cardiovascular system: S1 & S2 heard, RRR. No JVD, murmurs, rubs, gallops or clicks. No pedal edema. Gastrointestinal system: Abdomen is nondistended, soft and nontender. No organomegaly or masses felt. Normal bowel sounds heard. Central nervous system: Alert and oriented. No focal neurological deficits. Extremities: Symmetric 5 x 5 power. Skin: No rashes, lesions or ulcers Psychiatry: Judgement and insight appear normal. Mood & affect appropriate.  Foley catheter, will remove it today   Objective: Vitals:   04/05/21 1440 04/05/21 1750 04/05/21 2125 04/06/21 0435  BP: (!) 162/84 (!) 188/95 (!) 155/96 (!) 154/96  Pulse: 75 80 75 70  Resp: 20  19 17   Temp:   99.1 F (37.3 C) 98.9 F (37.2 C)  TempSrc:   Oral Oral  SpO2: 99%  100% 100%  Weight:      Height:        Intake/Output Summary (Last 24 hours) at 04/06/2021 0740 Last data filed at 04/06/2021 0437 Gross per 24 hour  Intake 240.29 ml  Output 600 ml  Net -359.71 ml   [facility] Weights   04/01/21 2117 04/05/21 1331  Weight: 85.4 kg 85.3 kg     Data Reviewed:   CBC: Recent Labs  Lab 04/01/21 2130 04/02/21 0558 04/03/21 0436 04/04/21 0433  WBC 15.4* 15.6* 11.0* 8.3  NEUTROABS 12.7*  --   --   --   HGB 13.0 12.0* 11.4* 10.7*  HCT 37.6* 35.2* 33.7* 31.6*  MCV 91.0 92.4 94.9 94.6  PLT 255 233 187 993   Basic Metabolic Panel: Recent Labs  Lab 04/02/21 0558 04/03/21 0436 04/04/21 0433 04/05/21 1559 04/06/21 0542  NA 134* 138 136 134* 131*  K 3.3* 3.1* 3.2* 3.1* 2.8*  CL 96* 102 102 102 100  CO2 29 26 26 23 24   GLUCOSE 150* 127* 117* 150* 159*  BUN 27* 29* 34* 35* 32*  CREATININE 2.09* 1.92* 1.80* 1.73* 1.52*  CALCIUM 9.3 8.7* 8.3* 8.0* 7.9*  MG 1.9 1.8  --   --   --    GFR: Estimated Creatinine Clearance: 60.6 mL/min (A) (by C-G formula based on SCr of 1.52 mg/dL (H)). Liver Function Tests: Recent Labs  Lab  04/01/21 2130 04/03/21 0436  AST 19 17  ALT 10 9  ALKPHOS 169* 113  BILITOT 1.7* 1.9*  PROT 7.6 6.2*  ALBUMIN 3.4* 2.6*   No results for input(s): LIPASE, AMYLASE in the last 168 hours. Recent Labs  Lab 04/01/21 2140  AMMONIA 6*   Coagulation Profile: Recent Labs  Lab 04/01/21 2130  INR 0.9   Cardiac Enzymes: No results for input(s): CKTOTAL, CKMB, CKMBINDEX, TROPONINI in the last 168 hours. BNP (last 3 results) No results for input(s): PROBNP in the last 8760 hours. HbA1C: No results for input(s): HGBA1C in the last 72 hours. CBG: Recent Labs  Lab 04/05/21 1612 04/05/21 2042 04/05/21 2359 04/06/21 0432 04/06/21 0734  GLUCAP 138* 178* 149* 180* 121*   Lipid Profile: No results for input(s): CHOL, HDL, LDLCALC, TRIG, CHOLHDL, LDLDIRECT in the last 72 hours. Thyroid Function Tests: No results for input(s): TSH, T4TOTAL, FREET4, T3FREE, THYROIDAB in the last 72 hours. Anemia Panel: No results for input(s): VITAMINB12, FOLATE, FERRITIN, TIBC, IRON, RETICCTPCT in the last 72 hours. Sepsis Labs: Recent Labs  Lab 04/01/21 2140 04/01/21 2333 04/02/21 0558  LATICACIDVEN 2.4* 2.1* 1.4  Recent Results (from the past 240 hour(s))  Resp Panel by RT-PCR (Flu A&B, Covid) Nasopharyngeal Swab     Status: None   Collection Time: 04/01/21  9:39 PM   Specimen: Nasopharyngeal Swab; Nasopharyngeal(NP) swabs in vial transport medium  Result Value Ref Range Status   SARS Coronavirus 2 by RT PCR NEGATIVE NEGATIVE Final    Comment: (NOTE) SARS-CoV-2 target nucleic acids are NOT DETECTED.  The SARS-CoV-2 RNA is generally detectable in upper respiratory specimens during the acute phase of infection. The lowest concentration of SARS-CoV-2 viral copies this assay can detect is 138 copies/mL. A negative result does not preclude SARS-Cov-2 infection and should not be used as the sole basis for treatment or other patient management decisions. A negative result may occur with   improper specimen collection/handling, submission of specimen other than nasopharyngeal swab, presence of viral mutation(s) within the areas targeted by this assay, and inadequate number of viral copies(<138 copies/mL). A negative result must be combined with clinical observations, patient history, and epidemiological information. The expected result is Negative.  Fact Sheet for Patients:  EntrepreneurPulse.com.au  Fact Sheet for Healthcare Providers:  IncredibleEmployment.be  This test is no t yet approved or cleared by the [place] FDA and  has been authorized for detection and/or diagnosis of SARS-CoV-2 by FDA under an Emergency Use Authorization (EUA). This EUA will remain  in effect (meaning this test can be used) for the duration of the COVID-19 declaration under Section 564(b)(1) of the Act, 21 U.S.C.section 360bbb-3(b)(1), unless the authorization is terminated  or revoked sooner.       Influenza A by PCR NEGATIVE NEGATIVE Final   Influenza B by PCR NEGATIVE NEGATIVE Final    Comment: (NOTE) The Xpert Xpress SARS-CoV-2/FLU/RSV plus assay is intended as an aid in the diagnosis of influenza from Nasopharyngeal swab specimens and should not be used as a sole basis for treatment. Nasal washings and aspirates are unacceptable for Xpert Xpress SARS-CoV-2/FLU/RSV testing.  Fact Sheet for Patients: EntrepreneurPulse.com.au  Fact Sheet for Healthcare Providers: IncredibleEmployment.be  This test is not yet approved or cleared by the [place] FDA and has been authorized for detection and/or diagnosis of SARS-CoV-2 by FDA under an Emergency Use Authorization (EUA). This EUA will remain in effect (meaning this test can be used) for the duration of the COVID-19 declaration under Section 564(b)(1) of the Act, 21 U.S.C. section 360bbb-3(b)(1), unless the authorization is terminated  or revoked.  Performed at [facility], [place] [address], [place], [place] [address]   Blood Culture (routine x 2)     Status: None (Preliminary result)   Collection Time: 04/01/21  9:48 PM   Specimen: BLOOD  Result Value Ref Range Status   Specimen Description   Final    BLOOD LEFT ANTECUBITAL Performed at [facility] [address], [place], [place] [address]    Special Requests   Final    BOTTLES DRAWN AEROBIC AND ANAEROBIC Blood Culture results may not be optimal due to an excessive volume of blood received in culture bottles Performed at [facility] [address] [place], [place] [address]    Culture   Final    NO GROWTH 3 DAYS Performed at [facility], [place] [address], [place], [place] [address]    Report Status PENDING  Incomplete  Blood Culture (routine x 2)     Status: None (Preliminary result)   Collection Time: 04/01/21 10:50 PM   Specimen: BLOOD  Result Value Ref Range Status   Specimen Description   Final    BLOOD BLOOD LEFT FOREARM Performed at [facility] [address], [name], [place] [address]    Special Requests   Final    BOTTLES DRAWN AEROBIC AND ANAEROBIC Blood Culture results may not be optimal due to an excessive volume of blood received in culture bottles Performed at [facility] [address] [place], [place] [address]    Culture   Final    NO GROWTH 3 DAYS Performed at [facility], [place] [address], [place], [place] [address]    Report Status PENDING  Incomplete  Urine Culture     Status: None   Collection Time: 04/01/21 11:40 PM   Specimen: In/Out Cath Urine  Result Value Ref Range Status   Specimen Description   Final    IN/OUT CATH URINE Performed at [facility] [address] [place], [place] [address]    Special Requests   Final    NONE Performed at [facility], [place] [address], [place], [place] [address]    Culture   Final    NO GROWTH Performed at [facility], [place] [address], [place], [place] [address]    Report Status 04/03/2021 FINAL  Final         Radiology Studies: No results found.      Scheduled Meds:  amLODipine  5 mg Oral Daily   baclofen  10 mg Oral TID   carvedilol  3.125 mg Oral BID WC   Chlorhexidine Gluconate Cloth  6 each Topical Daily   feeding supplement  1 Container Oral TID BM   insulin aspart  0-9 Units Subcutaneous Q4H   multivitamin with minerals  1 tablet Oral Daily   pantoprazole (PROTONIX) IV  40 mg Intravenous Q12H   sodium chloride flush  10-40 mL Intracatheter Q12H   sodium chloride flush  10-40 mL Intracatheter Q12H   sucralfate  1 g Oral TID WC & HS   Continuous Infusions:  potassium chloride       LOS: 4 days   Time spent= 35 mins    Rodriguez Aguinaldo Arsenio Loader, MD [facility]  If 7PM-7AM, please contact night-coverage  04/06/2021, 7:40 AM

## 2021-04-06 NOTE — TOC Progression Note (Signed)
Transition of Care [facility]) - Progression Note    Patient Details  Name: Jeffrey Young MRN: [id number] Date of Birth: 02-Oct-1965  [facility] CM/SW Contact  Leeroy Cha, RN Phone Number: 04/06/2021, 9:10 AM  Clinical Narrative:    55 year old black male with a colon cancer [descending colon mass] diagnosed in March, this year, s/p left hemicolectomy followed by chemotherapy completed in September, this year, admitted with CGE with fever, tachycardia and altered mental status over the weekend. Noted to be septic but no source could be identified. CT scan showed diffuse thickening of the mis-to-distal esophagus consistent with infectious vs inflammatory esophagitis. EGD was planned for today but was postponed due to lack of cardiac clearance. Patient's symptoms started 4 days PTA. He had an EGD done by Dr. Benson Norway in May this year, that revealed Grade IV distal esophagitis. He seems to have improved with broad spectrum antibiotics. He is able to swallow liquids but cannot eat solids as "they come right back up".  EGD performed on 111522. TOC PLAN OF CARE: Following for toc home needs-should be able to go home with wife and self care. Folloing for progression.  Expected Discharge Plan: Home/Self Care Barriers to Discharge: Continued Medical Work up  Expected Discharge Plan and Services Expected Discharge Plan: Home/Self Care   Discharge Planning Services: CM Consult   Living arrangements for the past 2 months: Single Family Home                                       Social Determinants of Health (SDOH) Interventions    Readmission Risk Interventions No flowsheet data found.

## 2021-04-06 NOTE — Progress Notes (Signed)

## 2021-04-07 ENCOUNTER — Inpatient Hospital Stay (HOSPITAL_COMMUNITY): Payer: 59

## 2021-04-07 ENCOUNTER — Telehealth: Payer: Self-pay | Admitting: *Deleted

## 2021-04-07 LAB — CBC
HCT: 27.4 % — ABNORMAL LOW (ref 39.0–52.0)
Hemoglobin: 9.3 g/dL — ABNORMAL LOW (ref 13.0–17.0)
MCH: 32.1 pg (ref 26.0–34.0)
MCHC: 33.9 g/dL (ref 30.0–36.0)
MCV: 94.5 fL (ref 80.0–100.0)
Platelets: 166 10*3/uL (ref 150–400)
RBC: 2.9 MIL/uL — ABNORMAL LOW (ref 4.22–5.81)
RDW: 12.2 % (ref 11.5–15.5)
WBC: 5.7 10*3/uL (ref 4.0–10.5)
nRBC: 0 % (ref 0.0–0.2)

## 2021-04-07 LAB — MAGNESIUM: Magnesium: 2 mg/dL (ref 1.7–2.4)

## 2021-04-07 LAB — GLUCOSE, CAPILLARY
Glucose-Capillary: 146 mg/dL — ABNORMAL HIGH (ref 70–99)
Glucose-Capillary: 149 mg/dL — ABNORMAL HIGH (ref 70–99)
Glucose-Capillary: 150 mg/dL — ABNORMAL HIGH (ref 70–99)
Glucose-Capillary: 152 mg/dL — ABNORMAL HIGH (ref 70–99)
Glucose-Capillary: 175 mg/dL — ABNORMAL HIGH (ref 70–99)
Glucose-Capillary: 202 mg/dL — ABNORMAL HIGH (ref 70–99)

## 2021-04-07 LAB — CULTURE, BLOOD (ROUTINE X 2)
Culture: NO GROWTH
Culture: NO GROWTH

## 2021-04-07 LAB — BASIC METABOLIC PANEL
Anion gap: 7 (ref 5–15)
BUN: 24 mg/dL — ABNORMAL HIGH (ref 6–20)
CO2: 24 mmol/L (ref 22–32)
Calcium: 8.3 mg/dL — ABNORMAL LOW (ref 8.9–10.3)
Chloride: 104 mmol/L (ref 98–111)
Creatinine, Ser: 1.57 mg/dL — ABNORMAL HIGH (ref 0.61–1.24)
GFR, Estimated: 51 mL/min — ABNORMAL LOW (ref >60)
Glucose, Bld: 152 mg/dL — ABNORMAL HIGH (ref 70–99)
Potassium: 3.3 mmol/L — ABNORMAL LOW (ref 3.5–5.1)
Sodium: 135 mmol/L (ref 135–145)

## 2021-04-07 MED ORDER — POTASSIUM CHLORIDE CRYS ER 20 MEQ PO TBCR
40.0000 meq | EXTENDED_RELEASE_TABLET | Freq: Once | ORAL | Status: DC
  Filled 2021-04-07: qty 2

## 2021-04-07 MED ORDER — POTASSIUM CHLORIDE 10 MEQ/100ML IV SOLN
10.0000 meq | INTRAVENOUS | Status: AC
  Administered 2021-04-07 (×6): 10 meq via INTRAVENOUS
  Filled 2021-04-07 (×2): qty 100

## 2021-04-07 NOTE — Discharge Planning (Signed)
Oncology Discharge Planning Note  [facility] Address: [address] [place], [place], [place] [address] Hours of Operation:  Nena Polio, Monday - Friday  Clinic Contact Information:  [phone]  Oncology Care Team: Medical Oncologist:  Dr. Betsy Coder    Patient Details: Name:  Jeffrey Young, Jeffrey Young MRN:   [id number] DOB:   1965-06-10 Reason for Current Admission: GI bleed  Discharge Planning Narrative: Notification of admission noted upon chart review for [employer] call for Jeffrey Young.  Discharge follow-up appointments for oncology will be determined upon discharge Upon discharge from the hospital, hematology/oncology's post discharge plan of care for the outpatient setting is: Continued observation since he has completed chemotherapy.   Jeffrey Young will be called within two business days after discharge to review hematology/oncology's plan of care for full understanding.    Outpatient Oncology Specific Care Only: Oncology appointment transportation needs addressed?:  yes--none needed per Jeffrey Young Oncology medication management for symptom management addressed?:  yes Chemo Alert Card reviewed?:  not applicable Immunotherapy Alert Card reviewed?:  not applicable Spoke w/Jeffrey Young today regarding current status.

## 2021-04-07 NOTE — Telephone Encounter (Signed)
Oncology Discharge Planning Admission Note  [facility] Address: [place], [place], [place] [address] Hours of Operation:  8am - 5pm, Monday - Friday  Clinic Contact Information:  [phone]  Oncology Care Team: Medical Oncologist:  Dr. Betsy Coder  Contacted  Mrs. Radoncic  to inform that the oncology provider Dr. Benay Spice is aware of this hospital admission dated 04/02/21, and the cancer center will follow [facility] inpatient care to assist with discharge planning as indicated by the oncologist.  We will reach out to you closer to discharge date to arrange your follow up care. Mr. Hoglund reports she is due on 04/16/21 his examination for his SSDisability and was told if he does not make the appointment he will have an automatic denial of claim. She is asking if this is accurate and how to reschedule if needed. Encouraged her to have hospital nurse contact the CSW to call her to discuss.  Disclaimer:  This [facility] note does not imply a formal consult request has been made by the admitting attending for this admission or there will be an inpatient consult completed by oncology.  Please request oncology consults as per standard process as indicated.

## 2021-04-07 NOTE — Telephone Encounter (Signed)
Returned call to [employer] from 04/06/21 voice mail and spoke w/Sherrie. His LTD company asking if 04/20/21 is still his potential return to work date? Informed Sherrie that he was admitted to hospital on 04/02/21 and is still currently inpatient. Will need to re-evaluate after his discharge if/when he can return to work. His LTD has been approved to 01/2023, but they require updates. Claim #[id number]. Wife tells RN that he still can only walk about 10 minutes before he needs to rest and he still has poor strength and coordination of his fingers/hands. Balance is impaired and that he holds her arm when he is out walking.

## 2021-04-07 NOTE — Progress Notes (Signed)
PROGRESS NOTE    Jeffrey Young  HKV:[id number] DOB: 04/04/1966 DOA: 04/01/2021 PCP: Jeffrey Limbo, MD   Brief Narrative:  Jeffrey Young-year-old with history of colon cancer, HTN, HLD, insulin-dependent DM2, diastolic CHF, CKD stage III yea brought to the hospital for confusion and coffee-ground emesis.  Initial work-up was unremarkable for any infection.  CT of the abdomen pelvis showed esophageal thickening concerning for infection versus inflammatory.  Head CT was negative without any meningeal signs.  He was placed on broad-spectrum antibiotics, GI was consulted.  EGD performed on 11/15 showed grade D esophagitis without evidence of active bleeding in the normal stomach.  He was started on Carafate and baclofen 3 times daily.   Assessment & Plan:   Principal Problem:   GI bleed Active Problems:   Severe sepsis (HCC)   AKI (acute kidney injury) ([place])   Acute metabolic encephalopathy   Hypokalemia   Encephalopathy   Nausea and vomiting   Coffee ground emesis   Elevated bilirubin   Upper GI bleed/esophagitis, coffee-ground emesis Nausea vomiting -Hemoglobin remained stable.  CT showed esophagitis.  repeat endoscopy in 11/15 which also showed grade D esophagitis.  Recommended continuing PPI, added sucralfate and baclofen 3 times daily.  Due to recurrent nausea and vomiting, will put him back on full liquid diet and order abdominal x-ray.  He require longer time for his esophagitis to heal.  Hypokalemia - As needed repletion   SIRS; Improved.  -Still no clear evidence of infection.  COVID-19/flu/UA/chest x-ray overall is negative.  Antibiotics have been discontinued.  Clinically monitor him.   AKI on CKD stage IIIa; Resolved -Baseline around 1.5.  Admission creatinine 1.9.     Acute metabolic encephalopathy, resolved -Unclear etiology.  CT head negative, neuro exam is nonfocal.  Ammonia level was unremarkable, B12 normal, TSH normal    New T wave inversions, elevated troponin  - -Troponins remain flat.  Patient is chest pain-free.  Echocardiogram shows normal EF; G1DD.  We will hold off on any further inpatient work-up   Colon cancer  -Outpatient follow-up with Dr Jeffrey Young.  Last chemo was September 2022.   Insulin-dependent type 2 diabetes with hyperglycemia, poorly controlled -Poorly controlled.  Hemoglobin A1c is 11.4.   Essential hypertension- -Continue Norvasc, Coreg resume home antihypertensives, initially they were held in the setting of sepsis  Will attempt to remove Foley catheter today.  DVT prophylaxis: SCDs Start: 04/02/21 0437 Code Status: Full Family Communication:    Status is: Inpatient  Remains inpatient appropriate because: Having difficulty tolerating orals requiring antiemetics.  Ongoing evaluation.  Also awaiting GI clearance prior to discharge.  Nutritional status  Nutrition Problem: Increased nutrient needs Etiology: cancer and cancer related treatments  Signs/Symptoms: estimated needs  Interventions: Boost Breeze, MVI  Body mass index is 24.8 kg/m.           Subjective: Patient tells me yesterday afternoon he started feeling nausea especially with attempts to take anything orally.  Overnight his nausea worsened and this morning he is not able to tolerate any orals much.  He does not feel as well as he felt yesterday  Review of Systems Otherwise negative except as per HPI, including: General: Denies fever, chills, night sweats or unintended weight loss. Resp: Denies cough, wheezing, shortness of breath. Cardiac: Denies chest pain, palpitations, orthopnea, paroxysmal nocturnal dyspnea. GI: Denies abdominal pain,  diarrhea or constipation GU: Denies dysuria, frequency, hesitancy or incontinence MS: Denies muscle aches, joint pain or swelling Neuro: Denies headache, neurologic deficits (focal weakness, numbness, tingling),  abnormal gait Psych: Denies anxiety, depression, SI/HI/AVH Skin: Denies new rashes or  lesions ID: Denies sick contacts, exotic exposures, travel  Examination:  Constitutional: Not in acute distress Respiratory: Clear to auscultation bilaterally Cardiovascular: Normal sinus rhythm, no rubs Abdomen: Nontender nondistended good bowel sounds Musculoskeletal: No edema noted Skin: No rashes seen Neurologic: CN 2-12 grossly intact.  And nonfocal Psychiatric: Normal judgment and insight. Alert and oriented x 3. Normal mood.    Objective: Vitals:   04/06/21 1340 04/06/21 1939 04/06/21 2034 04/07/21 0444  BP: (!) 152/78 (!) 207/102 (!) 173/95 (!) 156/86  Pulse: 66 68 84 74  Resp: 20  20 19   Temp: 98.7 F (37.1 C)  98.4 F (36.9 C) 98.4 F (36.9 C)  TempSrc: Oral  Oral Oral  SpO2: 100%  100% 99%  Weight:      Height:       No intake or output data in the 24 hours ending 04/07/21 0948  Filed Weights   04/01/21 2117 04/05/21 1331  Weight: 85.4 kg 85.3 kg     Data Reviewed:   CBC: Recent Labs  Lab 04/01/21 2130 04/02/21 0558 04/03/21 0436 04/04/21 0433 04/06/21 0542 04/07/21 0331  WBC 15.4* 15.6* 11.0* 8.3 6.2 5.7  NEUTROABS 12.7*  --   --   --   --   --   HGB 13.0 12.0* 11.4* 10.7* 9.0* 9.3*  HCT 37.6* 35.2* 33.7* 31.6* 27.0* 27.4*  MCV 91.0 92.4 94.9 94.6 95.1 94.5  PLT 255 233 187 173 168 431   Basic Metabolic Panel: Recent Labs  Lab 04/02/21 0558 04/03/21 0436 04/04/21 0433 04/05/21 1559 04/06/21 0542 04/07/21 0331  NA 134* 138 136 134* 131* 135  K 3.3* 3.1* 3.2* 3.1* 2.8* 3.3*  CL 96* 102 102 102 100 104  CO2 29 26 26 23 24 24   GLUCOSE 150* 127* 117* 150* 159* 152*  BUN 27* 29* 34* 35* 32* 24*  CREATININE 2.09* 1.92* 1.80* 1.73* 1.52* 1.57*  CALCIUM 9.3 8.7* 8.3* 8.0* 7.9* 8.3*  MG 1.9 1.8  --   --  1.8 2.0  PHOS  --   --   --   --  3.0  --    GFR: Estimated Creatinine Clearance: 58.7 mL/min (A) (by C-G formula based on SCr of 1.57 mg/dL (H)). Liver Function Tests: Recent Labs  Lab 04/01/21 2130 04/03/21 0436  AST 19 17  ALT  10 9  ALKPHOS 169* 113  BILITOT 1.7* 1.9*  PROT 7.6 6.2*  ALBUMIN 3.4* 2.6*   No results for input(s): LIPASE, AMYLASE in the last 168 hours. Recent Labs  Lab 04/01/21 2140  AMMONIA 6*   Coagulation Profile: Recent Labs  Lab 04/01/21 2130  INR 0.9   Cardiac Enzymes: No results for input(s): CKTOTAL, CKMB, CKMBINDEX, TROPONINI in the last 168 hours. BNP (last 3 results) No results for input(s): PROBNP in the last 8760 hours. HbA1C: No results for input(s): HGBA1C in the last 72 hours. CBG: Recent Labs  Lab 04/06/21 1621 04/06/21 2032 04/07/21 0019 04/07/21 0440 04/07/21 0751  GLUCAP 131* 163* 175* 146* 150*   Lipid Profile: No results for input(s): CHOL, HDL, LDLCALC, TRIG, CHOLHDL, LDLDIRECT in the last 72 hours. Thyroid Function Tests: No results for input(s): TSH, T4TOTAL, FREET4, T3FREE, THYROIDAB in the last 72 hours. Anemia Panel: No results for input(s): VITAMINB12, FOLATE, FERRITIN, TIBC, IRON, RETICCTPCT in the last 72 hours. Sepsis Labs: Recent Labs  Lab 04/01/21 2140 04/01/21 2333 04/02/21 0558  LATICACIDVEN 2.4* 2.1*  1.4    Recent Results (from the past 240 hour(s))  Resp Panel by RT-PCR (Flu A&B, Covid) Nasopharyngeal Swab     Status: None   Collection Time: 04/01/21  9:39 PM   Specimen: Nasopharyngeal Swab; Nasopharyngeal(NP) swabs in vial transport medium  Result Value Ref Range Status   SARS Coronavirus 2 by RT PCR NEGATIVE NEGATIVE Final    Comment: (NOTE) SARS-CoV-2 target nucleic acids are NOT DETECTED.  The SARS-CoV-2 RNA is generally detectable in upper respiratory specimens during the acute phase of infection. The lowest concentration of SARS-CoV-2 viral copies this assay can detect is 138 copies/mL. A negative result does not preclude SARS-Cov-2 infection and should not be used as the sole basis for treatment or other patient management decisions. A negative result may occur with  improper specimen collection/handling, submission  of specimen other than nasopharyngeal swab, presence of viral mutation(s) within the areas targeted by this assay, and inadequate number of viral copies(<138 copies/mL). A negative result must be combined with clinical observations, patient history, and epidemiological information. The expected result is Negative.  Fact Sheet for Patients:  EntrepreneurPulse.com.au  Fact Sheet for Healthcare Providers:  IncredibleEmployment.be  This test is no t yet approved or cleared by the [place] FDA and  has been authorized for detection and/or diagnosis of SARS-CoV-2 by FDA under an Emergency Use Authorization (EUA). This EUA will remain  in effect (meaning this test can be used) for the duration of the COVID-19 declaration under Section 564(b)(1) of the Act, 21 U.S.C.section 360bbb-3(b)(1), unless the authorization is terminated  or revoked sooner.       Influenza A by PCR NEGATIVE NEGATIVE Final   Influenza B by PCR NEGATIVE NEGATIVE Final    Comment: (NOTE) The Xpert Xpress SARS-CoV-2/FLU/RSV plus assay is intended as an aid in the diagnosis of influenza from Nasopharyngeal swab specimens and should not be used as a sole basis for treatment. Nasal washings and aspirates are unacceptable for Xpert Xpress SARS-CoV-2/FLU/RSV testing.  Fact Sheet for Patients: EntrepreneurPulse.com.au  Fact Sheet for Healthcare Providers: IncredibleEmployment.be  This test is not yet approved or cleared by the [place] FDA and has been authorized for detection and/or diagnosis of SARS-CoV-2 by FDA under an Emergency Use Authorization (EUA). This EUA will remain in effect (meaning this test can be used) for the duration of the COVID-19 declaration under Section 564(b)(1) of the Act, 21 U.S.C. section 360bbb-3(b)(1), unless the authorization is terminated or revoked.  Performed at [facility], [place]  [address] [place], [place] [address]   Blood Culture (routine x 2)     Status: None (Preliminary result)   Collection Time: 04/01/21  9:48 PM   Specimen: BLOOD  Result Value Ref Range Status   Specimen Description   Final    BLOOD LEFT ANTECUBITAL Performed at [facility] [address] [place], [place] [address]    Special Requests   Final    BOTTLES DRAWN AEROBIC AND ANAEROBIC Blood Culture results may not be optimal due to an excessive volume of blood received in culture bottles Performed at [facility] [address], [place], [place] [address]    Culture   Final    NO GROWTH 4 DAYS Performed at [facility], [place] [address] [place], [place] [address]    Report Status PENDING  Incomplete  Blood Culture (routine x 2)     Status: None (Preliminary result)   Collection Time: 04/01/21 10:50 PM  Specimen: BLOOD  Result Value Ref Range Status   Specimen Description   Final    BLOOD BLOOD LEFT FOREARM Performed at [facility] [address], [place], [place] [address]    Special Requests   Final    BOTTLES DRAWN AEROBIC AND ANAEROBIC Blood Culture results may not be optimal due to an excessive volume of blood received in culture bottles Performed at [facility] [address], [place], [place] [address]    Culture   Final    NO GROWTH 4 DAYS Performed at [facility], [place] [address] [place], [place] [address]    Report Status PENDING  Incomplete  Urine Culture     Status: None   Collection Time: 04/01/21 11:40 PM   Specimen: In/Out Cath Urine  Result Value Ref Range Status   Specimen Description   Final    IN/OUT CATH URINE Performed at [facility] [address] [place], [place] [address]    Special Requests   Final    NONE Performed at [facility], [place] [address], [place], [place] [address]    Culture   Final    NO  GROWTH Performed at [facility], [place] [address] [place],  [address]    Report Status 04/03/2021 FINAL  Final         Radiology Studies: No results found.      Scheduled Meds:  amLODipine  5 mg Oral Daily   baclofen  10 mg Oral TID   carvedilol  3.125 mg Oral BID WC   Chlorhexidine Gluconate Cloth  6 each Topical Daily   feeding supplement  1 Container Oral TID BM   insulin aspart  0-9 Units Subcutaneous Q4H   multivitamin with minerals  1 tablet Oral Daily   pantoprazole  40 mg Oral BID   sodium chloride flush  10-40 mL Intracatheter Q12H   sodium chloride flush  10-40 mL Intracatheter Q12H   sucralfate  1 g Oral TID WC & HS   Continuous Infusions:  potassium chloride 10 mEq (04/07/21 0859)     LOS: 5 days   Time spent= 35 mins    Trevaris Pennella Arsenio Loader, MD [facility]  If 7PM-7AM, please contact night-coverage  04/07/2021, 9:48 AM

## 2021-04-08 ENCOUNTER — Inpatient Hospital Stay (HOSPITAL_COMMUNITY): Payer: 59

## 2021-04-08 DIAGNOSIS — C189 Malignant neoplasm of colon, unspecified: Secondary | ICD-10-CM | POA: Diagnosis not present

## 2021-04-08 LAB — GLUCOSE, CAPILLARY
Glucose-Capillary: 187 mg/dL — ABNORMAL HIGH (ref 70–99)
Glucose-Capillary: 215 mg/dL — ABNORMAL HIGH (ref 70–99)
Glucose-Capillary: 219 mg/dL — ABNORMAL HIGH (ref 70–99)
Glucose-Capillary: 233 mg/dL — ABNORMAL HIGH (ref 70–99)
Glucose-Capillary: 254 mg/dL — ABNORMAL HIGH (ref 70–99)
Glucose-Capillary: 260 mg/dL — ABNORMAL HIGH (ref 70–99)
Glucose-Capillary: 300 mg/dL — ABNORMAL HIGH (ref 70–99)

## 2021-04-08 LAB — BASIC METABOLIC PANEL
Anion gap: 5 (ref 5–15)
BUN: 24 mg/dL — ABNORMAL HIGH (ref 6–20)
CO2: 25 mmol/L (ref 22–32)
Calcium: 8.2 mg/dL — ABNORMAL LOW (ref 8.9–10.3)
Chloride: 103 mmol/L (ref 98–111)
Creatinine, Ser: 1.68 mg/dL — ABNORMAL HIGH (ref 0.61–1.24)
GFR, Estimated: 47 mL/min — ABNORMAL LOW (ref >60)
Glucose, Bld: 265 mg/dL — ABNORMAL HIGH (ref 70–99)
Potassium: 3.5 mmol/L (ref 3.5–5.1)
Sodium: 133 mmol/L — ABNORMAL LOW (ref 135–145)

## 2021-04-08 LAB — CBC
HCT: 27 % — ABNORMAL LOW (ref 39.0–52.0)
Hemoglobin: 8.9 g/dL — ABNORMAL LOW (ref 13.0–17.0)
MCH: 31.7 pg (ref 26.0–34.0)
MCHC: 33 g/dL (ref 30.0–36.0)
MCV: 96.1 fL (ref 80.0–100.0)
Platelets: 181 10*3/uL (ref 150–400)
RBC: 2.81 MIL/uL — ABNORMAL LOW (ref 4.22–5.81)
RDW: 12.4 % (ref 11.5–15.5)
WBC: 6.7 10*3/uL (ref 4.0–10.5)
nRBC: 0 % (ref 0.0–0.2)

## 2021-04-08 LAB — MAGNESIUM: Magnesium: 2 mg/dL (ref 1.7–2.4)

## 2021-04-08 MED ORDER — GLUCERNA SHAKE PO LIQD
237.0000 mL | Freq: Three times a day (TID) | ORAL | Status: DC
  Administered 2021-04-08 – 2021-04-15 (×2): 237 mL via ORAL
  Filled 2021-04-08 (×22): qty 237

## 2021-04-08 MED ORDER — POTASSIUM CHLORIDE IN NACL 20-0.9 MEQ/L-% IV SOLN
INTRAVENOUS | Status: AC
  Filled 2021-04-08 (×2): qty 1000

## 2021-04-08 MED ORDER — ENSURE MAX PROTEIN PO LIQD
11.0000 [oz_av] | Freq: Every day | ORAL | Status: DC
  Administered 2021-04-15: 11 [oz_av] via ORAL
  Filled 2021-04-08 (×9): qty 330

## 2021-04-08 MED ORDER — INSULIN GLARGINE-YFGN 100 UNIT/ML ~~LOC~~ SOLN
10.0000 [IU] | Freq: Every day | SUBCUTANEOUS | Status: DC
  Administered 2021-04-08 – 2021-04-15 (×8): 10 [IU] via SUBCUTANEOUS
  Filled 2021-04-08 (×8): qty 0.1

## 2021-04-08 NOTE — Plan of Care (Signed)
?  Problem: Clinical Measurements: ?Goal: Respiratory complications will improve ?Outcome: Progressing ?  ?Problem: Activity: ?Goal: Risk for activity intolerance will decrease ?Outcome: Progressing ?  ?Problem: Nutrition: ?Goal: Adequate nutrition will be maintained ?Outcome: Progressing ?  ?Problem: Coping: ?Goal: Level of anxiety will decrease ?Outcome: Progressing ?  ?

## 2021-04-08 NOTE — Progress Notes (Addendum)
HEMATOLOGY-ONCOLOGY PROGRESS NOTE  SUBJECTIVE: Jeffrey Young is followed by our office for colon cancer.  He completed adjuvant chemotherapy on 02/15/2021.  He has been admitted due to confusion and coffee-ground emesis.  He was seen by GI and underwent an upper endoscopy on 04/05/2021 which was consistent with esophagitis.  This morning, he denies recurrent coffee-ground emesis.  Diet is being advanced.  He has no abdominal pain, nausea, vomiting.  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   08/11/2020 Cancer Staging   Staging form: Colon and Rectum, AJCC 8th Edition - Clinical: Stage IIIC (cT3, cN2b, cM0) - Signed by Ladell Pier, MD on 08/11/2020 Histologic grade (G): G2 Histologic grading system: 4 grade system Laterality: Left Lymph-vascular invasion (LVI): LVI present/identified, NOS Tumor deposits (TD): Present Perineural invasion (PNI): Absent Microsatellite instability (MSI): Stable    08/30/2020 -  Chemotherapy   Patient is on Treatment Plan : COLORECTAL FOLFOX q14d x 6 months      PHYSICAL EXAMINATION:  Vitals:   04/08/21 0441 04/08/21 0828  BP: (!) 141/82 (!) 141/66  Pulse: 70 67  Resp: 18   Temp: 98.7 F (37.1 C)   SpO2: 100%    Filed Weights   04/01/21 2117 04/05/21 1331  Weight: 85.4 kg 85.3 kg    Intake/Output from previous day: 11/17 0701 - 11/18 0700 In: 1080 [P.O.:480; IV Piggyback:600] Out: 950 [Urine:950]  GENERAL:alert, no distress and comfortable SKIN: skin color, texture, turgor are normal, no rashes or significant lesions EYES: normal, Conjunctiva are pink and non-injected, sclera clear LUNGS: clear to auscultation and percussion with normal breathing effort HEART: regular rate & rhythm and no murmurs and no lower extremity edema ABDOMEN:abdomen soft, non-tender  and normal bowel sounds  NEURO: alert & oriented x 3 with fluent speech, no focal motor/sensory deficits  LABORATORY DATA:  I have reviewed the data as listed CMP Latest Ref Rng & Units 04/08/2021 04/07/2021 04/06/2021  Glucose 70 - 99 mg/dL 265(H) 152(H) 159(H)  BUN 6 - 20 mg/dL 24(H) 24(H) 32(H)  Creatinine 0.61 - 1.24 mg/dL 1.68(H) 1.57(H) 1.52(H)  Sodium 135 - 145 mmol/L 133(L) 135 131(L)  Potassium 3.5 - 5.1 mmol/L 3.5 3.3(L) 2.8(L)  Chloride 98 - 111 mmol/L 103 104 100  CO2 22 - 32 mmol/L _0 Calcium 8.9 - 10.3 mg/dL 8.2(L) 8.3(L) 7.9(L)  Total Protein 6.5 - 8.1 g/dL - - -  Total Bilirubin 0.3 - 1.2 mg/dL - - -  Alkaline Phos 38 - 126 U/L - - -  AST 15 - 41 U/L - - -  ALT 0 - 44 U/L - - -    Lab Results  Component Value Date   WBC 6.7 04/08/2021   HGB 8.9 (L) 04/08/2021   HCT 27.0 (L) 04/08/2021   MCV 96.1 04/08/2021   PLT 181 04/08/2021   NEUTROABS 12.7 (H) 04/01/2021    CT ABDOMEN PELVIS WO CONTRAST  Result Date: 04/02/2021 CLINICAL DATA:  Suspected bowel obstruction. Under treatment for colon cancer. EXAM: CT ABDOMEN AND PELVIS WITHOUT CONTRAST TECHNIQUE: Multidetector CT imaging of the abdomen and pelvis was performed following the standard protocol without IV contrast. COMPARISON:  12/03/2020. FINDINGS: Lower chest: The heart is normal in size and there is a trace pericardial effusion. The distal tip of a central venous catheter terminates in the right atrium. Dependent  atelectasis is noted at the lung bases. Hepatobiliary: No focal liver abnormality is seen. No gallstones, gallbladder wall thickening, or biliary dilatation. Pancreas: Unremarkable. No pancreatic ductal dilatation or surrounding inflammatory changes. Spleen: Normal in size without focal abnormality. Adrenals/Urinary Tract: No adrenal nodule or mass. No renal or ureteral calculus or obstructive uropathy bilaterally. There is a cyst in the lower pole the right kidney measuring 1.7 cm. The urinary  bladder is partially distended and a Foley catheter is noted. Stomach/Bowel: There is a small hiatal hernia with diffuse thickening in the mid to distal esophagus. No bowel obstruction, free air or pneumatosis. The patient is status post left hemicolectomy with an anastomotic site in the mid left abdomen. Evaluation of the bowel is limited due to lack of IV and oral contrast. The appendix is not visualized on exam. Vascular/Lymphatic: No significant vascular findings are present. No enlarged abdominal or pelvic lymph nodes. Reproductive: The prostate gland is enlarged measuring 6.1 cm in diameter. Other: No free fluid. There is a small fat containing inguinal hernia on the left. Musculoskeletal: No acute fracture or suspicious osseous abnormality. IMPRESSION: 1. No bowel obstruction or free air. 2. Small hiatal hernia with diffuse thickening of the mid to distal esophago is which may be infectious or inflammatory. 3. Status post left hemicolectomy. Evaluation of the bowel is somewhat limited due to lack of IV and oral contrast. 4. Right renal cyst. 5. Enlarged prostate gland. Electronically Signed   By: Brett Fairy M.D.   On: 04/02/2021 00:43   DG Chest 2 View  Result Date: 04/02/2021 CLINICAL DATA:  Vomiting, colon cancer EXAM: CHEST - 2 VIEW COMPARISON:  04/01/2021 FINDINGS: Patient is rotated. Lungs are clear.  No pleural effusion or pneumothorax. Heart is top-normal in size. Left chest port terminates cavoatrial junction. IMPRESSION: No evidence of acute cardiopulmonary disease. Electronically Signed   By: Julian Hy M.D.   On: 04/02/2021 02:46   DG Abd 1 View  Result Date: 04/07/2021 CLINICAL DATA:  Abdominal distension. EXAM: ABDOMEN - 1 VIEW COMPARISON:  CT 04/02/2021 FINDINGS: No bowel dilatation to suggest obstruction. Moderate stool in the ascending and transverse colon, small volume of distal stool. Enteric sutures at the splenic flexure. No radiopaque calculi or abnormal soft tissue  calcifications. No evidence of free air. Unremarkable osseous structures. IMPRESSION: No explanation for abdominal distension. No bowel obstruction. Small to moderate colonic stool burden. Electronically Signed   By: Keith Rake M.D.   On: 04/07/2021 15:33   CT Head Wo Contrast  Result Date: 04/02/2021 CLINICAL DATA:  Delirium, altered mental status. EXAM: CT HEAD WITHOUT CONTRAST TECHNIQUE: Contiguous axial images were obtained from the base of the skull through the vertex without intravenous contrast. COMPARISON:  11/27/2005. FINDINGS: Brain: No acute intracranial hemorrhage, midline shift or mass effect. Generalized atrophy is noted. No extra-axial fluid collection is seen. Gray-white matter differentiation is within normal limits and there is no hydrocephalus. Vascular: No hyperdense vessel or unexpected calcification. Skull: Normal. Negative for fracture or focal lesion. Sinuses/Orbits: No acute finding. Other: None. IMPRESSION: No acute intracranial process. Electronically Signed   By: Brett Fairy M.D.   On: 04/02/2021 00:32   DG Chest Port 1 View  Result Date: 04/01/2021 CLINICAL DATA:  Coffee-ground emesis EXAM: PORTABLE CHEST 1 VIEW COMPARISON:  08/24/2020 FINDINGS: Left-sided central venous port tip over the right atrium. Incompletely included left lung base. Cardiomegaly without definitive acute airspace consolidation, pleural effusion or pneumothorax. IMPRESSION: Cardiomegaly. No definite acute airspace disease allowing for positioning  and incomplete inclusion of left base. Electronically Signed   By: Donavan Foil M.D.   On: 04/01/2021 22:21   ECHOCARDIOGRAM COMPLETE  Result Date: 04/03/2021    ECHOCARDIOGRAM REPORT   Patient Name:   AMERIGO MCGLORY Date of Exam: 04/03/2021 Medical Rec #:  [id number]      Height:       73.0 in Accession #:    [id number]     Weight:       188.3 lb Date of Birth:  [birth year]      BSA:          2.097 m Patient Age:    55 years       BP:           150/95  mmHg Patient Gender: M              HR:           75 bpm. Exam Location:  Inpatient Procedure: 2D Echo, Cardiac Doppler and Color Doppler Indications:    R94.31 Abnormal EKG  History:        Patient has prior history of Echocardiogram examinations, most                 recent 07/25/2020. Signs/Symptoms:Shortness of Breath; Risk                 Factors:Diabetes and Hypertension.  Sonographer:    Glo Herring Referring Phys: [place]  1. Left ventricular ejection fraction, by estimation, is 60 to 65%. The left ventricle has normal function. The left ventricle has no regional wall motion abnormalities. There is mild left ventricular hypertrophy. Left ventricular diastolic parameters are consistent with Grade I diastolic dysfunction (impaired relaxation).  2. Right ventricular systolic function is normal. The right ventricular size is normal.  3. The mitral valve is normal in structure. No evidence of mitral valve regurgitation. No evidence of mitral stenosis.  4. The aortic valve is normal in structure. Aortic valve regurgitation is not visualized. No aortic stenosis is present.  5. The inferior vena cava is normal in size with greater than 50% respiratory variability, suggesting right atrial pressure of 3 mmHg. Comparison(s): Prior images unable to be directly viewed, comparison made by report only. Changes from prior study are noted. FINDINGS  Left Ventricle: Left ventricular ejection fraction, by estimation, is 60 to 65%. The left ventricle has normal function. The left ventricle has no regional wall motion abnormalities. The left ventricular internal cavity size was normal in size. There is  mild left ventricular hypertrophy. Left ventricular diastolic parameters are consistent with Grade I diastolic dysfunction (impaired relaxation). Right Ventricle: The right ventricular size is normal. No increase in right ventricular wall thickness. Right ventricular systolic function is normal. Left  Atrium: Left atrial size was normal in size. Right Atrium: Right atrial size was normal in size. Pericardium: There is no evidence of pericardial effusion. Mitral Valve: The mitral valve is normal in structure. No evidence of mitral valve regurgitation. No evidence of mitral valve stenosis. Tricuspid Valve: The tricuspid valve is normal in structure. Tricuspid valve regurgitation is not demonstrated. No evidence of tricuspid stenosis. Aortic Valve: The aortic valve is normal in structure. Aortic valve regurgitation is not visualized. No aortic stenosis is present. Aortic valve mean gradient measures 3.0 mmHg. Aortic valve peak gradient measures 5.6 mmHg. Aortic valve area, by VTI measures 2.91 cm. Pulmonic Valve: The pulmonic valve was normal in structure. Pulmonic valve regurgitation is not visualized. No  evidence of pulmonic stenosis. Aorta: The aortic root is normal in size and structure. Venous: The inferior vena cava is normal in size with greater than 50% respiratory variability, suggesting right atrial pressure of 3 mmHg. IAS/Shunts: No atrial level shunt detected by color flow Doppler.  LEFT VENTRICLE PLAX 2D LVIDd:         4.10 cm   Diastology LVIDs:         2.90 cm   LV e' medial:    4.35 cm/s LV PW:         1.20 cm   LV E/e' medial:  18.1 LV IVS:        1.20 cm   LV e' lateral:   5.98 cm/s LVOT diam:     2.00 cm   LV E/e' lateral: 13.2 LV SV:         58 LV SV Index:   28 LVOT Area:     3.14 cm  IVC IVC diam: 2.00 cm LEFT ATRIUM             Index LA diam:        2.90 cm 1.38 cm/m LA Vol (A2C):   55.0 ml 26.25 ml/m LA Vol (A4C):   40.2 ml 19.17 ml/m LA Biplane Vol: 53.2 ml 25.37 ml/m  AORTIC VALVE                    PULMONIC VALVE AV Area (Vmax):    2.96 cm     PV Vmax:       1.03 m/s AV Area (Vmean):   3.00 cm     PV Peak grad:  4.3 mmHg AV Area (VTI):     2.91 cm AV Vmax:           118.00 cm/s AV Vmean:          74.900 cm/s AV VTI:            0.201 m AV Peak Grad:      5.6 mmHg AV Mean Grad:       3.0 mmHg LVOT Vmax:         111.00 cm/s LVOT Vmean:        71.600 cm/s LVOT VTI:          0.186 m LVOT/AV VTI ratio: 0.93  AORTA Ao Root diam: 3.60 cm MITRAL VALVE MV Area (PHT): 3.36 cm     SHUNTS MV Decel Time: 226 msec     Systemic VTI:  0.19 m MV E velocity: 78.80 cm/s   Systemic Diam: 2.00 cm MV A velocity: 111.00 cm/s MV E/A ratio:  0.71 Candee Furbish MD Electronically signed by Candee Furbish MD Signature Date/Time: 04/03/2021/12:47:19 PM    Final    US Abdomen Limited RUQ (LIVER/GB)  Result Date: 04/02/2021 CLINICAL DATA:  55 year old male with history of elevated liver function tests. EXAM: ULTRASOUND ABDOMEN LIMITED RIGHT UPPER QUADRANT COMPARISON:  Renal ultrasound 07/26/2020. FINDINGS: Gallbladder: No gallstones or wall thickening visualized. No sonographic Murphy sign noted by sonographer. Common bile duct: Diameter: 4 mm Liver: No focal lesion identified. Within normal limits in parenchymal echogenicity. Portal vein is patent on color Doppler imaging with normal direction of blood flow towards the liver. Other: Anechoic lesion with increased through transmission in the right kidney measuring 2.2 x 2.2 x 1.7 cm, compatible with a simple cyst. IMPRESSION: 1. No acute findings to account for the patient's symptoms. Specifically, no signs of biliary tract obstruction. No cholelithiasis or evidence of  acute cholecystitis. Electronically Signed   By: Vinnie Langton M.D.   On: 04/02/2021 08:03    ASSESSMENT AND PLAN: Descending colon cancer, stage IIIc (T3N2b M0), status post a partial left colectomy 07/30/2020, 9/16 lymph nodes positive, lymphovascular invasion, 1 satellite nodule, negative margins, MSS, no loss of mismatch repair protein expression -History of large polyp in the left side of the colon-referred to [facility] in 05/2018 for procedure canceled secondary to COVID-19 pandemic.  Procedure was not rescheduled. -CT chest/abdomen/pelvis with contrast 07/27/2020-3 small pulmonary nodules less than 5 mm  favored to be benign, circumferential luminal narrowing of the distal transverse colon concerning for malignancy, no metastatic adenopathy in the mesentery porta hepatis, no for metastasis. -CEA on 07/27/2020 was 17.3; 37 on 08/30/2020; 33 on 09/13/2020 -Colonoscopy performed 07/27/2020 showed a fungating, infiltrative and ulcerated nonobstructing large mass in the proximal descending colon.  Biopsy-adenocarcinoma -Cycle 1 FOLFOX 08/30/2020 -Cycle 2 FOLFOX 09/13/2020, Emend added for delayed nausea -Cycle 3 FOLFOX 09/27/2020 -Cycle 4 FOLFOX 10/11/2020 -Cycle 5 FOLFOX 11/08/2020 -Cycle 6 FOLFOX 11/23/2020 -CT 12/03/2020-prior 3 mm left apical nodule no longer seen, no new/suspicious pulmonary nodules, no evidence of metastatic disease -Cycle 7 FOLFOX 12/06/2020 -Cycle 8 FOLFOX 12/21/2020 -Cycle 9 FOLFOX 01/03/2021 -Cycle 10 FOLFOX 01/17/2021 -Cycle 11 FOLFOX 01/31/2021, oxaliplatin held secondary to neuropathy symptoms -Cycle 12 FOLFOX 02/15/2021, oxaliplatin held secondary to neuropathy 2.  Anemia due to GI bleeding, iron deficiency?,  Renal insufficiency? 3.  New onset acute diastolic CHF March 4696 4.  Diabetes mellitus 5.  Renal insufficiency 6.  Hypertension 7.  History of left transmetatarsal amputation 8.  History of colon polyps 9.  Neuropathy 10.  Delayed nausea secondary to chemotherapy-Decadron prophylaxis added following cycle 7 FOLFOX (he did not take) 11.  Hospital admission 04/01/2021-upper GI bleed thought to be due to esophagitis, AKI  Mr. Zick has been admitted secondary to coffee-ground emesis and AKI.  Upper endoscopy was performed which was most consistent with esophagitis.  He has not had any recurrent hematemesis.  Diet is being advanced and he is tolerating this well.  Hemoglobin trending downward slightly but overall stable.  AKI has improved.  The patient recently completed adjuvant chemotherapy for colon cancer.  He was due for restaging studies last week and follow-up in our  office.  A CT of the abdomen/pelvis without contrast was performed on admission which did not show any evidence of recurrent malignancy.  We will go ahead and order a CT of the chest without contrast to complete his restaging work-up.  Recommendations: 1.  CT chest without contrast to complete restaging work-up. 2.  Advance diet as tolerated. 3.  Management of other medical conditions per hospitalist 4.  We will arrange for outpatient follow-up at the cancer center in approximately 6 weeks.  No future appointments.    LOS: 6 days   Mikey Bussing, DNP, AGPCNP-BC, AOCNP 04/08/21 Mr. Coye was interviewed and examined.  He was admitted with nausea/vomiting and hematemesis.  An upper endoscopy revealed esophagitis.  His clinical status has improved.  He is due for restaging CTs and an office visit at the cancer center this month.  There is no evidence of recurrent colon cancer on exam today.  The admission CT abdomen/pelvis reveals no evidence of recurrent disease.  We will complete the restaging evaluation with a noncontrast chest CT.  Outpatient follow-up will be scheduled at the Cancer center and a Port-A-Cath flush will be scheduled for within the next 4-6 weeks.  I was present for greater  than 50% of today's visit.  I performed medical decision making.

## 2021-04-08 NOTE — Progress Notes (Signed)
Inpatient Diabetes Program Recommendations  AACE/ADA: New Consensus Statement on Inpatient Glycemic Control (2015)  Target Ranges:  Prepandial:   less than 140 mg/dL      Peak postprandial:   less than 180 mg/dL (1-2 hours)      Critically ill patients:  140 - 180 mg/dL   Lab Results  Component Value Date   GLUCAP 187 (H) 04/08/2021   HGBA1C 11.4 (H) 04/02/2021    Review of Glycemic Control  Latest Reference Range & Units 04/07/21 07:51 04/07/21 12:26 04/07/21 16:22 04/07/21 20:23 04/07/21 23:40 04/08/21 04:38 04/08/21 07:32  Glucose-Capillary 70 - 99 mg/dL 150 (H) 152 (H) 149 (H) 202 (H) 260 (H) 254 (H) 187 (H)   Diabetes history: DM 2 Outpatient Diabetes medications: Glargine-Lixisenatide combo 10 units qam and 15 units qpm Current orders for Inpatient glycemic control:  Novolog 0-9 units Q4 hours  Inpatient Diabetes Program Recommendations:    Please add Semglee 10 units daily (this is less than 1/2 of home dose of insulin).   Thanks,  Adah Perl, RN, BC-ADM Inpatient Diabetes Coordinator Pager [phone]  (8a-5p)

## 2021-04-08 NOTE — Discharge Instructions (Addendum)
Follow up with Dr. Benay Spice on 05/05/21 at 11:30 am for port flush and office visit.

## 2021-04-08 NOTE — Progress Notes (Signed)
Nutrition Follow-up  DOCUMENTATION CODES:  Not applicable  INTERVENTION:  Discontinue Boost Breeze TID.  Add Glucerna Shake po TID, each supplement provides 220 kcal and 10 grams of protein.  Add Ensure Max po daily, each supplement provides 150 kcal and 30 grams of protein.    Continue MVI with minerals daily.  Encourage PO and supplement intake.  NUTRITION DIAGNOSIS:  Increased nutrient needs related to cancer and cancer related treatments as evidenced by estimated needs. - ongoing  GOAL:  Patient will meet greater than or equal to 90% of their needs - progressing  MONITOR:  PO intake, Supplement acceptance, Diet advancement, Labs, Weight trends, I & O's  REASON FOR ASSESSMENT:  Malnutrition Screening Tool    ASSESSMENT:  55 year old male with history of colon cancer, IDDM, HTN, HLD, chronic diastolic CHF, chronic kidney disease stage IIIa comes to the hospital with coffee-ground emesis and confusion. 11/15 - endoscopy showed grade D esophagitis  Spoke with pt at bedside. Pt's diet Dysphagia 3 now - advancing slowly.  RD to change supplements to more carb-friendly supplements.  Pt feeling an appetite and getting ready to order something for lunch.  Pt reports blurry vision when asked about vision changes, but he reports that it is worse when his sugars are not under control.  Supplements: Boost Breeze TID  Medications: reviewed; SSI, Semglee, MVI with minerals, Protonix BID, sucralfate QID, NaCl with K-Cl 20 mEq @ 75 ml/hr  Labs: reviewed; Na 133 (L), CBG 149-260 (H) HbA1c: 11.4% (04/02/21)  NUTRITION - FOCUSED PHYSICAL EXAM: Flowsheet Row Most Recent Value  Orbital Region Mild depletion  Upper Arm Region Mild depletion  Thoracic and Lumbar Region No depletion  Buccal Region No depletion  Temple Region Mild depletion  Clavicle Bone Region No depletion  Clavicle and Acromion Bone Region No depletion  Scapular Bone Region No depletion  Dorsal Hand No  depletion  Patellar Region No depletion  Anterior Thigh Region No depletion  Posterior Calf Region No depletion  Edema (RD Assessment) Mild  [Generalized]  Hair Reviewed  Eyes Reviewed  [reports blurry vision]  Mouth Reviewed  Skin Reviewed  Nails Reviewed   Diet Order:   Diet Order             DIET DYS 3 Room service appropriate? Yes; Fluid consistency: Thin  Diet effective now                  EDUCATION NEEDS:  Education needs have been addressed  Skin:  Skin Assessment: Reviewed RN Assessment  Last BM:  03/31/21  Height:  Ht Readings from Last 1 Encounters:  04/05/21 6\' 1"  (1.854 m)   Weight:  Wt Readings from Last 1 Encounters:  04/05/21 85.3 kg   BMI:  Body mass index is 24.8 kg/m.  Estimated Nutritional Needs:  Kcal:  2400-2600 Protein:  120-130g Fluid:  2.4L/day  Derrel Nip, RD, LDN (she/her/hers) Clinical Inpatient Dietitian RD Pager/After-Hours/Weekend Pager # in [place]

## 2021-04-08 NOTE — Progress Notes (Signed)
PROGRESS NOTE    Jeffrey Young  TMH:[id number] DOB: Sep 25, 1965 DOA: 04/01/2021 PCP: Jeffrey Limbo, MD   Brief Narrative:  Jeffrey Young-year-old with history of colon cancer, HTN, HLD, insulin-dependent DM2, diastolic CHF, CKD stage III yea brought to the hospital for confusion and coffee-ground emesis.  Initial work-up was unremarkable for any infection.  CT of the abdomen pelvis showed esophageal thickening concerning for infection versus inflammatory.  Head CT was negative without any meningeal signs.  He was placed on broad-spectrum antibiotics, GI was consulted.  EGD performed on 11/15 showed grade D esophagitis without evidence of active bleeding in the normal stomach.  He was started on Carafate and baclofen 3 times daily.   Assessment & Plan:   Principal Problem:   GI bleed Active Problems:   Severe sepsis (HCC)   AKI (acute kidney injury) ([facility])   Acute metabolic encephalopathy   Hypokalemia   Encephalopathy   Nausea and vomiting   Coffee ground emesis   Elevated bilirubin   Upper GI bleed/esophagitis, coffee-ground emesis Nausea vomiting, improved  -Hemoglobin remained stable.  CT showed esophagitis.  repeat endoscopy in 11/15 which also showed grade D esophagitis.  we will continue PPI, sucralfate and baclofen.  Nausea and vomiting is improved a little bit this morning.  He is currently tolerating full liquid diet, will attempt to slowly advance it today.  Hypokalemia - Improving, continue repletion as necessary   SIRS; Improved.  -Still no clear evidence of infection.  COVID-19/flu/UA/chest x-ray overall is negative.  Antibiotics have been discontinued.  Clinically monitor him.   AKI on CKD stage IIIa; Resolved -Baseline around 1.5.  Admission creatinine 1.9.     Acute metabolic encephalopathy, resolved -Unclear etiology.  CT head negative, neuro exam is nonfocal.  Ammonia level was unremarkable, B12 normal, TSH normal    New T wave inversions, elevated troponin  - -Troponins remain flat.  Patient is chest pain-free.  Echocardiogram shows normal EF; G1DD.  We will hold off on any further inpatient work-up   Colon cancer  -Outpatient follow-up with Dr Jeffrey Young.  Last chemo was September 2022.   Insulin-dependent type 2 diabetes with hyperglycemia, poorly controlled -Poorly controlled.  Hemoglobin A1c is 11.4.   Essential hypertension- -Continue Norvasc, Coreg resume home antihypertensives, initially they were held in the setting of sepsis  Will attempt to remove Foley catheter today.  DVT prophylaxis: SCDs Start: 04/02/21 0437 Code Status: Full Family Communication:    Status is: Inpatient  Remains inpatient appropriate because: He is tolerating liquids this morning, will attempt to slowly advance it as necessary.  In the meantime will await GI clearance prior to discharge  Nutritional status  Nutrition Problem: Increased nutrient needs Etiology: cancer and cancer related treatments  Signs/Symptoms: estimated needs  Interventions: Boost Breeze, MVI  Body mass index is 24.8 kg/m.           Subjective: During my visit this morning patient said his nausea vomiting was better.  He had an episode of nausea overnight which has since subsided.  Review of Systems Otherwise negative except as per HPI, including: General: Denies fever, chills, night sweats or unintended weight loss. Resp: Denies cough, wheezing, shortness of breath. Cardiac: Denies chest pain, palpitations, orthopnea, paroxysmal nocturnal dyspnea. GI: Denies abdominal pain, nausea, vomiting, diarrhea or constipation GU: Denies dysuria, frequency, hesitancy or incontinence MS: Denies muscle aches, joint pain or swelling Neuro: Denies headache, neurologic deficits (focal weakness, numbness, tingling), abnormal gait Psych: Denies anxiety, depression, SI/HI/AVH Skin: Denies new rashes or lesions  ID: Denies sick contacts, exotic exposures,  travel  Examination: Constitutional: Not in acute distress Respiratory: Clear to auscultation bilaterally Cardiovascular: Normal sinus rhythm, no rubs Abdomen: Nontender nondistended good bowel sounds Musculoskeletal: No edema noted Skin: No rashes seen Neurologic: CN 2-12 grossly intact.  And nonfocal Psychiatric: Normal judgment and insight. Alert and oriented x 3. Normal mood.  Objective: Vitals:   04/07/21 1800 04/07/21 2027 04/08/21 0441 04/08/21 0828  BP:  (!) 160/85 (!) 141/82 (!) 141/66  Pulse:  75 70 67  Resp: 14 18 18    Temp:  99.1 F (37.3 C) 98.7 F (37.1 C)   TempSrc:  Oral Oral   SpO2:  100% 100%   Weight:      Height:        Intake/Output Summary (Last 24 hours) at 04/08/2021 1139 Last data filed at 04/07/2021 2300 Gross per 24 hour  Intake 1080 ml  Output 950 ml  Net 130 ml    Filed Weights   04/01/21 2117 04/05/21 1331  Weight: 85.4 kg 85.3 kg     Data Reviewed:   CBC: Recent Labs  Lab 04/01/21 2130 04/02/21 0558 04/03/21 0436 04/04/21 0433 04/06/21 0542 04/07/21 0331 04/08/21 0338  WBC 15.4*   < > 11.0* 8.3 6.2 5.7 6.7  NEUTROABS 12.7*  --   --   --   --   --   --   HGB 13.0   < > 11.4* 10.7* 9.0* 9.3* 8.9*  HCT 37.6*   < > 33.7* 31.6* 27.0* 27.4* 27.0*  MCV 91.0   < > 94.9 94.6 95.1 94.5 96.1  PLT 255   < > 187 173 168 166 181   < > = values in this interval not displayed.   Basic Metabolic Panel: Recent Labs  Lab 04/02/21 0558 04/03/21 0436 04/04/21 0433 04/05/21 1559 04/06/21 0542 04/07/21 0331 04/08/21 0338  NA 134* 138 136 134* 131* 135 133*  K 3.3* 3.1* 3.2* 3.1* 2.8* 3.3* 3.5  CL 96* 102 102 102 100 104 103  CO2 29 26 26 23 24 24 25   GLUCOSE 150* 127* 117* 150* 159* 152* 265*  BUN 27* 29* 34* 35* 32* 24* 24*  CREATININE 2.09* 1.92* 1.80* 1.73* 1.52* 1.57* 1.68*  CALCIUM 9.3 8.7* 8.3* 8.0* 7.9* 8.3* 8.2*  MG 1.9 1.8  --   --  1.8 2.0 2.0  PHOS  --   --   --   --  3.0  --   --    GFR: Estimated Creatinine  Clearance: 54.8 mL/min (A) (by C-G formula based on SCr of 1.68 mg/dL (H)). Liver Function Tests: Recent Labs  Lab 04/01/21 2130 04/03/21 0436  AST 19 17  ALT 10 9  ALKPHOS 169* 113  BILITOT 1.7* 1.9*  PROT 7.6 6.2*  ALBUMIN 3.4* 2.6*   No results for input(s): LIPASE, AMYLASE in the last 168 hours. Recent Labs  Lab 04/01/21 2140  AMMONIA 6*   Coagulation Profile: Recent Labs  Lab 04/01/21 2130  INR 0.9   Cardiac Enzymes: No results for input(s): CKTOTAL, CKMB, CKMBINDEX, TROPONINI in the last 168 hours. BNP (last 3 results) No results for input(s): PROBNP in the last 8760 hours. HbA1C: No results for input(s): HGBA1C in the last 72 hours. CBG: Recent Labs  Lab 04/07/21 1622 04/07/21 2023 04/07/21 2340 04/08/21 0438 04/08/21 0732  GLUCAP 149* 202* 260* 254* 187*   Lipid Profile: No results for input(s): CHOL, HDL, LDLCALC, TRIG, CHOLHDL, LDLDIRECT in the last 72  hours. Thyroid Function Tests: No results for input(s): TSH, T4TOTAL, FREET4, T3FREE, THYROIDAB in the last 72 hours. Anemia Panel: No results for input(s): VITAMINB12, FOLATE, FERRITIN, TIBC, IRON, RETICCTPCT in the last 72 hours. Sepsis Labs: Recent Labs  Lab 04/01/21 2140 04/01/21 2333 04/02/21 0558  LATICACIDVEN 2.4* 2.1* 1.4    Recent Results (from the past 240 hour(s))  Resp Panel by RT-PCR (Flu A&B, Covid) Nasopharyngeal Swab     Status: None   Collection Time: 04/01/21  9:39 PM   Specimen: Nasopharyngeal Swab; Nasopharyngeal(NP) swabs in vial transport medium  Result Value Ref Range Status   SARS Coronavirus 2 by RT PCR NEGATIVE NEGATIVE Final    Comment: (NOTE) SARS-CoV-2 target nucleic acids are NOT DETECTED.  The SARS-CoV-2 RNA is generally detectable in upper respiratory specimens during the acute phase of infection. The lowest concentration of SARS-CoV-2 viral copies this assay can detect is 138 copies/mL. A negative result does not preclude SARS-Cov-2 infection and should not  be used as the sole basis for treatment or other patient management decisions. A negative result may occur with  improper specimen collection/handling, submission of specimen other than nasopharyngeal swab, presence of viral mutation(s) within the areas targeted by this assay, and inadequate number of viral copies(<138 copies/mL). A negative result must be combined with clinical observations, patient history, and epidemiological information. The expected result is Negative.  Fact Sheet for Patients:  EntrepreneurPulse.com.au  Fact Sheet for Healthcare Providers:  IncredibleEmployment.be  This test is no t yet approved or cleared by the [place] FDA and  has been authorized for detection and/or diagnosis of SARS-CoV-2 by FDA under an Emergency Use Authorization (EUA). This EUA will remain  in effect (meaning this test can be used) for the duration of the COVID-19 declaration under Section 564(b)(1) of the Act, 21 U.S.C.section 360bbb-3(b)(1), unless the authorization is terminated  or revoked sooner.       Influenza A by PCR NEGATIVE NEGATIVE Final   Influenza B by PCR NEGATIVE NEGATIVE Final    Comment: (NOTE) The Xpert Xpress SARS-CoV-2/FLU/RSV plus assay is intended as an aid in the diagnosis of influenza from Nasopharyngeal swab specimens and should not be used as a sole basis for treatment. Nasal washings and aspirates are unacceptable for Xpert Xpress SARS-CoV-2/FLU/RSV testing.  Fact Sheet for Patients: EntrepreneurPulse.com.au  Fact Sheet for Healthcare Providers: IncredibleEmployment.be  This test is not yet approved or cleared by the [place] FDA and has been authorized for detection and/or diagnosis of SARS-CoV-2 by FDA under an Emergency Use Authorization (EUA). This EUA will remain in effect (meaning this test can be used) for the duration of the COVID-19 declaration under Section  564(b)(1) of the Act, 21 U.S.C. section 360bbb-3(b)(1), unless the authorization is terminated or revoked.  Performed at [facility], [place] [address] [place], [place] [address]   Blood Culture (routine x 2)     Status: None   Collection Time: 04/01/21  9:48 PM   Specimen: BLOOD  Result Value Ref Range Status   Specimen Description   Final    BLOOD LEFT ANTECUBITAL Performed at [facility] [address], [place], [place] [address]    Special Requests   Final    BOTTLES DRAWN AEROBIC AND ANAEROBIC Blood Culture results may not be optimal due to an excessive volume of blood received in culture bottles Performed at [facility] [address] [place], [place] [address]    Culture   Final  NO GROWTH 5 DAYS Performed at [facility], [place] [address] [place], [place] [address]    Report Status 04/07/2021 FINAL  Final  Blood Culture (routine x 2)     Status: None   Collection Time: 04/01/21 10:50 PM   Specimen: BLOOD  Result Value Ref Range Status   Specimen Description   Final    BLOOD BLOOD LEFT FOREARM Performed at [facility] [address], [place], [place] [address]    Special Requests   Final    BOTTLES DRAWN AEROBIC AND ANAEROBIC Blood Culture results may not be optimal due to an excessive volume of blood received in culture bottles Performed at [place] [address], [place], [place] [address]    Culture   Final    NO GROWTH 5 DAYS Performed at [facility], [place] [address], [place], [place] [address]    Report Status 04/07/2021 FINAL  Final  Urine Culture     Status: None   Collection Time: 04/01/21 11:40 PM   Specimen: In/Out Cath Urine  Result Value Ref Range Status   Specimen Description   Final    IN/OUT CATH URINE Performed at [facility] [address], [place], [place] [address]    Special Requests   Final     NONE Performed at [facility], [place] [address] [place], [place] [address]    Culture   Final    NO GROWTH Performed at [facility], [place] [address], [place], [place] [address]    Report Status 04/03/2021 FINAL  Final         Radiology Studies: DG Abd 1 View  Result Date: 04/07/2021 CLINICAL DATA:  Abdominal distension. EXAM: ABDOMEN - 1 VIEW COMPARISON:  CT 04/02/2021 FINDINGS: No bowel dilatation to suggest obstruction. Moderate stool in the ascending and transverse colon, small volume of distal stool. Enteric sutures at the splenic flexure. No radiopaque calculi or abnormal soft tissue calcifications. No evidence of free air. Unremarkable osseous structures. IMPRESSION: No explanation for abdominal distension. No bowel obstruction. Small to moderate colonic stool burden. Electronically Signed   By: Keith Rake M.D.   On: 04/07/2021 15:33        Scheduled Meds:  amLODipine  5 mg Oral Daily   baclofen  10 mg Oral TID   carvedilol  3.125 mg Oral BID WC   Chlorhexidine Gluconate Cloth  6 each Topical Daily   feeding supplement  1 Container Oral TID BM   insulin aspart  0-9 Units Subcutaneous Q4H   insulin glargine-yfgn  10 Units Subcutaneous Daily   multivitamin with minerals  1 tablet Oral Daily   pantoprazole  40 mg Oral BID   sodium chloride flush  10-40 mL Intracatheter Q12H   sodium chloride flush  10-40 mL Intracatheter Q12H   sucralfate  1 g Oral TID WC & HS   Continuous Infusions:  0.9 % NaCl with KCl 20 mEq / L 75 mL/hr at 04/08/21 1032     LOS: 6 days   Time spent= 35 mins    Johnni Wunschel Arsenio Loader, MD [facility]  If 7PM-7AM, please contact night-coverage  04/08/2021, 11:39 AM

## 2021-04-09 LAB — BASIC METABOLIC PANEL
Anion gap: 4 — ABNORMAL LOW (ref 5–15)
BUN: 16 mg/dL (ref 6–20)
CO2: 24 mmol/L (ref 22–32)
Calcium: 7.8 mg/dL — ABNORMAL LOW (ref 8.9–10.3)
Chloride: 107 mmol/L (ref 98–111)
Creatinine, Ser: 1.39 mg/dL — ABNORMAL HIGH (ref 0.61–1.24)
GFR, Estimated: 59 mL/min — ABNORMAL LOW (ref >60)
Glucose, Bld: 211 mg/dL — ABNORMAL HIGH (ref 70–99)
Potassium: 4.3 mmol/L (ref 3.5–5.1)
Sodium: 135 mmol/L (ref 135–145)

## 2021-04-09 LAB — GLUCOSE, CAPILLARY
Glucose-Capillary: 199 mg/dL — ABNORMAL HIGH (ref 70–99)
Glucose-Capillary: 218 mg/dL — ABNORMAL HIGH (ref 70–99)
Glucose-Capillary: 172 mg/dL — ABNORMAL HIGH (ref 70–99)
Glucose-Capillary: 197 mg/dL — ABNORMAL HIGH (ref 70–99)
Glucose-Capillary: 152 mg/dL — ABNORMAL HIGH (ref 70–99)

## 2021-04-09 LAB — CBC
HCT: 25.1 % — ABNORMAL LOW (ref 39.0–52.0)
Hemoglobin: 8.1 g/dL — ABNORMAL LOW (ref 13.0–17.0)
MCH: 31.4 pg (ref 26.0–34.0)
MCHC: 32.3 g/dL (ref 30.0–36.0)
MCV: 97.3 fL (ref 80.0–100.0)
Platelets: 157 10*3/uL (ref 150–400)
RBC: 2.58 MIL/uL — ABNORMAL LOW (ref 4.22–5.81)
RDW: 12.5 % (ref 11.5–15.5)
WBC: 6.7 10*3/uL (ref 4.0–10.5)
nRBC: 0 % (ref 0.0–0.2)

## 2021-04-09 LAB — MAGNESIUM: Magnesium: 2.1 mg/dL (ref 1.7–2.4)

## 2021-04-09 MED ORDER — SODIUM CHLORIDE 0.9 % IV SOLN
12.5000 mg | Freq: Four times a day (QID) | INTRAVENOUS | Status: DC | PRN
  Administered 2021-04-14: 12.5 mg via INTRAVENOUS
  Filled 2021-04-09: qty 0.5
  Filled 2021-04-09: qty 12.5

## 2021-04-09 NOTE — Progress Notes (Signed)
PROGRESS NOTE    Jeffrey Young  QVZ:[id number] DOB: Dec 03, 1965 DOA: 04/01/2021 PCP: [name], MD   Brief Narrative:  Jeffrey Young-year-old with history of colon cancer, HTN, HLD, insulin-dependent DM2, diastolic CHF, CKD stage III yea brought to the hospital for confusion and coffee-ground emesis.  Initial work-up was unremarkable for any infection.  CT of the abdomen pelvis showed esophageal thickening concerning for infection versus inflammatory.  Head CT was negative without any meningeal signs.  He was placed on broad-spectrum antibiotics, GI was consulted.  EGD performed on 11/15 showed grade D esophagitis without evidence of active bleeding in the normal stomach.  He was started on Carafate and baclofen 3 times daily.  04/09/2021: Patient was seen and examined at his bedside.  States he does not feel good.  Feels nauseous, vomited x3 times today despite IV Zofran.  IV Phenergan as needed added.     Assessment & Plan:   Principal Problem:   GI bleed Active Problems:   Severe sepsis (HCC)   AKI (acute kidney injury) ([place])   Acute metabolic encephalopathy   Hypokalemia   Encephalopathy   Nausea and vomiting   Coffee ground emesis   Elevated bilirubin   Malignant neoplasm of colon (HCC)   Upper GI bleed/esophagitis, coffee-ground emesis Intractable nausea and vomiting. CT showed esophagitis.  repeat endoscopy in 11/15 which also showed grade D esophagitis.  Continue PPI, he is on p.o. Protonix 40 mg twice daily. Continue sucralfate and baclofen.   IV antiemetics as needed IV Phenergan added for intractable nausea and vomiting. Hemoglobin 8.1 from 8.9 Continue to monitor H&H  Resolved post repletion: Hypokalemia Serum potassium 4.3, from previous 2.8.   SIRS; criteria has resolved. -Still no clear evidence of infection.  COVID-19/flu/UA/chest x-ray overall is negative.  Antibiotics have been discontinued.   Continue to closely monitor.  Resolving AKI on CKD stage  IIIa; Appears to be back to his baseline creatinine 1.3 with GFR 59 Creatinine previously 1.80. Continue to avoid nephrotoxic agents, hypotension, and dehydration.   Resolved acute metabolic encephalopathy -Unclear etiology.  CT head negative, neuro exam is nonfocal.  Ammonia level was unremarkable, B12 normal, TSH normal He is back to his baseline mentation.    New T wave inversions, elevated troponin - -Troponins remain flat.  Patient is chest pain-free.  Echocardiogram shows normal EF; G1DD.  We will hold off on any further inpatient work-up Denies any anginal symptoms at the time of this visit.   Colon cancer  -Outpatient follow-up with Dr [name].   Last chemo was September 2022.   Insulin-dependent type 2 diabetes with hyperglycemia, poorly controlled -Poorly controlled.  Hemoglobin A1c is 11.4. Continue insulin sliding scale.   Essential hypertension- BP stable -Continue Norvasc, Coreg Continue home antihypertensives, initially they were held in the setting of sepsis   DVT prophylaxis: SCDs Start: 04/02/21 0437 Code Status: Full Family Communication:    Status is: Inpatient  Remains inpatient appropriate because: He is tolerating liquids this morning, will attempt to slowly advance it as necessary.  In the meantime will await GI clearance prior to discharge  Nutritional status  Nutrition Problem: Increased nutrient needs Etiology: cancer and cancer related treatments  Signs/Symptoms: estimated needs  Interventions: Glucerna shake, Premier Protein, MVI  Body mass index is 24.8 kg/m.    Examination: Constitutional: Well-developed well-nourished in no acute distress.  He is alert and oriented x3. Respiratory: Clear to auscultation with no wheezes or rales.  Cardiovascular: Regular rate and rhythm no rubs or gallops.  No JVD or thyromegaly noted. Abdomen: NONTENDER normal bowel sounds present. Musculoskeletal: No lower extremity edema bilaterally. Skin: No  ulcerative lesions seen. Neurologic: Nonfocal exam.  Alert and awake.  Moves all 4 extremities equally.   Psychiatric: Mood is appropriate for condition and setting.  Objective: Vitals:   04/08/21 1653 04/08/21 2020 04/09/21 0401 04/09/21 1423  BP: (!) 169/95 (!) 157/89 131/79 (!) 148/81  Pulse: 75 73 70 73  Resp:  16 16 16   Temp:  98.8 F (37.1 C) 98.1 F (36.7 C) 99 F (37.2 C)  TempSrc:  Oral Oral Oral  SpO2:  100% 92% 100%  Weight:      Height:        Intake/Output Summary (Last 24 hours) at 04/09/2021 1439 Last data filed at 04/09/2021 0727 Gross per 24 hour  Intake 1649.71 ml  Output 700 ml  Net 949.71 ml    [facility] Weights   04/01/21 2117 04/05/21 1331  Weight: 85.4 kg 85.3 kg     Data Reviewed:   CBC: Recent Labs  Lab 04/04/21 0433 04/06/21 0542 04/07/21 0331 04/08/21 0338 04/09/21 0533  WBC 8.3 6.2 5.7 6.7 6.7  HGB 10.7* 9.0* 9.3* 8.9* 8.1*  HCT 31.6* 27.0* 27.4* 27.0* 25.1*  MCV 94.6 95.1 94.5 96.1 97.3  PLT 173 168 166 181 970   Basic Metabolic Panel: Recent Labs  Lab 04/03/21 0436 04/04/21 0433 04/05/21 1559 04/06/21 0542 04/07/21 0331 04/08/21 0338 04/09/21 0533  NA 138   < > 134* 131* 135 133* 135  K 3.1*   < > 3.1* 2.8* 3.3* 3.5 4.3  CL 102   < > 102 100 104 103 107  CO2 26   < > 23 24 24 25 24   GLUCOSE 127*   < > 150* 159* 152* 265* 211*  BUN 29*   < > 35* 32* 24* 24* 16  CREATININE 1.92*   < > 1.73* 1.52* 1.57* 1.68* 1.39*  CALCIUM 8.7*   < > 8.0* 7.9* 8.3* 8.2* 7.8*  MG 1.8  --   --  1.8 2.0 2.0 2.1  PHOS  --   --   --  3.0  --   --   --    < > = values in this interval not displayed.   GFR: Estimated Creatinine Clearance: 66.3 mL/min (A) (by C-G formula based on SCr of 1.39 mg/dL (H)). Liver Function Tests: Recent Labs  Lab 04/03/21 0436  AST 17  ALT 9  ALKPHOS 113  BILITOT 1.9*  PROT 6.2*  ALBUMIN 2.6*   No results for input(s): LIPASE, AMYLASE in the last 168 hours. No results for input(s): AMMONIA in the last  168 hours.  Coagulation Profile: No results for input(s): INR, PROTIME in the last 168 hours.  Cardiac Enzymes: No results for input(s): CKTOTAL, CKMB, CKMBINDEX, TROPONINI in the last 168 hours. BNP (last 3 results) No results for input(s): PROBNP in the last 8760 hours. HbA1C: No results for input(s): HGBA1C in the last 72 hours. CBG: Recent Labs  Lab 04/08/21 2018 04/08/21 2351 04/09/21 0358 04/09/21 0743 04/09/21 1156  GLUCAP 300* 219* 199* 218* 172*   Lipid Profile: No results for input(s): CHOL, HDL, LDLCALC, TRIG, CHOLHDL, LDLDIRECT in the last 72 hours. Thyroid Function Tests: No results for input(s): TSH, T4TOTAL, FREET4, T3FREE, THYROIDAB in the last 72 hours. Anemia Panel: No results for input(s): VITAMINB12, FOLATE, FERRITIN, TIBC, IRON, RETICCTPCT in the last 72 hours. Sepsis Labs: No results for input(s): PROCALCITON, LATICACIDVEN in  the last 168 hours.   Recent Results (from the past 240 hour(s))  Resp Panel by RT-PCR (Flu A&B, Covid) Nasopharyngeal Swab     Status: None   Collection Time: 04/01/21  9:39 PM   Specimen: Nasopharyngeal Swab; Nasopharyngeal(NP) swabs in vial transport medium  Result Value Ref Range Status   SARS Coronavirus 2 by RT PCR NEGATIVE NEGATIVE Final    Comment: (NOTE) SARS-CoV-2 target nucleic acids are NOT DETECTED.  The SARS-CoV-2 RNA is generally detectable in upper respiratory specimens during the acute phase of infection. The lowest concentration of SARS-CoV-2 viral copies this assay can detect is 138 copies/mL. A negative result does not preclude SARS-Cov-2 infection and should not be used as the sole basis for treatment or other patient management decisions. A negative result may occur with  improper specimen collection/handling, submission of specimen other than nasopharyngeal swab, presence of viral mutation(s) within the areas targeted by this assay, and inadequate number of viral copies(<138 copies/mL). A negative  result must be combined with clinical observations, patient history, and epidemiological information. The expected result is Negative.  Fact Sheet for Patients:  EntrepreneurPulse.com.au  Fact Sheet for Healthcare Providers:  IncredibleEmployment.be  This test is no t yet approved or cleared by the [place] FDA and  has been authorized for detection and/or diagnosis of SARS-CoV-2 by FDA under an Emergency Use Authorization (EUA). This EUA will remain  in effect (meaning this test can be used) for the duration of the COVID-19 declaration under Section 564(b)(1) of the Act, 21 U.S.C.section 360bbb-3(b)(1), unless the authorization is terminated  or revoked sooner.       Influenza A by PCR NEGATIVE NEGATIVE Final   Influenza B by PCR NEGATIVE NEGATIVE Final    Comment: (NOTE) The Xpert Xpress SARS-CoV-2/FLU/RSV plus assay is intended as an aid in the diagnosis of influenza from Nasopharyngeal swab specimens and should not be used as a sole basis for treatment. Nasal washings and aspirates are unacceptable for Xpert Xpress SARS-CoV-2/FLU/RSV testing.  Fact Sheet for Patients: EntrepreneurPulse.com.au  Fact Sheet for Healthcare Providers: IncredibleEmployment.be  This test is not yet approved or cleared by the [place] FDA and has been authorized for detection and/or diagnosis of SARS-CoV-2 by FDA under an Emergency Use Authorization (EUA). This EUA will remain in effect (meaning this test can be used) for the duration of the COVID-19 declaration under Section 564(b)(1) of the Act, 21 U.S.C. section 360bbb-3(b)(1), unless the authorization is terminated or revoked.  Performed at [facility], [place] [address] [place], [place] [address]   Blood Culture (routine x 2)     Status: None   Collection Time: 04/01/21  9:48 PM   Specimen: BLOOD  Result Value Ref Range Status    Specimen Description   Final    BLOOD LEFT ANTECUBITAL Performed at [place] [address], [place], [place] [address]    Special Requests   Final    BOTTLES DRAWN AEROBIC AND ANAEROBIC Blood Culture results may not be optimal due to an excessive volume of blood received in culture bottles Performed at [facility] [address], [place], [place] [address]    Culture   Final    NO GROWTH 5 DAYS Performed at [facility], [place] [address] [place], [place] [address]    Report Status 04/07/2021 FINAL  Final  Blood Culture (routine x 2)     Status: None   Collection Time: 04/01/21 10:50 PM  Specimen: BLOOD  Result Value Ref Range Status   Specimen Description   Final    BLOOD BLOOD LEFT FOREARM Performed at [facility] [address], [place], [place] [address]    Special Requests   Final    BOTTLES DRAWN AEROBIC AND ANAEROBIC Blood Culture results may not be optimal due to an excessive volume of blood received in culture bottles Performed at [facility] [address], [place], [place] [address]    Culture   Final    NO GROWTH 5 DAYS Performed at [facility], [place] [address] [place], [place] [address]    Report Status 04/07/2021 FINAL  Final  Urine Culture     Status: None   Collection Time: 04/01/21 11:40 PM   Specimen: In/Out Cath Urine  Result Value Ref Range Status   Specimen Description   Final    IN/OUT CATH URINE Performed at [facility] [address], [place], [place] [address]    Special Requests   Final    NONE Performed at [facility], [place] [address], [place],  [address]    Culture   Final    NO GROWTH Performed at [facility], [place] [address], [place],  [address]    Report Status 04/03/2021 FINAL  Final         Radiology Studies: DG Abd 1 View  Result Date: 04/07/2021 CLINICAL DATA:   Abdominal distension. EXAM: ABDOMEN - 1 VIEW COMPARISON:  CT 04/02/2021 FINDINGS: No bowel dilatation to suggest obstruction. Moderate stool in the ascending and transverse colon, small volume of distal stool. Enteric sutures at the splenic flexure. No radiopaque calculi or abnormal soft tissue calcifications. No evidence of free air. Unremarkable osseous structures. IMPRESSION: No explanation for abdominal distension. No bowel obstruction. Small to moderate colonic stool burden. Electronically Signed   By: Keith Rake M.D.   On: 04/07/2021 15:33   CT CHEST WO CONTRAST  Result Date: 04/08/2021 CLINICAL DATA:  Gastrointestinal cancer EXAM: CT CHEST WITHOUT CONTRAST TECHNIQUE: Multidetector CT imaging of the chest was performed following the standard protocol without IV contrast. COMPARISON:  04/02/2021, 12/03/2020 FINDINGS: Cardiovascular: Unenhanced imaging of the heart and great vessels demonstrates no pericardial effusion. Normal caliber of the thoracic aorta. Left chest wall port via subclavian approach, tip within the superior vena cava. Evaluation of the vascular lumen is limited without IV contrast. Mediastinum/Nodes: No enlarged mediastinal or axillary lymph nodes. Thyroid gland, trachea, and esophagus demonstrate no significant findings. Lungs/Pleura: No acute airspace disease, effusion, or pneumothorax. Scattered scarring versus subsegmental atelectasis at the lung bases, stable. Central airways are patent. Upper Abdomen: No acute abnormality. Musculoskeletal: No acute or destructive bony lesions. Reconstructed images demonstrate no additional findings. IMPRESSION: 1. No evidence of intrathoracic metastases. No acute intrathoracic process. Electronically Signed   By: Randa Ngo M.D.   On: 04/08/2021 19:23        Scheduled Meds:  amLODipine  5 mg Oral Daily   baclofen  10 mg Oral TID   carvedilol  3.125 mg Oral BID WC   Chlorhexidine Gluconate Cloth  6 each Topical Daily   feeding  supplement (GLUCERNA SHAKE)  237 mL Oral TID BM   insulin aspart  0-9 Units Subcutaneous Q4H   insulin glargine-yfgn  10 Units Subcutaneous Daily   multivitamin with minerals  1 tablet Oral Daily   pantoprazole  40 mg Oral BID   Ensure Max Protein  11 oz  Oral Daily   sodium chloride flush  10-40 mL Intracatheter Q12H   sodium chloride flush  10-40 mL Intracatheter Q12H   sucralfate  1 g Oral TID WC & HS   Continuous Infusions:  promethazine (PHENERGAN) injection (IM or IVPB)       LOS: 7 days   Time spent= 35 mins    Kayleen Memos, MD [facility]  If 7PM-7AM, please contact night-coverage  04/09/2021, 2:39 PM

## 2021-04-09 NOTE — Evaluation (Addendum)
Physical Therapy Evaluation Patient Details Name: Jeffrey Young MRN: [id number] DOB: 1965/08/16 Today's Date: 04/09/2021  History of Present Illness  Jeffrey Young with history of colon cancer, HTN, HLD, insulin-dependent DM2, diastolic CHF, CKD , transmetatarsal  amputation left fot. brought to the hospital for confusion and coffee-ground emesis. on 04/01/2021. EGD- esophogitis.  Clinical Impression  Patient reports eager to ambulate and go home. Patient sitting on bed edge for ~ 5 minutes conversing with therapist  with sudden onset of vomiting liquids. Patient proceeded to vomit 2 more times. Finally able to stand  and take steps to recliner. RN in Room. MD notified.  Patient will benefit from PT for increased mobility. Patient has been in hspital x 7 days. Pt admitted with above diagnosis.  Pt currently with functional limitations due to the deficits listed below (see PT Problem List). Pt will benefit from skilled PT to increase their independence and safety with mobility to allow discharge to the venue listed below.        Recommendations for follow up therapy are one component of a multi-disciplinary discharge planning process, led by the attending physician.  Recommendations may be updated based on patient status, additional functional criteria and insurance authorization.  Follow Up Recommendations Home health PT    Assistance Recommended at Discharge None  Functional Status Assessment Patient has had a recent decline in their functional status and demonstrates the ability to make significant improvements in function in a reasonable and predictable amount of time.  Equipment Recommendations  None recommended by PT    Recommendations for Other Services       Precautions / Restrictions Precautions Precaution Comments: may vomit, have a bag ready      Mobility  Bed Mobility Overal bed mobility: Modified Independent                  Transfers Overall transfer level:  Needs assistance Equipment used: Rolling walker (2 wheels) Transfers: Sit to/from Stand;Bed to chair/wheelchair/BSC Sit to Stand: Supervision   Step pivot transfers: Min guard       General transfer comment: patient having emesis  so only to recliner    Ambulation/Gait               General Gait Details: TBA  Stairs            Wheelchair Mobility    Modified Rankin (Stroke Patients Only)       Balance Overall balance assessment: Mild deficits observed, not formally tested                                           Pertinent Vitals/Pain Pain Assessment: No/denies pain    Home Living Family/patient expects to be discharged to:: Private residence Living Arrangements: Spouse/significant other Available Help at Discharge: Family Type of Home: House Home Access: Stairs to enter Entrance Stairs-Rails: None Entrance Stairs-Number of Steps: 2, no rail   Home Layout: One level Home Equipment: Conservation officer, nature (2 wheels);Cane - single point;Wheelchair - manual Additional Comments: has a Left brace with prosthetic shoe    Prior Function Prior Level of Function : Independent/Modified Independent                     Hand Dominance   Dominant Hand: Left    Extremity/Trunk Assessment   Upper Extremity Assessment Upper Extremity Assessment: Overall WFL for tasks assessed (intrinsic atrophy)  Lower Extremity Assessment Lower Extremity Assessment: Overall WFL for tasks assessed;LLE deficits/detail LLE Deficits / Details: trans met amp    Cervical / Trunk Assessment Cervical / Trunk Assessment: Normal  Communication   Communication: No difficulties  Cognition Arousal/Alertness: Awake/alert Behavior During Therapy: WFL for tasks assessed/performed Overall Cognitive Status: Within Functional Limits for tasks assessed                                          General Comments      Exercises      Assessment/Plan    PT Assessment Patient needs continued PT services  PT Problem List Decreased strength;Decreased mobility;Decreased activity tolerance;Decreased knowledge of precautions       PT Treatment Interventions DME instruction;Therapeutic activities;Gait training;Therapeutic exercise;Patient/family education;Functional mobility training    PT Goals (Current goals can be found in the Care Plan section)  Acute Rehab PT Goals Patient Stated Goal: I want to go home PT Goal Formulation: With patient Time For Goal Achievement: 04/23/21 Potential to Achieve Goals: Good    Frequency Min 3X/week   Barriers to discharge        Co-evaluation               AM-PAC PT "6 Clicks" Mobility  Outcome Measure Help needed turning from your back to your side while in a flat bed without using bedrails?: None Help needed moving from lying on your back to sitting on the side of a flat bed without using bedrails?: None Help needed moving to and from a bed to a chair (including a wheelchair)?: A Little Help needed standing up from a chair using your arms (e.g., wheelchair or bedside chair)?: A Little Help needed to walk in hospital room?: A Little Help needed climbing 3-5 steps with a railing? : A Little 6 Click Score: 20    End of Session   Activity Tolerance: Treatment limited secondary to medical complications (Comment) (emesis x 3) Patient left: in chair;with call bell/phone within reach;with nursing/sitter in room Nurse Communication: Mobility status , emesis  PT Visit Diagnosis: Unsteadiness on feet (R26.81)    Time: 1245-1320 PT Time Calculation (min) (ACUTE ONLY): 35 min   Charges:   PT Evaluation $PT Eval Low Complexity: 1 Low PT Treatments $Therapeutic Activity: 8-22 mins        [place] Pager [phone] Office [phone]   Claretha Cooper 04/09/2021, 1:30 PM

## 2021-04-10 ENCOUNTER — Inpatient Hospital Stay (HOSPITAL_COMMUNITY): Payer: 59

## 2021-04-10 LAB — GLUCOSE, CAPILLARY
Glucose-Capillary: 103 mg/dL — ABNORMAL HIGH (ref 70–99)
Glucose-Capillary: 110 mg/dL — ABNORMAL HIGH (ref 70–99)
Glucose-Capillary: 120 mg/dL — ABNORMAL HIGH (ref 70–99)
Glucose-Capillary: 127 mg/dL — ABNORMAL HIGH (ref 70–99)
Glucose-Capillary: 141 mg/dL — ABNORMAL HIGH (ref 70–99)
Glucose-Capillary: 142 mg/dL — ABNORMAL HIGH (ref 70–99)
Glucose-Capillary: 79 mg/dL (ref 70–99)

## 2021-04-10 LAB — CBC
HCT: 27.1 % — ABNORMAL LOW (ref 39.0–52.0)
Hemoglobin: 9 g/dL — ABNORMAL LOW (ref 13.0–17.0)
MCH: 32.4 pg (ref 26.0–34.0)
MCHC: 33.2 g/dL (ref 30.0–36.0)
MCV: 97.5 fL (ref 80.0–100.0)
Platelets: 194 10*3/uL (ref 150–400)
RBC: 2.78 MIL/uL — ABNORMAL LOW (ref 4.22–5.81)
RDW: 12.7 % (ref 11.5–15.5)
WBC: 6.6 10*3/uL (ref 4.0–10.5)
nRBC: 0 % (ref 0.0–0.2)

## 2021-04-10 LAB — BASIC METABOLIC PANEL
Anion gap: 5 (ref 5–15)
BUN: 13 mg/dL (ref 6–20)
CO2: 27 mmol/L (ref 22–32)
Calcium: 8.6 mg/dL — ABNORMAL LOW (ref 8.9–10.3)
Chloride: 105 mmol/L (ref 98–111)
Creatinine, Ser: 1.43 mg/dL — ABNORMAL HIGH (ref 0.61–1.24)
GFR, Estimated: 57 mL/min — ABNORMAL LOW (ref >60)
Glucose, Bld: 112 mg/dL — ABNORMAL HIGH (ref 70–99)
Potassium: 3.7 mmol/L (ref 3.5–5.1)
Sodium: 137 mmol/L (ref 135–145)

## 2021-04-10 LAB — MAGNESIUM: Magnesium: 2 mg/dL (ref 1.7–2.4)

## 2021-04-10 MED ORDER — SENNOSIDES-DOCUSATE SODIUM 8.6-50 MG PO TABS
2.0000 | ORAL_TABLET | Freq: Two times a day (BID) | ORAL | Status: DC
  Administered 2021-04-10 – 2021-04-15 (×9): 2 via ORAL
  Filled 2021-04-10 (×10): qty 2

## 2021-04-10 NOTE — Progress Notes (Signed)
PROGRESS NOTE    Jeffrey Young  VQX:[id number] DOB: 1965-07-18 DOA: 04/01/2021 PCP: [name], MD   Brief Narrative:  55 year old with history of colon cancer, HTN, HLD, insulin-dependent DM2, diastolic CHF, CKD stage III yea brought to the hospital for confusion and coffee-ground emesis.  Initial work-up was unremarkable for any infection.  CT of the abdomen pelvis showed esophageal thickening concerning for infection versus inflammatory.  Head CT was negative without any meningeal signs.  He was placed on broad-spectrum antibiotics, GI was consulted.  EGD performed on 11/15 showed grade D esophagitis without evidence of active bleeding in the normal stomach.  He was started on Carafate and baclofen 3 times daily.  04/10/2021: Patient was seen and examined at his bedside.  States every time he puts something in his mouth it comes up.  Reports intermittent nausea and vomiting.  No evidence of bowel obstruction or stool impaction on abdominal x-ray.  Ordered a nuclear gastric emptying test due to suspected gastroparesis.  He needs to be off narcotics for at least 48 hours.   Assessment & Plan:   Principal Problem:   GI bleed Active Problems:   Severe sepsis (HCC)   AKI (acute kidney injury) ([facility])   Acute metabolic encephalopathy   Hypokalemia   Encephalopathy   Nausea and vomiting   Coffee ground emesis   Elevated bilirubin   Malignant neoplasm of colon (HCC)   Upper GI bleed/esophagitis, coffee-ground emesis Intractable nausea and vomiting. CT showed esophagitis.  repeat endoscopy in 11/15 which also showed grade D esophagitis.  Continue PPI, he is on p.o. Protonix 40 mg twice daily. Continue sucralfate and baclofen as recommended by GI.   IV antiemetics as needed IV Phenergan added for intractable nausea and vomiting. Hemoglobin uptrending 9.0 from 8.1 from 8.9 Continue to monitor H&H  Intractable nausea and vomiting with concern for gastroparesis Nuclear gastric  emptying test ordered on 04/10/2021, follow results Continue supportive care He needs to be off narcotics for at least 48 hours.  LA Grade D reflux esophagitis with no bleeding. Management per GI as stated above.  Resolved post repletion: Hypokalemia Serum potassium 4.3, from previous 2.8.   SIRS; criteria has resolved. -Still no clear evidence of infection.  COVID-19/flu/UA/chest x-ray overall is negative.  Antibiotics have been discontinued.   Continue to closely monitor.  Resolving AKI on CKD stage IIIa; Appears to be back to his baseline creatinine 1.3 with GFR 59 Creatinine previously 1.80. Continue to avoid nephrotoxic agents, hypotension, and dehydration.   Resolved acute metabolic encephalopathy -Unclear etiology.  CT head negative, neuro exam is nonfocal.  Ammonia level was unremarkable, B12 normal, TSH normal He is back to his baseline mentation.    New T wave inversions, elevated troponin - -Troponins remain flat.  Patient is chest pain-free.  Echocardiogram shows normal EF; G1DD.  We will hold off on any further inpatient work-up Denies any anginal symptoms at the time of this visit.   Colon cancer  -Outpatient follow-up with Dr [name].   Last chemo was September 2022.   Insulin-dependent type 2 diabetes with hyperglycemia, poorly controlled -Poorly controlled.  Hemoglobin A1c is 11.4. Continue insulin sliding scale.   Essential hypertension- BP stable -Continue Norvasc, Coreg Continue home antihypertensives, initially they were held in the setting of sepsis  Generalized weakness/physical debility PT assessed with recommendation for home health PT TOC consulted to assist with home health PT arrangement Continue fall precautions.   DVT prophylaxis: SCDs Start: 04/02/21 0437 Code Status: Full Family Communication:  Status is: Inpatient  Remains inpatient appropriate because: He is tolerating liquids this morning, will attempt to slowly advance it as  necessary.  In the meantime will await GI clearance prior to discharge  Nutritional status  Nutrition Problem: Increased nutrient needs Etiology: cancer and cancer related treatments  Signs/Symptoms: estimated needs  Interventions: Glucerna shake, Premier Protein, MVI  Body mass index is 24.8 kg/m.    Examination: Constitutional: Well-developed well-nourished in no acute distress.  He is alert and oriented x3.   Respiratory: Clear to auscultation no wheezes or rales.  Cardiovascular: Regular rate and rhythm no rubs or gallops.  Abdomen: Nontender normal bowel sounds present. Musculoskeletal: No lower extremity edema bilaterally.   Skin: No ulcerative lesions seen. Neurologic: Nonfocal exam.  Alert and awake.   Psychiatric: Mood is appropriate for condition and setting.    Objective: Vitals:   04/09/21 1423 04/10/21 0313 04/10/21 0938 04/10/21 1344  BP: (!) 148/81 (!) 157/88  133/74  Pulse: 73 73  72  Resp: 16 16 12 16   Temp: 99 F (37.2 C) 98.8 F (37.1 C)  98.5 F (36.9 C)  TempSrc: Oral Oral  Oral  SpO2: 100% 100%  99%  Weight:      Height:        Intake/Output Summary (Last 24 hours) at 04/10/2021 1453 Last data filed at 04/10/2021 1244 Gross per 24 hour  Intake --  Output 600 ml  Net -600 ml    [facility] Weights   04/01/21 2117 04/05/21 1331  Weight: 85.4 kg 85.3 kg     Data Reviewed:   CBC: Recent Labs  Lab 04/06/21 0542 04/07/21 0331 04/08/21 0338 04/09/21 0533 04/10/21 0353  WBC 6.2 5.7 6.7 6.7 6.6  HGB 9.0* 9.3* 8.9* 8.1* 9.0*  HCT 27.0* 27.4* 27.0* 25.1* 27.1*  MCV 95.1 94.5 96.1 97.3 97.5  PLT 168 166 181 157 299   Basic Metabolic Panel: Recent Labs  Lab 04/06/21 0542 04/07/21 0331 04/08/21 0338 04/09/21 0533 04/10/21 0353  NA 131* 135 133* 135 137  K 2.8* 3.3* 3.5 4.3 3.7  CL 100 104 103 107 105  CO2 24 24 25 24 27   GLUCOSE 159* 152* 265* 211* 112*  BUN 32* 24* 24* 16 13  CREATININE 1.52* 1.57* 1.68* 1.39* 1.43*  CALCIUM 7.9*  8.3* 8.2* 7.8* 8.6*  MG 1.8 2.0 2.0 2.1 2.0  PHOS 3.0  --   --   --   --    GFR: Estimated Creatinine Clearance: 64.4 mL/min (A) (by C-G formula based on SCr of 1.43 mg/dL (H)). Liver Function Tests: No results for input(s): AST, ALT, ALKPHOS, BILITOT, PROT, ALBUMIN in the last 168 hours.  No results for input(s): LIPASE, AMYLASE in the last 168 hours. No results for input(s): AMMONIA in the last 168 hours.  Coagulation Profile: No results for input(s): INR, PROTIME in the last 168 hours.  Cardiac Enzymes: No results for input(s): CKTOTAL, CKMB, CKMBINDEX, TROPONINI in the last 168 hours. BNP (last 3 results) No results for input(s): PROBNP in the last 8760 hours. HbA1C: No results for input(s): HGBA1C in the last 72 hours. CBG: Recent Labs  Lab 04/09/21 2052 04/10/21 0007 04/10/21 0311 04/10/21 0743 04/10/21 1219  GLUCAP 152* 79 103* 127* 110*   Lipid Profile: No results for input(s): CHOL, HDL, LDLCALC, TRIG, CHOLHDL, LDLDIRECT in the last 72 hours. Thyroid Function Tests: No results for input(s): TSH, T4TOTAL, FREET4, T3FREE, THYROIDAB in the last 72 hours. Anemia Panel: No results for input(s): VITAMINB12,  FOLATE, FERRITIN, TIBC, IRON, RETICCTPCT in the last 72 hours. Sepsis Labs: No results for input(s): PROCALCITON, LATICACIDVEN in the last 168 hours.   Recent Results (from the past 240 hour(s))  Resp Panel by RT-PCR (Flu A&B, Covid) Nasopharyngeal Swab     Status: None   Collection Time: 04/01/21  9:39 PM   Specimen: Nasopharyngeal Swab; Nasopharyngeal(NP) swabs in vial transport medium  Result Value Ref Range Status   SARS Coronavirus 2 by RT PCR NEGATIVE NEGATIVE Final    Comment: (NOTE) SARS-CoV-2 target nucleic acids are NOT DETECTED.  The SARS-CoV-2 RNA is generally detectable in upper respiratory specimens during the acute phase of infection. The lowest concentration of SARS-CoV-2 viral copies this assay can detect is 138 copies/mL. A negative result  does not preclude SARS-Cov-2 infection and should not be used as the sole basis for treatment or other patient management decisions. A negative result may occur with  improper specimen collection/handling, submission of specimen other than nasopharyngeal swab, presence of viral mutation(s) within the areas targeted by this assay, and inadequate number of viral copies(<138 copies/mL). A negative result must be combined with clinical observations, patient history, and epidemiological information. The expected result is Negative.  Fact Sheet for Patients:  EntrepreneurPulse.com.au  Fact Sheet for Healthcare Providers:  IncredibleEmployment.be  This test is no t yet approved or cleared by the [place] FDA and  has been authorized for detection and/or diagnosis of SARS-CoV-2 by FDA under an Emergency Use Authorization (EUA). This EUA will remain  in effect (meaning this test can be used) for the duration of the COVID-19 declaration under Section 564(b)(1) of the Act, 21 U.S.C.section 360bbb-3(b)(1), unless the authorization is terminated  or revoked sooner.       Influenza A by PCR NEGATIVE NEGATIVE Final   Influenza B by PCR NEGATIVE NEGATIVE Final    Comment: (NOTE) The Xpert Xpress SARS-CoV-2/FLU/RSV plus assay is intended as an aid in the diagnosis of influenza from Nasopharyngeal swab specimens and should not be used as a sole basis for treatment. Nasal washings and aspirates are unacceptable for Xpert Xpress SARS-CoV-2/FLU/RSV testing.  Fact Sheet for Patients: EntrepreneurPulse.com.au  Fact Sheet for Healthcare Providers: IncredibleEmployment.be  This test is not yet approved or cleared by the [place] FDA and has been authorized for detection and/or diagnosis of SARS-CoV-2 by FDA under an Emergency Use Authorization (EUA). This EUA will remain in effect (meaning this test can be used) for  the duration of the COVID-19 declaration under Section 564(b)(1) of the Act, 21 U.S.C. section 360bbb-3(b)(1), unless the authorization is terminated or revoked.  Performed at [facility] [place] [address] [place], [place] [address]   Blood Culture (routine x 2)     Status: None   Collection Time: 04/01/21  9:48 PM   Specimen: BLOOD  Result Value Ref Range Status   Specimen Description   Final    BLOOD LEFT ANTECUBITAL Performed at [facility] [address], [place], [place] [address]    Special Requests   Final    BOTTLES DRAWN AEROBIC AND ANAEROBIC Blood Culture results may not be optimal due to an excessive volume of blood received in culture bottles Performed at [place] [address] [place], [place] [address]    Culture   Final    NO GROWTH 5 DAYS Performed at [facility], [place] [address], [place], [place] [address]    Report Status 04/07/2021 FINAL  Final  Blood  Culture (routine x 2)     Status: None   Collection Time: 04/01/21 10:50 PM   Specimen: BLOOD  Result Value Ref Range Status   Specimen Description   Final    BLOOD BLOOD LEFT FOREARM Performed at [facility] [place] [address], [place], [place] [address]    Special Requests   Final    BOTTLES DRAWN AEROBIC AND ANAEROBIC Blood Culture results may not be optimal due to an excessive volume of blood received in culture bottles Performed at [place] [facility] [address], [place], [place] [address]    Culture   Final    NO GROWTH 5 DAYS Performed at [facility], [place] [address], [place], [place] [address]    Report Status 04/07/2021 FINAL  Final  Urine Culture     Status: None   Collection Time: 04/01/21 11:40 PM   Specimen: In/Out Cath Urine  Result Value Ref Range Status   Specimen Description   Final    IN/OUT CATH URINE Performed at [facility] [address],  [place], [place] [address]    Special Requests   Final    NONE Performed at [facility], [place] [address], [place], [place] [address]    Culture   Final    NO GROWTH Performed at [facility], [place] [address], [place], [place] [address]    Report Status 04/03/2021 FINAL  Final         Radiology Studies: CT CHEST WO CONTRAST  Result Date: 04/08/2021 CLINICAL DATA:  Gastrointestinal cancer EXAM: CT CHEST WITHOUT CONTRAST TECHNIQUE: Multidetector CT imaging of the chest was performed following the standard protocol without IV contrast. COMPARISON:  04/02/2021, 12/03/2020 FINDINGS: Cardiovascular: Unenhanced imaging of the heart and great vessels demonstrates no pericardial effusion. Normal caliber of the thoracic aorta. Left chest wall port via subclavian approach, tip within the superior vena cava. Evaluation of the vascular lumen is limited without IV contrast. Mediastinum/Nodes: No enlarged mediastinal or axillary lymph nodes. Thyroid gland, trachea, and esophagus demonstrate no significant findings. Lungs/Pleura: No acute airspace disease, effusion, or pneumothorax. Scattered scarring versus subsegmental atelectasis at the lung bases, stable. Central airways are patent. Upper Abdomen: No acute abnormality. Musculoskeletal: No acute or destructive bony lesions. Reconstructed images demonstrate no additional findings. IMPRESSION: 1. No evidence of intrathoracic metastases. No acute intrathoracic process. Electronically Signed   By: Randa Ngo M.D.   On: 04/08/2021 19:23   DG Abd Portable 1V  Result Date: 04/10/2021 CLINICAL DATA:  Nausea and vomiting EXAM: PORTABLE ABDOMEN - 1 VIEW COMPARISON:  04/07/2021 FINDINGS: Stomach is not distended. There is no significant small bowel dilation. Moderate amount of stool is seen in the ascending and transverse colon. There is no fecal impaction in the rectosigmoid. Surgical staples are seen in the transverse colon close to the  splenic flexure. IMPRESSION: There is no significant small bowel dilation. Moderate amount of stool is seen in the colon. There is no fecal impaction in the rectosigmoid. Electronically Signed   By: Elmer Picker M.D.   On: 04/10/2021 11:54        Scheduled Meds:  amLODipine  5 mg Oral Daily   baclofen  10 mg Oral TID   carvedilol  3.125 mg Oral BID WC   Chlorhexidine Gluconate Cloth  6 each Topical Daily   feeding supplement (GLUCERNA SHAKE)  237 mL Oral TID BM   insulin aspart  0-9 Units Subcutaneous Q4H  insulin glargine-yfgn  10 Units Subcutaneous Daily   multivitamin with minerals  1 tablet Oral Daily   pantoprazole  40 mg Oral BID   Ensure Max Protein  11 oz Oral Daily   sodium chloride flush  10-40 mL Intracatheter Q12H   sodium chloride flush  10-40 mL Intracatheter Q12H   sucralfate  1 g Oral TID WC & HS   Continuous Infusions:  promethazine (PHENERGAN) injection (IM or IVPB)       LOS: 8 days   Time spent= 35 mins    Kayleen Memos, MD [facility]  If 7PM-7AM, please contact night-coverage  04/10/2021, 2:53 PM

## 2021-04-11 LAB — BASIC METABOLIC PANEL
Anion gap: 5 (ref 5–15)
BUN: 13 mg/dL (ref 6–20)
CO2: 28 mmol/L (ref 22–32)
Calcium: 8.3 mg/dL — ABNORMAL LOW (ref 8.9–10.3)
Chloride: 102 mmol/L (ref 98–111)
Creatinine, Ser: 1.55 mg/dL — ABNORMAL HIGH (ref 0.61–1.24)
GFR, Estimated: 52 mL/min — ABNORMAL LOW (ref >60)
Glucose, Bld: 138 mg/dL — ABNORMAL HIGH (ref 70–99)
Potassium: 3.6 mmol/L (ref 3.5–5.1)
Sodium: 135 mmol/L (ref 135–145)

## 2021-04-11 LAB — CBC
HCT: 26.5 % — ABNORMAL LOW (ref 39.0–52.0)
Hemoglobin: 8.7 g/dL — ABNORMAL LOW (ref 13.0–17.0)
MCH: 31.9 pg (ref 26.0–34.0)
MCHC: 32.8 g/dL (ref 30.0–36.0)
MCV: 97.1 fL (ref 80.0–100.0)
Platelets: 208 10*3/uL (ref 150–400)
RBC: 2.73 MIL/uL — ABNORMAL LOW (ref 4.22–5.81)
RDW: 13.2 % (ref 11.5–15.5)
WBC: 6.6 10*3/uL (ref 4.0–10.5)
nRBC: 0 % (ref 0.0–0.2)

## 2021-04-11 LAB — GLUCOSE, CAPILLARY
Glucose-Capillary: 113 mg/dL — ABNORMAL HIGH (ref 70–99)
Glucose-Capillary: 111 mg/dL — ABNORMAL HIGH (ref 70–99)
Glucose-Capillary: 110 mg/dL — ABNORMAL HIGH (ref 70–99)
Glucose-Capillary: 144 mg/dL — ABNORMAL HIGH (ref 70–99)
Glucose-Capillary: 99 mg/dL (ref 70–99)

## 2021-04-11 LAB — MAGNESIUM: Magnesium: 1.8 mg/dL (ref 1.7–2.4)

## 2021-04-11 MED ORDER — LACTATED RINGERS IV SOLN: INTRAVENOUS | Status: DC

## 2021-04-11 MED ORDER — INSULIN ASPART 100 UNIT/ML IJ SOLN
0.0000 [IU] | Freq: Every day | INTRAMUSCULAR | Status: DC
Start: 2021-04-11 — End: 2021-04-15

## 2021-04-11 MED ORDER — INSULIN ASPART 100 UNIT/ML IJ SOLN
0.0000 [IU] | Freq: Three times a day (TID) | INTRAMUSCULAR | Status: DC
  Administered 2021-04-11 – 2021-04-12 (×2): 2 [IU] via SUBCUTANEOUS
  Administered 2021-04-12 (×2): 3 [IU] via SUBCUTANEOUS
  Administered 2021-04-13: 2 [IU] via SUBCUTANEOUS
  Administered 2021-04-13: 3 [IU] via SUBCUTANEOUS
  Administered 2021-04-14 – 2021-04-15 (×2): 2 [IU] via SUBCUTANEOUS

## 2021-04-11 NOTE — Progress Notes (Signed)
Patient ate 100% of his dinner (crispy baked chicken, mashed potatoes and gravy, cake with icing) and 240 ml tea- pt stated he turned his head to look up at the TV and immediately vomited 500 ml light brown, undigested food/liquid into emesis bag. Pt stated he felt "fine." No complaints. IV Zofran given before meal preventatively. Pt asking for ice cream at this time. Will continue to monitor.

## 2021-04-11 NOTE — Progress Notes (Signed)
PROGRESS NOTE    Jeffrey Young  OVZ:[id number] DOB: 1965/08/19 DOA: 04/01/2021 PCP: [name], MD   Brief Narrative:  55 year old with history of colon cancer, HTN, HLD, insulin-dependent DM2, diastolic CHF, CKD stage III yea brought to the hospital for confusion and coffee-ground emesis.  Initial work-up was unremarkable for any infection.  CT of the abdomen pelvis showed esophageal thickening concerning for infection versus inflammatory.  Head CT was negative without any meningeal signs.  He was placed on broad-spectrum antibiotics, GI was consulted.  EGD performed on 11/15 showed grade D esophagitis without evidence of active bleeding in the normal stomach.  He was started on Carafate and baclofen 3 times daily.  Hospital course complicated by intractable nausea and vomiting.  Nuclear gastric emptying test ordered and pending.  04/11/2021: Patient was seen and examined at bedside.  Reports nausea improved with IV Phenergan.  Started on clear liquid diet, will continue to monitor.   Assessment & Plan:   Principal Problem:   GI bleed Active Problems:   Severe sepsis (HCC)   AKI (acute kidney injury) (Jeffrey Young)   Acute metabolic encephalopathy   Hypokalemia   Encephalopathy   Nausea and vomiting   Coffee ground emesis   Elevated bilirubin   Malignant neoplasm of colon (HCC)   Upper GI bleed/esophagitis, coffee-ground emesis Intractable nausea and vomiting. CT showed esophagitis.  repeat endoscopy in 11/15 which also showed grade D esophagitis.  Continue PPI, he is on p.o. Protonix 40 mg twice daily. Continue sucralfate and baclofen as recommended by GI.   IV antiemetics as needed IV Phenergan added for intractable nausea and vomiting. Hemoglobin uptrending 9.0 from 8.1 from 8.9 Continue to monitor H&H  Intractable nausea and vomiting with concern for gastroparesis Nuclear gastric emptying test ordered on 04/10/2021, pending. He needs to be off narcotics for at least 48  hours. Start clear liquid diet, advance as tolerated. IV antiemetics, as needed  LA Grade D reflux esophagitis with no bleeding. Management per GI as stated above.  Resolved post repletion: Hypokalemia Serum potassium 4.3, from previous 2.8.   SIRS; criteria has resolved. -Still no clear evidence of infection.  COVID-19/flu/UA/chest x-ray overall is negative.  Antibiotics have been discontinued.   Continue to closely monitor.  Nonoliguric AKI on CKD stage IIIa due to poor oral intake, nausea and vomiting; Baseline creatinine appears to be 1.3 with GFR 59 Prior creatinine 1.80 Creatinine is uptrending 1.55 Started IV fluid hydration LR 50 cc/h times daily Continue to avoid nephrotoxic agents, hypotension, and dehydration. Repeat BMP in the morning   Resolved acute metabolic encephalopathy -Unclear etiology.  CT head negative, neuro exam is nonfocal.  Ammonia level was unremarkable, B12 normal, TSH normal He is back to his baseline mentation.    New T wave inversions, elevated troponin - -Troponins remain flat.  Patient is chest pain-free.  Echocardiogram shows normal EF; G1DD.  We will hold off on any further inpatient work-up Denies any anginal symptoms at the time of this visit.   Colon cancer  -Outpatient follow-up with Dr [name].   Last chemo was September 2022.   Insulin-dependent type 2 diabetes with hyperglycemia, poorly controlled -Poorly controlled.  Hemoglobin A1c is 11.4. Continue insulin sliding scale.   Essential hypertension- BP stable -Continue Norvasc, Coreg Continue home antihypertensives, initially they were held in the setting of sepsis  Generalized weakness/physical debility PT assessed with recommendation for home health PT TOC consulted to assist with home health PT arrangement Continue fall precautions.   DVT prophylaxis:  SCDs Start: 04/02/21 0437 Code Status: Full Family Communication:    Status is: Inpatient  Remains inpatient  appropriate because: He is tolerating liquids this morning, will attempt to slowly advance it as necessary.  In the meantime will await GI clearance prior to discharge  Nutritional status  Nutrition Problem: Increased nutrient needs Etiology: cancer and cancer related treatments  Signs/Symptoms: estimated needs  Interventions: Glucerna shake, Premier Protein, MVI  Body mass index is 24.8 kg/m.    Examination: Constitutional: Well-developed well-nourished in no acute distress.  He is alert oriented x3. Respiratory: Clear to auscultation no wheezes or rales.  Cardiovascular: Regular rate and rhythm no rubs or gallops.  Abdomen: Nontender obese normal bowel sounds present. Musculoskeletal: No lower extremity edema bilaterally. Skin: No ulcerative lesions noted.   Neurologic: Nonfocal, alert and oriented.   Psychiatric: Mood is appropriate for condition and setting.  Objective: Vitals:   04/10/21 1344 04/10/21 2009 04/11/21 0522 04/11/21 1145  BP: 133/74 (!) 160/97 (!) 159/89 120/79  Pulse: 72 70 67 68  Resp: 16 16 18 16   Temp: 98.5 F (36.9 C) 98.3 F (36.8 C) 98.3 F (36.8 C) 98.4 F (36.9 C)  TempSrc: Oral Oral Oral Oral  SpO2: 99% 100% 98% 99%  Weight:      Height:        Intake/Output Summary (Last 24 hours) at 04/11/2021 1525 Last data filed at 04/11/2021 1100 Gross per 24 hour  Intake 539 ml  Output 1500 ml  Net -961 ml    [facility] Weights   04/01/21 2117 04/05/21 1331  Weight: 85.4 kg 85.3 kg     Data Reviewed:   CBC: Recent Labs  Lab 04/07/21 0331 04/08/21 0338 04/09/21 0533 04/10/21 0353 04/11/21 0300  WBC 5.7 6.7 6.7 6.6 6.6  HGB 9.3* 8.9* 8.1* 9.0* 8.7*  HCT 27.4* 27.0* 25.1* 27.1* 26.5*  MCV 94.5 96.1 97.3 97.5 97.1  PLT 166 181 157 194 161   Basic Metabolic Panel: Recent Labs  Lab 04/06/21 0542 04/07/21 0331 04/08/21 0338 04/09/21 0533 04/10/21 0353 04/11/21 0300  NA 131* 135 133* 135 137 135  K 2.8* 3.3* 3.5 4.3 3.7 3.6  CL 100  104 103 107 105 102  CO2 24 24 25 24 27 28   GLUCOSE 159* 152* 265* 211* 112* 138*  BUN 32* 24* 24* 16 13 13   CREATININE 1.52* 1.57* 1.68* 1.39* 1.43* 1.55*  CALCIUM 7.9* 8.3* 8.2* 7.8* 8.6* 8.3*  MG 1.8 2.0 2.0 2.1 2.0 1.8  PHOS 3.0  --   --   --   --   --    GFR: Estimated Creatinine Clearance: 59.4 mL/min (A) (by C-G formula based on SCr of 1.55 mg/dL (H)). Liver Function Tests: No results for input(s): AST, ALT, ALKPHOS, BILITOT, PROT, ALBUMIN in the last 168 hours.  No results for input(s): LIPASE, AMYLASE in the last 168 hours. No results for input(s): AMMONIA in the last 168 hours.  Coagulation Profile: No results for input(s): INR, PROTIME in the last 168 hours.  Cardiac Enzymes: No results for input(s): CKTOTAL, CKMB, CKMBINDEX, TROPONINI in the last 168 hours. BNP (last 3 results) No results for input(s): PROBNP in the last 8760 hours. HbA1C: No results for input(s): HGBA1C in the last 72 hours. CBG: Recent Labs  Lab 04/10/21 2007 04/10/21 2304 04/11/21 0519 04/11/21 0737 04/11/21 1144  GLUCAP 141* 142* 113* 111* 110*   Lipid Profile: No results for input(s): CHOL, HDL, LDLCALC, TRIG, CHOLHDL, LDLDIRECT in the last 72 hours.  Thyroid Function Tests: No results for input(s): TSH, T4TOTAL, FREET4, T3FREE, THYROIDAB in the last 72 hours. Anemia Panel: No results for input(s): VITAMINB12, FOLATE, FERRITIN, TIBC, IRON, RETICCTPCT in the last 72 hours. Sepsis Labs: No results for input(s): PROCALCITON, LATICACIDVEN in the last 168 hours.   Recent Results (from the past 240 hour(s))  Resp Panel by RT-PCR (Flu A&B, Covid) Nasopharyngeal Swab     Status: None   Collection Time: 04/01/21  9:39 PM   Specimen: Nasopharyngeal Swab; Nasopharyngeal(NP) swabs in vial transport medium  Result Value Ref Range Status   SARS Coronavirus 2 by RT PCR NEGATIVE NEGATIVE Final    Comment: (NOTE) SARS-CoV-2 target nucleic acids are NOT DETECTED.  The SARS-CoV-2 RNA is generally  detectable in upper respiratory specimens during the acute phase of infection. The lowest concentration of SARS-CoV-2 viral copies this assay can detect is 138 copies/mL. A negative result does not preclude SARS-Cov-2 infection and should not be used as the sole basis for treatment or other patient management decisions. A negative result may occur with  improper specimen collection/handling, submission of specimen other than nasopharyngeal swab, presence of viral mutation(s) within the areas targeted by this assay, and inadequate number of viral copies(<138 copies/mL). A negative result must be combined with clinical observations, patient history, and epidemiological information. The expected result is Negative.  Fact Sheet for Patients:  EntrepreneurPulse.com.au  Fact Sheet for Healthcare Providers:  IncredibleEmployment.be  This test is no t yet approved or cleared by the [place] FDA and  has been authorized for detection and/or diagnosis of SARS-CoV-2 by FDA under an Emergency Use Authorization (EUA). This EUA will remain  in effect (meaning this test can be used) for the duration of the COVID-19 declaration under Section 564(b)(1) of the Act, 21 U.S.C.section 360bbb-3(b)(1), unless the authorization is terminated  or revoked sooner.       Influenza A by PCR NEGATIVE NEGATIVE Final   Influenza B by PCR NEGATIVE NEGATIVE Final    Comment: (NOTE) The Xpert Xpress SARS-CoV-2/FLU/RSV plus assay is intended as an aid in the diagnosis of influenza from Nasopharyngeal swab specimens and should not be used as a sole basis for treatment. Nasal washings and aspirates are unacceptable for Xpert Xpress SARS-CoV-2/FLU/RSV testing.  Fact Sheet for Patients: EntrepreneurPulse.com.au  Fact Sheet for Healthcare Providers: IncredibleEmployment.be  This test is not yet approved or cleared by the [place] FDA  and has been authorized for detection and/or diagnosis of SARS-CoV-2 by FDA under an Emergency Use Authorization (EUA). This EUA will remain in effect (meaning this test can be used) for the duration of the COVID-19 declaration under Section 564(b)(1) of the Act, 21 U.S.C. section 360bbb-3(b)(1), unless the authorization is terminated or revoked.  Performed at [facility], [place] [address] [place], [place] [address]   Blood Culture (routine x 2)     Status: None   Collection Time: 04/01/21  9:48 PM   Specimen: BLOOD  Result Value Ref Range Status   Specimen Description   Final    BLOOD LEFT ANTECUBITAL Performed at [facility] [address] [place], [place] [address]    Special Requests   Final    BOTTLES DRAWN AEROBIC AND ANAEROBIC Blood Culture results may not be optimal due to an excessive volume of blood received in culture bottles Performed at [facility] [address], [place], [place] [address]    Culture   Final    NO GROWTH 5 DAYS  Performed at [facility], [place] [address], [place], [place] [address]    Report Status 04/07/2021 FINAL  Final  Blood Culture (routine x 2)     Status: None   Collection Time: 04/01/21 10:50 PM   Specimen: BLOOD  Result Value Ref Range Status   Specimen Description   Final    BLOOD BLOOD LEFT FOREARM Performed at [facility] [address], [place], [place] [address]    Special Requests   Final    BOTTLES DRAWN AEROBIC AND ANAEROBIC Blood Culture results may not be optimal due to an excessive volume of blood received in culture bottles Performed at [place] [facility] [address], [place], [place] [address]    Culture   Final    NO GROWTH 5 DAYS Performed at [facility], [place] [address], [place], [place] [address]    Report Status 04/07/2021 FINAL  Final  Urine Culture     Status: None   Collection Time: 04/01/21 11:40 PM    Specimen: In/Out Cath Urine  Result Value Ref Range Status   Specimen Description   Final    IN/OUT CATH URINE Performed at [facility] [address], [place], [place] [address]    Special Requests   Final    NONE Performed at [facility], [place] [address] [place], [place] [address]    Culture   Final    NO GROWTH Performed at [facility], [place] [address], [place],  [address]    Report Status 04/03/2021 FINAL  Final         Radiology Studies: DG Abd Portable 1V  Result Date: 04/10/2021 CLINICAL DATA:  Nausea and vomiting EXAM: PORTABLE ABDOMEN - 1 VIEW COMPARISON:  04/07/2021 FINDINGS: Stomach is not distended. There is no significant small bowel dilation. Moderate amount of stool is seen in the ascending and transverse colon. There is no fecal impaction in the rectosigmoid. Surgical staples are seen in the transverse colon close to the splenic flexure. IMPRESSION: There is no significant small bowel dilation. Moderate amount of stool is seen in the colon. There is no fecal impaction in the rectosigmoid. Electronically Signed   By: Elmer Picker M.D.   On: 04/10/2021 11:54        Scheduled Meds:  amLODipine  5 mg Oral Daily   baclofen  10 mg Oral TID   carvedilol  3.125 mg Oral BID WC   Chlorhexidine Gluconate Cloth  6 each Topical Daily   feeding supplement (GLUCERNA SHAKE)  237 mL Oral TID BM   insulin aspart  0-9 Units Subcutaneous Q4H   insulin glargine-yfgn  10 Units Subcutaneous Daily   multivitamin with minerals  1 tablet Oral Daily   pantoprazole  40 mg Oral BID   Ensure Max Protein  11 oz Oral Daily   senna-docusate  2 tablet Oral BID   sodium chloride flush  10-40 mL Intracatheter Q12H   sodium chloride flush  10-40 mL Intracatheter Q12H   sucralfate  1 g Oral TID WC & HS   Continuous Infusions:  lactated ringers 50 mL/hr at 04/11/21 0725   promethazine (PHENERGAN) injection (IM or IVPB)        LOS: 9 days   Time spent= 35 mins    Kayleen Memos, MD [facility]  If 7PM-7AM, please contact night-coverage  04/11/2021, 3:25 PM

## 2021-04-12 LAB — GLUCOSE, CAPILLARY
Glucose-Capillary: 189 mg/dL — ABNORMAL HIGH (ref 70–99)
Glucose-Capillary: 185 mg/dL — ABNORMAL HIGH (ref 70–99)
Glucose-Capillary: 139 mg/dL — ABNORMAL HIGH (ref 70–99)
Glucose-Capillary: 141 mg/dL — ABNORMAL HIGH (ref 70–99)

## 2021-04-12 LAB — CBC
HCT: 26.9 % — ABNORMAL LOW (ref 39.0–52.0)
Hemoglobin: 8.8 g/dL — ABNORMAL LOW (ref 13.0–17.0)
MCH: 32 pg (ref 26.0–34.0)
MCHC: 32.7 g/dL (ref 30.0–36.0)
MCV: 97.8 fL (ref 80.0–100.0)
Platelets: 209 10*3/uL (ref 150–400)
RBC: 2.75 MIL/uL — ABNORMAL LOW (ref 4.22–5.81)
RDW: 13.3 % (ref 11.5–15.5)
WBC: 6 10*3/uL (ref 4.0–10.5)
nRBC: 0 % (ref 0.0–0.2)

## 2021-04-12 LAB — BASIC METABOLIC PANEL
Anion gap: 5 (ref 5–15)
BUN: 15 mg/dL (ref 6–20)
CO2: 28 mmol/L (ref 22–32)
Calcium: 8.3 mg/dL — ABNORMAL LOW (ref 8.9–10.3)
Chloride: 103 mmol/L (ref 98–111)
Creatinine, Ser: 1.76 mg/dL — ABNORMAL HIGH (ref 0.61–1.24)
GFR, Estimated: 45 mL/min — ABNORMAL LOW (ref >60)
Glucose, Bld: 187 mg/dL — ABNORMAL HIGH (ref 70–99)
Potassium: 3.7 mmol/L (ref 3.5–5.1)
Sodium: 136 mmol/L (ref 135–145)

## 2021-04-12 LAB — MAGNESIUM: Magnesium: 1.8 mg/dL (ref 1.7–2.4)

## 2021-04-12 MED ORDER — LACTATED RINGERS IV SOLN: INTRAVENOUS | Status: AC

## 2021-04-12 MED ORDER — POLYETHYLENE GLYCOL 3350 17 G PO PACK: 17.0000 g | PACK | Freq: Every day | ORAL | Status: DC | PRN

## 2021-04-12 NOTE — Progress Notes (Signed)
   04/12/21 1210  Provider Notification  Provider Name/Title Irene Pap MD  Date Provider Notified 04/12/21  Time Provider Notified 1216  Notification Type Page  Notification Reason Other (Comment) (Cardiac Monitor)  Test performed and critical result 5 Runs of Vtach  Date Critical Result Received 04/12/21  Time Critical Result Received 1216  Provider response Other (Comment) (Awaiting response)  Date of Provider Response 04/12/21

## 2021-04-12 NOTE — Progress Notes (Signed)
Physical Therapy Treatment Patient Details Name: Jeffrey Young MRN: [id number] DOB: 07-26-1965 Today's Date: 04/12/2021   History of Present Illness [name] with history of colon cancer, HTN, HLD, insulin-dependent DM2, diastolic CHF, CKD , transmetatarsal  amputation left fot. brought to the hospital for confusion and coffee-ground emesis. on 04/01/2021. EGD- esophogitis.    PT Comments    General Comments: AxO x 3 very pleasant but not feeling well Assisted OOB to amb pt does well. General transfer comment: good safety cognition and use of hands to steady self General Gait Details: wearing B shoes (L AFO) pt tolwerated amb full unit with walker but 1/3 way asked for a trash can.  Pt vomited in hallway (in trash can) while standing.  Pt did amb back to his room after.  "I thought I was past this" pt sounded dissapointed. Removed shoes and Assisted back to bed.  Reported to RN pt vomited.    Recommendations for follow up therapy are one component of a multi-disciplinary discharge planning process, led by the attending physician.  Recommendations may be updated based on patient status, additional functional criteria and insurance authorization.  Follow Up Recommendations  Home health PT     Assistance Recommended at Discharge None  Equipment Recommendations  None recommended by PT    Recommendations for Other Services       Precautions / Restrictions Precautions Precautions: None Precaution Comments: vomited on exersion past 2 sessions Required Braces or Orthoses: Other Brace (L shoe AFO) Restrictions Weight Bearing Restrictions: No     Mobility  Bed Mobility Overal bed mobility: Modified Independent Bed Mobility: Supine to Sit;Sit to Supine     Supine to sit: Modified independent (Device/Increase time) Sit to supine: Supervision   General bed mobility comments: only required increased time    Transfers Overall transfer level: Needs assistance Equipment used:  Rolling walker (2 wheels) Transfers: Sit to/from Stand;Bed to chair/wheelchair/BSC Sit to Stand: Supervision Stand pivot transfers: Supervision         General transfer comment: good safety cognition and use of hands to steady self    Ambulation/Gait Ambulation/Gait assistance: Supervision;Min guard Gait Distance (Feet): 165 Feet Assistive device: Rolling walker (2 wheels) Gait Pattern/deviations: Step-to pattern;Decreased stance time - left Gait velocity: decreased     General Gait Details: wearing B shoes (L AFO) pt tolwerated amb full unit with walker but 1/3 way asked for a trash can.  Pt vomited in hallway (in trash can) while standing.  Pt did amb back to his room after.  "I thought I was past this" pt sounded dissapointed.   Stairs             Wheelchair Mobility    Modified Rankin (Stroke Patients Only)       Balance                                            Cognition Arousal/Alertness: Awake/alert Behavior During Therapy: WFL for tasks assessed/performed Overall Cognitive Status: Within Functional Limits for tasks assessed                                 General Comments: AxO x 3 very pleasant but not feeling well        Exercises      General Comments  Pertinent Vitals/Pain Pain Assessment: No/denies pain    Home Living                          Prior Function            PT Goals (current goals can now be found in the care plan section)      Frequency    Min 3X/week      PT Plan Current plan remains appropriate    Co-evaluation              AM-PAC PT "6 Clicks" Mobility   Outcome Measure  Help needed turning from your back to your side while in a flat bed without using bedrails?: None Help needed moving from lying on your back to sitting on the side of a flat bed without using bedrails?: None Help needed moving to and from a bed to a chair (including a wheelchair)?:  A Little Help needed standing up from a chair using your arms (e.g., wheelchair or bedside chair)?: A Little Help needed to walk in hospital room?: A Little Help needed climbing 3-5 steps with a railing? : A Little 6 Click Score: 20    End of Session   Activity Tolerance: Other (comment) (emesis) Patient left: in bed;with call bell/phone within reach;with bed alarm set Nurse Communication: Mobility status PT Visit Diagnosis: Unsteadiness on feet (R26.81)     Time: 0034-9179 PT Time Calculation (min) (ACUTE ONLY): 29 min  Charges:  $Gait Training: 8-22 mins $Therapeutic Activity: 8-22 mins                     Rica Koyanagi  PTA Acute  Rehabilitation Services Pager      [phone] Office      [phone]

## 2021-04-12 NOTE — Progress Notes (Signed)
PROGRESS NOTE    Jeffrey Young  QIW:[id number] DOB: 1965/06/10 DOA: 04/01/2021 PCP: [name], MD   Brief Narrative:  55 year old with history of colon cancer, HTN, HLD, insulin-dependent DM2, diastolic CHF, CKD stage IIIb  brought to the hospital for confusion and coffee-ground emesis.  Initial work-up was unremarkable for any infection.  CT of the abdomen pelvis showed esophageal thickening concerning for infection versus inflammatory.  Head CT was negative without any meningeal signs.  He was placed on broad-spectrum antibiotics, GI was consulted and EGD performed on 04/05/21, it showed grade D esophagitis without evidence of active bleeding in the normal stomach.  He was started on Carafate and baclofen 3 times daily.  Hospital course complicated by intractable nausea and vomiting.  Nuclear gastric emptying test ordered and pending.  GI reconsulted.  04/12/2021: Patient was seen and examined at his bedside.  Denies any abdominal pain.  He attempted to eat this morning and vomited again.  Message sent to Dr. Benson Norway via secure chat for further recommendations.  Assessment & Plan:   Principal Problem:   GI bleed Active Problems:   Severe sepsis (HCC)   AKI (acute kidney injury) ([place])   Acute metabolic encephalopathy   Hypokalemia   Encephalopathy   Nausea and vomiting   Coffee ground emesis   Elevated bilirubin   Malignant neoplasm of colon (HCC)   Upper GI bleed/grade D esophagitis, coffee-ground emesis Ongoing intractable nausea and vomiting. CT showed esophagitis.  repeat endoscopy in 04/05/21 by Dr. Benson Norway also showed grade D esophagitis.  Continue PPI, p.o. Protonix 40 mg twice daily. Continue sucralfate and baclofen as recommended by GI.   Continue IV antiemetics as needed Continue IV Phenergan added for intractable nausea and vomiting. Last seen by GI on 04/06/2021, secure chat sent to Dr. Benson Norway for further recommendations.  Intractable nausea and vomiting with concern  for gastroparesis Nuclear gastric emptying test ordered on 04/10/2021, pending. De-escalate diet to full liquids. Continue IV antiemetics as needed Start gentle IV fluid hydration LR 75 cc/h x 2 days.  Nonoliguric AKI on CKD stage IIIa due to poor oral intake, nausea and vomiting; Baseline creatinine appears to be 1.3 with GFR 59 Prior creatinine 1.80 Creatinine is uptrending 1.76 from 1.55 Continue IV fluid hydration, increase the rate to 75 cc/h x2 days. Continue to avoid nephrotoxic agents, hypotension, and dehydration. Repeat BMP in the morning  LA Grade D reflux esophagitis with no bleeding. Management per GI and as stated above.  Resolved post repletion: Hypokalemia Serum potassium 4.3, from previous 2.8.   SIRS; criteria has resolved. -Still no clear evidence of infection.  COVID-19/flu/UA/chest x-ray overall is negative.  Antibiotics have been discontinued.     Resolved acute metabolic encephalopathy -Unclear etiology.  CT head negative, neuro exam is nonfocal.  Ammonia level was unremarkable, B12 normal, TSH normal He is back to his baseline mentation.    New T wave inversions, elevated troponin - -Troponins remain flat.  Patient is chest pain-free.  Echocardiogram shows normal EF; G1DD.  We will hold off on any further inpatient work-up Denies any anginal symptoms at the time of this visit.   Colon cancer  -Outpatient follow-up with Dr [name].   Last chemo was September 2022.   Insulin-dependent type 2 diabetes with hyperglycemia, poorly controlled -Poorly controlled.  Hemoglobin A1c is 11.4 on 04/02/2021. Continue insulin sliding scale.   Essential hypertension- BP stable -Continue Norvasc, Coreg Continue home antihypertensives, initially they were held in the setting of sepsis  Generalized weakness/physical  debility PT assessed with recommendation for home health PT TOC consulted to assist with home health PT arrangement Continue fall  precautions.  Chronic constipation Senokot 2 tablets twice daily MiraLAX as needed   DVT prophylaxis: SCDs Start: 04/02/21 0437 Code Status: Full Family Communication:    Status is: Inpatient  Remains inpatient appropriate because: He is tolerating liquids this morning, will attempt to slowly advance it as necessary.  In the meantime will await GI clearance prior to discharge  Nutritional status  Nutrition Problem: Increased nutrient needs Etiology: cancer and cancer related treatments  Signs/Symptoms: estimated needs  Interventions: Glucerna shake, Premier Protein, MVI  Body mass index is 24.8 kg/m.    Examination: Constitutional: Frail-appearing no acute distress.  He is alert and oriented x3.   Respiratory: Clear to auscultation with no wheezes or rales.  Cardiovascular: Regular rate and rhythm no rubs or gallops.  Abdomen: Nontender bowel sounds present. Musculoskeletal: No lower extremity edema bilaterally.   Skin: No ulcerative lesions noted.   Neurologic: Nonfocal alert and awake. Psychiatric: Mood is appropriate for condition and setting.  Objective: Vitals:   04/11/21 2005 04/12/21 0515 04/12/21 0829 04/12/21 1205  BP: (!) 150/80 (!) 185/94 (!) 167/94 (!) 150/91  Pulse: 70 69 68 68  Resp: 16 16  14   Temp: 98.4 F (36.9 C) 98 F (36.7 C)  98.5 F (36.9 C)  TempSrc: Oral Oral  Oral  SpO2: 100% 100%  100%  Weight:      Height:        Intake/Output Summary (Last 24 hours) at 04/12/2021 1542 Last data filed at 04/12/2021 1300 Gross per 24 hour  Intake 1519.91 ml  Output 2200 ml  Net -680.09 ml    [facility] Weights   04/01/21 2117 04/05/21 1331  Weight: 85.4 kg 85.3 kg     Data Reviewed:   CBC: Recent Labs  Lab 04/08/21 0338 04/09/21 0533 04/10/21 0353 04/11/21 0300 04/12/21 0425  WBC 6.7 6.7 6.6 6.6 6.0  HGB 8.9* 8.1* 9.0* 8.7* 8.8*  HCT 27.0* 25.1* 27.1* 26.5* 26.9*  MCV 96.1 97.3 97.5 97.1 97.8  PLT 181 157 194 208 626   Basic  Metabolic Panel: Recent Labs  Lab 04/06/21 0542 04/07/21 0331 04/08/21 0338 04/09/21 0533 04/10/21 0353 04/11/21 0300 04/12/21 0425  NA 131*   < > 133* 135 137 135 136  K 2.8*   < > 3.5 4.3 3.7 3.6 3.7  CL 100   < > 103 107 105 102 103  CO2 24   < > 25 24 27 28 28   GLUCOSE 159*   < > 265* 211* 112* 138* 187*  BUN 32*   < > 24* 16 13 13 15   CREATININE 1.52*   < > 1.68* 1.39* 1.43* 1.55* 1.76*  CALCIUM 7.9*   < > 8.2* 7.8* 8.6* 8.3* 8.3*  MG 1.8   < > 2.0 2.1 2.0 1.8 1.8  PHOS 3.0  --   --   --   --   --   --    < > = values in this interval not displayed.   GFR: Estimated Creatinine Clearance: 52.3 mL/min (A) (by C-G formula based on SCr of 1.76 mg/dL (H)). Liver Function Tests: No results for input(s): AST, ALT, ALKPHOS, BILITOT, PROT, ALBUMIN in the last 168 hours.  No results for input(s): LIPASE, AMYLASE in the last 168 hours. No results for input(s): AMMONIA in the last 168 hours.  Coagulation Profile: No results for input(s): INR, PROTIME  in the last 168 hours.  Cardiac Enzymes: No results for input(s): CKTOTAL, CKMB, CKMBINDEX, TROPONINI in the last 168 hours. BNP (last 3 results) No results for input(s): PROBNP in the last 8760 hours. HbA1C: No results for input(s): HGBA1C in the last 72 hours. CBG: Recent Labs  Lab 04/11/21 1144 04/11/21 1653 04/11/21 2000 04/12/21 0729 04/12/21 1202  GLUCAP 110* 144* 99 189* 185*   Lipid Profile: No results for input(s): CHOL, HDL, LDLCALC, TRIG, CHOLHDL, LDLDIRECT in the last 72 hours. Thyroid Function Tests: No results for input(s): TSH, T4TOTAL, FREET4, T3FREE, THYROIDAB in the last 72 hours. Anemia Panel: No results for input(s): VITAMINB12, FOLATE, FERRITIN, TIBC, IRON, RETICCTPCT in the last 72 hours. Sepsis Labs: No results for input(s): PROCALCITON, LATICACIDVEN in the last 168 hours.   No results found for this or any previous visit (from the past 240 hour(s)).        Radiology Studies: No results  found.      Scheduled Meds:  amLODipine  5 mg Oral Daily   baclofen  10 mg Oral TID   carvedilol  3.125 mg Oral BID WC   Chlorhexidine Gluconate Cloth  6 each Topical Daily   feeding supplement (GLUCERNA SHAKE)  237 mL Oral TID BM   insulin aspart  0-15 Units Subcutaneous TID WC   insulin aspart  0-5 Units Subcutaneous QHS   insulin glargine-yfgn  10 Units Subcutaneous Daily   multivitamin with minerals  1 tablet Oral Daily   pantoprazole  40 mg Oral BID   Ensure Max Protein  11 oz Oral Daily   senna-docusate  2 tablet Oral BID   sodium chloride flush  10-40 mL Intracatheter Q12H   sodium chloride flush  10-40 mL Intracatheter Q12H   sucralfate  1 g Oral TID WC & HS   Continuous Infusions:  lactated ringers 75 mL/hr at 04/12/21 0836   promethazine (PHENERGAN) injection (IM or IVPB)       LOS: 10 days   Time spent= 35 mins    Kayleen Memos, MD [facility]  If 7PM-7AM, please contact night-coverage  04/12/2021, 3:42 PM

## 2021-04-12 NOTE — Progress Notes (Signed)
Pt ate [place] sandwich and ice cream with no vomiting or complaints of nausea.   Will continue to monitor.

## 2021-04-12 NOTE — TOC Progression Note (Signed)
Transition of Care [facility]) - Progression Note    Patient Details  Name: Mylen Mangan MRN: [id number] Date of Birth: Jan 01, 1966  Transition of Care [facility]) CM/SW Contact  Leeroy Cha, RN Phone Number: 04/12/2021, 8:21 AM  Clinical Narrative:    04/11/2021: Patient was seen and examined at bedside.  Reports nausea improved with IV Phenergan.  Started on clear liquid diet, will continue to monitor.     Assessment & Plan:   Principal Problem:   GI bleed Active Problems:   Severe sepsis (HCC)   AKI (acute kidney injury) ([place])   Acute metabolic encephalopathy   Hypokalemia   Encephalopathy   Nausea and vomiting   Coffee ground emesis   Elevated bilirubin   Malignant neoplasm of colon (HCC)  TOC PLAN OF CARE: Following for toc and dc needs pt still having bouts of nausea requiring iv phenergan. Following for progression.   Expected Discharge Plan: Home/Self Care Barriers to Discharge: Continued Medical Work up  Expected Discharge Plan and Services Expected Discharge Plan: Home/Self Care   Discharge Planning Services: CM Consult   Living arrangements for the past 2 months: Single Family Home                                       Social Determinants of Health (SDOH) Interventions    Readmission Risk Interventions No flowsheet data found.

## 2021-04-12 NOTE — Plan of Care (Signed)
  Problem: Clinical Measurements: Goal: Ability to maintain clinical measurements within normal limits will improve Outcome: Progressing   Problem: Nutrition: Goal: Adequate nutrition will be maintained Outcome: Progressing   Problem: Safety: Goal: Ability to remain free from injury will improve Outcome: Progressing   

## 2021-04-13 DIAGNOSIS — E876 Hypokalemia: Secondary | ICD-10-CM

## 2021-04-13 LAB — BASIC METABOLIC PANEL
Anion gap: 4 — ABNORMAL LOW (ref 5–15)
BUN: 14 mg/dL (ref 6–20)
CO2: 29 mmol/L (ref 22–32)
Calcium: 8.4 mg/dL — ABNORMAL LOW (ref 8.9–10.3)
Chloride: 101 mmol/L (ref 98–111)
Creatinine, Ser: 1.56 mg/dL — ABNORMAL HIGH (ref 0.61–1.24)
GFR, Estimated: 51 mL/min — ABNORMAL LOW (ref >60)
Glucose, Bld: 171 mg/dL — ABNORMAL HIGH (ref 70–99)
Potassium: 3.6 mmol/L (ref 3.5–5.1)
Sodium: 134 mmol/L — ABNORMAL LOW (ref 135–145)

## 2021-04-13 LAB — CBC
HCT: 25.4 % — ABNORMAL LOW (ref 39.0–52.0)
Hemoglobin: 8.3 g/dL — ABNORMAL LOW (ref 13.0–17.0)
MCH: 31.8 pg (ref 26.0–34.0)
MCHC: 32.7 g/dL (ref 30.0–36.0)
MCV: 97.3 fL (ref 80.0–100.0)
Platelets: 190 10*3/uL (ref 150–400)
RBC: 2.61 MIL/uL — ABNORMAL LOW (ref 4.22–5.81)
RDW: 13.2 % (ref 11.5–15.5)
WBC: 6.2 10*3/uL (ref 4.0–10.5)
nRBC: 0 % (ref 0.0–0.2)

## 2021-04-13 LAB — GLUCOSE, CAPILLARY
Glucose-Capillary: 157 mg/dL — ABNORMAL HIGH (ref 70–99)
Glucose-Capillary: 114 mg/dL — ABNORMAL HIGH (ref 70–99)
Glucose-Capillary: 124 mg/dL — ABNORMAL HIGH (ref 70–99)
Glucose-Capillary: 111 mg/dL — ABNORMAL HIGH (ref 70–99)

## 2021-04-13 LAB — PHOSPHORUS: Phosphorus: 3.5 mg/dL (ref 2.5–4.6)

## 2021-04-13 LAB — MAGNESIUM: Magnesium: 1.8 mg/dL (ref 1.7–2.4)

## 2021-04-13 MED ORDER — POLYETHYLENE GLYCOL 3350 17 G PO PACK
17.0000 g | PACK | Freq: Every day | ORAL | Status: DC
  Administered 2021-04-15: 17 g via ORAL
  Filled 2021-04-13 (×2): qty 1

## 2021-04-13 MED ORDER — SUCRALFATE 1 GM/10ML PO SUSP
1.0000 g | Freq: Two times a day (BID) | ORAL | Status: DC
  Administered 2021-04-14 – 2021-04-15 (×3): 1 g via ORAL
  Filled 2021-04-13 (×3): qty 10

## 2021-04-13 MED ORDER — DOCUSATE SODIUM 100 MG PO CAPS
100.0000 mg | ORAL_CAPSULE | Freq: Two times a day (BID) | ORAL | Status: DC
  Administered 2021-04-14 – 2021-04-15 (×3): 100 mg via ORAL
  Filled 2021-04-13 (×4): qty 1

## 2021-04-13 NOTE — Plan of Care (Signed)
  Problem: Health Behavior/Discharge Planning: Goal: Ability to manage health-related needs will improve Outcome: Progressing   Problem: Clinical Measurements: Goal: Diagnostic test results will improve Outcome: Progressing   Problem: Activity: Goal: Risk for activity intolerance will decrease Outcome: Progressing   Problem: Safety: Goal: Ability to remain free from injury will improve Outcome: Progressing

## 2021-04-13 NOTE — Progress Notes (Addendum)
Subjective: Patient seems to have problems swallowing solids and has been having vomiting shortly after eating.  He has therefore been switched back to full liquid diet which she seems to be tolerating well. He is also suffering from constipation and has not had a bowel movement in several days. He denies having abdominal pain.  He has been advised to take Senokot twice a day but he has been refusing his medications, as per his nurse. He is keen on going home before Thanksgiving.   Objective: Vital signs in last 24 hours: Temp:  [97.4 F (36.3 C)-98.8 F (37.1 C)] 98.1 F (36.7 C) (11/23 1318) Pulse Rate:  [64-68] 66 (11/23 1318) Resp:  [16] 16 (11/23 1318) BP: (131-172)/(86-94) 172/94 (11/23 1318) SpO2:  [96 %-100 %] 98 % (11/23 1318) Last BM Date: 04/02/21  Intake/Output from previous day: 11/22 0701 - 11/23 0700 In: 2430 [P.O.:900; I.V.:1530] Out: 2450 [Urine:2450] Intake/Output this shift: Total I/O In: -  Out: 1325 [Urine:825; Emesis/NG output:500]  General appearance: alert, cooperative, appears older than stated age, fatigued, no distress, and moderately obese Resp: clear to auscultation bilaterally Cardio: regular rate and rhythm, S1, S2 normal, no murmur, click, rub or gallop GI: soft, non-tender; bowel sounds normal; no masses,  no organomegaly  Lab Results: Recent Labs    04/11/21 0300 04/12/21 0425 04/13/21 0345  WBC 6.6 6.0 6.2  HGB 8.7* 8.8* 8.3*  HCT 26.5* 26.9* 25.4*  PLT 208 209 190   BMET Recent Labs    04/11/21 0300 04/12/21 0425 04/13/21 0345  NA 135 136 134*  K 3.6 3.7 3.6  CL 102 103 101  CO2 28 28 29   GLUCOSE 138* 187* 171*  BUN 13 15 14   CREATININE 1.55* 1.76* 1.56*  CALCIUM 8.3* 8.3* 8.4*   Medications: I have reviewed the patient's current medications.  Assessment/Plan: 1) LA grade D esophagitis-on sucralfate and double dose PPI PO.  Patient is having a hard time tolerating sucralfate therefore will decrease to 16 frequency of the  sucralfate to twice daily dose. I feel he should be maintained on a full full liquid diet for a few days till his esophagitis improves before trying him on regular food.  As per my discussion with Dr. Reesa Chew, feel the patient is not ready for discharge at the present time 2) Chronic constipation-we will discontinue Senokot and try Colace and MiraLAX instead.  Liberal fluid intake has been advised. 3) Personal history of colon cancer-patient finished his last chemo in September this year. 4) AKI on chronic kidney disease stage IIIa 5) IDDM 6) HTN. 7) Acute metabolic encephalopathy-improved. 8) anemia of chronic disease.   Dr. Thornton Park with the [facility] is covering for the Thanksgiving weekend. Please contact her with any questions or concerns.  LOS: 11 days   Juanita Craver 04/13/2021, 3:53 PM

## 2021-04-13 NOTE — Progress Notes (Signed)
Pt had 2 episodes of emesis today. MD Amin notified.

## 2021-04-13 NOTE — Progress Notes (Signed)
PROGRESS NOTE    Jeffrey Young  XHB:[id number] DOB: 19-Nov-1965 DOA: 04/01/2021 PCP: [name], MD   Brief Narrative:  55 year old with history of colon cancer, HTN, HLD, insulin-dependent DM2, diastolic CHF, CKD stage IIIb  brought to the hospital for confusion and coffee-ground emesis.  Initial work-up was unremarkable for any infection.  CT of the abdomen pelvis showed esophageal thickening concerning for infection versus inflammatory.  Head CT was negative without any meningeal signs.  He was placed on broad-spectrum antibiotics, GI was consulted and EGD performed on 04/05/21, it showed grade D esophagitis without evidence of active bleeding in the normal stomach.  He was started on Carafate and baclofen 3 times daily. Hospital course complicated by intractable nausea and vomiting.  Nuclear gastric emptying test ordered and pending.  GI reconsulted.    Assessment & Plan:   Principal Problem:   GI bleed Active Problems:   Severe sepsis (HCC)   AKI (acute kidney injury) ([place])   Acute metabolic encephalopathy   Hypokalemia   Encephalopathy   Nausea and vomiting   Coffee ground emesis   Elevated bilirubin   Malignant neoplasm of colon (HCC)  Upper GI bleed/esophagitis, coffee-ground emesis Esophagitis LA grade D Nausea vomiting, intermittent -Hemoglobin remained stable.  CT showed esophagitis.  repeat endoscopy in 11/15 which also showed grade D esophagitis.  Patient is on PPI, antiemetics, baclofen, Carafate.  Patient has not been seen by GI for several days now -Gastric emptying study ordered-pending.  Once completed we can attempt Reglan.  Spoke with Dr. Benson Norway this morning  Hypokalemia - Replete as needed   SIRS; Improved.  -Still no clear evidence of infection.  COVID-19/flu/UA/chest x-ray overall is negative.  Antibiotics have been discontinued.  Clinically monitor him.   AKI on CKD stage IIIa; Resolved -Baseline around 1.5.  Admission creatinine 1.9.     Acute  metabolic encephalopathy, resolved -Unclear etiology.  CT head negative, neuro exam is nonfocal.  Ammonia level was unremarkable, B12 normal, TSH normal    New T wave inversions, elevated troponin - -Troponins remain flat.  Patient is chest pain-free.  Echocardiogram shows normal EF; G1DD.  We will hold off on any further inpatient work-up   Colon cancer  -Outpatient follow-up with Dr [name].  Last chemo was September 2022.   Insulin-dependent type 2 diabetes with hyperglycemia, poorly controlled -Poorly controlled.  Hemoglobin A1c is 11.4.   Essential hypertension- -Continue Norvasc, Coreg resume home antihypertensives, initially they were held in the setting of sepsis    DVT prophylaxis: SCDs Start: 04/02/21 0437 Code Status: Full code Family Communication:    Status is: Inpatient  Remains inpatient appropriate because: Still having nausea vomiting, not tolerating orals     Nutritional status  Nutrition Problem: Increased nutrient needs Etiology: cancer and cancer related treatments  Signs/Symptoms: estimated needs  Interventions: Glucerna shake, Premier Protein, MVI  Body mass index is 24.8 kg/m.           Subjective: Patient still having nausea vomiting no, not tolerating orals.  He just had an episode of vomiting right before I came to his room.  Review of Systems Otherwise negative except as per HPI, including: General: Denies fever, chills, night sweats or unintended weight loss. Resp: Denies cough, wheezing, shortness of breath. Cardiac: Denies chest pain, palpitations, orthopnea, paroxysmal nocturnal dyspnea. GI: Denies abdominal pain, nausea, vomiting, diarrhea or constipation GU: Denies dysuria, frequency, hesitancy or incontinence MS: Denies muscle aches, joint pain or swelling Neuro: Denies headache, neurologic deficits (focal weakness, numbness,  tingling), abnormal gait Psych: Denies anxiety, depression, SI/HI/AVH Skin: Denies new rashes or  lesions ID: Denies sick contacts, exotic exposures, travel  Examination:  General exam: Appears calm and comfortable  Respiratory system: Clear to auscultation. Respiratory effort normal. Cardiovascular system: S1 & S2 heard, RRR. No JVD, murmurs, rubs, gallops or clicks. No pedal edema. Gastrointestinal system: Abdomen is nondistended, soft and nontender. No organomegaly or masses felt. Normal bowel sounds heard. Central nervous system: Alert and oriented. No focal neurological deficits. Extremities: Symmetric 5 x 5 power. Skin: No rashes, lesions or ulcers Psychiatry: Judgement and insight appear normal. Mood & affect appropriate.     Objective: Vitals:   04/12/21 1205 04/12/21 2051 04/13/21 0531 04/13/21 0751  BP: (!) 150/91 (!) 154/89 131/86 (!) 149/94  Pulse: 68 68 64 67  Resp: 14 16 16    Temp: 98.5 F (36.9 C) 98.8 F (37.1 C) (!) 97.4 F (36.3 C)   TempSrc: Oral Oral Oral   SpO2: 100% 100% 96%   Weight:      Height:        Intake/Output Summary (Last 24 hours) at 04/13/2021 0921 Last data filed at 04/13/2021 0500 Gross per 24 hour  Intake 2190 ml  Output 2450 ml  Net -260 ml   Filed Weights   04/01/21 2117 04/05/21 1331  Weight: 85.4 kg 85.3 kg     Data Reviewed:   CBC: Recent Labs  Lab 04/09/21 0533 04/10/21 0353 04/11/21 0300 04/12/21 0425 04/13/21 0345  WBC 6.7 6.6 6.6 6.0 6.2  HGB 8.1* 9.0* 8.7* 8.8* 8.3*  HCT 25.1* 27.1* 26.5* 26.9* 25.4*  MCV 97.3 97.5 97.1 97.8 97.3  PLT 157 194 208 209 093   Basic Metabolic Panel: Recent Labs  Lab 04/09/21 0533 04/10/21 0353 04/11/21 0300 04/12/21 0425 04/13/21 0345  NA 135 137 135 136 134*  K 4.3 3.7 3.6 3.7 3.6  CL 107 105 102 103 101  CO2 24 27 28 28 29   GLUCOSE 211* 112* 138* 187* 171*  BUN 16 13 13 15 14   CREATININE 1.39* 1.43* 1.55* 1.76* 1.56*  CALCIUM 7.8* 8.6* 8.3* 8.3* 8.4*  MG 2.1 2.0 1.8 1.8 1.8  PHOS  --   --   --   --  3.5   GFR: Estimated Creatinine Clearance: 59 mL/min (A)  (by C-G formula based on SCr of 1.56 mg/dL (H)). Liver Function Tests: No results for input(s): AST, ALT, ALKPHOS, BILITOT, PROT, ALBUMIN in the last 168 hours. No results for input(s): LIPASE, AMYLASE in the last 168 hours. No results for input(s): AMMONIA in the last 168 hours. Coagulation Profile: No results for input(s): INR, PROTIME in the last 168 hours. Cardiac Enzymes: No results for input(s): CKTOTAL, CKMB, CKMBINDEX, TROPONINI in the last 168 hours. BNP (last 3 results) No results for input(s): PROBNP in the last 8760 hours. HbA1C: No results for input(s): HGBA1C in the last 72 hours. CBG: Recent Labs  Lab 04/12/21 0729 04/12/21 1202 04/12/21 1725 04/12/21 2047 04/13/21 0743  GLUCAP 189* 185* 139* 141* 157*   Lipid Profile: No results for input(s): CHOL, HDL, LDLCALC, TRIG, CHOLHDL, LDLDIRECT in the last 72 hours. Thyroid Function Tests: No results for input(s): TSH, T4TOTAL, FREET4, T3FREE, THYROIDAB in the last 72 hours. Anemia Panel: No results for input(s): VITAMINB12, FOLATE, FERRITIN, TIBC, IRON, RETICCTPCT in the last 72 hours. Sepsis Labs: No results for input(s): PROCALCITON, LATICACIDVEN in the last 168 hours.  No results found for this or any previous visit (from the past  240 hour(s)).       Radiology Studies: No results found.      Scheduled Meds:  amLODipine  5 mg Oral Daily   baclofen  10 mg Oral TID   carvedilol  3.125 mg Oral BID WC   Chlorhexidine Gluconate Cloth  6 each Topical Daily   feeding supplement (GLUCERNA SHAKE)  237 mL Oral TID BM   insulin aspart  0-15 Units Subcutaneous TID WC   insulin aspart  0-5 Units Subcutaneous QHS   insulin glargine-yfgn  10 Units Subcutaneous Daily   multivitamin with minerals  1 tablet Oral Daily   pantoprazole  40 mg Oral BID   Ensure Max Protein  11 oz Oral Daily   senna-docusate  2 tablet Oral BID   sodium chloride flush  10-40 mL Intracatheter Q12H   sodium chloride flush  10-40 mL  Intracatheter Q12H   sucralfate  1 g Oral TID WC & HS   Continuous Infusions:  lactated ringers 75 mL/hr at 04/13/21 0612   promethazine (PHENERGAN) injection (IM or IVPB)       LOS: 11 days   Time spent= 35 mins    Kelilah Hebard Arsenio Loader, MD [facility]  If 7PM-7AM, please contact night-coverage  04/13/2021, 9:21 AM

## 2021-04-13 NOTE — Progress Notes (Signed)
   04/13/21 1200  Mobility  Activity Refused mobility   Pt refused mobility secondary to experiencing vertigo. He stated that he vomited this morning immediately after moving to beside. Grabbed pt some emesis bags and left them on side table. Will check back with pt periodically while he is here. Hold mobility for today.   [place] Specialist Acute Rehab Services Office: [phone]

## 2021-04-14 ENCOUNTER — Encounter: Payer: Self-pay | Admitting: Oncology

## 2021-04-14 ENCOUNTER — Inpatient Hospital Stay (HOSPITAL_COMMUNITY): Payer: 59

## 2021-04-14 LAB — GLUCOSE, CAPILLARY
Glucose-Capillary: 113 mg/dL — ABNORMAL HIGH (ref 70–99)
Glucose-Capillary: 126 mg/dL — ABNORMAL HIGH (ref 70–99)
Glucose-Capillary: 88 mg/dL (ref 70–99)
Glucose-Capillary: 135 mg/dL — ABNORMAL HIGH (ref 70–99)

## 2021-04-14 MED ORDER — LACTULOSE 10 GM/15ML PO SOLN
20.0000 g | Freq: Two times a day (BID) | ORAL | Status: AC
  Administered 2021-04-14 – 2021-04-15 (×2): 20 g via ORAL
  Filled 2021-04-14 (×2): qty 30

## 2021-04-14 NOTE — TOC Progression Note (Addendum)
Transition of Care [facility]) - Progression Note    Patient Details  Name: Jeffrey Young MRN: [id number] Date of Birth: [birth year]  [facility]) CM/SW Contact  [place]ada Salamone, Juliann Pulse, RN Phone Number: 04/14/2021, 9:48 AM  Clinical Narrative: Noted for HHPT-checking on [facility] agency to accept.[facility] currently checking.   10:34a-[facility] rep [facility] Marrow accepted-HHRN/PT-spoke to [facility]) in agreement. No further CM needs. 12:08p-Per [place] rep Kenzie-unable to verify Health insurance through their system. I have spoken to patient who says his insurance is active, & they have already signed up for next year same insurance [employer]-left vm w/spouse for call back-asked patient to have wife to bring in [place] card tomorrow & take to admitting to verify.[place] rep Ramond Marrow is currently unable to accept if no health insurance, & they are not charity today.Will f/u tomorrow. 12:55p-spoke to spouse-she will give insurance card to [facility] to make 2 copies of front/back 1 send to admitting;other put in shadow chart-[facility] rep Kenzie aware-will f/u in am.  Expected Discharge Plan: Home/Self Care Barriers to Discharge: Continued Medical Work up  Expected Discharge Plan and Services Expected Discharge Plan: Home/Self Care   Discharge Planning Services: CM Consult   Living arrangements for the past 2 months: Single Family Home                                       Social Determinants of Health (SDOH) Interventions    Readmission Risk Interventions No flowsheet data found.

## 2021-04-14 NOTE — Plan of Care (Signed)
  Problem: Health Behavior/Discharge Planning: Goal: Ability to manage health-related needs will improve Outcome: Progressing   Problem: Clinical Measurements: Goal: Respiratory complications will improve Outcome: Progressing Goal: Cardiovascular complication will be avoided Outcome: Progressing   Problem: Elimination: Goal: Will not experience complications related to urinary retention Outcome: Progressing

## 2021-04-14 NOTE — Progress Notes (Signed)
PROGRESS NOTE    Jeffrey Young  SWN:[id number] DOB: [birth year] DOA: 04/01/2021 PCP: [name], MD   Brief Narrative:  55 year old with history of colon cancer, HTN, HLD, insulin-dependent DM2, diastolic CHF, CKD stage IIIb  brought to the hospital for confusion and coffee-ground emesis.  Initial work-up was unremarkable for any infection.  CT of the abdomen pelvis showed esophageal thickening concerning for infection versus inflammatory.  Head CT was negative without any meningeal signs.  He was placed on broad-spectrum antibiotics, GI was consulted and EGD performed on 04/05/21, it showed grade D esophagitis without evidence of active bleeding in the normal stomach.  He was started on Carafate and baclofen 3 times daily. Hospital course complicated by intractable nausea and vomiting.  Nuclear gastric emptying test ordered and pending.  GI reconsulted.    Assessment & Plan:   Principal Problem:   GI bleed Active Problems:   Severe sepsis (HCC)   AKI (acute kidney injury) ([place])   Acute metabolic encephalopathy   Hypokalemia   Encephalopathy   Nausea and vomiting   Coffee ground emesis   Elevated bilirubin   Malignant neoplasm of colon (HCC)  Upper GI bleed/esophagitis, coffee-ground emesis Esophagitis LA grade D Nausea vomiting, intermittent -Hemoglobin remained stable.  CT showed esophagitis.  repeat endoscopy in 11/15 which also showed grade D esophagitis.  Patient is on PPI, antiemetics, baclofen, Carafate has been changed to twice daily as patient thinks it is making him nauseous.  We will need to continue with full liquid diet at this time. - Gastric emptying study is still pending.  Seen by gastroenterology.  Hypokalemia - Replete as needed   SIRS; Improved.  -Still no clear evidence of infection.  COVID-19/flu/UA/chest x-ray overall is negative.  Antibiotics have been discontinued.  Clinically monitor him.   AKI on CKD stage IIIa; Resolved -Baseline around 1.5.   Admission creatinine 1.9.     Acute metabolic encephalopathy, resolved -Unclear etiology.  CT head negative, neuro exam is nonfocal.  Ammonia level was unremarkable, B12 normal, TSH normal    New T wave inversions, elevated troponin - -Troponins remain flat.  Patient is chest pain-free.  Echocardiogram shows normal EF; G1DD.  We will hold off on any further inpatient work-up   Colon cancer  -Outpatient follow-up with Dr [name].  Last chemo was September 2022.   Insulin-dependent type 2 diabetes with hyperglycemia, poorly controlled -Poorly controlled.  Hemoglobin A1c is 11.4.   Essential hypertension- -Continue Norvasc, Coreg resume home antihypertensives, initially they were held in the setting of sepsis    DVT prophylaxis: SCDs Start: 04/02/21 0437 Code Status: Full code Family Communication: None at bedside  Status is: Inpatient  Remains inpatient appropriate because: Still having nausea vomiting, not tolerating orals.  Nutritional status  Nutrition Problem: Increased nutrient needs Etiology: cancer and cancer related treatments  Signs/Symptoms: estimated needs  Interventions: Glucerna shake, Premier Protein, MVI  Body mass index is 24.8 kg/m.           Subjective: Initially as we were discussing patient's nausea and vomiting was better he started having significant amount of vomiting while I was seeing him.  He still not tolerating orals.  Because of persistent vomiting he feels uncomfortable going home today.  Review of Systems Otherwise negative except as per HPI, including: General: Denies fever, chills, night sweats or unintended weight loss. Resp: Denies cough, wheezing, shortness of breath. Cardiac: Denies chest pain, palpitations, orthopnea, paroxysmal nocturnal dyspnea. GI: Denies diarrhea or constipation GU: Denies dysuria, frequency, hesitancy  or incontinence MS: Denies muscle aches, joint pain or swelling Neuro: Denies headache, neurologic  deficits (focal weakness, numbness, tingling), abnormal gait Psych: Denies anxiety, depression, SI/HI/AVH Skin: Denies new rashes or lesions ID: Denies sick contacts, exotic exposures, travel Examination:  Constitutional: Not in acute distress Respiratory: Clear to auscultation bilaterally Cardiovascular: Normal sinus rhythm, no rubs Abdomen: Nontender nondistended good bowel sounds Musculoskeletal: No edema noted Skin: No rashes seen Neurologic: CN 2-12 grossly intact.  And nonfocal Psychiatric: Normal judgment and insight. Alert and oriented x 3. Normal mood. Objective: Vitals:   04/13/21 1947 04/13/21 2330 04/14/21 0541 04/14/21 0925  BP: (!) 181/93 (!) 158/91 (!) 164/102 (!) 156/95  Pulse: 70  68 74  Resp: 16  18   Temp: 98.2 F (36.8 C)  98.5 F (36.9 C)   TempSrc: Oral  Oral   SpO2: 98%  97%   Weight:      Height:        Intake/Output Summary (Last 24 hours) at 04/14/2021 1111 Last data filed at 04/14/2021 0700 Gross per 24 hour  Intake 1855.06 ml  Output 1650 ml  Net 205.06 ml   [facility] Weights   04/01/21 2117 04/05/21 1331  Weight: 85.4 kg 85.3 kg     Data Reviewed:   CBC: Recent Labs  Lab 04/09/21 0533 04/10/21 0353 04/11/21 0300 04/12/21 0425 04/13/21 0345  WBC 6.7 6.6 6.6 6.0 6.2  HGB 8.1* 9.0* 8.7* 8.8* 8.3*  HCT 25.1* 27.1* 26.5* 26.9* 25.4*  MCV 97.3 97.5 97.1 97.8 97.3  PLT 157 194 208 209 194   Basic Metabolic Panel: Recent Labs  Lab 04/09/21 0533 04/10/21 0353 04/11/21 0300 04/12/21 0425 04/13/21 0345  NA 135 137 135 136 134*  K 4.3 3.7 3.6 3.7 3.6  CL 107 105 102 103 101  CO2 24 27 28 28 29   GLUCOSE 211* 112* 138* 187* 171*  BUN 16 13 13 15 14   CREATININE 1.39* 1.43* 1.55* 1.76* 1.56*  CALCIUM 7.8* 8.6* 8.3* 8.3* 8.4*  MG 2.1 2.0 1.8 1.8 1.8  PHOS  --   --   --   --  3.5   GFR: Estimated Creatinine Clearance: 59 mL/min (A) (by C-G formula based on SCr of 1.56 mg/dL (H)). Liver Function Tests: No results for input(s): AST,  ALT, ALKPHOS, BILITOT, PROT, ALBUMIN in the last 168 hours. No results for input(s): LIPASE, AMYLASE in the last 168 hours. No results for input(s): AMMONIA in the last 168 hours. Coagulation Profile: No results for input(s): INR, PROTIME in the last 168 hours. Cardiac Enzymes: No results for input(s): CKTOTAL, CKMB, CKMBINDEX, TROPONINI in the last 168 hours. BNP (last 3 results) No results for input(s): PROBNP in the last 8760 hours. HbA1C: No results for input(s): HGBA1C in the last 72 hours. CBG: Recent Labs  Lab 04/13/21 0743 04/13/21 1153 04/13/21 1644 04/13/21 2134 04/14/21 0724  GLUCAP 157* 114* 124* 111* 113*   Lipid Profile: No results for input(s): CHOL, HDL, LDLCALC, TRIG, CHOLHDL, LDLDIRECT in the last 72 hours. Thyroid Function Tests: No results for input(s): TSH, T4TOTAL, FREET4, T3FREE, THYROIDAB in the last 72 hours. Anemia Panel: No results for input(s): VITAMINB12, FOLATE, FERRITIN, TIBC, IRON, RETICCTPCT in the last 72 hours. Sepsis Labs: No results for input(s): PROCALCITON, LATICACIDVEN in the last 168 hours.  No results found for this or any previous visit (from the past 240 hour(s)).       Radiology Studies: No results found.      Scheduled Meds:  amLODipine  5 mg Oral Daily   baclofen  10 mg Oral TID   carvedilol  3.125 mg Oral BID WC   Chlorhexidine Gluconate Cloth  6 each Topical Daily   docusate sodium  100 mg Oral BID   feeding supplement (GLUCERNA SHAKE)  237 mL Oral TID BM   insulin aspart  0-15 Units Subcutaneous TID WC   insulin aspart  0-5 Units Subcutaneous QHS   insulin glargine-yfgn  10 Units Subcutaneous Daily   multivitamin with minerals  1 tablet Oral Daily   pantoprazole  40 mg Oral BID   polyethylene glycol  17 g Oral Daily   Ensure Max Protein  11 oz Oral Daily   senna-docusate  2 tablet Oral BID   sodium chloride flush  10-40 mL Intracatheter Q12H   sodium chloride flush  10-40 mL Intracatheter Q12H   sucralfate   1 g Oral BID   Continuous Infusions:  promethazine (PHENERGAN) injection (IM or IVPB)       LOS: 12 days   Time spent= 35 mins    Jeffrey Young Arsenio Loader, MD [facility]  If 7PM-7AM, please contact night-coverage  04/14/2021, 11:11 AM

## 2021-04-15 ENCOUNTER — Encounter: Payer: Self-pay | Admitting: Oncology

## 2021-04-15 LAB — GLUCOSE, CAPILLARY
Glucose-Capillary: 124 mg/dL — ABNORMAL HIGH (ref 70–99)
Glucose-Capillary: 83 mg/dL (ref 70–99)

## 2021-04-15 MED ORDER — CHLORHEXIDINE GLUCONATE 0.12 % MT SOLN
15.0000 mL | Freq: Two times a day (BID) | OROMUCOSAL | Status: DC
  Administered 2021-04-15: 15 mL via OROMUCOSAL
  Filled 2021-04-15: qty 15

## 2021-04-15 MED ORDER — POLYETHYLENE GLYCOL 3350 17 G PO PACK: 17.0000 g | PACK | Freq: Every day | ORAL | 0 refills | Status: DC | PRN

## 2021-04-15 MED ORDER — SUCRALFATE 1 GM/10ML PO SUSP
1.0000 g | Freq: Two times a day (BID) | ORAL | 0 refills | Status: DC
Start: 2021-04-15 — End: 2021-08-19

## 2021-04-15 MED ORDER — HEPARIN SOD (PORK) LOCK FLUSH 100 UNIT/ML IV SOLN
500.0000 [IU] | INTRAVENOUS | Status: AC | PRN
  Administered 2021-04-15: 500 [IU]

## 2021-04-15 MED ORDER — ORAL CARE MOUTH RINSE: 15.0000 mL | Freq: Two times a day (BID) | OROMUCOSAL | Status: DC

## 2021-04-15 MED ORDER — SENNOSIDES-DOCUSATE SODIUM 8.6-50 MG PO TABS: 2.0000 | ORAL_TABLET | Freq: Every evening | ORAL | 0 refills | Status: DC | PRN

## 2021-04-15 MED ORDER — BACLOFEN 10 MG PO TABS: 10.0000 mg | ORAL_TABLET | Freq: Three times a day (TID) | ORAL | 0 refills | Status: AC

## 2021-04-15 NOTE — TOC Transition Note (Addendum)
Transition of Care [facility]) - CM/SW Discharge Note   Patient Details  Name: Jeffrey Young MRN: [id number] Date of Birth: [birth year]  Transition of Care [facility]) CM/SW Contact:  Dessa Phi, RN Phone Number: 04/15/2021, 2:26 PM   Clinical Narrative: Qualifies for charity through [facility] [place] aware-HHPT/RN-med mgmnt.-SOC 1st of week.No further CM needs.     Final next level of care: [place] Barriers to Discharge: No Barriers Identified   Patient Goals and CMS Choice Patient states their goals for this hospitalization and ongoing recovery are:: to go home CMS Medicare.gov Compare Post Acute Care list provided to:: Patient    Discharge Placement                       Discharge Plan and Services   Discharge Planning Services: CM Consult                      HH Arranged: PT, RN [facility] Agency: [place] Date [place]: 04/15/21 Time [place]: 8335 Representative spoke with at [place]: [place] ([place]) Interventions     Readmission Risk Interventions No flowsheet data found.

## 2021-04-15 NOTE — Plan of Care (Signed)
  Problem: Activity: Goal: Risk for activity intolerance will decrease Outcome: Progressing   Problem: Coping: Goal: Level of anxiety will decrease Outcome: Progressing   Problem: Pain Managment: Goal: General experience of comfort will improve Outcome: Progressing   Problem: Safety: Goal: Ability to remain free from injury will improve Outcome: Progressing   Problem: Skin Integrity: Goal: Risk for impaired skin integrity will decrease Outcome: Progressing   

## 2021-04-15 NOTE — Progress Notes (Signed)
Physical Therapy Treatment Patient Details Name: Jeffrey Young MRN: [id number] DOB: 1966-04-17 Today's Date: 04/15/2021   History of Present Illness Pt is a 55 yo male with history of colon cancer, HTN, HLD, insulin-dependent DM2, diastolic CHF, CKD , transmetatarsal  amputation left foot and was brought to the hospital for confusion and coffee-ground emesis on 04/01/2021. EGD- esophogitis.    PT Comments    Pt reports not being very mobile the past few days due to not feeling well so agreeable to ambulate in hallway.  Pt able to tolerate improved distance today and states he awaits d/c home today as well.    Recommendations for follow up therapy are one component of a multi-disciplinary discharge planning process, led by the attending physician.  Recommendations may be updated based on patient status, additional functional criteria and insurance authorization.  Follow Up Recommendations  Home health PT (could be OPPT if pt has transportation)     Assistance Recommended at Discharge None  Equipment Recommendations  None recommended by PT    Recommendations for Other Services       Precautions / Restrictions Precautions Precautions: None Required Braces or Orthoses: Other Brace Other Brace: L shoe AFO     Mobility  Bed Mobility Overal bed mobility: Modified Independent                  Transfers Overall transfer level: Needs assistance Equipment used: Rolling walker (2 wheels) Transfers: Sit to/from Stand Sit to Stand: Supervision           General transfer comment: good safety cognition and use of hands to steady self    Ambulation/Gait Ambulation/Gait assistance: Min guard;Supervision Gait Distance (Feet): 350 Feet Assistive device: Rolling walker (2 wheels) Gait Pattern/deviations: Step-through pattern Gait velocity: decreased     General Gait Details: slow but steady with RW, denies nausea and no emesis today; pt only c/o generalized  weakness   Stairs             Wheelchair Mobility    Modified Rankin (Stroke Patients Only)       Balance                                            Cognition Arousal/Alertness: Awake/alert Behavior During Therapy: WFL for tasks assessed/performed Overall Cognitive Status: Within Functional Limits for tasks assessed                                          Exercises      General Comments        Pertinent Vitals/Pain Pain Assessment: No/denies pain    Home Living                          Prior Function            PT Goals (current goals can now be found in the care plan section) Progress towards PT goals: Progressing toward goals    Frequency    Min 3X/week      PT Plan Current plan remains appropriate    Co-evaluation              AM-PAC PT "6 Clicks" Mobility   Outcome Measure  Help needed turning from your back  to your side while in a flat bed without using bedrails?: None Help needed moving from lying on your back to sitting on the side of a flat bed without using bedrails?: None Help needed moving to and from a bed to a chair (including a wheelchair)?: A Little Help needed standing up from a chair using your arms (e.g., wheelchair or bedside chair)?: A Little Help needed to walk in hospital room?: A Little Help needed climbing 3-5 steps with a railing? : A Little 6 Click Score: 20    End of Session   Activity Tolerance: Patient tolerated treatment well Patient left: in chair;with call bell/phone within reach Nurse Communication: Mobility status PT Visit Diagnosis: Difficulty in walking, not elsewhere classified (R26.2)     Time: 4585-9292 PT Time Calculation (min) (ACUTE ONLY): 18 min  Charges:  $Gait Training: 8-22 mins                    Arlyce Dice, DPT Acute Rehabilitation Services Pager: [phone] Office: [place] 04/15/2021, 1:39 PM

## 2021-04-15 NOTE — Discharge Summary (Signed)
Physician Discharge Summary  Jeffrey Young KPT:[id number] DOB: 02/25/1966 DOA: 04/01/2021  PCP: [name], MD  Admit date: 04/01/2021 Discharge date: 04/15/2021  Admitted From: Home Disposition:  Home  Recommendations for Outpatient Follow-up:  Follow up with PCP in 1-2 weeks Please obtain BMP/CBC in one week your next doctors visit.  Continue full liquid diet. Outpatient follow-up with gastroenterology to be arranged by their service.  Follow-up in next 2-4 weeks Oral baclofen and Carafate prescribed. Advised to take stool softener as necessary to have at least 1-2 soft bowel movements daily   Discharge Condition: Stable CODE STATUS: Full code Diet recommendation: Diabetic  Brief/Interim Summary: 55 year old with history of colon cancer, HTN, HLD, insulin-dependent DM2, diastolic CHF, CKD stage IIIb  brought to the hospital for confusion and coffee-ground emesis.  Initial work-up was unremarkable for any infection.  CT of the abdomen pelvis showed esophageal thickening concerning for infection versus inflammatory.  Head CT was negative without any meningeal signs.  He was placed on broad-spectrum antibiotics, GI was consulted and EGD performed on 04/05/21, it showed grade D esophagitis without evidence of active bleeding in the normal stomach.  He was started on Carafate and baclofen 3 times daily. Hospital course complicated by intractable nausea and vomiting.  Patient cleared by GI for discharge. Attempted to call his wife on the day of discharge but no answer.   Assessment & Plan:   Principal Problem:   GI bleed Active Problems:   Severe sepsis (HCC)   AKI (acute kidney injury) ([place])   Acute metabolic encephalopathy   Hypokalemia   Encephalopathy   Nausea and vomiting   Coffee ground emesis   Elevated bilirubin   Malignant neoplasm of colon (HCC)   Upper GI bleed/esophagitis, coffee-ground emesis Esophagitis LA grade D Nausea vomiting, intermittent -Hemoglobin  remained stable.  CT showed esophagitis.  repeat endoscopy in 11/15 which also showed grade D esophagitis.  Patient is on PPI, antiemetics, baclofen, Carafate has been changed to twice daily as patient thinks it is making him nauseous.  Advised to continue full liquid diet at home. - Seen by GI, cleared for discharge.   Hypokalemia - Replete as needed   SIRS; Improved.  -Still no clear evidence of infection.  COVID-19/flu/UA/chest x-ray overall is negative.  Antibiotics have been discontinued.  Clinically monitor him.   AKI on CKD stage IIIa; Resolved -Baseline around 1.5.  Admission creatinine 1.9.     Acute metabolic encephalopathy, resolved -Unclear etiology.  CT head negative, neuro exam is nonfocal.  Ammonia level was unremarkable, B12 normal, TSH normal    New T wave inversions, elevated troponin - -Troponins remain flat.  Patient is chest pain-free.  Echocardiogram shows normal EF; G1DD.  We will hold off on any further inpatient work-up   Colon cancer  -Outpatient follow-up with Dr [name].  Last chemo was September 2022.   Insulin-dependent type 2 diabetes with hyperglycemia, poorly controlled -Poorly controlled.  Hemoglobin A1c is 11.4.   Essential hypertension- -Continue Norvasc, Coreg   Body mass index is 24.8 kg/m.         Discharge Diagnoses:  Principal Problem:   GI bleed Active Problems:   Severe sepsis (HCC)   AKI (acute kidney injury) ([place])   Acute metabolic encephalopathy   Hypokalemia   Encephalopathy   Nausea and vomiting   Coffee ground emesis   Elevated bilirubin   Malignant neoplasm of colon [facility])      Consultations: Gastroenterology  Subjective: Feeling much better, no complaints.  He  states he has had 1-2 bowel movement over the last 24 hours.  He does not want to take tapwater enema this morning.  Tells me coffee makes him feel better and help him have a bowel movement.  Discharge Exam: Vitals:   04/14/21 2106 04/15/21 0644   BP: (!) 171/95 (!) 152/88  Pulse: 68 64  Resp: 18 18  Temp: 98.9 F (37.2 C) 98.5 F (36.9 C)  SpO2: 98% 98%   Vitals:   04/14/21 1121 04/14/21 1639 04/14/21 2106 04/15/21 0644  BP: (!) 143/88 (!) 155/93 (!) 171/95 (!) 152/88  Pulse: 72 72 68 64  Resp: 20 20 18 18   Temp: 98 F (36.7 C) 98.5 F (36.9 C) 98.9 F (37.2 C) 98.5 F (36.9 C)  TempSrc: Axillary Oral Oral Oral  SpO2: 98%  98% 98%  Weight:      Height:        General: Pt is alert, awake, not in acute distress Cardiovascular: RRR, S1/S2 +, no rubs, no gallops Respiratory: CTA bilaterally, no wheezing, no rhonchi Abdominal: Soft, NT, ND, bowel sounds + Extremities: no edema, no cyanosis  Discharge Instructions   Allergies as of 04/15/2021       Reactions   Bee Venom Swelling   Cold Sweats   Latex Rash        Medication List     STOP taking these medications    HYDROcodone-acetaminophen 5-325 MG tablet Commonly known as: NORCO/VICODIN       TAKE these medications    Accu-Chek Guide test strip Generic drug: glucose blood USE AS INSTRUCTED TO CHECK BLOOD SUGAR 2 TIMES DAILY   amLODipine 5 MG tablet Commonly known as: NORVASC Take 1 tablet (5 mg total) by mouth daily.   atorvastatin 10 MG tablet Commonly known as: LIPITOR Take 10 mg by mouth at bedtime.   baclofen 10 MG tablet Commonly known as: LIORESAL Take 1 tablet (10 mg total) by mouth 3 (three) times daily.   carvedilol 3.125 MG tablet Commonly known as: COREG TAKE 1 TABLET BY MOUTH 2 TIMES DAILY WITH A MEAL   dexamethasone 4 MG tablet Commonly known as: DECADRON Take 1 tablet (4 mg total) by mouth 2 (two) times daily. Take for 2 days after each chemotherapy treatment. Start on day 3 of each cycle   Insulin Glargine-Lixisenatide 100-33 UNT-MCG/ML Sopn Inject 10-15 Units into the skin See admin instructions. Inject 10 units into the skin in the morning and 15 units in the evening   Klor-Con M20 20 MEQ tablet Generic drug:  potassium chloride SA TAKE 1 TABLET BY MOUTH EVERY DAY What changed: how much to take   lidocaine-prilocaine cream Commonly known as: EMLA Apply 1 application topically as directed. Apply to port site 1-2 hours prior to stick and cover with plastic wrap to numb site   LORazepam 0.5 MG tablet Commonly known as: ATIVAN Take 1 tablet (0.5 mg total) by mouth every 8 (eight) hours as needed (nausea).   ondansetron 8 MG tablet Commonly known as: ZOFRAN Take 1 tablet (8 mg total) by mouth every 8 (eight) hours as needed for nausea or vomiting. Start 72 hours after IV chemotherapy treatment   pantoprazole 40 MG tablet Commonly known as: PROTONIX Take 40 mg by mouth 2 (two) times daily.   polycarbophil 625 MG tablet Commonly known as: FIBERCON Take 1 tablet (625 mg total) by mouth 2 (two) times daily. What changed: when to take this   polyethylene glycol 17 g packet Commonly known  as: MIRALAX / GLYCOLAX Take 17 g by mouth daily as needed for mild constipation.   prochlorperazine 10 MG tablet Commonly known as: COMPAZINE Take 1 tablet (10 mg total) by mouth every 6 (six) hours as needed for nausea or vomiting.   senna-docusate 8.6-50 MG tablet Commonly known as: Senokot-S Take 2 tablets by mouth at bedtime as needed for moderate constipation.   sucralfate 1 GM/10ML suspension Commonly known as: CARAFATE Take 10 mLs (1 g total) by mouth 2 (two) times daily.        Follow-up [place] Follow up.   Why: [place] [phone] nursing/physical therapy. Contact information: [address] [place] [address] [phone]         [name], MD Follow up in 1 week(s).   Specialty: Family Medicine Contact information: [place] [id number] [phone]         Skeet Latch, MD .   Specialty: Cardiology Contact information: [address] [place]  [address] [place] [address] [phone]                Allergies  Allergen Reactions   Bee Venom Swelling    Cold Sweats   Latex Rash    You were cared for by a hospitalist during your hospital stay. If you have any questions about your discharge medications or the care you received while you were in the hospital after you are discharged, you can call the unit and asked to speak with the hospitalist on call if the hospitalist that took care of you is not available. Once you are discharged, your primary care physician will handle any further medical issues. Please note that no refills for any discharge medications will be authorized once you are discharged, as it is imperative that you return to your primary care physician (or establish a relationship with a primary care physician if you do not have one) for your aftercare needs so that they can reassess your need for medications and monitor your lab values.   Procedures/Studies: CT ABDOMEN PELVIS WO CONTRAST  Result Date: 04/02/2021 CLINICAL DATA:  Suspected bowel obstruction. Under treatment for colon cancer. EXAM: CT ABDOMEN AND PELVIS WITHOUT CONTRAST TECHNIQUE: Multidetector CT imaging of the abdomen and pelvis was performed following the standard protocol without IV contrast. COMPARISON:  12/03/2020. FINDINGS: Lower chest: The heart is normal in size and there is a trace pericardial effusion. The distal tip of a central venous catheter terminates in the right atrium. Dependent atelectasis is noted at the lung bases. Hepatobiliary: No focal liver abnormality is seen. No gallstones, gallbladder wall thickening, or biliary dilatation. Pancreas: Unremarkable. No pancreatic ductal dilatation or surrounding inflammatory changes. Spleen: Normal in size without focal abnormality. Adrenals/Urinary Tract: No adrenal nodule or mass. No renal or ureteral calculus or obstructive uropathy bilaterally. There is a cyst in the lower pole the right kidney  measuring 1.7 cm. The urinary bladder is partially distended and a Foley catheter is noted. Stomach/Bowel: There is a small hiatal hernia with diffuse thickening in the mid to distal esophagus. No bowel obstruction, free air or pneumatosis. The patient is status post left hemicolectomy with an anastomotic site in the mid left abdomen. Evaluation of the bowel is limited due to lack of IV and oral contrast. The appendix is not visualized on exam. Vascular/Lymphatic: No significant vascular findings are present. No enlarged abdominal or pelvic lymph nodes. Reproductive:  The prostate gland is enlarged measuring 6.1 cm in diameter. Other: No free fluid. There is a small fat containing inguinal hernia on the left. Musculoskeletal: No acute fracture or suspicious osseous abnormality. IMPRESSION: 1. No bowel obstruction or free air. 2. Small hiatal hernia with diffuse thickening of the mid to distal esophago is which may be infectious or inflammatory. 3. Status post left hemicolectomy. Evaluation of the bowel is somewhat limited due to lack of IV and oral contrast. 4. Right renal cyst. 5. Enlarged prostate gland. Electronically Signed   By: Brett Fairy M.D.   On: 04/02/2021 00:43   DG Chest 2 View  Result Date: 04/02/2021 CLINICAL DATA:  Vomiting, colon cancer EXAM: CHEST - 2 VIEW COMPARISON:  04/01/2021 FINDINGS: Patient is rotated. Lungs are clear.  No pleural effusion or pneumothorax. Heart is top-normal in size. Left chest port terminates cavoatrial junction. IMPRESSION: No evidence of acute cardiopulmonary disease. Electronically Signed   By: Julian Hy M.D.   On: 04/02/2021 02:46   DG Abd 1 View  Result Date: 04/14/2021 CLINICAL DATA:  Nausea and vomiting EXAM: ABDOMEN - 1 VIEW COMPARISON:  Abdominal x-ray 04/10/2021 FINDINGS: Bowel-gas pattern appears within normal limits. No abnormally distended gas-filled loops of bowel to suggest obstruction. Moderate to large amount of retained fecal material  in the colon and rectum. No suspicious calcifications identified. IMPRESSION: No acute abnormality identified. Moderate to large amount of retained fecal material in the colon and rectum. Electronically Signed   By: Ofilia Neas M.D.   On: 04/14/2021 13:16   DG Abd 1 View  Result Date: 04/07/2021 CLINICAL DATA:  Abdominal distension. EXAM: ABDOMEN - 1 VIEW COMPARISON:  CT 04/02/2021 FINDINGS: No bowel dilatation to suggest obstruction. Moderate stool in the ascending and transverse colon, small volume of distal stool. Enteric sutures at the splenic flexure. No radiopaque calculi or abnormal soft tissue calcifications. No evidence of free air. Unremarkable osseous structures. IMPRESSION: No explanation for abdominal distension. No bowel obstruction. Small to moderate colonic stool burden. Electronically Signed   By: Keith Rake M.D.   On: 04/07/2021 15:33   CT Head Wo Contrast  Result Date: 04/02/2021 CLINICAL DATA:  Delirium, altered mental status. EXAM: CT HEAD WITHOUT CONTRAST TECHNIQUE: Contiguous axial images were obtained from the base of the skull through the vertex without intravenous contrast. COMPARISON:  11/27/2005. FINDINGS: Brain: No acute intracranial hemorrhage, midline shift or mass effect. Generalized atrophy is noted. No extra-axial fluid collection is seen. Gray-white matter differentiation is within normal limits and there is no hydrocephalus. Vascular: No hyperdense vessel or unexpected calcification. Skull: Normal. Negative for fracture or focal lesion. Sinuses/Orbits: No acute finding. Other: None. IMPRESSION: No acute intracranial process. Electronically Signed   By: Brett Fairy M.D.   On: 04/02/2021 00:32   CT CHEST WO CONTRAST  Result Date: 04/08/2021 CLINICAL DATA:  Gastrointestinal cancer EXAM: CT CHEST WITHOUT CONTRAST TECHNIQUE: Multidetector CT imaging of the chest was performed following the standard protocol without IV contrast. COMPARISON:  04/02/2021,  12/03/2020 FINDINGS: Cardiovascular: Unenhanced imaging of the heart and great vessels demonstrates no pericardial effusion. Normal caliber of the thoracic aorta. Left chest wall port via subclavian approach, tip within the superior vena cava. Evaluation of the vascular lumen is limited without IV contrast. Mediastinum/Nodes: No enlarged mediastinal or axillary lymph nodes. Thyroid gland, trachea, and esophagus demonstrate no significant findings. Lungs/Pleura: No acute airspace disease, effusion, or pneumothorax. Scattered scarring versus subsegmental atelectasis at the lung bases, stable. Central airways are patent. Upper  Abdomen: No acute abnormality. Musculoskeletal: No acute or destructive bony lesions. Reconstructed images demonstrate no additional findings. IMPRESSION: 1. No evidence of intrathoracic metastases. No acute intrathoracic process. Electronically Signed   By: Randa Ngo M.D.   On: 04/08/2021 19:23   DG Chest Port 1 View  Result Date: 04/01/2021 CLINICAL DATA:  Coffee-ground emesis EXAM: PORTABLE CHEST 1 VIEW COMPARISON:  08/24/2020 FINDINGS: Left-sided central venous port tip over the right atrium. Incompletely included left lung base. Cardiomegaly without definitive acute airspace consolidation, pleural effusion or pneumothorax. IMPRESSION: Cardiomegaly. No definite acute airspace disease allowing for positioning and incomplete inclusion of left base. Electronically Signed   By: Donavan Foil M.D.   On: 04/01/2021 22:21   DG Abd Portable 1V  Result Date: 04/10/2021 CLINICAL DATA:  Nausea and vomiting EXAM: PORTABLE ABDOMEN - 1 VIEW COMPARISON:  04/07/2021 FINDINGS: Stomach is not distended. There is no significant small bowel dilation. Moderate amount of stool is seen in the ascending and transverse colon. There is no fecal impaction in the rectosigmoid. Surgical staples are seen in the transverse colon close to the splenic flexure. IMPRESSION: There is no significant small bowel  dilation. Moderate amount of stool is seen in the colon. There is no fecal impaction in the rectosigmoid. Electronically Signed   By: Elmer Picker M.D.   On: 04/10/2021 11:54   ECHOCARDIOGRAM COMPLETE  Result Date: 04/03/2021    ECHOCARDIOGRAM REPORT   Patient Name:   Jeffrey Young Date of Exam: 04/03/2021 Medical Rec #:  [id number]      Height:       73.0 in Accession #:    [id number]     Weight:       188.3 lb Date of Birth:  07/02/1965      BSA:          2.097 m Patient Age:    17 years       BP:           150/95 mmHg Patient Gender: M              HR:           75 bpm. Exam Location:  Inpatient Procedure: 2D Echo, Cardiac Doppler and Color Doppler Indications:    R94.31 Abnormal EKG  History:        Patient has prior history of Echocardiogram examinations, most                 recent 07/25/2020. Signs/Symptoms:Shortness of Breath; Risk                 Factors:Diabetes and Hypertension.  Sonographer:    Glo Herring Referring Phys: [place]  1. Left ventricular ejection fraction, by estimation, is 60 to 65%. The left ventricle has normal function. The left ventricle has no regional wall motion abnormalities. There is mild left ventricular hypertrophy. Left ventricular diastolic parameters are consistent with Grade I diastolic dysfunction (impaired relaxation).  2. Right ventricular systolic function is normal. The right ventricular size is normal.  3. The mitral valve is normal in structure. No evidence of mitral valve regurgitation. No evidence of mitral stenosis.  4. The aortic valve is normal in structure. Aortic valve regurgitation is not visualized. No aortic stenosis is present.  5. The inferior vena cava is normal in size with greater than 50% respiratory variability, suggesting right atrial pressure of 3 mmHg. Comparison(s): Prior images unable to be directly viewed, comparison made by report only. Changes from  prior study are noted. FINDINGS  Left Ventricle: Left  ventricular ejection fraction, by estimation, is 60 to 65%. The left ventricle has normal function. The left ventricle has no regional wall motion abnormalities. The left ventricular internal cavity size was normal in size. There is  mild left ventricular hypertrophy. Left ventricular diastolic parameters are consistent with Grade I diastolic dysfunction (impaired relaxation). Right Ventricle: The right ventricular size is normal. No increase in right ventricular wall thickness. Right ventricular systolic function is normal. Left Atrium: Left atrial size was normal in size. Right Atrium: Right atrial size was normal in size. Pericardium: There is no evidence of pericardial effusion. Mitral Valve: The mitral valve is normal in structure. No evidence of mitral valve regurgitation. No evidence of mitral valve stenosis. Tricuspid Valve: The tricuspid valve is normal in structure. Tricuspid valve regurgitation is not demonstrated. No evidence of tricuspid stenosis. Aortic Valve: The aortic valve is normal in structure. Aortic valve regurgitation is not visualized. No aortic stenosis is present. Aortic valve mean gradient measures 3.0 mmHg. Aortic valve peak gradient measures 5.6 mmHg. Aortic valve area, by VTI measures 2.91 cm. Pulmonic Valve: The pulmonic valve was normal in structure. Pulmonic valve regurgitation is not visualized. No evidence of pulmonic stenosis. Aorta: The aortic root is normal in size and structure. Venous: The inferior vena cava is normal in size with greater than 50% respiratory variability, suggesting right atrial pressure of 3 mmHg. IAS/Shunts: No atrial level shunt detected by color flow Doppler.  LEFT VENTRICLE PLAX 2D LVIDd:         4.10 cm   Diastology LVIDs:         2.90 cm   LV e' medial:    4.35 cm/s LV PW:         1.20 cm   LV E/e' medial:  18.1 LV IVS:        1.20 cm   LV e' lateral:   5.98 cm/s LVOT diam:     2.00 cm   LV E/e' lateral: 13.2 LV SV:         58 LV SV Index:   28 LVOT  Area:     3.14 cm  IVC IVC diam: 2.00 cm LEFT ATRIUM             Index LA diam:        2.90 cm 1.38 cm/m LA Vol (A2C):   55.0 ml 26.25 ml/m LA Vol (A4C):   40.2 ml 19.17 ml/m LA Biplane Vol: 53.2 ml 25.37 ml/m  AORTIC VALVE                    PULMONIC VALVE AV Area (Vmax):    2.96 cm     PV Vmax:       1.03 m/s AV Area (Vmean):   3.00 cm     PV Peak grad:  4.3 mmHg AV Area (VTI):     2.91 cm AV Vmax:           118.00 cm/s AV Vmean:          74.900 cm/s AV VTI:            0.201 m AV Peak Grad:      5.6 mmHg AV Mean Grad:      3.0 mmHg LVOT Vmax:         111.00 cm/s LVOT Vmean:        71.600 cm/s LVOT VTI:  0.186 m LVOT/AV VTI ratio: 0.93  AORTA Ao Root diam: 3.60 cm MITRAL VALVE MV Area (PHT): 3.36 cm     SHUNTS MV Decel Time: 226 msec     Systemic VTI:  0.19 m MV E velocity: 78.80 cm/s   Systemic Diam: 2.00 cm MV A velocity: 111.00 cm/s MV E/A ratio:  0.71 Candee Furbish MD Electronically signed by Candee Furbish MD Signature Date/Time: 04/03/2021/12:47:19 PM    Final    US Abdomen Limited RUQ (LIVER/GB)  Result Date: 04/02/2021 CLINICAL DATA:  55 year old male with history of elevated liver function tests. EXAM: ULTRASOUND ABDOMEN LIMITED RIGHT UPPER QUADRANT COMPARISON:  Renal ultrasound 07/26/2020. FINDINGS: Gallbladder: No gallstones or wall thickening visualized. No sonographic Murphy sign noted by sonographer. Common bile duct: Diameter: 4 mm Liver: No focal lesion identified. Within normal limits in parenchymal echogenicity. Portal vein is patent on color Doppler imaging with normal direction of blood flow towards the liver. Other: Anechoic lesion with increased through transmission in the right kidney measuring 2.2 x 2.2 x 1.7 cm, compatible with a simple cyst. IMPRESSION: 1. No acute findings to account for the patient's symptoms. Specifically, no signs of biliary tract obstruction. No cholelithiasis or evidence of acute cholecystitis. Electronically Signed   By: Vinnie Langton M.D.   On:  04/02/2021 08:03     The results of significant diagnostics from this hospitalization (including imaging, microbiology, ancillary and laboratory) are listed below for reference.     Microbiology: No results found for this or any previous visit (from the past 240 hour(s)).   Labs: BNP (last 3 results) Recent Labs    07/24/20 2125  BNP 323.5*   Basic Metabolic Panel: Recent Labs  Lab 04/09/21 0533 04/10/21 0353 04/11/21 0300 04/12/21 0425 04/13/21 0345  NA 135 137 135 136 134*  K 4.3 3.7 3.6 3.7 3.6  CL 107 105 102 103 101  CO2 24 27 28 28 29   GLUCOSE 211* 112* 138* 187* 171*  BUN 16 13 13 15 14   CREATININE 1.39* 1.43* 1.55* 1.76* 1.56*  CALCIUM 7.8* 8.6* 8.3* 8.3* 8.4*  MG 2.1 2.0 1.8 1.8 1.8  PHOS  --   --   --   --  3.5   Liver Function Tests: No results for input(s): AST, ALT, ALKPHOS, BILITOT, PROT, ALBUMIN in the last 168 hours. No results for input(s): LIPASE, AMYLASE in the last 168 hours. No results for input(s): AMMONIA in the last 168 hours. CBC: Recent Labs  Lab 04/09/21 0533 04/10/21 0353 04/11/21 0300 04/12/21 0425 04/13/21 0345  WBC 6.7 6.6 6.6 6.0 6.2  HGB 8.1* 9.0* 8.7* 8.8* 8.3*  HCT 25.1* 27.1* 26.5* 26.9* 25.4*  MCV 97.3 97.5 97.1 97.8 97.3  PLT 157 194 208 209 190   Cardiac Enzymes: No results for input(s): CKTOTAL, CKMB, CKMBINDEX, TROPONINI in the last 168 hours. BNP: Invalid input(s): POCBNP CBG: Recent Labs  Lab 04/14/21 0724 04/14/21 1117 04/14/21 1618 04/14/21 2102 04/15/21 0758  GLUCAP 113* 126* 88 135* 124*   D-Dimer No results for input(s): DDIMER in the last 72 hours. Hgb A1c No results for input(s): HGBA1C in the last 72 hours. Lipid Profile No results for input(s): CHOL, HDL, LDLCALC, TRIG, CHOLHDL, LDLDIRECT in the last 72 hours. Thyroid function studies No results for input(s): TSH, T4TOTAL, T3FREE, THYROIDAB in the last 72 hours.  Invalid input(s): FREET3 Anemia work up No results for input(s): VITAMINB12,  FOLATE, FERRITIN, TIBC, IRON, RETICCTPCT in the last 72 hours. Urinalysis  Component Value Date/Time   COLORURINE YELLOW 04/01/2021 2340   APPEARANCEUR CLEAR 04/01/2021 2340   LABSPEC 1.031 (H) 04/01/2021 2340   PHURINE 6.0 04/01/2021 2340   GLUCOSEU >=500 (A) 04/01/2021 2340   HGBUR SMALL (A) 04/01/2021 2340   BILIRUBINUR NEGATIVE 04/01/2021 2340   KETONESUR NEGATIVE 04/01/2021 2340   PROTEINUR >=300 (A) 04/01/2021 2340   NITRITE NEGATIVE 04/01/2021 2340   LEUKOCYTESUR NEGATIVE 04/01/2021 2340   Sepsis Labs Invalid input(s): PROCALCITONIN,  WBC,  LACTICIDVEN Microbiology No results found for this or any previous visit (from the past 240 hour(s)).   Time coordinating discharge:  I have spent 35 minutes face to face with the patient and on the ward discussing the patients care, assessment, plan and disposition with other care givers. >50% of the time was devoted counseling the patient about the risks and benefits of treatment/Discharge disposition and coordinating care.   SIGNED:   Damita Lack, MD  [facility] 04/15/2021, 11:46 AM   If 7PM-7AM, please contact night-coverage

## 2021-04-15 NOTE — Progress Notes (Signed)
Patient refused enema from dayshift, but did have a bowel movement during the nightshift.

## 2021-04-15 NOTE — TOC Transition Note (Signed)
Transition of Care [facility]) - CM/SW Discharge Note   Patient Details  Name: Jeffrey Young MRN: [id number] Date of Birth: 1966-05-18  Transition of Care [facility]) CM/SW Contact:  Dessa Phi, RN Phone Number: 04/15/2021, 12:07 PM   Clinical Narrative:Patient's health insurance termintated 04/13/21-not eligible for HHPT unless out of pocket cost. Will check with Alvis Lemmings if qualifies for charity;last resport may be otpt PT. Patient/spouse/MD updated.Await outcome.       Final next level of care: [place] Barriers to Discharge: No Barriers Identified   Patient Goals and CMS Choice Patient states their goals for this hospitalization and ongoing recovery are:: to go home CMS Medicare.gov Compare Post Acute Care list provided to:: Patient    Discharge Placement                       Discharge Plan and Services   Discharge Planning Services: CM Consult                      HH Arranged: PT, RN [facility] Agency: [place] ([place]) Date HH Agency Contacted: 04/14/21 Time [place]: 1033 Representative spoke with at [place]: [place] Determinants of Health ([place]) Interventions     Readmission Risk Interventions No flowsheet data found.

## 2021-04-15 NOTE — Progress Notes (Addendum)
Subjective: Since I last evaluated the patient, he said he has been tolerating full liquid diet well.  He denies having abdominal pain nausea vomiting.  He had a bowel movement earlier today.  Dr. Reesa Chew plans to discharge him home today.  Objective: Vital signs in last 24 hours: Temp:  [98 F (36.7 C)-98.9 F (37.2 C)] 98.5 F (36.9 C) (11/25 0644) Pulse Rate:  [64-72] 64 (11/25 0644) Resp:  [18-20] 18 (11/25 0644) BP: (143-171)/(88-95) 152/88 (11/25 0644) SpO2:  [98 %] 98 % (11/25 0644) Last BM Date: 04/02/21  Intake/Output from previous day: 11/24 0701 - 11/25 0700 In: 1370 [P.O.:1320; IV Piggyback:50] Out: 900 [Urine:900] Intake/Output this shift: No intake/output data recorded.  General appearance: alert, cooperative, appears older than stated age, no distress, and moderately obese Resp: clear to auscultation bilaterally Cardio: regular rate and rhythm, S1, S2 normal, no murmur, click, rub or gallop GI: soft, non-tender; bowel sounds normal; no masses,  no organomegaly  Lab Results: Recent Labs    04/13/21 0345  WBC 6.2  HGB 8.3*  HCT 25.4*  PLT 190   BMET Recent Labs    04/13/21 0345  NA 134*  K 3.6  CL 101  CO2 29  GLUCOSE 171*  BUN 14  CREATININE 1.56*  CALCIUM 8.4*   LStudies/Results: DG Abd 1 View  Result Date: 04/14/2021 CLINICAL DATA:  Nausea and vomiting EXAM: ABDOMEN - 1 VIEW COMPARISON:  Abdominal x-ray 04/10/2021 FINDINGS: Bowel-gas pattern appears within normal limits. No abnormally distended gas-filled loops of bowel to suggest obstruction. Moderate to large amount of retained fecal material in the colon and rectum. No suspicious calcifications identified. IMPRESSION: No acute abnormality identified. Moderate to large amount of retained fecal material in the colon and rectum. Electronically Signed   By: Ofilia Neas M.D.   On: 04/14/2021 13:16    Medications: I have reviewed the patient's current medications. Prior to Admission:   Medications Prior to Admission  Medication Sig Dispense Refill Last Dose   atorvastatin (LIPITOR) 10 MG tablet Take 10 mg by mouth at bedtime.   unknown   carvedilol (COREG) 3.125 MG tablet TAKE 1 TABLET BY MOUTH 2 TIMES DAILY WITH A MEAL (Patient taking differently: Take 3.125 mg by mouth 2 (two) times daily with a meal.) 180 tablet 1 unknown   dexamethasone (DECADRON) 4 MG tablet Take 1 tablet (4 mg total) by mouth 2 (two) times daily. Take for 2 days after each chemotherapy treatment. Start on day 3 of each cycle 24 tablet 0 Past Month   Insulin Glargine-Lixisenatide 100-33 UNT-MCG/ML SOPN Inject 10-15 Units into the skin See admin instructions. Inject 10 units into the skin in the morning and 15 units in the evening   04/01/2021 at 1830   KLOR-CON M20 20 MEQ tablet TAKE 1 TABLET BY MOUTH EVERY DAY (Patient taking differently: Take 20 mEq by mouth daily.) 90 tablet 0 unknown   lidocaine-prilocaine (EMLA) cream Apply 1 application topically as directed. Apply to port site 1-2 hours prior to stick and cover with plastic wrap to numb site 30 g 2 Past Month   LORazepam (ATIVAN) 0.5 MG tablet Take 1 tablet (0.5 mg total) by mouth every 8 (eight) hours as needed (nausea). 20 tablet 0 04/01/2021   ondansetron (ZOFRAN) 8 MG tablet Take 1 tablet (8 mg total) by mouth every 8 (eight) hours as needed for nausea or vomiting. Start 72 hours after IV chemotherapy treatment 30 tablet 1 Past Month   pantoprazole (PROTONIX) 40 MG tablet  Take 40 mg by mouth 2 (two) times daily.   unknown   polycarbophil (FIBERCON) 625 MG tablet Take 1 tablet (625 mg total) by mouth 2 (two) times daily. (Patient taking differently: Take 625 mg by mouth daily.) 60 tablet 0 unknown   prochlorperazine (COMPAZINE) 10 MG tablet Take 1 tablet (10 mg total) by mouth every 6 (six) hours as needed for nausea or vomiting. 60 tablet 1 unknown   ACCU-CHEK GUIDE test strip USE AS INSTRUCTED TO CHECK BLOOD SUGAR 2 TIMES DAILY      amLODipine  (NORVASC) 5 MG tablet Take 1 tablet (5 mg total) by mouth daily. 30 tablet 0 unknown   HYDROcodone-acetaminophen (NORCO/VICODIN) 5-325 MG tablet Take 1 tablet by mouth every 6 (six) hours as needed for moderate pain. (Patient not taking: No sig reported) 15 tablet 0 Not Taking   Scheduled:  amLODipine  5 mg Oral Daily   baclofen  10 mg Oral TID   carvedilol  3.125 mg Oral BID WC   chlorhexidine  15 mL Mouth Rinse BID   Chlorhexidine Gluconate Cloth  6 each Topical Daily   docusate sodium  100 mg Oral BID   feeding supplement (GLUCERNA SHAKE)  237 mL Oral TID BM   insulin aspart  0-15 Units Subcutaneous TID WC   insulin aspart  0-5 Units Subcutaneous QHS   insulin glargine-yfgn  10 Units Subcutaneous Daily   mouth rinse  15 mL Mouth Rinse q12n4p   multivitamin with minerals  1 tablet Oral Daily   pantoprazole  40 mg Oral BID   polyethylene glycol  17 g Oral Daily   Ensure Max Protein  11 oz Oral Daily   senna-docusate  2 tablet Oral BID   sodium chloride flush  10-40 mL Intracatheter Q12H   sodium chloride flush  10-40 mL Intracatheter Q12H   sucralfate  1 g Oral BID   Continuous:  promethazine (PHENERGAN) injection (IM or IVPB) 12.5 mg (04/14/21 1646)   Assessment/Plan: 1) LA grade D esophagitis-on sucralfate and double dose PPI PO and Sucralfate twice daily, which he seems to be tolerating well. I feel he should be maintained on a full full liquid diet for a few days till his esophagitis improves before trying him on regular food.  As per my discussion with Dr. Reesa Chew, feel the patient is ready for discharge at the present time. 2) Chronic constipation-on Colace and MiraLAX instead.  Liberal fluid intake has been advised. 3) Personal history of colon cancer-patient finished his last chemo in September this year. 4) AKI on chronic kidney disease stage IIIa 5) IDDM 6) HTN. 7) Acute metabolic encephalopathy-improved. 8) Anemia of chronic disease. He should see Dr. Carol Ada within  2 weeks of discharge for further recommendations.  LOS: 13 days   Juanita Craver 04/15/2021, 11:09 AM

## 2021-04-18 ENCOUNTER — Telehealth: Payer: Self-pay | Admitting: *Deleted

## 2021-04-18 NOTE — Telephone Encounter (Signed)
Called [employer] to inform them he was just discharged from hospital on 06/15/20 and will see Dr. Benay Spice on 05/05/21, thus return to work date is potentially 05/09/21. Jeffrey Young will update his file. Case #[id number] Spoke with [name] and she reports she needs to bring in a form for nurse to complete for his continued disability. He missed his appointment w/social security and she is working on rescheduling. Also [place] stated he is not insured and she received an email from his employer that the is still considered an employee and is insured. She will bring in this email as well when she comes in. His home health agency told them as well he has no insurance. She has also signed him up for [name]. He is on full liquid diet with some soft foods. Has only vomited X 1 this weekend. Confirmed she is working on getting him seen by PCP and GI. Confirmed her f/u with Dr. Benay Spice for 05/05/21.

## 2021-04-20 ENCOUNTER — Other Ambulatory Visit: Payer: Self-pay | Admitting: Oncology

## 2021-04-20 DIAGNOSIS — C185 Malignant neoplasm of splenic flexure: Secondary | ICD-10-CM

## 2021-04-25 NOTE — Progress Notes (Signed)
Severe Sepsis is now resolved

## 2021-04-29 ENCOUNTER — Encounter: Payer: Self-pay | Admitting: *Deleted

## 2021-04-29 NOTE — Progress Notes (Signed)
Received orders for home PT per [facility] for Dr. Benay Spice to sign. Called [facility] to inquire who ordered this--it was not Dr. Benay Spice. Was informed it was hospital referral and sent to his PCP. Provided name/phone number of his PCP. Dr. Benay Spice is not his PCP and he has completed treatment for his cancer.

## 2021-05-04 ENCOUNTER — Encounter: Payer: Self-pay | Admitting: Oncology

## 2021-05-05 ENCOUNTER — Inpatient Hospital Stay: Payer: 59 | Attending: Oncology | Admitting: Oncology

## 2021-05-05 ENCOUNTER — Inpatient Hospital Stay: Payer: 59

## 2021-05-05 ENCOUNTER — Other Ambulatory Visit: Payer: Self-pay

## 2021-05-05 ENCOUNTER — Telehealth: Payer: Self-pay | Admitting: *Deleted

## 2021-05-05 ENCOUNTER — Encounter: Payer: Self-pay | Admitting: Oncology

## 2021-05-05 VITALS — BP 160/94 | HR 92 | Temp 98.1°F | Resp 18 | Ht 73.0 in | Wt 193.0 lb

## 2021-05-05 DIAGNOSIS — Z85038 Personal history of other malignant neoplasm of large intestine: Secondary | ICD-10-CM | POA: Diagnosis present

## 2021-05-05 DIAGNOSIS — Z8719 Personal history of other diseases of the digestive system: Secondary | ICD-10-CM | POA: Insufficient documentation

## 2021-05-05 DIAGNOSIS — N289 Disorder of kidney and ureter, unspecified: Secondary | ICD-10-CM | POA: Diagnosis not present

## 2021-05-05 DIAGNOSIS — T451X5D Adverse effect of antineoplastic and immunosuppressive drugs, subsequent encounter: Secondary | ICD-10-CM | POA: Diagnosis not present

## 2021-05-05 DIAGNOSIS — I5032 Chronic diastolic (congestive) heart failure: Secondary | ICD-10-CM | POA: Insufficient documentation

## 2021-05-05 DIAGNOSIS — C185 Malignant neoplasm of splenic flexure: Secondary | ICD-10-CM

## 2021-05-05 DIAGNOSIS — Z95828 Presence of other vascular implants and grafts: Secondary | ICD-10-CM

## 2021-05-05 DIAGNOSIS — I110 Hypertensive heart disease with heart failure: Secondary | ICD-10-CM | POA: Insufficient documentation

## 2021-05-05 DIAGNOSIS — G620 Drug-induced polyneuropathy: Secondary | ICD-10-CM | POA: Diagnosis not present

## 2021-05-05 DIAGNOSIS — Z23 Encounter for immunization: Secondary | ICD-10-CM | POA: Insufficient documentation

## 2021-05-05 DIAGNOSIS — Z9221 Personal history of antineoplastic chemotherapy: Secondary | ICD-10-CM | POA: Insufficient documentation

## 2021-05-05 DIAGNOSIS — E119 Type 2 diabetes mellitus without complications: Secondary | ICD-10-CM | POA: Diagnosis not present

## 2021-05-05 DIAGNOSIS — Z452 Encounter for adjustment and management of vascular access device: Secondary | ICD-10-CM | POA: Diagnosis present

## 2021-05-05 MED ORDER — SODIUM CHLORIDE 0.9% FLUSH
10.0000 mL | Freq: Once | INTRAVENOUS | Status: AC
  Administered 2021-05-05: 10 mL via INTRAVENOUS

## 2021-05-05 MED ORDER — INFLUENZA VAC SPLIT QUAD 0.5 ML IM SUSY
0.5000 mL | PREFILLED_SYRINGE | Freq: Once | INTRAMUSCULAR | Status: AC
  Administered 2021-05-05: 0.5 mL via INTRAMUSCULAR
  Filled 2021-05-05: qty 0.5

## 2021-05-05 MED ORDER — HEPARIN SOD (PORK) LOCK FLUSH 100 UNIT/ML IV SOLN
500.0000 [IU] | Freq: Once | INTRAVENOUS | Status: AC
  Administered 2021-05-05: 500 [IU] via INTRAVENOUS

## 2021-05-05 NOTE — Telephone Encounter (Signed)
Completed disability form for employer faxed to [employer]: attention to [employer] in [place] [phone]. Completed disability claim form for [place] faxed to [phone]. Copy to HIM to scan and wife requests original be mailed to home.

## 2021-05-05 NOTE — Progress Notes (Signed)
[facility] OFFICE PROGRESS NOTE   Diagnosis: Colon cancer  INTERVAL HISTORY:   Jeffrey Young returns for a scheduled visit.  He was hospitalized last month with evidence of upper GI bleeding and nausea/vomiting.  An endoscopy on 04/05/2021 revealed esophagitis with no bleeding.  Jeffrey Young denies recurrent nausea and vomiting since discharge from the hospital.  He continues to have numbness in the fingers.  This interferes with fine motor activities.  He has difficulty buttoning his shirt and handling screws and bolts.  He is unable to perform his work activity due to the neuropathy symptoms.  Objective:  Vital signs in last 24 hours:  Blood pressure (!) 160/94, pulse 92, temperature 98.1 F (36.7 C), temperature source Oral, resp. rate 18, height 6' 1" (1.854 m), weight 193 lb (87.5 kg), SpO2 100 %.    Lymphatics: No cervical, supraclavicular, axillary, or inguinal nodes Resp: Lungs clear bilaterally Cardio: Regular rate and rhythm GI: No hepatosplenomegaly, no mass, nontender Vascular: No leg edema    Portacath/PICC-without erythema  Lab Results:  Lab Results  Component Value Date   WBC 6.2 04/13/2021   HGB 8.3 (L) 04/13/2021   HCT 25.4 (L) 04/13/2021   MCV 97.3 04/13/2021   PLT 190 04/13/2021   NEUTROABS 12.7 (H) 04/01/2021    CMP  Lab Results  Component Value Date   NA 134 (L) 04/13/2021   K 3.6 04/13/2021   CL 101 04/13/2021   CO2 29 04/13/2021   GLUCOSE 171 (H) 04/13/2021   BUN 14 04/13/2021   CREATININE 1.56 (H) 04/13/2021   CALCIUM 8.4 (L) 04/13/2021   PROT 6.2 (L) 04/03/2021   ALBUMIN 2.6 (L) 04/03/2021   AST 17 04/03/2021   ALT 9 04/03/2021   ALKPHOS 113 04/03/2021   BILITOT 1.9 (H) 04/03/2021   GFRNONAA 51 (L) 04/13/2021   GFRAA >60 01/26/2018    Lab Results  Component Value Date   CEA1 11.44 (H) 09/27/2020   CEA 4.50 02/28/2021      Medications: I have reviewed the patient's current  medications.   Assessment/Plan: Descending colon cancer, stage IIIc (T3N2b M0), status post a partial left colectomy 07/30/2020, 9/16 lymph nodes positive, lymphovascular invasion, 1 satellite nodule, negative margins, MSS, no loss of mismatch repair protein expression -History of large polyp in the left side of the colon-referred to [facility] in 05/2018 for procedure canceled secondary to COVID-19 pandemic.  Procedure was not rescheduled. -CT chest/abdomen/pelvis with contrast 07/27/2020-3 small pulmonary nodules less than 5 mm favored to be benign, circumferential luminal narrowing of the distal transverse colon concerning for malignancy, no metastatic adenopathy in the mesentery porta hepatis, no for metastasis. -CEA on 07/27/2020 was 17.3; 37 on 08/30/2020; 33 on 09/13/2020 -Colonoscopy performed 07/27/2020 showed a fungating, infiltrative and ulcerated nonobstructing large mass in the proximal descending colon.  Biopsy-adenocarcinoma -Cycle 1 FOLFOX 08/30/2020 -Cycle 2 FOLFOX 09/13/2020, Emend added for delayed nausea -Cycle 3 FOLFOX 09/27/2020 -Cycle 4 FOLFOX 10/11/2020 -Cycle 5 FOLFOX 11/08/2020 -Cycle 6 FOLFOX 11/23/2020 -CT 12/03/2020-prior 3 mm left apical nodule no longer seen, no new/suspicious pulmonary nodules, no evidence of metastatic disease -Cycle 7 FOLFOX 12/06/2020 -Cycle 8 FOLFOX 12/21/2020 -Cycle 9 FOLFOX 01/03/2021 -Cycle 10 FOLFOX 01/17/2021 -Cycle 11 FOLFOX 01/31/2021, oxaliplatin held secondary to neuropathy symptoms -Cycle 12 FOLFOX 02/15/2021, oxaliplatin held secondary to neuropathy -CT abdomen/pelvis 04/02/2021-no evidence of recurrent colon cancer -CT chest 04/08/2021-no evidence of metastatic disease 2.  Anemia due to GI bleeding, iron deficiency?,  Renal insufficiency? 3.  New onset  acute diastolic CHF March 0347 4.  Diabetes mellitus 5.  Renal insufficiency 6.  Hypertension 7.  History of left transmetatarsal amputation 8.  History of colon polyps 9.  Neuropathy 10.  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  He has completed adjuvant therapy.  Restaging noncontrast CTs last month revealed no evidence of recurrent disease.  He will return for an office visit and CEA in 6-8 weeks.  The Port-A-Cath remains in place.  Jeffrey Young received an influenza vaccine today.  He will remain out of work due to oxaliplatin neuropathy symptoms  Betsy Coder, MD  05/05/2021  12:19 PM

## 2021-05-05 NOTE — Patient Instructions (Signed)
Influenza Virus Vaccine injection °What is this medication? °INFLUENZA VIRUS VACCINE (in floo EN zuh VAHY ruhs vak SEEN) helps to reduce the risk of getting influenza also known as the flu. The vaccine only helps protect you against some strains of the flu. °This medicine may be used for other purposes; ask your health care provider or pharmacist if you have questions. °COMMON BRAND NAME(S): Afluria, Afluria Quadrivalent, Agriflu, Alfuria, FLUAD, FLUAD Quadrivalent, Fluarix, Fluarix Quadrivalent, Flublok, Flublok Quadrivalent, FLUCELVAX, FLUCELVAX Quadrivalent, Flulaval, Flulaval Quadrivalent, Fluvirin, Fluzone, Fluzone High-Dose, Fluzone Intradermal, Fluzone Quadrivalent °What should I tell my care team before I take this medication? °They need to know if you have any of these conditions: °bleeding disorder like hemophilia °fever or infection °Guillain-Barre syndrome or other neurological problems °immune system problems °infection with the human immunodeficiency virus (HIV) or AIDS °low blood platelet counts °multiple sclerosis °an unusual or allergic reaction to influenza virus vaccine, latex, other medicines, foods, dyes, or preservatives. Different brands of vaccines contain different allergens. Some may contain latex or eggs. Talk to your doctor about your allergies to make sure that you get the right vaccine. °pregnant or trying to get pregnant °breast-feeding °How should I use this medication? °This vaccine is for injection into a muscle or under the skin. It is given by a health care professional. °A copy of Vaccine Information Statements will be given before each vaccination. Read this sheet carefully each time. The sheet may change frequently. °Talk to your healthcare provider to see which vaccines are right for you. Some vaccines should not be used in all age groups. °Overdosage: If you think you have taken too much of this medicine contact a poison control center or emergency room at once. °NOTE: This  medicine is only for you. Do not share this medicine with others. °What if I miss a dose? °This does not apply. °What may interact with this medication? °chemotherapy or radiation therapy °medicines that lower your immune system like etanercept, anakinra, infliximab, and adalimumab °medicines that treat or prevent blood clots like warfarin °phenytoin °steroid medicines like prednisone or cortisone °theophylline °vaccines °This list may not describe all possible interactions. Give your health care provider a list of all the medicines, herbs, non-prescription drugs, or dietary supplements you use. Also tell them if you smoke, drink alcohol, or use illegal drugs. Some items may interact with your medicine. °What should I watch for while using this medication? °Report any side effects that do not go away within 3 days to your doctor or health care professional. Call your health care provider if any unusual symptoms occur within 6 weeks of receiving this vaccine. °You may still catch the flu, but the illness is not usually as bad. You cannot get the flu from the vaccine. The vaccine will not protect against colds or other illnesses that may cause fever. The vaccine is needed every year. °What side effects may I notice from receiving this medication? °Side effects that you should report to your doctor or health care professional as soon as possible: °allergic reactions like skin rash, itching or hives, swelling of the face, lips, or tongue °Side effects that usually do not require medical attention (report to your doctor or health care professional if they continue or are bothersome): °fever °headache °muscle aches and pains °pain, tenderness, redness, or swelling at the injection site °tiredness °This list may not describe all possible side effects. Call your doctor for medical advice about side effects. You may report side effects to FDA at 1-800-FDA-1088. °  Where should I keep my medication? °The vaccine will be given  by a health care professional in a clinic, pharmacy, doctor's office, or other health care setting. You will not be given vaccine doses to store at home. °NOTE: This sheet is a summary. It may not cover all possible information. If you have questions about this medicine, talk to your doctor, pharmacist, or health care provider. °© 2022 Elsevier/Gold Standard (2021-01-25 00:00:00) ° °

## 2021-05-12 ENCOUNTER — Telehealth: Payer: Self-pay | Admitting: *Deleted

## 2021-05-12 NOTE — Telephone Encounter (Signed)
Received Home Health orders from [facility] requesting Dr. Benay Spice to sign off. Dr. Benay Spice did not order home health. Referral made upon recent hospital discharge unrelated to cancer. Need to forward this to his PCP, Dr. [name]. Notified Mariann Laster at [place].

## 2021-06-27 ENCOUNTER — Inpatient Hospital Stay: Payer: 59

## 2021-06-27 ENCOUNTER — Inpatient Hospital Stay (HOSPITAL_BASED_OUTPATIENT_CLINIC_OR_DEPARTMENT_OTHER): Payer: 59 | Admitting: Nurse Practitioner

## 2021-06-27 ENCOUNTER — Encounter: Payer: Self-pay | Admitting: Nurse Practitioner

## 2021-06-27 ENCOUNTER — Other Ambulatory Visit: Payer: Self-pay

## 2021-06-27 ENCOUNTER — Inpatient Hospital Stay: Payer: 59 | Attending: Oncology

## 2021-06-27 VITALS — BP 194/82 | HR 82 | Temp 98.1°F | Resp 20 | Ht 73.0 in | Wt 194.2 lb

## 2021-06-27 DIAGNOSIS — D649 Anemia, unspecified: Secondary | ICD-10-CM | POA: Diagnosis not present

## 2021-06-27 DIAGNOSIS — T451X5A Adverse effect of antineoplastic and immunosuppressive drugs, initial encounter: Secondary | ICD-10-CM | POA: Insufficient documentation

## 2021-06-27 DIAGNOSIS — E119 Type 2 diabetes mellitus without complications: Secondary | ICD-10-CM | POA: Insufficient documentation

## 2021-06-27 DIAGNOSIS — C185 Malignant neoplasm of splenic flexure: Secondary | ICD-10-CM

## 2021-06-27 DIAGNOSIS — Z9049 Acquired absence of other specified parts of digestive tract: Secondary | ICD-10-CM | POA: Diagnosis not present

## 2021-06-27 DIAGNOSIS — N289 Disorder of kidney and ureter, unspecified: Secondary | ICD-10-CM | POA: Diagnosis not present

## 2021-06-27 DIAGNOSIS — Z79899 Other long term (current) drug therapy: Secondary | ICD-10-CM | POA: Diagnosis not present

## 2021-06-27 DIAGNOSIS — G620 Drug-induced polyneuropathy: Secondary | ICD-10-CM | POA: Diagnosis not present

## 2021-06-27 DIAGNOSIS — Z8719 Personal history of other diseases of the digestive system: Secondary | ICD-10-CM | POA: Insufficient documentation

## 2021-06-27 DIAGNOSIS — C189 Malignant neoplasm of colon, unspecified: Secondary | ICD-10-CM | POA: Insufficient documentation

## 2021-06-27 DIAGNOSIS — K922 Gastrointestinal hemorrhage, unspecified: Secondary | ICD-10-CM | POA: Insufficient documentation

## 2021-06-27 DIAGNOSIS — I110 Hypertensive heart disease with heart failure: Secondary | ICD-10-CM | POA: Insufficient documentation

## 2021-06-27 DIAGNOSIS — I5032 Chronic diastolic (congestive) heart failure: Secondary | ICD-10-CM | POA: Insufficient documentation

## 2021-06-27 DIAGNOSIS — Z95828 Presence of other vascular implants and grafts: Secondary | ICD-10-CM

## 2021-06-27 LAB — CEA (ACCESS): CEA (CHCC): 8.88 ng/mL — ABNORMAL HIGH (ref 0.00–5.00)

## 2021-06-27 MED ORDER — SODIUM CHLORIDE 0.9% FLUSH
10.0000 mL | Freq: Once | INTRAVENOUS | Status: AC
  Administered 2021-06-27: 10 mL via INTRAVENOUS

## 2021-06-27 MED ORDER — HEPARIN SOD (PORK) LOCK FLUSH 100 UNIT/ML IV SOLN
500.0000 [IU] | Freq: Once | INTRAVENOUS | Status: AC
  Administered 2021-06-27: 500 [IU] via INTRAVENOUS

## 2021-06-27 NOTE — Progress Notes (Signed)
[facility] OFFICE PROGRESS NOTE   Diagnosis: Colon cancer  INTERVAL HISTORY:   Jeffrey Young returns as scheduled.  Bowels moving regularly.  He reports seeing Dr. Elnoria Howard recently, colonoscopy planned.  Energy level and appetite vary.  He reports continued tingling and cold sensitivity in the hands.  Objective:  Vital signs in last 24 hours:  Blood pressure (!) 194/82, pulse 82, temperature 98.1 F (36.7 C), temperature source Oral, resp. rate 20, height 6\' 1"  (1.854 m), weight 194 lb 3.2 oz (88.1 kg), SpO2 100 %.    Lymphatics: No palpable cervical, supraclavicular, axillary or inguinal lymph nodes. Resp: Lungs clear bilaterally. Cardio: Regular rate and rhythm. GI: Abdomen soft and nontender.  No hepatosplenomegaly.  No mass. Vascular: No leg edema. Port-A-Cath without erythema.  Lab Results:  Lab Results  Component Value Date   WBC 6.2 04/13/2021   HGB 8.3 (L) 04/13/2021   HCT 25.4 (L) 04/13/2021   MCV 97.3 04/13/2021   PLT 190 04/13/2021   NEUTROABS 12.7 (H) 04/01/2021    Imaging:  No results found.  Medications: I have reviewed the patient's current medications.  Assessment/Plan: Descending colon cancer, stage IIIc (T3N2b M0), status post a partial left colectomy 07/30/2020, 9/16 lymph nodes positive, lymphovascular invasion, 1 satellite nodule, negative margins, MSS, no loss of mismatch repair protein expression -History of large polyp in the left side of the colon-referred to [facility] in 05/2018 for procedure canceled secondary to COVID-19 pandemic.  Procedure was not rescheduled. -CT chest/abdomen/pelvis with contrast 07/27/2020-3 small pulmonary nodules less than 5 mm favored to be benign, circumferential luminal narrowing of the distal transverse colon concerning for malignancy, no metastatic adenopathy in the mesentery porta hepatis, no for metastasis. -CEA on 07/27/2020 was 17.3; 37 on 08/30/2020; 33 on 09/13/2020 -Colonoscopy performed 07/27/2020 showed a  fungating, infiltrative and ulcerated nonobstructing large mass in the proximal descending colon.  Biopsy-adenocarcinoma -Cycle 1 FOLFOX 08/30/2020 -Cycle 2 FOLFOX 09/13/2020, Emend added for delayed nausea -Cycle 3 FOLFOX 09/27/2020 -Cycle 4 FOLFOX 10/11/2020 -Cycle 5 FOLFOX 11/08/2020 -Cycle 6 FOLFOX 11/23/2020 -CT 12/03/2020-prior 3 mm left apical nodule no longer seen, no new/suspicious pulmonary nodules, no evidence of metastatic disease -Cycle 7 FOLFOX 12/06/2020 -Cycle 8 FOLFOX 12/21/2020 -Cycle 9 FOLFOX 01/03/2021 -Cycle 10 FOLFOX 01/17/2021 -Cycle 11 FOLFOX 01/31/2021, oxaliplatin held secondary to neuropathy symptoms -Cycle 12 FOLFOX 02/15/2021, oxaliplatin held secondary to neuropathy -CT abdomen/pelvis 04/02/2021-no evidence of recurrent colon cancer -CT chest 04/08/2021-no evidence of metastatic disease 2.  Anemia due to GI bleeding, iron deficiency?,  Renal insufficiency? 3.  New onset acute diastolic CHF March 2022 4.  Diabetes mellitus 5.  Renal insufficiency 6.  Hypertension 7.  History of left transmetatarsal amputation 8.  History of colon polyps 9.  Neuropathy 10.  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  He remains in clinical remission from colon cancer.  We will follow-up on the CEA from today.  Blood pressure is elevated in the office today.  He did not take his blood pressure medication this morning.  He knows to take when he gets home.  I recommended he follow-up with his PCP regarding the blood pressure regimen as he seems unclear on what he is taking.  He will return for port flush, CEA and follow-up in 6 weeks.  We are available to see him sooner as needed.    Bascom Levels ANP/GNP-BC  06/27/2021  2:26 PM

## 2021-06-28 ENCOUNTER — Telehealth: Payer: Self-pay

## 2021-06-28 NOTE — Telephone Encounter (Signed)
-----   Message from Owens Shark, NP sent at 06/28/2021  2:41 PM EST ----- Please let him know the CEA from yesterday was mildly elevated.  Dr. Benay Spice recommends repeating when we see him in 6 weeks.

## 2021-06-28 NOTE — Telephone Encounter (Signed)
Called and spoke the patient to let him know the CEA  mildly elevated, and repeat CEA in 6 weeks.Patient also ask about his [employer] paperwork, wanted to know if the paperwork was completed. I let the patient know when the paperwork completed I will call him. Patient also stated he have no plan to go back to work. Patient voiced understanding of instruction and had no further questions or concerns at this time.

## 2021-06-29 ENCOUNTER — Encounter (HOSPITAL_COMMUNITY): Payer: Self-pay | Admitting: Emergency Medicine

## 2021-06-29 NOTE — Progress Notes (Signed)
[employer] disability completed and faxed to [phone] (conformation # [id number]) and sent to HIM.

## 2021-06-30 ENCOUNTER — Other Ambulatory Visit: Payer: 59

## 2021-06-30 ENCOUNTER — Ambulatory Visit: Payer: 59 | Admitting: Nurse Practitioner

## 2021-07-04 ENCOUNTER — Other Ambulatory Visit: Payer: Self-pay

## 2021-07-04 ENCOUNTER — Ambulatory Visit (INDEPENDENT_AMBULATORY_CARE_PROVIDER_SITE_OTHER): Payer: 59 | Admitting: Orthopedic Surgery

## 2021-07-04 DIAGNOSIS — Z89432 Acquired absence of left foot: Secondary | ICD-10-CM

## 2021-07-06 ENCOUNTER — Encounter: Payer: Self-pay | Admitting: Orthopedic Surgery

## 2021-07-06 NOTE — Progress Notes (Signed)
Office Visit Note   Patient: Jeffrey Young           Date of Birth: 04-Dec-1965           MRN: [id number] Visit Date: 07/04/2021              Requested by: [name], MD [place] [address],  [place] [phone] PCP: [name], MD  Chief Complaint  Patient presents with   Left Foot - Follow-up      HPI: Patient is a 56 year old gentleman who is seen follow-up for left transmetatarsal amputation that was performed in September 2019.  Patient has worn-out shoes and orthotics and spacer that will need replacing.  Assessment & Plan: Visit Diagnoses:  1. History of transmetatarsal amputation of left foot ([place])     Plan: Patient is given a prescription for biotech for extra-depth shoes custom orthotics with a spacer with a carbon plate.  Follow-Up Instructions: Return if symptoms worsen or fail to improve.   Ortho Exam  Patient is alert, oriented, no adenopathy, well-dressed, normal affect, normal respiratory effort. Examination patient's foot is plantigrade he has dorsiflexion 0 degrees past neutral there are no ulcers no calluses no signs of infection.  Imaging: No results found. No images are attached to the encounter.  Labs: Lab Results  Component Value Date   HGBA1C 11.4 (H) 04/02/2021   HGBA1C 6.7 (H) 07/29/2020   HGBA1C 7.2 (H) 07/25/2020   ESRSEDRATE 121 (H) 01/23/2018   CRP 21.4 (H) 01/23/2018   REPTSTATUS 04/03/2021 FINAL 04/01/2021   GRAMSTAIN  01/22/2018    RARE WBC PRESENT, PREDOMINANTLY PMN MODERATE GRAM POSITIVE COCCI MODERATE GRAM NEGATIVE RODS FEW GRAM POSITIVE RODS Performed at [facility], [place] [address] [place], [place] [address]    CULT  04/01/2021    NO GROWTH Performed at [facility] [address] [place], [place] [address]    LABORGA PROTEUS MIRABILIS 01/22/2018     Lab Results  Component Value Date   ALBUMIN 2.6 (L) 04/03/2021   ALBUMIN 3.4 (L) 04/01/2021   ALBUMIN 3.4 (L) 02/28/2021    PREALBUMIN 6.2 (L) 01/23/2018    Lab Results  Component Value Date   MG 1.8 04/13/2021   MG 1.8 04/12/2021   MG 1.8 04/11/2021   No results found for: [facility]  Lab Results  Component Value Date   PREALBUMIN 6.2 (L) 01/23/2018   CBC EXTENDED Latest Ref Rng & Units 04/13/2021 04/12/2021 04/11/2021  WBC 4.0 - 10.5 K/uL 6.2 6.0 6.6  RBC 4.22 - 5.81 MIL/uL 2.61(L) 2.75(L) 2.73(L)  HGB 13.0 - 17.0 g/dL 8.3(L) 8.8(L) 8.7(L)  HCT 39.0 - 52.0 % 25.4(L) 26.9(L) 26.5(L)  PLT 150 - 400 K/uL 190 209 208  NEUTROABS 1.7 - 7.7 K/uL - - -  LYMPHSABS 0.7 - 4.0 K/uL - - -     There is no height or weight on file to calculate BMI.  Orders:  No orders of the defined types were placed in this encounter.  No orders of the defined types were placed in this encounter.    Procedures: No procedures performed  Clinical Data: No additional findings.  ROS:  All other systems negative, except as noted in the HPI. Review of Systems  Objective: Vital Signs: There were no vitals taken for this visit.  Specialty Comments:  No specialty comments available.  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 07/31/2020   IDA (iron deficiency anemia) from bleeding colon cancer 07/31/2020   Diabetic retinopathy ([place]) 07/31/2020   Hyperlipidemia 07/31/2020   Low back pain 07/31/2020   Shortness of breath 93/71/6967   Systolic heart failure ([place]) 07/31/2020   CKD (chronic kidney disease) stage 3, GFR 30-59 ml/min ([place]) 07/31/2020   Insulin-requiring or dependent type II diabetes mellitus ([place]) 07/31/2020   ARF (acute renal  failure) ([place]) 07/25/2020   Symptomatic anemia    New onset of congestive heart failure ([place]) 07/24/2020   GIB (gastrointestinal bleeding) 07/24/2020   Hypertension associated with diabetes ([place]) 06/07/2019   Erectile dysfunction associated with type 2 diabetes mellitus ([place]) 05/08/2019   Sepsis ([place]) 01/22/2018   Diabetic ulcer of left foot ([place]) 01/22/2018   Status post transmetatarsal amputation of left foot ([place]) 01/22/2018   Diabetic mononeuropathy associated with type 2 diabetes mellitus ([place]) 03/21/2017   Microalbuminuria due to type 2 diabetes mellitus ([place]) 03/21/2017   Vitamin D deficiency 04/06/2009   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 Type 2 Diabetes Mellitus - Uncomplicated, Uncontrolled     Family History  Problem Relation Age of Onset   Hypertension Father     Past Surgical History:  Procedure Laterality Date   AMPUTATION Left 01/22/2018   Procedure: TRANSMETATARSAL AMPUTATION;  Surgeon: Newt Minion, MD;  Location: [facility];  Service: Orthopedics;  Laterality: Left;toes   BIOPSY  07/27/2020   Procedure: BIOPSY;  Surgeon: Carol Ada, MD;  Location: [facility];  Service: Endoscopy;;   COLON RESECTION N/A 07/30/2020   Procedure: HAND ASSISTED LAPAROSCOPIC LEFT HEMI COLECTOMY;  Surgeon: Johnathan Hausen, MD;  Location: [facility];  Service: General;  Laterality: N/A;   COLONOSCOPY WITH PROPOFOL N/A 05/24/2018   Procedure: COLONOSCOPY WITH PROPOFOL;  Surgeon: Carol Ada, MD;  Location: [facility];  Service: Endoscopy;  Laterality: N/A;   COLONOSCOPY WITH PROPOFOL N/A 07/27/2020   Procedure: COLONOSCOPY WITH PROPOFOL;  Surgeon: Carol Ada, MD;  [facility];  Service: Endoscopy;  Laterality: N/A;  ESOPHAGOGASTRODUODENOSCOPY Left 04/05/2021   Procedure: ESOPHAGOGASTRODUODENOSCOPY (EGD);  Surgeon: Carol Ada, MD;  Location[facility];  Service: Endoscopy;  Laterality: Left;   POLYPECTOMY  05/24/2018   Procedure: POLYPECTOMY;  Surgeon: Carol Ada, MD;  [facility]: [facility];  Service: Endoscopy;;   PORTACATH PLACEMENT Left 08/24/2020   Procedure: INSERTION PORT-A-CATH;  Surgeon: Johnathan Hausen, MD;  Location: [facility];  Service: General;  Laterality: Left;  75/rm1   SUBMUCOSAL TATTOO INJECTION  07/27/2020   Procedure: SUBMUCOSAL TATTOO INJECTION;  Surgeon: Carol Ada, MD;  [facility];  Service: Endoscopy;;   Social History   Occupational History   Not on file  Tobacco Use   Smoking status: Never   Smokeless tobacco: Never  Vaping Use   Vaping Use: Never used  Substance and Sexual Activity   Alcohol use: Yes    Alcohol/week: 1.0 standard drink    Types: 1 Cans of beer per week    Comment: occasional   Drug use: Never   Sexual activity: Yes

## 2021-08-08 ENCOUNTER — Inpatient Hospital Stay (HOSPITAL_BASED_OUTPATIENT_CLINIC_OR_DEPARTMENT_OTHER): Payer: 59 | Admitting: Oncology

## 2021-08-08 ENCOUNTER — Other Ambulatory Visit: Payer: Self-pay

## 2021-08-08 ENCOUNTER — Inpatient Hospital Stay: Payer: 59 | Attending: Oncology

## 2021-08-08 ENCOUNTER — Inpatient Hospital Stay: Payer: 59

## 2021-08-08 VITALS — BP 130/81 | HR 81 | Temp 98.7°F | Resp 20 | Ht 73.0 in | Wt 203.2 lb

## 2021-08-08 DIAGNOSIS — Z9049 Acquired absence of other specified parts of digestive tract: Secondary | ICD-10-CM | POA: Insufficient documentation

## 2021-08-08 DIAGNOSIS — Z95828 Presence of other vascular implants and grafts: Secondary | ICD-10-CM

## 2021-08-08 DIAGNOSIS — E119 Type 2 diabetes mellitus without complications: Secondary | ICD-10-CM | POA: Diagnosis not present

## 2021-08-08 DIAGNOSIS — Z79899 Other long term (current) drug therapy: Secondary | ICD-10-CM | POA: Diagnosis not present

## 2021-08-08 DIAGNOSIS — I110 Hypertensive heart disease with heart failure: Secondary | ICD-10-CM | POA: Insufficient documentation

## 2021-08-08 DIAGNOSIS — N289 Disorder of kidney and ureter, unspecified: Secondary | ICD-10-CM | POA: Diagnosis not present

## 2021-08-08 DIAGNOSIS — K922 Gastrointestinal hemorrhage, unspecified: Secondary | ICD-10-CM | POA: Diagnosis not present

## 2021-08-08 DIAGNOSIS — R110 Nausea: Secondary | ICD-10-CM | POA: Insufficient documentation

## 2021-08-08 DIAGNOSIS — T451X5D Adverse effect of antineoplastic and immunosuppressive drugs, subsequent encounter: Secondary | ICD-10-CM | POA: Insufficient documentation

## 2021-08-08 DIAGNOSIS — C189 Malignant neoplasm of colon, unspecified: Secondary | ICD-10-CM | POA: Diagnosis present

## 2021-08-08 DIAGNOSIS — I5032 Chronic diastolic (congestive) heart failure: Secondary | ICD-10-CM | POA: Diagnosis not present

## 2021-08-08 DIAGNOSIS — Z8719 Personal history of other diseases of the digestive system: Secondary | ICD-10-CM | POA: Insufficient documentation

## 2021-08-08 DIAGNOSIS — D649 Anemia, unspecified: Secondary | ICD-10-CM | POA: Diagnosis not present

## 2021-08-08 DIAGNOSIS — G620 Drug-induced polyneuropathy: Secondary | ICD-10-CM | POA: Insufficient documentation

## 2021-08-08 DIAGNOSIS — C185 Malignant neoplasm of splenic flexure: Secondary | ICD-10-CM

## 2021-08-08 LAB — CEA (ACCESS): CEA (CHCC): 27.82 ng/mL — ABNORMAL HIGH (ref 0.00–5.00)

## 2021-08-08 MED ORDER — SODIUM CHLORIDE 0.9% FLUSH
10.0000 mL | Freq: Once | INTRAVENOUS | Status: AC
  Administered 2021-08-08: 10 mL via INTRAVENOUS

## 2021-08-08 MED ORDER — HEPARIN SOD (PORK) LOCK FLUSH 100 UNIT/ML IV SOLN
500.0000 [IU] | Freq: Once | INTRAVENOUS | Status: AC
  Administered 2021-08-08: 500 [IU] via INTRAVENOUS

## 2021-08-08 NOTE — Progress Notes (Signed)
?[place] ?OFFICE PROGRESS NOTE ? ? ?Diagnosis: Colon cancer ? ?INTERVAL HISTORY:  ? ?Jeffrey Young returns as scheduled.  He is here with his wife.  He continues to have peripheral neuropathy symptoms and difficulty with balance.  Good appetite. ? ?Blood pressure 130/81, pulse 81, temperature 98.7 ?F (37.1 ?C), temperature source Oral, resp. rate 20, height $RemoveBe'6\' 1"'FLZxGOMxV$  (1.854 m), weight 203 lb 3.2 oz (92.2 kg), SpO2 100 %. ?  ? ?Lymphatics: No cervical, supraclavicular, axillary, or inguinal nodes ?Resp: Lungs clear bilaterally ?Cardio: Regular rate and rhythm ?GI: No hepatosplenomegaly, nontender, no mass ?Vascular: No leg edema ?  ? ?Portacath/PICC-without erythema ? ?Lab Results: ? ?Lab Results  ?Component Value Date  ? WBC 6.2 04/13/2021  ? HGB 8.3 (L) 04/13/2021  ? HCT 25.4 (L) 04/13/2021  ? MCV 97.3 04/13/2021  ? PLT 190 04/13/2021  ? NEUTROABS 12.7 (H) 04/01/2021  ? ? ?CMP  ?Lab Results  ?Component Value Date  ? NA 134 (L) 04/13/2021  ? K 3.6 04/13/2021  ? CL 101 04/13/2021  ? CO2 29 04/13/2021  ? GLUCOSE 171 (H) 04/13/2021  ? BUN 14 04/13/2021  ? CREATININE 1.56 (H) 04/13/2021  ? CALCIUM 8.4 (L) 04/13/2021  ? PROT 6.2 (L) 04/03/2021  ? ALBUMIN 2.6 (L) 04/03/2021  ? AST 17 04/03/2021  ? ALT 9 04/03/2021  ? ALKPHOS 113 04/03/2021  ? BILITOT 1.9 (H) 04/03/2021  ? GFRNONAA 51 (L) 04/13/2021  ? GFRAA >60 01/26/2018  ? ? ?Lab Results  ?Component Value Date  ? CEA1 11.44 (H) 09/27/2020  ? CEA 27.82 (H) 08/08/2021  ? ? ?Medications: I have reviewed the patient's current medications. ? ? ?Assessment/Plan: ? ?Descending colon cancer, stage IIIc (T3N2b M0), status post a partial left colectomy 07/30/2020, 9/16 lymph nodes positive, lymphovascular invasion, 1 satellite nodule, negative margins, MSS, no loss of mismatch repair protein expression ?-History of large polyp in the left side of the colon-referred to [facility] in 05/2018 for procedure canceled secondary to COVID-19 pandemic.  Procedure was not  rescheduled. ?-CT chest/abdomen/pelvis with contrast 07/27/2020-3 small pulmonary nodules less than 5 mm favored to be benign, circumferential luminal narrowing of the distal transverse colon concerning for malignancy, no metastatic adenopathy in the mesentery porta hepatis, no for metastasis. ?-CEA on 07/27/2020 was 17.3; 37 on 08/30/2020; 33 on 09/13/2020 ?-Colonoscopy performed 07/27/2020 showed a fungating, infiltrative and ulcerated nonobstructing large mass in the proximal descending colon.  Biopsy-adenocarcinoma ?-Cycle 1 FOLFOX 08/30/2020 ?-Cycle 2 FOLFOX 09/13/2020, Emend added for delayed nausea ?-Cycle 3 FOLFOX 09/27/2020 ?-Cycle 4 FOLFOX 10/11/2020 ?-Cycle 5 FOLFOX 11/08/2020 ?-Cycle 6 FOLFOX 11/23/2020 ?-CT 12/03/2020-prior 3 mm left apical nodule no longer seen, no new/suspicious pulmonary nodules, no evidence of metastatic disease ?-Cycle 7 FOLFOX 12/06/2020 ?-Cycle 8 FOLFOX 12/21/2020 ?-Cycle 9 FOLFOX 01/03/2021 ?-Cycle 10 FOLFOX 01/17/2021 ?-Cycle 11 FOLFOX 01/31/2021, oxaliplatin held secondary to neuropathy symptoms ?-Cycle 12 FOLFOX 02/15/2021, oxaliplatin held secondary to neuropathy ?-CT abdomen/pelvis 04/02/2021-no evidence of recurrent colon cancer ?-CT chest 04/08/2021-no evidence of metastatic disease ?-Elevated CEA February and March 2023 ?2.  Anemia due to GI bleeding, iron deficiency?,  Renal insufficiency? ?3.  New onset acute diastolic CHF March 7096 ?4.  Diabetes mellitus ?5.  Renal insufficiency ?6.  Hypertension ?7.  History of left transmetatarsal amputation ?8.  History of colon polyps ?9.  Neuropathy ?10.  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  ? ? ?Disposition: ?Jeffrey Young appears stable.  He has persistent oxaliplatin neuropathy symptoms.  The CEA was higher on 06/27/2021 and again today.  This may indicate progression of the colon cancer.  We discussed the differential diagnosis.  He will be  scheduled for a staging PET scan.  He will return for an office visit on 08/19/2021. ? ?Betsy Coder, MD ? ?08/08/2021  ?3:46 PM ? ? ?

## 2021-08-17 ENCOUNTER — Ambulatory Visit (HOSPITAL_COMMUNITY)
Admission: RE | Admit: 2021-08-17 | Discharge: 2021-08-17 | Disposition: A | Discharge status: Home / Self Care | Payer: 59 | Source: Ambulatory Visit | Attending: Oncology | Admitting: Oncology

## 2021-08-17 ENCOUNTER — Other Ambulatory Visit: Payer: Self-pay

## 2021-08-17 DIAGNOSIS — C185 Malignant neoplasm of splenic flexure: Secondary | ICD-10-CM | POA: Insufficient documentation

## 2021-08-17 LAB — GLUCOSE, CAPILLARY: Glucose-Capillary: 351 mg/dL — ABNORMAL HIGH (ref 70–99)

## 2021-08-17 MED ORDER — FLUDEOXYGLUCOSE F - 18 (FDG) INJECTION
10.2000 | Freq: Once | INTRAVENOUS | Status: AC
  Administered 2021-08-17: 9.99 via INTRAVENOUS

## 2021-08-19 ENCOUNTER — Inpatient Hospital Stay (HOSPITAL_BASED_OUTPATIENT_CLINIC_OR_DEPARTMENT_OTHER): Payer: 59 | Admitting: Nurse Practitioner

## 2021-08-19 ENCOUNTER — Encounter: Payer: Self-pay | Admitting: Nurse Practitioner

## 2021-08-19 VITALS — BP 131/80 | HR 72 | Temp 97.9°F | Resp 18 | Wt 197.7 lb

## 2021-08-19 DIAGNOSIS — C185 Malignant neoplasm of splenic flexure: Secondary | ICD-10-CM | POA: Diagnosis not present

## 2021-08-19 DIAGNOSIS — C189 Malignant neoplasm of colon, unspecified: Secondary | ICD-10-CM | POA: Diagnosis not present

## 2021-08-19 NOTE — Progress Notes (Signed)
?Jeffrey Young ?OFFICE PROGRESS NOTE ? ? ?Diagnosis: Colon cancer ? ?INTERVAL HISTORY:  ? ?Jeffrey Young returns as scheduled.  He reports having "good days and bad days".  Appetite and energy vary.  He denies pain.  He continues to have numbness/tingling in the hands.  He has difficulty buttoning his clothes, tying his shoes and has poor grip strength.  He drops items periodically.  He has intermittent numbness/tingling in the feet. ? ?Objective: ? ?Vital signs in last 24 hours: ? ?Blood pressure 131/80, pulse 72, temperature 97.9 ?F (36.6 ?C), temperature source Tympanic, resp. rate 18, weight 197 lb 11.2 oz (89.7 kg), SpO2 100 %. ?  ? ?Lymphatics: No palpable cervical, supraclavicular, axillary or inguinal lymph nodes. ?Resp: Lungs clear bilaterally. ?Cardio: Regular rate and rhythm. ?GI: Abdomen soft and nontender.  No hepatomegaly.  No mass. ?Vascular: No leg edema. ?Port-A-Cath without erythema. ? ?Lab Results: ? ?Lab Results  ?Component Value Date  ? WBC 6.2 04/13/2021  ? HGB 8.3 (L) 04/13/2021  ? HCT 25.4 (L) 04/13/2021  ? MCV 97.3 04/13/2021  ? PLT 190 04/13/2021  ? NEUTROABS 12.7 (H) 04/01/2021  ? ? ?Imaging: ? ?No results found. ? ?Medications: I have reviewed the patient's current medications. ? ?Assessment/Plan: ?Descending colon cancer, stage IIIc (T3N2b M0), status post a partial left colectomy 07/30/2020, 9/16 lymph nodes positive, lymphovascular invasion, 1 satellite nodule, negative margins, MSS, no loss of mismatch repair protein expression ?-History of large polyp in the left side of the colon-referred to [facility] in 05/2018 for procedure canceled secondary to COVID-19 pandemic.  Procedure was not rescheduled. ?-CT chest/abdomen/pelvis with contrast 07/27/2020-3 small pulmonary nodules less than 5 mm favored to be benign, circumferential luminal narrowing of the distal transverse colon concerning for malignancy, no metastatic adenopathy in the mesentery porta hepatis, no for metastasis. ?-CEA  on 07/27/2020 was 17.3; 37 on 08/30/2020; 33 on 09/13/2020 ?-Colonoscopy performed 07/27/2020 showed a fungating, infiltrative and ulcerated nonobstructing large mass in the proximal descending colon.  Biopsy-adenocarcinoma ?-Cycle 1 FOLFOX 08/30/2020 ?-Cycle 2 FOLFOX 09/13/2020, Emend added for delayed nausea ?-Cycle 3 FOLFOX 09/27/2020 ?-Cycle 4 FOLFOX 10/11/2020 ?-Cycle 5 FOLFOX 11/08/2020 ?-Cycle 6 FOLFOX 11/23/2020 ?-CT 12/03/2020-prior 3 mm left apical nodule no longer seen, no new/suspicious pulmonary nodules, no evidence of metastatic disease ?-Cycle 7 FOLFOX 12/06/2020 ?-Cycle 8 FOLFOX 12/21/2020 ?-Cycle 9 FOLFOX 01/03/2021 ?-Cycle 10 FOLFOX 01/17/2021 ?-Cycle 11 FOLFOX 01/31/2021, oxaliplatin held secondary to neuropathy symptoms ?-Cycle 12 FOLFOX 02/15/2021, oxaliplatin held secondary to neuropathy ?-CT abdomen/pelvis 04/02/2021-no evidence of recurrent colon cancer ?-CT chest 04/08/2021-no evidence of metastatic disease ?-Elevated CEA February and March 2023 ?-08/17/2021 PET scan-no evidence of local recurrence or metastasis ?2.  Anemia due to GI bleeding, iron deficiency?,  Renal insufficiency? ?3.  New onset acute diastolic CHF March 9242 ?4.  Diabetes mellitus ?5.  Renal insufficiency ?6.  Hypertension ?7.  History of left transmetatarsal amputation ?8.  History of colon polyps ?9.  Neuropathy ?10.  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 ?Disposition: Jeffrey Young appears stable.  The CEA was higher earlier this month.  He had a PET scan 2 days ago that showed no evidence of cancer.  We discussed obtaining a repeat CEA level in 4 weeks.  If still elevated plan at that point is to obtain testing for circulating tumor DNA.  Jeffrey Young are in agreement. ? ?He will return for port flush, CEA and follow-up  in approximately 4 weeks.  He will contact the office in the interim with any problems. ? ?Plan reviewed with Dr.  Benay Spice. ? ?Jeffrey Young ANP/GNP-BC  ? ?08/19/2021  ?2:16 PM ? ? ? ? ? ? ? ?

## 2021-08-23 ENCOUNTER — Encounter (HOSPITAL_COMMUNITY): Payer: Self-pay | Admitting: Emergency Medicine

## 2021-08-23 NOTE — Progress Notes (Signed)
[employer] functional healthcare information questionaire completed and faxed to [phone] ([employer] [phone]).  Sen to be scanned in chart. ?

## 2021-09-12 ENCOUNTER — Inpatient Hospital Stay: Payer: 59 | Attending: Oncology

## 2021-09-12 ENCOUNTER — Inpatient Hospital Stay: Payer: 59

## 2021-09-12 ENCOUNTER — Inpatient Hospital Stay (HOSPITAL_BASED_OUTPATIENT_CLINIC_OR_DEPARTMENT_OTHER): Payer: 59 | Admitting: Oncology

## 2021-09-12 VITALS — BP 150/90 | HR 74 | Temp 98.2°F | Resp 18 | Ht 73.0 in | Wt 202.8 lb

## 2021-09-12 DIAGNOSIS — C186 Malignant neoplasm of descending colon: Secondary | ICD-10-CM | POA: Diagnosis present

## 2021-09-12 DIAGNOSIS — C185 Malignant neoplasm of splenic flexure: Secondary | ICD-10-CM

## 2021-09-12 LAB — CEA (ACCESS): CEA (CHCC): 34.56 ng/mL — ABNORMAL HIGH (ref 0.00–5.00)

## 2021-09-12 NOTE — Progress Notes (Signed)
TC to Pt V/M message left to return call to office. ?

## 2021-09-12 NOTE — Progress Notes (Signed)
?Jeffrey Young ?OFFICE PROGRESS NOTE ? ? ?Diagnosis: Colon cancer ? ?INTERVAL HISTORY:  ? ?Jeffrey Young returns as scheduled.  Good appetite.  No new complaint. ? ?Objective: ? ?Vital signs in last 24 hours: ? ?Blood pressure (!) 150/90, pulse 74, temperature 98.2 ?F (36.8 ?C), temperature source Oral, resp. rate 18, height $RemoveBe'6\' 1"'eEHsQjqyS$  (1.854 m), weight 202 lb 12.8 oz (92 kg), SpO2 100 %. ?  ? ?HEENT: Neck without mass ?Lymphatics: No cervical, supraclavicular, axillary, or inguinal nodes ?Resp: Lungs clear bilaterally ?Cardio: Regular rate and rhythm ?GI: No mass, no hepatosplenomegaly, nontender ?Vascular: No leg edema ? ? ?Portacath/PICC-without erythema ? ?Lab Results: ? ?Lab Results  ?Component Value Date  ? WBC 6.2 04/13/2021  ? HGB 8.3 (L) 04/13/2021  ? HCT 25.4 (L) 04/13/2021  ? MCV 97.3 04/13/2021  ? PLT 190 04/13/2021  ? NEUTROABS 12.7 (H) 04/01/2021  ? ? ?CMP  ?Lab Results  ?Component Value Date  ? NA 134 (L) 04/13/2021  ? K 3.6 04/13/2021  ? CL 101 04/13/2021  ? CO2 29 04/13/2021  ? GLUCOSE 171 (H) 04/13/2021  ? BUN 14 04/13/2021  ? CREATININE 1.56 (H) 04/13/2021  ? CALCIUM 8.4 (L) 04/13/2021  ? PROT 6.2 (L) 04/03/2021  ? ALBUMIN 2.6 (L) 04/03/2021  ? AST 17 04/03/2021  ? ALT 9 04/03/2021  ? ALKPHOS 113 04/03/2021  ? BILITOT 1.9 (H) 04/03/2021  ? GFRNONAA 51 (L) 04/13/2021  ? GFRAA >60 01/26/2018  ? ? ?Lab Results  ?Component Value Date  ? CEA1 11.44 (H) 09/27/2020  ? CEA 27.82 (H) 08/08/2021  ? ? ?Medications: I have reviewed the patient's current medications. ? ? ?Assessment/Plan: ?Descending colon cancer, stage IIIc (T3N2b M0), status post a partial left colectomy 07/30/2020, 9/16 lymph nodes positive, lymphovascular invasion, 1 satellite nodule, negative margins, MSS, no loss of mismatch repair protein expression ?-History of large polyp in the left side of the colon-referred to [facility] in 05/2018 for procedure canceled secondary to COVID-19 pandemic.  Procedure was not rescheduled. ?-CT  chest/abdomen/pelvis with contrast 07/27/2020-3 small pulmonary nodules less than 5 mm favored to be benign, circumferential luminal narrowing of the distal transverse colon concerning for malignancy, no metastatic adenopathy in the mesentery porta hepatis, no for metastasis. ?-CEA on 07/27/2020 was 17.3; 37 on 08/30/2020; 33 on 09/13/2020 ?-Colonoscopy performed 07/27/2020 showed a fungating, infiltrative and ulcerated nonobstructing large mass in the proximal descending colon.  Biopsy-adenocarcinoma ?-Cycle 1 FOLFOX 08/30/2020 ?-Cycle 2 FOLFOX 09/13/2020, Emend added for delayed nausea ?-Cycle 3 FOLFOX 09/27/2020 ?-Cycle 4 FOLFOX 10/11/2020 ?-Cycle 5 FOLFOX 11/08/2020 ?-Cycle 6 FOLFOX 11/23/2020 ?-CT 12/03/2020-prior 3 mm left apical nodule no longer seen, no new/suspicious pulmonary nodules, no evidence of metastatic disease ?-Cycle 7 FOLFOX 12/06/2020 ?-Cycle 8 FOLFOX 12/21/2020 ?-Cycle 9 FOLFOX 01/03/2021 ?-Cycle 10 FOLFOX 01/17/2021 ?-Cycle 11 FOLFOX 01/31/2021, oxaliplatin held secondary to neuropathy symptoms ?-Cycle 12 FOLFOX 02/15/2021, oxaliplatin held secondary to neuropathy ?-CT abdomen/pelvis 04/02/2021-no evidence of recurrent colon cancer ?-CT chest 04/08/2021-no evidence of metastatic disease ?-Elevated CEA February and March 2023 ?-08/17/2021 PET scan-no evidence of local recurrence or metastasis ?2.  Anemia due to GI bleeding, iron deficiency?,  Renal insufficiency? ?3.  New onset acute diastolic CHF March 0300 ?4.  Diabetes mellitus ?5.  Renal insufficiency ?6.  Hypertension ?7.  History of left transmetatarsal amputation ?8.  History of colon polyps ?9.  Neuropathy ?10.  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 ? ?Disposition: ?  Jeffrey Young appears stable.  There is no clinical evidence for progression of colon cancer.  We will follow-up on the CEA from today.  He will be referred for a circulating DNA tumor DNA study if  the CEA is significantly higher. ? ?Jeffrey Young will be scheduled for an office visit and Port-A-Cath flush in 6 weeks. ? ?Betsy Coder, MD ? ?09/12/2021  ?9:57 AM ? ? ?

## 2021-09-14 ENCOUNTER — Encounter: Payer: Self-pay | Admitting: Oncology

## 2021-09-15 ENCOUNTER — Telehealth: Payer: Self-pay

## 2021-09-15 NOTE — Telephone Encounter (Signed)
Pt was left a message to return call in reference to lab results several calls were made to Pt. My chart message from Pt requesting information on results. Information given in my chart message. Information sent to schedule Pt for lab and visit with Dr Benay Spice. ?

## 2021-09-15 NOTE — Telephone Encounter (Signed)
-----   Message from Ladell Pier, MD sent at 09/12/2021  1:32 PM EDT ----- ?Please call patient, CEA is higher again today, schedule him to come in for a guardant reveal blood test within the next 2 weeks, schedule an office visit approximately 10 days after the guardant reveal test ? ?

## 2021-09-21 ENCOUNTER — Other Ambulatory Visit: Payer: Self-pay | Admitting: *Deleted

## 2021-09-21 DIAGNOSIS — C185 Malignant neoplasm of splenic flexure: Secondary | ICD-10-CM

## 2021-09-26 ENCOUNTER — Inpatient Hospital Stay: Payer: 59

## 2021-09-26 ENCOUNTER — Inpatient Hospital Stay: Payer: 59 | Attending: Oncology

## 2021-09-26 DIAGNOSIS — N289 Disorder of kidney and ureter, unspecified: Secondary | ICD-10-CM | POA: Diagnosis not present

## 2021-09-26 DIAGNOSIS — Z85038 Personal history of other malignant neoplasm of large intestine: Secondary | ICD-10-CM | POA: Insufficient documentation

## 2021-09-26 DIAGNOSIS — I110 Hypertensive heart disease with heart failure: Secondary | ICD-10-CM | POA: Insufficient documentation

## 2021-09-26 DIAGNOSIS — Z9049 Acquired absence of other specified parts of digestive tract: Secondary | ICD-10-CM | POA: Diagnosis not present

## 2021-09-26 DIAGNOSIS — I5032 Chronic diastolic (congestive) heart failure: Secondary | ICD-10-CM | POA: Diagnosis not present

## 2021-09-26 DIAGNOSIS — C185 Malignant neoplasm of splenic flexure: Secondary | ICD-10-CM

## 2021-09-26 DIAGNOSIS — E119 Type 2 diabetes mellitus without complications: Secondary | ICD-10-CM | POA: Diagnosis not present

## 2021-09-26 DIAGNOSIS — Z452 Encounter for adjustment and management of vascular access device: Secondary | ICD-10-CM | POA: Diagnosis not present

## 2021-09-26 DIAGNOSIS — C189 Malignant neoplasm of colon, unspecified: Secondary | ICD-10-CM

## 2021-09-26 MED ORDER — SODIUM CHLORIDE 0.9% FLUSH
10.0000 mL | INTRAVENOUS | Status: DC | PRN
  Administered 2021-09-26: 10 mL

## 2021-09-26 MED ORDER — HEPARIN SOD (PORK) LOCK FLUSH 100 UNIT/ML IV SOLN
500.0000 [IU] | Freq: Once | INTRAVENOUS | Status: AC | PRN
  Administered 2021-09-26: 500 [IU]

## 2021-10-06 ENCOUNTER — Encounter: Payer: Self-pay | Admitting: Oncology

## 2021-10-10 ENCOUNTER — Ambulatory Visit (INDEPENDENT_AMBULATORY_CARE_PROVIDER_SITE_OTHER): Payer: 59 | Admitting: Orthopedic Surgery

## 2021-10-10 ENCOUNTER — Encounter: Payer: Self-pay | Admitting: Orthopedic Surgery

## 2021-10-10 DIAGNOSIS — L97521 Non-pressure chronic ulcer of other part of left foot limited to breakdown of skin: Secondary | ICD-10-CM | POA: Diagnosis not present

## 2021-10-10 DIAGNOSIS — Z89432 Acquired absence of left foot: Secondary | ICD-10-CM

## 2021-10-10 LAB — GUARDANT 360

## 2021-10-10 NOTE — Progress Notes (Signed)
Office Visit Note   Patient: Jeffrey Young           Date of Birth: March 02, 1966           MRN: [id number] Visit Date: 10/10/2021              Requested by: [name], MD [address] [place],  [place] [id number] PCP: [name], MD  Chief Complaint  Patient presents with   Left Foot - Open Wound      HPI: Patient is a 56 year old gentleman who presents status post left transmetatarsal amputation with a new plantar ulcer.  Patient states he was trying to wear his air Jordans when he developed the ulcer.  Assessment & Plan: Visit Diagnoses:  1. History of transmetatarsal amputation of left foot ([place])   2. Non-pressure chronic ulcer of other part of left foot limited to breakdown of skin ([place])     Plan: We will have him resume his carbon plate custom orthotics spacer and extra-depth shoe.  Patient was also given a new prescription from the [place] to provide him a new shoe carbon plate and orthotic.  Follow-Up Instructions: Return in about 2 weeks (around 10/24/2021).   Ortho Exam  Patient is alert, oriented, no adenopathy, well-dressed, normal affect, normal respiratory effort. Examination patient has a new Wagner grade 1 ulcer on the plantar aspect of the left foot there is no ascending cellulitis no odor no drainage.  After informed consent a 10 blade knife was used to debride the skin and soft tissue back to healthy viable granulation tissue prior to debridement the ulcer was 1 cm diameter after debridement the ulcer is 3 cm in diameter and 1 mm deep with healthy granulation tissue no exposed bone or tendon.  Imaging: No results found. No images are attached to the encounter.  Labs: Lab Results  Component Value Date   HGBA1C 11.4 (H) 04/02/2021   HGBA1C 6.7 (H) 07/29/2020   HGBA1C 7.2 (H) 07/25/2020   ESRSEDRATE 121 (H) 01/23/2018   CRP 21.4 (H) 01/23/2018   REPTSTATUS 04/03/2021 FINAL 04/01/2021   GRAMSTAIN  01/22/2018    RARE WBC PRESENT,  PREDOMINANTLY PMN MODERATE GRAM POSITIVE COCCI MODERATE GRAM NEGATIVE RODS FEW GRAM POSITIVE RODS Performed at [facility], [place] [address] [place], [place] [address]    CULT  04/01/2021    NO GROWTH Performed at [facility] [address], [place],  [address]    LABORGA PROTEUS MIRABILIS 01/22/2018     Lab Results  Component Value Date   ALBUMIN 2.6 (L) 04/03/2021   ALBUMIN 3.4 (L) 04/01/2021   ALBUMIN 3.4 (L) 02/28/2021   PREALBUMIN 6.2 (L) 01/23/2018    Lab Results  Component Value Date   MG 1.8 04/13/2021   MG 1.8 04/12/2021   MG 1.8 04/11/2021   No results found for: [facility]  Lab Results  Component Value Date   PREALBUMIN 6.2 (L) 01/23/2018      Latest Ref Rng & Units 04/13/2021    3:45 AM 04/12/2021    4:25 AM 04/11/2021    3:00 AM  CBC EXTENDED  WBC 4.0 - 10.5 K/uL 6.2   6.0   6.6    RBC 4.22 - 5.81 MIL/uL 2.61   2.75   2.73    Hemoglobin 13.0 - 17.0 g/dL 8.3   8.8   8.7    HCT 39.0 - 52.0 % 25.4   26.9   26.5  Platelets 150 - 400 K/uL 190   209   208       There is no height or weight on file to calculate BMI.  Orders:  No orders of the defined types were placed in this encounter.  No orders of the defined types were placed in this encounter.    Procedures: No procedures performed  Clinical Data: No additional findings.  ROS:  All other systems negative, except as noted in the HPI. Review of Systems  Objective: Vital Signs: There were no vitals taken for this visit.  Specialty Comments:  No specialty comments available.  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 07/30/2020 07/31/2020   IDA (iron deficiency anemia) from bleeding colon cancer 07/31/2020   Diabetic retinopathy ([place]) 07/31/2020   Hyperlipidemia 07/31/2020   Low back pain 07/31/2020   Shortness of breath 33/00/7622   Systolic heart failure ([place]) 07/31/2020   CKD (chronic kidney disease) stage 3, GFR 30-59 ml/min ([place]) 07/31/2020   Insulin-requiring or dependent type II diabetes mellitus ([place]) 07/31/2020   ARF (acute renal failure) ([place]) 07/25/2020   Symptomatic anemia    New onset of congestive heart failure ([place]) 07/24/2020   GIB (gastrointestinal bleeding) 07/24/2020   Hypertension associated with diabetes ([place]) 06/07/2019   Erectile dysfunction associated with type 2 diabetes mellitus ([place]) 05/08/2019   Sepsis ([place]) 01/22/2018   Diabetic ulcer of left foot ([place]) 01/22/2018   Status post transmetatarsal amputation of left foot ([place]) 01/22/2018   Diabetic mononeuropathy associated with type 2 diabetes mellitus ([place]) 03/21/2017   Microalbuminuria due to type 2 diabetes mellitus ([place]) 03/21/2017   Vitamin D deficiency 04/06/2009   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 Type 2 Diabetes Mellitus - Uncomplicated, Uncontrolled    Family History  Problem Relation Age of Onset   Hypertension Father     Past Surgical History:  Procedure Laterality Date   AMPUTATION Left 01/22/2018   Procedure: TRANSMETATARSAL  AMPUTATION;  Surgeon: Newt Minion, MD;  Location: [facility];  Service: Orthopedics;  Laterality: Left;toes   BIOPSY  07/27/2020   Procedure: BIOPSY;  Surgeon: Carol Ada, MD;  Location: [facility];  Service: Endoscopy;;   COLON RESECTION N/A 07/30/2020   Procedure: HAND ASSISTED LAPAROSCOPIC LEFT HEMI COLECTOMY;  Surgeon: Johnathan Hausen, MD;  Location: [facility];  Service: General;  Laterality: N/A;   COLONOSCOPY WITH PROPOFOL N/A 05/24/2018   Procedure: COLONOSCOPY WITH PROPOFOL;  Surgeon: Carol Ada, MD;  [facility];  Service: Endoscopy;  Laterality: N/A;   COLONOSCOPY WITH PROPOFOL N/A 07/27/2020   Procedure: COLONOSCOPY WITH PROPOFOL;  Surgeon: Carol Ada, MD;  [facility];  Service: Endoscopy;  Laterality: N/A;   ESOPHAGOGASTRODUODENOSCOPY Left 04/05/2021   Procedure: ESOPHAGOGASTRODUODENOSCOPY (EGD);  Surgeon: Carol Ada, MD;  Location[facility];  Service: Endoscopy;  Laterality: Left;   POLYPECTOMY  05/24/2018   Procedure: POLYPECTOMY;  Surgeon: Carol Ada, MD;  [facility];  Service: Endoscopy;;   PORTACATH PLACEMENT Left 08/24/2020   Procedure: INSERTION PORT-A-CATH;  Surgeon: Johnathan Hausen, MD;  Location: [facility];  Service: General;  Laterality: Left;  75/rm1   SUBMUCOSAL TATTOO INJECTION  07/27/2020   Procedure:  SUBMUCOSAL TATTOO INJECTION;  Surgeon: Carol Ada, MD;  [facility];  Service: Endoscopy;;   Social History   Occupational History   Not on file  Tobacco Use   Smoking status: Never   Smokeless tobacco: Never  Vaping Use   Vaping Use: Never used  Substance and Sexual Activity   Alcohol use: Yes    Alcohol/week: 1.0 standard drink    Types: 1 Cans of beer per week    Comment: occasional   Drug use: Never   Sexual activity: Yes

## 2021-10-11 ENCOUNTER — Inpatient Hospital Stay (HOSPITAL_BASED_OUTPATIENT_CLINIC_OR_DEPARTMENT_OTHER): Payer: 59 | Admitting: Oncology

## 2021-10-11 VITALS — BP 147/82 | HR 73 | Temp 98.5°F | Resp 20 | Wt 201.2 lb

## 2021-10-11 DIAGNOSIS — C189 Malignant neoplasm of colon, unspecified: Secondary | ICD-10-CM

## 2021-10-11 DIAGNOSIS — Z452 Encounter for adjustment and management of vascular access device: Secondary | ICD-10-CM | POA: Diagnosis not present

## 2021-10-11 NOTE — Progress Notes (Signed)
[facility] OFFICE PROGRESS NOTE   Diagnosis: Colon cancer  INTERVAL HISTORY:   Jeffrey Young returns as scheduled.  Feels well.  He is followed by orthopedics for management of a ulcer at the plantar surface of the left foot.  No new complaint.  Objective:  Vital signs in last 24 hours:  Blood pressure (!) 147/82, pulse 73, temperature 98.5 F (36.9 C), temperature source Oral, resp. rate 20, weight 201 lb 3.2 oz (91.3 kg), SpO2 100 %.   Lymphatics: No cervical, supraclavicular, axillary, or inguinal nodes Resp: Lungs clear bilaterally Cardio: Regular rate and rhythm GI: No hepatosplenomegaly, no mass, nontender Vascular: No leg edema  Skin: 2-3 cm ulcer at the plantar surface of the distal part of the partially amputated left foot  Portacath/PICC-without erythema  Lab Results:  Lab Results  Component Value Date   WBC 6.2 04/13/2021   HGB 8.3 (L) 04/13/2021   HCT 25.4 (L) 04/13/2021   MCV 97.3 04/13/2021   PLT 190 04/13/2021   NEUTROABS 12.7 (H) 04/01/2021    CMP  Lab Results  Component Value Date   NA 134 (L) 04/13/2021   K 3.6 04/13/2021   CL 101 04/13/2021   CO2 29 04/13/2021   GLUCOSE 171 (H) 04/13/2021   BUN 14 04/13/2021   CREATININE 1.56 (H) 04/13/2021   CALCIUM 8.4 (L) 04/13/2021   PROT 6.2 (L) 04/03/2021   ALBUMIN 2.6 (L) 04/03/2021   AST 17 04/03/2021   ALT 9 04/03/2021   ALKPHOS 113 04/03/2021   BILITOT 1.9 (H) 04/03/2021   GFRNONAA 51 (L) 04/13/2021   GFRAA >60 01/26/2018    Lab Results  Component Value Date   CEA1 11.44 (H) 09/27/2020   CEA 34.56 (H) 09/12/2021    Lab Results  Component Value Date   INR 0.9 04/01/2021   LABPROT 12.4 04/01/2021    Imaging:  No results found.  Medications: I have reviewed the patient's current medications.   Assessment/Plan: Descending colon cancer, stage IIIc (T3N2b M0), status post a partial left colectomy 07/30/2020, 9/16 lymph nodes positive, lymphovascular invasion, 1  satellite nodule, negative margins, MSS, no loss of mismatch repair protein expression -History of large polyp in the left side of the colon-referred to [facility] in 05/2018 for procedure canceled secondary to COVID-19 pandemic.  Procedure was not rescheduled. -CT chest/abdomen/pelvis with contrast 07/27/2020-3 small pulmonary nodules less than 5 mm favored to be benign, circumferential luminal narrowing of the distal transverse colon concerning for malignancy, no metastatic adenopathy in the mesentery porta hepatis, no for metastasis. -CEA on 07/27/2020 was 17.3; 37 on 08/30/2020; 33 on 09/13/2020 -Colonoscopy performed 07/27/2020 showed a fungating, infiltrative and ulcerated nonobstructing large mass in the proximal descending colon.  Biopsy-adenocarcinoma -Cycle 1 FOLFOX 08/30/2020 -Cycle 2 FOLFOX 09/13/2020, Emend added for delayed nausea -Cycle 3 FOLFOX 09/27/2020 -Cycle 4 FOLFOX 10/11/2020 -Cycle 5 FOLFOX 11/08/2020 -Cycle 6 FOLFOX 11/23/2020 -CT 12/03/2020-prior 3 mm left apical nodule no longer seen, no new/suspicious pulmonary nodules, no evidence of metastatic disease -Cycle 7 FOLFOX 12/06/2020 -Cycle 8 FOLFOX 12/21/2020 -Cycle 9 FOLFOX 01/03/2021 -Cycle 10 FOLFOX 01/17/2021 -Cycle 11 FOLFOX 01/31/2021, oxaliplatin held secondary to neuropathy symptoms -Cycle 12 FOLFOX 02/15/2021, oxaliplatin held secondary to neuropathy -CT abdomen/pelvis 04/02/2021-no evidence of recurrent colon cancer -CT chest 04/08/2021-no evidence of metastatic disease -Elevated CEA February and March 2023 -08/17/2021 PET scan-no evidence of local recurrence or metastasis -09/26/2021-Guardant-ctDNA detected -Colonoscopy 10/04/2021-negative 2.  Anemia due to GI bleeding, iron deficiency?,  Renal insufficiency? 3.  New onset  acute diastolic CHF March 2119 4.  Diabetes mellitus 5.  Renal insufficiency 6.  Hypertension 7.  History of left transmetatarsal amputation 8.  History of colon polyps 9.  Neuropathy 10.  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  He has a history of colon cancer.  The CEA is elevated in the peripheral blood sample was positive for circulating tumor DNA.  I discussed the implication of the positive ctDNA test with Jeffrey Young and his wife.  He understands the high likelihood of developing progressive colon cancer based on the DNA test and elevated CEA.  No recurrent tumor was detected on a PET scan 08/17/2021 or the colonoscopy last week.  The plan is to continue observation.  He will return for an office visit and repeat CEA in 1 month.  We will plan for restaging CTs and possibly a liver MRI in late June or early July.  Betsy Coder, MD  10/11/2021  10:26 AM

## 2021-10-24 ENCOUNTER — Inpatient Hospital Stay: Payer: 59

## 2021-10-24 ENCOUNTER — Inpatient Hospital Stay: Payer: 59 | Admitting: Nurse Practitioner

## 2021-11-07 ENCOUNTER — Other Ambulatory Visit: Payer: Self-pay

## 2021-11-07 ENCOUNTER — Inpatient Hospital Stay: Payer: 59 | Attending: Oncology

## 2021-11-07 ENCOUNTER — Inpatient Hospital Stay: Payer: 59

## 2021-11-07 ENCOUNTER — Encounter: Payer: Self-pay | Admitting: Nurse Practitioner

## 2021-11-07 ENCOUNTER — Inpatient Hospital Stay (HOSPITAL_BASED_OUTPATIENT_CLINIC_OR_DEPARTMENT_OTHER): Payer: 59 | Admitting: Nurse Practitioner

## 2021-11-07 VITALS — BP 155/90 | HR 79 | Temp 98.8°F | Resp 18 | Ht 73.0 in | Wt 206.3 lb

## 2021-11-07 DIAGNOSIS — I5032 Chronic diastolic (congestive) heart failure: Secondary | ICD-10-CM | POA: Insufficient documentation

## 2021-11-07 DIAGNOSIS — Z9221 Personal history of antineoplastic chemotherapy: Secondary | ICD-10-CM | POA: Insufficient documentation

## 2021-11-07 DIAGNOSIS — Z9049 Acquired absence of other specified parts of digestive tract: Secondary | ICD-10-CM | POA: Diagnosis not present

## 2021-11-07 DIAGNOSIS — C185 Malignant neoplasm of splenic flexure: Secondary | ICD-10-CM | POA: Diagnosis not present

## 2021-11-07 DIAGNOSIS — N289 Disorder of kidney and ureter, unspecified: Secondary | ICD-10-CM | POA: Diagnosis not present

## 2021-11-07 DIAGNOSIS — I110 Hypertensive heart disease with heart failure: Secondary | ICD-10-CM | POA: Insufficient documentation

## 2021-11-07 DIAGNOSIS — Z85038 Personal history of other malignant neoplasm of large intestine: Secondary | ICD-10-CM | POA: Insufficient documentation

## 2021-11-07 DIAGNOSIS — C189 Malignant neoplasm of colon, unspecified: Secondary | ICD-10-CM

## 2021-11-07 DIAGNOSIS — R970 Elevated carcinoembryonic antigen [CEA]: Secondary | ICD-10-CM | POA: Insufficient documentation

## 2021-11-07 DIAGNOSIS — E119 Type 2 diabetes mellitus without complications: Secondary | ICD-10-CM | POA: Diagnosis not present

## 2021-11-07 LAB — BASIC METABOLIC PANEL - CANCER CENTER ONLY
Anion gap: 8 (ref 5–15)
BUN: 36 mg/dL — ABNORMAL HIGH (ref 6–20)
CO2: 26 mmol/L (ref 22–32)
Calcium: 9 mg/dL (ref 8.9–10.3)
Chloride: 99 mmol/L (ref 98–111)
Creatinine: 2.83 mg/dL — ABNORMAL HIGH (ref 0.61–1.24)
GFR, Estimated: 25 mL/min — ABNORMAL LOW (ref >60)
Glucose, Bld: 383 mg/dL — ABNORMAL HIGH (ref 70–99)
Potassium: 4.3 mmol/L (ref 3.5–5.1)
Sodium: 133 mmol/L — ABNORMAL LOW (ref 135–145)

## 2021-11-07 LAB — CEA (ACCESS): CEA (CHCC): 60.19 ng/mL — ABNORMAL HIGH (ref 0.00–5.00)

## 2021-11-07 MED ORDER — HEPARIN SOD (PORK) LOCK FLUSH 100 UNIT/ML IV SOLN
500.0000 [IU] | Freq: Once | INTRAVENOUS | Status: AC | PRN
  Administered 2021-11-07: 500 [IU]

## 2021-11-07 MED ORDER — SODIUM CHLORIDE 0.9% FLUSH
10.0000 mL | INTRAVENOUS | Status: DC | PRN
  Administered 2021-11-07: 10 mL

## 2021-11-07 NOTE — Progress Notes (Signed)
Jeffrey Young OFFICE PROGRESS NOTE   Diagnosis: Colon cancer  INTERVAL HISTORY:   Jeffrey Young returns as scheduled.  He feels well.  He denies pain.  He has a good appetite.  He has occasional nausea which he attributes to a medication.  He is not sure which medication.  Bowels moving regularly.  He notes cold sensitivity.  No numbness or tingling in the absence of cold exposure.  He reports the left foot ulcer is healing.  Objective:  Vital signs in last 24 hours:  Blood pressure (!) 155/90, pulse 79, temperature 98.8 F (37.1 C), temperature source Oral, resp. rate 18, height $RemoveBe'6\' 1"'bOMSNxPyU$  (1.854 m), weight 206 lb 4.8 oz (93.6 kg), SpO2 100 %.    HEENT: White coating over tongue.  No buccal thrush. Lymphatics: No palpable cervical, supraclavicular or axillary lymph nodes. Resp: Lungs clear bilaterally. Cardio: Regular rate and rhythm. GI: Abdomen soft and nontender.  No hepatomegaly. Vascular: No leg edema. Port-A-Cath without erythema.   Lab Results:  Lab Results  Component Value Date   WBC 6.2 04/13/2021   HGB 8.3 (L) 04/13/2021   HCT 25.4 (L) 04/13/2021   MCV 97.3 04/13/2021   PLT 190 04/13/2021   NEUTROABS 12.7 (H) 04/01/2021    Imaging:  No results found.  Medications: I have reviewed the patient's current medications.  Assessment/Plan: Descending colon cancer, stage IIIc (T3N2b M0), status post a partial left colectomy 07/30/2020, 9/16 lymph nodes positive, lymphovascular invasion, 1 satellite nodule, negative margins, MSS, no loss of mismatch repair protein expression -History of large polyp in the left side of the colon-referred to [facility] in 05/2018 for procedure canceled secondary to COVID-19 pandemic.  Procedure was not rescheduled. -CT chest/abdomen/pelvis with contrast 07/27/2020-3 small pulmonary nodules less than 5 mm favored to be benign, circumferential luminal narrowing of the distal transverse colon concerning for malignancy, no metastatic adenopathy  in the mesentery porta hepatis, no for metastasis. -CEA on 07/27/2020 was 17.3; 37 on 08/30/2020; 33 on 09/13/2020 -Colonoscopy performed 07/27/2020 showed a fungating, infiltrative and ulcerated nonobstructing large mass in the proximal descending colon.  Biopsy-adenocarcinoma -Cycle 1 FOLFOX 08/30/2020 -Cycle 2 FOLFOX 09/13/2020, Emend added for delayed nausea -Cycle 3 FOLFOX 09/27/2020 -Cycle 4 FOLFOX 10/11/2020 -Cycle 5 FOLFOX 11/08/2020 -Cycle 6 FOLFOX 11/23/2020 -CT 12/03/2020-prior 3 mm left apical nodule no longer seen, no new/suspicious pulmonary nodules, no evidence of metastatic disease -Cycle 7 FOLFOX 12/06/2020 -Cycle 8 FOLFOX 12/21/2020 -Cycle 9 FOLFOX 01/03/2021 -Cycle 10 FOLFOX 01/17/2021 -Cycle 11 FOLFOX 01/31/2021, oxaliplatin held secondary to neuropathy symptoms -Cycle 12 FOLFOX 02/15/2021, oxaliplatin held secondary to neuropathy -CT abdomen/pelvis 04/02/2021-no evidence of recurrent colon cancer -CT chest 04/08/2021-no evidence of metastatic disease -Elevated CEA February and March 2023 -08/17/2021 PET scan-no evidence of local recurrence or metastasis -09/26/2021-Guardant-ctDNA detected -Colonoscopy 10/04/2021-negative 2.  Anemia due to GI bleeding, iron deficiency?,  Renal insufficiency? 3.  New onset acute diastolic CHF March 4496 4.  Diabetes mellitus 5.  Renal insufficiency 6.  Hypertension 7.  History of left transmetatarsal amputation 8.  History of colon polyps 9.  Neuropathy 10.  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  He remains in clinical remission from colon cancer.  We again reviewed the positive circulating tumor DNA test from May of this year.  He understands there is a high likelihood of developing progressive colon cancer based on this test and the elevated  CEA.  Plan for CT scans in the next 2 to 3 weeks, follow-up appointment  a few days later to review results.  He agrees with this plan.    Ned Card ANP/GNP-BC   11/07/2021  9:15 AM

## 2021-11-08 ENCOUNTER — Telehealth: Payer: Self-pay

## 2021-11-08 NOTE — Telephone Encounter (Signed)
Patient gave verbal understanding. Patient is schedule for CT on the 6/29. He knows to come by and pick up the contrast at front desk.

## 2021-11-08 NOTE — Telephone Encounter (Signed)
-----   Message from Owens Shark, NP sent at 11/08/2021  8:27 AM EDT ----- Please forward the basic metabolic panel to his PCP-note renal function is worse.  Also let Mr. Hinojos know the CEA is higher, plan to proceed with CT scans as we discussed at yesterday's visit.

## 2021-11-17 ENCOUNTER — Encounter (HOSPITAL_BASED_OUTPATIENT_CLINIC_OR_DEPARTMENT_OTHER): Payer: Self-pay

## 2021-11-17 ENCOUNTER — Ambulatory Visit (HOSPITAL_BASED_OUTPATIENT_CLINIC_OR_DEPARTMENT_OTHER)
Admission: RE | Admit: 2021-11-17 | Discharge: 2021-11-17 | Disposition: A | Discharge status: Home / Self Care | Payer: 59 | Source: Ambulatory Visit | Attending: Nurse Practitioner | Admitting: Nurse Practitioner

## 2021-11-17 ENCOUNTER — Inpatient Hospital Stay: Payer: 59

## 2021-11-17 DIAGNOSIS — C185 Malignant neoplasm of splenic flexure: Secondary | ICD-10-CM | POA: Insufficient documentation

## 2021-11-17 DIAGNOSIS — Z85038 Personal history of other malignant neoplasm of large intestine: Secondary | ICD-10-CM | POA: Diagnosis not present

## 2021-11-17 DIAGNOSIS — C189 Malignant neoplasm of colon, unspecified: Secondary | ICD-10-CM

## 2021-11-17 MED ORDER — SODIUM CHLORIDE 0.9% FLUSH
10.0000 mL | INTRAVENOUS | Status: AC | PRN
  Administered 2021-11-17: 10 mL

## 2021-11-17 MED ORDER — HEPARIN SOD (PORK) LOCK FLUSH 100 UNIT/ML IV SOLN
500.0000 [IU] | Freq: Once | INTRAVENOUS | Status: AC | PRN
  Administered 2021-11-17: 500 [IU]

## 2021-11-17 NOTE — Addendum Note (Signed)
Addended by: Tedd Sias on: 11/17/2021 11:56 AM   Modules accepted: Orders

## 2021-11-28 ENCOUNTER — Inpatient Hospital Stay: Payer: 59 | Attending: Oncology | Admitting: Oncology

## 2021-11-28 VITALS — BP 170/91 | HR 88 | Temp 98.1°F | Resp 18 | Ht 73.0 in | Wt 206.0 lb

## 2021-11-28 DIAGNOSIS — Z9049 Acquired absence of other specified parts of digestive tract: Secondary | ICD-10-CM | POA: Diagnosis not present

## 2021-11-28 DIAGNOSIS — I5032 Chronic diastolic (congestive) heart failure: Secondary | ICD-10-CM | POA: Diagnosis not present

## 2021-11-28 DIAGNOSIS — I110 Hypertensive heart disease with heart failure: Secondary | ICD-10-CM | POA: Diagnosis not present

## 2021-11-28 DIAGNOSIS — Z9221 Personal history of antineoplastic chemotherapy: Secondary | ICD-10-CM | POA: Insufficient documentation

## 2021-11-28 DIAGNOSIS — G620 Drug-induced polyneuropathy: Secondary | ICD-10-CM | POA: Diagnosis not present

## 2021-11-28 DIAGNOSIS — K922 Gastrointestinal hemorrhage, unspecified: Secondary | ICD-10-CM | POA: Diagnosis not present

## 2021-11-28 DIAGNOSIS — T451X5D Adverse effect of antineoplastic and immunosuppressive drugs, subsequent encounter: Secondary | ICD-10-CM | POA: Insufficient documentation

## 2021-11-28 DIAGNOSIS — N289 Disorder of kidney and ureter, unspecified: Secondary | ICD-10-CM | POA: Diagnosis not present

## 2021-11-28 DIAGNOSIS — D500 Iron deficiency anemia secondary to blood loss (chronic): Secondary | ICD-10-CM | POA: Insufficient documentation

## 2021-11-28 DIAGNOSIS — D509 Iron deficiency anemia, unspecified: Secondary | ICD-10-CM | POA: Insufficient documentation

## 2021-11-28 DIAGNOSIS — R970 Elevated carcinoembryonic antigen [CEA]: Secondary | ICD-10-CM | POA: Insufficient documentation

## 2021-11-28 DIAGNOSIS — Z8719 Personal history of other diseases of the digestive system: Secondary | ICD-10-CM | POA: Diagnosis not present

## 2021-11-28 DIAGNOSIS — E119 Type 2 diabetes mellitus without complications: Secondary | ICD-10-CM | POA: Diagnosis not present

## 2021-11-28 DIAGNOSIS — Z85048 Personal history of other malignant neoplasm of rectum, rectosigmoid junction, and anus: Secondary | ICD-10-CM | POA: Insufficient documentation

## 2021-11-28 DIAGNOSIS — Z85038 Personal history of other malignant neoplasm of large intestine: Secondary | ICD-10-CM | POA: Diagnosis present

## 2021-11-28 DIAGNOSIS — C189 Malignant neoplasm of colon, unspecified: Secondary | ICD-10-CM | POA: Diagnosis not present

## 2021-11-28 NOTE — Progress Notes (Signed)
[facility] OFFICE PROGRESS NOTE   Diagnosis: Colon cancer  INTERVAL HISTORY:   [name] returns as scheduled.  He feels well.  He reports improvement in neuropathy symptoms.  No new complaint.  No dyspnea.  Objective:  Vital signs in last 24 hours:  Blood pressure (!) 170/91, pulse 88, temperature 98.1 F (36.7 C), temperature source Oral, resp. rate 18, height $RemoveBe'6\' 1"'vxAeygOsQ$  (1.854 m), weight 206 lb (93.4 kg), SpO2 98 %.    HEENT: Neck without mass Lymphatics: No cervical, supraclavicular, axillary, or inguinal nodes Resp: Lungs clear bilaterally Cardio: Regular rate and rhythm GI: No mass, no hepatosplenomegaly, nontender Vascular: The left lower leg is slightly larger than the right side  Portacath/PICC-without erythema  Lab Results:  Lab Results  Component Value Date   WBC 6.2 04/13/2021   HGB 8.3 (L) 04/13/2021   HCT 25.4 (L) 04/13/2021   MCV 97.3 04/13/2021   PLT 190 04/13/2021   NEUTROABS 12.7 (H) 04/01/2021    CMP  Lab Results  Component Value Date   NA 133 (L) 11/07/2021   K 4.3 11/07/2021   CL 99 11/07/2021   CO2 26 11/07/2021   GLUCOSE 383 (H) 11/07/2021   BUN 36 (H) 11/07/2021   CREATININE 2.83 (H) 11/07/2021   CALCIUM 9.0 11/07/2021   PROT 6.2 (L) 04/03/2021   ALBUMIN 2.6 (L) 04/03/2021   AST 17 04/03/2021   ALT 9 04/03/2021   ALKPHOS 113 04/03/2021   BILITOT 1.9 (H) 04/03/2021   GFRNONAA 25 (L) 11/07/2021   GFRAA >60 01/26/2018    Lab Results  Component Value Date   CEA1 11.44 (H) 09/27/2020   CEA 60.19 (H) 11/07/2021     Medications: I have reviewed the patient's current medications.   Assessment/Plan:  Descending colon cancer, stage IIIc (T3N2b M0), status post a partial left colectomy 07/30/2020, 9/16 lymph nodes positive, lymphovascular invasion, 1 satellite nodule, negative margins, MSS, no loss of mismatch repair protein expression -History of large polyp in the left side of the colon-referred to [facility] in 05/2018 for  procedure canceled secondary to COVID-19 pandemic.  Procedure was not rescheduled. -CT chest/abdomen/pelvis with contrast 07/27/2020-3 small pulmonary nodules less than 5 mm favored to be benign, circumferential luminal narrowing of the distal transverse colon concerning for malignancy, no metastatic adenopathy in the mesentery porta hepatis, no for metastasis. -CEA on 07/27/2020 was 17.3; 37 on 08/30/2020; 33 on 09/13/2020 -Colonoscopy performed 07/27/2020 showed a fungating, infiltrative and ulcerated nonobstructing large mass in the proximal descending colon.  Biopsy-adenocarcinoma -Cycle 1 FOLFOX 08/30/2020 -Cycle 2 FOLFOX 09/13/2020, Emend added for delayed nausea -Cycle 3 FOLFOX 09/27/2020 -Cycle 4 FOLFOX 10/11/2020 -Cycle 5 FOLFOX 11/08/2020 -Cycle 6 FOLFOX 11/23/2020 -CT 12/03/2020-prior 3 mm left apical nodule no longer seen, no new/suspicious pulmonary nodules, no evidence of metastatic disease -Cycle 7 FOLFOX 12/06/2020 -Cycle 8 FOLFOX 12/21/2020 -Cycle 9 FOLFOX 01/03/2021 -Cycle 10 FOLFOX 01/17/2021 -Cycle 11 FOLFOX 01/31/2021, oxaliplatin held secondary to neuropathy symptoms -Cycle 12 FOLFOX 02/15/2021, oxaliplatin held secondary to neuropathy -CT abdomen/pelvis 04/02/2021-no evidence of recurrent colon cancer -CT chest 04/08/2021-no evidence of metastatic disease -Elevated CEA February and March 2023 -08/17/2021 PET scan-no evidence of local recurrence or metastasis -09/26/2021-Guardant-ctDNA detected -Colonoscopy 10/04/2021-negative -CT 11/17/2021-no evidence of metastatic disease 2.  Anemia due to GI bleeding, iron deficiency?,  Renal insufficiency? 3.  New onset acute diastolic CHF March 8657 4.  Diabetes mellitus 5.  Renal insufficiency 6.  Hypertension 7.  History of left transmetatarsal amputation 8.  History of colon  polyps 9.  Neuropathy 10.  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 has a history of colon cancer.  The CEA is elevated and ctDNA was detected on a Guardant assay in May.  A PET scan in late March, surveillance colonoscopy, and CT 11/17/2021 have not revealed evidence of measurable disease.  [name] understands the positive Guardant assay and rising CEA are likely indicative of metastatic colon cancer.  The plan is to continue close observation.  He will return for an office and lab visit in 6 weeks.  The Port-A-Cath will remain in place.  Betsy Coder, MD  11/28/2021  8:47 AM

## 2021-11-30 ENCOUNTER — Encounter: Payer: Self-pay | Admitting: Family

## 2021-11-30 ENCOUNTER — Ambulatory Visit (INDEPENDENT_AMBULATORY_CARE_PROVIDER_SITE_OTHER): Payer: 59 | Admitting: Family

## 2021-11-30 DIAGNOSIS — L97521 Non-pressure chronic ulcer of other part of left foot limited to breakdown of skin: Secondary | ICD-10-CM | POA: Diagnosis not present

## 2021-11-30 DIAGNOSIS — Z89432 Acquired absence of left foot: Secondary | ICD-10-CM | POA: Diagnosis not present

## 2021-11-30 NOTE — Progress Notes (Signed)
Office Visit Note   Patient: Jeffrey Young           Date of Birth: 08-28-1965           MRN: [id number] Visit Date: 11/30/2021              Requested by: [name], MD [place] [address] [place],  [place] [id number] PCP: [name], MD  Chief Complaint  Patient presents with   Left Foot - Wound Check, Follow-up      HPI: The patient is a 56 year old gentleman who presents today in routine follow-up for Wagner grade 1 ulcer to his left foot he is status post transmetatarsal amputation on the left.  Has been in his regular shoewear has resumed his extra-depth shoes with custom orthotics is full weightbearing has been applying Band-Aids  His wife is concerned for worsening   he is currently working with the [employer] for approval for new custom orthotics  Assessment & Plan: Visit Diagnoses: No diagnosis found.  Plan: Ulcer debrided back to viable tissue.  Patient tolerated well.  He will continue with dry dressing changes minimize weightbearing discussed the importance of minimizing weightbearing and pressure over the ulcer for healing  Follow-Up Instructions: No follow-ups on file.   Ortho Exam  Patient is alert, oriented, no adenopathy, well-dressed, normal affect, normal respiratory effort. On examination of the left foot there is Wagner grade 1 ulcer on the plantar aspect of the foot over the medial column this is 3 cm in diameter central ulceration is 15 mm in diameter and 5 mm deep with proud granulation there is no active drainage there is surrounding maceration no erythema of the foot. no sign of infection.  Imaging: No results found. No images are attached to the encounter.  Labs: Lab Results  Component Value Date   HGBA1C 11.4 (H) 04/02/2021   HGBA1C 6.7 (H) 07/29/2020   HGBA1C 7.2 (H) 07/25/2020   ESRSEDRATE 121 (H) 01/23/2018   CRP 21.4 (H) 01/23/2018   REPTSTATUS 04/03/2021 FINAL 04/01/2021   GRAMSTAIN  01/22/2018    RARE WBC PRESENT,  PREDOMINANTLY PMN MODERATE GRAM POSITIVE COCCI MODERATE GRAM NEGATIVE RODS FEW GRAM POSITIVE RODS Performed at [facility], [place] [address] [place], [place] [address]    CULT  04/01/2021    NO GROWTH Performed at [facility] [address], [place], [place] [address]    LABORGA PROTEUS MIRABILIS 01/22/2018     Lab Results  Component Value Date   ALBUMIN 2.6 (L) 04/03/2021   ALBUMIN 3.4 (L) 04/01/2021   ALBUMIN 3.4 (L) 02/28/2021   PREALBUMIN 6.2 (L) 01/23/2018    Lab Results  Component Value Date   MG 1.8 04/13/2021   MG 1.8 04/12/2021   MG 1.8 04/11/2021   No results found for: "VD25OH"  Lab Results  Component Value Date   PREALBUMIN 6.2 (L) 01/23/2018      Latest Ref Rng & Units 04/13/2021    3:45 AM 04/12/2021    4:25 AM 04/11/2021    3:00 AM  CBC EXTENDED  WBC 4.0 - 10.5 K/uL 6.2  6.0  6.6   RBC 4.22 - 5.81 MIL/uL 2.61  2.75  2.73   Hemoglobin 13.0 - 17.0 g/dL 8.3  8.8  8.7   HCT 39.0 - 52.0 % 25.4  26.9  26.5   Platelets 150 - 400 K/uL 190  209  208      There is no height or weight on  file to calculate BMI.  Orders:  No orders of the defined types were placed in this encounter.  No orders of the defined types were placed in this encounter.    Procedures: No procedures performed  Clinical Data: No additional findings.  ROS:  All other systems negative, except as noted in the HPI. Review of Systems  Objective: Vital Signs: There were no vitals taken for this visit.  Specialty Comments:  No specialty comments available.  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 07/30/2020 07/31/2020   IDA (iron deficiency anemia) from bleeding colon cancer 07/31/2020   Diabetic retinopathy ([place]) 07/31/2020   Hyperlipidemia 07/31/2020   Low back pain 07/31/2020   Shortness of breath 76/22/6333   Systolic heart failure ([place]) 07/31/2020   CKD (chronic kidney disease) stage 3, GFR 30-59 ml/min ([place]) 07/31/2020   Insulin-requiring or dependent type II diabetes mellitus ([place]) 07/31/2020   ARF (acute renal failure) ([place]) 07/25/2020   Symptomatic anemia    New onset of congestive heart failure ([place]) 07/24/2020   GIB (gastrointestinal bleeding) 07/24/2020   Hypertension associated with diabetes ([place]) 06/07/2019   Erectile dysfunction associated with type 2 diabetes mellitus ([place]) 05/08/2019   Sepsis ([place]) 01/22/2018   Diabetic ulcer of left foot ([place]) 01/22/2018   Status post transmetatarsal amputation of left foot ([place]) 01/22/2018   Diabetic mononeuropathy associated with type 2 diabetes mellitus ([place]) 03/21/2017   Microalbuminuria due to type 2 diabetes mellitus ([place]) 03/21/2017   Vitamin D deficiency 04/06/2009   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 Type 2 Diabetes Mellitus - Uncomplicated, Uncontrolled    Family History  Problem Relation Age of Onset   Hypertension Father     Past Surgical History:  Procedure Laterality Date   AMPUTATION Left 01/22/2018   Procedure: TRANSMETATARSAL AMPUTATION;  Surgeon: Newt Minion, MD;  Location: [facility];  Service: Orthopedics;  Laterality: Left;toes   BIOPSY  07/27/2020   Procedure:  BIOPSY;  Surgeon: Carol Ada, MD;  [facility];  Service: Endoscopy;;   COLON RESECTION N/A 07/30/2020   Procedure: HAND ASSISTED LAPAROSCOPIC LEFT HEMI COLECTOMY;  Surgeon: Johnathan Hausen, MD;  [facility];  Service: General;  Laterality: N/A;   COLONOSCOPY WITH PROPOFOL N/A 05/24/2018   Procedure: COLONOSCOPY WITH PROPOFOL;  Surgeon: Carol Ada, MD;  [facility];  Service: Endoscopy;  Laterality: N/A;   COLONOSCOPY WITH PROPOFOL N/A 07/27/2020   Procedure: COLONOSCOPY WITH PROPOFOL;  Surgeon: Carol Ada, MD;  [facility];  Service: Endoscopy;  Laterality: N/A;   ESOPHAGOGASTRODUODENOSCOPY Left 04/05/2021   Procedure: ESOPHAGOGASTRODUODENOSCOPY (EGD);  Surgeon: Carol Ada, MD;  [facility];  Service: Endoscopy;  Laterality: Left;   POLYPECTOMY  05/24/2018   Procedure: POLYPECTOMY;  Surgeon: Carol Ada, MD;  [facility];  Service: Endoscopy;;   PORTACATH PLACEMENT Left 08/24/2020   Procedure: INSERTION PORT-A-CATH;  Surgeon: Johnathan Hausen, MD;  [facility];  Service: General;  Laterality: Left;  75/rm1   SUBMUCOSAL TATTOO INJECTION  07/27/2020   Procedure: SUBMUCOSAL TATTOO  INJECTION;  Surgeon: Carol Ada, MD;  [facility];  Service: Endoscopy;;   Social History   Occupational History   Not on file  Tobacco Use   Smoking status: Never   Smokeless tobacco: Never  Vaping Use   Vaping Use: Never used  Substance and Sexual Activity   Alcohol use: Yes    Alcohol/week: 1.0 standard drink of alcohol    Types: 1 Cans of beer per week    Comment: occasional   Drug use: Never   Sexual activity: Yes

## 2021-12-08 ENCOUNTER — Telehealth: Payer: Self-pay | Admitting: *Deleted

## 2021-12-08 NOTE — Telephone Encounter (Signed)
Wife left VM on 7/19 that his company has not received the FMLA form yet. Pulled completed form and mailed to: [employer] Att: [employer] [address] [place], [place] [address] Left VM for wife that form has been mailed.

## 2021-12-12 ENCOUNTER — Other Ambulatory Visit: Payer: Self-pay

## 2021-12-12 ENCOUNTER — Ambulatory Visit: Payer: 59 | Admitting: Orthopedic Surgery

## 2021-12-26 ENCOUNTER — Encounter: Payer: Self-pay | Admitting: Orthopedic Surgery

## 2021-12-26 ENCOUNTER — Ambulatory Visit (INDEPENDENT_AMBULATORY_CARE_PROVIDER_SITE_OTHER): Payer: 59 | Admitting: Orthopedic Surgery

## 2021-12-26 DIAGNOSIS — Z89432 Acquired absence of left foot: Secondary | ICD-10-CM | POA: Diagnosis not present

## 2021-12-26 DIAGNOSIS — L97521 Non-pressure chronic ulcer of other part of left foot limited to breakdown of skin: Secondary | ICD-10-CM

## 2021-12-26 NOTE — Progress Notes (Signed)
Office Visit Note   Patient: Jeffrey Young           Date of Birth: 1966/05/15           MRN: [id number] Visit Date: 12/26/2021              Requested by: [name], MD [place] [address] [place],  [place] [id number] PCP: [name], MD  Chief Complaint  Patient presents with   Left Foot - Wound Check      HPI: Patient is a 56 year old gentleman who presents in follow-up for [facility] grade 1 ulcer left foot status post transmetatarsal amputation.  Patient is currently in regular shoewear.  Assessment & Plan: Visit Diagnoses:  1. History of transmetatarsal amputation of left foot ([place])   2. Non-pressure chronic ulcer of other part of left foot limited to breakdown of skin ([place])     Plan: Continue with protected weightbearing.  Follow-Up Instructions: Return in about 4 weeks (around 01/23/2022).   Ortho Exam  Patient is alert, oriented, no adenopathy, well-dressed, normal affect, normal respiratory effort. Examination the plantar wound continues to improve.  His foot is plantigrade there is no redness or cellulitis.  After informed consent a 10 blade knife was used to debride the skin and soft tissue of the left foot ulcer.  After debridement the ulcer is 3 cm in diameter 2 mm deep there is healthy granulation tissue no exposed bone or tendon.  No tunneling.  Imaging: No results found. No images are attached to the encounter.  Labs: Lab Results  Component Value Date   HGBA1C 11.4 (H) 04/02/2021   HGBA1C 6.7 (H) 07/29/2020   HGBA1C 7.2 (H) 07/25/2020   ESRSEDRATE 121 (H) 01/23/2018   CRP 21.4 (H) 01/23/2018   REPTSTATUS 04/03/2021 FINAL 04/01/2021   GRAMSTAIN  01/22/2018    RARE WBC PRESENT, PREDOMINANTLY PMN MODERATE GRAM POSITIVE COCCI MODERATE GRAM NEGATIVE RODS FEW GRAM POSITIVE RODS Performed at [facility], [place] [address] [place], [place] [address]    CULT  04/01/2021    NO GROWTH Performed at [facility] [address], [place], [place] [address]    LABORGA PROTEUS MIRABILIS 01/22/2018     Lab Results  Component Value Date   ALBUMIN 2.6 (L) 04/03/2021   ALBUMIN 3.4 (L) 04/01/2021   ALBUMIN 3.4 (L) 02/28/2021   PREALBUMIN 6.2 (L) 01/23/2018    Lab Results  Component Value Date   MG 1.8 04/13/2021   MG 1.8 04/12/2021   MG 1.8 04/11/2021   No results found for: "VD25OH"  Lab Results  Component Value Date   PREALBUMIN 6.2 (L) 01/23/2018      Latest Ref Rng & Units 04/13/2021    3:45 AM 04/12/2021    4:25 AM 04/11/2021    3:00 AM  CBC EXTENDED  WBC 4.0 - 10.5 K/uL 6.2  6.0  6.6   RBC 4.22 - 5.81 MIL/uL 2.61  2.75  2.73   Hemoglobin 13.0 - 17.0 g/dL 8.3  8.8  8.7   HCT 39.0 - 52.0 % 25.4  26.9  26.5   Platelets 150 - 400 K/uL 190  209  208      There is no height or weight on file to calculate BMI.  Orders:  No orders of the defined types were placed in this encounter.  No orders of the defined types were placed in this encounter.    Procedures: No procedures performed  Clinical Data: No  additional findings.  ROS:  All other systems negative, except as noted in the HPI. Review of Systems  Objective: Vital Signs: There were no vitals taken for this visit.  Specialty Comments:  No specialty comments available.  PMFS History: Patient Active Problem List   Diagnosis Date Noted   Malignant neoplasm of colon ([place])    GI bleed 04/02/2021   Severe sepsis ([place]) 04/02/2021   AKI (acute kidney injury) ([place]) 40/98/1191   Acute metabolic encephalopathy 47/82/9562   Hypokalemia 04/02/2021   Encephalopathy    Nausea and vomiting    Coffee ground emesis    Elevated bilirubin    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 07/31/2020   IDA (iron deficiency anemia) from bleeding colon cancer 07/31/2020   Diabetic retinopathy ([place]) 07/31/2020   Hyperlipidemia 07/31/2020   Low back pain 07/31/2020   Shortness of breath 13/12/6576   Systolic heart failure ([place])  07/31/2020   CKD (chronic kidney disease) stage 3, GFR 30-59 ml/min ([place]) 07/31/2020   Insulin-requiring or dependent type II diabetes mellitus ([place]) 07/31/2020   ARF (acute renal failure) ([place]) 07/25/2020   Symptomatic anemia    New onset of congestive heart failure ([place]) 07/24/2020   GIB (gastrointestinal bleeding) 07/24/2020   Hypertension associated with diabetes ([place]) 06/07/2019   Erectile dysfunction associated with type 2 diabetes mellitus ([place]) 05/08/2019   Sepsis ([place]) 01/22/2018   Diabetic ulcer of left foot ([place]) 01/22/2018   Status post transmetatarsal amputation of left foot ([place]) 01/22/2018   Diabetic mononeuropathy associated with type 2 diabetes mellitus ([place]) 03/21/2017   Microalbuminuria due to type 2 diabetes mellitus ([place]) 03/21/2017   Vitamin D deficiency 04/06/2009   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 Metatarsalgia  10/1 IMO update   Erectile dysfunction associated with type 2 diabetes mellitus ([place]) 05/08/2019   Hyperlipidemia 07/31/2020   Hypertension associated with diabetes ([place]) 06/07/2019   Microalbuminuria due to type 2 diabetes mellitus ([place]) 03/21/2017   Necrotizing fasciitis of ankle and foot ([place]) 01/22/2018   Necrotizing soft tissue infection    Status post transmetatarsal amputation of left foot ([place]) 08/26/9627   Systolic heart failure ([place]) 07/31/2020   Uncontrolled type 2 diabetes mellitus with both eyes affected by severe nonproliferative retinopathy and macular  edema, with long-term current use of insulin 04/02/2009   Formatting of this note might be different from the original. Type 2 Diabetes Mellitus - Uncomplicated, Uncontrolled    Family History  Problem Relation Age of Onset   Hypertension Father     Past Surgical History:  Procedure Laterality Date   AMPUTATION Left 01/22/2018   Procedure: TRANSMETATARSAL AMPUTATION;  Surgeon: Newt Minion, MD;  Location: [facility];  Service: Orthopedics;  Laterality: Left;toes   BIOPSY  07/27/2020   Procedure: BIOPSY;  Surgeon: Carol Ada, MD;  [facility];  Service: Endoscopy;;   COLON RESECTION N/A 07/30/2020   Procedure: HAND ASSISTED LAPAROSCOPIC LEFT HEMI COLECTOMY;  Surgeon: Johnathan Hausen, MD;  [facility];  Service: General;  Laterality: N/A;   COLONOSCOPY WITH PROPOFOL N/A 05/24/2018   Procedure: COLONOSCOPY WITH PROPOFOL;  Surgeon: Carol Ada, MD;  [facility];  Service: Endoscopy;  Laterality: N/A;   COLONOSCOPY WITH PROPOFOL N/A 07/27/2020   Procedure: COLONOSCOPY WITH PROPOFOL;  Surgeon: Carol Ada, MD;  [facility];  Service: Endoscopy;  Laterality: N/A;   ESOPHAGOGASTRODUODENOSCOPY Left 04/05/2021   Procedure: ESOPHAGOGASTRODUODENOSCOPY (EGD);  Surgeon: Carol Ada, MD;  [facility];  Service: Endoscopy;  Laterality: Left;   POLYPECTOMY  05/24/2018   Procedure: POLYPECTOMY;  Surgeon: Carol Ada, MD;  [facility];  Service: Endoscopy;;   PORTACATH PLACEMENT Left 08/24/2020   Procedure: INSERTION PORT-A-CATH;  Surgeon: Johnathan Hausen, MD;  [facility];  Service: General;  Laterality: Left;  75/rm1   SUBMUCOSAL TATTOO INJECTION  07/27/2020   Procedure: SUBMUCOSAL TATTOO INJECTION;  Surgeon: Carol Ada, MD;  [facility];  Service: Endoscopy;;   Social History   Occupational History   Not on file  Tobacco Use   Smoking status: Never   Smokeless tobacco: Never  Vaping Use   Vaping Use: Never used  Substance  and Sexual Activity   Alcohol use: Yes    Alcohol/week: 1.0 standard drink of alcohol    Types: 1 Cans of beer per week    Comment: occasional   Drug use: Never   Sexual activity: Yes

## 2022-01-09 ENCOUNTER — Encounter: Payer: Self-pay | Admitting: Nurse Practitioner

## 2022-01-09 ENCOUNTER — Inpatient Hospital Stay (HOSPITAL_BASED_OUTPATIENT_CLINIC_OR_DEPARTMENT_OTHER): Payer: 59 | Admitting: Nurse Practitioner

## 2022-01-09 ENCOUNTER — Inpatient Hospital Stay: Payer: 59 | Attending: Oncology

## 2022-01-09 ENCOUNTER — Inpatient Hospital Stay: Payer: 59

## 2022-01-09 VITALS — BP 143/90 | HR 73 | Temp 98.1°F | Resp 20 | Ht 73.0 in | Wt 203.6 lb

## 2022-01-09 DIAGNOSIS — G620 Drug-induced polyneuropathy: Secondary | ICD-10-CM | POA: Diagnosis not present

## 2022-01-09 DIAGNOSIS — Z9221 Personal history of antineoplastic chemotherapy: Secondary | ICD-10-CM | POA: Diagnosis not present

## 2022-01-09 DIAGNOSIS — I5032 Chronic diastolic (congestive) heart failure: Secondary | ICD-10-CM | POA: Insufficient documentation

## 2022-01-09 DIAGNOSIS — Z85038 Personal history of other malignant neoplasm of large intestine: Secondary | ICD-10-CM | POA: Insufficient documentation

## 2022-01-09 DIAGNOSIS — C189 Malignant neoplasm of colon, unspecified: Secondary | ICD-10-CM

## 2022-01-09 DIAGNOSIS — E119 Type 2 diabetes mellitus without complications: Secondary | ICD-10-CM | POA: Insufficient documentation

## 2022-01-09 DIAGNOSIS — N289 Disorder of kidney and ureter, unspecified: Secondary | ICD-10-CM | POA: Diagnosis not present

## 2022-01-09 DIAGNOSIS — I110 Hypertensive heart disease with heart failure: Secondary | ICD-10-CM | POA: Diagnosis not present

## 2022-01-09 DIAGNOSIS — D649 Anemia, unspecified: Secondary | ICD-10-CM | POA: Diagnosis not present

## 2022-01-09 LAB — CEA (ACCESS): CEA (CHCC): 137.53 ng/mL — ABNORMAL HIGH (ref 0.00–5.00)

## 2022-01-09 MED ORDER — HEPARIN SOD (PORK) LOCK FLUSH 100 UNIT/ML IV SOLN
500.0000 [IU] | Freq: Once | INTRAVENOUS | Status: AC | PRN
  Administered 2022-01-09: 500 [IU]

## 2022-01-09 MED ORDER — LIDOCAINE-PRILOCAINE 2.5-2.5 % EX CREA: 1.0000 | TOPICAL_CREAM | CUTANEOUS | 2 refills | Status: DC

## 2022-01-09 MED ORDER — SODIUM CHLORIDE 0.9% FLUSH
10.0000 mL | INTRAVENOUS | Status: DC | PRN
  Administered 2022-01-09: 10 mL

## 2022-01-09 NOTE — Patient Instructions (Signed)

## 2022-01-09 NOTE — Progress Notes (Signed)
Jeffrey Young OFFICE PROGRESS NOTE   Diagnosis: Colon cancer  INTERVAL HISTORY:   Jeffrey Young returns as scheduled.  Overall he feels well.  No pain.  No change in bowel habits.  Appetite described as "pretty good".  Energy level varies.  Cold sensitivity is better.  He continues to note numbness/tingling in the fingertips and toes unrelated to cold exposure.  Objective:  Vital signs in last 24 hours:  Blood pressure (!) 143/90, pulse 73, temperature 98.1 F (36.7 C), temperature source Oral, resp. rate 20, height _0  (1.854 m), weight 203 lb 9.6 oz (92.4 kg), SpO2 100 %.    HEENT: No thrush or ulcers. Lymphatics: No palpable cervical, supraclavicular, axillary or inguinal lymph nodes. Resp: Lungs clear bilaterally. Cardio: Rate and rhythm. GI: No hepatosplenomegaly. Vascular: No leg edema. Portacath without erythema.  Lab Results:  Lab Results  Component Value Date   WBC 6.2 04/13/2021   HGB 8.3 (L) 04/13/2021   HCT 25.4 (L) 04/13/2021   MCV 97.3 04/13/2021   PLT 190 04/13/2021   NEUTROABS 12.7 (H) 04/01/2021    Imaging:  No results found.  Medications: I have reviewed the patient's current medications.  Assessment/Plan: Descending colon cancer, stage IIIc (T3N2b M0), status post a partial left colectomy 07/30/2020, 9/16 lymph nodes positive, lymphovascular invasion, 1 satellite nodule, negative margins, MSS, no loss of mismatch repair protein expression -History of large polyp in the left side of the colon-referred to [facility] in 05/2018 for procedure canceled secondary to COVID-19 pandemic.  Procedure was not rescheduled. -CT chest/abdomen/pelvis with contrast 07/27/2020-3 small pulmonary nodules less than 5 mm favored to be benign, circumferential luminal narrowing of the distal transverse colon concerning for malignancy, no metastatic adenopathy in the mesentery porta hepatis, no for metastasis. -CEA on 07/27/2020 was 17.3; 37 on 08/30/2020; 33 on  09/13/2020 -Colonoscopy performed 07/27/2020 showed a fungating, infiltrative and ulcerated nonobstructing large mass in the proximal descending colon.  Biopsy-adenocarcinoma -Cycle 1 FOLFOX 08/30/2020 -Cycle 2 FOLFOX 09/13/2020, Emend added for delayed nausea -Cycle 3 FOLFOX 09/27/2020 -Cycle 4 FOLFOX 10/11/2020 -Cycle 5 FOLFOX 11/08/2020 -Cycle 6 FOLFOX 11/23/2020 -CT 12/03/2020-prior 3 mm left apical nodule no longer seen, no new/suspicious pulmonary nodules, no evidence of metastatic disease -Cycle 7 FOLFOX 12/06/2020 -Cycle 8 FOLFOX 12/21/2020 -Cycle 9 FOLFOX 01/03/2021 -Cycle 10 FOLFOX 01/17/2021 -Cycle 11 FOLFOX 01/31/2021, oxaliplatin held secondary to neuropathy symptoms -Cycle 12 FOLFOX 02/15/2021, oxaliplatin held secondary to neuropathy -CT abdomen/pelvis 04/02/2021-no evidence of recurrent colon cancer -CT chest 04/08/2021-no evidence of metastatic disease -Elevated CEA February and March 2023 -08/17/2021 PET scan-no evidence of local recurrence or metastasis -09/26/2021-Guardant-ctDNA detected -Colonoscopy 10/04/2021-negative -CT 11/17/2021-no evidence of metastatic disease 2.  Anemia due to GI bleeding, iron deficiency?,  Renal insufficiency? 3.  New onset acute diastolic CHF March 3953 4.  Diabetes mellitus 5.  Renal insufficiency 6.  Hypertension 7.  History of left transmetatarsal amputation 8.  History of colon polyps 9.  Neuropathy 10.  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  There is no clinical evidence of disease progression.  We will follow-up on the CEA from today.  We again reviewed the likelihood of the positive guardant assay and rising CEA representing metastatic colon cancer.  He will return for port flush, CEA and follow-up in 6 weeks.    Jeffrey Young ANP/GNP-BC   01/09/2022  8:52 AM

## 2022-01-12 ENCOUNTER — Telehealth: Payer: Self-pay

## 2022-01-12 ENCOUNTER — Other Ambulatory Visit: Payer: Self-pay | Admitting: Nurse Practitioner

## 2022-01-12 DIAGNOSIS — C185 Malignant neoplasm of splenic flexure: Secondary | ICD-10-CM

## 2022-01-12 NOTE — Telephone Encounter (Signed)
Patient verbal understanding and he is schedule on 01/16/25 at [facility] 6 NPO 4 hours prior.

## 2022-01-12 NOTE — Telephone Encounter (Signed)
-----   Message from Owens Shark, NP sent at 01/12/2022  8:07 AM EDT ----- Please let him know the CEA was higher.  Recommend CT scans prior to next office visit.  I will enter the orders.

## 2022-01-16 ENCOUNTER — Ambulatory Visit (HOSPITAL_BASED_OUTPATIENT_CLINIC_OR_DEPARTMENT_OTHER)
Admission: RE | Admit: 2022-01-16 | Discharge: 2022-01-16 | Disposition: A | Discharge status: Home / Self Care | Payer: 59 | Source: Ambulatory Visit | Attending: Nurse Practitioner | Admitting: Nurse Practitioner

## 2022-01-16 DIAGNOSIS — C185 Malignant neoplasm of splenic flexure: Secondary | ICD-10-CM | POA: Insufficient documentation

## 2022-01-26 ENCOUNTER — Encounter: Payer: Self-pay | Admitting: Orthopedic Surgery

## 2022-01-26 ENCOUNTER — Ambulatory Visit (INDEPENDENT_AMBULATORY_CARE_PROVIDER_SITE_OTHER): Payer: 59 | Admitting: Orthopedic Surgery

## 2022-01-26 DIAGNOSIS — L97521 Non-pressure chronic ulcer of other part of left foot limited to breakdown of skin: Secondary | ICD-10-CM | POA: Diagnosis not present

## 2022-01-26 DIAGNOSIS — Z89432 Acquired absence of left foot: Secondary | ICD-10-CM | POA: Diagnosis not present

## 2022-01-26 NOTE — Progress Notes (Signed)
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  Patient feels like his foot is getting better.  Assessment & Plan: Visit Diagnoses:  1. History of transmetatarsal amputation of left foot ([place])   2. Non-pressure chronic ulcer of other part of left foot limited to breakdown of skin ([place])     Plan: Ulcer was debrided continue with protective shoe wear dressing changes daily.  Follow-Up Instructions: Return in about 4 weeks (around 02/23/2022).   Ortho Exam  Patient is alert, oriented, no adenopathy, well-dressed, normal affect, normal respiratory effort. Examination patient has a palpable pulse.  He has a large Wagner grade 1 ulcer on the plantar aspect the left foot.  After informed consent a 10 blade knife was used to debride the skin and soft tissue back to bleeding viable granulation tissue.  The ulcer had healthy granulation tissue no undermining no tunneling to bone or tendon no abscess no drainage.  After debridement the ulcer is 5 cm in diameter and 5 mm deep.  Sterile dressing was applied.  Imaging: No results found. No images are attached to the encounter.  Labs: Lab Results  Component Value Date   HGBA1C 11.4 (H) 04/02/2021   HGBA1C 6.7 (H) 07/29/2020   HGBA1C 7.2 (H) 07/25/2020   ESRSEDRATE 121 (H) 01/23/2018   CRP 21.4 (H) 01/23/2018   REPTSTATUS 04/03/2021 FINAL 04/01/2021   GRAMSTAIN  01/22/2018    RARE WBC PRESENT, PREDOMINANTLY PMN MODERATE GRAM POSITIVE COCCI MODERATE GRAM NEGATIVE RODS FEW GRAM POSITIVE RODS Performed at  [facility], [place] [address], [place], [place] [address]    CULT  04/01/2021    NO GROWTH Performed at [facility] [address], [place], [place] [address]    LABORGA PROTEUS MIRABILIS 01/22/2018     Lab Results  Component Value Date   ALBUMIN 2.6 (L) 04/03/2021   ALBUMIN 3.4 (L) 04/01/2021   ALBUMIN 3.4 (L) 02/28/2021   PREALBUMIN 6.2 (L) 01/23/2018    Lab Results  Component Value Date   MG 1.8 04/13/2021   MG 1.8 04/12/2021   MG 1.8 04/11/2021   No results found for: "VD25OH"  Lab Results  Component Value Date   PREALBUMIN 6.2 (L) 01/23/2018      Latest Ref Rng & Units 04/13/2021    3:45 AM 04/12/2021    4:25 AM 04/11/2021    3:00 AM  CBC EXTENDED  WBC 4.0 - 10.5 K/uL 6.2  6.0  6.6   RBC 4.22 - 5.81 MIL/uL 2.61  2.75  2.73   Hemoglobin 13.0 - 17.0 g/dL 8.3  8.8  8.7   HCT 39.0 - 52.0 % 25.4  26.9  26.5   Platelets 150 - 400 K/uL 190  209  208      There is no height or weight on file to calculate BMI.  Orders:  No orders of the defined types  were placed in this encounter.  No orders of the defined types were placed in this encounter.    Procedures: No procedures performed  Clinical Data: No additional findings.  ROS:  All other systems negative, except as noted in the HPI. Review of Systems  Objective: Vital Signs: There were no vitals taken for this visit.  Specialty Comments:  No specialty comments available.  PMFS History: Patient Active Problem List   Diagnosis Date Noted   Malignant neoplasm of colon ([place])    GI bleed 04/02/2021   Severe sepsis ([place]) 04/02/2021   AKI (acute kidney injury) ([place]) 40/34/7425   Acute metabolic encephalopathy 95/63/8756   Hypokalemia 04/02/2021   Encephalopathy    Nausea and vomiting    Coffee ground emesis    Elevated bilirubin    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 07/31/2020   IDA (iron deficiency anemia) from bleeding colon cancer 07/31/2020   Diabetic  retinopathy ([place]) 07/31/2020   Hyperlipidemia 07/31/2020   Low back pain 07/31/2020   Shortness of breath 43/32/9518   Systolic heart failure ([place]) 07/31/2020   CKD (chronic kidney disease) stage 3, GFR 30-59 ml/min ([place]) 07/31/2020   Insulin-requiring or dependent type II diabetes mellitus ([place]) 07/31/2020   ARF (acute renal failure) ([place]) 07/25/2020   Symptomatic anemia    New onset of congestive heart failure ([place]) 07/24/2020   GIB (gastrointestinal bleeding) 07/24/2020   Hypertension associated with diabetes ([place]) 06/07/2019   Erectile dysfunction associated with type 2 diabetes mellitus ([place]) 05/08/2019   Sepsis ([place]) 01/22/2018   Diabetic ulcer of left foot ([place]) 01/22/2018   Status post transmetatarsal amputation of left foot ([place]) 01/22/2018   Diabetic mononeuropathy associated with type 2 diabetes mellitus ([place]) 03/21/2017   Microalbuminuria due to type 2 diabetes mellitus ([place]) 03/21/2017   Vitamin D deficiency 04/06/2009   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 Type 2 Diabetes Mellitus - Uncomplicated, Uncontrolled    Family History  Problem Relation Age of Onset   Hypertension Father     Past Surgical History:  Procedure Laterality Date   AMPUTATION Left 01/22/2018   Procedure: TRANSMETATARSAL AMPUTATION;  Surgeon: Newt Minion, MD;  Location: [facility];  Service: Orthopedics;  Laterality: Left;toes   BIOPSY  07/27/2020   Procedure: BIOPSY;  Surgeon: Carol Ada, MD;  [facility];  Service: Endoscopy;;  COLON RESECTION N/A 07/30/2020   Procedure: HAND ASSISTED LAPAROSCOPIC LEFT HEMI COLECTOMY;  Surgeon: Johnathan Hausen, MD;  [facility];  Service: General;  Laterality: N/A;   COLONOSCOPY WITH PROPOFOL N/A 05/24/2018   Procedure: COLONOSCOPY WITH PROPOFOL;  Surgeon: Carol Ada, MD;  [facility];  Service: Endoscopy;  Laterality: N/A;   COLONOSCOPY WITH PROPOFOL N/A 07/27/2020   Procedure: COLONOSCOPY WITH PROPOFOL;  Surgeon: Carol Ada, MD;  [facility];  Service: Endoscopy;  Laterality: N/A;   ESOPHAGOGASTRODUODENOSCOPY Left 04/05/2021   Procedure: ESOPHAGOGASTRODUODENOSCOPY (EGD);  Surgeon: Carol Ada, MD;  [facility];  Service: Endoscopy;  Laterality: Left;   POLYPECTOMY  05/24/2018   Procedure: POLYPECTOMY;  Surgeon: Carol Ada, MD;  [facility];  Service: Endoscopy;;   PORTACATH PLACEMENT Left 08/24/2020   Procedure: INSERTION PORT-A-CATH;  Surgeon: Johnathan Hausen, MD;  [facility];  Service: General;  Laterality: Left;  75/rm1   SUBMUCOSAL TATTOO INJECTION  07/27/2020   Procedure: SUBMUCOSAL TATTOO INJECTION;  Surgeon: Carol Ada, MD;  [facility];  Service: Endoscopy;;   Social History    Occupational History   Not on file  Tobacco Use   Smoking status: Never   Smokeless tobacco: Never  Vaping Use   Vaping Use: Never used  Substance and Sexual Activity   Alcohol use: Yes    Alcohol/week: 1.0 standard drink of alcohol    Types: 1 Cans of beer per week    Comment: occasional   Drug use: Never   Sexual activity: Yes

## 2022-01-27 NOTE — Progress Notes (Unsigned)
{  [id number]} 

## 2022-01-30 ENCOUNTER — Telehealth: Payer: Self-pay

## 2022-01-30 NOTE — Telephone Encounter (Signed)
-----   Message from Owens Shark, NP sent at 01/30/2022  2:12 PM EDT ----- Please let him know the CT scan showed tiny stable lung nodules, no evidence of progressive cancer.

## 2022-01-30 NOTE — Telephone Encounter (Signed)
Mrs. Jeffrey Young called and stated she need Jeffrey Young CT scan read. Per Lattie Haw the Ct scan showed tiny stable lung nodules, no evidence of progressive cancer. Mrs./ Mr. gave verbal understanding and had no further questions or concerns.

## 2022-02-20 ENCOUNTER — Encounter: Payer: Self-pay | Admitting: Oncology

## 2022-02-20 ENCOUNTER — Inpatient Hospital Stay: Payer: 59

## 2022-02-20 ENCOUNTER — Other Ambulatory Visit (HOSPITAL_BASED_OUTPATIENT_CLINIC_OR_DEPARTMENT_OTHER): Payer: Self-pay

## 2022-02-20 ENCOUNTER — Inpatient Hospital Stay: Payer: 59 | Attending: Oncology

## 2022-02-20 ENCOUNTER — Inpatient Hospital Stay (HOSPITAL_BASED_OUTPATIENT_CLINIC_OR_DEPARTMENT_OTHER): Payer: 59 | Admitting: Oncology

## 2022-02-20 VITALS — BP 156/90 | HR 90 | Temp 98.2°F | Resp 18 | Ht 73.0 in | Wt 206.0 lb

## 2022-02-20 DIAGNOSIS — Z9221 Personal history of antineoplastic chemotherapy: Secondary | ICD-10-CM | POA: Diagnosis not present

## 2022-02-20 DIAGNOSIS — R970 Elevated carcinoembryonic antigen [CEA]: Secondary | ICD-10-CM | POA: Diagnosis not present

## 2022-02-20 DIAGNOSIS — I5032 Chronic diastolic (congestive) heart failure: Secondary | ICD-10-CM | POA: Diagnosis not present

## 2022-02-20 DIAGNOSIS — Z85038 Personal history of other malignant neoplasm of large intestine: Secondary | ICD-10-CM | POA: Diagnosis present

## 2022-02-20 DIAGNOSIS — G620 Drug-induced polyneuropathy: Secondary | ICD-10-CM | POA: Insufficient documentation

## 2022-02-20 DIAGNOSIS — C189 Malignant neoplasm of colon, unspecified: Secondary | ICD-10-CM

## 2022-02-20 DIAGNOSIS — N289 Disorder of kidney and ureter, unspecified: Secondary | ICD-10-CM | POA: Insufficient documentation

## 2022-02-20 DIAGNOSIS — I110 Hypertensive heart disease with heart failure: Secondary | ICD-10-CM | POA: Diagnosis not present

## 2022-02-20 DIAGNOSIS — R5381 Other malaise: Secondary | ICD-10-CM | POA: Diagnosis not present

## 2022-02-20 DIAGNOSIS — T451X5D Adverse effect of antineoplastic and immunosuppressive drugs, subsequent encounter: Secondary | ICD-10-CM | POA: Diagnosis not present

## 2022-02-20 DIAGNOSIS — R630 Anorexia: Secondary | ICD-10-CM | POA: Insufficient documentation

## 2022-02-20 LAB — CEA (ACCESS): CEA (CHCC): 102.45 ng/mL — ABNORMAL HIGH (ref 0.00–5.00)

## 2022-02-20 MED ORDER — SODIUM CHLORIDE 0.9% FLUSH
10.0000 mL | INTRAVENOUS | Status: DC | PRN
  Administered 2022-02-20: 10 mL

## 2022-02-20 MED ORDER — HEPARIN SOD (PORK) LOCK FLUSH 100 UNIT/ML IV SOLN
500.0000 [IU] | Freq: Once | INTRAVENOUS | Status: AC | PRN
  Administered 2022-02-20: 500 [IU]

## 2022-02-20 MED ORDER — INFLUENZA VAC SPLIT QUAD 0.5 ML IM SUSY
PREFILLED_SYRINGE | INTRAMUSCULAR | 0 refills | Status: DC
  Filled 2022-02-20: qty 0.5, 1d supply, fill #0

## 2022-02-20 NOTE — Progress Notes (Signed)
[facility] OFFICE PROGRESS NOTE   Diagnosis: Colon cancer  INTERVAL HISTORY:   Jeffrey Young returns as scheduled.  He has intermittent anorexia and malaise.  He has persistent numbness in the hands greater than the feet.  He has mild balance difficulty.  Objective:  Vital signs in last 24 hours:  Blood pressure (!) 156/90, pulse 90, temperature 98.2 F (36.8 C), temperature source Oral, resp. rate 18, height $RemoveBe'6\' 1"'wUfoxtCtW$  (1.854 m), weight 206 lb (93.4 kg), SpO2 100 %.     Lymphatics: No cervical, supraclavicular, axillary, or inguinal nodes Resp: Lungs clear bilaterally Cardio: Regular rate and rhythm GI: Nontender, no hepatosplenomegaly Vascular: Trace edema at the left greater than right lower leg    Portacath/PICC-without erythema  Lab Results:  Lab Results  Component Value Date   WBC 6.2 04/13/2021   HGB 8.3 (L) 04/13/2021   HCT 25.4 (L) 04/13/2021   MCV 97.3 04/13/2021   PLT 190 04/13/2021   NEUTROABS 12.7 (H) 04/01/2021    CMP  Lab Results  Component Value Date   NA 133 (L) 11/07/2021   K 4.3 11/07/2021   CL 99 11/07/2021   CO2 26 11/07/2021   GLUCOSE 383 (H) 11/07/2021   BUN 36 (H) 11/07/2021   CREATININE 2.83 (H) 11/07/2021   CALCIUM 9.0 11/07/2021   PROT 6.2 (L) 04/03/2021   ALBUMIN 2.6 (L) 04/03/2021   AST 17 04/03/2021   ALT 9 04/03/2021   ALKPHOS 113 04/03/2021   BILITOT 1.9 (H) 04/03/2021   GFRNONAA 25 (L) 11/07/2021   GFRAA >60 01/26/2018    Lab Results  Component Value Date   CEA1 11.44 (H) 09/27/2020   CEA 102.45 (H) 02/20/2022    Medications: I have reviewed the patient's current medications.   Assessment/Plan: Descending colon cancer, stage IIIc (T3N2b M0), status post a partial left colectomy 07/30/2020, 9/16 lymph nodes positive, lymphovascular invasion, 1 satellite nodule, negative margins, MSS, no loss of mismatch repair protein expression -History of large polyp in the left side of the colon-referred to [facility] in  05/2018 for procedure canceled secondary to COVID-19 pandemic.  Procedure was not rescheduled. -CT chest/abdomen/pelvis with contrast 07/27/2020-3 small pulmonary nodules less than 5 mm favored to be benign, circumferential luminal narrowing of the distal transverse colon concerning for malignancy, no metastatic adenopathy in the mesentery porta hepatis, no for metastasis. -CEA on 07/27/2020 was 17.3; 37 on 08/30/2020; 33 on 09/13/2020 -Colonoscopy performed 07/27/2020 showed a fungating, infiltrative and ulcerated nonobstructing large mass in the proximal descending colon.  Biopsy-adenocarcinoma -Cycle 1 FOLFOX 08/30/2020 -Cycle 2 FOLFOX 09/13/2020, Emend added for delayed nausea -Cycle 3 FOLFOX 09/27/2020 -Cycle 4 FOLFOX 10/11/2020 -Cycle 5 FOLFOX 11/08/2020 -Cycle 6 FOLFOX 11/23/2020 -CT 12/03/2020-prior 3 mm left apical nodule no longer seen, no new/suspicious pulmonary nodules, no evidence of metastatic disease -Cycle 7 FOLFOX 12/06/2020 -Cycle 8 FOLFOX 12/21/2020 -Cycle 9 FOLFOX 01/03/2021 -Cycle 10 FOLFOX 01/17/2021 -Cycle 11 FOLFOX 01/31/2021, oxaliplatin held secondary to neuropathy symptoms -Cycle 12 FOLFOX 02/15/2021, oxaliplatin held secondary to neuropathy -CT abdomen/pelvis 04/02/2021-no evidence of recurrent colon cancer -CT chest 04/08/2021-no evidence of metastatic disease -Elevated CEA February and March 2023 -08/17/2021 PET scan-no evidence of local recurrence or metastasis -09/26/2021-Guardant-ctDNA detected -Colonoscopy 10/04/2021-negative -CT 11/17/2021-no evidence of metastatic disease -CTs 01/08/2022-no evidence of metastatic disease 2.  Anemia due to GI bleeding, iron deficiency?,  Renal insufficiency? 3.  New onset acute diastolic CHF March 3300 4.  Diabetes mellitus 5.  Renal insufficiency 6.  Hypertension 7.  History of left  transmetatarsal amputation 8.  History of colon polyps 9.  Neuropathy 10.  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  He has a history of colon cancer.  The CEA remains elevated, but is lower today.  CTs 01/16/2022 revealed no evidence of recurrent disease.  He has oxaliplatin neuropathy.  I cautioned him to be careful with ambulation.  He understands the elevated CEA likely reflects subclinical evidence of recurrent colon cancer.  He will return for an office visit and CEA in 6 weeks.  Jeffrey Coder, MD  02/20/2022  9:56 AM

## 2022-02-21 ENCOUNTER — Ambulatory Visit (INDEPENDENT_AMBULATORY_CARE_PROVIDER_SITE_OTHER): Payer: 59 | Admitting: Orthopedic Surgery

## 2022-02-21 DIAGNOSIS — L97521 Non-pressure chronic ulcer of other part of left foot limited to breakdown of skin: Secondary | ICD-10-CM | POA: Diagnosis not present

## 2022-02-21 DIAGNOSIS — Z89432 Acquired absence of left foot: Secondary | ICD-10-CM

## 2022-02-28 ENCOUNTER — Encounter: Payer: Self-pay | Admitting: Orthopedic Surgery

## 2022-02-28 NOTE — Progress Notes (Signed)
Office Visit Note   Patient: Jeffrey Young           Date of Birth: [birth year]           MRN: [id number] Visit Date: 02/21/2022              Requested by: [name], MD [place],  [place] [address] PCP: [name], MD  Chief Complaint  Patient presents with   Left Foot - Wound Check    Hx left transmet amputation      HPI: Patient is a 56 year old gentleman who presents with a Wagner grade 1 ulcer left transmetatarsal amputation.  He is wearing protective shoe wear.  Assessment & Plan: Visit Diagnoses:  1. History of transmetatarsal amputation of left foot ([place])   2. Non-pressure chronic ulcer of other part of left foot limited to breakdown of skin ([place])     Plan: Ulcer was debrided back to healthy viable tissue plan for follow-up in 4 weeks  Follow-Up Instructions: Return in about 4 weeks (around 03/21/2022).   Ortho Exam  Patient is alert, oriented, no adenopathy, well-dressed, normal affect, normal respiratory effort. Examination patient has a Wagner grade 1 ulcer on the plantar aspect transmetatarsal amputation left foot.  After informed consent a 10 blade knife was used to debride the skin and soft tissue back to healthy viable granulation tissue the ulcer is 3 cm in diameter after debridement and 2 mm deep.  There is healthy granulation tissue no undermining no tunneling no cellulitis.  Imaging: No results found. No images are attached to the encounter.  Labs: Lab Results  Component Value Date   HGBA1C 11.4 (H) 04/02/2021   HGBA1C 6.7 (H) 07/29/2020   HGBA1C 7.2 (H) 07/25/2020   ESRSEDRATE 121 (H) 01/23/2018   CRP 21.4 (H) 01/23/2018   REPTSTATUS 04/03/2021 FINAL 04/01/2021   GRAMSTAIN  01/22/2018    RARE WBC PRESENT, PREDOMINANTLY PMN MODERATE GRAM POSITIVE COCCI MODERATE GRAM NEGATIVE RODS FEW GRAM POSITIVE RODS Performed at [facility], [place] [address], [place], [place] [address]    CULT  04/01/2021    NO  GROWTH Performed at [facility] [address], [place], [place] [address]    LABORGA PROTEUS MIRABILIS 01/22/2018     Lab Results  Component Value Date   ALBUMIN 2.6 (L) 04/03/2021   ALBUMIN 3.4 (L) 04/01/2021   ALBUMIN 3.4 (L) 02/28/2021   PREALBUMIN 6.2 (L) 01/23/2018    Lab Results  Component Value Date   MG 1.8 04/13/2021   MG 1.8 04/12/2021   MG 1.8 04/11/2021   No results found for: "VD25OH"  Lab Results  Component Value Date   PREALBUMIN 6.2 (L) 01/23/2018      Latest Ref Rng & Units 04/13/2021    3:45 AM 04/12/2021    4:25 AM 04/11/2021    3:00 AM  CBC EXTENDED  WBC 4.0 - 10.5 K/uL 6.2  6.0  6.6   RBC 4.22 - 5.81 MIL/uL 2.61  2.75  2.73   Hemoglobin 13.0 - 17.0 g/dL 8.3  8.8  8.7   HCT 39.0 - 52.0 % 25.4  26.9  26.5   Platelets 150 - 400 K/uL 190  209  208      There is no height or weight on file to calculate BMI.  Orders:  No orders of the defined types were placed in this encounter.  No orders of the defined types were placed in this encounter.  Procedures: No procedures performed  Clinical Data: No additional findings.  ROS:  All other systems negative, except as noted in the HPI. Review of Systems  Objective: Vital Signs: There were no vitals taken for this visit.  Specialty Comments:  No specialty comments available.  PMFS History: Patient Active Problem List   Diagnosis Date Noted   Malignant neoplasm of colon ([place])    GI bleed 04/02/2021   Severe sepsis ([place]) 04/02/2021   AKI (acute kidney injury) ([place]) 99/83/3825   Acute metabolic encephalopathy 05/39/7673   Hypokalemia 04/02/2021   Encephalopathy    Nausea and vomiting    Coffee ground emesis    Elevated bilirubin    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 07/31/2020   IDA (iron deficiency anemia) from bleeding colon cancer 07/31/2020   Diabetic retinopathy ([place]) 07/31/2020   Hyperlipidemia 07/31/2020   Low back pain 07/31/2020   Shortness  of breath 41/93/7902   Systolic heart failure ([place]) 07/31/2020   CKD (chronic kidney disease) stage 3, GFR 30-59 ml/min ([place]) 07/31/2020   Insulin-requiring or dependent type II diabetes mellitus ([place]) 07/31/2020   ARF (acute renal failure) ([place]) 07/25/2020   Symptomatic anemia    New onset of congestive heart failure ([place]) 07/24/2020   GIB (gastrointestinal bleeding) 07/24/2020   Hypertension associated with diabetes ([place]) 06/07/2019   Erectile dysfunction associated with type 2 diabetes mellitus ([place]) 05/08/2019   Sepsis ([place]) 01/22/2018   Diabetic ulcer of left foot ([place]) 01/22/2018   Status post transmetatarsal amputation of left foot ([place]) 01/22/2018   Diabetic mononeuropathy associated with type 2 diabetes mellitus ([place] [employer]) 03/21/2017   Microalbuminuria due to type 2 diabetes mellitus ([place]) 03/21/2017   Vitamin D deficiency 04/06/2009   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 Metatarsalgia  10/1 IMO update   Erectile dysfunction associated with type 2 diabetes mellitus ([place]) 05/08/2019   Hyperlipidemia 07/31/2020   Hypertension associated with diabetes ([place]) 06/07/2019   Microalbuminuria due to type 2 diabetes mellitus ([place]) 03/21/2017   Necrotizing fasciitis of ankle and foot ([place]) 01/22/2018   Necrotizing soft tissue infection    Status post transmetatarsal amputation of left foot ([place]) 4/0/9735   Systolic heart failure ([place]) 07/31/2020   Uncontrolled type 2 diabetes mellitus with both eyes affected  by severe nonproliferative retinopathy and macular edema, with long-term current use of insulin 04/02/2009   Formatting of this note might be different from the original. Type 2 Diabetes Mellitus - Uncomplicated, Uncontrolled    Family History  Problem Relation Age of Onset   Hypertension Father     Past Surgical History:  Procedure Laterality Date   AMPUTATION Left 01/22/2018   Procedure: TRANSMETATARSAL AMPUTATION;  Surgeon: Newt Minion, MD;  Location: [facility];  Service: Orthopedics;  Laterality: Left;toes   BIOPSY  07/27/2020   Procedure: BIOPSY;  Surgeon: Carol Ada, MD;  [facility];  Service: Endoscopy;;   COLON RESECTION N/A 07/30/2020   Procedure: HAND ASSISTED LAPAROSCOPIC LEFT HEMI COLECTOMY;  Surgeon: Johnathan Hausen, MD;  [facility];  Service: General;  Laterality: N/A;   COLONOSCOPY WITH PROPOFOL N/A 05/24/2018   Procedure: COLONOSCOPY WITH PROPOFOL;  Surgeon: Carol Ada, MD;  [facility];  Service: Endoscopy;  Laterality: N/A;   COLONOSCOPY WITH PROPOFOL N/A 07/27/2020   Procedure: COLONOSCOPY WITH PROPOFOL;  Surgeon: Carol Ada, MD;  [facility];  Service: Endoscopy;  Laterality: N/A;   ESOPHAGOGASTRODUODENOSCOPY Left 04/05/2021   Procedure: ESOPHAGOGASTRODUODENOSCOPY (EGD);  Surgeon: Carol Ada, MD;  [facility];  Service: Endoscopy;  Laterality: Left;   POLYPECTOMY  05/24/2018   Procedure: POLYPECTOMY;  Surgeon: Carol Ada, MD;  [facility];  Service: Endoscopy;;   PORTACATH PLACEMENT Left 08/24/2020   Procedure: INSERTION PORT-A-CATH;  Surgeon: Johnathan Hausen, MD;  [facility];  Service: General;  Laterality: Left;  75/rm1   SUBMUCOSAL TATTOO INJECTION  07/27/2020   Procedure: SUBMUCOSAL TATTOO INJECTION;  Surgeon: Carol Ada, MD;  [facility];  Service: Endoscopy;;   Social History   Occupational History   Not on file  Tobacco Use   Smoking status: Never   Smokeless tobacco: Never   Vaping Use   Vaping Use: Never used  Substance and Sexual Activity   Alcohol use: Yes    Alcohol/week: 1.0 standard drink of alcohol    Types: 1 Cans of beer per week    Comment: occasional   Drug use: Never   Sexual activity: Yes

## 2022-03-17 ENCOUNTER — Encounter: Payer: Self-pay | Admitting: *Deleted

## 2022-03-17 NOTE — Progress Notes (Signed)
Received request for medical records for 05/22/2021 to present. Emailed request to HIM.

## 2022-03-21 ENCOUNTER — Ambulatory Visit (INDEPENDENT_AMBULATORY_CARE_PROVIDER_SITE_OTHER): Payer: 59 | Admitting: Orthopedic Surgery

## 2022-03-21 DIAGNOSIS — L97521 Non-pressure chronic ulcer of other part of left foot limited to breakdown of skin: Secondary | ICD-10-CM

## 2022-03-21 DIAGNOSIS — Z89432 Acquired absence of left foot: Secondary | ICD-10-CM | POA: Diagnosis not present

## 2022-04-03 ENCOUNTER — Encounter: Payer: Self-pay | Admitting: Nurse Practitioner

## 2022-04-03 ENCOUNTER — Inpatient Hospital Stay (HOSPITAL_BASED_OUTPATIENT_CLINIC_OR_DEPARTMENT_OTHER): Payer: 59 | Admitting: Nurse Practitioner

## 2022-04-03 ENCOUNTER — Inpatient Hospital Stay: Payer: 59

## 2022-04-03 ENCOUNTER — Inpatient Hospital Stay: Payer: 59 | Attending: Oncology

## 2022-04-03 VITALS — BP 159/95 | HR 86 | Temp 98.2°F | Resp 18 | Ht 73.0 in | Wt 203.0 lb

## 2022-04-03 DIAGNOSIS — D649 Anemia, unspecified: Secondary | ICD-10-CM | POA: Insufficient documentation

## 2022-04-03 DIAGNOSIS — T451X5D Adverse effect of antineoplastic and immunosuppressive drugs, subsequent encounter: Secondary | ICD-10-CM | POA: Diagnosis not present

## 2022-04-03 DIAGNOSIS — R110 Nausea: Secondary | ICD-10-CM | POA: Diagnosis not present

## 2022-04-03 DIAGNOSIS — C189 Malignant neoplasm of colon, unspecified: Secondary | ICD-10-CM

## 2022-04-03 DIAGNOSIS — Z8719 Personal history of other diseases of the digestive system: Secondary | ICD-10-CM | POA: Insufficient documentation

## 2022-04-03 DIAGNOSIS — Z85038 Personal history of other malignant neoplasm of large intestine: Secondary | ICD-10-CM | POA: Diagnosis present

## 2022-04-03 DIAGNOSIS — I5032 Chronic diastolic (congestive) heart failure: Secondary | ICD-10-CM | POA: Insufficient documentation

## 2022-04-03 DIAGNOSIS — R970 Elevated carcinoembryonic antigen [CEA]: Secondary | ICD-10-CM | POA: Diagnosis not present

## 2022-04-03 DIAGNOSIS — E119 Type 2 diabetes mellitus without complications: Secondary | ICD-10-CM | POA: Insufficient documentation

## 2022-04-03 DIAGNOSIS — G620 Drug-induced polyneuropathy: Secondary | ICD-10-CM | POA: Insufficient documentation

## 2022-04-03 DIAGNOSIS — N289 Disorder of kidney and ureter, unspecified: Secondary | ICD-10-CM | POA: Insufficient documentation

## 2022-04-03 DIAGNOSIS — I110 Hypertensive heart disease with heart failure: Secondary | ICD-10-CM | POA: Insufficient documentation

## 2022-04-03 LAB — CEA (ACCESS): CEA (CHCC): 148.12 ng/mL — ABNORMAL HIGH (ref 0.00–5.00)

## 2022-04-03 LAB — BASIC METABOLIC PANEL - CANCER CENTER ONLY
Anion gap: 7 (ref 5–15)
BUN: 38 mg/dL — ABNORMAL HIGH (ref 6–20)
CO2: 24 mmol/L (ref 22–32)
Calcium: 8.4 mg/dL — ABNORMAL LOW (ref 8.9–10.3)
Chloride: 98 mmol/L (ref 98–111)
Creatinine: 3.01 mg/dL (ref 0.61–1.24)
GFR, Estimated: 23 mL/min — ABNORMAL LOW (ref >60)
Glucose, Bld: 405 mg/dL — ABNORMAL HIGH (ref 70–99)
Potassium: 3.7 mmol/L (ref 3.5–5.1)
Sodium: 129 mmol/L — ABNORMAL LOW (ref 135–145)

## 2022-04-03 NOTE — Progress Notes (Signed)
[name] OFFICE PROGRESS NOTE   Diagnosis: Colon cancer  INTERVAL HISTORY:   Jeffrey Young returns as scheduled.  Appetite varies.  He has periodic episodes of nausea.  No diarrhea.  No abdominal pain.  Persistent neuropathy symptoms hands and feet.  He has tingling and cold sensitivity.  Balance is affected.  Objective:  Vital signs in last 24 hours:  Blood pressure (!) 159/95, pulse 86, temperature 98.2 F (36.8 C), resp. rate 18, height _0  (1.854 m), weight 203 lb (92.1 kg), SpO2 98 %.    HEENT: Mild white coating over tongue.  No ulcers. Lymphatics: No palpable cervical, supraclavicular, axillary or inguinal lymph nodes. Resp: Lungs clear bilaterally. Cardio: Regular rate and rhythm. GI: No hepatosplenomegaly.  No mass. Vascular: No significant leg edema. Neuro: Alert and oriented. Port-A-Cath without erythema.  Lab Results:  Lab Results  Component Value Date   WBC 6.2 04/13/2021   HGB 8.3 (L) 04/13/2021   HCT 25.4 (L) 04/13/2021   MCV 97.3 04/13/2021   PLT 190 04/13/2021   NEUTROABS 12.7 (H) 04/01/2021    Imaging:  No results found.  Medications: I have reviewed the patient's current medications.  Assessment/Plan: Descending colon cancer, stage IIIc (T3N2b M0), status post a partial left colectomy 07/30/2020, 9/16 lymph nodes positive, lymphovascular invasion, 1 satellite nodule, negative margins, MSS, no loss of mismatch repair protein expression -History of large polyp in the left side of the colon-referred to [facility] in 05/2018 for procedure canceled secondary to COVID-19 pandemic.  Procedure was not rescheduled. -CT chest/abdomen/pelvis with contrast 07/27/2020-3 small pulmonary nodules less than 5 mm favored to be benign, circumferential luminal narrowing of the distal transverse colon concerning for malignancy, no metastatic adenopathy in the mesentery porta hepatis, no for metastasis. -CEA on 07/27/2020 was 17.3; 37 on 08/30/2020; 33 on  09/13/2020 -Colonoscopy performed 07/27/2020 showed a fungating, infiltrative and ulcerated nonobstructing large mass in the proximal descending colon.  Biopsy-adenocarcinoma -Cycle 1 FOLFOX 08/30/2020 -Cycle 2 FOLFOX 09/13/2020, Emend added for delayed nausea -Cycle 3 FOLFOX 09/27/2020 -Cycle 4 FOLFOX 10/11/2020 -Cycle 5 FOLFOX 11/08/2020 -Cycle 6 FOLFOX 11/23/2020 -CT 12/03/2020-prior 3 mm left apical nodule no longer seen, no new/suspicious pulmonary nodules, no evidence of metastatic disease -Cycle 7 FOLFOX 12/06/2020 -Cycle 8 FOLFOX 12/21/2020 -Cycle 9 FOLFOX 01/03/2021 -Cycle 10 FOLFOX 01/17/2021 -Cycle 11 FOLFOX 01/31/2021, oxaliplatin held secondary to neuropathy symptoms -Cycle 12 FOLFOX 02/15/2021, oxaliplatin held secondary to neuropathy -CT abdomen/pelvis 04/02/2021-no evidence of recurrent colon cancer -CT chest 04/08/2021-no evidence of metastatic disease -Elevated CEA February and March 2023 -08/17/2021 PET scan-no evidence of local recurrence or metastasis -09/26/2021-Guardant-ctDNA detected -Colonoscopy 10/04/2021-negative -CT 11/17/2021-no evidence of metastatic disease -CTs 01/08/2022-no evidence of metastatic disease 2.  Anemia due to GI bleeding, iron deficiency?,  Renal insufficiency? 3.  New onset acute diastolic CHF March 9179 4.  Diabetes mellitus 5.  Renal insufficiency 6.  Hypertension 7.  History of left transmetatarsal amputation 8.  History of colon polyps 9.  Neuropathy 10.  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  The CEA from today is higher than most recent previous, stable compared to approximately 3 months ago.  CTs late August showed no evidence of recurrent disease.  He has persistent neuropathy symptoms related to oxaliplatin.  The basic metabolic panel from today shows creatinine is higher.  He is not sure if he has a  nephrologist.  If he does he will schedule an appointment.  We are forwarding a copy of today's labs to his PCP.  He will return for a CEA and follow-up visit in 6 weeks.  He will contact the office in the interim with any problems.    Ned Card ANP/GNP-BC   04/03/2022  11:27 AM

## 2022-04-03 NOTE — Progress Notes (Signed)
CRITICAL VALUE STICKER  CRITICAL VALUE:Creatinine 3.01  RECEIVER (on-site recipient of call):Dilyn Osoria,RN  DATE & TIME NOTIFIED: 04/03/22 @ 1134  MESSENGER (representative from lab):Otila Kluver  MD NOTIFIED: Ned Card, NP  TIME OF NOTIFICATION:1137  RESPONSE: Seeing patient in office.

## 2022-04-12 ENCOUNTER — Encounter: Payer: Self-pay | Admitting: *Deleted

## 2022-04-12 NOTE — Progress Notes (Signed)
Received request for records from [facility]. Request was emailed to HIM department.

## 2022-04-18 ENCOUNTER — Encounter: Payer: Self-pay | Admitting: Orthopedic Surgery

## 2022-04-18 ENCOUNTER — Ambulatory Visit (INDEPENDENT_AMBULATORY_CARE_PROVIDER_SITE_OTHER): Payer: 59 | Admitting: Orthopedic Surgery

## 2022-04-18 DIAGNOSIS — L97521 Non-pressure chronic ulcer of other part of left foot limited to breakdown of skin: Secondary | ICD-10-CM | POA: Diagnosis not present

## 2022-04-18 DIAGNOSIS — Z89432 Acquired absence of left foot: Secondary | ICD-10-CM

## 2022-04-18 NOTE — Progress Notes (Signed)
Office Visit Note   Patient: Jeffrey Young           Date of Birth: 05-03-1966           MRN: [id number] Visit Date: 04/18/2022              Requested by: [name], MD [address] [place] [place],  [place] [address] PCP: [name], MD  Chief Complaint  Patient presents with   Left Foot - Follow-up    Hx transmet amputation 2019      HPI: Patient is a 56 year old gentleman with a Wagner grade 1 ulcer left foot status post transmetatarsal amputation.  Assessment & Plan: Visit Diagnoses:  1. History of transmetatarsal amputation of left foot ([place])     Plan: Ulcer was debrided back to healthy viable tissue.  No signs of infection.  Follow-Up Instructions: No follow-ups on file.   Ortho Exam  Patient is alert, oriented, no adenopathy, well-dressed, normal affect, normal respiratory effort. Examination patient has a palpable dorsalis pedis pulse.  There is no redness or cellulitis no ischemic changes.  Patient has a Wagner grade 1 ulcer on the plantar aspect of the left foot.  After informed consent a 10 blade knife was used to debride the skin and soft tissue back to healthy viable granulation tissue.  This was touched with silver nitrate a Band-Aid was applied.  The ulcer was 3 cm in diameter after debridement 3 mm deep.    Imaging: No results found. No images are attached to the encounter.  Labs: Lab Results  Component Value Date   HGBA1C 11.4 (H) 04/02/2021   HGBA1C 6.7 (H) 07/29/2020   HGBA1C 7.2 (H) 07/25/2020   ESRSEDRATE 121 (H) 01/23/2018   CRP 21.4 (H) 01/23/2018   REPTSTATUS 04/03/2021 FINAL 04/01/2021   GRAMSTAIN  01/22/2018    RARE WBC PRESENT, PREDOMINANTLY PMN MODERATE GRAM POSITIVE COCCI MODERATE GRAM NEGATIVE RODS FEW GRAM POSITIVE RODS Performed at [facility], [place] [address] [place], [place] [address]    CULT  04/01/2021    NO GROWTH Performed at [facility] [address] [place], [place] [address]     LABORGA PROTEUS MIRABILIS 01/22/2018     Lab Results  Component Value Date   ALBUMIN 2.6 (L) 04/03/2021   ALBUMIN 3.4 (L) 04/01/2021   ALBUMIN 3.4 (L) 02/28/2021   PREALBUMIN 6.2 (L) 01/23/2018    Lab Results  Component Value Date   MG 1.8 04/13/2021   MG 1.8 04/12/2021   MG 1.8 04/11/2021   No results found for: "VD25OH"  Lab Results  Component Value Date   PREALBUMIN 6.2 (L) 01/23/2018      Latest Ref Rng & Units 04/13/2021    3:45 AM 04/12/2021    4:25 AM 04/11/2021    3:00 AM  CBC EXTENDED  WBC 4.0 - 10.5 K/uL 6.2  6.0  6.6   RBC 4.22 - 5.81 MIL/uL 2.61  2.75  2.73   Hemoglobin 13.0 - 17.0 g/dL 8.3  8.8  8.7   HCT 39.0 - 52.0 % 25.4  26.9  26.5   Platelets 150 - 400 K/uL 190  209  208      There is no height or weight on file to calculate BMI.  Orders:  No orders of the defined types were placed in this encounter.  No orders of the defined types were placed in this encounter.    Procedures: No procedures performed  Clinical Data:  No additional findings.  ROS:  All other systems negative, except as noted in the HPI. Review of Systems  Objective: Vital Signs: There were no vitals taken for this visit.  Specialty Comments:  No specialty comments available.  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 07/31/2020   IDA (iron deficiency anemia) from bleeding colon cancer 07/31/2020   Diabetic retinopathy ([place]) 07/31/2020   Hyperlipidemia 07/31/2020   Low back pain 07/31/2020   Shortness of breath 50/93/2671   Systolic heart failure ([place]) 07/31/2020   CKD (chronic kidney  disease) stage 3, GFR 30-59 ml/min ([place]) 07/31/2020   Insulin-requiring or dependent type II diabetes mellitus ([place]) 07/31/2020   ARF (acute renal failure) ([place]) 07/25/2020   Symptomatic anemia    New onset of congestive heart failure ([place]) 07/24/2020   GIB (gastrointestinal bleeding) 07/24/2020   Hypertension associated with diabetes ([place]) 06/07/2019   Erectile dysfunction associated with type 2 diabetes mellitus ([place]) 05/08/2019   Sepsis ([place]) 01/22/2018   Diabetic ulcer of left foot ([place]) 01/22/2018   Status post transmetatarsal amputation of left foot ([place]) 01/22/2018   Diabetic mononeuropathy associated with type 2 diabetes mellitus ([place]) 03/21/2017   Microalbuminuria due to type 2 diabetes mellitus ([place]) 03/21/2017   Vitamin D deficiency 04/06/2009   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 Type 2 Diabetes Mellitus - Uncomplicated, Uncontrolled    Family History  Problem Relation Age of Onset   Hypertension Father     Past Surgical History:  Procedure Laterality Date   AMPUTATION Left 01/22/2018   Procedure: TRANSMETATARSAL AMPUTATION;  Surgeon: Newt Minion, MD;  Location: [facility];  Service: Orthopedics;  Laterality: Left;toes   BIOPSY  07/27/2020   Procedure: BIOPSY;  Surgeon: Carol Ada, MD;  [facility];  Service: Endoscopy;;   COLON RESECTION N/A 07/30/2020   Procedure: HAND ASSISTED LAPAROSCOPIC LEFT HEMI COLECTOMY;  Surgeon: Johnathan Hausen, MD;  [facility];  Service: General;  Laterality: N/A;   COLONOSCOPY WITH PROPOFOL N/A 05/24/2018   Procedure: COLONOSCOPY WITH PROPOFOL;  Surgeon: Carol Ada, MD;  [facility];  Service: Endoscopy;  Laterality: N/A;   COLONOSCOPY WITH PROPOFOL N/A 07/27/2020   Procedure: COLONOSCOPY WITH PROPOFOL;  Surgeon: Carol Ada, MD;  [facility];  Service: Endoscopy;  Laterality: N/A;   ESOPHAGOGASTRODUODENOSCOPY Left 04/05/2021   Procedure: ESOPHAGOGASTRODUODENOSCOPY (EGD);  Surgeon: Carol Ada, MD;  [facility];  Service: Endoscopy;  Laterality: Left;   POLYPECTOMY  05/24/2018   Procedure: POLYPECTOMY;  Surgeon: Carol Ada, MD;  [facility];  Service: Endoscopy;;   PORTACATH PLACEMENT Left 08/24/2020   Procedure: INSERTION PORT-A-CATH;  Surgeon: Johnathan Hausen, MD;  [facility];  Service: General;  Laterality: Left;  75/rm1   SUBMUCOSAL TATTOO INJECTION  07/27/2020   Procedure: SUBMUCOSAL TATTOO INJECTION;  Surgeon: Carol Ada, MD;  [facility];  Service: Endoscopy;;   Social History   Occupational History   Not on file  Tobacco Use   Smoking status: Never   Smokeless tobacco: Never  Vaping Use   Vaping Use: Never used  Substance and Sexual Activity   Alcohol use: Yes     Alcohol/week: 1.0 standard drink of alcohol    Types: 1 Cans of beer per week    Comment: occasional   Drug use: Never   Sexual activity: Yes

## 2022-04-18 NOTE — Progress Notes (Signed)
Office Visit Note   Patient: Jeffrey Young           Date of Birth: [birth year]           MRN: [id number] Visit Date: 03/21/2022              Requested by: [name], MD [address] [place],  [place] [address] PCP: [name], MD  Chief Complaint  Patient presents with   Left Foot - Follow-up    Hx transmet amp 01/2018 f/u wound       HPI: Patient is a 56 year old gentleman who presents in follow-up for Wagner grade 1 ulcer left transmetatarsal amputation.  Assessment & Plan: Visit Diagnoses:  1. History of transmetatarsal amputation of left foot ([place])     Plan: Ulcer was debrided continue protective shoe wear.  Follow-Up Instructions: Return in about 4 weeks (around 04/18/2022).   Ortho Exam  Patient is alert, oriented, no adenopathy, well-dressed, normal affect, normal respiratory effort. Patient is alert, oriented, no adenopathy, well-dressed, normal affect, normal respiratory effort. Examination patient has a Wagner grade 1 ulcer on the plantar aspect transmetatarsal amputation left foot.  After informed consent a 10 blade knife was used to debride the skin and soft tissue back to healthy viable granulation tissue the ulcer is 3 cm in diameter after debridement and 2 mm deep.  There is healthy granulation tissue no undermining no tunneling no cellulitis.  Imaging: No results found. No images are attached to the encounter.  Labs: Lab Results  Component Value Date   HGBA1C 11.4 (H) 04/02/2021   HGBA1C 6.7 (H) 07/29/2020   HGBA1C 7.2 (H) 07/25/2020   ESRSEDRATE 121 (H) 01/23/2018   CRP 21.4 (H) 01/23/2018   REPTSTATUS 04/03/2021 FINAL 04/01/2021   GRAMSTAIN  01/22/2018    RARE WBC PRESENT, PREDOMINANTLY PMN MODERATE GRAM POSITIVE COCCI MODERATE GRAM NEGATIVE RODS FEW GRAM POSITIVE RODS Performed at [facility], [place] [address], [place], [place] [address]    CULT  04/01/2021    NO GROWTH Performed at [facility] [address], [place], [place] [address]    LABORGA PROTEUS MIRABILIS 01/22/2018     Lab Results  Component Value Date   ALBUMIN 2.6 (L) 04/03/2021   ALBUMIN 3.4 (L) 04/01/2021   ALBUMIN 3.4 (L) 02/28/2021   PREALBUMIN 6.2 (L) 01/23/2018    Lab Results  Component Value Date   MG 1.8 04/13/2021   MG 1.8 04/12/2021   MG 1.8 04/11/2021   No results found for: "VD25OH"  Lab Results  Component Value Date   PREALBUMIN 6.2 (L) 01/23/2018      Latest Ref Rng & Units 04/13/2021    3:45 AM 04/12/2021    4:25 AM 04/11/2021    3:00 AM  CBC EXTENDED  WBC 4.0 - 10.5 K/uL 6.2  6.0  6.6   RBC 4.22 - 5.81 MIL/uL 2.61  2.75  2.73   Hemoglobin 13.0 - 17.0 g/dL 8.3  8.8  8.7   HCT 39.0 - 52.0 % 25.4  26.9  26.5   Platelets 150 - 400 K/uL 190  209  208      There is no height or weight on file to calculate BMI.  Orders:  No orders of the defined types were placed in this encounter.  No orders of the defined types were placed in this encounter.    Procedures: No procedures performed  Clinical Data: No additional findings.  ROS:  All  other systems negative, except as noted in the HPI. Review of Systems  Objective: Vital Signs: There were no vitals taken for this visit.  Specialty Comments:  No specialty comments available.  PMFS History: Patient Active Problem List   Diagnosis Date Noted   Malignant neoplasm of colon ([place])    GI bleed 04/02/2021   Severe sepsis ([place]) 04/02/2021   AKI (acute kidney injury) ([place]) 29/79/8921   Acute metabolic encephalopathy 19/41/7408   Hypokalemia 04/02/2021   Encephalopathy    Nausea and vomiting    Coffee ground emesis    Elevated bilirubin    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 07/31/2020   IDA (iron deficiency anemia) from bleeding colon cancer 07/31/2020   Diabetic retinopathy ([place]) 07/31/2020   Hyperlipidemia 07/31/2020   Low back pain 07/31/2020   Shortness of breath 14/48/1856   Systolic heart failure  ([place]) 07/31/2020   CKD (chronic kidney disease) stage 3, GFR 30-59 ml/min ([place]) 07/31/2020   Insulin-requiring or dependent type II diabetes mellitus ([place]) 07/31/2020   ARF (acute renal failure) ([place]) 07/25/2020   Symptomatic anemia    New onset of congestive heart failure ([place]) 07/24/2020   GIB (gastrointestinal bleeding) 07/24/2020   Hypertension associated with diabetes ([place]) 06/07/2019   Erectile dysfunction associated with type 2 diabetes mellitus ([place]) 05/08/2019   Sepsis ([place]) 01/22/2018   Diabetic ulcer of left foot ([place]) 01/22/2018   Status post transmetatarsal amputation of left foot ([place]) 01/22/2018   Diabetic mononeuropathy associated with type 2 diabetes mellitus ([place]) 03/21/2017   Microalbuminuria due to type 2 diabetes mellitus ([place]) 03/21/2017   Vitamin D deficiency 04/06/2009   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 Metatarsalgia  10/1 IMO update   Erectile dysfunction associated with type 2 diabetes mellitus ([place]) 05/08/2019   Hyperlipidemia 07/31/2020   Hypertension associated with diabetes ([place]) 06/07/2019   Microalbuminuria due to type 2 diabetes mellitus ([place]) 03/21/2017   Necrotizing fasciitis of ankle and foot ([place]) 01/22/2018   Necrotizing soft tissue infection    Status post transmetatarsal amputation of left foot ([place]) 07/20/4968   Systolic heart failure ([place]) 07/31/2020   Uncontrolled type 2 diabetes mellitus with both eyes affected by severe nonproliferative retinopathy and  macular edema, with long-term current use of insulin 04/02/2009   Formatting of this note might be different from the original. Type 2 Diabetes Mellitus - Uncomplicated, Uncontrolled    Family History  Problem Relation Age of Onset   Hypertension Father     Past Surgical History:  Procedure Laterality Date   AMPUTATION Left 01/22/2018   Procedure: TRANSMETATARSAL AMPUTATION;  Surgeon: Newt Minion, MD;  Location: [facility];  Service: Orthopedics;  Laterality: Left;toes   BIOPSY  07/27/2020   Procedure: BIOPSY;  Surgeon: Carol Ada, MD;  [facility];  Service: Endoscopy;;   COLON RESECTION N/A 07/30/2020   Procedure: HAND ASSISTED LAPAROSCOPIC LEFT HEMI COLECTOMY;  Surgeon: Johnathan Hausen, MD;  [facility];  Service: General;  Laterality: N/A;   COLONOSCOPY WITH  PROPOFOL N/A 05/24/2018   Procedure: COLONOSCOPY WITH PROPOFOL;  Surgeon: Carol Ada, MD;  [facility];  Service: Endoscopy;  Laterality: N/A;   COLONOSCOPY WITH PROPOFOL N/A 07/27/2020   Procedure: COLONOSCOPY WITH PROPOFOL;  Surgeon: Carol Ada, MD;  [facility];  Service: Endoscopy;  Laterality: N/A;   ESOPHAGOGASTRODUODENOSCOPY Left 04/05/2021   Procedure: ESOPHAGOGASTRODUODENOSCOPY (EGD);  Surgeon: Carol Ada, MD;  [facility];  Service: Endoscopy;  Laterality: Left;   POLYPECTOMY  05/24/2018   Procedure: POLYPECTOMY;  Surgeon: Carol Ada, MD;  [facility];  Service: Endoscopy;;   PORTACATH PLACEMENT Left 08/24/2020   Procedure: INSERTION PORT-A-CATH;  Surgeon: Johnathan Hausen, MD;  [facility];  Service: General;  Laterality: Left;  75/rm1   SUBMUCOSAL TATTOO INJECTION  07/27/2020   Procedure: SUBMUCOSAL TATTOO INJECTION;  Surgeon: Carol Ada, MD;  [facility];  Service: Endoscopy;;   Social History   Occupational History   Not on file  Tobacco Use   Smoking status: Never   Smokeless tobacco: Never  Vaping Use   Vaping Use: Never used   Substance and Sexual Activity   Alcohol use: Yes    Alcohol/week: 1.0 standard drink of alcohol    Types: 1 Cans of beer per week    Comment: occasional   Drug use: Never   Sexual activity: Yes

## 2022-05-08 ENCOUNTER — Ambulatory Visit (INDEPENDENT_AMBULATORY_CARE_PROVIDER_SITE_OTHER): Payer: 59 | Admitting: Orthopedic Surgery

## 2022-05-08 DIAGNOSIS — L97521 Non-pressure chronic ulcer of other part of left foot limited to breakdown of skin: Secondary | ICD-10-CM

## 2022-05-08 DIAGNOSIS — Z89432 Acquired absence of left foot: Secondary | ICD-10-CM

## 2022-05-09 ENCOUNTER — Telehealth: Payer: Self-pay | Admitting: *Deleted

## 2022-05-09 ENCOUNTER — Encounter: Payer: Self-pay | Admitting: Orthopedic Surgery

## 2022-05-09 NOTE — Telephone Encounter (Addendum)
VM left by [employer], Marylene Land asking for last office note to be faxed and for MD to note what are his current work restrictions? Fax [phone] case ID [id number].

## 2022-05-09 NOTE — Progress Notes (Signed)
Office Visit Note   Patient: Jeffrey Young           Date of Birth: 25-Feb-1966           MRN: [id number] Visit Date: 05/08/2022              Requested by: [name], MD [address] [address] [place],  [place] [id number] PCP: [name], MD  Chief Complaint  Patient presents with   Left Foot - Wound Check    Hx transmet amputation      HPI: Patient is a 56 year old gentleman who is status post left transmetatarsal amputation with plantar ulcer.  Patient has new orthotics.  Assessment & Plan: Visit Diagnoses: No diagnosis found.  Plan: Continue current care with pressure offloading.  Follow-Up Instructions: No follow-ups on file.   Ortho Exam  Patient is alert, oriented, no adenopathy, well-dressed, normal affect, normal respiratory effort. Examination patient has a recurrent Wagner grade 1 ulcer plantar aspect left foot transmetatarsal amputation.  After informed consent a 10 blade knife was used to debride the skin and soft tissue back to healthy viable bleeding granulation tissue.  There is no exposed bone or tendon no tunneling.  The wound is 4 cm in diameter and 2 mm deep.  Silver nitrate was used for hemostasis.  Imaging: No results found. No images are attached to the encounter.  Labs: Lab Results  Component Value Date   HGBA1C 11.4 (H) 04/02/2021   HGBA1C 6.7 (H) 07/29/2020   HGBA1C 7.2 (H) 07/25/2020   ESRSEDRATE 121 (H) 01/23/2018   CRP 21.4 (H) 01/23/2018   REPTSTATUS 04/03/2021 FINAL 04/01/2021   GRAMSTAIN  01/22/2018    RARE WBC PRESENT, PREDOMINANTLY PMN MODERATE GRAM POSITIVE COCCI MODERATE GRAM NEGATIVE RODS FEW GRAM POSITIVE RODS Performed at [facility], [place] [address], [place], [place] [address]    CULT  04/01/2021    NO GROWTH Performed at [facility] [address], [place], [place] [address]    LABORGA PROTEUS MIRABILIS 01/22/2018     Lab Results  Component Value Date   ALBUMIN 2.6 (L) 04/03/2021    ALBUMIN 3.4 (L) 04/01/2021   ALBUMIN 3.4 (L) 02/28/2021   PREALBUMIN 6.2 (L) 01/23/2018    Lab Results  Component Value Date   MG 1.8 04/13/2021   MG 1.8 04/12/2021   MG 1.8 04/11/2021   No results found for: "VD25OH"  Lab Results  Component Value Date   PREALBUMIN 6.2 (L) 01/23/2018      Latest Ref Rng & Units 04/13/2021    3:45 AM 04/12/2021    4:25 AM 04/11/2021    3:00 AM  CBC EXTENDED  WBC 4.0 - 10.5 K/uL 6.2  6.0  6.6   RBC 4.22 - 5.81 MIL/uL 2.61  2.75  2.73   Hemoglobin 13.0 - 17.0 g/dL 8.3  8.8  8.7   HCT 39.0 - 52.0 % 25.4  26.9  26.5   Platelets 150 - 400 K/uL 190  209  208      There is no height or weight on file to calculate BMI.  Orders:  No orders of the defined types were placed in this encounter.  No orders of the defined types were placed in this encounter.    Procedures: No procedures performed  Clinical Data: No additional findings.  ROS:  All other systems negative, except as noted in the HPI. Review of Systems  Objective: Vital Signs: There were no vitals taken for  this visit.  Specialty Comments:  No specialty comments available.  PMFS History: Patient Active Problem List   Diagnosis Date Noted   Malignant neoplasm of colon ([place])    GI bleed 04/02/2021   Severe sepsis ([place]) 04/02/2021   AKI (acute kidney injury) ([place]) 80/99/8338   Acute metabolic encephalopathy 25/09/3974   Hypokalemia 04/02/2021   Encephalopathy    Nausea and vomiting    Coffee ground emesis    Elevated bilirubin    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 07/31/2020   IDA (iron deficiency anemia) from bleeding colon cancer 07/31/2020   Diabetic retinopathy ([place]) 07/31/2020   Hyperlipidemia 07/31/2020   Low back pain 07/31/2020   Shortness of breath 73/41/9379   Systolic heart failure ([place]) 07/31/2020   CKD (chronic kidney disease) stage 3, GFR 30-59 ml/min ([place]) 07/31/2020   Insulin-requiring or dependent type II diabetes mellitus  ([place]) 07/31/2020   ARF (acute renal failure) ([place]) 07/25/2020   Symptomatic anemia    New onset of congestive heart failure ([place]) 07/24/2020   GIB (gastrointestinal bleeding) 07/24/2020   Hypertension associated with diabetes ([place]) 06/07/2019   Erectile dysfunction associated with type 2 diabetes mellitus ([place]) 05/08/2019   Sepsis ([place]) 01/22/2018   Diabetic ulcer of left foot ([place]) 01/22/2018   Status post transmetatarsal amputation of left foot ([place]) 01/22/2018   Diabetic mononeuropathy associated with type 2 diabetes mellitus ([place]) 03/21/2017   Microalbuminuria due to type 2 diabetes mellitus ([place]) 03/21/2017   Vitamin D deficiency 04/06/2009   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 Type 2 Diabetes Mellitus -  Uncomplicated, Uncontrolled    Family History  Problem Relation Age of Onset   Hypertension Father     Past Surgical History:  Procedure Laterality Date   AMPUTATION Left 01/22/2018   Procedure: TRANSMETATARSAL AMPUTATION;  Surgeon: Newt Minion, MD;  Location: [facility];  Service: Orthopedics;  Laterality: Left;toes   BIOPSY  07/27/2020   Procedure: BIOPSY;  Surgeon: Carol Ada, MD;  [facility];  Service: Endoscopy;;   COLON RESECTION N/A 07/30/2020   Procedure: HAND ASSISTED LAPAROSCOPIC LEFT HEMI COLECTOMY;  Surgeon: Johnathan Hausen, MD;  [facility];  Service: General;  Laterality: N/A;   COLONOSCOPY WITH PROPOFOL N/A 05/24/2018   Procedure: COLONOSCOPY WITH PROPOFOL;  Surgeon: Carol Ada, MD;  [facility];  Service: Endoscopy;  Laterality: N/A;   COLONOSCOPY WITH PROPOFOL N/A 07/27/2020   Procedure: COLONOSCOPY WITH PROPOFOL;  Surgeon: Carol Ada, MD;  [facility];  Service: Endoscopy;  Laterality: N/A;   ESOPHAGOGASTRODUODENOSCOPY Left 04/05/2021   Procedure: ESOPHAGOGASTRODUODENOSCOPY (EGD);  Surgeon: Carol Ada, MD;  [facility];  Service: Endoscopy;  Laterality: Left;   POLYPECTOMY  05/24/2018   Procedure: POLYPECTOMY;  Surgeon: Carol Ada, MD;  [facility];  Service: Endoscopy;;   PORTACATH PLACEMENT Left 08/24/2020   Procedure: INSERTION PORT-A-CATH;  Surgeon: Johnathan Hausen, MD;  [facility];  Service: General;  Laterality: Left;  75/rm1   SUBMUCOSAL TATTOO INJECTION  07/27/2020   Procedure: SUBMUCOSAL TATTOO INJECTION;  Surgeon: Carol Ada, MD;  [facility];  Service: Endoscopy;;   Social History   Occupational History   Not on file  Tobacco Use   Smoking status: Never   Smokeless tobacco: Never  Vaping Use   Vaping Use: Never used  Substance and Sexual Activity   Alcohol use: Yes    Alcohol/week: 1.0 standard drink of alcohol    Types: 1 Cans of beer per week    Comment: occasional    Drug use: Never   Sexual activity: Yes

## 2022-05-10 ENCOUNTER — Encounter: Payer: Self-pay | Admitting: Oncology

## 2022-05-10 NOTE — Telephone Encounter (Signed)
Left VM for Jeffrey Young to check MyChart for message regarding his [employer] disability. Sent 04/03/22 office note with information that patient has difficulty with fine motor activity due to chemotherapy neuropathy and he is not able to work full time or full duty. Other specific work functions regarding lifting, sitting, standing, walking, etc should be evaluated and documented by a disability specialist physician.

## 2022-05-19 ENCOUNTER — Inpatient Hospital Stay (HOSPITAL_BASED_OUTPATIENT_CLINIC_OR_DEPARTMENT_OTHER): Payer: 59 | Admitting: Oncology

## 2022-05-19 ENCOUNTER — Inpatient Hospital Stay: Payer: 59 | Attending: Oncology

## 2022-05-19 ENCOUNTER — Inpatient Hospital Stay: Payer: 59

## 2022-05-19 VITALS — BP 143/86 | HR 78 | Temp 98.1°F | Resp 18 | Ht 73.0 in | Wt 201.6 lb

## 2022-05-19 DIAGNOSIS — C189 Malignant neoplasm of colon, unspecified: Secondary | ICD-10-CM

## 2022-05-19 DIAGNOSIS — C186 Malignant neoplasm of descending colon: Secondary | ICD-10-CM | POA: Insufficient documentation

## 2022-05-19 LAB — BASIC METABOLIC PANEL - CANCER CENTER ONLY
Anion gap: 7 (ref 5–15)
BUN: 24 mg/dL — ABNORMAL HIGH (ref 6–20)
CO2: 24 mmol/L (ref 22–32)
Calcium: 9 mg/dL (ref 8.9–10.3)
Chloride: 97 mmol/L — ABNORMAL LOW (ref 98–111)
Creatinine: 2.94 mg/dL — ABNORMAL HIGH (ref 0.61–1.24)
GFR, Estimated: 24 mL/min — ABNORMAL LOW (ref >60)
Glucose, Bld: 364 mg/dL — ABNORMAL HIGH (ref 70–99)
Potassium: 3.9 mmol/L (ref 3.5–5.1)
Sodium: 128 mmol/L — ABNORMAL LOW (ref 135–145)

## 2022-05-19 LAB — CEA (ACCESS): CEA (CHCC): 173.81 ng/mL — ABNORMAL HIGH (ref 0.00–5.00)

## 2022-05-19 MED ORDER — HEPARIN SOD (PORK) LOCK FLUSH 100 UNIT/ML IV SOLN
500.0000 [IU] | Freq: Once | INTRAVENOUS | Status: AC | PRN
  Administered 2022-05-19: 500 [IU]

## 2022-05-19 MED ORDER — SODIUM CHLORIDE 0.9% FLUSH
10.0000 mL | INTRAVENOUS | Status: DC | PRN
  Administered 2022-05-19: 10 mL

## 2022-05-19 NOTE — Progress Notes (Signed)
[facility] OFFICE PROGRESS NOTE   Diagnosis: Colon cancer  INTERVAL HISTORY:   Jeffrey Young returns as scheduled.  He feels "weak "today, but generally feels well.  He continues to have neuropathy symptoms in the extremities.  Objective:  Vital signs in last 24 hours:  Blood pressure (!) 143/86, pulse 78, temperature 98.1 F (36.7 C), temperature source Oral, resp. rate 18, height _0  (1.854 m), weight 201 lb 9.6 oz (91.4 kg), SpO2 100 %.    Lymphatics: No cervical, supraclavicular, axillary, or inguinal nodes Resp: Lungs clear bilaterally Cardio: Regular rate and rhythm GI: No hepatosplenomegaly Vascular: The left lower leg is slightly larger than the right side   Portacath/PICC-without erythema  Lab Results:  Lab Results  Component Value Date   WBC 6.2 04/13/2021   HGB 8.3 (L) 04/13/2021   HCT 25.4 (L) 04/13/2021   MCV 97.3 04/13/2021   PLT 190 04/13/2021   NEUTROABS 12.7 (H) 04/01/2021    CMP  Lab Results  Component Value Date   NA 128 (L) 05/19/2022   K 3.9 05/19/2022   CL 97 (L) 05/19/2022   CO2 24 05/19/2022   GLUCOSE 364 (H) 05/19/2022   BUN 24 (H) 05/19/2022   CREATININE 2.94 (H) 05/19/2022   CALCIUM 9.0 05/19/2022   PROT 6.2 (L) 04/03/2021   ALBUMIN 2.6 (L) 04/03/2021   AST 17 04/03/2021   ALT 9 04/03/2021   ALKPHOS 113 04/03/2021   BILITOT 1.9 (H) 04/03/2021   GFRNONAA 24 (L) 05/19/2022   GFRAA >60 01/26/2018    Lab Results  Component Value Date   CEA1 11.44 (H) 09/27/2020   CEA 173.81 (H) 05/19/2022      Medications: I have reviewed the patient's current medications.   Assessment/Plan:  Descending colon cancer, stage IIIc (T3N2b M0), status post a partial left colectomy 07/30/2020, 9/16 lymph nodes positive, lymphovascular invasion, 1 satellite nodule, negative margins, MSS, no loss of mismatch repair protein expression -History of large polyp in the left side of the colon-referred to [facility]2018 for procedure  canceled secondary to COVID-19 pandemic.  Procedure was not rescheduled. -CT chest/abdomen/pelvis with contrast 07/27/2020-3 small pulmonary nodules less than 5 mm favored to be benign, circumferential luminal narrowing of the distal transverse colon concerning for malignancy, no metastatic adenopathy in the mesentery porta hepatis, no for metastasis. -CEA on 07/27/2020 was 17.3; 37 on 08/30/2020; 33 on 09/13/2020 -Colonoscopy performed 07/27/2020 showed a fungating, infiltrative and ulcerated nonobstructing large mass in the proximal descending colon.  Biopsy-adenocarcinoma -Cycle 1 FOLFOX 08/30/2020 -Cycle 2 FOLFOX 09/13/2020, Emend added for delayed nausea -Cycle 3 FOLFOX 09/27/2020 -Cycle 4 FOLFOX 10/11/2020 -Cycle 5 FOLFOX 11/08/2020 -Cycle 6 FOLFOX 11/23/2020 -CT 12/03/2020-prior 3 mm left apical nodule no longer seen, no new/suspicious pulmonary nodules, no evidence of metastatic disease -Cycle 7 FOLFOX 12/06/2020 -Cycle 8 FOLFOX 12/21/2020 -Cycle 9 FOLFOX 01/03/2021 -Cycle 10 FOLFOX 01/17/2021 -Cycle 11 FOLFOX 01/31/2021, oxaliplatin held secondary to neuropathy symptoms -Cycle 12 FOLFOX 02/15/2021, oxaliplatin held secondary to neuropathy -CT abdomen/pelvis 04/02/2021-no evidence of recurrent colon cancer -CT chest 04/08/2021-no evidence of metastatic disease -Elevated CEA February and March 2023 -08/17/2021 PET scan-no evidence of local recurrence or metastasis -09/26/2021-Guardant-ctDNA detected -Colonoscopy 10/04/2021-negative -CT 11/17/2021-no evidence of metastatic disease -CTs 01/08/2022-no evidence of metastatic disease 2.  Anemia due to GI bleeding, iron deficiency?,  Renal insufficiency? 3.  New onset acute diastolic CHF March 1740 4.  Diabetes mellitus 5.  Renal insufficiency 6.  Hypertension 7.  History of left transmetatarsal amputation  8.  History of colon polyps 9.  Neuropathy 10.  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  The CEA is higher again today.  He will return for an office visit and restaging CTs in 6-7 weeks.  I encouraged him to work on blood sugar control.  Betsy Coder, MD  05/19/2022  1:14 PM

## 2022-06-12 ENCOUNTER — Ambulatory Visit (INDEPENDENT_AMBULATORY_CARE_PROVIDER_SITE_OTHER): Payer: 59 | Admitting: Orthopedic Surgery

## 2022-06-12 DIAGNOSIS — Z89432 Acquired absence of left foot: Secondary | ICD-10-CM

## 2022-06-12 DIAGNOSIS — L97521 Non-pressure chronic ulcer of other part of left foot limited to breakdown of skin: Secondary | ICD-10-CM | POA: Diagnosis not present

## 2022-06-13 ENCOUNTER — Encounter: Payer: Self-pay | Admitting: Orthopedic Surgery

## 2022-06-13 NOTE — Progress Notes (Signed)
Office Visit Note   Patient: Jeffrey Young           Date of Birth: 1965-08-08           MRN: [id number] Visit Date: 06/12/2022              Requested by: [name], MD [place] [address] [place],  [place] [address] PCP: [name], MD  Chief Complaint  Patient presents with   Left Foot - Follow-up    Transmet amp 01/22/18      HPI: Patient is a 57 year old gentleman who presents in follow-up for plantar ulcer left foot transmetatarsal amputation.  Patient states he is doing well without problems.  Assessment & Plan: Visit Diagnoses:  1. History of transmetatarsal amputation of left foot ([place])   2. Non-pressure chronic ulcer of other part of left foot limited to breakdown of skin ([place])     Plan: Ulcer was debrided he will continue with pressure offloading.  Follow-Up Instructions: Return in about 4 weeks (around 07/10/2022).   Ortho Exam  Patient is alert, oriented, no adenopathy, well-dressed, normal affect, normal respiratory effort. Examination patient's transmetatarsal amputation is healed well he has a palpable pulse.  He has a plantar Wagner grade 1 ulcer on the left transmetatarsal amputation.  After informed consent a 10 blade knife was used to debride the skin and soft tissue back to healthy viable granulation tissue.  Silver nitrate was used for hemostasis.  The ulcer was 2 cm in diameter and 2 mm deep after debridement.  Imaging: No results found. No images are attached to the encounter.  Labs: Lab Results  Component Value Date   HGBA1C 11.4 (H) 04/02/2021   HGBA1C 6.7 (H) 07/29/2020   HGBA1C 7.2 (H) 07/25/2020   ESRSEDRATE 121 (H) 01/23/2018   CRP 21.4 (H) 01/23/2018   REPTSTATUS 04/03/2021 FINAL 04/01/2021   GRAMSTAIN  01/22/2018    RARE WBC PRESENT, PREDOMINANTLY PMN MODERATE GRAM POSITIVE COCCI MODERATE GRAM NEGATIVE RODS FEW GRAM POSITIVE RODS Performed at [facility], [place] [address], [place], [place] [address]    CULT   04/01/2021    NO GROWTH Performed at [facility] [address] [place],  [address]    LABORGA PROTEUS MIRABILIS 01/22/2018     Lab Results  Component Value Date   ALBUMIN 2.6 (L) 04/03/2021   ALBUMIN 3.4 (L) 04/01/2021   ALBUMIN 3.4 (L) 02/28/2021   PREALBUMIN 6.2 (L) 01/23/2018    Lab Results  Component Value Date   MG 1.8 04/13/2021   MG 1.8 04/12/2021   MG 1.8 04/11/2021   No results found for: "VD25OH"  Lab Results  Component Value Date   PREALBUMIN 6.2 (L) 01/23/2018      Latest Ref Rng & Units 04/13/2021    3:45 AM 04/12/2021    4:25 AM 04/11/2021    3:00 AM  CBC EXTENDED  WBC 4.0 - 10.5 K/uL 6.2  6.0  6.6   RBC 4.22 - 5.81 MIL/uL 2.61  2.75  2.73   Hemoglobin 13.0 - 17.0 g/dL 8.3  8.8  8.7   HCT 39.0 - 52.0 % 25.4  26.9  26.5   Platelets 150 - 400 K/uL 190  209  208      There is no height or weight on file to calculate BMI.  Orders:  No orders of the defined types were placed in this encounter.  No orders of the defined types were placed in this  encounter.    Procedures: No procedures performed  Clinical Data: No additional findings.  ROS:  All other systems negative, except as noted in the HPI. Review of Systems  Objective: Vital Signs: There were no vitals taken for this visit.  Specialty Comments:  No specialty comments available.  PMFS History: Patient Active Problem List   Diagnosis Date Noted   Malignant neoplasm of colon ([place])    GI bleed 04/02/2021   Severe sepsis ([place]) 04/02/2021   AKI (acute kidney injury) ([place]) 03/24/1593   Acute metabolic encephalopathy 58/59/2924   Hypokalemia 04/02/2021   Encephalopathy    Nausea and vomiting    Coffee ground emesis    Elevated bilirubin    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 07/31/2020   IDA (iron deficiency anemia) from bleeding colon cancer 07/31/2020   Diabetic retinopathy ([place]) 07/31/2020   Hyperlipidemia 07/31/2020   Low back pain  07/31/2020   Shortness of breath 46/28/6381   Systolic heart failure ([place]) 07/31/2020   CKD (chronic kidney disease) stage 3, GFR 30-59 ml/min ([place]) 07/31/2020   Insulin-requiring or dependent type II diabetes mellitus ([place]) 07/31/2020   ARF (acute renal failure) ([place]) 07/25/2020   Symptomatic anemia    New onset of congestive heart failure ([place]) 07/24/2020   GIB (gastrointestinal bleeding) 07/24/2020   Hypertension associated with diabetes ([place]) 06/07/2019   Erectile dysfunction associated with type 2 diabetes mellitus ([place]) 05/08/2019   Sepsis ([place]) 01/22/2018   Diabetic ulcer of left foot ([place]) 01/22/2018   Status post transmetatarsal amputation of left foot ([place]) 01/22/2018   Diabetic mononeuropathy associated with type 2 diabetes mellitus ([place]) 03/21/2017   Microalbuminuria due to type 2 diabetes mellitus ([place]) 03/21/2017   Vitamin D deficiency 04/06/2009   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 Metatarsalgia  10/1 IMO update   Erectile dysfunction associated with type 2 diabetes mellitus ([place]) 05/08/2019   Hyperlipidemia 07/31/2020   Hypertension associated with diabetes ([place]) 06/07/2019   Microalbuminuria due to type 2 diabetes mellitus ([place]) 03/21/2017   Necrotizing fasciitis of ankle and foot ([place]) 01/22/2018   Necrotizing soft tissue infection    Status post transmetatarsal amputation of left foot ([place]) 11/26/1163   Systolic heart failure ([place]) 07/31/2020   Uncontrolled type 2 diabetes mellitus  with both eyes affected by severe nonproliferative retinopathy and macular edema, with long-term current use of insulin 04/02/2009   Formatting of this note might be different from the original. Type 2 Diabetes Mellitus - Uncomplicated, Uncontrolled    Family History  Problem Relation Age of Onset   Hypertension Father     Past Surgical History:  Procedure Laterality Date   AMPUTATION Left 01/22/2018   Procedure: TRANSMETATARSAL AMPUTATION;  Surgeon: Newt Minion, MD;  Location: [facility];  Service: Orthopedics;  Laterality: Left;toes   BIOPSY  07/27/2020   Procedure: BIOPSY;  Surgeon: Carol Ada, MD;  [facility];  Service: Endoscopy;;   COLON RESECTION N/A 07/30/2020   Procedure: HAND ASSISTED LAPAROSCOPIC LEFT HEMI COLECTOMY;  Surgeon: Johnathan Hausen, MD;  [facility];  Service: General;  Laterality: N/A;   COLONOSCOPY WITH PROPOFOL N/A 05/24/2018   Procedure: COLONOSCOPY WITH PROPOFOL;  Surgeon: Carol Ada, MD;  [facility];  Service: Endoscopy;  Laterality: N/A;   COLONOSCOPY WITH PROPOFOL N/A 07/27/2020   Procedure: COLONOSCOPY WITH PROPOFOL;  Surgeon: Carol Ada, MD;  [facility];  Service: Endoscopy;  Laterality: N/A;   ESOPHAGOGASTRODUODENOSCOPY Left 04/05/2021   Procedure: ESOPHAGOGASTRODUODENOSCOPY (EGD);  Surgeon: Carol Ada, MD;  [facility];  Service: Endoscopy;  Laterality: Left;   POLYPECTOMY  05/24/2018   Procedure: POLYPECTOMY;  Surgeon: Carol Ada, MD;  [facility];  Service: Endoscopy;;   PORTACATH PLACEMENT Left 08/24/2020   Procedure: INSERTION PORT-A-CATH;  Surgeon: Johnathan Hausen, MD;  [facility];  Service: General;  Laterality: Left;  75/rm1   SUBMUCOSAL TATTOO INJECTION  07/27/2020   Procedure: SUBMUCOSAL TATTOO INJECTION;  Surgeon: Carol Ada, MD;  [facility];  Service: Endoscopy;;   Social History   Occupational History   Not on file  Tobacco Use   Smoking status: Never    Smokeless tobacco: Never  Vaping Use   Vaping Use: Never used  Substance and Sexual Activity   Alcohol use: Yes    Alcohol/week: 1.0 standard drink of alcohol    Types: 1 Cans of beer per week    Comment: occasional   Drug use: Never   Sexual activity: Yes

## 2022-06-19 ENCOUNTER — Encounter: Payer: Self-pay | Admitting: Nurse Practitioner

## 2022-07-07 ENCOUNTER — Inpatient Hospital Stay: Payer: 59

## 2022-07-07 ENCOUNTER — Ambulatory Visit (HOSPITAL_BASED_OUTPATIENT_CLINIC_OR_DEPARTMENT_OTHER)
Admission: RE | Admit: 2022-07-07 | Discharge: 2022-07-07 | Disposition: A | Discharge status: Home / Self Care | Payer: 59 | Source: Ambulatory Visit | Attending: Oncology | Admitting: Oncology

## 2022-07-07 ENCOUNTER — Encounter: Payer: Self-pay | Admitting: Nurse Practitioner

## 2022-07-07 ENCOUNTER — Telehealth: Payer: Self-pay | Admitting: *Deleted

## 2022-07-07 ENCOUNTER — Inpatient Hospital Stay: Payer: 59 | Attending: Oncology | Admitting: Oncology

## 2022-07-07 ENCOUNTER — Other Ambulatory Visit: Payer: 59

## 2022-07-07 VITALS — BP 155/90 | HR 85 | Temp 98.1°F | Resp 18 | Ht 73.0 in | Wt 199.2 lb

## 2022-07-07 DIAGNOSIS — Z9221 Personal history of antineoplastic chemotherapy: Secondary | ICD-10-CM | POA: Diagnosis not present

## 2022-07-07 DIAGNOSIS — C189 Malignant neoplasm of colon, unspecified: Secondary | ICD-10-CM | POA: Insufficient documentation

## 2022-07-07 DIAGNOSIS — R970 Elevated carcinoembryonic antigen [CEA]: Secondary | ICD-10-CM | POA: Insufficient documentation

## 2022-07-07 DIAGNOSIS — C186 Malignant neoplasm of descending colon: Secondary | ICD-10-CM | POA: Insufficient documentation

## 2022-07-07 LAB — CBC WITH DIFFERENTIAL (CANCER CENTER ONLY)
Abs Immature Granulocytes: 0.05 10*3/uL (ref 0.00–0.07)
Basophils Absolute: 0 10*3/uL (ref 0.0–0.1)
Basophils Relative: 1 %
Eosinophils Absolute: 0.1 10*3/uL (ref 0.0–0.5)
Eosinophils Relative: 2 %
HCT: 31.7 % — ABNORMAL LOW (ref 39.0–52.0)
Hemoglobin: 11 g/dL — ABNORMAL LOW (ref 13.0–17.0)
Immature Granulocytes: 1 %
Lymphocytes Relative: 18 %
Lymphs Abs: 1.4 10*3/uL (ref 0.7–4.0)
MCH: 30.6 pg (ref 26.0–34.0)
MCHC: 34.7 g/dL (ref 30.0–36.0)
MCV: 88.3 fL (ref 80.0–100.0)
Monocytes Absolute: 0.7 10*3/uL (ref 0.1–1.0)
Monocytes Relative: 8 %
Neutro Abs: 5.7 10*3/uL (ref 1.7–7.7)
Neutrophils Relative %: 70 %
Platelet Count: 241 10*3/uL (ref 150–400)
RBC: 3.59 MIL/uL — ABNORMAL LOW (ref 4.22–5.81)
RDW: 12.7 % (ref 11.5–15.5)
WBC Count: 7.9 10*3/uL (ref 4.0–10.5)
nRBC: 0 % (ref 0.0–0.2)

## 2022-07-07 LAB — CMP (CANCER CENTER ONLY)
ALT: 6 U/L (ref 0–44)
AST: 10 U/L — ABNORMAL LOW (ref 15–41)
Albumin: 3.1 g/dL — ABNORMAL LOW (ref 3.5–5.0)
Alkaline Phosphatase: 152 U/L — ABNORMAL HIGH (ref 38–126)
Anion gap: 8 (ref 5–15)
BUN: 30 mg/dL — ABNORMAL HIGH (ref 6–20)
CO2: 25 mmol/L (ref 22–32)
Calcium: 8.8 mg/dL — ABNORMAL LOW (ref 8.9–10.3)
Chloride: 96 mmol/L — ABNORMAL LOW (ref 98–111)
Creatinine: 3.17 mg/dL — ABNORMAL HIGH (ref 0.61–1.24)
GFR, Estimated: 22 mL/min — ABNORMAL LOW (ref >60)
Glucose, Bld: 467 mg/dL — ABNORMAL HIGH (ref 70–99)
Potassium: 3.8 mmol/L (ref 3.5–5.1)
Sodium: 129 mmol/L — ABNORMAL LOW (ref 135–145)
Total Bilirubin: 0.3 mg/dL (ref 0.3–1.2)
Total Protein: 6.9 g/dL (ref 6.5–8.1)

## 2022-07-07 LAB — CEA (ACCESS): CEA (CHCC): 131.97 ng/mL — ABNORMAL HIGH (ref 0.00–5.00)

## 2022-07-07 MED ORDER — HEPARIN SOD (PORK) LOCK FLUSH 100 UNIT/ML IV SOLN
500.0000 [IU] | Freq: Once | INTRAVENOUS | Status: AC | PRN
  Administered 2022-07-07: 500 [IU]

## 2022-07-07 MED ORDER — SODIUM CHLORIDE 0.9% FLUSH
10.0000 mL | INTRAVENOUS | Status: DC | PRN
  Administered 2022-07-07: 10 mL

## 2022-07-07 NOTE — Progress Notes (Signed)
[facility] OFFICE PROGRESS NOTE   Diagnosis: Colon cancer  INTERVAL HISTORY:   Jeffrey Young returns as scheduled.  He feels well.  Good appetite.  He continues to have neuropathy symptoms in the extremities.  No new complaint.  Objective:  Vital signs in last 24 hours:  Blood pressure (!) 155/90, pulse 85, temperature 98.1 F (36.7 C), temperature source Oral, resp. rate 18, height 6' 1"$  (1.854 m), weight 199 lb 3.2 oz (90.4 kg), SpO2 100 %.     Lymphatics: No cervical, supraclavicular, axillary, or inguinal nodes Resp: Lungs clear bilaterally Cardio: Regular rate and rhythm GI: No hepatosplenomegaly, nontender, no mass Vascular: The left lower leg is larger than the right side, no edema  Portacath/PICC-without erythema  Lab Results:  Lab Results  Component Value Date   WBC 7.9 07/07/2022   HGB 11.0 (L) 07/07/2022   HCT 31.7 (L) 07/07/2022   MCV 88.3 07/07/2022   PLT 241 07/07/2022   NEUTROABS 5.7 07/07/2022    CMP  Lab Results  Component Value Date   NA 129 (L) 07/07/2022   K 3.8 07/07/2022   CL 96 (L) 07/07/2022   CO2 25 07/07/2022   GLUCOSE 467 (H) 07/07/2022   BUN 30 (H) 07/07/2022   CREATININE 3.17 (H) 07/07/2022   CALCIUM 8.8 (L) 07/07/2022   PROT 6.9 07/07/2022   ALBUMIN 3.1 (L) 07/07/2022   AST 10 (L) 07/07/2022   ALT 6 07/07/2022   ALKPHOS 152 (H) 07/07/2022   BILITOT 0.3 07/07/2022   GFRNONAA 22 (L) 07/07/2022   GFRAA >60 01/26/2018    Lab Results  Component Value Date   CEA1 11.44 (H) 09/27/2020   CEA 131.97 (H) 07/07/2022    Medications: I have reviewed the patient's current medications.   Assessment/Plan: Descending colon cancer, stage IIIc (T3N2b M0), status post a partial left colectomy 07/30/2020, 9/16 lymph nodes positive, lymphovascular invasion, 1 satellite nodule, negative margins, MSS, no loss of mismatch repair protein expression -History of large polyp in the left side of the colon-referred to [facility] in 05/2018  for procedure canceled secondary to COVID-19 pandemic.  Procedure was not rescheduled. -CT chest/abdomen/pelvis with contrast 07/27/2020-3 small pulmonary nodules less than 5 mm favored to be benign, circumferential luminal narrowing of the distal transverse colon concerning for malignancy, no metastatic adenopathy in the mesentery porta hepatis, no for metastasis. -CEA on 07/27/2020 was 17.3; 37 on 08/30/2020; 33 on 09/13/2020 -Colonoscopy performed 07/27/2020 showed a fungating, infiltrative and ulcerated nonobstructing large mass in the proximal descending colon.  Biopsy-adenocarcinoma -Cycle 1 FOLFOX 08/30/2020 -Cycle 2 FOLFOX 09/13/2020, Emend added for delayed nausea -Cycle 3 FOLFOX 09/27/2020 -Cycle 4 FOLFOX 10/11/2020 -Cycle 5 FOLFOX 11/08/2020 -Cycle 6 FOLFOX 11/23/2020 -CT 12/03/2020-prior 3 mm left apical nodule no longer seen, no new/suspicious pulmonary nodules, no evidence of metastatic disease -Cycle 7 FOLFOX 12/06/2020 -Cycle 8 FOLFOX 12/21/2020 -Cycle 9 FOLFOX 01/03/2021 -Cycle 10 FOLFOX 01/17/2021 -Cycle 11 FOLFOX 01/31/2021, oxaliplatin held secondary to neuropathy symptoms -Cycle 12 FOLFOX 02/15/2021, oxaliplatin held secondary to neuropathy -CT abdomen/pelvis 04/02/2021-no evidence of recurrent colon cancer -CT chest 04/08/2021-no evidence of metastatic disease -Elevated CEA February and March 2023 -08/17/2021 PET scan-no evidence of local recurrence or metastasis -09/26/2021-Guardant-ctDNA detected -Colonoscopy 10/04/2021-negative -CT 11/17/2021-no evidence of metastatic disease -CTs 01/08/2022-no evidence of metastatic disease 2.  Anemia due to GI bleeding, iron deficiency?,  Renal insufficiency? 3.  New onset acute diastolic CHF March [id number] 4.  Diabetes mellitus 5.  Renal insufficiency 6.  Hypertension 7.  History of  left transmetatarsal amputation 8.  History of colon polyps 9.  Neuropathy 10.  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  The CEA remains elevated, but there has been no clinical or radiologic evidence of disease progression to date.  He underwent restaging CTs earlier today.  The final reading is not yet available.  I reviewed the images with Jeffrey Young.  I see no gross evidence of recurrent disease.  We will contact him when the images are read.  He will return for an office visit and CEA in 6 weeks.  Betsy Coder, MD  07/07/2022  10:56 AM

## 2022-07-07 NOTE — Telephone Encounter (Signed)
Called Jeffrey Young with CEA results and elevated glucose and creatinine. Jeffrey Young needs to f/u with PCP regarding the glucose and his renal failure. He does not have a nephrologist. Routed labs to Dr. Coletta Memos.

## 2022-07-10 ENCOUNTER — Encounter: Payer: Self-pay | Admitting: Nurse Practitioner

## 2022-07-10 ENCOUNTER — Ambulatory Visit (INDEPENDENT_AMBULATORY_CARE_PROVIDER_SITE_OTHER): Payer: 59 | Admitting: Orthopedic Surgery

## 2022-07-10 ENCOUNTER — Telehealth: Payer: Self-pay | Admitting: *Deleted

## 2022-07-10 DIAGNOSIS — C189 Malignant neoplasm of colon, unspecified: Secondary | ICD-10-CM

## 2022-07-10 DIAGNOSIS — Z89432 Acquired absence of left foot: Secondary | ICD-10-CM | POA: Diagnosis not present

## 2022-07-10 NOTE — Telephone Encounter (Signed)
Confirmed with Mrs. Ostermeyer that Teshaun  needs to f/u with his PCP regarding high blood sugar reading and to have PCP refer him to renal specialist for his decline in renal functions.  Also re: CT scan. Dr. Benay Spice said he has small nodes left supraclavicular area and back of abdomen. It could indicate early recurrence of cancer. Can schedule PET next month to further evaluate. She is requesting the PET be scheduled.

## 2022-07-11 ENCOUNTER — Encounter: Payer: Self-pay | Admitting: Orthopedic Surgery

## 2022-07-11 NOTE — Progress Notes (Signed)
Office Visit Note   Patient: Jeffrey Young           Date of Birth: 07/16/1965           MRN: [id number] Visit Date: 07/10/2022              Requested by: [name], MD [place],  [place] [id number] PCP: [name], MD  Chief Complaint  Patient presents with   Left Foot - Follow-up    01/22/2018 hx transmet amputation       HPI: Patient is a 57 year old gentleman who presents in follow-up for [facility] grade 1 ulcer plantar aspect left foot status post transmetatarsal amputation.  Patient has custom multidensity orthotics and spacer.  Assessment & Plan: Visit Diagnoses:  1. History of transmetatarsal amputation of left foot ([place])     Plan: Continue with protective shoe wear and minimize weightbearing.  Follow-Up Instructions: Return in about 4 weeks (around 08/07/2022).   Ortho Exam  Patient is alert, oriented, no adenopathy, well-dressed, normal affect, normal respiratory effort. Examination patient has a Wagner grade 1 ulcer on the plantar aspect of the left transmetatarsal amputation.  Previous ABIs showed triphasic flow.  After informed consent a 10 blade knife was used to debride the skin and soft tissue back to healthy viable granulation tissue this did not probe to bone or tendon.  The ulcer was 4 cm in diameter 1 mm deep after debridement.  Silver nitrate was used hemostasis.  Imaging: No results found. No images are attached to the encounter.  Labs: Lab Results  Component Value Date   HGBA1C 11.4 (H) 04/02/2021   HGBA1C 6.7 (H) 07/29/2020   HGBA1C 7.2 (H) 07/25/2020   ESRSEDRATE 121 (H) 01/23/2018   CRP 21.4 (H) 01/23/2018   REPTSTATUS 04/03/2021 FINAL 04/01/2021   GRAMSTAIN  01/22/2018    RARE WBC PRESENT, PREDOMINANTLY PMN MODERATE GRAM POSITIVE COCCI MODERATE GRAM NEGATIVE RODS FEW GRAM POSITIVE RODS Performed at [facility], [place] [address], [place], [place] [address]    CULT  04/01/2021    NO  GROWTH Performed at [facility] [address], [place], [place] [address]    LABORGA PROTEUS MIRABILIS 01/22/2018     Lab Results  Component Value Date   ALBUMIN 3.1 (L) 07/07/2022   ALBUMIN 2.6 (L) 04/03/2021   ALBUMIN 3.4 (L) 04/01/2021   PREALBUMIN 6.2 (L) 01/23/2018    Lab Results  Component Value Date   MG 1.8 04/13/2021   MG 1.8 04/12/2021   MG 1.8 04/11/2021   No results found for: "VD25OH"  Lab Results  Component Value Date   PREALBUMIN 6.2 (L) 01/23/2018      Latest Ref Rng & Units 07/07/2022    8:08 AM 04/13/2021    3:45 AM 04/12/2021    4:25 AM  CBC EXTENDED  WBC 4.0 - 10.5 K/uL 7.9  6.2  6.0   RBC 4.22 - 5.81 MIL/uL 3.59  2.61  2.75   Hemoglobin 13.0 - 17.0 g/dL 11.0  8.3  8.8   HCT 39.0 - 52.0 % 31.7  25.4  26.9   Platelets 150 - 400 K/uL 241  190  209   NEUT# 1.7 - 7.7 K/uL 5.7     Lymph# 0.7 - 4.0 K/uL 1.4        There is no height or weight on file to calculate BMI.  Orders:  No orders of the defined types were placed in this encounter.  No orders of the defined types were placed in this encounter.    Procedures: No procedures performed  Clinical Data: No additional findings.  ROS:  All other systems negative, except as noted in the HPI. Review of Systems  Objective: Vital Signs: There were no vitals taken for this visit.  Specialty Comments:  No specialty comments available.  PMFS History: Patient Active Problem List   Diagnosis Date Noted   Malignant neoplasm of colon ([place])    GI bleed 04/02/2021   Severe sepsis ([place]) 04/02/2021   AKI (acute kidney injury) ([place]) 99991111   Acute metabolic encephalopathy 99991111   Hypokalemia 04/02/2021   Encephalopathy    Nausea and vomiting    Coffee ground emesis    Elevated bilirubin    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 07/31/2020   IDA (iron deficiency anemia) from bleeding colon cancer 07/31/2020   Diabetic retinopathy ([place]) 07/31/2020    Hyperlipidemia 07/31/2020   Low back pain 07/31/2020   Shortness of breath [id number]   Systolic heart failure ([place]) 07/31/2020   CKD (chronic kidney disease) stage 3, GFR 30-59 ml/min ([place]) 07/31/2020   Insulin-requiring or dependent type II diabetes mellitus ([place]) 07/31/2020   ARF (acute renal failure) ([place]) 07/25/2020   Symptomatic anemia    New onset of congestive heart failure ([place]) 07/24/2020   GIB (gastrointestinal bleeding) 07/24/2020   Hypertension associated with diabetes ([place]) 06/07/2019   Erectile dysfunction associated with type 2 diabetes mellitus ([place]) 05/08/2019   Sepsis ([place]) 01/22/2018   Diabetic ulcer of left foot ([place]) 01/22/2018   Status post transmetatarsal amputation of left foot ([place]) 01/22/2018   Diabetic mononeuropathy associated with type 2 diabetes mellitus ([place]) 03/21/2017   Microalbuminuria due to type 2 diabetes mellitus ([place]) 03/21/2017   Vitamin D deficiency 04/06/2009   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 Type 2 Diabetes Mellitus - Uncomplicated, Uncontrolled    Family History  Problem Relation Age of Onset   Hypertension Father     Past Surgical History:  Procedure Laterality Date   AMPUTATION Left 01/22/2018   Procedure: TRANSMETATARSAL AMPUTATION;  Surgeon: Newt Minion, MD;  Location: [facility];  Service: Orthopedics;  Laterality: Left;toes   BIOPSY  07/27/2020   Procedure: BIOPSY;  Surgeon: Carol Ada, MD;  [facility];  Service: Endoscopy;;   COLON RESECTION N/A 07/30/2020  Procedure: HAND ASSISTED LAPAROSCOPIC LEFT HEMI COLECTOMY;  Surgeon: Johnathan Hausen, MD;  [facility];  Service: General;  Laterality: N/A;   COLONOSCOPY WITH PROPOFOL N/A 05/24/2018   Procedure: COLONOSCOPY WITH PROPOFOL;  Surgeon: Carol Ada, MD;  [facility];  Service: Endoscopy;  Laterality: N/A;   COLONOSCOPY WITH PROPOFOL N/A 07/27/2020   Procedure: COLONOSCOPY WITH PROPOFOL;  Surgeon: Carol Ada, MD;  [facility];  Service: Endoscopy;  Laterality: N/A;   ESOPHAGOGASTRODUODENOSCOPY Left 04/05/2021   Procedure: ESOPHAGOGASTRODUODENOSCOPY (EGD);  Surgeon: Carol Ada, MD;  [facility];  Service: Endoscopy;  Laterality: Left;   POLYPECTOMY  05/24/2018   Procedure: POLYPECTOMY;  Surgeon: Carol Ada, MD;  [facility];  Service: Endoscopy;;   PORTACATH PLACEMENT Left 08/24/2020   Procedure: INSERTION PORT-A-CATH;  Surgeon: Johnathan Hausen, MD;  [facility];  Service: General;  Laterality: Left;  75/rm1   SUBMUCOSAL TATTOO INJECTION  07/27/2020   Procedure: SUBMUCOSAL TATTOO INJECTION;  Surgeon: Carol Ada, MD;  [facility];  Service: Endoscopy;;   Social History   Occupational History   Not on file   Tobacco Use   Smoking status: Never   Smokeless tobacco: Never  Vaping Use   Vaping Use: Never used  Substance and Sexual Activity   Alcohol use: Yes    Alcohol/week: 1.0 standard drink of alcohol    Types: 1 Cans of beer per week    Comment: occasional   Drug use: Never   Sexual activity: Yes

## 2022-08-08 ENCOUNTER — Ambulatory Visit: Payer: 59 | Admitting: Orthopedic Surgery

## 2022-08-10 ENCOUNTER — Ambulatory Visit: Payer: 59 | Admitting: Orthopedic Surgery

## 2022-08-10 ENCOUNTER — Encounter (HOSPITAL_COMMUNITY)
Admission: RE | Admit: 2022-08-10 | Discharge: 2022-08-10 | Disposition: A | Discharge status: Home / Self Care | Payer: 59 | Source: Ambulatory Visit | Attending: Oncology | Admitting: Oncology

## 2022-08-10 DIAGNOSIS — C189 Malignant neoplasm of colon, unspecified: Secondary | ICD-10-CM | POA: Diagnosis present

## 2022-08-10 LAB — GLUCOSE, CAPILLARY: Glucose-Capillary: 358 mg/dL — ABNORMAL HIGH (ref 70–99)

## 2022-08-10 MED ORDER — FLUDEOXYGLUCOSE F - 18 (FDG) INJECTION
10.0000 | Freq: Once | INTRAVENOUS | Status: AC
  Administered 2022-08-10: 9.92 via INTRAVENOUS

## 2022-08-14 ENCOUNTER — Encounter: Payer: Self-pay | Admitting: Orthopedic Surgery

## 2022-08-14 ENCOUNTER — Ambulatory Visit (INDEPENDENT_AMBULATORY_CARE_PROVIDER_SITE_OTHER): Payer: 59 | Admitting: Orthopedic Surgery

## 2022-08-14 DIAGNOSIS — Z89432 Acquired absence of left foot: Secondary | ICD-10-CM

## 2022-08-14 DIAGNOSIS — L97521 Non-pressure chronic ulcer of other part of left foot limited to breakdown of skin: Secondary | ICD-10-CM

## 2022-08-14 NOTE — Progress Notes (Signed)
Office Visit Note   Patient: Jeffrey Young           Date of Birth: 1966/04/19           MRN: [id number] Visit Date: 08/14/2022              Requested by: [name], MD [place],  [place] [phone] PCP: [name], MD  Chief Complaint  Patient presents with   Left Foot - Follow-up    Hx transmet follow up ulcer       HPI: Patient is a 57 year old gentleman who is seen in follow-up for [facility] grade 1 ulcer plantar aspect left foot status post transmetatarsal amputation.  Patient is currently wearing extra-depth shoes custom orthotics and the spacer he is on minimizing his weightbearing.  Assessment & Plan: Visit Diagnoses:  1. History of transmetatarsal amputation of left foot ([place])   2. Non-pressure chronic ulcer of other part of left foot limited to breakdown of skin ([place])     Plan: Ulcer was debrided patient was provided a prescription for [employer] for extra-depth shoes custom orthotics and a spacer for the left foot.  Follow-Up Instructions: Return in about 4 weeks (around 09/11/2022).   Ortho Exam  Patient is alert, oriented, no adenopathy, well-dressed, normal affect, normal respiratory effort. Examination patient has a Wagner grade 1 ulcer on the plantar aspect of the left foot.  There is no cellulitis no odor no drainage no exposed bone or tendon.  After informed consent a 10 blade knife was used to debride the skin and soft tissue back to healthy viable granulation tissue this was touched with silver nitrate.  A donated sample of Kerecis micro graft was applied covered with a dry dressing bolster.  Wound is 3 x 3 cm after debridement and 3 mm deep.  Imaging: No results found. No images are attached to the encounter.  Labs: Lab Results  Component Value Date   HGBA1C 11.4 (H) 04/02/2021   HGBA1C 6.7 (H) 07/29/2020   HGBA1C 7.2 (H) 07/25/2020   ESRSEDRATE 121 (H) 01/23/2018   CRP 21.4 (H) 01/23/2018   REPTSTATUS 04/03/2021  FINAL 04/01/2021   GRAMSTAIN  01/22/2018    RARE WBC PRESENT, PREDOMINANTLY PMN MODERATE GRAM POSITIVE COCCI MODERATE GRAM NEGATIVE RODS FEW GRAM POSITIVE RODS Performed at [facility], [place] [address], [place], [place] [address]    CULT  04/01/2021    NO GROWTH Performed at [facility] [address], [place],  [address]    LABORGA PROTEUS MIRABILIS 01/22/2018     Lab Results  Component Value Date   ALBUMIN 3.1 (L) 07/07/2022   ALBUMIN 2.6 (L) 04/03/2021   ALBUMIN 3.4 (L) 04/01/2021   PREALBUMIN 6.2 (L) 01/23/2018    Lab Results  Component Value Date   MG 1.8 04/13/2021   MG 1.8 04/12/2021   MG 1.8 04/11/2021   No results found for: "VD25OH"  Lab Results  Component Value Date   PREALBUMIN 6.2 (L) 01/23/2018      Latest Ref Rng & Units 07/07/2022    8:08 AM 04/13/2021    3:45 AM 04/12/2021    4:25 AM  CBC EXTENDED  WBC 4.0 - 10.5 K/uL 7.9  6.2  6.0   RBC 4.22 - 5.81 MIL/uL 3.59  2.61  2.75   Hemoglobin 13.0 - 17.0 g/dL 11.0  8.3  8.8   HCT 39.0 - 52.0 % 31.7  25.4  26.9   Platelets  150 - 400 K/uL 241  190  209   NEUT# 1.7 - 7.7 K/uL 5.7     Lymph# 0.7 - 4.0 K/uL 1.4        There is no height or weight on file to calculate BMI.  Orders:  No orders of the defined types were placed in this encounter.  No orders of the defined types were placed in this encounter.    Procedures: No procedures performed  Clinical Data: No additional findings.  ROS:  All other systems negative, except as noted in the HPI. Review of Systems  Objective: Vital Signs: There were no vitals taken for this visit.  Specialty Comments:  No specialty comments available.  PMFS History: Patient Active Problem List   Diagnosis Date Noted   Malignant neoplasm of colon ([place])    GI bleed 04/02/2021   Severe sepsis ([place]) 04/02/2021   AKI (acute kidney injury) ([place]) 99991111   Acute metabolic encephalopathy 99991111   Hypokalemia 04/02/2021    Encephalopathy    Nausea and vomiting    Coffee ground emesis    Elevated bilirubin    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 07/31/2020   IDA (iron deficiency anemia) from bleeding colon cancer 07/31/2020   Diabetic retinopathy ([place]) 07/31/2020   Hyperlipidemia 07/31/2020   Low back pain 07/31/2020   Shortness of breath [id number]   Systolic heart failure ([place]) 07/31/2020   CKD (chronic kidney disease) stage 3, GFR 30-59 ml/min ([place]) 07/31/2020   Insulin-requiring or dependent type II diabetes mellitus ([place]) 07/31/2020   ARF (acute renal failure) ([place]) 07/25/2020   Symptomatic anemia    New onset of congestive heart failure ([place]) 07/24/2020   GIB (gastrointestinal bleeding) 07/24/2020   Hypertension associated with diabetes ([place]) 06/07/2019   Erectile dysfunction associated with type 2 diabetes mellitus ([place]) 05/08/2019   Sepsis ([place]) 01/22/2018   Diabetic ulcer of left foot ([place]) 01/22/2018   Status post transmetatarsal amputation of left foot ([place]) 01/22/2018   Diabetic mononeuropathy associated with type 2 diabetes mellitus ([place]) 03/21/2017   Microalbuminuria due to type 2 diabetes mellitus ([place]) 03/21/2017   Vitamin D deficiency 04/06/2009   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 Type 2 Diabetes Mellitus - Uncomplicated, Uncontrolled    Family History  Problem Relation Age of Onset   Hypertension Father  Past Surgical History:  Procedure Laterality Date   AMPUTATION Left 01/22/2018   Procedure: TRANSMETATARSAL AMPUTATION;  Surgeon: Newt Minion, MD;  Location: [facility];  Service: Orthopedics;  Laterality: Left;toes   BIOPSY  07/27/2020   Procedure: BIOPSY;  Surgeon: Carol Ada, MD;  [facility];  Service: Endoscopy;;   COLON RESECTION N/A 07/30/2020   Procedure: HAND ASSISTED LAPAROSCOPIC LEFT HEMI COLECTOMY;  Surgeon: Johnathan Hausen, MD;  [facility];  Service: General;  Laterality: N/A;   COLONOSCOPY WITH PROPOFOL N/A 05/24/2018   Procedure: COLONOSCOPY WITH PROPOFOL;  Surgeon: Carol Ada, MD;  [facility];  Service: Endoscopy;  Laterality: N/A;   COLONOSCOPY WITH PROPOFOL N/A 07/27/2020   Procedure: COLONOSCOPY WITH PROPOFOL;  Surgeon: Carol Ada, MD;  [facility];  Service: Endoscopy;  Laterality: N/A;   ESOPHAGOGASTRODUODENOSCOPY Left 04/05/2021   Procedure: ESOPHAGOGASTRODUODENOSCOPY (EGD);  Surgeon: Carol Ada, MD;  [facility];  Service: Endoscopy;  Laterality: Left;   POLYPECTOMY  05/24/2018   Procedure: POLYPECTOMY;  Surgeon: Carol Ada, MD;  [facility];  Service: Endoscopy;;   PORTACATH PLACEMENT Left 08/24/2020   Procedure: INSERTION PORT-A-CATH;  Surgeon: Johnathan Hausen, MD;  [facility];  Service: General;  Laterality: Left;  75/rm1   SUBMUCOSAL TATTOO INJECTION  07/27/2020   Procedure: SUBMUCOSAL TATTOO INJECTION;  Surgeon: Carol Ada, MD;  [facility];  Service: Endoscopy;;   Social History   Occupational History   Not on file  Tobacco Use   Smoking status: Never   Smokeless tobacco: Never  Vaping Use   Vaping Use: Never used  Substance and Sexual Activity   Alcohol use: Yes    Alcohol/week: 1.0 standard drink of alcohol    Types: 1 Cans of beer per week    Comment: occasional   Drug use: Never   Sexual activity: Yes

## 2022-08-21 ENCOUNTER — Encounter: Payer: Self-pay | Admitting: Nurse Practitioner

## 2022-08-21 ENCOUNTER — Inpatient Hospital Stay: Payer: 59 | Attending: Oncology

## 2022-08-21 ENCOUNTER — Inpatient Hospital Stay (HOSPITAL_BASED_OUTPATIENT_CLINIC_OR_DEPARTMENT_OTHER): Payer: 59 | Admitting: Nurse Practitioner

## 2022-08-21 ENCOUNTER — Inpatient Hospital Stay: Payer: 59

## 2022-08-21 VITALS — BP 160/96 | HR 82 | Temp 98.1°F | Resp 18 | Ht 73.0 in | Wt 205.0 lb

## 2022-08-21 DIAGNOSIS — C186 Malignant neoplasm of descending colon: Secondary | ICD-10-CM | POA: Diagnosis present

## 2022-08-21 DIAGNOSIS — C189 Malignant neoplasm of colon, unspecified: Secondary | ICD-10-CM | POA: Diagnosis not present

## 2022-08-21 DIAGNOSIS — Z79899 Other long term (current) drug therapy: Secondary | ICD-10-CM | POA: Diagnosis not present

## 2022-08-21 DIAGNOSIS — Z9049 Acquired absence of other specified parts of digestive tract: Secondary | ICD-10-CM | POA: Diagnosis not present

## 2022-08-21 DIAGNOSIS — N289 Disorder of kidney and ureter, unspecified: Secondary | ICD-10-CM | POA: Insufficient documentation

## 2022-08-21 DIAGNOSIS — T451X5A Adverse effect of antineoplastic and immunosuppressive drugs, initial encounter: Secondary | ICD-10-CM | POA: Insufficient documentation

## 2022-08-21 DIAGNOSIS — I110 Hypertensive heart disease with heart failure: Secondary | ICD-10-CM | POA: Insufficient documentation

## 2022-08-21 DIAGNOSIS — I5032 Chronic diastolic (congestive) heart failure: Secondary | ICD-10-CM | POA: Insufficient documentation

## 2022-08-21 DIAGNOSIS — G620 Drug-induced polyneuropathy: Secondary | ICD-10-CM | POA: Insufficient documentation

## 2022-08-21 DIAGNOSIS — E119 Type 2 diabetes mellitus without complications: Secondary | ICD-10-CM | POA: Insufficient documentation

## 2022-08-21 DIAGNOSIS — R918 Other nonspecific abnormal finding of lung field: Secondary | ICD-10-CM | POA: Diagnosis not present

## 2022-08-21 DIAGNOSIS — Z8719 Personal history of other diseases of the digestive system: Secondary | ICD-10-CM | POA: Insufficient documentation

## 2022-08-21 LAB — BASIC METABOLIC PANEL - CANCER CENTER ONLY
Anion gap: 8 (ref 5–15)
BUN: 36 mg/dL — ABNORMAL HIGH (ref 6–20)
CO2: 24 mmol/L (ref 22–32)
Calcium: 8.4 mg/dL — ABNORMAL LOW (ref 8.9–10.3)
Chloride: 95 mmol/L — ABNORMAL LOW (ref 98–111)
Creatinine: 3.11 mg/dL — ABNORMAL HIGH (ref 0.61–1.24)
GFR, Estimated: 22 mL/min — ABNORMAL LOW (ref >60)
Glucose, Bld: 588 mg/dL (ref 70–99)
Potassium: 4.2 mmol/L (ref 3.5–5.1)
Sodium: 127 mmol/L — ABNORMAL LOW (ref 135–145)

## 2022-08-21 LAB — CEA (ACCESS): CEA (CHCC): 159.25 ng/mL — ABNORMAL HIGH (ref 0.00–5.00)

## 2022-08-21 MED ORDER — HEPARIN SOD (PORK) LOCK FLUSH 100 UNIT/ML IV SOLN
500.0000 [IU] | Freq: Once | INTRAVENOUS | Status: AC | PRN
  Administered 2022-08-21: 500 [IU]

## 2022-08-21 MED ORDER — SODIUM CHLORIDE 0.9% FLUSH
10.0000 mL | INTRAVENOUS | Status: DC | PRN
  Administered 2022-08-21: 10 mL

## 2022-08-21 NOTE — Patient Instructions (Signed)

## 2022-08-21 NOTE — Progress Notes (Signed)
[name] OFFICE PROGRESS NOTE   Diagnosis: Colon cancer  INTERVAL HISTORY:   Jeffrey Young returns as scheduled.  Overall feels well.  Appetite and energy vary.  No change in bowel habits.  He did not take the nighttime dose of insulin yesterday.  He states he "fell asleep".  Objective:  Vital signs in last 24 hours:  Blood pressure (!) 160/96, pulse 82, temperature 98.1 F (36.7 C), temperature source Oral, resp. rate 18, height 6\' 1"  (1.854 m), weight 205 lb (93 kg), SpO2 100 %.    Lymphatics: No palpable cervical, supraclavicular, axillary or inguinal lymph nodes. Resp: Lungs clear bilaterally. Cardio: Regular rate and rhythm. GI: Abdomen soft and nontender.  No hepatosplenomegaly. Vascular: Left lower leg is larger than the right lower leg (chronic). Port-A-Cath without erythema.  Lab Results:  Lab Results  Component Value Date   WBC 7.9 07/07/2022   HGB 11.0 (L) 07/07/2022   HCT 31.7 (L) 07/07/2022   MCV 88.3 07/07/2022   PLT 241 07/07/2022   NEUTROABS 5.7 07/07/2022    Imaging:  No results found.  Medications: I have reviewed the patient's current medications.  Assessment/Plan: Descending colon cancer, stage IIIc (T3N2b M0), status post a partial left colectomy 07/30/2020, 9/16 lymph nodes positive, lymphovascular invasion, 1 satellite nodule, negative margins, MSS, no loss of mismatch repair protein expression -History of large polyp in the left side of the colon-referred to [facility] in 05/2018 for procedure canceled secondary to COVID-19 pandemic.  Procedure was not rescheduled. -CT chest/abdomen/pelvis with contrast 07/27/2020-3 small pulmonary nodules less than 5 mm favored to be benign, circumferential luminal narrowing of the distal transverse colon concerning for malignancy, no metastatic adenopathy in the mesentery porta hepatis, no for metastasis. -CEA on 07/27/2020 was 17.3; 37 on 08/30/2020; 33 on 09/13/2020 -Colonoscopy performed 07/27/2020 showed a  fungating, infiltrative and ulcerated nonobstructing large mass in the proximal descending colon.  Biopsy-adenocarcinoma -Cycle 1 FOLFOX 08/30/2020 -Cycle 2 FOLFOX 09/13/2020, Emend added for delayed nausea -Cycle 3 FOLFOX 09/27/2020 -Cycle 4 FOLFOX 10/11/2020 -Cycle 5 FOLFOX 11/08/2020 -Cycle 6 FOLFOX 11/23/2020 -CT 12/03/2020-prior 3 mm left apical nodule no longer seen, no new/suspicious pulmonary nodules, no evidence of metastatic disease -Cycle 7 FOLFOX 12/06/2020 -Cycle 8 FOLFOX 12/21/2020 -Cycle 9 FOLFOX 01/03/2021 -Cycle 10 FOLFOX 01/17/2021 -Cycle 11 FOLFOX 01/31/2021, oxaliplatin held secondary to neuropathy symptoms -Cycle 12 FOLFOX 02/15/2021, oxaliplatin held secondary to neuropathy -CT abdomen/pelvis 04/02/2021-no evidence of recurrent colon cancer -CT chest 04/08/2021-no evidence of metastatic disease -Elevated CEA February and March 2023 -08/17/2021 PET scan-no evidence of local recurrence or metastasis -09/26/2021-Guardant-ctDNA detected -Colonoscopy 10/04/2021-negative -CT 11/17/2021-no evidence of metastatic disease -CTs 01/08/2022-no evidence of metastatic disease -PET scan 08/10/2022-several newly enlarged lymph nodes with moderate metabolic activity 2.  Anemia due to GI bleeding, iron deficiency?,  Renal insufficiency? 3.  New onset acute diastolic CHF March [id number] 4.  Diabetes mellitus 5.  Renal insufficiency 6.  Hypertension 7.  History of left transmetatarsal amputation 8.  History of colon polyps 9.  Neuropathy 10.  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  We reviewed the PET scan results/images with him and his wife at today's visit.  They understand several lymph nodes demonstrate increased metabolic activity concerning for malignancy.  He remains asymptomatic.  Plan for continued observation with repeat CT scans in about 3 months.  We  discussed the markedly elevated blood sugar.  He did not take insulin last night.  He will resume insulin and monitor his blood sugar closely.  He understands to contact his PCP if the blood sugar remains elevated.  He will return for CEA, port flush and follow-up in 6 weeks.  We are available to see him sooner if needed.  Patient seen with Dr. Benay Spice.    Ned Card ANP/GNP-BC   08/21/2022  12:04 PM This was a shared visit with Ned Card.  Jeffrey Young was interviewed and examined.  We reviewed the PET findings and images with Jeffrey Young and his wife.  The PET is consistent with metastatic colon cancer involving multiple small lymph nodes.  He appears asymptomatic.  The metastatic disease is not resectable. He appears to have indolent metastatic colon cancer.  We will consider a diagnostic biopsy and initiating systemic therapy if he develops symptomatic or more rapid progression.  We will submit the original tumor for Foundation 1 testing.  I was present for greater than 50% of today's visit.  I performed medical decision making.  Julieanne Manson, MD

## 2022-08-22 ENCOUNTER — Encounter: Payer: Self-pay | Admitting: *Deleted

## 2022-08-22 NOTE — Progress Notes (Signed)
Foundation one testing requested on [id number]

## 2022-08-22 NOTE — Progress Notes (Signed)
Faxed 4/1 office note/labs to [facility] [phone]

## 2022-08-31 ENCOUNTER — Encounter (HOSPITAL_COMMUNITY): Payer: Self-pay | Admitting: Oncology

## 2022-09-11 ENCOUNTER — Ambulatory Visit (INDEPENDENT_AMBULATORY_CARE_PROVIDER_SITE_OTHER): Payer: 59 | Admitting: Orthopedic Surgery

## 2022-09-11 DIAGNOSIS — Z89432 Acquired absence of left foot: Secondary | ICD-10-CM

## 2022-09-12 ENCOUNTER — Encounter: Payer: Self-pay | Admitting: Orthopedic Surgery

## 2022-09-12 NOTE — Progress Notes (Signed)
Office Visit Note   Patient: Jeffrey Young           Date of Birth: 1965/07/14           MRN: [id number] Visit Date: 09/11/2022              Requested by: Tracey Harries, MD [address] [place],  [place] [address] PCP: Tracey Harries, MD  Chief Complaint  Patient presents with   Left Foot - Wound Check    Hx transmet amputation      HPI: Patient is a 57 year old gentleman who is seen in follow-up for Wagner grade 1 ulcer with a transmetatarsal amputation on the left.  [facility]  Assessment & Plan: Visit Diagnoses:  1. History of transmetatarsal amputation of left foot     Plan: With the worsening of the ulceration despite conservative treatment discussed that patient will be permanently disabled from working on his feet.  Patient needs to increase his pressure offloading.  Follow-Up Instructions: No follow-ups on file.   Ortho Exam  Patient is alert, oriented, no adenopathy, well-dressed, normal affect, normal respiratory effort. Examination patient has increased size of the plantar ulcer left foot.  There is no ischemic changes.  After informed consent a 10 blade knife was used to debride the skin and soft tissue back to bleeding viable granulation tissue.  There is no exposed bone or tendon.  The ulcer is 4 x 5 cm.  This was touched with silver nitrate for hemostasis.  The importance of pressure offloading was reinforced.  There is no cellulitis the ulcer is flat.  Imaging: No results found. No images are attached to the encounter.  Labs: Lab Results  Component Value Date   HGBA1C 11.4 (H) 04/02/2021   HGBA1C 6.7 (H) 07/29/2020   HGBA1C 7.2 (H) 07/25/2020   ESRSEDRATE 121 (H) 01/23/2018   CRP 21.4 (H) 01/23/2018   REPTSTATUS 04/03/2021 FINAL 04/01/2021   GRAMSTAIN  01/22/2018    RARE WBC PRESENT, PREDOMINANTLY PMN MODERATE GRAM POSITIVE COCCI MODERATE GRAM  NEGATIVE RODS FEW GRAM POSITIVE RODS Performed at [facility], [address] [place], [place] [address]    CULT  04/01/2021    NO GROWTH Performed at [facility], [address] [place], [place] [address]    LABORGA PROTEUS MIRABILIS 01/22/2018     Lab Results  Component Value Date   ALBUMIN 3.1 (L) 07/07/2022   ALBUMIN 2.6 (L) 04/03/2021   ALBUMIN 3.4 (L) 04/01/2021   PREALBUMIN 6.2 (L) 01/23/2018    Lab Results  Component Value Date   MG 1.8 04/13/2021   MG 1.8 04/12/2021   MG 1.8 04/11/2021   No results found for: "VD25OH"  Lab Results  Component Value Date   PREALBUMIN 6.2 (L) 01/23/2018      Latest Ref Rng & Units 07/07/2022    8:08 AM 04/13/2021    3:45 AM 04/12/2021    4:25 AM  CBC EXTENDED  WBC 4.0 - 10.5 K/uL 7.9  6.2  6.0   RBC 4.22 - 5.81 MIL/uL 3.59  2.61  2.75   Hemoglobin 13.0 - 17.0 g/dL 86.5  8.3  8.8   HCT 78.4 - 52.0 % 31.7  25.4  26.9   Platelets 150 - 400 K/uL 241  190  209   NEUT# 1.7 - 7.7 K/uL 5.7     Lymph# 0.7 - 4.0  K/uL 1.4        There is no height or weight on file to calculate BMI.  Orders:  No orders of the defined types were placed in this encounter.  No orders of the defined types were placed in this encounter.    Procedures: No procedures performed  Clinical Data: No additional findings.  ROS:  All other systems negative, except as noted in the HPI. Review of Systems  Objective: Vital Signs: There were no vitals taken for this visit.  Specialty Comments:  No specialty comments available.  PMFS History: Patient Active Problem List   Diagnosis Date Noted   Malignant neoplasm of colon    GI bleed 04/02/2021   Severe sepsis 04/02/2021   AKI (acute kidney injury) 04/02/2021   Acute metabolic encephalopathy 04/02/2021   Hypokalemia 04/02/2021   Encephalopathy    Nausea and vomiting    Coffee ground emesis    Elevated bilirubin    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  07/31/2020   IDA (iron deficiency anemia) from bleeding colon cancer 07/31/2020   Diabetic retinopathy 07/31/2020   Hyperlipidemia 07/31/2020   Low back pain 07/31/2020   Shortness of breath 07/31/2020   Systolic heart failure 07/31/2020   CKD (chronic kidney disease) stage 3, GFR 30-59 ml/min 07/31/2020   Insulin-requiring or dependent type II diabetes mellitus 07/31/2020   ARF (acute renal failure) 07/25/2020   Symptomatic anemia    New onset of congestive heart failure 07/24/2020   GIB (gastrointestinal bleeding) 07/24/2020   Hypertension associated with diabetes 06/07/2019   Erectile dysfunction associated with type 2 diabetes mellitus 05/08/2019   Sepsis 01/22/2018   Diabetic ulcer of left foot 01/22/2018   Status post transmetatarsal amputation of left foot 01/22/2018   Diabetic mononeuropathy associated with type 2 diabetes mellitus 03/21/2017   Microalbuminuria due to type 2 diabetes mellitus 03/21/2017   Vitamin D deficiency 04/06/2009   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 Type 2 Diabetes Mellitus - Uncomplicated, Uncontrolled    Family History  Problem Relation Age of Onset   Hypertension Father     Past Surgical History:  Procedure Laterality Date   AMPUTATION Left 01/22/2018   Procedure: TRANSMETATARSAL AMPUTATION;  Surgeon: Nadara Mustard, MD;  Location: [facility] OR;  Service: Orthopedics;  Laterality: Left;toes   BIOPSY  07/27/2020   Procedure: BIOPSY;  Surgeon: Jeani Hawking, MD;  Location: [facility];  Service: Endoscopy;;   COLON RESECTION N/A 07/30/2020   Procedure: HAND ASSISTED LAPAROSCOPIC LEFT HEMI COLECTOMY;  Surgeon: Luretha Murphy, MD;  Location: [facility] ORS;  Service: General;  Laterality: N/A;   COLONOSCOPY WITH PROPOFOL N/A 05/24/2018   Procedure: COLONOSCOPY WITH PROPOFOL;  Surgeon: Jeani Hawking, MD;  Location: [facility];  Service: Endoscopy;  Laterality: N/A;   COLONOSCOPY WITH PROPOFOL N/A 07/27/2020   Procedure: COLONOSCOPY WITH PROPOFOL;  Surgeon: Jeani Hawking, MD;  Location: [facility];  Service: Endoscopy;  Laterality: N/A;   ESOPHAGOGASTRODUODENOSCOPY Left 04/05/2021   Procedure: ESOPHAGOGASTRODUODENOSCOPY (EGD);  Surgeon: Jeani Hawking, MD;  Location: [facility];  Service: Endoscopy;  Laterality: Left;   POLYPECTOMY  05/24/2018   Procedure: POLYPECTOMY;  Surgeon: Jeani Hawking, MD;  Location: [facility];  Service: Endoscopy;;   PORTACATH PLACEMENT Left 08/24/2020   Procedure: INSERTION PORT-A-CATH;  Surgeon: Luretha Murphy, MD;  Location: [facility];  Service: General;  Laterality: Left;  75/rm1   SUBMUCOSAL TATTOO INJECTION  07/27/2020   Procedure: SUBMUCOSAL TATTOO INJECTION;  Surgeon: Jeani Hawking, MD;  Location: [facility];  Service: Endoscopy;;   Social History   Occupational History   Not on file   Tobacco Use   Smoking status: Never   Smokeless tobacco: Never  Vaping Use   Vaping Use: Never used  Substance and Sexual Activity   Alcohol use: Yes    Alcohol/week: 1.0 standard drink of alcohol    Types: 1 Cans of beer per week    Comment: occasional   Drug use: Never   Sexual activity: Yes

## 2022-10-02 ENCOUNTER — Encounter: Payer: Self-pay | Admitting: Nurse Practitioner

## 2022-10-02 ENCOUNTER — Telehealth: Payer: Self-pay | Admitting: Nurse Practitioner

## 2022-10-02 ENCOUNTER — Inpatient Hospital Stay (HOSPITAL_BASED_OUTPATIENT_CLINIC_OR_DEPARTMENT_OTHER): Payer: 59 | Admitting: Nurse Practitioner

## 2022-10-02 ENCOUNTER — Inpatient Hospital Stay: Payer: 59 | Attending: Oncology

## 2022-10-02 ENCOUNTER — Inpatient Hospital Stay: Payer: 59

## 2022-10-02 VITALS — BP 163/96 | HR 70 | Temp 97.3°F | Resp 18 | Wt 208.1 lb

## 2022-10-02 DIAGNOSIS — E119 Type 2 diabetes mellitus without complications: Secondary | ICD-10-CM | POA: Diagnosis not present

## 2022-10-02 DIAGNOSIS — Z79899 Other long term (current) drug therapy: Secondary | ICD-10-CM | POA: Insufficient documentation

## 2022-10-02 DIAGNOSIS — T451X5D Adverse effect of antineoplastic and immunosuppressive drugs, subsequent encounter: Secondary | ICD-10-CM | POA: Insufficient documentation

## 2022-10-02 DIAGNOSIS — C189 Malignant neoplasm of colon, unspecified: Secondary | ICD-10-CM | POA: Diagnosis not present

## 2022-10-02 DIAGNOSIS — I110 Hypertensive heart disease with heart failure: Secondary | ICD-10-CM | POA: Diagnosis not present

## 2022-10-02 DIAGNOSIS — N289 Disorder of kidney and ureter, unspecified: Secondary | ICD-10-CM | POA: Insufficient documentation

## 2022-10-02 DIAGNOSIS — C186 Malignant neoplasm of descending colon: Secondary | ICD-10-CM | POA: Insufficient documentation

## 2022-10-02 DIAGNOSIS — R918 Other nonspecific abnormal finding of lung field: Secondary | ICD-10-CM | POA: Diagnosis not present

## 2022-10-02 DIAGNOSIS — I5032 Chronic diastolic (congestive) heart failure: Secondary | ICD-10-CM | POA: Insufficient documentation

## 2022-10-02 DIAGNOSIS — Z8719 Personal history of other diseases of the digestive system: Secondary | ICD-10-CM | POA: Diagnosis not present

## 2022-10-02 DIAGNOSIS — G620 Drug-induced polyneuropathy: Secondary | ICD-10-CM | POA: Diagnosis not present

## 2022-10-02 DIAGNOSIS — Z9049 Acquired absence of other specified parts of digestive tract: Secondary | ICD-10-CM | POA: Diagnosis not present

## 2022-10-02 LAB — CEA (ACCESS): CEA (CHCC): 74.81 ng/mL — ABNORMAL HIGH (ref 0.00–5.00)

## 2022-10-02 MED ORDER — SODIUM CHLORIDE 0.9% FLUSH
10.0000 mL | INTRAVENOUS | Status: DC | PRN
  Administered 2022-10-02: 10 mL

## 2022-10-02 MED ORDER — HEPARIN SOD (PORK) LOCK FLUSH 100 UNIT/ML IV SOLN
500.0000 [IU] | Freq: Once | INTRAVENOUS | Status: AC | PRN
  Administered 2022-10-02: 500 [IU]

## 2022-10-02 NOTE — Progress Notes (Addendum)
[facility] OFFICE PROGRESS NOTE   Diagnosis: Colon cancer  INTERVAL HISTORY:   Jeffrey Young returns as scheduled.  He reports continued numbness/tingling in the hands and feet.  Symptoms cause difficulty buttoning shirt, tying shoes, holding onto objects (specifically "nuts and bolts"), removing the cap from a bottle or jar, balance issues.  Appetite varies.  Bowels are moving.  No bleeding.  No urinary symptoms.  Objective:  Vital signs in last 24 hours:  Blood pressure (!) 163/96, pulse 70, temperature (!) 97.3 F (36.3 C), temperature source Tympanic, resp. rate 18, weight 208 lb 1.6 oz (94.4 kg), SpO2 100 %.    HEENT: No thrush or ulcers. Lymphatics: No palpable cervical, supraclavicular or axillary lymph nodes. Resp: Lungs clear bilaterally. Cardio: Regular rate and rhythm. GI: No hepatosplenomegaly. Vascular: No leg edema. Port-A-Cath without erythema.  Lab Results:  Lab Results  Component Value Date   WBC 7.9 07/07/2022   HGB 11.0 (L) 07/07/2022   HCT 31.7 (L) 07/07/2022   MCV 88.3 07/07/2022   PLT 241 07/07/2022   NEUTROABS 5.7 07/07/2022    Imaging:  No results found.  Medications: I have reviewed the patient's current medications.  Assessment/Plan: Descending colon cancer, stage IIIc (T3N2b M0), status post a partial left colectomy 07/30/2020, 9/16 lymph nodes positive, lymphovascular invasion, 1 satellite nodule, negative margins, MSS, no loss of mismatch repair protein expression; foundation 1 K-ras wild-type, NRAS Q61H, microsatellite stable, tumor mutation burden 4. -History of large polyp in the left side of the colon-referred to [facility] in 05/2018 for procedure canceled secondary to COVID-19 pandemic.  Procedure was not rescheduled. -CT chest/abdomen/pelvis with contrast 07/27/2020-3 small pulmonary nodules less than 5 mm favored to be benign, circumferential luminal narrowing of the distal transverse colon concerning for malignancy, no  metastatic adenopathy in the mesentery porta hepatis, no for metastasis. -CEA on 07/27/2020 was 17.3; 37 on 08/30/2020; 33 on 09/13/2020 -Colonoscopy performed 07/27/2020 showed a fungating, infiltrative and ulcerated nonobstructing large mass in the proximal descending colon.  Biopsy-adenocarcinoma -Cycle 1 FOLFOX 08/30/2020 -Cycle 2 FOLFOX 09/13/2020, Emend added for delayed nausea -Cycle 3 FOLFOX 09/27/2020 -Cycle 4 FOLFOX 10/11/2020 -Cycle 5 FOLFOX 11/08/2020 -Cycle 6 FOLFOX 11/23/2020 -CT 12/03/2020-prior 3 mm left apical nodule no longer seen, no new/suspicious pulmonary nodules, no evidence of metastatic disease -Cycle 7 FOLFOX 12/06/2020 -Cycle 8 FOLFOX 12/21/2020 -Cycle 9 FOLFOX 01/03/2021 -Cycle 10 FOLFOX 01/17/2021 -Cycle 11 FOLFOX 01/31/2021, oxaliplatin held secondary to neuropathy symptoms -Cycle 12 FOLFOX 02/15/2021, oxaliplatin held secondary to neuropathy -CT abdomen/pelvis 04/02/2021-no evidence of recurrent colon cancer -CT chest 04/08/2021-no evidence of metastatic disease -Elevated CEA February and March 2023 -08/17/2021 PET scan-no evidence of local recurrence or metastasis -09/26/2021-Guardant-ctDNA detected -Colonoscopy 10/04/2021-negative -CT 11/17/2021-no evidence of metastatic disease -CTs 01/08/2022-no evidence of metastatic disease -PET scan 08/10/2022-several newly enlarged lymph nodes with moderate metabolic activity 2.  Anemia due to GI bleeding, iron deficiency?,  Renal insufficiency? 3.  New onset acute diastolic CHF March 2022 4.  Diabetes mellitus 5.  Renal insufficiency 6.  Hypertension 7.  History of left transmetatarsal amputation 8.  History of colon polyps 9.  Neuropathy 10.  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  There is no clinical evidence of disease progression.  He will have repeat CT scans in approximately 5-6  weeks.  Follow-up visit a few days later to review the results.    Misty Stanley  Karter Haire ANP/GNP-BC   10/02/2022  2:02 PM

## 2022-10-10 ENCOUNTER — Ambulatory Visit (INDEPENDENT_AMBULATORY_CARE_PROVIDER_SITE_OTHER): Payer: 59 | Admitting: Orthopedic Surgery

## 2022-10-10 DIAGNOSIS — Z89432 Acquired absence of left foot: Secondary | ICD-10-CM | POA: Diagnosis not present

## 2022-10-10 DIAGNOSIS — L97521 Non-pressure chronic ulcer of other part of left foot limited to breakdown of skin: Secondary | ICD-10-CM | POA: Diagnosis not present

## 2022-10-10 DIAGNOSIS — M6702 Short Achilles tendon (acquired), left ankle: Secondary | ICD-10-CM

## 2022-10-11 ENCOUNTER — Encounter: Payer: Self-pay | Admitting: Orthopedic Surgery

## 2022-10-11 NOTE — Progress Notes (Signed)
Office Visit Note   Patient: Jeffrey Young           Date of Birth: [birth year]           MRN: [id number] Visit Date: 10/10/2022              Requested by: Tracey Harries, MD [address] [place],  [place] [address] PCP: Tracey Harries, MD  Chief Complaint  Patient presents with   Left Foot - Follow-up      HPI: Patient is a 57 year old gentleman who is status post left transmetatarsal amputation.  Patient presents complaining of a Wagner grade 1 ulcer.  Assessment & Plan: Visit Diagnoses:  1. History of transmetatarsal amputation of left foot (HCC)     Plan: Ulcer was debrided.  Patient was given instructions and demonstrated Achilles stretching.  Discussed that if he is unable to stretch out the Achilles on his own we could proceed with Achilles lengthening or gastrocnemius recession.  Discussed that the ulcer will not resolve without improving the dorsiflexion of the foot.  Follow-Up Instructions: No follow-ups on file.   Ortho Exam  Patient is alert, oriented, no adenopathy, well-dressed, normal affect, normal respiratory effort. Examination with the knee extended patient lacks 20 degrees of dorsiflexion to neutral.  With the knee flexed he has dorsiflexion past neutral.  Patient was given instructions and demonstrated Achilles stretching.  Patient has a Wagner grade 1 ulcer on the plantar aspect of the transmetatarsal amputation secondary to the equinus contracture.  After informed consent a 10 blade knife was used to debride the skin and soft tissue back to healthy viable tissue.  There is no abscess no drainage no exposed bone or tendon the ulcer measures 3 x 1.5 cm after debridement.  Imaging: No results found. No images are attached to the encounter.  Labs: Lab Results  Component Value Date   HGBA1C 11.4 (H) 04/02/2021   HGBA1C 6.7 (H) 07/29/2020   HGBA1C 7.2 (H) 07/25/2020   ESRSEDRATE 121 (H) 01/23/2018   CRP 21.4 (H) 01/23/2018   REPTSTATUS  04/03/2021 FINAL 04/01/2021   GRAMSTAIN  01/22/2018    RARE WBC PRESENT, PREDOMINANTLY PMN MODERATE GRAM POSITIVE COCCI MODERATE GRAM NEGATIVE RODS FEW GRAM POSITIVE RODS Performed at [facility], [address] [place], [place] [address]    CULT  04/01/2021    NO GROWTH Performed at [facility], [address] [place], [place] [address]    LABORGA PROTEUS MIRABILIS 01/22/2018     Lab Results  Component Value Date   ALBUMIN 3.1 (L) 07/07/2022   ALBUMIN 2.6 (L) 04/03/2021   ALBUMIN 3.4 (L) 04/01/2021   PREALBUMIN 6.2 (L) 01/23/2018    Lab Results  Component Value Date   MG 1.8 04/13/2021   MG 1.8 04/12/2021   MG 1.8 04/11/2021   No results found for: "VD25OH"  Lab Results  Component Value Date   PREALBUMIN 6.2 (L) 01/23/2018      Latest Ref Rng & Units 07/07/2022    8:08 AM 04/13/2021    3:45 AM 04/12/2021    4:25 AM  CBC EXTENDED  WBC 4.0 - 10.5 K/uL 7.9  6.2  6.0   RBC 4.22 - 5.81 MIL/uL 3.59  2.61  2.75   Hemoglobin 13.0 - 17.0 g/dL 86.5  8.3  8.8   HCT 78.4 - 52.0 % 31.7  25.4  26.9   Platelets 150 - 400 K/uL 241  190  209   NEUT#  1.7 - 7.7 K/uL 5.7     Lymph# 0.7 - 4.0 K/uL 1.4        There is no height or weight on file to calculate BMI.  Orders:  No orders of the defined types were placed in this encounter.  No orders of the defined types were placed in this encounter.    Procedures: No procedures performed  Clinical Data: No additional findings.  ROS:  All other systems negative, except as noted in the HPI. Review of Systems  Objective: Vital Signs: There were no vitals taken for this visit.  Specialty Comments:  No specialty comments available.  PMFS History: Patient Active Problem List   Diagnosis Date Noted   Malignant neoplasm of colon (HCC)    GI bleed 04/02/2021   Severe sepsis (HCC) 04/02/2021   AKI (acute kidney injury) (HCC) 04/02/2021   Acute metabolic encephalopathy 04/02/2021   Hypokalemia  04/02/2021   Encephalopathy    Nausea and vomiting    Coffee ground emesis    Elevated bilirubin    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 07/31/2020   IDA (iron deficiency anemia) from bleeding colon cancer 07/31/2020   Diabetic retinopathy (HCC) 07/31/2020   Hyperlipidemia 07/31/2020   Low back pain 07/31/2020   Shortness of breath 07/31/2020   Systolic heart failure (HCC) 07/31/2020   CKD (chronic kidney disease) stage 3, GFR 30-59 ml/min (HCC) 07/31/2020   Insulin-requiring or dependent type II diabetes mellitus (HCC) 07/31/2020   ARF (acute renal failure) (HCC) 07/25/2020   Symptomatic anemia    New onset of congestive heart failure (HCC) 07/24/2020   GIB (gastrointestinal bleeding) 07/24/2020   Hypertension associated with diabetes (HCC) 06/07/2019   Erectile dysfunction associated with type 2 diabetes mellitus (HCC) 05/08/2019   Sepsis (HCC) 01/22/2018   Diabetic ulcer of left foot (HCC) 01/22/2018   Status post transmetatarsal amputation of left foot (HCC) 01/22/2018   Diabetic mononeuropathy associated with type 2 diabetes mellitus (HCC) 03/21/2017   Microalbuminuria due to type 2 diabetes mellitus (HCC) 03/21/2017   Vitamin D deficiency 04/06/2009   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 Type 2 Diabetes Mellitus - Uncomplicated, Uncontrolled    Family History  Problem Relation Age of Onset   Hypertension Father     Past Surgical History:  Procedure Laterality Date   AMPUTATION Left 01/22/2018  Procedure: TRANSMETATARSAL AMPUTATION;  Surgeon: Nadara Mustard, MD;  Location: [facility] OR;  Service: Orthopedics;  Laterality: Left;toes   BIOPSY  07/27/2020   Procedure: BIOPSY;  Surgeon: Jeani Hawking, MD;  Location: [facility];  Service: Endoscopy;;   COLON RESECTION N/A 07/30/2020   Procedure: HAND ASSISTED LAPAROSCOPIC LEFT HEMI COLECTOMY;  Surgeon: Luretha Murphy, MD;  Location: [facility];  Service: General;  Laterality: N/A;   COLONOSCOPY WITH PROPOFOL N/A 05/24/2018   Procedure: COLONOSCOPY WITH PROPOFOL;  Surgeon: Jeani Hawking, MD;  Location: [facility];  Service: Endoscopy;  Laterality: N/A;   COLONOSCOPY WITH PROPOFOL N/A 07/27/2020   Procedure: COLONOSCOPY WITH PROPOFOL;  Surgeon: Jeani Hawking, MD;  Location: [facility];  Service: Endoscopy;  Laterality: N/A;   ESOPHAGOGASTRODUODENOSCOPY Left 04/05/2021   Procedure: ESOPHAGOGASTRODUODENOSCOPY (EGD);  Surgeon: Jeani Hawking, MD;  Location: [facility];  Service: Endoscopy;  Laterality: Left;   POLYPECTOMY  05/24/2018   Procedure: POLYPECTOMY;  Surgeon: Jeani Hawking, MD;  Location: [facility];  Service: Endoscopy;;   PORTACATH PLACEMENT Left 08/24/2020   Procedure: INSERTION PORT-A-CATH;  Surgeon: Luretha Murphy, MD;   Location: [facility];  Service: General;  Laterality: Left;  75/rm1   SUBMUCOSAL TATTOO INJECTION  07/27/2020   Procedure: SUBMUCOSAL TATTOO INJECTION;  Surgeon: Jeani Hawking, MD;  Location: [facility];  Service: Endoscopy;;   Social History   Occupational History   Not on file  Tobacco Use   Smoking status: Never   Smokeless tobacco: Never  Vaping Use   Vaping Use: Never used  Substance and Sexual Activity   Alcohol use: Yes    Alcohol/week: 1.0 standard drink of alcohol    Types: 1 Cans of beer per week    Comment: occasional   Drug use: Never   Sexual activity: Yes

## 2022-10-13 ENCOUNTER — Encounter: Payer: Self-pay | Admitting: *Deleted

## 2022-10-13 NOTE — Progress Notes (Signed)
  Faxed 10/02/22 office note/labs to [facility] [phone]

## 2022-10-17 ENCOUNTER — Encounter: Payer: Self-pay | Admitting: *Deleted

## 2022-10-17 NOTE — Progress Notes (Signed)
Faxed 10/02/22 lab/office note to [facility] [phone]

## 2022-10-19 ENCOUNTER — Encounter: Payer: Self-pay | Admitting: *Deleted

## 2022-10-19 NOTE — Progress Notes (Signed)
Per order of Dr. 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

## 2022-10-25 LAB — SURGICAL PATHOLOGY

## 2022-10-30 ENCOUNTER — Encounter: Payer: Self-pay | Admitting: Oncology

## 2022-11-06 ENCOUNTER — Inpatient Hospital Stay: Payer: Commercial Managed Care - PPO

## 2022-11-06 ENCOUNTER — Ambulatory Visit (HOSPITAL_BASED_OUTPATIENT_CLINIC_OR_DEPARTMENT_OTHER)
Admission: RE | Admit: 2022-11-06 | Discharge: 2022-11-06 | Disposition: A | Discharge status: Home / Self Care | Payer: Commercial Managed Care - PPO | Source: Ambulatory Visit | Attending: Nurse Practitioner | Admitting: Nurse Practitioner

## 2022-11-06 ENCOUNTER — Inpatient Hospital Stay: Payer: Commercial Managed Care - PPO | Attending: Oncology

## 2022-11-06 DIAGNOSIS — Z85038 Personal history of other malignant neoplasm of large intestine: Secondary | ICD-10-CM | POA: Diagnosis present

## 2022-11-06 DIAGNOSIS — C189 Malignant neoplasm of colon, unspecified: Secondary | ICD-10-CM

## 2022-11-06 DIAGNOSIS — Z9221 Personal history of antineoplastic chemotherapy: Secondary | ICD-10-CM | POA: Diagnosis not present

## 2022-11-06 DIAGNOSIS — Z9049 Acquired absence of other specified parts of digestive tract: Secondary | ICD-10-CM | POA: Insufficient documentation

## 2022-11-06 DIAGNOSIS — E1122 Type 2 diabetes mellitus with diabetic chronic kidney disease: Secondary | ICD-10-CM | POA: Diagnosis not present

## 2022-11-06 DIAGNOSIS — I130 Hypertensive heart and chronic kidney disease with heart failure and stage 1 through stage 4 chronic kidney disease, or unspecified chronic kidney disease: Secondary | ICD-10-CM | POA: Insufficient documentation

## 2022-11-06 DIAGNOSIS — I5031 Acute diastolic (congestive) heart failure: Secondary | ICD-10-CM | POA: Insufficient documentation

## 2022-11-06 DIAGNOSIS — N189 Chronic kidney disease, unspecified: Secondary | ICD-10-CM | POA: Diagnosis not present

## 2022-11-06 LAB — CBC WITH DIFFERENTIAL (CANCER CENTER ONLY)
Abs Immature Granulocytes: 0.04 10*3/uL (ref 0.00–0.07)
Basophils Absolute: 0 10*3/uL (ref 0.0–0.1)
Basophils Relative: 1 %
Eosinophils Absolute: 0.2 10*3/uL (ref 0.0–0.5)
Eosinophils Relative: 3 %
HCT: 28.1 % — ABNORMAL LOW (ref 39.0–52.0)
Hemoglobin: 9.6 g/dL — ABNORMAL LOW (ref 13.0–17.0)
Immature Granulocytes: 1 %
Lymphocytes Relative: 24 %
Lymphs Abs: 1.8 10*3/uL (ref 0.7–4.0)
MCH: 30.9 pg (ref 26.0–34.0)
MCHC: 34.2 g/dL (ref 30.0–36.0)
MCV: 90.4 fL (ref 80.0–100.0)
Monocytes Absolute: 0.8 10*3/uL (ref 0.1–1.0)
Monocytes Relative: 10 %
Neutro Abs: 4.7 10*3/uL (ref 1.7–7.7)
Neutrophils Relative %: 61 %
Platelet Count: 215 10*3/uL (ref 150–400)
RBC: 3.11 MIL/uL — ABNORMAL LOW (ref 4.22–5.81)
RDW: 12.3 % (ref 11.5–15.5)
WBC Count: 7.6 10*3/uL (ref 4.0–10.5)
nRBC: 0 % (ref 0.0–0.2)

## 2022-11-06 LAB — BASIC METABOLIC PANEL - CANCER CENTER ONLY
Anion gap: 7 (ref 5–15)
BUN: 33 mg/dL — ABNORMAL HIGH (ref 6–20)
CO2: 24 mmol/L (ref 22–32)
Calcium: 8 mg/dL — ABNORMAL LOW (ref 8.9–10.3)
Chloride: 104 mmol/L (ref 98–111)
Creatinine: 3.53 mg/dL — ABNORMAL HIGH (ref 0.61–1.24)
GFR, Estimated: 19 mL/min — ABNORMAL LOW (ref >60)
Glucose, Bld: 204 mg/dL — ABNORMAL HIGH (ref 70–99)
Potassium: 3.4 mmol/L — ABNORMAL LOW (ref 3.5–5.1)
Sodium: 135 mmol/L (ref 135–145)

## 2022-11-06 LAB — CEA (ACCESS): CEA (CHCC): 85.4 ng/mL — ABNORMAL HIGH (ref 0.00–5.00)

## 2022-11-06 MED ORDER — HEPARIN SOD (PORK) LOCK FLUSH 100 UNIT/ML IV SOLN
500.0000 [IU] | Freq: Once | INTRAVENOUS | Status: AC
  Administered 2022-11-06: 500 [IU] via INTRAVENOUS

## 2022-11-06 NOTE — Patient Instructions (Signed)

## 2022-11-13 ENCOUNTER — Inpatient Hospital Stay: Payer: Commercial Managed Care - PPO | Admitting: Oncology

## 2022-11-13 ENCOUNTER — Telehealth: Payer: Self-pay | Admitting: Oncology

## 2022-11-13 ENCOUNTER — Ambulatory Visit: Payer: 59 | Admitting: Orthopedic Surgery

## 2022-11-13 NOTE — Telephone Encounter (Signed)
Patient wife called a to cancel appointment insurance has not kicked in yet will call back to reschedule .

## 2022-11-20 ENCOUNTER — Telehealth: Payer: Self-pay | Admitting: Oncology

## 2022-11-20 NOTE — Telephone Encounter (Signed)
Spoke with patient confirming upcoming appointment  

## 2022-11-27 ENCOUNTER — Ambulatory Visit: Payer: 59 | Admitting: Orthopedic Surgery

## 2022-12-01 ENCOUNTER — Encounter: Payer: Self-pay | Admitting: Oncology

## 2022-12-01 ENCOUNTER — Inpatient Hospital Stay: Payer: 59 | Attending: Oncology | Admitting: Oncology

## 2022-12-01 VITALS — BP 146/83 | HR 76 | Temp 98.2°F | Resp 20 | Ht 73.0 in | Wt 204.7 lb

## 2022-12-01 DIAGNOSIS — E119 Type 2 diabetes mellitus without complications: Secondary | ICD-10-CM | POA: Diagnosis not present

## 2022-12-01 DIAGNOSIS — G620 Drug-induced polyneuropathy: Secondary | ICD-10-CM | POA: Diagnosis not present

## 2022-12-01 DIAGNOSIS — C189 Malignant neoplasm of colon, unspecified: Secondary | ICD-10-CM | POA: Diagnosis not present

## 2022-12-01 DIAGNOSIS — C186 Malignant neoplasm of descending colon: Secondary | ICD-10-CM | POA: Diagnosis present

## 2022-12-01 DIAGNOSIS — N289 Disorder of kidney and ureter, unspecified: Secondary | ICD-10-CM | POA: Insufficient documentation

## 2022-12-01 DIAGNOSIS — Z86010 Personal history of colonic polyps: Secondary | ICD-10-CM | POA: Insufficient documentation

## 2022-12-01 DIAGNOSIS — I10 Essential (primary) hypertension: Secondary | ICD-10-CM | POA: Diagnosis not present

## 2022-12-01 DIAGNOSIS — T451X5A Adverse effect of antineoplastic and immunosuppressive drugs, initial encounter: Secondary | ICD-10-CM | POA: Insufficient documentation

## 2022-12-01 NOTE — Progress Notes (Signed)
Jeffrey Young OFFICE PROGRESS NOTE   Diagnosis: Colon cancer  INTERVAL HISTORY:   Jeffrey Young returns as scheduled.  He generally feels well.  Good appetite.  No difficulty with bowel function.  He has intermittent episodes of coughing.  This can occur up to a few times daily.  He continues to have numbness in the fingers and toes.  He has difficulty grasping objects.  He has noted balance difficulty due to foot numbness.  Objective:  Vital signs in last 24 hours:  Blood pressure (!) 146/83, pulse 76, temperature 98.2 F (36.8 C), temperature source Temporal, resp. rate 20, height 6\' 1"  (1.854 m), weight 204 lb 11.2 oz (92.9 kg), SpO2 100%.    Lymphatics: No cervical, supraclavicular, axillary, or inguinal nodes Resp: Lungs clear bilaterally Cardio: Regular rate and rhythm GI: No hepatosplenomegaly, no mass, nontender Vascular: The left lower leg is larger than the right side  Portacath/PICC-without erythema  Lab Results:  Lab Results  Component Value Date   WBC 7.6 11/06/2022   HGB 9.6 (L) 11/06/2022   HCT 28.1 (L) 11/06/2022   MCV 90.4 11/06/2022   PLT 215 11/06/2022   NEUTROABS 4.7 11/06/2022    CMP  Lab Results  Component Value Date   NA 135 11/06/2022   K 3.4 (L) 11/06/2022   CL 104 11/06/2022   CO2 24 11/06/2022   GLUCOSE 204 (H) 11/06/2022   BUN 33 (H) 11/06/2022   CREATININE 3.53 (H) 11/06/2022   CALCIUM 8.0 (L) 11/06/2022   PROT 6.9 07/07/2022   ALBUMIN 3.1 (L) 07/07/2022   AST 10 (L) 07/07/2022   ALT 6 07/07/2022   ALKPHOS 152 (H) 07/07/2022   BILITOT 0.3 07/07/2022   GFRNONAA 19 (L) 11/06/2022   GFRAA >60 01/26/2018    Lab Results  Component Value Date   CEA1 11.44 (H) 09/27/2020   CEA 85.40 (H) 11/06/2022     Medications: I have reviewed the patient's current medications.   Assessment/Plan: Descending colon cancer, stage IIIc (T3N2b M0), status post a partial left colectomy 07/30/2020, 9/16 lymph nodes positive,  lymphovascular invasion, 1 satellite nodule, negative margins, MSS, no loss of mismatch repair protein expression; foundation 1 K-ras wild-type, NRAS Q61H, microsatellite stable, tumor mutation burden 4. -History of large polyp in the left side of the colon-referred to [facility] in 05/2018 for procedure canceled secondary to COVID-19 pandemic.  Procedure was not rescheduled. -CT chest/abdomen/pelvis with contrast 07/27/2020-3 small pulmonary nodules less than 5 mm favored to be benign, circumferential luminal narrowing of the distal transverse colon concerning for malignancy, no metastatic adenopathy in the mesentery porta hepatis, no for metastasis. -CEA on 07/27/2020 was 17.3; 37 on 08/30/2020; 33 on 09/13/2020 -Colonoscopy performed 07/27/2020 showed a fungating, infiltrative and ulcerated nonobstructing large mass in the proximal descending colon.  Biopsy-adenocarcinoma -Cycle 1 FOLFOX 08/30/2020 -Cycle 2 FOLFOX 09/13/2020, Emend added for delayed nausea -Cycle 3 FOLFOX 09/27/2020 -Cycle 4 FOLFOX 10/11/2020 -Cycle 5 FOLFOX 11/08/2020 -Cycle 6 FOLFOX 11/23/2020 -CT 12/03/2020-prior 3 mm left apical nodule no longer seen, no new/suspicious pulmonary nodules, no evidence of metastatic disease -Cycle 7 FOLFOX 12/06/2020 -Cycle 8 FOLFOX 12/21/2020 -Cycle 9 FOLFOX 01/03/2021 -Cycle 10 FOLFOX 01/17/2021 -Cycle 11 FOLFOX 01/31/2021, oxaliplatin held secondary to neuropathy symptoms -Cycle 12 FOLFOX 02/15/2021, oxaliplatin held secondary to neuropathy -CT abdomen/pelvis 04/02/2021-no evidence of recurrent colon cancer -CT chest 04/08/2021-no evidence of metastatic disease -Elevated CEA February and March 2023 -08/17/2021 PET scan-no evidence of local recurrence or metastasis -09/26/2021-Guardant-ctDNA detected -Colonoscopy 10/04/2021-negative -CT 11/17/2021-no evidence  of metastatic disease -CTs 01/08/2022-no evidence of metastatic disease -PET scan 08/10/2022-several newly enlarged lymph nodes with moderate metabolic activity -CT  11/06/2022-stable left supraclavicular and retroperitoneal nodes, mild progression of pelvic adenopathy, new 6 mm segment 2 liver lesion 2.  Anemia due to GI bleeding, iron deficiency?,  Renal insufficiency? 3.  New onset acute diastolic CHF March 2022 4.  Diabetes mellitus 5.  Renal insufficiency 6.  Hypertension 7.  History of left transmetatarsal amputation 8.  History of colon polyps 9.  Neuropathy 10.  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  The CEA, there is detectable circulating tumor DNA, and a PET/CT scans are consistent with metastatic disease involving lymph nodes and possibly the liver.  He appears asymptomatic from the cancer at present.  He understands no therapy will be curative.  The plan is to continue observation with repeat imaging in 3 months.  We will initiate systemic therapy if he develops symptoms from the cancer or significant disease progression.  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  The neuropathy symptoms interfere with activities.  He appears to be a candidate for indefinite disability based on the metastatic colon cancer diagnosis.  He is scheduled to see nephrology for evaluation of renal insufficiency.  Jeffrey Young will return for a Port-A-Cath flush in 1 month.  He will be scheduled for an office visit in 3 months.  Thornton Papas, MD  12/01/2022  11:02 AM

## 2022-12-11 ENCOUNTER — Ambulatory Visit (INDEPENDENT_AMBULATORY_CARE_PROVIDER_SITE_OTHER): Payer: 59 | Admitting: Orthopedic Surgery

## 2022-12-11 ENCOUNTER — Encounter: Payer: Self-pay | Admitting: Orthopedic Surgery

## 2022-12-11 DIAGNOSIS — L97521 Non-pressure chronic ulcer of other part of left foot limited to breakdown of skin: Secondary | ICD-10-CM | POA: Diagnosis not present

## 2022-12-11 DIAGNOSIS — Z89432 Acquired absence of left foot: Secondary | ICD-10-CM | POA: Diagnosis not present

## 2022-12-11 NOTE — Progress Notes (Signed)
Office Visit Note   Patient: Jeffrey Young           Date of Birth: 10/01/1965           MRN: [id number] Visit Date: 12/11/2022              Requested by: Tracey Harries, MD [address] [place],  [place] [address] PCP: Tracey Harries, MD  Chief Complaint  Patient presents with   Left Foot - Wound Check    Hx left transmet amputation      HPI: Patient is a 57 year old gentleman who is status post left transmetatarsal amputation 2019.  Patient presents with a healing plantar ulcer.  Patient has no new extra-depth sandals custom orthotics with pressure offloading.  Assessment & Plan: Visit Diagnoses:  1. History of transmetatarsal amputation of left foot (HCC)     Plan: Continue daily dressing changes continue with his orthotics.  Follow-Up Instructions: No follow-ups on file.   Ortho Exam  Patient is alert, oriented, no adenopathy, well-dressed, normal affect, normal respiratory effort. Examination the ulcer is healing he has a palpable pulse.  After informed consent a 10 blade knife was used to debride the skin and soft tissue back to healthy viable bleeding granulation tissue.  There is no depth to the wound.  Wound measures 2 x 1.5 cm and 1 mm deep after debridement with healthy granulation tissue this was touched with silver nitrate 4 x 4 and Ace was applied.  Imaging: No results found. No images are attached to the encounter.  Labs: Lab Results  Component Value Date   HGBA1C 11.4 (H) 04/02/2021   HGBA1C 6.7 (H) 07/29/2020   HGBA1C 7.2 (H) 07/25/2020   ESRSEDRATE 121 (H) 01/23/2018   CRP 21.4 (H) 01/23/2018   REPTSTATUS 04/03/2021 FINAL 04/01/2021   GRAMSTAIN  01/22/2018    RARE WBC PRESENT, PREDOMINANTLY PMN MODERATE GRAM POSITIVE COCCI MODERATE GRAM NEGATIVE RODS FEW GRAM POSITIVE RODS Performed at [facility], [address] [place], [place] [address]    CULT  04/01/2021    NO GROWTH Performed at [facility], [address], [place], [place] [address]    LABORGA PROTEUS MIRABILIS 01/22/2018     Lab Results  Component Value Date   ALBUMIN 3.1 (L) 07/07/2022   ALBUMIN 2.6 (L) 04/03/2021   ALBUMIN 3.4 (L) 04/01/2021   PREALBUMIN 6.2 (L) 01/23/2018    Lab Results  Component Value Date   MG 1.8 04/13/2021   MG 1.8 04/12/2021   MG 1.8 04/11/2021   No results found for: "VD25OH"  Lab Results  Component Value Date   PREALBUMIN 6.2 (L) 01/23/2018      Latest Ref Rng & Units 11/06/2022    8:14 AM 07/07/2022    8:08 AM 04/13/2021    3:45 AM  CBC EXTENDED  WBC 4.0 - 10.5 K/uL 7.6  7.9  6.2   RBC 4.22 - 5.81 MIL/uL 3.11  3.59  2.61   Hemoglobin 13.0 - 17.0 g/dL 9.6  29.5  8.3   HCT 28.4 - 52.0 % 28.1  31.7  25.4   Platelets 150 - 400 K/uL 215  241  190   NEUT# 1.7 - 7.7 K/uL 4.7  5.7    Lymph# 0.7 - 4.0 K/uL 1.8  1.4       There is no height or weight on file to calculate BMI.  Orders:  No orders of the defined types were placed in this  encounter.  No orders of the defined types were placed in this encounter.    Procedures: No procedures performed  Clinical Data: No additional findings.  ROS:  All other systems negative, except as noted in the HPI. Review of Systems  Objective: Vital Signs: There were no vitals taken for this visit.  Specialty Comments:  No specialty comments available.  PMFS History: Patient Active Problem List   Diagnosis Date Noted   Malignant neoplasm of colon (HCC)    GI bleed 04/02/2021   Severe sepsis (HCC) 04/02/2021   AKI (acute kidney injury) (HCC) 04/02/2021   Acute metabolic encephalopathy 04/02/2021   Hypokalemia 04/02/2021   Encephalopathy    Nausea and vomiting    Coffee ground emesis    Elevated bilirubin    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 07/31/2020   IDA (iron deficiency anemia) from bleeding colon cancer 07/31/2020   Diabetic retinopathy (HCC) 07/31/2020   Hyperlipidemia 07/31/2020   Low back pain  07/31/2020   Shortness of breath 07/31/2020   Systolic heart failure (HCC) 07/31/2020   CKD (chronic kidney disease) stage 3, GFR 30-59 ml/min (HCC) 07/31/2020   Insulin-requiring or dependent type II diabetes mellitus (HCC) 07/31/2020   ARF (acute renal failure) (HCC) 07/25/2020   Symptomatic anemia    New onset of congestive heart failure (HCC) 07/24/2020   GIB (gastrointestinal bleeding) 07/24/2020   Hypertension associated with diabetes (HCC) 06/07/2019   Erectile dysfunction associated with type 2 diabetes mellitus (HCC) 05/08/2019   Sepsis (HCC) 01/22/2018   Diabetic ulcer of left foot (HCC) 01/22/2018   Status post transmetatarsal amputation of left foot (HCC) 01/22/2018   Diabetic mononeuropathy associated with type 2 diabetes mellitus (HCC) 03/21/2017   Microalbuminuria due to type 2 diabetes mellitus (HCC) 03/21/2017   Vitamin D deficiency 04/06/2009   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 Metatarsalgia  10/1 IMO update   Erectile dysfunction associated with type 2 diabetes mellitus (HCC) 05/08/2019   Hyperlipidemia 07/31/2020   Hypertension associated with diabetes (HCC) 06/07/2019   Microalbuminuria due to type 2 diabetes mellitus (HCC) 03/21/2017   Necrotizing fasciitis of ankle and foot (HCC) 01/22/2018   Necrotizing soft tissue infection    Status post transmetatarsal amputation of left foot (HCC) 01/22/2018   Systolic heart failure (HCC) 07/31/2020   Uncontrolled type 2 diabetes mellitus  with both eyes affected by severe nonproliferative retinopathy and macular edema, with long-term current use of insulin 04/02/2009   Formatting of this note might be different from the original. Type 2 Diabetes Mellitus - Uncomplicated, Uncontrolled    Family History  Problem Relation Age of Onset   Hypertension Father     Past Surgical History:  Procedure Laterality Date   AMPUTATION Left 01/22/2018   Procedure: TRANSMETATARSAL AMPUTATION;  Surgeon: Nadara Mustard, MD;  Location: [facility] OR;  Service: Orthopedics;  Laterality: Left;toes   BIOPSY  07/27/2020   Procedure: BIOPSY;  Surgeon: Jeani Hawking, MD;  Location: [facility];  Service: Endoscopy;;   COLON RESECTION  N/A 07/30/2020   Procedure: HAND ASSISTED LAPAROSCOPIC LEFT HEMI COLECTOMY;  Surgeon: Luretha Murphy, MD;  Location: [facility];  Service: General;  Laterality: N/A;   COLONOSCOPY WITH PROPOFOL N/A 05/24/2018   Procedure: COLONOSCOPY WITH PROPOFOL;  Surgeon: Jeani Hawking, MD;  Location: [facility];  Service: Endoscopy;  Laterality: N/A;   COLONOSCOPY WITH PROPOFOL N/A 07/27/2020   Procedure: COLONOSCOPY WITH PROPOFOL;  Surgeon: Jeani Hawking, MD;  Location: [facility];  Service: Endoscopy;  Laterality: N/A;   ESOPHAGOGASTRODUODENOSCOPY Left 04/05/2021   Procedure: ESOPHAGOGASTRODUODENOSCOPY (EGD);  Surgeon: Jeani Hawking, MD;  Location: [facility];  Service: Endoscopy;  Laterality: Left;   POLYPECTOMY  05/24/2018   Procedure: POLYPECTOMY;  Surgeon: Jeani Hawking, MD;  Location: [facility];  Service: Endoscopy;;   PORTACATH PLACEMENT Left 08/24/2020   Procedure: INSERTION PORT-A-CATH;  Surgeon: Luretha Murphy, MD;  Location: [facility];  Service: General;  Laterality: Left;  75/rm1   SUBMUCOSAL TATTOO INJECTION  07/27/2020   Procedure: SUBMUCOSAL TATTOO INJECTION;  Surgeon: Jeani Hawking, MD;  Location: [facility];  Service: Endoscopy;;   Social History   Occupational History   Not on file  Tobacco Use   Smoking status: Never    Smokeless tobacco: Never  Vaping Use   Vaping status: Never Used  Substance and Sexual Activity   Alcohol use: Yes    Alcohol/week: 1.0 standard drink of alcohol    Types: 1 Cans of beer per week    Comment: occasional   Drug use: Never   Sexual activity: Yes

## 2022-12-26 ENCOUNTER — Encounter: Payer: Self-pay | Admitting: Oncology

## 2023-01-02 ENCOUNTER — Inpatient Hospital Stay: Payer: 59 | Attending: Oncology

## 2023-01-02 ENCOUNTER — Encounter: Payer: Self-pay | Admitting: Oncology

## 2023-01-02 VITALS — BP 155/92 | HR 66 | Temp 97.9°F | Resp 18

## 2023-01-02 DIAGNOSIS — Z452 Encounter for adjustment and management of vascular access device: Secondary | ICD-10-CM | POA: Insufficient documentation

## 2023-01-02 DIAGNOSIS — Z85038 Personal history of other malignant neoplasm of large intestine: Secondary | ICD-10-CM | POA: Insufficient documentation

## 2023-01-02 DIAGNOSIS — C189 Malignant neoplasm of colon, unspecified: Secondary | ICD-10-CM

## 2023-01-02 MED ORDER — HEPARIN SOD (PORK) LOCK FLUSH 100 UNIT/ML IV SOLN
500.0000 [IU] | Freq: Once | INTRAVENOUS | Status: AC | PRN
  Administered 2023-01-02: 500 [IU]

## 2023-01-02 MED ORDER — SODIUM CHLORIDE 0.9% FLUSH
10.0000 mL | INTRAVENOUS | Status: DC | PRN
  Administered 2023-01-02: 10 mL

## 2023-01-02 NOTE — Patient Instructions (Signed)

## 2023-01-06 IMAGING — CT CT CHEST W/ CM
2 of 5 series · 13 of 36 positions shown, 16 images · IV contrast (APPLIED)
Comparison: None available

CLINICAL DATA: Colorectal cancer.  Rectal bleeding

EXAM:
CT CHEST, ABDOMEN, AND PELVIS WITH CONTRAST
TECHNIQUE: Multidetector CT imaging of the chest, abdomen and pelvis was
performed following the standard protocol during bolus
administration of intravenous contrast.
CONTRAST:  100mL OMNIPAQUE IOHEXOL 300 MG/ML  SOLN

[Series 2: cap with · axial · 0.93mm/px · z∈[-481,+69]mm · 10 of 136 slices shown, 13 images]
[im 13/136  mediastinal]
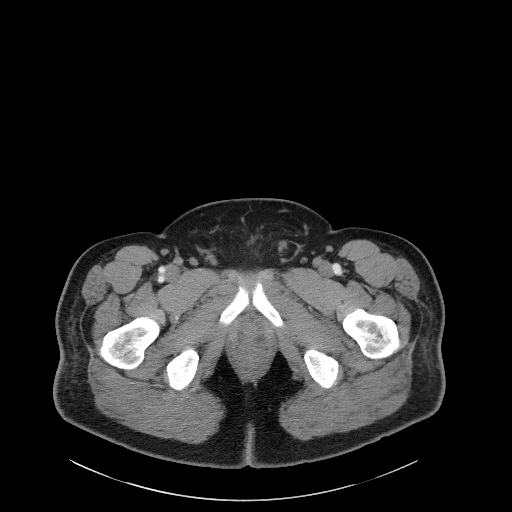
[im 13/136  lung]
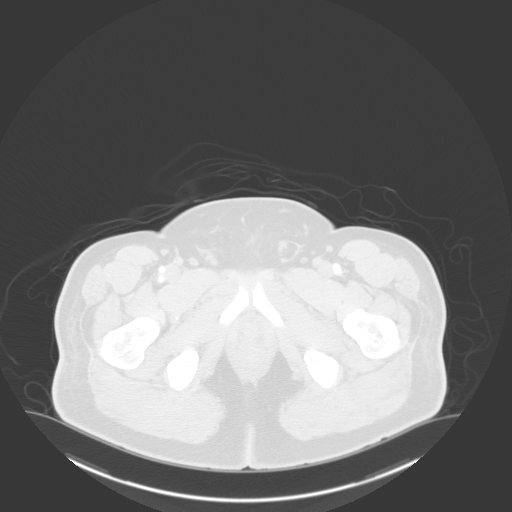
[im 25/136  lung]
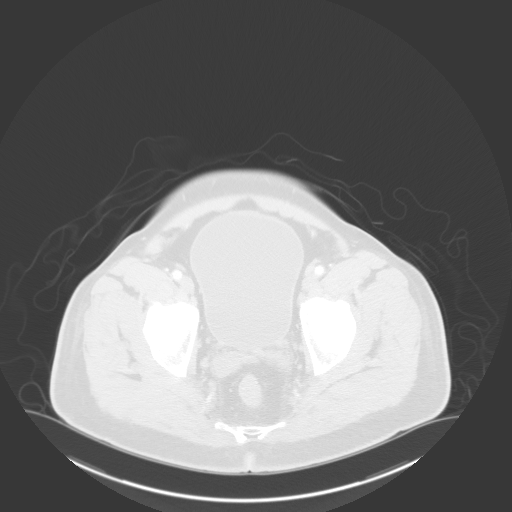
[im 37/136  lung]
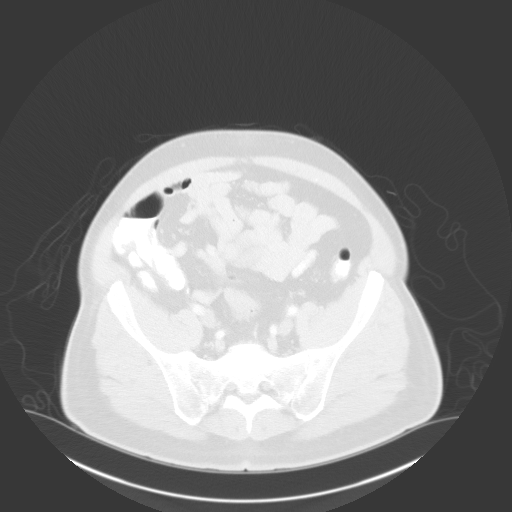
[im 50/136  lung]
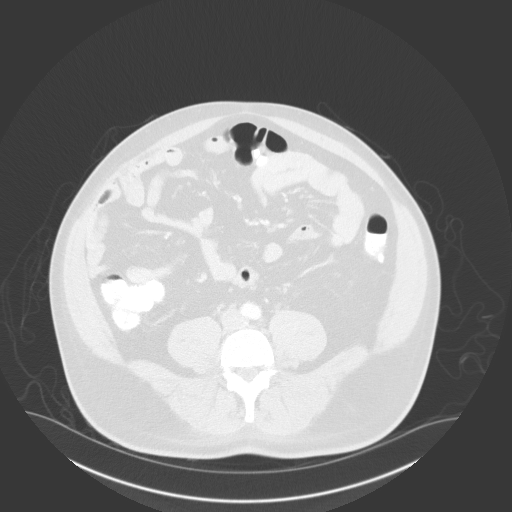
[im 62/136  mediastinal]
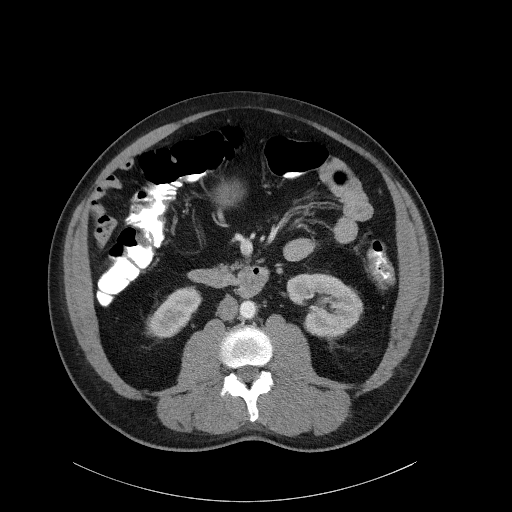
[im 62/136  lung]
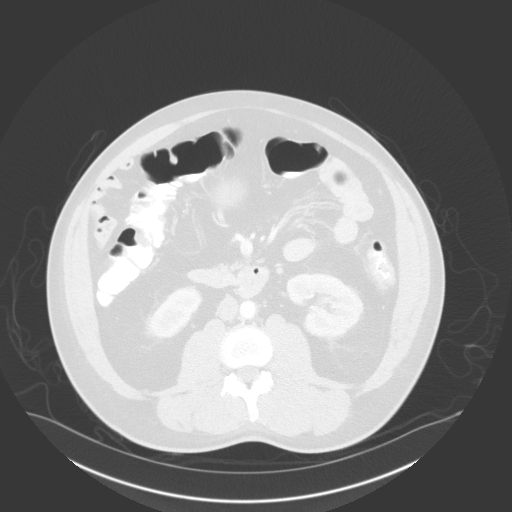
[im 74/136  lung]
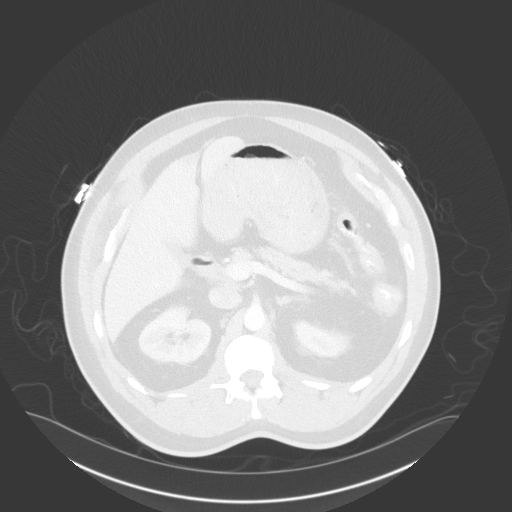
[im 86/136  lung]
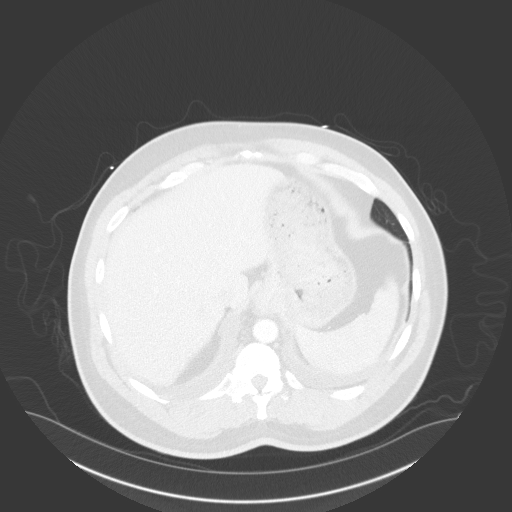
[im 99/136  lung]
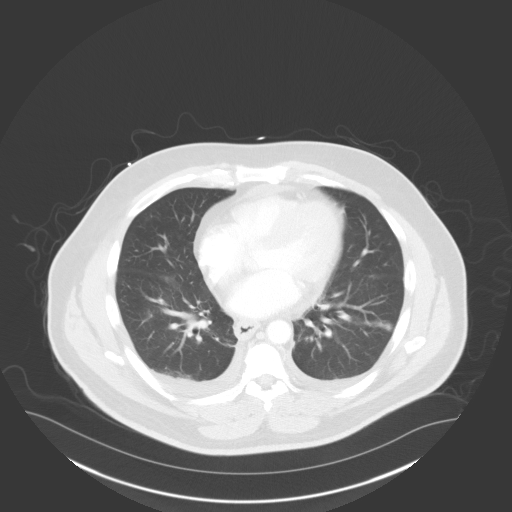
[im 111/136  mediastinal]
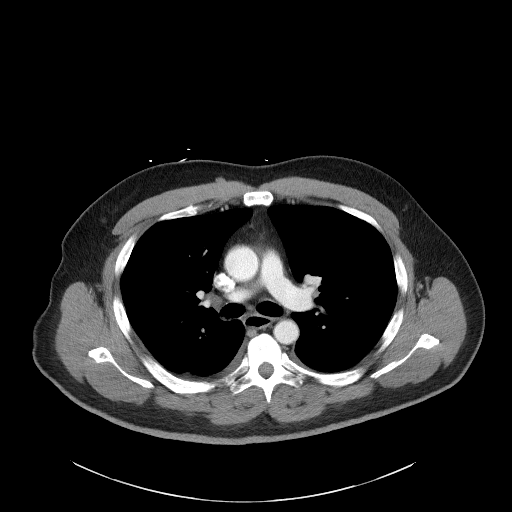
[im 111/136  lung]
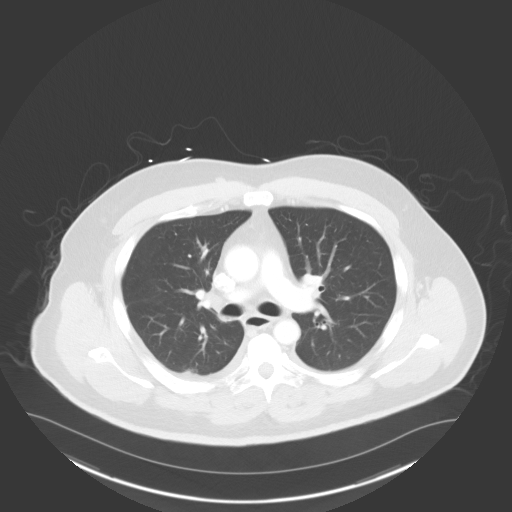
[im 123/136  lung]
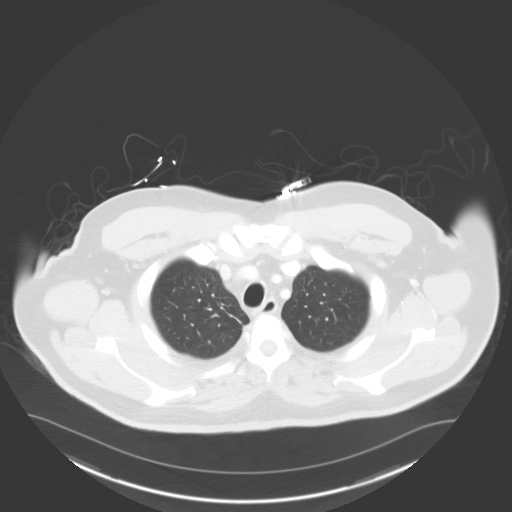

[Series 5: coronals · coronal · 0.82mm/px · 3 of 162 slices shown]
[im 33/162  lung]
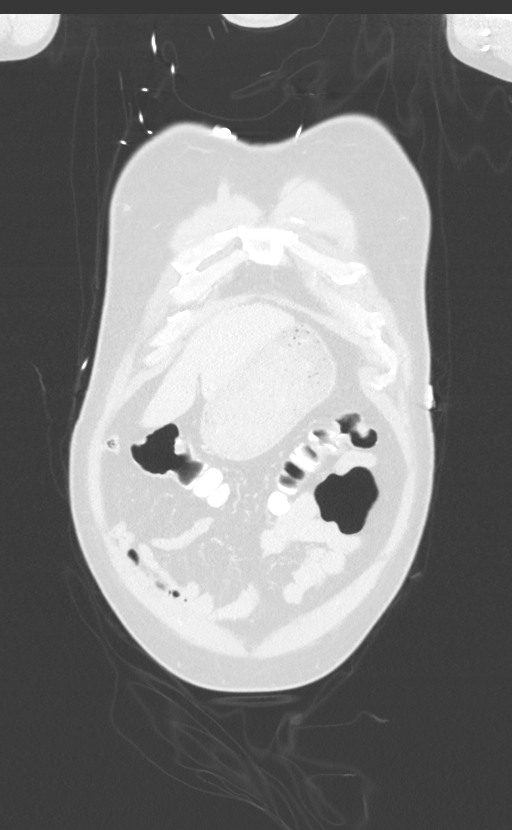
[im 65/162  lung]
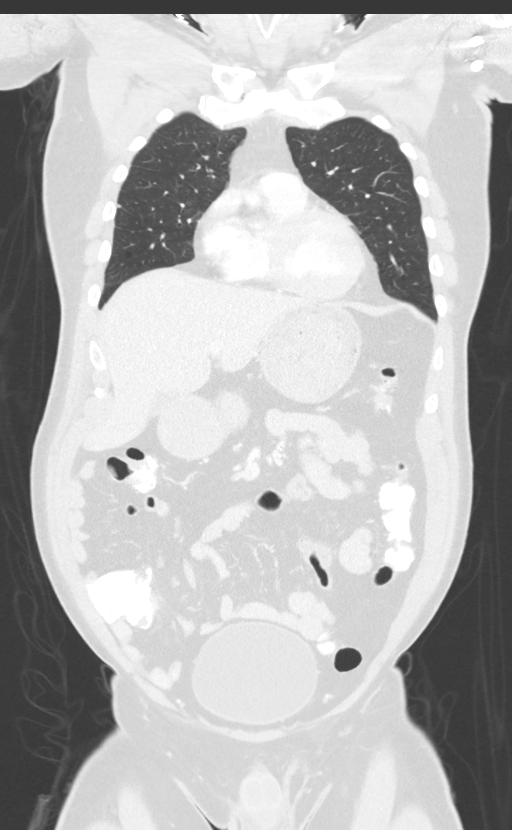
[im 97/162  lung]
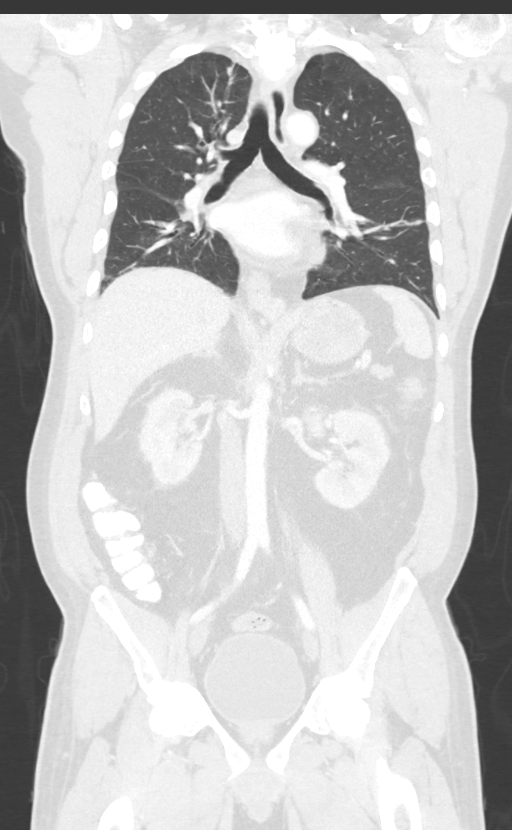

[13 of 36 positions shown; findings below may reference images not displayed]

FINDINGS: CT CHEST FINDINGS

Cardiovascular: No significant vascular findings. Normal heart size.
No pericardial effusion.

Mediastinum/Nodes: No axillary or supraclavicular adenopathy. No
mediastinal or hilar adenopathy. No pericardial fluid. Esophagus
normal.

Lungs/Pleura: Smudgy nodule in the LEFT lung apex measures 3 mm on
image 36/7). Linear scarring at the RIGHT lung apex with a nodular
focus measuring 3 mm on image [DATE]. Subpleural nodule in the RIGHT
upper lobe on image 80/7.

Small bilateral pleural effusions with passive atelectasis lung
bases.

Musculoskeletal: No aggressive osseous lesion.

CT ABDOMEN AND PELVIS FINDINGS

Hepatobiliary: No focal hepatic lesion. No biliary ductal
dilatation. Gallbladder is normal. Common bile duct is normal.

Pancreas: Pancreas is normal. No ductal dilatation. No pancreatic
inflammation.

Spleen: Normal spleen

Adrenals/urinary tract: Adrenal glands normal. Low-density lesion in
the RIGHT kidney is favored benign. Ureters and bladder normal

Stomach/Bowel: Small hiatal hernia. Stomach duodenum and small bowel
normal oral contrast transits to the ascending colon. The neck is
normal. Ascending transverse colon normal.

In the distal transverse colon there is a segment of circumferential
narrowing measuring 2 cm image 67/2. There is collapse of the colon
through the splenic flexure. Asymmetric mucosal thickening in the
descending proximal descending colon (image 73/2). Distal descending
colon and rectosigmoid colon normal.

No evidence abnormal lymph nodes within the colon mesentery. No
retroperitoneal nodes.

Vascular/Lymphatic: Abdominal aorta is normal caliber. There is no
retroperitoneal or periportal lymphadenopathy. No pelvic
lymphadenopathy.

Reproductive: Prostate normal

Other: No peritoneal disease

Musculoskeletal: No aggressive osseous lesion.
IMPRESSION: Chest Impression:

1. Three small pulmonary nodules less than 5 mm. Favor benign
nodules. Recommend correlation with tumor surgical pathology.
Recommend attention on follow-up.
2. No mediastinal adenopathy.

Abdomen / Pelvis Impression:

1. Circumferential luminal narrowing in the distal transverse colon
is nonspecific but may represent the malignancy cyst identified on
colonoscopy in the LEFT colon. Asymmetric thickening within the
descending colon is also noted.
2. No evidence of metastatic adenopathy in the mesentery porta
hepatis.
3. No liver metastasis.

## 2023-01-08 ENCOUNTER — Ambulatory Visit: Payer: 59 | Admitting: Orthopedic Surgery

## 2023-01-08 ENCOUNTER — Encounter: Payer: Self-pay | Admitting: Oncology

## 2023-01-08 DIAGNOSIS — Z89432 Acquired absence of left foot: Secondary | ICD-10-CM

## 2023-01-09 ENCOUNTER — Encounter: Payer: Self-pay | Admitting: Orthopedic Surgery

## 2023-01-09 NOTE — Progress Notes (Signed)
Office Visit Note   Patient: Jeffrey Young           Date of Birth: [birth year]           MRN: [id number] Visit Date: 01/08/2023              Requested by: Tracey Harries, MD [address] [place],  [place] [address] PCP: Tracey Harries, MD  Chief Complaint  Patient presents with   Left Foot - Routine Post Op    Hx left transmet amputation      HPI: Patient is a 57 year old gentleman who is seen in follow-up for left transmetatarsal amputation 2019.  Patient has a ulcer over the residual limb weightbearing in regular shoewear.  Assessment & Plan: Visit Diagnoses:  1. History of transmetatarsal amputation of left foot (HCC)     Plan: Patient will continue with his new custom orthotics in sandals this has made a significant improvement.  Continue routine wound care.  Follow-Up Instructions: Return in about 4 weeks (around 02/05/2023).   Ortho Exam  Patient is alert, oriented, no adenopathy, well-dressed, normal affect, normal respiratory effort. Examination patient is a strong palpable posterior tibial pulse.  He has a Wagner grade 1 ulcer beneath the transmetatarsal amputation.  After informed consent a 10 blade knife was used debride the skin and soft tissue back to healthy viable tissue.  The wound was 2 x 3 cm and 1 mm deep after debridement.  Sterile dressing was applied.  Imaging: No results found. No images are attached to the encounter.  Labs: Lab Results  Component Value Date   HGBA1C 11.4 (H) 04/02/2021   HGBA1C 6.7 (H) 07/29/2020   HGBA1C 7.2 (H) 07/25/2020   ESRSEDRATE 121 (H) 01/23/2018   CRP 21.4 (H) 01/23/2018   REPTSTATUS 04/03/2021 FINAL 04/01/2021   GRAMSTAIN  01/22/2018    RARE WBC PRESENT, PREDOMINANTLY PMN MODERATE GRAM POSITIVE COCCI MODERATE GRAM NEGATIVE RODS FEW GRAM POSITIVE RODS Performed at [facility], [address] [place], [place] [address]    CULT  04/01/2021    NO GROWTH Performed at [facility]  Lab, [address] [place], [place] [address]    LABORGA PROTEUS MIRABILIS 01/22/2018     Lab Results  Component Value Date   ALBUMIN 3.1 (L) 07/07/2022   ALBUMIN 2.6 (L) 04/03/2021   ALBUMIN 3.4 (L) 04/01/2021   PREALBUMIN 6.2 (L) 01/23/2018    Lab Results  Component Value Date   MG 1.8 04/13/2021   MG 1.8 04/12/2021   MG 1.8 04/11/2021   No results found for: "VD25OH"  Lab Results  Component Value Date   PREALBUMIN 6.2 (L) 01/23/2018      Latest Ref Rng & Units 11/06/2022    8:14 AM 07/07/2022    8:08 AM 04/13/2021    3:45 AM  CBC EXTENDED  WBC 4.0 - 10.5 K/uL 7.6  7.9  6.2   RBC 4.22 - 5.81 MIL/uL 3.11  3.59  2.61   Hemoglobin 13.0 - 17.0 g/dL 9.6  86.5  8.3   HCT 78.4 - 52.0 % 28.1  31.7  25.4   Platelets 150 - 400 K/uL 215  241  190   NEUT# 1.7 - 7.7 K/uL 4.7  5.7    Lymph# 0.7 - 4.0 K/uL 1.8  1.4       There is no height or weight on file to calculate BMI.  Orders:  No orders of the defined types were placed in  this encounter.  No orders of the defined types were placed in this encounter.    Procedures: No procedures performed  Clinical Data: No additional findings.  ROS:  All other systems negative, except as noted in the HPI. Review of Systems  Objective: Vital Signs: There were no vitals taken for this visit.  Specialty Comments:  No specialty comments available.  PMFS History: Patient Active Problem List   Diagnosis Date Noted   Malignant neoplasm of colon (HCC)    GI bleed 04/02/2021   Severe sepsis (HCC) 04/02/2021   AKI (acute kidney injury) (HCC) 04/02/2021   Acute metabolic encephalopathy 04/02/2021   Hypokalemia 04/02/2021   Encephalopathy    Nausea and vomiting    Coffee ground emesis    Elevated bilirubin    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 07/31/2020   IDA (iron deficiency anemia) from bleeding colon cancer 07/31/2020   Diabetic retinopathy (HCC) 07/31/2020   Hyperlipidemia 07/31/2020   Low back  pain 07/31/2020   Shortness of breath 07/31/2020   Systolic heart failure (HCC) 07/31/2020   CKD (chronic kidney disease) stage 3, GFR 30-59 ml/min (HCC) 07/31/2020   Insulin-requiring or dependent type II diabetes mellitus (HCC) 07/31/2020   ARF (acute renal failure) (HCC) 07/25/2020   Symptomatic anemia    New onset of congestive heart failure (HCC) 07/24/2020   GIB (gastrointestinal bleeding) 07/24/2020   Hypertension associated with diabetes (HCC) 06/07/2019   Erectile dysfunction associated with type 2 diabetes mellitus (HCC) 05/08/2019   Sepsis (HCC) 01/22/2018   Diabetic ulcer of left foot (HCC) 01/22/2018   Status post transmetatarsal amputation of left foot (HCC) 01/22/2018   Diabetic mononeuropathy associated with type 2 diabetes mellitus (HCC) 03/21/2017   Microalbuminuria due to type 2 diabetes mellitus (HCC) 03/21/2017   Vitamin D deficiency 04/06/2009   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 Metatarsalgia  10/1 IMO update   Erectile dysfunction associated with type 2 diabetes mellitus (HCC) 05/08/2019   Hyperlipidemia 07/31/2020   Hypertension associated with diabetes (HCC) 06/07/2019   Microalbuminuria due to type 2 diabetes mellitus (HCC) 03/21/2017   Necrotizing fasciitis of ankle and foot (HCC) 01/22/2018   Necrotizing soft tissue infection    Status post transmetatarsal amputation of left foot (HCC) 01/22/2018   Systolic heart failure (HCC) 07/31/2020   Uncontrolled type 2 diabetes  mellitus with both eyes affected by severe nonproliferative retinopathy and macular edema, with long-term current use of insulin 04/02/2009   Formatting of this note might be different from the original. Type 2 Diabetes Mellitus - Uncomplicated, Uncontrolled    Family History  Problem Relation Age of Onset   Hypertension Father     Past Surgical History:  Procedure Laterality Date   AMPUTATION Left 01/22/2018   Procedure: TRANSMETATARSAL AMPUTATION;  Surgeon: Nadara Mustard, MD;  Location: [facility] OR;  Service: Orthopedics;  Laterality: Left;toes   BIOPSY  07/27/2020   Procedure: BIOPSY;  Surgeon: Jeani Hawking, MD;  Location: [facility];  Service: Endoscopy;;   COLON  RESECTION N/A 07/30/2020   Procedure: HAND ASSISTED LAPAROSCOPIC LEFT HEMI COLECTOMY;  Surgeon: Luretha Murphy, MD;  Location: [facility];  Service: General;  Laterality: N/A;   COLONOSCOPY WITH PROPOFOL N/A 05/24/2018   Procedure: COLONOSCOPY WITH PROPOFOL;  Surgeon: Jeani Hawking, MD;  Location: [facility];  Service: Endoscopy;  Laterality: N/A;   COLONOSCOPY WITH PROPOFOL N/A 07/27/2020   Procedure: COLONOSCOPY WITH PROPOFOL;  Surgeon: Jeani Hawking, MD;  Location: [facility];  Service: Endoscopy;  Laterality: N/A;   ESOPHAGOGASTRODUODENOSCOPY Left 04/05/2021   Procedure: ESOPHAGOGASTRODUODENOSCOPY (EGD);  Surgeon: Jeani Hawking, MD;  Location: [facility];  Service: Endoscopy;  Laterality: Left;   POLYPECTOMY  05/24/2018   Procedure: POLYPECTOMY;  Surgeon: Jeani Hawking, MD;  Location: [facility];  Service: Endoscopy;;   PORTACATH PLACEMENT Left 08/24/2020   Procedure: INSERTION PORT-A-CATH;  Surgeon: Luretha Murphy, MD;  Location: [facility];  Service: General;  Laterality: Left;  75/rm1   SUBMUCOSAL TATTOO INJECTION  07/27/2020   Procedure: SUBMUCOSAL TATTOO INJECTION;  Surgeon: Jeani Hawking, MD;  Location: [facility];  Service: Endoscopy;;   Social History   Occupational History   Not on file  Tobacco Use   Smoking status:  Never   Smokeless tobacco: Never  Vaping Use   Vaping status: Never Used  Substance and Sexual Activity   Alcohol use: Yes    Alcohol/week: 1.0 standard drink of alcohol    Types: 1 Cans of beer per week    Comment: occasional   Drug use: Never   Sexual activity: Yes

## 2023-01-18 ENCOUNTER — Telehealth: Payer: Self-pay | Admitting: *Deleted

## 2023-01-18 NOTE — Telephone Encounter (Signed)
Notified Jeffrey Young of lab/flush/CT scan appointments on 10/14 and prep.

## 2023-01-30 ENCOUNTER — Encounter: Payer: Self-pay | Admitting: Oncology

## 2023-02-03 IMAGING — DX DG CHEST 1V PORT
1 series · 1 of 1 positions shown · non-contrast
Comparison: Chest x-ray 07/24/2020.

CLINICAL DATA: 55-year-old male status post Port-A-Cath placement.

EXAM:
PORTABLE CHEST 1 VIEW

[chest ap]
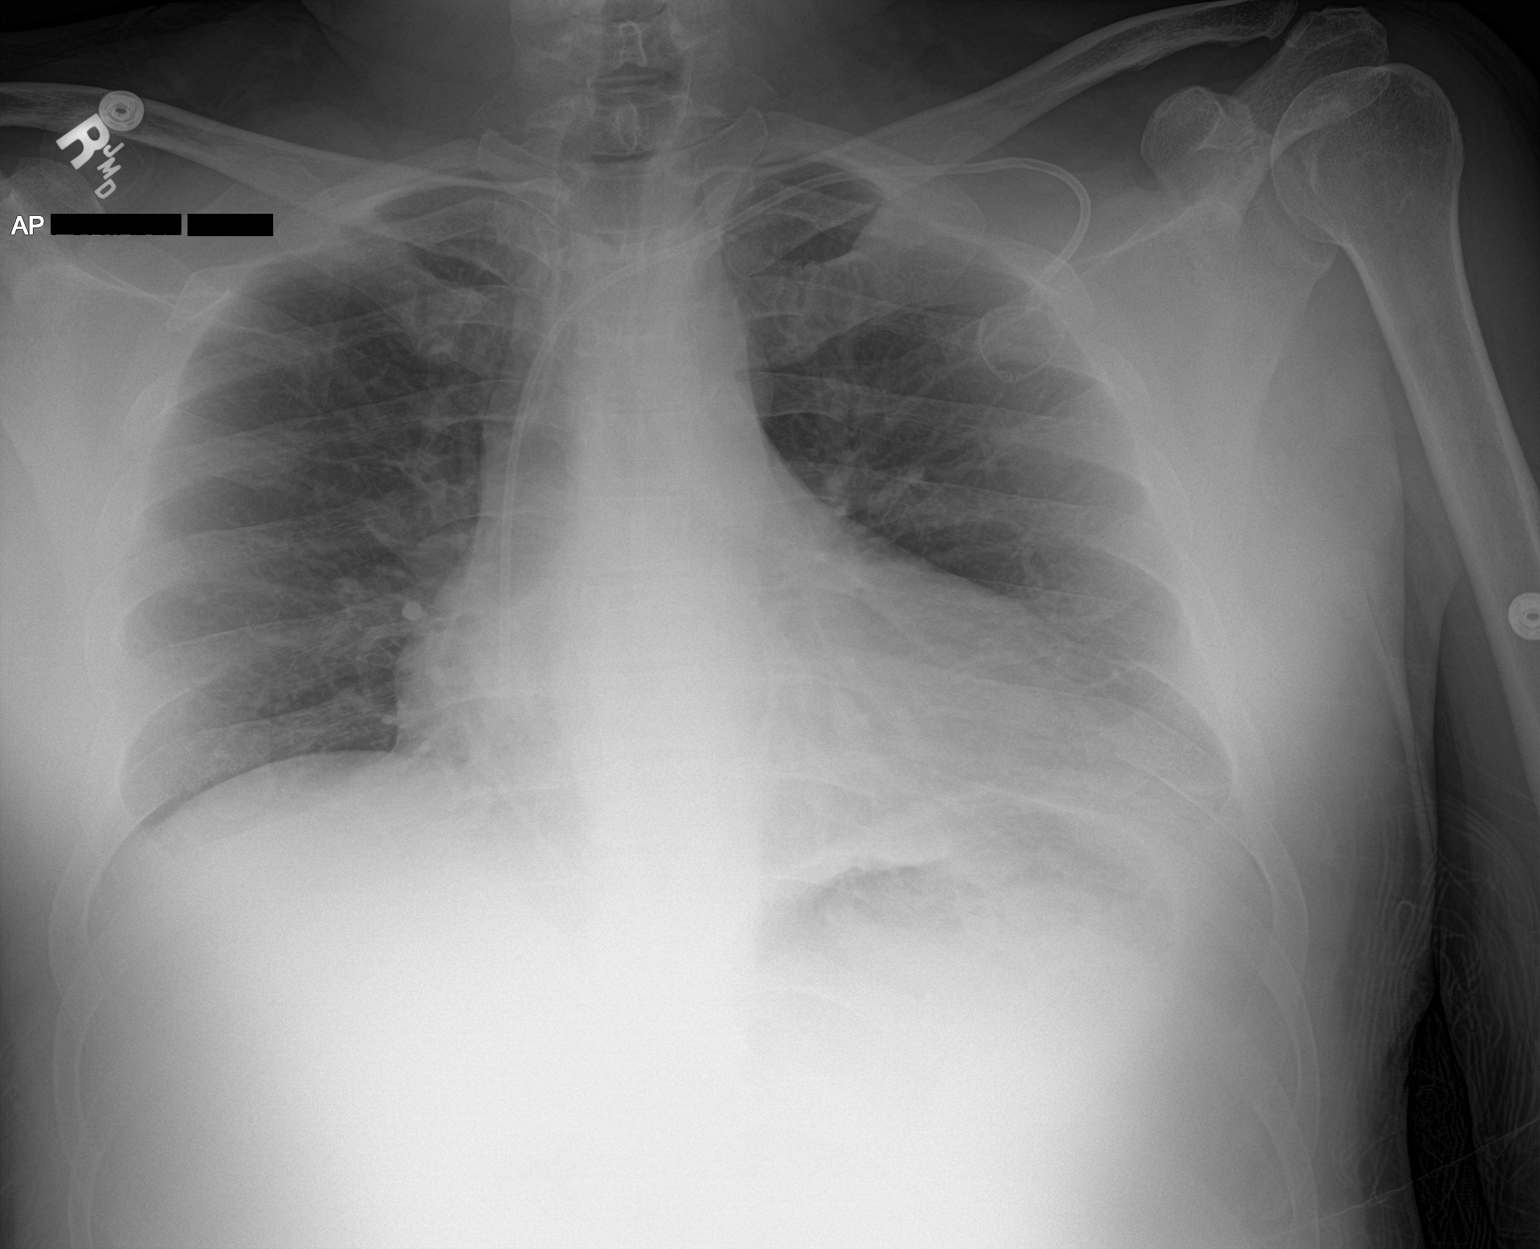

[1 of 1 positions shown; findings below may reference images not displayed]

FINDINGS: New left-sided subclavian single-lumen porta cath with tip
terminating in the right atrium. Lung volumes are low. No
consolidative airspace disease. No pleural effusions. No
pneumothorax. No pulmonary nodule or mass noted. Pulmonary
vasculature and the cardiomediastinal silhouette are within normal
limits.
IMPRESSION: 1. New left-sided subclavian Port-A-Cath with tip terminating in the
right atrium.
2. Low lung volumes without radiographic evidence of acute
cardiopulmonary disease.

## 2023-02-06 ENCOUNTER — Encounter: Payer: Self-pay | Admitting: Oncology

## 2023-02-06 ENCOUNTER — Ambulatory Visit: Payer: Commercial Managed Care - PPO | Admitting: Orthopedic Surgery

## 2023-02-06 DIAGNOSIS — L97521 Non-pressure chronic ulcer of other part of left foot limited to breakdown of skin: Secondary | ICD-10-CM

## 2023-02-06 DIAGNOSIS — Z89432 Acquired absence of left foot: Secondary | ICD-10-CM

## 2023-02-08 ENCOUNTER — Encounter: Payer: Self-pay | Admitting: Orthopedic Surgery

## 2023-02-08 NOTE — Progress Notes (Signed)
Office Visit Note   Patient: Jeffrey Young           Date of Birth: 31-May-1965           MRN: [id number] Visit Date: 02/06/2023              Requested by: Tracey Harries, MD [address] [place],  [place] [address] PCP: Tracey Harries, MD  Chief Complaint  Patient presents with   Left Foot - Follow-up    Hx left TMA       HPI: Patient is a 57 year old gentleman status post left transmetatarsal amputation.  Patient is full weightbearing in a custom sandal and custom orthotic.  Assessment & Plan: Visit Diagnoses:  1. History of transmetatarsal amputation of left foot (HCC)     Plan: Ulcer was debrided of skin and soft tissue no signs of infection, and no ischemic changes.  Continue with wound care.  Follow-Up Instructions: Return in about 4 weeks (around 03/06/2023).   Ortho Exam  Patient is alert, oriented, no adenopathy, well-dressed, normal affect, normal respiratory effort. Examination patient's foot is plantigrade he has a Wagner grade 1 ulcer on the plantar aspect of the transmetatarsal amputation.  After informed consent a 10 blade knife was used to debride the skin and soft tissue back to healthy viable tissue.  The wound was 2 x 4 cm and 2 mm deep after debridement.  There is no tunneling no signs of infection.  Silver nitrate was used hemostasis.  Imaging: No results found. No images are attached to the encounter.  Labs: Lab Results  Component Value Date   HGBA1C 11.4 (H) 04/02/2021   HGBA1C 6.7 (H) 07/29/2020   HGBA1C 7.2 (H) 07/25/2020   ESRSEDRATE 121 (H) 01/23/2018   CRP 21.4 (H) 01/23/2018   REPTSTATUS 04/03/2021 FINAL 04/01/2021   GRAMSTAIN  01/22/2018    RARE WBC PRESENT, PREDOMINANTLY PMN MODERATE GRAM POSITIVE COCCI MODERATE GRAM NEGATIVE RODS FEW GRAM POSITIVE RODS Performed at [facility], [address] [place], [place] [address]    CULT  04/01/2021    NO GROWTH Performed at [facility], [address] [place], [place] [address]    LABORGA PROTEUS MIRABILIS 01/22/2018     Lab Results  Component Value Date   ALBUMIN 3.1 (L) 07/07/2022   ALBUMIN 2.6 (L) 04/03/2021   ALBUMIN 3.4 (L) 04/01/2021   PREALBUMIN 6.2 (L) 01/23/2018    Lab Results  Component Value Date   MG 1.8 04/13/2021   MG 1.8 04/12/2021   MG 1.8 04/11/2021   No results found for: "VD25OH"  Lab Results  Component Value Date   PREALBUMIN 6.2 (L) 01/23/2018      Latest Ref Rng & Units 11/06/2022    8:14 AM 07/07/2022    8:08 AM 04/13/2021    3:45 AM  CBC EXTENDED  WBC 4.0 - 10.5 K/uL 7.6  7.9  6.2   RBC 4.22 - 5.81 MIL/uL 3.11  3.59  2.61   Hemoglobin 13.0 - 17.0 g/dL 9.6  01.0  8.3   HCT 27.2 - 52.0 % 28.1  31.7  25.4   Platelets 150 - 400 K/uL 215  241  190   NEUT# 1.7 - 7.7 K/uL 4.7  5.7    Lymph# 0.7 - 4.0 K/uL 1.8  1.4       There is no height or weight on file to calculate BMI.  Orders:  No orders of the defined types were  placed in this encounter.  No orders of the defined types were placed in this encounter.    Procedures: No procedures performed  Clinical Data: No additional findings.  ROS:  All other systems negative, except as noted in the HPI. Review of Systems  Objective: Vital Signs: There were no vitals taken for this visit.  Specialty Comments:  No specialty comments available.  PMFS History: Patient Active Problem List   Diagnosis Date Noted   Malignant neoplasm of colon (HCC)    GI bleed 04/02/2021   Severe sepsis (HCC) 04/02/2021   AKI (acute kidney injury) (HCC) 04/02/2021   Acute metabolic encephalopathy 04/02/2021   Hypokalemia 04/02/2021   Encephalopathy    Nausea and vomiting    Coffee ground emesis    Elevated bilirubin    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 07/31/2020   IDA (iron deficiency anemia) from bleeding colon cancer 07/31/2020   Diabetic retinopathy (HCC) 07/31/2020   Hyperlipidemia 07/31/2020   Low back pain 07/31/2020    Shortness of breath 07/31/2020   Systolic heart failure (HCC) 07/31/2020   CKD (chronic kidney disease) stage 3, GFR 30-59 ml/min (HCC) 07/31/2020   Insulin-requiring or dependent type II diabetes mellitus (HCC) 07/31/2020   ARF (acute renal failure) (HCC) 07/25/2020   Symptomatic anemia    New onset of congestive heart failure (HCC) 07/24/2020   GIB (gastrointestinal bleeding) 07/24/2020   Hypertension associated with diabetes (HCC) 06/07/2019   Erectile dysfunction associated with type 2 diabetes mellitus (HCC) 05/08/2019   Sepsis (HCC) 01/22/2018   Diabetic ulcer of left foot (HCC) 01/22/2018   Status post transmetatarsal amputation of left foot (HCC) 01/22/2018   Diabetic mononeuropathy associated with type 2 diabetes mellitus (HCC) 03/21/2017   Microalbuminuria due to type 2 diabetes mellitus (HCC) 03/21/2017   Vitamin D deficiency 04/06/2009   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 Metatarsalgia  10/1 IMO update   Erectile dysfunction associated with type 2 diabetes mellitus (HCC) 05/08/2019   Hyperlipidemia 07/31/2020   Hypertension associated with diabetes (HCC) 06/07/2019   Microalbuminuria due to type 2 diabetes mellitus (HCC) 03/21/2017   Necrotizing fasciitis of ankle and foot (HCC) 01/22/2018   Necrotizing soft tissue infection    Status post transmetatarsal amputation of left foot (HCC) 01/22/2018   Systolic heart failure (HCC) 07/31/2020   Uncontrolled type 2 diabetes mellitus with both eyes  affected by severe nonproliferative retinopathy and macular edema, with long-term current use of insulin 04/02/2009   Formatting of this note might be different from the original. Type 2 Diabetes Mellitus - Uncomplicated, Uncontrolled    Family History  Problem Relation Age of Onset   Hypertension Father     Past Surgical History:  Procedure Laterality Date   AMPUTATION Left 01/22/2018   Procedure: TRANSMETATARSAL AMPUTATION;  Surgeon: Nadara Mustard, MD;  Location: [facility] OR;  Service: Orthopedics;  Laterality: Left;toes   BIOPSY  07/27/2020   Procedure: BIOPSY;  Surgeon: Jeani Hawking, MD;  Location: [facility];  Service: Endoscopy;;  COLON RESECTION N/A 07/30/2020   Procedure: HAND ASSISTED LAPAROSCOPIC LEFT HEMI COLECTOMY;  Surgeon: Luretha Murphy, MD;  Location: [facility];  Service: General;  Laterality: N/A;   COLONOSCOPY WITH PROPOFOL N/A 05/24/2018   Procedure: COLONOSCOPY WITH PROPOFOL;  Surgeon: Jeani Hawking, MD;  Location: [facility];  Service: Endoscopy;  Laterality: N/A;   COLONOSCOPY WITH PROPOFOL N/A 07/27/2020   Procedure: COLONOSCOPY WITH PROPOFOL;  Surgeon: Jeani Hawking, MD;  Location: [facility];  Service: Endoscopy;  Laterality: N/A;   ESOPHAGOGASTRODUODENOSCOPY Left 04/05/2021   Procedure: ESOPHAGOGASTRODUODENOSCOPY (EGD);  Surgeon: Jeani Hawking, MD;  Location: [facility];  Service: Endoscopy;  Laterality: Left;   POLYPECTOMY  05/24/2018   Procedure: POLYPECTOMY;  Surgeon: Jeani Hawking, MD;  Location: [facility];  Service: Endoscopy;;   PORTACATH PLACEMENT Left 08/24/2020   Procedure: INSERTION PORT-A-CATH;  Surgeon: Luretha Murphy, MD;  Location: [facility];  Service: General;  Laterality: Left;  75/rm1   SUBMUCOSAL TATTOO INJECTION  07/27/2020   Procedure: SUBMUCOSAL TATTOO INJECTION;  Surgeon: Jeani Hawking, MD;  Location: [facility];  Service: Endoscopy;;   Social History   Occupational History   Not on file  Tobacco Use   Smoking status: Never   Smokeless tobacco:  Never  Vaping Use   Vaping status: Never Used  Substance and Sexual Activity   Alcohol use: Yes    Alcohol/week: 1.0 standard drink of alcohol    Types: 1 Cans of beer per week    Comment: occasional   Drug use: Never   Sexual activity: Yes

## 2023-02-12 ENCOUNTER — Encounter: Payer: Self-pay | Admitting: Oncology

## 2023-03-05 ENCOUNTER — Inpatient Hospital Stay: Payer: Commercial Managed Care - PPO | Attending: Oncology

## 2023-03-05 ENCOUNTER — Ambulatory Visit (HOSPITAL_BASED_OUTPATIENT_CLINIC_OR_DEPARTMENT_OTHER)
Admission: RE | Admit: 2023-03-05 | Discharge: 2023-03-05 | Disposition: A | Discharge status: Home / Self Care | Payer: Commercial Managed Care - PPO | Source: Ambulatory Visit | Attending: Oncology | Admitting: Oncology

## 2023-03-05 ENCOUNTER — Inpatient Hospital Stay: Payer: Commercial Managed Care - PPO

## 2023-03-05 DIAGNOSIS — Z85038 Personal history of other malignant neoplasm of large intestine: Secondary | ICD-10-CM | POA: Insufficient documentation

## 2023-03-05 DIAGNOSIS — Z452 Encounter for adjustment and management of vascular access device: Secondary | ICD-10-CM | POA: Diagnosis present

## 2023-03-05 DIAGNOSIS — C189 Malignant neoplasm of colon, unspecified: Secondary | ICD-10-CM | POA: Insufficient documentation

## 2023-03-05 LAB — CEA (ACCESS): CEA (CHCC): 63.07 ng/mL — ABNORMAL HIGH (ref 0.00–5.00)

## 2023-03-05 MED ORDER — HEPARIN SOD (PORK) LOCK FLUSH 100 UNIT/ML IV SOLN
500.0000 [IU] | Freq: Once | INTRAVENOUS | Status: AC
  Administered 2023-03-05: 500 [IU] via INTRAVENOUS

## 2023-03-06 ENCOUNTER — Telehealth: Payer: Self-pay

## 2023-03-06 ENCOUNTER — Other Ambulatory Visit: Payer: 59

## 2023-03-06 NOTE — Telephone Encounter (Signed)
-----   Message from Thornton Papas sent at 03/05/2023  5:07 PM EDT ----- Please call patient CTs are overall stable, no clear evidence of progressive cancer, follow-up as scheduled, we will review the CTs at the next office visit

## 2023-03-06 NOTE — Telephone Encounter (Signed)
Patient gave verbal understanding and had no further questions or concerns

## 2023-03-07 ENCOUNTER — Inpatient Hospital Stay: Payer: Commercial Managed Care - PPO | Admitting: Oncology

## 2023-03-08 ENCOUNTER — Ambulatory Visit (INDEPENDENT_AMBULATORY_CARE_PROVIDER_SITE_OTHER): Payer: Commercial Managed Care - PPO | Admitting: Orthopedic Surgery

## 2023-03-08 DIAGNOSIS — Z89432 Acquired absence of left foot: Secondary | ICD-10-CM | POA: Diagnosis not present

## 2023-03-08 DIAGNOSIS — L97521 Non-pressure chronic ulcer of other part of left foot limited to breakdown of skin: Secondary | ICD-10-CM | POA: Diagnosis not present

## 2023-03-13 ENCOUNTER — Encounter: Payer: Self-pay | Admitting: Orthopedic Surgery

## 2023-03-13 NOTE — Progress Notes (Signed)
Office Visit Note   Patient: Jeffrey Young           Date of Birth: 1966-03-03           MRN: [id number] Visit Date: 03/08/2023              Requested by: Tracey Harries, MD [address] [place],  [place] [address] PCP: Tracey Harries, MD  Chief Complaint  Patient presents with   Left Foot - Follow-up    HX TMA       HPI: Patient is a 57 year old gentleman status post left transmetatarsal amputation with a Wagner grade 1 ulcer of the left forefoot.  Assessment & Plan: Visit Diagnoses:  1. History of transmetatarsal amputation of left foot (HCC)     Plan: Ulcer was debrided patient will continue with his kneeling scooter and custom orthotics.  Follow-Up Instructions: Return in about 4 weeks (around 04/05/2023).   Ortho Exam  Patient is alert, oriented, no adenopathy, well-dressed, normal affect, normal respiratory effort. Examination patient has a persistent Wagner grade 1 ulcer.  He has excellent modified custom orthotics he is currently using a kneeling scooter.  After informed consent a 10 blade knife was used to debride the skin and soft tissue back to healthy viable granulation tissue.  There is no undermining no tunneling to bone or tendon no cellulitis no drainage.  The Wagner grade 1 ulcer is 3 x 4 cm and 2 mm deep after debridement.  No recent hemoglobin A1c in the chart.  Imaging: No results found. No images are attached to the encounter.  Labs: Lab Results  Component Value Date   HGBA1C 11.4 (H) 04/02/2021   HGBA1C 6.7 (H) 07/29/2020   HGBA1C 7.2 (H) 07/25/2020   ESRSEDRATE 121 (H) 01/23/2018   CRP 21.4 (H) 01/23/2018   REPTSTATUS 04/03/2021 FINAL 04/01/2021   GRAMSTAIN  01/22/2018    RARE WBC PRESENT, PREDOMINANTLY PMN MODERATE GRAM POSITIVE COCCI MODERATE GRAM NEGATIVE RODS FEW GRAM POSITIVE RODS Performed at [facility], [address], [place], [place] [address]    CULT  04/01/2021    NO GROWTH Performed at [facility], [address], [place], [place] [address]    LABORGA PROTEUS MIRABILIS 01/22/2018     Lab Results  Component Value Date   ALBUMIN 3.1 (L) 07/07/2022   ALBUMIN 2.6 (L) 04/03/2021   ALBUMIN 3.4 (L) 04/01/2021   PREALBUMIN 6.2 (L) 01/23/2018    Lab Results  Component Value Date   MG 1.8 04/13/2021   MG 1.8 04/12/2021   MG 1.8 04/11/2021   No results found for: "VD25OH"  Lab Results  Component Value Date   PREALBUMIN 6.2 (L) 01/23/2018      Latest Ref Rng & Units 11/06/2022    8:14 AM 07/07/2022    8:08 AM 04/13/2021    3:45 AM  CBC EXTENDED  WBC 4.0 - 10.5 K/uL 7.6  7.9  6.2   RBC 4.22 - 5.81 MIL/uL 3.11  3.59  2.61   Hemoglobin 13.0 - 17.0 g/dL 9.6  66.4  8.3   HCT 40.3 - 52.0 % 28.1  31.7  25.4   Platelets 150 - 400 K/uL 215  241  190   NEUT# 1.7 - 7.7 K/uL 4.7  5.7    Lymph# 0.7 - 4.0 K/uL 1.8  1.4       There is no height or weight on file to calculate BMI.  Orders:  No orders of  the defined types were placed in this encounter.  No orders of the defined types were placed in this encounter.    Procedures: No procedures performed  Clinical Data: No additional findings.  ROS:  All other systems negative, except as noted in the HPI. Review of Systems  Objective: Vital Signs: There were no vitals taken for this visit.  Specialty Comments:  No specialty comments available.  PMFS History: Patient Active Problem List   Diagnosis Date Noted   Malignant neoplasm of colon (HCC)    GI bleed 04/02/2021   Severe sepsis (HCC) 04/02/2021   AKI (acute kidney injury) (HCC) 04/02/2021   Acute metabolic encephalopathy 04/02/2021   Hypokalemia 04/02/2021   Encephalopathy    Nausea and vomiting    Coffee ground emesis    Elevated bilirubin    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 07/31/2020   IDA (iron deficiency anemia) from bleeding colon cancer 07/31/2020   Diabetic retinopathy (HCC) 07/31/2020   Hyperlipidemia 07/31/2020   Low  back pain 07/31/2020   Shortness of breath 07/31/2020   Systolic heart failure (HCC) 07/31/2020   CKD (chronic kidney disease) stage 3, GFR 30-59 ml/min (HCC) 07/31/2020   Insulin-requiring or dependent type II diabetes mellitus (HCC) 07/31/2020   ARF (acute renal failure) (HCC) 07/25/2020   Symptomatic anemia    New onset of congestive heart failure (HCC) 07/24/2020   GIB (gastrointestinal bleeding) 07/24/2020   Hypertension associated with diabetes (HCC) 06/07/2019   Erectile dysfunction associated with type 2 diabetes mellitus (HCC) 05/08/2019   Sepsis (HCC) 01/22/2018   Diabetic ulcer of left foot (HCC) 01/22/2018   Status post transmetatarsal amputation of left foot (HCC) 01/22/2018   Diabetic mononeuropathy associated with type 2 diabetes mellitus (HCC) 03/21/2017   Microalbuminuria due to type 2 diabetes mellitus (HCC) 03/21/2017   Vitamin D deficiency 04/06/2009   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 Metatarsalgia  10/1 IMO update   Erectile dysfunction associated with type 2 diabetes mellitus (HCC) 05/08/2019   Hyperlipidemia 07/31/2020   Hypertension associated with diabetes (HCC) 06/07/2019   Microalbuminuria due to type 2 diabetes mellitus (HCC) 03/21/2017   Necrotizing fasciitis of ankle and foot (HCC) 01/22/2018   Necrotizing soft tissue infection    Status post transmetatarsal amputation of left foot (HCC) 01/22/2018   Systolic heart failure (HCC) 07/31/2020   Uncontrolled type 2 diabetes  mellitus with both eyes affected by severe nonproliferative retinopathy and macular edema, with long-term current use of insulin 04/02/2009   Formatting of this note might be different from the original. Type 2 Diabetes Mellitus - Uncomplicated, Uncontrolled    Family History  Problem Relation Age of Onset   Hypertension Father     Past Surgical History:  Procedure Laterality Date   AMPUTATION Left 01/22/2018   Procedure: TRANSMETATARSAL AMPUTATION;  Surgeon: Nadara Mustard, MD;  Location: [facility] OR;  Service: Orthopedics;  Laterality: Left;toes   BIOPSY  07/27/2020   Procedure: BIOPSY;  Surgeon: Jeani Hawking, MD;  Location: [facility] ENDOSCOPY;  Service: Endoscopy;;   COLON RESECTION N/A 07/30/2020   Procedure: HAND ASSISTED LAPAROSCOPIC LEFT HEMI COLECTOMY;  Surgeon: Luretha Murphy, MD;  Location: [facility] ORS;  Service: General;  Laterality: N/A;   COLONOSCOPY WITH PROPOFOL N/A 05/24/2018   Procedure: COLONOSCOPY WITH PROPOFOL;  Surgeon: Jeani Hawking, MD;  Location: [facility]COPY;  Service: Endoscopy;  Laterality: N/A;   COLONOSCOPY WITH PROPOFOL N/A 07/27/2020   Procedure: COLONOSCOPY WITH PROPOFOL;  Surgeon: Jeani Hawking, MD;  Location: [facility];  Service: Endoscopy;  Laterality: N/A;   ESOPHAGOGASTRODUODENOSCOPY Left 04/05/2021   Procedure: ESOPHAGOGASTRODUODENOSCOPY (EGD);  Surgeon: Jeani Hawking, MD;  Location: [facility];  Service: Endoscopy;  Laterality: Left;   POLYPECTOMY  05/24/2018   Procedure: POLYPECTOMY;  Surgeon: Jeani Hawking, MD;  Location: [facility];  Service: Endoscopy;;   PORTACATH PLACEMENT Left 08/24/2020   Procedure: INSERTION PORT-A-CATH;  Surgeon: Luretha Murphy, MD;  Location: [facility] ORS;  Service: General;  Laterality: Left;  75/rm1   SUBMUCOSAL TATTOO INJECTION  07/27/2020   Procedure: SUBMUCOSAL TATTOO INJECTION;  Surgeon: Jeani Hawking, MD;  Location: [facility];  Service: Endoscopy;;   Social History   Occupational History   Not on file  Tobacco Use   Smoking status:  Never   Smokeless tobacco: Never  Vaping Use   Vaping status: Never Used  Substance and Sexual Activity   Alcohol use: Yes    Alcohol/week: 1.0 standard drink of alcohol    Types: 1 Cans of beer per week    Comment: occasional   Drug use: Never   Sexual activity: Yes

## 2023-03-26 ENCOUNTER — Inpatient Hospital Stay: Payer: Commercial Managed Care - PPO | Attending: Oncology | Admitting: Oncology

## 2023-03-26 VITALS — BP 159/82 | HR 66 | Temp 98.1°F | Resp 18 | Ht 73.0 in | Wt 209.0 lb

## 2023-03-26 DIAGNOSIS — E11621 Type 2 diabetes mellitus with foot ulcer: Secondary | ICD-10-CM | POA: Diagnosis not present

## 2023-03-26 DIAGNOSIS — C189 Malignant neoplasm of colon, unspecified: Secondary | ICD-10-CM

## 2023-03-26 DIAGNOSIS — Z9221 Personal history of antineoplastic chemotherapy: Secondary | ICD-10-CM | POA: Diagnosis not present

## 2023-03-26 DIAGNOSIS — Z85038 Personal history of other malignant neoplasm of large intestine: Secondary | ICD-10-CM | POA: Insufficient documentation

## 2023-03-26 NOTE — Progress Notes (Signed)
[facility] OFFICE PROGRESS NOTE   Diagnosis: Colon cancer  INTERVAL HISTORY:   Jeffrey Young returns as scheduled.  He feels well.  He is followed by orthopedics for a left foot ulcer.  He reports his blood sugars under better control.  No new complaint.  Objective:  Vital signs in last 24 hours:  Blood pressure (!) 159/82, pulse 66, temperature 98.1 F (36.7 C), temperature source Temporal, resp. rate 18, height 6\' 1"  (1.854 m), weight 209 lb (94.8 kg), SpO2 100%.   Lymphatics: No cervical, supraclavicular, axillary, or inguinal nodes Resp: Lungs clear bilaterally Cardio: Regular rate and rhythm GI: No hepatosplenomegaly    Portacath/PICC-without erythema  Lab Results:  Lab Results  Component Value Date   WBC 7.6 11/06/2022   HGB 9.6 (L) 11/06/2022   HCT 28.1 (L) 11/06/2022   MCV 90.4 11/06/2022   PLT 215 11/06/2022   NEUTROABS 4.7 11/06/2022    CMP  Lab Results  Component Value Date   NA 135 11/06/2022   K 3.4 (L) 11/06/2022   CL 104 11/06/2022   CO2 24 11/06/2022   GLUCOSE 204 (H) 11/06/2022   BUN 33 (H) 11/06/2022   CREATININE 3.53 (H) 11/06/2022   CALCIUM 8.0 (L) 11/06/2022   PROT 6.9 07/07/2022   ALBUMIN 3.1 (L) 07/07/2022   AST 10 (L) 07/07/2022   ALT 6 07/07/2022   ALKPHOS 152 (H) 07/07/2022   BILITOT 0.3 07/07/2022   GFRNONAA 19 (L) 11/06/2022   GFRAA >60 01/26/2018    Lab Results  Component Value Date   CEA1 11.44 (H) 09/27/2020   CEA 63.07 (H) 03/05/2023    Lab Results  Component Value Date   INR 0.9 04/01/2021   LABPROT 12.4 04/01/2021    Imaging:  No results found.  Medications: I have reviewed the patient's current medications.   Assessment/Plan: Descending colon cancer, stage IIIc (T3N2b M0), status post a partial left colectomy 07/30/2020, 9/16 lymph nodes positive, lymphovascular invasion, 1 satellite nodule, negative margins, MSS, no loss of mismatch repair protein expression; foundation 1 K-ras wild-type,  NRAS Q61H, microsatellite stable, tumor mutation burden 4. -History of large polyp in the left side of the colon-referred to [facility] in 05/2018 for procedure canceled secondary to COVID-19 pandemic.  Procedure was not rescheduled. -CT chest/abdomen/pelvis with contrast 07/27/2020-3 small pulmonary nodules less than 5 mm favored to be benign, circumferential luminal narrowing of the distal transverse colon concerning for malignancy, no metastatic adenopathy in the mesentery porta hepatis, no for metastasis. -CEA on 07/27/2020 was 17.3; 37 on 08/30/2020; 33 on 09/13/2020 -Colonoscopy performed 07/27/2020 showed a fungating, infiltrative and ulcerated nonobstructing large mass in the proximal descending colon.  Biopsy-adenocarcinoma -Cycle 1 FOLFOX 08/30/2020 -Cycle 2 FOLFOX 09/13/2020, Emend added for delayed nausea -Cycle 3 FOLFOX 09/27/2020 -Cycle 4 FOLFOX 10/11/2020 -Cycle 5 FOLFOX 11/08/2020 -Cycle 6 FOLFOX 11/23/2020 -CT 12/03/2020-prior 3 mm left apical nodule no longer seen, no new/suspicious pulmonary nodules, no evidence of metastatic disease -Cycle 7 FOLFOX 12/06/2020 -Cycle 8 FOLFOX 12/21/2020 -Cycle 9 FOLFOX 01/03/2021 -Cycle 10 FOLFOX 01/17/2021 -Cycle 11 FOLFOX 01/31/2021, oxaliplatin held secondary to neuropathy symptoms -Cycle 12 FOLFOX 02/15/2021, oxaliplatin held secondary to neuropathy -CT abdomen/pelvis 04/02/2021-no evidence of recurrent colon cancer -CT chest 04/08/2021-no evidence of metastatic disease -Elevated CEA February and March 2023 -08/17/2021 PET scan-no evidence of local recurrence or metastasis -09/26/2021-Guardant-ctDNA detected -Colonoscopy 10/04/2021-negative -CT 11/17/2021-no evidence of metastatic disease -CTs 01/08/2022-no evidence of metastatic disease -PET scan 08/10/2022-several newly enlarged lymph nodes with moderate metabolic activity -CT  11/06/2022-stable left supraclavicular and retroperitoneal nodes, mild progression of pelvic adenopathy, new 6 mm segment 2 liver lesion -CT  03/05/2023-crease size of left supraclavicular node and retroperitoneal nodes, increased size of gastropathic and left iliac side chain lymph nodes, stable subtle hypoattenuating lesions at segment 2 in the hepatic dome 2.  Anemia due to GI bleeding, iron deficiency?,  Renal insufficiency? 3.  New onset acute diastolic CHF March 2022 4.  Diabetes mellitus 5.  Renal insufficiency 6.  Hypertension 7.  History of left transmetatarsal amputation 8.  History of colon polyps 9.  Neuropathy 10.  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  He is asymptomatic.  The restaging CTs 03/04/2013 is consistent with overall stable disease.  The CEA is lower.  I discussed treatment options with Jeffrey Young and his wife.  We reviewed the CT images.  He agrees to continue observation.  He will return for a Port-A-Cath flush in approximately 3 weeks.  He will be scheduled for an office visit and CEA in 12 weeks.  We will plan for a restaging CT at a 4-68-month interval.  Thornton Papas, MD  03/26/2023  9:23 AM

## 2023-04-05 ENCOUNTER — Ambulatory Visit: Payer: Commercial Managed Care - PPO | Admitting: Orthopedic Surgery

## 2023-04-06 ENCOUNTER — Ambulatory Visit: Payer: Commercial Managed Care - PPO | Admitting: Orthopedic Surgery

## 2023-04-23 ENCOUNTER — Inpatient Hospital Stay: Payer: Commercial Managed Care - PPO

## 2023-04-26 ENCOUNTER — Inpatient Hospital Stay: Payer: Commercial Managed Care - PPO | Attending: Oncology

## 2023-04-26 DIAGNOSIS — Z452 Encounter for adjustment and management of vascular access device: Secondary | ICD-10-CM | POA: Insufficient documentation

## 2023-04-26 DIAGNOSIS — Z85038 Personal history of other malignant neoplasm of large intestine: Secondary | ICD-10-CM | POA: Insufficient documentation

## 2023-04-30 ENCOUNTER — Inpatient Hospital Stay: Payer: Commercial Managed Care - PPO

## 2023-04-30 VITALS — BP 138/87 | HR 76 | Temp 98.0°F | Resp 18

## 2023-04-30 DIAGNOSIS — Z95828 Presence of other vascular implants and grafts: Secondary | ICD-10-CM

## 2023-04-30 DIAGNOSIS — Z85038 Personal history of other malignant neoplasm of large intestine: Secondary | ICD-10-CM | POA: Diagnosis present

## 2023-04-30 DIAGNOSIS — Z452 Encounter for adjustment and management of vascular access device: Secondary | ICD-10-CM | POA: Diagnosis not present

## 2023-04-30 MED ORDER — HEPARIN SOD (PORK) LOCK FLUSH 100 UNIT/ML IV SOLN
500.0000 [IU] | Freq: Once | INTRAVENOUS | Status: AC
  Administered 2023-04-30: 500 [IU] via INTRAVENOUS

## 2023-04-30 MED ORDER — SODIUM CHLORIDE 0.9% FLUSH
10.0000 mL | Freq: Once | INTRAVENOUS | Status: AC
  Administered 2023-04-30: 10 mL via INTRAVENOUS

## 2023-04-30 NOTE — Patient Instructions (Signed)

## 2023-05-07 ENCOUNTER — Encounter (HOSPITAL_BASED_OUTPATIENT_CLINIC_OR_DEPARTMENT_OTHER): Payer: Self-pay

## 2023-05-07 ENCOUNTER — Inpatient Hospital Stay (HOSPITAL_BASED_OUTPATIENT_CLINIC_OR_DEPARTMENT_OTHER)
Admission: EM | Admit: 2023-05-07 | Discharge: 2023-05-22 | DRG: 408 | Disposition: A | Discharge status: Home / Self Care | Payer: Commercial Managed Care - PPO | Attending: Internal Medicine | Admitting: Internal Medicine

## 2023-05-07 ENCOUNTER — Other Ambulatory Visit: Payer: Self-pay

## 2023-05-07 ENCOUNTER — Emergency Department (HOSPITAL_BASED_OUTPATIENT_CLINIC_OR_DEPARTMENT_OTHER): Payer: Commercial Managed Care - PPO

## 2023-05-07 DIAGNOSIS — D638 Anemia in other chronic diseases classified elsewhere: Secondary | ICD-10-CM | POA: Diagnosis not present

## 2023-05-07 DIAGNOSIS — K831 Obstruction of bile duct: Secondary | ICD-10-CM | POA: Diagnosis present

## 2023-05-07 DIAGNOSIS — E119 Type 2 diabetes mellitus without complications: Secondary | ICD-10-CM | POA: Diagnosis not present

## 2023-05-07 DIAGNOSIS — E1169 Type 2 diabetes mellitus with other specified complication: Secondary | ICD-10-CM | POA: Diagnosis present

## 2023-05-07 DIAGNOSIS — C787 Secondary malignant neoplasm of liver and intrahepatic bile duct: Principal | ICD-10-CM | POA: Diagnosis present

## 2023-05-07 DIAGNOSIS — E871 Hypo-osmolality and hyponatremia: Secondary | ICD-10-CM | POA: Diagnosis present

## 2023-05-07 DIAGNOSIS — I5022 Chronic systolic (congestive) heart failure: Secondary | ICD-10-CM | POA: Diagnosis present

## 2023-05-07 DIAGNOSIS — C189 Malignant neoplasm of colon, unspecified: Secondary | ICD-10-CM | POA: Diagnosis not present

## 2023-05-07 DIAGNOSIS — K631 Perforation of intestine (nontraumatic): Secondary | ICD-10-CM | POA: Diagnosis not present

## 2023-05-07 DIAGNOSIS — S91302S Unspecified open wound, left foot, sequela: Secondary | ICD-10-CM | POA: Diagnosis not present

## 2023-05-07 DIAGNOSIS — E785 Hyperlipidemia, unspecified: Secondary | ICD-10-CM | POA: Diagnosis present

## 2023-05-07 DIAGNOSIS — R310 Gross hematuria: Secondary | ICD-10-CM | POA: Diagnosis present

## 2023-05-07 DIAGNOSIS — S91302A Unspecified open wound, left foot, initial encounter: Secondary | ICD-10-CM

## 2023-05-07 DIAGNOSIS — Z91040 Latex allergy status: Secondary | ICD-10-CM

## 2023-05-07 DIAGNOSIS — M1990 Unspecified osteoarthritis, unspecified site: Secondary | ICD-10-CM | POA: Diagnosis present

## 2023-05-07 DIAGNOSIS — I152 Hypertension secondary to endocrine disorders: Secondary | ICD-10-CM | POA: Diagnosis present

## 2023-05-07 DIAGNOSIS — E1122 Type 2 diabetes mellitus with diabetic chronic kidney disease: Secondary | ICD-10-CM | POA: Diagnosis present

## 2023-05-07 DIAGNOSIS — C185 Malignant neoplasm of splenic flexure: Secondary | ICD-10-CM | POA: Diagnosis present

## 2023-05-07 DIAGNOSIS — N179 Acute kidney failure, unspecified: Secondary | ICD-10-CM | POA: Diagnosis present

## 2023-05-07 DIAGNOSIS — K219 Gastro-esophageal reflux disease without esophagitis: Secondary | ICD-10-CM | POA: Diagnosis present

## 2023-05-07 DIAGNOSIS — E11319 Type 2 diabetes mellitus with unspecified diabetic retinopathy without macular edema: Secondary | ICD-10-CM | POA: Diagnosis present

## 2023-05-07 DIAGNOSIS — D631 Anemia in chronic kidney disease: Secondary | ICD-10-CM | POA: Diagnosis present

## 2023-05-07 DIAGNOSIS — Z91030 Bee allergy status: Secondary | ICD-10-CM

## 2023-05-07 DIAGNOSIS — R7989 Other specified abnormal findings of blood chemistry: Secondary | ICD-10-CM | POA: Diagnosis not present

## 2023-05-07 DIAGNOSIS — C779 Secondary and unspecified malignant neoplasm of lymph node, unspecified: Secondary | ICD-10-CM | POA: Diagnosis present

## 2023-05-07 DIAGNOSIS — Z794 Long term (current) use of insulin: Secondary | ICD-10-CM | POA: Diagnosis not present

## 2023-05-07 DIAGNOSIS — E872 Acidosis, unspecified: Secondary | ICD-10-CM | POA: Diagnosis present

## 2023-05-07 DIAGNOSIS — I130 Hypertensive heart and chronic kidney disease with heart failure and stage 1 through stage 4 chronic kidney disease, or unspecified chronic kidney disease: Secondary | ICD-10-CM | POA: Diagnosis present

## 2023-05-07 DIAGNOSIS — C801 Malignant (primary) neoplasm, unspecified: Secondary | ICD-10-CM | POA: Diagnosis present

## 2023-05-07 DIAGNOSIS — N184 Chronic kidney disease, stage 4 (severe): Secondary | ICD-10-CM | POA: Diagnosis present

## 2023-05-07 DIAGNOSIS — E876 Hypokalemia: Secondary | ICD-10-CM | POA: Diagnosis present

## 2023-05-07 DIAGNOSIS — Z85038 Personal history of other malignant neoplasm of large intestine: Secondary | ICD-10-CM

## 2023-05-07 DIAGNOSIS — E11649 Type 2 diabetes mellitus with hypoglycemia without coma: Secondary | ICD-10-CM | POA: Diagnosis not present

## 2023-05-07 DIAGNOSIS — I10 Essential (primary) hypertension: Secondary | ICD-10-CM

## 2023-05-07 DIAGNOSIS — L97429 Non-pressure chronic ulcer of left heel and midfoot with unspecified severity: Secondary | ICD-10-CM | POA: Diagnosis present

## 2023-05-07 DIAGNOSIS — Z89432 Acquired absence of left foot: Secondary | ICD-10-CM | POA: Diagnosis not present

## 2023-05-07 DIAGNOSIS — I160 Hypertensive urgency: Secondary | ICD-10-CM | POA: Diagnosis present

## 2023-05-07 DIAGNOSIS — I444 Left anterior fascicular block: Secondary | ICD-10-CM | POA: Diagnosis present

## 2023-05-07 DIAGNOSIS — E11621 Type 2 diabetes mellitus with foot ulcer: Secondary | ICD-10-CM | POA: Diagnosis present

## 2023-05-07 DIAGNOSIS — R319 Hematuria, unspecified: Principal | ICD-10-CM

## 2023-05-07 DIAGNOSIS — Z8249 Family history of ischemic heart disease and other diseases of the circulatory system: Secondary | ICD-10-CM

## 2023-05-07 DIAGNOSIS — N189 Chronic kidney disease, unspecified: Secondary | ICD-10-CM | POA: Diagnosis not present

## 2023-05-07 DIAGNOSIS — D630 Anemia in neoplastic disease: Secondary | ICD-10-CM | POA: Diagnosis present

## 2023-05-07 DIAGNOSIS — D509 Iron deficiency anemia, unspecified: Secondary | ICD-10-CM | POA: Diagnosis not present

## 2023-05-07 DIAGNOSIS — C785 Secondary malignant neoplasm of large intestine and rectum: Secondary | ICD-10-CM | POA: Diagnosis not present

## 2023-05-07 DIAGNOSIS — R17 Unspecified jaundice: Secondary | ICD-10-CM

## 2023-05-07 DIAGNOSIS — E1141 Type 2 diabetes mellitus with diabetic mononeuropathy: Secondary | ICD-10-CM | POA: Diagnosis present

## 2023-05-07 DIAGNOSIS — D500 Iron deficiency anemia secondary to blood loss (chronic): Secondary | ICD-10-CM | POA: Diagnosis present

## 2023-05-07 DIAGNOSIS — Z79899 Other long term (current) drug therapy: Secondary | ICD-10-CM

## 2023-05-07 DIAGNOSIS — Z9049 Acquired absence of other specified parts of digestive tract: Secondary | ICD-10-CM

## 2023-05-07 DIAGNOSIS — Z9221 Personal history of antineoplastic chemotherapy: Secondary | ICD-10-CM

## 2023-05-07 LAB — URINALYSIS, ROUTINE W REFLEX MICROSCOPIC
Bacteria, UA: NONE SEEN
Glucose, UA: 500 mg/dL — AB
Ketones, ur: NEGATIVE mg/dL
Leukocytes,Ua: NEGATIVE
Nitrite: NEGATIVE
Protein, ur: >300 mg/dL — AB
Specific Gravity, Urine: 1.017 (ref 1.005–1.030)
pH: 6.5 (ref 5.0–8.0)

## 2023-05-07 LAB — CBC WITH DIFFERENTIAL/PLATELET
Abs Immature Granulocytes: 0.12 10*3/uL — ABNORMAL HIGH (ref 0.00–0.07)
Basophils Absolute: 0.1 10*3/uL (ref 0.0–0.1)
Basophils Relative: 1 %
Eosinophils Absolute: 0.3 10*3/uL (ref 0.0–0.5)
Eosinophils Relative: 3 %
HCT: 22.4 % — ABNORMAL LOW (ref 39.0–52.0)
Hemoglobin: 7.7 g/dL — ABNORMAL LOW (ref 13.0–17.0)
Immature Granulocytes: 1 %
Lymphocytes Relative: 11 %
Lymphs Abs: 1 10*3/uL (ref 0.7–4.0)
MCH: 30.2 pg (ref 26.0–34.0)
MCHC: 34.4 g/dL (ref 30.0–36.0)
MCV: 87.8 fL (ref 80.0–100.0)
Monocytes Absolute: 0.8 10*3/uL (ref 0.1–1.0)
Monocytes Relative: 9 %
Neutro Abs: 6.6 10*3/uL (ref 1.7–7.7)
Neutrophils Relative %: 75 %
Platelets: 398 10*3/uL (ref 150–400)
RBC: 2.55 MIL/uL — ABNORMAL LOW (ref 4.22–5.81)
RDW: 16.3 % — ABNORMAL HIGH (ref 11.5–15.5)
WBC: 8.9 10*3/uL (ref 4.0–10.5)
nRBC: 0 % (ref 0.0–0.2)

## 2023-05-07 LAB — COMPREHENSIVE METABOLIC PANEL
ALT: 91 U/L — ABNORMAL HIGH (ref 0–44)
AST: 99 U/L — ABNORMAL HIGH (ref 15–41)
Albumin: 2.7 g/dL — ABNORMAL LOW (ref 3.5–5.0)
Alkaline Phosphatase: 1070 U/L — ABNORMAL HIGH (ref 38–126)
Anion gap: 12 (ref 5–15)
BUN: 43 mg/dL — ABNORMAL HIGH (ref 6–20)
CO2: 24 mmol/L (ref 22–32)
Calcium: 8.7 mg/dL — ABNORMAL LOW (ref 8.9–10.3)
Chloride: 94 mmol/L — ABNORMAL LOW (ref 98–111)
Creatinine, Ser: 5.02 mg/dL — ABNORMAL HIGH (ref 0.61–1.24)
GFR, Estimated: 13 mL/min — ABNORMAL LOW (ref >60)
Glucose, Bld: 177 mg/dL — ABNORMAL HIGH (ref 70–99)
Potassium: 3 mmol/L — ABNORMAL LOW (ref 3.5–5.1)
Sodium: 130 mmol/L — ABNORMAL LOW (ref 135–145)
Total Bilirubin: 13.2 mg/dL — ABNORMAL HIGH (ref <1.2)
Total Protein: 6.6 g/dL (ref 6.5–8.1)

## 2023-05-07 LAB — CBC
HCT: 22.1 % — ABNORMAL LOW (ref 39.0–52.0)
Hemoglobin: 7.4 g/dL — ABNORMAL LOW (ref 13.0–17.0)
MCH: 30.5 pg (ref 26.0–34.0)
MCHC: 33.5 g/dL (ref 30.0–36.0)
MCV: 90.9 fL (ref 80.0–100.0)
Platelets: 346 10*3/uL (ref 150–400)
RBC: 2.43 MIL/uL — ABNORMAL LOW (ref 4.22–5.81)
RDW: 16.7 % — ABNORMAL HIGH (ref 11.5–15.5)
WBC: 8.1 10*3/uL (ref 4.0–10.5)
nRBC: 0 % (ref 0.0–0.2)

## 2023-05-07 MED ORDER — PANTOPRAZOLE SODIUM 40 MG PO TBEC: 40.0000 mg | DELAYED_RELEASE_TABLET | Freq: Two times a day (BID) | ORAL | Status: DC

## 2023-05-07 MED ORDER — SODIUM CHLORIDE 0.9 % IV BOLUS
1000.0000 mL | Freq: Once | INTRAVENOUS | Status: AC
  Administered 2023-05-07: 1000 mL via INTRAVENOUS

## 2023-05-07 MED ORDER — SODIUM CHLORIDE 0.9% FLUSH: 10.0000 mL | INTRAVENOUS | Status: DC | PRN

## 2023-05-07 MED ORDER — CHLORHEXIDINE GLUCONATE CLOTH 2 % EX PADS
6.0000 | MEDICATED_PAD | Freq: Every day | CUTANEOUS | Status: DC
  Administered 2023-05-08 – 2023-05-22 (×12): 6 via TOPICAL

## 2023-05-07 MED ORDER — SODIUM CHLORIDE 0.9 % IV SOLN: INTRAVENOUS | Status: AC

## 2023-05-07 MED ORDER — PANTOPRAZOLE SODIUM 40 MG PO TBEC
40.0000 mg | DELAYED_RELEASE_TABLET | Freq: Once | ORAL | Status: AC
  Administered 2023-05-07: 40 mg via ORAL
  Filled 2023-05-07: qty 1

## 2023-05-07 MED ORDER — SODIUM CHLORIDE 0.9% FLUSH
10.0000 mL | Freq: Two times a day (BID) | INTRAVENOUS | Status: DC
  Administered 2023-05-08 – 2023-05-22 (×19): 10 mL

## 2023-05-07 NOTE — ED Notes (Signed)
Jim with cl called for transport

## 2023-05-07 NOTE — Progress Notes (Signed)
Plan of Care Note for accepted transfer   Patient: Jeffrey Young MRN: [id number]   DOA: 05/07/2023  Facility requesting transfer: [facility]. Requesting Provider: Derwood Kaplan, MD. Reason for transfer: Hyperbilirubinemia, AKI, hematuria, hypertensive urgency. Facility course:  57 year old male with past medical history of CKD, type 2 diabetes, diabetic retinopathy, left foot diabetic ulcer, upper GI bleed colon cancer of the splenic flexure undergoing laparoscopic colectomy who presented to the emergency department with fatigue, jaundice and hematuria.  Lab work: Urinalysis, Routine w reflex microscopic -Urine, Clean Catch [[id number]] (Abnormal)   Collected: 05/07/23 1001   Updated: 05/07/23 1240   Specimen Source: Urine, Clean Catch    Color, Urine YELLOW   APPearance CLEAR   Specific Gravity, Urine 1.017   pH 6.5   Glucose, UA 500 Abnormal  mg/dL   Hgb urine dipstick MODERATE Abnormal    Bilirubin Urine LARGE Abnormal    Ketones, ur NEGATIVE mg/dL   Protein, ur >272 Abnormal  mg/dL   Nitrite NEGATIVE   Leukocytes,Ua NEGATIVE   RBC / HPF 0-5 RBC/hpf   WBC, UA 0-5 WBC/hpf   Bacteria, UA NONE SEEN   Squamous Epithelial / HPF 0-5 /HPF   Mucus PRESENT   Granular Casts, UA PRESENT   Sperm, UA PRESENT  Comprehensive metabolic panel [[id number]] (Abnormal)   Collected: 05/07/23 1023   Updated: 05/07/23 1131   Specimen Type: Blood   Specimen Source: Vein    Sodium 130 Low  mmol/L   Potassium 3.0 Low  mmol/L   Chloride 94 Low  mmol/L   CO2 24 mmol/L   Glucose, Bld 177 High  mg/dL   BUN 43 High  mg/dL   Creatinine, Ser 7.42 High  mg/dL   Calcium 8.7 Low  mg/dL   Total Protein 6.6 g/dL   Albumin 2.7 Low  g/dL   AST 99 High  U/L   ALT 91 High  U/L   Alkaline Phosphatase 1,070 High  U/L   Total Bilirubin 13.2 High  mg/dL   GFR, Estimated 13 Low  mL/min   Anion gap 12  CBC with Differential [[id number]] (Abnormal)   Collected: 05/07/23 1023   Updated: 05/07/23 1056   Specimen  Type: Blood   Specimen Source: Vein    WBC 8.9 K/uL   RBC 2.55 Low  MIL/uL   Hemoglobin 7.7 Low  g/dL   HCT 43.3 Low  %   MCV 87.8 fL   MCH 30.2 pg   MCHC 34.4 g/dL   RDW 29.5 High  %   Platelets 398 K/uL   nRBC 0.0 %   Neutrophils Relative % 75 %   Neutro Abs 6.6 K/uL   Lymphocytes Relative 11 %   Lymphs Abs 1.0 K/uL   Monocytes Relative 9 %   Monocytes Absolute 0.8 K/uL   Eosinophils Relative 3 %   Eosinophils Absolute 0.3 K/uL   Basophils Relative 1 %   Basophils Absolute 0.1 K/uL   Immature Granulocytes 1 %   Abs Immature Granulocytes 0.12 High  K/uL    Plan of care: The patient is accepted for admission to Progressive unit, at [facility].  Requested noncontrast CT of the abdomen to further characterize new hyperbilirubinemia.  At the ER we will be notifying oncology.  Author: Bobette Mo, MD 05/07/2023  Check [email or web address] for on-call coverage.  Nursing staff, Please call [facility] Admits & Consults System-Wide number on Amion as soon as patient's arrival, so appropriate admitting provider can evaluate the pt.

## 2023-05-07 NOTE — H&P (Addendum)
History and Physical    Patient: Jeffrey Young YNW:[id number] DOB: 1966-04-09 DOA: 05/07/2023 DOS: the patient was seen and examined on 05/07/2023 PCP: Tracey Harries, MD  Patient coming from: Home  Chief Complaint:  Chief Complaint  Patient presents with   Hematuria   HPI: Jeffrey Young is a 57 y.o. male with medical history significant for stage III colon cancer.  The patient tells me he is being monitored but is not getting any active treatment at this time.  He has type 2 diabetes mellitus, unknown diabetic wound on the base of his left foot currently being followed by orthopedic surgery, history of anemia due to GI bleeding, neuropathy, chronic kidney disease with a baseline creatinine of 3.6, and hypertension who presented to the ER today because he has been feeling sick and not urinating normally.  Last week the patient had multiple teeth removed.  He has been taking Advil and ibuprofen for the pain.  Days ago he noticed that he had pink blood in his urine.  That lasted for a couple of days.  2 days ago he noticed that he did not have to urinate at all for most of the day.  He also felt terrible he felt like he had been beat up and his skin felt like it was on fire.  Because of the symptoms he presented to the emergency department.  Workup in the emergency department found that his total bili was 13.  His creatinine was 5 and his hemoglobin was 7.7. The patient gets injections in his eyes and the wife thought the discoloration was from the injections.  Pictures from a month ago revealed that his eyes were slightly jaundiced at that time but it went unnoticed.  Review of Systems: As mentioned in the history of present illness. All other systems reviewed and are negative. Past Medical History:  Diagnosis Date   Arthritis    Hip   Colon cancer (HCC)    Diabetic mononeuropathy associated with type 2 diabetes mellitus (HCC) 03/21/2017   Diabetic retinopathy (HCC) 07/31/2020   Enthesopathy  of ankle and tarsus 04/02/2009   Formatting of this note might be different from the original. 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 Type 2 Diabetes Mellitus - Uncomplicated, Uncontrolled   Past Surgical History:  Procedure Laterality Date   AMPUTATION Left 01/22/2018   Procedure: TRANSMETATARSAL AMPUTATION;  Surgeon: Nadara Mustard, MD;  Location: [facility] OR;  Service: Orthopedics;  Laterality: Left;toes   BIOPSY  07/27/2020   Procedure: BIOPSY;  Surgeon: Jeani Hawking, MD;  Location: [facility];  Service: Endoscopy;;   COLON RESECTION N/A 07/30/2020   Procedure: HAND ASSISTED LAPAROSCOPIC LEFT HEMI COLECTOMY;  Surgeon: Luretha Murphy, MD;  Location: [facility] ORS;  Service: General;  Laterality: N/A;   COLONOSCOPY WITH PROPOFOL N/A 05/24/2018   Procedure: COLONOSCOPY WITH PROPOFOL;  Surgeon: Jeani Hawking, MD;  Location: [facility];  Service: Endoscopy;  Laterality: N/A;   COLONOSCOPY WITH PROPOFOL N/A 07/27/2020   Procedure: COLONOSCOPY WITH PROPOFOL;  Surgeon: Jeani Hawking, MD;  Location: [facility];  Service: Endoscopy;  Laterality: N/A;   ESOPHAGOGASTRODUODENOSCOPY Left 04/05/2021   Procedure: ESOPHAGOGASTRODUODENOSCOPY (EGD);  Surgeon: Jeani Hawking, MD;  Location: [facility];  Service: Endoscopy;  Laterality: Left;   POLYPECTOMY  05/24/2018    Procedure: POLYPECTOMY;  Surgeon: Jeani Hawking, MD;  Location: [facility];  Service: Endoscopy;;   PORTACATH PLACEMENT Left 08/24/2020   Procedure: INSERTION PORT-A-CATH;  Surgeon: Luretha Murphy, MD;  Location: [facility];  Service: General;  Laterality: Left;  75/rm1   SUBMUCOSAL TATTOO INJECTION  07/27/2020   Procedure: SUBMUCOSAL TATTOO INJECTION;  Surgeon: Jeani Hawking, MD;  Location: [facility];  Service: Endoscopy;;   Social History:  reports that he has never smoked. He has never used smokeless tobacco. He reports current alcohol use of about 1.0 standard drink of alcohol per week. He reports that he does not use drugs.  Allergies  Allergen Reactions   Bee Venom Swelling    Cold Sweats   Beeswax Rash   Latex Rash    Family History  Problem Relation Age of Onset   Hypertension Father     Prior to Admission medications   Medication Sig Start Date End Date Taking? Authorizing Provider  amLODipine (NORVASC) 5 MG tablet Take 1 tablet (5 mg total) by mouth daily. 08/04/20  Yes Azucena Fallen, MD  atorvastatin (LIPITOR) 10 MG tablet Take 10 mg by mouth at bedtime. 05/08/19  Yes [provider]  carvedilol (COREG) 12.5 MG tablet Take 12.5 mg by mouth 2 (two) times daily with a meal. 08/23/22  Yes [provider]  docusate sodium (COLACE) 100 MG capsule Take 100 mg by mouth daily as needed for mild constipation.   Yes [provider]  LANTUS SOLOSTAR 100 UNIT/ML Solostar Pen Inject 36 Units into the skin daily.   Yes [provider]  ACCU-CHEK GUIDE test strip USE AS INSTRUCTED TO CHECK BLOOD SUGAR 2 TIMES DAILY 10/18/20   [provider]  KLOR-CON M20 20 MEQ tablet TAKE 1 TABLET BY MOUTH EVERY DAY Patient not taking: Reported on 03/26/2023 04/25/21   Rana Snare, NP  ondansetron (ZOFRAN) 8 MG tablet Take 1 tablet (8 mg total) by mouth every 8 (eight) hours as needed for nausea or vomiting. Start 72 hours after IV chemotherapy  treatment Patient not taking: Reported on 07/07/2022 09/18/20   Benjiman Core, MD  pantoprazole (PROTONIX) 40 MG tablet Take 40 mg by mouth 2 (two) times daily. 01/12/21   [provider]  prochlorperazine (COMPAZINE) 10 MG tablet Take 1 tablet (10 mg total) by mouth every 6 (six) hours as needed for nausea or vomiting. Patient not taking: Reported on 07/07/2022 10/11/20   Ladene Artist, MD    Physical Exam: Vitals:   05/07/23 1800 05/07/23 1830 05/07/23 1930 05/07/23 2022  BP: (!) 173/86 (!) 153/88 137/81 (!) 164/104  Pulse: 68 63 60 73  Resp: 17 16 16 18   Temp:    98.2 F (36.8 C)  TempSrc:    Oral  SpO2: 97% 97% 97% 100%  Weight:      Height:       Physical Exam:  General: No acute distress, well developed, well nourished HEENT: Normocephalic, atraumatic, PERRL Cardiovascular: Normal rate and rhythm. Distal pulses not palpable. Pulmonary: Normal pulmonary effort, normal breath sounds, port in left upper chest Gastrointestinal: Nondistended abdomen, soft, non-tender,  normoactive bowel sounds Musculoskeletal:Normal ROM, no lower ext edema, all the toes on his left foot have been amputated. Skin: Skin is warm and dry. Neuro: No focal deficits noted, AAOx3. PSYCH: Attentive and cooperative  Data Reviewed:  Results for orders placed or performed during the hospital encounter of 05/07/23 (from the past 24 hours)  Urinalysis, Routine w reflex microscopic -Urine, Clean Catch     Status: Abnormal   Collection Time: 05/07/23 10:01 AM  Result Value Ref Range   Color, Urine YELLOW YELLOW   APPearance CLEAR CLEAR   Specific Gravity, Urine 1.017 1.005 - 1.030   pH 6.5 5.0 - 8.0   Glucose, UA 500 (A) NEGATIVE mg/dL   Hgb urine dipstick MODERATE (A) NEGATIVE   Bilirubin Urine LARGE (A) NEGATIVE   Ketones, ur NEGATIVE NEGATIVE mg/dL   Protein, ur >161 (A) NEGATIVE mg/dL   Nitrite NEGATIVE NEGATIVE   Leukocytes,Ua NEGATIVE NEGATIVE   RBC / HPF 0-5 0 - 5 RBC/hpf   WBC, UA  0-5 0 - 5 WBC/hpf   Bacteria, UA NONE SEEN NONE SEEN   Squamous Epithelial / HPF 0-5 0 - 5 /HPF   Mucus PRESENT    Granular Casts, UA PRESENT    Sperm, UA PRESENT   CBC with Differential     Status: Abnormal   Collection Time: 05/07/23 10:23 AM  Result Value Ref Range   WBC 8.9 4.0 - 10.5 K/uL   RBC 2.55 (L) 4.22 - 5.81 MIL/uL   Hemoglobin 7.7 (L) 13.0 - 17.0 g/dL   HCT 09.6 (L) 04.5 - 40.9 %   MCV 87.8 80.0 - 100.0 fL   MCH 30.2 26.0 - 34.0 pg   MCHC 34.4 30.0 - 36.0 g/dL   RDW 81.1 (H) 91.4 - 78.2 %   Platelets 398 150 - 400 K/uL   nRBC 0.0 0.0 - 0.2 %   Neutrophils Relative % 75 %   Neutro Abs 6.6 1.7 - 7.7 K/uL   Lymphocytes Relative 11 %   Lymphs Abs 1.0 0.7 - 4.0 K/uL   Monocytes Relative 9 %   Monocytes Absolute 0.8 0.1 - 1.0 K/uL   Eosinophils Relative 3 %   Eosinophils Absolute 0.3 0.0 - 0.5 K/uL   Basophils Relative 1 %   Basophils Absolute 0.1 0.0 - 0.1 K/uL   Immature Granulocytes 1 %   Abs Immature Granulocytes 0.12 (H) 0.00 - 0.07 K/uL  Comprehensive metabolic panel     Status: Abnormal   Collection Time: 05/07/23 10:23 AM  Result Value Ref Range   Sodium 130 (L) 135 - 145 mmol/L   Potassium 3.0 (L) 3.5 - 5.1 mmol/L   Chloride 94 (L) 98 - 111 mmol/L   CO2 24 22 - 32 mmol/L   Glucose, Bld 177 (H) 70 - 99 mg/dL   BUN 43 (H) 6 - 20 mg/dL   Creatinine, Ser 9.56 (H) 0.61 - 1.24 mg/dL   Calcium 8.7 (L) 8.9 - 10.3 mg/dL   Total Protein 6.6 6.5 - 8.1 g/dL   Albumin 2.7 (L) 3.5 - 5.0 g/dL   AST 99 (H) 15 - 41 U/L   ALT 91 (H) 0 - 44 U/L   Alkaline Phosphatase 1,070 (H) 38 - 126 U/L   Total Bilirubin 13.2 (H) <1.2 mg/dL   GFR, Estimated 13 (L) >60 mL/min   Anion gap 12 5 - 15    IMPRESSION: 1. New intrahepatic and extrahepatic biliary duct dilatation with possible associated periportal edema. Common duct is dilated  up to 15 mm diameter and appears to abruptly terminate prior to entering the head of the pancreas. Imaging features are compatible with biliary  obstruction, potentially secondary to the hepatoduodenal ligament lymphadenopathy seen previously. Assessment is limited due to lack of intravenous contrast material. 2. Distended gallbladder with possible gallbladder wall thickening and pericholecystic edema. Right upper quadrant ultrasound recommended to evaluate for acute cholecystitis. 3. No substantial change in lymphadenopathy of the gastrohepatic ligament, hepatoduodenal ligament, and left external iliac chain. 4. Tiny hypoattenuating foci seen previously in the dome of the left liver are not evident on the current exam. 5. Mild circumferential wall thickening distal esophagus. Esophagitis would be a consideration. 6. Small left groin hernia contains only fat.   Assessment and Plan: Painless jaundice/ known Colon cancer - - consult GI ([facility]) - Consult Dr. Truett Perna of oncology - NPO after midnight  2.  Possible cholecystitis - RUQ [place] as recommended.  He has no fever and no pain but he does have chronic severe acid reflux. -Will start cefepime because of the possibility of cholecystitis.  3. Acute on chronic renal failure -patient's creatinine was 3.6 at baseline prior to him taking ibuprofen and Advil for a week.  -Hydrate and monitor creatinine and urine output  4.  Post blood loss anemia - Transfuse for hemoglobin less than 7  5.  Severe GERD - possible esophagitis noted on CT.  - Continue bid Protonix - Evaluate Gallbladder   6. S/p removal of multiple teeth - soft food when diet resumes  7. DMT2 - Holding insulin while NPO  8. Diabetic left foot ulcer - Wound care consult  9.  Hypertension - continue home medications once his list is reconciled.   Advance Care Planning:   Code Status: Prior the patient names his wife as his surrogate decision maker and wants to be full code.  Consults: None yet  Family Communication: [name] is at bedside  Severity of Illness: The appropriate patient status for this  patient is INPATIENT. Inpatient status is judged to be reasonable and necessary in order to provide the required intensity of service to ensure the patient's safety. The patient's presenting symptoms, physical exam findings, and initial radiographic and laboratory data in the context of their chronic comorbidities is felt to place them at high risk for further clinical deterioration. Furthermore, it is not anticipated that the patient will be medically stable for discharge from the hospital within 2 midnights of admission.   * I certify that at the point of admission it is my clinical judgment that the patient will require inpatient hospital care spanning beyond 2 midnights from the point of admission due to high intensity of service, high risk for further deterioration and high frequency of surveillance required.*  Author: Buena Irish, MD 05/07/2023 8:25 PM  For on call review [email or web address].

## 2023-05-07 NOTE — ED Notes (Signed)
Pt requested meds for nausea and meds for acid reflux... Provider informed.Marland KitchenMarland Kitchen

## 2023-05-07 NOTE — ED Provider Notes (Signed)
[place] EMERGENCY DEPARTMENT AT [facility] Provider Note   CSN: [id number] Arrival date & time: 05/07/23  7782     History  Chief Complaint  Patient presents with   Hematuria    Jeffrey Young is a 57 y.o. male.   Hematuria  Patient presents to the ED with a 3-day history of hematuria.  Previous history of uncontrolled type 2 diabetes, hypertension, colon cancer, heart failure, AKI, GI bleed.  States that bleeding has been intermittent and painless.  Denies gross hematuria.  Normal bowel movements without hematochezia or melena.  Endorses acid reflux but states that these are chronic in nature.  Has not been taking Protonix.     Home Medications Prior to Admission medications   Medication Sig Start Date End Date Taking? Authorizing Provider  amLODipine (NORVASC) 5 MG tablet Take 1 tablet (5 mg total) by mouth daily. 08/04/20  Yes Azucena Fallen, MD  atorvastatin (LIPITOR) 10 MG tablet Take 10 mg by mouth at bedtime. 05/08/19  Yes [provider]  carvedilol (COREG) 12.5 MG tablet Take 12.5 mg by mouth 2 (two) times daily with a meal. 08/23/22  Yes [provider]  docusate sodium (COLACE) 100 MG capsule Take 100 mg by mouth daily as needed for mild constipation.   Yes [provider]  LANTUS SOLOSTAR 100 UNIT/ML Solostar Pen Inject 36 Units into the skin daily.   Yes [provider]  ACCU-CHEK GUIDE test strip USE AS INSTRUCTED TO CHECK BLOOD SUGAR 2 TIMES DAILY 10/18/20   [provider]  KLOR-CON M20 20 MEQ tablet TAKE 1 TABLET BY MOUTH EVERY DAY Patient not taking: Reported on 03/26/2023 04/25/21   Rana Snare, NP  ondansetron (ZOFRAN) 8 MG tablet Take 1 tablet (8 mg total) by mouth every 8 (eight) hours as needed for nausea or vomiting. Start 72 hours after IV chemotherapy treatment Patient not taking: Reported on 07/07/2022 09/18/20   Benjiman Core, MD  pantoprazole (PROTONIX) 40 MG tablet Take 40 mg by mouth 2  (two) times daily. 01/12/21   [provider]  prochlorperazine (COMPAZINE) 10 MG tablet Take 1 tablet (10 mg total) by mouth every 6 (six) hours as needed for nausea or vomiting. Patient not taking: Reported on 07/07/2022 10/11/20   Ladene Artist, MD      Allergies    Bee venom, Beeswax, and Latex    Review of Systems   Review of Systems  Genitourinary:  Positive for hematuria.  All other systems reviewed and are negative.   Physical Exam Updated Vital Signs BP (!) 180/90   Pulse 71   Temp 98.5 F (36.9 C) (Oral)   Resp 16   Ht 6\' 1"  (1.854 m)   Wt 87 kg   SpO2 96%   BMI 25.30 kg/m  Physical Exam Vitals and nursing note reviewed.  Constitutional:      General: He is not in acute distress.    Appearance: Normal appearance.  HENT:     Head: Normocephalic and atraumatic.     Right Ear: External ear normal.     Left Ear: External ear normal.     Nose: No congestion or rhinorrhea.     Mouth/Throat:     Mouth: Mucous membranes are moist.     Pharynx: Oropharynx is clear. No oropharyngeal exudate or posterior oropharyngeal erythema.     Comments: Most dentition are missing as that having just been pulled within the last month. Eyes:     General: Scleral  icterus present.        Right eye: No discharge.        Left eye: No discharge.     Extraocular Movements: Extraocular movements intact.     Conjunctiva/sclera: Conjunctivae normal.  Cardiovascular:     Rate and Rhythm: Normal rate and regular rhythm.     Pulses: Normal pulses.     Heart sounds: Normal heart sounds. No murmur heard. Pulmonary:     Effort: Pulmonary effort is normal. No respiratory distress.     Breath sounds: Normal breath sounds. No wheezing, rhonchi or rales.  Chest:     Chest wall: No tenderness.  Abdominal:     General: Abdomen is flat.     Palpations: Abdomen is soft.     Tenderness: There is no abdominal tenderness. There is no right CVA tenderness, left CVA tenderness, guarding or  rebound.  Lymphadenopathy:     Cervical: No cervical adenopathy.  Skin:    General: Skin is warm and dry.     Coloration: Skin is jaundiced.     Findings: No erythema.  Neurological:     General: No focal deficit present.     Mental Status: He is alert. Mental status is at baseline.  Psychiatric:        Mood and Affect: Mood normal.     ED Results / Procedures / Treatments   Labs (all labs ordered are listed, but only abnormal results are displayed) Labs Reviewed  CBC WITH DIFFERENTIAL/PLATELET - Abnormal; Notable for the following components:      Result Value   RBC 2.55 (*)    Hemoglobin 7.7 (*)    HCT 22.4 (*)    RDW 16.3 (*)    Abs Immature Granulocytes 0.12 (*)    All other components within normal limits  COMPREHENSIVE METABOLIC PANEL - Abnormal; Notable for the following components:   Sodium 130 (*)    Potassium 3.0 (*)    Chloride 94 (*)    Glucose, Bld 177 (*)    BUN 43 (*)    Creatinine, Ser 5.02 (*)    Calcium 8.7 (*)    Albumin 2.7 (*)    AST 99 (*)    ALT 91 (*)    Alkaline Phosphatase 1,070 (*)    Total Bilirubin 13.2 (*)    GFR, Estimated 13 (*)    All other components within normal limits  URINALYSIS, ROUTINE W REFLEX MICROSCOPIC - Abnormal; Notable for the following components:   Glucose, UA 500 (*)    Hgb urine dipstick MODERATE (*)    Bilirubin Urine LARGE (*)    Protein, ur >300 (*)    All other components within normal limits    EKG EKG Interpretation Date/Time:  Monday May 07 2023 11:11:10 EST Ventricular Rate:  75 PR Interval:  162 QRS Duration:  101 QT Interval:  430 QTC Calculation: 481 R Axis:   -80  Text Interpretation: Sinus rhythm Left anterior fascicular block Minimal ST depression, inferior leads Borderline prolonged QT interval No significant change since last tracing Confirmed by Derwood Kaplan [id number] on 05/07/2023 12:37:34 PM  Radiology CT ABDOMEN PELVIS WO CONTRAST Result Date: 05/07/2023 CLINICAL DATA:   History of colon cancer, metastatic. No which on this and worsening renal failure. * Tracking Code: BO * EXAM: CT ABDOMEN AND PELVIS WITHOUT CONTRAST TECHNIQUE: Multidetector CT imaging of the abdomen and pelvis was performed following the standard protocol without IV contrast. RADIATION DOSE REDUCTION: This exam was performed according to the  departmental dose-optimization program which includes automated exposure control, adjustment of the mA and/or kV according to patient size and/or use of iterative reconstruction technique. COMPARISON:  03/05/2023 FINDINGS: Lower chest: Dependent atelectasis in the lung bases. Hepatobiliary: Intrahepatic biliary duct dilatation is new in the interval. There may be some associated periportal edema. Portal venous anatomy is prominent and ill-defined. Density in the portal vein is similar to background blood pool density in the aorta. Common duct is dilated up to 15 mm diameter and appears to abruptly terminate prior to entering the head of the pancreas. Gallbladder is distended with ill definition of the gallbladder wall and potential pericholecystic edema/fluid. Pancreas: No focal mass lesion. No dilatation of the main duct. No intraparenchymal cyst. No peripancreatic edema. Spleen: No splenomegaly. No suspicious focal mass lesion. Adrenals/Urinary Tract: No adrenal nodule or mass. Stable right renal cyst. No followup imaging is recommended. Left kidney unremarkable on noncontrast imaging. No hydroureteronephrosis. Bladder is decompressed. Stomach/Bowel: Mild circumferential wall thickening noted distal esophagus. Stomach is unremarkable. No gastric wall thickening. No evidence of outlet obstruction. Duodenum is normally positioned as is the ligament of Treitz. No small bowel wall thickening. No small bowel dilatation. The terminal ileum is normal. The appendix is normal. Status post left hemicolectomy with reanastomosis. Vascular/Lymphatic: No abdominal aortic aneurysm. No  abdominal aortic atherosclerotic calcification. Small lymph nodes in the gastrohepatic ligament (image 21/2) are similar to prior. Small lymph nodes also noted hepatoduodenal ligament. Index 14 mm short axis hepatoduodenal ligament node measured previously is 16 mm on 27/2 today. Index left external iliac lymph node measured previously at 14 mm short axis is 14 mm again today on 76/2. Other small lymph nodes along the left pelvic sidewall again noted. Reproductive: The prostate gland and seminal vesicles are unremarkable. Other: No intraperitoneal free fluid. Musculoskeletal: Small left groin hernia contains only fat. No worrisome lytic or sclerotic osseous abnormality. IMPRESSION: 1. New intrahepatic and extrahepatic biliary duct dilatation with possible associated periportal edema. Common duct is dilated up to 15 mm diameter and appears to abruptly terminate prior to entering the head of the pancreas. Imaging features are compatible with biliary obstruction, potentially secondary to the hepatoduodenal ligament lymphadenopathy seen previously. Assessment is limited due to lack of intravenous contrast material. 2. Distended gallbladder with possible gallbladder wall thickening and pericholecystic edema. Right upper quadrant ultrasound recommended to evaluate for acute cholecystitis. 3. No substantial change in lymphadenopathy of the gastrohepatic ligament, hepatoduodenal ligament, and left external iliac chain. 4. Tiny hypoattenuating foci seen previously in the dome of the left liver are not evident on the current exam. 5. Mild circumferential wall thickening distal esophagus. Esophagitis would be a consideration. 6. Small left groin hernia contains only fat. Electronically Signed   By: Kennith Center M.D.   On: 05/07/2023 14:04    Procedures .Critical Care  Performed by: Lunette Stands, PA-C Authorized by: Lunette Stands, PA-C   Critical care provider statement:    Critical care time (minutes):  32    Critical care time was exclusive of:  Separately billable procedures and treating other patients   Critical care was necessary to treat or prevent imminent or life-threatening deterioration of the following conditions:  Hepatic failure   Critical care was time spent personally by me on the following activities:  Ordering and performing treatments and interventions, ordering and review of laboratory studies, ordering and review of radiographic studies, pulse oximetry, re-evaluation of patient's condition, review of old charts, obtaining history from patient or surrogate, examination  of patient, evaluation of patient's response to treatment, development of treatment plan with patient or surrogate and discussions with consultants     Medications Ordered in ED Medications  sodium chloride 0.9 % bolus 1,000 mL (0 mLs Intravenous Stopped 05/07/23 1444)  pantoprazole (PROTONIX) EC tablet 40 mg (40 mg Oral Given 05/07/23 1354)    ED Course/ Medical Decision Making/ A&P                                 Medical Decision Making Amount and/or Complexity of Data Reviewed Labs: ordered.   This patient is a 57 year old male who presents to the ED for concern of hematuria, GERD, low back pain.   Differential diagnoses prior to evaluation: The emergent differential diagnosis includes, but is not limited to, cystitis, tumor, trauma, AKI, ACS, nephrolithiasis. This is not an exhaustive differential.   Past Medical History / Co-morbidities / Social History: Uncontrolled type 2 diabetes, hypertension, colon cancer, heart failure, AKI, GI bleed.   Additional history: Chart reviewed. Pertinent results include: Previously seen by Dr. Truett Perna on 03/26/2023 for a sending colon cancer stage III status post partial left colectomy.  Lab Tests/Imaging studies: I personally interpreted labs/imaging and the pertinent results include:   CMP shows mild hyponatremia, mild hypokalemia, elevated BUN from normal,  elevated creatinine from normal patient does not, low albumin and elevated liver enzymes with patient elevated alk phos CT scan and significantly elevated bilirubin at 13.2.   CT abdomen/pelvis showed:  1. New intrahepatic and extrahepatic biliary duct dilatation with possible associated periportal edema. Common duct is dilated up to 15 mm diameter and appears to abruptly terminate prior to entering the head of the pancreas. Imaging features are compatible with biliary obstruction, potentially secondary to the hepatoduodenal ligament lymphadenopathy seen previously. Assessment is limited due to lack of intravenous contrast material.  2. Distended gallbladder with possible gallbladder wall thickening and pericholecystic edema. Right upper quadrant ultrasound recommended to evaluate for acute cholecystitis. 3. No substantial change in lymphadenopathy of the gastrohepatic ligament, hepatoduodenal ligament, and left external iliac chain. 4. Tiny hypoattenuating foci seen previously in the dome of the left liver are not evident on the current exam. 5. Mild circumferential wall thickening distal esophagus. Esophagitis would be a consideration. 6. Small left groin hernia contains only fat.   I agree with the radiologist interpretation.  Cardiac monitoring: EKG obtained and interpreted by myself and attending physician which shows:  Sinus rhythm Left anterior fascicular block Minimal ST depression, inferior leads Borderline prolonged QT interval   Medications: I ordered medication including Protonix for GERD and NS.  I have reviewed the patients home medicines and have made adjustments as needed.  Critical Interventions:  Hepatic failure discovered by CMP with elevated liver enzymes and a bilirubin of 13.2 and alk phos of 1070.   ED Course:  Patient is 57 year old male who presents to the ED with hematuria, acid reflux.  Previous medical history of colon cancer, he, GI bleeds, hypertension,  hypokalemia, systolic heart failure, uncontrolled type 2 diabetes.  Has a Port-A-Cath placement.  Hematuria has been happening for the last 3 days.  Endorses acid reflux is chronic in nature.  Upon evaluation, patient appears jaundiced.  He does not appear to be in any acute distress.  No abdominal tenderness, CVA tenderness on exam.  Provided Protonix for GERD symptoms which were relieved.  Patient is being followed by oncology for stage III colon cancer.  Hematuria and jaundice appears to be possibly cancer related.  Patient's vitals are stable at this time.  CT scan showed hepatic congestion, distended gallbladder, liver foci, and possible esophagitis.  Imaging combined with bilirubin, elevated liver enzymes, elevated alk phos indicate hepatic failure. Worsening kidney function also noted with rising Cr, BUN from baseline. Hgb was 7.7 which is lower than baseline but due to vitals and patient appearing stable, not a candidate for transfusion.   Patient discussed with attending.  Who helped form plan and contacted admitting provider to admit patient to [facility].  Oncologist Dr.Sherrill was alerted and will follow patient at [facility]. As further workup to discover cause of hepatic failure and hematuria is required.   Disposition: After consideration of the diagnostic results and the patients response to treatment, I feel that the patient would benefit from admission.   Patient is to be admitted to [facility].  Attending orchestrated transfer to [facility].  Dr. Truett Perna will continue to follow patient in hospital    Final Clinical Impression(s) / ED Diagnoses Final diagnoses:  Hematuria, unspecified type  Elevated bilirubin    Rx / DC Orders ED Discharge Orders     None         Lunette Stands, PA-C 05/07/23 1459    Derwood Kaplan, MD 05/08/23 [phone]

## 2023-05-07 NOTE — ED Triage Notes (Signed)
In for eval of intermittent blood in urine, acid reflux, and vomiting.  Had mouth surgery and jaw pain - took advil and ibuprofen, noticed blood in urine on Thursday.

## 2023-05-07 NOTE — ED Notes (Signed)
Report given to the floor RN.

## 2023-05-07 NOTE — Progress Notes (Signed)
Patient arrived to unit via [facility]. A&Ox4, no reports of pain, VSS, report received from [place], paramedic.

## 2023-05-07 NOTE — ED Notes (Signed)
Report given to [facility]. 

## 2023-05-07 NOTE — ED Notes (Signed)
Pt informed of the delay... Family and Pt aware and understood.

## 2023-05-08 ENCOUNTER — Inpatient Hospital Stay (HOSPITAL_COMMUNITY): Payer: Commercial Managed Care - PPO

## 2023-05-08 DIAGNOSIS — N184 Chronic kidney disease, stage 4 (severe): Secondary | ICD-10-CM

## 2023-05-08 LAB — COMPREHENSIVE METABOLIC PANEL
ALT: 83 U/L — ABNORMAL HIGH (ref 0–44)
AST: 95 U/L — ABNORMAL HIGH (ref 15–41)
Albumin: 1.9 g/dL — ABNORMAL LOW (ref 3.5–5.0)
Alkaline Phosphatase: 1008 U/L — ABNORMAL HIGH (ref 38–126)
Anion gap: 10 (ref 5–15)
BUN: 42 mg/dL — ABNORMAL HIGH (ref 6–20)
CO2: 22 mmol/L (ref 22–32)
Calcium: 8.1 mg/dL — ABNORMAL LOW (ref 8.9–10.3)
Chloride: 101 mmol/L (ref 98–111)
Creatinine, Ser: 4.75 mg/dL — ABNORMAL HIGH (ref 0.61–1.24)
GFR, Estimated: 13 mL/min — ABNORMAL LOW (ref >60)
Glucose, Bld: 182 mg/dL — ABNORMAL HIGH (ref 70–99)
Potassium: 2.9 mmol/L — ABNORMAL LOW (ref 3.5–5.1)
Sodium: 133 mmol/L — ABNORMAL LOW (ref 135–145)
Total Bilirubin: 11.8 mg/dL — ABNORMAL HIGH (ref <1.2)
Total Protein: 5.9 g/dL — ABNORMAL LOW (ref 6.5–8.1)

## 2023-05-08 LAB — CBC
HCT: 20.5 % — ABNORMAL LOW (ref 39.0–52.0)
Hemoglobin: 6.9 g/dL — CL (ref 13.0–17.0)
MCH: 30.5 pg (ref 26.0–34.0)
MCHC: 33.7 g/dL (ref 30.0–36.0)
MCV: 90.7 fL (ref 80.0–100.0)
Platelets: 344 10*3/uL (ref 150–400)
RBC: 2.26 MIL/uL — ABNORMAL LOW (ref 4.22–5.81)
RDW: 17.1 % — ABNORMAL HIGH (ref 11.5–15.5)
WBC: 7.7 10*3/uL (ref 4.0–10.5)
nRBC: 0 % (ref 0.0–0.2)

## 2023-05-08 LAB — GLUCOSE, CAPILLARY
Glucose-Capillary: 163 mg/dL — ABNORMAL HIGH (ref 70–99)
Glucose-Capillary: 143 mg/dL — ABNORMAL HIGH (ref 70–99)
Glucose-Capillary: 222 mg/dL — ABNORMAL HIGH (ref 70–99)
Glucose-Capillary: 157 mg/dL — ABNORMAL HIGH (ref 70–99)

## 2023-05-08 LAB — MAGNESIUM: Magnesium: 1.9 mg/dL (ref 1.7–2.4)

## 2023-05-08 LAB — PREPARE RBC (CROSSMATCH)

## 2023-05-08 LAB — HEMOGLOBIN A1C
Hgb A1c MFr Bld: 5.4 % (ref 4.8–5.6)
Mean Plasma Glucose: 108.28 mg/dL

## 2023-05-08 LAB — HIV ANTIBODY (ROUTINE TESTING W REFLEX): HIV Screen 4th Generation wRfx: NONREACTIVE

## 2023-05-08 MED ORDER — INSULIN GLARGINE-YFGN 100 UNIT/ML ~~LOC~~ SOLN
10.0000 [IU] | Freq: Two times a day (BID) | SUBCUTANEOUS | Status: DC
  Administered 2023-05-08 – 2023-05-19 (×21): 10 [IU] via SUBCUTANEOUS
  Filled 2023-05-08 (×26): qty 0.1

## 2023-05-08 MED ORDER — INSULIN ASPART 100 UNIT/ML IJ SOLN
0.0000 [IU] | Freq: Three times a day (TID) | INTRAMUSCULAR | Status: DC
  Administered 2023-05-08 – 2023-05-11 (×3): 1 [IU] via SUBCUTANEOUS
  Administered 2023-05-11: 2 [IU] via SUBCUTANEOUS
  Administered 2023-05-15 – 2023-05-20 (×4): 1 [IU] via SUBCUTANEOUS

## 2023-05-08 MED ORDER — POTASSIUM CHLORIDE 10 MEQ/100ML IV SOLN
10.0000 meq | INTRAVENOUS | Status: DC
Start: 2023-05-08 — End: 2023-05-08
  Administered 2023-05-08: 10 meq via INTRAVENOUS
  Filled 2023-05-08: qty 100

## 2023-05-08 MED ORDER — CIPROFLOXACIN HCL 500 MG PO TABS
500.0000 mg | ORAL_TABLET | Freq: Every day | ORAL | Status: DC
Start: 2023-05-08 — End: 2023-05-14
  Administered 2023-05-08 – 2023-05-13 (×6): 500 mg via ORAL
  Filled 2023-05-08 (×6): qty 1

## 2023-05-08 MED ORDER — SODIUM CHLORIDE 0.9 % IV SOLN: 1.0000 g | Freq: Two times a day (BID) | INTRAVENOUS | Status: DC

## 2023-05-08 MED ORDER — POTASSIUM CHLORIDE 20 MEQ PO PACK
40.0000 meq | PACK | Freq: Once | ORAL | Status: AC
  Administered 2023-05-08: 40 meq via ORAL
  Filled 2023-05-08: qty 2

## 2023-05-08 MED ORDER — CARVEDILOL 12.5 MG PO TABS
12.5000 mg | ORAL_TABLET | Freq: Two times a day (BID) | ORAL | Status: DC
  Administered 2023-05-08 – 2023-05-22 (×27): 12.5 mg via ORAL
  Filled 2023-05-08 (×29): qty 1

## 2023-05-08 MED ORDER — SODIUM CHLORIDE 0.9 % IV SOLN
2.0000 g | INTRAVENOUS | Status: DC
  Administered 2023-05-08: 2 g via INTRAVENOUS
  Filled 2023-05-08: qty 12.5

## 2023-05-08 MED ORDER — POTASSIUM CHLORIDE CRYS ER 20 MEQ PO TBCR
40.0000 meq | EXTENDED_RELEASE_TABLET | ORAL | Status: AC
Start: 2023-05-08 — End: 2023-05-08
  Administered 2023-05-08: 40 meq via ORAL
  Filled 2023-05-08 (×2): qty 2

## 2023-05-08 MED ORDER — SODIUM CHLORIDE 0.9 % IV BOLUS
1500.0000 mL | Freq: Once | INTRAVENOUS | Status: AC
  Administered 2023-05-08: 1500 mL via INTRAVENOUS

## 2023-05-08 MED ORDER — AMLODIPINE BESYLATE 10 MG PO TABS
5.0000 mg | ORAL_TABLET | Freq: Every day | ORAL | Status: DC
  Administered 2023-05-08 – 2023-05-22 (×15): 5 mg via ORAL
  Filled 2023-05-08 (×15): qty 1

## 2023-05-08 MED ORDER — SODIUM CHLORIDE 0.9% IV SOLUTION: Freq: Once | INTRAVENOUS | Status: AC

## 2023-05-08 NOTE — Consult Note (Signed)
Reason for Consult:Obstructive Jaundice Referring Physician: [facility]  Rosebud Poles HPI: This is a 57 year old male with a PMH of Stage III descending colon cancer, DM with complications, HTN, hyperlipidemia, and CHF admitted for obstructive jaundice.  He initially presented to the ER with complaints of hematuria and feeling poorly.  This past weekend he reported that his skin was "on fire" and there is a pruritic component to his skin sensation.  Upon admission he was noted to have an HGB of 7.7 g/dL and a creatinine at 5.  His liver enzymes were markedly elevated:  AST 99, ALT 91, AP 1070, and TB 13.2.  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  A 1.1 cm segment 2 liver lesion was also identified.  The intrahepatic and extrahepatic ducts were dilated.  The patient denies any fever and he does not report any abdominal pain.  His WBC on admission was normal and it remains normal.  The patient did successfully complete treatment for his colon cancer.  Past Medical History:  Diagnosis Date   Arthritis    Hip   Colon cancer (HCC)    Diabetic mononeuropathy associated with type 2 diabetes mellitus (HCC) 03/21/2017   Diabetic retinopathy (HCC) 07/31/2020   Enthesopathy of ankle and tarsus 04/02/2009   Formatting of this note might be different from the original. 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 Type 2 Diabetes Mellitus - Uncomplicated, Uncontrolled    Past Surgical History:  Procedure Laterality Date   AMPUTATION Left 01/22/2018   Procedure: TRANSMETATARSAL AMPUTATION;  Surgeon: Nadara Mustard, MD;  Location: [facility] OR;  Service: Orthopedics;  Laterality: Left;toes   BIOPSY  07/27/2020   Procedure: BIOPSY;  Surgeon: Jeani Hawking, MD;  Location: [facility];  Service: Endoscopy;;   COLON RESECTION N/A 07/30/2020   Procedure: HAND ASSISTED LAPAROSCOPIC LEFT HEMI COLECTOMY;  Surgeon: Luretha Murphy, MD;  Location: [facility] ORS;  Service: General;  Laterality: N/A;   COLONOSCOPY WITH PROPOFOL N/A 05/24/2018   Procedure: COLONOSCOPY WITH PROPOFOL;  Surgeon: Jeani Hawking, MD;  Location: [facility];  Service: Endoscopy;  Laterality: N/A;   COLONOSCOPY WITH PROPOFOL N/A 07/27/2020   Procedure: COLONOSCOPY WITH PROPOFOL;  Surgeon: Jeani Hawking, MD;  Location: [facility];  Service: Endoscopy;  Laterality: N/A;   ESOPHAGOGASTRODUODENOSCOPY Left 04/05/2021   Procedure: ESOPHAGOGASTRODUODENOSCOPY (EGD);  Surgeon: Jeani Hawking, MD;  Location: [facility];  Service: Endoscopy;  Laterality: Left;   POLYPECTOMY  05/24/2018   Procedure: POLYPECTOMY;  Surgeon: Jeani Hawking, MD;  Location: [facility];  Service: Endoscopy;;   PORTACATH PLACEMENT Left 08/24/2020   Procedure: INSERTION PORT-A-CATH;  Surgeon: Luretha Murphy, MD;  Location: [facility];  Service: General;  Laterality: Left;  75/rm1   SUBMUCOSAL TATTOO INJECTION  07/27/2020   Procedure: SUBMUCOSAL TATTOO INJECTION;  Surgeon: Jeani Hawking, MD;  Location: [facility];  Service: Endoscopy;;    Family History  Problem Relation Age of Onset   Hypertension Father     Social History:  reports that he has never smoked. He has never used smokeless tobacco. He reports current alcohol use of about 1.0 standard drink of alcohol  per week. He reports that he does not use drugs.  Allergies:  Allergies  Allergen Reactions   Bee Venom Anaphylaxis, Swelling and Other (See Comments)    Cold Sweats, also   Latex Rash    Medications: Scheduled:  amLODipine  5 mg Oral Daily   carvedilol  12.5 mg Oral BID WC   Chlorhexidine Gluconate Cloth  6 each Topical Daily   insulin aspart  0-6 Units Subcutaneous TID WC   insulin glargine-yfgn  10 Units Subcutaneous BID   potassium chloride  40 mEq Oral Once   potassium chloride  40 mEq Oral Q4H   sodium chloride flush  10-40 mL Intracatheter Q12H   Continuous:  sodium chloride 100 mL/hr at 05/07/23 2213   sodium chloride      Results for orders placed or performed during the hospital encounter of 05/07/23 (from the past 24 hours)  CBC     Status: Abnormal   Collection Time: 05/07/23 10:38 PM  Result Value Ref Range   WBC 8.1 4.0 - 10.5 K/uL   RBC 2.43 (L) 4.22 - 5.81 MIL/uL   Hemoglobin 7.4 (L) 13.0 - 17.0 g/dL   HCT 40.9 (L) 81.1 - 91.4 %   MCV 90.9 80.0 - 100.0 fL   MCH 30.5 26.0 - 34.0 pg   MCHC 33.5 30.0 - 36.0 g/dL   RDW 78.2 (H) 95.6 - 21.3 %   Platelets 346 150 - 400 K/uL   nRBC 0.0 0.0 - 0.2 %  Type and screen  [facility]     Status: None (Preliminary result)   Collection Time: 05/07/23 10:38 PM  Result Value Ref Range   ABO/RH(D) A POS    Antibody Screen NEG    Sample Expiration 05/10/2023,2359    Unit Number [id number]    Blood Component Type RED CELLS,LR    Unit division 00    Status of Unit ISSUED    Transfusion Status OK TO TRANSFUSE    Crossmatch Result      Compatible Performed at [facility], [address] [place], [place] [address]   CBC     Status: Abnormal   Collection Time: 05/08/23  4:10 AM  Result Value Ref Range   WBC 7.7 4.0 - 10.5 K/uL   RBC 2.26 (L) 4.22 - 5.81 MIL/uL   Hemoglobin 6.9 (LL) 13.0 - 17.0 g/dL   HCT 24.4 (L) 01.0 - 27.2 %   MCV 90.7 80.0 - 100.0 fL   MCH 30.5 26.0 -  34.0 pg   MCHC 33.7 30.0 - 36.0 g/dL   RDW 53.6 (H) 64.4 - 03.4 %   Platelets 344 150 - 400 K/uL   nRBC 0.0 0.0 - 0.2 %  Comprehensive metabolic panel     Status: Abnormal   Collection Time: 05/08/23  4:10 AM  Result Value Ref Range   Sodium 133 (L) 135 - 145 mmol/L   Potassium 2.9 (L) 3.5 - 5.1 mmol/L   Chloride 101 98 - 111 mmol/L   CO2 22 22 - 32 mmol/L   Glucose, Bld 182 (H) 70 - 99 mg/dL   BUN 42 (H) 6 - 20 mg/dL   Creatinine, Ser 7.42 (H) 0.61 -  1.24 mg/dL   Calcium 8.1 (L) 8.9 - 10.3 mg/dL   Total Protein 5.9 (L) 6.5 - 8.1 g/dL   Albumin 1.9 (L) 3.5 - 5.0 g/dL   AST 95 (H) 15 - 41 U/L   ALT 83 (H) 0 - 44 U/L   Alkaline Phosphatase 1,008 (H) 38 - 126 U/L   Total Bilirubin 11.8 (H) <1.2 mg/dL   GFR, Estimated 13 (L) >60 mL/min   Anion gap 10 5 - 15  Hemoglobin A1c     Status: None   Collection Time: 05/08/23  4:10 AM  Result Value Ref Range   Hgb A1c MFr Bld 5.4 4.8 - 5.6 %   Mean Plasma Glucose 108.28 mg/dL  HIV Antibody (routine testing w rflx)     Status: None   Collection Time: 05/08/23  4:10 AM  Result Value Ref Range   HIV Screen 4th Generation wRfx Non Reactive Non Reactive  Magnesium     Status: None   Collection Time: 05/08/23  4:10 AM  Result Value Ref Range   Magnesium 1.9 1.7 - 2.4 mg/dL  Prepare RBC (crossmatch)     Status: None   Collection Time: 05/08/23  6:12 AM  Result Value Ref Range   Order Confirmation      ORDER PROCESSED BY BLOOD BANK Performed at [facility], [address] [place], [place] [address]   Glucose, capillary     Status: Abnormal   Collection Time: 05/08/23  7:44 AM  Result Value Ref Range   Glucose-Capillary 163 (H) 70 - 99 mg/dL  Glucose, capillary     Status: Abnormal   Collection Time: 05/08/23 12:35 PM  Result Value Ref Range   Glucose-Capillary 143 (H) 70 - 99 mg/dL     US Abdomen Limited RUQ (LIVER/GB) Result Date: 05/08/2023 CLINICAL DATA:  Painless jaundice Cholecystitis EXAM: ULTRASOUND  ABDOMEN LIMITED RIGHT UPPER QUADRANT COMPARISON:  MRI abdomen nodule 06/07/2022 FINDINGS: Gallbladder: Gallstones: Present Sludge: Present Gallbladder Wall: Within normal limits Pericholecystic fluid: Minimal Sonographic Murphy's Sign: Negative per technologist Common bile duct: Diameter: 13 mm Liver: Parenchymal echogenicity: Within normal limits Contours: Normal Lesions: None Portal vein: Patent.  Hepatopetal flow Other: None. IMPRESSION: 1. Cholelithiasis without definitive evidence of acute cholecystitis. If there is continued suspicion for cholecystitis, further evaluation with HIDA scan would be beneficial. 2. Dilated common bile duct again seen which remains suspicious for stricture/mass. Electronically Signed   By: Acquanetta Belling M.D.   On: 05/08/2023 10:14   MR ABDOMEN MRCP WO CONTRAST Result Date: 05/08/2023 CLINICAL DATA:  Inpatient. Jaundice. Chronic kidney disease. Metastatic colon cancer. EXAM: MRI ABDOMEN WITHOUT CONTRAST  (INCLUDING MRCP) TECHNIQUE: Multiplanar multisequence MR imaging of the abdomen was performed. Heavily T2-weighted images of the biliary and pancreatic ducts were obtained, and three-dimensional MRCP images were rendered by post processing. COMPARISON:  05/07/2023 CT abdomen/pelvis. FINDINGS: Lower chest: Trace posterior right pleural effusion. Hepatobiliary: Normal liver size and configuration. No hepatic steatosis. There is a solitary 1.1 x 0.9 cm anterior segment 2 left liver mass (series 7/image 10 and best seen on diffusion sequence series 5/image 10), worrisome for liver metastasis. No additional liver lesions. Mild gallbladder distention. Moderate diffuse gallbladder wall thickening. Minimal pericholecystic fluid and fat stranding. Small amount of layering sludge in the gallbladder. No gallstones. Moderate diffuse intrahepatic biliary ductal dilatation. Dilated proximal common bile duct with diameter 13 mm. Abrupt malignant-appearing biliary stricture at the level of the  middle third of the common bile duct with diminutive caliber  of the lower third of the common bile duct (1 mm diameter). No choledocholithiasis. There is a mildly enlarged peripancreatic/porta hepatis 1.1 cm pathologic lymph node abutting the site of the common bile duct stricture (series 3/image 16). Pancreas: No pancreatic mass or duct dilation. No peripancreatic fluid collections. Unable to confidently assess for pancreas divisum given motion degradation on the MRCP sequences. Spleen: Normal size. No mass. Adrenals/Urinary Tract: Normal adrenals. No hydronephrosis. Small simple bilateral renal cysts, largest 3.5 cm in the interpolar right kidney, for which no follow-up imaging is recommended. Stomach/Bowel: Normal non-distended stomach. Visualized small and large bowel is normal caliber, with no bowel wall thickening. Partially visualized postsurgical changes from partial left colectomy. Vascular/Lymphatic: Normal caliber abdominal aorta. Multiple mildly enlarged porta hepatis nodes up to 1.9 cm (series 5/image 16). Mildly enlarged 1.0 cm portacaval node (series 5/image 22). Other: No abdominal ascites or focal fluid collection. Musculoskeletal: No aggressive appearing focal osseous lesions. IMPRESSION: 1. Abrupt malignant-appearing biliary stricture at the level of the middle third of the common bile duct, with adjacent porta hepatis adenopathy. Moderate diffuse intrahepatic and proximal extrahepatic biliary ductal dilatation. No choledocholithiasis. 2. Mild gallbladder distention with moderate diffuse gallbladder wall thickening. Minimal pericholecystic fluid and fat stranding. Small amount of layering sludge in the gallbladder. No gallstones. Findings are nonspecific, with differential including acute cholecystitis versus reactive changes to biliary obstruction. 3. Solitary 1.1 cm anterior segment 2 left liver mass, worrisome for liver metastasis. 4. Multiple mildly enlarged porta hepatis and portacaval nodes,  suspicious for metastatic adenopathy. 5. Trace posterior right pleural effusion. Electronically Signed   By: Delbert Phenix M.D.   On: 05/08/2023 09:03   MR 3D Recon At Scanner Result Date: 05/08/2023 CLINICAL DATA:  Inpatient. Jaundice. Chronic kidney disease. Metastatic colon cancer. EXAM: MRI ABDOMEN WITHOUT CONTRAST  (INCLUDING MRCP) TECHNIQUE: Multiplanar multisequence MR imaging of the abdomen was performed. Heavily T2-weighted images of the biliary and pancreatic ducts were obtained, and three-dimensional MRCP images were rendered by post processing. COMPARISON:  05/07/2023 CT abdomen/pelvis. FINDINGS: Lower chest: Trace posterior right pleural effusion. Hepatobiliary: Normal liver size and configuration. No hepatic steatosis. There is a solitary 1.1 x 0.9 cm anterior segment 2 left liver mass (series 7/image 10 and best seen on diffusion sequence series 5/image 10), worrisome for liver metastasis. No additional liver lesions. Mild gallbladder distention. Moderate diffuse gallbladder wall thickening. Minimal pericholecystic fluid and fat stranding. Small amount of layering sludge in the gallbladder. No gallstones. Moderate diffuse intrahepatic biliary ductal dilatation. Dilated proximal common bile duct with diameter 13 mm. Abrupt malignant-appearing biliary stricture at the level of the middle third of the common bile duct with diminutive caliber of the lower third of the common bile duct (1 mm diameter). No choledocholithiasis. There is a mildly enlarged peripancreatic/porta hepatis 1.1 cm pathologic lymph node abutting the site of the common bile duct stricture (series 3/image 16). Pancreas: No pancreatic mass or duct dilation. No peripancreatic fluid collections. Unable to confidently assess for pancreas divisum given motion degradation on the MRCP sequences. Spleen: Normal size. No mass. Adrenals/Urinary Tract: Normal adrenals. No hydronephrosis. Small simple bilateral renal cysts, largest 3.5 cm in  the interpolar right kidney, for which no follow-up imaging is recommended. Stomach/Bowel: Normal non-distended stomach. Visualized small and large bowel is normal caliber, with no bowel wall thickening. Partially visualized postsurgical changes from partial left colectomy. Vascular/Lymphatic: Normal caliber abdominal aorta. Multiple mildly enlarged porta hepatis nodes up to 1.9 cm (series 5/image 16). Mildly enlarged 1.0 cm portacaval node (  series 5/image 22). Other: No abdominal ascites or focal fluid collection. Musculoskeletal: No aggressive appearing focal osseous lesions. IMPRESSION: 1. Abrupt malignant-appearing biliary stricture at the level of the middle third of the common bile duct, with adjacent porta hepatis adenopathy. Moderate diffuse intrahepatic and proximal extrahepatic biliary ductal dilatation. No choledocholithiasis. 2. Mild gallbladder distention with moderate diffuse gallbladder wall thickening. Minimal pericholecystic fluid and fat stranding. Small amount of layering sludge in the gallbladder. No gallstones. Findings are nonspecific, with differential including acute cholecystitis versus reactive changes to biliary obstruction. 3. Solitary 1.1 cm anterior segment 2 left liver mass, worrisome for liver metastasis. 4. Multiple mildly enlarged porta hepatis and portacaval nodes, suspicious for metastatic adenopathy. 5. Trace posterior right pleural effusion. Electronically Signed   By: Delbert Phenix M.D.   On: 05/08/2023 09:03   CT ABDOMEN PELVIS WO CONTRAST Result Date: 05/07/2023 CLINICAL DATA:  History of colon cancer, metastatic. No which on this and worsening renal failure. * Tracking Code: BO * EXAM: CT ABDOMEN AND PELVIS WITHOUT CONTRAST TECHNIQUE: Multidetector CT imaging of the abdomen and pelvis was performed following the standard protocol without IV contrast. RADIATION DOSE REDUCTION: This exam was performed according to the departmental dose-optimization program which includes  automated exposure control, adjustment of the mA and/or kV according to patient size and/or use of iterative reconstruction technique. COMPARISON:  03/05/2023 FINDINGS: Lower chest: Dependent atelectasis in the lung bases. Hepatobiliary: Intrahepatic biliary duct dilatation is new in the interval. There may be some associated periportal edema. Portal venous anatomy is prominent and ill-defined. Density in the portal vein is similar to background blood pool density in the aorta. Common duct is dilated up to 15 mm diameter and appears to abruptly terminate prior to entering the head of the pancreas. Gallbladder is distended with ill definition of the gallbladder wall and potential pericholecystic edema/fluid. Pancreas: No focal mass lesion. No dilatation of the main duct. No intraparenchymal cyst. No peripancreatic edema. Spleen: No splenomegaly. No suspicious focal mass lesion. Adrenals/Urinary Tract: No adrenal nodule or mass. Stable right renal cyst. No followup imaging is recommended. Left kidney unremarkable on noncontrast imaging. No hydroureteronephrosis. Bladder is decompressed. Stomach/Bowel: Mild circumferential wall thickening noted distal esophagus. Stomach is unremarkable. No gastric wall thickening. No evidence of outlet obstruction. Duodenum is normally positioned as is the ligament of Treitz. No small bowel wall thickening. No small bowel dilatation. The terminal ileum is normal. The appendix is normal. Status post left hemicolectomy with reanastomosis. Vascular/Lymphatic: No abdominal aortic aneurysm. No abdominal aortic atherosclerotic calcification. Small lymph nodes in the gastrohepatic ligament (image 21/2) are similar to prior. Small lymph nodes also noted hepatoduodenal ligament. Index 14 mm short axis hepatoduodenal ligament node measured previously is 16 mm on 27/2 today. Index left external iliac lymph node measured previously at 14 mm short axis is 14 mm again today on 76/2. Other small  lymph nodes along the left pelvic sidewall again noted. Reproductive: The prostate gland and seminal vesicles are unremarkable. Other: No intraperitoneal free fluid. Musculoskeletal: Small left groin hernia contains only fat. No worrisome lytic or sclerotic osseous abnormality. IMPRESSION: 1. New intrahepatic and extrahepatic biliary duct dilatation with possible associated periportal edema. Common duct is dilated up to 15 mm diameter and appears to abruptly terminate prior to entering the head of the pancreas. Imaging features are compatible with biliary obstruction, potentially secondary to the hepatoduodenal ligament lymphadenopathy seen previously. Assessment is limited due to lack of intravenous contrast material. 2. Distended gallbladder with possible gallbladder wall thickening  and pericholecystic edema. Right upper quadrant ultrasound recommended to evaluate for acute cholecystitis. 3. No substantial change in lymphadenopathy of the gastrohepatic ligament, hepatoduodenal ligament, and left external iliac chain. 4. Tiny hypoattenuating foci seen previously in the dome of the left liver are not evident on the current exam. 5. Mild circumferential wall thickening distal esophagus. Esophagitis would be a consideration. 6. Small left groin hernia contains only fat. Electronically Signed   By: Kennith Center M.D.   On: 05/07/2023 14:04    ROS:  As stated above in the HPI otherwise negative.  Blood pressure (!) 169/90, pulse 62, temperature 98.2 F (36.8 C), temperature source Oral, resp. rate 17, height 6\' 1"  (1.854 m), weight 87 kg, SpO2 99%.    PE: Gen: NAD, Alert and Oriented Lungs: CTA Bilaterally CV: RRR without M/G/R ABD: Soft, NTND, +BS Ext: No C/C/E  Assessment/Plan: 1) Biliary obstruction. 2) Probable metastatic colon cancer. 3) Hematuria. 4) Anemia.   The patient is currently stable.  With the significant biliary obstruction it will prudent to start antibiotics to prevent a  cholangitis.  The patient does have pruritus and an ERCP with stenting will be pursued.  An EUS will also be performed to potentially sample the lymph nodes.  Plan: 1) EUS +/- FNA and ERCP with stent placement on Thursday. 2) Cipro to prophylax against cholangitis. 3) Transfuse as needed.  Nazir Hacker D 05/08/2023, 3:18 PM

## 2023-05-08 NOTE — Progress Notes (Signed)
PHARMACY NOTE:  ANTIMICROBIAL RENAL DOSAGE ADJUSTMENT  Current antimicrobial regimen includes a mismatch between antimicrobial dosage and estimated renal function.  As per policy approved by the Pharmacy & Therapeutics and Medical Executive Committees, the antimicrobial dosage will be adjusted accordingly.  Current antimicrobial dosage:  500 mg bid  Indication: prevent a cholangitis per GI  Renal Function:  Estimated Creatinine Clearance: 18.9 mL/min (A) (by C-G formula based on SCr of 4.75 mg/dL (H)).  Antimicrobial dosage has been changed to:  500 mg po qday  Additional comments:  Thank you for allowing pharmacy to be a part of this patient's care.  Herby Abraham, Pharm.D Use secure chat for questions 05/08/2023 3:32 PM

## 2023-05-08 NOTE — Progress Notes (Signed)
[facility] Progress Note  Patient: Jeffrey Young     YSA:[id number]  DOA: 05/07/2023   PCP: Tracey Harries, MD       Brief hospital course: This is a 57 year old man with stage III colon cancer who is currently on surveillance, diabetes mellitus, wound on left foot, chronic kidney disease, hypertension who presents to the hospital for bloody urine.  There was no gross hematuria and a UA was negative for blood.  He was noted to be jaundiced.  Blood work revealed elevated LFTs.  Creatinine was also noted to be elevated from a baseline of 3.53 to a level of 5.02. Imaging suggested possible cholecystitis.  He was made n.p.o. and started on cefepime.  Subjective:  The patient has no abdominal pain nausea or vomiting.  He has had no trouble with eating at home and no decreased appetite.  He is not had any bowel changes.  He and his wife continue to insist that his urine is bloody today.  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 -UA negative - Suspect his urine is likely orange in color due to jaundice/hyperbilirubinemia and not  actually bloody  Severe hypokalemia - Replacing - Magnesium checked today and normal at 1.9 - Follow-up potassium tomorrow -According to home medication list, he was supposed to be taking a tablet of Klor-Con every day but he has not been  Hypertension - Continue carvedilol and resume amlodipine today  Diabetes mellitus, insulin requiring - Lantus on hold - will resume Lantus at a slightly lower than home dose - Continue sliding scale -Hemoglobin A1c is well-controlled at 5  Wound on left foot - Managed as outpatient by podiatry - Appreciate wound care consult-continue dressing changes as recommended      Code Status: Full Code Total time on patient care: 35 minutes DVT prophylaxis:  SCDs Start: 05/08/23 0225     Objective:   Vitals:   05/08/23 0512 05/08/23 0623 05/08/23 0657 05/08/23 0938  BP: (!) 171/97 (!) 163/89 (!) 175/89 (!) 161/88  Pulse: 71 70 72 62  Resp: 17  18 17   Temp: 98.3 F (36.8 C) 98.4 F (36.9 C) 97.9 F (36.6 C) 98.2 F (36.8 C)  TempSrc: Oral Oral Oral Oral  SpO2: 97% 98% 100% 99%  Weight:      Height:       Filed Weights   05/07/23 0933  Weight: 87 kg   Exam: General exam: Appears comfortable  HEENT: oral mucosa moist, scleral icterus noted Respiratory system: Clear to auscultation.  Cardiovascular system: S1 & S2 heard  Gastrointestinal system: Abdomen soft, non-tender, nondistended. Normal bowel sounds   Psychiatry: Flat affect     CBC: Recent Labs  Lab 05/07/23 1023 05/07/23 2238 05/08/23 0410  WBC 8.9 8.1 7.7  NEUTROABS 6.6  --   --   HGB 7.7* 7.4* 6.9*  HCT 22.4* 22.1* 20.5*  MCV 87.8 90.9 90.7  PLT 398 346 344   Basic Metabolic Panel: Recent Labs  Lab 05/07/23 1023 05/08/23 0410  NA 130* 133*  K 3.0* 2.9*  CL 94* 101  CO2 24 22  GLUCOSE 177* 182*  BUN 43* 42*  CREATININE 5.02* 4.75*  CALCIUM 8.7* 8.1*  MG  --  1.9     Scheduled Meds:  carvedilol  12.5 mg Oral BID WC   Chlorhexidine Gluconate Cloth  6  each Topical Daily   insulin aspart  0-6 Units Subcutaneous TID WC   potassium chloride  40 mEq Oral Q4H   sodium chloride flush  10-40 mL Intracatheter Q12H    Imaging and lab data personally reviewed   Author: Calvert Cantor  05/08/2023 12:22 PM  To contact [facility]>   Check the care team in [facility] and look for the attending/consulting [facility] provider listed  Log into [email or web address] and use [place]'s universal password   Go to> "[facility]"  and find provider  If you still have difficulty reaching the provider, please page the [facility] (Director on Call) for the Hospitalists listed on amion

## 2023-05-08 NOTE — H&P (View-Only) (Signed)
Reason for Consult:Obstructive Jaundice Referring Physician: [facility]  Rosebud Poles HPI: This is a 57 year old male with a PMH of Stage III descending colon cancer, DM with complications, HTN, hyperlipidemia, and CHF admitted for obstructive jaundice.  He initially presented to the ER with complaints of hematuria and feeling poorly.  This past weekend he reported that his skin was "on fire" and there is a pruritic component to his skin sensation.  Upon admission he was noted to have an HGB of 7.7 g/dL and a creatinine at 5.  His liver enzymes were markedly elevated:  AST 99, ALT 91, AP 1070, and TB 13.2.  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  A 1.1 cm segment 2 liver lesion was also identified.  The intrahepatic and extrahepatic ducts were dilated.  The patient denies any fever and he does not report any abdominal pain.  His WBC on admission was normal and it remains normal.  The patient did successfully complete treatment for his colon cancer.  Past Medical History:  Diagnosis Date   Arthritis    Hip   Colon cancer (HCC)    Diabetic mononeuropathy associated with type 2 diabetes mellitus (HCC) 03/21/2017   Diabetic retinopathy (HCC) 07/31/2020   Enthesopathy of ankle and tarsus 04/02/2009   Formatting of this note might be different from the original. 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 Type 2 Diabetes Mellitus - Uncomplicated, Uncontrolled    Past Surgical History:  Procedure Laterality Date   AMPUTATION Left 01/22/2018   Procedure: TRANSMETATARSAL AMPUTATION;  Surgeon: Nadara Mustard, MD;  Location: [facility] OR;  Service: Orthopedics;  Laterality: Left;toes   BIOPSY  07/27/2020   Procedure: BIOPSY;  Surgeon: Jeani Hawking, MD;  Location: [facility];  Service: Endoscopy;;   COLON RESECTION N/A 07/30/2020   Procedure: HAND ASSISTED LAPAROSCOPIC LEFT HEMI COLECTOMY;  Surgeon: Luretha Murphy, MD;  Location: [facility] ORS;  Service: General;  Laterality: N/A;   COLONOSCOPY WITH PROPOFOL N/A 05/24/2018   Procedure: COLONOSCOPY WITH PROPOFOL;  Surgeon: Jeani Hawking, MD;  Location: [facility];  Service: Endoscopy;  Laterality: N/A;   COLONOSCOPY WITH PROPOFOL N/A 07/27/2020   Procedure: COLONOSCOPY WITH PROPOFOL;  Surgeon: Jeani Hawking, MD;  Location: [facility];  Service: Endoscopy;  Laterality: N/A;   ESOPHAGOGASTRODUODENOSCOPY Left 04/05/2021   Procedure: ESOPHAGOGASTRODUODENOSCOPY (EGD);  Surgeon: Jeani Hawking, MD;  Location: [facility];  Service: Endoscopy;  Laterality: Left;   POLYPECTOMY  05/24/2018   Procedure: POLYPECTOMY;  Surgeon: Jeani Hawking, MD;  Location: [facility];  Service: Endoscopy;;   PORTACATH PLACEMENT Left 08/24/2020   Procedure: INSERTION PORT-A-CATH;  Surgeon: Luretha Murphy, MD;  Location: [facility];  Service: General;  Laterality: Left;  75/rm1   SUBMUCOSAL TATTOO INJECTION  07/27/2020   Procedure: SUBMUCOSAL TATTOO INJECTION;  Surgeon: Jeani Hawking, MD;  Location: [facility];  Service: Endoscopy;;    Family History  Problem Relation Age of Onset   Hypertension Father     Social History:  reports that he has never smoked. He has never used smokeless tobacco. He reports current alcohol use of about 1.0 standard drink of alcohol  per week. He reports that he does not use drugs.  Allergies:  Allergies  Allergen Reactions   Bee Venom Anaphylaxis, Swelling and Other (See Comments)    Cold Sweats, also   Latex Rash    Medications: Scheduled:  amLODipine  5 mg Oral Daily   carvedilol  12.5 mg Oral BID WC   Chlorhexidine Gluconate Cloth  6 each Topical Daily   insulin aspart  0-6 Units Subcutaneous TID WC   insulin glargine-yfgn  10 Units Subcutaneous BID   potassium chloride  40 mEq Oral Once   potassium chloride  40 mEq Oral Q4H   sodium chloride flush  10-40 mL Intracatheter Q12H   Continuous:  sodium chloride 100 mL/hr at 05/07/23 2213   sodium chloride      Results for orders placed or performed during the hospital encounter of 05/07/23 (from the past 24 hours)  CBC     Status: Abnormal   Collection Time: 05/07/23 10:38 PM  Result Value Ref Range   WBC 8.1 4.0 - 10.5 K/uL   RBC 2.43 (L) 4.22 - 5.81 MIL/uL   Hemoglobin 7.4 (L) 13.0 - 17.0 g/dL   HCT 40.9 (L) 81.1 - 91.4 %   MCV 90.9 80.0 - 100.0 fL   MCH 30.5 26.0 - 34.0 pg   MCHC 33.5 30.0 - 36.0 g/dL   RDW 78.2 (H) 95.6 - 21.3 %   Platelets 346 150 - 400 K/uL   nRBC 0.0 0.0 - 0.2 %  Type and screen  [facility]     Status: None (Preliminary result)   Collection Time: 05/07/23 10:38 PM  Result Value Ref Range   ABO/RH(D) A POS    Antibody Screen NEG    Sample Expiration 05/10/2023,2359    Unit Number [id number]    Blood Component Type RED CELLS,LR    Unit division 00    Status of Unit ISSUED    Transfusion Status OK TO TRANSFUSE    Crossmatch Result      Compatible Performed at [facility], [address] [place], [place] [address]   CBC     Status: Abnormal   Collection Time: 05/08/23  4:10 AM  Result Value Ref Range   WBC 7.7 4.0 - 10.5 K/uL   RBC 2.26 (L) 4.22 - 5.81 MIL/uL   Hemoglobin 6.9 (LL) 13.0 - 17.0 g/dL   HCT 24.4 (L) 01.0 - 27.2 %   MCV 90.7 80.0 - 100.0 fL   MCH 30.5 26.0 -  34.0 pg   MCHC 33.7 30.0 - 36.0 g/dL   RDW 53.6 (H) 64.4 - 03.4 %   Platelets 344 150 - 400 K/uL   nRBC 0.0 0.0 - 0.2 %  Comprehensive metabolic panel     Status: Abnormal   Collection Time: 05/08/23  4:10 AM  Result Value Ref Range   Sodium 133 (L) 135 - 145 mmol/L   Potassium 2.9 (L) 3.5 - 5.1 mmol/L   Chloride 101 98 - 111 mmol/L   CO2 22 22 - 32 mmol/L   Glucose, Bld 182 (H) 70 - 99 mg/dL   BUN 42 (H) 6 - 20 mg/dL   Creatinine, Ser 7.42 (H) 0.61 -  1.24 mg/dL   Calcium 8.1 (L) 8.9 - 10.3 mg/dL   Total Protein 5.9 (L) 6.5 - 8.1 g/dL   Albumin 1.9 (L) 3.5 - 5.0 g/dL   AST 95 (H) 15 - 41 U/L   ALT 83 (H) 0 - 44 U/L   Alkaline Phosphatase 1,008 (H) 38 - 126 U/L   Total Bilirubin 11.8 (H) <1.2 mg/dL   GFR, Estimated 13 (L) >60 mL/min   Anion gap 10 5 - 15  Hemoglobin A1c     Status: None   Collection Time: 05/08/23  4:10 AM  Result Value Ref Range   Hgb A1c MFr Bld 5.4 4.8 - 5.6 %   Mean Plasma Glucose 108.28 mg/dL  HIV Antibody (routine testing w rflx)     Status: None   Collection Time: 05/08/23  4:10 AM  Result Value Ref Range   HIV Screen 4th Generation wRfx Non Reactive Non Reactive  Magnesium     Status: None   Collection Time: 05/08/23  4:10 AM  Result Value Ref Range   Magnesium 1.9 1.7 - 2.4 mg/dL  Prepare RBC (crossmatch)     Status: None   Collection Time: 05/08/23  6:12 AM  Result Value Ref Range   Order Confirmation      ORDER PROCESSED BY BLOOD BANK Performed at [facility], [address] [place], [place] [address]   Glucose, capillary     Status: Abnormal   Collection Time: 05/08/23  7:44 AM  Result Value Ref Range   Glucose-Capillary 163 (H) 70 - 99 mg/dL  Glucose, capillary     Status: Abnormal   Collection Time: 05/08/23 12:35 PM  Result Value Ref Range   Glucose-Capillary 143 (H) 70 - 99 mg/dL     US Abdomen Limited RUQ (LIVER/GB) Result Date: 05/08/2023 CLINICAL DATA:  Painless jaundice Cholecystitis EXAM: ULTRASOUND  ABDOMEN LIMITED RIGHT UPPER QUADRANT COMPARISON:  MRI abdomen nodule 06/07/2022 FINDINGS: Gallbladder: Gallstones: Present Sludge: Present Gallbladder Wall: Within normal limits Pericholecystic fluid: Minimal Sonographic Murphy's Sign: Negative per technologist Common bile duct: Diameter: 13 mm Liver: Parenchymal echogenicity: Within normal limits Contours: Normal Lesions: None Portal vein: Patent.  Hepatopetal flow Other: None. IMPRESSION: 1. Cholelithiasis without definitive evidence of acute cholecystitis. If there is continued suspicion for cholecystitis, further evaluation with HIDA scan would be beneficial. 2. Dilated common bile duct again seen which remains suspicious for stricture/mass. Electronically Signed   By: Acquanetta Belling M.D.   On: 05/08/2023 10:14   MR ABDOMEN MRCP WO CONTRAST Result Date: 05/08/2023 CLINICAL DATA:  Inpatient. Jaundice. Chronic kidney disease. Metastatic colon cancer. EXAM: MRI ABDOMEN WITHOUT CONTRAST  (INCLUDING MRCP) TECHNIQUE: Multiplanar multisequence MR imaging of the abdomen was performed. Heavily T2-weighted images of the biliary and pancreatic ducts were obtained, and three-dimensional MRCP images were rendered by post processing. COMPARISON:  05/07/2023 CT abdomen/pelvis. FINDINGS: Lower chest: Trace posterior right pleural effusion. Hepatobiliary: Normal liver size and configuration. No hepatic steatosis. There is a solitary 1.1 x 0.9 cm anterior segment 2 left liver mass (series 7/image 10 and best seen on diffusion sequence series 5/image 10), worrisome for liver metastasis. No additional liver lesions. Mild gallbladder distention. Moderate diffuse gallbladder wall thickening. Minimal pericholecystic fluid and fat stranding. Small amount of layering sludge in the gallbladder. No gallstones. Moderate diffuse intrahepatic biliary ductal dilatation. Dilated proximal common bile duct with diameter 13 mm. Abrupt malignant-appearing biliary stricture at the level of the  middle third of the common bile duct with diminutive caliber  of the lower third of the common bile duct (1 mm diameter). No choledocholithiasis. There is a mildly enlarged peripancreatic/porta hepatis 1.1 cm pathologic lymph node abutting the site of the common bile duct stricture (series 3/image 16). Pancreas: No pancreatic mass or duct dilation. No peripancreatic fluid collections. Unable to confidently assess for pancreas divisum given motion degradation on the MRCP sequences. Spleen: Normal size. No mass. Adrenals/Urinary Tract: Normal adrenals. No hydronephrosis. Small simple bilateral renal cysts, largest 3.5 cm in the interpolar right kidney, for which no follow-up imaging is recommended. Stomach/Bowel: Normal non-distended stomach. Visualized small and large bowel is normal caliber, with no bowel wall thickening. Partially visualized postsurgical changes from partial left colectomy. Vascular/Lymphatic: Normal caliber abdominal aorta. Multiple mildly enlarged porta hepatis nodes up to 1.9 cm (series 5/image 16). Mildly enlarged 1.0 cm portacaval node (series 5/image 22). Other: No abdominal ascites or focal fluid collection. Musculoskeletal: No aggressive appearing focal osseous lesions. IMPRESSION: 1. Abrupt malignant-appearing biliary stricture at the level of the middle third of the common bile duct, with adjacent porta hepatis adenopathy. Moderate diffuse intrahepatic and proximal extrahepatic biliary ductal dilatation. No choledocholithiasis. 2. Mild gallbladder distention with moderate diffuse gallbladder wall thickening. Minimal pericholecystic fluid and fat stranding. Small amount of layering sludge in the gallbladder. No gallstones. Findings are nonspecific, with differential including acute cholecystitis versus reactive changes to biliary obstruction. 3. Solitary 1.1 cm anterior segment 2 left liver mass, worrisome for liver metastasis. 4. Multiple mildly enlarged porta hepatis and portacaval nodes,  suspicious for metastatic adenopathy. 5. Trace posterior right pleural effusion. Electronically Signed   By: Delbert Phenix M.D.   On: 05/08/2023 09:03   MR 3D Recon At Scanner Result Date: 05/08/2023 CLINICAL DATA:  Inpatient. Jaundice. Chronic kidney disease. Metastatic colon cancer. EXAM: MRI ABDOMEN WITHOUT CONTRAST  (INCLUDING MRCP) TECHNIQUE: Multiplanar multisequence MR imaging of the abdomen was performed. Heavily T2-weighted images of the biliary and pancreatic ducts were obtained, and three-dimensional MRCP images were rendered by post processing. COMPARISON:  05/07/2023 CT abdomen/pelvis. FINDINGS: Lower chest: Trace posterior right pleural effusion. Hepatobiliary: Normal liver size and configuration. No hepatic steatosis. There is a solitary 1.1 x 0.9 cm anterior segment 2 left liver mass (series 7/image 10 and best seen on diffusion sequence series 5/image 10), worrisome for liver metastasis. No additional liver lesions. Mild gallbladder distention. Moderate diffuse gallbladder wall thickening. Minimal pericholecystic fluid and fat stranding. Small amount of layering sludge in the gallbladder. No gallstones. Moderate diffuse intrahepatic biliary ductal dilatation. Dilated proximal common bile duct with diameter 13 mm. Abrupt malignant-appearing biliary stricture at the level of the middle third of the common bile duct with diminutive caliber of the lower third of the common bile duct (1 mm diameter). No choledocholithiasis. There is a mildly enlarged peripancreatic/porta hepatis 1.1 cm pathologic lymph node abutting the site of the common bile duct stricture (series 3/image 16). Pancreas: No pancreatic mass or duct dilation. No peripancreatic fluid collections. Unable to confidently assess for pancreas divisum given motion degradation on the MRCP sequences. Spleen: Normal size. No mass. Adrenals/Urinary Tract: Normal adrenals. No hydronephrosis. Small simple bilateral renal cysts, largest 3.5 cm in  the interpolar right kidney, for which no follow-up imaging is recommended. Stomach/Bowel: Normal non-distended stomach. Visualized small and large bowel is normal caliber, with no bowel wall thickening. Partially visualized postsurgical changes from partial left colectomy. Vascular/Lymphatic: Normal caliber abdominal aorta. Multiple mildly enlarged porta hepatis nodes up to 1.9 cm (series 5/image 16). Mildly enlarged 1.0 cm portacaval node (  series 5/image 22). Other: No abdominal ascites or focal fluid collection. Musculoskeletal: No aggressive appearing focal osseous lesions. IMPRESSION: 1. Abrupt malignant-appearing biliary stricture at the level of the middle third of the common bile duct, with adjacent porta hepatis adenopathy. Moderate diffuse intrahepatic and proximal extrahepatic biliary ductal dilatation. No choledocholithiasis. 2. Mild gallbladder distention with moderate diffuse gallbladder wall thickening. Minimal pericholecystic fluid and fat stranding. Small amount of layering sludge in the gallbladder. No gallstones. Findings are nonspecific, with differential including acute cholecystitis versus reactive changes to biliary obstruction. 3. Solitary 1.1 cm anterior segment 2 left liver mass, worrisome for liver metastasis. 4. Multiple mildly enlarged porta hepatis and portacaval nodes, suspicious for metastatic adenopathy. 5. Trace posterior right pleural effusion. Electronically Signed   By: Delbert Phenix M.D.   On: 05/08/2023 09:03   CT ABDOMEN PELVIS WO CONTRAST Result Date: 05/07/2023 CLINICAL DATA:  History of colon cancer, metastatic. No which on this and worsening renal failure. * Tracking Code: BO * EXAM: CT ABDOMEN AND PELVIS WITHOUT CONTRAST TECHNIQUE: Multidetector CT imaging of the abdomen and pelvis was performed following the standard protocol without IV contrast. RADIATION DOSE REDUCTION: This exam was performed according to the departmental dose-optimization program which includes  automated exposure control, adjustment of the mA and/or kV according to patient size and/or use of iterative reconstruction technique. COMPARISON:  03/05/2023 FINDINGS: Lower chest: Dependent atelectasis in the lung bases. Hepatobiliary: Intrahepatic biliary duct dilatation is new in the interval. There may be some associated periportal edema. Portal venous anatomy is prominent and ill-defined. Density in the portal vein is similar to background blood pool density in the aorta. Common duct is dilated up to 15 mm diameter and appears to abruptly terminate prior to entering the head of the pancreas. Gallbladder is distended with ill definition of the gallbladder wall and potential pericholecystic edema/fluid. Pancreas: No focal mass lesion. No dilatation of the main duct. No intraparenchymal cyst. No peripancreatic edema. Spleen: No splenomegaly. No suspicious focal mass lesion. Adrenals/Urinary Tract: No adrenal nodule or mass. Stable right renal cyst. No followup imaging is recommended. Left kidney unremarkable on noncontrast imaging. No hydroureteronephrosis. Bladder is decompressed. Stomach/Bowel: Mild circumferential wall thickening noted distal esophagus. Stomach is unremarkable. No gastric wall thickening. No evidence of outlet obstruction. Duodenum is normally positioned as is the ligament of Treitz. No small bowel wall thickening. No small bowel dilatation. The terminal ileum is normal. The appendix is normal. Status post left hemicolectomy with reanastomosis. Vascular/Lymphatic: No abdominal aortic aneurysm. No abdominal aortic atherosclerotic calcification. Small lymph nodes in the gastrohepatic ligament (image 21/2) are similar to prior. Small lymph nodes also noted hepatoduodenal ligament. Index 14 mm short axis hepatoduodenal ligament node measured previously is 16 mm on 27/2 today. Index left external iliac lymph node measured previously at 14 mm short axis is 14 mm again today on 76/2. Other small  lymph nodes along the left pelvic sidewall again noted. Reproductive: The prostate gland and seminal vesicles are unremarkable. Other: No intraperitoneal free fluid. Musculoskeletal: Small left groin hernia contains only fat. No worrisome lytic or sclerotic osseous abnormality. IMPRESSION: 1. New intrahepatic and extrahepatic biliary duct dilatation with possible associated periportal edema. Common duct is dilated up to 15 mm diameter and appears to abruptly terminate prior to entering the head of the pancreas. Imaging features are compatible with biliary obstruction, potentially secondary to the hepatoduodenal ligament lymphadenopathy seen previously. Assessment is limited due to lack of intravenous contrast material. 2. Distended gallbladder with possible gallbladder wall thickening  and pericholecystic edema. Right upper quadrant ultrasound recommended to evaluate for acute cholecystitis. 3. No substantial change in lymphadenopathy of the gastrohepatic ligament, hepatoduodenal ligament, and left external iliac chain. 4. Tiny hypoattenuating foci seen previously in the dome of the left liver are not evident on the current exam. 5. Mild circumferential wall thickening distal esophagus. Esophagitis would be a consideration. 6. Small left groin hernia contains only fat. Electronically Signed   By: Kennith Center M.D.   On: 05/07/2023 14:04    ROS:  As stated above in the HPI otherwise negative.  Blood pressure (!) 169/90, pulse 62, temperature 98.2 F (36.8 C), temperature source Oral, resp. rate 17, height 6\' 1"  (1.854 m), weight 87 kg, SpO2 99%.    PE: Gen: NAD, Alert and Oriented Lungs: CTA Bilaterally CV: RRR without M/G/R ABD: Soft, NTND, +BS Ext: No C/C/E  Assessment/Plan: 1) Biliary obstruction. 2) Probable metastatic colon cancer. 3) Hematuria. 4) Anemia.   The patient is currently stable.  With the significant biliary obstruction it will prudent to start antibiotics to prevent a  cholangitis.  The patient does have pruritus and an ERCP with stenting will be pursued.  An EUS will also be performed to potentially sample the lymph nodes.  Plan: 1) EUS +/- FNA and ERCP with stent placement on Thursday. 2) Cipro to prophylax against cholangitis. 3) Transfuse as needed.  Nazir Hacker D 05/08/2023, 3:18 PM

## 2023-05-08 NOTE — Consult Note (Addendum)
[facility]cer Center CONSULT NOTE  Patient Care Team: Tracey Harries, MD as PCP - General (Family Medicine) Chilton Si, MD as PCP - Cardiology (Cardiology) Nadara Mustard, MD as Consulting Physician (Orthopedic Surgery) Jeani Hawking, MD as Consulting Physician (Gastroenterology) Luretha Murphy, MD as Consulting Physician (General Surgery) Chilton Si, MD as Attending Physician (Cardiology) Maeola Harman, MD as Consulting Physician (Vascular Surgery) Ladene Artist, MD as Consulting Physician (Oncology) Radonna Ricker, RN (Inactive) as Oncology Nurse Navigator  CHIEF COMPLAINTS/PURPOSE OF CONSULTATION:  Colon Ca  REFERRING PHYSICIAN:  HISTORY OF PRESENTING ILLNESS:  Jeffrey Young 57 y.o. male who follows with medical oncology for stage III colon cancer.  Patient now admitted with complaints of hematuria that he noticed a couple days prior to admission.  Workup was done in the ED including labs which showed a total bili of 13 and a high creatinine of 5 with low hemoglobin 7.7. Patient seen today awake alert and oriented x 3 laying in bed.  Wife at bedside.  Reports hematuria is still ongoing and that he just got 1 unit of blood transfusion.  Denies chest pain, dizziness, nausea/vomiting.  Reports that he tries not to look at his stool when he has bowel movements however when he has looked at it it is "brown" and no blood or dark color noted.  Denies acute pain or any other acute complaints.  Patient reports that he recently had multiple teeth removed and was scheduled tomorrow 12/18 for dental implants, spouse states she has postponed that appointment.  Past medical history significant for colon cancer stage III which was diagnosed 08/11/2020.  Medical history includes hypertension, diabetes, history of anemia due to GI bleeding, neuropathy, chronic kidney disease with baseline creatinine in the 3s. Surgical history significant for left transmetatarsal  amputation September 2019 due to diabetic foot ulcer. Social history is not significant.  Patient is married, spouse at bedside. Family history is not significant. Oncology consultation and follow-up now requested.  I have reviewed his chart and materials related to his cancer extensively and collaborated history with the patient. 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   08/11/2020 Cancer Staging   Staging form: Colon and Rectum, AJCC 8th Edition - Clinical: Stage IIIC (cT3, cN2b, cM0) - Signed by Ladene Artist, MD on 08/11/2020 Histologic grade (G): G2 Histologic grading system: 4 grade system Laterality: Left Lymph-vascular invasion (LVI): LVI present/identified, NOS Tumor deposits (TD): Present Perineural invasion (PNI): Absent Microsatellite instability (MSI): Stable   08/30/2020 - 02/17/2021 Chemotherapy   Patient is on Treatment Plan : COLORECTAL FOLFOX q14d x 6 months       ASSESSMENT & PLAN:  Descending colon cancer, stage IIIc  -Patient is status post partial left colectomy 07/30/2020, 9-16 positive lymph nodes with lymphovascular invasion. -Status post FOLFOX x 12 cycles in 2022 -Patient was stable until 03/05/2023 when CT showed increase in size of left supraclavicular and retroperitoneal nodes. -Patient seen in office 03/26/2023, at that time the plan was to have a CEA in 12 weeks and restaging CT at 4 to 101-month interval. -Currently patient is not receiving active cancer treatment rather is being observed closely. -CT abdomen/pelvis 05/07/2023-new intra panic and extrahepatic biliary duct dilation with abrupt termination at the head of the pancreas, distended gallbladder, no change in gastropathic ligament, hepatoduodenal ligament, and left external iliac lymphadenopathy -MRI/MRCP  05/08/2023: Abrupt malignant  appearing biliary stricture at the level of the middle third of the common bile duct with adjacent porta hepatis adenopathy, gallbladder distention, solitary 1.1 cm segment 2 liver mass concerning for metastasis, multiple mildly enlarged porta hepatis and portacaval nodes -Patient has been followed closely by Dr. Truett Perna and further treatment planning will be decided with patient and his wife  2.  Anemia, microcytic -Likely multifactorial due to malignancy + renal insufficiency, and bleeding -Hemoglobin low 6.9; status post 1 unit PRBC this morning.  Repeat CBC with differential in AM -Transfuse PRBC for Hgb less than 7.0 -Monitor closely  3.  Renal insufficiency -Elevated creatinine 5.02--> decreased to 4.75 today -Patient's baseline usually in the 3 range -Continue hydration -Consider nephrology eval  4.  Hypertension -Monitor BP counts -Administer antihypertensives -Medicine following closely  5.  Diabetes -Monitor blood sugar levels -Insulin as needed -Follow-up with Endocrine/PCP  6.  Obstructive jaundice, likely secondary to lymphadenopathy     Orders Placed This Encounter  Procedures   Critical Care    This order was created via procedure documentation    Standing Status:   Standing    Number of Occurrences:   1   CT ABDOMEN PELVIS WO CONTRAST    Standing Status:   Standing    Number of Occurrences:   1    If indicated for the ordered procedure, I authorize the administration of oral contrast media per Radiology protocol:   Yes    Does the patient have a contrast media/X-ray dye allergy?:   No   MR ABDOMEN MRCP WO CONTRAST    Standing Status:   Standing    Number of Occurrences:   1    What is the patient's sedation requirement?:   No Sedation    Does the patient have a pacemaker or implanted devices?:   Yes    Manufacturer of pacemake or implanted device?:   port   US Abdomen Limited RUQ (LIVER/GB)    Standing Status:   Standing    Number of Occurrences:   1     Symptom/Reason for Exam:   Painless jaundice [[id number]]    Symptom/Reason for Exam:   Cholecystitis [[id number]]   MR 3D Recon At Scanner    Standing Status:   Standing    Number of Occurrences:   1    Symptom/Reason for Exam:   Jaundice [[id number]]    What is the patient's sedation requirement?:   No Sedation    Does the patient have a pacemaker or implanted devices?:   Yes    Manufacturer of pacemake or implanted device?:   port   CBC with Differential    Standing Status:   Standing    Number of Occurrences:   1   Comprehensive metabolic panel    Standing Status:   Standing    Number of Occurrences:   1   Urinalysis, Routine w reflex microscopic -Urine, Clean Catch    Standing Status:   Standing    Number of Occurrences:   1    Specimen Source:   Urine, Clean Catch [[id number]]   CBC    Standing Status:   Standing    Number of Occurrences:   1   CBC    Standing Status:   Standing    Number of Occurrences:   1   Comprehensive metabolic panel    Standing Status:   Standing    Number of Occurrences:   1   Hemoglobin A1c  To assess prior glycemic control    Standing Status:   Standing    Number of Occurrences:   1    Specimen collection method:   IV Team=IV Team collect   HIV Antibody (routine testing w rflx)    Standing Status:   Standing    Number of Occurrences:   1    Specimen collection method:   IV Team=IV Team collect   Glucose, capillary    Standing Status:   Standing    Number of Occurrences:   1   Magnesium    Standing Status:   Standing    Number of Occurrences:   1    Specimen collection method:   IV Team=IV Team collect   Diet NPO time specified    Standing Status:   Standing    Number of Occurrences:   1   Care order/instruction: Please use port.  No sticks    Please use port.  No sticks    Standing Status:   Standing    Number of Occurrences:   1   Refer to Sidebar Report - CHG Cloths Sidebar    - CHG Cloths Sidebar    Standing Status:   Standing    Number of  Occurrences:   1   Patient Education: - [facility] Daily CHG Bathing    Standing Status:   Standing    Number of Occurrences:   1    Specify:   - [facility] Daily CHG Bathing   Apply needleless connector (cap)    Standing Status:   Standing    Number of Occurrences:   20   May draw labs from central line    Standing Status:   Standing    Number of Occurrences:   20   For first catheter occlusion notify IV team ([facility], [facility]) or provider at all other sites.    Standing Status:   Standing    Number of Occurrences:   1   May access port-a-cath if present    Standing Status:   Standing    Number of Occurrences:   20   Strict intake and output    Standing Status:   Standing    Number of Occurrences:   1   Apply Diabetes Mellitus Care Plan    Standing Status:   Standing    Number of Occurrences:   1   STAT CBG when hypoglycemia is suspected. If treated, recheck every 15 minutes after each treatment until CBG >/= 70 mg/dl    Standing Status:   Standing    Number of Occurrences:   1   Refer to Hypoglycemia Protocol Sidebar Report for treatment of CBG < 70 mg/dl    Standing Status:   Standing    Number of Occurrences:   1   No HS correction Insulin    Standing Status:   Standing    Number of Occurrences:   1   Vital signs    Standing Status:   Standing    Number of Occurrences:   1   Notify physician (specify)    Standing Status:   Standing    Number of Occurrences:   20    Notify Physician:   for pulse less than 55 or greater than 120    Notify Physician:   for respiratory rate less than 12 or greater than 25    Notify Physician:   for temperature greater than 100.5 F    Notify Physician:   for urinary  output less than 30 mL/hr for four hours    Notify Physician:   for systolic BP less than 90 or greater than 160, diastolic BP less than 60 or greater than 100    Notify Physician:   for new hypoxia w/ oxygen saturations < 88%   Mobility Protocol: No Restrictions    RN to initiate protocols based on  patient's level of care    Standing Status:   Standing    Number of Occurrences:   1   Refer to Sidebar Report Refer to ICU, Med-Surg, Progressive, and Step-Down Mobility Protocol Sidebars    Refer to ICU, Med-Surg, Progressive, and Step-Down Mobility Protocol Sidebars    Standing Status:   Standing    Number of Occurrences:   1   Do not place and if present remove PureWick    Standing Status:   Standing    Number of Occurrences:   1   Initiate Oral Care Protocol    Standing Status:   Standing    Number of Occurrences:   1   Initiate Carrier Fluid Protocol    Standing Status:   Standing    Number of Occurrences:   1   RN may order General Admission PRN Orders utilizing "General Admission PRN medications" (through manage orders) for the following patient needs: allergy symptoms (Claritin), cold sores (Carmex), cough (Robitussin DM), eye irritation (Liquifilm Tears), hemorrhoids (Tucks), indigestion (Maalox), minor skin irritation (Hydrocortisone Cream), muscle pain (Ben Gay), nose irritation (saline nasal spray) and sore throat (Chloraseptic spray).    Standing Status:   Standing    Number of Occurrences:   [phone]   SCDs    Standing Status:   Standing    Number of Occurrences:   1    Laterality:   Bilateral   Patient has an active order for admit to inpatient/place in observation    Standing Status:   Standing    Number of Occurrences:   1   Informed Consent Details: Physician/Practitioner Attestation; Transcribe to consent form and obtain patient signature    Standing Status:   Standing    Number of Occurrences:   1    Physician/Practitioner attestation of informed consent for blood and or blood product transfusion:   I, the physician/practitioner, attest that I have discussed with the patient the benefits, risks, side effects, alternatives, likelihood of achieving goals and potential problems during recovery for the procedure that I have provided informed consent.    Product(s):   All  Product(s)   H & H post transfustion-  RN to place lab order with appropriate draw time    Standing Status:   Standing    Number of Occurrences:   1   Wound care    Cleanse L plantar foot ulcer with NS, apply Betadine ointment to callused skin surrounding ulcer, cut a small piece of silver hydrofiber to fit wound bed daily [facility] [phone]), cover with dry gauze and wrap in Kerlix roll gauze to secure    Standing Status:   Standing    Number of Occurrences:   1   Full code    Standing Status:   Standing    Number of Occurrences:   1    By::   Consent: discussion documented in EHR   Consult to hospitalist    Standing Status:   Standing    Number of Occurrences:   1    Place call to::   [facility]    Reason for Consult:  Admit   Consult to wound, ostomy, continence    Standing Status:   Standing    Number of Occurrences:   1    Reason for Consult?:   left foot diabetic ulcer   Oxygen therapy Mode or (Route): Nasal cannula; Liters Per Minute: 2; Keep O2 saturation between: greater than 92 %    Standing Status:   Standing    Number of Occurrences:   1    Mode or (Route):   Nasal cannula    Liters Per Minute:   2    Keep O2 saturation between:   greater than 92 %   EKG 12-Lead    Standing Status:   Standing    Number of Occurrences:   1   EKG    Standing Status:   Standing    Number of Occurrences:   1   Type and screen [facility]    [facility]     Standing Status:   Standing    Number of Occurrences:   1   Prepare RBC (crossmatch)    Standing Status:   Standing    Number of Occurrences:   1    # of Units:   1 unit    Transfusion Indications:   Hemoglobin < 7 gm/dL and symptomatic    Number of Units to Keep Ahead:   NO units ahead    Instructions::   Transfuse    If emergent release call blood bank:   Not emergent release   Routine line care    Standing Status:   Standing    Number of Occurrences:   18   Admit to Inpatient  (patient's expected length of stay will be greater than 2 midnights or inpatient only procedure)    Standing Status:   Standing    Number of Occurrences:   1    Hospital Area:   [facility] [[id number]]    Level of Care:   Progressive [102]    Admit to Progressive based on following criteria:   MULTISYSTEM THREATS such as stable sepsis, metabolic/electrolyte imbalance with or without encephalopathy that is responding to early treatment.    May admit patient to [facility] or [facility] if equivalent level of care is available::   No    Interfacility transfer:   Yes    Covid Evaluation:   Asymptomatic - no recent exposure (last 10 days) testing not required    Diagnosis:   Acquired hyperbilirubinemia [191478]    Admitting Physician:   Bobette Mo [[id number]]    Attending Physician:   Bobette Mo [[id number]]    Certification::   I certify this patient will need inpatient services for at least 2 midnights    Expected Medical Readiness:   05/09/2023     MEDICAL HISTORY:  Past Medical History:  Diagnosis Date   Arthritis    Hip   Colon cancer (HCC)    Diabetic mononeuropathy associated with type 2 diabetes mellitus (HCC) 03/21/2017   Diabetic retinopathy (HCC) 07/31/2020   Enthesopathy of ankle and tarsus 04/02/2009   Formatting of this note might be different from the original. 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 Type 2 Diabetes Mellitus - Uncomplicated, Uncontrolled    SURGICAL HISTORY: Past Surgical History:  Procedure Laterality Date   AMPUTATION Left 01/22/2018   Procedure: TRANSMETATARSAL AMPUTATION;  Surgeon: Nadara Mustard, MD;  Location: [facility] OR;  Service: Orthopedics;  Laterality: Left;toes   BIOPSY  07/27/2020   Procedure: BIOPSY;  Surgeon: Jeani Hawking, MD;  Location: [facility];  Service: Endoscopy;;   COLON RESECTION N/A 07/30/2020   Procedure: HAND ASSISTED LAPAROSCOPIC LEFT HEMI COLECTOMY;  Surgeon: Luretha Murphy, MD;  Location: [facility] ORS;  Service: General;  Laterality: N/A;   COLONOSCOPY WITH PROPOFOL N/A 05/24/2018   Procedure: COLONOSCOPY WITH PROPOFOL;  Surgeon: Jeani Hawking, MD;  Location: [facility];  Service: Endoscopy;  Laterality: N/A;   COLONOSCOPY WITH PROPOFOL N/A 07/27/2020   Procedure: COLONOSCOPY WITH PROPOFOL;  Surgeon: Jeani Hawking, MD;  Location: [facility];  Service: Endoscopy;  Laterality: N/A;   ESOPHAGOGASTRODUODENOSCOPY Left 04/05/2021   Procedure: ESOPHAGOGASTRODUODENOSCOPY (EGD);  Surgeon: Jeani Hawking, MD;  Location: [facility];  Service: Endoscopy;  Laterality: Left;   POLYPECTOMY  05/24/2018   Procedure: POLYPECTOMY;  Surgeon: Jeani Hawking, MD;  Location: [facility];  Service: Endoscopy;;   PORTACATH PLACEMENT Left 08/24/2020   Procedure: INSERTION PORT-A-CATH;  Surgeon: Luretha Murphy, MD;  Location: [facility];  Service: General;  Laterality: Left;  75/rm1   SUBMUCOSAL TATTOO INJECTION  07/27/2020   Procedure: SUBMUCOSAL TATTOO INJECTION;  Surgeon: Jeani Hawking, MD;  Location: [facility];  Service: Endoscopy;;    SOCIAL HISTORY: Social History   Socioeconomic History   Marital status: Married    Spouse name: Not on file   Number of children: Not on file   Years of education: Not on file   Highest education level: Not on file  Occupational  History   Not on file  Tobacco Use   Smoking status: Never   Smokeless tobacco: Never  Vaping Use   Vaping status: Never Used  Substance and Sexual Activity   Alcohol use: Yes    Alcohol/week: 1.0 standard drink of alcohol    Types: 1 Cans of beer per week    Comment: occasional   Drug use: Never   Sexual activity: Yes  Other Topics Concern   Not on file  Social History Narrative   Not on file   Social Drivers of Health   Financial Resource Strain: Low Risk  (08/23/2022)   Received from [facility], [facility]   Overall Financial Resource Strain (CARDIA)    Difficulty of Paying Living Expenses: Not hard at all  Food Insecurity: No Food Insecurity (05/07/2023)   Hunger Vital Sign    Worried About Running Out of Food in the Last Year: Never true    Ran Out of Food in the Last Year: Never true  Transportation Needs: No Transportation Needs (05/07/2023)   PRAPARE - Administrator, Civil Service (Medical): No    Lack of Transportation (Non-Medical): No  Physical Activity: Inactive (12/20/2020)   Received from [facility], [facility]   Exercise Vital Sign    Days of Exercise per Week: 0 days    Minutes of Exercise per Session: 0 min  Stress:  No Stress Concern Present (12/20/2020)   Received from [facility], [facility] - Occupational Stress Questionnaire    Feeling of Stress : Not at all  Social Connections: Unknown (09/30/2021)   Received from [facility], [facility]   Social Network    Social Network: Not on file  Intimate Partner Violence: Not At Risk (05/07/2023)   Humiliation, Afraid, Rape, and Kick questionnaire    Fear of Current or Ex-Partner: No    Emotionally Abused: No    Physically Abused: No    Sexually Abused: No    FAMILY HISTORY: Family History  Problem Relation Age of Onset   Hypertension Father     REVIEW OF SYSTEMS:   Constitutional: +fatigue, Denies fevers, chills or abnormal  night sweats Eyes: Denies blurriness of vision, double vision or watery eyes Ears, nose, mouth, throat, and face: Denies mucositis or sore throat Respiratory: Denies cough, dyspnea or wheezes Cardiovascular: Denies palpitation, chest discomfort or lower extremity swelling Gastrointestinal: Denies nausea, heartburn or change in bowel habits Skin: Denies abnormal skin rashes  Lymphatics: Denies new lymphadenopathy or easy bruising Neurological: Denies numbness, tingling or new weaknesses Behavioral/Psych: Mood is stable, no new changes  All other systems were reviewed with the patient and are negative.  PHYSICAL EXAMINATION: ECOG PERFORMANCE STATUS: 1 - Symptomatic but completely ambulatory  Vitals:   05/08/23 0657 05/08/23 0938  BP: (!) 175/89 (!) 161/88  Pulse: 72 62  Resp: 18 17  Temp: 97.9 F (36.6 C) 98.2 F (36.8 C)  SpO2: 100% 99%   Filed Weights   05/07/23 0933  Weight: 191 lb 12.8 oz (87 kg)    GENERAL: alert, no distress and comfortable SKIN: +slightly jaundiced skin color, texture, turgor are normal, no rashes or significant lesions EYES: normal, conjunctiva are pink and non-injected, +scleral icterus OROPHARYNX: no exudate, no erythema and lips, buccal mucosa, and tongue normal  NECK: supple, thyroid normal size, non-tender, without nodularity LYMPH: no palpable lymphadenopathy in the cervical, axillary or inguinal LUNGS: clear to auscultation and percussion with normal breathing effort HEART: regular rate & rhythm and no murmurs and no lower extremity edema ABDOMEN: abdomen soft, non-tender and normal bowel sounds MUSCULOSKELETAL: no cyanosis of digits and no clubbing  PSYCH: alert & oriented x 3 with fluent speech NEURO: no focal motor/sensory deficits   ALLERGIES:  is allergic to bee venom and latex.  MEDICATIONS:  Current Facility-Administered Medications  Medication Dose Route Frequency Provider Last Rate Last Admin   0.9 %  sodium chloride infusion    Intravenous Continuous Buena Irish, MD 100 mL/hr at 05/07/23 2213 New Bag at 05/07/23 2213   carvedilol (COREG) tablet 12.5 mg  12.5 mg Oral BID WC Buena Irish, MD   12.5 mg at 05/08/23 0759   ceFEPIme (MAXIPIME) 2 g in sodium chloride 0.9 % 100 mL IVPB  2 g Intravenous Q24H Buena Irish, MD 200 mL/hr at 05/08/23 0403 2 g at 05/08/23 0403   Chlorhexidine Gluconate Cloth 2 % PADS 6 each  6 each Topical Daily Bobette Mo, MD       insulin aspart (novoLOG) injection 0-6 Units  0-6 Units Subcutaneous TID [facility] Buena Irish, MD   1 Units at 05/08/23 0759   potassium chloride SA (KLOR-CON M) CR tablet 40 mEq  40 mEq Oral Q4H Rizwan, Ladell Heads, MD   40 mEq at 05/08/23 0759   sodium chloride flush (NS) 0.9 % injection 10-40 mL  10-40 mL Intracatheter Q12H  Bobette Mo, MD   10 mL at 05/08/23 7829   sodium chloride flush (NS) 0.9 % injection 10-40 mL  10-40 mL Intracatheter PRN Bobette Mo, MD       Facility-Administered Medications Ordered in Other Encounters  Medication Dose Route Frequency Provider Last Rate Last Admin   sodium chloride flush (NS) 0.9 % injection 10 mL  10 mL Intracatheter PRN Ladene Artist, MD   10 mL at 11/17/21 1153     LABORATORY DATA:  I have reviewed the data as listed Lab Results  Component Value Date   WBC 7.7 05/08/2023   HGB 6.9 (LL) 05/08/2023   HCT 20.5 (L) 05/08/2023   MCV 90.7 05/08/2023   PLT 344 05/08/2023   Recent Labs    07/07/22 0808 08/21/22 1113 11/06/22 0814 05/07/23 1023 05/08/23 0410  NA 129*   < > 135 130* 133*  K 3.8   < > 3.4* 3.0* 2.9*  CL 96*   < > 104 94* 101  CO2 25   < > 24 24 22   GLUCOSE 467*   < > 204* 177* 182*  BUN 30*   < > 33* 43* 42*  CREATININE 3.17*   < > 3.53* 5.02* 4.75*  CALCIUM 8.8*   < > 8.0* 8.7* 8.1*  GFRNONAA 22*   < > 19* 13* 13*  PROT 6.9  --   --  6.6 5.9*  ALBUMIN 3.1*  --   --  2.7* 1.9*  AST 10*  --   --  99* 95*  ALT 6  --   --  91* 83*  ALKPHOS 152*  --   --   1,070* 1,008*  BILITOT 0.3  --   --  13.2* 11.8*   < > = values in this interval not displayed.    RADIOGRAPHIC STUDIES: I have personally reviewed the radiological images as listed and agreed with the findings in the report. MR ABDOMEN MRCP WO CONTRAST Result Date: 05/08/2023 CLINICAL DATA:  Inpatient. Jaundice. Chronic kidney disease. Metastatic colon cancer. EXAM: MRI ABDOMEN WITHOUT CONTRAST  (INCLUDING MRCP) TECHNIQUE: Multiplanar multisequence MR imaging of the abdomen was performed. Heavily T2-weighted images of the biliary and pancreatic ducts were obtained, and three-dimensional MRCP images were rendered by post processing. COMPARISON:  05/07/2023 CT abdomen/pelvis. FINDINGS: Lower chest: Trace posterior right pleural effusion. Hepatobiliary: Normal liver size and configuration. No hepatic steatosis. There is a solitary 1.1 x 0.9 cm anterior segment 2 left liver mass (series 7/image 10 and best seen on diffusion sequence series 5/image 10), worrisome for liver metastasis. No additional liver lesions. Mild gallbladder distention. Moderate diffuse gallbladder wall thickening. Minimal pericholecystic fluid and fat stranding. Small amount of layering sludge in the gallbladder. No gallstones. Moderate diffuse intrahepatic biliary ductal dilatation. Dilated proximal common bile duct with diameter 13 mm. Abrupt malignant-appearing biliary stricture at the level of the middle third of the common bile duct with diminutive caliber of the lower third of the common bile duct (1 mm diameter). No choledocholithiasis. There is a mildly enlarged peripancreatic/porta hepatis 1.1 cm pathologic lymph node abutting the site of the common bile duct stricture (series 3/image 16). Pancreas: No pancreatic mass or duct dilation. No peripancreatic fluid collections. Unable to confidently assess for pancreas divisum given motion degradation on the MRCP sequences. Spleen: Normal size. No mass. Adrenals/Urinary Tract: Normal  adrenals. No hydronephrosis. Small simple bilateral renal cysts, largest 3.5 cm in the interpolar right kidney, for which no follow-up imaging is recommended.  Stomach/Bowel: Normal non-distended stomach. Visualized small and large bowel is normal caliber, with no bowel wall thickening. Partially visualized postsurgical changes from partial left colectomy. Vascular/Lymphatic: Normal caliber abdominal aorta. Multiple mildly enlarged porta hepatis nodes up to 1.9 cm (series 5/image 16). Mildly enlarged 1.0 cm portacaval node (series 5/image 22). Other: No abdominal ascites or focal fluid collection. Musculoskeletal: No aggressive appearing focal osseous lesions. IMPRESSION: 1. Abrupt malignant-appearing biliary stricture at the level of the middle third of the common bile duct, with adjacent porta hepatis adenopathy. Moderate diffuse intrahepatic and proximal extrahepatic biliary ductal dilatation. No choledocholithiasis. 2. Mild gallbladder distention with moderate diffuse gallbladder wall thickening. Minimal pericholecystic fluid and fat stranding. Small amount of layering sludge in the gallbladder. No gallstones. Findings are nonspecific, with differential including acute cholecystitis versus reactive changes to biliary obstruction. 3. Solitary 1.1 cm anterior segment 2 left liver mass, worrisome for liver metastasis. 4. Multiple mildly enlarged porta hepatis and portacaval nodes, suspicious for metastatic adenopathy. 5. Trace posterior right pleural effusion. Electronically Signed   By: Delbert Phenix M.D.   On: 05/08/2023 09:03   MR 3D Recon At Scanner Result Date: 05/08/2023 CLINICAL DATA:  Inpatient. Jaundice. Chronic kidney disease. Metastatic colon cancer. EXAM: MRI ABDOMEN WITHOUT CONTRAST  (INCLUDING MRCP) TECHNIQUE: Multiplanar multisequence MR imaging of the abdomen was performed. Heavily T2-weighted images of the biliary and pancreatic ducts were obtained, and three-dimensional MRCP images were rendered  by post processing. COMPARISON:  05/07/2023 CT abdomen/pelvis. FINDINGS: Lower chest: Trace posterior right pleural effusion. Hepatobiliary: Normal liver size and configuration. No hepatic steatosis. There is a solitary 1.1 x 0.9 cm anterior segment 2 left liver mass (series 7/image 10 and best seen on diffusion sequence series 5/image 10), worrisome for liver metastasis. No additional liver lesions. Mild gallbladder distention. Moderate diffuse gallbladder wall thickening. Minimal pericholecystic fluid and fat stranding. Small amount of layering sludge in the gallbladder. No gallstones. Moderate diffuse intrahepatic biliary ductal dilatation. Dilated proximal common bile duct with diameter 13 mm. Abrupt malignant-appearing biliary stricture at the level of the middle third of the common bile duct with diminutive caliber of the lower third of the common bile duct (1 mm diameter). No choledocholithiasis. There is a mildly enlarged peripancreatic/porta hepatis 1.1 cm pathologic lymph node abutting the site of the common bile duct stricture (series 3/image 16). Pancreas: No pancreatic mass or duct dilation. No peripancreatic fluid collections. Unable to confidently assess for pancreas divisum given motion degradation on the MRCP sequences. Spleen: Normal size. No mass. Adrenals/Urinary Tract: Normal adrenals. No hydronephrosis. Small simple bilateral renal cysts, largest 3.5 cm in the interpolar right kidney, for which no follow-up imaging is recommended. Stomach/Bowel: Normal non-distended stomach. Visualized small and large bowel is normal caliber, with no bowel wall thickening. Partially visualized postsurgical changes from partial left colectomy. Vascular/Lymphatic: Normal caliber abdominal aorta. Multiple mildly enlarged porta hepatis nodes up to 1.9 cm (series 5/image 16). Mildly enlarged 1.0 cm portacaval node (series 5/image 22). Other: No abdominal ascites or focal fluid collection. Musculoskeletal: No  aggressive appearing focal osseous lesions. IMPRESSION: 1. Abrupt malignant-appearing biliary stricture at the level of the middle third of the common bile duct, with adjacent porta hepatis adenopathy. Moderate diffuse intrahepatic and proximal extrahepatic biliary ductal dilatation. No choledocholithiasis. 2. Mild gallbladder distention with moderate diffuse gallbladder wall thickening. Minimal pericholecystic fluid and fat stranding. Small amount of layering sludge in the gallbladder. No gallstones. Findings are nonspecific, with differential including acute cholecystitis versus reactive changes to biliary obstruction. 3. Solitary  1.1 cm anterior segment 2 left liver mass, worrisome for liver metastasis. 4. Multiple mildly enlarged porta hepatis and portacaval nodes, suspicious for metastatic adenopathy. 5. Trace posterior right pleural effusion. Electronically Signed   By: Delbert Phenix M.D.   On: 05/08/2023 09:03   CT ABDOMEN PELVIS WO CONTRAST Result Date: 05/07/2023 CLINICAL DATA:  History of colon cancer, metastatic. No which on this and worsening renal failure. * Tracking Code: BO * EXAM: CT ABDOMEN AND PELVIS WITHOUT CONTRAST TECHNIQUE: Multidetector CT imaging of the abdomen and pelvis was performed following the standard protocol without IV contrast. RADIATION DOSE REDUCTION: This exam was performed according to the departmental dose-optimization program which includes automated exposure control, adjustment of the mA and/or kV according to patient size and/or use of iterative reconstruction technique. COMPARISON:  03/05/2023 FINDINGS: Lower chest: Dependent atelectasis in the lung bases. Hepatobiliary: Intrahepatic biliary duct dilatation is new in the interval. There may be some associated periportal edema. Portal venous anatomy is prominent and ill-defined. Density in the portal vein is similar to background blood pool density in the aorta. Common duct is dilated up to 15 mm diameter and appears to  abruptly terminate prior to entering the head of the pancreas. Gallbladder is distended with ill definition of the gallbladder wall and potential pericholecystic edema/fluid. Pancreas: No focal mass lesion. No dilatation of the main duct. No intraparenchymal cyst. No peripancreatic edema. Spleen: No splenomegaly. No suspicious focal mass lesion. Adrenals/Urinary Tract: No adrenal nodule or mass. Stable right renal cyst. No followup imaging is recommended. Left kidney unremarkable on noncontrast imaging. No hydroureteronephrosis. Bladder is decompressed. Stomach/Bowel: Mild circumferential wall thickening noted distal esophagus. Stomach is unremarkable. No gastric wall thickening. No evidence of outlet obstruction. Duodenum is normally positioned as is the ligament of Treitz. No small bowel wall thickening. No small bowel dilatation. The terminal ileum is normal. The appendix is normal. Status post left hemicolectomy with reanastomosis. Vascular/Lymphatic: No abdominal aortic aneurysm. No abdominal aortic atherosclerotic calcification. Small lymph nodes in the gastrohepatic ligament (image 21/2) are similar to prior. Small lymph nodes also noted hepatoduodenal ligament. Index 14 mm short axis hepatoduodenal ligament node measured previously is 16 mm on 27/2 today. Index left external iliac lymph node measured previously at 14 mm short axis is 14 mm again today on 76/2. Other small lymph nodes along the left pelvic sidewall again noted. Reproductive: The prostate gland and seminal vesicles are unremarkable. Other: No intraperitoneal free fluid. Musculoskeletal: Small left groin hernia contains only fat. No worrisome lytic or sclerotic osseous abnormality. IMPRESSION: 1. New intrahepatic and extrahepatic biliary duct dilatation with possible associated periportal edema. Common duct is dilated up to 15 mm diameter and appears to abruptly terminate prior to entering the head of the pancreas. Imaging features are  compatible with biliary obstruction, potentially secondary to the hepatoduodenal ligament lymphadenopathy seen previously. Assessment is limited due to lack of intravenous contrast material. 2. Distended gallbladder with possible gallbladder wall thickening and pericholecystic edema. Right upper quadrant ultrasound recommended to evaluate for acute cholecystitis. 3. No substantial change in lymphadenopathy of the gastrohepatic ligament, hepatoduodenal ligament, and left external iliac chain. 4. Tiny hypoattenuating foci seen previously in the dome of the left liver are not evident on the current exam. 5. Mild circumferential wall thickening distal esophagus. Esophagitis would be a consideration. 6. Small left groin hernia contains only fat. Electronically Signed   By: Kennith Center M.D.   On: 05/07/2023 14:04     The total time spent in the  appointment was 40 minutes encounter with patients including review of chart and various tests results, discussions about plan of care and coordination of care plan   All questions were answered. The patient knows to call the clinic with any problems, questions or concerns. No barriers to learning was detected.  Dawson Bills, NP 12/17/20249:51 AM   Jeffrey Young was interviewed and examined.  I reviewed the admission CT images.  He is admitted with gross hematuria and jaundice.  He appears to have biliary obstruction secondary to metastatic adenopathy.  There may be a new liver metastasis.  The etiology of the gross hematuria is unclear.  I discussed the current presentation with Jeffrey Young and his wife.  He understands the jaundice, and potentially the gross hematuria are likely related to metastatic colon cancer.  He will need decompression of the biliary system prior to deciding on systemic treatment options.  He has chronic/acute renal failure.  He has anemia secondary to renal failure and bleeding.  I will continue following Jeffrey Young in the hospital and  outpatient follow-up will be scheduled at the Cancer center.  Recommendations: Consult gastroenterology to consider an ERCP Urology consult if he is documented to have gross hematuria Management of renal failure per the medical service, nephrology consult? Oncology will continue following him the hospital, outpatient follow-up will be scheduled at the Cancer center

## 2023-05-08 NOTE — Plan of Care (Signed)

## 2023-05-08 NOTE — Plan of Care (Signed)
  Problem: Activity: Goal: Risk for activity intolerance will decrease Outcome: Progressing   Problem: Coping: Goal: Level of anxiety will decrease Outcome: Progressing   Problem: Pain Management: Goal: General experience of comfort will improve Outcome: Progressing   Problem: Safety: Goal: Ability to remain free from injury will improve Outcome: Progressing

## 2023-05-08 NOTE — Consult Note (Signed)
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 followed by podiatry at the [facility], last seen there 04/16/2023 with debridement and orders for Promogran which is a collagen dressing; discussed with patient and wife Promogran is not on formulary at [facility]; we will be using silver hydrofiber which they are agreeable to  Reason for Consult: L diabetic foot ulcer  Wound type: full thickness L plantar foot r/t neuropathy  Pressure Injury POA: NA  Measurement:2 cm x 2 cm x 0.2 cm  Wound bed:100% pink moist  Drainage (amount, consistency, odor) moderate tan mild odor  Periwound: heavily callusing with dark necrotic tissue  Dressing procedure/placement/frequency: Cleanse L plantar foot ulcer with NS, apply Betadine ointment to callused skin surrounding ulcer, cut a small piece of silver hydrofiber to fit wound bed daily [facility] [phone]), cover with dry gauze and wrap in Kerlix roll gauze to secure.   [facility]. Patient should resume previously prescribed dressings at discharge.    POC discussed with patient, wife and bedside nurse. WOC team will not follow. Re-consult if further needs arise.   Thank you,    Priscella Mann MSN, RN-BC, [employer] [phone]

## 2023-05-09 DIAGNOSIS — D638 Anemia in other chronic diseases classified elsewhere: Secondary | ICD-10-CM | POA: Diagnosis not present

## 2023-05-09 DIAGNOSIS — N189 Chronic kidney disease, unspecified: Secondary | ICD-10-CM

## 2023-05-09 DIAGNOSIS — K831 Obstruction of bile duct: Secondary | ICD-10-CM | POA: Diagnosis not present

## 2023-05-09 DIAGNOSIS — N179 Acute kidney failure, unspecified: Secondary | ICD-10-CM | POA: Diagnosis not present

## 2023-05-09 LAB — TYPE AND SCREEN
ABO/RH(D): A POS
Antibody Screen: NEGATIVE
Unit division: 0

## 2023-05-09 LAB — CBC
HCT: 21.7 % — ABNORMAL LOW (ref 39.0–52.0)
Hemoglobin: 7.7 g/dL — ABNORMAL LOW (ref 13.0–17.0)
MCH: 31.7 pg (ref 26.0–34.0)
MCHC: 35.5 g/dL (ref 30.0–36.0)
MCV: 89.3 fL (ref 80.0–100.0)
Platelets: 301 10*3/uL (ref 150–400)
RBC: 2.43 MIL/uL — ABNORMAL LOW (ref 4.22–5.81)
RDW: 17.4 % — ABNORMAL HIGH (ref 11.5–15.5)
WBC: 6.9 10*3/uL (ref 4.0–10.5)
nRBC: 0 % (ref 0.0–0.2)

## 2023-05-09 LAB — BASIC METABOLIC PANEL
Anion gap: 7 (ref 5–15)
BUN: 40 mg/dL — ABNORMAL HIGH (ref 6–20)
CO2: 19 mmol/L — ABNORMAL LOW (ref 22–32)
Calcium: 7.5 mg/dL — ABNORMAL LOW (ref 8.9–10.3)
Chloride: 104 mmol/L (ref 98–111)
Creatinine, Ser: 4.18 mg/dL — ABNORMAL HIGH (ref 0.61–1.24)
GFR, Estimated: 16 mL/min — ABNORMAL LOW (ref >60)
Glucose, Bld: 144 mg/dL — ABNORMAL HIGH (ref 70–99)
Potassium: 3.8 mmol/L (ref 3.5–5.1)
Sodium: 130 mmol/L — ABNORMAL LOW (ref 135–145)

## 2023-05-09 LAB — BPAM RBC
Blood Product Expiration Date: 202501142359
ISSUE DATE / TIME: 202412170636
Unit Type and Rh: 6200

## 2023-05-09 LAB — HEPATIC FUNCTION PANEL
ALT: 80 U/L — ABNORMAL HIGH (ref 0–44)
AST: 89 U/L — ABNORMAL HIGH (ref 15–41)
Albumin: 1.8 g/dL — ABNORMAL LOW (ref 3.5–5.0)
Alkaline Phosphatase: 865 U/L — ABNORMAL HIGH (ref 38–126)
Bilirubin, Direct: 7.7 mg/dL — ABNORMAL HIGH (ref 0.0–0.2)
Indirect Bilirubin: 4.1 mg/dL — ABNORMAL HIGH (ref 0.3–0.9)
Total Bilirubin: 11.8 mg/dL — ABNORMAL HIGH (ref <1.2)
Total Protein: 5.5 g/dL — ABNORMAL LOW (ref 6.5–8.1)

## 2023-05-09 LAB — GLUCOSE, CAPILLARY
Glucose-Capillary: 130 mg/dL — ABNORMAL HIGH (ref 70–99)
Glucose-Capillary: 141 mg/dL — ABNORMAL HIGH (ref 70–99)
Glucose-Capillary: 90 mg/dL (ref 70–99)
Glucose-Capillary: 183 mg/dL — ABNORMAL HIGH (ref 70–99)

## 2023-05-09 MED ORDER — PANTOPRAZOLE SODIUM 40 MG PO TBEC
40.0000 mg | DELAYED_RELEASE_TABLET | Freq: Every day | ORAL | Status: DC
  Administered 2023-05-09 – 2023-05-22 (×14): 40 mg via ORAL
  Filled 2023-05-09 (×14): qty 1

## 2023-05-09 NOTE — TOC CM/SW Note (Signed)
Transition of Care [facility]) - Inpatient Brief Assessment   Patient Details  Name: Jeffrey Young MRN: [id number] Date of Birth: 13-Jun-1965  Transition of [facility]) CM/SW Contact:    Larrie Kass, LCSW Phone Number: 05/09/2023, 11:21 AM   Clinical Narrative: Transition of Care Department [facility]) has reviewed patient and no TOC needs have been identified at this time. We will continue to monitor patient advancement through interdisciplinary progression rounds. If new patient transition needs arise, please place a TOC consult.   Transition of Care Asessment: Insurance and Status: Insurance coverage has been reviewed Patient has primary care physician: Yes Home environment has been reviewed: home with spouse Prior level of function:: independent Prior/Current Home Services: No current home services Social Drivers of Health Review: SDOH reviewed no interventions necessary Readmission risk has been reviewed: Yes Transition of care needs: no transition of care needs at this time

## 2023-05-09 NOTE — Plan of Care (Signed)
  Problem: Education: Goal: Knowledge of General Education information will improve Description: Including pain rating scale, medication(s)/side effects and non-pharmacologic comfort measures Outcome: Progressing   Problem: Health Behavior/Discharge Planning: Goal: Ability to manage health-related needs will improve Outcome: Progressing   Problem: Clinical Measurements: Goal: Ability to maintain clinical measurements within normal limits will improve Outcome: Progressing Goal: Diagnostic test results will improve Outcome: Progressing Goal: Respiratory complications will improve Outcome: Progressing Goal: Cardiovascular complication will be avoided Outcome: Progressing   Problem: Activity: Goal: Risk for activity intolerance will decrease Outcome: Progressing   Problem: Nutrition: Goal: Adequate nutrition will be maintained Outcome: Progressing   Problem: Coping: Goal: Level of anxiety will decrease Outcome: Progressing   Problem: Elimination: Goal: Will not experience complications related to bowel motility Outcome: Progressing Goal: Will not experience complications related to urinary retention Outcome: Progressing   Problem: Pain Management: Goal: General experience of comfort will improve Outcome: Progressing   Problem: Safety: Goal: Ability to remain free from injury will improve Outcome: Progressing   Problem: Skin Integrity: Goal: Risk for impaired skin integrity will decrease Outcome: Progressing   Problem: Coping: Goal: Ability to adjust to condition or change in health will improve Outcome: Progressing   Problem: Fluid Volume: Goal: Ability to maintain a balanced intake and output will improve Outcome: Progressing   Problem: Health Behavior/Discharge Planning: Goal: Ability to identify and utilize available resources and services will improve Outcome: Progressing Goal: Ability to manage health-related needs will improve Outcome: Progressing    Problem: Metabolic: Goal: Ability to maintain appropriate glucose levels will improve Outcome: Progressing   Problem: Nutritional: Goal: Maintenance of adequate nutrition will improve Outcome: Progressing Goal: Progress toward achieving an optimal weight will improve Outcome: Progressing   Problem: Skin Integrity: Goal: Risk for impaired skin integrity will decrease Outcome: Progressing   Problem: Tissue Perfusion: Goal: Adequacy of tissue perfusion will improve Outcome: Progressing

## 2023-05-09 NOTE — Progress Notes (Signed)
PROGRESS NOTE   Jeffrey Young  NWG:[id number]    DOB: 23-Nov-1965    DOA: 05/07/2023  PCP: Tracey Harries, MD   I have briefly reviewed patients previous medical records in [facility].  Chief Complaint  Patient presents with   Hematuria    Brief Hospital Course:  57 year old male with medical history significant for stage III colon cancer, currently under surveillance for approximately last 2 years, type II DM, left foot wound, CKD stage IV, hypertension, presented to the ED due to feeling sick, not urinating normally, urine had "pink blood".  Spouse also noted yellowish discoloration of eyes.  UA negative for hematuria.  Admitted for obstructive jaundice, suspected due to malignant biliary stricture and for AKI.  GI/Dr. Elnoria Howard and oncology consulted with plans for EUS and ERCP on 12/19.   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  CBD dilated up to 15 mm and appears to abruptly terminate prior to entering the head of the pancreas.  Imaging features compatible with biliary obstruction, potentially secondary to hepatoduodenal ligament lymphadenopathy. - MRCP: Abrupt malignant appearing biliary stricture at the level of the mid third of the CBD with adjacent porta hepatis adenopathy.  Moderate diffuse intrahepatic and proximal extrahepatic biliary ductal dilatation.  No choledocholithiasis.  Solitary 1.1 cm anterior segment to left liver mass, worrisome for liver metastasis.  Multiple mildly enlarged porta hepatis and portacaval nodes, suspicious for metastatic adenopathy. -Admission LFTs: Alkaline phosphatase  1070, AST 99, ALT 91, total bilirubin 13.2. -Dr. Thomasena Edis input appreciated.  Has started oral Cipro to prophylax against cholangitis.  Plans for EUS +/- FNA and ERCP with stent placement on 12/19.  Acute kidney injury complicating stage IV CKD Unclear etiology.  Did take some NSAIDs for recent dental extraction Presented with creatinine of 5.02 on 12/16.  Prior recent baseline in the 3.1-3.5 range CT abdomen without obstruction Slowly improving, creatinine down to 4.18. Avoid nephrotoxics and hypotension Continue to trend daily BMP  Anemia No overt bleeding.  Suspected anemia of CKD or chronic disease Hemoglobin dropped to 6.9 on 12/17 S/p 1 unit PRBC, hemoglobin up to 7.7 Monitor CBCs daily and transfuse for hemoglobin 7 g or less.  Severe hypokalemia Has been replaced.  Magnesium 1.9.  Essential hypertension Reasonable control.  Continue amlodipine 5 Mg daily and carvedilol 12.5 Mg twice daily.  Type II DM/IDDM A1c 5, well-controlled Currently on Semglee 10 units twice daily and SSI Reasonable inpatient control  Left foot wound Managed by outpatient podiatry, continue follow-up Wound care team input appreciated, continue dressing changes as recommended.  Stage III colon cancer, Currently under surveillance but may have to start treatment based on workup for obstructive jaundice.  Hyponatremia: Stable. Clinically euvolemic.  Monitor periodically.  Body mass index is 25.3 kg/m.   DVT prophylaxis: SCDs Start: 05/08/23 0225     Code Status: Full Code:  Family Communication: None Disposition:  Status is: Inpatient Remains inpatient appropriate because: Awaiting EUS +/- FNA and ERCP with stent tomorrow     Consultants:   Medical oncology GI  Procedures:     Antimicrobials:   Oral Cipro   Subjective:  Pruritus.  Denies abdominal pain,  nausea or vomiting.  Reports that Dr. Truett Perna came by and saw him this morning  Objective:   Vitals:   05/08/23 1611  05/08/23 2046 05/09/23 0624 05/09/23 0830  BP: (!) 171/86 (!) 163/92 (!) 159/81 (!) 160/78  Pulse: 69 61 62 61  Resp:  (!) 22 18 17   Temp:  98.2 F (36.8 C) 98.3 F (36.8 C) 97.9 F (36.6 C)  TempSrc:  Oral Oral Oral  SpO2:  97% 98% 99%  Weight:      Height:        General exam: Young male, moderately built and nourished lying comfortably propped up in bed without distress.  Scleral and skin icterus. Respiratory system: Clear to auscultation. Respiratory effort normal. Cardiovascular system: S1 & S2 heard, RRR. No JVD, murmurs, rubs, gallops or clicks. No pedal edema. Gastrointestinal system: Abdomen is nondistended, soft and nontender. No organomegaly or masses felt. Normal bowel sounds heard. Central nervous system: Alert and oriented. No focal neurological deficits. Extremities: Symmetric 5 x 5 power. Skin: No rashes, lesions or ulcers Psychiatry: Judgement and insight appear normal. Mood & affect appropriate.     Data Reviewed:   I have personally reviewed following labs and imaging studies   CBC: Recent Labs  Lab 05/07/23 1023 05/07/23 2238 05/08/23 0410 05/09/23 0319  WBC 8.9 8.1 7.7 6.9  NEUTROABS 6.6  --   --   --   HGB 7.7* 7.4* 6.9* 7.7*  HCT 22.4* 22.1* 20.5* 21.7*  MCV 87.8 90.9 90.7 89.3  PLT 398 346 344 301    Basic Metabolic Panel: Recent Labs  Lab 05/07/23 1023 05/08/23 0410 05/09/23 0319  NA 130* 133* 130*  K 3.0* 2.9* 3.8  CL 94* 101 104  CO2 24 22 19*  GLUCOSE 177* 182* 144*  BUN 43* 42* 40*  CREATININE 5.02* 4.75* 4.18*  CALCIUM 8.7* 8.1* 7.5*  MG  --  1.9  --     Liver Function Tests: Recent Labs  Lab 05/07/23 1023 05/08/23 0410 05/09/23 0319  AST 99* 95* 89*  ALT 91* 83* 80*  ALKPHOS 1,070* 1,008* 865*  BILITOT 13.2* 11.8* 11.8*  PROT 6.6 5.9* 5.5*  ALBUMIN 2.7* 1.9* 1.8*    CBG: Recent Labs  Lab 05/08/23 1701 05/08/23 2059 05/09/23 0729  GLUCAP 222* 157* 130*    Microbiology Studies:  No results found for this  or any previous visit (from the past 240 hours).  Radiology Studies:  US Abdomen Limited RUQ (LIVER/GB) Result Date: 05/08/2023 CLINICAL DATA:  Painless jaundice Cholecystitis EXAM: ULTRASOUND ABDOMEN LIMITED RIGHT UPPER QUADRANT COMPARISON:  MRI abdomen nodule 06/07/2022 FINDINGS: Gallbladder: Gallstones: Present Sludge: Present Gallbladder Wall: Within normal limits Pericholecystic fluid: Minimal Sonographic Murphy's Sign: Negative per technologist Common bile duct: Diameter: 13 mm Liver: Parenchymal echogenicity: Within normal limits Contours: Normal Lesions: None Portal vein: Patent.  Hepatopetal flow Other: None. IMPRESSION: 1. Cholelithiasis without definitive evidence of acute cholecystitis. If there is continued suspicion for cholecystitis, further evaluation with HIDA scan would be beneficial. 2. Dilated common bile duct again seen which remains suspicious for stricture/mass. Electronically Signed   By: Acquanetta Belling M.D.   On: 05/08/2023 10:14   MR ABDOMEN MRCP WO CONTRAST Result Date: 05/08/2023 CLINICAL DATA:  Inpatient. Jaundice. Chronic kidney disease. Metastatic colon cancer. EXAM: MRI ABDOMEN WITHOUT CONTRAST  (INCLUDING MRCP) TECHNIQUE: Multiplanar multisequence MR imaging of the abdomen was performed. Heavily T2-weighted images of the biliary and pancreatic ducts were obtained, and three-dimensional MRCP images were rendered by post processing.  COMPARISON:  05/07/2023 CT abdomen/pelvis. FINDINGS: Lower chest: Trace posterior right pleural effusion. Hepatobiliary: Normal liver size and configuration. No hepatic steatosis. There is a solitary 1.1 x 0.9 cm anterior segment 2 left liver mass (series 7/image 10 and best seen on diffusion sequence series 5/image 10), worrisome for liver metastasis. No additional liver lesions. Mild gallbladder distention. Moderate diffuse gallbladder wall thickening. Minimal pericholecystic fluid and fat stranding. Small amount of layering sludge in the  gallbladder. No gallstones. Moderate diffuse intrahepatic biliary ductal dilatation. Dilated proximal common bile duct with diameter 13 mm. Abrupt malignant-appearing biliary stricture at the level of the middle third of the common bile duct with diminutive caliber of the lower third of the common bile duct (1 mm diameter). No choledocholithiasis. There is a mildly enlarged peripancreatic/porta hepatis 1.1 cm pathologic lymph node abutting the site of the common bile duct stricture (series 3/image 16). Pancreas: No pancreatic mass or duct dilation. No peripancreatic fluid collections. Unable to confidently assess for pancreas divisum given motion degradation on the MRCP sequences. Spleen: Normal size. No mass. Adrenals/Urinary Tract: Normal adrenals. No hydronephrosis. Small simple bilateral renal cysts, largest 3.5 cm in the interpolar right kidney, for which no follow-up imaging is recommended. Stomach/Bowel: Normal non-distended stomach. Visualized small and large bowel is normal caliber, with no bowel wall thickening. Partially visualized postsurgical changes from partial left colectomy. Vascular/Lymphatic: Normal caliber abdominal aorta. Multiple mildly enlarged porta hepatis nodes up to 1.9 cm (series 5/image 16). Mildly enlarged 1.0 cm portacaval node (series 5/image 22). Other: No abdominal ascites or focal fluid collection. Musculoskeletal: No aggressive appearing focal osseous lesions. IMPRESSION: 1. Abrupt malignant-appearing biliary stricture at the level of the middle third of the common bile duct, with adjacent porta hepatis adenopathy. Moderate diffuse intrahepatic and proximal extrahepatic biliary ductal dilatation. No choledocholithiasis. 2. Mild gallbladder distention with moderate diffuse gallbladder wall thickening. Minimal pericholecystic fluid and fat stranding. Small amount of layering sludge in the gallbladder. No gallstones. Findings are nonspecific, with differential including acute  cholecystitis versus reactive changes to biliary obstruction. 3. Solitary 1.1 cm anterior segment 2 left liver mass, worrisome for liver metastasis. 4. Multiple mildly enlarged porta hepatis and portacaval nodes, suspicious for metastatic adenopathy. 5. Trace posterior right pleural effusion. Electronically Signed   By: Delbert Phenix M.D.   On: 05/08/2023 09:03   MR 3D Recon At Scanner Result Date: 05/08/2023 CLINICAL DATA:  Inpatient. Jaundice. Chronic kidney disease. Metastatic colon cancer. EXAM: MRI ABDOMEN WITHOUT CONTRAST  (INCLUDING MRCP) TECHNIQUE: Multiplanar multisequence MR imaging of the abdomen was performed. Heavily T2-weighted images of the biliary and pancreatic ducts were obtained, and three-dimensional MRCP images were rendered by post processing. COMPARISON:  05/07/2023 CT abdomen/pelvis. FINDINGS: Lower chest: Trace posterior right pleural effusion. Hepatobiliary: Normal liver size and configuration. No hepatic steatosis. There is a solitary 1.1 x 0.9 cm anterior segment 2 left liver mass (series 7/image 10 and best seen on diffusion sequence series 5/image 10), worrisome for liver metastasis. No additional liver lesions. Mild gallbladder distention. Moderate diffuse gallbladder wall thickening. Minimal pericholecystic fluid and fat stranding. Small amount of layering sludge in the gallbladder. No gallstones. Moderate diffuse intrahepatic biliary ductal dilatation. Dilated proximal common bile duct with diameter 13 mm. Abrupt malignant-appearing biliary stricture at the level of the middle third of the common bile duct with diminutive caliber of the lower third of the common bile duct (1 mm diameter). No choledocholithiasis. There is a mildly enlarged peripancreatic/porta hepatis 1.1 cm pathologic lymph node abutting the site  of the common bile duct stricture (series 3/image 16). Pancreas: No pancreatic mass or duct dilation. No peripancreatic fluid collections. Unable to confidently assess  for pancreas divisum given motion degradation on the MRCP sequences. Spleen: Normal size. No mass. Adrenals/Urinary Tract: Normal adrenals. No hydronephrosis. Small simple bilateral renal cysts, largest 3.5 cm in the interpolar right kidney, for which no follow-up imaging is recommended. Stomach/Bowel: Normal non-distended stomach. Visualized small and large bowel is normal caliber, with no bowel wall thickening. Partially visualized postsurgical changes from partial left colectomy. Vascular/Lymphatic: Normal caliber abdominal aorta. Multiple mildly enlarged porta hepatis nodes up to 1.9 cm (series 5/image 16). Mildly enlarged 1.0 cm portacaval node (series 5/image 22). Other: No abdominal ascites or focal fluid collection. Musculoskeletal: No aggressive appearing focal osseous lesions. IMPRESSION: 1. Abrupt malignant-appearing biliary stricture at the level of the middle third of the common bile duct, with adjacent porta hepatis adenopathy. Moderate diffuse intrahepatic and proximal extrahepatic biliary ductal dilatation. No choledocholithiasis. 2. Mild gallbladder distention with moderate diffuse gallbladder wall thickening. Minimal pericholecystic fluid and fat stranding. Small amount of layering sludge in the gallbladder. No gallstones. Findings are nonspecific, with differential including acute cholecystitis versus reactive changes to biliary obstruction. 3. Solitary 1.1 cm anterior segment 2 left liver mass, worrisome for liver metastasis. 4. Multiple mildly enlarged porta hepatis and portacaval nodes, suspicious for metastatic adenopathy. 5. Trace posterior right pleural effusion. Electronically Signed   By: Delbert Phenix M.D.   On: 05/08/2023 09:03   CT ABDOMEN PELVIS WO CONTRAST Result Date: 05/07/2023 CLINICAL DATA:  History of colon cancer, metastatic. No which on this and worsening renal failure. * Tracking Code: BO * EXAM: CT ABDOMEN AND PELVIS WITHOUT CONTRAST TECHNIQUE: Multidetector CT imaging of  the abdomen and pelvis was performed following the standard protocol without IV contrast. RADIATION DOSE REDUCTION: This exam was performed according to the departmental dose-optimization program which includes automated exposure control, adjustment of the mA and/or kV according to patient size and/or use of iterative reconstruction technique. COMPARISON:  03/05/2023 FINDINGS: Lower chest: Dependent atelectasis in the lung bases. Hepatobiliary: Intrahepatic biliary duct dilatation is new in the interval. There may be some associated periportal edema. Portal venous anatomy is prominent and ill-defined. Density in the portal vein is similar to background blood pool density in the aorta. Common duct is dilated up to 15 mm diameter and appears to abruptly terminate prior to entering the head of the pancreas. Gallbladder is distended with ill definition of the gallbladder wall and potential pericholecystic edema/fluid. Pancreas: No focal mass lesion. No dilatation of the main duct. No intraparenchymal cyst. No peripancreatic edema. Spleen: No splenomegaly. No suspicious focal mass lesion. Adrenals/Urinary Tract: No adrenal nodule or mass. Stable right renal cyst. No followup imaging is recommended. Left kidney unremarkable on noncontrast imaging. No hydroureteronephrosis. Bladder is decompressed. Stomach/Bowel: Mild circumferential wall thickening noted distal esophagus. Stomach is unremarkable. No gastric wall thickening. No evidence of outlet obstruction. Duodenum is normally positioned as is the ligament of Treitz. No small bowel wall thickening. No small bowel dilatation. The terminal ileum is normal. The appendix is normal. Status post left hemicolectomy with reanastomosis. Vascular/Lymphatic: No abdominal aortic aneurysm. No abdominal aortic atherosclerotic calcification. Small lymph nodes in the gastrohepatic ligament (image 21/2) are similar to prior. Small lymph nodes also noted hepatoduodenal ligament. Index 14  mm short axis hepatoduodenal ligament node measured previously is 16 mm on 27/2 today. Index left external iliac lymph node measured previously at 14 mm short axis is 14  mm again today on 76/2. Other small lymph nodes along the left pelvic sidewall again noted. Reproductive: The prostate gland and seminal vesicles are unremarkable. Other: No intraperitoneal free fluid. Musculoskeletal: Small left groin hernia contains only fat. No worrisome lytic or sclerotic osseous abnormality. IMPRESSION: 1. New intrahepatic and extrahepatic biliary duct dilatation with possible associated periportal edema. Common duct is dilated up to 15 mm diameter and appears to abruptly terminate prior to entering the head of the pancreas. Imaging features are compatible with biliary obstruction, potentially secondary to the hepatoduodenal ligament lymphadenopathy seen previously. Assessment is limited due to lack of intravenous contrast material. 2. Distended gallbladder with possible gallbladder wall thickening and pericholecystic edema. Right upper quadrant ultrasound recommended to evaluate for acute cholecystitis. 3. No substantial change in lymphadenopathy of the gastrohepatic ligament, hepatoduodenal ligament, and left external iliac chain. 4. Tiny hypoattenuating foci seen previously in the dome of the left liver are not evident on the current exam. 5. Mild circumferential wall thickening distal esophagus. Esophagitis would be a consideration. 6. Small left groin hernia contains only fat. Electronically Signed   By: Kennith Center M.D.   On: 05/07/2023 14:04    Scheduled Meds:    amLODipine  5 mg Oral Daily   carvedilol  12.5 mg Oral BID WC   Chlorhexidine Gluconate Cloth  6 each Topical Daily   ciprofloxacin  500 mg Oral QAC supper   insulin aspart  0-6 Units Subcutaneous TID WC   insulin glargine-yfgn  10 Units Subcutaneous BID   sodium chloride flush  10-40 mL Intracatheter Q12H    Continuous Infusions:     LOS: 2  days     Marcellus Scott, MD,  FACP, [facility], [facility], [facility]   [facility] [place]      To contact the attending provider between 7A-7P or the covering provider during after hours 7P-7A, please log into the web site [email or web address] and access using universal [place] password for that web site. If you do not have the password, please call the hospital operator.  05/09/2023, 9:49 AM

## 2023-05-10 ENCOUNTER — Encounter (HOSPITAL_COMMUNITY): Payer: Self-pay | Admitting: Internal Medicine

## 2023-05-10 ENCOUNTER — Inpatient Hospital Stay (HOSPITAL_COMMUNITY): Payer: Commercial Managed Care - PPO

## 2023-05-10 ENCOUNTER — Inpatient Hospital Stay (HOSPITAL_COMMUNITY): Payer: Commercial Managed Care - PPO | Admitting: Anesthesiology

## 2023-05-10 ENCOUNTER — Encounter (HOSPITAL_COMMUNITY)
Admission: EM | Disposition: A | Discharge status: Home / Self Care | Payer: Self-pay | Source: Home / Self Care | Attending: Internal Medicine

## 2023-05-10 DIAGNOSIS — K631 Perforation of intestine (nontraumatic): Secondary | ICD-10-CM | POA: Diagnosis not present

## 2023-05-10 DIAGNOSIS — K831 Obstruction of bile duct: Secondary | ICD-10-CM | POA: Diagnosis not present

## 2023-05-10 DIAGNOSIS — N179 Acute kidney failure, unspecified: Secondary | ICD-10-CM | POA: Diagnosis not present

## 2023-05-10 DIAGNOSIS — C801 Malignant (primary) neoplasm, unspecified: Secondary | ICD-10-CM

## 2023-05-10 HISTORY — PX: BILIARY STENT PLACEMENT: SHX5538

## 2023-05-10 HISTORY — PX: ERCP: SHX5425

## 2023-05-10 HISTORY — PX: FINE NEEDLE ASPIRATION: SHX5430

## 2023-05-10 HISTORY — PX: ESOPHAGOGASTRODUODENOSCOPY: SHX5428

## 2023-05-10 HISTORY — PX: SPHINCTEROTOMY: SHX5544

## 2023-05-10 HISTORY — PX: EUS: SHX5427

## 2023-05-10 LAB — CBC
HCT: 23.2 % — ABNORMAL LOW (ref 39.0–52.0)
Hemoglobin: 8.2 g/dL — ABNORMAL LOW (ref 13.0–17.0)
MCH: 31.4 pg (ref 26.0–34.0)
MCHC: 35.3 g/dL (ref 30.0–36.0)
MCV: 88.9 fL (ref 80.0–100.0)
Platelets: 301 10*3/uL (ref 150–400)
RBC: 2.61 MIL/uL — ABNORMAL LOW (ref 4.22–5.81)
RDW: 17.9 % — ABNORMAL HIGH (ref 11.5–15.5)
WBC: 7.9 10*3/uL (ref 4.0–10.5)
nRBC: 0 % (ref 0.0–0.2)

## 2023-05-10 LAB — HEPATIC FUNCTION PANEL
ALT: 84 U/L — ABNORMAL HIGH (ref 0–44)
AST: 97 U/L — ABNORMAL HIGH (ref 15–41)
Albumin: 1.9 g/dL — ABNORMAL LOW (ref 3.5–5.0)
Alkaline Phosphatase: 966 U/L — ABNORMAL HIGH (ref 38–126)
Bilirubin, Direct: 8.9 mg/dL — ABNORMAL HIGH (ref 0.0–0.2)
Indirect Bilirubin: 4.7 mg/dL — ABNORMAL HIGH (ref 0.3–0.9)
Total Bilirubin: 13.6 mg/dL — ABNORMAL HIGH (ref <1.2)
Total Protein: 5.9 g/dL — ABNORMAL LOW (ref 6.5–8.1)

## 2023-05-10 LAB — BASIC METABOLIC PANEL
Anion gap: 10 (ref 5–15)
BUN: 40 mg/dL — ABNORMAL HIGH (ref 6–20)
CO2: 18 mmol/L — ABNORMAL LOW (ref 22–32)
Calcium: 8 mg/dL — ABNORMAL LOW (ref 8.9–10.3)
Chloride: 102 mmol/L (ref 98–111)
Creatinine, Ser: 4.14 mg/dL — ABNORMAL HIGH (ref 0.61–1.24)
GFR, Estimated: 16 mL/min — ABNORMAL LOW (ref >60)
Glucose, Bld: 100 mg/dL — ABNORMAL HIGH (ref 70–99)
Potassium: 3.5 mmol/L (ref 3.5–5.1)
Sodium: 130 mmol/L — ABNORMAL LOW (ref 135–145)

## 2023-05-10 LAB — GLUCOSE, CAPILLARY
Glucose-Capillary: 80 mg/dL (ref 70–99)
Glucose-Capillary: 113 mg/dL — ABNORMAL HIGH (ref 70–99)
Glucose-Capillary: 248 mg/dL — ABNORMAL HIGH (ref 70–99)

## 2023-05-10 SURGERY — ERCP, WITH INTERVENTION IF INDICATED
Anesthesia: General

## 2023-05-10 SURGERY — UPPER ENDOSCOPIC ULTRASOUND (EUS) LINEAR
Anesthesia: General

## 2023-05-10 MED ORDER — GLUCAGON HCL RDNA (DIAGNOSTIC) 1 MG IJ SOLR
INTRAMUSCULAR | Status: DC | PRN
  Administered 2023-05-10: .5 mg via INTRAVENOUS

## 2023-05-10 MED ORDER — FENTANYL CITRATE (PF) 100 MCG/2ML IJ SOLN
INTRAMUSCULAR | Status: DC | PRN
  Administered 2023-05-10: 100 ug via INTRAVENOUS

## 2023-05-10 MED ORDER — SUGAMMADEX SODIUM 200 MG/2ML IV SOLN
INTRAVENOUS | Status: DC | PRN
  Administered 2023-05-10: 200 mg via INTRAVENOUS

## 2023-05-10 MED ORDER — ONDANSETRON HCL 4 MG/2ML IJ SOLN
INTRAMUSCULAR | Status: DC | PRN
  Administered 2023-05-10: 4 mg via INTRAVENOUS

## 2023-05-10 MED ORDER — ONDANSETRON HCL 4 MG/2ML IJ SOLN
4.0000 mg | Freq: Four times a day (QID) | INTRAMUSCULAR | Status: DC | PRN
  Filled 2023-05-10: qty 2

## 2023-05-10 MED ORDER — PROPOFOL 10 MG/ML IV BOLUS
INTRAVENOUS | Status: AC
  Filled 2023-05-10: qty 20

## 2023-05-10 MED ORDER — ROCURONIUM BROMIDE 10 MG/ML (PF) SYRINGE
PREFILLED_SYRINGE | INTRAVENOUS | Status: DC | PRN
  Administered 2023-05-10: 70 mg via INTRAVENOUS

## 2023-05-10 MED ORDER — PHENYLEPHRINE 80 MCG/ML (10ML) SYRINGE FOR IV PUSH (FOR BLOOD PRESSURE SUPPORT)
PREFILLED_SYRINGE | INTRAVENOUS | Status: DC | PRN
  Administered 2023-05-10 (×2): 160 ug via INTRAVENOUS
  Administered 2023-05-10: 120 ug via INTRAVENOUS
  Administered 2023-05-10: 160 ug via INTRAVENOUS
  Administered 2023-05-10: 80 ug via INTRAVENOUS

## 2023-05-10 MED ORDER — DEXAMETHASONE SODIUM PHOSPHATE 10 MG/ML IJ SOLN
INTRAMUSCULAR | Status: DC | PRN
  Administered 2023-05-10: 10 mg via INTRAVENOUS

## 2023-05-10 MED ORDER — DICLOFENAC SUPPOSITORY 100 MG
RECTAL | Status: DC | PRN
  Administered 2023-05-10: 100 mg via RECTAL

## 2023-05-10 MED ORDER — FAMOTIDINE IN NACL 20-0.9 MG/50ML-% IV SOLN
20.0000 mg | Freq: Once | INTRAVENOUS | Status: AC
  Administered 2023-05-10: 20 mg via INTRAVENOUS
  Filled 2023-05-10: qty 50

## 2023-05-10 MED ORDER — PROPOFOL 10 MG/ML IV BOLUS
INTRAVENOUS | Status: DC | PRN
  Administered 2023-05-10: 120 mg via INTRAVENOUS

## 2023-05-10 MED ORDER — CIPROFLOXACIN IN D5W 400 MG/200ML IV SOLN
INTRAVENOUS | Status: AC
  Filled 2023-05-10: qty 200

## 2023-05-10 MED ORDER — ALUM & MAG HYDROXIDE-SIMETH 200-200-20 MG/5ML PO SUSP: 15.0000 mL | ORAL | Status: DC | PRN

## 2023-05-10 MED ORDER — HYDRALAZINE HCL 20 MG/ML IJ SOLN: 10.0000 mg | Freq: Four times a day (QID) | INTRAMUSCULAR | Status: DC | PRN

## 2023-05-10 MED ORDER — SODIUM CHLORIDE 0.9 % IV SOLN
INTRAVENOUS | Status: DC | PRN
  Administered 2023-05-10: 10 mL

## 2023-05-10 MED ORDER — SODIUM CHLORIDE 0.9 % IV SOLN: INTRAVENOUS | Status: DC | PRN

## 2023-05-10 MED ORDER — DICLOFENAC SUPPOSITORY 100 MG
RECTAL | Status: AC
  Filled 2023-05-10: qty 1

## 2023-05-10 MED ORDER — EPHEDRINE SULFATE-NACL 50-0.9 MG/10ML-% IV SOSY
PREFILLED_SYRINGE | INTRAVENOUS | Status: DC | PRN
  Administered 2023-05-10 (×2): 10 mg via INTRAVENOUS

## 2023-05-10 MED ORDER — GLUCAGON HCL RDNA (DIAGNOSTIC) 1 MG IJ SOLR
INTRAMUSCULAR | Status: AC
Start: 2023-05-10 — End: ?
  Filled 2023-05-10: qty 1

## 2023-05-10 MED ORDER — LIDOCAINE 2% (20 MG/ML) 5 ML SYRINGE
INTRAMUSCULAR | Status: DC | PRN
  Administered 2023-05-10: 100 mg via INTRAVENOUS

## 2023-05-10 MED ORDER — FENTANYL CITRATE (PF) 100 MCG/2ML IJ SOLN
INTRAMUSCULAR | Status: AC
  Filled 2023-05-10: qty 2

## 2023-05-10 NOTE — Op Note (Signed)
[facility] Patient Name: Jeffrey Young Procedure Date: 05/10/2023 MRN: [id number] Attending MD: Jeani Hawking , MD, [id number] Date of Birth: 1966-02-27 CSN: [id number] Age: 57 Admit Type: Outpatient Procedure:                ERCP Indications:              Malignant stricture of the common bile duct Providers:                Jeani Hawking, MD, Rogue Jury, RN, Norman Clay, RN,                            Salley Scarlet, Technician, Rhodia Albright, Technician Referring MD:              Medicines:                General Anesthesia Complications:            No immediate complications. Estimated Blood Loss:     Estimated blood loss: none. Procedure:                Pre-Anesthesia Assessment:                           - Prior to the procedure, a History and Physical                            was performed, and patient medications and                            allergies were reviewed. The patient's tolerance of                            previous anesthesia was also reviewed. The risks                            and benefits of the procedure and the sedation                            options and risks were discussed with the patient.                            All questions were answered, and informed consent                            was obtained. Prior Anticoagulants: The patient has                            taken no anticoagulant or antiplatelet agents. ASA                            Grade Assessment: III - A patient with severe                            systemic disease. After reviewing the risks and  benefits, the patient was deemed in satisfactory                            condition to undergo the procedure.                           - Sedation was administered by an anesthesia                            professional. General anesthesia was attained.                           After obtaining informed consent, the scope was                             passed under direct vision. Throughout the                            procedure, the patient's blood pressure, pulse, and                            oxygen saturations were monitored continuously. The                            [id number]-[id number] ([id number] Olympus duodenoscope was                            introduced through the mouth, and used to inject                            contrast into and used to inject contrast into the                            bile duct. The ERCP was accomplished without                            difficulty. The patient tolerated the procedure                            well. Scope In: Scope Out: Findings:      The major papilla was normal. The bile duct was deeply cannulated with       the short-nosed traction sphincterotome. Contrast was injected. I       personally interpreted the bile duct images. There was brisk flow of       contrast through the ducts. Image quality was adequate. Contrast       extended to the hepatic ducts. The common bile duct was completely       obstructed by what appeared to be a mass. A short 0.035 inch Soft       Jagwire was passed into the biliary tree. A 10 mm biliary sphincterotomy       was made with a traction (standard) sphincterotome using ERBE       electrocautery. There was no post-sphincterotomy bleeding. One 8.5 Fr by       5 cm plastic stent with  a single external flap and a single internal       flap was placed 4.5 cm into the common bile duct. Clear fluid flowed       through the stent. The stent was in good position.      After several attempts passage of the guidewire into the right       intrahepatic ducts were successful. There was no cannulation of the PD.       The guidewire was secured in the right intrahepatic ducts and constrast       injection revealed a dilated CBD from the mid portion and proximally       into the intrahepatic ducts. The CBD was measured with EUS to be 13 mm.       A sphincterotomy  was created an an 8.5 Fr x 5 cm plastic biliary stent       was placed without difficulty. Clear contrast fluid was draining and       this was documented with fluoroscopy. The stent was in excellent       position. There was no evidence of any bile drainage. Impression:               - The major papilla appeared normal.                           - A biliary tract obstruction secondary to what                            appeared to be a mass was found in the common bile                            duct.                           - A biliary sphincterotomy was performed.                           - One plastic stent was placed into the common bile                            duct. Moderate Sedation:      Not Applicable - Patient had care per Anesthesia. Recommendation:           - Return patient to hospital ward for ongoing care.                           - Regular diet.                           - Further medical management per Medical Oncology.                           - Stent removal or placement of a metallic stent in                            3 months. Procedure Code(s):        --- Professional ---  29562, Endoscopic retrograde                            cholangiopancreatography (ERCP); with placement of                            endoscopic stent into biliary or pancreatic duct,                            including pre- and post-dilation and guide wire                            passage, when performed, including sphincterotomy,                            when performed, each stent                           13086, Endoscopic catheterization of the biliary                            ductal system, radiological supervision and                            interpretation Diagnosis Code(s):        --- Professional ---                           K83.1, Obstruction of bile duct CPT copyright 2022 American Medical Association. All rights reserved. The codes  documented in this report are preliminary and upon coder review may  be revised to meet current compliance requirements. Jeani Hawking, MD Jeani Hawking, MD 05/10/2023 2:08:11 PM This report has been signed electronically. Number of Addenda: 0

## 2023-05-10 NOTE — Plan of Care (Signed)

## 2023-05-10 NOTE — Interval H&P Note (Signed)
History and Physical Interval Note:  05/10/2023 12:04 PM  Jeffrey Young  has presented today for surgery, with the diagnosis of Biliary obstruction.  The various methods of treatment have been discussed with the patient and family. After consideration of risks, benefits and other options for treatment, the patient has consented to  Procedure(s): ENDOSCOPIC RETROGRADE CHOLANGIOPANCREATOGRAPHY (ERCP) (N/A) UPPER ENDOSCOPIC ULTRASOUND (EUS) LINEAR (N/A) FINE NEEDLE ASPIRATION (FNA) LINEAR (N/A) as a surgical intervention.  The patient's history has been reviewed, patient examined, no change in status, stable for surgery.  I have reviewed the patient's chart and labs.  Questions were answered to the patient's satisfaction.     Liesa Tsan D

## 2023-05-10 NOTE — Transfer of Care (Signed)
Immediate Anesthesia Transfer of Care Note  Patient: Jeffrey Young  Procedure(s) Performed: ENDOSCOPIC RETROGRADE CHOLANGIOPANCREATOGRAPHY (ERCP) UPPER ENDOSCOPIC ULTRASOUND (EUS) LINEAR FINE NEEDLE ASPIRATION (FNA) LINEAR ESOPHAGOGASTRODUODENOSCOPY (EGD)  Patient Location: PACU  Anesthesia Type:General  Level of Consciousness: sedated, patient cooperative, and responds to stimulation  Airway & Oxygen Therapy: Patient Spontanous Breathing and Patient connected to face mask oxygen  Post-op Assessment: Report given to RN and Post -op Vital signs reviewed and stable  Post vital signs: Reviewed and stable  Last Vitals:  Vitals Value Taken Time  BP 160/78 05/10/23 1352  Temp    Pulse 64 05/10/23 1353  Resp 17 05/10/23 1353  SpO2 100 % 05/10/23 1353  Vitals shown include unfiled device data.  Last Pain:  Vitals:   05/10/23 1115  TempSrc: Temporal  PainSc: 0-No pain      Patients Stated Pain Goal: 0 (05/10/23 0925)  Complications: No notable events documented.

## 2023-05-10 NOTE — Op Note (Signed)
[facility] Patient Name: Jeffrey Young Procedure Date: 05/10/2023 MRN: [id number] Attending MD: Jeani Hawking , MD, [id number] Date of Birth: Oct 17, 1965 CSN: [id number] Age: 57 Admit Type: Outpatient Procedure:                Upper EUS Indications:              Lymphadenopathy on MRCP Providers:                Jeani Hawking, MD, Rogue Jury, RN, Norman Clay, RN,                            Salley Scarlet, Technician, Rhodia Albright, Technician Referring MD:              Medicines:                General Anesthesia Complications:            No immediate complications. Estimated Blood Loss:     Estimated blood loss: none. Procedure:                Pre-Anesthesia Assessment:                           - Prior to the procedure, a History and Physical                            was performed, and patient medications and                            allergies were reviewed. The patient's tolerance of                            previous anesthesia was also reviewed. The risks                            and benefits of the procedure and the sedation                            options and risks were discussed with the patient.                            All questions were answered, and informed consent                            was obtained. Prior Anticoagulants: The patient has                            taken no anticoagulant or antiplatelet agents. ASA                            Grade Assessment: III - A patient with severe                            systemic disease. After reviewing the risks and  benefits, the patient was deemed in satisfactory                            condition to undergo the procedure.                           - Sedation was administered by an anesthesia                            professional. General anesthesia was attained.                           After obtaining informed consent, the endoscope was                             passed under direct vision. Throughout the                            procedure, the patient's blood pressure, pulse, and                            oxygen saturations were monitored continuously. The                            [id number] ([id number] Olympus linear ultrasound scope                            was introduced through the mouth, and advanced to                            the duodenal bulb. The upper EUS was accomplished                            without difficulty. The patient tolerated the                            procedure well. Scope In: Scope Out: Findings:      ENDOSONOGRAPHIC FINDING: :      One lymph node was visualized with the ultrasound probe in the porta       hepatis region. The node was round, oval, hypoechoic and hyperechoic. It       measured 23 mm by 18 mm. Fine needle aspiration for cytology was       performed. Color Doppler imaging was utilized prior to needle puncture       to confirm a lack of significant vascular structures within the needle       path. Three passes were made with the 22 gauge needle using a       transgastric approach. A stylet was used. A cytotechnologist was present       to evaluate the adequacy of the specimen. Preliminary cytology is       suspicious for adenocarcinoma (final results are pending).      In the portal hepatis region there was a conglomeration of a few lymph       nodes or a single large lymph node measuring 23 mm x 18  mm. There was       evidence of mixed echogenecity. Three passes with the 22 gauge FNA       needle were performed and adequate sampling was obtained. The LN was       firm with passage of the needle. The CBD was also noted to be dilated at       13 mm proximally, but in the mid portion of the CBD, at the superior       portion of the pancreatic head, there was an abrupt cut off. Distal to       this point the duct was 3 mm in diameter. There was no clear evidence of       any pancreatic head mass.  There no other notable abnormalities in the       examined area. Impression:               - One lymph node was visualized and measured in the                            porta hepatis region. Fine needle aspiration                            performed. Moderate Sedation:      Not Applicable - Patient had care per Anesthesia. Recommendation:           - Proceed with ERCP with stenting. Procedure Code(s):        --- Professional ---                           [phone], Esophagogastroduodenoscopy, flexible,                            transoral; with transendoscopic ultrasound-guided                            intramural or transmural fine needle                            aspiration/biopsy(s), (includes endoscopic                            ultrasound examination limited to the esophagus,                            stomach or duodenum, and adjacent structures) Diagnosis Code(s):        --- Professional ---                           R59.1, Generalized enlarged lymph nodes CPT copyright 2022 American Medical Association. All rights reserved. The codes documented in this report are preliminary and upon coder review may  be revised to meet current compliance requirements. Jeani Hawking, MD Jeani Hawking, MD 05/10/2023 2:01:25 PM This report has been signed electronically. Number of Addenda: 0

## 2023-05-10 NOTE — Progress Notes (Addendum)
PROGRESS NOTE   Jeffrey Young  ZOX:[id number]    DOB: 30-Jul-1965    DOA: 05/07/2023  PCP: Tracey Harries, MD   I have briefly reviewed patients previous medical [facility] in [facility].  Chief Complaint  Patient presents with   Hematuria    Brief Hospital Course:  57 year old male with medical history significant for stage III colon cancer, currently under surveillance for approximately last 2 years, type II DM, left foot wound, CKD stage IV, hypertension, presented to the ED due to feeling sick, not urinating normally, urine had "pink blood".  Spouse also noted yellowish discoloration of eyes.  UA negative for hematuria.  Admitted for obstructive jaundice, suspected due to malignant biliary stricture and for AKI.  GI/Dr. Elnoria Howard and oncology consulted with plans for EUS and ERCP on 12/19.   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  CBD dilated up to 15 mm and appears to abruptly terminate prior to entering the head of the pancreas.  Imaging features compatible with biliary obstruction, potentially secondary to hepatoduodenal ligament lymphadenopathy. - MRCP: Abrupt malignant appearing biliary stricture at the level of the mid third of the CBD with adjacent porta hepatis adenopathy.  Moderate diffuse intrahepatic and proximal extrahepatic biliary ductal dilatation.  No choledocholithiasis.  Solitary 1.1 cm anterior segment to left liver mass, worrisome for liver metastasis.  Multiple mildly enlarged porta hepatis and portacaval nodes, suspicious for metastatic adenopathy. -Admission LFTs: Alkaline phosphatase  1070, AST 99, ALT 91, total bilirubin 13.2. -Dr. Bernarda Caffey input appreciated.  Has started oral Cipro to prophylax against cholangitis.  Plans for EUS +/- FNA and ERCP with stent placement on 12/19. Seen this morning prior to procedures.  Total bilirubin has increased from 11.8-13.6.  Post ERCP/stent placement, trend daily LFTs.  Addendum S/p ERCP, biliary sphincterotomy, biliary tract obstruction secondary to mass in CBD, a plastic stent placed in the CBD which needs to be removed or placement of metallic stent in 3 months.  Acute kidney injury complicating stage IV CKD/NAGMA Unclear etiology.  Did take some NSAIDs for recent dental extraction Presented with creatinine of 5.02 on 12/16.  Prior recent baseline in the 3.1-3.5 range CT abdomen without obstruction Slowly improving, creatinine down to 4.18.  Creatinine plateaued/4.14.  Clinically euvolemic. Avoid nephrotoxics and hypotension Continue to trend daily BMP, if no gradual improvement or worsening, consider nephrology consultation in house. Started sodium bicarbonate tabs.  Anemia No overt bleeding.  Suspected anemia of CKD or chronic disease Hemoglobin dropped to 6.9 on 12/17 S/p 1 unit PRBC, hemoglobin up to 7.7 >8.2. Monitor CBCs daily and transfuse for hemoglobin 7 g or less.  Severe hypokalemia Has been replaced.  Magnesium 1.9.  Essential hypertension Reasonable control.  Continue amlodipine 5 Mg daily and carvedilol 12.5 Mg twice daily. BP up slightly since this morning, 167/86, 186/92.  Will increase amlodipine to 10 Mg daily beginning tomorrow. Added as needed IV hydralazine.  Type II DM/IDDM A1c 5, well-controlled Currently on Semglee 10 units twice daily and SSI Reasonable inpatient control  Left foot wound Managed by outpatient podiatry, continue follow-up Wound care team input appreciated, continue dressing changes as recommended.  Stage III colon  cancer, Currently under surveillance but may have to start  treatment based on workup for obstructive jaundice.  Hyponatremia: Stable. Clinically euvolemic.  Monitor periodically.  Body mass index is 25.3 kg/m.   DVT prophylaxis: SCDs Start: 05/08/23 0225     Code Status: Full Code:  Family Communication: None Disposition:  Status is: Inpatient Remains inpatient appropriate because: Awaiting EUS +/- FNA and ERCP with stent 12/19     Consultants:   Medical oncology GI  Procedures:     Antimicrobials:   Oral Cipro   Subjective:  Seen this morning prior to procedures.  Denied any new complaints.  Objective:   Vitals:   05/09/23 2019 05/10/23 0415 05/10/23 0908 05/10/23 1115  BP: 137/76 (!) 153/85 (!) 167/86 (!) 186/92  Pulse: 64 61 60 66  Resp: 17 19 18 13   Temp: 98.4 F (36.9 C) 98.4 F (36.9 C) 98.1 F (36.7 C) 97.8 F (36.6 C)  TempSrc: Oral Oral Oral Temporal  SpO2: 98% 100% 100% 100%  Weight:    87 kg  Height:    6\' 1"  (1.854 m)    General exam: Young male, moderately built and nourished lying comfortably propped up in bed without distress.  Scleral and skin icterus. Respiratory system: Clear to auscultation.  No increased work of breathing. Cardiovascular system: S1 & S2 heard, RRR. No JVD, murmurs, rubs, gallops or clicks. No pedal edema. Gastrointestinal system: Abdomen is nondistended, soft and nontender. No organomegaly or masses felt. Normal bowel sounds heard. Central nervous system: Alert and oriented. No focal neurological deficits. Extremities: Symmetric 5 x 5 power. Skin: No rashes, lesions or ulcers Psychiatry: Judgement and insight appear normal. Mood & affect appropriate.     Data Reviewed:   I have personally reviewed following labs and imaging studies   CBC: Recent Labs  Lab 05/07/23 1023 05/07/23 2238 05/08/23 0410 05/09/23 0319 05/10/23 0406  WBC 8.9   < > 7.7 6.9 7.9  NEUTROABS 6.6  --   --   --   --   HGB 7.7*   < > 6.9* 7.7* 8.2*  HCT 22.4*   < > 20.5* 21.7* 23.2*  MCV 87.8    < > 90.7 89.3 88.9  PLT 398   < > 344 301 301   < > = values in this interval not displayed.    Basic Metabolic Panel: Recent Labs  Lab 05/07/23 1023 05/08/23 0410 05/09/23 0319 05/10/23 0406  NA 130* 133* 130* 130*  K 3.0* 2.9* 3.8 3.5  CL 94* 101 104 102  CO2 24 22 19* 18*  GLUCOSE 177* 182* 144* 100*  BUN 43* 42* 40* 40*  CREATININE 5.02* 4.75* 4.18* 4.14*  CALCIUM 8.7* 8.1* 7.5* 8.0*  MG  --  1.9  --   --     Liver Function Tests: Recent Labs  Lab 05/07/23 1023 05/08/23 0410 05/09/23 0319 05/10/23 0406  AST 99* 95* 89* 97*  ALT 91* 83* 80* 84*  ALKPHOS 1,070* 1,008* 865* 966*  BILITOT 13.2* 11.8* 11.8* 13.6*  PROT 6.6 5.9* 5.5* 5.9*  ALBUMIN 2.7* 1.9* 1.8* 1.9*    CBG: Recent Labs  Lab 05/09/23 1642 05/09/23 2059 05/10/23 0749  GLUCAP 90 183* 80    Microbiology Studies:  No results found for this or any previous visit (from the past 240 hours).  Radiology Studies:  No results found.   Scheduled Meds:    [MAR Hold] amLODipine  5 mg Oral Daily   [MAR Hold] carvedilol  12.5  mg Oral BID WC   [MAR Hold] Chlorhexidine Gluconate Cloth  6 each Topical Daily   [MAR Hold] ciprofloxacin  500 mg Oral QAC supper   [MAR Hold] insulin aspart  0-6 Units Subcutaneous TID WC   [MAR Hold] insulin glargine-yfgn  10 Units Subcutaneous BID   [MAR Hold] pantoprazole  40 mg Oral Daily   [MAR Hold] sodium chloride flush  10-40 mL Intracatheter Q12H    Continuous Infusions:     LOS: 3 days     Marcellus Scott, MD,  FACP, [facility], [facility], [facility]   [facility] [place]      To contact the attending provider between 7A-7P or the covering provider during after hours 7P-7A, please log into the web site [email or web address] and access using universal [place] password for that web site. If you do not have the password, please call the hospital operator.  05/10/2023, 11:22 AM

## 2023-05-10 NOTE — Anesthesia Procedure Notes (Signed)
Procedure Name: Intubation Date/Time: 05/10/2023 12:30 PM  Performed by: Kizzie Fantasia, CRNAPre-anesthesia Checklist: Emergency Drugs available, Patient identified, Suction available, Patient being monitored and Timeout performed Patient Re-evaluated:Patient Re-evaluated prior to induction Oxygen Delivery Method: Circle system utilized Preoxygenation: Pre-oxygenation with 100% oxygen Induction Type: IV induction Ventilation: Mask ventilation without difficulty Laryngoscope Size: Mac Grade View: Grade I Tube type: Oral Tube size: 8.0 mm Number of attempts: 1 Airway Equipment and Method: Stylet Placement Confirmation: ETT inserted through vocal cords under direct vision, positive ETCO2 and breath sounds checked- equal and bilateral Secured at: 23 cm Tube secured with: Tape Dental Injury: Teeth and Oropharynx as per pre-operative assessment

## 2023-05-10 NOTE — Anesthesia Preprocedure Evaluation (Signed)
Anesthesia Evaluation  Patient identified by MRN, date of birth, ID band Patient awake    Reviewed: Allergy & Precautions, NPO status , Patient's Chart, lab work & pertinent test results, reviewed documented beta blocker date and time   Airway Mallampati: III  TM Distance: >3 FB     Dental  (+) Lower Dentures, Upper Dentures   Pulmonary shortness of breath   breath sounds clear to auscultation       Cardiovascular hypertension, +CHF  (-) CAD, (-) Past MI and (-) Cardiac Stents  Rhythm:Regular Rate:Normal     Neuro/Psych neg Seizures  Neuromuscular disease    GI/Hepatic ,neg GERD  ,,(+) neg Cirrhosis        Endo/Other  diabetes, Type 2    Renal/GU CRFRenal disease     Musculoskeletal  (+) Arthritis ,    Abdominal   Peds  Hematology  (+) Blood dyscrasia, anemia   Anesthesia Other Findings   Reproductive/Obstetrics                              Anesthesia Physical Anesthesia Plan  ASA: 2  Anesthesia Plan: General   Post-op Pain Management:    Induction: Intravenous  PONV Risk Score and Plan: 2 and Ondansetron and Dexamethasone  Airway Management Planned: Oral ETT  Additional Equipment:   Intra-op Plan:   Post-operative Plan: Extubation in OR  Informed Consent: I have reviewed the patients History and Physical, chart, labs and discussed the procedure including the risks, benefits and alternatives for the proposed anesthesia with the patient or authorized representative who has indicated his/her understanding and acceptance.     Dental advisory given  Plan Discussed with: CRNA  Anesthesia Plan Comments:          Anesthesia Quick Evaluation

## 2023-05-11 ENCOUNTER — Encounter (HOSPITAL_COMMUNITY): Payer: Self-pay | Admitting: Gastroenterology

## 2023-05-11 DIAGNOSIS — N184 Chronic kidney disease, stage 4 (severe): Secondary | ICD-10-CM | POA: Diagnosis not present

## 2023-05-11 DIAGNOSIS — I10 Essential (primary) hypertension: Secondary | ICD-10-CM

## 2023-05-11 DIAGNOSIS — K831 Obstruction of bile duct: Secondary | ICD-10-CM | POA: Diagnosis not present

## 2023-05-11 DIAGNOSIS — E1169 Type 2 diabetes mellitus with other specified complication: Secondary | ICD-10-CM | POA: Diagnosis not present

## 2023-05-11 DIAGNOSIS — E785 Hyperlipidemia, unspecified: Secondary | ICD-10-CM

## 2023-05-11 DIAGNOSIS — C185 Malignant neoplasm of splenic flexure: Secondary | ICD-10-CM

## 2023-05-11 LAB — CBC
HCT: 25.3 % — ABNORMAL LOW (ref 39.0–52.0)
Hemoglobin: 8.4 g/dL — ABNORMAL LOW (ref 13.0–17.0)
MCH: 30.5 pg (ref 26.0–34.0)
MCHC: 33.2 g/dL (ref 30.0–36.0)
MCV: 92 fL (ref 80.0–100.0)
Platelets: 304 10*3/uL (ref 150–400)
RBC: 2.75 MIL/uL — ABNORMAL LOW (ref 4.22–5.81)
RDW: 18.1 % — ABNORMAL HIGH (ref 11.5–15.5)
WBC: 8 10*3/uL (ref 4.0–10.5)
nRBC: 0 % (ref 0.0–0.2)

## 2023-05-11 LAB — CBC WITH DIFFERENTIAL/PLATELET
Abs Immature Granulocytes: 0.16 10*3/uL — ABNORMAL HIGH (ref 0.00–0.07)
Basophils Absolute: 0.1 10*3/uL (ref 0.0–0.1)
Basophils Relative: 1 %
Eosinophils Absolute: 0.1 10*3/uL (ref 0.0–0.5)
Eosinophils Relative: 1 %
HCT: 23.1 % — ABNORMAL LOW (ref 39.0–52.0)
Hemoglobin: 8 g/dL — ABNORMAL LOW (ref 13.0–17.0)
Immature Granulocytes: 2 %
Lymphocytes Relative: 12 %
Lymphs Abs: 1.1 10*3/uL (ref 0.7–4.0)
MCH: 31.5 pg (ref 26.0–34.0)
MCHC: 34.6 g/dL (ref 30.0–36.0)
MCV: 90.9 fL (ref 80.0–100.0)
Monocytes Absolute: 0.7 10*3/uL (ref 0.1–1.0)
Monocytes Relative: 8 %
Neutro Abs: 6.8 10*3/uL (ref 1.7–7.7)
Neutrophils Relative %: 76 %
Platelets: 292 10*3/uL (ref 150–400)
RBC: 2.54 MIL/uL — ABNORMAL LOW (ref 4.22–5.81)
RDW: 18.7 % — ABNORMAL HIGH (ref 11.5–15.5)
WBC: 9 10*3/uL (ref 4.0–10.5)
nRBC: 0 % (ref 0.0–0.2)

## 2023-05-11 LAB — BASIC METABOLIC PANEL
Anion gap: 9 (ref 5–15)
BUN: 42 mg/dL — ABNORMAL HIGH (ref 6–20)
CO2: 16 mmol/L — ABNORMAL LOW (ref 22–32)
Calcium: 7.9 mg/dL — ABNORMAL LOW (ref 8.9–10.3)
Chloride: 102 mmol/L (ref 98–111)
Creatinine, Ser: 3.37 mg/dL — ABNORMAL HIGH (ref 0.61–1.24)
GFR, Estimated: 20 mL/min — ABNORMAL LOW (ref >60)
Glucose, Bld: 224 mg/dL — ABNORMAL HIGH (ref 70–99)
Potassium: 4.1 mmol/L (ref 3.5–5.1)
Sodium: 127 mmol/L — ABNORMAL LOW (ref 135–145)

## 2023-05-11 LAB — GLUCOSE, CAPILLARY
Glucose-Capillary: 156 mg/dL — ABNORMAL HIGH (ref 70–99)
Glucose-Capillary: 206 mg/dL — ABNORMAL HIGH (ref 70–99)
Glucose-Capillary: 165 mg/dL — ABNORMAL HIGH (ref 70–99)
Glucose-Capillary: 117 mg/dL — ABNORMAL HIGH (ref 70–99)

## 2023-05-11 LAB — SODIUM, URINE, RANDOM: Sodium, Ur: 46 mmol/L

## 2023-05-11 LAB — COMPREHENSIVE METABOLIC PANEL
ALT: 98 U/L — ABNORMAL HIGH (ref 0–44)
AST: 113 U/L — ABNORMAL HIGH (ref 15–41)
Albumin: 1.8 g/dL — ABNORMAL LOW (ref 3.5–5.0)
Alkaline Phosphatase: 960 U/L — ABNORMAL HIGH (ref 38–126)
Anion gap: 10 (ref 5–15)
BUN: 45 mg/dL — ABNORMAL HIGH (ref 6–20)
CO2: 16 mmol/L — ABNORMAL LOW (ref 22–32)
Calcium: 7.7 mg/dL — ABNORMAL LOW (ref 8.9–10.3)
Chloride: 101 mmol/L (ref 98–111)
Creatinine, Ser: 4.72 mg/dL — ABNORMAL HIGH (ref 0.61–1.24)
GFR, Estimated: 13 mL/min — ABNORMAL LOW (ref >60)
Glucose, Bld: 143 mg/dL — ABNORMAL HIGH (ref 70–99)
Potassium: 3.6 mmol/L (ref 3.5–5.1)
Sodium: 127 mmol/L — ABNORMAL LOW (ref 135–145)
Total Bilirubin: 11.7 mg/dL — ABNORMAL HIGH (ref <1.2)
Total Protein: 5.8 g/dL — ABNORMAL LOW (ref 6.5–8.1)

## 2023-05-11 LAB — HEPATIC FUNCTION PANEL
ALT: 93 U/L — ABNORMAL HIGH (ref 0–44)
AST: 102 U/L — ABNORMAL HIGH (ref 15–41)
Albumin: 2 g/dL — ABNORMAL LOW (ref 3.5–5.0)
Alkaline Phosphatase: 1013 U/L — ABNORMAL HIGH (ref 38–126)
Bilirubin, Direct: 8.7 mg/dL — ABNORMAL HIGH (ref 0.0–0.2)
Indirect Bilirubin: 5 mg/dL — ABNORMAL HIGH (ref 0.3–0.9)
Total Bilirubin: 13.7 mg/dL — ABNORMAL HIGH (ref <1.2)
Total Protein: 6.1 g/dL — ABNORMAL LOW (ref 6.5–8.1)

## 2023-05-11 LAB — OSMOLALITY, URINE: Osmolality, Ur: 414 mosm/kg (ref 300–900)

## 2023-05-11 MED ORDER — ENSURE SURGERY PO LIQD
237.0000 mL | Freq: Two times a day (BID) | ORAL | Status: DC
  Filled 2023-05-11 (×3): qty 237

## 2023-05-11 NOTE — Plan of Care (Signed)
  Problem: Education: Goal: Knowledge of General Education information will improve Description: Including pain rating scale, medication(s)/side effects and non-pharmacologic comfort measures Outcome: Progressing   Problem: Health Behavior/Discharge Planning: Goal: Ability to manage health-related needs will improve Outcome: Progressing   Problem: Activity: Goal: Risk for activity intolerance will decrease Outcome: Progressing   Problem: Nutrition: Goal: Adequate nutrition will be maintained Outcome: Progressing   Problem: Coping: Goal: Level of anxiety will decrease Outcome: Progressing   Problem: Elimination: Goal: Will not experience complications related to bowel motility Outcome: Progressing Goal: Will not experience complications related to urinary retention Outcome: Progressing   Problem: Pain Management: Goal: General experience of comfort will improve Outcome: Progressing

## 2023-05-11 NOTE — Plan of Care (Signed)
  Problem: Education: Goal: Knowledge of General Education information will improve Description: Including pain rating scale, medication(s)/side effects and non-pharmacologic comfort measures Outcome: Adequate for Discharge   Problem: Health Behavior/Discharge Planning: Goal: Ability to manage health-related needs will improve Outcome: Adequate for Discharge   Problem: Clinical Measurements: Goal: Will remain free from infection Outcome: Adequate for Discharge Goal: Respiratory complications will improve Outcome: Adequate for Discharge Goal: Cardiovascular complication will be avoided Outcome: Adequate for Discharge   Problem: Activity: Goal: Risk for activity intolerance will decrease Outcome: Adequate for Discharge   Problem: Nutrition: Goal: Adequate nutrition will be maintained Outcome: Adequate for Discharge   Problem: Coping: Goal: Level of anxiety will decrease Outcome: Adequate for Discharge   Problem: Elimination: Goal: Will not experience complications related to bowel motility Outcome: Adequate for Discharge Goal: Will not experience complications related to urinary retention Outcome: Adequate for Discharge   Problem: Pain Management: Goal: General experience of comfort will improve Outcome: Adequate for Discharge   Problem: Safety: Goal: Ability to remain free from injury will improve Outcome: Adequate for Discharge   Problem: Skin Integrity: Goal: Risk for impaired skin integrity will decrease Outcome: Adequate for Discharge   Problem: Coping: Goal: Ability to adjust to condition or change in health will improve Outcome: Adequate for Discharge   Problem: Fluid Volume: Goal: Ability to maintain a balanced intake and output will improve Outcome: Adequate for Discharge   Problem: Health Behavior/Discharge Planning: Goal: Ability to identify and utilize available resources and services will improve Outcome: Adequate for Discharge   Problem:  Nutritional: Goal: Maintenance of adequate nutrition will improve Outcome: Adequate for Discharge   Problem: Skin Integrity: Goal: Risk for impaired skin integrity will decrease Outcome: Adequate for Discharge   Problem: Tissue Perfusion: Goal: Adequacy of tissue perfusion will improve Outcome: Adequate for Discharge

## 2023-05-11 NOTE — Progress Notes (Signed)
Progress Note   Patient: Jeffrey Young ZOX:[id number] DOB: 19-Jul-1965 DOA: 05/07/2023     4 DOS: the patient was seen and examined on 05/11/2023   Brief hospital course: Mr. Salsedo was admitted to the hospital with the working diagnosis of obstructive jaundice, suspected malignant biliary obstruction.   57 year old male with medical history significant for stage III colon cancer, currently under surveillance for approximately last 2 years, type II DM, left foot wound, CKD stage IV, hypertension, presented to the ED due to feeling sick, not urinating normally, urine had "pink blood". Spouse also noted yellowish discoloration of eyes. UA negative for hematuria.  GI/Dr. Elnoria Howard and oncology consulted with plans for EUS and ERCP on 12/19.   12/19 ERCP biliary tract obstruction secondary to what appeared to be a mas, found in the common bile duct.  Biliary sphincterotomy was performed and one plastic stent was placed into the common bile duct.   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  Moderate diffuse intrahepatic and proximal extrahepatic biliary ductal dilatation. No choledocholithiasis.  Mild gallbladder distention with moderate diffuse gallbladder wall thickening.  Solitary 1,1 cm anterior segment 2 left liver mass, worrisome for liber metastasis. Multiple mild enlarged porta hepatis and portacaval nodes.   12/19 upper EUS one lymph node was visualized and measured in the porta hepatis region. Fine needle aspiration performed.  12/19 ERCP biliary obstruction secondary to mass in the common bile duct.  Biliary sphincterectomy performed and one plastic stent was placed in the common bile duct.   Follow up AST 102, ALT 93, total bilirubin 13,7. (Direct 8,9 and indirect 4.7)  ALK P 1,013   Patient may need  longer stent. Follow up liver profile in am.  Follow up with GI recommendations.  Antibiotic therapy with ciprofloxacin.   CKD (chronic kidney disease) stage 4, GFR 15-29 ml/min (HCC) AKI, hyponatremia. Hypokalemia.   Renal function with serum cr at 3,37 with K at 4,1 and serum bicarbonate at 16  Na 127   Plan to continue close follow up renal function and electrolytes.  Check urine electrolytes and urine osmolality.  Unrestricted diet.  Consult nutrition for recommendations, add protein supplementation.   Type 2 diabetes mellitus with hyperlipidemia (HCC) Continue glucose cover and monitoring with insulin sliding scale Continue basal insulin with glargine.     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   Essential hypertension Continue blood pressure control with carvedilol and amlodipine.         Subjective: Patient with no chest pain or dyspnea, he is tolerating po well.   Physical Exam: Vitals:   05/10/23 2200 05/11/23 0209 05/11/23 0615 05/11/23 1050  BP: (!) 148/85 (!) 153/85 (!) 154/85 132/66  Pulse: 70 66 61 (!) 59  Resp: 20  16 20   Temp: 98.2 F (36.8 C) 98.4 F (36.9 C) 98 F (36.7 C) 98.1 F (36.7 C)  TempSrc: Oral Oral Oral Oral  SpO2: 98% 99% 98% 100%  Weight:      Height:       Neurology awake and alert ENT with mild pallor and mild icterus Cardiovascular with S1 and S2 present and regular with no gallops, rubs or murmurs Respiratory with no rales or wheezing Abdomen with no distention  No lower extremity edema  Data Reviewed:    Family Communication:  no family at the bedside   Disposition: Status is: Inpatient Remains inpatient appropriate because: follow up on liver panel.   Planned Discharge Destination: Home      Author: Coralie Keens, MD 05/11/2023 2:57 PM  For on call review [email or web address].

## 2023-05-11 NOTE — Assessment & Plan Note (Signed)
Follow up on biopsy report.

## 2023-05-11 NOTE — Progress Notes (Signed)
Mobility Specialist - Progress Note   05/11/23 1608  Mobility  Activity Ambulated with assistance in hallway  Level of Assistance Standby assist, set-up cues, supervision of patient - no hands on  Assistive Device Front wheel walker  Distance Ambulated (ft) 350 ft  Range of Motion/Exercises Active  Activity Response Tolerated well  Mobility Referral Yes  Mobility Specialist Start Time (ACUTE ONLY) 1550  Mobility Specialist Stop Time (ACUTE ONLY) 1608  Mobility Specialist Time Calculation (min) (ACUTE ONLY) 18 min   Pt was found in bed and agreeable to ambulate. Used open toe wound care system shoes during session. Had x2 LOB due to pt dragging R foot. At EOS returned to bed with all needs met. Call bell in reach.  Billey Chang Mobility Specialist

## 2023-05-11 NOTE — Progress Notes (Addendum)
Jeffrey Young   DOB:[birth year]   NG#:[id number]      ASSESSMENT & PLAN:  1.  Descending colon cancer, stage IIIc with liver mets -Status post partial left colectomy 07/30/2020 - Status post FOLFOX 12 cycles 2022 -Progression of disease noted October 2024.  Patient has not been receiving active cancer treatment, has been observed closely. -MRI/MRCP done this admission shows 2 liver masses concerning for mets -Seen by GI.  ERCP and EUS done 05/10/2023 -Medical oncology/Dr. Truett Perna following patient.  Will decide systemic treatment options after improvement in biliary system.  2.  Report of Gross hematuria, "hematuria "likely secondary to dark urine from hyperbilirubinemia - the anemia is secondary to chronic disease and renal failure 3.  Renal insufficiency -Patient's baseline usually in the 3 range -Improving 5.02--> 3.37 today -Recommend nephrology consult as needed  4.  Obstructive jaundice -likely secondary to lymphadenopathy -Status post stent placement by GI -Monitor LFTs for downward trend  5  Hypertension -Continue hypertensives as ordered -Monitor BP count -Medicine following closely  6.  Diabetes -Monitor blood sugar levels -Insulin as needed -Follow-up with Endocrine/PCP      Code Status Full  Goals of care: Treatable, not curable.  Discharge planning: Discharged to home when medically optimized  Subjective:  Patient seen awake and alert laying in bed.  Reports he feels okay.  States his urine still looks the same to him.  Denies new acute complaints.  Objective:  Vitals:   05/11/23 0615 05/11/23 1050  BP: (!) 154/85 132/66  Pulse: 61 (!) 59  Resp: 16 20  Temp: 98 F (36.7 C) 98.1 F (36.7 C)  SpO2: 98% 100%     Intake/Output Summary (Last 24 hours) at 05/11/2023 1127 Last data filed at 05/11/2023 0820 Gross per 24 hour  Intake 980 ml  Output --  Net 980 ml     REVIEW OF SYSTEMS:   Constitutional: Denies fevers, chills or abnormal night  sweats Eyes: Denies blurriness of vision, double vision or watery eyes Ears, nose, mouth, throat, and face: Denies mucositis or sore throat Respiratory: Denies cough, dyspnea or wheezes Cardiovascular: Denies palpitation, chest discomfort or lower extremity swelling Gastrointestinal:  Denies nausea, heartburn or change in bowel habits Skin: Denies abnormal skin rashes +left metatarsal amp Lymphatics: Denies new lymphadenopathy or easy bruising Neurological: Denies numbness, tingling or new weaknesses Behavioral/Psych: Mood is stable, no new changes  All other systems were reviewed with the patient and are negative.  PHYSICAL EXAMINATION: ECOG PERFORMANCE STATUS: 2 - Symptomatic, <50% confined to bed  Vitals:   05/11/23 0615 05/11/23 1050  BP: (!) 154/85 132/66  Pulse: 61 (!) 59  Resp: 16 20  Temp: 98 F (36.7 C) 98.1 F (36.7 C)  SpO2: 98% 100%   Filed Weights   05/07/23 0933 05/10/23 1115  Weight: 191 lb 12.8 oz (87 kg) 191 lb 12.8 oz (87 kg)    GENERAL: alert, no distress and comfortable SKIN: skin color, texture, turgor are normal, no rashes or significant lesions EYES: normal, conjunctiva are pink and non-injected, +scleral icterus  OROPHARYNX: no exudate, no erythema and lips, buccal mucosa, and tongue normal  NECK: supple, thyroid normal size, non-tender, without nodularity LYMPH: no palpable lymphadenopathy in the cervical, axillary or inguinal LUNGS: clear to auscultation and percussion with normal breathing effort HEART: regular rate & rhythm and no murmurs and no lower extremity edema ABDOMEN: abdomen soft, non-tender and normal bowel sounds MUSCULOSKELETAL: no cyanosis of digits and no clubbing  +left metatarsal amp PSYCH: alert &  oriented x 3 with fluent speech NEURO: no focal motor/sensory deficits   All questions were answered. The patient knows to call the clinic with any problems, questions or concerns.   The total time spent in the appointment was 30  minutes encounter with patient including review of chart and various tests results, discussions about plan of care and coordination of care plan  Dawson Bills, NP 05/11/2023 11:27 AM    Labs Reviewed:  Lab Results  Component Value Date   WBC 8.0 05/11/2023   HGB 8.4 (L) 05/11/2023   HCT 25.3 (L) 05/11/2023   MCV 92.0 05/11/2023   PLT 304 05/11/2023   Recent Labs    05/09/23 0319 05/10/23 0406 05/11/23 0318  NA 130* 130* 127*  K 3.8 3.5 4.1  CL 104 102 102  CO2 19* 18* 16*  GLUCOSE 144* 100* 224*  BUN 40* 40* 42*  CREATININE 4.18* 4.14* 3.37*  CALCIUM 7.5* 8.0* 7.9*  GFRNONAA 16* 16* 20*  PROT 5.5* 5.9* 6.1*  ALBUMIN 1.8* 1.9* 2.0*  AST 89* 97* 102*  ALT 80* 84* 93*  ALKPHOS 865* 966* 1,013*  BILITOT 11.8* 13.6* 13.7*  BILIDIR 7.7* 8.9* 8.7*  IBILI 4.1* 4.7* 5.0*    Studies Reviewed:  DG ERCP Result Date: 05/10/2023 CLINICAL DATA:  ERCP EXAM: ERCP TECHNIQUE: Multiple spot images obtained with the fluoroscopic device and submitted for interpretation post-procedure. FLUOROSCOPY TIME: FLUOROSCOPY TIME 1 minute, 20 seconds (30.8 mGy) COMPARISON:  MRCP-05/08/2023 FINDINGS: 8 spot fluoroscopic images of the right upper abdominal quadrant during ERCP are provided for review. Initial image demonstrates an ERCP probe overlying the right upper abdominal quadrant. Subsequent images demonstrate selective cannulation and opacification of the central aspect of the common hepatic duct with opacification of the intrahepatic biliary tree which appears dilated. Completion images demonstrate placement of a plastic stent overlying the expected location of the CBD. IMPRESSION: ERCP with biliary stent placement as above. These images were submitted for radiologic interpretation only. Please see the procedural report for the amount of contrast and the fluoroscopy time utilized. Electronically Signed   By: Simonne Come M.D.   On: 05/10/2023 15:08   US Abdomen Limited RUQ (LIVER/GB) Result Date:  05/08/2023 CLINICAL DATA:  Painless jaundice Cholecystitis EXAM: ULTRASOUND ABDOMEN LIMITED RIGHT UPPER QUADRANT COMPARISON:  MRI abdomen nodule 06/07/2022 FINDINGS: Gallbladder: Gallstones: Present Sludge: Present Gallbladder Wall: Within normal limits Pericholecystic fluid: Minimal Sonographic Murphy's Sign: Negative per technologist Common bile duct: Diameter: 13 mm Liver: Parenchymal echogenicity: Within normal limits Contours: Normal Lesions: None Portal vein: Patent.  Hepatopetal flow Other: None. IMPRESSION: 1. Cholelithiasis without definitive evidence of acute cholecystitis. If there is continued suspicion for cholecystitis, further evaluation with HIDA scan would be beneficial. 2. Dilated common bile duct again seen which remains suspicious for stricture/mass. Electronically Signed   By: Acquanetta Belling M.D.   On: 05/08/2023 10:14   MR ABDOMEN MRCP WO CONTRAST Result Date: 05/08/2023 CLINICAL DATA:  Inpatient. Jaundice. Chronic kidney disease. Metastatic colon cancer. EXAM: MRI ABDOMEN WITHOUT CONTRAST  (INCLUDING MRCP) TECHNIQUE: Multiplanar multisequence MR imaging of the abdomen was performed. Heavily T2-weighted images of the biliary and pancreatic ducts were obtained, and three-dimensional MRCP images were rendered by post processing. COMPARISON:  05/07/2023 CT abdomen/pelvis. FINDINGS: Lower chest: Trace posterior right pleural effusion. Hepatobiliary: Normal liver size and configuration. No hepatic steatosis. There is a solitary 1.1 x 0.9 cm anterior segment 2 left liver mass (series 7/image 10 and best seen on diffusion sequence series 5/image 10), worrisome  for liver metastasis. No additional liver lesions. Mild gallbladder distention. Moderate diffuse gallbladder wall thickening. Minimal pericholecystic fluid and fat stranding. Small amount of layering sludge in the gallbladder. No gallstones. Moderate diffuse intrahepatic biliary ductal dilatation. Dilated proximal common bile duct with  diameter 13 mm. Abrupt malignant-appearing biliary stricture at the level of the middle third of the common bile duct with diminutive caliber of the lower third of the common bile duct (1 mm diameter). No choledocholithiasis. There is a mildly enlarged peripancreatic/porta hepatis 1.1 cm pathologic lymph node abutting the site of the common bile duct stricture (series 3/image 16). Pancreas: No pancreatic mass or duct dilation. No peripancreatic fluid collections. Unable to confidently assess for pancreas divisum given motion degradation on the MRCP sequences. Spleen: Normal size. No mass. Adrenals/Urinary Tract: Normal adrenals. No hydronephrosis. Small simple bilateral renal cysts, largest 3.5 cm in the interpolar right kidney, for which no follow-up imaging is recommended. Stomach/Bowel: Normal non-distended stomach. Visualized small and large bowel is normal caliber, with no bowel wall thickening. Partially visualized postsurgical changes from partial left colectomy. Vascular/Lymphatic: Normal caliber abdominal aorta. Multiple mildly enlarged porta hepatis nodes up to 1.9 cm (series 5/image 16). Mildly enlarged 1.0 cm portacaval node (series 5/image 22). Other: No abdominal ascites or focal fluid collection. Musculoskeletal: No aggressive appearing focal osseous lesions. IMPRESSION: 1. Abrupt malignant-appearing biliary stricture at the level of the middle third of the common bile duct, with adjacent porta hepatis adenopathy. Moderate diffuse intrahepatic and proximal extrahepatic biliary ductal dilatation. No choledocholithiasis. 2. Mild gallbladder distention with moderate diffuse gallbladder wall thickening. Minimal pericholecystic fluid and fat stranding. Small amount of layering sludge in the gallbladder. No gallstones. Findings are nonspecific, with differential including acute cholecystitis versus reactive changes to biliary obstruction. 3. Solitary 1.1 cm anterior segment 2 left liver mass, worrisome for  liver metastasis. 4. Multiple mildly enlarged porta hepatis and portacaval nodes, suspicious for metastatic adenopathy. 5. Trace posterior right pleural effusion. Electronically Signed   By: Delbert Phenix M.D.   On: 05/08/2023 09:03   MR 3D Recon At Scanner Result Date: 05/08/2023 CLINICAL DATA:  Inpatient. Jaundice. Chronic kidney disease. Metastatic colon cancer. EXAM: MRI ABDOMEN WITHOUT CONTRAST  (INCLUDING MRCP) TECHNIQUE: Multiplanar multisequence MR imaging of the abdomen was performed. Heavily T2-weighted images of the biliary and pancreatic ducts were obtained, and three-dimensional MRCP images were rendered by post processing. COMPARISON:  05/07/2023 CT abdomen/pelvis. FINDINGS: Lower chest: Trace posterior right pleural effusion. Hepatobiliary: Normal liver size and configuration. No hepatic steatosis. There is a solitary 1.1 x 0.9 cm anterior segment 2 left liver mass (series 7/image 10 and best seen on diffusion sequence series 5/image 10), worrisome for liver metastasis. No additional liver lesions. Mild gallbladder distention. Moderate diffuse gallbladder wall thickening. Minimal pericholecystic fluid and fat stranding. Small amount of layering sludge in the gallbladder. No gallstones. Moderate diffuse intrahepatic biliary ductal dilatation. Dilated proximal common bile duct with diameter 13 mm. Abrupt malignant-appearing biliary stricture at the level of the middle third of the common bile duct with diminutive caliber of the lower third of the common bile duct (1 mm diameter). No choledocholithiasis. There is a mildly enlarged peripancreatic/porta hepatis 1.1 cm pathologic lymph node abutting the site of the common bile duct stricture (series 3/image 16). Pancreas: No pancreatic mass or duct dilation. No peripancreatic fluid collections. Unable to confidently assess for pancreas divisum given motion degradation on the MRCP sequences. Spleen: Normal size. No mass. Adrenals/Urinary Tract: Normal  adrenals. No hydronephrosis. Small simple bilateral  renal cysts, largest 3.5 cm in the interpolar right kidney, for which no follow-up imaging is recommended. Stomach/Bowel: Normal non-distended stomach. Visualized small and large bowel is normal caliber, with no bowel wall thickening. Partially visualized postsurgical changes from partial left colectomy. Vascular/Lymphatic: Normal caliber abdominal aorta. Multiple mildly enlarged porta hepatis nodes up to 1.9 cm (series 5/image 16). Mildly enlarged 1.0 cm portacaval node (series 5/image 22). Other: No abdominal ascites or focal fluid collection. Musculoskeletal: No aggressive appearing focal osseous lesions. IMPRESSION: 1. Abrupt malignant-appearing biliary stricture at the level of the middle third of the common bile duct, with adjacent porta hepatis adenopathy. Moderate diffuse intrahepatic and proximal extrahepatic biliary ductal dilatation. No choledocholithiasis. 2. Mild gallbladder distention with moderate diffuse gallbladder wall thickening. Minimal pericholecystic fluid and fat stranding. Small amount of layering sludge in the gallbladder. No gallstones. Findings are nonspecific, with differential including acute cholecystitis versus reactive changes to biliary obstruction. 3. Solitary 1.1 cm anterior segment 2 left liver mass, worrisome for liver metastasis. 4. Multiple mildly enlarged porta hepatis and portacaval nodes, suspicious for metastatic adenopathy. 5. Trace posterior right pleural effusion. Electronically Signed   By: Delbert Phenix M.D.   On: 05/08/2023 09:03   CT ABDOMEN PELVIS WO CONTRAST Result Date: 05/07/2023 CLINICAL DATA:  History of colon cancer, metastatic. No which on this and worsening renal failure. * Tracking Code: BO * EXAM: CT ABDOMEN AND PELVIS WITHOUT CONTRAST TECHNIQUE: Multidetector CT imaging of the abdomen and pelvis was performed following the standard protocol without IV contrast. RADIATION DOSE REDUCTION: This exam was  performed according to the departmental dose-optimization program which includes automated exposure control, adjustment of the mA and/or kV according to patient size and/or use of iterative reconstruction technique. COMPARISON:  03/05/2023 FINDINGS: Lower chest: Dependent atelectasis in the lung bases. Hepatobiliary: Intrahepatic biliary duct dilatation is new in the interval. There may be some associated periportal edema. Portal venous anatomy is prominent and ill-defined. Density in the portal vein is similar to background blood pool density in the aorta. Common duct is dilated up to 15 mm diameter and appears to abruptly terminate prior to entering the head of the pancreas. Gallbladder is distended with ill definition of the gallbladder wall and potential pericholecystic edema/fluid. Pancreas: No focal mass lesion. No dilatation of the main duct. No intraparenchymal cyst. No peripancreatic edema. Spleen: No splenomegaly. No suspicious focal mass lesion. Adrenals/Urinary Tract: No adrenal nodule or mass. Stable right renal cyst. No followup imaging is recommended. Left kidney unremarkable on noncontrast imaging. No hydroureteronephrosis. Bladder is decompressed. Stomach/Bowel: Mild circumferential wall thickening noted distal esophagus. Stomach is unremarkable. No gastric wall thickening. No evidence of outlet obstruction. Duodenum is normally positioned as is the ligament of Treitz. No small bowel wall thickening. No small bowel dilatation. The terminal ileum is normal. The appendix is normal. Status post left hemicolectomy with reanastomosis. Vascular/Lymphatic: No abdominal aortic aneurysm. No abdominal aortic atherosclerotic calcification. Small lymph nodes in the gastrohepatic ligament (image 21/2) are similar to prior. Small lymph nodes also noted hepatoduodenal ligament. Index 14 mm short axis hepatoduodenal ligament node measured previously is 16 mm on 27/2 today. Index left external iliac lymph node  measured previously at 14 mm short axis is 14 mm again today on 76/2. Other small lymph nodes along the left pelvic sidewall again noted. Reproductive: The prostate gland and seminal vesicles are unremarkable. Other: No intraperitoneal free fluid. Musculoskeletal: Small left groin hernia contains only fat. No worrisome lytic or sclerotic osseous abnormality. IMPRESSION: 1. New intrahepatic  and extrahepatic biliary duct dilatation with possible associated periportal edema. Common duct is dilated up to 15 mm diameter and appears to abruptly terminate prior to entering the head of the pancreas. Imaging features are compatible with biliary obstruction, potentially secondary to the hepatoduodenal ligament lymphadenopathy seen previously. Assessment is limited due to lack of intravenous contrast material. 2. Distended gallbladder with possible gallbladder wall thickening and pericholecystic edema. Right upper quadrant ultrasound recommended to evaluate for acute cholecystitis. 3. No substantial change in lymphadenopathy of the gastrohepatic ligament, hepatoduodenal ligament, and left external iliac chain. 4. Tiny hypoattenuating foci seen previously in the dome of the left liver are not evident on the current exam. 5. Mild circumferential wall thickening distal esophagus. Esophagitis would be a consideration. 6. Small left groin hernia contains only fat. Electronically Signed   By: Kennith Center M.D.   On: 05/07/2023 14:04   Mr. Bhatnagar was interviewed and examined.  I reviewed the ERCP and EUS notes.  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  He was found to have an enlarged porta hepatis node.  This node was biopsied.  No pancreas mass.  The obstructive jaundice is likely related to metastatic adenopathy from colon cancer.  I discussed the likely diagnosis with Mr. Savignano.  We will consider beginning systemic therapy for treatment of metastatic colon cancer once  the biliary obstruction has resolved.  Recommendations:  Continue management of biliary obstruction per Dr. Elnoria Howard Outpatient follow-up will be scheduled at the Cancer center I will be out until 05/14/2023, please call oncology as needed

## 2023-05-11 NOTE — Anesthesia Postprocedure Evaluation (Signed)
Anesthesia Post Note  Patient: Jeffrey Young  Procedure(s) Performed: ENDOSCOPIC RETROGRADE CHOLANGIOPANCREATOGRAPHY (ERCP) UPPER ENDOSCOPIC ULTRASOUND (EUS) LINEAR FINE NEEDLE ASPIRATION (FNA) LINEAR ESOPHAGOGASTRODUODENOSCOPY (EGD) SPHINCTEROTOMY BILIARY STENT PLACEMENT     Anesthesia Type: General Anesthetic complications: no   No notable events documented.  Last Vitals:  Vitals:   05/11/23 1050 05/11/23 1510  BP: 132/66 118/65  Pulse: (!) 59 62  Resp: 20 20  Temp: 36.7 C 36.7 C  SpO2: 100% 100%    Last Pain:  Vitals:   05/11/23 1510  TempSrc: Oral  PainSc:                  Mariann Barter

## 2023-05-11 NOTE — Assessment & Plan Note (Signed)
Continue blood pressure control with carvedilol and amlodipine.

## 2023-05-11 NOTE — Hospital Course (Signed)
Mr. Mine was admitted to the hospital with the working diagnosis of obstructive jaundice, suspected malignant biliary obstruction.   57 year old male with medical history significant for stage III colon cancer, currently under surveillance for approximately last 2 years, type II DM, left foot wound, CKD stage IV, hypertension, presented to the ED due to feeling sick, not urinating normally, urine had "pink blood". Spouse also noted yellowish discoloration of eyes. UA negative for hematuria.  GI/Dr. Elnoria Howard and oncology consulted with plans for EUS and ERCP on 12/19.   12/19 ERCP biliary tract obstruction secondary to what appeared to be a mas, found in the common bile duct.  Biliary sphincterotomy was performed and one plastic stent was placed into the common bile duct.

## 2023-05-11 NOTE — Assessment & Plan Note (Signed)
AKI, hyponatremia. Hypokalemia.   Renal function with serum cr at 3,37 with K at 4,1 and serum bicarbonate at 16  Na 127   Plan to continue close follow up renal function and electrolytes.  Check urine electrolytes and urine osmolality.  Unrestricted diet.  Consult nutrition for recommendations, add protein supplementation.

## 2023-05-11 NOTE — Assessment & Plan Note (Signed)
Continue glucose cover and monitoring with insulin sliding scale Continue basal insulin with glargine.  Fasting glucose this am is 133

## 2023-05-11 NOTE — Progress Notes (Signed)
Subjective: The pruritus is less.  Objective: Vital signs in last 24 hours: Temp:  [97.5 F (36.4 C)-98.4 F (36.9 C)] 98 F (36.7 C) (12/20 0615) Pulse Rate:  [60-70] 61 (12/20 0615) Resp:  [13-20] 16 (12/20 0615) BP: (136-186)/(78-92) 154/85 (12/20 0615) SpO2:  [96 %-100 %] 98 % (12/20 0615) Weight:  [87 kg] 87 kg (12/19 1115) Last BM Date : 05/09/23  Intake/Output from previous day: 12/19 0701 - 12/20 0700 In: 740 [P.O.:240; I.V.:500] Out: -  Intake/Output this shift: Total I/O In: 240 [P.O.:240] Out: -   General appearance: alert and no distress GI: soft, non-tender; bowel sounds normal; no masses,  no organomegaly  Lab Results: Recent Labs    05/09/23 0319 05/10/23 0406 05/11/23 0318  WBC 6.9 7.9 8.0  HGB 7.7* 8.2* 8.4*  HCT 21.7* 23.2* 25.3*  PLT 301 301 304   BMET Recent Labs    05/09/23 0319 05/10/23 0406 05/11/23 0318  NA 130* 130* 127*  K 3.8 3.5 4.1  CL 104 102 102  CO2 19* 18* 16*  GLUCOSE 144* 100* 224*  BUN 40* 40* 42*  CREATININE 4.18* 4.14* 3.37*  CALCIUM 7.5* 8.0* 7.9*   LFT Recent Labs    05/11/23 0318  PROT 6.1*  ALBUMIN 2.0*  AST 102*  ALT 93*  ALKPHOS 1,013*  BILITOT 13.7*  BILIDIR 8.7*  IBILI 5.0*   PT/INR No results for input(s): "LABPROT", "INR" in the last 72 hours. Hepatitis Panel No results for input(s): "HEPBSAG", "HCVAB", "HEPAIGM", "HEPBIGM" in the last 72 hours. C-Diff No results for input(s): "CDIFFTOX" in the last 72 hours. Fecal Lactopherrin No results for input(s): "FECLLACTOFRN" in the last 72 hours.  Studies/Results: DG ERCP Result Date: 05/10/2023 CLINICAL DATA:  ERCP EXAM: ERCP TECHNIQUE: Multiple spot images obtained with the fluoroscopic device and submitted for interpretation post-procedure. FLUOROSCOPY TIME: FLUOROSCOPY TIME 1 minute, 20 seconds (30.8 mGy) COMPARISON:  MRCP-05/08/2023 FINDINGS: 8 spot fluoroscopic images of the right upper abdominal quadrant during ERCP are provided for review.  Initial image demonstrates an ERCP probe overlying the right upper abdominal quadrant. Subsequent images demonstrate selective cannulation and opacification of the central aspect of the common hepatic duct with opacification of the intrahepatic biliary tree which appears dilated. Completion images demonstrate placement of a plastic stent overlying the expected location of the CBD. IMPRESSION: ERCP with biliary stent placement as above. These images were submitted for radiologic interpretation only. Please see the procedural report for the amount of contrast and the fluoroscopy time utilized. Electronically Signed   By: Simonne Come M.D.   On: 05/10/2023 15:08    Medications: Scheduled:  amLODipine  5 mg Oral Daily   carvedilol  12.5 mg Oral BID WC   Chlorhexidine Gluconate Cloth  6 each Topical Daily   ciprofloxacin  500 mg Oral QAC supper   insulin aspart  0-6 Units Subcutaneous TID WC   insulin glargine-yfgn  10 Units Subcutaneous BID   pantoprazole  40 mg Oral Daily   sodium chloride flush  10-40 mL Intracatheter Q12H   Continuous:  Assessment/Plan: 1) Obstructive jaundice s/p stent placement. 2) Metastatic colon cancer.   The patient's liver enzymes did not improve after the procedure, but he reports that his pruritus is less.  After placement of the stent there was drainage of the contrast, but no evidence of bilirubin.  He is willing to stay to determine if his liver enzymes and bilirubin decline.  If there is no decline it may be that he needs  a longer stent, a 7 cm stent.  Plan: 1) Monitor liver panel. 2) Maintain Cipro for now. 3) NPO after midnight for possible repeat ERCP. 4) If liver enzymes and TB are decreased, then he can be discharged home.  LOS: 4 days   Milisa Kimbell D 05/11/2023, 8:47 AM

## 2023-05-11 NOTE — Assessment & Plan Note (Addendum)
MRCP with abrupt malignant appearing biliary stricture a the level of the middle third of the common bile duct, with adjacent port hepatis adenopathy.  Moderate diffuse intrahepatic and proximal extrahepatic biliary ductal dilatation. No choledocholithiasis.  Mild gallbladder distention with moderate diffuse gallbladder wall thickening.  Solitary 1,1 cm anterior segment 2 left liver mass, worrisome for liber metastasis. Multiple mild enlarged porta hepatis and portacaval nodes.   12/19 upper EUS one lymph node was visualized and measured in the porta hepatis region. Fine needle aspiration performed.  12/19 ERCP biliary obstruction secondary to mass in the common bile duct.  Biliary sphincterectomy performed and one plastic stent was placed in the common bile duct.   Liver profile continue to improve, with total bilirubin down to 8.7, AST 105., ALT 97 and ALK P 949   Patient is tolerating po well with no nausea or vomiting.  Urine discoloration and jaundice are improving.  Follow up with Oncology and GI as outpatient.  Antibiotic therapy with ciprofloxacin.

## 2023-05-12 DIAGNOSIS — N184 Chronic kidney disease, stage 4 (severe): Secondary | ICD-10-CM | POA: Diagnosis not present

## 2023-05-12 DIAGNOSIS — D509 Iron deficiency anemia, unspecified: Secondary | ICD-10-CM

## 2023-05-12 DIAGNOSIS — S91302S Unspecified open wound, left foot, sequela: Secondary | ICD-10-CM

## 2023-05-12 DIAGNOSIS — E1169 Type 2 diabetes mellitus with other specified complication: Secondary | ICD-10-CM | POA: Diagnosis not present

## 2023-05-12 DIAGNOSIS — S91302A Unspecified open wound, left foot, initial encounter: Secondary | ICD-10-CM

## 2023-05-12 DIAGNOSIS — K831 Obstruction of bile duct: Secondary | ICD-10-CM | POA: Diagnosis not present

## 2023-05-12 DIAGNOSIS — C785 Secondary malignant neoplasm of large intestine and rectum: Secondary | ICD-10-CM

## 2023-05-12 DIAGNOSIS — C185 Malignant neoplasm of splenic flexure: Secondary | ICD-10-CM | POA: Diagnosis not present

## 2023-05-12 LAB — COMPREHENSIVE METABOLIC PANEL
ALT: 97 U/L — ABNORMAL HIGH (ref 0–44)
AST: 105 U/L — ABNORMAL HIGH (ref 15–41)
Albumin: 1.9 g/dL — ABNORMAL LOW (ref 3.5–5.0)
Alkaline Phosphatase: 949 U/L — ABNORMAL HIGH (ref 38–126)
Anion gap: 8 (ref 5–15)
BUN: 50 mg/dL — ABNORMAL HIGH (ref 6–20)
CO2: 18 mmol/L — ABNORMAL LOW (ref 22–32)
Calcium: 7.8 mg/dL — ABNORMAL LOW (ref 8.9–10.3)
Chloride: 103 mmol/L (ref 98–111)
Creatinine, Ser: 4.84 mg/dL — ABNORMAL HIGH (ref 0.61–1.24)
GFR, Estimated: 13 mL/min — ABNORMAL LOW (ref >60)
Glucose, Bld: 133 mg/dL — ABNORMAL HIGH (ref 70–99)
Potassium: 3.6 mmol/L (ref 3.5–5.1)
Sodium: 129 mmol/L — ABNORMAL LOW (ref 135–145)
Total Bilirubin: 8.7 mg/dL — ABNORMAL HIGH (ref <1.2)
Total Protein: 5.5 g/dL — ABNORMAL LOW (ref 6.5–8.1)

## 2023-05-12 LAB — CBC
HCT: 23.4 % — ABNORMAL LOW (ref 39.0–52.0)
Hemoglobin: 7.9 g/dL — ABNORMAL LOW (ref 13.0–17.0)
MCH: 31.1 pg (ref 26.0–34.0)
MCHC: 33.8 g/dL (ref 30.0–36.0)
MCV: 92.1 fL (ref 80.0–100.0)
Platelets: 280 10*3/uL (ref 150–400)
RBC: 2.54 MIL/uL — ABNORMAL LOW (ref 4.22–5.81)
RDW: 18.8 % — ABNORMAL HIGH (ref 11.5–15.5)
WBC: 8.2 10*3/uL (ref 4.0–10.5)
nRBC: 0 % (ref 0.0–0.2)

## 2023-05-12 LAB — GLUCOSE, CAPILLARY
Glucose-Capillary: 112 mg/dL — ABNORMAL HIGH (ref 70–99)
Glucose-Capillary: 111 mg/dL — ABNORMAL HIGH (ref 70–99)
Glucose-Capillary: 123 mg/dL — ABNORMAL HIGH (ref 70–99)
Glucose-Capillary: 164 mg/dL — ABNORMAL HIGH (ref 70–99)

## 2023-05-12 MED ORDER — ADULT MULTIVITAMIN W/MINERALS CH
1.0000 | ORAL_TABLET | Freq: Every day | ORAL | Status: DC
  Administered 2023-05-12 – 2023-05-22 (×11): 1 via ORAL
  Filled 2023-05-12 (×11): qty 1

## 2023-05-12 MED ORDER — SODIUM CHLORIDE 0.9 % IV SOLN
INTRAVENOUS | Status: DC
Start: 2023-05-12 — End: 2023-05-13

## 2023-05-12 NOTE — Progress Notes (Addendum)
   Patient Name: Jeffrey Young Date of Encounter: 05/12/2023, 8:19 AM     Assessment and Plan  Obstructive jaundice from metastatic colon cancer  S/p bilery stent - improved bilirubin/LFT-the stent is treating the obstruction and does not need to be replaced  Moderate medical decision making  --------------------------------------------------------------------------------------  Patient is okay for discharge from GI perspective.  He will have follow-up with oncology I believe and Dr. Elnoria Howard will arrange any other GI follow-up.  He does not need to continue Cipro.   Subjective  The patient continues to report no pruritus and no abdominal pain.   Objective  BP (!) 152/89 (BP Location: Right Arm)   Pulse 63   Temp 98 F (36.7 C) (Oral)   Resp 20   Ht 6\' 1"  (1.854 m)   Wt 87 kg   SpO2 99%   BMI 25.30 kg/m  Abdomen is obese soft and nontender   Recent Labs  Lab 05/09/23 0319 05/10/23 0406 05/11/23 0318 05/11/23 1707 05/12/23 0304  AST 89* 97* 102* 113* 105*  ALT 80* 84* 93* 98* 97*  ALKPHOS 865* 966* 1,013* 960* 949*  BILITOT 11.8* 13.6* 13.7* 11.7* 8.7*  PROT 5.5* 5.9* 6.1* 5.8* 5.5*  ALBUMIN 1.8* 1.9* 2.0* 1.8* 1.9*     Iva Boop, MD, [facility] See AMION on call - gastroenterology for best contact person 05/12/2023 8:19 AM

## 2023-05-12 NOTE — Assessment & Plan Note (Addendum)
SP PRBC transfusion one unit on admission.   Follow up hgb is 7,9 Will check iron panel, patient may need IV iron supplementation.

## 2023-05-12 NOTE — Plan of Care (Signed)
  Problem: Education: Goal: Knowledge of General Education information will improve Description: Including pain rating scale, medication(s)/side effects and non-pharmacologic comfort measures Outcome: Progressing   Problem: Health Behavior/Discharge Planning: Goal: Ability to manage health-related needs will improve Outcome: Progressing   Problem: Clinical Measurements: Goal: Respiratory complications will improve Outcome: Progressing   Problem: Activity: Goal: Risk for activity intolerance will decrease Outcome: Progressing   Problem: Nutrition: Goal: Adequate nutrition will be maintained Outcome: Progressing   Problem: Coping: Goal: Level of anxiety will decrease Outcome: Progressing   Problem: Elimination: Goal: Will not experience complications related to bowel motility Outcome: Progressing Goal: Will not experience complications related to urinary retention Outcome: Progressing   Problem: Pain Management: Goal: General experience of comfort will improve Outcome: Progressing

## 2023-05-12 NOTE — Assessment & Plan Note (Signed)
Follow up as outpatient with podiatry.

## 2023-05-12 NOTE — Plan of Care (Signed)
  Problem: Clinical Measurements: Goal: Ability to maintain clinical measurements within normal limits will improve Outcome: Progressing Goal: Will remain free from infection Outcome: Progressing Goal: Diagnostic test results will improve Outcome: Progressing Goal: Respiratory complications will improve Outcome: Progressing   Problem: Nutrition: Goal: Adequate nutrition will be maintained Outcome: Progressing   Problem: Coping: Goal: Level of anxiety will decrease Outcome: Progressing

## 2023-05-12 NOTE — Progress Notes (Addendum)
Progress Note   Patient: Jeffrey Young VWU:[id number] DOB: August 13, 1965 DOA: 05/07/2023     5 DOS: the patient was seen and examined on 05/12/2023   Brief hospital course: Mr. Stepler was admitted to the hospital with the working diagnosis of obstructive jaundice, suspected malignant biliary obstruction.   57 year old male with medical history significant for stage III colon cancer, currently under surveillance for approximately last 2 years, type II DM, left foot wound, CKD stage IV, hypertension, presented to the ED due to feeling sick, not urinating normally, urine had "pink blood". Spouse also noted yellowish discoloration of eyes. On his initial physical examination he had jaundice, his blood pressure was 173/86, HR 68, RR 16 and 02 saturation 97%, lungs with no wheezing or rales, heart with S1 and S2 present and regular, abdomen with no distention or tenderness, no lower extremity edema.   Na 130, K 3,0 Cl 94 bicarbonate 24, glucose 177 bun 43 cr 5,0  ALK P 1,070 AST 99, ALT 91 Total bilirubin 13.2  Wbc 8,9 hgb 7.7 plt 398   Urine analysis SG 1,017, protein >300, negative leukocytes and negative Hgb. Glucose 500, 0-5 RBC, 0-5 WBC  CT abdomen and pelvis with new intrahepatic and extrahepatic biliary duct dilatation with possible associated periportal edema. Common bile duct dilatated up to 15 mm diameter and appears to abruptly terminate prior to entering the head of the pancreas. Compatible with biliary obstruction, potentially secondary to hepatoduodenal ligament lymphadenopathy.  Distended gallbladder.   EKG 78 bpm, left axis deviation, left anterior fascicular block, sinus rhythm with poor R R wave progression, with no significant ST segment or T wave changes.   MRCP with biliary stricture at the level of the middle third of the common bile duct.  GI/Dr. Elnoria Howard and oncology consulted with plans for EUS and ERCP on 12/19.   12/19 ERCP biliary tract obstruction secondary to what  appeared to be a mas, found in the common bile duct.  Biliary sphincterotomy was performed and one plastic stent was placed into the common bile duct.   12/21 bilirubin and liver enzymes are improving, patient has no abdominal pain, no nausea or vomiting.  Plan to follow up as outpatient with GI and Oncology.  Added IV fluids, renal function not yet back to baseline.   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  Moderate diffuse intrahepatic and proximal extrahepatic biliary ductal dilatation. No choledocholithiasis.  Mild gallbladder distention with moderate diffuse gallbladder wall thickening.  Solitary 1,1 cm anterior segment 2 left liver mass, worrisome for liber metastasis. Multiple mild enlarged porta hepatis and portacaval nodes.   12/19 upper EUS one lymph node was visualized and measured in the porta hepatis region. Fine needle aspiration performed.  12/19 ERCP biliary obstruction secondary to mass in the common bile duct.  Biliary sphincterectomy performed and one plastic stent was placed in the common bile duct.   Liver profile continue to improve, with total bilirubin down to 8.7, AST 105., ALT 97 and ALK P 949   Patient is tolerating po well with no nausea or vomiting.  Urine discoloration and jaundice are improving.  Follow up with Oncology and GI as outpatient.  Antibiotic therapy with ciprofloxacin.   CKD (chronic kidney disease) stage 4, GFR 15-29 ml/min (HCC) AKI, hyponatremia. Hypokalemia.   Renal function has been stable to serum  cr at 4,84 with K at 3,6 and serum bicarbonate ate 18  Na 129  Urinary Na 46 with urine osmolality of 414   Looks like his baseline serum cr is around 3. Will add IV fluids today with NS at 100 ml per hr and follow up renal function and electrolytes in am.     Type 2 diabetes mellitus with hyperlipidemia (HCC) Continue glucose cover and monitoring with insulin sliding scale Continue basal insulin with glargine.  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   Essential hypertension Continue blood pressure control with carvedilol and amlodipine.   Iron deficiency anemia SP PRBC transfusion one unit on admission.   Follow up hgb is 7,9 Will check iron panel, patient may need IV iron supplementation.   Open wound of left foot Follow up as outpatient with podiatry.         Subjective: patient is feeing better, urine color is improving, no chest pain, no nausea or vomiting, no dyspnea.   Physical Exam: Vitals:   05/11/23 2139 05/12/23 0503 05/12/23 0837 05/12/23 1250  BP: (!) 141/91 (!) 152/89 (!) 152/89 113/64  Pulse: (!) 58 63 63 (!) 58  Resp: 20 20  12   Temp: 97.7 F (36.5 C) 98 F (36.7 C)  98.2 F (36.8 C)  TempSrc: Oral Oral  Oral  SpO2: 100% 99%  98%  Weight:      Height:       Neurology awake and alert ENT with mild pallor and icterus Cardiovascular with S1 and S2 present and regular with no gallops, rubs or murmurs Respiratory with no rales or wheezing, no rhonchi Abdomen with no distention  No lower extremity edema  Data Reviewed:    Family Communication: I spoke with patient's wife over the phone at the bedside, we talked in detail about patient's condition, plan of care and prognosis and all questions were addressed.   Disposition: Status is: Inpatient Remains inpatient appropriate because: IV fluids   Planned Discharge Destination: Home      Author: Coralie Keens, MD 05/12/2023 2:09 PM  For on call review [email or web address].

## 2023-05-12 NOTE — Progress Notes (Signed)
Initial Nutrition Assessment  DOCUMENTATION CODES:   Not applicable  INTERVENTION:  - Carb Modified diet per MD.  - Discontinue Ensure Surgery. Patient declining to receive any supplements at this time.  - Encourage intake at all meals.  - Add Multivitamin with minerals daily - Monitor weight trends.   NUTRITION DIAGNOSIS:   Increased nutrient needs related to acute illness as evidenced by estimated needs.  GOAL:   Patient will meet greater than or equal to 90% of their needs  MONITOR:   PO intake, Supplement acceptance, Weight trends  REASON FOR ASSESSMENT:   Consult Assessment of nutrition requirement/status  ASSESSMENT:   57 y.o.male with PMH significant for stage III colon cancer currently under surveillance for approximately last 2 years, type II DM, left foot wound, CKD stage IV,HTN who presented due to feeling sick, not urinating normally, urine had "pink blood". 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  RD working remotely. Called patient via bedside telephone to obtain nutrition history.  Patient reports a UBW of around 200#. Note she was weighed at 191# recently but so has likely lost a little weight recently, but unsure when it started. Per EMR, patient weighed at 208# on 11/4 and now weighed at 191#. This is a 17# or 8% weight loss in 1.5 months, which is significant for the time frame.   Patient endorses eating around 1 or 2 meals a day. Eating normally until 2-3 weeks ago when he had "mouth surgery" and had his teeth removed. He has been eating less since that time.   His appetite has also been decreased since he began to feel ill prior to this admission. He is eating about the same as he has been the past couple weeks but notes his appetite is finally starting to come back. Has been choosing soft foods to eat and tolerating well. Had 2 eggs and toast for breakfast this morning. He is  documented to  be consuming 100% of meals.  Ordered Ensure Surgery previously but has not been drinking. Notes that most supplements give him GI issues. He does not want to try [employer]. States he will try to eat well and if not eating well by next follow up agreeable to try supplement at that time.   Medications reviewed and include: -  Labs reviewed:  Na 129 Creatinine 4.84 HA1C 5.4 Blood Glucose 112-206 x24 hours   NUTRITION - FOCUSED PHYSICAL EXAM:  RD working remotely  Diet Order:   Diet Order             Diet Carb Modified Fluid consistency: Thin; Room service appropriate? Yes  Diet effective now                   EDUCATION NEEDS:  Education needs have been addressed  Skin:  Skin Assessment: Skin Integrity Issues: Skin Integrity Issues:: Diabetic Ulcer Diabetic Ulcer: Left foot  Last BM:  12/20  Height:  Ht Readings from Last 1 Encounters:  05/10/23 6\' 1"  (1.854 m)   Weight:  Wt Readings from Last 1 Encounters:  05/10/23 87 kg    BMI:  Body mass index is 25.3 kg/m.  Estimated Nutritional Needs:  Kcal:  4098-1191 kcals Protein:  105-120 grams Fluid:  >/= 2.1L    Shelle Iron RD, LDN Contact via Secure Chat.

## 2023-05-13 LAB — COMPREHENSIVE METABOLIC PANEL
ALT: 97 U/L — ABNORMAL HIGH (ref 0–44)
AST: 108 U/L — ABNORMAL HIGH (ref 15–41)
Albumin: 1.8 g/dL — ABNORMAL LOW (ref 3.5–5.0)
Alkaline Phosphatase: 862 U/L — ABNORMAL HIGH (ref 38–126)
Anion gap: 8 (ref 5–15)
BUN: 48 mg/dL — ABNORMAL HIGH (ref 6–20)
CO2: 16 mmol/L — ABNORMAL LOW (ref 22–32)
Calcium: 7.4 mg/dL — ABNORMAL LOW (ref 8.9–10.3)
Chloride: 106 mmol/L (ref 98–111)
Creatinine, Ser: 4.71 mg/dL — ABNORMAL HIGH (ref 0.61–1.24)
GFR, Estimated: 14 mL/min — ABNORMAL LOW (ref >60)
Glucose, Bld: 158 mg/dL — ABNORMAL HIGH (ref 70–99)
Potassium: 3.6 mmol/L (ref 3.5–5.1)
Sodium: 130 mmol/L — ABNORMAL LOW (ref 135–145)
Total Bilirubin: 6.9 mg/dL — ABNORMAL HIGH (ref <1.2)
Total Protein: 5.3 g/dL — ABNORMAL LOW (ref 6.5–8.1)

## 2023-05-13 LAB — GLUCOSE, CAPILLARY
Glucose-Capillary: 125 mg/dL — ABNORMAL HIGH (ref 70–99)
Glucose-Capillary: 108 mg/dL — ABNORMAL HIGH (ref 70–99)
Glucose-Capillary: 79 mg/dL (ref 70–99)
Glucose-Capillary: 105 mg/dL — ABNORMAL HIGH (ref 70–99)

## 2023-05-13 MED ORDER — SODIUM CHLORIDE 0.9 % IV SOLN: INTRAVENOUS | Status: AC

## 2023-05-13 NOTE — Plan of Care (Signed)
  Problem: Education: Goal: Knowledge of General Education information will improve Description: Including pain rating scale, medication(s)/side effects and non-pharmacologic comfort measures Outcome: Progressing   Problem: Health Behavior/Discharge Planning: Goal: Ability to manage health-related needs will improve Outcome: Progressing   Problem: Activity: Goal: Risk for activity intolerance will decrease Outcome: Progressing   Problem: Nutrition: Goal: Adequate nutrition will be maintained Outcome: Progressing   Problem: Coping: Goal: Level of anxiety will decrease Outcome: Progressing   Problem: Elimination: Goal: Will not experience complications related to urinary retention Outcome: Progressing   Problem: Pain Management: Goal: General experience of comfort will improve Outcome: Progressing

## 2023-05-13 NOTE — Progress Notes (Signed)
OT Cancellation Note  Patient Details Name: Jeffrey Young MRN: [id number] DOB: 07/28/1965   Cancelled Treatment:    Reason Eval/Treat Not Completed: Other (comment) Patient is sleeping at this time. OT to continue to follow and check back on 12/23.  Rosalio Loud, MS Acute Rehabilitation Department Office# [phone]   05/13/2023, 12:44 PM

## 2023-05-13 NOTE — Progress Notes (Signed)
[facility] Progress Note  Patient: Jeffrey Young     ZOX:[id number]  DOA: 05/07/2023   PCP: Tracey Harries, MD       Brief hospital course: This is a 57 year old man with stage III colon cancer who is currently on surveillance, diabetes mellitus, wound on left foot, chronic kidney disease, hypertension who presents to the hospital for bloody urine.  There was no gross hematuria and a UA was negative for blood.  He was noted to be jaundiced.  Blood work revealed elevated LFTs.  Creatinine was also noted to be elevated from a baseline of 3.53 to a level of 5.02. Imaging suggested possible cholecystitis.  He was made n.p.o. and started on cefepime.  Subjective:  He has no complaints. States he is eating/ drinking well and ambulating w/o issues.   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 If also due to dehydration - U sodium and Osm reviewed - Na+ improved from 127 to 129 after starting IVF on 12/21  Anemia, normocytic - No acute blood loss and likely anemia of chronic disease - 1 unit of packed red  blood cells transfused for Hgb 6.9 on 12/17 - Hgb ~ 8 since then  Hematuria? -UA negative - Suspect his urine is likely orange in color due to jaundice/hyperbilirubinemia and not actually bloody - resolved after CBD stenting  Severe hypokalemia - Replaced -According to home medication list, he was supposed to be taking a tablet of Klor-Con every day but he has not been  Hypertension - Continue carvedilol and Amlodipine   Diabetes mellitus, insulin requiring - cont Lantus at 10 U BID - Continue sliding scale -Hemoglobin A1c is well-controlled at 5  Wound on left foot - Managed as outpatient by podiatry - Appreciate wound care consult-continue dressing changes as recommended      Code Status: Full Code Total time on patient care: 35 minutes DVT prophylaxis:  SCDs Start: 05/08/23 0225     Objective:   Vitals:   05/12/23 1250 05/12/23 1742 05/12/23 2149 05/13/23 0543  BP: 113/64 113/64 (!) 150/84 (!) 168/86  Pulse: (!) 58 (!) 58 63 62  Resp: 12  16 20   Temp: 98.2 F (36.8 C)  98 F (36.7 C) 98.1 F (36.7 C)  TempSrc: Oral  Oral Oral  SpO2: 98%  99% 100%  Weight:      Height:       Filed Weights   05/07/23 0933 05/10/23 1115  Weight: 87 kg 87 kg   Exam: General exam: Appears comfortable  HEENT: oral mucosa moist  Respiratory system: Clear to auscultation.  Cardiovascular system: S1 & S2 heard  Gastrointestinal system: Abdomen soft, non-tender, nondistended. Normal bowel sounds   Extremities: No cyanosis, clubbing or edema Psychiatry:  flat affect      CBC: Recent Labs  Lab 05/07/23 1023 05/07/23 2238 05/09/23 0319 05/10/23 0406 05/11/23 0318 05/11/23 1707 05/12/23 0304  WBC 8.9   < > 6.9 7.9 8.0 9.0 8.2  NEUTROABS 6.6  --   --   --   --  6.8  --   HGB 7.7*   < > 7.7* 8.2* 8.4* 8.0* 7.9*  HCT 22.4*   < > 21.7* 23.2* 25.3* 23.1* 23.4*  MCV 87.8   < > 89.3 88.9 92.0 90.9 92.1  PLT 398   < > 301 301 304 292 280   < > = values in this interval not  displayed.   Basic Metabolic Panel: Recent Labs  Lab 05/08/23 0410 05/09/23 0319 05/10/23 0406 05/11/23 0318 05/11/23 1707 05/12/23 0304 05/13/23 0416  NA 133*   < > 130* 127* 127* 129* 130*  K 2.9*   < > 3.5 4.1 3.6 3.6 3.6  CL 101   < > 102 102 101 103 106  CO2 22   < > 18* 16* 16* 18* 16*  GLUCOSE 182*   < > 100* 224* 143* 133* 158*  BUN 42*   < > 40* 42* 45* 50* 48*  CREATININE 4.75*   < > 4.14* 3.37* 4.72* 4.84* 4.71*  CALCIUM 8.1*   < > 8.0* 7.9* 7.7* 7.8* 7.4*  MG 1.9  --   --   --   --   --   --    < > = values in this interval not displayed.     Scheduled Meds:  amLODipine  5 mg Oral Daily   carvedilol  12.5 mg Oral BID WC   Chlorhexidine Gluconate Cloth  6 each Topical Daily   ciprofloxacin  500 mg Oral QAC supper   insulin aspart  0-6 Units Subcutaneous TID WC   insulin glargine-yfgn  10 Units Subcutaneous BID   multivitamin with minerals  1 tablet Oral Daily   pantoprazole  40 mg Oral Daily   sodium chloride flush  10-40 mL Intracatheter Q12H    Imaging and lab data personally reviewed   Author: Calvert Cantor  05/13/2023 10:01 AM  To contact [facility]>   Check the care team in [facility] and look for the attending/consulting [facility] provider listed  Log into [email or web address] and use [place]'s universal password   Go to> "[facility]"  and find provider  If you still have difficulty reaching the provider, please page the [facility] (Director on Call) for the Hospitalists listed on amion

## 2023-05-13 NOTE — Progress Notes (Signed)
PT Cancellation Note  Patient Details Name: Jeffrey Young MRN: [id number] DOB: 11-Jan-1966   Cancelled Treatment:    Reason Eval/Treat Not Completed: Fatigue/lethargy limiting ability to participate Pt sleeping this morning upon checking in twice.  Spouse awake at this time and requests to allow pt to rest today.  Spouse reports pt has been ambulating and she will ambulate with him later today.  Explained briefly about acute PT and that we would check back tomorrow.   Janan Halter [place] 05/13/2023, 11:58 AM Paulino Door, DPT Physical Therapist Acute Rehabilitation Services Office: [phone]

## 2023-05-14 ENCOUNTER — Inpatient Hospital Stay (HOSPITAL_COMMUNITY): Payer: Commercial Managed Care - PPO

## 2023-05-14 LAB — COMPREHENSIVE METABOLIC PANEL
ALT: 127 U/L — ABNORMAL HIGH (ref 0–44)
AST: 161 U/L — ABNORMAL HIGH (ref 15–41)
Albumin: 1.9 g/dL — ABNORMAL LOW (ref 3.5–5.0)
Alkaline Phosphatase: 929 U/L — ABNORMAL HIGH (ref 38–126)
Anion gap: 9 (ref 5–15)
BUN: 43 mg/dL — ABNORMAL HIGH (ref 6–20)
CO2: 15 mmol/L — ABNORMAL LOW (ref 22–32)
Calcium: 7.7 mg/dL — ABNORMAL LOW (ref 8.9–10.3)
Chloride: 109 mmol/L (ref 98–111)
Creatinine, Ser: 4.23 mg/dL — ABNORMAL HIGH (ref 0.61–1.24)
GFR, Estimated: 15 mL/min — ABNORMAL LOW (ref >60)
Glucose, Bld: 101 mg/dL — ABNORMAL HIGH (ref 70–99)
Potassium: 4 mmol/L (ref 3.5–5.1)
Sodium: 133 mmol/L — ABNORMAL LOW (ref 135–145)
Total Bilirubin: 9.4 mg/dL — ABNORMAL HIGH (ref <1.2)
Total Protein: 5.4 g/dL — ABNORMAL LOW (ref 6.5–8.1)

## 2023-05-14 LAB — GLUCOSE, CAPILLARY
Glucose-Capillary: 107 mg/dL — ABNORMAL HIGH (ref 70–99)
Glucose-Capillary: 76 mg/dL (ref 70–99)
Glucose-Capillary: 87 mg/dL (ref 70–99)
Glucose-Capillary: 102 mg/dL — ABNORMAL HIGH (ref 70–99)

## 2023-05-14 NOTE — Progress Notes (Signed)
OT Cancellation Note  Patient Details Name: Jeffrey Young MRN: [id number] DOB: 18-Sep-1965   Cancelled Treatment:    Reason Eval/Treat Not Completed: OT screened, no needs identified, will sign off Per PT, patients wife is declining therapy at this time reporting she is able to care for patient at current level. OT to sign off at this time.  Rosalio Loud, MS Acute Rehabilitation Department Office# [phone] 05/14/2023, 10:40 AM

## 2023-05-14 NOTE — Progress Notes (Signed)
PT Cancellation/Screen Note  Patient Details Name: Jeffrey Young MRN: [id number] DOB: Nov 30, 1965   Cancelled Treatment:    Reason Eval/Treat Not Completed: PT screened, no needs identified, will sign off. Pt sleeping. Spoke with wife who declines PT and denies need for PT services. Wife reports patient has been walking in hallway. She states she does not have any concerns about caring for pt at home, with respect to his mobility.    Faye Ramsay, PT Acute Rehabilitation  Office: [phone]

## 2023-05-14 NOTE — Progress Notes (Signed)
[facility] Progress Note  Patient: Jeffrey Young     KVQ:[id number]  DOA: 05/07/2023   PCP: Tracey Harries, MD       Brief hospital course: This is a 57 year old man with stage III colon cancer who is currently on surveillance, diabetes mellitus, wound on left foot, chronic kidney disease, hypertension who presents to the hospital for bloody urine.  There was no gross hematuria and a UA was negative for blood.  He was noted to be jaundiced.  Blood work revealed elevated LFTs.  Creatinine was also noted to be elevated from a baseline of 3.53 to a level of 5.02. Imaging suggested possible cholecystitis.  He was made n.p.o. and started on cefepime.  Subjective:  No complaints.   Assessment and Plan: Principal Problem:   Acquired hyperbilirubinemia -Imaging suggestive of CBD obstruction  - 12/19:     -ERCP> CBD obsruction due to mass, plastic stent placed    - EUS> FNA of enlarged porta hepatis LN - path pending - T bili improved from 16 to 5.4 but remaining LFTs rising today. Will touch base with GI about this. 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 If also due to dehydration - U sodium and Osm reviewed - Na+ improved from 127 to 129 after starting IVF on 12/21 - Na improved to 133 today  Anemia, normocytic - No acute blood loss and likely anemia of chronic disease - 1 unit of packed red blood cells transfused for Hgb 6.9 on 12/17 - Hgb ~ 8 since then  Hematuria? -UA  negative - Suspect his urine is likely orange in color due to jaundice/hyperbilirubinemia and not actually bloody - resolved after CBD stenting  Severe hypokalemia - Replaced -According to home medication list, he was supposed to be taking a tablet of Klor-Con every day but he has not been  Hypertension - Continue carvedilol and Amlodipine   Diabetes mellitus, insulin requiring - cont Lantus at 10 U BID - Continue sliding scale -Hemoglobin A1c is well-controlled at 5  Wound on left foot - Managed as outpatient by podiatry - Appreciate wound care consult-continue dressing changes as recommended      Code Status: Full Code Total time on patient care: 35 minutes DVT prophylaxis:  SCDs Start: 05/08/23 0225     Objective:   Vitals:   05/13/23 1324 05/13/23 1946 05/14/23 0934 05/14/23 1253  BP: (!) 149/73 (!) 160/90 (!) 160/90 (!) 161/84  Pulse: 63 64 64 62  Resp: 14 18  20   Temp: 98.7 F (37.1 C) 98.1 F (36.7 C)  97.7 F (36.5 C)  TempSrc: Oral Oral  Oral  SpO2: 99% 100%  99%  Weight:      Height:       Filed Weights   05/07/23 0933 05/10/23 1115  Weight: 87 kg 87 kg   Exam: General exam: Appears comfortable  HEENT: oral mucosa moist Respiratory system: Clear to auscultation.  Cardiovascular system: S1 & S2 heard  Gastrointestinal system: Abdomen soft, non-tender, nondistended. Normal bowel sounds   Extremities: No cyanosis, clubbing or edema Psychiatry:  flat affect      CBC: Recent Labs  Lab 05/09/23 0319 05/10/23 0406 05/11/23 0318 05/11/23 1707 05/12/23 0304  WBC 6.9 7.9 8.0 9.0 8.2  NEUTROABS  --   --   --  6.8  --   HGB 7.7* 8.2* 8.4* 8.0* 7.9*  HCT 21.7* 23.2* 25.3* 23.1* 23.4*  MCV 89.3 88.9 92.0 90.9 92.1  PLT 301 301 304 292 280   Basic Metabolic Panel: Recent Labs  Lab 05/08/23 0410 05/09/23 0319 05/11/23 0318 05/11/23 1707 05/12/23 0304 05/13/23 0416 05/14/23 0217  NA 133*   < > 127* 127* 129* 130* 133*  K 2.9*   < > 4.1  3.6 3.6 3.6 4.0  CL 101   < > 102 101 103 106 109  CO2 22   < > 16* 16* 18* 16* 15*  GLUCOSE 182*   < > 224* 143* 133* 158* 101*  BUN 42*   < > 42* 45* 50* 48* 43*  CREATININE 4.75*   < > 3.37* 4.72* 4.84* 4.71* 4.23*  CALCIUM 8.1*   < > 7.9* 7.7* 7.8* 7.4* 7.7*  MG 1.9  --   --   --   --   --   --    < > = values in this interval not displayed.     Scheduled Meds:  amLODipine  5 mg Oral Daily   carvedilol  12.5 mg Oral BID WC   Chlorhexidine Gluconate Cloth  6 each Topical Daily   insulin aspart  0-6 Units Subcutaneous TID WC   insulin glargine-yfgn  10 Units Subcutaneous BID   multivitamin with minerals  1 tablet Oral Daily   pantoprazole  40 mg Oral Daily   sodium chloride flush  10-40 mL Intracatheter Q12H    Imaging and lab data personally reviewed   Author: Calvert Cantor  05/14/2023 1:40 PM  To contact [facility]>   Check the care team in [facility] and look for the attending/consulting [facility] provider listed  Log into [email or web address] and use [place]'s universal password   Go to> "[facility]"  and find provider  If you still have difficulty reaching the provider, please page the [facility] (Director on Call) for the Hospitalists listed on amion

## 2023-05-14 NOTE — Plan of Care (Signed)
  Problem: Clinical Measurements: Goal: Respiratory complications will improve Outcome: Progressing Goal: Cardiovascular complication will be avoided Outcome: Progressing   Problem: Nutrition: Goal: Adequate nutrition will be maintained Outcome: Progressing   Problem: Elimination: Goal: Will not experience complications related to urinary retention Outcome: Progressing   Problem: Skin Integrity: Goal: Risk for impaired skin integrity will decrease Outcome: Progressing

## 2023-05-15 ENCOUNTER — Inpatient Hospital Stay (HOSPITAL_COMMUNITY): Payer: Commercial Managed Care - PPO

## 2023-05-15 DIAGNOSIS — C801 Malignant (primary) neoplasm, unspecified: Secondary | ICD-10-CM | POA: Diagnosis not present

## 2023-05-15 DIAGNOSIS — K831 Obstruction of bile duct: Secondary | ICD-10-CM | POA: Diagnosis not present

## 2023-05-15 LAB — COMPREHENSIVE METABOLIC PANEL
ALT: 158 U/L — ABNORMAL HIGH (ref 0–44)
AST: 189 U/L — ABNORMAL HIGH (ref 15–41)
Albumin: 2 g/dL — ABNORMAL LOW (ref 3.5–5.0)
Alkaline Phosphatase: 1136 U/L — ABNORMAL HIGH (ref 38–126)
Anion gap: 7 (ref 5–15)
BUN: 44 mg/dL — ABNORMAL HIGH (ref 6–20)
CO2: 16 mmol/L — ABNORMAL LOW (ref 22–32)
Calcium: 8 mg/dL — ABNORMAL LOW (ref 8.9–10.3)
Chloride: 108 mmol/L (ref 98–111)
Creatinine, Ser: 4.3 mg/dL — ABNORMAL HIGH (ref 0.61–1.24)
GFR, Estimated: 15 mL/min — ABNORMAL LOW (ref >60)
Glucose, Bld: 119 mg/dL — ABNORMAL HIGH (ref 70–99)
Potassium: 4 mmol/L (ref 3.5–5.1)
Sodium: 131 mmol/L — ABNORMAL LOW (ref 135–145)
Total Bilirubin: 12.5 mg/dL — ABNORMAL HIGH (ref <1.2)
Total Protein: 6 g/dL — ABNORMAL LOW (ref 6.5–8.1)

## 2023-05-15 LAB — CBC
HCT: 25 % — ABNORMAL LOW (ref 39.0–52.0)
Hemoglobin: 8 g/dL — ABNORMAL LOW (ref 13.0–17.0)
MCH: 30.4 pg (ref 26.0–34.0)
MCHC: 32 g/dL (ref 30.0–36.0)
MCV: 95.1 fL (ref 80.0–100.0)
Platelets: 238 10*3/uL (ref 150–400)
RBC: 2.63 MIL/uL — ABNORMAL LOW (ref 4.22–5.81)
RDW: 20.4 % — ABNORMAL HIGH (ref 11.5–15.5)
WBC: 6.9 10*3/uL (ref 4.0–10.5)
nRBC: 0 % (ref 0.0–0.2)

## 2023-05-15 LAB — GLUCOSE, CAPILLARY
Glucose-Capillary: 136 mg/dL — ABNORMAL HIGH (ref 70–99)
Glucose-Capillary: 177 mg/dL — ABNORMAL HIGH (ref 70–99)
Glucose-Capillary: 124 mg/dL — ABNORMAL HIGH (ref 70–99)
Glucose-Capillary: 127 mg/dL — ABNORMAL HIGH (ref 70–99)

## 2023-05-15 LAB — CYTOLOGY - NON PAP

## 2023-05-15 MED ORDER — GADOBUTROL 1 MMOL/ML IV SOLN
8.0000 mL | Freq: Once | INTRAVENOUS | Status: AC | PRN
  Administered 2023-05-15: 8 mL via INTRAVENOUS

## 2023-05-15 NOTE — Progress Notes (Signed)
[facility] Progress Note  Patient: Jeffrey Young     OZH:[id number]  DOA: 05/07/2023   PCP: Tracey Harries, MD       Brief hospital course: This is a 57 year old man with stage III colon cancer who is currently on surveillance, diabetes mellitus, wound on left foot, chronic kidney disease, hypertension who presents to the hospital for bloody urine.  There was no gross hematuria and a UA was negative for blood.  He was noted to be jaundiced.  Blood work revealed elevated LFTs.  Creatinine was also noted to be elevated from a baseline of 3.53 to a level of 5.02. Imaging suggested possible cholecystitis.  He was made n.p.o. and started on cefepime.  Subjective:  He has no complaints today.   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 If this is his new baseline  Hyponatremia - ? If also due to dehydration - U sodium and Osm reviewed  Anemia, normocytic - No acute  blood loss and likely anemia of chronic disease - 1 unit of packed red blood cells transfused for Hgb 6.9 on 12/17 - Hgb ~ 8 since then  Hematuria? -UA negative - Suspect his urine is likely orange in color due to jaundice/hyperbilirubinemia and not actually bloody - resolved after CBD stenting  Severe hypokalemia - Replaced -According to home medication list, he was supposed to be taking a tablet of Klor-Con every day but he has not been  Hypertension - Continue carvedilol and Amlodipine   Diabetes mellitus, insulin requiring - cont Lantus at 10 U BID - Continue sliding scale -Hemoglobin A1c is well-controlled at 5  Wound on left foot - Managed as outpatient by podiatry - Appreciate wound care consult-continue dressing changes as recommended      Code Status: Full Code Total time on patient care: 35 minutes DVT prophylaxis:  SCDs Start: 05/08/23 0225    Objective:   Vitals:   05/14/23 1253 05/14/23 1959 05/15/23 0425 05/15/23 1131  BP: (!) 161/84 (!) 145/80 (!) 147/73 (!) 154/87  Pulse: 62 (!) 58 66 65  Resp: 20 16 18 18   Temp: 97.7 F (36.5 C) (!) 97.4 F (36.3 C) 98 F (36.7 C) 98 F (36.7 C)  TempSrc: Oral Oral Oral Oral  SpO2: 99% 100% 99% 98%  Weight:      Height:       Filed Weights   05/07/23 0933 05/10/23 1115  Weight:  87 kg 87 kg   Exam: General exam: Appears comfortable  HEENT: oral mucosa moist Respiratory system: Clear to auscultation.  Cardiovascular system: S1 & S2 heard  Gastrointestinal system: Abdomen soft, non-tender, nondistended. Normal bowel sounds   Extremities: No cyanosis, clubbing or edema Psychiatry:  Mood & affect appropriate.    CBC: Recent Labs  Lab 05/10/23 0406 05/11/23 0318 05/11/23 1707 05/12/23 0304 05/15/23 0112  WBC 7.9 8.0 9.0 8.2 6.9  NEUTROABS  --   --  6.8  --   --   HGB 8.2* 8.4* 8.0* 7.9* 8.0*  HCT 23.2* 25.3* 23.1* 23.4* 25.0*  MCV 88.9 92.0 90.9 92.1 95.1  PLT 301 304 292 280 238   Basic  Metabolic Panel: Recent Labs  Lab 05/11/23 1707 05/12/23 0304 05/13/23 0416 05/14/23 0217 05/15/23 0112  NA 127* 129* 130* 133* 131*  K 3.6 3.6 3.6 4.0 4.0  CL 101 103 106 109 108  CO2 16* 18* 16* 15* 16*  GLUCOSE 143* 133* 158* 101* 119*  BUN 45* 50* 48* 43* 44*  CREATININE 4.72* 4.84* 4.71* 4.23* 4.30*  CALCIUM 7.7* 7.8* 7.4* 7.7* 8.0*     Scheduled Meds:  amLODipine  5 mg Oral Daily   carvedilol  12.5 mg Oral BID WC   Chlorhexidine Gluconate Cloth  6 each Topical Daily   insulin aspart  0-6 Units Subcutaneous TID WC   insulin glargine-yfgn  10 Units Subcutaneous BID   multivitamin with minerals  1 tablet Oral Daily   pantoprazole  40 mg Oral Daily   sodium chloride flush  10-40 mL Intracatheter Q12H    Imaging and lab data personally reviewed   Author: Calvert Cantor  05/15/2023 1:13 PM  To contact [facility]>   Check the care team in [facility] and look for the attending/consulting [facility] provider listed  Log into [email or web address] and use [place]'s universal password   Go to> "[facility]"  and find provider  If you still have difficulty reaching the provider, please page the [facility] (Director on Call) for the Hospitalists listed on amion

## 2023-05-15 NOTE — Plan of Care (Signed)
  Problem: Clinical Measurements: Goal: Diagnostic test results will improve Outcome: Progressing   Problem: Activity: Goal: Risk for activity intolerance will decrease Outcome: Progressing   Problem: Skin Integrity: Goal: Risk for impaired skin integrity will decrease Outcome: Progressing   Problem: Education: Goal: Knowledge of General Education information will improve Description: Including pain rating scale, medication(s)/side effects and non-pharmacologic comfort measures Outcome: Adequate for Discharge   Problem: Health Behavior/Discharge Planning: Goal: Ability to manage health-related needs will improve Outcome: Adequate for Discharge   Problem: Clinical Measurements: Goal: Will remain free from infection Outcome: Adequate for Discharge Goal: Respiratory complications will improve Outcome: Adequate for Discharge Goal: Cardiovascular complication will be avoided Outcome: Adequate for Discharge   Problem: Nutrition: Goal: Adequate nutrition will be maintained Outcome: Adequate for Discharge   Problem: Coping: Goal: Level of anxiety will decrease Outcome: Adequate for Discharge   Problem: Elimination: Goal: Will not experience complications related to bowel motility Outcome: Adequate for Discharge Goal: Will not experience complications related to urinary retention Outcome: Adequate for Discharge   Problem: Pain Management: Goal: General experience of comfort will improve Outcome: Adequate for Discharge   Problem: Safety: Goal: Ability to remain free from injury will improve Outcome: Adequate for Discharge   Problem: Skin Integrity: Goal: Risk for impaired skin integrity will decrease Outcome: Adequate for Discharge   Problem: Coping: Goal: Ability to adjust to condition or change in health will improve Outcome: Adequate for Discharge   Problem: Fluid Volume: Goal: Ability to maintain a balanced intake and output will improve Outcome: Adequate for  Discharge   Problem: Health Behavior/Discharge Planning: Goal: Ability to manage health-related needs will improve Outcome: Adequate for Discharge   Problem: Metabolic: Goal: Ability to maintain appropriate glucose levels will improve Outcome: Adequate for Discharge   Problem: Nutritional: Goal: Maintenance of adequate nutrition will improve Outcome: Adequate for Discharge   Problem: Tissue Perfusion: Goal: Adequacy of tissue perfusion will improve Outcome: Adequate for Discharge

## 2023-05-15 NOTE — Progress Notes (Signed)
Subjective: 57 year old black male with a complicated history of Stage III C colon cancer s/p left colectomy on 07/30/20 s/p 12 cycles of  Folfox in 2022, admitted with hematuria. Progression of disease noted in 2024. MRI/MRCP on admission showed A 1.1 cm liver mass concerning for metastatic disease.along with a abrupt malignant appearing stricture in the middle third of the CBD; multiple mildly enlarged porta hepatis and portocaval nodes noted suspicious for metastatic adenopathy. On 05/10/23 a plastic stent was placed after a biliary sphincterotomy. His bilriubin was 11.8 on 05/09/23 but  dropped to 8.7 on 05/12/23 but is back upto 12.5 today.  Patient denies having any abdominal pain or nausea; he denies having any fever or chills.His last BM was 2 days ago.     Objective: Vital signs in last 24 hours: Temp:  [97.4 F (36.3 C)-98 F (36.7 C)] 98 F (36.7 C) (12/24 0425) Pulse Rate:  [58-66] 66 (12/24 0425) Resp:  [16-20] 18 (12/24 0425) BP: (145-161)/(73-90) 147/73 (12/24 0425) SpO2:  [99 %-100 %] 99 % (12/24 0425) Last BM Date : 05/13/23  Intake/Output from previous day: 12/23 0701 - 12/24 0700 In: 490 [P.O.:480; I.V.:10] Out: 1200 [Urine:1200] Intake/Output this shift: Total I/O In: 240 [P.O.:240] Out: -   General appearance: alert, cooperative, and appears stated age; appears jaundiced Resp: clear to auscultation bilaterally Cardio: regular rate and rhythm, S1, S2 normal, no murmur, click, rub or gallop GI: soft, non-tender; bowel sounds normal; no masses,  no organomegaly  Lab Results: Recent Labs    05/15/23 0112  WBC 6.9  HGB 8.0*  HCT 25.0*  PLT 238   BMET Recent Labs    05/13/23 0416 05/14/23 0217 05/15/23 0112  NA 130* 133* 131*  K 3.6 4.0 4.0  CL 106 109 108  CO2 16* 15* 16*  GLUCOSE 158* 101* 119*  BUN 48* 43* 44*  CREATININE 4.71* 4.23* 4.30*  CALCIUM 7.4* 7.7* 8.0*   LFT Recent Labs    05/15/23 0112  PROT 6.0*  ALBUMIN 2.0*  AST 189*  ALT  158*  ALKPHOS 1,136*  BILITOT 12.5*   Studies/Results: US Abdomen Limited RUQ (LIVER/GB) Result Date: 05/14/2023 CLINICAL DATA:  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  CT 05/07/2023.  MRI 05/08/2023 FINDINGS: Gallbladder: Gallbladder is somewhat contracted and filled with heterogeneous material. This is likely combination of stones and sludge. There is progression since the previous study. No gallbladder wall thickening or pericholecystic edema. Murphy's sign is negative. Common bile duct: Diameter: 14 mm, dilated. Liver: Intrahepatic bile duct dilatation is demonstrated. Focal liver lesion seen at previous imaging studies are not demonstrated on the current examination. Portal vein is patent on color Doppler imaging with normal direction of blood flow towards the liver. Other: None. IMPRESSION: 1. Somewhat contracted gallbladder filled with heterogeneous material likely representing stones and sludge. This is progressed since the prior study. 2. Intra and extrahepatic bile duct dilatation consistent with changes due to known biliary malignancy. Electronically Signed   By: Burman Nieves M.D.   On: 05/14/2023 23:10   Medications: I have reviewed the patient's current medications. Prior to Admission:  Medications Prior to Admission  Medication Sig Dispense Refill Last Dose/Taking   amLODipine (NORVASC) 5 MG tablet Take 1 tablet (5 mg total) by mouth daily. 30 tablet 0 05/06/2023   atorvastatin (LIPITOR) 10 MG tablet Take 10 mg by mouth at bedtime.   05/06/2023 Bedtime   cadexomer iodine (  IODOSORB) 0.9 % gel Apply 1 Application topically See admin instructions. Apply to the left foot every other day as a part of wound care   05/06/2023   carvedilol (COREG) 12.5 MG tablet Take 12.5 mg by mouth 2 (two) times daily with a meal.   05/06/2023 Bedtime   Continuous Glucose Sensor (DEXCOM G6 SENSOR) MISC Inject  1 Device into the skin See admin instructions. Place a new sensor into the skin every 10 days   Past Month   docusate sodium (COLACE) 100 MG capsule Take 100 mg by mouth daily as needed for mild constipation.   Taking As Needed   LANTUS SOLOSTAR 100 UNIT/ML Solostar Pen Inject 10-15 Units into the skin See admin instructions. Inject 15 units into the skin in the morning and 10 units at bedtime   05/06/2023   pantoprazole (PROTONIX) 40 MG tablet Take 40 mg by mouth 2 (two) times daily.   05/07/2023   sodium chloride irrigation 0.9 % irrigation Irrigate with 15 mLs as directed See admin instructions. IRRIGATE USING 15 ML'S ON AFFECTED AREA OF THE LEFT FOOT EVERY OTHER DAY- DRY SURROUNDING SKIN, THEN APPLY IODOSORB GEL   05/06/2023   ACCU-CHEK GUIDE test strip USE AS INSTRUCTED TO CHECK BLOOD SUGAR 2 TIMES DAILY      KLOR-CON M20 20 MEQ tablet TAKE 1 TABLET BY MOUTH EVERY DAY (Patient not taking: Reported on 05/07/2023) 90 tablet 0 Not Taking   ondansetron (ZOFRAN) 8 MG tablet Take 1 tablet (8 mg total) by mouth every 8 (eight) hours as needed for nausea or vomiting. Start 72 hours after IV chemotherapy treatment (Patient not taking: Reported on 05/07/2023) 30 tablet 1 Not Taking   prochlorperazine (COMPAZINE) 10 MG tablet Take 1 tablet (10 mg total) by mouth every 6 (six) hours as needed for nausea or vomiting. (Patient not taking: Reported on 05/07/2023) 60 tablet 1 Not Taking   Scheduled:  amLODipine  5 mg Oral Daily   carvedilol  12.5 mg Oral BID WC   Chlorhexidine Gluconate Cloth  6 each Topical Daily   insulin aspart  0-6 Units Subcutaneous TID WC   insulin glargine-yfgn  10 Units Subcutaneous BID   multivitamin with minerals  1 tablet Oral Daily   pantoprazole  40 mg Oral Daily   sodium chloride flush  10-40 mL Intracatheter Q12H   Continuous: GMW:NUUV & mag hydroxide-simeth, hydrALAZINE, ondansetron (ZOFRAN) IV, sodium chloride flush  Assessment/Plan: 1) Elevated liver enzymes-will order  a STAT MRI/MRCP today to check for stent placement. 2) Metastatic colon cancer. 3) ARF superimposed on CKD. 4) Hypertension. 5) AODM. 6) Hyponatremia.   LOS: 8 days  ADDENDUM: Stent placement not clearly visulaized on MRCP done today.Persistent dilation of intraand extrahepatic ducts noted again today with abrupt transition of duct caliber above the pancreatic head. Stent migration could not be ruled out. Dr. Russella Dar is covering our service tomorrow. Plans are to do an ERCP with stent exchange on 05/18/23.  Charna Elizabeth 05/15/2023, 9:13 AM

## 2023-05-15 NOTE — Progress Notes (Addendum)
Jeffrey Young   DOB:Sep 28, 1965   ZO#:[id number]      ASSESSMENT & PLAN:  1.  Descending colon cancer, stage IIIc with liver mets - Status post left partial colectomy 07/30/2020.  Status post 12 cycles FOLFOX chemotherapy in 2022. - Progression of disease October 2024. - Has not been on active cancer treatment has been on close observation. - MRI/MRCP this admission showed 2 liver masses concerning for mets. ERCP and EUS done on 05/10/2023. - However his bilirubin continues to increase, recommend repeat ERCP with a larger stent.  Bilirubin today 12.5 - Medical oncology/Dr. Truett Perna following patient closely.  Systemic treatment options to be decided after improvement in biliary system.  2.  Report of Gross hematuria -hematuria likely secondary to dark urine from hyperbilirubinemia - the anemia is likely secondary to chronic disease and renal failure -Continue to monitor CBC   3.  Renal insufficiency - Baseline usually in the 3 range - Creatinine has been creeping up 4.30 today - Avoid nephrotoxic drugs - Monitor CMP closely - Nephrology consult may be needed  4.  Obstructive jaundice, ongoing -likely secondary to lymphadenopathy -Status post stent placement by GI.  GI eval for repeat ERCP recommended -Monitor LFTs for downward trend   5  Hypertension -Continue hypertensives as ordered -Monitor BP count -Medicine following closely   6.  Diabetes -Monitor blood sugar levels -Insulin as needed -Follow-up with Endocrine/PCP  Code Status Full  Discharge planning: To home when medically optimized  Subjective:  Patient seen awake alert and and oriented times.  Feels well, has been eating and ambulating around in the room.  However reports urine still slightly red and looks the same to him.  No acute distress noted.  Objective:  Vitals:   05/14/23 1959 05/15/23 0425  BP: (!) 145/80 (!) 147/73  Pulse: (!) 58 66  Resp: 16 18  Temp: (!) 97.4 F (36.3 C) 98 F (36.7 C)   SpO2: 100% 99%     Intake/Output Summary (Last 24 hours) at 05/15/2023 9811 Last data filed at 05/15/2023 9147 Gross per 24 hour  Intake 730 ml  Output 1200 ml  Net -470 ml     REVIEW OF SYSTEMS:   Constitutional: Denies fevers, chills or abnormal night sweats Eyes: +yellow eyes Ears, nose, mouth, throat, and face: Denies mucositis or sore throat Respiratory: Denies cough, dyspnea or wheezes Cardiovascular: Denies palpitation, chest discomfort or lower extremity swelling Gastrointestinal:  Denies nausea, heartburn or change in bowel habits Skin: Denies abnormal skin rashes + left metatarsal amputee Lymphatics: Denies new lymphadenopathy or easy bruising Neurological: Denies numbness, tingling or new weaknesses Behavioral/Psych: Mood is stable, no new changes  All other systems were reviewed with the patient and are negative.  PHYSICAL EXAMINATION: ECOG PERFORMANCE STATUS: 1 - Symptomatic but completely ambulatory  Vitals:   05/14/23 1959 05/15/23 0425  BP: (!) 145/80 (!) 147/73  Pulse: (!) 58 66  Resp: 16 18  Temp: (!) 97.4 F (36.3 C) 98 F (36.7 C)  SpO2: 100% 99%   Filed Weights   05/07/23 0933 05/10/23 1115  Weight: 191 lb 12.8 oz (87 kg) 191 lb 12.8 oz (87 kg)    GENERAL: alert, no distress and comfortable SKIN: skin color, texture, turgor are normal, no rashes or significant lesions EYES: normal, conjunctiva are pink and non-injected, +scleral icterus OROPHARYNX: no exudate, no erythema and lips, buccal mucosa, and tongue normal  NECK: supple, thyroid normal size, non-tender, without nodularity LYMPH: no palpable lymphadenopathy in the cervical, axillary or  inguinal LUNGS: clear to auscultation and percussion with normal breathing effort HEART: regular rate & rhythm and no murmurs and no lower extremity edema ABDOMEN: abdomen soft, non-tender and normal bowel sounds MUSCULOSKELETAL: no cyanosis of digits and no clubbing  PSYCH: alert & oriented x 3 with  fluent speech NEURO: no focal motor/sensory deficits   All questions were answered. The patient knows to call the clinic with any problems, questions or concerns.   The total time spent in the appointment was 30 minutes encounter with patient including review of chart and various tests results, discussions about plan of care and coordination of care plan  Dawson Bills, NP 05/15/2023 9:06 AM    Labs Reviewed:  Lab Results  Component Value Date   WBC 6.9 05/15/2023   HGB 8.0 (L) 05/15/2023   HCT 25.0 (L) 05/15/2023   MCV 95.1 05/15/2023   PLT 238 05/15/2023   Recent Labs    05/09/23 0319 05/10/23 0406 05/11/23 0318 05/11/23 1707 05/13/23 0416 05/14/23 0217 05/15/23 0112  NA 130* 130* 127*   < > 130* 133* 131*  K 3.8 3.5 4.1   < > 3.6 4.0 4.0  CL 104 102 102   < > 106 109 108  CO2 19* 18* 16*   < > 16* 15* 16*  GLUCOSE 144* 100* 224*   < > 158* 101* 119*  BUN 40* 40* 42*   < > 48* 43* 44*  CREATININE 4.18* 4.14* 3.37*   < > 4.71* 4.23* 4.30*  CALCIUM 7.5* 8.0* 7.9*   < > 7.4* 7.7* 8.0*  GFRNONAA 16* 16* 20*   < > 14* 15* 15*  PROT 5.5* 5.9* 6.1*   < > 5.3* 5.4* 6.0*  ALBUMIN 1.8* 1.9* 2.0*   < > 1.8* 1.9* 2.0*  AST 89* 97* 102*   < > 108* 161* 189*  ALT 80* 84* 93*   < > 97* 127* 158*  ALKPHOS 865* 966* 1,013*   < > 862* 929* 1,136*  BILITOT 11.8* 13.6* 13.7*   < > 6.9* 9.4* 12.5*  BILIDIR 7.7* 8.9* 8.7*  --   --   --   --   IBILI 4.1* 4.7* 5.0*  --   --   --   --    < > = values in this interval not displayed.    Studies Reviewed:  US Abdomen Limited RUQ (LIVER/GB) Result Date: 05/14/2023 CLINICAL DATA:  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  CT 05/07/2023.  MRI 05/08/2023 FINDINGS: Gallbladder: Gallbladder is somewhat contracted and filled with heterogeneous material. This is likely combination of stones and sludge. There is progression since the previous study. No  gallbladder wall thickening or pericholecystic edema. Murphy's sign is negative. Common bile duct: Diameter: 14 mm, dilated. Liver: Intrahepatic bile duct dilatation is demonstrated. Focal liver lesion seen at previous imaging studies are not demonstrated on the current examination. Portal vein is patent on color Doppler imaging with normal direction of blood flow towards the liver. Other: None. IMPRESSION: 1. Somewhat contracted gallbladder filled with heterogeneous material likely representing stones and sludge. This is progressed since the prior study. 2. Intra and extrahepatic bile duct dilatation consistent with changes due to known biliary malignancy. Electronically Signed   By: Burman Nieves M.D.   On: 05/14/2023 23:10   DG ERCP Result Date: 05/10/2023 CLINICAL DATA:  ERCP EXAM: ERCP TECHNIQUE: Multiple spot images obtained with the fluoroscopic device and  submitted for interpretation post-procedure. FLUOROSCOPY TIME: FLUOROSCOPY TIME 1 minute, 20 seconds (30.8 mGy) COMPARISON:  MRCP-05/08/2023 FINDINGS: 8 spot fluoroscopic images of the right upper abdominal quadrant during ERCP are provided for review. Initial image demonstrates an ERCP probe overlying the right upper abdominal quadrant. Subsequent images demonstrate selective cannulation and opacification of the central aspect of the common hepatic duct with opacification of the intrahepatic biliary tree which appears dilated. Completion images demonstrate placement of a plastic stent overlying the expected location of the CBD. IMPRESSION: ERCP with biliary stent placement as above. These images were submitted for radiologic interpretation only. Please see the procedural report for the amount of contrast and the fluoroscopy time utilized. Electronically Signed   By: Simonne Come M.D.   On: 05/10/2023 15:08   US Abdomen Limited RUQ (LIVER/GB) Result Date: 05/08/2023 CLINICAL DATA:  Painless jaundice Cholecystitis EXAM: ULTRASOUND ABDOMEN LIMITED  RIGHT UPPER QUADRANT COMPARISON:  MRI abdomen nodule 06/07/2022 FINDINGS: Gallbladder: Gallstones: Present Sludge: Present Gallbladder Wall: Within normal limits Pericholecystic fluid: Minimal Sonographic Murphy's Sign: Negative per technologist Common bile duct: Diameter: 13 mm Liver: Parenchymal echogenicity: Within normal limits Contours: Normal Lesions: None Portal vein: Patent.  Hepatopetal flow Other: None. IMPRESSION: 1. Cholelithiasis without definitive evidence of acute cholecystitis. If there is continued suspicion for cholecystitis, further evaluation with HIDA scan would be beneficial. 2. Dilated common bile duct again seen which remains suspicious for stricture/mass. Electronically Signed   By: Acquanetta Belling M.D.   On: 05/08/2023 10:14   MR ABDOMEN MRCP WO CONTRAST Result Date: 05/08/2023 CLINICAL DATA:  Inpatient. Jaundice. Chronic kidney disease. Metastatic colon cancer. EXAM: MRI ABDOMEN WITHOUT CONTRAST  (INCLUDING MRCP) TECHNIQUE: Multiplanar multisequence MR imaging of the abdomen was performed. Heavily T2-weighted images of the biliary and pancreatic ducts were obtained, and three-dimensional MRCP images were rendered by post processing. COMPARISON:  05/07/2023 CT abdomen/pelvis. FINDINGS: Lower chest: Trace posterior right pleural effusion. Hepatobiliary: Normal liver size and configuration. No hepatic steatosis. There is a solitary 1.1 x 0.9 cm anterior segment 2 left liver mass (series 7/image 10 and best seen on diffusion sequence series 5/image 10), worrisome for liver metastasis. No additional liver lesions. Mild gallbladder distention. Moderate diffuse gallbladder wall thickening. Minimal pericholecystic fluid and fat stranding. Small amount of layering sludge in the gallbladder. No gallstones. Moderate diffuse intrahepatic biliary ductal dilatation. Dilated proximal common bile duct with diameter 13 mm. Abrupt malignant-appearing biliary stricture at the level of the middle third of  the common bile duct with diminutive caliber of the lower third of the common bile duct (1 mm diameter). No choledocholithiasis. There is a mildly enlarged peripancreatic/porta hepatis 1.1 cm pathologic lymph node abutting the site of the common bile duct stricture (series 3/image 16). Pancreas: No pancreatic mass or duct dilation. No peripancreatic fluid collections. Unable to confidently assess for pancreas divisum given motion degradation on the MRCP sequences. Spleen: Normal size. No mass. Adrenals/Urinary Tract: Normal adrenals. No hydronephrosis. Small simple bilateral renal cysts, largest 3.5 cm in the interpolar right kidney, for which no follow-up imaging is recommended. Stomach/Bowel: Normal non-distended stomach. Visualized small and large bowel is normal caliber, with no bowel wall thickening. Partially visualized postsurgical changes from partial left colectomy. Vascular/Lymphatic: Normal caliber abdominal aorta. Multiple mildly enlarged porta hepatis nodes up to 1.9 cm (series 5/image 16). Mildly enlarged 1.0 cm portacaval node (series 5/image 22). Other: No abdominal ascites or focal fluid collection. Musculoskeletal: No aggressive appearing focal osseous lesions. IMPRESSION: 1. Abrupt malignant-appearing biliary stricture at the level  of the middle third of the common bile duct, with adjacent porta hepatis adenopathy. Moderate diffuse intrahepatic and proximal extrahepatic biliary ductal dilatation. No choledocholithiasis. 2. Mild gallbladder distention with moderate diffuse gallbladder wall thickening. Minimal pericholecystic fluid and fat stranding. Small amount of layering sludge in the gallbladder. No gallstones. Findings are nonspecific, with differential including acute cholecystitis versus reactive changes to biliary obstruction. 3. Solitary 1.1 cm anterior segment 2 left liver mass, worrisome for liver metastasis. 4. Multiple mildly enlarged porta hepatis and portacaval nodes, suspicious for  metastatic adenopathy. 5. Trace posterior right pleural effusion. Electronically Signed   By: Delbert Phenix M.D.   On: 05/08/2023 09:03   MR 3D Recon At Scanner Result Date: 05/08/2023 CLINICAL DATA:  Inpatient. Jaundice. Chronic kidney disease. Metastatic colon cancer. EXAM: MRI ABDOMEN WITHOUT CONTRAST  (INCLUDING MRCP) TECHNIQUE: Multiplanar multisequence MR imaging of the abdomen was performed. Heavily T2-weighted images of the biliary and pancreatic ducts were obtained, and three-dimensional MRCP images were rendered by post processing. COMPARISON:  05/07/2023 CT abdomen/pelvis. FINDINGS: Lower chest: Trace posterior right pleural effusion. Hepatobiliary: Normal liver size and configuration. No hepatic steatosis. There is a solitary 1.1 x 0.9 cm anterior segment 2 left liver mass (series 7/image 10 and best seen on diffusion sequence series 5/image 10), worrisome for liver metastasis. No additional liver lesions. Mild gallbladder distention. Moderate diffuse gallbladder wall thickening. Minimal pericholecystic fluid and fat stranding. Small amount of layering sludge in the gallbladder. No gallstones. Moderate diffuse intrahepatic biliary ductal dilatation. Dilated proximal common bile duct with diameter 13 mm. Abrupt malignant-appearing biliary stricture at the level of the middle third of the common bile duct with diminutive caliber of the lower third of the common bile duct (1 mm diameter). No choledocholithiasis. There is a mildly enlarged peripancreatic/porta hepatis 1.1 cm pathologic lymph node abutting the site of the common bile duct stricture (series 3/image 16). Pancreas: No pancreatic mass or duct dilation. No peripancreatic fluid collections. Unable to confidently assess for pancreas divisum given motion degradation on the MRCP sequences. Spleen: Normal size. No mass. Adrenals/Urinary Tract: Normal adrenals. No hydronephrosis. Small simple bilateral renal cysts, largest 3.5 cm in the interpolar  right kidney, for which no follow-up imaging is recommended. Stomach/Bowel: Normal non-distended stomach. Visualized small and large bowel is normal caliber, with no bowel wall thickening. Partially visualized postsurgical changes from partial left colectomy. Vascular/Lymphatic: Normal caliber abdominal aorta. Multiple mildly enlarged porta hepatis nodes up to 1.9 cm (series 5/image 16). Mildly enlarged 1.0 cm portacaval node (series 5/image 22). Other: No abdominal ascites or focal fluid collection. Musculoskeletal: No aggressive appearing focal osseous lesions. IMPRESSION: 1. Abrupt malignant-appearing biliary stricture at the level of the middle third of the common bile duct, with adjacent porta hepatis adenopathy. Moderate diffuse intrahepatic and proximal extrahepatic biliary ductal dilatation. No choledocholithiasis. 2. Mild gallbladder distention with moderate diffuse gallbladder wall thickening. Minimal pericholecystic fluid and fat stranding. Small amount of layering sludge in the gallbladder. No gallstones. Findings are nonspecific, with differential including acute cholecystitis versus reactive changes to biliary obstruction. 3. Solitary 1.1 cm anterior segment 2 left liver mass, worrisome for liver metastasis. 4. Multiple mildly enlarged porta hepatis and portacaval nodes, suspicious for metastatic adenopathy. 5. Trace posterior right pleural effusion. Electronically Signed   By: Delbert Phenix M.D.   On: 05/08/2023 09:03   CT ABDOMEN PELVIS WO CONTRAST Result Date: 05/07/2023 CLINICAL DATA:  History of colon cancer, metastatic. No which on this and worsening renal failure. * Tracking Code: BO *  EXAM: CT ABDOMEN AND PELVIS WITHOUT CONTRAST TECHNIQUE: Multidetector CT imaging of the abdomen and pelvis was performed following the standard protocol without IV contrast. RADIATION DOSE REDUCTION: This exam was performed according to the departmental dose-optimization program which includes automated  exposure control, adjustment of the mA and/or kV according to patient size and/or use of iterative reconstruction technique. COMPARISON:  03/05/2023 FINDINGS: Lower chest: Dependent atelectasis in the lung bases. Hepatobiliary: Intrahepatic biliary duct dilatation is new in the interval. There may be some associated periportal edema. Portal venous anatomy is prominent and ill-defined. Density in the portal vein is similar to background blood pool density in the aorta. Common duct is dilated up to 15 mm diameter and appears to abruptly terminate prior to entering the head of the pancreas. Gallbladder is distended with ill definition of the gallbladder wall and potential pericholecystic edema/fluid. Pancreas: No focal mass lesion. No dilatation of the main duct. No intraparenchymal cyst. No peripancreatic edema. Spleen: No splenomegaly. No suspicious focal mass lesion. Adrenals/Urinary Tract: No adrenal nodule or mass. Stable right renal cyst. No followup imaging is recommended. Left kidney unremarkable on noncontrast imaging. No hydroureteronephrosis. Bladder is decompressed. Stomach/Bowel: Mild circumferential wall thickening noted distal esophagus. Stomach is unremarkable. No gastric wall thickening. No evidence of outlet obstruction. Duodenum is normally positioned as is the ligament of Treitz. No small bowel wall thickening. No small bowel dilatation. The terminal ileum is normal. The appendix is normal. Status post left hemicolectomy with reanastomosis. Vascular/Lymphatic: No abdominal aortic aneurysm. No abdominal aortic atherosclerotic calcification. Small lymph nodes in the gastrohepatic ligament (image 21/2) are similar to prior. Small lymph nodes also noted hepatoduodenal ligament. Index 14 mm short axis hepatoduodenal ligament node measured previously is 16 mm on 27/2 today. Index left external iliac lymph node measured previously at 14 mm short axis is 14 mm again today on 76/2. Other small lymph nodes  along the left pelvic sidewall again noted. Reproductive: The prostate gland and seminal vesicles are unremarkable. Other: No intraperitoneal free fluid. Musculoskeletal: Small left groin hernia contains only fat. No worrisome lytic or sclerotic osseous abnormality. IMPRESSION: 1. New intrahepatic and extrahepatic biliary duct dilatation with possible associated periportal edema. Common duct is dilated up to 15 mm diameter and appears to abruptly terminate prior to entering the head of the pancreas. Imaging features are compatible with biliary obstruction, potentially secondary to the hepatoduodenal ligament lymphadenopathy seen previously. Assessment is limited due to lack of intravenous contrast material. 2. Distended gallbladder with possible gallbladder wall thickening and pericholecystic edema. Right upper quadrant ultrasound recommended to evaluate for acute cholecystitis. 3. No substantial change in lymphadenopathy of the gastrohepatic ligament, hepatoduodenal ligament, and left external iliac chain. 4. Tiny hypoattenuating foci seen previously in the dome of the left liver are not evident on the current exam. 5. Mild circumferential wall thickening distal esophagus. Esophagitis would be a consideration. 6. Small left groin hernia contains only fat. Electronically Signed   By: Kennith Center M.D.   On: 05/07/2023 14:04   Mr Kehres was interviewed and examined.  He appears stable.  He continues to have pruritus.  The liver enzymes and bilirubin remain markedly elevated.  A biopsy of the portal lymph node returned positive for adenocarcinoma consistent with metastatic colon cancer.  Mr. Radhakrishnan has metastatic colon cancer.  He is not a candidate for systemic chemotherapy until the biliary obstruction is relieved.  Systemic treatment options will also be limited by renal failure.  Recommendations: Consult gastroenterology to evaluate the bile duct  stent and possible need for placement of another  stent Please call Oncology as needed I will check on him 05/17/2023 if he remains in the hospital Outpatient follow-up will be scheduled at the Cancer center

## 2023-05-16 DIAGNOSIS — C189 Malignant neoplasm of colon, unspecified: Secondary | ICD-10-CM | POA: Diagnosis not present

## 2023-05-16 DIAGNOSIS — D509 Iron deficiency anemia, unspecified: Secondary | ICD-10-CM | POA: Diagnosis not present

## 2023-05-16 DIAGNOSIS — K831 Obstruction of bile duct: Secondary | ICD-10-CM | POA: Diagnosis not present

## 2023-05-16 DIAGNOSIS — C801 Malignant (primary) neoplasm, unspecified: Secondary | ICD-10-CM | POA: Diagnosis not present

## 2023-05-16 LAB — COMPREHENSIVE METABOLIC PANEL
ALT: 154 U/L — ABNORMAL HIGH (ref 0–44)
AST: 167 U/L — ABNORMAL HIGH (ref 15–41)
Albumin: 1.9 g/dL — ABNORMAL LOW (ref 3.5–5.0)
Alkaline Phosphatase: 1060 U/L — ABNORMAL HIGH (ref 38–126)
Anion gap: 7 (ref 5–15)
BUN: 45 mg/dL — ABNORMAL HIGH (ref 6–20)
CO2: 15 mmol/L — ABNORMAL LOW (ref 22–32)
Calcium: 7.7 mg/dL — ABNORMAL LOW (ref 8.9–10.3)
Chloride: 106 mmol/L (ref 98–111)
Creatinine, Ser: 4.37 mg/dL — ABNORMAL HIGH (ref 0.61–1.24)
GFR, Estimated: 15 mL/min — ABNORMAL LOW (ref >60)
Glucose, Bld: 133 mg/dL — ABNORMAL HIGH (ref 70–99)
Potassium: 3.9 mmol/L (ref 3.5–5.1)
Sodium: 128 mmol/L — ABNORMAL LOW (ref 135–145)
Total Bilirubin: 12.6 mg/dL — ABNORMAL HIGH (ref <1.2)
Total Protein: 5.5 g/dL — ABNORMAL LOW (ref 6.5–8.1)

## 2023-05-16 LAB — GLUCOSE, CAPILLARY
Glucose-Capillary: 93 mg/dL (ref 70–99)
Glucose-Capillary: 139 mg/dL — ABNORMAL HIGH (ref 70–99)
Glucose-Capillary: 129 mg/dL — ABNORMAL HIGH (ref 70–99)
Glucose-Capillary: 142 mg/dL — ABNORMAL HIGH (ref 70–99)

## 2023-05-16 NOTE — Progress Notes (Signed)
[facility] Progress Note  Patient: Jeffrey Young     ZOX:[id number]  DOA: 05/07/2023   PCP: Tracey Harries, MD       Brief hospital course: This is a 57 year old man with stage III colon cancer who is currently on surveillance, diabetes mellitus, wound on left foot, chronic kidney disease, hypertension who presents to the hospital for bloody urine.  There was no gross hematuria and a UA was negative for blood.  He was noted to be jaundiced.  Blood work revealed elevated LFTs.  Creatinine was also noted to be elevated from a baseline of 3.53 to a level of 5.02. Imaging suggested possible cholecystitis.  Patient admitted for further management.  Subjective:  Patient denies any new complaints.  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 as primary LFTs rising, MRCP showed possible biliary stent migration or occlusion GI on board, plan for repeat ERCP with possible stent exchange on 05/18/2023  ARF on CKD 4 Metabolic acidosis Cr improved from 5.02-->4.3, s/p IVF CT does not reveal any renal abnormalities Monitor BMP for possible restarting IV fluid If creatinine continues to worsen, will consult nephrology  Hyponatremia Daily BMP  Normocytic anemia Anemia of chronic disease/malignancy Received 1 units of packed RBC for hemoglobin of 6.9 on 12/17 Daily CBC  Hypertension Continue carvedilol and Amlodipine   Diabetes mellitus, controlled Cont Lantus at 10 U BID, sliding scale Hemoglobin A1c is well-controlled at 5.4  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 Currently being monitored for recurrence every 6 weeks by his oncologist Dr Truett Perna on board  Wound on left foot Managed as outpatient by  podiatry Appreciate wound care consult-continue dressing changes as recommended      Code Status: Full Code  DVT prophylaxis:  SCDs Start: 05/08/23 0225    Objective:   Vitals:   05/15/23 0425 05/15/23 1131 05/15/23 1948 05/16/23 0448  BP: (!) 147/73 (!) 154/87 (!) 145/81 (!) 147/85  Pulse: 66 65 66 69  Resp: 18 18 18 18   Temp: 98 F (36.7 C) 98 F (36.7 C) 98.1 F (36.7 C) 98.2 F (36.8 C)  TempSrc: Oral Oral Oral Oral  SpO2: 99% 98% 97% 97%  Weight:      Height:       Filed Weights   05/07/23 0933 05/10/23 1115  Weight: 87 kg 87 kg   Exam: General: NAD  Cardiovascular: S1, S2 present Respiratory: CTAB Abdomen: Soft, nontender, nondistended, bowel sounds present Musculoskeletal: No bilateral pedal edema noted Skin: Normal Psychiatry: Normal mood    CBC: Recent Labs  Lab 05/10/23 0406 05/11/23 0318 05/11/23 1707 05/12/23 0304 05/15/23 0112  WBC 7.9 8.0 9.0 8.2 6.9  NEUTROABS  --   --  6.8  --   --   HGB 8.2* 8.4* 8.0* 7.9* 8.0*  HCT 23.2* 25.3* 23.1* 23.4* 25.0*  MCV 88.9 92.0 90.9 92.1 95.1  PLT 301 304 292 280 238   Basic Metabolic Panel: Recent Labs  Lab 05/12/23 0304 05/13/23 0416 05/14/23 0217 05/15/23 0112 05/16/23 0316  NA 129* 130* 133* 131* 128*  K 3.6 3.6 4.0 4.0 3.9  CL 103 106 109 108 106  CO2 18* 16* 15* 16* 15*  GLUCOSE 133* 158* 101* 119* 133*  BUN 50* 48* 43* 44* 45*  CREATININE  4.84* 4.71* 4.23* 4.30* 4.37*  CALCIUM 7.8* 7.4* 7.7* 8.0* 7.7*     Scheduled Meds:  amLODipine  5 mg Oral Daily   carvedilol  12.5 mg Oral BID WC   Chlorhexidine Gluconate Cloth  6 each Topical Daily   insulin aspart  0-6 Units Subcutaneous TID WC   insulin glargine-yfgn  10 Units Subcutaneous BID   multivitamin with minerals  1 tablet Oral Daily   pantoprazole  40 mg Oral Daily   sodium chloride flush  10-40 mL Intracatheter Q12H    Imaging and lab data personally reviewed   Author: Briant Cedar  05/16/2023 1:24 PM  To contact  [facility]>   Check the care team in [facility] and look for the attending/consulting [facility] provider listed  Log into [email or web address] and use [facility] universal password   Go to> "[facility]"  and find provider  If you still have difficulty reaching the provider, please page the [facility] (Director on Call) for the Hospitalists listed on amion

## 2023-05-16 NOTE — Progress Notes (Signed)
GI Progress Note covering for Drs. Mann & Hung   Assessment    Obstructive jaundice likely secondary to metastatic lymphadenopathy. MRCP reviewed. Biliary stent has migrated or is occluded. 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 Elnoria Howard Dr. Loreta Ave will resume GI care tomorrow   Chief Complaint   No complaints today  Vital signs in last 24 hours: Temp:  [98 F (36.7 C)-98.2 F (36.8 C)] 98.2 F (36.8 C) (12/25 0448) Pulse Rate:  [65-69] 69 (12/25 0448) Resp:  [18] 18 (12/25 0448) BP: (145-154)/(81-87) 147/85 (12/25 0448) SpO2:  [97 %-98 %] 97 % (12/25 0448) Last BM Date : 05/13/23  General: Alert, well-developed, in NAD Heart:  Regular rate and rhythm; no murmurs Chest: Clear to ascultation bilaterally Abdomen:  Soft, nontender and nondistended. Normal bowel sounds, without guarding, and without rebound.   Extremities:  Without edema. Neurologic:  Alert and  oriented x4; grossly normal neurologically. Psych:  Alert and cooperative. Normal mood and affect.  Intake/Output from previous day: 12/24 0701 - 12/25 0700 In: 240 [P.O.:240] Out: 650 [Urine:650] Intake/Output this shift: No intake/output data recorded.  Lab Results: Recent Labs    05/15/23 0112  WBC 6.9  HGB 8.0*  HCT 25.0*  PLT 238   BMET Recent Labs    05/14/23 0217 05/15/23 0112 05/16/23 0316  NA 133* 131* 128*  K 4.0 4.0 3.9  CL 109 108 106  CO2 15* 16* 15*  GLUCOSE 101* 119* 133*  BUN 43* 44* 45*  CREATININE 4.23* 4.30* 4.37*  CALCIUM 7.7* 8.0* 7.7*    LFT Recent Labs    05/16/23 0316  PROT 5.5*  ALBUMIN 1.9*  AST 167*  ALT 154*  ALKPHOS 1,060*  BILITOT 12.6*    Studies/Results: MR ABDOMEN MRCP W WO CONTAST Result Date: 05/15/2023 CLINICAL DATA:  Obstructive jaundice due to malignant neoplasm. Recent ERCP with biliary stent placement. EXAM: MRI ABDOMEN WITHOUT AND WITH CONTRAST (INCLUDING MRCP) TECHNIQUE: Multiplanar multisequence MR imaging of the abdomen was performed both before and after the administration of intravenous contrast. Heavily T2-weighted images of the biliary and pancreatic ducts were obtained, and three-dimensional MRCP images were rendered by post processing. CONTRAST:  8mL GADAVIST GADOBUTROL 1 MMOL/ML IV SOLN COMPARISON:  MRCP 05/08/2023, abdominopelvic CT 05/07/2023, ultrasound 05/14/2023 and images from ERCP 05/10/2023. FINDINGS: Technical note: Despite efforts by the technologist and patient, moderate motion artifact is present on today's exam and could not be eliminated. This reduces exam sensitivity and specificity. Lower chest: New small bilateral pleural effusions with associated dependent atelectasis at both lung bases. Hepatobiliary: The liver as a non cirrhotic morphology, without significant steatosis. Although assessment is mildly limited by the motion artifact, no focal liver lesions are identified on today's study to suggest metastatic disease. There is persistent at least moderate intra and extrahepatic biliary dilatation, similar to the previous MRI. The common bile duct measures up to 1.5 cm in diameter. There is an abrupt transition in duct caliber with shelving above the pancreatic head, consistent with known malignant biliary stricture. The biliary stent  is not well visualized (questionably seen on image 20 of series 11). Cannot exclude stent migration given the persistent biliary dilatation. No well-defined mass is identified, although there is an approximately 1.6 cm area of restricted diffusion and  decreased enhancement at the level of the biliary obstruction (image 47/15). Mild gallbladder distension and wall thickening, slightly improved from previous MRI. Pancreas: No evidence of pancreatic mass, pancreatic ductal dilatation or surrounding inflammation. Spleen: Normal in size without focal abnormality. Adrenals/Urinary Tract: Both adrenal glands appear normal. No evidence of renal mass or hydronephrosis. Unchanged renal cysts bilaterally for which no specific follow-up imaging is recommended. Stomach/Bowel: The stomach appears unremarkable for its degree of distension. No evidence of bowel wall thickening, distention or surrounding inflammatory change. Vascular/Lymphatic: Again demonstrated are multiple enlarged peripancreatic and porta hepatis lymph nodes, measuring up to 1.8 cm short axis on image 18/6. These are most obvious on the diffusion-weighted images. The portal, superior mesenteric and splenic veins are patent without evidence of tumor encasement. No significant arterial abnormalities are seen. Other: Mild body wall edema without ascites or focal extraluminal fluid collection. Musculoskeletal: No acute or significant osseous findings. IMPRESSION: 1. Persistent at least moderate intra and extrahepatic biliary dilatation with abrupt transition in duct caliber above the pancreatic head, consistent with known malignant biliary stricture. The biliary stent is not well visualized. Cannot exclude stent migration given the persistent biliary dilatation. Suggest radiographic follow-up. 2. No well-defined mass is identified, although there is an approximately 1.6 cm area of restricted diffusion and decreased enhancement at the level of the biliary obstruction. 3. Persistent peripancreatic and porta hepatis adenopathy, consistent with metastatic disease. 4. No definite evidence of hepatic metastatic disease on today's motion limited examination. 5. New small bilateral pleural effusions with associated  dependent atelectasis at both lung bases. Electronically Signed   By: Carey Bullocks M.D.   On: 05/15/2023 14:32   MR 3D Recon At Scanner Result Date: 05/15/2023 CLINICAL DATA:  Obstructive jaundice due to malignant neoplasm. Recent ERCP with biliary stent placement. EXAM: MRI ABDOMEN WITHOUT AND WITH CONTRAST (INCLUDING MRCP) TECHNIQUE: Multiplanar multisequence MR imaging of the abdomen was performed both before and after the administration of intravenous contrast. Heavily T2-weighted images of the biliary and pancreatic ducts were obtained, and three-dimensional MRCP images were rendered by post processing. CONTRAST:  8mL GADAVIST GADOBUTROL 1 MMOL/ML IV SOLN COMPARISON:  MRCP 05/08/2023, abdominopelvic CT 05/07/2023, ultrasound 05/14/2023 and images from ERCP 05/10/2023. FINDINGS: Technical note: Despite efforts by the technologist and patient, moderate motion artifact is present on today's exam and could not be eliminated. This reduces exam sensitivity and specificity. Lower chest: New small bilateral pleural effusions with associated dependent atelectasis at both lung bases. Hepatobiliary: The liver as a non cirrhotic morphology, without significant steatosis. Although assessment is mildly limited by the motion artifact, no focal liver lesions are identified on today's study to suggest metastatic disease. There is persistent at least moderate intra and extrahepatic biliary dilatation, similar to the previous MRI. The common bile duct measures up to 1.5 cm in diameter. There is an abrupt transition in duct caliber with shelving above the pancreatic head, consistent with known malignant biliary stricture. The biliary stent is not well visualized (questionably seen on image 20 of series 11). Cannot exclude stent migration given the persistent biliary dilatation. No well-defined mass is identified, although there is an approximately 1.6 cm area of restricted diffusion and decreased enhancement at the level of  the biliary obstruction (image 47/15). Mild gallbladder distension and wall  thickening, slightly improved from previous MRI. Pancreas: No evidence of pancreatic mass, pancreatic ductal dilatation or surrounding inflammation. Spleen: Normal in size without focal abnormality. Adrenals/Urinary Tract: Both adrenal glands appear normal. No evidence of renal mass or hydronephrosis. Unchanged renal cysts bilaterally for which no specific follow-up imaging is recommended. Stomach/Bowel: The stomach appears unremarkable for its degree of distension. No evidence of bowel wall thickening, distention or surrounding inflammatory change. Vascular/Lymphatic: Again demonstrated are multiple enlarged peripancreatic and porta hepatis lymph nodes, measuring up to 1.8 cm short axis on image 18/6. These are most obvious on the diffusion-weighted images. The portal, superior mesenteric and splenic veins are patent without evidence of tumor encasement. No significant arterial abnormalities are seen. Other: Mild body wall edema without ascites or focal extraluminal fluid collection. Musculoskeletal: No acute or significant osseous findings. IMPRESSION: 1. Persistent at least moderate intra and extrahepatic biliary dilatation with abrupt transition in duct caliber above the pancreatic head, consistent with known malignant biliary stricture. The biliary stent is not well visualized. Cannot exclude stent migration given the persistent biliary dilatation. Suggest radiographic follow-up. 2. No well-defined mass is identified, although there is an approximately 1.6 cm area of restricted diffusion and decreased enhancement at the level of the biliary obstruction. 3. Persistent peripancreatic and porta hepatis adenopathy, consistent with metastatic disease. 4. No definite evidence of hepatic metastatic disease on today's motion limited examination. 5. New small bilateral pleural effusions with associated dependent atelectasis at both lung bases.  Electronically Signed   By: Carey Bullocks M.D.   On: 05/15/2023 14:32   US Abdomen Limited RUQ (LIVER/GB) Result Date: 05/14/2023 CLINICAL DATA:  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  CT 05/07/2023.  MRI 05/08/2023 FINDINGS: Gallbladder: Gallbladder is somewhat contracted and filled with heterogeneous material. This is likely combination of stones and sludge. There is progression since the previous study. No gallbladder wall thickening or pericholecystic edema. Murphy's sign is negative. Common bile duct: Diameter: 14 mm, dilated. Liver: Intrahepatic bile duct dilatation is demonstrated. Focal liver lesion seen at previous imaging studies are not demonstrated on the current examination. Portal vein is patent on color Doppler imaging with normal direction of blood flow towards the liver. Other: None. IMPRESSION: 1. Somewhat contracted gallbladder filled with heterogeneous material likely representing stones and sludge. This is progressed since the prior study. 2. Intra and extrahepatic bile duct dilatation consistent with changes due to known biliary malignancy. Electronically Signed   By: Burman Nieves M.D.   On: 05/14/2023 23:10      LOS: 9 days   Ferrel Simington T. Russella Dar, MD 05/16/2023, 9:48 AM See Loretha Stapler, [facility], to contact our on call provider

## 2023-05-16 NOTE — Plan of Care (Signed)

## 2023-05-17 DIAGNOSIS — C801 Malignant (primary) neoplasm, unspecified: Secondary | ICD-10-CM | POA: Diagnosis not present

## 2023-05-17 DIAGNOSIS — K831 Obstruction of bile duct: Secondary | ICD-10-CM | POA: Diagnosis not present

## 2023-05-17 LAB — CBC
HCT: 21.4 % — ABNORMAL LOW (ref 39.0–52.0)
Hemoglobin: 7.1 g/dL — ABNORMAL LOW (ref 13.0–17.0)
MCH: 31.3 pg (ref 26.0–34.0)
MCHC: 33.2 g/dL (ref 30.0–36.0)
MCV: 94.3 fL (ref 80.0–100.0)
Platelets: 189 10*3/uL (ref 150–400)
RBC: 2.27 MIL/uL — ABNORMAL LOW (ref 4.22–5.81)
RDW: 21.4 % — ABNORMAL HIGH (ref 11.5–15.5)
WBC: 6.5 10*3/uL (ref 4.0–10.5)
nRBC: 0 % (ref 0.0–0.2)

## 2023-05-17 LAB — COMPREHENSIVE METABOLIC PANEL
ALT: 149 U/L — ABNORMAL HIGH (ref 0–44)
AST: 151 U/L — ABNORMAL HIGH (ref 15–41)
Albumin: 2.1 g/dL — ABNORMAL LOW (ref 3.5–5.0)
Alkaline Phosphatase: 1068 U/L — ABNORMAL HIGH (ref 38–126)
Anion gap: 7 (ref 5–15)
BUN: 50 mg/dL — ABNORMAL HIGH (ref 6–20)
CO2: 14 mmol/L — ABNORMAL LOW (ref 22–32)
Calcium: 7.9 mg/dL — ABNORMAL LOW (ref 8.9–10.3)
Chloride: 107 mmol/L (ref 98–111)
Creatinine, Ser: 4.37 mg/dL — ABNORMAL HIGH (ref 0.61–1.24)
GFR, Estimated: 15 mL/min — ABNORMAL LOW (ref >60)
Glucose, Bld: 94 mg/dL (ref 70–99)
Potassium: 4 mmol/L (ref 3.5–5.1)
Sodium: 128 mmol/L — ABNORMAL LOW (ref 135–145)
Total Bilirubin: 14.5 mg/dL — ABNORMAL HIGH (ref <1.2)
Total Protein: 5.5 g/dL — ABNORMAL LOW (ref 6.5–8.1)

## 2023-05-17 LAB — GLUCOSE, CAPILLARY
Glucose-Capillary: 71 mg/dL (ref 70–99)
Glucose-Capillary: 89 mg/dL (ref 70–99)
Glucose-Capillary: 100 mg/dL — ABNORMAL HIGH (ref 70–99)
Glucose-Capillary: 125 mg/dL — ABNORMAL HIGH (ref 70–99)

## 2023-05-17 NOTE — Progress Notes (Signed)
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 There is no history of fever or chills  Objective: Vital signs in last 24 hours: Temp:  [97.7 F (36.5 C)-98.8 F (37.1 C)] 98.8 F (37.1 C) (12/26 0334) Pulse Rate:  [60-70] 65 (12/26 0334) Resp:  [16-20] 19 (12/26 0334) BP: (136-157)/(76-86) 154/84 (12/26 0334) SpO2:  [97 %-99 %] 97 % (12/26 0334) Last BM Date : 05/16/23  Intake/Output from previous day: 12/25 0701 - 12/26 0700 In: 240 [P.O.:240] Out: 1700 [Urine:1700] Intake/Output this shift: No intake/output data recorded.  General appearance: alert, cooperative, appears stated age, icteric, and no distress Resp: clear to auscultation bilaterally Cardio: regular rate and rhythm, S1, S2 normal, no murmur, click, rub or gallop GI: soft, non-tender; bowel sounds normal; no masses,  no organomegaly  Lab Results: Recent Labs    05/15/23 0112 05/17/23 0256  WBC 6.9 6.5  HGB 8.0* 7.1*  HCT 25.0* 21.4*  PLT 238 189   BMET Recent Labs    05/15/23 0112 05/16/23 0316 05/17/23 0256  NA 131* 128* 128*  K 4.0 3.9 4.0  CL 108 106 107  CO2 16* 15* 14*  GLUCOSE 119* 133* 94  BUN 44* 45* 50*  CREATININE 4.30* 4.37* 4.37*  CALCIUM 8.0* 7.7* 7.9*   LFT Recent Labs    05/17/23 0256  PROT 5.5*  ALBUMIN 2.1*  AST 151*  ALT 149*  ALKPHOS 1,068*  BILITOT 14.5*   Studies/Results: MR ABDOMEN MRCP W WO CONTAST Result Date: 05/15/2023 CLINICAL DATA:  Obstructive jaundice due to malignant neoplasm. Recent ERCP with biliary stent placement. EXAM: MRI ABDOMEN WITHOUT AND WITH CONTRAST (INCLUDING MRCP) TECHNIQUE: Multiplanar multisequence MR imaging of the abdomen was performed both before and after the administration of intravenous contrast. Heavily T2-weighted images of the biliary and pancreatic ducts were obtained, and three-dimensional MRCP images were rendered by post  processing. CONTRAST:  8mL GADAVIST GADOBUTROL 1 MMOL/ML IV SOLN COMPARISON:  MRCP 05/08/2023, abdominopelvic CT 05/07/2023, ultrasound 05/14/2023 and images from ERCP 05/10/2023. FINDINGS: Technical note: Despite efforts by the technologist and patient, moderate motion artifact is present on today's exam and could not be eliminated. This reduces exam sensitivity and specificity. Lower chest: New small bilateral pleural effusions with associated dependent atelectasis at both lung bases. Hepatobiliary: The liver as a non cirrhotic morphology, without significant steatosis. Although assessment is mildly limited by the motion artifact, no focal liver lesions are identified on today's study to suggest metastatic disease. There is persistent at least moderate intra and extrahepatic biliary dilatation, similar to the previous MRI. The common bile duct measures up to 1.5 cm in diameter. There is an abrupt transition in duct caliber with shelving above the pancreatic head, consistent with known malignant biliary stricture. The biliary stent is not well visualized (questionably seen on image 20 of series 11). Cannot exclude stent migration given the persistent biliary dilatation. No well-defined mass is identified, although there is an approximately 1.6 cm area of restricted diffusion and decreased enhancement at the level of the biliary obstruction (image 47/15). Mild gallbladder distension and wall thickening, slightly improved from previous MRI. Pancreas: No evidence of pancreatic mass, pancreatic ductal dilatation or surrounding inflammation. Spleen: Normal in size without focal abnormality. Adrenals/Urinary Tract: Both adrenal glands appear normal. No evidence of renal mass or hydronephrosis. Unchanged renal cysts bilaterally for which no specific follow-up imaging is recommended. Stomach/Bowel: The stomach appears unremarkable for its  degree of distension. No evidence of bowel wall thickening, distention or surrounding  inflammatory change. Vascular/Lymphatic: Again demonstrated are multiple enlarged peripancreatic and porta hepatis lymph nodes, measuring up to 1.8 cm short axis on image 18/6. These are most obvious on the diffusion-weighted images. The portal, superior mesenteric and splenic veins are patent without evidence of tumor encasement. No significant arterial abnormalities are seen. Other: Mild body wall edema without ascites or focal extraluminal fluid collection. Musculoskeletal: No acute or significant osseous findings. IMPRESSION: 1. Persistent at least moderate intra and extrahepatic biliary dilatation with abrupt transition in duct caliber above the pancreatic head, consistent with known malignant biliary stricture. The biliary stent is not well visualized. Cannot exclude stent migration given the persistent biliary dilatation. Suggest radiographic follow-up. 2. No well-defined mass is identified, although there is an approximately 1.6 cm area of restricted diffusion and decreased enhancement at the level of the biliary obstruction. 3. Persistent peripancreatic and porta hepatis adenopathy, consistent with metastatic disease. 4. No definite evidence of hepatic metastatic disease on today's motion limited examination. 5. New small bilateral pleural effusions with associated dependent atelectasis at both lung bases. Electronically Signed   By: Carey Bullocks M.D.   On: 05/15/2023 14:32   MR 3D Recon At Scanner Result Date: 05/15/2023 CLINICAL DATA:  Obstructive jaundice due to malignant neoplasm. Recent ERCP with biliary stent placement. EXAM: MRI ABDOMEN WITHOUT AND WITH CONTRAST (INCLUDING MRCP) TECHNIQUE: Multiplanar multisequence MR imaging of the abdomen was performed both before and after the administration of intravenous contrast. Heavily T2-weighted images of the biliary and pancreatic ducts were obtained, and three-dimensional MRCP images were rendered by post processing. CONTRAST:  8mL GADAVIST  GADOBUTROL 1 MMOL/ML IV SOLN COMPARISON:  MRCP 05/08/2023, abdominopelvic CT 05/07/2023, ultrasound 05/14/2023 and images from ERCP 05/10/2023. FINDINGS: Technical note: Despite efforts by the technologist and patient, moderate motion artifact is present on today's exam and could not be eliminated. This reduces exam sensitivity and specificity. Lower chest: New small bilateral pleural effusions with associated dependent atelectasis at both lung bases. Hepatobiliary: The liver as a non cirrhotic morphology, without significant steatosis. Although assessment is mildly limited by the motion artifact, no focal liver lesions are identified on today's study to suggest metastatic disease. There is persistent at least moderate intra and extrahepatic biliary dilatation, similar to the previous MRI. The common bile duct measures up to 1.5 cm in diameter. There is an abrupt transition in duct caliber with shelving above the pancreatic head, consistent with known malignant biliary stricture. The biliary stent is not well visualized (questionably seen on image 20 of series 11). Cannot exclude stent migration given the persistent biliary dilatation. No well-defined mass is identified, although there is an approximately 1.6 cm area of restricted diffusion and decreased enhancement at the level of the biliary obstruction (image 47/15). Mild gallbladder distension and wall thickening, slightly improved from previous MRI. Pancreas: No evidence of pancreatic mass, pancreatic ductal dilatation or surrounding inflammation. Spleen: Normal in size without focal abnormality. Adrenals/Urinary Tract: Both adrenal glands appear normal. No evidence of renal mass or hydronephrosis. Unchanged renal cysts bilaterally for which no specific follow-up imaging is recommended. Stomach/Bowel: The stomach appears unremarkable for its degree of distension. No evidence of bowel wall thickening, distention or surrounding inflammatory change.  Vascular/Lymphatic: Again demonstrated are multiple enlarged peripancreatic and porta hepatis lymph nodes, measuring up to 1.8 cm short axis on image 18/6. These are most obvious on the diffusion-weighted images. The portal, superior mesenteric and splenic veins are patent without  evidence of tumor encasement. No significant arterial abnormalities are seen. Other: Mild body wall edema without ascites or focal extraluminal fluid collection. Musculoskeletal: No acute or significant osseous findings. IMPRESSION: 1. Persistent at least moderate intra and extrahepatic biliary dilatation with abrupt transition in duct caliber above the pancreatic head, consistent with known malignant biliary stricture. The biliary stent is not well visualized. Cannot exclude stent migration given the persistent biliary dilatation. Suggest radiographic follow-up. 2. No well-defined mass is identified, although there is an approximately 1.6 cm area of restricted diffusion and decreased enhancement at the level of the biliary obstruction. 3. Persistent peripancreatic and porta hepatis adenopathy, consistent with metastatic disease. 4. No definite evidence of hepatic metastatic disease on today's motion limited examination. 5. New small bilateral pleural effusions with associated dependent atelectasis at both lung bases. Electronically Signed   By: Carey Bullocks M.D.   On: 05/15/2023 14:32    Medications: I have reviewed the patient's current medications. Prior to Admission:  Medications Prior to Admission  Medication Sig Dispense Refill Last Dose/Taking   amLODipine (NORVASC) 5 MG tablet Take 1 tablet (5 mg total) by mouth daily. 30 tablet 0 05/06/2023   atorvastatin (LIPITOR) 10 MG tablet Take 10 mg by mouth at bedtime.   05/06/2023 Bedtime   cadexomer iodine (IODOSORB) 0.9 % gel Apply 1 Application topically See admin instructions. Apply to the left foot every other day as a part of wound care   05/06/2023   carvedilol (COREG)  12.5 MG tablet Take 12.5 mg by mouth 2 (two) times daily with a meal.   05/06/2023 Bedtime   Continuous Glucose Sensor (DEXCOM G6 SENSOR) MISC Inject 1 Device into the skin See admin instructions. Place a new sensor into the skin every 10 days   Past Month   docusate sodium (COLACE) 100 MG capsule Take 100 mg by mouth daily as needed for mild constipation.   Taking As Needed   LANTUS SOLOSTAR 100 UNIT/ML Solostar Pen Inject 10-15 Units into the skin See admin instructions. Inject 15 units into the skin in the morning and 10 units at bedtime   05/06/2023   pantoprazole (PROTONIX) 40 MG tablet Take 40 mg by mouth 2 (two) times daily.   05/07/2023   sodium chloride irrigation 0.9 % irrigation Irrigate with 15 mLs as directed See admin instructions. IRRIGATE USING 15 ML'S ON AFFECTED AREA OF THE LEFT FOOT EVERY OTHER DAY- DRY SURROUNDING SKIN, THEN APPLY IODOSORB GEL   05/06/2023   ACCU-CHEK GUIDE test strip USE AS INSTRUCTED TO CHECK BLOOD SUGAR 2 TIMES DAILY      KLOR-CON M20 20 MEQ tablet TAKE 1 TABLET BY MOUTH EVERY DAY (Patient not taking: Reported on 05/07/2023) 90 tablet 0 Not Taking   ondansetron (ZOFRAN) 8 MG tablet Take 1 tablet (8 mg total) by mouth every 8 (eight) hours as needed for nausea or vomiting. Start 72 hours after IV chemotherapy treatment (Patient not taking: Reported on 05/07/2023) 30 tablet 1 Not Taking   prochlorperazine (COMPAZINE) 10 MG tablet Take 1 tablet (10 mg total) by mouth every 6 (six) hours as needed for nausea or vomiting. (Patient not taking: Reported on 05/07/2023) 60 tablet 1 Not Taking   Scheduled:  amLODipine  5 mg Oral Daily   carvedilol  12.5 mg Oral BID WC   Chlorhexidine Gluconate Cloth  6 each Topical Daily   insulin aspart  0-6 Units Subcutaneous TID WC   insulin glargine-yfgn  10 Units Subcutaneous BID   multivitamin with minerals  1 tablet Oral Daily   pantoprazole  40 mg Oral Daily   sodium chloride flush  10-40 mL Intracatheter Q12H    Continuous: ZOX:WRUE & mag hydroxide-simeth, hydrALAZINE, ondansetron (ZOFRAN) IV, sodium chloride flush  Assessment/Plan: 1) ?Biliary obstruction with elevated liver enzymes-ERCP with stent change planned for tomorrow. 2) Metastatic colon cancer. 3) ARF superimposed on CKD. 4) Hypertension. 5) AODM. 6) Hyponatremia. 7) Iron deficiency anemia. 8) Open wound on left foot.   LOS: 10 days   Charna Elizabeth 05/17/2023, 10:09 AM

## 2023-05-17 NOTE — Progress Notes (Signed)
Nutrition Follow-up  DOCUMENTATION CODES:   Not applicable  INTERVENTION:  - Recommend Regular diet.  - Patient declining to receive any supplements.  - Encourage intake at all meals.  - Continue Multivitamin with minerals daily - Monitor weight trends.   NUTRITION DIAGNOSIS:   Increased nutrient needs related to acute illness as evidenced by estimated needs. *ongoing  GOAL:   Patient will meet greater than or equal to 90% of their needs *progressing  MONITOR:   PO intake, Supplement acceptance, Weight trends  REASON FOR ASSESSMENT:   Consult Assessment of nutrition requirement/status  ASSESSMENT:   57 y.o.male with PMH significant for stage III colon cancer currently under surveillance for approximately last 2 years, type II DM, left foot wound, CKD stage IV,HTN who presented due to feeling sick, not urinating normally, urine had "pink blood". 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  Patient documented to be consuming 100% of meals. Not on supplements as he reports they give him GI issues. No new weight since last follow up to assess recent changes.   Medications reviewed and include: MVI  Labs reviewed:  Na 128 Creatinine 4.37 HA1C 5.4   Diet Order:   Diet Order             Diet heart healthy/carb modified Room service appropriate? Yes; Fluid consistency: Thin  Diet effective now                   EDUCATION NEEDS:  Education needs have been addressed  Skin:  Skin Assessment: Skin Integrity Issues: Skin Integrity Issues:: Diabetic Ulcer Diabetic Ulcer: Left foot  Last BM:  12/25  Height:  Ht Readings from Last 1 Encounters:  05/10/23 6\' 1"  (1.854 m)   Weight:  Wt Readings from Last 1 Encounters:  05/10/23 87 kg    BMI:  Body mass index is 25.3 kg/m.  Estimated Nutritional Needs:  Kcal:  4098-1191 kcals Protein:  105-120 grams Fluid:  >/= 2.1L    Shelle Iron RD,  LDN Contact via Secure Chat.

## 2023-05-17 NOTE — Plan of Care (Signed)
  Problem: Health Behavior/Discharge Planning: Goal: Ability to manage health-related needs will improve Outcome: Progressing   Problem: Clinical Measurements: Goal: Ability to maintain clinical measurements within normal limits will improve Outcome: Progressing Goal: Will remain free from infection Outcome: Progressing Goal: Diagnostic test results will improve Outcome: Progressing   

## 2023-05-17 NOTE — H&P (View-Only) (Signed)
 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 There is no history of fever or chills  Objective: Vital signs in last 24 hours: Temp:  [97.7 F (36.5 C)-98.8 F (37.1 C)] 98.8 F (37.1 C) (12/26 0334) Pulse Rate:  [60-70] 65 (12/26 0334) Resp:  [16-20] 19 (12/26 0334) BP: (136-157)/(76-86) 154/84 (12/26 0334) SpO2:  [97 %-99 %] 97 % (12/26 0334) Last BM Date : 05/16/23  Intake/Output from previous day: 12/25 0701 - 12/26 0700 In: 240 [P.O.:240] Out: 1700 [Urine:1700] Intake/Output this shift: No intake/output data recorded.  General appearance: alert, cooperative, appears stated age, icteric, and no distress Resp: clear to auscultation bilaterally Cardio: regular rate and rhythm, S1, S2 normal, no murmur, click, rub or gallop GI: soft, non-tender; bowel sounds normal; no masses,  no organomegaly  Lab Results: Recent Labs    05/15/23 0112 05/17/23 0256  WBC 6.9 6.5  HGB 8.0* 7.1*  HCT 25.0* 21.4*  PLT 238 189   BMET Recent Labs    05/15/23 0112 05/16/23 0316 05/17/23 0256  NA 131* 128* 128*  K 4.0 3.9 4.0  CL 108 106 107  CO2 16* 15* 14*  GLUCOSE 119* 133* 94  BUN 44* 45* 50*  CREATININE 4.30* 4.37* 4.37*  CALCIUM 8.0* 7.7* 7.9*   LFT Recent Labs    05/17/23 0256  PROT 5.5*  ALBUMIN 2.1*  AST 151*  ALT 149*  ALKPHOS 1,068*  BILITOT 14.5*   Studies/Results: MR ABDOMEN MRCP W WO CONTAST Result Date: 05/15/2023 CLINICAL DATA:  Obstructive jaundice due to malignant neoplasm. Recent ERCP with biliary stent placement. EXAM: MRI ABDOMEN WITHOUT AND WITH CONTRAST (INCLUDING MRCP) TECHNIQUE: Multiplanar multisequence MR imaging of the abdomen was performed both before and after the administration of intravenous contrast. Heavily T2-weighted images of the biliary and pancreatic ducts were obtained, and three-dimensional MRCP images were rendered by post  processing. CONTRAST:  8mL GADAVIST GADOBUTROL 1 MMOL/ML IV SOLN COMPARISON:  MRCP 05/08/2023, abdominopelvic CT 05/07/2023, ultrasound 05/14/2023 and images from ERCP 05/10/2023. FINDINGS: Technical note: Despite efforts by the technologist and patient, moderate motion artifact is present on today's exam and could not be eliminated. This reduces exam sensitivity and specificity. Lower chest: New small bilateral pleural effusions with associated dependent atelectasis at both lung bases. Hepatobiliary: The liver as a non cirrhotic morphology, without significant steatosis. Although assessment is mildly limited by the motion artifact, no focal liver lesions are identified on today's study to suggest metastatic disease. There is persistent at least moderate intra and extrahepatic biliary dilatation, similar to the previous MRI. The common bile duct measures up to 1.5 cm in diameter. There is an abrupt transition in duct caliber with shelving above the pancreatic head, consistent with known malignant biliary stricture. The biliary stent is not well visualized (questionably seen on image 20 of series 11). Cannot exclude stent migration given the persistent biliary dilatation. No well-defined mass is identified, although there is an approximately 1.6 cm area of restricted diffusion and decreased enhancement at the level of the biliary obstruction (image 47/15). Mild gallbladder distension and wall thickening, slightly improved from previous MRI. Pancreas: No evidence of pancreatic mass, pancreatic ductal dilatation or surrounding inflammation. Spleen: Normal in size without focal abnormality. Adrenals/Urinary Tract: Both adrenal glands appear normal. No evidence of renal mass or hydronephrosis. Unchanged renal cysts bilaterally for which no specific follow-up imaging is recommended. Stomach/Bowel: The stomach appears unremarkable for its  degree of distension. No evidence of bowel wall thickening, distention or surrounding  inflammatory change. Vascular/Lymphatic: Again demonstrated are multiple enlarged peripancreatic and porta hepatis lymph nodes, measuring up to 1.8 cm short axis on image 18/6. These are most obvious on the diffusion-weighted images. The portal, superior mesenteric and splenic veins are patent without evidence of tumor encasement. No significant arterial abnormalities are seen. Other: Mild body wall edema without ascites or focal extraluminal fluid collection. Musculoskeletal: No acute or significant osseous findings. IMPRESSION: 1. Persistent at least moderate intra and extrahepatic biliary dilatation with abrupt transition in duct caliber above the pancreatic head, consistent with known malignant biliary stricture. The biliary stent is not well visualized. Cannot exclude stent migration given the persistent biliary dilatation. Suggest radiographic follow-up. 2. No well-defined mass is identified, although there is an approximately 1.6 cm area of restricted diffusion and decreased enhancement at the level of the biliary obstruction. 3. Persistent peripancreatic and porta hepatis adenopathy, consistent with metastatic disease. 4. No definite evidence of hepatic metastatic disease on today's motion limited examination. 5. New small bilateral pleural effusions with associated dependent atelectasis at both lung bases. Electronically Signed   By: Carey Bullocks M.D.   On: 05/15/2023 14:32   MR 3D Recon At Scanner Result Date: 05/15/2023 CLINICAL DATA:  Obstructive jaundice due to malignant neoplasm. Recent ERCP with biliary stent placement. EXAM: MRI ABDOMEN WITHOUT AND WITH CONTRAST (INCLUDING MRCP) TECHNIQUE: Multiplanar multisequence MR imaging of the abdomen was performed both before and after the administration of intravenous contrast. Heavily T2-weighted images of the biliary and pancreatic ducts were obtained, and three-dimensional MRCP images were rendered by post processing. CONTRAST:  8mL GADAVIST  GADOBUTROL 1 MMOL/ML IV SOLN COMPARISON:  MRCP 05/08/2023, abdominopelvic CT 05/07/2023, ultrasound 05/14/2023 and images from ERCP 05/10/2023. FINDINGS: Technical note: Despite efforts by the technologist and patient, moderate motion artifact is present on today's exam and could not be eliminated. This reduces exam sensitivity and specificity. Lower chest: New small bilateral pleural effusions with associated dependent atelectasis at both lung bases. Hepatobiliary: The liver as a non cirrhotic morphology, without significant steatosis. Although assessment is mildly limited by the motion artifact, no focal liver lesions are identified on today's study to suggest metastatic disease. There is persistent at least moderate intra and extrahepatic biliary dilatation, similar to the previous MRI. The common bile duct measures up to 1.5 cm in diameter. There is an abrupt transition in duct caliber with shelving above the pancreatic head, consistent with known malignant biliary stricture. The biliary stent is not well visualized (questionably seen on image 20 of series 11). Cannot exclude stent migration given the persistent biliary dilatation. No well-defined mass is identified, although there is an approximately 1.6 cm area of restricted diffusion and decreased enhancement at the level of the biliary obstruction (image 47/15). Mild gallbladder distension and wall thickening, slightly improved from previous MRI. Pancreas: No evidence of pancreatic mass, pancreatic ductal dilatation or surrounding inflammation. Spleen: Normal in size without focal abnormality. Adrenals/Urinary Tract: Both adrenal glands appear normal. No evidence of renal mass or hydronephrosis. Unchanged renal cysts bilaterally for which no specific follow-up imaging is recommended. Stomach/Bowel: The stomach appears unremarkable for its degree of distension. No evidence of bowel wall thickening, distention or surrounding inflammatory change.  Vascular/Lymphatic: Again demonstrated are multiple enlarged peripancreatic and porta hepatis lymph nodes, measuring up to 1.8 cm short axis on image 18/6. These are most obvious on the diffusion-weighted images. The portal, superior mesenteric and splenic veins are patent without  evidence of tumor encasement. No significant arterial abnormalities are seen. Other: Mild body wall edema without ascites or focal extraluminal fluid collection. Musculoskeletal: No acute or significant osseous findings. IMPRESSION: 1. Persistent at least moderate intra and extrahepatic biliary dilatation with abrupt transition in duct caliber above the pancreatic head, consistent with known malignant biliary stricture. The biliary stent is not well visualized. Cannot exclude stent migration given the persistent biliary dilatation. Suggest radiographic follow-up. 2. No well-defined mass is identified, although there is an approximately 1.6 cm area of restricted diffusion and decreased enhancement at the level of the biliary obstruction. 3. Persistent peripancreatic and porta hepatis adenopathy, consistent with metastatic disease. 4. No definite evidence of hepatic metastatic disease on today's motion limited examination. 5. New small bilateral pleural effusions with associated dependent atelectasis at both lung bases. Electronically Signed   By: Carey Bullocks M.D.   On: 05/15/2023 14:32    Medications: I have reviewed the patient's current medications. Prior to Admission:  Medications Prior to Admission  Medication Sig Dispense Refill Last Dose/Taking   amLODipine (NORVASC) 5 MG tablet Take 1 tablet (5 mg total) by mouth daily. 30 tablet 0 05/06/2023   atorvastatin (LIPITOR) 10 MG tablet Take 10 mg by mouth at bedtime.   05/06/2023 Bedtime   cadexomer iodine (IODOSORB) 0.9 % gel Apply 1 Application topically See admin instructions. Apply to the left foot every other day as a part of wound care   05/06/2023   carvedilol (COREG)  12.5 MG tablet Take 12.5 mg by mouth 2 (two) times daily with a meal.   05/06/2023 Bedtime   Continuous Glucose Sensor (DEXCOM G6 SENSOR) MISC Inject 1 Device into the skin See admin instructions. Place a new sensor into the skin every 10 days   Past Month   docusate sodium (COLACE) 100 MG capsule Take 100 mg by mouth daily as needed for mild constipation.   Taking As Needed   LANTUS SOLOSTAR 100 UNIT/ML Solostar Pen Inject 10-15 Units into the skin See admin instructions. Inject 15 units into the skin in the morning and 10 units at bedtime   05/06/2023   pantoprazole (PROTONIX) 40 MG tablet Take 40 mg by mouth 2 (two) times daily.   05/07/2023   sodium chloride irrigation 0.9 % irrigation Irrigate with 15 mLs as directed See admin instructions. IRRIGATE USING 15 ML'S ON AFFECTED AREA OF THE LEFT FOOT EVERY OTHER DAY- DRY SURROUNDING SKIN, THEN APPLY IODOSORB GEL   05/06/2023   ACCU-CHEK GUIDE test strip USE AS INSTRUCTED TO CHECK BLOOD SUGAR 2 TIMES DAILY      KLOR-CON M20 20 MEQ tablet TAKE 1 TABLET BY MOUTH EVERY DAY (Patient not taking: Reported on 05/07/2023) 90 tablet 0 Not Taking   ondansetron (ZOFRAN) 8 MG tablet Take 1 tablet (8 mg total) by mouth every 8 (eight) hours as needed for nausea or vomiting. Start 72 hours after IV chemotherapy treatment (Patient not taking: Reported on 05/07/2023) 30 tablet 1 Not Taking   prochlorperazine (COMPAZINE) 10 MG tablet Take 1 tablet (10 mg total) by mouth every 6 (six) hours as needed for nausea or vomiting. (Patient not taking: Reported on 05/07/2023) 60 tablet 1 Not Taking   Scheduled:  amLODipine  5 mg Oral Daily   carvedilol  12.5 mg Oral BID WC   Chlorhexidine Gluconate Cloth  6 each Topical Daily   insulin aspart  0-6 Units Subcutaneous TID WC   insulin glargine-yfgn  10 Units Subcutaneous BID   multivitamin with minerals  1 tablet Oral Daily   pantoprazole  40 mg Oral Daily   sodium chloride flush  10-40 mL Intracatheter Q12H    Continuous: ZOX:WRUE & mag hydroxide-simeth, hydrALAZINE, ondansetron (ZOFRAN) IV, sodium chloride flush  Assessment/Plan: 1) ?Biliary obstruction with elevated liver enzymes-ERCP with stent change planned for tomorrow. 2) Metastatic colon cancer. 3) ARF superimposed on CKD. 4) Hypertension. 5) AODM. 6) Hyponatremia. 7) Iron deficiency anemia. 8) Open wound on left foot.   LOS: 10 days   Jeffrey Young 05/17/2023, 10:09 AM

## 2023-05-17 NOTE — Progress Notes (Signed)
[facility] Progress Note  Patient: Jeffrey Young     WJX:[id number]  DOA: 05/07/2023   PCP: Tracey Harries, MD       Brief hospital course: This is a 57 year old man with stage III colon cancer who is currently on surveillance, diabetes mellitus, wound on left foot, chronic kidney disease, hypertension who presents to the hospital for bloody urine.  There was no gross hematuria and a UA was negative for blood.  He was noted to be jaundiced.  Blood work revealed elevated LFTs.  Creatinine was also noted to be elevated from a baseline of 3.53 to a level of 5.02. Imaging suggested possible cholecystitis.  Patient admitted for further management.  Subjective:  Patient frustrated about multiple interruptions at night here in the hospital.  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 as primary LFTs rising, MRCP showed possible biliary stent migration or occlusion GI on board, plan for repeat ERCP with possible stent exchange on 05/18/2023  ARF on CKD 4 Metabolic acidosis Cr improved from 5.02-->4.3, s/p IVF CT does not reveal any renal abnormalities Monitor BMP for possible restarting IV fluid If creatinine continues to worsen, will consult nephrology  Hyponatremia ?Poor oral intake, noted to improve while on IV fluid Daily BMP  Normocytic anemia Anemia of chronic disease/malignancy Received 1 units of packed RBC for hemoglobin of 6.9 on 12/17 Daily CBC  Hypertension Continue carvedilol and Amlodipine   Diabetes mellitus, controlled Cont Lantus at 10 U BID, sliding scale Hemoglobin A1c is well-controlled at 5.4  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 Currently being monitored for recurrence every 6  weeks by his oncologist Dr Truett Perna on board  Wound on left foot Managed as outpatient by podiatry Appreciate wound care consult-continue dressing changes as recommended      Code Status: Full Code  DVT prophylaxis:  SCDs Start: 05/08/23 0225    Objective:   Vitals:   05/16/23 1720 05/16/23 2030 05/17/23 0334 05/17/23 1240  BP: (!) 154/86 (!) 157/86 (!) 154/84 128/67  Pulse: 70 70 65 61  Resp:  20 19 18   Temp:  97.9 F (36.6 C) 98.8 F (37.1 C) 97.9 F (36.6 C)  TempSrc:  Oral Oral Oral  SpO2:  98% 97% 99%  Weight:      Height:       Filed Weights   05/07/23 0933 05/10/23 1115  Weight: 87 kg 87 kg   Exam: General: NAD  Cardiovascular: S1, S2 present Respiratory: CTAB Abdomen: Soft, nontender, nondistended, bowel sounds present Musculoskeletal: No bilateral pedal edema noted Skin: Normal Psychiatry: Normal mood    CBC: Recent Labs  Lab 05/11/23 0318 05/11/23 1707 05/12/23 0304 05/15/23 0112 05/17/23 0256  WBC 8.0 9.0 8.2 6.9 6.5  NEUTROABS  --  6.8  --   --   --   HGB 8.4* 8.0* 7.9* 8.0* 7.1*  HCT 25.3* 23.1* 23.4* 25.0* 21.4*  MCV 92.0 90.9 92.1 95.1 94.3  PLT 304 292 280 238 189   Basic Metabolic Panel: Recent Labs  Lab 05/13/23 0416 05/14/23 0217 05/15/23 0112 05/16/23 0316 05/17/23 0256  NA 130* 133* 131* 128* 128*  K 3.6 4.0 4.0 3.9 4.0  CL 106 109 108 106 107  CO2 16* 15* 16* 15* 14*  GLUCOSE 158* 101*  119* 133* 94  BUN 48* 43* 44* 45* 50*  CREATININE 4.71* 4.23* 4.30* 4.37* 4.37*  CALCIUM 7.4* 7.7* 8.0* 7.7* 7.9*     Scheduled Meds:  amLODipine  5 mg Oral Daily   carvedilol  12.5 mg Oral BID WC   Chlorhexidine Gluconate Cloth  6 each Topical Daily   insulin aspart  0-6 Units Subcutaneous TID WC   insulin glargine-yfgn  10 Units Subcutaneous BID   multivitamin with minerals  1 tablet Oral Daily   pantoprazole  40 mg Oral Daily   sodium chloride flush  10-40 mL Intracatheter Q12H    Imaging and lab data personally reviewed    Author: Briant Cedar  05/17/2023 2:29 PM  To contact [facility]>   Check the care team in [facility] and look for the attending/consulting [facility] provider listed  Log into [email or web address] and use [place]'s universal password   Go to> "[facility]"  and find provider  If you still have difficulty reaching the provider, please page the [facility] (Director on Call) for the Hospitalists listed on amion

## 2023-05-18 ENCOUNTER — Inpatient Hospital Stay (HOSPITAL_COMMUNITY): Payer: Commercial Managed Care - PPO | Admitting: Anesthesiology

## 2023-05-18 ENCOUNTER — Other Ambulatory Visit: Payer: Self-pay | Admitting: *Deleted

## 2023-05-18 ENCOUNTER — Encounter (HOSPITAL_COMMUNITY): Payer: Self-pay | Admitting: Internal Medicine

## 2023-05-18 ENCOUNTER — Encounter (HOSPITAL_COMMUNITY)
Admission: EM | Disposition: A | Discharge status: Home / Self Care | Payer: Self-pay | Source: Home / Self Care | Attending: Internal Medicine

## 2023-05-18 ENCOUNTER — Inpatient Hospital Stay (HOSPITAL_COMMUNITY): Payer: Commercial Managed Care - PPO

## 2023-05-18 DIAGNOSIS — K631 Perforation of intestine (nontraumatic): Secondary | ICD-10-CM | POA: Diagnosis not present

## 2023-05-18 DIAGNOSIS — K831 Obstruction of bile duct: Secondary | ICD-10-CM | POA: Diagnosis not present

## 2023-05-18 DIAGNOSIS — C189 Malignant neoplasm of colon, unspecified: Secondary | ICD-10-CM

## 2023-05-18 DIAGNOSIS — C801 Malignant (primary) neoplasm, unspecified: Secondary | ICD-10-CM | POA: Diagnosis not present

## 2023-05-18 HISTORY — PX: SPHINCTEROTOMY: SHX5544

## 2023-05-18 HISTORY — PX: ERCP: SHX5425

## 2023-05-18 HISTORY — PX: STENT REMOVAL: SHX6421

## 2023-05-18 HISTORY — PX: BILIARY STENT PLACEMENT: SHX5538

## 2023-05-18 LAB — CBC
HCT: 20.6 % — ABNORMAL LOW (ref 39.0–52.0)
Hemoglobin: 6.7 g/dL — CL (ref 13.0–17.0)
MCH: 30.9 pg (ref 26.0–34.0)
MCHC: 32.5 g/dL (ref 30.0–36.0)
MCV: 94.9 fL (ref 80.0–100.0)
Platelets: 187 10*3/uL (ref 150–400)
RBC: 2.17 MIL/uL — ABNORMAL LOW (ref 4.22–5.81)
RDW: 22 % — ABNORMAL HIGH (ref 11.5–15.5)
WBC: 5.9 10*3/uL (ref 4.0–10.5)
nRBC: 0 % (ref 0.0–0.2)

## 2023-05-18 LAB — COMPREHENSIVE METABOLIC PANEL
ALT: 151 U/L — ABNORMAL HIGH (ref 0–44)
AST: 147 U/L — ABNORMAL HIGH (ref 15–41)
Albumin: 1.9 g/dL — ABNORMAL LOW (ref 3.5–5.0)
Alkaline Phosphatase: 1103 U/L — ABNORMAL HIGH (ref 38–126)
Anion gap: 9 (ref 5–15)
BUN: 49 mg/dL — ABNORMAL HIGH (ref 6–20)
CO2: 15 mmol/L — ABNORMAL LOW (ref 22–32)
Calcium: 8 mg/dL — ABNORMAL LOW (ref 8.9–10.3)
Chloride: 108 mmol/L (ref 98–111)
Creatinine, Ser: 4.46 mg/dL — ABNORMAL HIGH (ref 0.61–1.24)
GFR, Estimated: 14 mL/min — ABNORMAL LOW (ref >60)
Glucose, Bld: 130 mg/dL — ABNORMAL HIGH (ref 70–99)
Potassium: 4.1 mmol/L (ref 3.5–5.1)
Sodium: 132 mmol/L — ABNORMAL LOW (ref 135–145)
Total Bilirubin: 14.5 mg/dL — ABNORMAL HIGH (ref <1.2)
Total Protein: 5.5 g/dL — ABNORMAL LOW (ref 6.5–8.1)

## 2023-05-18 LAB — GLUCOSE, CAPILLARY
Glucose-Capillary: 119 mg/dL — ABNORMAL HIGH (ref 70–99)
Glucose-Capillary: 108 mg/dL — ABNORMAL HIGH (ref 70–99)
Glucose-Capillary: 182 mg/dL — ABNORMAL HIGH (ref 70–99)
Glucose-Capillary: 148 mg/dL — ABNORMAL HIGH (ref 70–99)

## 2023-05-18 LAB — HEMOGLOBIN AND HEMATOCRIT, BLOOD
HCT: 22.4 % — ABNORMAL LOW (ref 39.0–52.0)
Hemoglobin: 7.5 g/dL — ABNORMAL LOW (ref 13.0–17.0)

## 2023-05-18 LAB — PREPARE RBC (CROSSMATCH)

## 2023-05-18 LAB — POCT I-STAT, CHEM 8
BUN: 43 mg/dL — ABNORMAL HIGH (ref 6–20)
Calcium, Ion: 1.17 mmol/L (ref 1.15–1.40)
Chloride: 114 mmol/L — ABNORMAL HIGH (ref 98–111)
Creatinine, Ser: 5.7 mg/dL — ABNORMAL HIGH (ref 0.61–1.24)
Glucose, Bld: 109 mg/dL — ABNORMAL HIGH (ref 70–99)
HCT: 27 % — ABNORMAL LOW (ref 39.0–52.0)
Hemoglobin: 9.2 g/dL — ABNORMAL LOW (ref 13.0–17.0)
Potassium: 4.6 mmol/L (ref 3.5–5.1)
Sodium: 138 mmol/L (ref 135–145)
TCO2: 15 mmol/L — ABNORMAL LOW (ref 22–32)

## 2023-05-18 SURGERY — ERCP, WITH INTERVENTION IF INDICATED
Anesthesia: General

## 2023-05-18 MED ORDER — PHENYLEPHRINE 80 MCG/ML (10ML) SYRINGE FOR IV PUSH (FOR BLOOD PRESSURE SUPPORT)
PREFILLED_SYRINGE | INTRAVENOUS | Status: DC | PRN
  Administered 2023-05-18: 80 ug via INTRAVENOUS
  Administered 2023-05-18: 160 ug via INTRAVENOUS
  Administered 2023-05-18: 80 ug via INTRAVENOUS
  Administered 2023-05-18: 160 ug via INTRAVENOUS
  Administered 2023-05-18: 80 ug via INTRAVENOUS

## 2023-05-18 MED ORDER — DICLOFENAC SUPPOSITORY 100 MG
RECTAL | Status: DC | PRN
  Administered 2023-05-18: 100 mg via RECTAL

## 2023-05-18 MED ORDER — ROCURONIUM BROMIDE 10 MG/ML (PF) SYRINGE
PREFILLED_SYRINGE | INTRAVENOUS | Status: DC | PRN
  Administered 2023-05-18: 40 mg via INTRAVENOUS

## 2023-05-18 MED ORDER — FENTANYL CITRATE (PF) 100 MCG/2ML IJ SOLN
INTRAMUSCULAR | Status: DC | PRN
  Administered 2023-05-18: 50 ug via INTRAVENOUS

## 2023-05-18 MED ORDER — PROPOFOL 500 MG/50ML IV EMUL
INTRAVENOUS | Status: AC
  Filled 2023-05-18: qty 100

## 2023-05-18 MED ORDER — GLUCAGON HCL RDNA (DIAGNOSTIC) 1 MG IJ SOLR
INTRAMUSCULAR | Status: AC
  Filled 2023-05-18: qty 2

## 2023-05-18 MED ORDER — ONDANSETRON HCL 4 MG/2ML IJ SOLN
INTRAMUSCULAR | Status: DC | PRN
  Administered 2023-05-18: 4 mg via INTRAVENOUS

## 2023-05-18 MED ORDER — GLUCAGON HCL RDNA (DIAGNOSTIC) 1 MG IJ SOLR
INTRAMUSCULAR | Status: DC | PRN
  Administered 2023-05-18: .5 mg via INTRAVENOUS

## 2023-05-18 MED ORDER — PROPOFOL 10 MG/ML IV BOLUS
INTRAVENOUS | Status: DC | PRN
  Administered 2023-05-18: 150 mg via INTRAVENOUS

## 2023-05-18 MED ORDER — SUGAMMADEX SODIUM 200 MG/2ML IV SOLN
INTRAVENOUS | Status: DC | PRN
  Administered 2023-05-18: 200 mg via INTRAVENOUS

## 2023-05-18 MED ORDER — MIDAZOLAM HCL 2 MG/2ML IJ SOLN
INTRAMUSCULAR | Status: AC
  Filled 2023-05-18: qty 2

## 2023-05-18 MED ORDER — PHENYLEPHRINE HCL (PRESSORS) 10 MG/ML IV SOLN
INTRAVENOUS | Status: AC
Start: 2023-05-18 — End: ?
  Filled 2023-05-18: qty 1

## 2023-05-18 MED ORDER — DICLOFENAC SUPPOSITORY 100 MG
RECTAL | Status: AC
  Filled 2023-05-18: qty 1

## 2023-05-18 MED ORDER — SODIUM CHLORIDE 0.9 % IV SOLN
INTRAVENOUS | Status: DC | PRN
  Administered 2023-05-18: 7 mL

## 2023-05-18 MED ORDER — CIPROFLOXACIN IN D5W 400 MG/200ML IV SOLN
INTRAVENOUS | Status: AC
  Filled 2023-05-18: qty 200

## 2023-05-18 MED ORDER — CHOLESTYRAMINE LIGHT 4 G PO PACK
4.0000 g | PACK | Freq: Two times a day (BID) | ORAL | Status: DC
  Administered 2023-05-18 – 2023-05-19 (×2): 4 g via ORAL
  Filled 2023-05-18 (×4): qty 1

## 2023-05-18 MED ORDER — DIPHENHYDRAMINE HCL 25 MG PO CAPS
25.0000 mg | ORAL_CAPSULE | Freq: Four times a day (QID) | ORAL | Status: DC | PRN
  Administered 2023-05-18 – 2023-05-19 (×2): 25 mg via ORAL
  Filled 2023-05-18 (×3): qty 1

## 2023-05-18 MED ORDER — FENTANYL CITRATE (PF) 100 MCG/2ML IJ SOLN
INTRAMUSCULAR | Status: AC
  Filled 2023-05-18: qty 2

## 2023-05-18 MED ORDER — MIDAZOLAM HCL 5 MG/5ML IJ SOLN
INTRAMUSCULAR | Status: DC | PRN
  Administered 2023-05-18: 1 mg via INTRAVENOUS

## 2023-05-18 MED ORDER — PHENYLEPHRINE HCL (PRESSORS) 10 MG/ML IV SOLN
INTRAVENOUS | Status: AC
  Filled 2023-05-18: qty 1

## 2023-05-18 MED ORDER — PROPOFOL 1000 MG/100ML IV EMUL
INTRAVENOUS | Status: AC
  Filled 2023-05-18: qty 100

## 2023-05-18 MED ORDER — EPHEDRINE SULFATE (PRESSORS) 50 MG/ML IJ SOLN
INTRAMUSCULAR | Status: DC | PRN
  Administered 2023-05-18 (×3): 5 mg via INTRAVENOUS

## 2023-05-18 MED ORDER — SODIUM CHLORIDE 0.9 % IV SOLN
INTRAVENOUS | Status: DC | PRN
Start: 2023-05-18 — End: 2023-05-18

## 2023-05-18 MED ORDER — CIPROFLOXACIN IN D5W 400 MG/200ML IV SOLN
INTRAVENOUS | Status: DC | PRN
  Administered 2023-05-18: 400 mg via INTRAVENOUS

## 2023-05-18 MED ORDER — CHOLESTYRAMINE 5% EX OINTMENT
1.0000 | TOPICAL_OINTMENT | CUTANEOUS | Status: DC | PRN
Start: 2023-05-18 — End: 2023-05-18

## 2023-05-18 MED ORDER — SODIUM CHLORIDE 0.9% IV SOLUTION: Freq: Once | INTRAVENOUS | Status: AC

## 2023-05-18 NOTE — Progress Notes (Addendum)
ISTAT obtained per order from [facility] post-transfusion. Post-transfusion Hgb 9.2. [facility] notified. No new orders at this time.  Eulas Post, RN 05/18/23 12:50 PM

## 2023-05-18 NOTE — Progress Notes (Signed)
PROGRESS NOTE    Jeffrey Young  ZOX:[id number] DOB: 1965/11/09 DOA: 05/07/2023 PCP: Tracey Harries, MD    Brief Narrative:  57 year old gentleman with stage III colon cancer, type 2 diabetes, left foot wound, CKD stage IV, hypertension presented with bloody urine however he was noted to have jaundiced with normal blood in the urine.  Bilirubin 8.  Mildly elevated creatinine from his baseline.  Imaging suggested possible cholecystitis. Underwent ERCP on 12/19, bilirubin is still rising and scheduled for repeat ERCP.  Subjective: Seen in the morning rounds.  Denies any complaints.  Denies any nausea vomiting abdominal pain.  Looking forward for procedure.  Patient complains of itching.  No rashes. Assessment & Plan:   Obstructive jaundice due to malignancy: 12/19, status post ERCP that showed CBD obstruction due to mass, stenting done previous and FNA of the lymph nodes.  Pathology consistent with adenocarcinoma with probable: As primary. LFTs continues to rise, MRCP showed possible biliary stent migration or occlusion.  Plan for repeat ERCP today.  Followed by GI. This is likely metastatic colon cancer, oncology is following.  AKI on CKD stage IV with metabolic acidosis: Creatinine trending down.  Treated with IV fluids.  There is no evidence of hydronephrosis on the CT scan.  Monitor closely.  Anemia of chronic disease: Continues to remain low.  Received 1 unit of PRBC on 12/17.  Hemoglobin less than 7 today.  Another 1 unit today.  Hypertension: Blood pressure stable on carvedilol and amlodipine.  Type 2 diabetes: On Lantus and sliding scale.  Stable.  Left foot wound, present on admission: Dressing instructions attached.   DVT prophylaxis: SCDs Start: 05/08/23 0225   Code Status: Full code Family Communication: None at the bedside Disposition Plan: Status is: Inpatient Remains inpatient appropriate because: Inpatient procedures planned     Consultants:   Gastroenterology  Procedures:  ERCP  Antimicrobials:  None     Objective: Vitals:   05/17/23 2010 05/18/23 0525 05/18/23 0930 05/18/23 1000  BP: (!) 143/81 131/60 (!) 156/79 (!) 145/76  Pulse: 66 64 65 66  Resp: 18 18 18 20   Temp: 98.1 F (36.7 C) 98.6 F (37 C) 98.6 F (37 C) 98 F (36.7 C)  TempSrc: Oral Oral Oral Oral  SpO2: 98% 96% 96% 96%  Weight:      Height:       No intake or output data in the 24 hours ending 05/18/23 1120 Filed Weights   05/07/23 0933 05/10/23 1115  Weight: 87 kg 87 kg    Examination:  General exam: Appears calm and comfortable Icteric. Respiratory system: Clear to auscultation. Respiratory effort normal. Cardiovascular system: S1 & S2 heard,  Gastrointestinal system: Soft.  Nontender.  Bowel sound present. Central nervous system: Alert and oriented. No focal neurological deficits. Extremities: Symmetric 5 x 5 power.    Data Reviewed: I have personally reviewed following labs and imaging studies  CBC: Recent Labs  Lab 05/11/23 1707 05/12/23 0304 05/15/23 0112 05/17/23 0256 05/18/23 0319  WBC 9.0 8.2 6.9 6.5 5.9  NEUTROABS 6.8  --   --   --   --   HGB 8.0* 7.9* 8.0* 7.1* 6.7*  HCT 23.1* 23.4* 25.0* 21.4* 20.6*  MCV 90.9 92.1 95.1 94.3 94.9  PLT 292 280 238 189 187   Basic Metabolic Panel: Recent Labs  Lab 05/14/23 0217 05/15/23 0112 05/16/23 0316 05/17/23 0256 05/18/23 0319  NA 133* 131* 128* 128* 132*  K 4.0 4.0 3.9 4.0 4.1  CL 109 108 106  107 108  CO2 15* 16* 15* 14* 15*  GLUCOSE 101* 119* 133* 94 130*  BUN 43* 44* 45* 50* 49*  CREATININE 4.23* 4.30* 4.37* 4.37* 4.46*  CALCIUM 7.7* 8.0* 7.7* 7.9* 8.0*   GFR: Estimated Creatinine Clearance: 20.2 mL/min (A) (by C-G formula based on SCr of 4.46 mg/dL (H)). Liver Function Tests: Recent Labs  Lab 05/14/23 0217 05/15/23 0112 05/16/23 0316 05/17/23 0256 05/18/23 0319  AST 161* 189* 167* 151* 147*  ALT 127* 158* 154* 149* 151*  ALKPHOS 929* 1,136* 1,060*  1,068* 1,103*  BILITOT 9.4* 12.5* 12.6* 14.5* 14.5*  PROT 5.4* 6.0* 5.5* 5.5* 5.5*  ALBUMIN 1.9* 2.0* 1.9* 2.1* 1.9*   No results for input(s): "LIPASE", "AMYLASE" in the last 168 hours. No results for input(s): "AMMONIA" in the last 168 hours. Coagulation Profile: No results for input(s): "INR", "PROTIME" in the last 168 hours. Cardiac Enzymes: No results for input(s): "CKTOTAL", "CKMB", "CKMBINDEX", "TROPONINI" in the last 168 hours. BNP (last 3 results) No results for input(s): "PROBNP" in the last 8760 hours. HbA1C: No results for input(s): "HGBA1C" in the last 72 hours. CBG: Recent Labs  Lab 05/17/23 0737 05/17/23 1141 05/17/23 1703 05/17/23 2201 05/18/23 0813  GLUCAP 71 89 100* 125* 119*   Lipid Profile: No results for input(s): "CHOL", "HDL", "LDLCALC", "TRIG", "CHOLHDL", "LDLDIRECT" in the last 72 hours. Thyroid Function Tests: No results for input(s): "TSH", "T4TOTAL", "FREET4", "T3FREE", "THYROIDAB" in the last 72 hours. Anemia Panel: No results for input(s): "VITAMINB12", "FOLATE", "FERRITIN", "TIBC", "IRON", "RETICCTPCT" in the last 72 hours. Sepsis Labs: No results for input(s): "PROCALCITON", "LATICACIDVEN" in the last 168 hours.  No results found for this or any previous visit (from the past 240 hours).       Radiology Studies: No results found.      Scheduled Meds:  sodium chloride   Intravenous Once   amLODipine  5 mg Oral Daily   carvedilol  12.5 mg Oral BID WC   Chlorhexidine Gluconate Cloth  6 each Topical Daily   insulin aspart  0-6 Units Subcutaneous TID WC   insulin glargine-yfgn  10 Units Subcutaneous BID   multivitamin with minerals  1 tablet Oral Daily   pantoprazole  40 mg Oral Daily   sodium chloride flush  10-40 mL Intracatheter Q12H   Continuous Infusions:   LOS: 11 days    Time spent: 35 minutes    Dorcas Carrow, MD [facility]

## 2023-05-18 NOTE — Anesthesia Procedure Notes (Signed)
Procedure Name: Intubation Date/Time: 05/18/2023 2:50 PM  Performed by: Theodosia Quay, CRNAPre-anesthesia Checklist: Patient identified, Emergency Drugs available, Suction available, Patient being monitored and Timeout performed Patient Re-evaluated:Patient Re-evaluated prior to induction Oxygen Delivery Method: Circle system utilized Preoxygenation: Pre-oxygenation with 100% oxygen Induction Type: IV induction Ventilation: Mask ventilation without difficulty Laryngoscope Size: Mac and 4 Grade View: Grade II Tube type: Oral Tube size: 7.5 mm Number of attempts: 1 Airway Equipment and Method: Stylet Placement Confirmation: ETT inserted through vocal cords under direct vision, positive ETCO2, CO2 detector and breath sounds checked- equal and bilateral Secured at: 23 cm Tube secured with: Tape Dental Injury: Teeth and Oropharynx as per pre-operative assessment  Comments: ATOI

## 2023-05-18 NOTE — Interval H&P Note (Signed)
History and Physical Interval Note:  05/18/2023 2:26 PM  Jeffrey Young  has presented today for surgery, with the diagnosis of elevated liver enzymes.  The various methods of treatment have been discussed with the patient and family. After consideration of risks, benefits and other options for treatment, the patient has consented to  Procedure(s): ENDOSCOPIC RETROGRADE CHOLANGIOPANCREATOGRAPHY (ERCP) (N/A) as a surgical intervention.  The patient's history has been reviewed, patient examined, no change in status, stable for surgery.  I have reviewed the patient's chart and labs.  Questions were answered to the patient's satisfaction.     Jeffrey Young

## 2023-05-18 NOTE — Progress Notes (Signed)
Jeffrey Young   DOB:05-13-1966   PI#:[id number]      ASSESSMENT & PLAN:  1.  Descending colon cancer, stage IIIc with liver mets - Status post left partial colectomy 07/30/2020.  Status post 12 cycles FOLFOX chemotherapy in 2022. - Progression of disease October 2024. - Has not been on active cancer treatment has been on close observation. - MRI/MRCP this admission showed 2 liver masses concerning for mets. ERCP and EUS done on 05/10/2023. - However his bilirubin continues to increase, agree with repeat ERCP with a larger stent scheduled for today 05/18/2023. - Total bilirubin has steadily crept upwards 14.5 today. - Medical oncology/Dr. Truett Perna following patient closely.  Systemic treatment options to be decided after improvement in biliary system.   2.  Report of Gross hematuria -Patient's hematuria is likely secondary to the large urine from elevated bilirubin levels - the anemia is likely secondary to chronic disease and renal failure -Continue to monitor CBC  3.  Anemia - Likely secondary to renal failure chronic disease and malignancy - Transfuse PRBC for Hgb less than 7.0.  Patient with Hgb 6.7 today, agree with 1 unit PRBC transfusion. - Monitor CBC with differential closely   4.  Renal insufficiency - Baseline usually in the 3 range - Creatinine remains elevated in the 4 range -Avoid nephrotoxic medications - Monitor CMP closely - Nephrology consult may be needed   5.  Obstructive jaundice, ongoing -likely secondary to lymphadenopathy -Status post stent placement.  For repeat ERCP today 05/18/2023. -LFTs continue to be high, continue to monitor    6.  Diabetes -Continue to monitor blood sugar levels -Administer insulin as needed -Follow-up with Endocrine/PCP  Code Status Full   Subjective:  Patient seen awake and alert laying supine in bed.  Blood transfusion currently ongoing.  Patient reports he feels okay and notes that he is waiting to go for GI procedure in  a few minutes.  No acute distress noted.  Objective:  Vitals:   05/17/23 2010 05/18/23 0525  BP: (!) 143/81 131/60  Pulse: 66 64  Resp: 18 18  Temp: 98.1 F (36.7 C) 98.6 F (37 C)  SpO2: 98% 96%    No intake or output data in the 24 hours ending 05/18/23 0917   REVIEW OF SYSTEMS:   Constitutional: Denies fevers, chills or abnormal night sweats Eyes: + Yellow eyes  Ears, nose, mouth, throat, and face: Denies mucositis or sore throat Respiratory: Denies cough, dyspnea or wheezes Cardiovascular: Denies palpitation, chest discomfort or lower extremity swelling Gastrointestinal:  Denies nausea, heartburn or change in bowel habits Skin: Denies abnormal skin rashes + left metatarsal amputee Lymphatics: Denies new lymphadenopathy or easy bruising Neurological: Denies numbness, tingling or new weaknesses Behavioral/Psych: Mood is stable, no new changes  All other systems were reviewed with the patient and are negative.  PHYSICAL EXAMINATION: ECOG PERFORMANCE STATUS: 2 - Symptomatic, <50% confined to bed  Vitals:   05/17/23 2010 05/18/23 0525  BP: (!) 143/81 131/60  Pulse: 66 64  Resp: 18 18  Temp: 98.1 F (36.7 C) 98.6 F (37 C)  SpO2: 98% 96%   Filed Weights   05/07/23 0933 05/10/23 1115  Weight: 191 lb 12.8 oz (87 kg) 191 lb 12.8 oz (87 kg)    GENERAL: alert, no distress and comfortable SKIN: skin color, texture, turgor are normal, no rashes or significant lesions EYES: normal, conjunctiva are pink and non-injected, + scleral icterus OROPHARYNX: no exudate, no erythema and lips, buccal mucosa, and tongue normal  NECK: supple, thyroid normal size, non-tender, without nodularity LYMPH: no palpable lymphadenopathy in the cervical, axillary or inguinal LUNGS: clear to auscultation and percussion with normal breathing effort HEART: regular rate & rhythm and no murmurs and no lower extremity edema ABDOMEN: abdomen soft, non-tender and normal bowel sounds MUSCULOSKELETAL:  no cyanosis of digits and no clubbing +left metatarsal amputee PSYCH: alert & oriented x 3 with fluent speech NEURO: no focal motor/sensory deficits   All questions were answered. The patient knows to call the clinic with any problems, questions or concerns.   The total time spent in the appointment was 40 minutes encounter with patient including review of chart and various tests results, discussions about plan of care and coordination of care plan  Jeffrey Bills, NP 05/18/2023 9:17 AM    Labs Reviewed:  Lab Results  Component Value Date   WBC 5.9 05/18/2023   HGB 6.7 (LL) 05/18/2023   HCT 20.6 (L) 05/18/2023   MCV 94.9 05/18/2023   PLT 187 05/18/2023   Recent Labs    05/09/23 0319 05/10/23 0406 05/11/23 0318 05/11/23 1707 05/16/23 0316 05/17/23 0256 05/18/23 0319  NA 130* 130* 127*   < > 128* 128* 132*  K 3.8 3.5 4.1   < > 3.9 4.0 4.1  CL 104 102 102   < > 106 107 108  CO2 19* 18* 16*   < > 15* 14* 15*  GLUCOSE 144* 100* 224*   < > 133* 94 130*  BUN 40* 40* 42*   < > 45* 50* 49*  CREATININE 4.18* 4.14* 3.37*   < > 4.37* 4.37* 4.46*  CALCIUM 7.5* 8.0* 7.9*   < > 7.7* 7.9* 8.0*  GFRNONAA 16* 16* 20*   < > 15* 15* 14*  PROT 5.5* 5.9* 6.1*   < > 5.5* 5.5* 5.5*  ALBUMIN 1.8* 1.9* 2.0*   < > 1.9* 2.1* 1.9*  AST 89* 97* 102*   < > 167* 151* 147*  ALT 80* 84* 93*   < > 154* 149* 151*  ALKPHOS 865* 966* 1,013*   < > 1,060* 1,068* 1,103*  BILITOT 11.8* 13.6* 13.7*   < > 12.6* 14.5* 14.5*  BILIDIR 7.7* 8.9* 8.7*  --   --   --   --   IBILI 4.1* 4.7* 5.0*  --   --   --   --    < > = values in this interval not displayed.    Studies Reviewed:  MR ABDOMEN MRCP W WO CONTAST Result Date: 05/15/2023 CLINICAL DATA:  Obstructive jaundice due to malignant neoplasm. Recent ERCP with biliary stent placement. EXAM: MRI ABDOMEN WITHOUT AND WITH CONTRAST (INCLUDING MRCP) TECHNIQUE: Multiplanar multisequence MR imaging of the abdomen was performed both before and after the administration  of intravenous contrast. Heavily T2-weighted images of the biliary and pancreatic ducts were obtained, and three-dimensional MRCP images were rendered by post processing. CONTRAST:  8mL GADAVIST GADOBUTROL 1 MMOL/ML IV SOLN COMPARISON:  MRCP 05/08/2023, abdominopelvic CT 05/07/2023, ultrasound 05/14/2023 and images from ERCP 05/10/2023. FINDINGS: Technical note: Despite efforts by the technologist and patient, moderate motion artifact is present on today's exam and could not be eliminated. This reduces exam sensitivity and specificity. Lower chest: New small bilateral pleural effusions with associated dependent atelectasis at both lung bases. Hepatobiliary: The liver as a non cirrhotic morphology, without significant steatosis. Although assessment is mildly limited by the motion artifact, no focal liver lesions are identified on today's study to suggest metastatic disease. There  is persistent at least moderate intra and extrahepatic biliary dilatation, similar to the previous MRI. The common bile duct measures up to 1.5 cm in diameter. There is an abrupt transition in duct caliber with shelving above the pancreatic head, consistent with known malignant biliary stricture. The biliary stent is not well visualized (questionably seen on image 20 of series 11). Cannot exclude stent migration given the persistent biliary dilatation. No well-defined mass is identified, although there is an approximately 1.6 cm area of restricted diffusion and decreased enhancement at the level of the biliary obstruction (image 47/15). Mild gallbladder distension and wall thickening, slightly improved from previous MRI. Pancreas: No evidence of pancreatic mass, pancreatic ductal dilatation or surrounding inflammation. Spleen: Normal in size without focal abnormality. Adrenals/Urinary Tract: Both adrenal glands appear normal. No evidence of renal mass or hydronephrosis. Unchanged renal cysts bilaterally for which no specific follow-up  imaging is recommended. Stomach/Bowel: The stomach appears unremarkable for its degree of distension. No evidence of bowel wall thickening, distention or surrounding inflammatory change. Vascular/Lymphatic: Again demonstrated are multiple enlarged peripancreatic and porta hepatis lymph nodes, measuring up to 1.8 cm short axis on image 18/6. These are most obvious on the diffusion-weighted images. The portal, superior mesenteric and splenic veins are patent without evidence of tumor encasement. No significant arterial abnormalities are seen. Other: Mild body wall edema without ascites or focal extraluminal fluid collection. Musculoskeletal: No acute or significant osseous findings. IMPRESSION: 1. Persistent at least moderate intra and extrahepatic biliary dilatation with abrupt transition in duct caliber above the pancreatic head, consistent with known malignant biliary stricture. The biliary stent is not well visualized. Cannot exclude stent migration given the persistent biliary dilatation. Suggest radiographic follow-up. 2. No well-defined mass is identified, although there is an approximately 1.6 cm area of restricted diffusion and decreased enhancement at the level of the biliary obstruction. 3. Persistent peripancreatic and porta hepatis adenopathy, consistent with metastatic disease. 4. No definite evidence of hepatic metastatic disease on today's motion limited examination. 5. New small bilateral pleural effusions with associated dependent atelectasis at both lung bases. Electronically Signed   By: Carey Bullocks M.D.   On: 05/15/2023 14:32   MR 3D Recon At Scanner Result Date: 05/15/2023 CLINICAL DATA:  Obstructive jaundice due to malignant neoplasm. Recent ERCP with biliary stent placement. EXAM: MRI ABDOMEN WITHOUT AND WITH CONTRAST (INCLUDING MRCP) TECHNIQUE: Multiplanar multisequence MR imaging of the abdomen was performed both before and after the administration of intravenous contrast. Heavily  T2-weighted images of the biliary and pancreatic ducts were obtained, and three-dimensional MRCP images were rendered by post processing. CONTRAST:  8mL GADAVIST GADOBUTROL 1 MMOL/ML IV SOLN COMPARISON:  MRCP 05/08/2023, abdominopelvic CT 05/07/2023, ultrasound 05/14/2023 and images from ERCP 05/10/2023. FINDINGS: Technical note: Despite efforts by the technologist and patient, moderate motion artifact is present on today's exam and could not be eliminated. This reduces exam sensitivity and specificity. Lower chest: New small bilateral pleural effusions with associated dependent atelectasis at both lung bases. Hepatobiliary: The liver as a non cirrhotic morphology, without significant steatosis. Although assessment is mildly limited by the motion artifact, no focal liver lesions are identified on today's study to suggest metastatic disease. There is persistent at least moderate intra and extrahepatic biliary dilatation, similar to the previous MRI. The common bile duct measures up to 1.5 cm in diameter. There is an abrupt transition in duct caliber with shelving above the pancreatic head, consistent with known malignant biliary stricture. The biliary stent is not well visualized (questionably seen  on image 20 of series 11). Cannot exclude stent migration given the persistent biliary dilatation. No well-defined mass is identified, although there is an approximately 1.6 cm area of restricted diffusion and decreased enhancement at the level of the biliary obstruction (image 47/15). Mild gallbladder distension and wall thickening, slightly improved from previous MRI. Pancreas: No evidence of pancreatic mass, pancreatic ductal dilatation or surrounding inflammation. Spleen: Normal in size without focal abnormality. Adrenals/Urinary Tract: Both adrenal glands appear normal. No evidence of renal mass or hydronephrosis. Unchanged renal cysts bilaterally for which no specific follow-up imaging is recommended. Stomach/Bowel:  The stomach appears unremarkable for its degree of distension. No evidence of bowel wall thickening, distention or surrounding inflammatory change. Vascular/Lymphatic: Again demonstrated are multiple enlarged peripancreatic and porta hepatis lymph nodes, measuring up to 1.8 cm short axis on image 18/6. These are most obvious on the diffusion-weighted images. The portal, superior mesenteric and splenic veins are patent without evidence of tumor encasement. No significant arterial abnormalities are seen. Other: Mild body wall edema without ascites or focal extraluminal fluid collection. Musculoskeletal: No acute or significant osseous findings. IMPRESSION: 1. Persistent at least moderate intra and extrahepatic biliary dilatation with abrupt transition in duct caliber above the pancreatic head, consistent with known malignant biliary stricture. The biliary stent is not well visualized. Cannot exclude stent migration given the persistent biliary dilatation. Suggest radiographic follow-up. 2. No well-defined mass is identified, although there is an approximately 1.6 cm area of restricted diffusion and decreased enhancement at the level of the biliary obstruction. 3. Persistent peripancreatic and porta hepatis adenopathy, consistent with metastatic disease. 4. No definite evidence of hepatic metastatic disease on today's motion limited examination. 5. New small bilateral pleural effusions with associated dependent atelectasis at both lung bases. Electronically Signed   By: Carey Bullocks M.D.   On: 05/15/2023 14:32   US Abdomen Limited RUQ (LIVER/GB) Result Date: 05/14/2023 CLINICAL DATA:  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  CT 05/07/2023.  MRI 05/08/2023 FINDINGS: Gallbladder: Gallbladder is somewhat contracted and filled with heterogeneous material. This is likely combination of stones and sludge. There is  progression since the previous study. No gallbladder wall thickening or pericholecystic edema. Murphy's sign is negative. Common bile duct: Diameter: 14 mm, dilated. Liver: Intrahepatic bile duct dilatation is demonstrated. Focal liver lesion seen at previous imaging studies are not demonstrated on the current examination. Portal vein is patent on color Doppler imaging with normal direction of blood flow towards the liver. Other: None. IMPRESSION: 1. Somewhat contracted gallbladder filled with heterogeneous material likely representing stones and sludge. This is progressed since the prior study. 2. Intra and extrahepatic bile duct dilatation consistent with changes due to known biliary malignancy. Electronically Signed   By: Burman Nieves M.D.   On: 05/14/2023 23:10   DG ERCP Result Date: 05/10/2023 CLINICAL DATA:  ERCP EXAM: ERCP TECHNIQUE: Multiple spot images obtained with the fluoroscopic device and submitted for interpretation post-procedure. FLUOROSCOPY TIME: FLUOROSCOPY TIME 1 minute, 20 seconds (30.8 mGy) COMPARISON:  MRCP-05/08/2023 FINDINGS: 8 spot fluoroscopic images of the right upper abdominal quadrant during ERCP are provided for review. Initial image demonstrates an ERCP probe overlying the right upper abdominal quadrant. Subsequent images demonstrate selective cannulation and opacification of the central aspect of the common hepatic duct with opacification of the intrahepatic biliary tree which appears dilated. Completion images demonstrate placement of a plastic stent overlying the expected location of the CBD. IMPRESSION: ERCP with  biliary stent placement as above. These images were submitted for radiologic interpretation only. Please see the procedural report for the amount of contrast and the fluoroscopy time utilized. Electronically Signed   By: Simonne Come M.D.   On: 05/10/2023 15:08   US Abdomen Limited RUQ (LIVER/GB) Result Date: 05/08/2023 CLINICAL DATA:  Painless jaundice  Cholecystitis EXAM: ULTRASOUND ABDOMEN LIMITED RIGHT UPPER QUADRANT COMPARISON:  MRI abdomen nodule 06/07/2022 FINDINGS: Gallbladder: Gallstones: Present Sludge: Present Gallbladder Wall: Within normal limits Pericholecystic fluid: Minimal Sonographic Murphy's Sign: Negative per technologist Common bile duct: Diameter: 13 mm Liver: Parenchymal echogenicity: Within normal limits Contours: Normal Lesions: None Portal vein: Patent.  Hepatopetal flow Other: None. IMPRESSION: 1. Cholelithiasis without definitive evidence of acute cholecystitis. If there is continued suspicion for cholecystitis, further evaluation with HIDA scan would be beneficial. 2. Dilated common bile duct again seen which remains suspicious for stricture/mass. Electronically Signed   By: Acquanetta Belling M.D.   On: 05/08/2023 10:14   MR ABDOMEN MRCP WO CONTRAST Result Date: 05/08/2023 CLINICAL DATA:  Inpatient. Jaundice. Chronic kidney disease. Metastatic colon cancer. EXAM: MRI ABDOMEN WITHOUT CONTRAST  (INCLUDING MRCP) TECHNIQUE: Multiplanar multisequence MR imaging of the abdomen was performed. Heavily T2-weighted images of the biliary and pancreatic ducts were obtained, and three-dimensional MRCP images were rendered by post processing. COMPARISON:  05/07/2023 CT abdomen/pelvis. FINDINGS: Lower chest: Trace posterior right pleural effusion. Hepatobiliary: Normal liver size and configuration. No hepatic steatosis. There is a solitary 1.1 x 0.9 cm anterior segment 2 left liver mass (series 7/image 10 and best seen on diffusion sequence series 5/image 10), worrisome for liver metastasis. No additional liver lesions. Mild gallbladder distention. Moderate diffuse gallbladder wall thickening. Minimal pericholecystic fluid and fat stranding. Small amount of layering sludge in the gallbladder. No gallstones. Moderate diffuse intrahepatic biliary ductal dilatation. Dilated proximal common bile duct with diameter 13 mm. Abrupt malignant-appearing biliary  stricture at the level of the middle third of the common bile duct with diminutive caliber of the lower third of the common bile duct (1 mm diameter). No choledocholithiasis. There is a mildly enlarged peripancreatic/porta hepatis 1.1 cm pathologic lymph node abutting the site of the common bile duct stricture (series 3/image 16). Pancreas: No pancreatic mass or duct dilation. No peripancreatic fluid collections. Unable to confidently assess for pancreas divisum given motion degradation on the MRCP sequences. Spleen: Normal size. No mass. Adrenals/Urinary Tract: Normal adrenals. No hydronephrosis. Small simple bilateral renal cysts, largest 3.5 cm in the interpolar right kidney, for which no follow-up imaging is recommended. Stomach/Bowel: Normal non-distended stomach. Visualized small and large bowel is normal caliber, with no bowel wall thickening. Partially visualized postsurgical changes from partial left colectomy. Vascular/Lymphatic: Normal caliber abdominal aorta. Multiple mildly enlarged porta hepatis nodes up to 1.9 cm (series 5/image 16). Mildly enlarged 1.0 cm portacaval node (series 5/image 22). Other: No abdominal ascites or focal fluid collection. Musculoskeletal: No aggressive appearing focal osseous lesions. IMPRESSION: 1. Abrupt malignant-appearing biliary stricture at the level of the middle third of the common bile duct, with adjacent porta hepatis adenopathy. Moderate diffuse intrahepatic and proximal extrahepatic biliary ductal dilatation. No choledocholithiasis. 2. Mild gallbladder distention with moderate diffuse gallbladder wall thickening. Minimal pericholecystic fluid and fat stranding. Small amount of layering sludge in the gallbladder. No gallstones. Findings are nonspecific, with differential including acute cholecystitis versus reactive changes to biliary obstruction. 3. Solitary 1.1 cm anterior segment 2 left liver mass, worrisome for liver metastasis. 4. Multiple mildly enlarged porta  hepatis and portacaval nodes, suspicious  for metastatic adenopathy. 5. Trace posterior right pleural effusion. Electronically Signed   By: Delbert Phenix M.D.   On: 05/08/2023 09:03   MR 3D Recon At Scanner Result Date: 05/08/2023 CLINICAL DATA:  Inpatient. Jaundice. Chronic kidney disease. Metastatic colon cancer. EXAM: MRI ABDOMEN WITHOUT CONTRAST  (INCLUDING MRCP) TECHNIQUE: Multiplanar multisequence MR imaging of the abdomen was performed. Heavily T2-weighted images of the biliary and pancreatic ducts were obtained, and three-dimensional MRCP images were rendered by post processing. COMPARISON:  05/07/2023 CT abdomen/pelvis. FINDINGS: Lower chest: Trace posterior right pleural effusion. Hepatobiliary: Normal liver size and configuration. No hepatic steatosis. There is a solitary 1.1 x 0.9 cm anterior segment 2 left liver mass (series 7/image 10 and best seen on diffusion sequence series 5/image 10), worrisome for liver metastasis. No additional liver lesions. Mild gallbladder distention. Moderate diffuse gallbladder wall thickening. Minimal pericholecystic fluid and fat stranding. Small amount of layering sludge in the gallbladder. No gallstones. Moderate diffuse intrahepatic biliary ductal dilatation. Dilated proximal common bile duct with diameter 13 mm. Abrupt malignant-appearing biliary stricture at the level of the middle third of the common bile duct with diminutive caliber of the lower third of the common bile duct (1 mm diameter). No choledocholithiasis. There is a mildly enlarged peripancreatic/porta hepatis 1.1 cm pathologic lymph node abutting the site of the common bile duct stricture (series 3/image 16). Pancreas: No pancreatic mass or duct dilation. No peripancreatic fluid collections. Unable to confidently assess for pancreas divisum given motion degradation on the MRCP sequences. Spleen: Normal size. No mass. Adrenals/Urinary Tract: Normal adrenals. No hydronephrosis. Small simple bilateral  renal cysts, largest 3.5 cm in the interpolar right kidney, for which no follow-up imaging is recommended. Stomach/Bowel: Normal non-distended stomach. Visualized small and large bowel is normal caliber, with no bowel wall thickening. Partially visualized postsurgical changes from partial left colectomy. Vascular/Lymphatic: Normal caliber abdominal aorta. Multiple mildly enlarged porta hepatis nodes up to 1.9 cm (series 5/image 16). Mildly enlarged 1.0 cm portacaval node (series 5/image 22). Other: No abdominal ascites or focal fluid collection. Musculoskeletal: No aggressive appearing focal osseous lesions. IMPRESSION: 1. Abrupt malignant-appearing biliary stricture at the level of the middle third of the common bile duct, with adjacent porta hepatis adenopathy. Moderate diffuse intrahepatic and proximal extrahepatic biliary ductal dilatation. No choledocholithiasis. 2. Mild gallbladder distention with moderate diffuse gallbladder wall thickening. Minimal pericholecystic fluid and fat stranding. Small amount of layering sludge in the gallbladder. No gallstones. Findings are nonspecific, with differential including acute cholecystitis versus reactive changes to biliary obstruction. 3. Solitary 1.1 cm anterior segment 2 left liver mass, worrisome for liver metastasis. 4. Multiple mildly enlarged porta hepatis and portacaval nodes, suspicious for metastatic adenopathy. 5. Trace posterior right pleural effusion. Electronically Signed   By: Delbert Phenix M.D.   On: 05/08/2023 09:03   CT ABDOMEN PELVIS WO CONTRAST Result Date: 05/07/2023 CLINICAL DATA:  History of colon cancer, metastatic. No which on this and worsening renal failure. * Tracking Code: BO * EXAM: CT ABDOMEN AND PELVIS WITHOUT CONTRAST TECHNIQUE: Multidetector CT imaging of the abdomen and pelvis was performed following the standard protocol without IV contrast. RADIATION DOSE REDUCTION: This exam was performed according to the departmental  dose-optimization program which includes automated exposure control, adjustment of the mA and/or kV according to patient size and/or use of iterative reconstruction technique. COMPARISON:  03/05/2023 FINDINGS: Lower chest: Dependent atelectasis in the lung bases. Hepatobiliary: Intrahepatic biliary duct dilatation is new in the interval. There may be some associated periportal edema. Portal  venous anatomy is prominent and ill-defined. Density in the portal vein is similar to background blood pool density in the aorta. Common duct is dilated up to 15 mm diameter and appears to abruptly terminate prior to entering the head of the pancreas. Gallbladder is distended with ill definition of the gallbladder wall and potential pericholecystic edema/fluid. Pancreas: No focal mass lesion. No dilatation of the main duct. No intraparenchymal cyst. No peripancreatic edema. Spleen: No splenomegaly. No suspicious focal mass lesion. Adrenals/Urinary Tract: No adrenal nodule or mass. Stable right renal cyst. No followup imaging is recommended. Left kidney unremarkable on noncontrast imaging. No hydroureteronephrosis. Bladder is decompressed. Stomach/Bowel: Mild circumferential wall thickening noted distal esophagus. Stomach is unremarkable. No gastric wall thickening. No evidence of outlet obstruction. Duodenum is normally positioned as is the ligament of Treitz. No small bowel wall thickening. No small bowel dilatation. The terminal ileum is normal. The appendix is normal. Status post left hemicolectomy with reanastomosis. Vascular/Lymphatic: No abdominal aortic aneurysm. No abdominal aortic atherosclerotic calcification. Small lymph nodes in the gastrohepatic ligament (image 21/2) are similar to prior. Small lymph nodes also noted hepatoduodenal ligament. Index 14 mm short axis hepatoduodenal ligament node measured previously is 16 mm on 27/2 today. Index left external iliac lymph node measured previously at 14 mm short axis is 14  mm again today on 76/2. Other small lymph nodes along the left pelvic sidewall again noted. Reproductive: The prostate gland and seminal vesicles are unremarkable. Other: No intraperitoneal free fluid. Musculoskeletal: Small left groin hernia contains only fat. No worrisome lytic or sclerotic osseous abnormality. IMPRESSION: 1. New intrahepatic and extrahepatic biliary duct dilatation with possible associated periportal edema. Common duct is dilated up to 15 mm diameter and appears to abruptly terminate prior to entering the head of the pancreas. Imaging features are compatible with biliary obstruction, potentially secondary to the hepatoduodenal ligament lymphadenopathy seen previously. Assessment is limited due to lack of intravenous contrast material. 2. Distended gallbladder with possible gallbladder wall thickening and pericholecystic edema. Right upper quadrant ultrasound recommended to evaluate for acute cholecystitis. 3. No substantial change in lymphadenopathy of the gastrohepatic ligament, hepatoduodenal ligament, and left external iliac chain. 4. Tiny hypoattenuating foci seen previously in the dome of the left liver are not evident on the current exam. 5. Mild circumferential wall thickening distal esophagus. Esophagitis would be a consideration. 6. Small left groin hernia contains only fat. Electronically Signed   By: Kennith Center M.D.   On: 05/07/2023 14:04

## 2023-05-18 NOTE — Progress Notes (Signed)
    Patient Name: Jeffrey Young           DOB: 01-21-1966  MRN: [id number]      Admission Date: 05/07/2023  Attending Provider: Briant Cedar, MD  Primary Diagnosis: Obstructive jaundice due to malignant neoplasm [facility])   Level of care: Progressive    CROSS COVER NOTE   Date of Service   05/18/2023   Kazi Thron, 57 y.o. male, was admitted on 05/07/2023 for Obstructive jaundice due to malignant neoplasm [facility]).    HPI/Events of Note   Normocytic anemia Anemia of chronic disease/malignancy Hemoglobin 7.1 -->  6.7.  No acute changes reported.  Hemodynamically stable. No melena, hematochezia, or other bleeding reported tonight.    Interventions/ Plan   Blood transfusion, 1 unit PRBC Recheck H&H after transfusion is completed, transfuse if HGB <7       Anthoney Harada, DNP, [employer] [facility] [place]

## 2023-05-18 NOTE — Transfer of Care (Signed)
Immediate Anesthesia Transfer of Care Note  Patient: Jeffrey Young  Procedure(s) Performed: Procedure(s): ENDOSCOPIC RETROGRADE CHOLANGIOPANCREATOGRAPHY (ERCP) (N/A) STENT REMOVAL SPHINCTEROTOMY BILIARY STENT PLACEMENT (N/A)  Patient Location: PACU  Anesthesia Type:General  Level of Consciousness:  sedated, patient cooperative and responds to stimulation  Airway & Oxygen Therapy:Patient Spontanous Breathing and Patient connected to face mask oxgen  Post-op Assessment:  Report given to PACU RN and Post -op Vital signs reviewed and stable  Post vital signs:  Reviewed and stable  Last Vitals:  Vitals:   05/18/23 1240 05/18/23 1544  BP: (!) 163/81 (!) 126/58  Pulse: 62 (!) 59  Resp: (!) 21 (!) 23  Temp: (!) 36.2 C   SpO2: 98% 100%    Complications: No apparent anesthesia complications

## 2023-05-18 NOTE — Anesthesia Preprocedure Evaluation (Addendum)
Anesthesia Evaluation  Patient identified by MRN, date of birth, ID band Patient awake    Reviewed: Allergy & Precautions, NPO status , Patient's Chart, lab work & pertinent test results, reviewed documented beta blocker date and time   Airway Mallampati: III  TM Distance: >3 FB Neck ROM: Full    Dental  (+) Edentulous Upper, Dental Advisory Given, Partial Lower, Missing   Pulmonary neg pulmonary ROS   Pulmonary exam normal breath sounds clear to auscultation       Cardiovascular hypertension (131/60 preop), Pt. on medications and Pt. on home beta blockers Normal cardiovascular exam Rhythm:Regular Rate:Normal  Echo 2022  1. Left ventricular ejection fraction, by estimation, is 60 to 65%. The  left ventricle has normal function. The left ventricle has no regional  wall motion abnormalities. There is mild left ventricular hypertrophy.  Left ventricular diastolic parameters  are consistent with Grade I diastolic dysfunction (impaired relaxation).   2. Right ventricular systolic function is normal. The right ventricular  size is normal.   3. The mitral valve is normal in structure. No evidence of mitral valve  regurgitation. No evidence of mitral stenosis.   4. The aortic valve is normal in structure. Aortic valve regurgitation is  not visualized. No aortic stenosis is present.   5. The inferior vena cava is normal in size with greater than 50%  respiratory variability, suggesting right atrial pressure of 3 mmHg.     Neuro/Psych negative neurological ROS  negative psych ROS   GI/Hepatic negative GI ROS, Neg liver ROS,,,Stage 3 CRC s/p lap colectomy 2022  Obstructive jaundice Possibly due to malignancy Imaging suggestive of CBD obstruction     Endo/Other  diabetes, Well Controlled, Type 2    Renal/GU negative Renal ROS  negative genitourinary   Musculoskeletal  (+) Arthritis , Osteoarthritis,    Abdominal   Peds   Hematology  (+) Blood dyscrasia, anemia Hb 6.7 this AM, s/p 1 unit PRBC istat Hb 9   Anesthesia Other Findings   Reproductive/Obstetrics negative OB ROS                             Anesthesia Physical Anesthesia Plan  ASA: 3  Anesthesia Plan: General   Post-op Pain Management: Tylenol PO (pre-op)*   Induction: Intravenous  PONV Risk Score and Plan: 2 and Ondansetron, Dexamethasone, Midazolam and Treatment may vary due to age or medical condition  Airway Management Planned: Oral ETT  Additional Equipment: None  Intra-op Plan:   Post-operative Plan: Extubation in OR  Informed Consent: I have reviewed the patients History and Physical, chart, labs and discussed the procedure including the risks, benefits and alternatives for the proposed anesthesia with the patient or authorized representative who has indicated his/her understanding and acceptance.     Dental advisory given  Plan Discussed with: CRNA  Anesthesia Plan Comments: (1 unit prbc prior to ERCP)       Anesthesia Quick Evaluation

## 2023-05-18 NOTE — Plan of Care (Signed)

## 2023-05-18 NOTE — Op Note (Signed)
[facility] Patient Name: Jeffrey Young Procedure Date: 05/18/2023 MRN: [id number] Attending MD: Jeani Hawking , MD, [id number] Date of Birth: 1965-09-10 CSN: [id number] Age: 57 Admit Type: Inpatient Procedure:                ERCP Indications:              Malignant stricture of the common bile duct Providers:                Jeani Hawking, MD, Fransisca Connors, Kandice Robinsons, Technician, Rhodia Albright, Technician Referring MD:              Medicines:                General Anesthesia Complications:            No immediate complications. Estimated Blood Loss:     Estimated blood loss: none. Procedure:                Pre-Anesthesia Assessment:                           - Prior to the procedure, a History and Physical                            was performed, and patient medications and                            allergies were reviewed. The patient's tolerance of                            previous anesthesia was also reviewed. The risks                            and benefits of the procedure and the sedation                            options and risks were discussed with the patient.                            All questions were answered, and informed consent                            was obtained. Prior Anticoagulants: The patient has                            taken no anticoagulant or antiplatelet agents. ASA                            Grade Assessment: III - A patient with severe                            systemic disease. After reviewing the risks and  benefits, the patient was deemed in satisfactory                            condition to undergo the procedure.                           - Sedation was administered by an anesthesia                            professional. General anesthesia was attained.                           After obtaining informed consent, the scope was                             passed under direct vision. Throughout the                            procedure, the patient's blood pressure, pulse, and                            oxygen saturations were monitored continuously. The                            [id number]-[id number] ([id number] Olympus duodenoscope was                            introduced through the mouth, and used to inject                            contrast into and used to inject contrast into the                            bile duct. The ERCP was technically difficult and                            complex. The patient tolerated the procedure well. Scope In: Scope Out: Findings:      One plastic stent originating in the common bile duct was emerging from       the major papilla. The stent was visibly patent. A short 0.035 inch Soft       Jagwire was passed into the biliary tree. A 7 mm biliary sphincterotomy       was made with a traction (standard) sphincterotome using ERBE       electrocautery. There was no post-sphincterotomy bleeding. One stent was       removed from the common bile duct using a snare. Two 7 Fr by 7 cm       plastic stents with a single external flap and a single internal flap       were placed 6 cm into the common bile duct. Clear fluid flowed through       the stents. The stents were in good position.      With gross visualization the patient's prior stent migrated distally.       The stent was removed with a snare. Cannulation of  the CBD was performed       with mild to moderate technical difficulty. Contrast injection revealed       a dilated CBD and intrahepatics. The prior sphincterotomy was extended.       Two guidewires were placed as the goal was to place to 7 Fr x 7 cm       stents. The first stent went in with ease and there was rapid drainage       of the contrast material. While deploying the second stent, the intial       stent was starting to migrate proximally. An attempt to withdraw the       second stent resulted in a  failure of the introducer. With repositioning       the second stent was deployed in the duodenal lumen. A rat-toothed       forcep was used to pull the stent distally. This required several       attempts as the stent consistently migrated back proximally. In       anticipation of this issue, the second stent was readied for deployment       at the suction cap after pulling the inital stent distally with the       rat-toothed forcep. With the initial stent touching the opposite wall of       the duodenum, the second stent was inserted. With the insertion both       stents were able to move proximally into the CBD in concert. No futher       adjustments were needed after the deployment of the second stent.       Fluoroscopic imaging showed excellent placement of the stents. Impression:               - One visibly patent stent from the common bile                            duct was seen in the major papilla.                           - A biliary sphincterotomy was performed.                           - One stent was removed from the common bile duct.                           - Two plastic stents were placed into the common                            bile duct. Moderate Sedation:      Not Applicable - Patient had care per Anesthesia. Recommendation:           - Return patient to hospital ward for ongoing care.                           - Resume regular diet.                           - Follow liver panel.                           -  Stent removal versus exchange in 3 months.                           - Follow up in the office in 2-4 weeks upon                            discharge.                           - [facility] will round this weekend. Procedure Code(s):        --- Professional ---                           [phone], Endoscopic retrograde                            cholangiopancreatography (ERCP); with removal and                            exchange of stent(s), biliary or  pancreatic duct,                            including pre- and post-dilation and guide wire                            passage, when performed, including sphincterotomy,                            when performed, each stent exchanged                           [id number], 59, Endoscopic retrograde                            cholangiopancreatography (ERCP); with removal and                            exchange of stent(s), biliary or pancreatic duct,                            including pre- and post-dilation and guide wire                            passage, when performed, including sphincterotomy,                            when performed, each stent exchanged Diagnosis Code(s):        --- Professional ---                           K83.1, Obstruction of bile duct                           Z96.89, Presence of other specified functional  implants                           Z46.59, Encounter for fitting and adjustment of                            other gastrointestinal appliance and device CPT copyright 2022 American Medical Association. All rights reserved. The codes documented in this report are preliminary and upon coder review may  be revised to meet current compliance requirements. Jeani Hawking, MD Jeani Hawking, MD 05/18/2023 3:48:04 PM This report has been signed electronically. Number of Addenda: 0

## 2023-05-18 NOTE — Anesthesia Postprocedure Evaluation (Signed)
Anesthesia Post Note  Patient: Lucino Rash  Procedure(s) Performed: ENDOSCOPIC RETROGRADE CHOLANGIOPANCREATOGRAPHY (ERCP) STENT REMOVAL SPHINCTEROTOMY BILIARY STENT PLACEMENT     Patient location during evaluation: PACU Anesthesia Type: General Level of consciousness: awake and alert, oriented and patient cooperative Pain management: pain level controlled Vital Signs Assessment: post-procedure vital signs reviewed and stable Respiratory status: spontaneous breathing, nonlabored ventilation and respiratory function stable Cardiovascular status: blood pressure returned to baseline and stable Postop Assessment: no apparent nausea or vomiting Anesthetic complications: no   No notable events documented.  Last Vitals:  Vitals:   05/18/23 1544 05/18/23 1550  BP: (!) 126/58 (!) 124/58  Pulse: (!) 59 61  Resp: (!) 23 13  Temp: (!) (P) 36.3 C   SpO2: 100% 100%    Last Pain:  Vitals:   05/18/23 1544  TempSrc: (P) Temporal  PainSc: (P) 0-No pain                 Tennis Must Dornell Grasmick

## 2023-05-18 NOTE — Progress Notes (Signed)
Per Dr. Truett Perna: Needs lab/flush/OV week of 1/6 with him or NP. Lab orders placed and scheduling message sent.

## 2023-05-19 DIAGNOSIS — Z794 Long term (current) use of insulin: Secondary | ICD-10-CM

## 2023-05-19 DIAGNOSIS — C801 Malignant (primary) neoplasm, unspecified: Secondary | ICD-10-CM | POA: Diagnosis not present

## 2023-05-19 DIAGNOSIS — R7989 Other specified abnormal findings of blood chemistry: Secondary | ICD-10-CM

## 2023-05-19 DIAGNOSIS — D509 Iron deficiency anemia, unspecified: Secondary | ICD-10-CM | POA: Diagnosis not present

## 2023-05-19 DIAGNOSIS — K831 Obstruction of bile duct: Secondary | ICD-10-CM | POA: Diagnosis not present

## 2023-05-19 DIAGNOSIS — C189 Malignant neoplasm of colon, unspecified: Secondary | ICD-10-CM | POA: Diagnosis not present

## 2023-05-19 DIAGNOSIS — E119 Type 2 diabetes mellitus without complications: Secondary | ICD-10-CM

## 2023-05-19 LAB — CBC WITH DIFFERENTIAL/PLATELET
Abs Immature Granulocytes: 0.07 10*3/uL (ref 0.00–0.07)
Basophils Absolute: 0.1 10*3/uL (ref 0.0–0.1)
Basophils Relative: 1 %
Eosinophils Absolute: 0.3 10*3/uL (ref 0.0–0.5)
Eosinophils Relative: 4 %
HCT: 24.4 % — ABNORMAL LOW (ref 39.0–52.0)
Hemoglobin: 7.8 g/dL — ABNORMAL LOW (ref 13.0–17.0)
Immature Granulocytes: 1 %
Lymphocytes Relative: 12 %
Lymphs Abs: 0.8 10*3/uL (ref 0.7–4.0)
MCH: 30.7 pg (ref 26.0–34.0)
MCHC: 32 g/dL (ref 30.0–36.0)
MCV: 96.1 fL (ref 80.0–100.0)
Monocytes Absolute: 0.8 10*3/uL (ref 0.1–1.0)
Monocytes Relative: 11 %
Neutro Abs: 5.2 10*3/uL (ref 1.7–7.7)
Neutrophils Relative %: 71 %
Platelets: 176 10*3/uL (ref 150–400)
RBC: 2.54 MIL/uL — ABNORMAL LOW (ref 4.22–5.81)
RDW: 21.5 % — ABNORMAL HIGH (ref 11.5–15.5)
WBC: 7.2 10*3/uL (ref 4.0–10.5)
nRBC: 0 % (ref 0.0–0.2)

## 2023-05-19 LAB — COMPREHENSIVE METABOLIC PANEL
ALT: 150 U/L — ABNORMAL HIGH (ref 0–44)
AST: 117 U/L — ABNORMAL HIGH (ref 15–41)
Albumin: 2 g/dL — ABNORMAL LOW (ref 3.5–5.0)
Alkaline Phosphatase: 1195 U/L — ABNORMAL HIGH (ref 38–126)
Anion gap: 8 (ref 5–15)
BUN: 56 mg/dL — ABNORMAL HIGH (ref 6–20)
CO2: 13 mmol/L — ABNORMAL LOW (ref 22–32)
Calcium: 8 mg/dL — ABNORMAL LOW (ref 8.9–10.3)
Chloride: 109 mmol/L (ref 98–111)
Creatinine, Ser: 5.36 mg/dL — ABNORMAL HIGH (ref 0.61–1.24)
GFR, Estimated: 12 mL/min — ABNORMAL LOW (ref >60)
Glucose, Bld: 170 mg/dL — ABNORMAL HIGH (ref 70–99)
Potassium: 4.5 mmol/L (ref 3.5–5.1)
Sodium: 130 mmol/L — ABNORMAL LOW (ref 135–145)
Total Bilirubin: 13.7 mg/dL — ABNORMAL HIGH (ref <1.2)
Total Protein: 5.7 g/dL — ABNORMAL LOW (ref 6.5–8.1)

## 2023-05-19 LAB — GLUCOSE, CAPILLARY
Glucose-Capillary: 165 mg/dL — ABNORMAL HIGH (ref 70–99)
Glucose-Capillary: 154 mg/dL — ABNORMAL HIGH (ref 70–99)
Glucose-Capillary: 85 mg/dL (ref 70–99)
Glucose-Capillary: 124 mg/dL — ABNORMAL HIGH (ref 70–99)

## 2023-05-19 LAB — MAGNESIUM: Magnesium: 1.9 mg/dL (ref 1.7–2.4)

## 2023-05-19 MED ORDER — LACTATED RINGERS IV SOLN: INTRAVENOUS | Status: AC

## 2023-05-19 NOTE — Progress Notes (Signed)
PROGRESS NOTE    Jeffrey Young  NGE:[id number] DOB: 01-09-1966 DOA: 05/07/2023 PCP: Tracey Harries, MD    Brief Narrative:  57 year old gentleman with stage III colon cancer, type 2 diabetes, left foot wound, CKD stage IV, hypertension presented with bloody urine however he was noted to have jaundiced with normal blood in the urine.  Bilirubin was 8.  Mildly elevated creatinine from his baseline.  Imaging suggested possible cholecystitis. Underwent ERCP on 12/19, bilirubin is still rising and repeat ERCP on 12/27.  Subjective: Patient seen and examined.  Wife was at the bedside.  Denies any complaints except body itching ongoing but slightly better after starting cholestyramine. Creatinine is elevated to 5.36, patient is aware about abnormal kidney function but not to this extent.  He has never seen a nephrologist before.  Assessment & Plan:   Obstructive jaundice due to malignancy: 12/19, status post ERCP that showed CBD obstruction due to mass, stenting done previous and FNA of the lymph nodes.  Pathology consistent with adenocarcinoma with probable: As primary. LFTs continued to rise, MRCP showed possible biliary stent migration or occlusion.  Underwent repeat ERCP with stenting.  Bilirubin level is stable or slight downtrend today.  Recheck tomorrow morning.  This is likely metastatic colon cancer, oncology is following.  AKI on CKD stage IV with metabolic acidosis: Recent known creatinine of 3.5.  Admitted with creatinine of 5.  Remains elevated and fluctuating.  Today 5.36.  There is no evidence of hydronephrosis on the CT scan.  Will place patient on gentle IV fluids today.  Monitor closely.  Anemia of chronic disease: Received total 2 units of PRBC.  Hemoglobin 7.8.  Will continue to monitor.   Hypertension: Blood pressure stable on carvedilol and amlodipine.  Type 2 diabetes: On Lantus and sliding scale.  Stable.  Left foot wound, present on admission: Dressing instructions  attached.  Recheck labs tomorrow.  IV fluids.  Can transfer to MedSurg bed.   DVT prophylaxis: SCDs Start: 05/08/23 0225   Code Status: Full code Family Communication: Wife at the bedside. Disposition Plan: Status is: Inpatient Remains inpatient appropriate because: IV fluids.  Monitoring of electrolytes and liver test.     Consultants:  Gastroenterology  Procedures:  ERCP 12/19 ERCP 12/27  Antimicrobials:  None     Objective: Vitals:   05/18/23 1630 05/18/23 2100 05/19/23 0104 05/19/23 0432  BP: 130/75 121/62 130/75 126/73  Pulse: (!) 55 60 73 70  Resp: 15 17 18 18   Temp: (!) 97.5 F (36.4 C) 98 F (36.7 C) 97.8 F (36.6 C) 97.8 F (36.6 C)  TempSrc: Axillary Oral Oral Oral  SpO2: 98% 98% 98% 97%  Weight:      Height:        Intake/Output Summary (Last 24 hours) at 05/19/2023 1038 Last data filed at 05/18/2023 1220 Gross per 24 hour  Intake 310 ml  Output --  Net 310 ml   Filed Weights   05/07/23 0933 05/10/23 1115  Weight: 87 kg 87 kg    Examination:  General exam: Appears calm and comfortable. Icteric. Respiratory system: Clear to auscultation. Respiratory effort normal. Cardiovascular system: S1 & S2 heard,  Gastrointestinal system: Soft.  Nontender.  Bowel sound present. Central nervous system: Alert and oriented. No focal neurological deficits. Extremities: Symmetric 5 x 5 power.    Data Reviewed: I have personally reviewed following labs and imaging studies  CBC: Recent Labs  Lab 05/15/23 0112 05/17/23 0256 05/18/23 0319 05/18/23 1239 05/18/23 1732 05/19/23 0340  WBC 6.9 6.5 5.9  --   --  7.2  NEUTROABS  --   --   --   --   --  5.2  HGB 8.0* 7.1* 6.7* 9.2* 7.5* 7.8*  HCT 25.0* 21.4* 20.6* 27.0* 22.4* 24.4*  MCV 95.1 94.3 94.9  --   --  96.1  PLT 238 189 187  --   --  176   Basic Metabolic Panel: Recent Labs  Lab 05/15/23 0112 05/16/23 0316 05/17/23 0256 05/18/23 0319 05/18/23 1239 05/19/23 0340  NA 131* 128* 128*  132* 138 130*  K 4.0 3.9 4.0 4.1 4.6 4.5  CL 108 106 107 108 114* 109  CO2 16* 15* 14* 15*  --  13*  GLUCOSE 119* 133* 94 130* 109* 170*  BUN 44* 45* 50* 49* 43* 56*  CREATININE 4.30* 4.37* 4.37* 4.46* 5.70* 5.36*  CALCIUM 8.0* 7.7* 7.9* 8.0*  --  8.0*  MG  --   --   --   --   --  1.9   GFR: Estimated Creatinine Clearance: 16.8 mL/min (A) (by C-G formula based on SCr of 5.36 mg/dL (H)). Liver Function Tests: Recent Labs  Lab 05/15/23 0112 05/16/23 0316 05/17/23 0256 05/18/23 0319 05/19/23 0340  AST 189* 167* 151* 147* 117*  ALT 158* 154* 149* 151* 150*  ALKPHOS 1,136* 1,060* 1,068* 1,103* 1,195*  BILITOT 12.5* 12.6* 14.5* 14.5* 13.7*  PROT 6.0* 5.5* 5.5* 5.5* 5.7*  ALBUMIN 2.0* 1.9* 2.1* 1.9* 2.0*   No results for input(s): "LIPASE", "AMYLASE" in the last 168 hours. No results for input(s): "AMMONIA" in the last 168 hours. Coagulation Profile: No results for input(s): "INR", "PROTIME" in the last 168 hours. Cardiac Enzymes: No results for input(s): "CKTOTAL", "CKMB", "CKMBINDEX", "TROPONINI" in the last 168 hours. BNP (last 3 results) No results for input(s): "PROBNP" in the last 8760 hours. HbA1C: No results for input(s): "HGBA1C" in the last 72 hours. CBG: Recent Labs  Lab 05/18/23 0813 05/18/23 1149 05/18/23 1652 05/18/23 2148 05/19/23 0724  GLUCAP 119* 108* 182* 148* 165*   Lipid Profile: No results for input(s): "CHOL", "HDL", "LDLCALC", "TRIG", "CHOLHDL", "LDLDIRECT" in the last 72 hours. Thyroid Function Tests: No results for input(s): "TSH", "T4TOTAL", "FREET4", "T3FREE", "THYROIDAB" in the last 72 hours. Anemia Panel: No results for input(s): "VITAMINB12", "FOLATE", "FERRITIN", "TIBC", "IRON", "RETICCTPCT" in the last 72 hours. Sepsis Labs: No results for input(s): "PROCALCITON", "LATICACIDVEN" in the last 168 hours.  No results found for this or any previous visit (from the past 240 hours).       Radiology Studies: DG ERCP Result Date:  05/18/2023 CLINICAL DATA:  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 EXAM: ERCP TECHNIQUE: Multiple spot images obtained with the fluoroscopic device and submitted for interpretation post-procedure. COMPARISON:  Prior ERCP on 05/10/2023 FINDINGS: Imaging with a C-arm demonstrates cannulation of the common bile duct, are retrieval of a pre-existing endoscopic biliary stent and placement new biliary stents in the common bile duct. IMPRESSION: Imaging during endoscopic common bile duct stent retrieval and replacement. These images were submitted for radiologic interpretation only. Please see the procedural report for the amount of contrast and the fluoroscopy time utilized. Electronically Signed   By: Irish Lack M.D.   On: 05/18/2023 17:17        Scheduled Meds:  amLODipine  5 mg Oral Daily   carvedilol  12.5 mg Oral BID WC   Chlorhexidine Gluconate Cloth  6 each Topical Daily  cholestyramine light  4 g Oral BID   insulin aspart  0-6 Units Subcutaneous TID WC   insulin glargine-yfgn  10 Units Subcutaneous BID   multivitamin with minerals  1 tablet Oral Daily   pantoprazole  40 mg Oral Daily   sodium chloride flush  10-40 mL Intracatheter Q12H   Continuous Infusions:  lactated ringers 40 mL/hr at 05/19/23 0942     LOS: 12 days    Time spent: 35 minutes    Dorcas Carrow, MD [facility]

## 2023-05-19 NOTE — Plan of Care (Signed)

## 2023-05-19 NOTE — Progress Notes (Addendum)
  GI Progress Note Covering for Drs. Mann & Hung   Assessment    Obstructive jaundice likely secondary to metastatic lymphadenopathy. ERCP with biliary stent exchange yesterday, 2 stents placed. LFTs stable today  Stage III colon cancer  CKD with AKI, Cr rising Anemia  DM   Recommendations   Trend LFTs over next few weeks GI signing off  Outpatient GI follow up with Dr. Elnoria Howard in 1-2 months   Chief Complaint   Feels well. Stable post ERCP yesterday. Worsening renal function noted  Vital signs in last 24 hours: Temp:  [97.2 F (36.2 C)-98.6 F (37 C)] 97.8 F (36.6 C) (12/28 0432) Pulse Rate:  [55-73] 70 (12/28 0432) Resp:  [11-23] 18 (12/28 0432) BP: (121-164)/(58-81) 126/73 (12/28 0432) SpO2:  [92 %-100 %] 97 % (12/28 0432) Last BM Date : 05/16/23  General: Alert, well-developed, in NAD Heart:  Regular rate and rhythm; no murmurs Chest: Clear to ascultation bilaterally Abdomen:  Soft, nontender and nondistended. Normal bowel sounds, without guarding, and without rebound.   Extremities:  Without edema. Neurologic:  Alert and  oriented x4; grossly normal neurologically. Psych:  Alert and cooperative. Normal mood and affect.  Intake/Output from previous day: 12/27 0701 - 12/28 0700 In: 310 [Blood:310] Out: -  Intake/Output this shift: No intake/output data recorded.  Lab Results: Recent Labs    05/17/23 0256 05/18/23 0319 05/18/23 1239 05/18/23 1732 05/19/23 0340  WBC 6.5 5.9  --   --  7.2  HGB 7.1* 6.7* 9.2* 7.5* 7.8*  HCT 21.4* 20.6* 27.0* 22.4* 24.4*  PLT 189 187  --   --  176   BMET Recent Labs    05/17/23 0256 05/18/23 0319 05/18/23 1239 05/19/23 0340  NA 128* 132* 138 130*  K 4.0 4.1 4.6 4.5  CL 107 108 114* 109  CO2 14* 15*  --  13*  GLUCOSE 94 130* 109* 170*  BUN 50* 49* 43* 56*  CREATININE 4.37* 4.46* 5.70* 5.36*  CALCIUM 7.9* 8.0*  --  8.0*   LFT Recent Labs    05/19/23 0340  PROT 5.7*  ALBUMIN 2.0*  AST 117*  ALT 150*   ALKPHOS 1,195*  BILITOT 13.7*    Studies/Results: DG ERCP Result Date: 05/18/2023 CLINICAL DATA:  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 EXAM: ERCP TECHNIQUE: Multiple spot images obtained with the fluoroscopic device and submitted for interpretation post-procedure. COMPARISON:  Prior ERCP on 05/10/2023 FINDINGS: Imaging with a C-arm demonstrates cannulation of the common bile duct, are retrieval of a pre-existing endoscopic biliary stent and placement new biliary stents in the common bile duct. IMPRESSION: Imaging during endoscopic common bile duct stent retrieval and replacement. These images were submitted for radiologic interpretation only. Please see the procedural report for the amount of contrast and the fluoroscopy time utilized. Electronically Signed   By: Irish Lack M.D.   On: 05/18/2023 17:17     LOS: 12 days   Juanangel Soderholm T. Russella Dar, MD 05/19/2023, 9:15 AM See [facility], to contact our on call provider

## 2023-05-20 ENCOUNTER — Encounter (HOSPITAL_COMMUNITY): Payer: Self-pay | Admitting: Gastroenterology

## 2023-05-20 DIAGNOSIS — R7989 Other specified abnormal findings of blood chemistry: Secondary | ICD-10-CM | POA: Diagnosis not present

## 2023-05-20 DIAGNOSIS — K831 Obstruction of bile duct: Secondary | ICD-10-CM | POA: Diagnosis not present

## 2023-05-20 DIAGNOSIS — C801 Malignant (primary) neoplasm, unspecified: Secondary | ICD-10-CM | POA: Diagnosis not present

## 2023-05-20 LAB — URINALYSIS, COMPLETE (UACMP) WITH MICROSCOPIC
Bacteria, UA: NONE SEEN
Bilirubin Urine: NEGATIVE
Glucose, UA: 150 mg/dL — AB
Ketones, ur: NEGATIVE mg/dL
Leukocytes,Ua: NEGATIVE
Nitrite: NEGATIVE
Protein, ur: >=300 mg/dL — AB
Specific Gravity, Urine: 1.011 (ref 1.005–1.030)
pH: 5 (ref 5.0–8.0)

## 2023-05-20 LAB — COMPREHENSIVE METABOLIC PANEL
ALT: 110 U/L — ABNORMAL HIGH (ref 0–44)
AST: 57 U/L — ABNORMAL HIGH (ref 15–41)
Albumin: 2 g/dL — ABNORMAL LOW (ref 3.5–5.0)
Alkaline Phosphatase: 965 U/L — ABNORMAL HIGH (ref 38–126)
Anion gap: 10 (ref 5–15)
BUN: 59 mg/dL — ABNORMAL HIGH (ref 6–20)
CO2: 12 mmol/L — ABNORMAL LOW (ref 22–32)
Calcium: 8.1 mg/dL — ABNORMAL LOW (ref 8.9–10.3)
Chloride: 112 mmol/L — ABNORMAL HIGH (ref 98–111)
Creatinine, Ser: 5.89 mg/dL — ABNORMAL HIGH (ref 0.61–1.24)
GFR, Estimated: 10 mL/min — ABNORMAL LOW (ref >60)
Glucose, Bld: 74 mg/dL (ref 70–99)
Potassium: 4.7 mmol/L (ref 3.5–5.1)
Sodium: 134 mmol/L — ABNORMAL LOW (ref 135–145)
Total Bilirubin: 6.9 mg/dL — ABNORMAL HIGH (ref <1.2)
Total Protein: 5.5 g/dL — ABNORMAL LOW (ref 6.5–8.1)

## 2023-05-20 LAB — CBC WITH DIFFERENTIAL/PLATELET
Abs Immature Granulocytes: 0.06 10*3/uL (ref 0.00–0.07)
Basophils Absolute: 0.1 10*3/uL (ref 0.0–0.1)
Basophils Relative: 1 %
Eosinophils Absolute: 0.3 10*3/uL (ref 0.0–0.5)
Eosinophils Relative: 5 %
HCT: 23.8 % — ABNORMAL LOW (ref 39.0–52.0)
Hemoglobin: 7.3 g/dL — ABNORMAL LOW (ref 13.0–17.0)
Immature Granulocytes: 1 %
Lymphocytes Relative: 16 %
Lymphs Abs: 1.1 10*3/uL (ref 0.7–4.0)
MCH: 30.2 pg (ref 26.0–34.0)
MCHC: 30.7 g/dL (ref 30.0–36.0)
MCV: 98.3 fL (ref 80.0–100.0)
Monocytes Absolute: 0.7 10*3/uL (ref 0.1–1.0)
Monocytes Relative: 10 %
Neutro Abs: 4.8 10*3/uL (ref 1.7–7.7)
Neutrophils Relative %: 67 %
Platelets: 171 10*3/uL (ref 150–400)
RBC: 2.42 MIL/uL — ABNORMAL LOW (ref 4.22–5.81)
RDW: 21.3 % — ABNORMAL HIGH (ref 11.5–15.5)
WBC: 7.1 10*3/uL (ref 4.0–10.5)
nRBC: 0 % (ref 0.0–0.2)

## 2023-05-20 LAB — GLUCOSE, CAPILLARY
Glucose-Capillary: 51 mg/dL — ABNORMAL LOW (ref 70–99)
Glucose-Capillary: 56 mg/dL — ABNORMAL LOW (ref 70–99)
Glucose-Capillary: 62 mg/dL — ABNORMAL LOW (ref 70–99)
Glucose-Capillary: 97 mg/dL (ref 70–99)
Glucose-Capillary: 170 mg/dL — ABNORMAL HIGH (ref 70–99)
Glucose-Capillary: 111 mg/dL — ABNORMAL HIGH (ref 70–99)
Glucose-Capillary: 161 mg/dL — ABNORMAL HIGH (ref 70–99)

## 2023-05-20 LAB — PROTEIN / CREATININE RATIO, URINE
Creatinine, Urine: 69 mg/dL
Protein Creatinine Ratio: 5.36 mg/mg{creat} — ABNORMAL HIGH (ref 0.00–0.15)
Total Protein, Urine: 370 mg/dL

## 2023-05-20 LAB — MAGNESIUM: Magnesium: 1.9 mg/dL (ref 1.7–2.4)

## 2023-05-20 MED ORDER — INSULIN GLARGINE-YFGN 100 UNIT/ML ~~LOC~~ SOLN
6.0000 [IU] | Freq: Two times a day (BID) | SUBCUTANEOUS | Status: DC
Start: 2023-05-20 — End: 2023-05-22
  Administered 2023-05-20 – 2023-05-21 (×3): 6 [IU] via SUBCUTANEOUS
  Filled 2023-05-20 (×5): qty 0.06

## 2023-05-20 NOTE — Progress Notes (Signed)
CBG 56. Given 4 oz juice. CBG 51. Given 8 oz juice. CBG 62. Given 4 oz of juice and breakfast. CBG 97.

## 2023-05-20 NOTE — Plan of Care (Signed)

## 2023-05-20 NOTE — Progress Notes (Signed)
PROGRESS NOTE    Jeffrey Young  ZOX:[id number] DOB: [birth year] DOA: 05/07/2023 PCP: Tracey Harries, MD    Brief Narrative:  57 year old gentleman with stage III colon cancer, type 2 diabetes, left foot wound, CKD stage IV, hypertension presented with bloody urine however he was noted to have jaundiced with no blood in the urine.  Bilirubin was 8.  Mildly elevated creatinine from his baseline.  Imaging suggested possible cholecystitis. Underwent ERCP on 12/19, bilirubin is still rising and repeat ERCP on 12/27. LFTs improving, however has renal function tests not improving.  Subjective:  Patient seen and examined.  Denies any complaints.  He is eating regular diet.  Had a hypoglycemic episode that is corrected. Bilirubin down to 6.9 Creatinine 5.89, bicarb is 12. Case discussed with nephrology for consultation.  Urine output 450 mL last 24 hours?.  Will ask to record strictly.  Assessment & Plan:   Obstructive jaundice due to malignancy: 12/19, status post ERCP that showed CBD obstruction due to mass, stenting done previous and FNA of the lymph nodes.  Pathology consistent with adenocarcinoma with colon cancer as primary.  LFTs continued to rise, MRCP showed possible biliary stent migration or occlusion.  Underwent repeat ERCP with stenting.  Bilirubin level is stable or downtrending now.   This is likely metastatic colon cancer, oncology is following.  AKI on CKD stage IV with metabolic acidosis: Recent known creatinine of 3.5.  Admitted with creatinine of 5.  Remains elevated and fluctuating.  Today 5.9.  There is no evidence of hydronephrosis on the CT scan.  On LR 40 mL/h. Urine output 450 mL last 24 hours.  No evidence of uremia.  Potassium is normal. Consulted nephrology as patient is not appropriately improving on conservative management.  Anemia of chronic disease: Received total 2 units of PRBC.  Hemoglobin 7.3.  Will continue to monitor.   Hypertension: Blood pressure  stable on carvedilol and amlodipine.  Type 2 diabetes with hypoglycemia.  Asymptomatic.  This is probably due to poor clearance of insulin.  Will decrease dose of long-acting insulin to 6 units twice daily from 10 units., next dose tonight.  Left foot wound, previous left TMA.  Present on admission: Dressing instructions attached.  DVT prophylaxis: SCDs Start: 05/08/23 0225   Code Status: Full code Family Communication: None today. Disposition Plan: Status is: Inpatient Remains inpatient appropriate because: IV fluids.  Monitoring of renal function test.     Consultants:  Gastroenterology Nephrology.  Procedures:  ERCP 12/19 ERCP 12/27  Antimicrobials:  None     Objective: Vitals:   05/19/23 0432 05/19/23 1314 05/19/23 1948 05/20/23 0516  BP: 126/73 (!) 155/82 (!) 146/86 (!) 143/78  Pulse: 70 68 64 64  Resp: 18 19 18 18   Temp: 97.8 F (36.6 C) 98 F (36.7 C) 97.8 F (36.6 C) 97.9 F (36.6 C)  TempSrc: Oral Oral Oral Oral  SpO2: 97% 97% 98% 100%  Weight:      Height:        Intake/Output Summary (Last 24 hours) at 05/20/2023 1100 Last data filed at 05/20/2023 0900 Gross per 24 hour  Intake 578.23 ml  Output 850 ml  Net -271.77 ml   Filed Weights   05/07/23 0933 05/10/23 1115  Weight: 87 kg 87 kg    Examination:  General exam: Appears calm and comfortable. Icteric.  Eating breakfast. Respiratory system: Clear to auscultation. Respiratory effort normal. Cardiovascular system: S1 & S2 heard,  Gastrointestinal system: Soft.  Nontender.  Bowel sound present. Central  nervous system: Alert and oriented. No focal neurological deficits. Extremities: Symmetric 5 x 5 power. Patient has left foot TMA wound on dry dressing.    Data Reviewed: I have personally reviewed following labs and imaging studies  CBC: Recent Labs  Lab 05/15/23 0112 05/17/23 0256 05/18/23 0319 05/18/23 1239 05/18/23 1732 05/19/23 0340 05/20/23 0307  WBC 6.9 6.5 5.9  --   --   7.2 7.1  NEUTROABS  --   --   --   --   --  5.2 4.8  HGB 8.0* 7.1* 6.7* 9.2* 7.5* 7.8* 7.3*  HCT 25.0* 21.4* 20.6* 27.0* 22.4* 24.4* 23.8*  MCV 95.1 94.3 94.9  --   --  96.1 98.3  PLT 238 189 187  --   --  176 171   Basic Metabolic Panel: Recent Labs  Lab 05/16/23 0316 05/17/23 0256 05/18/23 0319 05/18/23 1239 05/19/23 0340 05/20/23 0307  NA 128* 128* 132* 138 130* 134*  K 3.9 4.0 4.1 4.6 4.5 4.7  CL 106 107 108 114* 109 112*  CO2 15* 14* 15*  --  13* 12*  GLUCOSE 133* 94 130* 109* 170* 74  BUN 45* 50* 49* 43* 56* 59*  CREATININE 4.37* 4.37* 4.46* 5.70* 5.36* 5.89*  CALCIUM 7.7* 7.9* 8.0*  --  8.0* 8.1*  MG  --   --   --   --  1.9 1.9   GFR: Estimated Creatinine Clearance: 15.3 mL/min (A) (by C-G formula based on SCr of 5.89 mg/dL (H)). Liver Function Tests: Recent Labs  Lab 05/16/23 0316 05/17/23 0256 05/18/23 0319 05/19/23 0340 05/20/23 0307  AST 167* 151* 147* 117* 57*  ALT 154* 149* 151* 150* 110*  ALKPHOS 1,060* 1,068* 1,103* 1,195* 965*  BILITOT 12.6* 14.5* 14.5* 13.7* 6.9*  PROT 5.5* 5.5* 5.5* 5.7* 5.5*  ALBUMIN 1.9* 2.1* 1.9* 2.0* 2.0*   No results for input(s): "LIPASE", "AMYLASE" in the last 168 hours. No results for input(s): "AMMONIA" in the last 168 hours. Coagulation Profile: No results for input(s): "INR", "PROTIME" in the last 168 hours. Cardiac Enzymes: No results for input(s): "CKTOTAL", "CKMB", "CKMBINDEX", "TROPONINI" in the last 168 hours. BNP (last 3 results) No results for input(s): "PROBNP" in the last 8760 hours. HbA1C: No results for input(s): "HGBA1C" in the last 72 hours. CBG: Recent Labs  Lab 05/19/23 2105 05/20/23 0735 05/20/23 0801 05/20/23 0819 05/20/23 0844  GLUCAP 124* 56* 51* 62* 97   Lipid Profile: No results for input(s): "CHOL", "HDL", "LDLCALC", "TRIG", "CHOLHDL", "LDLDIRECT" in the last 72 hours. Thyroid Function Tests: No results for input(s): "TSH", "T4TOTAL", "FREET4", "T3FREE", "THYROIDAB" in the last 72  hours. Anemia Panel: No results for input(s): "VITAMINB12", "FOLATE", "FERRITIN", "TIBC", "IRON", "RETICCTPCT" in the last 72 hours. Sepsis Labs: No results for input(s): "PROCALCITON", "LATICACIDVEN" in the last 168 hours.  No results found for this or any previous visit (from the past 240 hours).       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 EXAM: ERCP TECHNIQUE: Multiple spot images obtained with the fluoroscopic device and submitted for interpretation post-procedure. COMPARISON:  Prior ERCP on 05/10/2023 FINDINGS: Imaging with a C-arm demonstrates cannulation of the common bile duct, are retrieval of a pre-existing endoscopic biliary stent and placement new biliary stents in the common bile duct. IMPRESSION: Imaging during endoscopic common bile duct stent retrieval and replacement. These images were submitted for radiologic interpretation only. Please see  the procedural report for the amount of contrast and the fluoroscopy time utilized. Electronically Signed   By: Irish Lack M.D.   On: 05/18/2023 17:17        Scheduled Meds:  amLODipine  5 mg Oral Daily   carvedilol  12.5 mg Oral BID WC   Chlorhexidine Gluconate Cloth  6 each Topical Daily   cholestyramine light  4 g Oral BID   insulin aspart  0-6 Units Subcutaneous TID WC   insulin glargine-yfgn  6 Units Subcutaneous BID   multivitamin with minerals  1 tablet Oral Daily   pantoprazole  40 mg Oral Daily   sodium chloride flush  10-40 mL Intracatheter Q12H   Continuous Infusions:     LOS: 13 days    Time spent: 35 minutes    Dorcas Carrow, MD [facility]

## 2023-05-20 NOTE — Consult Note (Signed)
[facility] Renal Consultation Note  Requesting MD: Ghimire Indication for Consultation: [id number]   HPI:  Jeffrey Young is a 57 y.o. male with a past medical history significant for stage IIIc colon cancer managed by Dr. Blima Dessert post chemotherapy, still felt to have active disease but stable, DM, foot wound, hypertension.  He also has chronic kidney disease which seems to date back to 2019.  In November 22 creatinine was 1.56.  However in 2023 was more in the high twos up to 3.  The 3 values that we have from 2024 are in the low to mid threes.  He presented to medical attention on 12/16 for unclear reasons but seems like urinary complaints.  Was found to be jaundiced with elevated bilirubin, found to have a CBD obstruction..  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  Regarding renal function, upon presentation creatinine was 5.  Improved to 3.3 on 12/20 but since then has slowly increased and is at a level of 5.89 today and that is the reason for consult.  Urine output has not been recorded but patient states that it has been normal.  He denies any current urinary symptoms.  He actually denies any symptoms at all.  He has not had nausea or vomiting.  He is ambulatory in the room and feels like he is ready to be discharged.  There have been no overt low blood pressures that I have seen.  Urinalysis on 12/16 did show greater than 300 of protein but no cells.  I do not see any nephrotoxins and his hospital history-has had at least 1 contrasted CT, most recent on 12/27  Creatinine  Date/Time Value Ref Range Status  11/06/2022 08:14 AM 3.53 (H) 0.61 - 1.24 mg/dL Final  40/98/1191 47:82 AM 3.11 (H) 0.61 - 1.24 mg/dL Final  95/62/1308 65:78 AM 3.17 (H) 0.61 - 1.24 mg/dL Final  46/96/2952 84:13 AM 2.94  (H) 0.61 - 1.24 mg/dL Final  24/40/1027 25:36 AM 3.01 (HH) 0.61 - 1.24 mg/dL Final    Comment:    CRITICAL RESULT CALLED TO, READ BACK BY AND VERIFIED WITH: CT SUSAN COWARD @ 1134. 04/03/2022.klj   11/07/2021 08:31 AM 2.83 (H) 0.61 - 1.24 mg/dL Final  64/40/3474 25:95 PM 1.72 (H) 0.61 - 1.24 mg/dL Final  63/87/5643 32:95 AM 1.82 (H) 0.61 - 1.24 mg/dL Final  18/84/1660 63:01 AM 1.34 (H) 0.61 - 1.24 mg/dL Final  60/02/9322 55:73 AM 1.54 (H) 0.61 - 1.24 mg/dL Final  22/06/5425 06:23 AM 1.34 (H) 0.61 - 1.24 mg/dL Final  76/28/3151 76:16 PM 1.75 (H) 0.61 - 1.24 mg/dL Final  07/37/1062 69:48 AM 1.36 (H) 0.61 - 1.24 mg/dL Final  54/62/7035 00:93 AM 1.58 (H) 0.61 - 1.24 mg/dL Final  81/82/9937 16:96 AM 1.77 (H) 0.61 - 1.24 mg/dL Final  78/93/8101 75:10 AM 1.64 (H) 0.61 - 1.24 mg/dL Final  25/85/2778 24:23 AM 1.79 (H) 0.61 - 1.24 mg/dL Final  53/61/4431 54:00 AM 2.00 (H) 0.61 - 1.24 mg/dL Final  86/76/1950 93:26 AM 2.10 (H) 0.61 - 1.24 mg/dL Final  71/24/5809 98:33 AM 2.21 (H) 0.61 - 1.24 mg/dL Final  82/50/5397 67:34 AM 2.60 (H) 0.61 - 1.24 mg/dL Final  19/37/9024 09:73 AM 2.01 (H) 0.61 - 1.24 mg/dL Final   Creatinine, Ser  Date/Time Value Ref Range Status  05/20/2023 03:07 AM  5.89 (H) 0.61 - 1.24 mg/dL Final  16/02/9603 54:09 AM 5.36 (H) 0.61 - 1.24 mg/dL Final  81/19/1478 29:56 PM 5.70 (H) 0.61 - 1.24 mg/dL Final  21/30/8657 84:69 AM 4.46 (H) 0.61 - 1.24 mg/dL Final  62/95/2841 32:44 AM 4.37 (H) 0.61 - 1.24 mg/dL Final  05/24/7251 66:44 AM 4.37 (H) 0.61 - 1.24 mg/dL Final  03/47/4259 56:38 AM 4.30 (H) 0.61 - 1.24 mg/dL Final  75/64/3329 51:88 AM 4.23 (H) 0.61 - 1.24 mg/dL Final  41/66/0630 16:01 AM 4.71 (H) 0.61 - 1.24 mg/dL Final  09/32/3557 32:20 AM 4.84 (H) 0.61 - 1.24 mg/dL Final  25/42/7062 37:62 PM 4.72 (H) 0.61 - 1.24 mg/dL Final  83/15/1761 60:73 AM 3.37 (H) 0.61 - 1.24 mg/dL Final  71/10/2692 85:46 AM 4.14 (H) 0.61 - 1.24 mg/dL Final  27/07/5007 38:18 AM 4.18 (H) 0.61 - 1.24  mg/dL Final  29/93/7169 67:89 AM 4.75 (H) 0.61 - 1.24 mg/dL Final  38/02/1750 02:58 AM 5.02 (H) 0.61 - 1.24 mg/dL Final  52/77/8242 35:36 AM 1.56 (H) 0.61 - 1.24 mg/dL Final  14/43/1540 08:67 AM 1.76 (H) 0.61 - 1.24 mg/dL Final  61/95/0932 67:12 AM 1.55 (H) 0.61 - 1.24 mg/dL Final  45/80/9983 38:25 AM 1.43 (H) 0.61 - 1.24 mg/dL Final  05/39/7673 41:93 AM 1.39 (H) 0.61 - 1.24 mg/dL Final  79/06/4095 35:32 AM 1.68 (H) 0.61 - 1.24 mg/dL Final  99/24/2683 41:96 AM 1.57 (H) 0.61 - 1.24 mg/dL Final  22/29/7989 21:19 AM 1.52 (H) 0.61 - 1.24 mg/dL Final  41/74/0814 48:18 PM 1.73 (H) 0.61 - 1.24 mg/dL Final  56/31/4970 26:37 AM 1.80 (H) 0.61 - 1.24 mg/dL Final  85/88/5027 74:12 AM 1.92 (H) 0.61 - 1.24 mg/dL Final  87/86/7672 09:47 AM 2.09 (H) 0.61 - 1.24 mg/dL Final  09/62/8366 29:47 PM 2.18 (H) 0.61 - 1.24 mg/dL Final  65/46/5035 46:56 PM 2.20 (H) 0.61 - 1.24 mg/dL Final  81/27/5170 01:74 AM 2.13 (H) 0.61 - 1.24 mg/dL Final  94/49/6759 16:38 AM 2.30 (H) 0.61 - 1.24 mg/dL Final  46/65/9935 70:17 AM 2.55 (H) 0.61 - 1.24 mg/dL Final  79/39/0300 92:33 PM 2.53 (H) 0.61 - 1.24 mg/dL Final  00/76/2263 33:54 AM 2.41 (H) 0.61 - 1.24 mg/dL Final  56/25/6389 37:34 AM 1.81 (H) 0.61 - 1.24 mg/dL Final  28/76/8115 72:62 AM 2.12 (H) 0.61 - 1.24 mg/dL Final  03/55/9741 63:84 AM 1.85 (H) 0.61 - 1.24 mg/dL Final  53/64/6803 21:22 AM 1.65 (H) 0.61 - 1.24 mg/dL Final  48/25/0037 04:88 AM 1.95 (H) 0.61 - 1.24 mg/dL Final  89/16/9450 38:88 AM 1.57 (H) 0.61 - 1.24 mg/dL Final  28/00/3491 79:15 PM 1.64 (H) 0.61 - 1.24 mg/dL Final  05/69/7948 01:65 PM 1.26 (H) 0.61 - 1.24 mg/dL Final  53/74/8270 78:67 AM 1.08 0.61 - 1.24 mg/dL Final  54/49/2010 07:12 AM 1.09 0.61 - 1.24 mg/dL Final  19/75/8832 54:98 AM 1.50 (H) 0.61 - 1.24 mg/dL Final  26/41/5830 94:07 PM 1.20 0.61 - 1.24 mg/dL Final  68/12/8108 31:59 PM 1.31 (H) 0.61 - 1.24 mg/dL Final     PMHx:   Past Medical History:  Diagnosis Date   Arthritis    Hip    Colon cancer (HCC)    Diabetic mononeuropathy associated with type 2 diabetes mellitus (HCC) 03/21/2017   Diabetic retinopathy (HCC) 07/31/2020   Enthesopathy of ankle and tarsus 04/02/2009   Formatting of this note might be different from the original. 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 Type 2 Diabetes Mellitus - Uncomplicated, Uncontrolled    Past Surgical History:  Procedure Laterality Date   AMPUTATION Left 01/22/2018   Procedure: TRANSMETATARSAL AMPUTATION;  Surgeon: Nadara Mustard, MD;  Location: [facility];  Service: Orthopedics;  Laterality: Left;toes   BILIARY STENT PLACEMENT N/A 05/10/2023   Procedure: BILIARY STENT PLACEMENT;  Surgeon: Jeani Hawking, MD;  Location: [facility];  Service: Gastroenterology;  Laterality: N/A;   BILIARY STENT PLACEMENT N/A 05/18/2023   Procedure: BILIARY STENT PLACEMENT;  Surgeon: Jeani Hawking, MD;  Location: [facility];  Service: Gastroenterology;  Laterality: N/A;   BIOPSY  07/27/2020   Procedure: BIOPSY;  Surgeon: Jeani Hawking, MD;  Location: [facility];  Service: Endoscopy;;   COLON RESECTION N/A 07/30/2020   Procedure: HAND ASSISTED LAPAROSCOPIC LEFT HEMI COLECTOMY;  Surgeon: Luretha Murphy, MD;  Location: [facility];  Service: General;  Laterality: N/A;   COLONOSCOPY WITH PROPOFOL N/A  05/24/2018   Procedure: COLONOSCOPY WITH PROPOFOL;  Surgeon: Jeani Hawking, MD;  [facility];  Service: Endoscopy;  Laterality: N/A;   COLONOSCOPY WITH PROPOFOL N/A 07/27/2020   Procedure: COLONOSCOPY WITH PROPOFOL;  Surgeon: Jeani Hawking, MD;  Location: [facility];  Service: Endoscopy;  Laterality: N/A;   ERCP N/A 05/10/2023   Procedure: ENDOSCOPIC RETROGRADE CHOLANGIOPANCREATOGRAPHY (ERCP);  Surgeon: Jeani Hawking, MD;  Location: [facility];  Service: Gastroenterology;  Laterality: N/A;   ERCP N/A 05/18/2023   Procedure: ENDOSCOPIC RETROGRADE CHOLANGIOPANCREATOGRAPHY (ERCP);  Surgeon: Jeani Hawking, MD;  Location: [facility];  Service: Gastroenterology;  Laterality: N/A;   ESOPHAGOGASTRODUODENOSCOPY Left 04/05/2021   Procedure: ESOPHAGOGASTRODUODENOSCOPY (EGD);  Surgeon: Jeani Hawking, MD;  Location: [facility];  Service: Endoscopy;  Laterality: Left;   ESOPHAGOGASTRODUODENOSCOPY N/A 05/10/2023   Procedure: ESOPHAGOGASTRODUODENOSCOPY (EGD);  Surgeon: Jeani Hawking, MD;  Location: [facility];  Service: Gastroenterology;  Laterality: N/A;   EUS N/A 05/10/2023   Procedure: UPPER ENDOSCOPIC ULTRASOUND (EUS) LINEAR;  Surgeon: Jeani Hawking, MD;  [facility];  Service: Gastroenterology;  Laterality: N/A;   FINE NEEDLE ASPIRATION N/A 05/10/2023   Procedure: FINE NEEDLE ASPIRATION (FNA) LINEAR;  Surgeon: Jeani Hawking, MD;  [facility]: [facility];  Service: Gastroenterology;  Laterality: N/A;   POLYPECTOMY  05/24/2018   Procedure: POLYPECTOMY;  Surgeon: Jeani Hawking, MD;  Location: [facility];  Service: Endoscopy;;   PORTACATH PLACEMENT Left 08/24/2020   Procedure: INSERTION PORT-A-CATH;  Surgeon: Luretha Murphy, MD;  Location: [facility];  Service: General;  Laterality: Left;  75/rm1   SPHINCTEROTOMY  05/10/2023   Procedure: Dennison Mascot;  Surgeon: Jeani Hawking, MD;  Location: [facility];  Service: Gastroenterology;;   Dennison Mascot  05/18/2023   Procedure:  Dennison Mascot;  Surgeon: Jeani Hawking, MD;  Location: [facility];  Service: Gastroenterology;;   Francine Graven REMOVAL  05/18/2023   Procedure: STENT REMOVAL;  Surgeon: Jeani Hawking, MD;  [facility]: [facility];  Service: Gastroenterology;;   SUBMUCOSAL TATTOO INJECTION  07/27/2020   Procedure: SUBMUCOSAL TATTOO INJECTION;  Surgeon: Jeani Hawking,  MD;  Location: [facility];  Service: Endoscopy;;    Family Hx:  Family History  Problem Relation Age of Onset   Hypertension Father     Social History:  reports that he has never smoked. He has never used smokeless tobacco. He reports current alcohol use of about 1.0 standard drink of alcohol per week. He reports that he does not use drugs.  Allergies:  Allergies  Allergen Reactions   Bee Venom Anaphylaxis, Swelling and Other (See Comments)    Cold Sweats, also   Latex Rash    Medications: Prior to Admission medications   Medication Sig Start Date End Date Taking? Authorizing Provider  amLODipine (NORVASC) 5 MG tablet Take 1 tablet (5 mg total) by mouth daily. 08/04/20  Yes Azucena Fallen, MD  atorvastatin (LIPITOR) 10 MG tablet Take 10 mg by mouth at bedtime. 05/08/19  Yes [provider]  cadexomer iodine (IODOSORB) 0.9 % gel Apply 1 Application topically See admin instructions. Apply to the left foot every other day as a part of wound care   Yes [provider]  carvedilol (COREG) 12.5 MG tablet Take 12.5 mg by mouth 2 (two) times daily with a meal. 08/23/22  Yes [provider]  Continuous Glucose Sensor (DEXCOM G6 SENSOR) MISC Inject 1 Device into the skin See admin instructions. Place a new sensor into the skin every 10 days   Yes [provider]  docusate sodium (COLACE) 100 MG capsule Take 100 mg by mouth daily as needed for mild constipation.   Yes [provider]  LANTUS SOLOSTAR 100 UNIT/ML Solostar Pen Inject 10-15 Units into the skin See admin instructions. Inject 15 units into the  skin in the morning and 10 units at bedtime   Yes [provider]  pantoprazole (PROTONIX) 40 MG tablet Take 40 mg by mouth 2 (two) times daily. 01/12/21  Yes [provider]  sodium chloride irrigation 0.9 % irrigation Irrigate with 15 mLs as directed See admin instructions. IRRIGATE USING 15 ML'S ON AFFECTED AREA OF THE LEFT FOOT EVERY OTHER DAY- DRY SURROUNDING SKIN, THEN APPLY IODOSORB GEL   Yes [provider]  ACCU-CHEK GUIDE test strip USE AS INSTRUCTED TO CHECK BLOOD SUGAR 2 TIMES DAILY 10/18/20   [provider]  KLOR-CON M20 20 MEQ tablet TAKE 1 TABLET BY MOUTH EVERY DAY Patient not taking: Reported on 05/07/2023 04/25/21   Rana Snare, NP  ondansetron (ZOFRAN) 8 MG tablet Take 1 tablet (8 mg total) by mouth every 8 (eight) hours as needed for nausea or vomiting. Start 72 hours after IV chemotherapy treatment Patient not taking: Reported on 05/07/2023 09/18/20   Benjiman Core, MD  prochlorperazine (COMPAZINE) 10 MG tablet Take 1 tablet (10 mg total) by mouth every 6 (six) hours as needed for nausea or vomiting. Patient not taking: Reported on 05/07/2023 10/11/20   Ladene Artist, MD    I have reviewed the patient's current medications.  Labs:  Results for orders placed or performed during the hospital encounter of 05/07/23 (from the past 48 hours)  Hemoglobin and hematocrit, blood     Status: Abnormal   Collection Time: 05/18/23  5:32 PM  Result Value Ref Range   Hemoglobin 7.5 (L) 13.0 - 17.0 g/dL   HCT 40.9 (L) 81.1 - 91.4 %    Comment: Performed at [facility], [address], [place], [place] [address]  Glucose, capillary     Status: Abnormal   Collection Time: 05/18/23  9:48 PM  Result Value Ref Range   Glucose-Capillary 148 (H) 70 - 99 mg/dL    Comment: Glucose reference range applies only to samples taken after fasting for at least 8 hours.  CBC with Differential/Platelet     Status: Abnormal   Collection Time:  05/19/23  3:40 AM  Result Value Ref Range   WBC 7.2 4.0 - 10.5 K/uL   RBC 2.54 (L) 4.22 - 5.81 MIL/uL   Hemoglobin 7.8 (L) 13.0 - 17.0 g/dL   HCT 16.1 (L) 09.6 - 04.5 %   MCV 96.1 80.0 - 100.0 fL   MCH 30.7 26.0 - 34.0 pg   MCHC 32.0 30.0 - 36.0 g/dL   RDW 40.9 (H) 81.1 - 91.4 %   Platelets 176 150 - 400 K/uL   nRBC 0.0 0.0 - 0.2 %   Neutrophils Relative % 71 %   Neutro Abs 5.2 1.7 - 7.7 K/uL   Lymphocytes Relative 12 %   Lymphs Abs 0.8 0.7 - 4.0 K/uL   Monocytes Relative 11 %   Monocytes Absolute 0.8 0.1 - 1.0 K/uL   Eosinophils Relative 4 %   Eosinophils Absolute 0.3 0.0 - 0.5 K/uL   Basophils Relative 1 %   Basophils Absolute 0.1 0.0 - 0.1 K/uL   Immature Granulocytes 1 %   Abs Immature Granulocytes 0.07 0.00 - 0.07 K/uL    Comment: Performed at [facility], [address] [place], [place] [address]  Comprehensive metabolic panel     Status: Abnormal   Collection Time: 05/19/23  3:40 AM  Result Value Ref Range   Sodium 130 (L) 135 - 145 mmol/L   Potassium 4.5 3.5 - 5.1 mmol/L   Chloride 109 98 - 111 mmol/L   CO2 13 (L) 22 - 32 mmol/L   Glucose, Bld 170 (H) 70 - 99 mg/dL    Comment: Glucose reference range applies only to samples taken after fasting for at least 8 hours.   BUN 56 (H) 6 - 20 mg/dL   Creatinine, Ser 6.21 (H) 0.61 - 1.24 mg/dL   Calcium 8.0 (L) 8.9 - 10.3 mg/dL   Total Protein 5.7 (L) 6.5 - 8.1 g/dL   Albumin 2.0 (L) 3.5 - 5.0 g/dL   AST 308 (H) 15 - 41 U/L   ALT 150 (H) 0 - 44 U/L   Alkaline Phosphatase 1,195 (H) 38 - 126 U/L   Total Bilirubin 13.7 (H) <1.2 mg/dL   GFR, Estimated 12 (L) >60 mL/min    Comment: (NOTE) Calculated using the CKD-EPI Creatinine Equation (2021)    Anion gap 8 5 - 15    Comment: Performed at [facility], [address], [place], [place] [address]  Magnesium     Status: None   Collection Time: 05/19/23  3:40 AM  Result Value Ref Range   Magnesium 1.9 1.7 - 2.4 mg/dL    Comment:  Performed at [facility], [address], [place], [place] [address]  Glucose, capillary     Status: Abnormal   Collection Time: 05/19/23  7:24 AM  Result Value Ref Range   Glucose-Capillary 165 (H) 70 - 99 mg/dL    Comment: Glucose reference range applies only to samples taken after fasting for at least 8 hours.  Glucose, capillary     Status: Abnormal   Collection Time: 05/19/23 11:24 AM  Result Value Ref Range   Glucose-Capillary 154 (H) 70 - 99 mg/dL    Comment: Glucose reference  range applies only to samples taken after fasting for at least 8 hours.  Glucose, capillary     Status: None   Collection Time: 05/19/23  4:25 PM  Result Value Ref Range   Glucose-Capillary 85 70 - 99 mg/dL    Comment: Glucose reference range applies only to samples taken after fasting for at least 8 hours.  Glucose, capillary     Status: Abnormal   Collection Time: 05/19/23  9:05 PM  Result Value Ref Range   Glucose-Capillary 124 (H) 70 - 99 mg/dL    Comment: Glucose reference range applies only to samples taken after fasting for at least 8 hours.  CBC with Differential/Platelet     Status: Abnormal   Collection Time: 05/20/23  3:07 AM  Result Value Ref Range   WBC 7.1 4.0 - 10.5 K/uL   RBC 2.42 (L) 4.22 - 5.81 MIL/uL   Hemoglobin 7.3 (L) 13.0 - 17.0 g/dL   HCT 16.1 (L) 09.6 - 04.5 %   MCV 98.3 80.0 - 100.0 fL   MCH 30.2 26.0 - 34.0 pg   MCHC 30.7 30.0 - 36.0 g/dL   RDW 40.9 (H) 81.1 - 91.4 %   Platelets 171 150 - 400 K/uL   nRBC 0.0 0.0 - 0.2 %   Neutrophils Relative % 67 %   Neutro Abs 4.8 1.7 - 7.7 K/uL   Lymphocytes Relative 16 %   Lymphs Abs 1.1 0.7 - 4.0 K/uL   Monocytes Relative 10 %   Monocytes Absolute 0.7 0.1 - 1.0 K/uL   Eosinophils Relative 5 %   Eosinophils Absolute 0.3 0.0 - 0.5 K/uL   Basophils Relative 1 %   Basophils Absolute 0.1 0.0 - 0.1 K/uL   Immature Granulocytes 1 %   Abs Immature Granulocytes 0.06 0.00 - 0.07 K/uL   Burr Cells PRESENT    Target  Cells PRESENT     Comment: Performed at [facility], [address] [place], [place] [address]  Magnesium     Status: None   Collection Time: 05/20/23  3:07 AM  Result Value Ref Range   Magnesium 1.9 1.7 - 2.4 mg/dL    Comment: Performed at [facility], [address], [place], [place] [address]  Comprehensive metabolic panel     Status: Abnormal   Collection Time: 05/20/23  3:07 AM  Result Value Ref Range   Sodium 134 (L) 135 - 145 mmol/L   Potassium 4.7 3.5 - 5.1 mmol/L   Chloride 112 (H) 98 - 111 mmol/L   CO2 12 (L) 22 - 32 mmol/L   Glucose, Bld 74 70 - 99 mg/dL    Comment: Glucose reference range applies only to samples taken after fasting for at least 8 hours.   BUN 59 (H) 6 - 20 mg/dL   Creatinine, Ser 8.65 (H) 0.61 - 1.24 mg/dL   Calcium 8.1 (L) 8.9 - 10.3 mg/dL   Total Protein 5.5 (L) 6.5 - 8.1 g/dL   Albumin 2.0 (L) 3.5 - 5.0 g/dL   AST 57 (H) 15 - 41 U/L   ALT 110 (H) 0 - 44 U/L   Alkaline Phosphatase 965 (H) 38 - 126 U/L   Total Bilirubin 6.9 (H) <1.2 mg/dL    Comment: DELTA CHECK NOTED   GFR, Estimated 10 (L) >60 mL/min    Comment: (NOTE) Calculated using the CKD-EPI Creatinine Equation (2021)    Anion gap 10 5 - 15    Comment: Performed at [facility], [address], [place], [place] [address]  Glucose, capillary     Status: Abnormal   Collection Time: 05/20/23  7:35 AM  Result Value Ref Range   Glucose-Capillary 56 (L) 70 - 99 mg/dL    Comment: Glucose reference range applies only to samples taken after fasting for at least 8 hours.  Glucose, capillary     Status: Abnormal   Collection Time: 05/20/23  8:01 AM  Result Value Ref Range   Glucose-Capillary 51 (L) 70 - 99 mg/dL    Comment: Glucose reference range applies only to samples taken after fasting for at least 8 hours.  Glucose, capillary     Status: Abnormal   Collection Time: 05/20/23  8:19 AM  Result Value Ref Range   Glucose-Capillary 62  (L) 70 - 99 mg/dL    Comment: Glucose reference range applies only to samples taken after fasting for at least 8 hours.  Glucose, capillary     Status: None   Collection Time: 05/20/23  8:44 AM  Result Value Ref Range   Glucose-Capillary 97 70 - 99 mg/dL    Comment: Glucose reference range applies only to samples taken after fasting for at least 8 hours.  Glucose, capillary     Status: Abnormal   Collection Time: 05/20/23 11:52 AM  Result Value Ref Range   Glucose-Capillary 170 (H) 70 - 99 mg/dL    Comment: Glucose reference range applies only to samples taken after fasting for at least 8 hours.  Glucose, capillary     Status: Abnormal   Collection Time: 05/20/23  4:34 PM  Result Value Ref Range   Glucose-Capillary 111 (H) 70 - 99 mg/dL    Comment: Glucose reference range applies only to samples taken after fasting for at least 8 hours.     ROS:  A comprehensive review of systems was negative.  Physical Exam: Vitals:   05/20/23 0516 05/20/23 1527  BP: (!) 143/78 (!) 156/82  Pulse: 64 67  Resp: 18 18  Temp: 97.9 F (36.6 C) 98.1 F (36.7 C)  SpO2: 100% 100%     General: Very well-appearing black male in no acute distress HEENT: Pupils are equal round reactive to light, extraocular motions are intact, mucous membranes are moist  Neck: No JVD Heart: The rate and rhythm Lungs: Clear bilaterally Abdomen: Stented, nontender Extremities: No edema Skin: Warm and dry Neuro: Alert and nonfocal  Assessment/Plan: 57 year old black male with colon cancer now. 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  During hospital management of this has developed acute on chronic renal failure 1.Renal-has had progressive CKD since 2019.  Etiology is not completely clear.  He reports he is never seen a kidney specialist.  Recently, baseline creatinine around 3.  He now suffered acute on chronic renal failure in the setting of a malignant common bile duct stricture  with hyperbilirubinemia, as well as contrasted studies.  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  I am going to recheck urinalysis and quantify proteinuria.  Imaging done during this hospitalization does not indicate renal obstruction.  Fortunately, there are no indications for dialysis at this time but GFR is quite poor at 10.  Right now, the goal would be to get kidney function stable enough so that this could be managed as an outpatient.  I am hoping with now bilirubin decreasing kidney function improving will be close to follow 2. Hypertension/volume  -seems euvolemic.  Blood pressure is fine.  On Norvasc, carvedilol.  No recommendations in this regard 3.  Metastatic colon cancer-not sure what plan is from here 5.  Biliary obstruction-seems to be managed at this time status post stenting-GI has signed off 6. Anemia  -secondary to hospitalization and probably CKD.  Will check iron stores but would not be a candidate for ESA given active malignancy   Cecille Aver 05/20/2023, 5:04 PM

## 2023-05-21 DIAGNOSIS — R7989 Other specified abnormal findings of blood chemistry: Secondary | ICD-10-CM | POA: Diagnosis not present

## 2023-05-21 DIAGNOSIS — C801 Malignant (primary) neoplasm, unspecified: Secondary | ICD-10-CM | POA: Diagnosis not present

## 2023-05-21 DIAGNOSIS — K831 Obstruction of bile duct: Secondary | ICD-10-CM | POA: Diagnosis not present

## 2023-05-21 LAB — COMPREHENSIVE METABOLIC PANEL
ALT: 96 U/L — ABNORMAL HIGH (ref 0–44)
AST: 42 U/L — ABNORMAL HIGH (ref 15–41)
Albumin: 1.9 g/dL — ABNORMAL LOW (ref 3.5–5.0)
Alkaline Phosphatase: 994 U/L — ABNORMAL HIGH (ref 38–126)
Anion gap: 7 (ref 5–15)
BUN: 58 mg/dL — ABNORMAL HIGH (ref 6–20)
CO2: 14 mmol/L — ABNORMAL LOW (ref 22–32)
Calcium: 7.7 mg/dL — ABNORMAL LOW (ref 8.9–10.3)
Chloride: 110 mmol/L (ref 98–111)
Creatinine, Ser: 5.98 mg/dL — ABNORMAL HIGH (ref 0.61–1.24)
GFR, Estimated: 10 mL/min — ABNORMAL LOW (ref >60)
Glucose, Bld: 121 mg/dL — ABNORMAL HIGH (ref 70–99)
Potassium: 4.8 mmol/L (ref 3.5–5.1)
Sodium: 131 mmol/L — ABNORMAL LOW (ref 135–145)
Total Bilirubin: 5.6 mg/dL — ABNORMAL HIGH (ref <1.2)
Total Protein: 5.9 g/dL — ABNORMAL LOW (ref 6.5–8.1)

## 2023-05-21 LAB — GLUCOSE, CAPILLARY
Glucose-Capillary: 104 mg/dL — ABNORMAL HIGH (ref 70–99)
Glucose-Capillary: 82 mg/dL (ref 70–99)
Glucose-Capillary: 91 mg/dL (ref 70–99)
Glucose-Capillary: 132 mg/dL — ABNORMAL HIGH (ref 70–99)
Glucose-Capillary: 141 mg/dL — ABNORMAL HIGH (ref 70–99)

## 2023-05-21 LAB — TYPE AND SCREEN
ABO/RH(D): A POS
Antibody Screen: NEGATIVE
Unit division: 0

## 2023-05-21 LAB — BPAM RBC
Blood Product Expiration Date: 202501262359
ISSUE DATE / TIME: 202412270950
Unit Type and Rh: 6200

## 2023-05-21 LAB — IRON AND TIBC
Iron: 81 ug/dL (ref 45–182)
Saturation Ratios: 32 % (ref 17.9–39.5)
TIBC: 251 ug/dL (ref 250–450)
UIBC: 170 ug/dL

## 2023-05-21 LAB — FERRITIN: Ferritin: 321 ng/mL (ref 24–336)

## 2023-05-21 MED ORDER — SODIUM BICARBONATE 650 MG PO TABS
650.0000 mg | ORAL_TABLET | Freq: Three times a day (TID) | ORAL | Status: DC
  Administered 2023-05-21 – 2023-05-22 (×4): 650 mg via ORAL
  Filled 2023-05-21 (×4): qty 1

## 2023-05-21 NOTE — Progress Notes (Addendum)
Jeffrey Young   DOB:Aug 24, 1965   GN#:[id number]      ASSESSMENT & PLAN:  1.  Descending colon cancer, stage IIIc with liver mets - S/p left partial colectomy and FOLFOX chemotherapy in 2022  - Progression of disease October 2024. - Has not been on active cancer treatment has been on close observation. -Patient is being followed closely by medical oncology/Dr. Truett Perna.   -Plan is for systemic therapy when hyperbilirubinemia and renal function stabilizes.  There is no urgency to initiate systemic therapy at this time since he does not have significant for visceral tumor involvement. - Patient will be scheduled for oncology out patient follow-up appointment within the next 2 weeks.     2.  Hematuria -Hematuria likely secondary to elevated bilirubin.  -Monitor CBC closely  3.  Anemia - Anemia likely secondary to renal failure plus malignancy, and frequent blood draws - Transfuse PRBC for Hgb less than 7.0.   - Last Hgb 7.3 on 05/20/23.  Repeat CBC with differential today.   - Holding on erythropoietin due to the metastatic colon cancer diagnosis   4.  Renal insufficiency - Baseline usually in the 3 range - Creatinine elevated in the 5 range  -Avoid nephrotoxic medications - Monitor CMP closely - Appreciate nephrology consult, no indication for hemodialysis at this time.   5.  Obstructive jaundice, ongoing -Likely secondary to lymphadenopathy  - MRI/MRCP this admission showed 2 liver masses concerning for mets. ERCP and EUS done on 05/10/2023. - Repeat ERCP with 2 stents placed on 05/18/2023.  Bilirubin improving significantly to 5.6. -LFTs continue to be high although with slight improvement - Continue to monitor   6.  Diabetes -Continue to monitor blood sugar levels -Administer insulin as needed -Follow-up with PCP/endocrine upon discharge  Code Status Full   Subjective:  Patient seen awake and alert laying supine in bed.  Reports he is feeling a little better and that he  has noticed his urine has lightened up.  Denies new acute complaints.  [name] at bedside.  Objective:  Vitals:   05/20/23 1944 05/21/23 0613  BP: (!) 151/79 (!) 151/72  Pulse: 65 66  Resp: 20 20  Temp: (!) 97.4 F (36.3 C) 98.2 F (36.8 C)  SpO2: 99% 97%     Intake/Output Summary (Last 24 hours) at 05/21/2023 1017 Last data filed at 05/21/2023 0840 Gross per 24 hour  Intake 440 ml  Output 1250 ml  Net -810 ml     REVIEW OF SYSTEMS:   Constitutional: Denies fevers, chills or abnormal night sweats Eyes: + Yellow eyes, denies blurriness of vision, double vision or watery eyes Ears, nose, mouth, throat, and face: Denies mucositis or sore throat Respiratory: Denies cough, dyspnea or wheezes Cardiovascular: Denies palpitation, chest discomfort or lower extremity swelling Gastrointestinal:  Denies nausea, heartburn or change in bowel habits Skin: Denies abnormal skin rashes Lymphatics: Denies new lymphadenopathy or easy bruising Neurological: Denies numbness, tingling or new weaknesses Behavioral/Psych: Mood is stable, no new changes  All other systems were reviewed with the patient and are negative.  PHYSICAL EXAMINATION: ECOG PERFORMANCE STATUS: 2 - Symptomatic, <50% confined to bed  Vitals:   05/20/23 1944 05/21/23 0613  BP: (!) 151/79 (!) 151/72  Pulse: 65 66  Resp: 20 20  Temp: (!) 97.4 F (36.3 C) 98.2 F (36.8 C)  SpO2: 99% 97%   Filed Weights   05/07/23 0933 05/10/23 1115  Weight: 191 lb 12.8 oz (87 kg) 191 lb 12.8 oz (87 kg)  GENERAL: alert, no distress and comfortable SKIN: + Pale skin color, texture, turgor are normal, no rashes or significant lesions EYES: normal, conjunctiva are pink and non-injected, + scleral icterus OROPHARYNX: no exudate, no erythema and lips, buccal mucosa, and tongue normal  NECK: supple, thyroid normal size, non-tender, without nodularity LYMPH: no palpable lymphadenopathy in the cervical, axillary or inguinal LUNGS:  clear to auscultation and percussion with normal breathing effort HEART: regular rate & rhythm and no murmurs and no lower extremity edema ABDOMEN: abdomen soft, non-tender and normal bowel sounds MUSCULOSKELETAL: no cyanosis of digits and no clubbing + left metatarsal amputee PSYCH: alert & oriented x 3 with fluent speech NEURO: no focal motor/sensory deficits   All questions were answered. The patient knows to call the clinic with any problems, questions or concerns.   The total time spent in the appointment was 40 minutes encounter with patient including review of chart and various tests results, discussions about plan of care and coordination of care plan  Dawson Bills, NP 05/21/2023 10:17 AM    Labs Reviewed:  Lab Results  Component Value Date   WBC 7.1 05/20/2023   HGB 7.3 (L) 05/20/2023   HCT 23.8 (L) 05/20/2023   MCV 98.3 05/20/2023   PLT 171 05/20/2023   Recent Labs    05/09/23 0319 05/10/23 0406 05/11/23 0318 05/11/23 1707 05/19/23 0340 05/20/23 0307 05/21/23 0256  NA 130* 130* 127*   < > 130* 134* 131*  K 3.8 3.5 4.1   < > 4.5 4.7 4.8  CL 104 102 102   < > 109 112* 110  CO2 19* 18* 16*   < > 13* 12* 14*  GLUCOSE 144* 100* 224*   < > 170* 74 121*  BUN 40* 40* 42*   < > 56* 59* 58*  CREATININE 4.18* 4.14* 3.37*   < > 5.36* 5.89* 5.98*  CALCIUM 7.5* 8.0* 7.9*   < > 8.0* 8.1* 7.7*  GFRNONAA 16* 16* 20*   < > 12* 10* 10*  PROT 5.5* 5.9* 6.1*   < > 5.7* 5.5* 5.9*  ALBUMIN 1.8* 1.9* 2.0*   < > 2.0* 2.0* 1.9*  AST 89* 97* 102*   < > 117* 57* 42*  ALT 80* 84* 93*   < > 150* 110* 96*  ALKPHOS 865* 966* 1,013*   < > 1,195* 965* 994*  BILITOT 11.8* 13.6* 13.7*   < > 13.7* 6.9* 5.6*  BILIDIR 7.7* 8.9* 8.7*  --   --   --   --   IBILI 4.1* 4.7* 5.0*  --   --   --   --    < > = values in this interval not displayed.    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 EXAM: ERCP TECHNIQUE: Multiple spot images obtained with the fluoroscopic device and submitted for interpretation post-procedure. COMPARISON:  Prior ERCP on 05/10/2023 FINDINGS: Imaging with a C-arm demonstrates cannulation of the common bile duct, are retrieval of a pre-existing endoscopic biliary stent and placement new biliary stents in the common bile duct. IMPRESSION: Imaging during endoscopic common bile duct stent retrieval and replacement. These images were submitted for radiologic interpretation only. Please see the procedural report for the amount of contrast and the fluoroscopy time utilized. Electronically Signed   By: Irish Lack M.D.   On: 05/18/2023 17:17   MR ABDOMEN MRCP W WO CONTAST  Result Date: 05/15/2023 CLINICAL DATA:  Obstructive jaundice due to malignant neoplasm. Recent ERCP with biliary stent placement. EXAM: MRI ABDOMEN WITHOUT AND WITH CONTRAST (INCLUDING MRCP) TECHNIQUE: Multiplanar multisequence MR imaging of the abdomen was performed both before and after the administration of intravenous contrast. Heavily T2-weighted images of the biliary and pancreatic ducts were obtained, and three-dimensional MRCP images were rendered by post processing. CONTRAST:  8mL GADAVIST GADOBUTROL 1 MMOL/ML IV SOLN COMPARISON:  MRCP 05/08/2023, abdominopelvic CT 05/07/2023, ultrasound 05/14/2023 and images from ERCP 05/10/2023. FINDINGS: Technical note: Despite efforts by the technologist and patient, moderate motion artifact is present on today's exam and could not be eliminated. This reduces exam sensitivity and specificity. Lower chest: New small bilateral pleural effusions with associated dependent atelectasis at both lung bases. Hepatobiliary: The liver as a non cirrhotic morphology, without significant steatosis. Although assessment is mildly limited by the motion artifact, no focal liver lesions are identified on today's study to suggest metastatic disease. There is persistent at  least moderate intra and extrahepatic biliary dilatation, similar to the previous MRI. The common bile duct measures up to 1.5 cm in diameter. There is an abrupt transition in duct caliber with shelving above the pancreatic head, consistent with known malignant biliary stricture. The biliary stent is not well visualized (questionably seen on image 20 of series 11). Cannot exclude stent migration given the persistent biliary dilatation. No well-defined mass is identified, although there is an approximately 1.6 cm area of restricted diffusion and decreased enhancement at the level of the biliary obstruction (image 47/15). Mild gallbladder distension and wall thickening, slightly improved from previous MRI. Pancreas: No evidence of pancreatic mass, pancreatic ductal dilatation or surrounding inflammation. Spleen: Normal in size without focal abnormality. Adrenals/Urinary Tract: Both adrenal glands appear normal. No evidence of renal mass or hydronephrosis. Unchanged renal cysts bilaterally for which no specific follow-up imaging is recommended. Stomach/Bowel: The stomach appears unremarkable for its degree of distension. No evidence of bowel wall thickening, distention or surrounding inflammatory change. Vascular/Lymphatic: Again demonstrated are multiple enlarged peripancreatic and porta hepatis lymph nodes, measuring up to 1.8 cm short axis on image 18/6. These are most obvious on the diffusion-weighted images. The portal, superior mesenteric and splenic veins are patent without evidence of tumor encasement. No significant arterial abnormalities are seen. Other: Mild body wall edema without ascites or focal extraluminal fluid collection. Musculoskeletal: No acute or significant osseous findings. IMPRESSION: 1. Persistent at least moderate intra and extrahepatic biliary dilatation with abrupt transition in duct caliber above the pancreatic head, consistent with known malignant biliary stricture. The biliary stent is  not well visualized. Cannot exclude stent migration given the persistent biliary dilatation. Suggest radiographic follow-up. 2. No well-defined mass is identified, although there is an approximately 1.6 cm area of restricted diffusion and decreased enhancement at the level of the biliary obstruction. 3. Persistent peripancreatic and porta hepatis adenopathy, consistent with metastatic disease. 4. No definite evidence of hepatic metastatic disease on today's motion limited examination. 5. New small bilateral pleural effusions with associated dependent atelectasis at both lung bases. Electronically Signed   By: Carey Bullocks M.D.   On: 05/15/2023 14:32   MR 3D Recon At Scanner Result Date: 05/15/2023 CLINICAL DATA:  Obstructive jaundice due to malignant neoplasm. Recent ERCP with biliary stent placement. EXAM: MRI ABDOMEN WITHOUT AND WITH CONTRAST (INCLUDING MRCP) TECHNIQUE: Multiplanar multisequence MR imaging of the abdomen was performed both before and after the administration of intravenous contrast. Heavily T2-weighted images of the biliary and pancreatic ducts  were obtained, and three-dimensional MRCP images were rendered by post processing. CONTRAST:  8mL GADAVIST GADOBUTROL 1 MMOL/ML IV SOLN COMPARISON:  MRCP 05/08/2023, abdominopelvic CT 05/07/2023, ultrasound 05/14/2023 and images from ERCP 05/10/2023. FINDINGS: Technical note: Despite efforts by the technologist and patient, moderate motion artifact is present on today's exam and could not be eliminated. This reduces exam sensitivity and specificity. Lower chest: New small bilateral pleural effusions with associated dependent atelectasis at both lung bases. Hepatobiliary: The liver as a non cirrhotic morphology, without significant steatosis. Although assessment is mildly limited by the motion artifact, no focal liver lesions are identified on today's study to suggest metastatic disease. There is persistent at least moderate intra and extrahepatic  biliary dilatation, similar to the previous MRI. The common bile duct measures up to 1.5 cm in diameter. There is an abrupt transition in duct caliber with shelving above the pancreatic head, consistent with known malignant biliary stricture. The biliary stent is not well visualized (questionably seen on image 20 of series 11). Cannot exclude stent migration given the persistent biliary dilatation. No well-defined mass is identified, although there is an approximately 1.6 cm area of restricted diffusion and decreased enhancement at the level of the biliary obstruction (image 47/15). Mild gallbladder distension and wall thickening, slightly improved from previous MRI. Pancreas: No evidence of pancreatic mass, pancreatic ductal dilatation or surrounding inflammation. Spleen: Normal in size without focal abnormality. Adrenals/Urinary Tract: Both adrenal glands appear normal. No evidence of renal mass or hydronephrosis. Unchanged renal cysts bilaterally for which no specific follow-up imaging is recommended. Stomach/Bowel: The stomach appears unremarkable for its degree of distension. No evidence of bowel wall thickening, distention or surrounding inflammatory change. Vascular/Lymphatic: Again demonstrated are multiple enlarged peripancreatic and porta hepatis lymph nodes, measuring up to 1.8 cm short axis on image 18/6. These are most obvious on the diffusion-weighted images. The portal, superior mesenteric and splenic veins are patent without evidence of tumor encasement. No significant arterial abnormalities are seen. Other: Mild body wall edema without ascites or focal extraluminal fluid collection. Musculoskeletal: No acute or significant osseous findings. IMPRESSION: 1. Persistent at least moderate intra and extrahepatic biliary dilatation with abrupt transition in duct caliber above the pancreatic head, consistent with known malignant biliary stricture. The biliary stent is not well visualized. Cannot exclude  stent migration given the persistent biliary dilatation. Suggest radiographic follow-up. 2. No well-defined mass is identified, although there is an approximately 1.6 cm area of restricted diffusion and decreased enhancement at the level of the biliary obstruction. 3. Persistent peripancreatic and porta hepatis adenopathy, consistent with metastatic disease. 4. No definite evidence of hepatic metastatic disease on today's motion limited examination. 5. New small bilateral pleural effusions with associated dependent atelectasis at both lung bases. Electronically Signed   By: Carey Bullocks M.D.   On: 05/15/2023 14:32   US Abdomen Limited RUQ (LIVER/GB) Result Date: 05/14/2023 CLINICAL DATA:  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  CT 05/07/2023.  MRI 05/08/2023 FINDINGS: Gallbladder: Gallbladder is somewhat contracted and filled with heterogeneous material. This is likely combination of stones and sludge. There is progression since the previous study. No gallbladder wall thickening or pericholecystic edema. Murphy's sign is negative. Common bile duct: Diameter: 14 mm, dilated. Liver: Intrahepatic bile duct dilatation is demonstrated. Focal liver lesion seen at previous imaging studies are not demonstrated on the current examination. Portal vein is patent on color Doppler imaging with normal direction of blood flow towards  the liver. Other: None. IMPRESSION: 1. Somewhat contracted gallbladder filled with heterogeneous material likely representing stones and sludge. This is progressed since the prior study. 2. Intra and extrahepatic bile duct dilatation consistent with changes due to known biliary malignancy. Electronically Signed   By: Burman Nieves M.D.   On: 05/14/2023 23:10   DG ERCP Result Date: 05/10/2023 CLINICAL DATA:  ERCP EXAM: ERCP TECHNIQUE: Multiple spot images obtained with the fluoroscopic device  and submitted for interpretation post-procedure. FLUOROSCOPY TIME: FLUOROSCOPY TIME 1 minute, 20 seconds (30.8 mGy) COMPARISON:  MRCP-05/08/2023 FINDINGS: 8 spot fluoroscopic images of the right upper abdominal quadrant during ERCP are provided for review. Initial image demonstrates an ERCP probe overlying the right upper abdominal quadrant. Subsequent images demonstrate selective cannulation and opacification of the central aspect of the common hepatic duct with opacification of the intrahepatic biliary tree which appears dilated. Completion images demonstrate placement of a plastic stent overlying the expected location of the CBD. IMPRESSION: ERCP with biliary stent placement as above. These images were submitted for radiologic interpretation only. Please see the procedural report for the amount of contrast and the fluoroscopy time utilized. Electronically Signed   By: Simonne Come M.D.   On: 05/10/2023 15:08   US Abdomen Limited RUQ (LIVER/GB) Result Date: 05/08/2023 CLINICAL DATA:  Painless jaundice Cholecystitis EXAM: ULTRASOUND ABDOMEN LIMITED RIGHT UPPER QUADRANT COMPARISON:  MRI abdomen nodule 06/07/2022 FINDINGS: Gallbladder: Gallstones: Present Sludge: Present Gallbladder Wall: Within normal limits Pericholecystic fluid: Minimal Sonographic Murphy's Sign: Negative per technologist Common bile duct: Diameter: 13 mm Liver: Parenchymal echogenicity: Within normal limits Contours: Normal Lesions: None Portal vein: Patent.  Hepatopetal flow Other: None. IMPRESSION: 1. Cholelithiasis without definitive evidence of acute cholecystitis. If there is continued suspicion for cholecystitis, further evaluation with HIDA scan would be beneficial. 2. Dilated common bile duct again seen which remains suspicious for stricture/mass. Electronically Signed   By: Acquanetta Belling M.D.   On: 05/08/2023 10:14   MR ABDOMEN MRCP WO CONTRAST Result Date: 05/08/2023 CLINICAL DATA:  Inpatient. Jaundice. Chronic kidney disease.  Metastatic colon cancer. EXAM: MRI ABDOMEN WITHOUT CONTRAST  (INCLUDING MRCP) TECHNIQUE: Multiplanar multisequence MR imaging of the abdomen was performed. Heavily T2-weighted images of the biliary and pancreatic ducts were obtained, and three-dimensional MRCP images were rendered by post processing. COMPARISON:  05/07/2023 CT abdomen/pelvis. FINDINGS: Lower chest: Trace posterior right pleural effusion. Hepatobiliary: Normal liver size and configuration. No hepatic steatosis. There is a solitary 1.1 x 0.9 cm anterior segment 2 left liver mass (series 7/image 10 and best seen on diffusion sequence series 5/image 10), worrisome for liver metastasis. No additional liver lesions. Mild gallbladder distention. Moderate diffuse gallbladder wall thickening. Minimal pericholecystic fluid and fat stranding. Small amount of layering sludge in the gallbladder. No gallstones. Moderate diffuse intrahepatic biliary ductal dilatation. Dilated proximal common bile duct with diameter 13 mm. Abrupt malignant-appearing biliary stricture at the level of the middle third of the common bile duct with diminutive caliber of the lower third of the common bile duct (1 mm diameter). No choledocholithiasis. There is a mildly enlarged peripancreatic/porta hepatis 1.1 cm pathologic lymph node abutting the site of the common bile duct stricture (series 3/image 16). Pancreas: No pancreatic mass or duct dilation. No peripancreatic fluid collections. Unable to confidently assess for pancreas divisum given motion degradation on the MRCP sequences. Spleen: Normal size. No mass. Adrenals/Urinary Tract: Normal adrenals. No hydronephrosis. Small simple bilateral renal cysts, largest 3.5 cm in the interpolar right kidney, for which no follow-up imaging is  recommended. Stomach/Bowel: Normal non-distended stomach. Visualized small and large bowel is normal caliber, with no bowel wall thickening. Partially visualized postsurgical changes from partial left  colectomy. Vascular/Lymphatic: Normal caliber abdominal aorta. Multiple mildly enlarged porta hepatis nodes up to 1.9 cm (series 5/image 16). Mildly enlarged 1.0 cm portacaval node (series 5/image 22). Other: No abdominal ascites or focal fluid collection. Musculoskeletal: No aggressive appearing focal osseous lesions. IMPRESSION: 1. Abrupt malignant-appearing biliary stricture at the level of the middle third of the common bile duct, with adjacent porta hepatis adenopathy. Moderate diffuse intrahepatic and proximal extrahepatic biliary ductal dilatation. No choledocholithiasis. 2. Mild gallbladder distention with moderate diffuse gallbladder wall thickening. Minimal pericholecystic fluid and fat stranding. Small amount of layering sludge in the gallbladder. No gallstones. Findings are nonspecific, with differential including acute cholecystitis versus reactive changes to biliary obstruction. 3. Solitary 1.1 cm anterior segment 2 left liver mass, worrisome for liver metastasis. 4. Multiple mildly enlarged porta hepatis and portacaval nodes, suspicious for metastatic adenopathy. 5. Trace posterior right pleural effusion. Electronically Signed   By: Delbert Phenix M.D.   On: 05/08/2023 09:03   MR 3D Recon At Scanner Result Date: 05/08/2023 CLINICAL DATA:  Inpatient. Jaundice. Chronic kidney disease. Metastatic colon cancer. EXAM: MRI ABDOMEN WITHOUT CONTRAST  (INCLUDING MRCP) TECHNIQUE: Multiplanar multisequence MR imaging of the abdomen was performed. Heavily T2-weighted images of the biliary and pancreatic ducts were obtained, and three-dimensional MRCP images were rendered by post processing. COMPARISON:  05/07/2023 CT abdomen/pelvis. FINDINGS: Lower chest: Trace posterior right pleural effusion. Hepatobiliary: Normal liver size and configuration. No hepatic steatosis. There is a solitary 1.1 x 0.9 cm anterior segment 2 left liver mass (series 7/image 10 and best seen on diffusion sequence series 5/image 10),  worrisome for liver metastasis. No additional liver lesions. Mild gallbladder distention. Moderate diffuse gallbladder wall thickening. Minimal pericholecystic fluid and fat stranding. Small amount of layering sludge in the gallbladder. No gallstones. Moderate diffuse intrahepatic biliary ductal dilatation. Dilated proximal common bile duct with diameter 13 mm. Abrupt malignant-appearing biliary stricture at the level of the middle third of the common bile duct with diminutive caliber of the lower third of the common bile duct (1 mm diameter). No choledocholithiasis. There is a mildly enlarged peripancreatic/porta hepatis 1.1 cm pathologic lymph node abutting the site of the common bile duct stricture (series 3/image 16). Pancreas: No pancreatic mass or duct dilation. No peripancreatic fluid collections. Unable to confidently assess for pancreas divisum given motion degradation on the MRCP sequences. Spleen: Normal size. No mass. Adrenals/Urinary Tract: Normal adrenals. No hydronephrosis. Small simple bilateral renal cysts, largest 3.5 cm in the interpolar right kidney, for which no follow-up imaging is recommended. Stomach/Bowel: Normal non-distended stomach. Visualized small and large bowel is normal caliber, with no bowel wall thickening. Partially visualized postsurgical changes from partial left colectomy. Vascular/Lymphatic: Normal caliber abdominal aorta. Multiple mildly enlarged porta hepatis nodes up to 1.9 cm (series 5/image 16). Mildly enlarged 1.0 cm portacaval node (series 5/image 22). Other: No abdominal ascites or focal fluid collection. Musculoskeletal: No aggressive appearing focal osseous lesions. IMPRESSION: 1. Abrupt malignant-appearing biliary stricture at the level of the middle third of the common bile duct, with adjacent porta hepatis adenopathy. Moderate diffuse intrahepatic and proximal extrahepatic biliary ductal dilatation. No choledocholithiasis. 2. Mild gallbladder distention with  moderate diffuse gallbladder wall thickening. Minimal pericholecystic fluid and fat stranding. Small amount of layering sludge in the gallbladder. No gallstones. Findings are nonspecific, with differential including acute cholecystitis versus reactive changes to biliary obstruction.  3. Solitary 1.1 cm anterior segment 2 left liver mass, worrisome for liver metastasis. 4. Multiple mildly enlarged porta hepatis and portacaval nodes, suspicious for metastatic adenopathy. 5. Trace posterior right pleural effusion. Electronically Signed   By: Delbert Phenix M.D.   On: 05/08/2023 09:03   CT ABDOMEN PELVIS WO CONTRAST Result Date: 05/07/2023 CLINICAL DATA:  History of colon cancer, metastatic. No which on this and worsening renal failure. * Tracking Code: BO * EXAM: CT ABDOMEN AND PELVIS WITHOUT CONTRAST TECHNIQUE: Multidetector CT imaging of the abdomen and pelvis was performed following the standard protocol without IV contrast. RADIATION DOSE REDUCTION: This exam was performed according to the departmental dose-optimization program which includes automated exposure control, adjustment of the mA and/or kV according to patient size and/or use of iterative reconstruction technique. COMPARISON:  03/05/2023 FINDINGS: Lower chest: Dependent atelectasis in the lung bases. Hepatobiliary: Intrahepatic biliary duct dilatation is new in the interval. There may be some associated periportal edema. Portal venous anatomy is prominent and ill-defined. Density in the portal vein is similar to background blood pool density in the aorta. Common duct is dilated up to 15 mm diameter and appears to abruptly terminate prior to entering the head of the pancreas. Gallbladder is distended with ill definition of the gallbladder wall and potential pericholecystic edema/fluid. Pancreas: No focal mass lesion. No dilatation of the main duct. No intraparenchymal cyst. No peripancreatic edema. Spleen: No splenomegaly. No suspicious focal mass  lesion. Adrenals/Urinary Tract: No adrenal nodule or mass. Stable right renal cyst. No followup imaging is recommended. Left kidney unremarkable on noncontrast imaging. No hydroureteronephrosis. Bladder is decompressed. Stomach/Bowel: Mild circumferential wall thickening noted distal esophagus. Stomach is unremarkable. No gastric wall thickening. No evidence of outlet obstruction. Duodenum is normally positioned as is the ligament of Treitz. No small bowel wall thickening. No small bowel dilatation. The terminal ileum is normal. The appendix is normal. Status post left hemicolectomy with reanastomosis. Vascular/Lymphatic: No abdominal aortic aneurysm. No abdominal aortic atherosclerotic calcification. Small lymph nodes in the gastrohepatic ligament (image 21/2) are similar to prior. Small lymph nodes also noted hepatoduodenal ligament. Index 14 mm short axis hepatoduodenal ligament node measured previously is 16 mm on 27/2 today. Index left external iliac lymph node measured previously at 14 mm short axis is 14 mm again today on 76/2. Other small lymph nodes along the left pelvic sidewall again noted. Reproductive: The prostate gland and seminal vesicles are unremarkable. Other: No intraperitoneal free fluid. Musculoskeletal: Small left groin hernia contains only fat. No worrisome lytic or sclerotic osseous abnormality. IMPRESSION: 1. New intrahepatic and extrahepatic biliary duct dilatation with possible associated periportal edema. Common duct is dilated up to 15 mm diameter and appears to abruptly terminate prior to entering the head of the pancreas. Imaging features are compatible with biliary obstruction, potentially secondary to the hepatoduodenal ligament lymphadenopathy seen previously. Assessment is limited due to lack of intravenous contrast material. 2. Distended gallbladder with possible gallbladder wall thickening and pericholecystic edema. Right upper quadrant ultrasound recommended to evaluate for  acute cholecystitis. 3. No substantial change in lymphadenopathy of the gastrohepatic ligament, hepatoduodenal ligament, and left external iliac chain. 4. Tiny hypoattenuating foci seen previously in the dome of the left liver are not evident on the current exam. 5. Mild circumferential wall thickening distal esophagus. Esophagitis would be a consideration. 6. Small left groin hernia contains only fat. Electronically Signed   By: Kennith Center M.D.   On: 05/07/2023 14:04   Jeffrey Young was interviewed and examined.  He underwent placement of new biliary stents on 05/18/2023.  The bilirubin is lower.  He has developed progressive renal failure and is now followed by nephrology.  Jeffrey Young has metastatic colon cancer.  There is no urgency to begin treatment of the colon cancer.  He appears to have a minimal tumor burden and the struct of jaundice has been palliated with stent placement.  He has anemia secondary to chronic renal failure, chronic disease, and frequent blood draws.  I recommend transfusing packed red blood cells for symptomatic anemia or hemoglobin of less than 7.  We have held on erythropoietin therapy due to the colon cancer diagnosis.  Outpatient follow-up will be scheduled at the [facility].

## 2023-05-21 NOTE — Progress Notes (Signed)
PROGRESS NOTE    Jeffrey Young  UJW:[id number] DOB: [birth year] DOA: 05/07/2023 PCP: Tracey Harries, MD    Brief Narrative:  57 year old gentleman with previously treated stage III colon cancer, type 2 diabetes, left foot wound, CKD stage IV, hypertension presented with bloody urine however he was noted to have jaundiced with no blood in the urine.  Bilirubin was 8.  Mildly elevated creatinine from his baseline.  Imaging suggested possible cholecystitis. Underwent ERCP on 12/19, bilirubin is still rising and repeat ERCP on 12/27. LFTs improving, however has renal function tests not improving.  Subjective:  Patient seen and examined.  No more itching.  Colestyramine causing him diarrhea.  Will stop. Denies any nausea vomiting.  Urine output 600 mL noted last 24 hours. Patient is eager to go home however understands kidneys not improved yet. Case discussed with nephrology.  Assessment & Plan:   Obstructive jaundice due to malignancy: 12/19, status post ERCP that showed CBD obstruction due to mass, stenting done previous and FNA of the lymph nodes.  Pathology consistent with adenocarcinoma with colon cancer as primary.  LFTs continued to rise, MRCP showed possible biliary stent migration or occlusion.  Underwent repeat ERCP with stenting.  Bilirubin level is stable or downtrending now.   This is likely metastatic colon cancer, oncology is following.  AKI on CKD stage IV with metabolic acidosis: Recent known creatinine of 3.1-3.5.  Admitted with creatinine of 5.02.  Remains elevated and fluctuating.  Today 5.98. There is no evidence of hydronephrosis on the CT scan.  Continue to have elevated creatinine, nephrology were consulted.  No evidence of uremia.  Potassium is normal. Urine output is adequate.  This is likely multifactorial.  Probably plateauing.  Nephrology following.  Recommended to see downtrending before discharging home.  Anemia of chronic disease: Received total 2 units of  PRBC.  Hemoglobin 7.3.  Will continue to monitor.  Iron levels are adequate.  Hypertension: Blood pressure stable on carvedilol and amlodipine.  Type 2 diabetes with hypoglycemia.  Blood sugars better now on decreased dose of insulin.    Left foot wound, previous left TMA.  Present on admission: Dressing instructions attached.  DVT prophylaxis: SCDs Start: 05/08/23 0225   Code Status: Full code Family Communication: Wife at the bedside. Disposition Plan: Status is: Inpatient Remains inpatient appropriate because: IV fluids.  Monitoring of renal function test.     Consultants:  Gastroenterology Nephrology.  Procedures:  ERCP 12/19 ERCP 12/27  Antimicrobials:  None     Objective: Vitals:   05/20/23 1527 05/20/23 1944 05/21/23 0613 05/21/23 1144  BP: (!) 156/82 (!) 151/79 (!) 151/72 (!) 156/81  Pulse: 67 65 66 68  Resp: 18 20 20 18   Temp: 98.1 F (36.7 C) (!) 97.4 F (36.3 C) 98.2 F (36.8 C) 98.1 F (36.7 C)  TempSrc: Oral Oral Oral Oral  SpO2: 100% 99% 97% 94%  Weight:      Height:        Intake/Output Summary (Last 24 hours) at 05/21/2023 1322 Last data filed at 05/21/2023 1100 Gross per 24 hour  Intake 440 ml  Output 1850 ml  Net -1410 ml   Filed Weights   05/07/23 0933 05/10/23 1115  Weight: 87 kg 87 kg    Examination:  General exam: Appears calm and comfortable.  Pleasant interactive. Respiratory system: Clear to auscultation. Respiratory effort normal. Cardiovascular system: S1 & S2 heard,  Gastrointestinal system: Soft.  Nontender.  Bowel sound present. Central nervous system: Alert and oriented. No focal  neurological deficits. Extremities: Symmetric 5 x 5 power. Patient has left foot TMA wound on dry dressing.    Data Reviewed: I have personally reviewed following labs and imaging studies  CBC: Recent Labs  Lab 05/15/23 0112 05/17/23 0256 05/18/23 0319 05/18/23 1239 05/18/23 1732 05/19/23 0340 05/20/23 0307  WBC 6.9 6.5 5.9   --   --  7.2 7.1  NEUTROABS  --   --   --   --   --  5.2 4.8  HGB 8.0* 7.1* 6.7* 9.2* 7.5* 7.8* 7.3*  HCT 25.0* 21.4* 20.6* 27.0* 22.4* 24.4* 23.8*  MCV 95.1 94.3 94.9  --   --  96.1 98.3  PLT 238 189 187  --   --  176 171   Basic Metabolic Panel: Recent Labs  Lab 05/17/23 0256 05/18/23 0319 05/18/23 1239 05/19/23 0340 05/20/23 0307 05/21/23 0256  NA 128* 132* 138 130* 134* 131*  K 4.0 4.1 4.6 4.5 4.7 4.8  CL 107 108 114* 109 112* 110  CO2 14* 15*  --  13* 12* 14*  GLUCOSE 94 130* 109* 170* 74 121*  BUN 50* 49* 43* 56* 59* 58*  CREATININE 4.37* 4.46* 5.70* 5.36* 5.89* 5.98*  CALCIUM 7.9* 8.0*  --  8.0* 8.1* 7.7*  MG  --   --   --  1.9 1.9  --    GFR: Estimated Creatinine Clearance: 15 mL/min (A) (by C-G formula based on SCr of 5.98 mg/dL (H)). Liver Function Tests: Recent Labs  Lab 05/17/23 0256 05/18/23 0319 05/19/23 0340 05/20/23 0307 05/21/23 0256  AST 151* 147* 117* 57* 42*  ALT 149* 151* 150* 110* 96*  ALKPHOS 1,068* 1,103* 1,195* 965* 994*  BILITOT 14.5* 14.5* 13.7* 6.9* 5.6*  PROT 5.5* 5.5* 5.7* 5.5* 5.9*  ALBUMIN 2.1* 1.9* 2.0* 2.0* 1.9*   No results for input(s): "LIPASE", "AMYLASE" in the last 168 hours. No results for input(s): "AMMONIA" in the last 168 hours. Coagulation Profile: No results for input(s): "INR", "PROTIME" in the last 168 hours. Cardiac Enzymes: No results for input(s): "CKTOTAL", "CKMB", "CKMBINDEX", "TROPONINI" in the last 168 hours. BNP (last 3 results) No results for input(s): "PROBNP" in the last 8760 hours. HbA1C: No results for input(s): "HGBA1C" in the last 72 hours. CBG: Recent Labs  Lab 05/20/23 1634 05/20/23 2031 05/21/23 0114 05/21/23 0717 05/21/23 1144  GLUCAP 111* 161* 104* 82 91   Lipid Profile: No results for input(s): "CHOL", "HDL", "LDLCALC", "TRIG", "CHOLHDL", "LDLDIRECT" in the last 72 hours. Thyroid Function Tests: No results for input(s): "TSH", "T4TOTAL", "FREET4", "T3FREE", "THYROIDAB" in the last 72  hours. Anemia Panel: Recent Labs    05/21/23 0256  FERRITIN 321  TIBC 251  IRON 81   Sepsis Labs: No results for input(s): "PROCALCITON", "LATICACIDVEN" in the last 168 hours.  No results found for this or any previous visit (from the past 240 hours).       Radiology Studies: No results found.       Scheduled Meds:  amLODipine  5 mg Oral Daily   carvedilol  12.5 mg Oral BID WC   Chlorhexidine Gluconate Cloth  6 each Topical Daily   insulin aspart  0-6 Units Subcutaneous TID WC   insulin glargine-yfgn  6 Units Subcutaneous BID   multivitamin with minerals  1 tablet Oral Daily   pantoprazole  40 mg Oral Daily   sodium bicarbonate  650 mg Oral TID   sodium chloride flush  10-40 mL Intracatheter Q12H   Continuous Infusions:  LOS: 14 days    Time spent: 35 minutes    Dorcas Carrow, MD [facility]

## 2023-05-21 NOTE — Progress Notes (Signed)
Patient ID: Jeffrey Young, male   DOB: [birth year], 57 y.o.   MRN: [id number] S: He feels well.  Had good UOP over the last 24 hours.  Denies any N/V, dysgeusia, anorexia, SOB, edema, or malaise. O:BP (!) 156/81 (BP Location: Right Arm)   Pulse 68   Temp 98.1 F (36.7 C) (Oral)   Resp 18   Ht 6\' 1"  (1.854 m)   Wt 87 kg   SpO2 94%   BMI 25.30 kg/m   Intake/Output Summary (Last 24 hours) at 05/21/2023 1235 Last data filed at 05/21/2023 1100 Gross per 24 hour  Intake 440 ml  Output 1850 ml  Net -1410 ml   Intake/Output: I/O last 3 completed shifts: In: 360 [P.O.:360] Out: 2100 [Urine:2100]  Intake/Output this shift:  Total I/O In: 200 [P.O.:200] Out: 600 [Urine:600] Weight change:  Gen: NAD CVS: RRR Resp: CTA Abd: +BS, soft, NT/ND Ext: no edema  Recent Labs  Lab 05/15/23 0112 05/16/23 0316 05/17/23 0256 05/18/23 0319 05/18/23 1239 05/19/23 0340 05/20/23 0307 05/21/23 0256  NA 131* 128* 128* 132* 138 130* 134* 131*  K 4.0 3.9 4.0 4.1 4.6 4.5 4.7 4.8  CL 108 106 107 108 114* 109 112* 110  CO2 16* 15* 14* 15*  --  13* 12* 14*  GLUCOSE 119* 133* 94 130* 109* 170* 74 121*  BUN 44* 45* 50* 49* 43* 56* 59* 58*  CREATININE 4.30* 4.37* 4.37* 4.46* 5.70* 5.36* 5.89* 5.98*  ALBUMIN 2.0* 1.9* 2.1* 1.9*  --  2.0* 2.0* 1.9*  CALCIUM 8.0* 7.7* 7.9* 8.0*  --  8.0* 8.1* 7.7*  AST 189* 167* 151* 147*  --  117* 57* 42*  ALT 158* 154* 149* 151*  --  150* 110* 96*   Liver Function Tests: Recent Labs  Lab 05/19/23 0340 05/20/23 0307 05/21/23 0256  AST 117* 57* 42*  ALT 150* 110* 96*  ALKPHOS 1,195* 965* 994*  BILITOT 13.7* 6.9* 5.6*  PROT 5.7* 5.5* 5.9*  ALBUMIN 2.0* 2.0* 1.9*   No results for input(s): "LIPASE", "AMYLASE" in the last 168 hours. No results for input(s): "AMMONIA" in the last 168 hours. CBC: Recent Labs  Lab 05/15/23 0112 05/17/23 0256 05/18/23 0319 05/18/23 1239 05/18/23 1732 05/19/23 0340 05/20/23 0307  WBC 6.9 6.5 5.9  --   --  7.2 7.1   NEUTROABS  --   --   --   --   --  5.2 4.8  HGB 8.0* 7.1* 6.7*   < > 7.5* 7.8* 7.3*  HCT 25.0* 21.4* 20.6*   < > 22.4* 24.4* 23.8*  MCV 95.1 94.3 94.9  --   --  96.1 98.3  PLT 238 189 187  --   --  176 171   < > = values in this interval not displayed.   Cardiac Enzymes: No results for input(s): "CKTOTAL", "CKMB", "CKMBINDEX", "TROPONINI" in the last 168 hours. CBG: Recent Labs  Lab 05/20/23 1634 05/20/23 2031 05/21/23 0114 05/21/23 0717 05/21/23 1144  GLUCAP 111* 161* 104* 82 91    Iron Studies:  Recent Labs    05/21/23 0256  IRON 81  TIBC 251  FERRITIN 321   Studies/Results: No results found.  amLODipine  5 mg Oral Daily   carvedilol  12.5 mg Oral BID WC   Chlorhexidine Gluconate Cloth  6 each Topical Daily   insulin aspart  0-6 Units Subcutaneous TID WC   insulin glargine-yfgn  6 Units Subcutaneous BID   multivitamin with minerals  1 tablet Oral  Daily   pantoprazole  40 mg Oral Daily   sodium chloride flush  10-40 mL Intracatheter Q12H    BMET    Component Value Date/Time   NA 131 (L) 05/21/2023 0256   K 4.8 05/21/2023 0256   CL 110 05/21/2023 0256   CO2 14 (L) 05/21/2023 0256   GLUCOSE 121 (H) 05/21/2023 0256   BUN 58 (H) 05/21/2023 0256   CREATININE 5.98 (H) 05/21/2023 0256   CREATININE 3.53 (H) 11/06/2022 0814   CALCIUM 7.7 (L) 05/21/2023 0256   GFRNONAA 10 (L) 05/21/2023 0256   GFRNONAA 19 (L) 11/06/2022 0814   GFRAA >60 01/26/2018 1339   CBC    Component Value Date/Time   WBC 7.1 05/20/2023 0307   RBC 2.42 (L) 05/20/2023 0307   HGB 7.3 (L) 05/20/2023 0307   HGB 9.6 (L) 11/06/2022 0814   HCT 23.8 (L) 05/20/2023 0307   PLT 171 05/20/2023 0307   PLT 215 11/06/2022 0814   MCV 98.3 05/20/2023 0307   MCH 30.2 05/20/2023 0307   MCHC 30.7 05/20/2023 0307   RDW 21.3 (H) 05/20/2023 0307   LYMPHSABS 1.1 05/20/2023 0307   MONOABS 0.7 05/20/2023 0307   EOSABS 0.3 05/20/2023 0307   BASOSABS 0.1 05/20/2023 0307     Assessment/Plan:  AKI/CKD  stage IV - 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  Good UOP and no uremic symptoms.  Scr appears to have reached a plateau and hopefully will start to see improvement never the next 24-48 hours.  No indication for dialysis at this time.  Will continue to follow.   Avoid nephrotoxic medications including NSAIDs and iodinated intravenous contrast exposure unless the latter is absolutely indicated.   Preferred narcotic agents for pain control are hydromorphone, fentanyl, and methadone. Morphine should not be used.  Avoid Baclofen and avoid oral sodium phosphate and magnesium citrate based laxatives / bowel preps.  Continue strict Input and Output monitoring. Will monitor the patient closely with you and intervene or adjust therapy as indicated by changes in clinical status/labs   CKD stage IV - has had progressive CKD since 2019.  Unclear etiology but does have h/o DM, HTN, and has had several cycles of chemo for his colon cancer.  He will need to follow up with our office after discharge. 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  Metastatic colon cancer - followed by Dr. Truett Perna. Anemia of malignancy - will defer to Dr. Truett Perna. Metabolic acidosis - will start sodium bicarb and follow.  HTN - stable, avoid ACE/ARB for now. DM type 2 - per primary  Irena Cords, MD [facility]

## 2023-05-22 ENCOUNTER — Other Ambulatory Visit: Payer: Self-pay | Admitting: *Deleted

## 2023-05-22 DIAGNOSIS — C189 Malignant neoplasm of colon, unspecified: Secondary | ICD-10-CM

## 2023-05-22 DIAGNOSIS — K831 Obstruction of bile duct: Secondary | ICD-10-CM | POA: Diagnosis not present

## 2023-05-22 DIAGNOSIS — C801 Malignant (primary) neoplasm, unspecified: Secondary | ICD-10-CM | POA: Diagnosis not present

## 2023-05-22 LAB — CBC WITH DIFFERENTIAL/PLATELET
Abs Immature Granulocytes: 0.07 10*3/uL (ref 0.00–0.07)
Basophils Absolute: 0.1 10*3/uL (ref 0.0–0.1)
Basophils Relative: 1 %
Eosinophils Absolute: 0.3 10*3/uL (ref 0.0–0.5)
Eosinophils Relative: 3 %
HCT: 22.7 % — ABNORMAL LOW (ref 39.0–52.0)
Hemoglobin: 7.1 g/dL — ABNORMAL LOW (ref 13.0–17.0)
Immature Granulocytes: 1 %
Lymphocytes Relative: 12 %
Lymphs Abs: 0.9 10*3/uL (ref 0.7–4.0)
MCH: 31.3 pg (ref 26.0–34.0)
MCHC: 31.3 g/dL (ref 30.0–36.0)
MCV: 100 fL (ref 80.0–100.0)
Monocytes Absolute: 0.9 10*3/uL (ref 0.1–1.0)
Monocytes Relative: 12 %
Neutro Abs: 5.4 10*3/uL (ref 1.7–7.7)
Neutrophils Relative %: 71 %
Platelets: 175 10*3/uL (ref 150–400)
RBC: 2.27 MIL/uL — ABNORMAL LOW (ref 4.22–5.81)
RDW: 20.2 % — ABNORMAL HIGH (ref 11.5–15.5)
WBC: 7.6 10*3/uL (ref 4.0–10.5)
nRBC: 0 % (ref 0.0–0.2)

## 2023-05-22 LAB — GLUCOSE, CAPILLARY
Glucose-Capillary: 49 mg/dL — ABNORMAL LOW (ref 70–99)
Glucose-Capillary: 59 mg/dL — ABNORMAL LOW (ref 70–99)
Glucose-Capillary: 128 mg/dL — ABNORMAL HIGH (ref 70–99)
Glucose-Capillary: 111 mg/dL — ABNORMAL HIGH (ref 70–99)
Glucose-Capillary: 146 mg/dL — ABNORMAL HIGH (ref 70–99)

## 2023-05-22 LAB — COMPREHENSIVE METABOLIC PANEL
ALT: 83 U/L — ABNORMAL HIGH (ref 0–44)
AST: 41 U/L (ref 15–41)
Albumin: 2.1 g/dL — ABNORMAL LOW (ref 3.5–5.0)
Alkaline Phosphatase: 890 U/L — ABNORMAL HIGH (ref 38–126)
Anion gap: 6 (ref 5–15)
BUN: 58 mg/dL — ABNORMAL HIGH (ref 6–20)
CO2: 13 mmol/L — ABNORMAL LOW (ref 22–32)
Calcium: 7.5 mg/dL — ABNORMAL LOW (ref 8.9–10.3)
Chloride: 112 mmol/L — ABNORMAL HIGH (ref 98–111)
Creatinine, Ser: 5.06 mg/dL — ABNORMAL HIGH (ref 0.61–1.24)
GFR, Estimated: 12 mL/min — ABNORMAL LOW (ref >60)
Glucose, Bld: 81 mg/dL (ref 70–99)
Potassium: 5.1 mmol/L (ref 3.5–5.1)
Sodium: 131 mmol/L — ABNORMAL LOW (ref 135–145)
Total Bilirubin: 5.7 mg/dL — ABNORMAL HIGH (ref 0.0–1.2)
Total Protein: 5.8 g/dL — ABNORMAL LOW (ref 6.5–8.1)

## 2023-05-22 LAB — PREPARE RBC (CROSSMATCH)

## 2023-05-22 MED ORDER — DEXTROSE 50 % IV SOLN
INTRAVENOUS | Status: AC
  Administered 2023-05-22: 50 mL via INTRAVENOUS
  Filled 2023-05-22: qty 50

## 2023-05-22 MED ORDER — SODIUM BICARBONATE 650 MG PO TABS: 650.0000 mg | ORAL_TABLET | Freq: Three times a day (TID) | ORAL | 0 refills | Status: AC

## 2023-05-22 MED ORDER — HEPARIN SOD (PORK) LOCK FLUSH 100 UNIT/ML IV SOLN
500.0000 [IU] | INTRAVENOUS | Status: AC | PRN
  Administered 2023-05-22: 500 [IU]

## 2023-05-22 MED ORDER — SODIUM CHLORIDE 0.9% IV SOLUTION: Freq: Once | INTRAVENOUS | Status: AC

## 2023-05-22 MED ORDER — DEXTROSE 50 % IV SOLN: 50.0000 mL | Freq: Once | INTRAVENOUS | Status: AC

## 2023-05-22 NOTE — Discharge Summary (Signed)
 Physician Discharge Summary  Jeffrey Young FMW:[id number] DOB: 1966-01-24 DOA: 05/07/2023  PCP: Pura Lenis, MD  Admit date: 05/07/2023 Discharge date: 05/22/2023  Admitted From: Home Disposition: Home  Recommendations for Outpatient Follow-up:  Follow up with PCP in 1-2 weeks Follow-up at cancer center.  Repeat CMP and CBC next week.  Home Health: N/A Equipment/Devices: N/A  Discharge Condition: Stable CODE STATUS: Full code Diet recommendation: Regular diet, nutritional supplements, low-carb.  Discharge summary: 57 year old gentleman with previously treated stage III colon cancer, type 2 diabetes on insulin , left foot wound, CKD stage IV, hypertension presented with bloody urine however he was noted to have jaundiced with no blood in the urine.  Bilirubin was 8.  Mildly elevated creatinine from his baseline.  Imaging suggested possible cholecystitis. Patient underwent ERCP and stenting of the bile duct on 12/19, he had persistently elevated bilirubin. Repeat ERCP on 12/27 with improvement of symptoms and liver function test.  Bilirubin is appropriately trending down. He remained in the hospital due to worsening renal functions and they are stabilizing now. Also received multiple transfusions, on third PRBC today.  #1. Obstructive jaundice due to malignancy: 12/19, status post ERCP that showed CBD obstruction due to mass, stenting done previous and FNA of the lymph nodes.  Pathology consistent with adenocarcinoma with colon cancer as primary.  LFTs continued to rise, MRCP showed possible biliary stent migration or occlusion.  Underwent repeat ERCP with stenting.  Bilirubin level is stable or downtrending now.   This is likely metastatic colon cancer, oncology is following. Patient will need repeat chemistries next week to ensure stabilization.  He is scheduled to follow-up at cancer center.  #2. AKI on CKD stage IV with metabolic acidosis: Recent known creatinine of 3.1-3.5.   Admitted with creatinine of 5.02.  Remained elevated.  Multifactorial.  Peaked at 5.9.  Trending down now.  Adequate urine output.  No uremic symptoms.  Potassium normal.  Seen by nephrology.  Advised to discharge on bicarbonate.  Recheck in 1 week.  Nephrology to schedule follow-up.   #3. Anemia of chronic disease: Received total 2 units of PRBC.  Hemoglobin 7.1.   Due to underlying cancer, patient unable to have Procrit.  Hemoglobin 7.1.  Will transfuse 1 unit of PRBC today before discharge.  Recheck hemoglobin in 1 week.   Hypertension: Blood pressure stable on carvedilol  and amlodipine .   Type 2 diabetes with hypoglycemia.  Recurrent hypoglycemia in the hospital due to missing meals, n.p.o. etc.  Patient will resume his home dose of insulin  tomorrow if eating adequate.  He also needs close monitoring at home.   Left foot wound, previous left TMA.  Present on admission: Dressing instructions attached.  Stable for discharge.  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   Essential hypertension   Iron deficiency anemia   Open wound of left foot    Discharge Instructions  Discharge Instructions     Diet - low sodium heart healthy   Complete by: As directed    Discharge instructions   Complete by: As directed    Start taking insulin  tomorrow morning if eating well and blood sugars are more than 100   Discharge wound care:   Complete by: As directed    Cleanse L plantar foot ulcer with  NS, apply Betadine  ointment to callused skin surrounding ulcer, cut a small piece of silver hydrofiber to fit wound bed daily Soila [phone]), cover with dry gauze and wrap in Kerlix roll gauze to secure   Increase activity slowly   Complete by: As directed        Allergies as of 05/22/2023       Reactions   Bee Venom Anaphylaxis, Swelling, Other (See Comments)   Cold Sweats, also   Latex Rash        Medication List     STOP taking these medications    atorvastatin 10 MG tablet Commonly known as: LIPITOR   Klor-Con  M20 20 MEQ tablet Generic drug: potassium chloride  SA   ondansetron  8 MG tablet Commonly known as: ZOFRAN        TAKE these medications    Accu-Chek Guide test strip Generic drug: glucose blood USE AS INSTRUCTED TO CHECK BLOOD SUGAR 2 TIMES DAILY   amLODipine  5 MG tablet Commonly known as: NORVASC  Take 1 tablet (5 mg total) by mouth daily.   cadexomer iodine  0.9 % gel Commonly known as: IODOSORB Apply 1 Application topically See admin instructions. Apply to the left foot every other day as a part of wound care   carvedilol  12.5 MG tablet Commonly known as: COREG  Take 12.5 mg by mouth 2 (two) times daily with a meal.   Dexcom G6 Sensor Misc Inject 1 Device into the skin See admin instructions. Place a new sensor into the skin every 10 days   docusate sodium  100 MG capsule Commonly known as: COLACE Take 100 mg by mouth daily as needed for mild constipation.   Lantus  SoloStar 100 UNIT/ML Solostar Pen Generic drug: insulin  glargine Inject 10-15 Units into the skin See admin instructions. Inject 15 units into the skin in the morning and 10 units at bedtime   pantoprazole  40 MG tablet Commonly known as: PROTONIX  Take 40 mg by mouth 2 (two) times daily.   prochlorperazine  10 MG tablet Commonly known as: COMPAZINE  Take 1 tablet (10 mg total) by mouth every 6 (six) hours as needed for nausea or vomiting.   sodium bicarbonate  650 MG tablet Take 1 tablet (650 mg total) by mouth 3 (three) times daily.   sodium chloride  irrigation 0.9 % irrigation Irrigate with 15 mLs as directed See admin instructions. IRRIGATE USING 15 ML'S ON AFFECTED AREA OF THE LEFT FOOT EVERY OTHER DAY- DRY SURROUNDING SKIN, THEN  APPLY IODOSORB GEL               Discharge Care Instructions  (From admission, onward)           Start     Ordered   05/22/23 0000  Discharge wound care:       Comments: Cleanse L plantar foot ulcer with NS, apply Betadine  ointment to callused skin surrounding ulcer, cut a small piece of silver hydrofiber to fit wound bed daily Soila [phone]), cover with dry gauze and wrap in Kerlix roll gauze to secure   05/22/23 1248            Allergies  Allergen Reactions   Bee Venom Anaphylaxis, Swelling and Other (See Comments)    Cold Sweats, also   Latex Rash    Consultations: Oncology GI Nephrology   Procedures/Studies: DG ERCP Result Date: 05/18/2023 CLINICAL DATA:  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 EXAM: ERCP TECHNIQUE: Multiple spot images obtained with the fluoroscopic device and submitted for interpretation  post-procedure. COMPARISON:  Prior ERCP on 05/10/2023 FINDINGS: Imaging with a C-arm demonstrates cannulation of the common bile duct, are retrieval of a pre-existing endoscopic biliary stent and placement new biliary stents in the common bile duct. IMPRESSION: Imaging during endoscopic common bile duct stent retrieval and replacement. These images were submitted for radiologic interpretation only. Please see the procedural report for the amount of contrast and the fluoroscopy time utilized. Electronically Signed   By: Marcey Moan M.D.   On: 05/18/2023 17:17   MR ABDOMEN MRCP W WO CONTAST Result Date: 05/15/2023 CLINICAL DATA:  Obstructive jaundice due to malignant neoplasm. Recent ERCP with biliary stent placement. EXAM: MRI ABDOMEN WITHOUT AND WITH CONTRAST (INCLUDING MRCP) TECHNIQUE: Multiplanar multisequence MR imaging of the abdomen was performed both before and after the administration of intravenous contrast. Heavily T2-weighted images of the biliary and pancreatic ducts were obtained, and  three-dimensional MRCP images were rendered by post processing. CONTRAST:  8mL GADAVIST  GADOBUTROL  1 MMOL/ML IV SOLN COMPARISON:  MRCP 05/08/2023, abdominopelvic CT 05/07/2023, ultrasound 05/14/2023 and images from ERCP 05/10/2023. FINDINGS: Technical note: Despite efforts by the technologist and patient, moderate motion artifact is present on today's exam and could not be eliminated. This reduces exam sensitivity and specificity. Lower chest: New small bilateral pleural effusions with associated dependent atelectasis at both lung bases. Hepatobiliary: The liver as a non cirrhotic morphology, without significant steatosis. Although assessment is mildly limited by the motion artifact, no focal liver lesions are identified on today's study to suggest metastatic disease. There is persistent at least moderate intra and extrahepatic biliary dilatation, similar to the previous MRI. The common bile duct measures up to 1.5 cm in diameter. There is an abrupt transition in duct caliber with shelving above the pancreatic head, consistent with known malignant biliary stricture. The biliary stent is not well visualized (questionably seen on image 20 of series 11). Cannot exclude stent migration given the persistent biliary dilatation. No well-defined mass is identified, although there is an approximately 1.6 cm area of restricted diffusion and decreased enhancement at the level of the biliary obstruction (image 47/15). Mild gallbladder distension and wall thickening, slightly improved from previous MRI. Pancreas: No evidence of pancreatic mass, pancreatic ductal dilatation or surrounding inflammation. Spleen: Normal in size without focal abnormality. Adrenals/Urinary Tract: Both adrenal glands appear normal. No evidence of renal mass or hydronephrosis. Unchanged renal cysts bilaterally for which no specific follow-up imaging is recommended. Stomach/Bowel: The stomach appears unremarkable for its degree of distension. No evidence  of bowel wall thickening, distention or surrounding inflammatory change. Vascular/Lymphatic: Again demonstrated are multiple enlarged peripancreatic and porta hepatis lymph nodes, measuring up to 1.8 cm short axis on image 18/6. These are most obvious on the diffusion-weighted images. The portal, superior mesenteric and splenic veins are patent without evidence of tumor encasement. No significant arterial abnormalities are seen. Other: Mild body wall edema without ascites or focal extraluminal fluid collection. Musculoskeletal: No acute or significant osseous findings. IMPRESSION: 1. Persistent at least moderate intra and extrahepatic biliary dilatation with abrupt transition in duct caliber above the pancreatic head, consistent with known malignant biliary stricture. The biliary stent is not well visualized. Cannot exclude stent migration given the persistent biliary dilatation. Suggest radiographic follow-up. 2. No well-defined mass is identified, although there is an approximately 1.6 cm area of restricted diffusion and decreased enhancement at the level of the biliary obstruction. 3. Persistent peripancreatic and porta hepatis adenopathy, consistent with metastatic disease. 4. No definite evidence of hepatic metastatic disease on today's  motion limited examination. 5. New small bilateral pleural effusions with associated dependent atelectasis at both lung bases. Electronically Signed   By: Elsie Perone M.D.   On: 05/15/2023 14:32   MR 3D Recon At Scanner Result Date: 05/15/2023 CLINICAL DATA:  Obstructive jaundice due to malignant neoplasm. Recent ERCP with biliary stent placement. EXAM: MRI ABDOMEN WITHOUT AND WITH CONTRAST (INCLUDING MRCP) TECHNIQUE: Multiplanar multisequence MR imaging of the abdomen was performed both before and after the administration of intravenous contrast. Heavily T2-weighted images of the biliary and pancreatic ducts were obtained, and three-dimensional MRCP images were rendered  by post processing. CONTRAST:  8mL GADAVIST  GADOBUTROL  1 MMOL/ML IV SOLN COMPARISON:  MRCP 05/08/2023, abdominopelvic CT 05/07/2023, ultrasound 05/14/2023 and images from ERCP 05/10/2023. FINDINGS: Technical note: Despite efforts by the technologist and patient, moderate motion artifact is present on today's exam and could not be eliminated. This reduces exam sensitivity and specificity. Lower chest: New small bilateral pleural effusions with associated dependent atelectasis at both lung bases. Hepatobiliary: The liver as a non cirrhotic morphology, without significant steatosis. Although assessment is mildly limited by the motion artifact, no focal liver lesions are identified on today's study to suggest metastatic disease. There is persistent at least moderate intra and extrahepatic biliary dilatation, similar to the previous MRI. The common bile duct measures up to 1.5 cm in diameter. There is an abrupt transition in duct caliber with shelving above the pancreatic head, consistent with known malignant biliary stricture. The biliary stent is not well visualized (questionably seen on image 20 of series 11). Cannot exclude stent migration given the persistent biliary dilatation. No well-defined mass is identified, although there is an approximately 1.6 cm area of restricted diffusion and decreased enhancement at the level of the biliary obstruction (image 47/15). Mild gallbladder distension and wall thickening, slightly improved from previous MRI. Pancreas: No evidence of pancreatic mass, pancreatic ductal dilatation or surrounding inflammation. Spleen: Normal in size without focal abnormality. Adrenals/Urinary Tract: Both adrenal glands appear normal. No evidence of renal mass or hydronephrosis. Unchanged renal cysts bilaterally for which no specific follow-up imaging is recommended. Stomach/Bowel: The stomach appears unremarkable for its degree of distension. No evidence of bowel wall thickening, distention or  surrounding inflammatory change. Vascular/Lymphatic: Again demonstrated are multiple enlarged peripancreatic and porta hepatis lymph nodes, measuring up to 1.8 cm short axis on image 18/6. These are most obvious on the diffusion-weighted images. The portal, superior mesenteric and splenic veins are patent without evidence of tumor encasement. No significant arterial abnormalities are seen. Other: Mild body wall edema without ascites or focal extraluminal fluid collection. Musculoskeletal: No acute or significant osseous findings. IMPRESSION: 1. Persistent at least moderate intra and extrahepatic biliary dilatation with abrupt transition in duct caliber above the pancreatic head, consistent with known malignant biliary stricture. The biliary stent is not well visualized. Cannot exclude stent migration given the persistent biliary dilatation. Suggest radiographic follow-up. 2. No well-defined mass is identified, although there is an approximately 1.6 cm area of restricted diffusion and decreased enhancement at the level of the biliary obstruction. 3. Persistent peripancreatic and porta hepatis adenopathy, consistent with metastatic disease. 4. No definite evidence of hepatic metastatic disease on today's motion limited examination. 5. New small bilateral pleural effusions with associated dependent atelectasis at both lung bases. Electronically Signed   By: Elsie Perone M.D.   On: 05/15/2023 14:32   US  Abdomen Limited RUQ (LIVER/GB) Result Date: 05/14/2023 CLINICAL DATA:  Biliary obstruction due to malignant neoplasm EXAM: ULTRASOUND ABDOMEN LIMITED RIGHT  UPPER QUADRANT COMPARISON:  Ultrasound 05/08/2023.  CT 05/07/2023.  MRI 05/08/2023 FINDINGS: Gallbladder: Gallbladder is somewhat contracted and filled with heterogeneous material. This is likely combination of stones and sludge. There is progression since the previous study. No gallbladder wall thickening or pericholecystic edema. Murphy's sign is negative.  Common bile duct: Diameter: 14 mm, dilated. Liver: Intrahepatic bile duct dilatation is demonstrated. Focal liver lesion seen at previous imaging studies are not demonstrated on the current examination. Portal vein is patent on color Doppler imaging with normal direction of blood flow towards the liver. Other: None. IMPRESSION: 1. Somewhat contracted gallbladder filled with heterogeneous material likely representing stones and sludge. This is progressed since the prior study. 2. Intra and extrahepatic bile duct dilatation consistent with changes due to known biliary malignancy. Electronically Signed   By: Elsie Gravely M.D.   On: 05/14/2023 23:10   DG ERCP Result Date: 05/10/2023 CLINICAL DATA:  ERCP EXAM: ERCP TECHNIQUE: Multiple spot images obtained with the fluoroscopic device and submitted for interpretation post-procedure. FLUOROSCOPY TIME: FLUOROSCOPY TIME 1 minute, 20 seconds (30.8 mGy) COMPARISON:  MRCP-05/08/2023 FINDINGS: 8 spot fluoroscopic images of the right upper abdominal quadrant during ERCP are provided for review. Initial image demonstrates an ERCP probe overlying the right upper abdominal quadrant. Subsequent images demonstrate selective cannulation and opacification of the central aspect of the common hepatic duct with opacification of the intrahepatic biliary tree which appears dilated. Completion images demonstrate placement of a plastic stent overlying the expected location of the CBD. IMPRESSION: ERCP with biliary stent placement as above. These images were submitted for radiologic interpretation only. Please see the procedural report for the amount of contrast and the fluoroscopy time utilized. Electronically Signed   By: Norleen Roulette M.D.   On: 05/10/2023 15:08   US  Abdomen Limited RUQ (LIVER/GB) Result Date: 05/08/2023 CLINICAL DATA:  Painless jaundice Cholecystitis EXAM: ULTRASOUND ABDOMEN LIMITED RIGHT UPPER QUADRANT COMPARISON:  MRI abdomen nodule 06/07/2022 FINDINGS:  Gallbladder: Gallstones: Present Sludge: Present Gallbladder Wall: Within normal limits Pericholecystic fluid: Minimal Sonographic Murphy's Sign: Negative per technologist Common bile duct: Diameter: 13 mm Liver: Parenchymal echogenicity: Within normal limits Contours: Normal Lesions: None Portal vein: Patent.  Hepatopetal flow Other: None. IMPRESSION: 1. Cholelithiasis without definitive evidence of acute cholecystitis. If there is continued suspicion for cholecystitis, further evaluation with HIDA scan would be beneficial. 2. Dilated common bile duct again seen which remains suspicious for stricture/mass. Electronically Signed   By: Aliene Lloyd M.D.   On: 05/08/2023 10:14   MR ABDOMEN MRCP WO CONTRAST Result Date: 05/08/2023 CLINICAL DATA:  Inpatient. Jaundice. Chronic kidney disease. Metastatic colon cancer. EXAM: MRI ABDOMEN WITHOUT CONTRAST  (INCLUDING MRCP) TECHNIQUE: Multiplanar multisequence MR imaging of the abdomen was performed. Heavily T2-weighted images of the biliary and pancreatic ducts were obtained, and three-dimensional MRCP images were rendered by post processing. COMPARISON:  05/07/2023 CT abdomen/pelvis. FINDINGS: Lower chest: Trace posterior right pleural effusion. Hepatobiliary: Normal liver size and configuration. No hepatic steatosis. There is a solitary 1.1 x 0.9 cm anterior segment 2 left liver mass (series 7/image 10 and best seen on diffusion sequence series 5/image 10), worrisome for liver metastasis. No additional liver lesions. Mild gallbladder distention. Moderate diffuse gallbladder wall thickening. Minimal pericholecystic fluid and fat stranding. Small amount of layering sludge in the gallbladder. No gallstones. Moderate diffuse intrahepatic biliary ductal dilatation. Dilated proximal common bile duct with diameter 13 mm. Abrupt malignant-appearing biliary stricture at the level of the middle third of the common bile duct with diminutive caliber  of the lower third of the  common bile duct (1 mm diameter). No choledocholithiasis. There is a mildly enlarged peripancreatic/porta hepatis 1.1 cm pathologic lymph node abutting the site of the common bile duct stricture (series 3/image 16). Pancreas: No pancreatic mass or duct dilation. No peripancreatic fluid collections. Unable to confidently assess for pancreas divisum given motion degradation on the MRCP sequences. Spleen: Normal size. No mass. Adrenals/Urinary Tract: Normal adrenals. No hydronephrosis. Small simple bilateral renal cysts, largest 3.5 cm in the interpolar right kidney, for which no follow-up imaging is recommended. Stomach/Bowel: Normal non-distended stomach. Visualized small and large bowel is normal caliber, with no bowel wall thickening. Partially visualized postsurgical changes from partial left colectomy. Vascular/Lymphatic: Normal caliber abdominal aorta. Multiple mildly enlarged porta hepatis nodes up to 1.9 cm (series 5/image 16). Mildly enlarged 1.0 cm portacaval node (series 5/image 22). Other: No abdominal ascites or focal fluid collection. Musculoskeletal: No aggressive appearing focal osseous lesions. IMPRESSION: 1. Abrupt malignant-appearing biliary stricture at the level of the middle third of the common bile duct, with adjacent porta hepatis adenopathy. Moderate diffuse intrahepatic and proximal extrahepatic biliary ductal dilatation. No choledocholithiasis. 2. Mild gallbladder distention with moderate diffuse gallbladder wall thickening. Minimal pericholecystic fluid and fat stranding. Small amount of layering sludge in the gallbladder. No gallstones. Findings are nonspecific, with differential including acute cholecystitis versus reactive changes to biliary obstruction. 3. Solitary 1.1 cm anterior segment 2 left liver mass, worrisome for liver metastasis. 4. Multiple mildly enlarged porta hepatis and portacaval nodes, suspicious for metastatic adenopathy. 5. Trace posterior right pleural effusion.  Electronically Signed   By: Selinda DELENA Blue M.D.   On: 05/08/2023 09:03   MR 3D Recon At Scanner Result Date: 05/08/2023 CLINICAL DATA:  Inpatient. Jaundice. Chronic kidney disease. Metastatic colon cancer. EXAM: MRI ABDOMEN WITHOUT CONTRAST  (INCLUDING MRCP) TECHNIQUE: Multiplanar multisequence MR imaging of the abdomen was performed. Heavily T2-weighted images of the biliary and pancreatic ducts were obtained, and three-dimensional MRCP images were rendered by post processing. COMPARISON:  05/07/2023 CT abdomen/pelvis. FINDINGS: Lower chest: Trace posterior right pleural effusion. Hepatobiliary: Normal liver size and configuration. No hepatic steatosis. There is a solitary 1.1 x 0.9 cm anterior segment 2 left liver mass (series 7/image 10 and best seen on diffusion sequence series 5/image 10), worrisome for liver metastasis. No additional liver lesions. Mild gallbladder distention. Moderate diffuse gallbladder wall thickening. Minimal pericholecystic fluid and fat stranding. Small amount of layering sludge in the gallbladder. No gallstones. Moderate diffuse intrahepatic biliary ductal dilatation. Dilated proximal common bile duct with diameter 13 mm. Abrupt malignant-appearing biliary stricture at the level of the middle third of the common bile duct with diminutive caliber of the lower third of the common bile duct (1 mm diameter). No choledocholithiasis. There is a mildly enlarged peripancreatic/porta hepatis 1.1 cm pathologic lymph node abutting the site of the common bile duct stricture (series 3/image 16). Pancreas: No pancreatic mass or duct dilation. No peripancreatic fluid collections. Unable to confidently assess for pancreas divisum given motion degradation on the MRCP sequences. Spleen: Normal size. No mass. Adrenals/Urinary Tract: Normal adrenals. No hydronephrosis. Small simple bilateral renal cysts, largest 3.5 cm in the interpolar right kidney, for which no follow-up imaging is recommended.  Stomach/Bowel: Normal non-distended stomach. Visualized small and large bowel is normal caliber, with no bowel wall thickening. Partially visualized postsurgical changes from partial left colectomy. Vascular/Lymphatic: Normal caliber abdominal aorta. Multiple mildly enlarged porta hepatis nodes up to 1.9 cm (series 5/image 16). Mildly enlarged 1.0 cm portacaval  node (series 5/image 22). Other: No abdominal ascites or focal fluid collection. Musculoskeletal: No aggressive appearing focal osseous lesions. IMPRESSION: 1. Abrupt malignant-appearing biliary stricture at the level of the middle third of the common bile duct, with adjacent porta hepatis adenopathy. Moderate diffuse intrahepatic and proximal extrahepatic biliary ductal dilatation. No choledocholithiasis. 2. Mild gallbladder distention with moderate diffuse gallbladder wall thickening. Minimal pericholecystic fluid and fat stranding. Small amount of layering sludge in the gallbladder. No gallstones. Findings are nonspecific, with differential including acute cholecystitis versus reactive changes to biliary obstruction. 3. Solitary 1.1 cm anterior segment 2 left liver mass, worrisome for liver metastasis. 4. Multiple mildly enlarged porta hepatis and portacaval nodes, suspicious for metastatic adenopathy. 5. Trace posterior right pleural effusion. Electronically Signed   By: Selinda DELENA Blue M.D.   On: 05/08/2023 09:03   CT ABDOMEN PELVIS WO CONTRAST Result Date: 05/07/2023 CLINICAL DATA:  History of colon cancer, metastatic. No which on this and worsening renal failure. * Tracking Code: BO * EXAM: CT ABDOMEN AND PELVIS WITHOUT CONTRAST TECHNIQUE: Multidetector CT imaging of the abdomen and pelvis was performed following the standard protocol without IV contrast. RADIATION DOSE REDUCTION: This exam was performed according to the departmental dose-optimization program which includes automated exposure control, adjustment of the mA and/or kV according to  patient size and/or use of iterative reconstruction technique. COMPARISON:  03/05/2023 FINDINGS: Lower chest: Dependent atelectasis in the lung bases. Hepatobiliary: Intrahepatic biliary duct dilatation is new in the interval. There may be some associated periportal edema. Portal venous anatomy is prominent and ill-defined. Density in the portal vein is similar to background blood pool density in the aorta. Common duct is dilated up to 15 mm diameter and appears to abruptly terminate prior to entering the head of the pancreas. Gallbladder is distended with ill definition of the gallbladder wall and potential pericholecystic edema/fluid. Pancreas: No focal mass lesion. No dilatation of the main duct. No intraparenchymal cyst. No peripancreatic edema. Spleen: No splenomegaly. No suspicious focal mass lesion. Adrenals/Urinary Tract: No adrenal nodule or mass. Stable right renal cyst. No followup imaging is recommended. Left kidney unremarkable on noncontrast imaging. No hydroureteronephrosis. Bladder is decompressed. Stomach/Bowel: Mild circumferential wall thickening noted distal esophagus. Stomach is unremarkable. No gastric wall thickening. No evidence of outlet obstruction. Duodenum is normally positioned as is the ligament of Treitz. No small bowel wall thickening. No small bowel dilatation. The terminal ileum is normal. The appendix is normal. Status post left hemicolectomy with reanastomosis. Vascular/Lymphatic: No abdominal aortic aneurysm. No abdominal aortic atherosclerotic calcification. Small lymph nodes in the gastrohepatic ligament (image 21/2) are similar to prior. Small lymph nodes also noted hepatoduodenal ligament. Index 14 mm short axis hepatoduodenal ligament node measured previously is 16 mm on 27/2 today. Index left external iliac lymph node measured previously at 14 mm short axis is 14 mm again today on 76/2. Other small lymph nodes along the left pelvic sidewall again noted. Reproductive: The  prostate gland and seminal vesicles are unremarkable. Other: No intraperitoneal free fluid. Musculoskeletal: Small left groin hernia contains only fat. No worrisome lytic or sclerotic osseous abnormality. IMPRESSION: 1. New intrahepatic and extrahepatic biliary duct dilatation with possible associated periportal edema. Common duct is dilated up to 15 mm diameter and appears to abruptly terminate prior to entering the head of the pancreas. Imaging features are compatible with biliary obstruction, potentially secondary to the hepatoduodenal ligament lymphadenopathy seen previously. Assessment is limited due to lack of intravenous contrast material. 2. Distended gallbladder with possible gallbladder wall  thickening and pericholecystic edema. Right upper quadrant ultrasound recommended to evaluate for acute cholecystitis. 3. No substantial change in lymphadenopathy of the gastrohepatic ligament, hepatoduodenal ligament, and left external iliac chain. 4. Tiny hypoattenuating foci seen previously in the dome of the left liver are not evident on the current exam. 5. Mild circumferential wall thickening distal esophagus. Esophagitis would be a consideration. 6. Small left groin hernia contains only fat. Electronically Signed   By: Camellia Candle M.D.   On: 05/07/2023 14:04   (Echo, Carotid, EGD, Colonoscopy, ERCP)    Subjective: Patient seen in the morning rounds.  Denies any complaints.  Eager to go home.   Discharge Exam: Vitals:   05/22/23 1124 05/22/23 1140  BP: (!) 150/82 (!) 148/88  Pulse: 69 64  Resp: 15 14  Temp: (!) 97.5 F (36.4 C) (!) 97.4 F (36.3 C)  SpO2: 99% 98%   Vitals:   05/21/23 1144 05/21/23 2019 05/22/23 1124 05/22/23 1140  BP: (!) 156/81 (!) 145/79 (!) 150/82 (!) 148/88  Pulse: 68 62 69 64  Resp: 18 16 15 14   Temp: 98.1 F (36.7 C) 97.6 F (36.4 C) (!) 97.5 F (36.4 C) (!) 97.4 F (36.3 C)  TempSrc: Oral Oral Axillary Axillary  SpO2: 94% 97% 99% 98%  Weight:      Height:         General: Pt is alert, awake, not in acute distress Cardiovascular: RRR, S1/S2 +, no rubs, no gallops Respiratory: CTA bilaterally, no wheezing, no rhonchi Abdominal: Soft, NT, ND, bowel sounds + Extremities: no edema, no cyanosis Left foot TMA amputation, 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    The results of significant diagnostics from this hospitalization (including imaging, microbiology, ancillary and laboratory) are listed below for reference.     Microbiology: No results found for this or any previous visit (from the past 240 hours).   Labs: BNP (last 3 results) No results for input(s): BNP in the last 8760 hours. Basic Metabolic Panel: Recent Labs  Lab 05/18/23 0319 05/18/23 1239 05/19/23 0340 05/20/23 0307 05/21/23 0256 05/22/23 0500  NA 132* 138 130* 134* 131* 131*  K 4.1 4.6 4.5 4.7 4.8 5.1  CL 108 114* 109 112* 110 112*  CO2 15*  --  13* 12* 14* 13*  GLUCOSE 130* 109* 170* 74 121* 81  BUN 49* 43* 56* 59* 58* 58*  CREATININE 4.46* 5.70* 5.36* 5.89* 5.98* 5.06*  CALCIUM  8.0*  --  8.0* 8.1* 7.7* 7.5*  MG  --   --  1.9 1.9  --   --    Liver Function Tests: Recent Labs  Lab 05/18/23 0319 05/19/23 0340 05/20/23 0307 05/21/23 0256 05/22/23 0500  AST 147* 117* 57* 42* 41  ALT 151* 150* 110* 96* 83*  ALKPHOS 1,103* 1,195* 965* 994* 890*  BILITOT 14.5* 13.7* 6.9* 5.6* 5.7*  PROT 5.5* 5.7* 5.5* 5.9* 5.8*  ALBUMIN  1.9* 2.0* 2.0* 1.9* 2.1*   No results for input(s): LIPASE, AMYLASE in the last 168 hours. No results for input(s): AMMONIA in the last 168 hours. CBC: Recent Labs  Lab 05/17/23 0256 05/18/23 0319 05/18/23 1239 05/18/23 1732 05/19/23 0340 05/20/23 0307 05/22/23 0306  WBC 6.5 5.9  --   --  7.2 7.1 7.6  NEUTROABS  --   --   --   --  5.2 4.8 5.4  HGB 7.1* 6.7* 9.2* 7.5* 7.8* 7.3* 7.1*  HCT 21.4* 20.6* 27.0* 22.4* 24.4* 23.8* 22.7*  MCV 94.3 94.9  --   --  96.1 98.3 100.0  PLT 189 187  --   --  176 171 175   Cardiac Enzymes: No  results for input(s): CKTOTAL, CKMB, CKMBINDEX, TROPONINI in the last 168 hours. BNP: Invalid input(s): POCBNP CBG: Recent Labs  Lab 05/21/23 2052 05/22/23 0740 05/22/23 0803 05/22/23 0915 05/22/23 1128  GLUCAP 141* 59* 49* 128* 111*   D-Dimer No results for input(s): DDIMER in the last 72 hours. Hgb A1c No results for input(s): HGBA1C in the last 72 hours. Lipid Profile No results for input(s): CHOL, HDL, LDLCALC, TRIG, CHOLHDL, LDLDIRECT in the last 72 hours. Thyroid function studies No results for input(s): TSH, T4TOTAL, T3FREE, THYROIDAB in the last 72 hours.  Invalid input(s): FREET3 Anemia work up Recent Labs    05/21/23 0256  FERRITIN 321  TIBC 251  IRON 81   Urinalysis    Component Value Date/Time   COLORURINE AMBER (A) 05/20/2023 1728   APPEARANCEUR CLEAR 05/20/2023 1728   LABSPEC 1.011 05/20/2023 1728   PHURINE 5.0 05/20/2023 1728   GLUCOSEU 150 (A) 05/20/2023 1728   HGBUR SMALL (A) 05/20/2023 1728   BILIRUBINUR NEGATIVE 05/20/2023 1728   KETONESUR NEGATIVE 05/20/2023 1728   PROTEINUR >=300 (A) 05/20/2023 1728   NITRITE NEGATIVE 05/20/2023 1728   LEUKOCYTESUR NEGATIVE 05/20/2023 1728   Sepsis Labs Recent Labs  Lab 05/18/23 0319 05/19/23 0340 05/20/23 0307 05/22/23 0306  WBC 5.9 7.2 7.1 7.6   Microbiology No results found for this or any previous visit (from the past 240 hours).   Time coordinating discharge: 35 minutes  SIGNED:   Renato Applebaum, MD  [facility] 05/22/2023, 12:48 PM

## 2023-05-22 NOTE — Progress Notes (Signed)
 Patient ID: Jeffrey Young, male   DOB: [birth year], 57 y.o.   MRN: [id number] S: Feels well, no complaints.  Receiving blood transfusion. O:BP (!) 148/88   Pulse 64   Temp (!) 97.4 F (36.3 C) (Axillary)   Resp 14   Ht 6' 1 (1.854 m)   Wt 87 kg   SpO2 98%   BMI 25.30 kg/m   Intake/Output Summary (Last 24 hours) at 05/22/2023 1229 Last data filed at 05/22/2023 1140 Gross per 24 hour  Intake 390 ml  Output 600 ml  Net -210 ml   Intake/Output: I/O last 3 completed shifts: In: 540 [P.O.:540] Out: 2000 [Urine:2000]  Intake/Output this shift:  Total I/O In: 270 [P.O.:240; Blood:30] Out: -  Weight change:  Gen: NAd CVS: RRR Resp: CTA Abd: +BS ,soft, NT/ND Ext: no edema  Recent Labs  Lab 05/16/23 0316 05/17/23 0256 05/18/23 0319 05/18/23 1239 05/19/23 0340 05/20/23 0307 05/21/23 0256 05/22/23 0500  NA 128* 128* 132* 138 130* 134* 131* 131*  K 3.9 4.0 4.1 4.6 4.5 4.7 4.8 5.1  CL 106 107 108 114* 109 112* 110 112*  CO2 15* 14* 15*  --  13* 12* 14* 13*  GLUCOSE 133* 94 130* 109* 170* 74 121* 81  BUN 45* 50* 49* 43* 56* 59* 58* 58*  CREATININE 4.37* 4.37* 4.46* 5.70* 5.36* 5.89* 5.98* 5.06*  ALBUMIN  1.9* 2.1* 1.9*  --  2.0* 2.0* 1.9* 2.1*  CALCIUM  7.7* 7.9* 8.0*  --  8.0* 8.1* 7.7* 7.5*  AST 167* 151* 147*  --  117* 57* 42* 41  ALT 154* 149* 151*  --  150* 110* 96* 83*   Liver Function Tests: Recent Labs  Lab 05/20/23 0307 05/21/23 0256 05/22/23 0500  AST 57* 42* 41  ALT 110* 96* 83*  ALKPHOS 965* 994* 890*  BILITOT 6.9* 5.6* 5.7*  PROT 5.5* 5.9* 5.8*  ALBUMIN  2.0* 1.9* 2.1*   No results for input(s): LIPASE, AMYLASE in the last 168 hours. No results for input(s): AMMONIA in the last 168 hours. CBC: Recent Labs  Lab 05/17/23 0256 05/18/23 0319 05/18/23 1239 05/19/23 0340 05/20/23 0307 05/22/23 0306  WBC 6.5 5.9  --  7.2 7.1 7.6  NEUTROABS  --   --   --  5.2 4.8 5.4  HGB 7.1* 6.7*   < > 7.8* 7.3* 7.1*  HCT 21.4* 20.6*   < > 24.4* 23.8* 22.7*   MCV 94.3 94.9  --  96.1 98.3 100.0  PLT 189 187  --  176 171 175   < > = values in this interval not displayed.   Cardiac Enzymes: No results for input(s): CKTOTAL, CKMB, CKMBINDEX, TROPONINI in the last 168 hours. CBG: Recent Labs  Lab 05/21/23 2052 05/22/23 0740 05/22/23 0803 05/22/23 0915 05/22/23 1128  GLUCAP 141* 59* 49* 128* 111*    Iron Studies:  Recent Labs    05/21/23 0256  IRON 81  TIBC 251  FERRITIN 321   Studies/Results: No results found.  sodium chloride    Intravenous Once   amLODipine   5 mg Oral Daily   carvedilol   12.5 mg Oral BID WC   Chlorhexidine  Gluconate Cloth  6 each Topical Daily   insulin  aspart  0-6 Units Subcutaneous TID WC   insulin  glargine-yfgn  6 Units Subcutaneous BID   multivitamin with minerals  1 tablet Oral Daily   pantoprazole   40 mg Oral Daily   sodium bicarbonate   650 mg Oral TID   sodium chloride  flush  10-40 mL  Intracatheter Q12H    BMET    Component Value Date/Time   NA 131 (L) 05/22/2023 0500   K 5.1 05/22/2023 0500   CL 112 (H) 05/22/2023 0500   CO2 13 (L) 05/22/2023 0500   GLUCOSE 81 05/22/2023 0500   BUN 58 (H) 05/22/2023 0500   CREATININE 5.06 (H) 05/22/2023 0500   CREATININE 3.53 (H) 11/06/2022 0814   CALCIUM  7.5 (L) 05/22/2023 0500   GFRNONAA 12 (L) 05/22/2023 0500   GFRNONAA 19 (L) 11/06/2022 0814   GFRAA >60 01/26/2018 1339   CBC    Component Value Date/Time   WBC 7.6 05/22/2023 0306   RBC 2.27 (L) 05/22/2023 0306   HGB 7.1 (L) 05/22/2023 0306   HGB 9.6 (L) 11/06/2022 0814   HCT 22.7 (L) 05/22/2023 0306   PLT 175 05/22/2023 0306   PLT 215 11/06/2022 0814   MCV 100.0 05/22/2023 0306   MCH 31.3 05/22/2023 0306   MCHC 31.3 05/22/2023 0306   RDW 20.2 (H) 05/22/2023 0306   LYMPHSABS 0.9 05/22/2023 0306   MONOABS 0.9 05/22/2023 0306   EOSABS 0.3 05/22/2023 0306   BASOSABS 0.1 05/22/2023 0306    Assessment/Plan:   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  Good UOP and no uremic symptoms.  Scr has finally started to improve.  Ok for discharge today and then will arrange for outpatient follow up in 2-3 weeks.  He is to follow up next week with Dr. Cloretta and will have labs drawn at that time.     Avoid nephrotoxic medications including NSAIDs and iodinated intravenous contrast exposure unless the latter is absolutely indicated.   Preferred narcotic agents for pain control are hydromorphone , fentanyl , and methadone. Morphine  should not be used.  Avoid Baclofen  and avoid oral sodium phosphate  and magnesium  citrate based laxatives / bowel preps.  Continue strict Input and Output monitoring. Will monitor the patient closely with you and intervene or adjust therapy as indicated by changes in clinical status/labs   CKD stage IV - has had progressive CKD since 2019.  Unclear etiology but does have h/o DM, HTN, and has had several cycles of chemo for his colon cancer.  He will need to follow up with our office after discharge. 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  Metastatic colon cancer - followed by Dr. Cloretta. Anemia of malignancy - will defer to Dr. Cloretta.  Receiving blood transfusion. Metabolic acidosis - will start sodium bicarb and follow.  HTN - stable, avoid ACE/ARB for now. DM type 2 - per primary Disposition - likely discharge to home after transfusion.  Fairy RONAL Sellar, MD [facility]

## 2023-05-23 LAB — BPAM RBC
Blood Product Expiration Date: 202501292359
ISSUE DATE / TIME: 202412311111
Unit Type and Rh: 6200

## 2023-05-23 LAB — TYPE AND SCREEN
ABO/RH(D): A POS
Antibody Screen: NEGATIVE
Unit division: 0

## 2023-05-28 ENCOUNTER — Inpatient Hospital Stay: Payer: Commercial Managed Care - PPO | Attending: Oncology

## 2023-05-28 ENCOUNTER — Inpatient Hospital Stay: Payer: Commercial Managed Care - PPO

## 2023-05-28 ENCOUNTER — Inpatient Hospital Stay (HOSPITAL_BASED_OUTPATIENT_CLINIC_OR_DEPARTMENT_OTHER): Payer: Commercial Managed Care - PPO | Admitting: Nurse Practitioner

## 2023-05-28 ENCOUNTER — Encounter: Payer: Self-pay | Admitting: Nurse Practitioner

## 2023-05-28 VITALS — BP 138/79 | HR 64 | Temp 98.2°F | Resp 18 | Ht 73.0 in | Wt 192.5 lb

## 2023-05-28 DIAGNOSIS — Z452 Encounter for adjustment and management of vascular access device: Secondary | ICD-10-CM | POA: Insufficient documentation

## 2023-05-28 DIAGNOSIS — L98499 Non-pressure chronic ulcer of skin of other sites with unspecified severity: Secondary | ICD-10-CM | POA: Insufficient documentation

## 2023-05-28 DIAGNOSIS — C189 Malignant neoplasm of colon, unspecified: Secondary | ICD-10-CM | POA: Diagnosis not present

## 2023-05-28 LAB — CBC WITH DIFFERENTIAL (CANCER CENTER ONLY)
Abs Immature Granulocytes: 0.05 10*3/uL (ref 0.00–0.07)
Basophils Absolute: 0.1 10*3/uL (ref 0.0–0.1)
Basophils Relative: 1 %
Eosinophils Absolute: 0.5 10*3/uL (ref 0.0–0.5)
Eosinophils Relative: 7 %
HCT: 25.3 % — ABNORMAL LOW (ref 39.0–52.0)
Hemoglobin: 8.2 g/dL — ABNORMAL LOW (ref 13.0–17.0)
Immature Granulocytes: 1 %
Lymphocytes Relative: 13 %
Lymphs Abs: 0.9 10*3/uL (ref 0.7–4.0)
MCH: 31.2 pg (ref 26.0–34.0)
MCHC: 32.4 g/dL (ref 30.0–36.0)
MCV: 96.2 fL (ref 80.0–100.0)
Monocytes Absolute: 0.7 10*3/uL (ref 0.1–1.0)
Monocytes Relative: 10 %
Neutro Abs: 4.5 10*3/uL (ref 1.7–7.7)
Neutrophils Relative %: 68 %
Platelet Count: 275 10*3/uL (ref 150–400)
RBC: 2.63 MIL/uL — ABNORMAL LOW (ref 4.22–5.81)
RDW: 17.4 % — ABNORMAL HIGH (ref 11.5–15.5)
WBC Count: 6.7 10*3/uL (ref 4.0–10.5)
nRBC: 0 % (ref 0.0–0.2)

## 2023-05-28 LAB — CMP (CANCER CENTER ONLY)
ALT: 47 U/L — ABNORMAL HIGH (ref 0–44)
AST: 28 U/L (ref 15–41)
Albumin: 2.9 g/dL — ABNORMAL LOW (ref 3.5–5.0)
Alkaline Phosphatase: 699 U/L — ABNORMAL HIGH (ref 38–126)
Anion gap: 7 (ref 5–15)
BUN: 53 mg/dL — ABNORMAL HIGH (ref 6–20)
CO2: 19 mmol/L — ABNORMAL LOW (ref 22–32)
Calcium: 8.1 mg/dL — ABNORMAL LOW (ref 8.9–10.3)
Chloride: 110 mmol/L (ref 98–111)
Creatinine: 5.42 mg/dL — ABNORMAL HIGH (ref 0.61–1.24)
GFR, Estimated: 11 mL/min — ABNORMAL LOW (ref >60)
Glucose, Bld: 196 mg/dL — ABNORMAL HIGH (ref 70–99)
Potassium: 5.2 mmol/L — ABNORMAL HIGH (ref 3.5–5.1)
Sodium: 136 mmol/L (ref 135–145)
Total Bilirubin: 4 mg/dL (ref 0.0–1.2)
Total Protein: 6.7 g/dL (ref 6.5–8.1)

## 2023-05-28 LAB — SAMPLE TO BLOOD BANK

## 2023-05-28 MED ORDER — SODIUM CHLORIDE 0.9% FLUSH
10.0000 mL | INTRAVENOUS | Status: DC | PRN
Start: 2023-05-28 — End: 2023-05-28
  Administered 2023-05-28: 10 mL

## 2023-05-28 MED ORDER — HEPARIN SOD (PORK) LOCK FLUSH 100 UNIT/ML IV SOLN
500.0000 [IU] | Freq: Once | INTRAVENOUS | Status: AC | PRN
  Administered 2023-05-28: 500 [IU]

## 2023-05-28 NOTE — Progress Notes (Signed)
 CRITICAL VALUE STICKER  CRITICAL VALUE: total bili=4.0  RECEIVER (on-site recipient of call):Cutberto Winfree,RN  DATE & TIME NOTIFIED: 05/28/23 @ 1229  MESSENGER (representative from lab):Alan  MD NOTIFIED: Olam Ned, NP  TIME OF NOTIFICATION: 1230  RESPONSE:  Being seen (result is improved).

## 2023-05-28 NOTE — Progress Notes (Signed)
 [facility] OFFICE PROGRESS NOTE   Diagnosis: Colon cancer  INTERVAL HISTORY:   [name] returns for first follow-up since hospital discharge.  Overall good appetite.  Bowels are moving.  Intake stool consistency.  No nausea or vomiting.  He denies pain.  He continues to have pruritus mainly involving arms and back.  He has dyspnea on exertion, periodic wheezing.  Mild nonproductive cough.  No fever.  Objective:  Vital signs in last 24 hours:  Blood pressure 138/79, pulse 64, temperature 98.2 F (36.8 C), temperature source Temporal, resp. rate 18, height 6' 1 (1.854 m), weight 192 lb 8 oz (87.3 kg), SpO2 97%.    HEENT: Scleral icterus. Resp: Lungs clear bilaterally. Cardio: Regular rate and rhythm. GI: Abdomen soft and nontender. Vascular: No leg edema. Skin: Plantar aspect left upper foot with what appears to be a superficial healing ulceration. Port-A-Cath without erythema.  Lab Results:  Lab Results  Component Value Date   WBC 6.7 05/28/2023   HGB 8.2 (L) 05/28/2023   HCT 25.3 (L) 05/28/2023   MCV 96.2 05/28/2023   PLT 275 05/28/2023   NEUTROABS 4.5 05/28/2023    Imaging:  No results found.  Medications: I have reviewed the patient's current medications.  Assessment/Plan: Descending colon cancer, stage IIIc (T3N2b M0), status post a partial left colectomy 07/30/2020, 9/16 lymph nodes positive, lymphovascular invasion, 1 satellite nodule, negative margins, MSS, no loss of mismatch repair protein expression; foundation 1 K-ras wild-type, NRAS Q61H, microsatellite stable, tumor mutation burden 4. -History of large polyp in the left side of the colon-referred to [facility] in 05/2018 for procedure canceled secondary to COVID-19 pandemic.  Procedure was not rescheduled. -CT chest/abdomen/pelvis with contrast 07/27/2020-3 small pulmonary nodules less than 5 mm favored to be benign, circumferential luminal narrowing of the distal transverse colon concerning for  malignancy, no metastatic adenopathy in the mesentery porta hepatis, no for metastasis. -CEA on 07/27/2020 was 17.3; 37 on 08/30/2020; 33 on 09/13/2020 -Colonoscopy performed 07/27/2020 showed a fungating, infiltrative and ulcerated nonobstructing large mass in the proximal descending colon.  Biopsy-adenocarcinoma -Cycle 1 FOLFOX 08/30/2020 -Cycle 2 FOLFOX 09/13/2020, Emend  added for delayed nausea -Cycle 3 FOLFOX 09/27/2020 -Cycle 4 FOLFOX 10/11/2020 -Cycle 5 FOLFOX 11/08/2020 -Cycle 6 FOLFOX 11/23/2020 -CT 12/03/2020-prior 3 mm left apical nodule no longer seen, no new/suspicious pulmonary nodules, no evidence of metastatic disease -Cycle 7 FOLFOX 12/06/2020 -Cycle 8 FOLFOX 12/21/2020 -Cycle 9 FOLFOX 01/03/2021 -Cycle 10 FOLFOX 01/17/2021 -Cycle 11 FOLFOX 01/31/2021, oxaliplatin  held secondary to neuropathy symptoms -Cycle 12 FOLFOX 02/15/2021, oxaliplatin  held secondary to neuropathy -CT abdomen/pelvis 04/02/2021-no evidence of recurrent colon cancer -CT chest 04/08/2021-no evidence of metastatic disease -Elevated CEA February and March 2023 -08/17/2021 PET scan-no evidence of local recurrence or metastasis -09/26/2021-Guardant-ctDNA detected -Colonoscopy 10/04/2021-negative -CT 11/17/2021-no evidence of metastatic disease -CTs 01/08/2022-no evidence of metastatic disease -PET scan 08/10/2022-several newly enlarged lymph nodes with moderate metabolic activity -CT 11/06/2022-stable left supraclavicular and retroperitoneal nodes, mild progression of pelvic adenopathy, new 6 mm segment 2 liver lesion -CT 03/05/2023-increase size of left supraclavicular node and retroperitoneal nodes, increased size of gastropathic and left iliac side chain lymph nodes, stable subtle hypoattenuating lesions at segment 2 in the hepatic dome -CT abdomen/pelvis 05/07/2023-new intrahepatic and extrahepatic biliary duct dilatation with possible associated periportal edema.  Common duct dilated up to 15 mm diameter, abruptly terminates prior  to entering the head of the pancreas.  Distended gallbladder with possible gallbladder wall thickening and pericholecystic edema.  No substantial change in lymphadenopathy of  the gastrohepatic ligament, hepatoduodenal ligament and left external iliac chain. 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  Moderate diffuse intrahepatic and proximal extrahepatic biliary duct dilatation.  Mild gallbladder distention with moderate diffuse gallbladder wall thickening.  Minimal pericholecystic fluid and fat stranding.  Solitary 1.1 cm anterior segment 2 left liver mass.  Multiple mildly enlarged porta hepatis and portacaval nodes. -Biopsy perihepatic lymph node 05/10/2023-malignant cells, adenocarcinoma 2.  Anemia due to GI bleeding, iron deficiency?,  Renal insufficiency? 3.  New onset acute diastolic CHF March 2022 4.  Diabetes mellitus 5.  Renal insufficiency 6.  Hypertension 7.  History of left transmetatarsal amputation 8.  History of colon polyps 9.  Neuropathy 10.  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  Admission with obstructive jaundice 05/07/2023 - 05/22/2023.  Upper EUS 05/10/2023-lymph node visualized and measured in the porta hepatis region; FNA showed malignant cells, adenocarcinoma.  ERCP 05/10/2023-biliary tract obstruction secondary to a mass in the common bile duct, plastic stent placed into the common bile duct.  ERCP 05/18/2023-visibly patent stent from the common bile duct was seen in the major papilla.  Stent removed from the common bile duct.  2 plastic stents placed into the common bile duct.    Disposition: [name] appears stable.  He was recently hospitalized with obstructive jaundice due to lymphadenopathy.  Biopsy confirmed adenocarcinoma.  He underwent a  second ERCP 05/18/2023 with 2 stents placed into the common bile duct.  We reviewed the chemistry panel from today.  The bilirubin is slowly improving.  He has persistent renal failure.  He was followed by nephrology during the hospitalization.  His wife plans to contact nephrology for an appointment as an outpatient.  We reviewed the CBC.  He has persistent anemia likely due to renal failure.  Monitor for now.  We discussed systemic therapy options.  He understands the recommendation for FOLFIRI chemotherapy.  We reviewed potential side effects.  He understands the bilirubin needs to be less than 2 to receive irinotecan .  We discussed bevacizumab.  He has an ulcer on the left foot.  He and his wife understand the ulcer will need to be healed prior to considering bevacizumab.  He will return for lab and follow-up in approximately 2 weeks.  Patient seen with Dr. Cloretta.      Olam Ned ANP/GNP-BC   05/28/2023  12:22 PM  This was a shared visit with Olam Ned.  Mr. Valverde was interviewed and examined.  He was admitted last month with obstructive jaundice secondary to metastatic colon cancer involving portal lymphadenopathy.  He underwent placement of biliary stents.  Hyperbilirubinemia has partially improved.  He developed progressive renal failure during the recent hospital admission and has persistent marked elevation of the creatinine.  Mr. Desroches has metastatic colon cancer.  He appears asymptomatic aside from the biliary obstruction.  Treatment options are limited by the persistent hyperbilirubinemia and renal failure.  He was previously treated with oxaliplatin  and has neuropathy symptoms.  He will not be a candidate for bevacizumab unless the foot ulcer heals.  I recommend treatment with FOLFIRI when the bilirubin has further improved.  We reviewed potential toxicities associated with the FOLFIRI regimen including the chance of hematologic toxicity, infection, bleeding, alopecia,  nausea/vomiting, and diarrhea.  I was present for greater than 50% of today's visit.  I performed medical decision making.  Arvella Hof, MD

## 2023-06-08 ENCOUNTER — Other Ambulatory Visit: Payer: Commercial Managed Care - PPO

## 2023-06-08 ENCOUNTER — Ambulatory Visit: Payer: Commercial Managed Care - PPO | Admitting: Oncology

## 2023-06-08 ENCOUNTER — Other Ambulatory Visit: Payer: Self-pay

## 2023-06-12 ENCOUNTER — Inpatient Hospital Stay: Payer: Commercial Managed Care - PPO

## 2023-06-12 ENCOUNTER — Inpatient Hospital Stay (HOSPITAL_BASED_OUTPATIENT_CLINIC_OR_DEPARTMENT_OTHER): Payer: Commercial Managed Care - PPO | Admitting: Nurse Practitioner

## 2023-06-12 ENCOUNTER — Encounter: Payer: Self-pay | Admitting: Nurse Practitioner

## 2023-06-12 VITALS — BP 122/73 | HR 69 | Temp 98.1°F | Resp 20 | Ht 73.0 in | Wt 192.0 lb

## 2023-06-12 DIAGNOSIS — C189 Malignant neoplasm of colon, unspecified: Secondary | ICD-10-CM

## 2023-06-12 DIAGNOSIS — Z452 Encounter for adjustment and management of vascular access device: Secondary | ICD-10-CM | POA: Diagnosis not present

## 2023-06-12 LAB — CMP (CANCER CENTER ONLY)
ALT: 13 U/L (ref 0–44)
AST: 13 U/L — ABNORMAL LOW (ref 15–41)
Albumin: 3.1 g/dL — ABNORMAL LOW (ref 3.5–5.0)
Alkaline Phosphatase: 340 U/L — ABNORMAL HIGH (ref 38–126)
Anion gap: 9 (ref 5–15)
BUN: 40 mg/dL — ABNORMAL HIGH (ref 6–20)
CO2: 16 mmol/L — ABNORMAL LOW (ref 22–32)
Calcium: 7.7 mg/dL — ABNORMAL LOW (ref 8.9–10.3)
Chloride: 109 mmol/L (ref 98–111)
Creatinine: 4.48 mg/dL — ABNORMAL HIGH (ref 0.61–1.24)
GFR, Estimated: 14 mL/min — ABNORMAL LOW (ref >60)
Glucose, Bld: 144 mg/dL — ABNORMAL HIGH (ref 70–99)
Potassium: 4.4 mmol/L (ref 3.5–5.1)
Sodium: 134 mmol/L — ABNORMAL LOW (ref 135–145)
Total Bilirubin: 2 mg/dL — ABNORMAL HIGH (ref 0.0–1.2)
Total Protein: 6.5 g/dL (ref 6.5–8.1)

## 2023-06-12 LAB — CBC WITH DIFFERENTIAL (CANCER CENTER ONLY)
Abs Immature Granulocytes: 0.06 10*3/uL (ref 0.00–0.07)
Basophils Absolute: 0.1 10*3/uL (ref 0.0–0.1)
Basophils Relative: 1 %
Eosinophils Absolute: 0.2 10*3/uL (ref 0.0–0.5)
Eosinophils Relative: 2 %
HCT: 26.5 % — ABNORMAL LOW (ref 39.0–52.0)
Hemoglobin: 9.2 g/dL — ABNORMAL LOW (ref 13.0–17.0)
Immature Granulocytes: 1 %
Lymphocytes Relative: 17 %
Lymphs Abs: 1.6 10*3/uL (ref 0.7–4.0)
MCH: 31.5 pg (ref 26.0–34.0)
MCHC: 34.7 g/dL (ref 30.0–36.0)
MCV: 90.8 fL (ref 80.0–100.0)
Monocytes Absolute: 0.7 10*3/uL (ref 0.1–1.0)
Monocytes Relative: 8 %
Neutro Abs: 6.5 10*3/uL (ref 1.7–7.7)
Neutrophils Relative %: 71 %
Platelet Count: 297 10*3/uL (ref 150–400)
RBC: 2.92 MIL/uL — ABNORMAL LOW (ref 4.22–5.81)
RDW: 15.9 % — ABNORMAL HIGH (ref 11.5–15.5)
WBC Count: 9.1 10*3/uL (ref 4.0–10.5)
nRBC: 0 % (ref 0.0–0.2)

## 2023-06-12 LAB — CEA (ACCESS): CEA (CHCC): 12.16 ng/mL — ABNORMAL HIGH (ref 0.00–5.00)

## 2023-06-12 MED ORDER — HEPARIN SOD (PORK) LOCK FLUSH 100 UNIT/ML IV SOLN
500.0000 [IU] | Freq: Once | INTRAVENOUS | Status: AC | PRN
  Administered 2023-06-12: 500 [IU]

## 2023-06-12 MED ORDER — SODIUM CHLORIDE 0.9% FLUSH
10.0000 mL | INTRAVENOUS | Status: DC | PRN
  Administered 2023-06-12: 10 mL

## 2023-06-12 NOTE — Progress Notes (Unsigned)
[facility] OFFICE PROGRESS NOTE   Diagnosis: Colon cancer  INTERVAL HISTORY:   Jeffrey Young returns as scheduled.  He reports he has "good days and bad days".  He denies nausea/vomiting.  Bowels are moving.  He denies bleeding.  No fever.  Left foot ulcer continues to heal.  He is scheduled to see nephrology next week.  Objective:  Vital signs in last 24 hours:  Blood pressure 122/73, pulse 69, temperature 98.1 F (36.7 C), temperature source Temporal, resp. rate 20, height 6\' 1"  (1.854 m), weight 192 lb (87.1 kg), SpO2 100%.    HEENT: White coating over tongue.  No buccal thrush.  Sclera anicteric. Resp: Lungs clear bilaterally. Cardio: Regular rate and rhythm. GI: No hepatosplenomegaly. Vascular: No leg edema. Skin: Left foot is bandaged.  He requested we not remove the bandage today. Port-A-Cath without erythema.  Lab Results:  Lab Results  Component Value Date   WBC 9.1 06/12/2023   HGB 9.2 (L) 06/12/2023   HCT 26.5 (L) 06/12/2023   MCV 90.8 06/12/2023   PLT 297 06/12/2023   NEUTROABS 6.5 06/12/2023    Imaging:  No results found.  Medications: I have reviewed the patient's current medications.  Assessment/Plan: Descending colon cancer, stage IIIc (T3N2b M0), status post a partial left colectomy 07/30/2020, 9/16 lymph nodes positive, lymphovascular invasion, 1 satellite nodule, negative margins, MSS, no loss of mismatch repair protein expression; foundation 1 K-ras wild-type, NRAS Q61H, microsatellite stable, tumor mutation burden 4. -History of large polyp in the left side of the colon-referred to [facility] in 05/2018 for procedure canceled secondary to COVID-19 pandemic.  Procedure was not rescheduled. -CT chest/abdomen/pelvis with contrast 07/27/2020-3 small pulmonary nodules less than 5 mm favored to be benign, circumferential luminal narrowing of the distal transverse colon concerning for malignancy, no metastatic adenopathy in the mesentery porta hepatis,  no for metastasis. -CEA on 07/27/2020 was 17.3; 37 on 08/30/2020; 33 on 09/13/2020 -Colonoscopy performed 07/27/2020 showed a fungating, infiltrative and ulcerated nonobstructing large mass in the proximal descending colon.  Biopsy-adenocarcinoma -Cycle 1 FOLFOX 08/30/2020 -Cycle 2 FOLFOX 09/13/2020, Emend added for delayed nausea -Cycle 3 FOLFOX 09/27/2020 -Cycle 4 FOLFOX 10/11/2020 -Cycle 5 FOLFOX 11/08/2020 -Cycle 6 FOLFOX 11/23/2020 -CT 12/03/2020-prior 3 mm left apical nodule no longer seen, no new/suspicious pulmonary nodules, no evidence of metastatic disease -Cycle 7 FOLFOX 12/06/2020 -Cycle 8 FOLFOX 12/21/2020 -Cycle 9 FOLFOX 01/03/2021 -Cycle 10 FOLFOX 01/17/2021 -Cycle 11 FOLFOX 01/31/2021, oxaliplatin held secondary to neuropathy symptoms -Cycle 12 FOLFOX 02/15/2021, oxaliplatin held secondary to neuropathy -CT abdomen/pelvis 04/02/2021-no evidence of recurrent colon cancer -CT chest 04/08/2021-no evidence of metastatic disease -Elevated CEA February and March 2023 -08/17/2021 PET scan-no evidence of local recurrence or metastasis -09/26/2021-Guardant-ctDNA detected -Colonoscopy 10/04/2021-negative -CT 11/17/2021-no evidence of metastatic disease -CTs 01/08/2022-no evidence of metastatic disease -PET scan 08/10/2022-several newly enlarged lymph nodes with moderate metabolic activity -CT 11/06/2022-stable left supraclavicular and retroperitoneal nodes, mild progression of pelvic adenopathy, new 6 mm segment 2 liver lesion -CT 03/05/2023-increase size of left supraclavicular node and retroperitoneal nodes, increased size of gastropathic and left iliac side chain lymph nodes, stable subtle hypoattenuating lesions at segment 2 in the hepatic dome -CT abdomen/pelvis 05/07/2023-new intrahepatic and extrahepatic biliary duct dilatation with possible associated periportal edema.  Common duct dilated up to 15 mm diameter, abruptly terminates prior to entering the head of the pancreas.  Distended gallbladder with  possible gallbladder wall thickening and pericholecystic edema.  No substantial change in lymphadenopathy of the gastrohepatic ligament, hepatoduodenal ligament and  left external iliac chain. 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  Moderate diffuse intrahepatic and proximal extrahepatic biliary duct dilatation.  Mild gallbladder distention with moderate diffuse gallbladder wall thickening.  Minimal pericholecystic fluid and fat stranding.  Solitary 1.1 cm anterior segment 2 left liver mass.  Multiple mildly enlarged porta hepatis and portacaval nodes. -Biopsy perihepatic lymph node 05/10/2023-malignant cells, adenocarcinoma 2.  Anemia due to GI bleeding, iron deficiency?,  Renal insufficiency? 3.  New onset acute diastolic CHF March 2022 4.  Diabetes mellitus 5.  Renal insufficiency 6.  Hypertension 7.  History of left transmetatarsal amputation 8.  History of colon polyps 9.  Neuropathy 10.  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  Admission with obstructive jaundice 05/07/2023 - 05/22/2023.  Upper EUS 05/10/2023-lymph node visualized and measured in the porta hepatis region; FNA showed malignant cells, adenocarcinoma.  ERCP 05/10/2023-biliary tract obstruction secondary to a mass in the common bile duct, plastic stent placed into the common bile duct.  ERCP 05/18/2023-visibly patent stent from the common bile duct was seen in the major papilla.  Stent removed from the common bile duct.  2 plastic stents placed into the common bile duct.    Disposition: Jeffrey Young appears stable.  We reviewed the labs from today.  Bilirubin is better, now 2.  Creatinine remains elevated.  We again discussed chemotherapy on the FOLFIRI regimen.  We reviewed potential side effects.  He  understands there is an increased risk of side effects with the elevated bilirubin and renal failure.  He has an appointment with nephrology next week.  He will undergo restaging CTs in a few weeks.  He will return for follow-up and additional discussion in 3 weeks.  We are available to see him sooner if needed.  Patient seen with Dr. Truett Perna.  Lonna Cobb ANP/GNP-BC   06/12/2023  10:53 AM This was a shared visit with Lonna Cobb.  We discussed treatment options with Jeffrey Young and his wife.  The bilirubin continues to decline following bile duct stent placement.  He has persistent advanced renal insufficiency.  Treatment options are limited by the hyperbilirubinemia and renal failure.  He will undergo restaging CTs and return for an office visit in 2 weeks.  The plan is to begin FOLFIRI if the metastatic lymphadenopathy has progressed.  I was present for greater than 50% of today's visit.  I performed medical decision making.  Mancel Bale, MD

## 2023-06-13 ENCOUNTER — Encounter: Payer: Self-pay | Admitting: Oncology

## 2023-06-20 ENCOUNTER — Ambulatory Visit (HOSPITAL_COMMUNITY): Payer: Commercial Managed Care - PPO

## 2023-06-21 ENCOUNTER — Telehealth: Payer: Self-pay | Admitting: *Deleted

## 2023-06-21 NOTE — Telephone Encounter (Signed)
Patient was "no show" for his CT on 1/29. Wife reports "he forgot". Rescheduled for 07/02/23 at [facility] w/arrival at 2:45 to begin oral contrast and scan at 5 pm. Scheduler to move his 2/10 f/u to next Monday. Wife reports they can only come on Monday.

## 2023-06-27 ENCOUNTER — Encounter: Payer: Self-pay | Admitting: Oncology

## 2023-07-02 ENCOUNTER — Other Ambulatory Visit: Payer: Commercial Managed Care - PPO

## 2023-07-02 ENCOUNTER — Ambulatory Visit (HOSPITAL_BASED_OUTPATIENT_CLINIC_OR_DEPARTMENT_OTHER)
Admission: RE | Admit: 2023-07-02 | Discharge: 2023-07-02 | Disposition: A | Discharge status: Home / Self Care | Payer: Commercial Managed Care - PPO | Source: Ambulatory Visit | Attending: Nurse Practitioner | Admitting: Nurse Practitioner

## 2023-07-02 ENCOUNTER — Ambulatory Visit: Payer: Commercial Managed Care - PPO | Admitting: Oncology

## 2023-07-02 DIAGNOSIS — C189 Malignant neoplasm of colon, unspecified: Secondary | ICD-10-CM | POA: Diagnosis present

## 2023-07-09 ENCOUNTER — Inpatient Hospital Stay: Payer: Commercial Managed Care - PPO | Attending: Oncology

## 2023-07-09 ENCOUNTER — Encounter: Payer: Self-pay | Admitting: Nurse Practitioner

## 2023-07-09 ENCOUNTER — Inpatient Hospital Stay (HOSPITAL_BASED_OUTPATIENT_CLINIC_OR_DEPARTMENT_OTHER): Payer: Commercial Managed Care - PPO | Admitting: Nurse Practitioner

## 2023-07-09 ENCOUNTER — Inpatient Hospital Stay: Payer: Commercial Managed Care - PPO

## 2023-07-09 VITALS — BP 151/79 | HR 83 | Temp 98.1°F | Resp 18 | Ht 73.0 in | Wt 195.6 lb

## 2023-07-09 DIAGNOSIS — Z452 Encounter for adjustment and management of vascular access device: Secondary | ICD-10-CM | POA: Diagnosis present

## 2023-07-09 DIAGNOSIS — C189 Malignant neoplasm of colon, unspecified: Secondary | ICD-10-CM

## 2023-07-09 DIAGNOSIS — L98499 Non-pressure chronic ulcer of skin of other sites with unspecified severity: Secondary | ICD-10-CM | POA: Diagnosis not present

## 2023-07-09 LAB — CMP (CANCER CENTER ONLY)
ALT: 11 U/L (ref 0–44)
AST: 15 U/L (ref 15–41)
Albumin: 3.1 g/dL — ABNORMAL LOW (ref 3.5–5.0)
Alkaline Phosphatase: 178 U/L — ABNORMAL HIGH (ref 38–126)
Anion gap: 11 (ref 5–15)
BUN: 48 mg/dL — ABNORMAL HIGH (ref 6–20)
CO2: 19 mmol/L — ABNORMAL LOW (ref 22–32)
Calcium: 7.1 mg/dL — ABNORMAL LOW (ref 8.9–10.3)
Chloride: 106 mmol/L (ref 98–111)
Creatinine: 5.59 mg/dL — ABNORMAL HIGH (ref 0.61–1.24)
GFR, Estimated: 11 mL/min — ABNORMAL LOW (ref >60)
Glucose, Bld: 197 mg/dL — ABNORMAL HIGH (ref 70–99)
Potassium: 4.2 mmol/L (ref 3.5–5.1)
Sodium: 136 mmol/L (ref 135–145)
Total Bilirubin: 0.9 mg/dL (ref 0.0–1.2)
Total Protein: 7.4 g/dL (ref 6.5–8.1)

## 2023-07-09 LAB — CBC WITH DIFFERENTIAL (CANCER CENTER ONLY)
Abs Immature Granulocytes: 0.05 10*3/uL (ref 0.00–0.07)
Basophils Absolute: 0.1 10*3/uL (ref 0.0–0.1)
Basophils Relative: 1 %
Eosinophils Absolute: 0.3 10*3/uL (ref 0.0–0.5)
Eosinophils Relative: 4 %
HCT: 27.2 % — ABNORMAL LOW (ref 39.0–52.0)
Hemoglobin: 9.2 g/dL — ABNORMAL LOW (ref 13.0–17.0)
Immature Granulocytes: 1 %
Lymphocytes Relative: 15 %
Lymphs Abs: 1.4 10*3/uL (ref 0.7–4.0)
MCH: 31 pg (ref 26.0–34.0)
MCHC: 33.8 g/dL (ref 30.0–36.0)
MCV: 91.6 fL (ref 80.0–100.0)
Monocytes Absolute: 0.8 10*3/uL (ref 0.1–1.0)
Monocytes Relative: 9 %
Neutro Abs: 6.6 10*3/uL (ref 1.7–7.7)
Neutrophils Relative %: 70 %
Platelet Count: 242 10*3/uL (ref 150–400)
RBC: 2.97 MIL/uL — ABNORMAL LOW (ref 4.22–5.81)
RDW: 14.7 % (ref 11.5–15.5)
WBC Count: 9.2 10*3/uL (ref 4.0–10.5)
nRBC: 0 % (ref 0.0–0.2)

## 2023-07-09 LAB — CEA (ACCESS): CEA (CHCC): 39.27 ng/mL — ABNORMAL HIGH (ref 0.00–5.00)

## 2023-07-09 MED ORDER — HEPARIN SOD (PORK) LOCK FLUSH 100 UNIT/ML IV SOLN
500.0000 [IU] | Freq: Once | INTRAVENOUS | Status: AC | PRN
Start: 2023-07-09 — End: 2023-07-09
  Administered 2023-07-09: 500 [IU]

## 2023-07-09 MED ORDER — SODIUM CHLORIDE 0.9% FLUSH
10.0000 mL | INTRAVENOUS | Status: DC | PRN
  Administered 2023-07-09: 10 mL

## 2023-07-09 NOTE — Progress Notes (Signed)
[facility] OFFICE PROGRESS NOTE   Diagnosis: Colon cancer  INTERVAL HISTORY:   Jeffrey Young returns as scheduled.  Overall he feels well.  He has a good appetite and overall good energy level.  No nausea or vomiting.  He has periodic loose stools.  He reports the foot ulcer has not healed.  Objective:  Vital signs in last 24 hours:  Blood pressure (!) 151/79, pulse 83, temperature 98.1 F (36.7 C), temperature source Temporal, resp. rate 18, height 6\' 1"  (1.854 m), weight 195 lb 9.6 oz (88.7 kg), SpO2 100%.    HEENT: No thrush or ulcers. Lymphatics: No supraclavicular lymph nodes.  Question 1 to 2 cm medial left inguinal/femoral node. Resp: Lungs clear bilaterally. Cardio: Regular rate and rhythm. GI: Abdomen soft and nontender.  No hepatomegaly. Vascular: No leg edema. Port-A-Cath without erythema.  Lab Results:  Lab Results  Component Value Date   WBC 9.2 07/09/2023   HGB 9.2 (L) 07/09/2023   HCT 27.2 (L) 07/09/2023   MCV 91.6 07/09/2023   PLT 242 07/09/2023   NEUTROABS 6.6 07/09/2023    Imaging:  No results found.  Medications: I have reviewed the patient's current medications.  Assessment/Plan: Descending colon cancer, stage IIIc (T3N2b M0), status post a partial left colectomy 07/30/2020, 9/16 lymph nodes positive, lymphovascular invasion, 1 satellite nodule, negative margins, MSS, no loss of mismatch repair protein expression; foundation 1 K-ras wild-type, NRAS Q61H, microsatellite stable, tumor mutation burden 4. -History of large polyp in the left side of the colon-referred to [facility] in 05/2018 for procedure canceled secondary to COVID-19 pandemic.  Procedure was not rescheduled. -CT chest/abdomen/pelvis with contrast 07/27/2020-3 small pulmonary nodules less than 5 mm favored to be benign, circumferential luminal narrowing of the distal transverse colon concerning for malignancy, no metastatic adenopathy in the mesentery porta hepatis, no for  metastasis. -CEA on 07/27/2020 was 17.3; 37 on 08/30/2020; 33 on 09/13/2020 -Colonoscopy performed 07/27/2020 showed a fungating, infiltrative and ulcerated nonobstructing large mass in the proximal descending colon.  Biopsy-adenocarcinoma -Cycle 1 FOLFOX 08/30/2020 -Cycle 2 FOLFOX 09/13/2020, Emend added for delayed nausea -Cycle 3 FOLFOX 09/27/2020 -Cycle 4 FOLFOX 10/11/2020 -Cycle 5 FOLFOX 11/08/2020 -Cycle 6 FOLFOX 11/23/2020 -CT 12/03/2020-prior 3 mm left apical nodule no longer seen, no new/suspicious pulmonary nodules, no evidence of metastatic disease -Cycle 7 FOLFOX 12/06/2020 -Cycle 8 FOLFOX 12/21/2020 -Cycle 9 FOLFOX 01/03/2021 -Cycle 10 FOLFOX 01/17/2021 -Cycle 11 FOLFOX 01/31/2021, oxaliplatin held secondary to neuropathy symptoms -Cycle 12 FOLFOX 02/15/2021, oxaliplatin held secondary to neuropathy -CT abdomen/pelvis 04/02/2021-no evidence of recurrent colon cancer -CT chest 04/08/2021-no evidence of metastatic disease -Elevated CEA February and March 2023 -08/17/2021 PET scan-no evidence of local recurrence or metastasis -09/26/2021-Guardant-ctDNA detected -Colonoscopy 10/04/2021-negative -CT 11/17/2021-no evidence of metastatic disease -CTs 01/08/2022-no evidence of metastatic disease -PET scan 08/10/2022-several newly enlarged lymph nodes with moderate metabolic activity -CT 11/06/2022-stable left supraclavicular and retroperitoneal nodes, mild progression of pelvic adenopathy, new 6 mm segment 2 liver lesion -CT 03/05/2023-increase size of left supraclavicular node and retroperitoneal nodes, increased size of gastropathic and left iliac side chain lymph nodes, stable subtle hypoattenuating lesions at segment 2 in the hepatic dome -CT abdomen/pelvis 05/07/2023-new intrahepatic and extrahepatic biliary duct dilatation with possible associated periportal edema.  Common duct dilated up to 15 mm diameter, abruptly terminates prior to entering the head of the pancreas.  Distended gallbladder with possible  gallbladder wall thickening and pericholecystic edema.  No substantial change in lymphadenopathy of the gastrohepatic ligament, hepatoduodenal ligament and left external iliac  chain. 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  Moderate diffuse intrahepatic and proximal extrahepatic biliary duct dilatation.  Mild gallbladder distention with moderate diffuse gallbladder wall thickening.  Minimal pericholecystic fluid and fat stranding.  Solitary 1.1 cm anterior segment 2 left liver mass.  Multiple mildly enlarged porta hepatis and portacaval nodes. -Biopsy perihepatic lymph node 05/10/2023-malignant cells, adenocarcinoma -CTs 07/02/2023-persistent biliary duct dilatation with indwelling stent.  Persistent wall thickening of the gallbladder.  Subtle areas of nodal enlargement identified in the abdomen and pelvis, similar to recent prior examinations.  Enlarged node in the supraclavicular region appears smaller.  No new thoracic nodes.  Mild areas of groundglass in the upper lobes of the lungs bilaterally, nonspecific. 2.  Anemia due to GI bleeding, iron deficiency?,  Renal insufficiency? 3.  New onset acute diastolic CHF March 2022 4.  Diabetes mellitus 5.  Renal insufficiency 6.  Hypertension 7.  History of left transmetatarsal amputation 8.  History of colon polyps 9.  Neuropathy 10.  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  Admission with obstructive jaundice 05/07/2023 - 05/22/2023.  Upper EUS 05/10/2023-lymph node visualized and measured in the porta hepatis region; FNA showed malignant cells, adenocarcinoma.  ERCP 05/10/2023-biliary tract obstruction secondary to a mass in the common bile duct, plastic stent placed into the common bile duct.  ERCP 05/18/2023-visibly patent stent  from the common bile duct was seen in the major papilla.  Stent removed from the common bile duct.  2 plastic stents placed into the common bile duct.    Disposition: Jeffrey Young has metastatic colon cancer.  We reviewed the recent CT report/images with him and his wife at today's visit.  They understand the areas of lymph node enlargement are stable to slightly increased.  They further understand no therapy will be curative.  He appears asymptomatic at present.  We had preliminary discussion regarding treatment with FOLFIRI.  He has an appointment with nephrology next week.  He will return for follow-up here in approximately 4 weeks for more discussion.  We are available to see him sooner if needed.  Patient seen with Dr. Truett Perna.    Lonna Cobb ANP/GNP-BC   07/09/2023  3:07 PM   This was a shared visit with Lonna Cobb.  Jeffrey Young was interviewed and examined.  We reviewed the restaging CT findings and images with Jeffrey Young and his wife.  He has metastatic colon cancer.  He appears asymptomatic from the colon cancer.  He presented with biliary obstruction in December.  This has been palliated with placement of a common bile duct stent.  There is minimal evidence of disease progression on the restaging CTs.  He has multiple enlarged abdominal/pelvic lymph nodes and no other clear evidence of metastatic disease.  He is scheduled to see nephrology next week.  We discussed observation versus FOLFIRI chemotherapy.  He has an increased risk of developing toxicity from chemotherapy with the advanced renal failure.  The plan is to continue observation.  We will initiate FOLFIRI chemotherapy if he develops symptomatic disease or significant CT evidence of disease progression.  I was present for greater than 50% of today's visit.  I performed medical decision making.  Mancel Bale, MD

## 2023-07-13 ENCOUNTER — Telehealth: Payer: Self-pay

## 2023-07-13 NOTE — Telephone Encounter (Signed)
Received request for medical records from [facility] for Patient's Disability Claim Number [id number]. Forwarded request to [facility] Health Information Management Office. Notified Patient that signed Release of Information Form was not the updated form and that the request may not be completed without the most current Release of Information Form. Emailed Patient a new release form with instructions. No other needs or concerns noted at this time.

## 2023-08-09 ENCOUNTER — Inpatient Hospital Stay: Payer: Medicare Other

## 2023-08-09 ENCOUNTER — Inpatient Hospital Stay: Payer: Medicare Other | Admitting: Oncology

## 2023-08-09 ENCOUNTER — Inpatient Hospital Stay: Payer: Medicare Other | Attending: Oncology

## 2023-08-14 ENCOUNTER — Other Ambulatory Visit: Payer: Self-pay | Admitting: Gastroenterology

## 2023-08-24 ENCOUNTER — Encounter (HOSPITAL_COMMUNITY): Payer: Self-pay | Admitting: Gastroenterology

## 2023-08-24 NOTE — Progress Notes (Signed)
 Attempted to obtain medical history for pre op call via telephone, unable to reach at this time. HIPAA compliant voicemail message left requesting return call to pre surgical testing department.

## 2023-08-30 ENCOUNTER — Inpatient Hospital Stay

## 2023-08-30 ENCOUNTER — Inpatient Hospital Stay: Attending: Oncology

## 2023-08-30 ENCOUNTER — Inpatient Hospital Stay (HOSPITAL_BASED_OUTPATIENT_CLINIC_OR_DEPARTMENT_OTHER): Admitting: Oncology

## 2023-08-30 VITALS — BP 163/80 | HR 72 | Temp 98.2°F | Resp 18 | Ht 73.0 in | Wt 185.8 lb

## 2023-08-30 DIAGNOSIS — C185 Malignant neoplasm of splenic flexure: Secondary | ICD-10-CM

## 2023-08-30 DIAGNOSIS — R059 Cough, unspecified: Secondary | ICD-10-CM | POA: Insufficient documentation

## 2023-08-30 DIAGNOSIS — C189 Malignant neoplasm of colon, unspecified: Secondary | ICD-10-CM

## 2023-08-30 DIAGNOSIS — C186 Malignant neoplasm of descending colon: Secondary | ICD-10-CM | POA: Diagnosis present

## 2023-08-30 DIAGNOSIS — Z452 Encounter for adjustment and management of vascular access device: Secondary | ICD-10-CM | POA: Diagnosis present

## 2023-08-30 LAB — CBC WITH DIFFERENTIAL (CANCER CENTER ONLY)
Abs Immature Granulocytes: 0.04 10*3/uL (ref 0.00–0.07)
Basophils Absolute: 0.1 10*3/uL (ref 0.0–0.1)
Basophils Relative: 1 %
Eosinophils Absolute: 0.3 10*3/uL (ref 0.0–0.5)
Eosinophils Relative: 4 %
HCT: 26.5 % — ABNORMAL LOW (ref 39.0–52.0)
Hemoglobin: 8.7 g/dL — ABNORMAL LOW (ref 13.0–17.0)
Immature Granulocytes: 1 %
Lymphocytes Relative: 19 %
Lymphs Abs: 1.3 10*3/uL (ref 0.7–4.0)
MCH: 30.3 pg (ref 26.0–34.0)
MCHC: 32.8 g/dL (ref 30.0–36.0)
MCV: 92.3 fL (ref 80.0–100.0)
Monocytes Absolute: 0.6 10*3/uL (ref 0.1–1.0)
Monocytes Relative: 9 %
Neutro Abs: 4.6 10*3/uL (ref 1.7–7.7)
Neutrophils Relative %: 66 %
Platelet Count: 196 10*3/uL (ref 150–400)
RBC: 2.87 MIL/uL — ABNORMAL LOW (ref 4.22–5.81)
RDW: 14 % (ref 11.5–15.5)
WBC Count: 6.9 10*3/uL (ref 4.0–10.5)
nRBC: 0 % (ref 0.0–0.2)

## 2023-08-30 LAB — CMP (CANCER CENTER ONLY)
ALT: 5 U/L (ref 0–44)
AST: 9 U/L — ABNORMAL LOW (ref 15–41)
Albumin: 3.2 g/dL — ABNORMAL LOW (ref 3.5–5.0)
Alkaline Phosphatase: 171 U/L — ABNORMAL HIGH (ref 38–126)
Anion gap: 10 (ref 5–15)
BUN: 50 mg/dL — ABNORMAL HIGH (ref 6–20)
CO2: 18 mmol/L — ABNORMAL LOW (ref 22–32)
Calcium: 7.7 mg/dL — ABNORMAL LOW (ref 8.9–10.3)
Chloride: 108 mmol/L (ref 98–111)
Creatinine: 6.11 mg/dL — ABNORMAL HIGH (ref 0.61–1.24)
GFR, Estimated: 10 mL/min — ABNORMAL LOW (ref >60)
Glucose, Bld: 134 mg/dL — ABNORMAL HIGH (ref 70–99)
Potassium: 4 mmol/L (ref 3.5–5.1)
Sodium: 136 mmol/L (ref 135–145)
Total Bilirubin: 0.5 mg/dL (ref 0.0–1.2)
Total Protein: 6.9 g/dL (ref 6.5–8.1)

## 2023-08-30 LAB — CEA (ACCESS): CEA (CHCC): 101.01 ng/mL — ABNORMAL HIGH (ref 0.00–5.00)

## 2023-08-30 MED ORDER — SODIUM CHLORIDE 0.9% FLUSH
10.0000 mL | INTRAVENOUS | Status: DC | PRN
  Administered 2023-08-30: 10 mL

## 2023-08-30 MED ORDER — HEPARIN SOD (PORK) LOCK FLUSH 100 UNIT/ML IV SOLN
500.0000 [IU] | Freq: Once | INTRAVENOUS | Status: AC | PRN
  Administered 2023-08-30: 500 [IU]

## 2023-08-30 NOTE — Progress Notes (Signed)
 DISCONTINUE ON PATHWAY REGIMEN - Colorectal     A cycle is every 14 days:     Oxaliplatin      Leucovorin      Fluorouracil      Fluorouracil   **Always confirm dose/schedule in your pharmacy ordering system**  PRIOR TREATMENT: COS67: [id number] q14 Days x 6 Months  START ON PATHWAY REGIMEN - Colorectal     A cycle is every 14 days:     Irinotecan      Leucovorin      Fluorouracil      Fluorouracil   **Always confirm dose/schedule in your pharmacy ordering system**  Patient Characteristics: Distant Metastases, Nonsurgical Candidate, Non-KRAS G12C, RAS Mutation Positive/Unknown (BRAF V600 Wild-Type/Unknown), Standard Cytotoxic Therapy, First Line Standard Cytotoxic Therapy, Bevacizumab Ineligible, PS = 0,1 Tumor Location: Colon Therapeutic Status: Distant Metastases Microsatellite/Mismatch Repair Status: MSS/pMMR BRAF Mutation Status: Wild-Type (no mutation) KRAS/NRAS Mutation Status: Non-KRAS G12C, RAS Mutation Positive Preferred Therapy Approach: Standard Cytotoxic Therapy Standard Cytotoxic Line of Therapy: First Line Standard Cytotoxic Therapy ECOG Performance Status: 1 Bevacizumab Eligibility: Ineligible Intent of Therapy: Non-Curative / Palliative Intent, Discussed with Patient

## 2023-08-30 NOTE — Progress Notes (Signed)
 [facility] OFFICE PROGRESS NOTE   Diagnosis: Colon cancer  INTERVAL HISTORY:   Jeffrey Young returns as scheduled.  He generally feels well.  He has an occasional cough.  He reports his energy level is variable.  He is scheduled for exchange of the bile duct stent tomorrow. He has been evaluated by nephrology for renal failure.  He indicates he does not wish to undergo dialysis. Objective:  Vital signs in last 24 hours:  Blood pressure (!) 163/80, pulse 72, temperature 98.2 F (36.8 C), temperature source Temporal, resp. rate 18, height 6\' 1"  (1.854 m), weight 185 lb 12.8 oz (84.3 kg), SpO2 99%.    Resp: Lungs clear bilaterally Cardio: Regular rate and rhythm GI: No hepatosplenomegaly, nontender Vascular: No leg edema   Portacath/PICC-without erythema  Lab Results:  Lab Results  Component Value Date   WBC 6.9 08/30/2023   HGB 8.7 (L) 08/30/2023   HCT 26.5 (L) 08/30/2023   MCV 92.3 08/30/2023   PLT 196 08/30/2023   NEUTROABS 4.6 08/30/2023    CMP  Lab Results  Component Value Date   NA 136 07/09/2023   K 4.2 07/09/2023   CL 106 07/09/2023   CO2 19 (L) 07/09/2023   GLUCOSE 197 (H) 07/09/2023   BUN 48 (H) 07/09/2023   CREATININE 5.59 (H) 07/09/2023   CALCIUM 7.1 (L) 07/09/2023   PROT 7.4 07/09/2023   ALBUMIN 3.1 (L) 07/09/2023   AST 15 07/09/2023   ALT 11 07/09/2023   ALKPHOS 178 (H) 07/09/2023   BILITOT 0.9 07/09/2023   GFRNONAA 11 (L) 07/09/2023   GFRAA >60 01/26/2018    Lab Results  Component Value Date   CEA1 11.44 (H) 09/27/2020   CEA 39.27 (H) 07/09/2023    Medications: I have reviewed the patient's current medications.   Assessment/Plan: Descending colon cancer, stage IIIc (T3N2b M0), status post a partial left colectomy 07/30/2020, 9/16 lymph nodes positive, lymphovascular invasion, 1 satellite nodule, negative margins, MSS, no loss of mismatch repair protein expression; foundation 1 K-ras wild-type, NRAS Q61H, microsatellite  stable, tumor mutation burden 4. -History of large polyp in the left side of the colon-referred to [facility] in 05/2018 for procedure canceled secondary to COVID-19 pandemic.  Procedure was not rescheduled. -CT chest/abdomen/pelvis with contrast 07/27/2020-3 small pulmonary nodules less than 5 mm favored to be benign, circumferential luminal narrowing of the distal transverse colon concerning for malignancy, no metastatic adenopathy in the mesentery porta hepatis, no for metastasis. -CEA on 07/27/2020 was 17.3; 37 on 08/30/2020; 33 on 09/13/2020 -Colonoscopy performed 07/27/2020 showed a fungating, infiltrative and ulcerated nonobstructing large mass in the proximal descending colon.  Biopsy-adenocarcinoma -Cycle 1 FOLFOX 08/30/2020 -Cycle 2 FOLFOX 09/13/2020, Emend added for delayed nausea -Cycle 3 FOLFOX 09/27/2020 -Cycle 4 FOLFOX 10/11/2020 -Cycle 5 FOLFOX 11/08/2020 -Cycle 6 FOLFOX 11/23/2020 -CT 12/03/2020-prior 3 mm left apical nodule no longer seen, no new/suspicious pulmonary nodules, no evidence of metastatic disease -Cycle 7 FOLFOX 12/06/2020 -Cycle 8 FOLFOX 12/21/2020 -Cycle 9 FOLFOX 01/03/2021 -Cycle 10 FOLFOX 01/17/2021 -Cycle 11 FOLFOX 01/31/2021, oxaliplatin held secondary to neuropathy symptoms -Cycle 12 FOLFOX 02/15/2021, oxaliplatin held secondary to neuropathy -CT abdomen/pelvis 04/02/2021-no evidence of recurrent colon cancer -CT chest 04/08/2021-no evidence of metastatic disease -Elevated CEA February and March 2023 -08/17/2021 PET scan-no evidence of local recurrence or metastasis -09/26/2021-Guardant-ctDNA detected -Colonoscopy 10/04/2021-negative -CT 11/17/2021-no evidence of metastatic disease -CTs 01/08/2022-no evidence of metastatic disease -PET scan 08/10/2022-several newly enlarged lymph nodes with moderate metabolic activity -CT 11/06/2022-stable left supraclavicular and retroperitoneal nodes,  mild progression of pelvic adenopathy, new 6 mm segment 2 liver lesion -CT 03/05/2023-increase size of  left supraclavicular node and retroperitoneal nodes, increased size of gastropathic and left iliac side chain lymph nodes, stable subtle hypoattenuating lesions at segment 2 in the hepatic dome -CT abdomen/pelvis 05/07/2023-new intrahepatic and extrahepatic biliary duct dilatation with possible associated periportal edema.  Common duct dilated up to 15 mm diameter, abruptly terminates prior to entering the head of the pancreas.  Distended gallbladder with possible gallbladder wall thickening and pericholecystic edema.  No substantial change in lymphadenopathy of the gastrohepatic ligament, hepatoduodenal ligament and left external iliac chain. 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  Moderate diffuse intrahepatic and proximal extrahepatic biliary duct dilatation.  Mild gallbladder distention with moderate diffuse gallbladder wall thickening.  Minimal pericholecystic fluid and fat stranding.  Solitary 1.1 cm anterior segment 2 left liver mass.  Multiple mildly enlarged porta hepatis and portacaval nodes. -Biopsy perihepatic lymph node 05/10/2023-malignant cells, adenocarcinoma -CTs 07/02/2023-persistent biliary duct dilatation with indwelling stent.  Persistent wall thickening of the gallbladder.  Subtle areas of nodal enlargement identified in the abdomen and pelvis, similar to recent prior examinations.  Enlarged node in the supraclavicular region appears smaller.  No new thoracic nodes.  Mild areas of groundglass in the upper lobes of the lungs bilaterally, nonspecific. 2.  Anemia due to GI bleeding, iron deficiency?,  Renal insufficiency? 3.  New onset acute diastolic CHF March 2022 4.  Diabetes mellitus 5.  Renal insufficiency 6.  Hypertension 7.  History of left transmetatarsal amputation 8.  History of colon polyps 9.  Neuropathy 10.  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  Admission with obstructive jaundice 05/07/2023 - 05/22/2023.  Upper EUS 05/10/2023-lymph node visualized and measured in the porta hepatis region; FNA showed malignant cells, adenocarcinoma.  ERCP 05/10/2023-biliary tract obstruction secondary to a mass in the common bile duct, plastic stent placed into the common bile duct.  ERCP 05/18/2023-visibly patent stent from the common bile duct was seen in the major papilla.  Stent removed from the common bile duct.  2 plastic stents placed into the common bile duct.     Disposition: Jeffrey Young has metastatic colon cancer.  He would like to proceed with systemic chemotherapy.  We have previously discussed FOLFIRI chemotherapy.  We discussed the potential toxicities associated with this regimen again today.  He understands the goals of chemotherapy are to palliate symptoms and extend survival.  No therapy will be curative. Jeffrey Young has multiple comorbid conditions including renal failure which may increase toxicity from chemotherapy.  He will be scheduled for FOLFIRI beginning 09/10/2023.  We will follow-up on the recent evaluation from nephrology.  Jeffrey Papas, MD  08/30/2023  8:34 AM

## 2023-08-31 ENCOUNTER — Other Ambulatory Visit: Payer: Self-pay

## 2023-08-31 ENCOUNTER — Ambulatory Visit (HOSPITAL_COMMUNITY): Admitting: Certified Registered Nurse Anesthetist

## 2023-08-31 ENCOUNTER — Ambulatory Visit (HOSPITAL_COMMUNITY)
Admission: RE | Admit: 2023-08-31 | Discharge: 2023-08-31 | Disposition: A | Discharge status: Home / Self Care | Attending: Gastroenterology | Admitting: Gastroenterology

## 2023-08-31 ENCOUNTER — Encounter (HOSPITAL_COMMUNITY)
Admission: RE | Disposition: A | Discharge status: Home / Self Care | Payer: Self-pay | Source: Home / Self Care | Attending: Gastroenterology

## 2023-08-31 DIAGNOSIS — Z539 Procedure and treatment not carried out, unspecified reason: Secondary | ICD-10-CM | POA: Diagnosis not present

## 2023-08-31 DIAGNOSIS — Z4659 Encounter for fitting and adjustment of other gastrointestinal appliance and device: Secondary | ICD-10-CM | POA: Diagnosis not present

## 2023-08-31 DIAGNOSIS — I10 Essential (primary) hypertension: Secondary | ICD-10-CM | POA: Diagnosis not present

## 2023-08-31 DIAGNOSIS — K831 Obstruction of bile duct: Secondary | ICD-10-CM | POA: Diagnosis present

## 2023-08-31 LAB — GLUCOSE, CAPILLARY: Glucose-Capillary: 109 mg/dL — ABNORMAL HIGH (ref 70–99)

## 2023-08-31 SURGERY — CANCELLED PROCEDURE
Anesthesia: General

## 2023-08-31 MED ORDER — ACETAMINOPHEN 10 MG/ML IV SOLN: 1000.0000 mg | Freq: Once | INTRAVENOUS | Status: DC | PRN

## 2023-08-31 MED ORDER — GLUCAGON HCL RDNA (DIAGNOSTIC) 1 MG IJ SOLR
INTRAMUSCULAR | Status: AC
  Filled 2023-08-31: qty 1

## 2023-08-31 MED ORDER — HYDRALAZINE HCL 20 MG/ML IJ SOLN
INTRAMUSCULAR | Status: AC
  Filled 2023-08-31: qty 1

## 2023-08-31 MED ORDER — FENTANYL CITRATE (PF) 100 MCG/2ML IJ SOLN
INTRAMUSCULAR | Status: AC
  Filled 2023-08-31: qty 2

## 2023-08-31 MED ORDER — HYDRALAZINE HCL 20 MG/ML IJ SOLN
10.0000 mg | Freq: Once | INTRAMUSCULAR | Status: AC
  Administered 2023-08-31: 10 mg via INTRAVENOUS

## 2023-08-31 MED ORDER — CIPROFLOXACIN IN D5W 400 MG/200ML IV SOLN
INTRAVENOUS | Status: AC
  Filled 2023-08-31: qty 200

## 2023-08-31 MED ORDER — DICLOFENAC SUPPOSITORY 100 MG
RECTAL | Status: AC
  Filled 2023-08-31: qty 1

## 2023-08-31 MED ORDER — PROPOFOL 10 MG/ML IV BOLUS
INTRAVENOUS | Status: AC
  Filled 2023-08-31: qty 20

## 2023-08-31 MED ORDER — FENTANYL CITRATE (PF) 100 MCG/2ML IJ SOLN: 25.0000 ug | INTRAMUSCULAR | Status: DC | PRN

## 2023-08-31 NOTE — H&P (Signed)
 Jeffrey Young HPI: The patient is here for stent exchange for extrinsic compression of his CBD.  The patient's TB was able to normalize.  The patient's creatinine is noted to be increasing and his blood pressure this AM is severely elevated.  Past Medical History:  Diagnosis Date   Arthritis    Hip   Colon cancer (HCC)    Diabetic mononeuropathy associated with type 2 diabetes mellitus (HCC) 03/21/2017   Diabetic retinopathy (HCC) 07/31/2020   Enthesopathy of ankle and tarsus 04/02/2009   Formatting of this note might be different from the original. 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 Type 2 Diabetes Mellitus - Uncomplicated, Uncontrolled    Past Surgical History:  Procedure Laterality Date   AMPUTATION Left 01/22/2018   Procedure: TRANSMETATARSAL AMPUTATION;  Surgeon: Nadara Mustard, MD;  Location: [facility] OR;  Service: Orthopedics;  Laterality: Left;toes   BILIARY STENT PLACEMENT N/A 05/10/2023   Procedure: BILIARY STENT PLACEMENT;  Surgeon: Jeani Hawking, MD;  Location: [facility] ENDOSCOPY;  Service: Gastroenterology;  Laterality: N/A;   BILIARY STENT PLACEMENT N/A 05/18/2023   Procedure: BILIARY STENT PLACEMENT;  Surgeon: Jeani Hawking, MD;  Location: [facility];  Service: Gastroenterology;  Laterality: N/A;   BIOPSY  07/27/2020    Procedure: BIOPSY;  Surgeon: Jeani Hawking, MD;  Location: [facility]OSCOPY;  Service: Endoscopy;;   COLON RESECTION N/A 07/30/2020   Procedure: HAND ASSISTED LAPAROSCOPIC LEFT HEMI COLECTOMY;  Surgeon: Luretha Murphy, MD;  Location: [facility] ORS;  Service: General;  Laterality: N/A;   COLONOSCOPY WITH PROPOFOL N/A 05/24/2018   Procedure: COLONOSCOPY WITH PROPOFOL;  Surgeon: Jeani Hawking, MD;  Location: [facility];  Service: Endoscopy;  Laterality: N/A;   COLONOSCOPY WITH PROPOFOL N/A 07/27/2020   Procedure: COLONOSCOPY WITH PROPOFOL;  Surgeon: Jeani Hawking, MD;  Location: [facility];  Service: Endoscopy;  Laterality: N/A;   ERCP N/A 05/10/2023   Procedure: ENDOSCOPIC RETROGRADE CHOLANGIOPANCREATOGRAPHY (ERCP);  Surgeon: Jeani Hawking, MD;  Location: [facility];  Service: Gastroenterology;  Laterality: N/A;   ERCP N/A 05/18/2023   Procedure: ENDOSCOPIC RETROGRADE CHOLANGIOPANCREATOGRAPHY (ERCP);  Surgeon: Jeani Hawking, MD;  Location: [facility];  Service: Gastroenterology;  Laterality: N/A;   ESOPHAGOGASTRODUODENOSCOPY Left 04/05/2021   Procedure: ESOPHAGOGASTRODUODENOSCOPY (EGD);  Surgeon: Jeani Hawking, MD;  Location: [facility];  Service: Endoscopy;  Laterality: Left;   ESOPHAGOGASTRODUODENOSCOPY N/A 05/10/2023   Procedure: ESOPHAGOGASTRODUODENOSCOPY (EGD);  Surgeon: Jeani Hawking, MD;  Location: [facility];  Service: Gastroenterology;  Laterality: N/A;   EUS N/A 05/10/2023   Procedure: UPPER ENDOSCOPIC ULTRASOUND (EUS) LINEAR;  Surgeon: Jeani Hawking, MD;  Location: [facility];  Service: Gastroenterology;  Laterality: N/A;   FINE NEEDLE ASPIRATION N/A 05/10/2023   Procedure: FINE NEEDLE ASPIRATION (FNA) LINEAR;  Surgeon: Jeani Hawking, MD;  Location: [facility];  Service: Gastroenterology;  Laterality: N/A;   POLYPECTOMY  05/24/2018   Procedure: POLYPECTOMY;  Surgeon: Jeani Hawking, MD;  Location: [facility];  Service: Endoscopy;;   PORTACATH PLACEMENT Left 08/24/2020   Procedure:  INSERTION PORT-A-CATH;  Surgeon: Luretha Murphy, MD;  Location: [facility];  Service: General;  Laterality: Left;  75/rm1   SPHINCTEROTOMY  05/10/2023   Procedure: Dennison Mascot;  Surgeon: Jeani Hawking, MD;  Location: [facility];  Service: Gastroenterology;;   Dennison Mascot  05/18/2023   Procedure: Dennison Mascot;  Surgeon: Jeani Hawking, MD;  Location: [facility];  Service: Gastroenterology;;   Francine Graven REMOVAL  05/18/2023   Procedure: STENT REMOVAL;  Surgeon: Jeani Hawking, MD;  Location: [facility];  Service: Gastroenterology;;   SUBMUCOSAL TATTOO INJECTION  07/27/2020   Procedure: SUBMUCOSAL TATTOO INJECTION;  Surgeon: Jeani Hawking, MD;  Location: [facility];  Service: Endoscopy;;    Family History  Problem Relation Age of Onset   Hypertension Father     Social History:  reports that he has never smoked. He has never used smokeless tobacco. He reports current alcohol use of about 1.0 standard drink of alcohol per week. He reports that he does not use drugs.  Allergies:  Allergies  Allergen Reactions   Bee Venom Anaphylaxis, Swelling and Other (See Comments)    Cold Sweats, also   Latex Rash    Medications: Scheduled: Continuous:  acetaminophen      Results for orders placed or performed during the hospital encounter of 08/31/23 (from the past 24 hours)  Glucose, capillary     Status: Abnormal   Collection Time: 08/31/23  6:57 AM  Result Value Ref Range   Glucose-Capillary 109 (H) 70 - 99 mg/dL     No results found.  ROS:  As stated above in the HPI otherwise negative.  Blood pressure (!) 203/91, pulse 72, temperature 97.9 F (36.6 C), temperature source Tympanic, resp. rate 16, height 6\' 1"  (1.854 m), weight 84.3 kg, SpO2 100%.    PE: Gen: NAD, Alert and Oriented HEENT:  [place]/AT, EOMI Neck: Supple, no LAD Lungs: CTA Bilaterally CV: RRR without M/G/R ABD: Soft, NTND, +BS Ext: No C/C/E  Assessment/Plan: 1) Extrinsic biliary compression. 2) HTN. 3)  Worsening creatinine.  Plan: 1) ERCP with stent exchange.  [name] 08/31/2023, 7:37 AM

## 2023-08-31 NOTE — Progress Notes (Signed)
 Dr. Nance Pew in to see patient. Made aware of increased blood pressure. Order for 10 mg hydralazine and to continue to assess.

## 2023-08-31 NOTE — Progress Notes (Signed)
 Pt presented to endoscopy today for ERCP with MD Elnoria Howard. Pre-operatively, pt was markedly hypertensive with a BP of 217/110 and subsequently 242/104. Also, serum creatinine on 4/10 was elevated at 6.11.  MDA Stoltzfus assessed the patient at the bedside and ordered 10 mg hydralazine IV and to continue to monitor BP for 30 minutes.   After administration of hydralazine, SBP decreased to 190-203 and DBP of 87-91 (see flowsheet data). MDA Stoltzfus notified, order to cancel procedure for today.  Procedure cancelled.  Eulas Post, RN 08/31/23 8:09 AM

## 2023-08-31 NOTE — Anesthesia Preprocedure Evaluation (Signed)
 Anesthesia Evaluation  Patient identified by MRN, date of birth, ID band Patient awake    Reviewed: Allergy & Precautions, NPO status , Patient's Chart, lab work & pertinent test results  Airway Mallampati: II  TM Distance: >3 FB Neck ROM: Full    Dental no notable dental hx.    Pulmonary neg pulmonary ROS   Pulmonary exam normal        Cardiovascular hypertension, Pt. on medications and Pt. on home beta blockers +CHF   Rhythm:Regular Rate:Normal     Neuro/Psych  negative psych ROS   GI/Hepatic Neg liver ROS,GERD  Medicated,,Biliary duct stent exchange   Endo/Other  diabetes, Type 2    Renal/GU   negative genitourinary   Musculoskeletal  (+) Arthritis ,    Abdominal Normal abdominal exam  (+)   Peds  Hematology  (+) Blood dyscrasia, anemia   Anesthesia Other Findings   Reproductive/Obstetrics                             Anesthesia Physical Anesthesia Plan  ASA: 3  Anesthesia Plan: General   Post-op Pain Management:    Induction: Intravenous  PONV Risk Score and Plan: 2 and Treatment may vary due to age or medical condition  Airway Management Planned: Mask and Oral ETT  Additional Equipment: None  Intra-op Plan:   Post-operative Plan: Extubation in OR  Informed Consent: I have reviewed the patients History and Physical, chart, labs and discussed the procedure including the risks, benefits and alternatives for the proposed anesthesia with the patient or authorized representative who has indicated his/her understanding and acceptance.     Dental advisory given  Plan Discussed with: CRNA  Anesthesia Plan Comments:        Anesthesia Quick Evaluation

## 2023-09-01 ENCOUNTER — Other Ambulatory Visit: Payer: Self-pay

## 2023-09-04 ENCOUNTER — Other Ambulatory Visit: Payer: Self-pay

## 2023-09-06 ENCOUNTER — Other Ambulatory Visit: Payer: Self-pay | Admitting: Oncology

## 2023-09-11 ENCOUNTER — Inpatient Hospital Stay

## 2023-09-11 ENCOUNTER — Inpatient Hospital Stay (HOSPITAL_BASED_OUTPATIENT_CLINIC_OR_DEPARTMENT_OTHER): Admitting: Oncology

## 2023-09-11 VITALS — BP 142/87 | HR 78 | Temp 98.1°F | Resp 18 | Ht 73.0 in | Wt 184.2 lb

## 2023-09-11 DIAGNOSIS — C185 Malignant neoplasm of splenic flexure: Secondary | ICD-10-CM | POA: Diagnosis not present

## 2023-09-11 DIAGNOSIS — Z452 Encounter for adjustment and management of vascular access device: Secondary | ICD-10-CM | POA: Diagnosis not present

## 2023-09-11 DIAGNOSIS — Z95828 Presence of other vascular implants and grafts: Secondary | ICD-10-CM

## 2023-09-11 LAB — CMP (CANCER CENTER ONLY)
ALT: 8 U/L (ref 0–44)
AST: 12 U/L — ABNORMAL LOW (ref 15–41)
Albumin: 3.4 g/dL — ABNORMAL LOW (ref 3.5–5.0)
Alkaline Phosphatase: 153 U/L — ABNORMAL HIGH (ref 38–126)
Anion gap: 13 (ref 5–15)
BUN: 43 mg/dL — ABNORMAL HIGH (ref 6–20)
CO2: 18 mmol/L — ABNORMAL LOW (ref 22–32)
Calcium: 7.7 mg/dL — ABNORMAL LOW (ref 8.9–10.3)
Chloride: 103 mmol/L (ref 98–111)
Creatinine: 6.66 mg/dL — ABNORMAL HIGH (ref 0.61–1.24)
GFR, Estimated: 9 mL/min — ABNORMAL LOW (ref >60)
Glucose, Bld: 117 mg/dL — ABNORMAL HIGH (ref 70–99)
Potassium: 4.3 mmol/L (ref 3.5–5.1)
Sodium: 135 mmol/L (ref 135–145)
Total Bilirubin: 0.4 mg/dL (ref 0.0–1.2)
Total Protein: 6.6 g/dL (ref 6.5–8.1)

## 2023-09-11 LAB — CBC WITH DIFFERENTIAL (CANCER CENTER ONLY)
Abs Immature Granulocytes: 0.03 10*3/uL (ref 0.00–0.07)
Basophils Absolute: 0 10*3/uL (ref 0.0–0.1)
Basophils Relative: 1 %
Eosinophils Absolute: 0.2 10*3/uL (ref 0.0–0.5)
Eosinophils Relative: 3 %
HCT: 25.3 % — ABNORMAL LOW (ref 39.0–52.0)
Hemoglobin: 8.3 g/dL — ABNORMAL LOW (ref 13.0–17.0)
Immature Granulocytes: 0 %
Lymphocytes Relative: 20 %
Lymphs Abs: 1.4 10*3/uL (ref 0.7–4.0)
MCH: 30.7 pg (ref 26.0–34.0)
MCHC: 32.8 g/dL (ref 30.0–36.0)
MCV: 93.7 fL (ref 80.0–100.0)
Monocytes Absolute: 0.6 10*3/uL (ref 0.1–1.0)
Monocytes Relative: 9 %
Neutro Abs: 4.7 10*3/uL (ref 1.7–7.7)
Neutrophils Relative %: 67 %
Platelet Count: 195 10*3/uL (ref 150–400)
RBC: 2.7 MIL/uL — ABNORMAL LOW (ref 4.22–5.81)
RDW: 13.9 % (ref 11.5–15.5)
WBC Count: 6.9 10*3/uL (ref 4.0–10.5)
nRBC: 0 % (ref 0.0–0.2)

## 2023-09-11 MED ORDER — HEPARIN SOD (PORK) LOCK FLUSH 100 UNIT/ML IV SOLN
500.0000 [IU] | Freq: Once | INTRAVENOUS | Status: AC
Start: 2023-09-11 — End: 2023-09-11
  Administered 2023-09-11: 500 [IU] via INTRAVENOUS

## 2023-09-11 MED ORDER — SODIUM CHLORIDE 0.9% FLUSH
10.0000 mL | Freq: Once | INTRAVENOUS | Status: AC
  Administered 2023-09-11: 10 mL via INTRAVENOUS

## 2023-09-11 MED ORDER — SODIUM CHLORIDE 0.9% FLUSH
10.0000 mL | INTRAVENOUS | Status: DC | PRN
  Administered 2023-09-11: 10 mL via INTRAVENOUS

## 2023-09-11 NOTE — Patient Instructions (Signed)

## 2023-09-11 NOTE — Progress Notes (Signed)
 Not receiving chemotherapy today due to renal functions. Nurse navigator working on referral to [facility]. Printed his appointment calendar for May

## 2023-09-11 NOTE — Progress Notes (Signed)
 [facility] OFFICE PROGRESS NOTE   Diagnosis: Colon cancer  INTERVAL HISTORY:  Jeffrey Young returns as scheduled.  He was scheduled for biliary stent exchange on 08/31/2023.  The procedure was aborted due to hypertension and elevation of the creatinine. Jeffrey Young continues follow-up with the wound clinic for management of the left foot ulcer.  He reports the ulcer is healing.  No new complaint.  He saw nephrology in February, but has not return for a follow-up visit.  He plans to schedule appointment with nephrology at the [facility].  Objective:  Vital signs in last 24 hours:  Blood pressure (!) 142/87, pulse 78, temperature 98.1 F (36.7 C), temperature source Temporal, resp. rate 18, height 6\' 1"  (1.854 m), weight 184 lb 3.2 oz (83.6 kg), SpO2 100%.     Resp: Lungs clear bilaterally Cardio: Regular rate and rhythm GI: No hepatosplenomegaly Vascular: No leg edema  Skin: 3 cm ulcer at the plantar surface of the left foot.  There is callus formation and superficial ulceration.  No drainage or surrounding erythema  Portacath/PICC-without erythema  Lab Results:  Lab Results  Component Value Date   WBC 6.9 09/11/2023   HGB 8.3 (L) 09/11/2023   HCT 25.3 (L) 09/11/2023   MCV 93.7 09/11/2023   PLT 195 09/11/2023   NEUTROABS 4.7 09/11/2023    CMP  Lab Results  Component Value Date   NA 136 08/30/2023   K 4.0 08/30/2023   CL 108 08/30/2023   CO2 18 (L) 08/30/2023   GLUCOSE 134 (H) 08/30/2023   BUN 50 (H) 08/30/2023   CREATININE 6.11 (H) 08/30/2023   CALCIUM  7.7 (L) 08/30/2023   PROT 6.9 08/30/2023   ALBUMIN  3.2 (L) 08/30/2023   AST 9 (L) 08/30/2023   ALT 5 08/30/2023   ALKPHOS 171 (H) 08/30/2023   BILITOT 0.5 08/30/2023   GFRNONAA 10 (L) 08/30/2023   GFRAA >60 01/26/2018    Lab Results  Component Value Date   CEA1 11.44 (H) 09/27/2020   CEA 101.01 (H) 08/30/2023    Lab Results  Component Value Date   INR 0.9 04/01/2021   LABPROT 12.4 04/01/2021     Imaging:  No results found.  Medications: I have reviewed the patient's current medications.   Assessment/Plan: Descending colon cancer, stage IIIc (T3N2b M0), status post a partial left colectomy 07/30/2020, 9/16 lymph nodes positive, lymphovascular invasion, 1 satellite nodule, negative margins, MSS, no loss of mismatch repair protein expression; foundation 1 K-ras wild-type, NRAS Q61H, microsatellite stable, tumor mutation burden 4. -History of large polyp in the left side of the colon-referred to [facility] in 05/2018 for procedure canceled secondary to COVID-19 pandemic.  Procedure was not rescheduled. -CT chest/abdomen/pelvis with contrast 07/27/2020-3 small pulmonary nodules less than 5 mm favored to be benign, circumferential luminal narrowing of the distal transverse colon concerning for malignancy, no metastatic adenopathy in the mesentery porta hepatis, no for metastasis. -CEA on 07/27/2020 was 17.3; 37 on 08/30/2020; 33 on 09/13/2020 -Colonoscopy performed 07/27/2020 showed a fungating, infiltrative and ulcerated nonobstructing large mass in the proximal descending colon.  Biopsy-adenocarcinoma -Cycle 1 FOLFOX 08/30/2020 -Cycle 2 FOLFOX 09/13/2020, Emend  added for delayed nausea -Cycle 3 FOLFOX 09/27/2020 -Cycle 4 FOLFOX 10/11/2020 -Cycle 5 FOLFOX 11/08/2020 -Cycle 6 FOLFOX 11/23/2020 -CT 12/03/2020-prior 3 mm left apical nodule no longer seen, no new/suspicious pulmonary nodules, no evidence of metastatic disease -Cycle 7 FOLFOX 12/06/2020 -Cycle 8 FOLFOX 12/21/2020 -Cycle 9 FOLFOX 01/03/2021 -Cycle 10 FOLFOX 01/17/2021 -Cycle 11 FOLFOX 01/31/2021, oxaliplatin  held secondary  to neuropathy symptoms -Cycle 12 FOLFOX 02/15/2021, oxaliplatin  held secondary to neuropathy -CT abdomen/pelvis 04/02/2021-no evidence of recurrent colon cancer -CT chest 04/08/2021-no evidence of metastatic disease -Elevated CEA February and March 2023 -08/17/2021 PET scan-no evidence of local recurrence or  metastasis -09/26/2021-Guardant-ctDNA detected -Colonoscopy 10/04/2021-negative -CT 11/17/2021-no evidence of metastatic disease -CTs 01/08/2022-no evidence of metastatic disease -PET scan 08/10/2022-several newly enlarged lymph nodes with moderate metabolic activity -CT 11/06/2022-stable left supraclavicular and retroperitoneal nodes, mild progression of pelvic adenopathy, new 6 mm segment 2 liver lesion -CT 03/05/2023-increase size of left supraclavicular node and retroperitoneal nodes, increased size of gastropathic and left iliac side chain lymph nodes, stable subtle hypoattenuating lesions at segment 2 in the hepatic dome -CT abdomen/pelvis 05/07/2023-new intrahepatic and extrahepatic biliary duct dilatation with possible associated periportal edema.  Common duct dilated up to 15 mm diameter, abruptly terminates prior to entering the head of the pancreas.  Distended gallbladder with possible gallbladder wall thickening and pericholecystic edema.  No substantial change in lymphadenopathy of the gastrohepatic ligament, hepatoduodenal ligament and left external iliac chain. 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  Moderate diffuse intrahepatic and proximal extrahepatic biliary duct dilatation.  Mild gallbladder distention with moderate diffuse gallbladder wall thickening.  Minimal pericholecystic fluid and fat stranding.  Solitary 1.1 cm anterior segment 2 left liver mass.  Multiple mildly enlarged porta hepatis and portacaval nodes. -Biopsy perihepatic lymph node 05/10/2023-malignant cells, adenocarcinoma -CTs 07/02/2023-persistent biliary duct dilatation with indwelling stent.  Persistent wall thickening of the gallbladder.  Subtle areas of nodal enlargement identified in the abdomen and pelvis, similar to recent prior examinations.  Enlarged node in the supraclavicular region appears  smaller.  No new thoracic nodes.  Mild areas of groundglass in the upper lobes of the lungs bilaterally, nonspecific. 2.  Anemia due to GI bleeding, iron deficiency?,  Renal insufficiency? 3.  New onset acute diastolic CHF March 2022 4.  Diabetes mellitus 5.  Renal insufficiency 6.  Hypertension 7.  History of left transmetatarsal amputation 8.  History of colon polyps 9.  Neuropathy 10.  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  Admission with obstructive jaundice 05/07/2023 - 05/22/2023.  Upper EUS 05/10/2023-lymph node visualized and measured in the porta hepatis region; FNA showed malignant cells, adenocarcinoma.  ERCP 05/10/2023-biliary tract obstruction secondary to a mass in the common bile duct, plastic stent placed into the common bile duct.  ERCP 05/18/2023-visibly patent stent from the common bile duct was seen in the major papilla.  Stent removed from the common bile duct.  2 plastic stents placed into the common bile duct.      Disposition: Jeffrey. Young has metastatic colon cancer.  He is scheduled to begin treatment with FOLFIRI today.  He has progressive renal failure and is not have follow-up with nephrology.  I am reluctant to proceed with chemotherapy until there is a follow-up plan in place for the renal failure.  He may be approaching the need for dialysis.  We decided to hold chemotherapy today and reschedule for 2 weeks.  Irinotecan will be further dose reduced for renal failure.  We will try to arrange for nephrology follow-up within the next 2 weeks.  Coni Deep, MD  09/11/2023  10:13 AM

## 2023-09-12 ENCOUNTER — Other Ambulatory Visit: Payer: Self-pay

## 2023-09-13 ENCOUNTER — Encounter

## 2023-09-14 ENCOUNTER — Other Ambulatory Visit: Payer: Self-pay

## 2023-09-24 ENCOUNTER — Inpatient Hospital Stay

## 2023-09-24 ENCOUNTER — Ambulatory Visit: Admitting: Nurse Practitioner

## 2023-09-24 ENCOUNTER — Telehealth: Payer: Self-pay | Admitting: *Deleted

## 2023-09-24 ENCOUNTER — Other Ambulatory Visit

## 2023-09-24 ENCOUNTER — Inpatient Hospital Stay: Admitting: Nurse Practitioner

## 2023-09-24 ENCOUNTER — Ambulatory Visit

## 2023-09-24 NOTE — Telephone Encounter (Signed)
 Spoke w/Mrs. Micael Adas re: appointment cancellation today. She reports he will see the nephrologist at [facility] on 5/6 for renal assessment. She will keep us  in the loop with what is said and ordered.

## 2023-09-26 ENCOUNTER — Encounter

## 2023-10-04 ENCOUNTER — Other Ambulatory Visit: Payer: Self-pay

## 2023-10-08 ENCOUNTER — Inpatient Hospital Stay

## 2023-10-08 ENCOUNTER — Inpatient Hospital Stay: Attending: Oncology | Admitting: Nurse Practitioner

## 2023-10-08 ENCOUNTER — Encounter: Payer: Self-pay | Admitting: Nurse Practitioner

## 2023-10-08 ENCOUNTER — Telehealth: Payer: Self-pay | Admitting: Nurse Practitioner

## 2023-10-08 VITALS — BP 176/102 | HR 85 | Temp 98.2°F | Resp 18 | Ht 73.0 in | Wt 188.3 lb

## 2023-10-08 DIAGNOSIS — N289 Disorder of kidney and ureter, unspecified: Secondary | ICD-10-CM | POA: Diagnosis not present

## 2023-10-08 DIAGNOSIS — I110 Hypertensive heart disease with heart failure: Secondary | ICD-10-CM | POA: Diagnosis not present

## 2023-10-08 DIAGNOSIS — I5032 Chronic diastolic (congestive) heart failure: Secondary | ICD-10-CM | POA: Insufficient documentation

## 2023-10-08 DIAGNOSIS — Z452 Encounter for adjustment and management of vascular access device: Secondary | ICD-10-CM | POA: Diagnosis present

## 2023-10-08 DIAGNOSIS — C185 Malignant neoplasm of splenic flexure: Secondary | ICD-10-CM

## 2023-10-08 DIAGNOSIS — E119 Type 2 diabetes mellitus without complications: Secondary | ICD-10-CM | POA: Diagnosis not present

## 2023-10-08 DIAGNOSIS — Z95828 Presence of other vascular implants and grafts: Secondary | ICD-10-CM

## 2023-10-08 DIAGNOSIS — C186 Malignant neoplasm of descending colon: Secondary | ICD-10-CM | POA: Insufficient documentation

## 2023-10-08 LAB — CBC WITH DIFFERENTIAL (CANCER CENTER ONLY)
Abs Immature Granulocytes: 0.02 10*3/uL (ref 0.00–0.07)
Basophils Absolute: 0 10*3/uL (ref 0.0–0.1)
Basophils Relative: 1 %
Eosinophils Absolute: 0.2 10*3/uL (ref 0.0–0.5)
Eosinophils Relative: 3 %
HCT: 23.7 % — ABNORMAL LOW (ref 39.0–52.0)
Hemoglobin: 7.9 g/dL — ABNORMAL LOW (ref 13.0–17.0)
Immature Granulocytes: 0 %
Lymphocytes Relative: 19 %
Lymphs Abs: 1.2 10*3/uL (ref 0.7–4.0)
MCH: 31 pg (ref 26.0–34.0)
MCHC: 33.3 g/dL (ref 30.0–36.0)
MCV: 92.9 fL (ref 80.0–100.0)
Monocytes Absolute: 0.6 10*3/uL (ref 0.1–1.0)
Monocytes Relative: 10 %
Neutro Abs: 4.2 10*3/uL (ref 1.7–7.7)
Neutrophils Relative %: 67 %
Platelet Count: 208 10*3/uL (ref 150–400)
RBC: 2.55 MIL/uL — ABNORMAL LOW (ref 4.22–5.81)
RDW: 13.5 % (ref 11.5–15.5)
WBC Count: 6.3 10*3/uL (ref 4.0–10.5)
nRBC: 0 % (ref 0.0–0.2)

## 2023-10-08 LAB — CMP (CANCER CENTER ONLY)
ALT: 7 U/L (ref 0–44)
AST: 11 U/L — ABNORMAL LOW (ref 15–41)
Albumin: 3.2 g/dL — ABNORMAL LOW (ref 3.5–5.0)
Alkaline Phosphatase: 163 U/L — ABNORMAL HIGH (ref 38–126)
Anion gap: 13 (ref 5–15)
BUN: 44 mg/dL — ABNORMAL HIGH (ref 6–20)
CO2: 21 mmol/L — ABNORMAL LOW (ref 22–32)
Calcium: 7.9 mg/dL — ABNORMAL LOW (ref 8.9–10.3)
Chloride: 103 mmol/L (ref 98–111)
Creatinine: 5.73 mg/dL — ABNORMAL HIGH (ref 0.61–1.24)
GFR, Estimated: 11 mL/min — ABNORMAL LOW (ref >60)
Glucose, Bld: 109 mg/dL — ABNORMAL HIGH (ref 70–99)
Potassium: 4 mmol/L (ref 3.5–5.1)
Sodium: 137 mmol/L (ref 135–145)
Total Bilirubin: 0.3 mg/dL (ref 0.0–1.2)
Total Protein: 7 g/dL (ref 6.5–8.1)

## 2023-10-08 LAB — CEA (ACCESS): CEA (CHCC): 102.17 ng/mL — ABNORMAL HIGH (ref 0.00–5.00)

## 2023-10-08 MED ORDER — HEPARIN SOD (PORK) LOCK FLUSH 100 UNIT/ML IV SOLN
500.0000 [IU] | Freq: Once | INTRAVENOUS | Status: AC
  Administered 2023-10-08: 500 [IU] via INTRAVENOUS

## 2023-10-08 MED ORDER — SODIUM CHLORIDE 0.9% FLUSH
10.0000 mL | Freq: Once | INTRAVENOUS | Status: AC
Start: 2023-10-08 — End: 2023-10-08
  Administered 2023-10-08: 10 mL via INTRAVENOUS

## 2023-10-08 MED ORDER — SODIUM CHLORIDE 0.9% FLUSH
10.0000 mL | INTRAVENOUS | Status: DC | PRN
  Administered 2023-10-08: 10 mL via INTRAVENOUS

## 2023-10-08 NOTE — Patient Instructions (Addendum)
Implanted Port Home Guide An implanted port is a device that is placed under the skin. It is usually placed in the chest. The device may vary based on the need. Implanted ports can be used to give IV medicine, to take blood, or to give fluids. You may have an implanted port if: You need IV medicine that would be irritating to the small veins in your hands or arms. You need IV medicines, such as chemotherapy, for a Stege period of time. You need IV nutrition for a Axon period of time. You may have fewer limitations when using a port than you would if you used other types of Mumme-term IVs. You will also likely be able to return to normal activities after your incision heals. An implanted port has two main parts: Reservoir. The reservoir is the part where a needle is inserted to give medicines or draw blood. The reservoir is round. After the port is placed, it appears as a small, raised area under your skin. Catheter. The catheter is a small, thin tube that connects the reservoir to a vein. Medicine that is inserted into the reservoir goes into the catheter and then into the vein. How is my port accessed? To access your port: A numbing cream may be placed on the skin over the port site. Your health care provider will put on a mask and sterile gloves. The skin over your port will be cleaned carefully with a germ-killing soap and allowed to dry. Your health care provider will gently pinch the port and insert a needle into it. Your health care provider will check for a blood return to make sure the port is in the vein and is still working (patent). If your port needs to remain accessed to get medicine continuously (constant infusion), your health care provider will place a clear bandage (dressing) over the needle site. The dressing and needle will need to be changed every week, or as told by your health care provider. What is flushing? Flushing helps keep the port working. Follow instructions from your  health care provider about how and when to flush the port. Ports are usually flushed with saline solution or a medicine called heparin. The need for flushing will depend on how the port is used: If the port is only used from time to time to give medicines or draw blood, the port may need to be flushed: Before and after medicines have been given. Before and after blood has been drawn. As part of routine maintenance. Flushing may be recommended every 4-6 weeks. If a constant infusion is running, the port may not need to be flushed. Throw away any syringes in a disposal container that is meant for sharp items (sharps container). You can buy a sharps container from a pharmacy, or you can make one by using an empty hard plastic bottle with a cover. How Frangos will my port stay implanted? The port can stay in for as Dotts as your health care provider thinks it is needed. When it is time for the port to come out, a surgery will be done to remove it. The surgery will be similar to the procedure that was done to put the port in. Follow these instructions at home: Caring for your port and port site Flush your port as told by your health care provider. If you need an infusion over several days, follow instructions from your health care provider about how to take care of your port site. Make sure you: Change your   dressing as told by your health care provider. Wash your hands with soap and water for at least 20 seconds before and after you change your dressing. If soap and water are not available, use alcohol-based hand sanitizer. Place any used dressings or infusion bags into a plastic bag. Throw that bag in the trash. Keep the dressing that covers the needle clean and dry. Do not get it wet. Do not use scissors or sharp objects near the infusion tubing. Keep any external tubes clamped, unless they are being used. Check your port site every day for signs of infection. Check for: Redness, swelling, or  pain. Fluid or blood. Warmth. Pus or a bad smell. Protect the skin around the port site. Avoid wearing bra straps that rub or irritate the site. Protect the skin around your port from seat belts. Place a soft pad over your chest if needed. Bathe or shower as told by your health care provider. The site may get wet as Depree as you are not actively receiving an infusion. General instructions  Return to your normal activities as told by your health care provider. Ask your health care provider what activities are safe for you. Carry a medical alert card or wear a medical alert bracelet at all times. This will let health care providers know that you have an implanted port in case of an emergency. Where to find more information American Cancer Society: www.cancer.org American Society of Clinical Oncology: www.cancer.net Contact a health care provider if: You have a fever or chills. You have redness, swelling, or pain at the port site. You have fluid or blood coming from your port site. Your incision feels warm to the touch. You have pus or a bad smell coming from the port site. Summary Implanted ports are usually placed in the chest for Hunkele-term IV access. Follow instructions from your health care provider about flushing the port and changing bandages (dressings). Take care of the area around your port by avoiding clothing that puts pressure on the area, and by watching for signs of infection. Protect the skin around your port from seat belts. Place a soft pad over your chest if needed. Contact a health care provider if you have a fever or you have redness, swelling, pain, fluid, or a bad smell at the port site. This information is not intended to replace advice given to you by your health care provider. Make sure you discuss any questions you have with your health care provider. Document Revised: 11/09/2020 Document Reviewed: 11/09/2020 Elsevier Patient Education  2024 [employer] Implanted [facility] Guide An implanted port is a device that is placed under the skin. It is usually placed in the chest. The device may vary based on the need. Implanted ports can be used to give IV medicine, to take blood, or to give fluids. You may have an implanted port if: You need IV medicine that would be irritating to the small veins in your hands or arms. You need IV medicines, such as chemotherapy, for a Shipp period of time. You need IV nutrition for a Ligman period of time. You may have fewer limitations when using a port than you would if you used other types of Kammerer-term IVs. You will also likely be able to return to normal activities after your incision heals. An implanted port has two main parts: Reservoir. The reservoir is the part where a needle is inserted to give medicines or draw blood. The reservoir is round. After the port is  placed, it appears as a small, raised area under your skin. Catheter. The catheter is a small, thin tube that connects the reservoir to a vein. Medicine that is inserted into the reservoir goes into the catheter and then into the vein. How is my port accessed? To access your port: A numbing cream may be placed on the skin over the port site. Your health care provider will put on a mask and sterile gloves. The skin over your port will be cleaned carefully with a germ-killing soap and allowed to dry. Your health care provider will gently pinch the port and insert a needle into it. Your health care provider will check for a blood return to make sure the port is in the vein and is still working (patent). If your port needs to remain accessed to get medicine continuously (constant infusion), your health care provider will place a clear bandage (dressing) over the needle site. The dressing and needle will need to be changed every week, or as told by your health care provider. What is flushing? Flushing helps keep the port working. Follow instructions from  your health care provider about how and when to flush the port. Ports are usually flushed with saline solution or a medicine called heparin. The need for flushing will depend on how the port is used: If the port is only used from time to time to give medicines or draw blood, the port may need to be flushed: Before and after medicines have been given. Before and after blood has been drawn. As part of routine maintenance. Flushing may be recommended every 4-6 weeks. If a constant infusion is running, the port may not need to be flushed. Throw away any syringes in a disposal container that is meant for sharp items (sharps container). You can buy a sharps container from a pharmacy, or you can make one by using an empty hard plastic bottle with a cover. How Szczerba will my port stay implanted? The port can stay in for as Caldron as your health care provider thinks it is needed. When it is time for the port to come out, a surgery will be done to remove it. The surgery will be similar to the procedure that was done to put the port in. Follow these instructions at home: Caring for your port and port site Flush your port as told by your health care provider. If you need an infusion over several days, follow instructions from your health care provider about how to take care of your port site. Make sure you: Change your dressing as told by your health care provider. Wash your hands with soap and water for at least 20 seconds before and after you change your dressing. If soap and water are not available, use alcohol-based hand sanitizer. Place any used dressings or infusion bags into a plastic bag. Throw that bag in the trash. Keep the dressing that covers the needle clean and dry. Do not get it wet. Do not use scissors or sharp objects near the infusion tubing. Keep any external tubes clamped, unless they are being used. Check your port site every day for signs of infection. Check for: Redness, swelling, or  pain. Fluid or blood. Warmth. Pus or a bad smell. Protect the skin around the port site. Avoid wearing bra straps that rub or irritate the site. Protect the skin around your port from seat belts. Place a soft pad over your chest if needed. Bathe or shower as told by your health care  provider. The site may get wet as Gilham as you are not actively receiving an infusion. General instructions  Return to your normal activities as told by your health care provider. Ask your health care provider what activities are safe for you. Carry a medical alert card or wear a medical alert bracelet at all times. This will let health care providers know that you have an implanted port in case of an emergency. Where to find more information American Cancer Society: www.cancer.org American Society of Clinical Oncology: www.cancer.net Contact a health care provider if: You have a fever or chills. You have redness, swelling, or pain at the port site. You have fluid or blood coming from your port site. Your incision feels warm to the touch. You have pus or a bad smell coming from the port site. Summary Implanted ports are usually placed in the chest for Cudworth-term IV access. Follow instructions from your health care provider about flushing the port and changing bandages (dressings). Take care of the area around your port by avoiding clothing that puts pressure on the area, and by watching for signs of infection. Protect the skin around your port from seat belts. Place a soft pad over your chest if needed. Contact a health care provider if you have a fever or you have redness, swelling, pain, fluid, or a bad smell at the port site. This information is not intended to replace advice given to you by your health care provider. Make sure you discuss any questions you have with your health care provider. Document Revised: 11/09/2020 Document Reviewed: 11/09/2020 Elsevier Patient Education  2024 [employer].

## 2023-10-08 NOTE — Addendum Note (Signed)
 Addended by: Rosalita Carey S on: 10/08/2023 09:16 AM   Modules accepted: Orders

## 2023-10-08 NOTE — Telephone Encounter (Signed)
 Patient has been scheduled for follow-up visit per 10/08/23 LOS.  LVM notifying pt of appt details, provided my direct number to pt if appt changes need to be made.

## 2023-10-08 NOTE — Progress Notes (Signed)
 [facility] OFFICE PROGRESS NOTE   Diagnosis:  Colon cancer  INTERVAL HISTORY:   Mr. Westrup returns as scheduled.  He reports a good appetite.  He was nauseated earlier today.  Bowels moving.  He notes he has a watery bowel movement after eating certain foods.  This has been occurring since the biliary stent was placed.  He denies bleeding.  He has seen nephrology at the [facility].  It was advised he will need dialysis at some point but no acute indications to begin now.  Objective:  Vital signs in last 24 hours:  Blood pressure (!) 176/102, pulse 85, temperature 98.2 F (36.8 C), temperature source Temporal, resp. rate 18, height 6\' 1"  (1.854 m), weight 188 lb 4.8 oz (85.4 kg), SpO2 100%.    HEENT: No thrush.  Small ecchymosis left buccal mucosa. Resp: Lungs clear bilaterally. Cardio: Regular rate and rhythm. GI: No hepatosplenomegaly. Vascular: No leg edema. Neuro: Alert and oriented. Skin: 3 to 4 cm ulcer plantar surface of the left foot with superficial ulceration. Port-A-Cath without erythema.  Lab Results:  Lab Results  Component Value Date   WBC 6.3 10/08/2023   HGB 7.9 (L) 10/08/2023   HCT 23.7 (L) 10/08/2023   MCV 92.9 10/08/2023   PLT 208 10/08/2023   NEUTROABS 4.2 10/08/2023    Imaging:  No results found.  Medications: I have reviewed the patient's current medications.  Assessment/Plan: Descending colon cancer, stage IIIc (T3N2b M0), status post a partial left colectomy 07/30/2020, 9/16 lymph nodes positive, lymphovascular invasion, 1 satellite nodule, negative margins, MSS, no loss of mismatch repair protein expression; foundation 1 K-ras wild-type, NRAS Q61H, microsatellite stable, tumor mutation burden 4. -History of large polyp in the left side of the colon-referred to [facility] in 05/2018 for procedure canceled secondary to COVID-19 pandemic.  Procedure was not rescheduled. -CT chest/abdomen/pelvis with contrast 07/27/2020-3 small pulmonary nodules  less than 5 mm favored to be benign, circumferential luminal narrowing of the distal transverse colon concerning for malignancy, no metastatic adenopathy in the mesentery porta hepatis, no for metastasis. -CEA on 07/27/2020 was 17.3; 37 on 08/30/2020; 33 on 09/13/2020 -Colonoscopy performed 07/27/2020 showed a fungating, infiltrative and ulcerated nonobstructing large mass in the proximal descending colon.  Biopsy-adenocarcinoma -Cycle 1 FOLFOX 08/30/2020 -Cycle 2 FOLFOX 09/13/2020, Emend  added for delayed nausea -Cycle 3 FOLFOX 09/27/2020 -Cycle 4 FOLFOX 10/11/2020 -Cycle 5 FOLFOX 11/08/2020 -Cycle 6 FOLFOX 11/23/2020 -CT 12/03/2020-prior 3 mm left apical nodule no longer seen, no new/suspicious pulmonary nodules, no evidence of metastatic disease -Cycle 7 FOLFOX 12/06/2020 -Cycle 8 FOLFOX 12/21/2020 -Cycle 9 FOLFOX 01/03/2021 -Cycle 10 FOLFOX 01/17/2021 -Cycle 11 FOLFOX 01/31/2021, oxaliplatin  held secondary to neuropathy symptoms -Cycle 12 FOLFOX 02/15/2021, oxaliplatin  held secondary to neuropathy -CT abdomen/pelvis 04/02/2021-no evidence of recurrent colon cancer -CT chest 04/08/2021-no evidence of metastatic disease -Elevated CEA February and March 2023 -08/17/2021 PET scan-no evidence of local recurrence or metastasis -09/26/2021-Guardant-ctDNA detected -Colonoscopy 10/04/2021-negative -CT 11/17/2021-no evidence of metastatic disease -CTs 01/08/2022-no evidence of metastatic disease -PET scan 08/10/2022-several newly enlarged lymph nodes with moderate metabolic activity -CT 11/06/2022-stable left supraclavicular and retroperitoneal nodes, mild progression of pelvic adenopathy, new 6 mm segment 2 liver lesion -CT 03/05/2023-increase size of left supraclavicular node and retroperitoneal nodes, increased size of gastropathic and left iliac side chain lymph nodes, stable subtle hypoattenuating lesions at segment 2 in the hepatic dome -CT abdomen/pelvis 05/07/2023-new intrahepatic and extrahepatic biliary duct  dilatation with possible associated periportal edema.  Common duct dilated up to 15 mm diameter,  abruptly terminates prior to entering the head of the pancreas.  Distended gallbladder with possible gallbladder wall thickening and pericholecystic edema.  No substantial change in lymphadenopathy of the gastrohepatic ligament, hepatoduodenal ligament and left external iliac chain. 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  Moderate diffuse intrahepatic and proximal extrahepatic biliary duct dilatation.  Mild gallbladder distention with moderate diffuse gallbladder wall thickening.  Minimal pericholecystic fluid and fat stranding.  Solitary 1.1 cm anterior segment 2 left liver mass.  Multiple mildly enlarged porta hepatis and portacaval nodes. -Biopsy perihepatic lymph node 05/10/2023-malignant cells, adenocarcinoma -CTs 07/02/2023-persistent biliary duct dilatation with indwelling stent.  Persistent wall thickening of the gallbladder.  Subtle areas of nodal enlargement identified in the abdomen and pelvis, similar to recent prior examinations.  Enlarged node in the supraclavicular region appears smaller.  No new thoracic nodes.  Mild areas of groundglass in the upper lobes of the lungs bilaterally, nonspecific. 2.  Anemia due to GI bleeding, iron deficiency?,  Renal insufficiency? 3.  New onset acute diastolic CHF March 2022 4.  Diabetes mellitus 5.  Renal insufficiency 6.  Hypertension 7.  History of left transmetatarsal amputation 8.  History of colon polyps 9.  Neuropathy 10.  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  Admission with obstructive jaundice 05/07/2023 - 05/22/2023.  Upper EUS 05/10/2023-lymph node visualized and measured in the porta hepatis region; FNA showed  malignant cells, adenocarcinoma.  ERCP 05/10/2023-biliary tract obstruction secondary to a mass in the common bile duct, plastic stent placed into the common bile duct.  ERCP 05/18/2023-visibly patent stent from the common bile duct was seen in the major papilla.  Stent removed from the common bile duct.  2 plastic stents placed into the common bile duct.    Disposition: Mr. Barnaby appears unchanged.  He has seen nephrology.  No indication to begin dialysis at present but he will need to begin at some point.  He will be meeting with an educator next week for more information.  He indicates his next appointment with the nephrologist is 11/05/2023.  He does not want to begin chemotherapy until a definite plan is in place regarding the renal failure.  We are canceling treatment today.  Plan for restaging noncontrast CTs in 2 to 3 weeks.  CBC and chemistry panel reviewed.  Overall stable.  He understands the anemia is likely related to renal failure.  He appears asymptomatic.  He will return for follow-up and possible chemotherapy 11/07/2023.  We are available to see him sooner if needed.  Patient seen with Dr. Scherrie Curt.    Diana Forster ANP/GNP-BC   10/08/2023  8:26 AM Mr. Cravens returns as scheduled.  He is now being followed by nephrology at the [facility] clinic for renal failure.  He has discussed dialysis with the nephrology team.  He reports diarrhea after certain foods.  It is unclear whether he has symptoms related to the metastatic colon cancer.  Mr. Renovato will remain on observation for colon cancer.  He will return for an office visit after he sees the nephrologist.  We will decide on continuing observation versus a trial of FOLFIRI.  Anise Kerns, MD

## 2023-10-10 ENCOUNTER — Encounter

## 2023-10-13 ENCOUNTER — Ambulatory Visit (HOSPITAL_BASED_OUTPATIENT_CLINIC_OR_DEPARTMENT_OTHER)
Admission: RE | Admit: 2023-10-13 | Discharge: 2023-10-13 | Disposition: A | Discharge status: Home / Self Care | Source: Ambulatory Visit | Attending: Nurse Practitioner | Admitting: Nurse Practitioner

## 2023-10-13 DIAGNOSIS — C185 Malignant neoplasm of splenic flexure: Secondary | ICD-10-CM | POA: Diagnosis present

## 2023-10-19 ENCOUNTER — Ambulatory Visit: Payer: Self-pay | Admitting: Oncology

## 2023-10-19 NOTE — Telephone Encounter (Signed)
 Called patient's wife Mrs. Jafri and made aware that CTs show small lymph nodes in the abdomen and pelvis, no other evidence of cancer, f/u as scheduled

## 2023-10-22 ENCOUNTER — Ambulatory Visit: Admitting: Nurse Practitioner

## 2023-10-22 ENCOUNTER — Other Ambulatory Visit

## 2023-10-22 ENCOUNTER — Ambulatory Visit

## 2023-10-22 ENCOUNTER — Ambulatory Visit: Admitting: Oncology

## 2023-10-24 ENCOUNTER — Encounter

## 2023-10-31 ENCOUNTER — Telehealth: Payer: Self-pay | Admitting: Oncology

## 2023-10-31 NOTE — Telephone Encounter (Signed)
 Patient has been scheduled for follow-up visit per 10/31/23 LOS.  Pt aware of scheduled appt details.

## 2023-11-02 ENCOUNTER — Other Ambulatory Visit: Payer: Self-pay

## 2023-11-05 ENCOUNTER — Ambulatory Visit: Admitting: Nurse Practitioner

## 2023-11-05 ENCOUNTER — Other Ambulatory Visit

## 2023-11-05 ENCOUNTER — Ambulatory Visit

## 2023-11-06 ENCOUNTER — Telehealth: Payer: Self-pay

## 2023-11-06 NOTE — Telephone Encounter (Signed)
 Spoke with patient's wife and informed her that patient's Lab and Port Flush w/Lab appointment for tomorrow 11/07/2023 has been rescheduled to 0930, she verbalized agreement.

## 2023-11-07 ENCOUNTER — Other Ambulatory Visit: Payer: Self-pay | Admitting: *Deleted

## 2023-11-07 ENCOUNTER — Ambulatory Visit

## 2023-11-07 ENCOUNTER — Inpatient Hospital Stay: Attending: Oncology

## 2023-11-07 ENCOUNTER — Other Ambulatory Visit

## 2023-11-07 ENCOUNTER — Ambulatory Visit: Admitting: Nurse Practitioner

## 2023-11-07 ENCOUNTER — Encounter: Payer: Self-pay | Admitting: *Deleted

## 2023-11-07 ENCOUNTER — Encounter: Payer: Self-pay | Admitting: Oncology

## 2023-11-07 ENCOUNTER — Encounter

## 2023-11-07 ENCOUNTER — Inpatient Hospital Stay (HOSPITAL_BASED_OUTPATIENT_CLINIC_OR_DEPARTMENT_OTHER): Admitting: Oncology

## 2023-11-07 ENCOUNTER — Inpatient Hospital Stay

## 2023-11-07 ENCOUNTER — Other Ambulatory Visit: Payer: Self-pay | Admitting: Gastroenterology

## 2023-11-07 VITALS — BP 102/61 | HR 60 | Temp 98.1°F | Resp 18 | Ht 73.0 in | Wt 194.7 lb

## 2023-11-07 DIAGNOSIS — E119 Type 2 diabetes mellitus without complications: Secondary | ICD-10-CM | POA: Insufficient documentation

## 2023-11-07 DIAGNOSIS — D649 Anemia, unspecified: Secondary | ICD-10-CM

## 2023-11-07 DIAGNOSIS — R5381 Other malaise: Secondary | ICD-10-CM | POA: Diagnosis not present

## 2023-11-07 DIAGNOSIS — Z452 Encounter for adjustment and management of vascular access device: Secondary | ICD-10-CM | POA: Insufficient documentation

## 2023-11-07 DIAGNOSIS — I5032 Chronic diastolic (congestive) heart failure: Secondary | ICD-10-CM | POA: Diagnosis not present

## 2023-11-07 DIAGNOSIS — I110 Hypertensive heart disease with heart failure: Secondary | ICD-10-CM | POA: Insufficient documentation

## 2023-11-07 DIAGNOSIS — C189 Malignant neoplasm of colon, unspecified: Secondary | ICD-10-CM | POA: Diagnosis not present

## 2023-11-07 DIAGNOSIS — R112 Nausea with vomiting, unspecified: Secondary | ICD-10-CM | POA: Insufficient documentation

## 2023-11-07 DIAGNOSIS — C186 Malignant neoplasm of descending colon: Secondary | ICD-10-CM | POA: Diagnosis present

## 2023-11-07 DIAGNOSIS — C185 Malignant neoplasm of splenic flexure: Secondary | ICD-10-CM

## 2023-11-07 DIAGNOSIS — Z95828 Presence of other vascular implants and grafts: Secondary | ICD-10-CM

## 2023-11-07 DIAGNOSIS — N19 Unspecified kidney failure: Secondary | ICD-10-CM | POA: Insufficient documentation

## 2023-11-07 LAB — CMP (CANCER CENTER ONLY)
ALT: 8 U/L (ref 0–44)
AST: 10 U/L — ABNORMAL LOW (ref 15–41)
Albumin: 3.3 g/dL — ABNORMAL LOW (ref 3.5–5.0)
Alkaline Phosphatase: 183 U/L — ABNORMAL HIGH (ref 38–126)
Anion gap: 14 (ref 5–15)
BUN: 50 mg/dL — ABNORMAL HIGH (ref 6–20)
CO2: 17 mmol/L — ABNORMAL LOW (ref 22–32)
Calcium: 8.7 mg/dL — ABNORMAL LOW (ref 8.9–10.3)
Chloride: 103 mmol/L (ref 98–111)
Creatinine: 6.79 mg/dL — ABNORMAL HIGH (ref 0.61–1.24)
GFR, Estimated: 9 mL/min — ABNORMAL LOW (ref >60)
Glucose, Bld: 107 mg/dL — ABNORMAL HIGH (ref 70–99)
Potassium: 4.8 mmol/L (ref 3.5–5.1)
Sodium: 133 mmol/L — ABNORMAL LOW (ref 135–145)
Total Bilirubin: 0.5 mg/dL (ref 0.0–1.2)
Total Protein: 6.8 g/dL (ref 6.5–8.1)

## 2023-11-07 LAB — CBC WITH DIFFERENTIAL (CANCER CENTER ONLY)
Abs Immature Granulocytes: 0.02 10*3/uL (ref 0.00–0.07)
Basophils Absolute: 0.1 10*3/uL (ref 0.0–0.1)
Basophils Relative: 1 %
Eosinophils Absolute: 0.3 10*3/uL (ref 0.0–0.5)
Eosinophils Relative: 5 %
HCT: 21.4 % — ABNORMAL LOW (ref 39.0–52.0)
Hemoglobin: 7 g/dL — ABNORMAL LOW (ref 13.0–17.0)
Immature Granulocytes: 0 %
Lymphocytes Relative: 16 %
Lymphs Abs: 1 10*3/uL (ref 0.7–4.0)
MCH: 30.2 pg (ref 26.0–34.0)
MCHC: 32.7 g/dL (ref 30.0–36.0)
MCV: 92.2 fL (ref 80.0–100.0)
Monocytes Absolute: 0.6 10*3/uL (ref 0.1–1.0)
Monocytes Relative: 10 %
Neutro Abs: 4.2 10*3/uL (ref 1.7–7.7)
Neutrophils Relative %: 68 %
Platelet Count: 210 10*3/uL (ref 150–400)
RBC: 2.32 MIL/uL — ABNORMAL LOW (ref 4.22–5.81)
RDW: 13.2 % (ref 11.5–15.5)
WBC Count: 6.1 10*3/uL (ref 4.0–10.5)
nRBC: 0 % (ref 0.0–0.2)

## 2023-11-07 LAB — ABO/RH: ABO/RH(D): A POS

## 2023-11-07 LAB — SAMPLE TO BLOOD BANK

## 2023-11-07 LAB — FERRITIN: Ferritin: 280 ng/mL (ref 24–336)

## 2023-11-07 LAB — PREPARE RBC (CROSSMATCH)

## 2023-11-07 MED ORDER — SODIUM CHLORIDE 0.9% FLUSH
10.0000 mL | Freq: Once | INTRAVENOUS | Status: AC
  Administered 2023-11-07: 10 mL via INTRAVENOUS

## 2023-11-07 MED ORDER — SODIUM CHLORIDE 0.9% IV SOLUTION
250.0000 mL | INTRAVENOUS | Status: DC
  Administered 2023-11-07: 250 mL via INTRAVENOUS

## 2023-11-07 MED ORDER — HEPARIN SOD (PORK) LOCK FLUSH 100 UNIT/ML IV SOLN
500.0000 [IU] | Freq: Every day | INTRAVENOUS | Status: AC | PRN
  Administered 2023-11-07: 500 [IU]

## 2023-11-07 MED ORDER — SODIUM CHLORIDE 0.9% FLUSH
10.0000 mL | INTRAVENOUS | Status: AC | PRN
  Administered 2023-11-07: 10 mL

## 2023-11-07 NOTE — Progress Notes (Signed)
 Called the [place] and spoke with Ellene Gustin RN who is covering for Emanuel Handy this week. Discussed that Mr Dubs wants to proceed with dialysis. Informed her that he is receiving a blood transfusion here today for hgb of 7.0 and also that Dr Scherrie Curt will hold his chemotherapy until after his dialysis is established.    She will keep our team informed of dialysis plan

## 2023-11-07 NOTE — Addendum Note (Signed)
 Addended by: Coni Deep B on: 11/07/2023 11:59 AM   Modules accepted: Orders

## 2023-11-07 NOTE — Progress Notes (Signed)
 Jeffrey Young declines seeing any blood in his stool or dark black stools. Says his stool looks normal

## 2023-11-07 NOTE — Patient Instructions (Signed)

## 2023-11-07 NOTE — Progress Notes (Signed)
 [facility] OFFICE PROGRESS NOTE   Diagnosis: Colon cancer  INTERVAL HISTORY:   Mr. Yohn returns as scheduled.  He has been evaluated at the [facility] nephrology clinic each of the past 2 weeks.  He has decided to proceed with hemodialysis. He reports malaise.  The foot ulcer is healing.  He has intermittent nausea.  He is having bowel movements.  No pain.  No dyspnea.  Objective:  Vital signs in last 24 hours:  Blood pressure 102/61, pulse 60, temperature 98.1 F (36.7 C), temperature source Temporal, resp. rate 18, height 6' 1 (1.854 m), weight 194 lb 11.2 oz (88.3 kg), SpO2 100%.  Resp: Lungs clear bilaterally Cardio: Regular rate and rhythm GI: Nontender, soft, no hepatosplenomegaly, firm 3-4 cm area of masslike fullness at the right upper abdomen Vascular: No leg edema  Portacath/PICC-without erythema  Lab Results:  Lab Results  Component Value Date   WBC 6.1 11/07/2023   HGB 7.0 (L) 11/07/2023   HCT 21.4 (L) 11/07/2023   MCV 92.2 11/07/2023   PLT 210 11/07/2023   NEUTROABS 4.2 11/07/2023    CMP  Lab Results  Component Value Date   NA 137 10/08/2023   K 4.0 10/08/2023   CL 103 10/08/2023   CO2 21 (L) 10/08/2023   GLUCOSE 109 (H) 10/08/2023   BUN 44 (H) 10/08/2023   CREATININE 5.73 (H) 10/08/2023   CALCIUM  7.9 (L) 10/08/2023   PROT 7.0 10/08/2023   ALBUMIN  3.2 (L) 10/08/2023   AST 11 (L) 10/08/2023   ALT 7 10/08/2023   ALKPHOS 163 (H) 10/08/2023   BILITOT 0.3 10/08/2023   GFRNONAA 11 (L) 10/08/2023   GFRAA >60 01/26/2018    Lab Results  Component Value Date   CEA1 11.44 (H) 09/27/2020   CEA 102.17 (H) 10/08/2023    Medications: I have reviewed the patient's current medications.   Assessment/Plan:  Descending colon cancer, stage IIIc (T3N2b M0), status post a partial left colectomy 07/30/2020, 9/16 lymph nodes positive, lymphovascular invasion, 1 satellite nodule, negative margins, MSS, no loss of mismatch repair protein expression;  foundation 1 K-ras wild-type, NRAS Q61H, microsatellite stable, tumor mutation burden 4. -History of large polyp in the left side of the colon-referred to [facility] in 05/2018 for procedure canceled secondary to COVID-19 pandemic.  Procedure was not rescheduled. -CT chest/abdomen/pelvis with contrast 07/27/2020-3 small pulmonary nodules less than 5 mm favored to be benign, circumferential luminal narrowing of the distal transverse colon concerning for malignancy, no metastatic adenopathy in the mesentery porta hepatis, no for metastasis. -CEA on 07/27/2020 was 17.3; 37 on 08/30/2020; 33 on 09/13/2020 -Colonoscopy performed 07/27/2020 showed a fungating, infiltrative and ulcerated nonobstructing large mass in the proximal descending colon.  Biopsy-adenocarcinoma -Cycle 1 FOLFOX 08/30/2020 -Cycle 2 FOLFOX 09/13/2020, Emend  added for delayed nausea -Cycle 3 FOLFOX 09/27/2020 -Cycle 4 FOLFOX 10/11/2020 -Cycle 5 FOLFOX 11/08/2020 -Cycle 6 FOLFOX 11/23/2020 -CT 12/03/2020-prior 3 mm left apical nodule no longer seen, no new/suspicious pulmonary nodules, no evidence of metastatic disease -Cycle 7 FOLFOX 12/06/2020 -Cycle 8 FOLFOX 12/21/2020 -Cycle 9 FOLFOX 01/03/2021 -Cycle 10 FOLFOX 01/17/2021 -Cycle 11 FOLFOX 01/31/2021, oxaliplatin  held secondary to neuropathy symptoms -Cycle 12 FOLFOX 02/15/2021, oxaliplatin  held secondary to neuropathy -CT abdomen/pelvis 04/02/2021-no evidence of recurrent colon cancer -CT chest 04/08/2021-no evidence of metastatic disease -Elevated CEA February and March 2023 -08/17/2021 PET scan-no evidence of local recurrence or metastasis -09/26/2021-Guardant-ctDNA detected -Colonoscopy 10/04/2021-negative -CT 11/17/2021-no evidence of metastatic disease -CTs 01/08/2022-no evidence of metastatic disease -PET scan 08/10/2022-several newly enlarged lymph  nodes with moderate metabolic activity -CT 11/06/2022-stable left supraclavicular and retroperitoneal nodes, mild progression of pelvic adenopathy, new 6 mm  segment 2 liver lesion -CT 03/05/2023-increase size of left supraclavicular node and retroperitoneal nodes, increased size of gastropathic and left iliac side chain lymph nodes, stable subtle hypoattenuating lesions at segment 2 in the hepatic dome -CT abdomen/pelvis 05/07/2023-new intrahepatic and extrahepatic biliary duct dilatation with possible associated periportal edema.  Common duct dilated up to 15 mm diameter, abruptly terminates prior to entering the head of the pancreas.  Distended gallbladder with possible gallbladder wall thickening and pericholecystic edema.  No substantial change in lymphadenopathy of the gastrohepatic ligament, hepatoduodenal ligament and left external iliac chain. 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  Moderate diffuse intrahepatic and proximal extrahepatic biliary duct dilatation.  Mild gallbladder distention with moderate diffuse gallbladder wall thickening.  Minimal pericholecystic fluid and fat stranding.  Solitary 1.1 cm anterior segment 2 left liver mass.  Multiple mildly enlarged porta hepatis and portacaval nodes. -Biopsy perihepatic lymph node 05/10/2023-malignant cells, adenocarcinoma -CTs 07/02/2023-persistent biliary duct dilatation with indwelling stent.  Persistent wall thickening of the gallbladder.  Subtle areas of nodal enlargement identified in the abdomen and pelvis, similar to recent prior examinations.  Enlarged node in the supraclavicular region appears smaller.  No new thoracic nodes.  Mild areas of groundglass in the upper lobes of the lungs bilaterally, nonspecific. 2.  Anemia due to GI bleeding, iron deficiency?,  Renal insufficiency? 3.  New onset acute diastolic CHF March 2022 4.  Diabetes mellitus 5.  Renal insufficiency 6.  Hypertension 7.  History of left transmetatarsal amputation 8.  History of colon polyps 9.   Neuropathy 10.  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  Admission with obstructive jaundice 05/07/2023 - 05/22/2023.  Upper EUS 05/10/2023-lymph node visualized and measured in the porta hepatis region; FNA showed malignant cells, adenocarcinoma.  ERCP 05/10/2023-biliary tract obstruction secondary to a mass in the common bile duct, plastic stent placed into the common bile duct.  ERCP 05/18/2023-visibly patent stent from the common bile duct was seen in the major papilla.  Stent removed from the common bile duct.  2 plastic stents placed into the common bile duct.    Disposition: Mr. Kerner has metastatic colon cancer.  He does not appear symptomatic from the colon cancer at present, though it is possible his nausea is related to colon cancer.  He had an episode of emesis in the office today.  It is more likely the nausea is related to renal failure.  He reports intermittent episodes of nausea.  He is having bowel movements. He has increased malaise, likely secondary to severe anemia.  Anemia is most likely due to renal failure and the anemia of chronic disease.  There is no report of GI bleeding, but he has a history of GI bleeding in the past.  He will be transfused with 2 units of packed red blood cells today.  Mr. Jost has decided to proceed with hemodialysis.  We will hold chemotherapy until after he has started a dialysis regimen.  We will coordinate future chemotherapy with his dialysis schedule.  He will return for an office and lab visit in 2 weeks.  Coni Deep, MD  11/07/2023  10:28 AM

## 2023-11-07 NOTE — Patient Instructions (Signed)

## 2023-11-08 ENCOUNTER — Inpatient Hospital Stay

## 2023-11-08 ENCOUNTER — Other Ambulatory Visit: Payer: Self-pay

## 2023-11-08 DIAGNOSIS — Z452 Encounter for adjustment and management of vascular access device: Secondary | ICD-10-CM | POA: Diagnosis not present

## 2023-11-08 DIAGNOSIS — D649 Anemia, unspecified: Secondary | ICD-10-CM

## 2023-11-08 DIAGNOSIS — C189 Malignant neoplasm of colon, unspecified: Secondary | ICD-10-CM

## 2023-11-08 MED ORDER — HEPARIN SOD (PORK) LOCK FLUSH 100 UNIT/ML IV SOLN
500.0000 [IU] | Freq: Every day | INTRAVENOUS | Status: AC | PRN
  Administered 2023-11-08: 500 [IU]

## 2023-11-08 MED ORDER — SODIUM CHLORIDE 0.9% FLUSH
10.0000 mL | INTRAVENOUS | Status: AC | PRN
  Administered 2023-11-08: 10 mL

## 2023-11-08 MED ORDER — SODIUM CHLORIDE 0.9% IV SOLUTION
250.0000 mL | INTRAVENOUS | Status: DC
  Administered 2023-11-08: 250 mL via INTRAVENOUS

## 2023-11-08 NOTE — Patient Instructions (Signed)

## 2023-11-09 ENCOUNTER — Encounter

## 2023-11-09 LAB — BPAM RBC
Blood Product Expiration Date: 202507142359
Blood Product Expiration Date: 202507142359
ISSUE DATE / TIME: 202506181243
ISSUE DATE / TIME: 202506190734
Unit Type and Rh: 6200
Unit Type and Rh: 6200

## 2023-11-09 LAB — TYPE AND SCREEN
ABO/RH(D): A POS
Antibody Screen: NEGATIVE
Unit division: 0
Unit division: 0

## 2023-11-17 ENCOUNTER — Other Ambulatory Visit: Payer: Self-pay | Admitting: Oncology

## 2023-11-19 ENCOUNTER — Encounter: Payer: Self-pay | Admitting: Oncology

## 2023-11-19 ENCOUNTER — Ambulatory Visit

## 2023-11-19 ENCOUNTER — Inpatient Hospital Stay: Admitting: Nurse Practitioner

## 2023-11-19 ENCOUNTER — Ambulatory Visit: Admitting: Oncology

## 2023-11-19 ENCOUNTER — Inpatient Hospital Stay

## 2023-11-19 ENCOUNTER — Ambulatory Visit (HOSPITAL_COMMUNITY)
Admission: RE | Admit: 2023-11-19 | Discharge: 2023-11-19 | Disposition: A | Discharge status: Home / Self Care | Source: Ambulatory Visit | Attending: Vascular Surgery | Admitting: Vascular Surgery

## 2023-11-19 ENCOUNTER — Other Ambulatory Visit: Payer: Self-pay

## 2023-11-19 ENCOUNTER — Encounter (HOSPITAL_COMMUNITY)
Admission: RE | Disposition: A | Discharge status: Home / Self Care | Payer: Self-pay | Source: Ambulatory Visit | Attending: Vascular Surgery

## 2023-11-19 ENCOUNTER — Encounter (HOSPITAL_COMMUNITY): Payer: Self-pay | Admitting: Vascular Surgery

## 2023-11-19 ENCOUNTER — Other Ambulatory Visit

## 2023-11-19 DIAGNOSIS — Z794 Long term (current) use of insulin: Secondary | ICD-10-CM | POA: Insufficient documentation

## 2023-11-19 DIAGNOSIS — N186 End stage renal disease: Secondary | ICD-10-CM | POA: Diagnosis present

## 2023-11-19 DIAGNOSIS — N184 Chronic kidney disease, stage 4 (severe): Secondary | ICD-10-CM

## 2023-11-19 DIAGNOSIS — E1122 Type 2 diabetes mellitus with diabetic chronic kidney disease: Secondary | ICD-10-CM | POA: Diagnosis not present

## 2023-11-19 DIAGNOSIS — I5020 Unspecified systolic (congestive) heart failure: Secondary | ICD-10-CM | POA: Insufficient documentation

## 2023-11-19 HISTORY — PX: DIALYSIS/PERMA CATHETER INSERTION: CATH118288

## 2023-11-19 LAB — GLUCOSE, CAPILLARY: Glucose-Capillary: 88 mg/dL (ref 70–99)

## 2023-11-19 SURGERY — DIALYSIS/PERMA CATHETER INSERTION
Anesthesia: LOCAL | Site: Chest

## 2023-11-19 MED ORDER — HEPARIN SODIUM (PORCINE) 1000 UNIT/ML IJ SOLN
INTRAMUSCULAR | Status: AC
  Filled 2023-11-19: qty 10

## 2023-11-19 MED ORDER — MIDAZOLAM HCL 2 MG/2ML IJ SOLN
INTRAMUSCULAR | Status: DC | PRN
  Administered 2023-11-19 (×2): 1 mg via INTRAVENOUS

## 2023-11-19 MED ORDER — HEPARIN (PORCINE) IN NACL 1000-0.9 UT/500ML-% IV SOLN
INTRAVENOUS | Status: DC | PRN
  Administered 2023-11-19: 500 mL

## 2023-11-19 MED ORDER — HEPARIN SODIUM (PORCINE) 1000 UNIT/ML IJ SOLN
INTRAMUSCULAR | Status: DC | PRN
Start: 2023-11-19 — End: 2023-11-19
  Administered 2023-11-19 (×2): 1.6 [IU] via INTRAVENOUS

## 2023-11-19 MED ORDER — FENTANYL CITRATE (PF) 100 MCG/2ML IJ SOLN
INTRAMUSCULAR | Status: AC
Start: 2023-11-19 — End: 2023-11-19
  Filled 2023-11-19: qty 2

## 2023-11-19 MED ORDER — LIDOCAINE HCL (PF) 1 % IJ SOLN
INTRAMUSCULAR | Status: AC
  Filled 2023-11-19: qty 30

## 2023-11-19 MED ORDER — LIDOCAINE HCL (PF) 1 % IJ SOLN
INTRAMUSCULAR | Status: DC | PRN
  Administered 2023-11-19: 10 mL
  Administered 2023-11-19: 15 mL

## 2023-11-19 MED ORDER — FENTANYL CITRATE (PF) 100 MCG/2ML IJ SOLN
INTRAMUSCULAR | Status: DC | PRN
  Administered 2023-11-19: 25 ug via INTRAVENOUS
  Administered 2023-11-19: 50 ug via INTRAVENOUS

## 2023-11-19 MED ORDER — MIDAZOLAM HCL 2 MG/2ML IJ SOLN
INTRAMUSCULAR | Status: AC
  Filled 2023-11-19: qty 2

## 2023-11-19 SURGICAL SUPPLY — 4 items
CATH PALINDROME-P 19CM W/VT (CATHETERS) IMPLANT
KIT MICROPUNCTURE NIT STIFF (SHEATH) IMPLANT
SHEATH PROBE COVER 6X72 (BAG) IMPLANT
TRAY PV CATH (CUSTOM PROCEDURE TRAY) ×1 IMPLANT

## 2023-11-19 NOTE — H&P (Signed)
 [facility] [place]  ASSESSMENT / PLAN: 58 y.o. male with new diagnosis of end-stage renal disease.  He is scheduled to start dialysis on Wednesday.  He is referred to the outpatient dialysis center for placement of a tunneled dialysis catheter.  I reviewed the risk, benefits, and alternatives to this procedure.  Will plan to proceed with right arm permanent dialysis access on a nondialysis day in the near future.  CHIEF COMPLAINT: New diagnosis of end-stage renal disease  HISTORY OF PRESENT ILLNESS: Jeffrey Young is a 58 y.o. male referred to clinic for evaluation of new diagnosis of end-stage renal disease.  The patient has had slowly deteriorating renal function.  Last check showed a GFR of 10.  He is scheduled start dialysis on Wednesday.  His nephrologist has requested we place a tunneled dialysis catheter.  I reviewed the risk profile of this procedure.  I counseled him that dialysis access should be pursued as well.  He is understanding and wishes to proceed.  He is left-handed.  Is never had permanent dialysis access before.  He has a left-sided port for chemotherapy. Past Medical History:  Diagnosis Date   Arthritis    Hip   Colon cancer (HCC)    Diabetic mononeuropathy associated with type 2 diabetes mellitus (HCC) 03/21/2017   Diabetic retinopathy (HCC) 07/31/2020   Enthesopathy of ankle and tarsus 04/02/2009   Formatting of this note might be different from the original. 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 Type 2 Diabetes Mellitus - Uncomplicated, Uncontrolled    Past Surgical History:  Procedure Laterality Date   AMPUTATION Left 01/22/2018   Procedure: TRANSMETATARSAL AMPUTATION;  Surgeon: Harden Jerona GAILS, MD;  Location: [facility] OR;  Service: Orthopedics;  Laterality: Left;toes   BILIARY STENT PLACEMENT N/A 05/10/2023   Procedure: BILIARY STENT PLACEMENT;  Surgeon: Rollin Dover, MD;  Location: [facility];  Service: Gastroenterology;  Laterality: N/A;   BILIARY STENT PLACEMENT N/A 05/18/2023   Procedure: BILIARY STENT PLACEMENT;  Surgeon: Rollin Dover, MD;  Location: [facility];  Service: Gastroenterology;  Laterality: N/A;   BIOPSY  07/27/2020   Procedure: BIOPSY;  Surgeon: Rollin Dover, MD;  Location: [facility];  Service: Endoscopy;;   COLON RESECTION N/A 07/30/2020   Procedure: HAND ASSISTED LAPAROSCOPIC LEFT HEMI COLECTOMY;  Surgeon: Gladis Cough, MD;  Location: [facility];  Service: General;  Laterality: N/A;   COLONOSCOPY WITH PROPOFOL  N/A 05/24/2018   Procedure: COLONOSCOPY WITH PROPOFOL ;  Surgeon: Rollin Dover, MD;  Location: [facility];  Service: Endoscopy;  Laterality: N/A;   COLONOSCOPY WITH PROPOFOL  N/A 07/27/2020   Procedure: COLONOSCOPY WITH PROPOFOL ;  Surgeon: Rollin Dover, MD;  Location: [facility];  Service: Endoscopy;  Laterality: N/A;   ERCP N/A 05/10/2023   Procedure: ENDOSCOPIC RETROGRADE CHOLANGIOPANCREATOGRAPHY (ERCP);  Surgeon: Rollin Dover, MD;  Location: [facility];  Service: Gastroenterology;  Laterality: N/A;   ERCP N/A  05/18/2023   Procedure: ENDOSCOPIC RETROGRADE CHOLANGIOPANCREATOGRAPHY (ERCP);  Surgeon: Rollin Dover, MD;  Location: [facility];  Service: Gastroenterology;  Laterality: N/A;   ESOPHAGOGASTRODUODENOSCOPY Left 04/05/2021   Procedure:  ESOPHAGOGASTRODUODENOSCOPY (EGD);  Surgeon: Rollin Dover, MD;  Location: [facility];  Service: Endoscopy;  Laterality: Left;   ESOPHAGOGASTRODUODENOSCOPY N/A 05/10/2023   Procedure: ESOPHAGOGASTRODUODENOSCOPY (EGD);  Surgeon: Rollin Dover, MD;  Location: [facility];  Service: Gastroenterology;  Laterality: N/A;   EUS N/A 05/10/2023   Procedure: UPPER ENDOSCOPIC ULTRASOUND (EUS) LINEAR;  Surgeon: Rollin Dover, MD;  Location: [facility];  Service: Gastroenterology;  Laterality: N/A;   FINE NEEDLE ASPIRATION N/A 05/10/2023   Procedure: FINE NEEDLE ASPIRATION (FNA) LINEAR;  Surgeon: Rollin Dover, MD;  Location: [facility];  Service: Gastroenterology;  Laterality: N/A;   POLYPECTOMY  05/24/2018   Procedure: POLYPECTOMY;  Surgeon: Rollin Dover, MD;  Location: [facility];  Service: Endoscopy;;   PORTACATH PLACEMENT Left 08/24/2020   Procedure: INSERTION PORT-A-CATH;  Surgeon: Gladis Cough, MD;  Location: [facility];  Service: General;  Laterality: Left;  75/rm1   SPHINCTEROTOMY  05/10/2023   Procedure: ANNETT;  Surgeon: Rollin Dover, MD;  Location: [facility];  Service: Gastroenterology;;   ANNETT  05/18/2023   Procedure: ANNETT;  Surgeon: Rollin Dover, MD;  Location: [facility];  Service: Gastroenterology;;   CLEDA REMOVAL  05/18/2023   Procedure: STENT REMOVAL;  Surgeon: Rollin Dover, MD;  Location: [facility];  Service: Gastroenterology;;   SUBMUCOSAL TATTOO INJECTION  07/27/2020   Procedure: SUBMUCOSAL TATTOO INJECTION;  Surgeon: Rollin Dover, MD;  Location: [facility];  Service: Endoscopy;;    Family History  Problem Relation Age of Onset   Hypertension Father     Social History   Socioeconomic History   Marital status: Married    Spouse name: Not on file   Number of children: Not on file   Years of education: Not on file   Highest education level: Not on file  Occupational History   Not on file  Tobacco Use   Smoking status: Never    Smokeless tobacco: Never  Vaping Use   Vaping status: Never Used  Substance and Sexual Activity   Alcohol use: Yes    Alcohol/week: 1.0 standard drink of alcohol    Types: 1 Cans of beer per week    Comment: occasional   Drug use: Never   Sexual activity: Yes  Other Topics Concern   Not on file  Social History Narrative   Not on file   Social Drivers of Health   Financial Resource Strain: Low Risk  (05/30/2023)   Received from [facility]   Overall Financial Resource Strain (CARDIA)    Difficulty of Paying Living Expenses: Not hard at all  Food Insecurity: No Food Insecurity (05/30/2023)   Received from [facility]   Hunger Vital Sign    Within the past 12 months, you worried that your food would run out before you got the money to buy more.: Never true    Within the past 12 months, the food you bought just didn't last and you didn't have money to get more.: Never true  Transportation Needs: No Transportation Needs (05/30/2023)   Received from [facility] - Transportation    Lack of Transportation (Medical): No    Lack of Transportation (Non-Medical): No  Physical Activity: Inactive (12/20/2020)   Received from [facility]   Exercise Vital Sign    On average, how many days per week do you engage in moderate  to strenuous exercise (like a brisk walk)?: 0 days    On average, how many minutes do you engage in exercise at this level?: 0 min  Stress: No Stress Concern Present (12/20/2020)   Received from [facility] - Occupational Stress Questionnaire    Feeling of Stress : Not at all  Social Connections: Unknown (09/30/2021)   Received from [facility]   Social Network    Social Network: Not on file  Intimate Partner Violence: Not At Risk (05/07/2023)   Humiliation, Afraid, Rape, and Kick questionnaire    Fear of Current or Ex-Partner: No    Emotionally Abused: No    Physically Abused: No    Sexually Abused: No     Allergies  Allergen Reactions   Bee Venom Anaphylaxis, Swelling and Other (See Comments)    Cold Sweats, also   Latex Rash    Current Facility-Administered Medications  Medication Dose Route Frequency Provider Last Rate Last Admin   fentaNYL  (SUBLIMAZE ) injection    PRN [name] [name], MD   25 mcg at 11/19/23 1019   Heparin  (Porcine) in NaCl 1000-0.9 UT/500ML-% SOLN    PRN [name] [name], MD   500 mL at 11/19/23 1030   heparin  sodium (porcine) injection    PRN [name] [name], MD   1.6 Units at 11/19/23 1031   lidocaine  (PF) (XYLOCAINE ) 1 % injection    PRN [name] [name], MD   10 mL at 11/19/23 1026   midazolam  (VERSED ) injection    PRN [name] [name], MD   1 mg at 11/19/23 1019   Facility-Administered Medications Ordered in Other Encounters  Medication Dose Route Frequency Provider Last Rate Last Admin   sodium chloride  flush (NS) 0.9 % injection 10 mL  10 mL Intracatheter PRN Cloretta Arley NOVAK, MD   10 mL at 11/17/21 1153    PHYSICAL EXAM Vitals:   11/19/23 0823 11/19/23 0844  BP:  (!) 172/83  Pulse: 63 61  Resp: 12 12  Temp: 97.8 F (36.6 C)   TempSrc: Oral   SpO2: 100% 100%   Elderly man in no distress Regular rate and rhythm Unlabored breathing 2+ radial pulses bilaterally Port in left chest   PERTINENT LABORATORY AND RADIOLOGIC DATA  Most recent CBC    Latest Ref Rng & Units 11/07/2023   10:05 AM 10/08/2023    8:10 AM 09/11/2023    9:23 AM  CBC  WBC 4.0 - 10.5 K/uL 6.1  6.3  6.9   Hemoglobin 13.0 - 17.0 g/dL 7.0  7.9  8.3   Hematocrit 39.0 - 52.0 % 21.4  23.7  25.3   Platelets 150 - 400 K/uL 210  208  195      Most recent CMP    Latest Ref Rng & Units 11/07/2023   10:05 AM 10/08/2023    8:10 AM 09/11/2023    9:23 AM  CMP  Glucose 70 - 99 mg/dL 892  890  882   BUN 6 - 20 mg/dL 50  44  43   Creatinine 0.61 - 1.24 mg/dL 3.20  4.26  3.33   Sodium 135 - 145 mmol/L 133  137  135   Potassium 3.5 - 5.1 mmol/L 4.8  4.0  4.3   Chloride 98 - 111  mmol/L 103  103  103   CO2 22 - 32 mmol/L 17  21  18    Calcium  8.9 - 10.3 mg/dL 8.7  7.9  7.7   Total Protein 6.5 - 8.1 g/dL 6.8  7.0  6.6   Total Bilirubin 0.0 - 1.2 mg/dL 0.5  0.3  0.4   Alkaline Phos 38 - 126 U/L 183  163  153   AST 15 - 41 U/L 10  11  12    ALT 0 - 44 U/L 8  7  8      Renal function Estimated Creatinine Clearance: 13.1 mL/min (A) (by C-G formula based on SCr of 6.79 mg/dL (H)).  Hgb A1c MFr Bld (%)  Date Value  05/08/2023 5.4    LDL Cholesterol  Date Value Ref Range Status  07/27/2020 94 0 - 99 mg/dL Final    Comment:           Total Cholesterol/HDL:CHD Risk Coronary Heart Disease Risk Table                     Men   Women  1/2 Average Risk   3.4   3.3  Average Risk       5.0   4.4  2 X Average Risk   9.6   7.1  3 X Average Risk  23.4   11.0        Use the calculated Patient Ratio above and the CHD Risk Table to determine the patient's CHD Risk.        ATP III CLASSIFICATION (LDL):  <100     mg/dL   Optimal  899-870  mg/dL   Near or Above                    Optimal  130-159  mg/dL   Borderline  839-810  mg/dL   High  >809     mg/dL   Very High Performed at [facility], [address], [place], [place] [address]     [name]. [name], MD FACS [facility] Phone Number: [phone] 11/19/2023 10:42 AM   Total time spent on preparing this encounter including chart review, data review, collecting history, examining the patient, and coordinating care: 45 minutes  Portions of this report may have been transcribed using voice recognition software.  Every effort has been made to ensure accuracy; however, inadvertent computerized transcription errors may still be present.

## 2023-11-19 NOTE — H&P (View-Only) (Signed)
 [facility] [place]  ASSESSMENT / PLAN: 58 y.o. male with new diagnosis of end-stage renal disease.  He is scheduled to start dialysis on Wednesday.  He is referred to the outpatient dialysis center for placement of a tunneled dialysis catheter.  I reviewed the risk, benefits, and alternatives to this procedure.  Will plan to proceed with right arm permanent dialysis access on a nondialysis day in the near future.  CHIEF COMPLAINT: New diagnosis of end-stage renal disease  HISTORY OF PRESENT ILLNESS: Jeffrey Young is a 58 y.o. male referred to clinic for evaluation of new diagnosis of end-stage renal disease.  The patient has had slowly deteriorating renal function.  Last check showed a GFR of 10.  He is scheduled start dialysis on Wednesday.  His nephrologist has requested we place a tunneled dialysis catheter.  I reviewed the risk profile of this procedure.  I counseled him that dialysis access should be pursued as well.  He is understanding and wishes to proceed.  He is left-handed.  Is never had permanent dialysis access before.  He has a left-sided port for chemotherapy. Past Medical History:  Diagnosis Date   Arthritis    Hip   Colon cancer (HCC)    Diabetic mononeuropathy associated with type 2 diabetes mellitus (HCC) 03/21/2017   Diabetic retinopathy (HCC) 07/31/2020   Enthesopathy of ankle and tarsus 04/02/2009   Formatting of this note might be different from the original. 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 Type 2 Diabetes Mellitus - Uncomplicated, Uncontrolled    Past Surgical History:  Procedure Laterality Date   AMPUTATION Left 01/22/2018   Procedure: TRANSMETATARSAL AMPUTATION;  Surgeon: Harden Jerona GAILS, MD;  Location: [facility] OR;  Service: Orthopedics;  Laterality: Left;toes   BILIARY STENT PLACEMENT N/A 05/10/2023   Procedure: BILIARY STENT PLACEMENT;  Surgeon: Rollin Dover, MD;  Location: [facility];  Service: Gastroenterology;  Laterality: N/A;   BILIARY STENT PLACEMENT N/A 05/18/2023   Procedure: BILIARY STENT PLACEMENT;  Surgeon: Rollin Dover, MD;  Location: [facility];  Service: Gastroenterology;  Laterality: N/A;   BIOPSY  07/27/2020   Procedure: BIOPSY;  Surgeon: Rollin Dover, MD;  Location: [facility];  Service: Endoscopy;;   COLON RESECTION N/A 07/30/2020   Procedure: HAND ASSISTED LAPAROSCOPIC LEFT HEMI COLECTOMY;  Surgeon: Gladis Cough, MD;  Location: [facility];  Service: General;  Laterality: N/A;   COLONOSCOPY WITH PROPOFOL  N/A 05/24/2018   Procedure: COLONOSCOPY WITH PROPOFOL ;  Surgeon: Rollin Dover, MD;  Location: [facility];  Service: Endoscopy;  Laterality: N/A;   COLONOSCOPY WITH PROPOFOL  N/A 07/27/2020   Procedure: COLONOSCOPY WITH PROPOFOL ;  Surgeon: Rollin Dover, MD;  Location: [facility];  Service: Endoscopy;  Laterality: N/A;   ERCP N/A 05/10/2023   Procedure: ENDOSCOPIC RETROGRADE CHOLANGIOPANCREATOGRAPHY (ERCP);  Surgeon: Rollin Dover, MD;  Location: [facility];  Service: Gastroenterology;  Laterality: N/A;   ERCP N/A  05/18/2023   Procedure: ENDOSCOPIC RETROGRADE CHOLANGIOPANCREATOGRAPHY (ERCP);  Surgeon: Rollin Dover, MD;  Location: [facility];  Service: Gastroenterology;  Laterality: N/A;   ESOPHAGOGASTRODUODENOSCOPY Left 04/05/2021   Procedure:  ESOPHAGOGASTRODUODENOSCOPY (EGD);  Surgeon: Rollin Dover, MD;  Location: [facility];  Service: Endoscopy;  Laterality: Left;   ESOPHAGOGASTRODUODENOSCOPY N/A 05/10/2023   Procedure: ESOPHAGOGASTRODUODENOSCOPY (EGD);  Surgeon: Rollin Dover, MD;  Location: [facility];  Service: Gastroenterology;  Laterality: N/A;   EUS N/A 05/10/2023   Procedure: UPPER ENDOSCOPIC ULTRASOUND (EUS) LINEAR;  Surgeon: Rollin Dover, MD;  Location: [facility];  Service: Gastroenterology;  Laterality: N/A;   FINE NEEDLE ASPIRATION N/A 05/10/2023   Procedure: FINE NEEDLE ASPIRATION (FNA) LINEAR;  Surgeon: Rollin Dover, MD;  Location: [facility];  Service: Gastroenterology;  Laterality: N/A;   POLYPECTOMY  05/24/2018   Procedure: POLYPECTOMY;  Surgeon: Rollin Dover, MD;  Location: [facility];  Service: Endoscopy;;   PORTACATH PLACEMENT Left 08/24/2020   Procedure: INSERTION PORT-A-CATH;  Surgeon: Gladis Cough, MD;  Location: [facility];  Service: General;  Laterality: Left;  75/rm1   SPHINCTEROTOMY  05/10/2023   Procedure: ANNETT;  Surgeon: Rollin Dover, MD;  Location: [facility];  Service: Gastroenterology;;   ANNETT  05/18/2023   Procedure: ANNETT;  Surgeon: Rollin Dover, MD;  Location: [facility];  Service: Gastroenterology;;   CLEDA REMOVAL  05/18/2023   Procedure: STENT REMOVAL;  Surgeon: Rollin Dover, MD;  Location: [facility];  Service: Gastroenterology;;   SUBMUCOSAL TATTOO INJECTION  07/27/2020   Procedure: SUBMUCOSAL TATTOO INJECTION;  Surgeon: Rollin Dover, MD;  Location: [facility];  Service: Endoscopy;;    Family History  Problem Relation Age of Onset   Hypertension Father     Social History   Socioeconomic History   Marital status: Married    Spouse name: Not on file   Number of children: Not on file   Years of education: Not on file   Highest education level: Not on file  Occupational History   Not on file  Tobacco Use   Smoking status: Never    Smokeless tobacco: Never  Vaping Use   Vaping status: Never Used  Substance and Sexual Activity   Alcohol use: Yes    Alcohol/week: 1.0 standard drink of alcohol    Types: 1 Cans of beer per week    Comment: occasional   Drug use: Never   Sexual activity: Yes  Other Topics Concern   Not on file  Social History Narrative   Not on file   Social Drivers of Health   Financial Resource Strain: Low Risk  (05/30/2023)   Received from [facility]   Overall Financial Resource Strain (CARDIA)    Difficulty of Paying Living Expenses: Not hard at all  Food Insecurity: No Food Insecurity (05/30/2023)   Received from [facility]   Hunger Vital Sign    Within the past 12 months, you worried that your food would run out before you got the money to buy more.: Never true    Within the past 12 months, the food you bought just didn't last and you didn't have money to get more.: Never true  Transportation Needs: No Transportation Needs (05/30/2023)   Received from [facility] - Transportation    Lack of Transportation (Medical): No    Lack of Transportation (Non-Medical): No  Physical Activity: Inactive (12/20/2020)   Received from [facility]   Exercise Vital Sign    On average, how many days per week do you engage in moderate  to strenuous exercise (like a brisk walk)?: 0 days    On average, how many minutes do you engage in exercise at this level?: 0 min  Stress: No Stress Concern Present (12/20/2020)   Received from [facility] - Occupational Stress Questionnaire    Feeling of Stress : Not at all  Social Connections: Unknown (09/30/2021)   Received from [facility]   Social Network    Social Network: Not on file  Intimate Partner Violence: Not At Risk (05/07/2023)   Humiliation, Afraid, Rape, and Kick questionnaire    Fear of Current or Ex-Partner: No    Emotionally Abused: No    Physically Abused: No    Sexually Abused: No     Allergies  Allergen Reactions   Bee Venom Anaphylaxis, Swelling and Other (See Comments)    Cold Sweats, also   Latex Rash    Current Facility-Administered Medications  Medication Dose Route Frequency Provider Last Rate Last Admin   fentaNYL  (SUBLIMAZE ) injection    PRN [name] [name], MD   25 mcg at 11/19/23 1019   Heparin  (Porcine) in NaCl 1000-0.9 UT/500ML-% SOLN    PRN [name] [name], MD   500 mL at 11/19/23 1030   heparin  sodium (porcine) injection    PRN [name] [name], MD   1.6 Units at 11/19/23 1031   lidocaine  (PF) (XYLOCAINE ) 1 % injection    PRN [name] [name], MD   10 mL at 11/19/23 1026   midazolam  (VERSED ) injection    PRN [name] [name], MD   1 mg at 11/19/23 1019   Facility-Administered Medications Ordered in Other Encounters  Medication Dose Route Frequency Provider Last Rate Last Admin   sodium chloride  flush (NS) 0.9 % injection 10 mL  10 mL Intracatheter PRN Cloretta Arley NOVAK, MD   10 mL at 11/17/21 1153    PHYSICAL EXAM Vitals:   11/19/23 0823 11/19/23 0844  BP:  (!) 172/83  Pulse: 63 61  Resp: 12 12  Temp: 97.8 F (36.6 C)   TempSrc: Oral   SpO2: 100% 100%   Elderly man in no distress Regular rate and rhythm Unlabored breathing 2+ radial pulses bilaterally Port in left chest   PERTINENT LABORATORY AND RADIOLOGIC DATA  Most recent CBC    Latest Ref Rng & Units 11/07/2023   10:05 AM 10/08/2023    8:10 AM 09/11/2023    9:23 AM  CBC  WBC 4.0 - 10.5 K/uL 6.1  6.3  6.9   Hemoglobin 13.0 - 17.0 g/dL 7.0  7.9  8.3   Hematocrit 39.0 - 52.0 % 21.4  23.7  25.3   Platelets 150 - 400 K/uL 210  208  195      Most recent CMP    Latest Ref Rng & Units 11/07/2023   10:05 AM 10/08/2023    8:10 AM 09/11/2023    9:23 AM  CMP  Glucose 70 - 99 mg/dL 892  890  882   BUN 6 - 20 mg/dL 50  44  43   Creatinine 0.61 - 1.24 mg/dL 3.20  4.26  3.33   Sodium 135 - 145 mmol/L 133  137  135   Potassium 3.5 - 5.1 mmol/L 4.8  4.0  4.3   Chloride 98 - 111  mmol/L 103  103  103   CO2 22 - 32 mmol/L 17  21  18    Calcium  8.9 - 10.3 mg/dL 8.7  7.9  7.7   Total Protein 6.5 - 8.1 g/dL 6.8  7.0  6.6   Total Bilirubin 0.0 - 1.2 mg/dL 0.5  0.3  0.4   Alkaline Phos 38 - 126 U/L 183  163  153   AST 15 - 41 U/L 10  11  12    ALT 0 - 44 U/L 8  7  8      Renal function Estimated Creatinine Clearance: 13.1 mL/min (A) (by C-G formula based on SCr of 6.79 mg/dL (H)).  Hgb A1c MFr Bld (%)  Date Value  05/08/2023 5.4    LDL Cholesterol  Date Value Ref Range Status  07/27/2020 94 0 - 99 mg/dL Final    Comment:           Total Cholesterol/HDL:CHD Risk Coronary Heart Disease Risk Table                     Men   Women  1/2 Average Risk   3.4   3.3  Average Risk       5.0   4.4  2 X Average Risk   9.6   7.1  3 X Average Risk  23.4   11.0        Use the calculated Patient Ratio above and the CHD Risk Table to determine the patient's CHD Risk.        ATP III CLASSIFICATION (LDL):  <100     mg/dL   Optimal  899-870  mg/dL   Near or Above                    Optimal  130-159  mg/dL   Borderline  839-810  mg/dL   High  >809     mg/dL   Very High Performed at [facility], [address], [place], [place] [address]     [name]. [name], MD FACS [facility] Phone Number: [phone] 11/19/2023 10:42 AM   Total time spent on preparing this encounter including chart review, data review, collecting history, examining the patient, and coordinating care: 45 minutes  Portions of this report may have been transcribed using voice recognition software.  Every effort has been made to ensure accuracy; however, inadvertent computerized transcription errors may still be present.

## 2023-11-19 NOTE — Op Note (Signed)
 DATE OF SERVICE: 11/19/2023  PATIENT:  Jeffrey Young  58 y.o. male  PRE-OPERATIVE DIAGNOSIS: End-stage renal disease  POST-OPERATIVE DIAGNOSIS:  Same  PROCEDURE:   1) ultrasound-guided right internal jugular vascular access (CPT [phone]) 2) placement of right internal jugular tunneled dialysis catheter with ultrasound and fluoroscopic guidance (CPT [phone]) 3) conscious sedation (CPT 99151) 4) outpatient new patient evaluation and management - level 4 (CPT 99204)  SURGEON:  Surgeons and Role:    * [name] [name], MD - Primary  ASSISTANT: none  ANESTHESIA:   local and IV sedation  EBL: minimal  BLOOD ADMINISTERED:none  DRAINS: none   LOCAL MEDICATIONS USED:  LIDOCAINE    SPECIMEN:  none  COUNTS: confirmed correct.  TOURNIQUET:  none  PATIENT DISPOSITION:  PACU - hemodynamically stable.   Delay start of Pharmacological VTE agent (>24hrs) due to surgical blood loss or risk of bleeding: no  INDICATION FOR PROCEDURE: Jeffrey Young is a 58 y.o. male with ESRD in need of dialysis access. He needs to start urgently. After careful discussion of risks, benefits, and alternatives the patient was offered Jeffrey Young. The patient understood and wished to proceed.  OPERATIVE FINDINGS: successful placement of RIJ TDC.  DESCRIPTION OF PROCEDURE: After identification of the patient in the pre-operative holding area, the patient was transferred to the operating room. The patient was positioned supine on the operating room table. Anesthesia was induced. The right neck and chest was prepped and draped in standard fashion. A surgical pause was performed confirming correct patient, procedure, and operative location.  Using ultrasound guidance the right internal jugular vein was accessed with micropuncture technique.  Through the micropuncture sheath a floppy J-wire was advanced into the superior vena cava.  A small incision was made around the skin access point.  The access point was serially dilated  under direct fluoroscopic guidance.  A peel-away sheath was introduced into the superior vena cava under fluoroscopic guidance.  A counterincision was made in the chest under the clavicle.  A 19 cm tunnel dialysis catheter was then tunneled under the skin, over the clavicle into the incision in the neck.  The tunneling device was removed and the catheter fed through the peel-away sheath into the superior vena cava.  The peel-away sheath was removed and the catheter gently pulled back.  Adequate position was confirmed with x-ray.  The catheter was tested and found to flush and draw back well.  Catheter was heparin  locked.  Caps were applied.  Catheter was sutured to the skin.  The neck incision was closed with 4-0 Monocryl.  Conscious sedation was administered with the use of IV fentanyl  and midazolam  under continuous physician and nurse monitoring.  Heart rate, blood pressure, and oxygen saturation were continuously monitored.  Total sedation time was 19 minutes  Upon completion of the case instrument and sharps counts were confirmed correct. The patient was transferred to the PACU in good condition. I was present for all portions of the procedure.  FOLLOW UP PLAN: right arm AVF vs. AVG as dialysis schedule allows.  [name]. [name], MD [facility] [address] [facility] Phone Number: [phone] 11/19/2023 10:45 AM

## 2023-11-20 ENCOUNTER — Encounter: Payer: Self-pay | Admitting: Nurse Practitioner

## 2023-11-20 ENCOUNTER — Inpatient Hospital Stay: Attending: Oncology

## 2023-11-20 ENCOUNTER — Telehealth: Payer: Self-pay | Admitting: Nurse Practitioner

## 2023-11-20 ENCOUNTER — Encounter: Payer: Self-pay | Admitting: Oncology

## 2023-11-20 ENCOUNTER — Inpatient Hospital Stay (HOSPITAL_BASED_OUTPATIENT_CLINIC_OR_DEPARTMENT_OTHER): Admitting: Nurse Practitioner

## 2023-11-20 ENCOUNTER — Telehealth: Payer: Self-pay

## 2023-11-20 ENCOUNTER — Inpatient Hospital Stay

## 2023-11-20 ENCOUNTER — Other Ambulatory Visit: Payer: Self-pay

## 2023-11-20 VITALS — BP 164/84 | HR 64 | Temp 98.1°F | Resp 18 | Ht 73.0 in | Wt 184.6 lb

## 2023-11-20 DIAGNOSIS — R112 Nausea with vomiting, unspecified: Secondary | ICD-10-CM

## 2023-11-20 DIAGNOSIS — D649 Anemia, unspecified: Secondary | ICD-10-CM

## 2023-11-20 DIAGNOSIS — I110 Hypertensive heart disease with heart failure: Secondary | ICD-10-CM | POA: Diagnosis not present

## 2023-11-20 DIAGNOSIS — N19 Unspecified kidney failure: Secondary | ICD-10-CM

## 2023-11-20 DIAGNOSIS — I5032 Chronic diastolic (congestive) heart failure: Secondary | ICD-10-CM | POA: Insufficient documentation

## 2023-11-20 DIAGNOSIS — C186 Malignant neoplasm of descending colon: Secondary | ICD-10-CM

## 2023-11-20 DIAGNOSIS — N186 End stage renal disease: Secondary | ICD-10-CM

## 2023-11-20 DIAGNOSIS — C189 Malignant neoplasm of colon, unspecified: Secondary | ICD-10-CM

## 2023-11-20 DIAGNOSIS — E119 Type 2 diabetes mellitus without complications: Secondary | ICD-10-CM | POA: Insufficient documentation

## 2023-11-20 DIAGNOSIS — Z95828 Presence of other vascular implants and grafts: Secondary | ICD-10-CM

## 2023-11-20 DIAGNOSIS — C185 Malignant neoplasm of splenic flexure: Secondary | ICD-10-CM

## 2023-11-20 LAB — CBC WITH DIFFERENTIAL (CANCER CENTER ONLY)
Abs Immature Granulocytes: 0.02 10*3/uL (ref 0.00–0.07)
Basophils Absolute: 0.1 10*3/uL (ref 0.0–0.1)
Basophils Relative: 1 %
Eosinophils Absolute: 0.1 10*3/uL (ref 0.0–0.5)
Eosinophils Relative: 2 %
HCT: 27.4 % — ABNORMAL LOW (ref 39.0–52.0)
Hemoglobin: 9.1 g/dL — ABNORMAL LOW (ref 13.0–17.0)
Immature Granulocytes: 0 %
Lymphocytes Relative: 14 %
Lymphs Abs: 0.9 10*3/uL (ref 0.7–4.0)
MCH: 29.6 pg (ref 26.0–34.0)
MCHC: 33.2 g/dL (ref 30.0–36.0)
MCV: 89.3 fL (ref 80.0–100.0)
Monocytes Absolute: 0.7 10*3/uL (ref 0.1–1.0)
Monocytes Relative: 11 %
Neutro Abs: 4.6 10*3/uL (ref 1.7–7.7)
Neutrophils Relative %: 72 %
Platelet Count: 203 10*3/uL (ref 150–400)
RBC: 3.07 MIL/uL — ABNORMAL LOW (ref 4.22–5.81)
RDW: 14 % (ref 11.5–15.5)
WBC Count: 6.3 10*3/uL (ref 4.0–10.5)
nRBC: 0 % (ref 0.0–0.2)

## 2023-11-20 LAB — CMP (CANCER CENTER ONLY)
ALT: 11 U/L (ref 0–44)
AST: 16 U/L (ref 15–41)
Albumin: 3.2 g/dL — ABNORMAL LOW (ref 3.5–5.0)
Alkaline Phosphatase: 295 U/L — ABNORMAL HIGH (ref 38–126)
Anion gap: 11 (ref 5–15)
BUN: 44 mg/dL — ABNORMAL HIGH (ref 6–20)
CO2: 18 mmol/L — ABNORMAL LOW (ref 22–32)
Calcium: 8.4 mg/dL — ABNORMAL LOW (ref 8.9–10.3)
Chloride: 106 mmol/L (ref 98–111)
Creatinine: 6.41 mg/dL — ABNORMAL HIGH (ref 0.61–1.24)
GFR, Estimated: 9 mL/min — ABNORMAL LOW (ref >60)
Glucose, Bld: 96 mg/dL (ref 70–99)
Potassium: 4.5 mmol/L (ref 3.5–5.1)
Sodium: 134 mmol/L — ABNORMAL LOW (ref 135–145)
Total Bilirubin: 0.5 mg/dL (ref 0.0–1.2)
Total Protein: 6.9 g/dL (ref 6.5–8.1)

## 2023-11-20 LAB — SAMPLE TO BLOOD BANK

## 2023-11-20 LAB — CEA (ACCESS): CEA (CHCC): 103.41 ng/mL — ABNORMAL HIGH (ref 0.00–5.00)

## 2023-11-20 MED ORDER — HEPARIN SOD (PORK) LOCK FLUSH 100 UNIT/ML IV SOLN
500.0000 [IU] | Freq: Once | INTRAVENOUS | Status: AC
  Administered 2023-11-20: 500 [IU] via INTRAVENOUS

## 2023-11-20 MED ORDER — SODIUM CHLORIDE 0.9% FLUSH
10.0000 mL | Freq: Three times a day (TID) | INTRAVENOUS | Status: DC
  Administered 2023-11-20: 10 mL

## 2023-11-20 NOTE — Progress Notes (Signed)
 Applied blue blood bank bracelet to right wrist, stated to patient keep bracelet on,just in case a blood transfusion is needed. Understood

## 2023-11-20 NOTE — Telephone Encounter (Signed)
 Attempted to call for surgery scheduling. LVM

## 2023-11-20 NOTE — Progress Notes (Signed)
 [facility] OFFICE PROGRESS NOTE   Diagnosis: Colon cancer  INTERVAL HISTORY:   [name] returns as scheduled.  He had a right internal jugular dialysis catheter placed 11/19/2023.  He is scheduled to begin dialysis tomorrow.  He has intermittent nausea/vomiting.  Bowels are moving.  He complains of pain related to the dialysis catheter.  No abdominal pain.  Objective:  Vital signs in last 24 hours:  Blood pressure (!) 164/84, pulse 64, temperature 98.1 F (36.7 C), temperature source Temporal, resp. rate 18, height 6' 1 (1.854 m), weight 184 lb 9.6 oz (83.7 kg), SpO2 100%.    Resp: Lungs clear bilaterally. Cardio: Regular rate and rhythm. GI: No hepatosplenomegaly. Vascular: No leg edema. Port-A-Cath without erythema.  Lab Results:  Lab Results  Component Value Date   WBC 6.3 11/20/2023   HGB 9.1 (L) 11/20/2023   HCT 27.4 (L) 11/20/2023   MCV 89.3 11/20/2023   PLT 203 11/20/2023   NEUTROABS 4.6 11/20/2023    Imaging:  PERIPHERAL VASCULAR CATHETERIZATION Result Date: 11/19/2023 DATE OF SERVICE: 11/19/2023  PATIENT:  Jeffrey Young  58 y.o. male  PRE-OPERATIVE DIAGNOSIS: End-stage renal disease  POST-OPERATIVE DIAGNOSIS:  Same  PROCEDURE:  1) ultrasound-guided right internal jugular vascular access (CPT [phone]) 2) placement of right internal jugular tunneled dialysis catheter with ultrasound and fluoroscopic guidance (CPT [phone]) 3) conscious sedation (CPT 99151) 4) outpatient new patient evaluation and management - level 4 (CPT 99204)  SURGEON:  Surgeons and Role:    * [name] [name], MD - Primary  ASSISTANT: none  ANESTHESIA:   local and IV sedation  EBL: minimal  BLOOD ADMINISTERED:none  DRAINS: none  LOCAL MEDICATIONS USED:  LIDOCAINE   SPECIMEN:  none  COUNTS: confirmed correct.  TOURNIQUET:  none  PATIENT DISPOSITION:  PACU - hemodynamically stable.  Delay start of Pharmacological VTE agent (>24hrs) due to surgical blood loss or risk of bleeding: no   INDICATION FOR PROCEDURE: Jeffrey Young is a 58 y.o. male with ESRD in need of dialysis access. He needs to start urgently. After careful discussion of risks, benefits, and alternatives the patient was offered [facility]. The patient understood and wished to proceed.  OPERATIVE FINDINGS: successful placement of RIJ TDC.  DESCRIPTION OF PROCEDURE: After identification of the patient in the pre-operative holding area, the patient was transferred to the operating room. The patient was positioned supine on the operating room table. Anesthesia was induced. The right neck and chest was prepped and draped in standard fashion. A surgical pause was performed confirming correct patient, procedure, and operative location.  Using ultrasound guidance the right internal jugular vein was accessed with micropuncture technique.  Through the micropuncture sheath a floppy J-wire was advanced into the superior vena cava.  A small incision was made around the skin access point.  The access point was serially dilated under direct fluoroscopic guidance.  A peel-away sheath was introduced into the superior vena cava under fluoroscopic guidance.  A counterincision was made in the chest under the clavicle.  A 19 cm tunnel dialysis catheter was then tunneled under the skin, over the clavicle into the incision in the neck.  The tunneling device was removed and the catheter fed through the peel-away sheath into the superior vena cava.  The peel-away sheath was removed and the catheter gently pulled back.  Adequate position was confirmed with x-ray.  The catheter was tested and found to flush and draw back well.  Catheter was heparin  locked.  Caps were applied.  Catheter  was sutured to the skin.  The neck incision was closed with 4-0 Monocryl.  Conscious sedation was administered with the use of IV fentanyl  and midazolam  under continuous physician and nurse monitoring.  Heart rate, blood pressure, and oxygen saturation were continuously monitored.   Total sedation time was 19 minutes  Upon completion of the case instrument and sharps counts were confirmed correct. The patient was transferred to the PACU in good condition. I was present for all portions of the procedure.  FOLLOW UP PLAN: right arm AVF vs. AVG as dialysis schedule allows.  [name]. [name], MD [facility] Phone Number: [phone] 11/19/2023 10:45 AM    Medications: I have reviewed the patient's current medications.  Assessment/Plan: Descending colon cancer, stage IIIc (T3N2b M0), status post a partial left colectomy 07/30/2020, 9/16 lymph nodes positive, lymphovascular invasion, 1 satellite nodule, negative margins, MSS, no loss of mismatch repair protein expression; foundation 1 K-ras wild-type, NRAS Q61H, microsatellite stable, tumor mutation burden 4. -History of large polyp in the left side of the colon-referred to [facility] in 05/2018 for procedure canceled secondary to COVID-19 pandemic.  Procedure was not rescheduled. -CT chest/abdomen/pelvis with contrast 07/27/2020-3 small pulmonary nodules less than 5 mm favored to be benign, circumferential luminal narrowing of the distal transverse colon concerning for malignancy, no metastatic adenopathy in the mesentery porta hepatis, no for metastasis. -CEA on 07/27/2020 was 17.3; 37 on 08/30/2020; 33 on 09/13/2020 -Colonoscopy performed 07/27/2020 showed a fungating, infiltrative and ulcerated nonobstructing large mass in the proximal descending colon.  Biopsy-adenocarcinoma -Cycle 1 FOLFOX 08/30/2020 -Cycle 2 FOLFOX 09/13/2020, Emend  added for delayed nausea -Cycle 3 FOLFOX 09/27/2020 -Cycle 4 FOLFOX 10/11/2020 -Cycle 5 FOLFOX 11/08/2020 -Cycle 6 FOLFOX 11/23/2020 -CT 12/03/2020-prior 3 mm left apical nodule no longer seen, no new/suspicious pulmonary nodules, no evidence of metastatic disease -Cycle 7 FOLFOX 12/06/2020 -Cycle 8 FOLFOX 12/21/2020 -Cycle 9 FOLFOX 01/03/2021 -Cycle 10 FOLFOX 01/17/2021 -Cycle  11 FOLFOX 01/31/2021, oxaliplatin  held secondary to neuropathy symptoms -Cycle 12 FOLFOX 02/15/2021, oxaliplatin  held secondary to neuropathy -CT abdomen/pelvis 04/02/2021-no evidence of recurrent colon cancer -CT chest 04/08/2021-no evidence of metastatic disease -Elevated CEA February and March 2023 -08/17/2021 PET scan-no evidence of local recurrence or metastasis -09/26/2021-Guardant-ctDNA detected -Colonoscopy 10/04/2021-negative -CT 11/17/2021-no evidence of metastatic disease -CTs 01/08/2022-no evidence of metastatic disease -PET scan 08/10/2022-several newly enlarged lymph nodes with moderate metabolic activity -CT 11/06/2022-stable left supraclavicular and retroperitoneal nodes, mild progression of pelvic adenopathy, new 6 mm segment 2 liver lesion -CT 03/05/2023-increase size of left supraclavicular node and retroperitoneal nodes, increased size of gastropathic and left iliac side chain lymph nodes, stable subtle hypoattenuating lesions at segment 2 in the hepatic dome -CT abdomen/pelvis 05/07/2023-new intrahepatic and extrahepatic biliary duct dilatation with possible associated periportal edema.  Common duct dilated up to 15 mm diameter, abruptly terminates prior to entering the head of the pancreas.  Distended gallbladder with possible gallbladder wall thickening and pericholecystic edema.  No substantial change in lymphadenopathy of the gastrohepatic ligament, hepatoduodenal ligament and left external iliac chain. 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  Moderate diffuse intrahepatic and proximal extrahepatic biliary duct dilatation.  Mild gallbladder distention with moderate diffuse gallbladder wall thickening.  Minimal pericholecystic fluid and fat stranding.  Solitary 1.1 cm anterior segment 2 left liver mass.  Multiple mildly enlarged porta hepatis and portacaval  nodes. -Biopsy perihepatic lymph node 05/10/2023-malignant cells,  adenocarcinoma -CTs 07/02/2023-persistent biliary duct dilatation with indwelling stent.  Persistent wall thickening of the gallbladder.  Subtle areas of nodal enlargement identified in the abdomen and pelvis, similar to recent prior examinations.  Enlarged node in the supraclavicular region appears smaller.  No new thoracic nodes.  Mild areas of groundglass in the upper lobes of the lungs bilaterally, nonspecific. 2.  Anemia due to GI bleeding, iron deficiency?,  Renal insufficiency? 3.  New onset acute diastolic CHF March 2022 4.  Diabetes mellitus 5.  Renal failure-dialysis catheter placed 11/19/2023.  Dialysis scheduled to begin 11/21/2023 Monday Wednesday Friday schedule. 6.  Hypertension 7.  History of left transmetatarsal amputation 8.  History of colon polyps 9.  Neuropathy 10.  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  Admission with obstructive jaundice 05/07/2023 - 05/22/2023.  Upper EUS 05/10/2023-lymph node visualized and measured in the porta hepatis region; FNA showed malignant cells, adenocarcinoma.  ERCP 05/10/2023-biliary tract obstruction secondary to a mass in the common bile duct, plastic stent placed into the common bile duct.  ERCP 05/18/2023-visibly patent stent from the common bile duct was seen in the major papilla.  Stent removed from the common bile duct.  2 plastic stents placed into the common bile duct.      Disposition: Jeffrey Young appears stable.  He had a dialysis catheter placed yesterday.  He is scheduled to begin hemodialysis tomorrow.  We again discussed beginning FOLFIRI chemotherapy and reviewed potential side effects.  He understands he may be at increased risk for toxicity due to the renal failure.  We mutually decided to regroup in 2 weeks, see how he is doing with dialysis, and determine a start  date for chemotherapy.  He and his wife agree with this plan.  UGT1A1 gene polymorphism test has been requested to aid in the dosing of irinotecan.  He will return for follow-up 12/04/2023.  We are available to see him sooner if needed.  Patient seen with Dr. Cloretta.  Olam Ned ANP/GNP-BC   11/20/2023  12:32 PM  This was a shared visit with Olam Ned.  Jeffrey Young was interviewed and examined.  He is scheduled to start hemodialysis tomorrow.  I suspect the intermittent nausea and anorexia are related to renal failure as opposed to metastatic colon cancer.  We discussed the plan for FOLFIRI with Jeffrey Young and his wife.  We will hold chemotherapy at least until he has completed several weeks of dialysis.  We discussed the potential increased toxicity associated with irinotecan in patients on hemodialysis.  We submitted blood for UGT1A1 genotype analysis prior to considering treatment with irinotecan.  I was present for greater than 50% of today's visit.  I performed medical decision making.  Jeffrey Cloretta, MD

## 2023-11-20 NOTE — Telephone Encounter (Signed)
 Reached out to pt, running late will be here shortly.

## 2023-11-21 ENCOUNTER — Encounter

## 2023-11-26 ENCOUNTER — Ambulatory Visit

## 2023-11-26 ENCOUNTER — Other Ambulatory Visit

## 2023-11-26 ENCOUNTER — Ambulatory Visit: Admitting: Oncology

## 2023-11-26 LAB — MISC LABCORP TEST (SEND OUT): Labcorp test code: 511200

## 2023-11-28 ENCOUNTER — Encounter

## 2023-11-30 ENCOUNTER — Encounter (HOSPITAL_COMMUNITY): Payer: Self-pay | Admitting: Gastroenterology

## 2023-11-30 NOTE — Progress Notes (Signed)
 Attempted to obtain medical history for pre op call via telephone, unable to reach at this time. HIPAA compliant voicemail message left requesting return call to pre surgical testing department.

## 2023-12-03 ENCOUNTER — Telehealth: Payer: Self-pay

## 2023-12-03 NOTE — Telephone Encounter (Signed)
 Received this message from A. Janit GLENWOOD Shilling from Nephrology Dept at the [facility] called and left a message saying Jeffrey Young (MRN: [id number]) [name] is not running.  the PA wants to exchange it at the same time of surgery.  Has alarming increased pressures.  [phone] ext [phone]  CPT code 63441 added

## 2023-12-04 ENCOUNTER — Inpatient Hospital Stay

## 2023-12-04 ENCOUNTER — Inpatient Hospital Stay: Admitting: Nurse Practitioner

## 2023-12-04 ENCOUNTER — Other Ambulatory Visit: Payer: Self-pay

## 2023-12-05 ENCOUNTER — Encounter (HOSPITAL_COMMUNITY): Payer: Self-pay | Admitting: Vascular Surgery

## 2023-12-05 ENCOUNTER — Other Ambulatory Visit: Payer: Self-pay

## 2023-12-05 NOTE — Progress Notes (Signed)
 PCP - Pura Lenis, MD  Cardiologist -    PPM/ICD - denies Device Orders - n/a Rep Notified - n/a  Chest x-ray -  EKG - 05-07-23 Stress Test - denies ECHO - 04-03-21 Cardiac Cath - denies  CPAP - denies  GLP-1 -  Fasting Blood Sugar - Per patient wife blood sugar around 130 Checks Blood Sugar Dexacom to right upper arm  Blood Thinner Instructions: denies Aspirin Instructions: n/a  ERAS Protcol - NPO  COVID TEST- N/a  Anesthesia review: no  Patient verbally denies any shortness of breath, fever, cough and chest pain during phone call   -------------  SDW INSTRUCTIONS given:  Your procedure is scheduled on December 06, 2023.  Report to [facility] Main Entrance A at 9:15 A.M., and check in at the Admitting office.  Call this number if you have problems the morning of surgery:  [phone]   Remember:  Do not eat or drink after midnight the night before your surgery      Take these medicines the morning of surgery with A SIP OF WATER  amLODipine  (NORVASC )  carvedilol  (COREG )  pantoprazole  (PROTONIX )  prochlorperazine  (COMPAZINE )    As of today, STOP taking any Aspirin (unless otherwise instructed by your surgeon) Aleve, Naproxen, Ibuprofen, Motrin, Advil, Goody's, BC's, all herbal medications, fish oil, and all vitamins.   WHAT DO I DO ABOUT MY DIABETES MEDICATION?   Do not take oral diabetes medicines (pills) the morning of surgery.  THE NIGHT BEFORE SURGERY, take 5 units of LANTUS  insulin .       THE MORNING OF SURGERY, take 7 units of LANTUS  insulin .  The day of surgery, do not take other diabetes injectables, including Byetta (exenatide), Bydureon (exenatide ER), Victoza (liraglutide), or Trulicity (dulaglutide).  If your CBG is greater than 220 mg/dL, you may take  of your sliding scale (correction) dose of insulin .   HOW TO MANAGE YOUR DIABETES BEFORE AND AFTER SURGERY  Why is it important to control my blood sugar before and after  surgery? Improving blood sugar levels before and after surgery helps healing and can limit problems. A way of improving blood sugar control is eating a healthy diet by:  Eating less sugar and carbohydrates  Increasing activity/exercise  Talking with your doctor about reaching your blood sugar goals High blood sugars (greater than 180 mg/dL) can raise your risk of infections and slow your recovery, so you will need to focus on controlling your diabetes during the weeks before surgery. Make sure that the doctor who takes care of your diabetes knows about your planned surgery including the date and location.  How do I manage my blood sugar before surgery? Check your blood sugar at least 4 times a day, starting 2 days before surgery, to make sure that the level is not too high or low.  Check your blood sugar the morning of your surgery when you wake up and every 2 hours until you get to the Short Stay unit.  If your blood sugar is less than 70 mg/dL, you will need to treat for low blood sugar: Do not take insulin . Treat a low blood sugar (less than 70 mg/dL) with  cup of clear juice (cranberry or apple), 4 glucose tablets, OR glucose gel. Recheck blood sugar in 15 minutes after treatment (to make sure it is greater than 70 mg/dL). If your blood sugar is not greater than 70 mg/dL on recheck, call [phone] for further instructions. Report your blood sugar to the short stay  nurse when you get to Short Stay.  If you are admitted to the hospital after surgery: Your blood sugar will be checked by the staff and you will probably be given insulin  after surgery (instead of oral diabetes medicines) to make sure you have good blood sugar levels. The goal for blood sugar control after surgery is 80-180 mg/dL.                    Do not wear jewelry, make up, or nail polish            Do not wear lotions, powders, perfumes/colognes, or deodorant.            Do not shave 48 hours prior to surgery.  Men  may shave face and neck.            Do not bring valuables to the hospital.            [facility] is not responsible for any belongings or valuables.  Do NOT Smoke (Tobacco/Vaping) 24 hours prior to your procedure If you use a CPAP at night, you may bring all equipment for your overnight stay.   Contacts, glasses, dentures or bridgework may not be worn into surgery.      For patients admitted to the hospital, discharge time will be determined by your treatment team.   Patients discharged the day of surgery will not be allowed to drive home, and someone needs to stay with them for 24 hours.    Special instructions:   [place]- Preparing For Surgery  Before surgery, you can play an important role. Because skin is not sterile, your skin needs to be as free of germs as possible. You can reduce the number of germs on your skin by washing with CHG (chlorahexidine gluconate) Soap before surgery.  CHG is an antiseptic cleaner which kills germs and bonds with the skin to continue killing germs even after washing.    Oral Hygiene is also important to reduce your risk of infection.  Remember - BRUSH YOUR TEETH THE MORNING OF SURGERY WITH YOUR REGULAR TOOTHPASTE  Please do not use if you have an allergy to CHG or antibacterial soaps. If your skin becomes reddened/irritated stop using the CHG.  Do not shave (including legs and underarms) for at least 48 hours prior to first CHG shower. It is OK to shave your face.  Please follow these instructions carefully.   Shower the NIGHT BEFORE SURGERY and the MORNING OF SURGERY with DIAL Soap.   Pat yourself dry with a CLEAN TOWEL.  Wear CLEAN PAJAMAS to bed the night before surgery  Place CLEAN SHEETS on your bed the night of your first shower and DO NOT SLEEP WITH PETS.   Day of Surgery: Please shower morning of surgery  Wear Clean/Comfortable clothing the morning of surgery Do not apply any deodorants/lotions.   Remember to brush your teeth  WITH YOUR REGULAR TOOTHPASTE.   Questions were answered. Patient verbalized understanding of instructions.

## 2023-12-06 ENCOUNTER — Ambulatory Visit (HOSPITAL_BASED_OUTPATIENT_CLINIC_OR_DEPARTMENT_OTHER): Admitting: Anesthesiology

## 2023-12-06 ENCOUNTER — Other Ambulatory Visit: Payer: Self-pay

## 2023-12-06 ENCOUNTER — Ambulatory Visit (HOSPITAL_COMMUNITY)

## 2023-12-06 ENCOUNTER — Other Ambulatory Visit (HOSPITAL_COMMUNITY): Payer: Self-pay

## 2023-12-06 ENCOUNTER — Encounter (HOSPITAL_COMMUNITY)
Admission: RE | Disposition: A | Discharge status: Home / Self Care | Payer: Self-pay | Source: Home / Self Care | Attending: Vascular Surgery

## 2023-12-06 ENCOUNTER — Ambulatory Visit (HOSPITAL_COMMUNITY): Admitting: Anesthesiology

## 2023-12-06 ENCOUNTER — Ambulatory Visit (HOSPITAL_COMMUNITY)
Admission: RE | Admit: 2023-12-06 | Discharge: 2023-12-06 | Disposition: A | Discharge status: Home / Self Care | Attending: Vascular Surgery | Admitting: Vascular Surgery

## 2023-12-06 DIAGNOSIS — I132 Hypertensive heart and chronic kidney disease with heart failure and with stage 5 chronic kidney disease, or end stage renal disease: Secondary | ICD-10-CM | POA: Diagnosis not present

## 2023-12-06 DIAGNOSIS — Z9049 Acquired absence of other specified parts of digestive tract: Secondary | ICD-10-CM | POA: Insufficient documentation

## 2023-12-06 DIAGNOSIS — I5020 Unspecified systolic (congestive) heart failure: Secondary | ICD-10-CM | POA: Insufficient documentation

## 2023-12-06 DIAGNOSIS — Z4901 Encounter for fitting and adjustment of extracorporeal dialysis catheter: Secondary | ICD-10-CM

## 2023-12-06 DIAGNOSIS — Z794 Long term (current) use of insulin: Secondary | ICD-10-CM | POA: Insufficient documentation

## 2023-12-06 DIAGNOSIS — Z8249 Family history of ischemic heart disease and other diseases of the circulatory system: Secondary | ICD-10-CM | POA: Insufficient documentation

## 2023-12-06 DIAGNOSIS — G709 Myoneural disorder, unspecified: Secondary | ICD-10-CM | POA: Insufficient documentation

## 2023-12-06 DIAGNOSIS — Z992 Dependence on renal dialysis: Secondary | ICD-10-CM

## 2023-12-06 DIAGNOSIS — I509 Heart failure, unspecified: Secondary | ICD-10-CM

## 2023-12-06 DIAGNOSIS — Z85038 Personal history of other malignant neoplasm of large intestine: Secondary | ICD-10-CM | POA: Insufficient documentation

## 2023-12-06 DIAGNOSIS — M1990 Unspecified osteoarthritis, unspecified site: Secondary | ICD-10-CM | POA: Diagnosis not present

## 2023-12-06 DIAGNOSIS — E1122 Type 2 diabetes mellitus with diabetic chronic kidney disease: Secondary | ICD-10-CM | POA: Insufficient documentation

## 2023-12-06 DIAGNOSIS — N186 End stage renal disease: Secondary | ICD-10-CM

## 2023-12-06 HISTORY — DX: Heart failure, unspecified: I50.9

## 2023-12-06 HISTORY — PX: INSERTION OF DIALYSIS CATHETER: SHX1324

## 2023-12-06 HISTORY — PX: INSERTION OF ARTERIOVENOUS (AV) ARTEGRAFT ARM: SHX6779

## 2023-12-06 LAB — POCT I-STAT, CHEM 8
BUN: 12 mg/dL (ref 6–20)
Calcium, Ion: 1.01 mmol/L — ABNORMAL LOW (ref 1.15–1.40)
Chloride: 92 mmol/L — ABNORMAL LOW (ref 98–111)
Creatinine, Ser: 3.7 mg/dL — ABNORMAL HIGH (ref 0.61–1.24)
Glucose, Bld: 139 mg/dL — ABNORMAL HIGH (ref 70–99)
HCT: 33 % — ABNORMAL LOW (ref 39.0–52.0)
Hemoglobin: 11.2 g/dL — ABNORMAL LOW (ref 13.0–17.0)
Potassium: 3.8 mmol/L (ref 3.5–5.1)
Sodium: 133 mmol/L — ABNORMAL LOW (ref 135–145)
TCO2: 33 mmol/L — ABNORMAL HIGH (ref 22–32)

## 2023-12-06 LAB — GLUCOSE, CAPILLARY
Glucose-Capillary: 144 mg/dL — ABNORMAL HIGH (ref 70–99)
Glucose-Capillary: 150 mg/dL — ABNORMAL HIGH (ref 70–99)

## 2023-12-06 SURGERY — INSERTION, GRAFT, ARTERIOVENOUS, UPPER EXTREMITY
Anesthesia: General | Site: Chest | Laterality: Right

## 2023-12-06 MED ORDER — CHLORHEXIDINE GLUCONATE 0.12 % MT SOLN: 15.0000 mL | Freq: Once | OROMUCOSAL | Status: AC

## 2023-12-06 MED ORDER — DROPERIDOL 2.5 MG/ML IJ SOLN
0.6250 mg | Freq: Once | INTRAMUSCULAR | Status: AC | PRN
  Administered 2023-12-06: 0.625 mg via INTRAVENOUS

## 2023-12-06 MED ORDER — INSULIN ASPART 100 UNIT/ML IJ SOLN: 0.0000 [IU] | INTRAMUSCULAR | Status: DC | PRN

## 2023-12-06 MED ORDER — ONDANSETRON HCL 4 MG/2ML IJ SOLN
INTRAMUSCULAR | Status: DC | PRN
  Administered 2023-12-06: 4 mg via INTRAVENOUS

## 2023-12-06 MED ORDER — HEPARIN SODIUM (PORCINE) 1000 UNIT/ML IJ SOLN
INTRAMUSCULAR | Status: DC | PRN
  Administered 2023-12-06: 5000 [IU] via INTRAVENOUS

## 2023-12-06 MED ORDER — HEPARIN 6000 UNIT IRRIGATION SOLUTION
Status: AC
Start: 2023-12-06 — End: 2023-12-06
  Filled 2023-12-06: qty 500

## 2023-12-06 MED ORDER — FENTANYL CITRATE (PF) 100 MCG/2ML IJ SOLN: 25.0000 ug | INTRAMUSCULAR | Status: DC | PRN

## 2023-12-06 MED ORDER — SODIUM CHLORIDE 0.9 % IV SOLN: INTRAVENOUS | Status: DC | PRN

## 2023-12-06 MED ORDER — PHENYLEPHRINE HCL-NACL 20-0.9 MG/250ML-% IV SOLN
INTRAVENOUS | Status: DC | PRN
  Administered 2023-12-06: 20 ug/min via INTRAVENOUS

## 2023-12-06 MED ORDER — SODIUM CHLORIDE 0.9% FLUSH: 3.0000 mL | INTRAVENOUS | Status: DC | PRN

## 2023-12-06 MED ORDER — SODIUM CHLORIDE 0.9 % IV SOLN: INTRAVENOUS | Status: DC

## 2023-12-06 MED ORDER — DEXAMETHASONE SODIUM PHOSPHATE 10 MG/ML IJ SOLN
INTRAMUSCULAR | Status: DC | PRN
  Administered 2023-12-06: 5 mg via INTRAVENOUS

## 2023-12-06 MED ORDER — CHLORHEXIDINE GLUCONATE 4 % EX SOLN: 60.0000 mL | Freq: Once | CUTANEOUS | Status: DC

## 2023-12-06 MED ORDER — FENTANYL CITRATE (PF) 250 MCG/5ML IJ SOLN
INTRAMUSCULAR | Status: DC | PRN
  Administered 2023-12-06 (×3): 25 ug via INTRAVENOUS
  Administered 2023-12-06 (×2): 50 ug via INTRAVENOUS
  Administered 2023-12-06 (×3): 25 ug via INTRAVENOUS

## 2023-12-06 MED ORDER — 0.9 % SODIUM CHLORIDE (POUR BTL) OPTIME
TOPICAL | Status: DC | PRN
  Administered 2023-12-06: 1000 mL

## 2023-12-06 MED ORDER — ACETAMINOPHEN 500 MG PO TABS: 1000.0000 mg | ORAL_TABLET | Freq: Once | ORAL | Status: DC

## 2023-12-06 MED ORDER — DROPERIDOL 2.5 MG/ML IJ SOLN
INTRAMUSCULAR | Status: AC
  Filled 2023-12-06: qty 2

## 2023-12-06 MED ORDER — CEFAZOLIN SODIUM-DEXTROSE 2-4 GM/100ML-% IV SOLN
2.0000 g | INTRAVENOUS | Status: AC
  Administered 2023-12-06: 2 g via INTRAVENOUS

## 2023-12-06 MED ORDER — ORAL CARE MOUTH RINSE: 15.0000 mL | Freq: Once | OROMUCOSAL | Status: AC

## 2023-12-06 MED ORDER — OXYCODONE HCL 5 MG PO TABS: 5.0000 mg | ORAL_TABLET | Freq: Once | ORAL | Status: DC | PRN

## 2023-12-06 MED ORDER — HEPARIN SODIUM (PORCINE) 1000 UNIT/ML IJ SOLN
INTRAMUSCULAR | Status: AC
  Filled 2023-12-06: qty 10

## 2023-12-06 MED ORDER — OXYCODONE HCL 5 MG PO TABS
5.0000 mg | ORAL_TABLET | Freq: Four times a day (QID) | ORAL | 0 refills | Status: DC | PRN
  Filled 2023-12-06: qty 8, 2d supply, fill #0

## 2023-12-06 MED ORDER — PROPOFOL 10 MG/ML IV BOLUS
INTRAVENOUS | Status: DC | PRN
  Administered 2023-12-06: 150 mg via INTRAVENOUS

## 2023-12-06 MED ORDER — HEPARIN SODIUM (PORCINE) 1000 UNIT/ML IJ SOLN
INTRAMUSCULAR | Status: DC | PRN
Start: 2023-12-06 — End: 2023-12-06
  Administered 2023-12-06: 3800 [IU] via INTRAVENOUS

## 2023-12-06 MED ORDER — LIDOCAINE-EPINEPHRINE (PF) 1 %-1:200000 IJ SOLN
INTRAMUSCULAR | Status: AC
  Filled 2023-12-06: qty 30

## 2023-12-06 MED ORDER — OXYCODONE HCL 5 MG/5ML PO SOLN: 5.0000 mg | Freq: Once | ORAL | Status: DC | PRN

## 2023-12-06 MED ORDER — HEPARIN 6000 UNIT IRRIGATION SOLUTION
Status: DC | PRN
  Administered 2023-12-06: 1

## 2023-12-06 MED ORDER — CEFAZOLIN SODIUM-DEXTROSE 2-4 GM/100ML-% IV SOLN
INTRAVENOUS | Status: AC
  Filled 2023-12-06: qty 100

## 2023-12-06 MED ORDER — FENTANYL CITRATE (PF) 250 MCG/5ML IJ SOLN
INTRAMUSCULAR | Status: AC
  Filled 2023-12-06: qty 5

## 2023-12-06 MED ORDER — CHLORHEXIDINE GLUCONATE 0.12 % MT SOLN
OROMUCOSAL | Status: AC
  Administered 2023-12-06: 15 mL via OROMUCOSAL
  Filled 2023-12-06: qty 15

## 2023-12-06 MED ORDER — LIDOCAINE 2% (20 MG/ML) 5 ML SYRINGE
INTRAMUSCULAR | Status: DC | PRN
  Administered 2023-12-06: 60 mg via INTRAVENOUS

## 2023-12-06 SURGICAL SUPPLY — 59 items
ARMBAND PINK RESTRICT EXTREMIT (MISCELLANEOUS) ×2 IMPLANT
BAG BANDED W/RUBBER/TAPE 36X54 (MISCELLANEOUS) IMPLANT
BAG DECANTER FOR FLEXI CONT (MISCELLANEOUS) ×2 IMPLANT
BENZOIN TINCTURE PRP APPL 2/3 (GAUZE/BANDAGES/DRESSINGS) ×2 IMPLANT
BIOPATCH RED 1 DISK 7.0 (GAUZE/BANDAGES/DRESSINGS) ×2 IMPLANT
CANISTER SUCTION 3000ML PPV (SUCTIONS) ×2 IMPLANT
CANNULA VESSEL 3MM 2 BLNT TIP (CANNULA) ×2 IMPLANT
CATH PALINDROME-P 19CM W/VT (CATHETERS) IMPLANT
CATH PALINDROME-P 23 W/VT (CATHETERS) IMPLANT
CATH PALINDROME-P 28CM W/VT (CATHETERS) IMPLANT
CHLORAPREP W/TINT 26 (MISCELLANEOUS) ×2 IMPLANT
CLIP LIGATING EXTRA MED SLVR (CLIP) ×2 IMPLANT
CLIP LIGATING EXTRA SM BLUE (MISCELLANEOUS) ×2 IMPLANT
COVER DOME SNAP 22 D (MISCELLANEOUS) IMPLANT
COVER PROBE W GEL 5X96 (DRAPES) ×2 IMPLANT
COVER SURGICAL LIGHT HANDLE (MISCELLANEOUS) ×2 IMPLANT
DERMABOND ADVANCED .7 DNX12 (GAUZE/BANDAGES/DRESSINGS) IMPLANT
DRAPE C-ARM 42X72 X-RAY (DRAPES) ×2 IMPLANT
DRAPE CHEST BREAST 15X10 FENES (DRAPES) ×2 IMPLANT
DRSG TEGADERM 4X4.5 CHG (GAUZE/BANDAGES/DRESSINGS) IMPLANT
ELECTRODE REM PT RTRN 9FT ADLT (ELECTROSURGICAL) ×2 IMPLANT
GAUZE 4X4 16PLY ~~LOC~~+RFID DBL (SPONGE) ×2 IMPLANT
GAUZE SPONGE 4X4 12PLY STRL (GAUZE/BANDAGES/DRESSINGS) ×2 IMPLANT
GLIDEWIRE ADV .035X180CM (WIRE) IMPLANT
GLOVE BIO SURGEON STRL SZ8 (GLOVE) ×2 IMPLANT
GOWN STRL REUS W/ TWL LRG LVL3 (GOWN DISPOSABLE) ×4 IMPLANT
GOWN STRL REUS W/ TWL XL LVL3 (GOWN DISPOSABLE) ×2 IMPLANT
GRAFT GORETEX STRT 4-7X45 (Vascular Products) IMPLANT
INSERT FOGARTY SM (MISCELLANEOUS) IMPLANT
KIT BASIN OR (CUSTOM PROCEDURE TRAY) ×2 IMPLANT
KIT PALINDROME-P 55CM (CATHETERS) IMPLANT
KIT TURNOVER KIT B (KITS) ×2 IMPLANT
LOOP VESSEL MINI RED (MISCELLANEOUS) IMPLANT
NDL 18GX1X1/2 (RX/OR ONLY) (NEEDLE) ×2 IMPLANT
NDL HYPO 25GX1X1/2 BEV (NEEDLE) ×2 IMPLANT
NEEDLE 18GX1X1/2 (RX/OR ONLY) (NEEDLE) ×2 IMPLANT
NEEDLE HYPO 25GX1X1/2 BEV (NEEDLE) ×2 IMPLANT
NS IRRIG 1000ML POUR BTL (IV SOLUTION) ×2 IMPLANT
PACK CV ACCESS (CUSTOM PROCEDURE TRAY) ×2 IMPLANT
PACK SRG BSC III STRL LF ECLPS (CUSTOM PROCEDURE TRAY) ×2 IMPLANT
PAD ARMBOARD POSITIONER FOAM (MISCELLANEOUS) ×4 IMPLANT
SET MICROPUNCTURE 5F STIFF (MISCELLANEOUS) ×2 IMPLANT
SLING ARM FOAM STRAP LRG (SOFTGOODS) IMPLANT
SOAP 2 % CHG 4 OZ (WOUND CARE) ×2 IMPLANT
STRIP CLOSURE SKIN 1/2X4 (GAUZE/BANDAGES/DRESSINGS) ×2 IMPLANT
SUT ETHILON 3 0 PS 1 (SUTURE) ×2 IMPLANT
SUT MNCRL AB 4-0 PS2 18 (SUTURE) ×2 IMPLANT
SUT PROLENE 6 0 BV (SUTURE) ×2 IMPLANT
SUT VIC AB 3-0 SH 27X BRD (SUTURE) ×2 IMPLANT
SYR 10ML LL (SYRINGE) ×2 IMPLANT
SYR 20ML LL LF (SYRINGE) ×4 IMPLANT
SYR 3ML LL SCALE MARK (SYRINGE) IMPLANT
SYR 5ML LL (SYRINGE) ×2 IMPLANT
SYR CONTROL 10ML LL (SYRINGE) ×2 IMPLANT
TOWEL GREEN STERILE (TOWEL DISPOSABLE) ×2 IMPLANT
TOWEL GREEN STERILE FF (TOWEL DISPOSABLE) ×4 IMPLANT
UNDERPAD 30X36 HEAVY ABSORB (UNDERPADS AND DIAPERS) ×2 IMPLANT
WATER STERILE IRR 1000ML POUR (IV SOLUTION) ×2 IMPLANT
WIRE BENTSON .035X145CM (WIRE) IMPLANT

## 2023-12-06 NOTE — Anesthesia Preprocedure Evaluation (Addendum)
 Anesthesia Evaluation  Patient identified by MRN, date of birth, ID band Patient awake    Reviewed: Allergy & Precautions, NPO status , Patient's Chart, lab work & pertinent test results  Airway Mallampati: III  TM Distance: >3 FB Neck ROM: Limited    Dental  (+) Dental Advisory Given, Edentulous Upper, Partial Lower, Poor Dentition, Missing   Pulmonary shortness of breath and with exertion   Pulmonary exam normal breath sounds clear to auscultation       Cardiovascular hypertension, Pt. on medications and Pt. on home beta blockers +CHF  (-) Orthopnea Normal cardiovascular exam Rhythm:Regular Rate:Normal  Echo 2022  1. Left ventricular ejection fraction, by estimation, is 60 to 65%. The left ventricle has normal function. The left ventricle has no regional wall motion abnormalities. There is mild left ventricular hypertrophy. Left ventricular diastolic parameters are consistent with Grade I diastolic dysfunction (impaired relaxation).   2. Right ventricular systolic function is normal. The right ventricular size is normal.   3. The mitral valve is normal in structure. No evidence of mitral valve regurgitation. No evidence of mitral stenosis.   4. The aortic valve is normal in structure. Aortic valve regurgitation is not visualized. No aortic stenosis is present.   5. The inferior vena cava is normal in size with greater than 50% respiratory variability, suggesting right atrial pressure of 3 mmHg.     Neuro/Psych  Neuromuscular disease  negative psych ROS   GI/Hepatic negative GI ROS, Neg liver ROS,neg GERD  ,,Stage 3 CRC s/p lap colectomy 2022  Obstructive jaundice Possibly due to malignancy Imaging suggestive of CBD obstruction     Endo/Other  diabetes, Well Controlled, Type 2    Renal/GU Renal InsufficiencyRenal disease     Musculoskeletal  (+) Arthritis , Osteoarthritis,    Abdominal   Peds  Hematology  (+) Blood  dyscrasia, anemia Hb 6.7 this AM, s/p 1 unit PRBC istat Hb 9   Anesthesia Other Findings   Reproductive/Obstetrics                              Anesthesia Physical Anesthesia Plan  ASA: 3  Anesthesia Plan: General   Post-op Pain Management: Tylenol  PO (pre-op)*   Induction: Intravenous  PONV Risk Score and Plan: 2 and Ondansetron , Dexamethasone , Midazolam  and Treatment may vary due to age or medical condition  Airway Management Planned: Oral ETT and LMA  Additional Equipment: None  Intra-op Plan:   Post-operative Plan: Extubation in OR  Informed Consent:   Plan Discussed with:   Anesthesia Plan Comments:         Anesthesia Quick Evaluation

## 2023-12-06 NOTE — Discharge Instructions (Signed)
 [facility]  Discharge Instructions  AV Fistula or Graft Surgery for Dialysis Access  Please refer to the following instructions for your post-procedure care. Your surgeon or physician assistant will discuss any changes with you.  Activity  You may drive the day following your surgery, if you are comfortable and no longer taking prescription pain medication. Resume full activity as the soreness in your incision resolves.  Bathing/Showering  You may shower after you go home. Keep your incision dry for 48 hours. Do not soak in a bathtub, hot tub, or swim until the incision heals completely. You may not shower if you have a hemodialysis catheter.  Incision Care  Clean your incision with mild soap and water after 48 hours. Pat the area dry with a clean towel. You do not need a bandage unless otherwise instructed. Do not apply any ointments or creams to your incision. You may have skin glue on your incision. Do not peel it off. It will come off on its own in about one week. Your arm may swell a bit after surgery. To reduce swelling use pillows to elevate your arm so it is above your heart. Your doctor will tell you if you need to lightly wrap your arm with an ACE bandage.  Diet  Resume your normal diet. There are not special food restrictions following this procedure. In order to heal from your surgery, it is CRITICAL to get adequate nutrition. Your body requires vitamins, minerals, and protein. Vegetables are the best source of vitamins and minerals. Vegetables also provide the perfect balance of protein. Processed food has little nutritional value, so try to avoid this.  Medications  Resume taking all of your medications. If your incision is causing pain, you may take over-the counter pain relievers such as acetaminophen  (Tylenol ). If you were prescribed a stronger pain medication, please be aware these medications can cause nausea and constipation. Prevent  nausea by taking the medication with a snack or meal. Avoid constipation by drinking plenty of fluids and eating foods with high amount of fiber, such as fruits, vegetables, and grains.  Do not take Tylenol  if you are taking prescription pain medications.  Follow up Your surgeon may want to see you in the office following your access surgery. If so, this will be arranged at the time of your surgery.  Please call us  immediately for any of the following conditions:  Increased pain, redness, drainage (pus) from your incision site Fever of 101 degrees or higher Severe or worsening pain at your incision site Hand pain or numbness.  Reduce your risk of vascular disease:  Stop smoking. If you would like help, call [facility] at 1-800-QUIT-NOW (778-034-5173) or [place] at (931)830-6760  Manage your cholesterol Maintain a desired weight Control your diabetes Keep your blood pressure down  Dialysis  It will take several weeks to several months for your new dialysis access to be ready for use. Your surgeon will determine when it is okay to use it. Your nephrologist will continue to direct your dialysis. You can continue to use your Permcath until your new access is ready for use.   12/06/2023 Jeffrey Young [id number] 01/01/1966  Surgeon(s): [name] [name], MD  Procedure(s): INSERTION, GRAFT, ARTERIOVENOUS, UPPER EXTREMITY EXCHANGE OF DIALYSIS CATHETER USING PALINDROME 23CM CATHETER KIT   May stick graft immediately   May stick graft on designated area only:   x Do not stick graft for 6 weeks    If you have any  questions, please call the office at [phone].

## 2023-12-06 NOTE — Anesthesia Procedure Notes (Signed)
 Procedure Name: LMA Insertion Date/Time: 12/06/2023 11:23 AM  Performed by: Scherrie Mast, CRNAPre-anesthesia Checklist: Patient identified, Emergency Drugs available, Suction available and Patient being monitored Patient Re-evaluated:Patient Re-evaluated prior to induction Oxygen Delivery Method: Circle System Utilized Preoxygenation: Pre-oxygenation with 100% oxygen Induction Type: IV induction Ventilation: Mask ventilation without difficulty LMA: LMA inserted LMA Size: 5.0 Number of attempts: 1 Airway Equipment and Method: Bite block Placement Confirmation: positive ETCO2 Tube secured with: Tape Dental Injury: Teeth and Oropharynx as per pre-operative assessment

## 2023-12-06 NOTE — Interval H&P Note (Signed)
 History and Physical Interval Note:  12/06/2023 10:35 AM  Jeffrey Young  has presented today for surgery, with the diagnosis of ESRD.  The various methods of treatment have been discussed with the patient and family. After consideration of risks, benefits and other options for treatment, the patient has consented to  Procedure(s): ARTERIOVENOUS (AV) FISTULA CREATION (Right) INSERTION, GRAFT, ARTERIOVENOUS, UPPER EXTREMITY (Right) INSERTION OF DIALYSIS CATHETER (N/A) as a surgical intervention.  The patient's history has been reviewed, patient examined, no change in status, stable for surgery.  I have reviewed the patient's chart and labs.  Questions were answered to the patient's satisfaction.     Debby LOISE Robertson

## 2023-12-06 NOTE — Anesthesia Postprocedure Evaluation (Signed)
 Anesthesia Post Note  Patient: Jeffrey Young  Procedure(s) Performed: INSERTION, GRAFT, ARTERIOVENOUS, UPPER EXTREMITY (Right) EXCHANGE OF DIALYSIS CATHETER USING PALINDROME 23CM CATHETER KIT (Right: Chest)     Patient location during evaluation: PACU Anesthesia Type: General Level of consciousness: sedated and patient cooperative Pain management: pain level controlled Vital Signs Assessment: post-procedure vital signs reviewed and stable Respiratory status: spontaneous breathing Cardiovascular status: stable Anesthetic complications: no   There were no known notable events for this encounter.  Last Vitals:  Vitals:   12/06/23 1400 12/06/23 1415  BP: 122/66 124/64  Pulse: 65 65  Resp: 12 10  Temp:  37.2 C  SpO2: 98% 98%    Last Pain:  Vitals:   12/06/23 1415  TempSrc:   PainSc: 0-No pain                 Norleen Pope

## 2023-12-06 NOTE — Op Note (Signed)
 DATE OF SERVICE: 12/06/2023  PATIENT:  Jeffrey Young  58 y.o. male  PRE-OPERATIVE DIAGNOSIS:  ESRD  POST-OPERATIVE DIAGNOSIS:  Same  PROCEDURE:   1) tunneled dialysis catheter exchange (CPT [phone]) 2) right upper arm brachial - axillary graft (CPT [phone])   SURGEON:  Surgeons and Role:    * [name] [name], MD - Primary  ASSISTANT: Ahmed Holster, PA-C  An experienced assistant was required given the complexity of this procedure and the standard of surgical care. My assistant helped with exposure through counter tension, suctioning, ligation and retraction to better visualize the surgical field.  My assistant expedited sewing during the case by following my sutures. Wherever I use the term we in the report, my assistant actively helped me with that portion of the procedure.  ANESTHESIA:   general  EBL: minimal  BLOOD ADMINISTERED:none  DRAINS: none   LOCAL MEDICATIONS USED:  NONE  SPECIMEN:  none  COUNTS: confirmed correct.  TOURNIQUET:  none  PATIENT DISPOSITION:  PACU - hemodynamically stable.   Delay start of Pharmacological VTE agent (>24hrs) due to surgical blood loss or risk of bleeding: no  INDICATION FOR PROCEDURE: Jeffrey Young is a 58 y.o. male with ESRD in need of permanent dialysis access. After careful discussion of risks, benefits, and alternatives the patient was offered right arm AV access and TDC exchange. The patient understood and wished to proceed.  OPERATIVE FINDINGS: unremarkable TDC exchange. No suitable vein for AVF. AVG placed without difficulty.   DESCRIPTION OF PROCEDURE: After identification of the patient in the pre-operative holding area, the patient was transferred to the operating room. The patient was positioned supine on the operating room table. Anesthesia was induced. The right neck, chest, were prepped and draped in standard fashion. A surgical pause was performed confirming correct patient, procedure, and operative location.  A  Glidewire advantage was navigated through the existing [facility] into the inferior vena cava.  The [facility] was freed from the subcutaneous attachments and suture material holding the [facility] in place was cut.  The [facility] was removed over the wire.  A new 23 cm TDC was delivered over the wire with stylette's into the SVC/RA.  The stylets were easily removed.  The catheter was tested and found to function properly.  The catheter was sutured to the chest wall with 2-0 nylon suture.  The catheter was heparinized and capped.  Dermabond was applied to the catheter exit site.  The right brachial artery was exposed using a longitudinal incision in the distal arm just above the antecubital fossa.  Incision was carried down through subcutaneous tissue until the brachial sheath was encountered.  This was incised sharply.  The brachial artery was exposed and encircled with Silastic Vesseloops proximally and distally to the site of planned inflow.   The right axillary vein at the axilla was exposed using longitudinal incision just below the hairbearing area of the axilla.  Incision was carried down until the brachial sheath was encountered.  The brachial vein was identified, exposed, encircled with Silastic Vesseloops.   Using a curved, sheathed tunneling device, a 4-7 mm tapered Gore-Tex graft was tunneled subcutaneously and gentle arc across the biceps of the right arm.  Patient was then heparinized with 5000 units of IV heparin .   The brachial artery was clamped proximally and distally.  An anterior arteriotomy was made with an 11 blade.  This was extended with Potts scissors.  The 4 mm end of the Gore-Tex graft was spatulated and then anastomosed end-to-side  to the brachial arteriotomy using continuous running suture of 6-0 Prolene.  The anastomosis was completed and hemostasis ensured.  The graft was clamped to restore perfusion to the hand.   The brachial vein was clamped proximally and distally.  An anterior venotomy was made  with an 11 blade.  This was extended with Potts scissors.  The 7 mm end of the Gore-Tex graft was then anastomosed end to side to the brachial vein venotomy using continuous running suture of 6-0 Prolene.  Immediately prior to completion the anastomosis was de-aired and flushed.  Anastomosis was then completed.  Hemostasis was insured.   Doppler machine was brought onto the field to interrogate the graft. Doppler flow was noted in the radial artery.  About the arterial anastomosis flow was noted proximal and distal to the arterial anastomosis.  Distal to the venous anastomosis a Doppler bruit was heard.  Satisfied we ended the case here.   Surgical beds were irrigated copiously.  Hemostasis was again ensured in the surgical beds.  The wounds were closed in layers using 3-0 Vicryl and 4-0 Monocryl.  Clean bandages were applied.  Upon completion of the case instrument and sharps counts were confirmed correct. The patient was transferred to the PACU in good condition. I was present for all portions of the procedure.  FOLLOW UP PLAN: Assuming a normal postoperative course, VVS PA will see the patient in 4 weeks with AVG duplex.   [name]. [name], MD [facility] Phone Number: [phone] 12/06/2023 1:17 PM

## 2023-12-06 NOTE — Transfer of Care (Signed)
 Immediate Anesthesia Transfer of Care Note  Patient: Jeffrey Young  Procedure(s) Performed: INSERTION, GRAFT, ARTERIOVENOUS, UPPER EXTREMITY (Right) EXCHANGE OF DIALYSIS CATHETER USING PALINDROME 23CM CATHETER KIT (Right: Chest)  Patient Location: PACU  Anesthesia Type:General  Level of Consciousness: awake, alert , and oriented  Airway & Oxygen Therapy: Patient Spontanous Breathing and Patient connected to nasal cannula oxygen  Post-op Assessment: Report given to RN and Post -op Vital signs reviewed and stable  Post vital signs: Reviewed and stable  Last Vitals:  Vitals Value Taken Time  BP 128/66 12/06/23 13:24  Temp 36 1324  Pulse 66 12/06/23 13:29  Resp 14 12/06/23 13:30  SpO2 99 % 12/06/23 13:29  Vitals shown include unfiled device data.  Last Pain:  Vitals:   12/06/23 0920  TempSrc: Oral         Complications: No notable events documented.

## 2023-12-07 ENCOUNTER — Encounter (HOSPITAL_COMMUNITY): Payer: Self-pay | Admitting: Vascular Surgery

## 2023-12-10 ENCOUNTER — Other Ambulatory Visit: Payer: Self-pay

## 2023-12-10 DIAGNOSIS — N186 End stage renal disease: Secondary | ICD-10-CM

## 2023-12-14 ENCOUNTER — Telehealth: Payer: Self-pay | Admitting: *Deleted

## 2023-12-14 ENCOUNTER — Encounter: Payer: Self-pay | Admitting: Oncology

## 2023-12-14 NOTE — Telephone Encounter (Signed)
 Called Mrs. Earnest to f/u on Kharson and if he is ready to see provider and get started on treatment again. She reports he started dialysis 3 weeks ago at the [facility] on MWF. Appointments are at 12:20 and lasts 4 hours. She said he is still pretty weak and sleeps a lot. Agrees to come in on a Tues or Thursday to see provider and potentially start treatment in another 3 weeks. Scheduling message sent.

## 2023-12-15 ENCOUNTER — Other Ambulatory Visit: Payer: Self-pay

## 2023-12-17 ENCOUNTER — Encounter: Payer: Self-pay | Admitting: Oncology

## 2023-12-17 NOTE — Progress Notes (Signed)
 error

## 2023-12-24 ENCOUNTER — Encounter: Payer: Self-pay | Admitting: Oncology

## 2023-12-25 ENCOUNTER — Ambulatory Visit: Attending: Vascular Surgery | Admitting: Physician Assistant

## 2023-12-25 ENCOUNTER — Ambulatory Visit (HOSPITAL_COMMUNITY)
Admission: RE | Admit: 2023-12-25 | Discharge: 2023-12-25 | Disposition: A | Discharge status: Home / Self Care | Source: Ambulatory Visit | Attending: Vascular Surgery | Admitting: Vascular Surgery

## 2023-12-25 VITALS — BP 145/80 | HR 70 | Temp 97.8°F | Wt 166.6 lb

## 2023-12-25 DIAGNOSIS — N186 End stage renal disease: Secondary | ICD-10-CM | POA: Diagnosis present

## 2023-12-25 NOTE — Progress Notes (Unsigned)
 POST OPERATIVE OFFICE NOTE    CC:  F/u for surgery  HPI:  Boen Sterbenz is a 58 y.o. male who is here for postop visit.  He recently underwent insertion of a right upper arm AV graft on 12/06/2023 by Dr. [name].  This was done for permanent dialysis access.  He returns today for follow-up.  He says that he is doing well.  He denies any issues with his right arm incisions such as drainage, dehiscence, or tenderness.  He denies any fevers or chills.  He denies any symptoms of steal in the right hand.  He currently dialyzes on Mondays, Wednesdays, and Fridays at the [facility] in [place].   Allergies  Allergen Reactions   Bee Venom Anaphylaxis, Swelling and Other (See Comments)    Cold Sweats, also   Latex Rash and Dermatitis    Current Outpatient Medications  Medication Sig Dispense Refill   baclofen  (LIORESAL ) 10 MG tablet Take 10 mg by mouth.     losartan (COZAAR) 50 MG tablet Take 25 mg by mouth.     ACCU-CHEK GUIDE test strip USE AS INSTRUCTED TO CHECK BLOOD SUGAR 2 TIMES DAILY     amLODipine  (NORVASC ) 10 MG tablet Take 10 mg by mouth daily.     carvedilol  (COREG ) 12.5 MG tablet Take 12.5 mg by mouth 2 (two) times daily with a meal.     Continuous Glucose Sensor (DEXCOM G6 SENSOR) MISC Inject 1 Device into the skin See admin instructions. Place a new sensor into the skin every 10 days     Continuous Glucose Transmitter (DEXCOM G6 TRANSMITTER) MISC SMARTSIG:Every 3 Months     LANTUS  SOLOSTAR 100 UNIT/ML Solostar Pen Inject 10-15 Units into the skin See admin instructions. Inject 15 units into the skin in the morning and 10 units at bedtime     loperamide (IMODIUM) 2 MG capsule Take 2 mg by mouth daily as needed for diarrhea or loose stools.     oxyCODONE  (ROXICODONE ) 5 MG immediate release tablet Take 1 tablet (5 mg total) by mouth every 6 (six) hours as needed for severe pain (pain score 7-10). 8 tablet 0   prochlorperazine  (COMPAZINE ) 10 MG tablet Take 1 tablet (10 mg total) by  mouth every 6 (six) hours as needed for nausea or vomiting. 60 tablet 1   No current facility-administered medications for this visit.   Facility-Administered Medications Ordered in Other Visits  Medication Dose Route Frequency Provider Last Rate Last Admin   sodium chloride  flush (NS) 0.9 % injection 10 mL  10 mL Intracatheter PRN Cloretta Arley NOVAK, MD   10 mL at 11/17/21 1153     ROS:  See HPI  Physical Exam:  Incision: Right arm incisions well-healed without signs of infection or hematoma Extremities: Palpable right radial pulse.  Right upper arm AV graft with good thrill Neuro: Intact motor and sensation of right hand, grip strength 5/5    Assessment/Plan:  This is a 58 y.o. male who is here for postop visit   - The patient recently underwent insertion of a right upper arm AV graft for permanent dialysis access.  His right arm incisions are well-healed without signs of infection -He denies any symptoms of steal in the right hand such as weakness, numbness, pain, or excessive coldness.  He has a palpable right radial pulse -On exam his right upper arm AV graft has a good thrill - His graft should be ready for access starting on 01/17/2024.  If his graft can be  successfully used for at least 2-3 dialysis sessions, his catheter can be removed.  He can call our office in the future to schedule outpatient TDC removal   Ahmed Holster, PA-C [facility] [phone]   Clinic MD:  [facility]

## 2023-12-26 NOTE — Progress Notes (Signed)
 Left voicemail regarding pre op phone call

## 2023-12-26 NOTE — Progress Notes (Incomplete Revision)
 COVID Vaccine Completed: yes  Date of COVID positive in last 90 days:  PCP - Alm Bilis, MD Cardiologist - Annabella Scarce, MD  Chest x-ray - 12/06/23 Epic EKG - 05/07/23 Epic Stress Test -  ECHO - 04/03/21 Epic Cardiac Cath -  Pacemaker/ICD device last checked: Spinal Cord Stimulator:  Bowel Prep -   Sleep Study -  CPAP -   Fasting Blood Sugar -  Checks Blood Sugar _____ times a day  Last dose of GLP1 agonist-  N/A GLP1 instructions:  Do not take after     Last dose of SGLT-2 inhibitors-  N/A SGLT-2 instructions:  Do not take after     Blood Thinner Instructions:  Last dose:   Time: Aspirin Instructions: Last Dose:  Activity level:  Can go up a flight of stairs and perform activities of daily living without stopping and without symptoms of chest pain or shortness of breath.  Able to exercise without symptoms  Unable to go up a flight of stairs without symptoms of     Anesthesia review: CHF, HTN, DM2, ESRD dialysis MWF, sepsis, SOB  Patient denies shortness of breath, fever, cough and chest pain at PAT appointment  Patient verbalized understanding of instructions that were given to them at the PAT appointment. Patient was also instructed that they will need to review over the PAT instructions again at home before surgery.

## 2023-12-31 ENCOUNTER — Encounter (HOSPITAL_COMMUNITY): Payer: Self-pay

## 2023-12-31 ENCOUNTER — Other Ambulatory Visit: Payer: Self-pay

## 2023-12-31 ENCOUNTER — Inpatient Hospital Stay (HOSPITAL_COMMUNITY)
Admission: EM | Admit: 2023-12-31 | Discharge: 2024-01-03 | DRG: 919 | Disposition: A | Discharge status: Home / Self Care | Attending: Internal Medicine | Admitting: Internal Medicine

## 2023-12-31 DIAGNOSIS — Y732 Prosthetic and other implants, materials and accessory gastroenterology and urology devices associated with adverse incidents: Secondary | ICD-10-CM | POA: Diagnosis present

## 2023-12-31 DIAGNOSIS — E1141 Type 2 diabetes mellitus with diabetic mononeuropathy: Secondary | ICD-10-CM | POA: Diagnosis present

## 2023-12-31 DIAGNOSIS — I152 Hypertension secondary to endocrine disorders: Secondary | ICD-10-CM | POA: Diagnosis present

## 2023-12-31 DIAGNOSIS — T85590A Other mechanical complication of bile duct prosthesis, initial encounter: Secondary | ICD-10-CM | POA: Diagnosis not present

## 2023-12-31 DIAGNOSIS — K920 Hematemesis: Principal | ICD-10-CM

## 2023-12-31 DIAGNOSIS — Z860100 Personal history of colon polyps, unspecified: Secondary | ICD-10-CM

## 2023-12-31 DIAGNOSIS — Z91030 Bee allergy status: Secondary | ICD-10-CM

## 2023-12-31 DIAGNOSIS — N186 End stage renal disease: Secondary | ICD-10-CM | POA: Diagnosis present

## 2023-12-31 DIAGNOSIS — Z91040 Latex allergy status: Secondary | ICD-10-CM

## 2023-12-31 DIAGNOSIS — Z9049 Acquired absence of other specified parts of digestive tract: Secondary | ICD-10-CM

## 2023-12-31 DIAGNOSIS — G620 Drug-induced polyneuropathy: Secondary | ICD-10-CM | POA: Diagnosis present

## 2023-12-31 DIAGNOSIS — C186 Malignant neoplasm of descending colon: Secondary | ICD-10-CM | POA: Diagnosis present

## 2023-12-31 DIAGNOSIS — D649 Anemia, unspecified: Secondary | ICD-10-CM | POA: Diagnosis present

## 2023-12-31 DIAGNOSIS — E119 Type 2 diabetes mellitus without complications: Secondary | ICD-10-CM

## 2023-12-31 DIAGNOSIS — K209 Esophagitis, unspecified without bleeding: Secondary | ICD-10-CM | POA: Diagnosis present

## 2023-12-31 DIAGNOSIS — K831 Obstruction of bile duct: Secondary | ICD-10-CM | POA: Diagnosis present

## 2023-12-31 DIAGNOSIS — N2581 Secondary hyperparathyroidism of renal origin: Secondary | ICD-10-CM | POA: Diagnosis present

## 2023-12-31 DIAGNOSIS — I5032 Chronic diastolic (congestive) heart failure: Secondary | ICD-10-CM | POA: Diagnosis not present

## 2023-12-31 DIAGNOSIS — Z89432 Acquired absence of left foot: Secondary | ICD-10-CM

## 2023-12-31 DIAGNOSIS — K922 Gastrointestinal hemorrhage, unspecified: Secondary | ICD-10-CM | POA: Diagnosis present

## 2023-12-31 DIAGNOSIS — Z8679 Personal history of other diseases of the circulatory system: Secondary | ICD-10-CM

## 2023-12-31 DIAGNOSIS — Z794 Long term (current) use of insulin: Secondary | ICD-10-CM

## 2023-12-31 DIAGNOSIS — E876 Hypokalemia: Secondary | ICD-10-CM | POA: Diagnosis present

## 2023-12-31 DIAGNOSIS — E1122 Type 2 diabetes mellitus with diabetic chronic kidney disease: Secondary | ICD-10-CM | POA: Diagnosis present

## 2023-12-31 DIAGNOSIS — K2091 Esophagitis, unspecified with bleeding: Secondary | ICD-10-CM | POA: Diagnosis present

## 2023-12-31 DIAGNOSIS — Z992 Dependence on renal dialysis: Secondary | ICD-10-CM

## 2023-12-31 DIAGNOSIS — E785 Hyperlipidemia, unspecified: Secondary | ICD-10-CM | POA: Diagnosis present

## 2023-12-31 DIAGNOSIS — E11319 Type 2 diabetes mellitus with unspecified diabetic retinopathy without macular edema: Secondary | ICD-10-CM | POA: Diagnosis present

## 2023-12-31 DIAGNOSIS — Z79899 Other long term (current) drug therapy: Secondary | ICD-10-CM

## 2023-12-31 DIAGNOSIS — C7889 Secondary malignant neoplasm of other digestive organs: Secondary | ICD-10-CM | POA: Diagnosis present

## 2023-12-31 DIAGNOSIS — D62 Acute posthemorrhagic anemia: Secondary | ICD-10-CM | POA: Diagnosis present

## 2023-12-31 DIAGNOSIS — Z8249 Family history of ischemic heart disease and other diseases of the circulatory system: Secondary | ICD-10-CM

## 2023-12-31 DIAGNOSIS — C221 Intrahepatic bile duct carcinoma: Secondary | ICD-10-CM | POA: Diagnosis present

## 2023-12-31 DIAGNOSIS — R066 Hiccough: Secondary | ICD-10-CM

## 2023-12-31 DIAGNOSIS — E8809 Other disorders of plasma-protein metabolism, not elsewhere classified: Secondary | ICD-10-CM | POA: Diagnosis present

## 2023-12-31 DIAGNOSIS — D631 Anemia in chronic kidney disease: Secondary | ICD-10-CM | POA: Diagnosis present

## 2023-12-31 HISTORY — DX: Gastrointestinal hemorrhage, unspecified: K92.2

## 2023-12-31 HISTORY — DX: End stage renal disease: N18.6

## 2023-12-31 HISTORY — DX: End stage renal disease: Z99.2

## 2023-12-31 LAB — CBC WITH DIFFERENTIAL/PLATELET
Abs Immature Granulocytes: 0.08 K/uL — ABNORMAL HIGH (ref 0.00–0.07)
Basophils Absolute: 0 K/uL (ref 0.0–0.1)
Basophils Relative: 0 %
Eosinophils Absolute: 0.1 K/uL (ref 0.0–0.5)
Eosinophils Relative: 1 %
HCT: 23.7 % — ABNORMAL LOW (ref 39.0–52.0)
Hemoglobin: 7.7 g/dL — ABNORMAL LOW (ref 13.0–17.0)
Immature Granulocytes: 1 %
Lymphocytes Relative: 14 %
Lymphs Abs: 1.3 K/uL (ref 0.7–4.0)
MCH: 30.2 pg (ref 26.0–34.0)
MCHC: 32.5 g/dL (ref 30.0–36.0)
MCV: 92.9 fL (ref 80.0–100.0)
Monocytes Absolute: 0.8 K/uL (ref 0.1–1.0)
Monocytes Relative: 8 %
Neutro Abs: 6.8 K/uL (ref 1.7–7.7)
Neutrophils Relative %: 76 %
Platelets: 184 K/uL (ref 150–400)
RBC: 2.55 MIL/uL — ABNORMAL LOW (ref 4.22–5.81)
RDW: 14.1 % (ref 11.5–15.5)
WBC: 9.1 K/uL (ref 4.0–10.5)
nRBC: 0 % (ref 0.0–0.2)

## 2023-12-31 LAB — COMPREHENSIVE METABOLIC PANEL WITH GFR
ALT: 27 U/L (ref 0–44)
AST: 23 U/L (ref 15–41)
Albumin: 2.1 g/dL — ABNORMAL LOW (ref 3.5–5.0)
Alkaline Phosphatase: 399 U/L — ABNORMAL HIGH (ref 38–126)
Anion gap: 14 (ref 5–15)
BUN: 10 mg/dL (ref 6–20)
CO2: 28 mmol/L (ref 22–32)
Calcium: 8.1 mg/dL — ABNORMAL LOW (ref 8.9–10.3)
Chloride: 91 mmol/L — ABNORMAL LOW (ref 98–111)
Creatinine, Ser: 2.09 mg/dL — ABNORMAL HIGH (ref 0.61–1.24)
GFR, Estimated: 36 mL/min — ABNORMAL LOW (ref >60)
Glucose, Bld: 104 mg/dL — ABNORMAL HIGH (ref 70–99)
Potassium: 3.5 mmol/L (ref 3.5–5.1)
Sodium: 133 mmol/L — ABNORMAL LOW (ref 135–145)
Total Bilirubin: 6 mg/dL — ABNORMAL HIGH (ref 0.0–1.2)
Total Protein: 6.3 g/dL — ABNORMAL LOW (ref 6.5–8.1)

## 2023-12-31 LAB — TECHNOLOGIST SMEAR REVIEW

## 2023-12-31 LAB — I-STAT CHEM 8, ED
BUN: 10 mg/dL (ref 6–20)
Calcium, Ion: 1 mmol/L — ABNORMAL LOW (ref 1.15–1.40)
Chloride: 90 mmol/L — ABNORMAL LOW (ref 98–111)
Creatinine, Ser: 1.9 mg/dL — ABNORMAL HIGH (ref 0.61–1.24)
Glucose, Bld: 99 mg/dL (ref 70–99)
HCT: 25 % — ABNORMAL LOW (ref 39.0–52.0)
Hemoglobin: 8.5 g/dL — ABNORMAL LOW (ref 13.0–17.0)
Potassium: 3.1 mmol/L — ABNORMAL LOW (ref 3.5–5.1)
Sodium: 134 mmol/L — ABNORMAL LOW (ref 135–145)
TCO2: 30 mmol/L (ref 22–32)

## 2023-12-31 LAB — IRON AND TIBC
Iron: 108 ug/dL (ref 45–182)
Saturation Ratios: 63 % — ABNORMAL HIGH (ref 17.9–39.5)
TIBC: 172 ug/dL — ABNORMAL LOW (ref 250–450)
UIBC: 64 ug/dL

## 2023-12-31 LAB — MAGNESIUM: Magnesium: 1.7 mg/dL (ref 1.7–2.4)

## 2023-12-31 LAB — PROTIME-INR
INR: 1 (ref 0.8–1.2)
Prothrombin Time: 13.6 s (ref 11.4–15.2)

## 2023-12-31 LAB — FIBRINOGEN: Fibrinogen: >800 mg/dL — ABNORMAL HIGH (ref 210–475)

## 2023-12-31 LAB — CBG MONITORING, ED: Glucose-Capillary: 93 mg/dL (ref 70–99)

## 2023-12-31 LAB — FERRITIN: Ferritin: 1554 ng/mL — ABNORMAL HIGH (ref 24–336)

## 2023-12-31 LAB — POC OCCULT BLOOD, ED: Fecal Occult Bld: NEGATIVE

## 2023-12-31 LAB — APTT: aPTT: 30 s (ref 24–36)

## 2023-12-31 LAB — FOLATE: Folate: 3.8 ng/mL — ABNORMAL LOW (ref >5.9)

## 2023-12-31 LAB — LACTATE DEHYDROGENASE: LDH: 194 U/L — ABNORMAL HIGH (ref 98–192)

## 2023-12-31 MED ORDER — METOCLOPRAMIDE HCL 5 MG/ML IJ SOLN
10.0000 mg | Freq: Once | INTRAMUSCULAR | Status: AC
  Administered 2023-12-31 (×2): 10 mg via INTRAVENOUS
  Filled 2023-12-31: qty 2

## 2023-12-31 MED ORDER — ACETAMINOPHEN 650 MG RE SUPP
650.0000 mg | Freq: Four times a day (QID) | RECTAL | Status: DC | PRN
Start: 2023-12-31 — End: 2024-01-03

## 2023-12-31 MED ORDER — FENTANYL CITRATE PF 50 MCG/ML IJ SOSY: 25.0000 ug | PREFILLED_SYRINGE | INTRAMUSCULAR | Status: DC | PRN

## 2023-12-31 MED ORDER — PANTOPRAZOLE SODIUM 40 MG IV SOLR
40.0000 mg | Freq: Once | INTRAVENOUS | Status: AC
  Administered 2023-12-31 (×2): 40 mg via INTRAVENOUS
  Filled 2023-12-31: qty 10

## 2023-12-31 MED ORDER — INSULIN ASPART 100 UNIT/ML IJ SOLN
0.0000 [IU] | Freq: Four times a day (QID) | INTRAMUSCULAR | Status: DC
  Administered 2024-01-03: 1 [IU] via SUBCUTANEOUS

## 2023-12-31 MED ORDER — PANTOPRAZOLE SODIUM 40 MG IV SOLR
40.0000 mg | Freq: Two times a day (BID) | INTRAVENOUS | Status: DC
  Administered 2024-01-01 – 2024-01-03 (×9): 40 mg via INTRAVENOUS
  Filled 2023-12-31 (×5): qty 10

## 2023-12-31 MED ORDER — SODIUM CHLORIDE 0.9 % IV SOLN: 12.5000 mg | Freq: Four times a day (QID) | INTRAVENOUS | Status: DC | PRN

## 2023-12-31 MED ORDER — NALOXONE HCL 0.4 MG/ML IJ SOLN: 0.4000 mg | INTRAMUSCULAR | Status: DC | PRN

## 2023-12-31 MED ORDER — ONDANSETRON HCL 4 MG/2ML IJ SOLN: 4.0000 mg | Freq: Four times a day (QID) | INTRAMUSCULAR | Status: DC | PRN

## 2023-12-31 MED ORDER — DIPHENHYDRAMINE HCL 25 MG PO CAPS
25.0000 mg | ORAL_CAPSULE | Freq: Once | ORAL | Status: AC
  Administered 2023-12-31 (×2): 25 mg via ORAL
  Filled 2023-12-31: qty 1

## 2023-12-31 MED ORDER — ACETAMINOPHEN 325 MG PO TABS: 650.0000 mg | ORAL_TABLET | Freq: Four times a day (QID) | ORAL | Status: DC | PRN

## 2023-12-31 MED ORDER — MELATONIN 3 MG PO TABS: 3.0000 mg | ORAL_TABLET | Freq: Every evening | ORAL | Status: DC | PRN

## 2023-12-31 NOTE — ED Triage Notes (Signed)
 Pt from dialysis. C/O weakness. Denies SHOB/CP. Dialysis states his hbg went from 8 to 6. C/O vomiting blood. VSS. Axox4.

## 2023-12-31 NOTE — ED Provider Notes (Signed)
 [place] EMERGENCY DEPARTMENT AT [facility] Provider Note   CSN: [id number] Arrival date & time: 12/31/23  1733     Patient presents with: abnormal labs   Jeffrey Young is a 58 y.o. male.  {Add pertinent medical, surgical, social history, OB history to HPI:32947} HPI  Patient is a 58 year old male present emergency room today with complaints of hematemesis.  Seems that he has had daily hematemesis for        Prior to Admission medications   Medication Sig Start Date End Date Taking? Authorizing Provider  ACCU-CHEK GUIDE test strip USE AS INSTRUCTED TO CHECK BLOOD SUGAR 2 TIMES DAILY 10/18/20   [provider]  amLODipine  (NORVASC ) 10 MG tablet Take 10 mg by mouth daily. 10/04/23   [provider]  baclofen  (LIORESAL ) 10 MG tablet Take 10 mg by mouth. 12/21/23   [provider]  carvedilol  (COREG ) 12.5 MG tablet Take 12.5 mg by mouth 2 (two) times daily with a meal. 08/23/22   [provider]  Continuous Glucose Sensor (DEXCOM G6 SENSOR) MISC Inject 1 Device into the skin See admin instructions. Place a new sensor into the skin every 10 days    [provider]  Continuous Glucose Transmitter (DEXCOM G6 TRANSMITTER) MISC SMARTSIG:Every 3 Months 09/25/23   [provider]  LANTUS  SOLOSTAR 100 UNIT/ML Solostar Pen Inject 10-15 Units into the skin See admin instructions. Inject 15 units into the skin in the morning and 10 units at bedtime    [provider]  loperamide (IMODIUM) 2 MG capsule Take 2 mg by mouth daily as needed for diarrhea or loose stools.    [provider]  losartan (COZAAR) 50 MG tablet Take 25 mg by mouth. 12/24/23   [provider]  oxyCODONE  (ROXICODONE ) 5 MG immediate release tablet Take 1 tablet (5 mg total) by mouth every 6 (six) hours as needed for severe pain (pain score 7-10). 12/06/23   Elna, McKenzi P, PA-C  prochlorperazine  (COMPAZINE ) 10 MG tablet Take 1 tablet (10 mg  total) by mouth every 6 (six) hours as needed for nausea or vomiting. 10/11/20   Cloretta Arley NOVAK, MD    Allergies: Bee venom and Latex    Review of Systems  Updated Vital Signs BP 130/65 (BP Location: Left Arm)   Pulse 78   Temp 98.3 F (36.8 C) (Oral)   Resp 18   Ht 6' 1 (1.854 m)   Wt 75 kg   SpO2 98%   BMI 21.81 kg/m   Physical Exam  (all labs ordered are listed, but only abnormal results are displayed) Labs Reviewed  CBC WITH DIFFERENTIAL/PLATELET  COMPREHENSIVE METABOLIC PANEL WITH GFR  PROTIME-INR  APTT  I-STAT CHEM 8, ED  TYPE AND SCREEN    EKG: None  Radiology: No results found.  {Document cardiac monitor, telemetry assessment procedure when appropriate:32947} Procedures   Medications Ordered in the ED  pantoprazole  (PROTONIX ) injection 40 mg (has no administration in time range)    Clinical Course as of 12/31/23 1810  Mon Dec 31, 2023  1750 First hematemesis 2 weeks ago, has vomited daily over this period. Always tehre is blood.  No abd pain.  No pain.  Emesis occurs after eating. No pain associated.   [WF]  1755 2 tbs per emesis [WF]    Clinical Course User Index [WF] Neldon Hamp RAMAN, PA   {Click here for ABCD2, HEART and other calculators REFRESH Note before signing:1}  Medical Decision Making Risk Prescription drug management. Decision regarding hospitalization.   ***  {Document critical care time when appropriate  Document review of labs and clinical decision tools ie CHADS2VASC2, etc  Document your independent review of radiology images and any outside records  Document your discussion with family members, caretakers and with consultants  Document social determinants of health affecting pt's care  Document your decision making why or why not admission, treatments were needed:32947:::1}   Final diagnoses:  None    ED Discharge Orders     None

## 2023-12-31 NOTE — H&P (Signed)
 History and Physical      Jeffrey Young FMW:[id number] DOB: 1966/05/01 DOA: 12/31/2023; DOS: 12/31/2023  PCP: Pura Lenis, MD  Patient coming from: home   I have personally briefly reviewed patient's old medical records in [facility]  Chief Complaint: anemia  HPI: Jeffrey Young is a 58 y.o. male with medical history significant for end-stage renal disease on hemodialysis, Monday, Wednesday, Friday, colon cancer status post partial colectomy, not yet started on chemotherapy, chronic diastolic heart failure, type 2 diabetes mellitus, transmetatarsal amputation of the left foot, anemia of chronic disease associated baseline hemoglobin 7-9, who is admitted to [facility] on 12/31/2023 with acute upper gastrointestinal bleed after presenting from home to [facility] ED complaining of worsening anemia.   The patient attended his routine hemodialysis session this morning, at which time routine labs reflected interval decline in hemoglobin to 6.  This is relative to his history of anemia of chronic disease with baseline hemoglobin 7-9, and most recent prior hemoglobin data point noted to be 9.1 on 11/20/2023.  He was able to complete the totality of today's hemodialysis session, and appears to have received transfusion of 1 unit PRBC while at dialysis.  However, upon conclusion of dialysis, he was instructed to present to the emergency department for further evaluation management of his acute anemia superimposed on anemia of chronic disease.  The patient reports that he has been experiencing 1-2 daily episodes of hematemesis for the last 2 weeks.  He reports that this is associated with small amount of bright red blood in the emesis.  Denies any cyst abdominal discomfort.  No recent diarrhea, melena or hematochezia.  Denies any associated chest pain, shortness of breath, palpitations, diaphoresis, dizziness, presyncope, or syncope.  He is not on any blood thinners as an outpatient, including no  aspirin.  No history of alcohol abuse or recent alcohol consumption.  No history of esophageal varices nor any known history of chronic liver disease.  No routine use of NSAIDs.  He has a history of colon cancer status post partial colectomy, and is scheduled to start chemotherapy on 01/10/2024  His medical history is also notable for hospitalization for obstructive jaundice in December 2024 from 05/07/2023 to 05/22/2023.  Most recent prior liver enzymes were checked on 11/20/2023 and were notable for the following at that time: Total bilirubin 0.5, alkaline phosphatase 295, AST 16, ALT 11.  His preceding alkaline phosphatase in June 2025 was noted to be 183.  He follows with Dr. Rollin as his outpatient gastroenterologist, and is scheduled to undergo ERCP as an outpatient on 01/04/2024.      ED Course:  Vital signs in the ED were notable for the following: Afebrile; rates in the 70s to 80s; systolic pressures in the 130s and 60s; respiratory rate 17-21, oxygen saturation 98 to 100% on room air.  Labs were notable for the following: CMP was notable for the following: Sodium 133, relative to 133 on 12/06/2023, potassium 3.5, bicarbonate 28, BUN 10 compared to 12 on 12/06/2023, glucose 104, calcium  adjusted for mild hypoalbuminemia noted to be 9.6, avidin 2.1, total bilirubin 6.0, alkaline phosphatase 399, AST 23, ALT 27.  CBC notable for white cell count 9100, hemoglobin 7.7 compared to 9.1 on 11/20/2023.  INR 1.0, PTT 30.  Type and screen has been completed.  Fecal blood test was negative.  Per my interpretation, EKG in ED demonstrated the following: No EKG performed in the ED today.  Imaging in the ED, per corresponding formal radiology read,  was notable for the following: No imaging performed in the ED today.  EDP d/w Dr. Kristie of GI, who conveyed that Dr. Rollin will consult and see the pt in the AM.   While in the ED, the following were administered: Benadryl  25 mg p.o. x 1 dose, Reglan  10 mg IV x 1  dose, Protonix  40 mg IV x 1 dose.  Subsequently, the patient was admitted for further evaluation management of his acute upper gastrointestinal bleed complicated by acute anemia superimposed on anemia of chronic kidney disease, along with additional admission labs that were notable for acute hyperbilirubinemia.      Review of Systems: As per HPI otherwise 10 point review of systems negative.   Past Medical History:  Diagnosis Date   Arthritis    Hip   CHF (congestive heart failure) (HCC)    Colon cancer (HCC)    Diabetic mononeuropathy associated with type 2 diabetes mellitus (HCC) 03/21/2017   Diabetic retinopathy (HCC) 07/31/2020   Enthesopathy of ankle and tarsus 04/02/2009   Formatting of this note might be different from the original. 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 Type 2 Diabetes Mellitus - Uncomplicated, Uncontrolled    Past Surgical History:  Procedure Laterality Date   AMPUTATION Left 01/22/2018   Procedure: TRANSMETATARSAL AMPUTATION;  Surgeon: Harden Jerona GAILS, MD;  Location: [facility] OR;  Service: Orthopedics;  Laterality: Left;toes   BILIARY STENT PLACEMENT N/A 05/10/2023   Procedure: BILIARY STENT PLACEMENT;  Surgeon: Rollin Dover, MD;  Location: [facility];  Service: Gastroenterology;  Laterality: N/A;    BILIARY STENT PLACEMENT N/A 05/18/2023   Procedure: BILIARY STENT PLACEMENT;  Surgeon: Rollin Dover, MD;  Location: [facility];  Service: Gastroenterology;  Laterality: N/A;   BIOPSY  07/27/2020   Procedure: BIOPSY;  Surgeon: Rollin Dover, MD;  Location: [facility];  Service: Endoscopy;;   COLON RESECTION N/A 07/30/2020   Procedure: HAND ASSISTED LAPAROSCOPIC LEFT HEMI COLECTOMY;  Surgeon: Gladis Cough, MD;  Location: [facility];  Service: General;  Laterality: N/A;   COLONOSCOPY WITH PROPOFOL  N/A 05/24/2018   Procedure: COLONOSCOPY WITH PROPOFOL ;  Surgeon: Rollin Dover, MD;  Location: [facility];  Service: Endoscopy;  Laterality: N/A;   COLONOSCOPY WITH PROPOFOL  N/A 07/27/2020   Procedure: COLONOSCOPY WITH PROPOFOL ;  Surgeon: Rollin Dover, MD;  Location: [facility];  Service: Endoscopy;  Laterality: N/A;   DIALYSIS/PERMA CATHETER INSERTION N/A 11/19/2023   Procedure: DIALYSIS/PERMA CATHETER INSERTION;  Surgeon: [name] [name], MD;  Location: [facility];  Service: Cardiovascular;  Laterality: N/A;   ERCP N/A 05/10/2023   Procedure: ENDOSCOPIC RETROGRADE CHOLANGIOPANCREATOGRAPHY (ERCP);  Surgeon: Rollin Dover, MD;  Location: [facility];  Service: Gastroenterology;  Laterality: N/A;   ERCP N/A 05/18/2023   Procedure: ENDOSCOPIC RETROGRADE CHOLANGIOPANCREATOGRAPHY (ERCP);  Surgeon: Rollin Dover, MD;  Location: [facility];  Service: Gastroenterology;  Laterality: N/A;   ESOPHAGOGASTRODUODENOSCOPY Left 04/05/2021   Procedure: ESOPHAGOGASTRODUODENOSCOPY (  EGD);  Surgeon: Rollin Dover, MD;  Location: [facility];  Service: Endoscopy;  Laterality: Left;   ESOPHAGOGASTRODUODENOSCOPY N/A 05/10/2023   Procedure: ESOPHAGOGASTRODUODENOSCOPY (EGD);  Surgeon: Rollin Dover, MD;  Location: [facility];  Service: Gastroenterology;  Laterality: N/A;   EUS N/A 05/10/2023   Procedure: UPPER ENDOSCOPIC ULTRASOUND (EUS) LINEAR;  Surgeon: Rollin Dover, MD;  Location: [facility];  Service:  Gastroenterology;  Laterality: N/A;   FINE NEEDLE ASPIRATION N/A 05/10/2023   Procedure: FINE NEEDLE ASPIRATION (FNA) LINEAR;  Surgeon: Rollin Dover, MD;  Location: [facility];  Service: Gastroenterology;  Laterality: N/A;   INSERTION OF ARTERIOVENOUS (AV) ARTEGRAFT ARM Right 12/06/2023   Procedure: INSERTION, GRAFT, ARTERIOVENOUS, UPPER EXTREMITY;  Surgeon: [name] [name], MD;  Location: [facility] OR;  Service: Vascular;  Laterality: Right;   INSERTION OF DIALYSIS CATHETER Right 12/06/2023   Procedure: EXCHANGE OF DIALYSIS CATHETER USING PALINDROME 23CM CATHETER KIT;  Surgeon: [name] [name], MD;  Location: [facility] OR;  Service: Vascular;  Laterality: Right;   POLYPECTOMY  05/24/2018   Procedure: POLYPECTOMY;  Surgeon: Rollin Dover, MD;  Location: [facility];  Service: Endoscopy;;   PORTACATH PLACEMENT Left 08/24/2020   Procedure: INSERTION PORT-A-CATH;  Surgeon: Gladis Cough, MD;  Location: [facility];  Service: General;  Laterality: Left;  75/rm1   SPHINCTEROTOMY  05/10/2023   Procedure: ANNETT;  Surgeon: Rollin Dover, MD;  Location: [facility];  Service: Gastroenterology;;   ANNETT  05/18/2023   Procedure: ANNETT;  Surgeon: Rollin Dover, MD;  Location: [facility];  Service: Gastroenterology;;   CLEDA REMOVAL  05/18/2023   Procedure: STENT REMOVAL;  Surgeon: Rollin Dover, MD;  Location: [facility];  Service: Gastroenterology;;   SUBMUCOSAL TATTOO INJECTION  07/27/2020   Procedure: SUBMUCOSAL TATTOO INJECTION;  Surgeon: Rollin Dover, MD;  Location: [facility];  Service: Endoscopy;;    Social History:  reports that he has never smoked. He has never used smokeless tobacco. He reports current alcohol use of about 1.0 standard drink of alcohol per week. He reports that he does not use drugs.   Allergies  Allergen Reactions   Bee Venom Anaphylaxis, Swelling and Other (See Comments)    Cold Sweats, also   Latex Rash and Dermatitis    Family History  Problem  Relation Age of Onset   Hypertension Father     Family history reviewed and not pertinent    Prior to Admission medications   Medication Sig Start Date End Date Taking? Authorizing Provider  ACCU-CHEK GUIDE test strip USE AS INSTRUCTED TO CHECK BLOOD SUGAR 2 TIMES DAILY 10/18/20   [provider]  amLODipine  (NORVASC ) 10 MG tablet Take 10 mg by mouth daily. 10/04/23   [provider]  baclofen  (LIORESAL ) 10 MG tablet Take 10 mg by mouth. 12/21/23   [provider]  carvedilol  (COREG ) 12.5 MG tablet Take 12.5 mg by mouth 2 (two) times daily with a meal. 08/23/22   [provider]  Continuous Glucose Sensor (DEXCOM G6 SENSOR) MISC Inject 1 Device into the skin See admin instructions. Place a new sensor into the skin every 10 days    [provider]  Continuous Glucose Transmitter (DEXCOM G6 TRANSMITTER) MISC SMARTSIG:Every 3 Months 09/25/23   [provider]  LANTUS  SOLOSTAR 100 UNIT/ML Solostar Pen Inject 10-15 Units into the skin See admin instructions. Inject 15 units into the skin in the morning and 10 units at bedtime    [provider]  loperamide (IMODIUM) 2 MG capsule Take 2 mg by mouth daily as  needed for diarrhea or loose stools.    [provider]  losartan (COZAAR) 50 MG tablet Take 25 mg by mouth. 12/24/23   [provider]  oxyCODONE  (ROXICODONE ) 5 MG immediate release tablet Take 1 tablet (5 mg total) by mouth every 6 (six) hours as needed for severe pain (pain score 7-10). 12/06/23   Elna, McKenzi P, PA-C  prochlorperazine  (COMPAZINE ) 10 MG tablet Take 1 tablet (10 mg total) by mouth every 6 (six) hours as needed for nausea or vomiting. 10/11/20   Cloretta Arley NOVAK, MD     Objective    Physical Exam: Vitals:   12/31/23 1815 12/31/23 1830 12/31/23 1900 12/31/23 1915  BP: (!) 161/74 (!) 155/67 (!) 161/76 (!) 163/84  Pulse: 75 76 80 83  Resp: 20 17 20  (!) 21  Temp:      TempSrc:      SpO2: 100% 100% 100%  100%  Weight:      Height:        General: appears to be stated age; alert, oriented Skin: warm, dry, no rash Head:  AT/[place] Mouth:  Oral mucosa membranes appear moist, normal dentition Neck: supple; trachea midline Heart:  RRR; did not appreciate any M/R/G Lungs: CTAB, did not appreciate any wheezes, rales, or rhonchi Abdomen: + BS; soft, ND, NT Vascular: 2+ pedal pulses b/l; 2+ radial pulses b/l Extremities: no peripheral edema, no muscle wasting   Labs on Admission: I have personally reviewed following labs and imaging studies  CBC: Recent Labs  Lab 12/31/23 1806 12/31/23 1834  WBC 9.1  --   NEUTROABS 6.8  --   HGB 7.7* 8.5*  HCT 23.7* 25.0*  MCV 92.9  --   PLT 184  --    Basic Metabolic Panel: Recent Labs  Lab 12/31/23 1806 12/31/23 1834  NA 133* 134*  K 3.5 3.1*  CL 91* 90*  CO2 28  --   GLUCOSE 104* 99  BUN 10 10  CREATININE 2.09* 1.90*  CALCIUM  8.1*  --    GFR: Estimated Creatinine Clearance: 43.9 mL/min (A) (by C-G formula based on SCr of 1.9 mg/dL (H)). Liver Function Tests: Recent Labs  Lab 12/31/23 1806  AST 23  ALT 27  ALKPHOS 399*  BILITOT 6.0*  PROT 6.3*  ALBUMIN  2.1*   No results for input(s): LIPASE, AMYLASE in the last 168 hours. No results for input(s): AMMONIA in the last 168 hours. Coagulation Profile: Recent Labs  Lab 12/31/23 1806  INR 1.0   Cardiac Enzymes: No results for input(s): CKTOTAL, CKMB, CKMBINDEX, TROPONINI in the last 168 hours. BNP (last 3 results) No results for input(s): PROBNP in the last 8760 hours. HbA1C: No results for input(s): HGBA1C in the last 72 hours. CBG: No results for input(s): GLUCAP in the last 168 hours. Lipid Profile: No results for input(s): CHOL, HDL, LDLCALC, TRIG, CHOLHDL, LDLDIRECT in the last 72 hours. Thyroid Function Tests: No results for input(s): TSH, T4TOTAL, FREET4, T3FREE, THYROIDAB in the last 72 hours. Anemia Panel: No results for  input(s): VITAMINB12, FOLATE, FERRITIN, TIBC, IRON, RETICCTPCT in the last 72 hours. Urine analysis:    Component Value Date/Time   COLORURINE AMBER (A) 05/20/2023 1728   APPEARANCEUR CLEAR 05/20/2023 1728   LABSPEC 1.011 05/20/2023 1728   PHURINE 5.0 05/20/2023 1728   GLUCOSEU 150 (A) 05/20/2023 1728   HGBUR SMALL (A) 05/20/2023 1728   BILIRUBINUR NEGATIVE 05/20/2023 1728   KETONESUR NEGATIVE 05/20/2023 1728   PROTEINUR >=300 (A) 05/20/2023 1728  NITRITE NEGATIVE 05/20/2023 1728   LEUKOCYTESUR NEGATIVE 05/20/2023 1728    Radiological Exams on Admission: No results found.    Assessment/Plan   Principal Problem:   Acute upper GI bleed Active Problems:   DM2 (diabetes mellitus, type 2) (HCC)   Acute on chronic anemia   Hyperbilirubinemia   End-stage renal disease on hemodialysis (HCC)   Chronic diastolic CHF (congestive heart failure) (HCC)   History of essential hypertension       #) Acute upper GI bleed: Diagnosis on the basis of patient's report of 2 weeks of daily hematemesis, further detailed above, with new finding of acute anemia superimposed on his anemia of chronic disease, as further quantified above.  He is not on blood thinners as an outpatient, and appears hemodynamically stable.  No history of alcohol abuse on presentation appears less suggestive of esophageal variceal bleed given the patient's degree of clinical and hemodynamic stability.  He has no known history of underlying liver disease to warrant initiation of SBP prophylaxis, although it is noted that he presents with acute hyperbilirubinemia with worsening elevation in alkaline phosphatase.  While he has a history of colon cancer he has not yet started chemotherapy. probably, differential for his presenting acute upper gastrointestinal bleed includes esophagitis versus gastritis versus peptic ulcer disease vs portal gastropathy in the setting of interval development of  hyperbilirubinemia/worsening elevation in alkaline phosphatase. EDP d/w on-call, GI, Dr. Kristie, who conveys that Dr. Rollin will formally consult to the patient in the morning.   Plan: Gastroenterology to formally consult and see the patient morning.  NPO.  IV Protonix .  Monitor on telemetry.  Further evaluation management of presenting acute blood loss anemia superimposed on anemia of chronic disease, as below.  Repeat CMP, CBC in the morning.  Prn IV Zofran .  Prn IV iron for refractory nausea/vomiting.  Refrain from pharmacologic DVT prophylaxis.                  #) Acute anemia superimposed on anemia of chronic disease: Relative to the patient's history of anemia of chronic disease with baseline hemoglobin of 7 and 9 and most recent prior hemoglobin of 9.1 on 11/20/2023, the patient was noted at hemodialysis this morning to have hemoglobin of 6.0, which appears to have prompted transfusion 1 unit PRBC, with updated hemoglobin upon presentation to the emergency department this evening showing interval improvement to 7.7 and associated Neuraceq/Norocarp properties as well as nonelevated RDW.  In terms of contributions towards the acute element of his anemia, suspect contribution from acute upper gastrointestinal bleed given the patient's report of daily episodes of hematemesis over the course of the last 2 weeks.  However, his presenting labs also reflect interval elevation in total bilirubin to 6.0.  Given interval increase in alkaline phosphatase level, this may be on the basis of biliary obstruction, but will also evaluate for the possibility of a hemolytic contribution, as further detailed below.  DIC appears less likely given preservation of platelet count as well as INR/PTT that are found to be within normal limits.  He appears hemodynamically stable and asymptomatic as relates to his acute on chronic anemia.  Chemistries are completed.  No indication for additional PRBC transfusion at this  time.  Plan: Further evaluation management presenting acute upper GI bleed, including GI consultation, as above.  4-hour H&H's have been ordered through 9 AM on 01/01/2024.  Add on the following studies: Iron studies, B12 level, folic acid level, LDH, fibrinogen .  Technologist smear review ordered.  Checked direct bilirubin.  Repeat PTT, INR in the morning.  CMP, CBC in the morning.  Further evaluation management of resenting hyperbilirubinemia, as below.                   #) Hyperbilirubinemia: Presenting bilirubin 6.0 compared to most recent prior value of 0.5 on 11/20/2023.  This is in the setting of progressive increase in alkaline phosphatase, now up to 399.  AST and ALT are nonelevated.  Concerning for biliary obstruction, noting that INR/PTT within normal limits this suggestive of preservation of hepatic function.  Hemolytic anemia appears less likely, as further detailed above.  Will check direct bilirubin to further assess.  It is noted that he has a history of obstructive jaundice prompting admission in December 2024, as above, and is currently scheduled undergo outpatient ERCP with Dr. Rollin on 01/04/24. Will further assess for new biliary obstruction by pursuing CT abdomen/pelvis, as below.  Plan: GI to formally consult and see the patient in morning, as further detailed above.  Check GGT, direct bilirubin.  Assessment for hemolytic contribution by checking LDH, fibrinogen , technologist my review.  Repeat CMP, CBC in the morning.  CT abdomen/pelvis without contrast, in the setting of the patient's incisional disease.                    #) ESRD: on HD (schedule: Monday, Wednesday, and Friday).  Was able to complete the totality of his hemodialysis session earlier today.  Next due for routine HD on 01/02/2024. No clinical evidence for urgent overnight HD or to expedite HD relative to this timeframe.   Plan: monitor strict I's/O's, daily weights. CMP in the AM. Check  mag and phos levels.                           #) Chronic diastolic heart failure: documented history of such, with most recent echocardiogram performed in November 2022, which was notable for LVEF 60 to 65%, grade 1 diastolic dysfunction, normal right ventricular systolic function. No clinical evidence to suggest acutely decompensated heart failure at this time. home diuretic regimen reportedly consists of the following: None in the same status renal disease patient on hemodialysis.   Plan: monitor strict I's & O's and daily weights. Repeat CMP in AM. Check serum mag level.  Check BNP.                    #) Type 2 Diabetes Mellitus: documented history of such. Home insulin  regimen: Lantus  50 units SQ every morning as well as 10 units SQ nightly. Home oral hypoglycemic agents: None. presenting blood sugar: 104. Most recent A1c noted to be 5.4% when checked in December 2024.  For now, will be conservative with resumption of his basal insulin  given his end-stage renal disease on hemodialysis and current plan for n.p.o. status pending gastroenterology consultation in the morning.  Plan: Accu-Cheks every 6 hours with very low-dose sliding scale insulin .  Add on hemoglobin A1c level.  For now, hold home basal insulin .                     #) Essential Hypertension: documented h/o such, with outpatient antihypertensive regimen including Coreg , losartan, Norvasc .  SBP's in the ED today: 130s to 160s mmHg. however, in the setting of presenting acute upper gastrointestinal bleed complicated by acute blood loss anemia superimposed on anemia of chronic disease, will hold home antihypertensive medications for  now.  Plan: Close monitoring of subsequent BP via routine VS. hold home antihypertensive medications for now, as above.  Monitor strict I's and O's and daily weights.  Monitor on telemetry.       DVT prophylaxis: SCD's   Code Status: Full  code Family Communication: I discussed pt's case with his wife, who was present at bedside.  Disposition Plan: Per Rounding Team Consults called:  EDP d/w Dr. Kristie of GI, who conveyed that Dr. Rollin will consult and see the pt in the AM. ;  Admission status: Observation     I SPENT GREATER THAN 75  MINUTES IN CLINICAL CARE TIME/MEDICAL DECISION-MAKING IN COMPLETING THIS ADMISSION.      Yavonne Kiss B Evon Dejarnett DO [facility]  From 7PM - 7AM   12/31/2023, 7:55 PM

## 2023-12-31 NOTE — ED Provider Notes (Signed)
 [place] EMERGENCY DEPARTMENT AT [facility] Provider Note   CSN: [id number] Arrival date & time: 12/31/23  1733     Patient presents with: abnormal labs   Jeffrey Young is a 58 y.o. male.   HPI  Patient is a 58 year old male w hx of ESRD on HD MWF (last dialysis today), colon cancer s/p partial colectomy, CHF, DM2. Pt presents to emergency room today with complaints of hematemesis.  Seems that he has had daily hematemesis daily for 2 weeks.  Denies any abdominal pain or chest pain. Patient also with hiccups that have been ongoing for the past 2 weeks.  Has not had any hemoptysis or chest pain.  No fevers or chills.  No lightheadedness or dizziness.  He states that he came to ER today because he was told at dialysis that his hgb is 6.      Prior to Admission medications   Medication Sig Start Date End Date Taking? Authorizing Provider  amLODipine  (NORVASC ) 10 MG tablet Take 10 mg by mouth daily. 10/04/23  Yes [provider]  baclofen  (LIORESAL ) 10 MG tablet Take 10 mg by mouth. 12/21/23  Yes [provider]  carvedilol  (COREG ) 12.5 MG tablet Take 12.5 mg by mouth 2 (two) times daily with a meal. 08/23/22  Yes [provider]  LANTUS  SOLOSTAR 100 UNIT/ML Solostar Pen Inject 10-15 Units into the skin See admin instructions. Inject 15 units into the skin in the morning and 10 units into the skin at bedtime   Yes [provider]  loperamide (IMODIUM) 2 MG capsule Take 2 mg by mouth daily as needed for diarrhea or loose stools.   Yes [provider]  oxyCODONE  (ROXICODONE ) 5 MG immediate release tablet Take 1 tablet (5 mg total) by mouth every 6 (six) hours as needed for severe pain (pain score 7-10). 12/06/23  Yes Schuh, McKenzi P, PA-C  prochlorperazine  (COMPAZINE ) 10 MG tablet Take 1 tablet (10 mg total) by mouth every 6 (six) hours as needed for nausea or vomiting. 10/11/20  Yes Cloretta Arley NOVAK, MD  ACCU-CHEK GUIDE test strip USE AS  INSTRUCTED TO CHECK BLOOD SUGAR 2 TIMES DAILY 10/18/20   [provider]  Continuous Glucose Sensor (DEXCOM G6 SENSOR) MISC Inject 1 Device into the skin See admin instructions. Place a new sensor into the skin every 10 days    [provider]  Continuous Glucose Transmitter (DEXCOM G6 TRANSMITTER) MISC SMARTSIG:Every 3 Months 09/25/23   [provider]  losartan (COZAAR) 50 MG tablet Take 25 mg by mouth. 12/24/23   [provider]    Allergies: Bee venom and Latex    Review of Systems  Updated Vital Signs BP (!) 147/74   Pulse 78   Temp 99.5 F (37.5 C) (Oral)   Resp (!) 26   Ht 6' 1 (1.854 m)   Wt 75 kg   SpO2 100%   BMI 21.81 kg/m   Physical Exam Vitals and nursing note reviewed.  Constitutional:      General: He is not in acute distress. HENT:     Head: Normocephalic and atraumatic.     Nose: Nose normal.     Mouth/Throat:     Mouth: Mucous membranes are moist.  Eyes:     General: No scleral icterus. Cardiovascular:     Rate and Rhythm: Normal rate and regular rhythm.     Pulses: Normal pulses.     Heart sounds: Normal heart sounds.  Pulmonary:  Effort: Pulmonary effort is normal. No respiratory distress.     Breath sounds: No wheezing.  Abdominal:     Palpations: Abdomen is soft.     Tenderness: There is no abdominal tenderness. There is no guarding or rebound.     Comments: Hemoccult negative, normal rectal tone, RN at bedside to chaperone Normal color brown stool  Musculoskeletal:     Cervical back: Normal range of motion.     Right lower leg: No edema.     Left lower leg: No edema.  Skin:    General: Skin is warm and dry.     Capillary Refill: Capillary refill takes less than 2 seconds.     Comments: Right chest with hemodialysis port right upper arm with fistula with good thrill  Neurological:     Mental Status: He is alert. Mental status is at baseline.  Psychiatric:        Mood and Affect: Mood normal.         Behavior: Behavior normal.     (all labs ordered are listed, but only abnormal results are displayed) Labs Reviewed  CBC WITH DIFFERENTIAL/PLATELET - Abnormal; Notable for the following components:      Result Value   RBC 2.55 (*)    Hemoglobin 7.7 (*)    HCT 23.7 (*)    Abs Immature Granulocytes 0.08 (*)    All other components within normal limits  COMPREHENSIVE METABOLIC PANEL WITH GFR - Abnormal; Notable for the following components:   Sodium 133 (*)    Chloride 91 (*)    Glucose, Bld 104 (*)    Creatinine, Ser 2.09 (*)    Calcium  8.1 (*)    Total Protein 6.3 (*)    Albumin  2.1 (*)    Alkaline Phosphatase 399 (*)    Total Bilirubin 6.0 (*)    GFR, Estimated 36 (*)    All other components within normal limits  FERRITIN - Abnormal; Notable for the following components:   Ferritin 1,554 (*)    All other components within normal limits  IRON AND TIBC - Abnormal; Notable for the following components:   TIBC 172 (*)    Saturation Ratios 63 (*)    All other components within normal limits  FOLATE - Abnormal; Notable for the following components:   Folate 3.8 (*)    All other components within normal limits  LACTATE DEHYDROGENASE - Abnormal; Notable for the following components:   LDH 194 (*)    All other components within normal limits  FIBRINOGEN  - Abnormal; Notable for the following components:   Fibrinogen  >800 (*)    All other components within normal limits  I-STAT CHEM 8, ED - Abnormal; Notable for the following components:   Sodium 134 (*)    Potassium 3.1 (*)    Chloride 90 (*)    Creatinine, Ser 1.90 (*)    Calcium , Ion 1.00 (*)    Hemoglobin 8.5 (*)    HCT 25.0 (*)    All other components within normal limits  PROTIME-INR  APTT  MAGNESIUM   TECHNOLOGIST SMEAR REVIEW  CBC WITH DIFFERENTIAL/PLATELET  COMPREHENSIVE METABOLIC PANEL WITH GFR  MAGNESIUM   HEMOGLOBIN AND HEMATOCRIT, BLOOD  APTT  PROTIME-INR  VITAMIN B12  GAMMA GT  BILIRUBIN, DIRECT   PHOSPHORUS  BRAIN NATRIURETIC PEPTIDE  HEMOGLOBIN A1C  HEMOGLOBIN AND HEMATOCRIT, BLOOD  POC OCCULT BLOOD, ED  CBG MONITORING, ED  TYPE AND SCREEN    EKG: None  Radiology: No results found.   Procedures  Medications Ordered in the ED  acetaminophen  (TYLENOL ) tablet 650 mg (has no administration in time range)    Or  acetaminophen  (TYLENOL ) suppository 650 mg (has no administration in time range)  melatonin tablet 3 mg (has no administration in time range)  ondansetron  (ZOFRAN ) injection 4 mg (has no administration in time range)  pantoprazole  (PROTONIX ) injection 40 mg (has no administration in time range)  naloxone  (NARCAN ) injection 0.4 mg (has no administration in time range)  fentaNYL  (SUBLIMAZE ) injection 25 mcg (has no administration in time range)  insulin  aspart (novoLOG ) injection 0-6 Units ( Subcutaneous Not Given 12/31/23 2355)  promethazine  (PHENERGAN ) 12.5 mg in sodium chloride  0.9 % 50 mL IVPB (has no administration in time range)  pantoprazole  (PROTONIX ) injection 40 mg (40 mg Intravenous Given 12/31/23 1816)  metoCLOPramide  (REGLAN ) injection 10 mg (10 mg Intravenous Given 12/31/23 1903)  diphenhydrAMINE  (BENADRYL ) capsule 25 mg (25 mg Oral Given 12/31/23 1904)    Clinical Course as of 01/01/24 0046  Mon Dec 31, 2023  1750 First hematemesis 2 weeks ago, has vomited daily over this period. Always tehre is blood.  No abd pain.  No pain.  Emesis occurs after eating. No pain associated.   [WF]  1755 2 tbs per emesis [WF]    Clinical Course User Index [WF] Neldon Hamp RAMAN, [place]                                 Medical Decision Making Risk Prescription drug management. Decision regarding hospitalization.    This patient presents to the ED for concern of anemia, this involves a number of treatment options, and is a complaint that carries with it a high risk of complications and morbidity. A differential diagnosis was considered for the patient's symptoms  which is discussed below:   In the setting of hematemesis anemia is concerning for blood loss anemia.  In this particular case patient is microcytic hypochromic consistent with this.  Also worth considering that this may be continuation/worsening of his anemia of chronic disease with CKD. I feel less inclined to contribute this to a autoimmune/consumptive process, normal platelets.  I also feel that this is less likely to be due to GI bleed blood loss anemia as he had negative Hemoccult and has normal color stool.     Co morbidities: Discussed in HPI   Brief History:  Patient is a 58 year old male w hx of ESRD on HD MWF (last dialysis today), colon cancer s/p partial colectomy, CHF, DM2. Pt presents to emergency room today with complaints of hematemesis.  Seems that he has had daily hematemesis daily for 2 weeks.  Denies any abdominal pain or chest pain.  No fevers or chills.  No lightheadedness or dizziness.  He states that he came to ER today because he was told at dialysis that his hgb is 6.   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 Rollin.      EMR reviewed including pt PMHx, past surgical history and past visits to ER.   See HPI for more details   Lab Tests:   I ordered and independently interpreted labs. Labs notable for CMP with significant elevation in bilirubin from prior level of 0.5 to 6.0 today  Hemoglobin down to be 7.7 today.  No indication for acute transfusion.  Coags, anemia panel, occult stool also obtained.  Imaging Studies:  No imaging studies ordered for  this patient    Cardiac Monitoring:  NA NA   Medicines ordered:  I ordered medication including Protonix , Reglan , Benadryl  for nausea Reevaluation of the patient after these medicines showed that the patient improved I have reviewed the patients home medicines and have made adjustments as needed   Critical Interventions:     Consults/Attending  Physician   I requested consultation with Dr. Kristie of gastroenterology,  and discussed lab and imaging findings as well as pertinent plan - they recommend: Admission and will see patient in a.m.  Discussed with hospitalist who will admit.  Reevaluation:  After the interventions noted above I re-evaluated patient and found that they have :stayed the same   Social Determinants of Health:      Problem List / ED Course:  Acute anemia likely secondary to hematemesis CKD -hemodialysis completed today   Dispostion:  After consideration of the diagnostic results and the patients response to treatment, I feel that the patent would benefit from admission   Final diagnoses:  Hematemesis with nausea  Hiccups    ED Discharge Orders     None          Neldon Hamp RAMAN, [place] 01/01/24 0047    Cleotilde Rogue, MD 01/01/24 1259

## 2023-12-31 NOTE — ED Provider Triage Note (Signed)
 Emergency Medicine Provider Triage Evaluation Note  Jeffrey Young , a 58 y.o. male  was evaluated in triage.  Pt complains of abnormal labs.  I dialysis told him his hemoglobin from 8-6.  He reports he has been having some darker red vomit around 3-4 episodes per day for the past 6 days.  Denies any abdominal pain.  He denies any black or bloody stool.  He denies any chest pain or shortness of breath but does mention some fatigue.  Review of Systems  Positive:  Negative:   Physical Exam  BP 130/65 (BP Location: Left Arm)   Pulse 78   Temp 98.3 F (36.8 C) (Oral)   Resp 18   Ht 6' 1 (1.854 m)   Wt 75 kg   SpO2 98%   BMI 21.81 kg/m  Gen:   Awake, no distress   Resp:  Normal effort  MSK:   Moves extremities without difficulty  Other:  Abdomen soft and nontender.  Blisters present on the lips  Medical Decision Making  Medically screening exam initiated at 5:47 PM.  Appropriate orders placed.  Angelus Hoopes was informed that the remainder of the evaluation will be completed by another provider, this initial triage assessment does not replace that evaluation, and the importance of remaining in the ED until their evaluation is complete.  Patient does appear to have some blisters on lip.  Reports that this started on Saturday.  Abdomen soft and nontender.  Patient in no acute distress.  Labs ordered.   Bernis Ernst, [place] 12/31/23 [id number]

## 2024-01-01 ENCOUNTER — Observation Stay (HOSPITAL_COMMUNITY)

## 2024-01-01 ENCOUNTER — Encounter

## 2024-01-01 ENCOUNTER — Other Ambulatory Visit: Payer: Self-pay

## 2024-01-01 DIAGNOSIS — Z8249 Family history of ischemic heart disease and other diseases of the circulatory system: Secondary | ICD-10-CM | POA: Diagnosis not present

## 2024-01-01 DIAGNOSIS — T85590A Other mechanical complication of bile duct prosthesis, initial encounter: Secondary | ICD-10-CM | POA: Diagnosis present

## 2024-01-01 DIAGNOSIS — D62 Acute posthemorrhagic anemia: Secondary | ICD-10-CM | POA: Diagnosis present

## 2024-01-01 DIAGNOSIS — C186 Malignant neoplasm of descending colon: Secondary | ICD-10-CM | POA: Diagnosis present

## 2024-01-01 DIAGNOSIS — C7889 Secondary malignant neoplasm of other digestive organs: Secondary | ICD-10-CM | POA: Diagnosis present

## 2024-01-01 DIAGNOSIS — C221 Intrahepatic bile duct carcinoma: Secondary | ICD-10-CM | POA: Diagnosis present

## 2024-01-01 DIAGNOSIS — E8809 Other disorders of plasma-protein metabolism, not elsewhere classified: Secondary | ICD-10-CM | POA: Diagnosis present

## 2024-01-01 DIAGNOSIS — I5032 Chronic diastolic (congestive) heart failure: Secondary | ICD-10-CM | POA: Diagnosis present

## 2024-01-01 DIAGNOSIS — N184 Chronic kidney disease, stage 4 (severe): Secondary | ICD-10-CM | POA: Diagnosis not present

## 2024-01-01 DIAGNOSIS — D649 Anemia, unspecified: Secondary | ICD-10-CM | POA: Diagnosis not present

## 2024-01-01 DIAGNOSIS — Z89432 Acquired absence of left foot: Secondary | ICD-10-CM | POA: Diagnosis not present

## 2024-01-01 DIAGNOSIS — I152 Hypertension secondary to endocrine disorders: Secondary | ICD-10-CM | POA: Diagnosis present

## 2024-01-01 DIAGNOSIS — I130 Hypertensive heart and chronic kidney disease with heart failure and stage 1 through stage 4 chronic kidney disease, or unspecified chronic kidney disease: Secondary | ICD-10-CM | POA: Diagnosis not present

## 2024-01-01 DIAGNOSIS — Z794 Long term (current) use of insulin: Secondary | ICD-10-CM | POA: Diagnosis not present

## 2024-01-01 DIAGNOSIS — Y732 Prosthetic and other implants, materials and accessory gastroenterology and urology devices associated with adverse incidents: Secondary | ICD-10-CM | POA: Diagnosis present

## 2024-01-01 DIAGNOSIS — E1141 Type 2 diabetes mellitus with diabetic mononeuropathy: Secondary | ICD-10-CM | POA: Diagnosis present

## 2024-01-01 DIAGNOSIS — E1122 Type 2 diabetes mellitus with diabetic chronic kidney disease: Secondary | ICD-10-CM | POA: Diagnosis present

## 2024-01-01 DIAGNOSIS — D631 Anemia in chronic kidney disease: Secondary | ICD-10-CM | POA: Diagnosis present

## 2024-01-01 DIAGNOSIS — K2091 Esophagitis, unspecified with bleeding: Secondary | ICD-10-CM | POA: Diagnosis present

## 2024-01-01 DIAGNOSIS — G620 Drug-induced polyneuropathy: Secondary | ICD-10-CM | POA: Diagnosis present

## 2024-01-01 DIAGNOSIS — I132 Hypertensive heart and chronic kidney disease with heart failure and with stage 5 chronic kidney disease, or end stage renal disease: Secondary | ICD-10-CM | POA: Diagnosis not present

## 2024-01-01 DIAGNOSIS — E11319 Type 2 diabetes mellitus with unspecified diabetic retinopathy without macular edema: Secondary | ICD-10-CM | POA: Diagnosis present

## 2024-01-01 DIAGNOSIS — K209 Esophagitis, unspecified without bleeding: Secondary | ICD-10-CM | POA: Diagnosis present

## 2024-01-01 DIAGNOSIS — E785 Hyperlipidemia, unspecified: Secondary | ICD-10-CM | POA: Diagnosis present

## 2024-01-01 DIAGNOSIS — N186 End stage renal disease: Secondary | ICD-10-CM | POA: Diagnosis present

## 2024-01-01 DIAGNOSIS — Z992 Dependence on renal dialysis: Secondary | ICD-10-CM | POA: Diagnosis not present

## 2024-01-01 DIAGNOSIS — K831 Obstruction of bile duct: Secondary | ICD-10-CM | POA: Diagnosis present

## 2024-01-01 DIAGNOSIS — E876 Hypokalemia: Secondary | ICD-10-CM | POA: Diagnosis present

## 2024-01-01 DIAGNOSIS — N2581 Secondary hyperparathyroidism of renal origin: Secondary | ICD-10-CM | POA: Diagnosis present

## 2024-01-01 DIAGNOSIS — K922 Gastrointestinal hemorrhage, unspecified: Secondary | ICD-10-CM | POA: Diagnosis present

## 2024-01-01 DIAGNOSIS — E119 Type 2 diabetes mellitus without complications: Secondary | ICD-10-CM | POA: Diagnosis not present

## 2024-01-01 LAB — CBC WITH DIFFERENTIAL/PLATELET
Abs Immature Granulocytes: 0.11 K/uL — ABNORMAL HIGH (ref 0.00–0.07)
Basophils Absolute: 0 K/uL (ref 0.0–0.1)
Basophils Relative: 0 %
Eosinophils Absolute: 0.1 K/uL (ref 0.0–0.5)
Eosinophils Relative: 2 %
HCT: 20.7 % — ABNORMAL LOW (ref 39.0–52.0)
Hemoglobin: 6.9 g/dL — CL (ref 13.0–17.0)
Immature Granulocytes: 2 %
Lymphocytes Relative: 15 %
Lymphs Abs: 1 K/uL (ref 0.7–4.0)
MCH: 30.5 pg (ref 26.0–34.0)
MCHC: 33.3 g/dL (ref 30.0–36.0)
MCV: 91.6 fL (ref 80.0–100.0)
Monocytes Absolute: 0.7 K/uL (ref 0.1–1.0)
Monocytes Relative: 10 %
Neutro Abs: 4.5 K/uL (ref 1.7–7.7)
Neutrophils Relative %: 71 %
Platelets: 148 K/uL — ABNORMAL LOW (ref 150–400)
RBC: 2.26 MIL/uL — ABNORMAL LOW (ref 4.22–5.81)
RDW: 14 % (ref 11.5–15.5)
WBC: 6.4 K/uL (ref 4.0–10.5)
nRBC: 0 % (ref 0.0–0.2)

## 2024-01-01 LAB — HEMOGLOBIN AND HEMATOCRIT, BLOOD
HCT: 20.8 % — ABNORMAL LOW (ref 39.0–52.0)
Hemoglobin: 6.9 g/dL — CL (ref 13.0–17.0)

## 2024-01-01 LAB — COMPREHENSIVE METABOLIC PANEL WITH GFR
ALT: 25 U/L (ref 0–44)
AST: 23 U/L (ref 15–41)
Albumin: 1.9 g/dL — ABNORMAL LOW (ref 3.5–5.0)
Alkaline Phosphatase: 332 U/L — ABNORMAL HIGH (ref 38–126)
Anion gap: 13 (ref 5–15)
BUN: 15 mg/dL (ref 6–20)
CO2: 28 mmol/L (ref 22–32)
Calcium: 7.9 mg/dL — ABNORMAL LOW (ref 8.9–10.3)
Chloride: 92 mmol/L — ABNORMAL LOW (ref 98–111)
Creatinine, Ser: 3.01 mg/dL — ABNORMAL HIGH (ref 0.61–1.24)
GFR, Estimated: 23 mL/min — ABNORMAL LOW (ref >60)
Glucose, Bld: 102 mg/dL — ABNORMAL HIGH (ref 70–99)
Potassium: 3.3 mmol/L — ABNORMAL LOW (ref 3.5–5.1)
Sodium: 133 mmol/L — ABNORMAL LOW (ref 135–145)
Total Bilirubin: 5.5 mg/dL — ABNORMAL HIGH (ref 0.0–1.2)
Total Protein: 5.3 g/dL — ABNORMAL LOW (ref 6.5–8.1)

## 2024-01-01 LAB — PROTIME-INR
INR: 1 (ref 0.8–1.2)
Prothrombin Time: 13.8 s (ref 11.4–15.2)

## 2024-01-01 LAB — BILIRUBIN, DIRECT: Bilirubin, Direct: 2.6 mg/dL — ABNORMAL HIGH (ref 0.0–0.2)

## 2024-01-01 LAB — GLUCOSE, CAPILLARY
Glucose-Capillary: 105 mg/dL — ABNORMAL HIGH (ref 70–99)
Glucose-Capillary: 112 mg/dL — ABNORMAL HIGH (ref 70–99)

## 2024-01-01 LAB — HEPATITIS B SURFACE ANTIGEN: Hepatitis B Surface Ag: NONREACTIVE

## 2024-01-01 LAB — PHOSPHORUS: Phosphorus: 1.9 mg/dL — ABNORMAL LOW (ref 2.5–4.6)

## 2024-01-01 LAB — APTT: aPTT: 29 s (ref 24–36)

## 2024-01-01 LAB — BRAIN NATRIURETIC PEPTIDE: B Natriuretic Peptide: 328.5 pg/mL — ABNORMAL HIGH (ref 0.0–100.0)

## 2024-01-01 LAB — VITAMIN B12: Vitamin B-12: 1546 pg/mL — ABNORMAL HIGH (ref 180–914)

## 2024-01-01 LAB — MAGNESIUM: Magnesium: 1.7 mg/dL (ref 1.7–2.4)

## 2024-01-01 LAB — PREPARE RBC (CROSSMATCH)

## 2024-01-01 LAB — CBG MONITORING, ED
Glucose-Capillary: 105 mg/dL — ABNORMAL HIGH (ref 70–99)
Glucose-Capillary: 111 mg/dL — ABNORMAL HIGH (ref 70–99)

## 2024-01-01 LAB — GAMMA GT: GGT: 196 U/L — ABNORMAL HIGH (ref 7–50)

## 2024-01-01 MED ORDER — SODIUM CHLORIDE 0.9% IV SOLUTION: Freq: Once | INTRAVENOUS | Status: AC

## 2024-01-01 MED ORDER — CHLORPROMAZINE HCL 25 MG PO TABS
25.0000 mg | ORAL_TABLET | Freq: Once | ORAL | Status: AC
  Administered 2024-01-01 (×2): 25 mg via ORAL
  Filled 2024-01-01: qty 1

## 2024-01-01 MED ORDER — ORAL CARE MOUTH RINSE
15.0000 mL | OROMUCOSAL | Status: DC | PRN
Start: 2024-01-01 — End: 2024-01-03

## 2024-01-01 MED ORDER — HYDRALAZINE HCL 20 MG/ML IJ SOLN
10.0000 mg | Freq: Four times a day (QID) | INTRAMUSCULAR | Status: DC | PRN
  Administered 2024-01-01 (×2): 10 mg via INTRAVENOUS
  Filled 2024-01-01: qty 1

## 2024-01-01 MED ORDER — CHLORHEXIDINE GLUCONATE CLOTH 2 % EX PADS
6.0000 | MEDICATED_PAD | Freq: Every day | CUTANEOUS | Status: DC
  Administered 2024-01-02 – 2024-01-03 (×3): 6 via TOPICAL

## 2024-01-01 MED ORDER — PROMETHAZINE (PHENERGAN) 6.25MG IN NS 50ML IVPB: 6.2500 mg | Freq: Four times a day (QID) | INTRAVENOUS | Status: DC | PRN

## 2024-01-01 NOTE — H&P (View-Only) (Signed)
 Reason for Consult: Anemia and biliary obstruction Referring Physician: [facility]  Helayne Fuse HPI: This is a 58 year old male with a PMH of metastatic colon cancer, CHF, ESRD, and poorly controlled diabetes admitted for symptomatic anemia.  He was noted to have an HGB of 6 g/dL and he was sent from dialysis to the ER.  While in dialysis he did receive one unit of PRBC.  Per the patient he reports some hematemesis.  The hematemesis started last Friday and he had a total of four discrete episodes.  His last episode was yesterday and he felt that he had a significant amount of bleeding.  The blood was in the form of dark red blood.  Further work up in the ER with imaging did show an increasing biliary ductal dilation with two intact plastic biliary stents.  He was supposed to have a repeat ERCP with stent change, but he was suffering with ESRD.  That issue needed to be addressed first.  Over the interval time period he was noted to have an increase in his AP and TB.  Over the course of 3 weeks his TB increased from 0.5 up to 6.0.  He does not report any fever and his WBC is at 6.4 on admission.    Past Medical History:  Diagnosis Date   Arthritis    Hip   CHF (congestive heart failure) (HCC)    Colon cancer (HCC)    Diabetic mononeuropathy associated with type 2 diabetes mellitus (HCC) 03/21/2017   Diabetic retinopathy (HCC) 07/31/2020   Enthesopathy of ankle and tarsus 04/02/2009   Formatting of this note might be different from the original. 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 Type 2 Diabetes Mellitus - Uncomplicated, Uncontrolled    Past Surgical History:  Procedure Laterality Date   AMPUTATION Left 01/22/2018   Procedure: TRANSMETATARSAL AMPUTATION;  Surgeon: Harden Jerona GAILS, MD;  Location: [facility] OR;  Service: Orthopedics;  Laterality: Left;toes   BILIARY STENT PLACEMENT N/A 05/10/2023   Procedure: BILIARY STENT PLACEMENT;  Surgeon: Rollin Dover, MD;  Location: [facility];  Service: Gastroenterology;  Laterality: N/A;   BILIARY STENT PLACEMENT N/A 05/18/2023   Procedure: BILIARY STENT PLACEMENT;  Surgeon: Rollin Dover, MD;  Location: [facility];  Service: Gastroenterology;  Laterality: N/A;   BIOPSY  07/27/2020   Procedure: BIOPSY;  Surgeon: Rollin Dover, MD;  Location: [facility];  Service: Endoscopy;;   COLON RESECTION N/A 07/30/2020   Procedure: HAND ASSISTED LAPAROSCOPIC LEFT HEMI COLECTOMY;  Surgeon: Gladis Cough, MD;  Location: [facility];  Service: General;  Laterality: N/A;   COLONOSCOPY WITH PROPOFOL  N/A 05/24/2018   Procedure: COLONOSCOPY WITH PROPOFOL ;  Surgeon: Rollin Dover, MD;  Location: [facility];  Service: Endoscopy;  Laterality: N/A;   COLONOSCOPY WITH PROPOFOL  N/A 07/27/2020   Procedure: COLONOSCOPY WITH  PROPOFOL ;  Surgeon: Rollin Dover, MD;  Location: [facility];  Service: Endoscopy;  Laterality: N/A;   DIALYSIS/PERMA CATHETER INSERTION N/A 11/19/2023   Procedure: DIALYSIS/PERMA CATHETER INSERTION;  Surgeon: [name] [name], MD;  Location: [facility];  Service: Cardiovascular;  Laterality: N/A;   ERCP N/A 05/10/2023   Procedure: ENDOSCOPIC RETROGRADE CHOLANGIOPANCREATOGRAPHY (ERCP);  Surgeon: Rollin Dover, MD;  Location: [facility];   Service: Gastroenterology;  Laterality: N/A;   ERCP N/A 05/18/2023   Procedure: ENDOSCOPIC RETROGRADE CHOLANGIOPANCREATOGRAPHY (ERCP);  Surgeon: Rollin Dover, MD;  Location: [facility];  Service: Gastroenterology;  Laterality: N/A;   ESOPHAGOGASTRODUODENOSCOPY Left 04/05/2021   Procedure: ESOPHAGOGASTRODUODENOSCOPY (EGD);  Surgeon: Rollin Dover, MD;  Location: [facility];  Service: Endoscopy;  Laterality: Left;   ESOPHAGOGASTRODUODENOSCOPY N/A 05/10/2023   Procedure: ESOPHAGOGASTRODUODENOSCOPY (EGD);  Surgeon: Rollin Dover, MD;  Location: [facility];  Service: Gastroenterology;  Laterality: N/A;   EUS N/A 05/10/2023   Procedure: UPPER ENDOSCOPIC ULTRASOUND (EUS) LINEAR;  Surgeon: Rollin Dover, MD;  Location: [facility];  Service: Gastroenterology;  Laterality: N/A;   FINE NEEDLE ASPIRATION N/A 05/10/2023   Procedure: FINE NEEDLE ASPIRATION (FNA) LINEAR;  Surgeon: Rollin Dover, MD;  Location: [facility];  Service: Gastroenterology;  Laterality: N/A;   INSERTION OF ARTERIOVENOUS (AV) ARTEGRAFT ARM Right 12/06/2023   Procedure: INSERTION, GRAFT, ARTERIOVENOUS, UPPER EXTREMITY;  Surgeon: [name] [name], MD;  Location: [facility];  Service: Vascular;  Laterality: Right;   INSERTION OF DIALYSIS CATHETER Right 12/06/2023   Procedure: EXCHANGE OF DIALYSIS CATHETER USING PALINDROME 23CM CATHETER KIT;  Surgeon: [name] [name], MD;  Location: [facility] OR;  Service: Vascular;  Laterality: Right;   POLYPECTOMY  05/24/2018   Procedure: POLYPECTOMY;  Surgeon: Rollin Dover, MD;  Location: [facility];  Service: Endoscopy;;   PORTACATH PLACEMENT Left 08/24/2020   Procedure: INSERTION PORT-A-CATH;  Surgeon: Gladis Cough, MD;  Location: [facility];  Service: General;  Laterality: Left;  75/rm1   SPHINCTEROTOMY  05/10/2023   Procedure: ANNETT;  Surgeon: Rollin Dover, MD;  Location: [facility];  Service: Gastroenterology;;   ANNETT  05/18/2023   Procedure: ANNETT;  Surgeon: Rollin Dover, MD;  Location: [facility];  Service: Gastroenterology;;   CLEDA REMOVAL  05/18/2023   Procedure: STENT REMOVAL;  Surgeon: Rollin Dover, MD;  Location: [facility];  Service: Gastroenterology;;   SUBMUCOSAL TATTOO INJECTION  07/27/2020   Procedure: SUBMUCOSAL TATTOO INJECTION;  Surgeon: Rollin Dover, MD;  Location: [facility];  Service: Endoscopy;;    Family History  Problem Relation Age of Onset   Hypertension Father     Social History:  reports that he has never smoked. He has never used smokeless tobacco. He reports current alcohol use of about 1.0 standard drink of alcohol per week. He reports that he does not use drugs.  Allergies:  Allergies  Allergen Reactions   Bee Venom Anaphylaxis, Swelling and Other (See Comments)    Cold Sweats, also   Latex Rash and Dermatitis    Medications: Scheduled:  [START ON 01/02/2024] Chlorhexidine  Gluconate Cloth  6 each Topical Q0600   insulin  aspart  0-6 Units Subcutaneous Q6H   pantoprazole  (PROTONIX ) IV  40 mg Intravenous Q12H   Continuous:  promethazine  (PHENERGAN ) injection (IM or IVPB)      Results for orders placed or performed during the hospital encounter of 12/31/23 (from the past 24 hours)  Type and screen [facility]     Status: None (Preliminary result)   Collection Time: 12/31/23  6:06 PM  Result Value Ref  Range   ABO/RH(D) A POS    Antibody Screen NEG    Sample Expiration 01/03/2024,2359    Unit Number [id number]    Blood Component Type RED CELLS,LR    Unit division 00    Status of Unit ISSUED    Transfusion Status OK TO TRANSFUSE    Crossmatch Result Compatible    Unit Number [id number]    Blood Component Type RED CELLS,LR    Unit division 00    Status of Unit ISSUED    Transfusion Status OK TO TRANSFUSE    Crossmatch Result      Compatible Performed at [facility], [address] [place], [place] [address]   CBC with Differential     Status: Abnormal   Collection  Time: 12/31/23  6:06 PM  Result Value Ref Range   WBC 9.1 4.0 - 10.5 K/uL   RBC 2.55 (L) 4.22 - 5.81 MIL/uL   Hemoglobin 7.7 (L) 13.0 - 17.0 g/dL   HCT 76.2 (L) 60.9 - 47.9 %   MCV 92.9 80.0 - 100.0 fL   MCH 30.2 26.0 - 34.0 pg   MCHC 32.5 30.0 - 36.0 g/dL   RDW 85.8 88.4 - 84.4 %   Platelets 184 150 - 400 K/uL   nRBC 0.0 0.0 - 0.2 %   Neutrophils Relative % 76 %   Neutro Abs 6.8 1.7 - 7.7 K/uL   Lymphocytes Relative 14 %   Lymphs Abs 1.3 0.7 - 4.0 K/uL   Monocytes Relative 8 %   Monocytes Absolute 0.8 0.1 - 1.0 K/uL   Eosinophils Relative 1 %   Eosinophils Absolute 0.1 0.0 - 0.5 K/uL   Basophils Relative 0 %   Basophils Absolute 0.0 0.0 - 0.1 K/uL   Immature Granulocytes 1 %   Abs Immature Granulocytes 0.08 (H) 0.00 - 0.07 K/uL  Comprehensive metabolic panel     Status: Abnormal   Collection Time: 12/31/23  6:06 PM  Result Value Ref Range   Sodium 133 (L) 135 - 145 mmol/L   Potassium 3.5 3.5 - 5.1 mmol/L   Chloride 91 (L) 98 - 111 mmol/L   CO2 28 22 - 32 mmol/L   Glucose, Bld 104 (H) 70 - 99 mg/dL   BUN 10 6 - 20 mg/dL   Creatinine, Ser 7.90 (H) 0.61 - 1.24 mg/dL   Calcium  8.1 (L) 8.9 - 10.3 mg/dL   Total Protein 6.3 (L) 6.5 - 8.1 g/dL   Albumin  2.1 (L) 3.5 - 5.0 g/dL   AST 23 15 - 41 U/L   ALT 27 0 - 44 U/L   Alkaline Phosphatase 399 (H) 38 - 126 U/L   Total Bilirubin 6.0 (H) 0.0 - 1.2 mg/dL   GFR, Estimated 36 (L) >60 mL/min   Anion gap 14 5 - 15  Protime-INR     Status: None   Collection Time: 12/31/23  6:06 PM  Result Value Ref Range   Prothrombin Time 13.6 11.4 - 15.2 seconds   INR 1.0 0.8 - 1.2  APTT     Status: None   Collection Time: 12/31/23  6:06 PM  Result Value Ref Range   aPTT 30 24 - 36 seconds  Magnesium      Status: None   Collection Time: 12/31/23  6:06 PM  Result Value Ref Range   Magnesium  1.7 1.7 - 2.4 mg/dL  Ferritin     Status: Abnormal   Collection Time: 12/31/23  6:06 PM  Result Value Ref Range  Ferritin 1,554 (H) 24 - 336 ng/mL   Iron and TIBC     Status: Abnormal   Collection Time: 12/31/23  6:06 PM  Result Value Ref Range   Iron 108 45 - 182 ug/dL   TIBC 827 (L) 749 - 549 ug/dL   Saturation Ratios 63 (H) 17.9 - 39.5 %   UIBC 64 ug/dL  Folate     Status: Abnormal   Collection Time: 12/31/23  6:06 PM  Result Value Ref Range   Folate 3.8 (L) >5.9 ng/mL  Lactate dehydrogenase     Status: Abnormal   Collection Time: 12/31/23  6:06 PM  Result Value Ref Range   LDH 194 (H) 98 - 192 U/L  Fibrinogen      Status: Abnormal   Collection Time: 12/31/23  6:06 PM  Result Value Ref Range   Fibrinogen  >800 (H) 210 - 475 mg/dL  Technologist smear review     Status: None   Collection Time: 12/31/23  6:06 PM  Result Value Ref Range   WBC MORPHOLOGY MORPHOLOGY UNREMARKABLE    RBC MORPHOLOGY POLYCHROMASIA PRESENT    Plt Morphology MORPHOLOGY UNREMARKABLE    Clinical Information      acute on chronic anemia, likely with an element of acute blood loss, but also with new finding of hyperbilirubinemia  I-stat chem 8, ED (not at [facility], [facility] or [facility])     Status: Abnormal   Collection Time: 12/31/23  6:34 PM  Result Value Ref Range   Sodium 134 (L) 135 - 145 mmol/L   Potassium 3.1 (L) 3.5 - 5.1 mmol/L   Chloride 90 (L) 98 - 111 mmol/L   BUN 10 6 - 20 mg/dL   Creatinine, Ser 8.09 (H) 0.61 - 1.24 mg/dL   Glucose, Bld 99 70 - 99 mg/dL   Calcium , Ion 1.00 (L) 1.15 - 1.40 mmol/L   TCO2 30 22 - 32 mmol/L   Hemoglobin 8.5 (L) 13.0 - 17.0 g/dL   HCT 74.9 (L) 60.9 - 47.9 %  POC occult blood, ED     Status: None   Collection Time: 12/31/23  7:16 PM  Result Value Ref Range   Fecal Occult Bld NEGATIVE NEGATIVE  CBG monitoring, ED     Status: None   Collection Time: 12/31/23 11:32 PM  Result Value Ref Range   Glucose-Capillary 93 70 - 99 mg/dL  CBC with Differential/Platelet     Status: Abnormal   Collection Time: 01/01/24  2:51 AM  Result Value Ref Range   WBC 6.4 4.0 - 10.5 K/uL   RBC 2.26 (L) 4.22 - 5.81 MIL/uL   Hemoglobin 6.9  (LL) 13.0 - 17.0 g/dL   HCT 79.2 (L) 60.9 - 47.9 %   MCV 91.6 80.0 - 100.0 fL   MCH 30.5 26.0 - 34.0 pg   MCHC 33.3 30.0 - 36.0 g/dL   RDW 85.9 88.4 - 84.4 %   Platelets 148 (L) 150 - 400 K/uL   nRBC 0.0 0.0 - 0.2 %   Neutrophils Relative % 71 %   Neutro Abs 4.5 1.7 - 7.7 K/uL   Lymphocytes Relative 15 %   Lymphs Abs 1.0 0.7 - 4.0 K/uL   Monocytes Relative 10 %   Monocytes Absolute 0.7 0.1 - 1.0 K/uL   Eosinophils Relative 2 %   Eosinophils Absolute 0.1 0.0 - 0.5 K/uL   Basophils Relative 0 %   Basophils Absolute 0.0 0.0 - 0.1 K/uL   Immature Granulocytes 2 %   Abs Immature Granulocytes  0.11 (H) 0.00 - 0.07 K/uL  Comprehensive metabolic panel with GFR     Status: Abnormal   Collection Time: 01/01/24  2:51 AM  Result Value Ref Range   Sodium 133 (L) 135 - 145 mmol/L   Potassium 3.3 (L) 3.5 - 5.1 mmol/L   Chloride 92 (L) 98 - 111 mmol/L   CO2 28 22 - 32 mmol/L   Glucose, Bld 102 (H) 70 - 99 mg/dL   BUN 15 6 - 20 mg/dL   Creatinine, Ser 6.98 (H) 0.61 - 1.24 mg/dL   Calcium  7.9 (L) 8.9 - 10.3 mg/dL   Total Protein 5.3 (L) 6.5 - 8.1 g/dL   Albumin  1.9 (L) 3.5 - 5.0 g/dL   AST 23 15 - 41 U/L   ALT 25 0 - 44 U/L   Alkaline Phosphatase 332 (H) 38 - 126 U/L   Total Bilirubin 5.5 (H) 0.0 - 1.2 mg/dL   GFR, Estimated 23 (L) >60 mL/min   Anion gap 13 5 - 15  Magnesium      Status: None   Collection Time: 01/01/24  2:51 AM  Result Value Ref Range   Magnesium  1.7 1.7 - 2.4 mg/dL  APTT     Status: None   Collection Time: 01/01/24  2:51 AM  Result Value Ref Range   aPTT 29 24 - 36 seconds  Protime-INR     Status: None   Collection Time: 01/01/24  2:51 AM  Result Value Ref Range   Prothrombin Time 13.8 11.4 - 15.2 seconds   INR 1.0 0.8 - 1.2  Vitamin B12     Status: Abnormal   Collection Time: 01/01/24  2:51 AM  Result Value Ref Range   Vitamin B-12 1,546 (H) 180 - 914 pg/mL  Gamma GT     Status: Abnormal   Collection Time: 01/01/24  2:51 AM  Result Value Ref Range   GGT 196  (H) 7 - 50 U/L  Bilirubin, direct     Status: Abnormal   Collection Time: 01/01/24  2:51 AM  Result Value Ref Range   Bilirubin, Direct 2.6 (H) 0.0 - 0.2 mg/dL  Phosphorus     Status: Abnormal   Collection Time: 01/01/24  2:51 AM  Result Value Ref Range   Phosphorus 1.9 (L) 2.5 - 4.6 mg/dL  Brain natriuretic peptide     Status: Abnormal   Collection Time: 01/01/24  2:51 AM  Result Value Ref Range   B Natriuretic Peptide 328.5 (H) 0.0 - 100.0 pg/mL  Prepare RBC (crossmatch)     Status: None   Collection Time: 01/01/24  4:30 AM  Result Value Ref Range   Order Confirmation      ORDER PROCESSED BY BLOOD BANK Performed at [facility], [address], [place], [place] [address]   CBG monitoring, ED     Status: Abnormal   Collection Time: 01/01/24  5:28 AM  Result Value Ref Range   Glucose-Capillary 105 (H) 70 - 99 mg/dL  CBG monitoring, ED     Status: Abnormal   Collection Time: 01/01/24  7:46 AM  Result Value Ref Range   Glucose-Capillary 111 (H) 70 - 99 mg/dL  Hemoglobin and hematocrit, blood     Status: Abnormal   Collection Time: 01/01/24  8:46 AM  Result Value Ref Range   Hemoglobin 6.9 (LL) 13.0 - 17.0 g/dL   HCT 79.1 (L) 60.9 - 47.9 %  Prepare RBC (crossmatch)     Status: None   Collection Time:  01/01/24 10:30 AM  Result Value Ref Range   Order Confirmation      ORDER PROCESSED BY BLOOD BANK Performed at [facility], [address], [place], [place] [address]   Glucose, capillary     Status: Abnormal   Collection Time: 01/01/24 11:55 AM  Result Value Ref Range   Glucose-Capillary 105 (H) 70 - 99 mg/dL     CT ABDOMEN PELVIS WO CONTRAST Result Date: 01/01/2024 CLINICAL DATA:  Abdominal pain. Hyperbilirubinemia. History of obstructive jaundice in December 2024. EXAM: CT ABDOMEN AND PELVIS WITHOUT CONTRAST TECHNIQUE: Multidetector CT imaging of the abdomen and pelvis was performed following the standard protocol without IV contrast. RADIATION DOSE REDUCTION:  This exam was performed according to the departmental dose-optimization program which includes automated exposure control, adjustment of the mA and/or kV according to patient size and/or use of iterative reconstruction technique. COMPARISON:  10/13/2023 and older exams. MR with MRCP dated 05/15/2023. FINDINGS: Lower chest: Small, right greater than left, pleural effusions. There is associated dependent lower lobe opacity, right greater than left, consistent with atelectasis. Hepatobiliary: Liver normal in size and overall attenuation vague 1.4 cm mass bulges the anterior contour of segment 3, not appreciated on prior imaging. No other evidence of a liver mass or focal lesion. There is intra and extrahepatic bile duct dilation despite the presence of biliary stents. This is increased from the prior CT, common hepatic duct measuring 7 8 mm, previously 6 mm. Gallbladder is distended.  No gallstones or wall thickening. Pancreas: Oval mass arises from the anterior superior margin pancreatic body, slightly hyperattenuating to the remaining pancreas, measuring 2.3 x 2.1 x 2.5 cm. This was not evident on prior studies. No other evidence of a pancreatic mass. No inflammation or duct dilation. Spleen: Normal in size without focal abnormality. Adrenals/Urinary Tract: No adrenal mass. Kidneys normal in overall size and position. Bilateral perinephric stranding is similar to the prior CT. Oval hypoattenuating homogeneous mass, mid to lower pole the right kidney, 3.6 cm, stable consistent with a cyst. No follow-up indicated no other defined renal masses, no stones and no hydronephrosis. Ureters are normal in course and in caliber. Bladder is unremarkable. Stomach/Bowel: Stomach unremarkable. Small bowel and colon are normal caliber. No wall thickening. No inflammation. Stable changes from a left transverse colon anastomosis. Vascular/Lymphatic: There are enlarged shoddy gastrohepatic ligament lymph nodes and porta hepatis nodes,  measuring up to 1.4 cm in short axis, somewhat better appreciated the current exam, but otherwise not convincingly changed. There are also prominent, but subcentimeter periceliac nodes. No other evidence of lymphadenopathy. No significant vascular abnormality on this unenhanced study. Reproductive: Mild prostate enlargement measuring 5.3 x 3.9 x 4.0 cm, unchanged. Other: No ascites. Musculoskeletal: No fracture or acute finding. No osteoblastic or osteolytic lesions. IMPRESSION: 1. Interval increase intrahepatic bile duct dilation compared to the CT from 10/13/2023. 2. Apparent mass arising from the anterior superior margin of the pancreatic body, 2.5 cm. This could reflect either a primary pancreatic mass or adjacent enlarged lymph node. 3. Subtle hypoattenuating, 1.4 cm masslike lesion mildly bulges the anterior contour of the left liver lobe, segment 3, not evident prior exams. 4. Persistent shoddy lymphadenopathy centered along gastrohepatic ligament. 5. Combination of these consistent with worsening metastatic disease. 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 Findings are most suspicious for either a pancreatic primary carcinoma or cholangiocarcinoma. 6. Small, right greater than left, pleural effusions with associated dependent atelectasis, new since prior CT. Electronically  Signed   By: Alm Parkins M.D.   On: 01/01/2024 08:25    ROS:  As stated above in the HPI otherwise negative.  Blood pressure (!) 178/77, pulse 72, temperature 98 F (36.7 C), temperature source Oral, resp. rate 13, height 6' 1 (1.854 m), weight 75 kg, SpO2 100%.    PE: Gen: NAD, Alert and Oriented, weak HEENT:  [place]/AT, EOMI, scleral icterus Lungs: CTA Bilaterally CV: RRR without M/G/R ABD: Soft, NTND, +BS Ext: No C/C/E  Assessment/Plan: 1) Hematemesis. 2) Worsened anemia. 3) Biliary ductal dilation. 4) Metastatic colon cancer.   The patient does not report  any overt issues with GERD, but an esophagitis can be the source of his recent hematemesis.  Older patients may not necessarily feel the symptoms of GERD/esophagitis.  Also, being on HD with anticoagulation, will precipitate bleeding.  The ERCP tomorrow will allow for visualization of the upper GI tract as well as addressing the biliary obstruction.  It appears that the stents are now clogged.  With his significant metastatic state, it is unlikely that his lymph nodes will completely regress.  At this point, he will benefit with an uncovered metallic stent.  Plan: 1) ERCP with metallic stent placement. 2) HD after the ERCP.    Jeffrey Young D 01/01/2024, 3:57 PM

## 2024-01-01 NOTE — Progress Notes (Signed)
 Hospitalist Progress Note:  Updated Hgb is 6.9.  I have ordered transfusion of 1 unit PRBC to occur over 4 hours, as well as order for repeat H&H to be checked after completion of transfusion of this 1 unit prbc.     Eva Pore, DO Hospitalist

## 2024-01-01 NOTE — Consult Note (Signed)
 Reason for Consult: Anemia and biliary obstruction Referring Physician: [facility]  Helayne Fuse HPI: This is a 58 year old male with a PMH of metastatic colon cancer, CHF, ESRD, and poorly controlled diabetes admitted for symptomatic anemia.  He was noted to have an HGB of 6 g/dL and he was sent from dialysis to the ER.  While in dialysis he did receive one unit of PRBC.  Per the patient he reports some hematemesis.  The hematemesis started last Friday and he had a total of four discrete episodes.  His last episode was yesterday and he felt that he had a significant amount of bleeding.  The blood was in the form of dark red blood.  Further work up in the ER with imaging did show an increasing biliary ductal dilation with two intact plastic biliary stents.  He was supposed to have a repeat ERCP with stent change, but he was suffering with ESRD.  That issue needed to be addressed first.  Over the interval time period he was noted to have an increase in his AP and TB.  Over the course of 3 weeks his TB increased from 0.5 up to 6.0.  He does not report any fever and his WBC is at 6.4 on admission.    Past Medical History:  Diagnosis Date   Arthritis    Hip   CHF (congestive heart failure) (HCC)    Colon cancer (HCC)    Diabetic mononeuropathy associated with type 2 diabetes mellitus (HCC) 03/21/2017   Diabetic retinopathy (HCC) 07/31/2020   Enthesopathy of ankle and tarsus 04/02/2009   Formatting of this note might be different from the original. 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 Type 2 Diabetes Mellitus - Uncomplicated, Uncontrolled    Past Surgical History:  Procedure Laterality Date   AMPUTATION Left 01/22/2018   Procedure: TRANSMETATARSAL AMPUTATION;  Surgeon: Harden Jerona GAILS, MD;  Location: [facility] OR;  Service: Orthopedics;  Laterality: Left;toes   BILIARY STENT PLACEMENT N/A 05/10/2023   Procedure: BILIARY STENT PLACEMENT;  Surgeon: Rollin Dover, MD;  Location: [facility];  Service: Gastroenterology;  Laterality: N/A;   BILIARY STENT PLACEMENT N/A 05/18/2023   Procedure: BILIARY STENT PLACEMENT;  Surgeon: Rollin Dover, MD;  Location: [facility];  Service: Gastroenterology;  Laterality: N/A;   BIOPSY  07/27/2020   Procedure: BIOPSY;  Surgeon: Rollin Dover, MD;  Location: [facility];  Service: Endoscopy;;   COLON RESECTION N/A 07/30/2020   Procedure: HAND ASSISTED LAPAROSCOPIC LEFT HEMI COLECTOMY;  Surgeon: Gladis Cough, MD;  Location: [facility];  Service: General;  Laterality: N/A;   COLONOSCOPY WITH PROPOFOL  N/A 05/24/2018   Procedure: COLONOSCOPY WITH PROPOFOL ;  Surgeon: Rollin Dover, MD;  Location: [facility];  Service: Endoscopy;  Laterality: N/A;   COLONOSCOPY WITH PROPOFOL  N/A 07/27/2020   Procedure: COLONOSCOPY WITH  PROPOFOL ;  Surgeon: Rollin Dover, MD;  Location: [facility];  Service: Endoscopy;  Laterality: N/A;   DIALYSIS/PERMA CATHETER INSERTION N/A 11/19/2023   Procedure: DIALYSIS/PERMA CATHETER INSERTION;  Surgeon: [name] [name], MD;  Location: [facility];  Service: Cardiovascular;  Laterality: N/A;   ERCP N/A 05/10/2023   Procedure: ENDOSCOPIC RETROGRADE CHOLANGIOPANCREATOGRAPHY (ERCP);  Surgeon: Rollin Dover, MD;  Location: [facility];   Service: Gastroenterology;  Laterality: N/A;   ERCP N/A 05/18/2023   Procedure: ENDOSCOPIC RETROGRADE CHOLANGIOPANCREATOGRAPHY (ERCP);  Surgeon: Rollin Dover, MD;  Location: [facility];  Service: Gastroenterology;  Laterality: N/A;   ESOPHAGOGASTRODUODENOSCOPY Left 04/05/2021   Procedure: ESOPHAGOGASTRODUODENOSCOPY (EGD);  Surgeon: Rollin Dover, MD;  Location: [facility];  Service: Endoscopy;  Laterality: Left;   ESOPHAGOGASTRODUODENOSCOPY N/A 05/10/2023   Procedure: ESOPHAGOGASTRODUODENOSCOPY (EGD);  Surgeon: Rollin Dover, MD;  Location: [facility];  Service: Gastroenterology;  Laterality: N/A;   EUS N/A 05/10/2023   Procedure: UPPER ENDOSCOPIC ULTRASOUND (EUS) LINEAR;  Surgeon: Rollin Dover, MD;  Location: [facility];  Service: Gastroenterology;  Laterality: N/A;   FINE NEEDLE ASPIRATION N/A 05/10/2023   Procedure: FINE NEEDLE ASPIRATION (FNA) LINEAR;  Surgeon: Rollin Dover, MD;  Location: [facility];  Service: Gastroenterology;  Laterality: N/A;   INSERTION OF ARTERIOVENOUS (AV) ARTEGRAFT ARM Right 12/06/2023   Procedure: INSERTION, GRAFT, ARTERIOVENOUS, UPPER EXTREMITY;  Surgeon: [name] [name], MD;  Location: [facility];  Service: Vascular;  Laterality: Right;   INSERTION OF DIALYSIS CATHETER Right 12/06/2023   Procedure: EXCHANGE OF DIALYSIS CATHETER USING PALINDROME 23CM CATHETER KIT;  Surgeon: [name] [name], MD;  Location: [facility] OR;  Service: Vascular;  Laterality: Right;   POLYPECTOMY  05/24/2018   Procedure: POLYPECTOMY;  Surgeon: Rollin Dover, MD;  Location: [facility];  Service: Endoscopy;;   PORTACATH PLACEMENT Left 08/24/2020   Procedure: INSERTION PORT-A-CATH;  Surgeon: Gladis Cough, MD;  Location: [facility];  Service: General;  Laterality: Left;  75/rm1   SPHINCTEROTOMY  05/10/2023   Procedure: ANNETT;  Surgeon: Rollin Dover, MD;  Location: [facility];  Service: Gastroenterology;;   ANNETT  05/18/2023   Procedure: ANNETT;  Surgeon: Rollin Dover, MD;  Location: [facility];  Service: Gastroenterology;;   CLEDA REMOVAL  05/18/2023   Procedure: STENT REMOVAL;  Surgeon: Rollin Dover, MD;  Location: [facility];  Service: Gastroenterology;;   SUBMUCOSAL TATTOO INJECTION  07/27/2020   Procedure: SUBMUCOSAL TATTOO INJECTION;  Surgeon: Rollin Dover, MD;  Location: [facility];  Service: Endoscopy;;    Family History  Problem Relation Age of Onset   Hypertension Father     Social History:  reports that he has never smoked. He has never used smokeless tobacco. He reports current alcohol use of about 1.0 standard drink of alcohol per week. He reports that he does not use drugs.  Allergies:  Allergies  Allergen Reactions   Bee Venom Anaphylaxis, Swelling and Other (See Comments)    Cold Sweats, also   Latex Rash and Dermatitis    Medications: Scheduled:  [START ON 01/02/2024] Chlorhexidine  Gluconate Cloth  6 each Topical Q0600   insulin  aspart  0-6 Units Subcutaneous Q6H   pantoprazole  (PROTONIX ) IV  40 mg Intravenous Q12H   Continuous:  promethazine  (PHENERGAN ) injection (IM or IVPB)      Results for orders placed or performed during the hospital encounter of 12/31/23 (from the past 24 hours)  Type and screen [facility]     Status: None (Preliminary result)   Collection Time: 12/31/23  6:06 PM  Result Value Ref  Range   ABO/RH(D) A POS    Antibody Screen NEG    Sample Expiration 01/03/2024,2359    Unit Number [id number]    Blood Component Type RED CELLS,LR    Unit division 00    Status of Unit ISSUED    Transfusion Status OK TO TRANSFUSE    Crossmatch Result Compatible    Unit Number [id number]    Blood Component Type RED CELLS,LR    Unit division 00    Status of Unit ISSUED    Transfusion Status OK TO TRANSFUSE    Crossmatch Result      Compatible Performed at [facility], [address] [place], [place] [address]   CBC with Differential     Status: Abnormal   Collection  Time: 12/31/23  6:06 PM  Result Value Ref Range   WBC 9.1 4.0 - 10.5 K/uL   RBC 2.55 (L) 4.22 - 5.81 MIL/uL   Hemoglobin 7.7 (L) 13.0 - 17.0 g/dL   HCT 76.2 (L) 60.9 - 47.9 %   MCV 92.9 80.0 - 100.0 fL   MCH 30.2 26.0 - 34.0 pg   MCHC 32.5 30.0 - 36.0 g/dL   RDW 85.8 88.4 - 84.4 %   Platelets 184 150 - 400 K/uL   nRBC 0.0 0.0 - 0.2 %   Neutrophils Relative % 76 %   Neutro Abs 6.8 1.7 - 7.7 K/uL   Lymphocytes Relative 14 %   Lymphs Abs 1.3 0.7 - 4.0 K/uL   Monocytes Relative 8 %   Monocytes Absolute 0.8 0.1 - 1.0 K/uL   Eosinophils Relative 1 %   Eosinophils Absolute 0.1 0.0 - 0.5 K/uL   Basophils Relative 0 %   Basophils Absolute 0.0 0.0 - 0.1 K/uL   Immature Granulocytes 1 %   Abs Immature Granulocytes 0.08 (H) 0.00 - 0.07 K/uL  Comprehensive metabolic panel     Status: Abnormal   Collection Time: 12/31/23  6:06 PM  Result Value Ref Range   Sodium 133 (L) 135 - 145 mmol/L   Potassium 3.5 3.5 - 5.1 mmol/L   Chloride 91 (L) 98 - 111 mmol/L   CO2 28 22 - 32 mmol/L   Glucose, Bld 104 (H) 70 - 99 mg/dL   BUN 10 6 - 20 mg/dL   Creatinine, Ser 7.90 (H) 0.61 - 1.24 mg/dL   Calcium  8.1 (L) 8.9 - 10.3 mg/dL   Total Protein 6.3 (L) 6.5 - 8.1 g/dL   Albumin  2.1 (L) 3.5 - 5.0 g/dL   AST 23 15 - 41 U/L   ALT 27 0 - 44 U/L   Alkaline Phosphatase 399 (H) 38 - 126 U/L   Total Bilirubin 6.0 (H) 0.0 - 1.2 mg/dL   GFR, Estimated 36 (L) >60 mL/min   Anion gap 14 5 - 15  Protime-INR     Status: None   Collection Time: 12/31/23  6:06 PM  Result Value Ref Range   Prothrombin Time 13.6 11.4 - 15.2 seconds   INR 1.0 0.8 - 1.2  APTT     Status: None   Collection Time: 12/31/23  6:06 PM  Result Value Ref Range   aPTT 30 24 - 36 seconds  Magnesium      Status: None   Collection Time: 12/31/23  6:06 PM  Result Value Ref Range   Magnesium  1.7 1.7 - 2.4 mg/dL  Ferritin     Status: Abnormal   Collection Time: 12/31/23  6:06 PM  Result Value Ref Range  Ferritin 1,554 (H) 24 - 336 ng/mL   Iron and TIBC     Status: Abnormal   Collection Time: 12/31/23  6:06 PM  Result Value Ref Range   Iron 108 45 - 182 ug/dL   TIBC 827 (L) 749 - 549 ug/dL   Saturation Ratios 63 (H) 17.9 - 39.5 %   UIBC 64 ug/dL  Folate     Status: Abnormal   Collection Time: 12/31/23  6:06 PM  Result Value Ref Range   Folate 3.8 (L) >5.9 ng/mL  Lactate dehydrogenase     Status: Abnormal   Collection Time: 12/31/23  6:06 PM  Result Value Ref Range   LDH 194 (H) 98 - 192 U/L  Fibrinogen      Status: Abnormal   Collection Time: 12/31/23  6:06 PM  Result Value Ref Range   Fibrinogen  >800 (H) 210 - 475 mg/dL  Technologist smear review     Status: None   Collection Time: 12/31/23  6:06 PM  Result Value Ref Range   WBC MORPHOLOGY MORPHOLOGY UNREMARKABLE    RBC MORPHOLOGY POLYCHROMASIA PRESENT    Plt Morphology MORPHOLOGY UNREMARKABLE    Clinical Information      acute on chronic anemia, likely with an element of acute blood loss, but also with new finding of hyperbilirubinemia  I-stat chem 8, ED (not at [facility], [facility] or [facility])     Status: Abnormal   Collection Time: 12/31/23  6:34 PM  Result Value Ref Range   Sodium 134 (L) 135 - 145 mmol/L   Potassium 3.1 (L) 3.5 - 5.1 mmol/L   Chloride 90 (L) 98 - 111 mmol/L   BUN 10 6 - 20 mg/dL   Creatinine, Ser 8.09 (H) 0.61 - 1.24 mg/dL   Glucose, Bld 99 70 - 99 mg/dL   Calcium , Ion 1.00 (L) 1.15 - 1.40 mmol/L   TCO2 30 22 - 32 mmol/L   Hemoglobin 8.5 (L) 13.0 - 17.0 g/dL   HCT 74.9 (L) 60.9 - 47.9 %  POC occult blood, ED     Status: None   Collection Time: 12/31/23  7:16 PM  Result Value Ref Range   Fecal Occult Bld NEGATIVE NEGATIVE  CBG monitoring, ED     Status: None   Collection Time: 12/31/23 11:32 PM  Result Value Ref Range   Glucose-Capillary 93 70 - 99 mg/dL  CBC with Differential/Platelet     Status: Abnormal   Collection Time: 01/01/24  2:51 AM  Result Value Ref Range   WBC 6.4 4.0 - 10.5 K/uL   RBC 2.26 (L) 4.22 - 5.81 MIL/uL   Hemoglobin 6.9  (LL) 13.0 - 17.0 g/dL   HCT 79.2 (L) 60.9 - 47.9 %   MCV 91.6 80.0 - 100.0 fL   MCH 30.5 26.0 - 34.0 pg   MCHC 33.3 30.0 - 36.0 g/dL   RDW 85.9 88.4 - 84.4 %   Platelets 148 (L) 150 - 400 K/uL   nRBC 0.0 0.0 - 0.2 %   Neutrophils Relative % 71 %   Neutro Abs 4.5 1.7 - 7.7 K/uL   Lymphocytes Relative 15 %   Lymphs Abs 1.0 0.7 - 4.0 K/uL   Monocytes Relative 10 %   Monocytes Absolute 0.7 0.1 - 1.0 K/uL   Eosinophils Relative 2 %   Eosinophils Absolute 0.1 0.0 - 0.5 K/uL   Basophils Relative 0 %   Basophils Absolute 0.0 0.0 - 0.1 K/uL   Immature Granulocytes 2 %   Abs Immature Granulocytes  0.11 (H) 0.00 - 0.07 K/uL  Comprehensive metabolic panel with GFR     Status: Abnormal   Collection Time: 01/01/24  2:51 AM  Result Value Ref Range   Sodium 133 (L) 135 - 145 mmol/L   Potassium 3.3 (L) 3.5 - 5.1 mmol/L   Chloride 92 (L) 98 - 111 mmol/L   CO2 28 22 - 32 mmol/L   Glucose, Bld 102 (H) 70 - 99 mg/dL   BUN 15 6 - 20 mg/dL   Creatinine, Ser 6.98 (H) 0.61 - 1.24 mg/dL   Calcium  7.9 (L) 8.9 - 10.3 mg/dL   Total Protein 5.3 (L) 6.5 - 8.1 g/dL   Albumin  1.9 (L) 3.5 - 5.0 g/dL   AST 23 15 - 41 U/L   ALT 25 0 - 44 U/L   Alkaline Phosphatase 332 (H) 38 - 126 U/L   Total Bilirubin 5.5 (H) 0.0 - 1.2 mg/dL   GFR, Estimated 23 (L) >60 mL/min   Anion gap 13 5 - 15  Magnesium      Status: None   Collection Time: 01/01/24  2:51 AM  Result Value Ref Range   Magnesium  1.7 1.7 - 2.4 mg/dL  APTT     Status: None   Collection Time: 01/01/24  2:51 AM  Result Value Ref Range   aPTT 29 24 - 36 seconds  Protime-INR     Status: None   Collection Time: 01/01/24  2:51 AM  Result Value Ref Range   Prothrombin Time 13.8 11.4 - 15.2 seconds   INR 1.0 0.8 - 1.2  Vitamin B12     Status: Abnormal   Collection Time: 01/01/24  2:51 AM  Result Value Ref Range   Vitamin B-12 1,546 (H) 180 - 914 pg/mL  Gamma GT     Status: Abnormal   Collection Time: 01/01/24  2:51 AM  Result Value Ref Range   GGT 196  (H) 7 - 50 U/L  Bilirubin, direct     Status: Abnormal   Collection Time: 01/01/24  2:51 AM  Result Value Ref Range   Bilirubin, Direct 2.6 (H) 0.0 - 0.2 mg/dL  Phosphorus     Status: Abnormal   Collection Time: 01/01/24  2:51 AM  Result Value Ref Range   Phosphorus 1.9 (L) 2.5 - 4.6 mg/dL  Brain natriuretic peptide     Status: Abnormal   Collection Time: 01/01/24  2:51 AM  Result Value Ref Range   B Natriuretic Peptide 328.5 (H) 0.0 - 100.0 pg/mL  Prepare RBC (crossmatch)     Status: None   Collection Time: 01/01/24  4:30 AM  Result Value Ref Range   Order Confirmation      ORDER PROCESSED BY BLOOD BANK Performed at [facility], [address], [place], [place] [address]   CBG monitoring, ED     Status: Abnormal   Collection Time: 01/01/24  5:28 AM  Result Value Ref Range   Glucose-Capillary 105 (H) 70 - 99 mg/dL  CBG monitoring, ED     Status: Abnormal   Collection Time: 01/01/24  7:46 AM  Result Value Ref Range   Glucose-Capillary 111 (H) 70 - 99 mg/dL  Hemoglobin and hematocrit, blood     Status: Abnormal   Collection Time: 01/01/24  8:46 AM  Result Value Ref Range   Hemoglobin 6.9 (LL) 13.0 - 17.0 g/dL   HCT 79.1 (L) 60.9 - 47.9 %  Prepare RBC (crossmatch)     Status: None   Collection Time:  01/01/24 10:30 AM  Result Value Ref Range   Order Confirmation      ORDER PROCESSED BY BLOOD BANK Performed at [facility], [address] [place], [place] [address]   Glucose, capillary     Status: Abnormal   Collection Time: 01/01/24 11:55 AM  Result Value Ref Range   Glucose-Capillary 105 (H) 70 - 99 mg/dL     CT ABDOMEN PELVIS WO CONTRAST Result Date: 01/01/2024 CLINICAL DATA:  Abdominal pain. Hyperbilirubinemia. History of obstructive jaundice in December 2024. EXAM: CT ABDOMEN AND PELVIS WITHOUT CONTRAST TECHNIQUE: Multidetector CT imaging of the abdomen and pelvis was performed following the standard protocol without IV contrast. RADIATION DOSE REDUCTION:  This exam was performed according to the departmental dose-optimization program which includes automated exposure control, adjustment of the mA and/or kV according to patient size and/or use of iterative reconstruction technique. COMPARISON:  10/13/2023 and older exams. MR with MRCP dated 05/15/2023. FINDINGS: Lower chest: Small, right greater than left, pleural effusions. There is associated dependent lower lobe opacity, right greater than left, consistent with atelectasis. Hepatobiliary: Liver normal in size and overall attenuation vague 1.4 cm mass bulges the anterior contour of segment 3, not appreciated on prior imaging. No other evidence of a liver mass or focal lesion. There is intra and extrahepatic bile duct dilation despite the presence of biliary stents. This is increased from the prior CT, common hepatic duct measuring 7 8 mm, previously 6 mm. Gallbladder is distended.  No gallstones or wall thickening. Pancreas: Oval mass arises from the anterior superior margin pancreatic body, slightly hyperattenuating to the remaining pancreas, measuring 2.3 x 2.1 x 2.5 cm. This was not evident on prior studies. No other evidence of a pancreatic mass. No inflammation or duct dilation. Spleen: Normal in size without focal abnormality. Adrenals/Urinary Tract: No adrenal mass. Kidneys normal in overall size and position. Bilateral perinephric stranding is similar to the prior CT. Oval hypoattenuating homogeneous mass, mid to lower pole the right kidney, 3.6 cm, stable consistent with a cyst. No follow-up indicated no other defined renal masses, no stones and no hydronephrosis. Ureters are normal in course and in caliber. Bladder is unremarkable. Stomach/Bowel: Stomach unremarkable. Small bowel and colon are normal caliber. No wall thickening. No inflammation. Stable changes from a left transverse colon anastomosis. Vascular/Lymphatic: There are enlarged shoddy gastrohepatic ligament lymph nodes and porta hepatis nodes,  measuring up to 1.4 cm in short axis, somewhat better appreciated the current exam, but otherwise not convincingly changed. There are also prominent, but subcentimeter periceliac nodes. No other evidence of lymphadenopathy. No significant vascular abnormality on this unenhanced study. Reproductive: Mild prostate enlargement measuring 5.3 x 3.9 x 4.0 cm, unchanged. Other: No ascites. Musculoskeletal: No fracture or acute finding. No osteoblastic or osteolytic lesions. IMPRESSION: 1. Interval increase intrahepatic bile duct dilation compared to the CT from 10/13/2023. 2. Apparent mass arising from the anterior superior margin of the pancreatic body, 2.5 cm. This could reflect either a primary pancreatic mass or adjacent enlarged lymph node. 3. Subtle hypoattenuating, 1.4 cm masslike lesion mildly bulges the anterior contour of the left liver lobe, segment 3, not evident prior exams. 4. Persistent shoddy lymphadenopathy centered along gastrohepatic ligament. 5. Combination of these consistent with worsening metastatic disease. 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 Findings are most suspicious for either a pancreatic primary carcinoma or cholangiocarcinoma. 6. Small, right greater than left, pleural effusions with associated dependent atelectasis, new since prior CT. Electronically  Signed   By: Alm Parkins M.D.   On: 01/01/2024 08:25    ROS:  As stated above in the HPI otherwise negative.  Blood pressure (!) 178/77, pulse 72, temperature 98 F (36.7 C), temperature source Oral, resp. rate 13, height 6' 1 (1.854 m), weight 75 kg, SpO2 100%.    PE: Gen: NAD, Alert and Oriented, weak HEENT:  [place]/AT, EOMI, scleral icterus Lungs: CTA Bilaterally CV: RRR without M/G/R ABD: Soft, NTND, +BS Ext: No C/C/E  Assessment/Plan: 1) Hematemesis. 2) Worsened anemia. 3) Biliary ductal dilation. 4) Metastatic colon cancer.   The patient does not report  any overt issues with GERD, but an esophagitis can be the source of his recent hematemesis.  Older patients may not necessarily feel the symptoms of GERD/esophagitis.  Also, being on HD with anticoagulation, will precipitate bleeding.  The ERCP tomorrow will allow for visualization of the upper GI tract as well as addressing the biliary obstruction.  It appears that the stents are now clogged.  With his significant metastatic state, it is unlikely that his lymph nodes will completely regress.  At this point, he will benefit with an uncovered metallic stent.  Plan: 1) ERCP with metallic stent placement. 2) HD after the ERCP.    Ayiden Milliman D 01/01/2024, 3:57 PM

## 2024-01-01 NOTE — Progress Notes (Signed)
 Hospitalist Progress Note:   In the setting of hiccups refractory to previously administered Benadryl , Reglan , as well as the patient's presenting nausea, I have ordered chlorpromazine  25 mg po x 1 dose now.     Eva Pore, DO Hospitalist

## 2024-01-01 NOTE — Plan of Care (Signed)

## 2024-01-01 NOTE — Progress Notes (Addendum)
 Progress Note   Patient: Jeffrey Young FMW:[id number] DOB: Apr 12, 1966 DOA: 12/31/2023  DOS: the patient was seen and examined on 01/01/2024   Brief hospital course:  58 year old male with history of ESRD (MWF), colon cancer s/p partial colectomy, HFpEF, DM, transmetatarsal amputation left foot, anemia (baseline 7-9), presenting with acute upper GI bleed and worsening anemia.  Assessment and Plan:  Acute blood loss anemia - Underlying anemia of chronic disease with baseline 7-9.  Most recently 9.1 on 7/1.  Hemoglobin of 6.0 at dialysis and was given 1 unit at that time.  Hemoglobin on presentation less than 7.  S/p 1 unit PRBCs.  Recheck this morning still showing hemoglobin 6.9.  Will order 1 more unit.  Monitor for ongoing bleeding.  Will recheck CBC in AM.  Possible upper GI bleed - Noted hematemesis prior to presentation with worsening anemia.  Empiric PPI on board.  GI consulted for evaluation later this morning.  Possible EGD.  End-stage renal disease - HD MWF.  Was able to tolerate dialysis prior to coming in.  Hyperbilirubinemia - Elevated on presentation but showing mild improvement this morning.  Etiology unclear.  Was scheduled to go outpatient ERCP with Dr. Phebe 8/15.  CT noting increase in intrahepatic bile duct dilation compared with CT from 10/13/2023.  Appears to be secondary to pancreatic mass or lymphadenopathy.  Patient with previous similar history December 2024 with subsequent need for ERCP and CBD stent.  GI on board for hematemesis.  May warrant evaluation for ERCP after hemoglobin stabilizes.  concern for metastatic disease - History of colon cancer stage IIIc status post partial colon resection.  CT scan noting mass in the pancreas, mass in the anterior left liver lobe, lymphadenopathy, giving concern for worsening metastatic disease.    Chronic HFpEF - Does not appear to be in acute exacerbation.  Monitor closely given blood ordered.  Diabetes mellitus -Insulin   sliding scale on board.  Currently NPO.  Hypertension - Holding medications secondary to GI bleed.  Could reassess after cleared for p.o.  Subjective: Patient resting comfortably this morning.  Somewhat lethargic as he was given sedative medications to help with hiccups and sleep overnight.  Is rousable, denies pain, vomiting, shortness of breath.  Physical Exam:  Vitals:   01/01/24 0915 01/01/24 0930 01/01/24 0945 01/01/24 1013  BP: (!) 154/77 (!) 150/74 (!) 150/74 (!) 159/82  Pulse: 68 69 69 70  Resp: 16 16 16 16   Temp:    98.6 F (37 C)  TempSrc:    Oral  SpO2: 92% 97% 99% 98%  Weight:      Height:        GENERAL:  Alert, pleasant, no acute distress  HEENT:  EOMI CARDIOVASCULAR:  RRR, no murmurs appreciated RESPIRATORY:  Clear to auscultation, no wheezing, rales, or rhonchi GASTROINTESTINAL:  Soft, nontender, nondistended EXTREMITIES:  No LE edema bilaterally NEURO:  No new focal deficits appreciated SKIN:  No rashes noted PSYCH:  Appropriate mood and affect     Data Reviewed:  Imaging Studies: CT ABDOMEN PELVIS WO CONTRAST Result Date: 01/01/2024 CLINICAL DATA:  Abdominal pain. Hyperbilirubinemia. History of obstructive jaundice in December 2024. EXAM: CT ABDOMEN AND PELVIS WITHOUT CONTRAST TECHNIQUE: Multidetector CT imaging of the abdomen and pelvis was performed following the standard protocol without IV contrast. RADIATION DOSE REDUCTION: This exam was performed according to the departmental dose-optimization program which includes automated exposure control, adjustment of the mA and/or kV according to patient size and/or use of iterative reconstruction technique. COMPARISON:  10/13/2023 and older exams. MR with MRCP dated 05/15/2023. FINDINGS: Lower chest: Small, right greater than left, pleural effusions. There is associated dependent lower lobe opacity, right greater than left, consistent with atelectasis. Hepatobiliary: Liver normal in size and overall attenuation  vague 1.4 cm mass bulges the anterior contour of segment 3, not appreciated on prior imaging. No other evidence of a liver mass or focal lesion. There is intra and extrahepatic bile duct dilation despite the presence of biliary stents. This is increased from the prior CT, common hepatic duct measuring 7 8 mm, previously 6 mm. Gallbladder is distended.  No gallstones or wall thickening. Pancreas: Oval mass arises from the anterior superior margin pancreatic body, slightly hyperattenuating to the remaining pancreas, measuring 2.3 x 2.1 x 2.5 cm. This was not evident on prior studies. No other evidence of a pancreatic mass. No inflammation or duct dilation. Spleen: Normal in size without focal abnormality. Adrenals/Urinary Tract: No adrenal mass. Kidneys normal in overall size and position. Bilateral perinephric stranding is similar to the prior CT. Oval hypoattenuating homogeneous mass, mid to lower pole the right kidney, 3.6 cm, stable consistent with a cyst. No follow-up indicated no other defined renal masses, no stones and no hydronephrosis. Ureters are normal in course and in caliber. Bladder is unremarkable. Stomach/Bowel: Stomach unremarkable. Small bowel and colon are normal caliber. No wall thickening. No inflammation. Stable changes from a left transverse colon anastomosis. Vascular/Lymphatic: There are enlarged shoddy gastrohepatic ligament lymph nodes and porta hepatis nodes, measuring up to 1.4 cm in short axis, somewhat better appreciated the current exam, but otherwise not convincingly changed. There are also prominent, but subcentimeter periceliac nodes. No other evidence of lymphadenopathy. No significant vascular abnormality on this unenhanced study. Reproductive: Mild prostate enlargement measuring 5.3 x 3.9 x 4.0 cm, unchanged. Other: No ascites. Musculoskeletal: No fracture or acute finding. No osteoblastic or osteolytic lesions. IMPRESSION: 1. Interval increase intrahepatic bile duct dilation  compared to the CT from 10/13/2023. 2. Apparent mass arising from the anterior superior margin of the pancreatic body, 2.5 cm. This could reflect either a primary pancreatic mass or adjacent enlarged lymph node. 3. Subtle hypoattenuating, 1.4 cm masslike lesion mildly bulges the anterior contour of the left liver lobe, segment 3, not evident prior exams. 4. Persistent shoddy lymphadenopathy centered along gastrohepatic ligament. 5. Combination of these consistent with worsening metastatic disease. 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 Findings are most suspicious for either a pancreatic primary carcinoma or cholangiocarcinoma. 6. Small, right greater than left, pleural effusions with associated dependent atelectasis, new since prior CT. Electronically Signed   By: Alm Parkins M.D.   On: 01/01/2024 08:25   VAS US  DUPLEX DIALYSIS ACCESS (AVF, AVG) Result Date: 12/25/2023 DIALYSIS ACCESS Patient Name:  Avante Carneiro  Date of Exam:   12/25/2023 Medical Rec #: [id number]       Accession #:    [id number] Date of Birth: [birth year]       Patient Gender: M Patient Age:   29 years Exam Location:  [address] Procedure:      VAS US  DUPLEX DIALYSIS ACCESS (AVF, AVG) Referring Phys: DEBBY ROBERTSON --------------------------------------------------------------------------------  Reason for Exam: ESRD. Access Site: Right Upper Extremity. Access Type: Brachial A- Axillary v graft. Comparison Study: N/A Performing Technologist: Dena Pane  Examination Guidelines: A complete evaluation includes B-mode imaging, spectral Doppler, color Doppler, and power Doppler as needed of all accessible portions of each vessel. Unilateral testing is considered an integral  part of a complete examination. Limited examinations for reoccurring indications may be performed as noted.  Findings:   +--------------------+----------+-----------------+---------+ AVG                 PSV  (cm/s)Flow Vol (mL/min)Describe  +--------------------+----------+-----------------+---------+ Native artery inflow   244                               +--------------------+----------+-----------------+---------+ Arterial anastomosis   477                               +--------------------+----------+-----------------+---------+ Prox graft             1041                    narrowing +--------------------+----------+-----------------+---------+ Mid graft              170                               +--------------------+----------+-----------------+---------+ Distal graft           357                               +--------------------+----------+-----------------+---------+ Venous anastomosis     201                               +--------------------+----------+-----------------+---------+ Venous outflow         157                               +--------------------+----------+-----------------+---------+ Narrowing seen at the proximal graft  Summary: Patent right brachial artery to axillary vein arteriovenous graft. Narrowing is visualized in the proximal graft with a velocity of 1041 cm/s.  *See table(s) above for measurements and observations.  Diagnosing physician: Debby Robertson Electronically signed by Debby Robertson on 12/25/2023 at 5:33:35 PM.    --------------------------------------------------------------------------------   Final    DG Chest Port 1 View Result Date: 12/06/2023 CLINICAL DATA:  [id number] S/P dialysis catheter insertion (HCC) [id number] EXAM: PORTABLE CHEST - 1 VIEW COMPARISON:  None available. FINDINGS: Right IJ approach hemodialysis catheter in place terminating at the cavoatrial junction. Left chest port is similarly positioned terminating in the right atrium. No focal airspace consolidation, pleural effusion, or pneumothorax. Mild cardiomegaly. Tortuous aorta with aortic atherosclerosis. No acute fracture or destructive lesions. Multilevel  thoracic osteophytosis. IMPRESSION: Right hemodialysis catheter, well-positioned, terminating at the cavoatrial junction. No pneumothorax. Electronically Signed   By: Rogelia Myers M.D.   On: 12/06/2023 14:21   HYBRID OR IMAGING ([facility] ONLY) Result Date: 12/06/2023 There is no interpretation for this exam.  This order is for images obtained during a surgical procedure.  Please See Surgeries Tab for more information regarding the procedure.    There are no new results to review at this time.  Previous records (including but not limited to H&P, progress notes, nursing notes, TOC management) were reviewed in assessment of this patient.  Labs: CBC: Recent Labs  Lab 12/31/23 1806 12/31/23 1834 01/01/24 0251 01/01/24 0846  WBC 9.1  --  6.4  --   NEUTROABS 6.8  --  4.5  --  HGB 7.7* 8.5* 6.9* 6.9*  HCT 23.7* 25.0* 20.7* 20.8*  MCV 92.9  --  91.6  --   PLT 184  --  148*  --    Basic Metabolic Panel: Recent Labs  Lab 12/31/23 1806 12/31/23 1834 01/01/24 0251  NA 133* 134* 133*  K 3.5 3.1* 3.3*  CL 91* 90* 92*  CO2 28  --  28  GLUCOSE 104* 99 102*  BUN 10 10 15   CREATININE 2.09* 1.90* 3.01*  CALCIUM  8.1*  --  7.9*  MG 1.7  --  1.7  PHOS  --   --  1.9*   Liver Function Tests: Recent Labs  Lab 12/31/23 1806 01/01/24 0251  AST 23 23  ALT 27 25  ALKPHOS 399* 332*  BILITOT 6.0* 5.5*  PROT 6.3* 5.3*  ALBUMIN  2.1* 1.9*   CBG: Recent Labs  Lab 12/31/23 2332 01/01/24 0528 01/01/24 0746  GLUCAP 93 105* 111*    Scheduled Meds:  sodium chloride    Intravenous Once   insulin  aspart  0-6 Units Subcutaneous Q6H   pantoprazole  (PROTONIX ) IV  40 mg Intravenous Q12H   Continuous Infusions:  promethazine  (PHENERGAN ) injection (IM or IVPB)     PRN Meds:.acetaminophen  **OR** acetaminophen , fentaNYL  (SUBLIMAZE ) injection, melatonin, naLOXone  (NARCAN )  injection, ondansetron  (ZOFRAN ) IV, promethazine  (PHENERGAN ) injection (IM or IVPB)  Family Communication: None at  bedside  Disposition: Status is: Observation The patient will require care spanning > 2 midnights and should be moved to inpatient because: anemia and GIB     Time spent: 40 minutes  Length of inpatient stay: 0 days  Author: Carliss LELON Canales, DO 01/01/2024 10:33 AM  For on call review [email or web address].

## 2024-01-01 NOTE — Hospital Course (Signed)
 58 year old male with history of ESRD (MWF), colon cancer s/p partial colectomy, HFpEF, DM, transmetatarsal amputation left foot, anemia (baseline 7-9), presenting with acute upper GI bleed and worsening anemia.   Assessment and Plan:   Acute blood loss anemia - Underlying anemia of chronic disease with baseline 7-9.  Most recently 9.1 on 7/1.  Hemoglobin of 6.0 at dialysis and was given 1 unit at that time.  Hemoglobin on presentation less than 7.  S/p 1 unit PRBCs.  Recheck this morning still showing hemoglobin 6.9.  Will order 1 more unit.  Monitor for ongoing bleeding.  Will recheck CBC in AM.   Possible upper GI bleed - Noted hematemesis prior to presentation with worsening anemia.  Empiric PPI on board.  GI consulted for evaluation later this morning.  Possible EGD.   End-stage renal disease - HD MWF.  Was able to tolerate dialysis prior to coming in.   Hyperbilirubinemia - Elevated on presentation but showing mild improvement this morning.  Etiology unclear.  Was scheduled to go outpatient ERCP with Dr. Phebe 8/15.  CT noting increase in intrahepatic bile duct dilation compared with CT from 10/13/2023.  Appears to be secondary to pancreatic mass or lymphadenopathy.  Patient with previous similar history December 2024 with subsequent need for ERCP and CBD stent.  GI on board for hematemesis.  May warrant evaluation for ERCP after hemoglobin stabilizes.   concern for metastatic disease - History of colon cancer stage IIIc status post partial colon resection.  CT scan noting mass in the pancreas, mass in the anterior left liver lobe, lymphadenopathy, giving concern for worsening metastatic disease.     Chronic HFpEF - Does not appear to be in acute exacerbation.  Monitor closely given blood ordered.   Diabetes mellitus -Insulin  sliding scale on board.  Currently NPO.   Hypertension - Holding medications secondary to GI bleed.  Could reassess after cleared for p.o.

## 2024-01-01 NOTE — Consult Note (Signed)
 Jeffrey Young [facility] Consultation Note    Indication for Consultation:  Management of ESRD/hemodialysis, anemia, hypertension/volume, and secondary hyperparathyroidism. PCP:  HPI: Jeffrey Young is a 58 y.o. male with PMH of DM2, chronic HFpEF, hx of colon cancer stage IIIc status post partial colon resection, hyperbilirubinemia, ESRD, HTN, transmetatarsal amputation left foot, anemia.  Patient was at HD on 12/30/23 and they noted that his Hgb had dropped from 8.5 to 6.5. He had episodes of hematemesis. He had a similar episode in December 2024. He has received 2 units of PRBC. He reports that his vomiting has improved. He denies any dyspnea or CP today. He does report that he is fatigued and has not been eating much. Per his OP HD unit, he has been losing weight and had his EDW decreased multiple times. We will plan for HD tomorrow morning to keep him on his regular HD schedule.   Past Medical History:  Diagnosis Date   Arthritis    Hip   CHF (congestive heart failure) (HCC)    Colon cancer (HCC)    Diabetic mononeuropathy associated with type 2 diabetes mellitus (HCC) 03/21/2017   Diabetic retinopathy (HCC) 07/31/2020   Enthesopathy of ankle and tarsus 04/02/2009   Formatting of this note might be different from the original. 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 Type 2 Diabetes Mellitus - Uncomplicated, Uncontrolled   Past Surgical History:  Procedure Laterality Date   AMPUTATION Left 01/22/2018   Procedure: TRANSMETATARSAL AMPUTATION;  Surgeon: Harden Jerona GAILS, MD;  Location: [facility] OR;  Service: Orthopedics;  Laterality: Left;toes   BILIARY STENT PLACEMENT N/A 05/10/2023   Procedure: BILIARY STENT PLACEMENT;  Surgeon: Rollin Dover, MD;  Location: [facility];  Service: Gastroenterology;  Laterality: N/A;   BILIARY STENT PLACEMENT N/A 05/18/2023   Procedure: BILIARY STENT PLACEMENT;  Surgeon: Rollin Dover, MD;  Location: [facility];  Service: Gastroenterology;  Laterality: N/A;   BIOPSY  07/27/2020   Procedure: BIOPSY;  Surgeon: Rollin Dover, MD;  Location: [facility];  Service: Endoscopy;;   COLON RESECTION N/A 07/30/2020   Procedure: HAND ASSISTED LAPAROSCOPIC LEFT HEMI COLECTOMY;  Surgeon: Gladis Cough, MD;  Location: [facility];  Service: General;  Laterality: N/A;   COLONOSCOPY WITH PROPOFOL  N/A 05/24/2018   Procedure: COLONOSCOPY WITH PROPOFOL ;  Surgeon: Rollin Dover, MD;  [facility]: [facility];  Service: Endoscopy;  Laterality: N/A;   COLONOSCOPY WITH PROPOFOL  N/A 07/27/2020   Procedure: COLONOSCOPY WITH PROPOFOL ;  Surgeon: Rollin Dover, MD;  [facility];  Service: Endoscopy;  Laterality: N/A;   DIALYSIS/PERMA CATHETER INSERTION N/A 11/19/2023   Procedure: DIALYSIS/PERMA CATHETER INSERTION;  Surgeon: [name] [name], MD;  Location: [facility];  Service: Cardiovascular;  Laterality: N/A;   ERCP N/A 05/10/2023   Procedure: ENDOSCOPIC RETROGRADE CHOLANGIOPANCREATOGRAPHY (ERCP);  Surgeon: Rollin Dover, MD;  Location:  [facility];  Service: Gastroenterology;  Laterality: N/A;   ERCP N/A 05/18/2023   Procedure: ENDOSCOPIC RETROGRADE CHOLANGIOPANCREATOGRAPHY (ERCP);  Surgeon: Rollin Dover, MD;  Location: [facility];  Service:  Gastroenterology;  Laterality: N/A;   ESOPHAGOGASTRODUODENOSCOPY Left 04/05/2021   Procedure: ESOPHAGOGASTRODUODENOSCOPY (EGD);  Surgeon: Rollin Dover, MD;  Location: [facility];  Service: Endoscopy;  Laterality: Left;   ESOPHAGOGASTRODUODENOSCOPY N/A 05/10/2023   Procedure: ESOPHAGOGASTRODUODENOSCOPY (EGD);  Surgeon: Rollin Dover, MD;  Location: [facility];  Service: Gastroenterology;  Laterality: N/A;   EUS N/A 05/10/2023   Procedure: UPPER ENDOSCOPIC ULTRASOUND (EUS) LINEAR;  Surgeon: Rollin Dover, MD;  [facility];  Service: Gastroenterology;  Laterality: N/A;   FINE NEEDLE ASPIRATION N/A 05/10/2023   Procedure: FINE NEEDLE ASPIRATION (FNA) LINEAR;  Surgeon: Rollin Dover, MD;  [facility]: [facility];  Service: Gastroenterology;  Laterality: N/A;   INSERTION OF ARTERIOVENOUS (AV) ARTEGRAFT ARM Right 12/06/2023   Procedure: INSERTION, GRAFT, ARTERIOVENOUS, UPPER EXTREMITY;  Surgeon: [name] [name], MD;  Location: [facility];  Service: Vascular;  Laterality: Right;   INSERTION OF DIALYSIS CATHETER Right 12/06/2023   Procedure: EXCHANGE OF DIALYSIS CATHETER USING PALINDROME 23CM CATHETER KIT;  Surgeon: [name] [name], MD;  Location: [facility] OR;  Service: Vascular;  Laterality: Right;   POLYPECTOMY  05/24/2018   Procedure: POLYPECTOMY;  Surgeon: Rollin Dover, MD;  [facility];  Service: Endoscopy;;   PORTACATH PLACEMENT Left 08/24/2020   Procedure: INSERTION PORT-A-CATH;  Surgeon: Gladis Cough, MD;  Location: [facility];  Service: General;  Laterality: Left;  75/rm1   SPHINCTEROTOMY  05/10/2023   Procedure: ANNETT;  Surgeon: Rollin Dover, MD;  Location: [facility];  Service: Gastroenterology;;   ANNETT  05/18/2023   Procedure: ANNETT;  Surgeon: Rollin Dover, MD;  Location: [facility];  Service: Gastroenterology;;   CLEDA REMOVAL  05/18/2023   Procedure: STENT REMOVAL;  Surgeon: Rollin Dover, MD;  [facility];  Service:  Gastroenterology;;   SUBMUCOSAL TATTOO INJECTION  07/27/2020   Procedure: SUBMUCOSAL TATTOO INJECTION;  Surgeon: Rollin Dover, MD;  [facility];  Service: Endoscopy;;   Family History  Problem Relation Age of Onset   Hypertension Father    Social History:  reports that he has never smoked. He has never used smokeless tobacco. He reports current alcohol use of about 1.0 standard drink of alcohol per week. He reports that he does not use drugs.  Physical Exam: Vitals:   01/01/24 0945 01/01/24 1013 01/01/24 1200 01/01/24 1217  BP: (!) 150/74 (!) 159/82  (!) 178/77  Pulse: 69 70  72  Resp: 16 16  13   Temp:  98.6 F (37 C) 98.7 F (37.1 C) 98.2 F (36.8 C)  TempSrc:  Oral Oral Oral  SpO2: 99% 98%  100%  Weight:      Height:         GENERAL:  Alert, pleasant, no acute distress  HEENT:  EOMI CARDIOVASCULAR:  RRR, no murmurs appreciated RESPIRATORY:  Clear to auscultation, no wheezing, rales, or rhonchi GASTROINTESTINAL:  Soft, nontender, nondistended EXTREMITIES:  No LE edema bilaterally NEURO:  No new focal deficits appreciated SKIN:  No rashes noted PSYCH:  Appropriate mood and affect Dialysis Access: TDC with maturing AVG  Allergies  Allergen Reactions   Bee Venom Anaphylaxis, Swelling and Other (See Comments)    Cold Sweats, also   Latex Rash and Dermatitis   Prior to Admission medications   Medication Sig Start Date End Date Taking? Authorizing Provider  amLODipine  (NORVASC ) 10 MG tablet Take 10  mg by mouth daily. 10/04/23  Yes [provider]  baclofen  (LIORESAL ) 10 MG tablet Take 10 mg by mouth. 12/21/23  Yes [provider]  carvedilol  (COREG ) 12.5 MG tablet Take 12.5 mg by mouth 2 (two) times daily with a meal. 08/23/22  Yes [provider]  LANTUS  SOLOSTAR 100 UNIT/ML Solostar Pen Inject 10-15 Units into the skin See admin instructions. Inject 15 units into the skin in the morning and 10 units into the skin at bedtime   Yes [provider]  loperamide (IMODIUM) 2 MG capsule Take 2 mg by mouth daily as needed for diarrhea or loose stools.   Yes [provider]  oxyCODONE  (ROXICODONE ) 5 MG immediate release tablet Take 1 tablet (5 mg total) by mouth every 6 (six) hours as needed for severe pain (pain score 7-10). 12/06/23  Yes Schuh, McKenzi P, PA-C  prochlorperazine  (COMPAZINE ) 10 MG tablet Take 1 tablet (10 mg total) by mouth every 6 (six) hours as needed for nausea or vomiting. 10/11/20  Yes Cloretta Arley NOVAK, MD  ACCU-CHEK GUIDE test strip USE AS INSTRUCTED TO CHECK BLOOD SUGAR 2 TIMES DAILY 10/18/20   [provider]  Continuous Glucose Sensor (DEXCOM G6 SENSOR) MISC Inject 1 Device into the skin See admin instructions. Place a new sensor into the skin every 10 days    [provider]  Continuous Glucose Transmitter (DEXCOM G6 TRANSMITTER) MISC SMARTSIG:Every 3 Months 09/25/23   [provider]  losartan (COZAAR) 50 MG tablet Take 25 mg by mouth. 12/24/23   [provider]   Current Facility-Administered Medications  Medication Dose Route Frequency Provider Last Rate Last Admin   acetaminophen  (TYLENOL ) tablet 650 mg  650 mg Oral Q6H PRN Howerter, Justin B, DO       Or   acetaminophen  (TYLENOL ) suppository 650 mg  650 mg Rectal Q6H PRN Howerter, Justin B, DO       fentaNYL  (SUBLIMAZE ) injection 25 mcg  25 mcg Intravenous Q2H PRN Howerter, Justin B, DO       hydrALAZINE  (APRESOLINE ) injection 10 mg  10 mg Intravenous Q6H PRN Arlon, Derek W, DO       insulin  aspart (novoLOG ) injection 0-6 Units  0-6 Units Subcutaneous Q6H Howerter, Justin B, DO       melatonin tablet 3 mg  3 mg Oral QHS PRN Howerter, Justin B, DO       naloxone  (NARCAN ) injection 0.4 mg  0.4 mg Intravenous PRN Howerter, Justin B, DO       ondansetron  (ZOFRAN ) injection 4 mg  4 mg Intravenous Q6H PRN Howerter, Justin B, DO       pantoprazole  (PROTONIX ) injection 40 mg  40 mg Intravenous Q12H Howerter, Justin B,  DO   40 mg at 01/01/24 1040   promethazine  (PHENERGAN ) 6.25 mg/NS 50 mL IVPB  6.25 mg Intravenous Q6H PRN Arlon Carliss ORN, DO       Facility-Administered Medications Ordered in Other Encounters  Medication Dose Route Frequency Provider Last Rate Last Admin   sodium chloride  flush (NS) 0.9 % injection 10 mL  10 mL Intracatheter PRN Cloretta Arley NOVAK, MD   10 mL at 11/17/21 1153   Labs: Basic Metabolic Panel: Recent Labs  Lab 12/31/23 1806 12/31/23 1834 01/01/24 0251  NA 133* 134* 133*  K 3.5 3.1* 3.3*  CL 91* 90* 92*  CO2 28  --  28  GLUCOSE 104* 99 102*  BUN 10 10 15   CREATININE 2.09* 1.90* 3.01*  CALCIUM  8.1*  --  7.9*  PHOS  --   --  1.9*   Liver Function Tests: Recent Labs  Lab 12/31/23 1806 01/01/24 0251  AST 23 23  ALT 27 25  ALKPHOS 399* 332*  BILITOT 6.0* 5.5*  PROT 6.3* 5.3*  ALBUMIN  2.1* 1.9*   No results for input(s): LIPASE, AMYLASE in the last 168 hours. No results for input(s): AMMONIA in the last 168 hours. CBC: Recent Labs  Lab 12/31/23 1806 12/31/23 1834 01/01/24 0251 01/01/24 0846  WBC 9.1  --  6.4  --   NEUTROABS 6.8  --  4.5  --   HGB 7.7* 8.5* 6.9* 6.9*  HCT 23.7* 25.0* 20.7* 20.8*  MCV 92.9  --  91.6  --   PLT 184  --  148*  --    CBG: Recent Labs  Lab 12/31/23 2332 01/01/24 0528 01/01/24 0746 01/01/24 1155  GLUCAP 93 105* 111* 105*   Iron Studies:  Recent Labs    12/31/23 1806  IRON 108  TIBC 172*  FERRITIN 1,554*   Dialysis Orders: [facility] MWF EDW 74 kg - reported that he is losing weight and has poor appetite 4 hours 3K/2.5 Ca bath 400/600 No esa due to malignancy Hectorol 1 mcg  Assessment/Plan:  Upper GI bleed: at patient's HD unit, they noted a drop in his Hgb from 8.5 - 6.5. Patient reported he had hematemesis prior to coming to the hospital. GI consult pending. Given 2 U of PRBC  ESRD:  On HD at [facility] clinic. I called to get his HD orders. Using [facility] and has R AVG that is  maturing HFpEF: does not appear to be volume overloaded Hyperbilirubinemia: elevated on presentation but showing mild improvement. CT noted increase in intrahepatic vile duct dilation compared from CT 10/13/23. Appears to be second to pancreatic mass or lymphadenopathy. May warrant from ERCP after Hgb stabilizes.   Hypertension/volume: Reported that he has been losing weight from his home unit. He has had his EDW lowered multiple times. He was recently placed on Mirtazapine. Patient appears euvolemic on exam.   Anemia: Not on ESA due to malignancy. He has received 2 U PRBC so far with this hospitalization. Last Hgb 6.9. Awaiting recheck.   Metabolic bone disease: On hectorol 1 mcg. No calcitriol. Not on phos binder currently due to phos 1.9.    Nutrition:  Albumin  1.9. Starting protein supplements when he is cleared for diet.  DM: on SSI per primary. Patient is currently NPO.   Belvie Och, NP 01/01/2024, 3:26 PM   [facility]

## 2024-01-01 NOTE — ED Notes (Signed)
 Pt transported upstairs on monitor by [place], NT

## 2024-01-02 ENCOUNTER — Encounter (HOSPITAL_COMMUNITY): Payer: Self-pay | Admitting: Internal Medicine

## 2024-01-02 ENCOUNTER — Inpatient Hospital Stay (HOSPITAL_COMMUNITY): Admitting: Anesthesiology

## 2024-01-02 ENCOUNTER — Inpatient Hospital Stay (HOSPITAL_COMMUNITY)

## 2024-01-02 ENCOUNTER — Encounter (HOSPITAL_COMMUNITY)
Admission: EM | Disposition: A | Discharge status: Home / Self Care | Payer: Self-pay | Source: Home / Self Care | Attending: Internal Medicine

## 2024-01-02 DIAGNOSIS — K831 Obstruction of bile duct: Secondary | ICD-10-CM

## 2024-01-02 DIAGNOSIS — I130 Hypertensive heart and chronic kidney disease with heart failure and stage 1 through stage 4 chronic kidney disease, or unspecified chronic kidney disease: Secondary | ICD-10-CM | POA: Diagnosis not present

## 2024-01-02 DIAGNOSIS — I5032 Chronic diastolic (congestive) heart failure: Secondary | ICD-10-CM | POA: Diagnosis not present

## 2024-01-02 DIAGNOSIS — N184 Chronic kidney disease, stage 4 (severe): Secondary | ICD-10-CM

## 2024-01-02 DIAGNOSIS — K922 Gastrointestinal hemorrhage, unspecified: Secondary | ICD-10-CM | POA: Diagnosis not present

## 2024-01-02 HISTORY — PX: ERCP: SHX5425

## 2024-01-02 HISTORY — PX: STENT REMOVAL: SHX6421

## 2024-01-02 HISTORY — PX: BILIARY STENT PLACEMENT: SHX5538

## 2024-01-02 LAB — CBC
HCT: 26.4 % — ABNORMAL LOW (ref 39.0–52.0)
Hemoglobin: 9.3 g/dL — ABNORMAL LOW (ref 13.0–17.0)
MCH: 31 pg (ref 26.0–34.0)
MCHC: 35.2 g/dL (ref 30.0–36.0)
MCV: 88 fL (ref 80.0–100.0)
Platelets: 174 K/uL (ref 150–400)
RBC: 3 MIL/uL — ABNORMAL LOW (ref 4.22–5.81)
RDW: 14.5 % (ref 11.5–15.5)
WBC: 7.2 K/uL (ref 4.0–10.5)
nRBC: 0 % (ref 0.0–0.2)

## 2024-01-02 LAB — BPAM RBC
Blood Product Expiration Date: 202508142359
Blood Product Expiration Date: 202508182359
ISSUE DATE / TIME: 202508120413
ISSUE DATE / TIME: 202508121156
Unit Type and Rh: 6200
Unit Type and Rh: 9500

## 2024-01-02 LAB — BASIC METABOLIC PANEL WITH GFR
Anion gap: 15 (ref 5–15)
BUN: 27 mg/dL — ABNORMAL HIGH (ref 6–20)
CO2: 24 mmol/L (ref 22–32)
Calcium: 8.4 mg/dL — ABNORMAL LOW (ref 8.9–10.3)
Chloride: 95 mmol/L — ABNORMAL LOW (ref 98–111)
Creatinine, Ser: 4.38 mg/dL — ABNORMAL HIGH (ref 0.61–1.24)
GFR, Estimated: 15 mL/min — ABNORMAL LOW (ref >60)
Glucose, Bld: 112 mg/dL — ABNORMAL HIGH (ref 70–99)
Potassium: 3.4 mmol/L — ABNORMAL LOW (ref 3.5–5.1)
Sodium: 134 mmol/L — ABNORMAL LOW (ref 135–145)

## 2024-01-02 LAB — MAGNESIUM: Magnesium: 1.7 mg/dL (ref 1.7–2.4)

## 2024-01-02 LAB — TYPE AND SCREEN
ABO/RH(D): A POS
Antibody Screen: NEGATIVE
Unit division: 0
Unit division: 0

## 2024-01-02 LAB — HEPATITIS B SURFACE ANTIBODY, QUANTITATIVE: Hep B S AB Quant (Post): <3.5 m[IU]/mL — ABNORMAL LOW

## 2024-01-02 LAB — GLUCOSE, CAPILLARY
Glucose-Capillary: 100 mg/dL — ABNORMAL HIGH (ref 70–99)
Glucose-Capillary: 105 mg/dL — ABNORMAL HIGH (ref 70–99)
Glucose-Capillary: 106 mg/dL — ABNORMAL HIGH (ref 70–99)
Glucose-Capillary: 84 mg/dL (ref 70–99)

## 2024-01-02 LAB — HEMOGLOBIN A1C
Hgb A1c MFr Bld: 5.4 % (ref 4.8–5.6)
Mean Plasma Glucose: 108 mg/dL

## 2024-01-02 SURGERY — ERCP, WITH INTERVENTION IF INDICATED
Anesthesia: General

## 2024-01-02 MED ORDER — FENTANYL CITRATE (PF) 100 MCG/2ML IJ SOLN
INTRAMUSCULAR | Status: AC
  Filled 2024-01-02: qty 2

## 2024-01-02 MED ORDER — SODIUM CHLORIDE 0.45 % IV SOLN: INTRAVENOUS | Status: DC | PRN

## 2024-01-02 MED ORDER — LIDOCAINE HCL (PF) 1 % IJ SOLN
5.0000 mL | INTRAMUSCULAR | Status: DC | PRN
Start: 2024-01-02 — End: 2024-01-02

## 2024-01-02 MED ORDER — ONDANSETRON HCL 4 MG/2ML IJ SOLN
INTRAMUSCULAR | Status: DC | PRN
  Administered 2024-01-02 (×2): 4 mg via INTRAVENOUS

## 2024-01-02 MED ORDER — SODIUM CHLORIDE 0.9 % IV SOLN
INTRAVENOUS | Status: AC | PRN
  Administered 2024-01-02 (×2): 500 mL via INTRAVENOUS

## 2024-01-02 MED ORDER — PHENYLEPHRINE HCL-NACL 20-0.9 MG/250ML-% IV SOLN
INTRAVENOUS | Status: DC | PRN
Start: 2024-01-02 — End: 2024-01-02
  Administered 2024-01-02 (×2): 50 ug/min via INTRAVENOUS

## 2024-01-02 MED ORDER — SODIUM CHLORIDE 0.9 % IV SOLN: INTRAVENOUS | Status: DC

## 2024-01-02 MED ORDER — LIDOCAINE 2% (20 MG/ML) 5 ML SYRINGE
INTRAMUSCULAR | Status: DC | PRN
  Administered 2024-01-02 (×2): 60 mg via INTRAVENOUS

## 2024-01-02 MED ORDER — SUGAMMADEX SODIUM 200 MG/2ML IV SOLN
INTRAVENOUS | Status: DC | PRN
  Administered 2024-01-02 (×2): 200 mg via INTRAVENOUS

## 2024-01-02 MED ORDER — HEPARIN SODIUM (PORCINE) 1000 UNIT/ML DIALYSIS: 1000.0000 [IU] | INTRAMUSCULAR | Status: DC | PRN

## 2024-01-02 MED ORDER — DICLOFENAC SUPPOSITORY 100 MG
RECTAL | Status: DC | PRN
  Administered 2024-01-02 (×2): 100 mg via RECTAL

## 2024-01-02 MED ORDER — ROCURONIUM BROMIDE 10 MG/ML (PF) SYRINGE
PREFILLED_SYRINGE | INTRAVENOUS | Status: DC | PRN
  Administered 2024-01-02 (×2): 20 mg via INTRAVENOUS

## 2024-01-02 MED ORDER — CIPROFLOXACIN IN D5W 400 MG/200ML IV SOLN
INTRAVENOUS | Status: AC
  Filled 2024-01-02: qty 200

## 2024-01-02 MED ORDER — DEXAMETHASONE SODIUM PHOSPHATE 10 MG/ML IJ SOLN
INTRAMUSCULAR | Status: DC | PRN
  Administered 2024-01-02 (×2): 5 mg via INTRAVENOUS

## 2024-01-02 MED ORDER — FENTANYL CITRATE (PF) 250 MCG/5ML IJ SOLN
INTRAMUSCULAR | Status: DC | PRN
  Administered 2024-01-02 (×2): 50 ug via INTRAVENOUS

## 2024-01-02 MED ORDER — SODIUM CHLORIDE 0.9 % IV SOLN
INTRAVENOUS | Status: DC | PRN
  Administered 2024-01-02 (×2): 10 mL

## 2024-01-02 MED ORDER — CARVEDILOL 3.125 MG PO TABS
3.1250 mg | ORAL_TABLET | Freq: Two times a day (BID) | ORAL | Status: DC
  Administered 2024-01-02 – 2024-01-03 (×5): 3.125 mg via ORAL
  Filled 2024-01-02 (×3): qty 1

## 2024-01-02 MED ORDER — HYDRALAZINE HCL 20 MG/ML IJ SOLN: 10.0000 mg | Freq: Four times a day (QID) | INTRAMUSCULAR | Status: DC | PRN

## 2024-01-02 MED ORDER — CHLORPROMAZINE HCL 25 MG PO TABS
25.0000 mg | ORAL_TABLET | Freq: Once | ORAL | Status: AC
  Administered 2024-01-03: 25 mg via ORAL
  Filled 2024-01-02: qty 1

## 2024-01-02 MED ORDER — PROPOFOL 10 MG/ML IV BOLUS
INTRAVENOUS | Status: DC | PRN
  Administered 2024-01-02 (×2): 100 mg via INTRAVENOUS

## 2024-01-02 MED ORDER — SUCCINYLCHOLINE CHLORIDE 200 MG/10ML IV SOSY
PREFILLED_SYRINGE | INTRAVENOUS | Status: DC | PRN
  Administered 2024-01-02 (×2): 100 mg via INTRAVENOUS

## 2024-01-02 MED ORDER — LIDOCAINE-PRILOCAINE 2.5-2.5 % EX CREA: 1.0000 | TOPICAL_CREAM | CUTANEOUS | Status: DC | PRN

## 2024-01-02 MED ORDER — MEDIHONEY WOUND/BURN DRESSING EX PSTE
1.0000 | PASTE | Freq: Every day | CUTANEOUS | Status: DC
  Administered 2024-01-02 (×2): 1 via TOPICAL
  Filled 2024-01-02: qty 44

## 2024-01-02 MED ORDER — ALTEPLASE 2 MG IJ SOLR: 2.0000 mg | Freq: Once | INTRAMUSCULAR | Status: DC | PRN

## 2024-01-02 MED ORDER — GLUCAGON HCL RDNA (DIAGNOSTIC) 1 MG IJ SOLR
INTRAMUSCULAR | Status: AC
  Filled 2024-01-02: qty 1

## 2024-01-02 MED ORDER — DICLOFENAC SUPPOSITORY 100 MG
RECTAL | Status: AC
  Filled 2024-01-02: qty 1

## 2024-01-02 MED ORDER — ANTICOAGULANT SODIUM CITRATE 4% (200MG/5ML) IV SOLN: 5.0000 mL | Status: DC | PRN

## 2024-01-02 MED ORDER — CIPROFLOXACIN IN D5W 400 MG/200ML IV SOLN
INTRAVENOUS | Status: DC | PRN
  Administered 2024-01-02 (×2): 400 mg via INTRAVENOUS

## 2024-01-02 MED ORDER — PENTAFLUOROPROP-TETRAFLUOROETH EX AERO: 1.0000 | INHALATION_SPRAY | CUTANEOUS | Status: DC | PRN

## 2024-01-02 NOTE — Consult Note (Addendum)
 WOC Nurse Consult Note: Reason for Consult: R thigh wound  Wound type:  full thickness likely r/t trauma (appears to have started as a skin tear)  Pressure Injury POA: not pressure  Measurement: see nursing flowsheet  Wound bed: 80% dark necrotic 20% pink dry  Drainage (amount, consistency, odor) see nursing flowsheet  Periwound: intact  Dressing procedure/placement/frequency: Cleanse R trochanter/thigh wound with NS, apply Medihoney to wound bed daily, cover with dry gauze and secure with silicone foam.    POC discussed with bedside nurse.   Patient also has a wound on the L foot but refuses to have dressing removed to take photo.  Patient  has this followed at [facility] and I see history of Cadexomer Iodine  use (which is not on formulary at [facility])  Will have WOC nurse attempt to see in person to assess this wound and provide orders for care.   Thank you,    Powell Bar MSN, RN-BC, [employer]

## 2024-01-02 NOTE — Consult Note (Signed)
 WOC Nurse Consult Note: Reason for Consult:LEft plantar foot neuropathic ulcer.  History of transmetatarsal amputation. Wound to right lateral thigh Wound type: neuropathic Pressure Injury POA: NA Measurement: Left foot:  4 cm round darkened lesion, surrounded by callous.  Wound bed: darkened Drainage (amount, consistency, odor) None Periwound:History TMA to foot.  Dressing procedure/placement/frequency: Cleanse wound to left plantar foot with NS and pat dry. Apply betadine  to lesion.  May leave open to air or cover with dry dressing if patient prefers.  Will not follow at this time.  Please re-consult if needed.  Darice Cooley MSN, RN, FNP-BC CWON Wound, Ostomy, Continence Nurse Outpatient [facility] [phone] Pager [phone]

## 2024-01-02 NOTE — Progress Notes (Signed)
 Jeffrey Young Progress Note   Subjective:   Patient seen and examined in his room. He was very sleepy. He denies any dyspnea or CP. Patient still NPO for planned ERCP and stent placement today. Plan for HD after procedure. I called his wife, Jeffrey Young, and discussed this with her. She was appreciative of this. Will see him in HD later.   Objective Vitals:   01/01/24 1900 01/01/24 2300 01/02/24 0002 01/02/24 0537  BP: (!) 184/78 (!) 141/62 (!) 155/72 (!) 162/75  Pulse: 74 75 73 70  Resp: 17 20 17 20   Temp: 98.4 F (36.9 C) 98.1 F (36.7 C) 98.6 F (37 C) 98.7 F (37.1 C)  TempSrc: Oral Oral Oral Oral  SpO2: 98% 97% 98% 96%  Weight:      Height:       GENERAL:  Alert, pleasant, no acute distress, resting in bed HEENT:  EOMI CARDIOVASCULAR:  RRR, no murmurs appreciated RESPIRATORY:  Clear to auscultation, no wheezing, rales, or rhonchi GASTROINTESTINAL:  Soft, nontender, nondistended EXTREMITIES:  No LE edema bilaterally NEURO:  No new focal deficits appreciated SKIN:  No rashes noted PSYCH:  Appropriate mood and affect Dialysis Access: [facility] with maturing AVG ready for use 01/17/24  Additional Objective Labs: Basic Metabolic Panel: Recent Labs  Lab 12/31/23 1806 12/31/23 1834 01/01/24 0251 01/02/24 0449  NA 133* 134* 133* 134*  K 3.5 3.1* 3.3* 3.4*  CL 91* 90* 92* 95*  CO2 28  --  28 24  GLUCOSE 104* 99 102* 112*  BUN 10 10 15  27*  CREATININE 2.09* 1.90* 3.01* 4.38*  CALCIUM  8.1*  --  7.9* 8.4*  PHOS  --   --  1.9*  --    Liver Function Tests: Recent Labs  Lab 12/31/23 1806 01/01/24 0251  AST 23 23  ALT 27 25  ALKPHOS 399* 332*  BILITOT 6.0* 5.5*  PROT 6.3* 5.3*  ALBUMIN  2.1* 1.9*   No results for input(s): LIPASE, AMYLASE in the last 168 hours. CBC: Recent Labs  Lab 12/31/23 1806 12/31/23 1834 01/01/24 0251 01/01/24 0846 01/02/24 0449  WBC 9.1  --  6.4  --  7.2  NEUTROABS 6.8  --  4.5  --   --   HGB 7.7*   < > 6.9* 6.9* 9.3*   HCT 23.7*   < > 20.7* 20.8* 26.4*  MCV 92.9  --  91.6  --  88.0  PLT 184  --  148*  --  174   < > = values in this interval not displayed.    CBG: Recent Labs  Lab 01/01/24 0746 01/01/24 1155 01/01/24 1751 01/02/24 0000 01/02/24 0536  GLUCAP 111* 105* 112* 105* 106*   Iron Studies:  Recent Labs    12/31/23 1806  IRON 108  TIBC 172*  FERRITIN 1,554*    Medications:  sodium chloride  20 mL/hr at 01/02/24 1017   promethazine  (PHENERGAN ) injection (IM or IVPB)      carvedilol   3.125 mg Oral BID WC   Chlorhexidine  Gluconate Cloth  6 each Topical Q0600   insulin  aspart  0-6 Units Subcutaneous Q6H   leptospermum manuka honey  1 Application Topical Daily   pantoprazole  (PROTONIX ) IV  40 mg Intravenous Q12H   Dialysis Orders: [facility] MWF EDW 74 kg - reported that he is losing weight and has poor appetite 4 hours 3K/2.5 Ca bath 400/600 No esa due to malignancy Hectorol 1 mcg   Assessment/Plan:  Upper GI bleed: at patient's HD unit,  they noted a drop in his Hgb from 8.5 - 6.5. Patient reported he had hematemesis prior to coming to the hospital. GI following. Given 2 U of PRBC. Planned ERCP with stent placement today with Dr. Rollin.   ESRD:  On HD at [facility] clinic. I called to get his HD orders. Using [facility] and has R AVG that is maturing and will be ready for use 01/17/24. Next HD today after his ERCP.  HFpEF: does not appear to be volume overloaded Hyperbilirubinemia: elevated on presentation but showing mild improvement. CT noted increase in intrahepatic vile duct dilation compared from CT 10/13/23. Appears to be second to pancreatic mass or lymphadenopathy. Planned stent placement today during ERCP.   Hypertension/volume: BP higher today. His home medications were restarted and PRN IV hydralazine  ordered. Reported that he has been losing weight from his home unit. He has had his EDW lowered multiple times. He was recently placed on Mirtazapine. Patient appears  euvolemic on exam.   Anemia: Not on ESA due to malignancy. He has received 2 U PRBC so far with this hospitalization. Last Hgb9.3. No venofer due to high ferritin level.  Metabolic bone disease: On hectorol 1 mcg. No calcitriol. Not on phos binder currently due to phos 1.9.    Nutrition:  Albumin  1.9. Starting protein supplements when he is cleared for diet.  DM: on SSI per primary. Patient is currently NPO.   Jeffrey Och, NP 01/02/2024, 10:49 AM  [facility]

## 2024-01-02 NOTE — Transfer of Care (Signed)
 Immediate Anesthesia Transfer of Care Note  Patient: Jeffrey Young  Procedure(s) Performed: ERCP, WITH INTERVENTION IF INDICATED STENT REMOVAL INSERTION, STENT, BILE DUCT  Patient Location: Endo  Anesthesia Type:General  Level of Consciousness: awake and alert   Airway & Oxygen Therapy: Patient Spontanous Breathing and Patient connected to face mask oxygen  Post-op Assessment: Report given to RN and Post -op Vital signs reviewed and stable  Post vital signs: Reviewed and stable  Last Vitals:  Vitals Value Taken Time  BP 183/85 01/02/24 13:16  Temp    Pulse 64 01/02/24 13:18  Resp 22 01/02/24 13:18  SpO2 100 % 01/02/24 13:18  Vitals shown include unfiled device data.  Last Pain:  Vitals:   01/02/24 1316  TempSrc:   PainSc: 0-No pain         Complications: No notable events documented.

## 2024-01-02 NOTE — Anesthesia Procedure Notes (Signed)
 Procedure Name: Intubation Date/Time: 01/02/2024 12:20 PM  Performed by: Julien Manus, CRNAPre-anesthesia Checklist: Patient identified, Emergency Drugs available, Suction available and Patient being monitored Patient Re-evaluated:Patient Re-evaluated prior to induction Oxygen Delivery Method: Circle system utilized Preoxygenation: Pre-oxygenation with 100% oxygen Induction Type: Rapid sequence Ventilation: Mask ventilation without difficulty Laryngoscope Size: Miller and 2 Grade View: Grade I Tube type: Oral Tube size: 7.5 mm Number of attempts: 1 Airway Equipment and Method: Stylet and Oral airway Placement Confirmation: ETT inserted through vocal cords under direct vision, positive ETCO2 and breath sounds checked- equal and bilateral Secured at: 21 cm Tube secured with: Tape Dental Injury: Teeth and Oropharynx as per pre-operative assessment

## 2024-01-02 NOTE — Progress Notes (Signed)
 Received patient in bed to unit.  Alert and oriented.  Informed consent signed and in chart.   TX duration:3:15  Patient tolerated well.  Transported back to the room  Alert, without acute distress.  Hand-off given to patient's nurse.   Access used: Catheter Access issues: NA  Total UF removed: 1L Medication(s) given: NA Post HD weight: 70.6KG   01/02/24 1847  Vitals  Temp 98 F (36.7 C)  Temp Source Oral  BP 120/70  MAP (mmHg) 85  Pulse Rate 62  ECG Heart Rate 62  Resp 16  Weight 70.6 kg  Type of Weight Post-Dialysis  Oxygen Therapy  SpO2 100 %  O2 Device Room Air  During Treatment Monitoring  Blood Flow Rate (mL/min) 199 mL/min  Arterial Pressure (mmHg) -80.8 mmHg  Venous Pressure (mmHg) 78.78 mmHg  TMP (mmHg) 0.61 mmHg  Ultrafiltration Rate (mL/min) 506 mL/min  Dialysate Flow Rate (mL/min) 299 ml/min  Dialysate Potassium Concentration 3  Dialysate Calcium  Concentration 2.5  Duration of HD Treatment -hour(s) 3.25 hour(s)  Cumulative Fluid Removed (mL) per Treatment  1000.15  HD Safety Checks Performed Yes  Intra-Hemodialysis Comments Tx completed  Post Treatment  Dialyzer Clearance Clear  Liters Processed 78  Fluid Removed (mL) 1000 mL  Tolerated HD Treatment Yes  Hemodialysis Catheter Right Subclavian Double lumen Permanent (Tunneled)  Placement Date/Time: 12/06/23 1157   Placed prior to admission: No  Serial / Lot #: [id number]  Expiration Date: 05/22/27  Time Out: Correct patient;Correct site;Correct procedure  Maximum sterile barrier precautions: Hand hygiene;Mask;Sterile gloves;S...  Site Condition No complications  Blue Lumen Status Dead end cap in place  Red Lumen Status Dead end cap in place  Catheter fill solution Heparin  1000 units/ml  Dressing Type Transparent  Dressing Status Antimicrobial disc/dressing in place  Drainage Description None  Dressing Change Due 01/07/24  Post treatment catheter status Capped and Clamped      [facility]

## 2024-01-02 NOTE — Op Note (Signed)
 [facility] Patient Name: Jeffrey Young Procedure Date : 01/02/2024 MRN: [id number] Attending MD: Belvie Just , MD, [id number] Date of Birth: 09-Mar-1966 CSN: [id number] Age: 58 Admit Type: Inpatient Procedure:                ERCP Indications:              Malignant stricture of the common bile duct Providers:                Belvie Just, MD, Ozell Pouch, Curtistine Bishop, Technician, Farris Southgate, Technician Referring MD:              Medicines:                General Anesthesia Complications:            No immediate complications. Estimated Blood Loss:     Estimated blood loss: none. Procedure:                Pre-Anesthesia Assessment:                           - Prior to the procedure, a History and Physical                            was performed, and patient medications and                            allergies were reviewed. The patient's tolerance of                            previous anesthesia was also reviewed. The risks                            and benefits of the procedure and the sedation                            options and risks were discussed with the patient.                            All questions were answered, and informed consent                            was obtained. Prior Anticoagulants: The patient has                            taken no anticoagulant or antiplatelet agents. ASA                            Grade Assessment: IV - A patient with severe                            systemic disease that is a constant threat to life.  After reviewing the risks and benefits, the patient                            was deemed in satisfactory condition to undergo the                            procedure.                           - Sedation was administered by an anesthesia                            professional. General anesthesia was attained.                           After obtaining  informed consent, the scope was                            passed under direct vision. Throughout the                            procedure, the patient's blood pressure, pulse, and                            oxygen saturations were monitored continuously. The                            [id number]-[id number] ([id number] Olympus duodenoscope was                            introduced through the mouth, and used to inject                            contrast into and used to inject contrast into the                            bile duct. The ERCP was somewhat difficult. The                            patient tolerated the procedure well. Scope In: Scope Out: Findings:      Two plastic stents originating in the biliary tree were emerging from       the major papilla. The stents were visibly occluded. The bile duct was       deeply cannulated with the short-nosed traction sphincterotome. Contrast       was injected. I personally interpreted the bile duct images. There was       brisk flow of contrast through the ducts. Image quality was adequate.       Contrast extended to the bifurcation. The common bile duct contained a       single segmental stenosis 30 mm in length. The common bile duct was       moderately dilated and diffusely dilated, with an obstruction. The       largest diameter was 12 mm. A short 0.035 inch Soft Raymon was passed  into the biliary tree. The biliary tree contained one plastic stent.       This was found to be visibly occluded. The stent was removed using a       snare. A 7 Fr by 6 cm uncovered metal stent was placed 5 cm into the       biliary tree. Bile flowed through the stent. The stent was in good       position.      Evaluation of the upper GI tract mucosa only revealed a mild distal       esophagitis. There was no evidence of any active bleeding or any source       of bleeding from the upper GI tract. It is likely that he is bleeding       from the oral wounds. The  duodenoscope had difficulty passing through       the pylorus. Extrinsic compression allowed for the endoscope to move       into the duodenum. Two occluded plastic biliary stents were noted and       there was an associated clot coming from the bile duct. Both stents were       removed sequentially with a snare. Cannulation was moderately difficult       as the CBD lumen occludded after stent removal. With different       positionings the guidewire was secured in the right intrahepatic ducts.       Contrast injection revealed a 3 cm stenosis in the distal mid portion of       the CBD and the proximal portion of the distal CBD. There was also some       evidence of mucosal irregularity in the proximal CBD. A 10 mm x 60 mm       uncovered metallic stent was easily advanced over the guidewire. The       stent was deloyed in excellent position and there was rapid drainage of       dark bile. Fluoroscopic images were obtained to confirm the positioning. Impression:               - Two visibly occluded stents from the biliary tree                            were seen in the major papilla.                           - A single segmental biliary stricture was found in                            the common bile duct. The stricture was malignant                            appearing.                           - The common bile duct was moderately dilated, with                            an obstruction.                           -  One stent was exchanged in the biliary tree. Recommendation:           - Return patient to hospital ward for ongoing care.                           - Resume regular diet.                           - Further treatment per Oncology. Procedure Code(s):        --- Professional ---                           [phone], Endoscopic retrograde                            cholangiopancreatography (ERCP); with removal and                            exchange of stent(s), biliary or  pancreatic duct,                            including pre- and post-dilation and guide wire                            passage, when performed, including sphincterotomy,                            when performed, each stent exchanged                           [phone], Endoscopic catheterization of the biliary                            ductal system, radiological supervision and                            interpretation Diagnosis Code(s):        --- Professional ---                           K83.1, Obstruction of bile duct                           T85.590A, Other mechanical complication of bile                            duct prosthesis, initial encounter CPT copyright 2022 [employer]. All rights reserved. The codes documented in this report are preliminary and upon coder review may  be revised to meet current compliance requirements. Belvie Just, MD Belvie Just, MD 01/02/2024 1:14:17 PM This report has been signed electronically. Number of Addenda: 0

## 2024-01-02 NOTE — Progress Notes (Signed)
 OT Cancellation Note  Patient Details Name: Jeffrey Young MRN: [id number] DOB: 09/25/1965   Cancelled Treatment:    Reason Eval/Treat Not Completed: (P) Patient declined, Pt tired, just got back from a procedure, asked to return tomorrow.  Elouise JONELLE Bott 01/02/2024, 2:20 PM

## 2024-01-02 NOTE — Anesthesia Preprocedure Evaluation (Addendum)
 Anesthesia Evaluation  Patient identified by MRN, date of birth, ID band Patient awake    Reviewed: Allergy & Precautions, NPO status , Patient's Chart, lab work & pertinent test results, reviewed documented beta blocker date and time   History of Anesthesia Complications Negative for: history of anesthetic complications  Airway Mallampati: II  TM Distance: >3 FB Neck ROM: Full    Dental  (+) Dental Advisory Given, Edentulous Upper, Partial Lower   Pulmonary neg pulmonary ROS   Pulmonary exam normal        Cardiovascular hypertension, Pt. on medications and Pt. on home beta blockers Normal cardiovascular exam   '22 TTE - EF 60 to 65%. There is mild left ventricular hypertrophy. Grade I diastolic dysfunction (impaired relaxation). No significant valvular d/o identified.     Neuro/Psych  Neuromuscular disease  negative psych ROS   GI/Hepatic negative GI ROS, Neg liver ROS,,,  Endo/Other  diabetes, Type 2, Insulin  Dependent    Renal/GU Dialysis and ESRFRenal disease     Musculoskeletal  (+) Arthritis ,    Abdominal   Peds  Hematology  (+) Blood dyscrasia, anemia   Anesthesia Other Findings   Reproductive/Obstetrics                              Anesthesia Physical Anesthesia Plan  ASA: 3  Anesthesia Plan: General   Post-op Pain Management: Minimal or no pain anticipated   Induction: Intravenous  PONV Risk Score and Plan: 2 and Treatment may vary due to age or medical condition, Ondansetron , Dexamethasone  and Midazolam   Airway Management Planned: Oral ETT  Additional Equipment: None  Intra-op Plan:   Post-operative Plan: Extubation in OR  Informed Consent: I have reviewed the patients History and Physical, chart, labs and discussed the procedure including the risks, benefits and alternatives for the proposed anesthesia with the patient or authorized representative who has  indicated his/her understanding and acceptance.     Dental advisory given  Plan Discussed with: CRNA and Anesthesiologist  Anesthesia Plan Comments:          Anesthesia Quick Evaluation

## 2024-01-02 NOTE — Anesthesia Postprocedure Evaluation (Signed)
 Anesthesia Post Note  Patient: Jeffrey Young  Procedure(s) Performed: ERCP, WITH INTERVENTION IF INDICATED STENT REMOVAL INSERTION, STENT, BILE DUCT     Patient location during evaluation: PACU Anesthesia Type: General Level of consciousness: awake and alert Pain management: pain level controlled Vital Signs Assessment: post-procedure vital signs reviewed and stable Respiratory status: spontaneous breathing, nonlabored ventilation and respiratory function stable Cardiovascular status: stable and blood pressure returned to baseline Anesthetic complications: no   No notable events documented.  Last Vitals:  Vitals:   01/02/24 1320 01/02/24 1330  BP: (!) 185/88 (!) 166/82  Pulse: 62 (!) 59  Resp: 18 11  Temp:    SpO2: 100% 99%    Last Pain:  Vitals:   01/02/24 1330  TempSrc:   PainSc: 0-No pain                 Debby FORBES Like

## 2024-01-02 NOTE — Interval H&P Note (Signed)
 History and Physical Interval Note:  01/02/2024 11:58 AM  Jeffrey Young  has presented today for surgery, with the diagnosis of Biliary stricture.  The various methods of treatment have been discussed with the patient and family. After consideration of risks, benefits and other options for treatment, the patient has consented to  Procedure(s): ERCP, WITH INTERVENTION IF INDICATED (N/A) as a surgical intervention.  The patient's history has been reviewed, patient examined, no change in status, stable for surgery.  I have reviewed the patient's chart and labs.  Questions were answered to the patient's satisfaction.     Goldia Ligman D

## 2024-01-02 NOTE — Evaluation (Signed)
 Physical Therapy Evaluation Patient Details Name: Jeffrey Young MRN: [id number] DOB: 12-22-1965 Today's Date: 01/02/2024  History of Present Illness  58 year old male presenting with acute upper GI bleed and worsening anemia. Past history of ESRD (MWF), colon cancer s/p partial colectomy, HFpEF, DM, transmetatarsal amputation left foot, anemia (baseline 7-9).  Clinical Impression  Pt presents with admitting diagnosis above. Pt today was able to ambulate in hallway with RW at supervision level. PTA pt reports that he was independent with mobility however wife assisted with ADLs. Recommend HHPT upon DC. Patient needs to practice stairs next session. PT will continue to follow.         If plan is discharge home, recommend the following: A little help with walking and/or transfers;A little help with bathing/dressing/bathroom;Assistance with cooking/housework;Direct supervision/assist for medications management;Assist for transportation;Help with stairs or ramp for entrance;Supervision due to cognitive status   Can travel by private vehicle        Equipment Recommendations None recommended by PT  Recommendations for Other Services  OT consult    Functional Status Assessment Patient has had a recent decline in their functional status and demonstrates the ability to make significant improvements in function in a reasonable and predictable amount of time.     Precautions / Restrictions Precautions Precautions: Fall Recall of Precautions/Restrictions: Intact Restrictions Weight Bearing Restrictions Per Provider Order: No      Mobility  Bed Mobility Overal bed mobility: Modified Independent                  Transfers Overall transfer level: Needs assistance Equipment used: Rolling walker (2 wheels) Transfers: Sit to/from Stand Sit to Stand: Contact guard assist           General transfer comment: Attempted to stand without AD however pt unable to complete sit to stand.  Pt given RW however pt noted to press BLEs against EOB.    Ambulation/Gait Ambulation/Gait assistance: Supervision, Contact guard assist Gait Distance (Feet): 100 Feet Assistive device: Rolling walker (2 wheels) Gait Pattern/deviations: Trunk flexed, Knee hyperextension - right, Decreased stride length, Step-through pattern Gait velocity: decreased     General Gait Details: Cues for proximity to RW. Pt noted with R knee hyperextension that pt reports is baseline.  Stairs            Wheelchair Mobility     Tilt Bed    Modified Rankin (Stroke Patients Only)       Balance Overall balance assessment: Needs assistance Sitting-balance support: No upper extremity supported, Feet supported Sitting balance-Leahy Scale: Good     Standing balance support: Bilateral upper extremity supported, No upper extremity supported, During functional activity Standing balance-Leahy Scale: Poor Standing balance comment: Reliant on RW                             Pertinent Vitals/Pain Pain Assessment Pain Assessment: No/denies pain    Home Living Family/patient expects to be discharged to:: Private residence Living Arrangements: Spouse/significant other Available Help at Discharge: Family;Available PRN/intermittently Type of Home: House Home Access: Stairs to enter Entrance Stairs-Rails: Can reach both Entrance Stairs-Number of Steps: 3   Home Layout: One level Home Equipment: Pharmacist, hospital (2 wheels)      Prior Function Prior Level of Function : Independent/Modified Independent;Driving;History of Falls (last six months) (1 fall 3 weeks ago where pt states that he his shoe got caught.)  Mobility Comments: Ind ADLs Comments: Wife helps with ADLs     Extremity/Trunk Assessment   Upper Extremity Assessment Upper Extremity Assessment: Generalized weakness    Lower Extremity Assessment Lower Extremity Assessment: Generalized  weakness;LLE deficits/detail LLE Deficits / Details: Previous transmet amputation    Cervical / Trunk Assessment Cervical / Trunk Assessment: Normal  Communication   Communication Communication: No apparent difficulties    Cognition Arousal: Alert Behavior During Therapy: WFL for tasks assessed/performed, Flat affect   PT - Cognitive impairments: Problem solving, Safety/Judgement, Sequencing                       PT - Cognition Comments: Pt with somwhat flat affect at times. Followed commands appropriately however some poor safety awareness at times. Following commands: Intact       Cueing Cueing Techniques: Verbal cues, Tactile cues     General Comments General comments (skin integrity, edema, etc.): VSS on RA    Exercises     Assessment/Plan    PT Assessment Patient needs continued PT services  PT Problem List Decreased strength;Decreased range of motion;Decreased activity tolerance;Decreased balance;Decreased mobility;Decreased coordination;Decreased cognition;Decreased knowledge of use of DME;Decreased safety awareness;Decreased knowledge of precautions;Cardiopulmonary status limiting activity       PT Treatment Interventions DME instruction;Gait training;Stair training;Functional mobility training;Therapeutic activities;Therapeutic exercise;Balance training;Neuromuscular re-education;Cognitive remediation;Patient/family education    PT Goals (Current goals can be found in the Care Plan section)  Acute Rehab PT Goals Patient Stated Goal: to go home PT Goal Formulation: With patient Time For Goal Achievement: 01/16/24 Potential to Achieve Goals: Good    Frequency Min 2X/week     Co-evaluation               AM-PAC PT 6 Clicks Mobility  Outcome Measure Help needed turning from your back to your side while in a flat bed without using bedrails?: None Help needed moving from lying on your back to sitting on the side of a flat bed without using  bedrails?: None Help needed moving to and from a bed to a chair (including a wheelchair)?: A Little Help needed standing up from a chair using your arms (e.g., wheelchair or bedside chair)?: A Little Help needed to walk in hospital room?: A Little Help needed climbing 3-5 steps with a railing? : A Little 6 Click Score: 20    End of Session Equipment Utilized During Treatment: Gait belt Activity Tolerance: Patient tolerated treatment well Patient left: in bed;with call bell/phone within reach;with bed alarm set Nurse Communication: Mobility status PT Visit Diagnosis: Other abnormalities of gait and mobility (R26.89)    Time: 9082-9064 PT Time Calculation (min) (ACUTE ONLY): 18 min   Charges:   PT Evaluation $PT Eval Moderate Complexity: 1 Mod   PT General Charges $$ ACUTE PT VISIT: 1 Visit         Sueellen NOVAK, PT, DPT Acute Rehab Services [phone]   Jarold Macomber 01/02/2024, 10:07 AM

## 2024-01-02 NOTE — Plan of Care (Signed)
  Problem: Coping: Goal: Ability to adjust to condition or change in health will improve Outcome: Progressing   Problem: Health Behavior/Discharge Planning: Goal: Ability to manage health-related needs will improve Outcome: Progressing   Problem: Education: Goal: Knowledge of General Education information will improve Description: Including pain rating scale, medication(s)/side effects and non-pharmacologic comfort measures Outcome: Progressing   Problem: Clinical Measurements: Goal: Diagnostic test results will improve Outcome: Progressing

## 2024-01-02 NOTE — Progress Notes (Addendum)
 Progress Note   Patient: Jeffrey Young FMW:[id number] DOB: [birth year] DOA: 12/31/2023  DOS: the patient was seen and examined on 01/02/2024   Brief hospital course:  58 year old male with history of ESRD (MWF), colon cancer s/p partial colectomy, HFpEF, DM, transmetatarsal amputation left foot, anemia (baseline 7-9), presenting with acute upper GI bleed and worsening anemia.  Assessment and Plan:  Acute blood loss anemia, hematemesis, history of stage III colon cancer with metastasis, possible new pancreatic cancer versus cholangiocarcinoma. - Underlying anemia of chronic disease with baseline 7-9.  Most recently 9.1 on 7/1.  Hemoglobin of 6.0 at dialysis and was given 1 unit at that time.  Hemoglobin on presentation less than 7.  S/p 1 unit PRBCs.  Stable continue to monitor, GI on board continue PPI.  He did have hematemesis prior to admission, also has history of stage III colon cancer, now possible worsening metastatic disease, questionable creatinine versus cholangiocarcinoma. Continue to monitor CBC, going for ERCP on 01/02/2024 defer management of GI issues to the gastroenterology team.  Possible upper GI bleed -kindly see above, noted hematemesis prior to presentation with worsening anemia.  Empiric PPI on board.  GI consulted for evaluation later this morning.  Possible EGD/ERCP as above, GI on board continue to monitor CBC.  End-stage renal disease, mild hypokalemia - HD MWF.  Renal following.  Monitor K levels.  Hyperbilirubinemia - Elevated on presentation but showing mild improvement this morning.  Etiology unclear.  Was scheduled to go outpatient ERCP with Dr. Phebe 8/15.  CT noting increase in intrahepatic bile duct dilation compared with CT from 10/13/2023.  Appears to be secondary to pancreatic mass or lymphadenopathy.  Patient with previous similar history December 2024 with subsequent need for ERCP and CBD stent.  GI on board due for EGD chest ERCP on 01/02/2024.  Chronic  HFpEF - Does not appear to be in acute exacerbation.  Monitor closely given blood ordered.  Hypertension - Low-dose Coreg  and as needed hydralazine   Diabetes mellitus -Insulin  sliding scale on board.  Currently NPO.  Lab Results  Component Value Date   HGBA1C 5.4 12/31/2023   CBG (last 3)  Recent Labs    01/01/24 1751 01/02/24 0000 01/02/24 0536  GLUCAP 112* 105* 106*     Subjective: Patient in bed, appears comfortable, denies any headache, no fever, no chest pain or pressure, no shortness of breath , no abdominal pain. No focal weakness.  Physical Exam:  Vitals:   01/01/24 1900 01/01/24 2300 01/02/24 0002 01/02/24 0537  BP: (!) 184/78 (!) 141/62 (!) 155/72 (!) 162/75  Pulse: 74 75 73 70  Resp: 17 20 17 20   Temp: 98.4 F (36.9 C) 98.1 F (36.7 C) 98.6 F (37 C) 98.7 F (37.1 C)  TempSrc: Oral Oral Oral Oral  SpO2: 98% 97% 98% 96%  Weight:      Height:       Awake Alert, No new F.N deficits, Normal affect [place].AT,PERRAL Supple Neck, No JVD,   Symmetrical Chest wall movement, Good air movement bilaterally, CTAB RRR,No Gallops, Rubs or new Murmurs,  +ve B.Sounds, Abd Soft, No tenderness,   No Cyanosis, Clubbing or edema      Data Reviewed:  ERCP due 01/02/2024.    CT - 1. Interval increase intrahepatic bile duct dilation compared to the CT from 10/13/2023. 2. Apparent mass arising from the anterior superior margin of the pancreatic body, 2.5 cm. This could reflect either a primary pancreatic mass or adjacent enlarged lymph node. 3. Subtle hypoattenuating, 1.4  cm masslike lesion mildly bulges the anterior contour of the left liver lobe, segment 3, not evident prior exams. 4. Persistent shoddy lymphadenopathy centered along gastrohepatic ligament. 5. Combination of these consistent with worsening metastatic disease. 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 Findings are most suspicious for either a pancreatic  primary carcinoma or cholangiocarcinoma. 6. Small, right greater than left, pleural effusions with associated dependent atelectasis, new since prior CT.     Data Review:   Inpatient Medications  Scheduled Meds:  Chlorhexidine  Gluconate Cloth  6 each Topical Q0600   insulin  aspart  0-6 Units Subcutaneous Q6H   leptospermum manuka honey  1 Application Topical Daily   pantoprazole  (PROTONIX ) IV  40 mg Intravenous Q12H   Continuous Infusions:  sodium chloride      promethazine  (PHENERGAN ) injection (IM or IVPB)     PRN Meds:.acetaminophen  **OR** acetaminophen , fentaNYL  (SUBLIMAZE ) injection, hydrALAZINE , melatonin, naLOXone  (NARCAN )  injection, ondansetron  (ZOFRAN ) IV, mouth rinse, promethazine  (PHENERGAN ) injection (IM or IVPB)  DVT Prophylaxis  SCDs Start: 12/31/23 1951   Recent Labs  Lab 12/31/23 1806 12/31/23 1834 01/01/24 0251 01/01/24 0846 01/02/24 0449  WBC 9.1  --  6.4  --  7.2  HGB 7.7* 8.5* 6.9* 6.9* 9.3*  HCT 23.7* 25.0* 20.7* 20.8* 26.4*  PLT 184  --  148*  --  174  MCV 92.9  --  91.6  --  88.0  MCH 30.2  --  30.5  --  31.0  MCHC 32.5  --  33.3  --  35.2  RDW 14.1  --  14.0  --  14.5  LYMPHSABS 1.3  --  1.0  --   --   MONOABS 0.8  --  0.7  --   --   EOSABS 0.1  --  0.1  --   --   BASOSABS 0.0  --  0.0  --   --     Recent Labs  Lab 12/31/23 1806 12/31/23 1834 01/01/24 0251 01/02/24 0449  NA 133* 134* 133* 134*  K 3.5 3.1* 3.3* 3.4*  CL 91* 90* 92* 95*  CO2 28  --  28 24  ANIONGAP 14  --  13 15  GLUCOSE 104* 99 102* 112*  BUN 10 10 15  27*  CREATININE 2.09* 1.90* 3.01* 4.38*  AST 23  --  23  --   ALT 27  --  25  --   ALKPHOS 399*  --  332*  --   BILITOT 6.0*  --  5.5*  --   ALBUMIN  2.1*  --  1.9*  --   INR 1.0  --  1.0  --   HGBA1C 5.4  --   --   --   BNP  --   --  328.5*  --   MG 1.7  --  1.7 1.7  PHOS  --   --  1.9*  --   CALCIUM  8.1*  --  7.9* 8.4*      Recent Labs  Lab 12/31/23 1806 01/01/24 0251 01/02/24 0449  INR 1.0 1.0  --    HGBA1C 5.4  --   --   BNP  --  328.5*  --   MG 1.7 1.7 1.7  CALCIUM  8.1* 7.9* 8.4*    --------------------------------------------------------------------------------------------------------------- Lab Results  Component Value Date   CHOL 152 07/27/2020   HDL 36 (L) 07/27/2020   LDLCALC 94 07/27/2020   TRIG 109 07/27/2020   CHOLHDL 4.2 07/27/2020    Lab  Results  Component Value Date   HGBA1C 5.4 12/31/2023   No results for input(s): TSH, T4TOTAL, FREET4, T3FREE, THYROIDAB in the last 72 hours. Recent Labs    12/31/23 1806 01/01/24 0251  VITAMINB12  --  1,546*  FOLATE 3.8*  --   FERRITIN 1,554*  --   TIBC 172*  --   IRON 108  --    ------------------------------------------------------------------------------------------------------------------ Cardiac Enzymes No results for input(s): CKMB, TROPONINI, MYOGLOBIN in the last 168 hours.  Invalid input(s): CK  Micro Results No results found for this or any previous visit (from the past 240 hours).  Radiology Reports  CT ABDOMEN PELVIS WO CONTRAST Result Date: 01/01/2024 CLINICAL DATA:  Abdominal pain. Hyperbilirubinemia. History of obstructive jaundice in December 2024. EXAM: CT ABDOMEN AND PELVIS WITHOUT CONTRAST TECHNIQUE: Multidetector CT imaging of the abdomen and pelvis was performed following the standard protocol without IV contrast. RADIATION DOSE REDUCTION: This exam was performed according to the departmental dose-optimization program which includes automated exposure control, adjustment of the mA and/or kV according to patient size and/or use of iterative reconstruction technique. COMPARISON:  10/13/2023 and older exams. MR with MRCP dated 05/15/2023. FINDINGS: Lower chest: Small, right greater than left, pleural effusions. There is associated dependent lower lobe opacity, right greater than left, consistent with atelectasis. Hepatobiliary: Liver normal in size and overall attenuation vague 1.4 cm  mass bulges the anterior contour of segment 3, not appreciated on prior imaging. No other evidence of a liver mass or focal lesion. There is intra and extrahepatic bile duct dilation despite the presence of biliary stents. This is increased from the prior CT, common hepatic duct measuring 7 8 mm, previously 6 mm. Gallbladder is distended.  No gallstones or wall thickening. Pancreas: Oval mass arises from the anterior superior margin pancreatic body, slightly hyperattenuating to the remaining pancreas, measuring 2.3 x 2.1 x 2.5 cm. This was not evident on prior studies. No other evidence of a pancreatic mass. No inflammation or duct dilation. Spleen: Normal in size without focal abnormality. Adrenals/Urinary Tract: No adrenal mass. Kidneys normal in overall size and position. Bilateral perinephric stranding is similar to the prior CT. Oval hypoattenuating homogeneous mass, mid to lower pole the right kidney, 3.6 cm, stable consistent with a cyst. No follow-up indicated no other defined renal masses, no stones and no hydronephrosis. Ureters are normal in course and in caliber. Bladder is unremarkable. Stomach/Bowel: Stomach unremarkable. Small bowel and colon are normal caliber. No wall thickening. No inflammation. Stable changes from a left transverse colon anastomosis. Vascular/Lymphatic: There are enlarged shoddy gastrohepatic ligament lymph nodes and porta hepatis nodes, measuring up to 1.4 cm in short axis, somewhat better appreciated the current exam, but otherwise not convincingly changed. There are also prominent, but subcentimeter periceliac nodes. No other evidence of lymphadenopathy. No significant vascular abnormality on this unenhanced study. Reproductive: Mild prostate enlargement measuring 5.3 x 3.9 x 4.0 cm, unchanged. Other: No ascites. Musculoskeletal: No fracture or acute finding. No osteoblastic or osteolytic lesions. IMPRESSION: 1. Interval increase intrahepatic bile duct dilation compared to the  CT from 10/13/2023. 2. Apparent mass arising from the anterior superior margin of the pancreatic body, 2.5 cm. This could reflect either a primary pancreatic mass or adjacent enlarged lymph node. 3. Subtle hypoattenuating, 1.4 cm masslike lesion mildly bulges the anterior contour of the left liver lobe, segment 3, not evident prior exams. 4. Persistent shoddy lymphadenopathy centered along gastrohepatic ligament. 5. Combination of these consistent with worsening metastatic disease. Patient had a fine-needle aspirate  on 05/10/2023 that revealed adenocarcinoma, obtained during ERCP. Findings are most suspicious for either a pancreatic primary carcinoma or cholangiocarcinoma. 6. Small, right greater than left, pleural effusions with associated dependent atelectasis, new since prior CT. Electronically Signed   By: Alm Parkins M.D.   On: 01/01/2024 08:25      Signature  -   Lavada Stank M.D on 01/02/2024 at 9:01 AM   -  To page go to [email or web address]      Family Communication: None at bedside  Disposition: Inpatient, thereafter Home once stable  Time spent: 40 minutes  Length of inpatient stay: 1 days  Author: Lavada Stank, MD 01/02/2024 9:01 AM  For on call review [email or web address].

## 2024-01-03 ENCOUNTER — Other Ambulatory Visit (HOSPITAL_COMMUNITY): Payer: Self-pay

## 2024-01-03 ENCOUNTER — Encounter: Payer: Self-pay | Admitting: Oncology

## 2024-01-03 ENCOUNTER — Encounter (HOSPITAL_COMMUNITY): Payer: Self-pay | Admitting: Gastroenterology

## 2024-01-03 DIAGNOSIS — K922 Gastrointestinal hemorrhage, unspecified: Secondary | ICD-10-CM | POA: Diagnosis not present

## 2024-01-03 LAB — GLUCOSE, CAPILLARY
Glucose-Capillary: 131 mg/dL — ABNORMAL HIGH (ref 70–99)
Glucose-Capillary: 158 mg/dL — ABNORMAL HIGH (ref 70–99)

## 2024-01-03 LAB — CBC WITH DIFFERENTIAL/PLATELET
Abs Immature Granulocytes: 0.17 K/uL — ABNORMAL HIGH (ref 0.00–0.07)
Basophils Absolute: 0.1 K/uL (ref 0.0–0.1)
Basophils Relative: 1 %
Eosinophils Absolute: 0.2 K/uL (ref 0.0–0.5)
Eosinophils Relative: 3 %
HCT: 26 % — ABNORMAL LOW (ref 39.0–52.0)
Hemoglobin: 8.7 g/dL — ABNORMAL LOW (ref 13.0–17.0)
Immature Granulocytes: 2 %
Lymphocytes Relative: 12 %
Lymphs Abs: 0.9 K/uL (ref 0.7–4.0)
MCH: 30.3 pg (ref 26.0–34.0)
MCHC: 33.5 g/dL (ref 30.0–36.0)
MCV: 90.6 fL (ref 80.0–100.0)
Monocytes Absolute: 0.9 K/uL (ref 0.1–1.0)
Monocytes Relative: 12 %
Neutro Abs: 5.3 K/uL (ref 1.7–7.7)
Neutrophils Relative %: 70 %
Platelets: 159 K/uL (ref 150–400)
RBC: 2.87 MIL/uL — ABNORMAL LOW (ref 4.22–5.81)
RDW: 15 % (ref 11.5–15.5)
WBC: 7.5 K/uL (ref 4.0–10.5)
nRBC: 0 % (ref 0.0–0.2)

## 2024-01-03 MED ORDER — PANTOPRAZOLE SODIUM 40 MG PO TBEC
40.0000 mg | DELAYED_RELEASE_TABLET | Freq: Two times a day (BID) | ORAL | 1 refills | Status: DC
  Filled 2024-01-03: qty 60, 30d supply, fill #0

## 2024-01-03 MED ORDER — CARVEDILOL 3.125 MG PO TABS
3.1250 mg | ORAL_TABLET | Freq: Two times a day (BID) | ORAL | 0 refills | Status: DC
  Filled 2024-01-03: qty 60, 30d supply, fill #0

## 2024-01-03 MED ORDER — PANTOPRAZOLE SODIUM 40 MG PO TBEC: 40.0000 mg | DELAYED_RELEASE_TABLET | Freq: Two times a day (BID) | ORAL | 0 refills | Status: DC

## 2024-01-03 MED ORDER — CARVEDILOL 3.125 MG PO TABS: 3.1250 mg | ORAL_TABLET | Freq: Two times a day (BID) | ORAL | 0 refills | Status: DC

## 2024-01-03 NOTE — Progress Notes (Signed)
 [id number]  RN is currently completing discharge with patient.

## 2024-01-03 NOTE — Progress Notes (Signed)
 D/c orders noted. Contacted op HD clinic, [facility] [place], to inform of pt d/c and expected arrival tomorrow. D/c summary and last nephrology note have been faxed.   Lavanda Mohogany [facility] Dialysis navigator [phone]

## 2024-01-03 NOTE — Plan of Care (Signed)

## 2024-01-03 NOTE — Discharge Summary (Signed)
 Jeffrey Young FMW:[id number] DOB: Nov 03, 1965 DOA: 12/31/2023  PCP: Pura Lenis, MD  Admit date: 12/31/2023  Discharge date: 01/03/2024  Admitted From: Home   Disposition:  Home   Recommendations for Outpatient Follow-up:   Follow up with PCP in 1-2 weeks  PCP Please obtain BMP/CBC, 2 view CXR in 1week,  (see Discharge instructions)   PCP Please follow up on the following pending results:    Home Health: None   Equipment/Devices: None  Consultations: GI, Onc, Renal Discharge Condition: Stable    CODE STATUS: Full    Diet Recommendation: Renal-low carbohydrate diet, 1.5 L fluid restriction per day.  Check CBGs q. ACHS.    Chief Complaint  Patient presents with   abnormal labs     Brief history of present illness from the day of admission and additional interim summary    58 year old male with history of ESRD (MWF), colon cancer s/p partial colectomy, HFpEF, DM, transmetatarsal amputation left foot, anemia (baseline 7-9), presenting with acute upper GI bleed and worsening anemia.                                                                  Hospital Course   Acute blood loss anemia, hematemesis, history of stage III colon cancer with metastasis, possible new pancreatic cancer versus cholangiocarcinoma. - Underlying anemia of chronic disease with baseline 7-9.  Most recently 9.1 on 7/1.  Hemoglobin of 6.0 at dialysis and was given 1 unit at that time.  Hemoglobin on presentation less than 7.  S/p 1 unit PRBCs.  Stable continue to monitor, GI on board continue PPI.  He did have hematemesis prior to admission, also has history of stage III colon cancer, now possible worsening metastatic disease, questionable creatinine versus cholangiocarcinoma. Continue to monitor CBC, and by GI and oncology underwent ERCP on  01/02/2024 as below, case discussed with GI and patient's oncologist Dr. Cloretta, likely this is metastatic colon cancer, patient currently symptom-free will be discharged on PPI with outpatient GI and oncology follow-up within a week.  H&H stable no signs of ongoing bleeding.  Plan discussed with patient and wife at bedside.   ERCP  Impression:               - Two visibly occluded stents from the biliary tree                            were seen in the major papilla.                           - A single segmental biliary stricture was found in  the common bile duct. The stricture was malignant                            appearing.                           - The common bile duct was moderately dilated, with                            an obstruction.                           - One stent was exchanged in the biliary tree. Recommendation:           - Return patient to hospital ward for ongoing care.                           - Resume regular diet.                           - Further treatment per Oncology.   Possible upper GI bleed -kindly see above, noted hematemesis prior to presentation with worsening anemia.  Empiric PPI on board.  GI consulted for evaluation later this morning.  See above   End-stage renal disease, mild hypokalemia - HD MWF.  Renal following.  ON HD per schedule.   Hyperbilirubinemia - Elevated on presentation but showing mild improvement this morning.  Etiology unclear.  Was scheduled to go outpatient ERCP with Dr. Phebe 8/15.  CT noting increase in intrahepatic bile duct dilation compared with CT from 10/13/2023.  Appears to be secondary to pancreatic mass or lymphadenopathy.  Patient with previous similar history December 2024 with subsequent need for ERCP and CBD stent.  GI on board underwent ERCP on 01/02/2024, see report above.  Discussed with Dr. Rollin   Chronic HFpEF - Does not appear to be in acute exacerbation.  Monitor closely given  blood ordered.  Fluid removal via HD placed on fluid restriction upon discharge.   Hypertension - Patient in good control on low-dose Coreg  only, will be discharged on that PCP to monitor and adjust   Diabetes mellitus 2 - Continue home regimen Discharge diagnosis     Principal Problem:   Acute upper GI bleed Active Problems:   DM2 (diabetes mellitus, type 2) (HCC)   Acute on chronic anemia   Hyperbilirubinemia   End-stage renal disease on hemodialysis (HCC)   Chronic diastolic CHF (congestive heart failure) (HCC)   History of essential hypertension    Discharge instructions    Discharge Instructions     Discharge instructions   Complete by: As directed    Follow with Primary MD Pura Lenis, MD also follow-up with your gastroenterologist and oncologist in 7 days   Get CBC, CMP,   -  checked next visit with your primary MD    Activity: As tolerated with Full fall precautions use walker/cane & assistance as needed  Disposition Home    Diet: Renal low carbohydrate diet with 1.5 L fluid restriction per day.  Check CBGs q. ACHS  Special Instructions: If you have smoked or chewed Tobacco  in the last 2 yrs please stop smoking, stop any regular Alcohol  and or any Recreational drug use.  On your next visit  with your primary care physician please Get Medicines reviewed and adjusted.  Please request your Prim.MD to go over all Hospital Tests and Procedure/Radiological results at the follow up, please get all Hospital records sent to your Prim MD by signing hospital release before you go home.  If you experience worsening of your admission symptoms, develop shortness of breath, life threatening emergency, suicidal or homicidal thoughts you must seek medical attention immediately by calling 911 or calling your MD immediately  if symptoms less severe.  You Must read complete instructions/literature along with all the possible adverse reactions/side effects for all the Medicines  you take and that have been prescribed to you. Take any new Medicines after you have completely understood and accpet all the possible adverse reactions/side effects.   Do not drive when taking Pain medications.  Do not take more than prescribed Pain, Sleep and Anxiety Medications  Wear Seat belts while driving.   Discharge wound care:   Complete by: As directed    Wound care daily.  Cleanse R trochanter/thigh wound with NS, apply Medihoney to wound bed daily, cover with dry gauze and secure with silicone foam     Cleanse wound to left plantar foot with NS and pat dry. Apply betadine  to lesion.  May leave open to air or cover with dry dressing if patient prefers.   Increase activity slowly   Complete by: As directed        Discharge Medications   Allergies as of 01/03/2024       Reactions   Bee Venom Anaphylaxis, Swelling, Other (See Comments)   Cold Sweats, also   Latex Rash, Dermatitis        Medication List     STOP taking these medications    amLODipine  10 MG tablet Commonly known as: NORVASC    losartan 50 MG tablet Commonly known as: COZAAR       TAKE these medications    Accu-Chek Guide test strip Generic drug: glucose blood USE AS INSTRUCTED TO CHECK BLOOD SUGAR 2 TIMES DAILY   carvedilol  3.125 MG tablet Commonly known as: COREG  Take 1 tablet (3.125 mg total) by mouth 2 (two) times daily with a meal. What changed:  medication strength how much to take   Dexcom G6 Sensor Misc Inject 1 Device into the skin See admin instructions. Place a new sensor into the skin every 10 days   Dexcom G6 Transmitter Misc SMARTSIG:Every 3 Months   Lantus  SoloStar 100 UNIT/ML Solostar Pen Generic drug: insulin  glargine Inject 10-15 Units into the skin See admin instructions. Inject 15 units into the skin in the morning and 10 units into the skin at bedtime   loperamide 2 MG capsule Commonly known as: IMODIUM Take 2 mg by mouth daily as needed for diarrhea or loose  stools.   oxyCODONE  5 MG immediate release tablet Commonly known as: Roxicodone  Take 1 tablet (5 mg total) by mouth every 6 (six) hours as needed for severe pain (pain score 7-10).   pantoprazole  40 MG tablet Commonly known as: Protonix  Take 1 tablet (40 mg total) by mouth 2 (two) times daily before a meal.   prochlorperazine  10 MG tablet Commonly known as: COMPAZINE  Take 1 tablet (10 mg total) by mouth every 6 (six) hours as needed for nausea or vomiting.               Discharge Care Instructions  (From admission, onward)           Start  Ordered   01/03/24 0000  Discharge wound care:       Comments: Wound care daily.  Cleanse R trochanter/thigh wound with NS, apply Medihoney to wound bed daily, cover with dry gauze and secure with silicone foam     Cleanse wound to left plantar foot with NS and pat dry. Apply betadine  to lesion.  May leave open to air or cover with dry dressing if patient prefers.   01/03/24 0915             Follow-up Information     Pura Lenis, MD. Schedule an appointment as soon as possible for a visit in 1 week(s).   Specialty: Family Medicine Why: Also follow-up with your gastroenterologist Dr. Iverson and oncologist Dr. Dino within a week of discharge. Contact information: [address] [place] [address] [phone]                 Major procedures and Radiology Reports - PLEASE review detailed and final reports thoroughly  -     CT ABDOMEN PELVIS WO CONTRAST Result Date: 01/01/2024 CLINICAL DATA:  Abdominal pain. Hyperbilirubinemia. History of obstructive jaundice in December 2024. EXAM: CT ABDOMEN AND PELVIS WITHOUT CONTRAST TECHNIQUE: Multidetector CT imaging of the abdomen and pelvis was performed following the standard protocol without IV contrast. RADIATION DOSE REDUCTION: This exam was performed according to the departmental dose-optimization program which includes automated exposure  control, adjustment of the mA and/or kV according to patient size and/or use of iterative reconstruction technique. COMPARISON:  10/13/2023 and older exams. MR with MRCP dated 05/15/2023. FINDINGS: Lower chest: Small, right greater than left, pleural effusions. There is associated dependent lower lobe opacity, right greater than left, consistent with atelectasis. Hepatobiliary: Liver normal in size and overall attenuation vague 1.4 cm mass bulges the anterior contour of segment 3, not appreciated on prior imaging. No other evidence of a liver mass or focal lesion. There is intra and extrahepatic bile duct dilation despite the presence of biliary stents. This is increased from the prior CT, common hepatic duct measuring 7 8 mm, previously 6 mm. Gallbladder is distended.  No gallstones or wall thickening. Pancreas: Oval mass arises from the anterior superior margin pancreatic body, slightly hyperattenuating to the remaining pancreas, measuring 2.3 x 2.1 x 2.5 cm. This was not evident on prior studies. No other evidence of a pancreatic mass. No inflammation or duct dilation. Spleen: Normal in size without focal abnormality. Adrenals/Urinary Tract: No adrenal mass. Kidneys normal in overall size and position. Bilateral perinephric stranding is similar to the prior CT. Oval hypoattenuating homogeneous mass, mid to lower pole the right kidney, 3.6 cm, stable consistent with a cyst. No follow-up indicated no other defined renal masses, no stones and no hydronephrosis. Ureters are normal in course and in caliber. Bladder is unremarkable. Stomach/Bowel: Stomach unremarkable. Small bowel and colon are normal caliber. No wall thickening. No inflammation. Stable changes from a left transverse colon anastomosis. Vascular/Lymphatic: There are enlarged shoddy gastrohepatic ligament lymph nodes and porta hepatis nodes, measuring up to 1.4 cm in short axis, somewhat better appreciated the current exam, but otherwise not convincingly  changed. There are also prominent, but subcentimeter periceliac nodes. No other evidence of lymphadenopathy. No significant vascular abnormality on this unenhanced study. Reproductive: Mild prostate enlargement measuring 5.3 x 3.9 x 4.0 cm, unchanged. Other: No ascites. Musculoskeletal: No fracture or acute finding. No osteoblastic or osteolytic lesions. IMPRESSION: 1. Interval increase intrahepatic bile duct dilation compared to the CT from  10/13/2023. 2. Apparent mass arising from the anterior superior margin of the pancreatic body, 2.5 cm. This could reflect either a primary pancreatic mass or adjacent enlarged lymph node. 3. Subtle hypoattenuating, 1.4 cm masslike lesion mildly bulges the anterior contour of the left liver lobe, segment 3, not evident prior exams. 4. Persistent shoddy lymphadenopathy centered along gastrohepatic ligament. 5. Combination of these consistent with worsening metastatic disease. 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 Findings are most suspicious for either a pancreatic primary carcinoma or cholangiocarcinoma. 6. Small, right greater than left, pleural effusions with associated dependent atelectasis, new since prior CT. Electronically Signed   By: Alm Parkins M.D.   On: 01/01/2024 08:25   VAS US  DUPLEX DIALYSIS ACCESS (AVF, AVG) Result Date: 12/25/2023 DIALYSIS ACCESS Patient Name:  Yordin Rhoda  Date of Exam:   12/25/2023 Medical Rec #: [id number]       Accession #:    [id number] Date of Birth: 04/10/1966       Patient Gender: M Patient Age:   68 years Exam Location:  [address] Procedure:      VAS US  DUPLEX DIALYSIS ACCESS (AVF, AVG) Referring Phys: DEBBY ROBERTSON --------------------------------------------------------------------------------  Reason for Exam: ESRD. Access Site: Right Upper Extremity. Access Type: Brachial A- Axillary v graft. Comparison Study: N/A Performing Technologist: Dena Pane   Examination Guidelines: A complete evaluation includes B-mode imaging, spectral Doppler, color Doppler, and power Doppler as needed of all accessible portions of each vessel. Unilateral testing is considered an integral part of a complete examination. Limited examinations for reoccurring indications may be performed as noted.  Findings:   +--------------------+----------+-----------------+---------+ AVG                 PSV (cm/s)Flow Vol (mL/min)Describe  +--------------------+----------+-----------------+---------+ Native artery inflow   244                               +--------------------+----------+-----------------+---------+ Arterial anastomosis   477                               +--------------------+----------+-----------------+---------+ Prox graft             1041                    narrowing +--------------------+----------+-----------------+---------+ Mid graft              170                               +--------------------+----------+-----------------+---------+ Distal graft           357                               +--------------------+----------+-----------------+---------+ Venous anastomosis     201                               +--------------------+----------+-----------------+---------+ Venous outflow         157                               +--------------------+----------+-----------------+---------+ Narrowing seen at the proximal graft  Summary: Patent right brachial artery to  axillary vein arteriovenous graft. Narrowing is visualized in the proximal graft with a velocity of 1041 cm/s.  *See table(s) above for measurements and observations.  Diagnosing physician: Debby Robertson Electronically signed by Debby Robertson on 12/25/2023 at 5:33:35 PM.    --------------------------------------------------------------------------------   Final    DG Chest Port 1 View Result Date: 12/06/2023 CLINICAL DATA:  [id number] S/P dialysis catheter insertion (HCC)  [id number] EXAM: PORTABLE CHEST - 1 VIEW COMPARISON:  None available. FINDINGS: Right IJ approach hemodialysis catheter in place terminating at the cavoatrial junction. Left chest port is similarly positioned terminating in the right atrium. No focal airspace consolidation, pleural effusion, or pneumothorax. Mild cardiomegaly. Tortuous aorta with aortic atherosclerosis. No acute fracture or destructive lesions. Multilevel thoracic osteophytosis. IMPRESSION: Right hemodialysis catheter, well-positioned, terminating at the cavoatrial junction. No pneumothorax. Electronically Signed   By: Rogelia Myers M.D.   On: 12/06/2023 14:21   HYBRID OR IMAGING ([facility] ONLY) Result Date: 12/06/2023 There is no interpretation for this exam.  This order is for images obtained during a surgical procedure.  Please See Surgeries Tab for more information regarding the procedure.    Micro Results    No results found for this or any previous visit (from the past 240 hours).  Today   Subjective    Jeffrey Young today has no headache,no chest abdominal pain,no new weakness tingling or numbness, feels much better wants to go home today.    Objective   Blood pressure 116/68, pulse 72, temperature (!) 97.5 F (36.4 C), temperature source Oral, resp. rate 15, height 6' 1 (1.854 m), weight 70.6 kg, SpO2 99%.   Intake/Output Summary (Last 24 hours) at 01/03/2024 0915 Last data filed at 01/03/2024 0900 Gross per 24 hour  Intake 690 ml  Output 1000 ml  Net -310 ml    Exam  Awake Alert, No new F.N deficits,    [place].AT,PERRAL Supple Neck,   Symmetrical Chest wall movement, Good air movement bilaterally, CTAB RRR,No Gallops,   +ve B.Sounds, Abd Soft, Non tender,  No edema    Data Review   Recent Labs  Lab 12/31/23 1806 12/31/23 1834 01/01/24 0251 01/01/24 0846 01/02/24 0449 01/03/24 0607  WBC 9.1  --  6.4  --  7.2 7.5  HGB 7.7* 8.5* 6.9* 6.9* 9.3* 8.7*  HCT 23.7* 25.0* 20.7* 20.8* 26.4* 26.0*  PLT 184   --  148*  --  174 159  MCV 92.9  --  91.6  --  88.0 90.6  MCH 30.2  --  30.5  --  31.0 30.3  MCHC 32.5  --  33.3  --  35.2 33.5  RDW 14.1  --  14.0  --  14.5 15.0  LYMPHSABS 1.3  --  1.0  --   --  0.9  MONOABS 0.8  --  0.7  --   --  0.9  EOSABS 0.1  --  0.1  --   --  0.2  BASOSABS 0.0  --  0.0  --   --  0.1    Recent Labs  Lab 12/31/23 1806 12/31/23 1834 01/01/24 0251 01/02/24 0449  NA 133* 134* 133* 134*  K 3.5 3.1* 3.3* 3.4*  CL 91* 90* 92* 95*  CO2 28  --  28 24  ANIONGAP 14  --  13 15  GLUCOSE 104* 99 102* 112*  BUN 10 10 15  27*  CREATININE 2.09* 1.90* 3.01* 4.38*  AST 23  --  23  --   ALT 27  --  25  --   ALKPHOS 399*  --  332*  --   BILITOT 6.0*  --  5.5*  --   ALBUMIN  2.1*  --  1.9*  --   INR 1.0  --  1.0  --   HGBA1C 5.4  --   --   --   BNP  --   --  328.5*  --   MG 1.7  --  1.7 1.7  PHOS  --   --  1.9*  --   CALCIUM  8.1*  --  7.9* 8.4*    Total Time in preparing paper work, data evaluation and todays exam - 35 minutes  Signature  -    Lavada Stank M.D on 01/03/2024 at 9:15 AM   -  To page go to [email or web address]

## 2024-01-03 NOTE — Progress Notes (Signed)
 [facility] Progress Note   Subjective:   Patient seen and examined in his room. They are planning on discharging him later today. He had HD yesterday and reported that he tolerated it well. They were able to yield 1L off during HD. I will reach out to his clinic and let them know about his d/c. BP stable today.  Objective Vitals:   01/02/24 1935 01/02/24 2357 01/03/24 0324 01/03/24 0801  BP: 136/66 (!) 148/77 134/69 116/68  Pulse: 65  74 72  Resp: 14  20 15   Temp: 98.3 F (36.8 C) 97.8 F (36.6 C) 98.6 F (37 C) (!) 97.5 F (36.4 C)  TempSrc: Oral Oral Oral Oral  SpO2: 99%  98% 99%  Weight:      Height:       GENERAL:  Alert, pleasant, no acute distress, resting in bed HEENT:  EOMI CARDIOVASCULAR:  RRR, no murmurs appreciated RESPIRATORY:  Clear to auscultation, no wheezing, rales, or rhonchi GASTROINTESTINAL:  Soft, nontender, nondistended EXTREMITIES:  No LE edema bilaterally NEURO:  No new focal deficits appreciated SKIN:  No rashes noted PSYCH:  Appropriate mood and affect Dialysis Access: [facility] with maturing AVG ready for use 01/17/24  Additional Objective Labs: Basic Metabolic Panel: Recent Labs  Lab 12/31/23 1806 12/31/23 1834 01/01/24 0251 01/02/24 0449  NA 133* 134* 133* 134*  K 3.5 3.1* 3.3* 3.4*  CL 91* 90* 92* 95*  CO2 28  --  28 24  GLUCOSE 104* 99 102* 112*  BUN 10 10 15  27*  CREATININE 2.09* 1.90* 3.01* 4.38*  CALCIUM  8.1*  --  7.9* 8.4*  PHOS  --   --  1.9*  --    Liver Function Tests: Recent Labs  Lab 12/31/23 1806 01/01/24 0251  AST 23 23  ALT 27 25  ALKPHOS 399* 332*  BILITOT 6.0* 5.5*  PROT 6.3* 5.3*  ALBUMIN  2.1* 1.9*   No results for input(s): LIPASE, AMYLASE in the last 168 hours. CBC: Recent Labs  Lab 12/31/23 1806 12/31/23 1834 01/01/24 0251 01/01/24 0846 01/02/24 0449 01/03/24 0607  WBC 9.1  --  6.4  --  7.2 7.5  NEUTROABS 6.8  --  4.5  --   --  5.3  HGB 7.7*   < > 6.9* 6.9* 9.3* 8.7*  HCT 23.7*    < > 20.7* 20.8* 26.4* 26.0*  MCV 92.9  --  91.6  --  88.0 90.6  PLT 184  --  148*  --  174 159   < > = values in this interval not displayed.    CBG: Recent Labs  Lab 01/02/24 0536 01/02/24 1210 01/02/24 2022 01/03/24 0004 01/03/24 0627  GLUCAP 106* 100* 84 131* 158*   Iron Studies:  Recent Labs    12/31/23 1806  IRON 108  TIBC 172*  FERRITIN 1,554*    Medications:    carvedilol   3.125 mg Oral BID WC   Chlorhexidine  Gluconate Cloth  6 each Topical Q0600   insulin  aspart  0-6 Units Subcutaneous Q6H   leptospermum manuka honey  1 Application Topical Daily   pantoprazole  (PROTONIX ) IV  40 mg Intravenous Q12H   Dialysis Orders: [facility] MWF EDW 74 kg - reported that he is losing weight and has poor appetite 4 hours 3K/2.5 Ca bath 400/600 No esa due to malignancy Hectorol 1 mcg   Assessment/Plan:  Upper GI bleed: at patient's HD unit, they noted a drop in his Hgb from 8.5 - 6.5. Patient reported he  had hematemesis prior to coming to the hospital. GI following. Given 2 U of PRBC. Planned ERCP with stent placement today with Dr. Rollin.   ESRD:  On HD at [facility] clinic. I called to get his HD orders. Using [facility] and has R AVG that is maturing and will be ready for use 01/17/24. Next HD today after his ERCP.  HFpEF: does not appear to be volume overloaded Hyperbilirubinemia: elevated on presentation but showing mild improvement. CT noted increase in intrahepatic vile duct dilation compared from CT 10/13/23. Appears to be second to pancreatic mass or lymphadenopathy. Planned stent placement today during ERCP.   Hypertension/volume: BP higher today. His home medications were restarted and PRN IV hydralazine  ordered. Reported that he has been losing weight from his home unit. He has had his EDW lowered multiple times. He was recently placed on Mirtazapine. Patient appears euvolemic on exam. We will reach out to his home clinic with updated EDW.   Anemia: Not on ESA due  to malignancy. He has received 2 U PRBC so far with this hospitalization. Last Hgb9.3. No venofer due to high ferritin level.  Metabolic bone disease: On hectorol 1 mcg. No calcitriol. Not on phos binder currently due to phos 1.9.    Nutrition:  Albumin  1.9. Starting protein supplements when he is cleared for diet.  DM: on SSI per primary. Patient is currently NPO. Dispo: planned d/c later today.   Belvie Och, NP 01/03/2024, 10:59 AM  [facility]

## 2024-01-03 NOTE — Progress Notes (Signed)
 IP PROGRESS NOTE  Subjective:   Jeffrey Young is well-known to me with a history of metastatic colon cancer, currently maintained off of systemic therapy.  He recently developed progressive renal failure and started hemodialysis at the [facility]  He reports nausea with vomiting of a black/maroon material beginning last week.  He was noted to have severe anemia at hemodialysis 12/31/2023 and was referred to the [facility] emergency room.  The admission CBC returned with a hemoglobin of 7.7.  He has been transfused 2 units of packed red blood cells during this admission and reports feeling better. He also has a history of nausea and reports developing jaundice over the past week.    Objective: Vital signs in last 24 hours: Blood pressure 134/69, pulse 74, temperature 98.6 F (37 C), temperature source Oral, resp. rate 20, height 6' 1 (1.854 m), weight 155 lb 10.3 oz (70.6 kg), SpO2 98%.  Intake/Output from previous day: 08/13 0701 - 08/14 0700 In: 450 [I.V.:250; IV Piggyback:200] Out: 1000   Physical Exam:  HEENT: Scleral icterus Lungs: Clear bilaterally Cardiac: Regular rate and rhythm Abdomen: No hepatosplenomegaly, nontender, smooth masslike fullness in the lateral right mid abdomen Extremities: No leg edema Skin: Left heel ulcer appears almost completely healed  Portacath/PICC-without erythema  Lab Results: Recent Labs    01/01/24 0251 01/01/24 0846 01/02/24 0449  WBC 6.4  --  7.2  HGB 6.9* 6.9* 9.3*  HCT 20.7* 20.8* 26.4*  PLT 148*  --  174    BMET Recent Labs    01/01/24 0251 01/02/24 0449  NA 133* 134*  K 3.3* 3.4*  CL 92* 95*  CO2 28 24  GLUCOSE 102* 112*  BUN 15 27*  CREATININE 3.01* 4.38*  CALCIUM  7.9* 8.4*    Lab Results  Component Value Date   CEA1 11.44 (H) 09/27/2020   CEA 103.41 (H) 11/20/2023    Studies/Results: No results found.  Medications: I have reviewed the patient's current medications.  Assessment/Plan:  Descending colon  cancer, stage IIIc (T3N2b M0), status post a partial left colectomy 07/30/2020, 9/16 lymph nodes positive, lymphovascular invasion, 1 satellite nodule, negative margins, MSS, no loss of mismatch repair protein expression; foundation 1 K-ras wild-type, NRAS Q61H, microsatellite stable, tumor mutation burden 4. -History of large polyp in the left side of the colon-referred to [facility] in 05/2018 for procedure canceled secondary to COVID-19 pandemic.  Procedure was not rescheduled. -CT chest/abdomen/pelvis with contrast 07/27/2020-3 small pulmonary nodules less than 5 mm favored to be benign, circumferential luminal narrowing of the distal transverse colon concerning for malignancy, no metastatic adenopathy in the mesentery porta hepatis, no for metastasis. -CEA on 07/27/2020 was 17.3; 37 on 08/30/2020; 33 on 09/13/2020 -Colonoscopy performed 07/27/2020 showed a fungating, infiltrative and ulcerated nonobstructing large mass in the proximal descending colon.  Biopsy-adenocarcinoma -Cycle 1 FOLFOX 08/30/2020 -Cycle 2 FOLFOX 09/13/2020, Emend  added for delayed nausea -Cycle 3 FOLFOX 09/27/2020 -Cycle 4 FOLFOX 10/11/2020 -Cycle 5 FOLFOX 11/08/2020 -Cycle 6 FOLFOX 11/23/2020 -CT 12/03/2020-prior 3 mm left apical nodule no longer seen, no new/suspicious pulmonary nodules, no evidence of metastatic disease -Cycle 7 FOLFOX 12/06/2020 -Cycle 8 FOLFOX 12/21/2020 -Cycle 9 FOLFOX 01/03/2021 -Cycle 10 FOLFOX 01/17/2021 -Cycle 11 FOLFOX 01/31/2021, oxaliplatin  held secondary to neuropathy symptoms -Cycle 12 FOLFOX 02/15/2021, oxaliplatin  held secondary to neuropathy -CT abdomen/pelvis 04/02/2021-no evidence of recurrent colon cancer -CT chest 04/08/2021-no evidence of metastatic disease -Elevated CEA February and March 2023 -08/17/2021 PET scan-no evidence of local recurrence or metastasis -09/26/2021-Guardant-ctDNA detected -Colonoscopy 10/04/2021-negative -CT 11/17/2021-no  evidence of metastatic disease -CTs 01/08/2022-no evidence of  metastatic disease -PET scan 08/10/2022-several newly enlarged lymph nodes with moderate metabolic activity -CT 11/06/2022-stable left supraclavicular and retroperitoneal nodes, mild progression of pelvic adenopathy, new 6 mm segment 2 liver lesion -CT 03/05/2023-increase size of left supraclavicular node and retroperitoneal nodes, increased size of gastropathic and left iliac side chain lymph nodes, stable subtle hypoattenuating lesions at segment 2 in the hepatic dome -CT abdomen/pelvis 05/07/2023-new intrahepatic and extrahepatic biliary duct dilatation with possible associated periportal edema.  Common duct dilated up to 15 mm diameter, abruptly terminates prior to entering the head of the pancreas.  Distended gallbladder with possible gallbladder wall thickening and pericholecystic edema.  No substantial change in lymphadenopathy of the gastrohepatic ligament, hepatoduodenal ligament and left external iliac chain. 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  Moderate diffuse intrahepatic and proximal extrahepatic biliary duct dilatation.  Mild gallbladder distention with moderate diffuse gallbladder wall thickening.  Minimal pericholecystic fluid and fat stranding.  Solitary 1.1 cm anterior segment 2 left liver mass.  Multiple mildly enlarged porta hepatis and portacaval nodes. -Biopsy perihepatic lymph node 05/10/2023-malignant cells, adenocarcinoma -CTs 07/02/2023-persistent biliary duct dilatation with indwelling stent.  Persistent wall thickening of the gallbladder.  Subtle areas of nodal enlargement identified in the abdomen and pelvis, similar to recent prior examinations.  Enlarged node in the supraclavicular region appears smaller.  No new thoracic nodes.  Mild areas of groundglass in the upper lobes of the lungs bilaterally, nonspecific. - CTs 10/13/2023: Interval growth of a  periportal node, nonpathologically enlarged left supraclavicular retroperitoneal nodes, persistent mild intra [place]ock biliary duct dilatation with common bile duct stents in place -CT abdomen/pelvis 01/01/2024: Vague 1.4 cm mass bulging the anterior contour of segment 3, intra P[place]ock and exophytic bile duct dilation-increased, distended gallbladder, mass anterior to the superior margin of the pancreas body, enlarged gastropathic ligament and porta hepatis nodes, small pleural effusions 2.  Anemia due to GI bleeding, iron deficiency?,  Renal insufficiency? 3.  New onset acute diastolic CHF March 2022 4.  Diabetes mellitus 5.  Renal failure-dialysis catheter placed 11/19/2023.  Dialysis scheduled to begin 11/21/2023 Monday Wednesday Friday schedule. 6.  Hypertension 7.  History of left transmetatarsal amputation 8.  History of colon polyps 9.  Neuropathy 10.  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  Admission with obstructive jaundice 05/07/2023 - 05/22/2023.  Upper EUS 05/10/2023-lymph node visualized and measured in the porta hepatis region; FNA showed malignant cells, adenocarcinoma.  ERCP 05/10/2023-biliary tract obstruction secondary to a mass in the common bile duct, plastic stent placed into the common bile duct.  ERCP 05/18/2023-visibly patent stent from the common bile duct was seen in the major papilla.  Stent removed from the common bile duct.  2 plastic stents placed into the common bile duct. 12/31/2023 nausea/jaundice: ERCP 01/02/2024-occlusion of plastic bile duct stents-removed, an uncovered metal stent was placed, a single stricture was found in the common bile duct, clot coming from the bile duct 13.  Admission 12/31/2023 with hematemesis and severe anemia, EGD 01/02/2024: No evidence of a bleeding source in the upper GI tract    Jeffrey Young has metastatic colon cancer.  He was admitted  with nausea and jaundice secondary to occluded plastic bile duct stents.  The stents were removed and a metal stent was placed.  He  reported hematemesis and was found to have severe anemia on hospital admission.  No bleeding source was found in the upper GI tract.  The hematemesis may have been related to bleeding from the bile duct.  The CT findings in the upper abdomen likely reflect progressive metastatic adenopathy as opposed to pancreas cancer or cholangiocarcinoma.  I reviewed the CT images.  We plan to discuss FOLFIRI chemotherapy when he returns for an office visit next week.  We did not initiate treatment over the past few months due to progressive renal failure and the start of hemodialysis.  Recommendations: Outpatient follow-up at the Cancer center as scheduled 01/10/2024 We will check a chemistry panel 01/10/2024 to follow-up on the elevated bilirubin post placement of a new stent Outpatient follow-up of the hemoglobin 01/10/2024 Please call oncology as needed  LOS: 2 days   Arley Hof, MD   01/03/2024, 7:17 AM

## 2024-01-03 NOTE — Progress Notes (Signed)
 DC paperwork, med list, follow up reviewed with pt who verbalized understanding.  Hard copy RX placed in pt's discharge folder.  Pt prefers to fill RX at the [facility].  IV and tele removed.  Pt assisted with getting dressed by this RN.  Pt's wife is enroute.  Pt to go to DC lounge via wheelchair

## 2024-01-03 NOTE — Evaluation (Signed)
 Occupational Therapy Evaluation Patient Details Name: Jeffrey Young MRN: [id number] DOB: 08-25-1965 Today's Date: 01/03/2024   History of Present Illness   58 year old male presenting with acute upper GI bleed and worsening anemia. Underwent ERCP on 01/02/24.Past history of ESRD (MWF), colon cancer s/p partial colectomy, HFpEF, DM, transmetatarsal amputation left foot, anemia (baseline 7-9).     Clinical Impressions Pt reports feeling much better than upon admission. Reports no appetite. Pt walks independently at baseline and reports his wife helps him with everything at home. Pt presents with generalized weakness. Benefits from use of RW and supervision for ambulation. He needs set up to min assist for basic ADLs. Pt has all necessary DME at home. D/C plan is for home today.      If plan is discharge home, recommend the following:   A little help with walking and/or transfers;A little help with bathing/dressing/bathroom;Assistance with cooking/housework;Assist for transportation;Help with stairs or ramp for entrance     Functional Status Assessment   Patient has had a recent decline in their functional status and demonstrates the ability to make significant improvements in function in a reasonable and predictable amount of time.     Equipment Recommendations   None recommended by OT     Recommendations for Other Services         Precautions/Restrictions   Precautions Precautions: Fall Recall of Precautions/Restrictions: Intact Restrictions Weight Bearing Restrictions Per Provider Order: No     Mobility Bed Mobility Overal bed mobility: Modified Independent                  Transfers Overall transfer level: Needs assistance Equipment used: Rolling walker (2 wheels) Transfers: Sit to/from Stand Sit to Stand: Supervision           General transfer comment: supervision for safety from bed and chair      Balance Overall balance assessment:  Needs assistance   Sitting balance-Leahy Scale: Good     Standing balance support: Bilateral upper extremity supported, No upper extremity supported, During functional activity Standing balance-Leahy Scale: Poor Standing balance comment: Reliant on RW, can release walker at sink for one grooming activity                           ADL either performed or assessed with clinical judgement   ADL Overall ADL's : Needs assistance/impaired Eating/Feeding: Independent;Bed level   Grooming: Wash/dry hands;Supervision/safety;Standing   Upper Body Bathing: Minimal assistance;Sitting   Lower Body Bathing: Minimal assistance;Sit to/from stand   Upper Body Dressing : Set up;Sitting   Lower Body Dressing: Minimal assistance;Sit to/from stand   Toilet Transfer: Supervision/safety;Ambulation;Rolling walker (2 wheels)           Functional mobility during ADLs: Supervision/safety;Rolling walker (2 wheels)       Vision Ability to See in Adequate Light: 0 Adequate Patient Visual Report: No change from baseline       Perception         Praxis         Pertinent Vitals/Pain Pain Assessment Pain Assessment: No/denies pain     Extremity/Trunk Assessment Upper Extremity Assessment Upper Extremity Assessment: Overall WFL for tasks assessed   Lower Extremity Assessment Lower Extremity Assessment: Defer to PT evaluation   Cervical / Trunk Assessment Cervical / Trunk Assessment: Normal   Communication Communication Communication: No apparent difficulties   Cognition Arousal: Alert Behavior During Therapy: WFL for tasks assessed/performed, Flat affect Cognition: No apparent impairments  Following commands: Intact       Cueing  General Comments   Cueing Techniques: Verbal cues      Exercises     Shoulder Instructions      Home Living Family/patient expects to be discharged to:: Private residence Living  Arrangements: Spouse/significant other Available Help at Discharge: Family;Available PRN/intermittently (wife works) Type of Home: House Home Access: Stairs to enter Secretary/administrator of Steps: 3 Entrance Stairs-Rails: Can reach both Home Layout: One level     Bathroom Shower/Tub: Producer, television/film/video: Handicapped height     Home Equipment: Pharmacist, hospital (2 wheels);BSC/3in1          Prior Functioning/Environment Prior Level of Function : Needs assist             Mobility Comments: Ind ADLs Comments: Wife helps with showering and dressing and all IADLs as needed.    OT Problem List:     OT Treatment/Interventions:        OT Goals(Current goals can be found in the care plan section)       OT Frequency:       Co-evaluation              AM-PAC OT 6 Clicks Daily Activity     Outcome Measure Help from another person eating meals?: None Help from another person taking care of personal grooming?: A Little Help from another person toileting, which includes using toliet, bedpan, or urinal?: A Little Help from another person bathing (including washing, rinsing, drying)?: A Little Help from another person to put on and taking off regular upper body clothing?: None Help from another person to put on and taking off regular lower body clothing?: A Little 6 Click Score: 20   End of Session Equipment Utilized During Treatment: Rolling walker (2 wheels);Gait belt  Activity Tolerance: Patient tolerated treatment well Patient left: in bed;with call bell/phone within reach  OT Visit Diagnosis: Unsteadiness on feet (R26.81);Muscle weakness (generalized) (M62.81)                Time: 8940-8888 OT Time Calculation (min): 12 min Charges:  OT General Charges $OT Visit: 1 Visit OT Evaluation $OT Eval Low Complexity: 1 Low  Mliss HERO, OTR/L Acute Rehabilitation Services Office: [phone]   Kennth Mliss Helling 01/03/2024, 11:19 AM

## 2024-01-03 NOTE — Progress Notes (Signed)
 AVS completed for discharge packet and placed with chart.

## 2024-01-03 NOTE — Discharge Instructions (Addendum)
 Follow with Primary MD Pura Lenis, MD also follow-up with your gastroenterologist and oncologist in 7 days   Get CBC, CMP,   -  checked next visit with your primary MD    Activity: As tolerated with Full fall precautions use walker/cane & assistance as needed  Disposition Home    Diet: Renal low carbohydrate diet with 1.5 L fluid restriction per day.  Check CBGs q. ACHS  Special Instructions: If you have smoked or chewed Tobacco  in the last 2 yrs please stop smoking, stop any regular Alcohol  and or any Recreational drug use.  On your next visit with your primary care physician please Get Medicines reviewed and adjusted.  Please request your Prim.MD to go over all Hospital Tests and Procedure/Radiological results at the follow up, please get all Hospital records sent to your Prim MD by signing hospital release before you go home.  If you experience worsening of your admission symptoms, develop shortness of breath, life threatening emergency, suicidal or homicidal thoughts you must seek medical attention immediately by calling 911 or calling your MD immediately  if symptoms less severe.  You Must read complete instructions/literature along with all the possible adverse reactions/side effects for all the Medicines you take and that have been prescribed to you. Take any new Medicines after you have completely understood and accpet all the possible adverse reactions/side effects.   Do not drive when taking Pain medications.  Do not take more than prescribed Pain, Sleep and Anxiety Medications  Wear Seat belts while driving.

## 2024-01-04 ENCOUNTER — Ambulatory Visit (HOSPITAL_COMMUNITY): Admission: RE | Admit: 2024-01-04 | Source: Home / Self Care | Admitting: Gastroenterology

## 2024-01-04 ENCOUNTER — Encounter (HOSPITAL_COMMUNITY): Admission: RE | Payer: Self-pay | Source: Home / Self Care

## 2024-01-04 LAB — CEA: CEA: 140 ng/mL — ABNORMAL HIGH (ref 0.0–4.7)

## 2024-01-04 LAB — CANCER ANTIGEN 19-9: CA 19-9: 1452 U/mL — ABNORMAL HIGH (ref 0–35)

## 2024-01-04 SURGERY — ERCP, WITH INTERVENTION IF INDICATED
Anesthesia: Monitor Anesthesia Care

## 2024-01-10 ENCOUNTER — Other Ambulatory Visit: Payer: Self-pay | Admitting: *Deleted

## 2024-01-10 ENCOUNTER — Inpatient Hospital Stay

## 2024-01-10 ENCOUNTER — Inpatient Hospital Stay (HOSPITAL_BASED_OUTPATIENT_CLINIC_OR_DEPARTMENT_OTHER): Admitting: Oncology

## 2024-01-10 ENCOUNTER — Other Ambulatory Visit: Attending: Oncology

## 2024-01-10 VITALS — BP 105/63 | HR 71 | Temp 98.7°F | Resp 18 | Ht 73.0 in | Wt 163.0 lb

## 2024-01-10 DIAGNOSIS — D649 Anemia, unspecified: Secondary | ICD-10-CM | POA: Insufficient documentation

## 2024-01-10 DIAGNOSIS — Z992 Dependence on renal dialysis: Secondary | ICD-10-CM | POA: Insufficient documentation

## 2024-01-10 DIAGNOSIS — E119 Type 2 diabetes mellitus without complications: Secondary | ICD-10-CM

## 2024-01-10 DIAGNOSIS — N186 End stage renal disease: Secondary | ICD-10-CM | POA: Diagnosis not present

## 2024-01-10 DIAGNOSIS — I5032 Chronic diastolic (congestive) heart failure: Secondary | ICD-10-CM | POA: Diagnosis not present

## 2024-01-10 DIAGNOSIS — C185 Malignant neoplasm of splenic flexure: Secondary | ICD-10-CM

## 2024-01-10 DIAGNOSIS — C186 Malignant neoplasm of descending colon: Secondary | ICD-10-CM | POA: Diagnosis present

## 2024-01-10 DIAGNOSIS — C189 Malignant neoplasm of colon, unspecified: Secondary | ICD-10-CM

## 2024-01-10 DIAGNOSIS — I110 Hypertensive heart disease with heart failure: Secondary | ICD-10-CM | POA: Diagnosis not present

## 2024-01-10 LAB — CMP (CANCER CENTER ONLY)
ALT: 42 U/L (ref 0–44)
AST: 44 U/L — ABNORMAL HIGH (ref 15–41)
Albumin: 2.8 g/dL — ABNORMAL LOW (ref 3.5–5.0)
Alkaline Phosphatase: 453 U/L — ABNORMAL HIGH (ref 38–126)
Anion gap: 12 (ref 5–15)
BUN: 11 mg/dL (ref 6–20)
CO2: 28 mmol/L (ref 22–32)
Calcium: 8.6 mg/dL — ABNORMAL LOW (ref 8.9–10.3)
Chloride: 91 mmol/L — ABNORMAL LOW (ref 98–111)
Creatinine: 3.13 mg/dL — ABNORMAL HIGH (ref 0.61–1.24)
GFR, Estimated: 22 mL/min — ABNORMAL LOW (ref >60)
Glucose, Bld: 179 mg/dL — ABNORMAL HIGH (ref 70–99)
Potassium: 3.4 mmol/L — ABNORMAL LOW (ref 3.5–5.1)
Sodium: 131 mmol/L — ABNORMAL LOW (ref 135–145)
Total Bilirubin: 2.4 mg/dL — ABNORMAL HIGH (ref 0.0–1.2)
Total Protein: 6.7 g/dL (ref 6.5–8.1)

## 2024-01-10 LAB — CBC WITH DIFFERENTIAL (CANCER CENTER ONLY)
Abs Immature Granulocytes: 0.06 K/uL (ref 0.00–0.07)
Basophils Absolute: 0.1 K/uL (ref 0.0–0.1)
Basophils Relative: 0 %
Eosinophils Absolute: 0.1 K/uL (ref 0.0–0.5)
Eosinophils Relative: 1 %
HCT: 23.5 % — ABNORMAL LOW (ref 39.0–52.0)
Hemoglobin: 8 g/dL — ABNORMAL LOW (ref 13.0–17.0)
Immature Granulocytes: 1 %
Lymphocytes Relative: 9 %
Lymphs Abs: 1.1 K/uL (ref 0.7–4.0)
MCH: 31.5 pg (ref 26.0–34.0)
MCHC: 34 g/dL (ref 30.0–36.0)
MCV: 92.5 fL (ref 80.0–100.0)
Monocytes Absolute: 1.2 K/uL — ABNORMAL HIGH (ref 0.1–1.0)
Monocytes Relative: 10 %
Neutro Abs: 9.3 K/uL — ABNORMAL HIGH (ref 1.7–7.7)
Neutrophils Relative %: 79 %
Platelet Count: 274 K/uL (ref 150–400)
RBC: 2.54 MIL/uL — ABNORMAL LOW (ref 4.22–5.81)
RDW: 15.1 % (ref 11.5–15.5)
WBC Count: 11.8 K/uL — ABNORMAL HIGH (ref 4.0–10.5)
nRBC: 0 % (ref 0.0–0.2)

## 2024-01-10 LAB — SAMPLE TO BLOOD BANK

## 2024-01-10 LAB — CEA (ACCESS): CEA (CHCC): 111.26 ng/mL — ABNORMAL HIGH (ref 0.00–5.00)

## 2024-01-10 LAB — PREPARE RBC (CROSSMATCH)

## 2024-01-10 MED ORDER — SODIUM CHLORIDE 0.9% FLUSH: 10.0000 mL | INTRAVENOUS | Status: DC | PRN

## 2024-01-10 MED ORDER — SODIUM CHLORIDE 0.9% IV SOLUTION: 250.0000 mL | INTRAVENOUS | Status: DC

## 2024-01-10 NOTE — Progress Notes (Signed)
 [facility] OFFICE PROGRESS NOTE   Diagnosis: Colon cancer  INTERVAL HISTORY:   Jeffrey Young returns as scheduled.  He was discharged in the hospital 01/03/2024 after admission with biliary obstruction.  He reported hematemesis prior to hospital admission.  No bleeding source was found on upper endoscopy.  Jaundice has improved. Jeffrey Young is here with his wife.  He continues hemodialysis on a Monday, Wednesday, and Friday schedule.  He reports feeling weak .  No pain.  He stays in bed over half of the day.  No fever.  Objective:  Vital signs in last 24 hours:  Blood pressure 105/63, pulse 71, temperature 98.7 F (37.1 C), temperature source Temporal, resp. rate 18, height 6' 1 (1.854 m), weight 163 lb (73.9 kg), SpO2 100%.    HEENT: Mild scleral icterus, no thrush Resp: Lungs clear bilaterally Cardio: Regular rate and rhythm GI: No hepatosplenomegaly, nontender Vascular: No leg edema     Portacath and dialysis catheter sites without erythema  Lab Results:  Lab Results  Component Value Date   WBC 11.8 (H) 01/10/2024   HGB 8.0 (L) 01/10/2024   HCT 23.5 (L) 01/10/2024   MCV 92.5 01/10/2024   PLT 274 01/10/2024   NEUTROABS 9.3 (H) 01/10/2024    CMP  Lab Results  Component Value Date   NA 131 (L) 01/10/2024   K 3.4 (L) 01/10/2024   CL 91 (L) 01/10/2024   CO2 28 01/10/2024   GLUCOSE 179 (H) 01/10/2024   BUN 11 01/10/2024   CREATININE 3.13 (H) 01/10/2024   CALCIUM  8.6 (L) 01/10/2024   PROT 6.7 01/10/2024   ALBUMIN  2.8 (L) 01/10/2024   AST 44 (H) 01/10/2024   ALT 42 01/10/2024   ALKPHOS 453 (H) 01/10/2024   BILITOT 2.4 (H) 01/10/2024   GFRNONAA 22 (L) 01/10/2024   GFRAA >60 01/26/2018    Lab Results  Component Value Date   CEA1 140.0 (H) 01/02/2024   CEA 111.26 (H) 01/10/2024   CAN199 1,452 (H) 01/02/2024     Medications: I have reviewed the patient's current medications.   Assessment/Plan:  Descending colon cancer, stage IIIc  (T3N2b M0), status post a partial left colectomy 07/30/2020, 9/16 lymph nodes positive, lymphovascular invasion, 1 satellite nodule, negative margins, MSS, no loss of mismatch repair protein expression; foundation 1 K-ras wild-type, NRAS Q61H, microsatellite stable, tumor mutation burden 4. -History of large polyp in the left side of the colon-referred to [facility] in 05/2018 for procedure canceled secondary to COVID-19 pandemic.  Procedure was not rescheduled. -CT chest/abdomen/pelvis with contrast 07/27/2020-3 small pulmonary nodules less than 5 mm favored to be benign, circumferential luminal narrowing of the distal transverse colon concerning for malignancy, no metastatic adenopathy in the mesentery porta hepatis, no for metastasis. -CEA on 07/27/2020 was 17.3; 37 on 08/30/2020; 33 on 09/13/2020 -Colonoscopy performed 07/27/2020 showed a fungating, infiltrative and ulcerated nonobstructing large mass in the proximal descending colon.  Biopsy-adenocarcinoma -Cycle 1 FOLFOX 08/30/2020 -Cycle 2 FOLFOX 09/13/2020, Emend  added for delayed nausea -Cycle 3 FOLFOX 09/27/2020 -Cycle 4 FOLFOX 10/11/2020 -Cycle 5 FOLFOX 11/08/2020 -Cycle 6 FOLFOX 11/23/2020 -CT 12/03/2020-prior 3 mm left apical nodule no longer seen, no new/suspicious pulmonary nodules, no evidence of metastatic disease -Cycle 7 FOLFOX 12/06/2020 -Cycle 8 FOLFOX 12/21/2020 -Cycle 9 FOLFOX 01/03/2021 -Cycle 10 FOLFOX 01/17/2021 -Cycle 11 FOLFOX 01/31/2021, oxaliplatin  held secondary to neuropathy symptoms -Cycle 12 FOLFOX 02/15/2021, oxaliplatin  held secondary to neuropathy -CT abdomen/pelvis 04/02/2021-no evidence of recurrent colon cancer -CT chest 04/08/2021-no evidence of metastatic disease -Elevated  CEA February and March 2023 -08/17/2021 PET scan-no evidence of local recurrence or metastasis -09/26/2021-Guardant-ctDNA detected -Colonoscopy 10/04/2021-negative -CT 11/17/2021-no evidence of metastatic disease -CTs 01/08/2022-no evidence of metastatic disease -PET  scan 08/10/2022-several newly enlarged lymph nodes with moderate metabolic activity -CT 11/06/2022-stable left supraclavicular and retroperitoneal nodes, mild progression of pelvic adenopathy, new 6 mm segment 2 liver lesion -CT 03/05/2023-increase size of left supraclavicular node and retroperitoneal nodes, increased size of gastropathic and left iliac side chain lymph nodes, stable subtle hypoattenuating lesions at segment 2 in the hepatic dome -CT abdomen/pelvis 05/07/2023-new intrahepatic and extrahepatic biliary duct dilatation with possible associated periportal edema.  Common duct dilated up to 15 mm diameter, abruptly terminates prior to entering the head of the pancreas.  Distended gallbladder with possible gallbladder wall thickening and pericholecystic edema.  No substantial change in lymphadenopathy of the gastrohepatic ligament, hepatoduodenal ligament and left external iliac chain. 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  Moderate diffuse intrahepatic and proximal extrahepatic biliary duct dilatation.  Mild gallbladder distention with moderate diffuse gallbladder wall thickening.  Minimal pericholecystic fluid and fat stranding.  Solitary 1.1 cm anterior segment 2 left liver mass.  Multiple mildly enlarged porta hepatis and portacaval nodes. -Biopsy perihepatic lymph node 05/10/2023-malignant cells, adenocarcinoma -CTs 07/02/2023-persistent biliary duct dilatation with indwelling stent.  Persistent wall thickening of the gallbladder.  Subtle areas of nodal enlargement identified in the abdomen and pelvis, similar to recent prior examinations.  Enlarged node in the supraclavicular region appears smaller.  No new thoracic nodes.  Mild areas of groundglass in the upper lobes of the lungs bilaterally, nonspecific. - CTs 10/13/2023: Interval growth of a periportal node,  nonpathologically enlarged left supraclavicular retroperitoneal nodes, persistent mild intra Paddock biliary duct dilatation with common bile duct stents in place -CT abdomen/pelvis 01/01/2024: Vague 1.4 cm mass bulging the anterior contour of segment 3, intra P[place]ock and exophytic bile duct dilation-increased, distended gallbladder, mass anterior to the superior margin of the pancreas body, enlarged gastropathic ligament and porta hepatis nodes, small pleural effusions 2.  Anemia due to GI bleeding, iron deficiency?,  Renal insufficiency? 3.  New onset acute diastolic CHF March 2022 4.  Diabetes mellitus 5.  Renal failure-dialysis catheter placed 11/19/2023.  Dialysis scheduled to begin 11/21/2023 Monday Wednesday Friday schedule. 6.  Hypertension 7.  History of left transmetatarsal amputation 8.  History of colon polyps 9.  Neuropathy 10.  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  Admission with obstructive jaundice 05/07/2023 - 05/22/2023.  Upper EUS 05/10/2023-lymph node visualized and measured in the porta hepatis region; FNA showed malignant cells, adenocarcinoma.  ERCP 05/10/2023-biliary tract obstruction secondary to a mass in the common bile duct, plastic stent placed into the common bile duct.  ERCP 05/18/2023-visibly patent stent from the common bile duct was seen in the major papilla.  Stent removed from the common bile duct.  2 plastic stents placed into the common bile duct. 12/31/2023 nausea/jaundice: ERCP 01/02/2024-occlusion of plastic bile duct stents-removed, an uncovered metal stent was placed, a single stricture was found in the common bile duct, clot coming from the bile duct 13.  Admission 12/31/2023 with hematemesis and severe anemia, EGD 01/02/2024: No evidence of a bleeding source in the upper GI tract    Disposition: Jeffrey Young has metastatic colon cancer.  He was admitted  12/31/2023 with jaundice  secondary to occlusion of biliary stents.  The stents were removed and a metal stent was placed.  Jaundice has improved, though the bilirubin remains mildly elevated.  Mr Young continues hemodialysis for end-stage renal failure.  He has a poor performance status secondary to metastatic colon cancer and multiple comorbid conditions.  He has diabetes and a foot ulcer.  The foot ulcer appeared almost completely healed when I saw him in the hospital last week.  His wife, Yareth Macdonnell serves as his caretaker.  Mr. Fedie has persistent severe anemia, likely secondary to renal failure and chronic disease.  We have not administered erythropoietin due to the metastatic colon cancer diagnosis.  He will receive a Red cell transfusion today.  He will return for an office visit next week for further discussion regarding chemotherapy versus hospice.  We discussed CODE STATUS.  He would like to remain on a full CODE STATUS.  Arley Hof, MD  01/10/2024  9:45 AM

## 2024-01-10 NOTE — Patient Instructions (Signed)

## 2024-01-10 NOTE — Patient Instructions (Signed)

## 2024-01-11 ENCOUNTER — Other Ambulatory Visit: Payer: Self-pay

## 2024-01-11 LAB — TYPE AND SCREEN
ABO/RH(D): A POS
Antibody Screen: NEGATIVE
Unit division: 0

## 2024-01-11 LAB — BPAM RBC
Blood Product Expiration Date: 202509162359
ISSUE DATE / TIME: 202508211025
Unit Type and Rh: 6200

## 2024-01-12 ENCOUNTER — Inpatient Hospital Stay

## 2024-01-14 ENCOUNTER — Inpatient Hospital Stay (HOSPITAL_COMMUNITY)
Admission: EM | Admit: 2024-01-14 | Discharge: 2024-01-21 | DRG: 374 | Disposition: A | Discharge status: Home Health | Attending: Internal Medicine | Admitting: Internal Medicine

## 2024-01-14 ENCOUNTER — Encounter (HOSPITAL_COMMUNITY): Payer: Self-pay

## 2024-01-14 ENCOUNTER — Other Ambulatory Visit: Payer: Self-pay

## 2024-01-14 DIAGNOSIS — E871 Hypo-osmolality and hyponatremia: Secondary | ICD-10-CM | POA: Diagnosis present

## 2024-01-14 DIAGNOSIS — C779 Secondary and unspecified malignant neoplasm of lymph node, unspecified: Secondary | ICD-10-CM | POA: Diagnosis present

## 2024-01-14 DIAGNOSIS — E1122 Type 2 diabetes mellitus with diabetic chronic kidney disease: Secondary | ICD-10-CM | POA: Diagnosis present

## 2024-01-14 DIAGNOSIS — Z8249 Family history of ischemic heart disease and other diseases of the circulatory system: Secondary | ICD-10-CM

## 2024-01-14 DIAGNOSIS — I152 Hypertension secondary to endocrine disorders: Secondary | ICD-10-CM | POA: Diagnosis present

## 2024-01-14 DIAGNOSIS — I5042 Chronic combined systolic (congestive) and diastolic (congestive) heart failure: Secondary | ICD-10-CM | POA: Diagnosis present

## 2024-01-14 DIAGNOSIS — Z992 Dependence on renal dialysis: Secondary | ICD-10-CM

## 2024-01-14 DIAGNOSIS — E785 Hyperlipidemia, unspecified: Secondary | ICD-10-CM | POA: Diagnosis present

## 2024-01-14 DIAGNOSIS — Z860100 Personal history of colon polyps, unspecified: Secondary | ICD-10-CM

## 2024-01-14 DIAGNOSIS — Z8616 Personal history of COVID-19: Secondary | ICD-10-CM

## 2024-01-14 DIAGNOSIS — K449 Diaphragmatic hernia without obstruction or gangrene: Secondary | ICD-10-CM | POA: Diagnosis present

## 2024-01-14 DIAGNOSIS — Z794 Long term (current) use of insulin: Secondary | ICD-10-CM

## 2024-01-14 DIAGNOSIS — T827XXA Infection and inflammatory reaction due to other cardiac and vascular devices, implants and grafts, initial encounter: Secondary | ICD-10-CM

## 2024-01-14 DIAGNOSIS — Z515 Encounter for palliative care: Secondary | ICD-10-CM

## 2024-01-14 DIAGNOSIS — D649 Anemia, unspecified: Secondary | ICD-10-CM | POA: Diagnosis not present

## 2024-01-14 DIAGNOSIS — Z79899 Other long term (current) drug therapy: Secondary | ICD-10-CM

## 2024-01-14 DIAGNOSIS — E876 Hypokalemia: Secondary | ICD-10-CM | POA: Diagnosis present

## 2024-01-14 DIAGNOSIS — T80211A Bloodstream infection due to central venous catheter, initial encounter: Secondary | ICD-10-CM

## 2024-01-14 DIAGNOSIS — Z91040 Latex allergy status: Secondary | ICD-10-CM

## 2024-01-14 DIAGNOSIS — C7889 Secondary malignant neoplasm of other digestive organs: Principal | ICD-10-CM | POA: Diagnosis present

## 2024-01-14 DIAGNOSIS — K2100 Gastro-esophageal reflux disease with esophagitis, without bleeding: Secondary | ICD-10-CM | POA: Diagnosis present

## 2024-01-14 DIAGNOSIS — C799 Secondary malignant neoplasm of unspecified site: Secondary | ICD-10-CM

## 2024-01-14 DIAGNOSIS — Z9049 Acquired absence of other specified parts of digestive tract: Secondary | ICD-10-CM

## 2024-01-14 DIAGNOSIS — R7881 Bacteremia: Secondary | ICD-10-CM

## 2024-01-14 DIAGNOSIS — E11319 Type 2 diabetes mellitus with unspecified diabetic retinopathy without macular edema: Secondary | ICD-10-CM | POA: Diagnosis present

## 2024-01-14 DIAGNOSIS — N529 Male erectile dysfunction, unspecified: Secondary | ICD-10-CM | POA: Diagnosis present

## 2024-01-14 DIAGNOSIS — K922 Gastrointestinal hemorrhage, unspecified: Principal | ICD-10-CM

## 2024-01-14 DIAGNOSIS — Z89432 Acquired absence of left foot: Secondary | ICD-10-CM

## 2024-01-14 DIAGNOSIS — Z91030 Bee allergy status: Secondary | ICD-10-CM

## 2024-01-14 DIAGNOSIS — N186 End stage renal disease: Secondary | ICD-10-CM | POA: Diagnosis present

## 2024-01-14 DIAGNOSIS — E1141 Type 2 diabetes mellitus with diabetic mononeuropathy: Secondary | ICD-10-CM | POA: Diagnosis present

## 2024-01-14 DIAGNOSIS — K920 Hematemesis: Secondary | ICD-10-CM

## 2024-01-14 DIAGNOSIS — I132 Hypertensive heart and chronic kidney disease with heart failure and with stage 5 chronic kidney disease, or end stage renal disease: Secondary | ICD-10-CM | POA: Diagnosis present

## 2024-01-14 DIAGNOSIS — C186 Malignant neoplasm of descending colon: Secondary | ICD-10-CM | POA: Diagnosis present

## 2024-01-14 DIAGNOSIS — E119 Type 2 diabetes mellitus without complications: Secondary | ICD-10-CM | POA: Diagnosis not present

## 2024-01-14 DIAGNOSIS — K254 Chronic or unspecified gastric ulcer with hemorrhage: Principal | ICD-10-CM | POA: Diagnosis present

## 2024-01-14 DIAGNOSIS — N2581 Secondary hyperparathyroidism of renal origin: Secondary | ICD-10-CM | POA: Diagnosis present

## 2024-01-14 DIAGNOSIS — D62 Acute posthemorrhagic anemia: Secondary | ICD-10-CM | POA: Diagnosis not present

## 2024-01-14 DIAGNOSIS — C189 Malignant neoplasm of colon, unspecified: Secondary | ICD-10-CM | POA: Diagnosis present

## 2024-01-14 DIAGNOSIS — E1159 Type 2 diabetes mellitus with other circulatory complications: Secondary | ICD-10-CM | POA: Diagnosis present

## 2024-01-14 LAB — COMPREHENSIVE METABOLIC PANEL WITH GFR
ALT: 33 U/L (ref 0–44)
AST: 39 U/L (ref 15–41)
Albumin: 1.7 g/dL — ABNORMAL LOW (ref 3.5–5.0)
Alkaline Phosphatase: 242 U/L — ABNORMAL HIGH (ref 38–126)
Anion gap: 11 (ref 5–15)
BUN: 9 mg/dL (ref 6–20)
CO2: 29 mmol/L (ref 22–32)
Calcium: 7.9 mg/dL — ABNORMAL LOW (ref 8.9–10.3)
Chloride: 92 mmol/L — ABNORMAL LOW (ref 98–111)
Creatinine, Ser: 2.33 mg/dL — ABNORMAL HIGH (ref 0.61–1.24)
GFR, Estimated: 31 mL/min — ABNORMAL LOW (ref >60)
Glucose, Bld: 135 mg/dL — ABNORMAL HIGH (ref 70–99)
Potassium: 3.4 mmol/L — ABNORMAL LOW (ref 3.5–5.1)
Sodium: 132 mmol/L — ABNORMAL LOW (ref 135–145)
Total Bilirubin: 2.6 mg/dL — ABNORMAL HIGH (ref 0.0–1.2)
Total Protein: 6.2 g/dL — ABNORMAL LOW (ref 6.5–8.1)

## 2024-01-14 LAB — CBC
HCT: 23.3 % — ABNORMAL LOW (ref 39.0–52.0)
Hemoglobin: 7.6 g/dL — ABNORMAL LOW (ref 13.0–17.0)
MCH: 29.6 pg (ref 26.0–34.0)
MCHC: 32.6 g/dL (ref 30.0–36.0)
MCV: 90.7 fL (ref 80.0–100.0)
Platelets: 199 K/uL (ref 150–400)
RBC: 2.57 MIL/uL — ABNORMAL LOW (ref 4.22–5.81)
RDW: 16.5 % — ABNORMAL HIGH (ref 11.5–15.5)
WBC: 18.2 K/uL — ABNORMAL HIGH (ref 4.0–10.5)
nRBC: 0 % (ref 0.0–0.2)

## 2024-01-14 LAB — CBG MONITORING, ED: Glucose-Capillary: 135 mg/dL — ABNORMAL HIGH (ref 70–99)

## 2024-01-14 MED ORDER — INSULIN ASPART 100 UNIT/ML IJ SOLN
0.0000 [IU] | INTRAMUSCULAR | Status: DC
  Administered 2024-01-15: 1 [IU] via SUBCUTANEOUS

## 2024-01-14 MED ORDER — SODIUM CHLORIDE 0.9% FLUSH
3.0000 mL | Freq: Two times a day (BID) | INTRAVENOUS | Status: DC
  Administered 2024-01-15 – 2024-01-20 (×12): 3 mL via INTRAVENOUS

## 2024-01-14 MED ORDER — ACETAMINOPHEN 325 MG PO TABS
650.0000 mg | ORAL_TABLET | Freq: Four times a day (QID) | ORAL | Status: DC | PRN
  Administered 2024-01-15 – 2024-01-16 (×4): 650 mg via ORAL
  Filled 2024-01-14 (×4): qty 2

## 2024-01-14 MED ORDER — MIRTAZAPINE 15 MG PO TABS
7.5000 mg | ORAL_TABLET | Freq: Every day | ORAL | Status: DC
Start: 2024-01-15 — End: 2024-01-21
  Administered 2024-01-16 – 2024-01-21 (×5): 7.5 mg via ORAL
  Filled 2024-01-14 (×5): qty 1

## 2024-01-14 MED ORDER — OXYCODONE HCL 5 MG PO TABS: 5.0000 mg | ORAL_TABLET | ORAL | Status: DC | PRN

## 2024-01-14 MED ORDER — PROCHLORPERAZINE EDISYLATE 10 MG/2ML IJ SOLN
5.0000 mg | Freq: Once | INTRAMUSCULAR | Status: AC
  Administered 2024-01-14: 5 mg via INTRAVENOUS
  Filled 2024-01-14: qty 2

## 2024-01-14 MED ORDER — PROCHLORPERAZINE EDISYLATE 10 MG/2ML IJ SOLN: 5.0000 mg | INTRAMUSCULAR | Status: DC | PRN

## 2024-01-14 MED ORDER — PANTOPRAZOLE SODIUM 40 MG IV SOLR
40.0000 mg | Freq: Two times a day (BID) | INTRAVENOUS | Status: DC
  Administered 2024-01-15 – 2024-01-20 (×11): 40 mg via INTRAVENOUS
  Filled 2024-01-14 (×11): qty 10

## 2024-01-14 MED ORDER — FENTANYL CITRATE PF 50 MCG/ML IJ SOSY: 12.5000 ug | PREFILLED_SYRINGE | INTRAMUSCULAR | Status: DC | PRN

## 2024-01-14 MED ORDER — PANTOPRAZOLE SODIUM 40 MG IV SOLR
40.0000 mg | Freq: Once | INTRAVENOUS | Status: AC
  Administered 2024-01-14: 40 mg via INTRAVENOUS
  Filled 2024-01-14: qty 10

## 2024-01-14 MED ORDER — CALCITRIOL 0.25 MCG PO CAPS
0.2500 ug | ORAL_CAPSULE | Freq: Every day | ORAL | Status: DC
  Administered 2024-01-16 – 2024-01-21 (×5): 0.25 ug via ORAL
  Filled 2024-01-14 (×6): qty 1

## 2024-01-14 MED ORDER — ACETAMINOPHEN 650 MG RE SUPP: 650.0000 mg | Freq: Four times a day (QID) | RECTAL | Status: DC | PRN

## 2024-01-14 NOTE — ED Provider Triage Note (Signed)
 Emergency Medicine Provider Triage Evaluation Note  Jeffrey Young , a 58 y.o. male  was evaluated in triage.  Pt complains of hematemesis.  Review of Systems  Positive: Hematemesis Negative: Syncope  Physical Exam  BP (!) 113/59 (BP Location: Left Arm)   Pulse 80   Temp 99.4 F (37.4 C) (Oral)   Resp 17   Ht 6' 1 (1.854 m)   Wt 74 kg   SpO2 100%   BMI 21.52 kg/m  Patient with emesis bag in hand.  Medical Decision Making  Medically screening exam initiated at 5:35 PM.  Appropriate orders placed.  Alexey Rhoads was informed that the remainder of the evaluation will be completed by another provider, this initial triage assessment does not replace that evaluation, and the importance of remaining in the ED until their evaluation is complete.  Patient with reported hemoglobin of 7 from dialysis.  Recent visit for same.  Is a dialysis patient.  Blood pressure mildly low.  Will need a room.   Patsey Lot, MD 01/14/24 1736

## 2024-01-14 NOTE — ED Triage Notes (Signed)
 Was at dialysis and hgb was 7 so they sent him here. Hx of same with recent transfusion a few weeks ago.  Vomiting blood. Reports yesterday was the last time he saw it and it was just red tinged. Denies pain.   124 glucose

## 2024-01-14 NOTE — ED Provider Notes (Signed)
 [place] EMERGENCY DEPARTMENT AT [facility] Provider Note   CSN: [id number] Arrival date & time: 01/14/24  [id number]     Patient presents with: Abnormal Lab   Jeffrey Young is a 58 y.o. male.    Abnormal Lab Patient sent in from dialysis.  Reportedly had dialysis today and found to have hemoglobin of 7.  Has been vomiting blood.  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     Prior to Admission medications   Medication Sig Start Date End Date Taking? Authorizing Provider  ACCU-CHEK GUIDE test strip USE AS INSTRUCTED TO CHECK BLOOD SUGAR 2 TIMES DAILY 10/18/20   [provider]  calcitRIOL  (ROCALTROL ) 0.25 MCG capsule Take 0.25 mcg by mouth daily. 11/20/23   [provider]  carvedilol  (COREG ) 3.125 MG tablet Take 1 tablet (3.125 mg total) by mouth 2 (two) times daily with a meal. 01/03/24   Dennise Lavada POUR, MD  Continuous Glucose Sensor (DEXCOM G6 SENSOR) MISC Inject 1 Device into the skin See admin instructions. Place a new sensor into the skin every 10 days    [provider]  Continuous Glucose Transmitter (DEXCOM G6 TRANSMITTER) MISC SMARTSIG:Every 3 Months 09/25/23   [provider]  LANTUS  SOLOSTAR 100 UNIT/ML Solostar Pen Inject 10-15 Units into the skin See admin instructions. Inject 15 units into the skin in the morning and 10 units into the skin at bedtime    [provider]  loperamide (IMODIUM) 2 MG capsule Take 2 mg by mouth daily as needed for diarrhea or loose stools.    [provider]  mirtazapine  (REMERON ) 15 MG tablet Take 7.5 mg by mouth daily. 12/26/23   [provider]  oxyCODONE  (ROXICODONE ) 5 MG immediate release tablet Take 1 tablet (5 mg total) by mouth every 6 (six) hours as needed for severe pain (pain score 7-10). Patient not taking: Reported on 01/10/2024 12/06/23   Elna Loud P, PA-C  pantoprazole  (PROTONIX ) 40 MG tablet Take 1 tablet (40 mg  total) by mouth 2 (two) times daily before a meal. Patient not taking: Reported on 01/10/2024 01/03/24   Dennise Lavada POUR, MD  prochlorperazine  (COMPAZINE ) 10 MG tablet Take 1 tablet (10 mg total) by mouth every 6 (six) hours as needed for nausea or vomiting. 10/11/20   Cloretta Arley NOVAK, MD    Allergies: Bee venom and Latex    Review of Systems  Updated Vital Signs BP (!) 113/59 (BP Location: Left Arm)   Pulse 80   Temp 99.4 F (37.4 C) (Oral)   Resp 17   Ht 6' 1 (1.854 m)   Wt 74 kg   SpO2 100%   BMI 21.52 kg/m   Physical Exam Vitals and nursing note reviewed.  Cardiovascular:     Rate and Rhythm: Regular rhythm.  Pulmonary:     Breath sounds: No wheezing.  Abdominal:     Tenderness: There is no abdominal tenderness.  Skin:    Capillary Refill: Capillary refill takes less than 2 seconds.  Neurological:     Mental Status: He is alert.     (all labs ordered are listed, but only abnormal results are displayed) Labs Reviewed  COMPREHENSIVE METABOLIC PANEL WITH GFR - Abnormal; Notable for the following components:      Result Value   Sodium 132 (*)    Potassium 3.4 (*)    Chloride 92 (*)    Glucose, Bld 135 (*)  Creatinine, Ser 2.33 (*)    Calcium  7.9 (*)    Total Protein 6.2 (*)    Albumin  1.7 (*)    Alkaline Phosphatase 242 (*)    Total Bilirubin 2.6 (*)    GFR, Estimated 31 (*)    All other components within normal limits  CBC - Abnormal; Notable for the following components:   WBC 18.2 (*)    RBC 2.57 (*)    Hemoglobin 7.6 (*)    HCT 23.3 (*)    RDW 16.5 (*)    All other components within normal limits  POC OCCULT BLOOD, ED  TYPE AND SCREEN    EKG: None  Radiology: No results found.   Procedures   Medications Ordered in the ED  prochlorperazine  (COMPAZINE ) injection 5 mg (5 mg Intravenous Given 01/14/24 1825)  pantoprazole  (PROTONIX ) injection 40 mg (40 mg Intravenous Given 01/14/24 1845)                                    Medical  Decision Making Amount and/or Complexity of Data Reviewed Labs: ordered.  Risk Prescription drug management. Decision regarding hospitalization.   Patient with hiccups and has had hematemesis.  Recent admission for similar symptoms and had thought to be potentially upper GI bleed.  No active bleeding seen from what I can tell however.  Also does have colon cancer.  Hemoglobin has decreased to 7.6 here.  With decrease after recent transfusion I think patient benefit from admission to the hospital but will not necessarily transfuse immediately.  Will discuss with hospitalist for admission.  Discussed with Dr. Kristie from gastroenterology.  Aware of patient and will see in consult.     Final diagnoses:  Upper GI bleed  End stage renal disease on dialysis [facility])  Metastatic malignant neoplasm, unspecified site [facility])    ED Discharge Orders     None          Patsey Lot, MD 01/14/24 1851

## 2024-01-14 NOTE — H&P (Signed)
 History and Physical    Jeffrey Young FMW:[id number] DOB: [birth year] DOA: 01/14/2024  PCP: [facility]   Patient coming from: Home   Chief Complaint: Hematemesis   HPI: Jeffrey Young is a 58 y.o. male with medical history significant for type 2 diabetes mellitus, chronic HFpEF, ESRD on hemodialysis, metastatic colon cancer, and malignant stricture of the common bile duct status post ERCP with stent placement on 01/02/2024, now presenting with hematemesis.  Patient reports an episode of dark red emesis yesterday and again this morning.  He has had dark loose stool associated with this.  He denies any abdominal pain.  He denies chest pain, fever, or chills.  He completed dialysis today but was told that his hemoglobin was 7 and that he should seek evaluation in the ED.  ED Course: Upon arrival to the ED, patient is found to be afebrile and saturating well on room air with normal HR and stable BP.  Labs are most notable for alkaline phosphatase 242, albumin  1.7, bilirubin 2.6, WBC 18,200, and hemoglobin 7.6.  GI (Dr. Kristie) was consulted by the ED physician, type and screen was performed, and the patient was treated with IV Protonix  and Compazine .  Review of Systems:  All other systems reviewed and apart from HPI, are negative.  Past Medical History:  Diagnosis Date   Arthritis    Hip   CHF (congestive heart failure) (HCC)    Colon cancer (HCC)    Diabetic mononeuropathy associated with type 2 diabetes mellitus (HCC) 03/21/2017   Diabetic retinopathy (HCC) 07/31/2020   Enthesopathy of ankle and tarsus 04/02/2009   Formatting of this note might be different from the original. 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 Type 2 Diabetes Mellitus - Uncomplicated, Uncontrolled    Past Surgical History:  Procedure Laterality Date   AMPUTATION Left 01/22/2018   Procedure: TRANSMETATARSAL AMPUTATION;  Surgeon: Harden Jerona GAILS, MD;  Location: [facility] OR;  Service: Orthopedics;  Laterality: Left;toes   BILIARY STENT PLACEMENT N/A 05/10/2023   Procedure: BILIARY STENT PLACEMENT;  Surgeon: Rollin Dover, MD;  Location: [facility] ENDOSCOPY;  Service: Gastroenterology;  Laterality: N/A;   BILIARY STENT PLACEMENT N/A 05/18/2023   Procedure: BILIARY STENT PLACEMENT;  Surgeon: Rollin Dover, MD;  Location: [facility];  Service: Gastroenterology;  Laterality: N/A;   BILIARY STENT PLACEMENT  01/02/2024   Procedure: INSERTION, STENT, BILE DUCT;  Surgeon: Rollin Dover, MD;  Location: [facility] ENDOSCOPY;  Service: Gastroenterology;;   BIOPSY  07/27/2020   Procedure: BIOPSY;  Surgeon: Rollin Dover, MD;  Location: [facility];  Service: Endoscopy;;   COLON RESECTION N/A 07/30/2020   Procedure: HAND ASSISTED LAPAROSCOPIC LEFT HEMI COLECTOMY;  Surgeon: Gladis Cough, MD;  Location: [facility] ORS;  Service: General;  Laterality: N/A;   COLONOSCOPY WITH PROPOFOL  N/A 05/24/2018   Procedure: COLONOSCOPY WITH PROPOFOL ;  Surgeon: Rollin Dover, MD;  Location: [facility];  Service: Endoscopy;  Laterality: N/A;   COLONOSCOPY WITH PROPOFOL  N/A 07/27/2020   Procedure: COLONOSCOPY WITH PROPOFOL ;  Surgeon: Rollin Dover, MD;  Location: [facility];  Service: Endoscopy;  Laterality: N/A;   DIALYSIS/PERMA CATHETER  INSERTION N/A 11/19/2023   Procedure: DIALYSIS/PERMA CATHETER INSERTION;  Surgeon: [name] [name], MD;  Location: [facility];  Service: Cardiovascular;  Laterality: N/A;   ERCP N/A 05/10/2023   Procedure: ENDOSCOPIC RETROGRADE CHOLANGIOPANCREATOGRAPHY (ERCP);  Surgeon: Rollin Dover, MD;  Location: [facility];  Service: Gastroenterology;  Laterality: N/A;   ERCP N/A 05/18/2023   Procedure: ENDOSCOPIC RETROGRADE CHOLANGIOPANCREATOGRAPHY (ERCP);  Surgeon: Rollin Dover, MD;  Location: [facility];  Service: Gastroenterology;  Laterality: N/A;   ERCP N/A 01/02/2024   Procedure: ERCP, WITH INTERVENTION IF INDICATED;  Surgeon: Rollin Dover, MD;  Location: [facility] ENDOSCOPY;  Service: Gastroenterology;  Laterality: N/A;   ESOPHAGOGASTRODUODENOSCOPY Left 04/05/2021   Procedure: ESOPHAGOGASTRODUODENOSCOPY (EGD);  Surgeon: Rollin Dover, MD;  Location: [facility];  Service: Endoscopy;  Laterality: Left;   ESOPHAGOGASTRODUODENOSCOPY N/A 05/10/2023   Procedure: ESOPHAGOGASTRODUODENOSCOPY (EGD);  Surgeon: Rollin Dover, MD;  Location: [facility];  Service: Gastroenterology;  Laterality: N/A;   EUS N/A 05/10/2023   Procedure: UPPER ENDOSCOPIC ULTRASOUND (EUS) LINEAR;  Surgeon: Rollin Dover, MD;  Location: [facility];  Service: Gastroenterology;  Laterality: N/A;   FINE NEEDLE ASPIRATION N/A 05/10/2023   Procedure: FINE NEEDLE ASPIRATION (FNA) LINEAR;  Surgeon: Rollin Dover, MD;  Location: [facility];  Service: Gastroenterology;  Laterality: N/A;   INSERTION OF ARTERIOVENOUS (AV) ARTEGRAFT ARM Right 12/06/2023   Procedure: INSERTION, GRAFT, ARTERIOVENOUS, UPPER EXTREMITY;  Surgeon: [name] [name], MD;  Location: [facility] OR;  Service: Vascular;  Laterality: Right;   INSERTION OF DIALYSIS CATHETER Right 12/06/2023   Procedure: EXCHANGE OF DIALYSIS CATHETER USING PALINDROME 23CM CATHETER KIT;  Surgeon: [name] [name], MD;  Location[facility] OR;  Service: Vascular;  Laterality: Right;   POLYPECTOMY  05/24/2018    Procedure: POLYPECTOMY;  Surgeon: Rollin Dover, MD;  Location: [facility];  Service: Endoscopy;;   PORTACATH PLACEMENT Left 08/24/2020   Procedure: INSERTION PORT-A-CATH;  Surgeon: Gladis Cough, MD;  Location: [facility];  Service: General;  Laterality: Left;  75/rm1   SPHINCTEROTOMY  05/10/2023   Procedure: ANNETT;  Surgeon: Rollin Dover, MD;  Location: [facility];  Service: Gastroenterology;;   ANNETT  05/18/2023   Procedure: ANNETT;  Surgeon: Rollin Dover, MD;  Location: [facility];  Service: Gastroenterology;;   CLEDA REMOVAL  05/18/2023   Procedure: STENT REMOVAL;  Surgeon: Rollin Dover, MD;  Location: [facility];  Service: Gastroenterology;;   CLEDA REMOVAL  01/02/2024   Procedure: STENT REMOVAL;  Surgeon: Rollin Dover, MD;  Location: [facility] ENDOSCOPY;  Service: Gastroenterology;;   SUBMUCOSAL TATTOO INJECTION  07/27/2020   Procedure: SUBMUCOSAL TATTOO INJECTION;  Surgeon: Rollin Dover, MD;  Location: [facility];  Service: Endoscopy;;    Social History:   reports that he has never smoked. He has never used smokeless tobacco. He reports current alcohol use of about 1.0 standard drink of alcohol per week. He reports that he does not use drugs.  Allergies  Allergen Reactions   Bee Venom Anaphylaxis, Swelling and Other (See Comments)    Cold Sweats, also   Latex Rash and Dermatitis    Family History  Problem Relation Age of Onset   Hypertension Father      Prior to Admission medications   Medication Sig Start Date End Date Taking? Authorizing Provider  ACCU-CHEK GUIDE test strip USE AS INSTRUCTED TO CHECK BLOOD SUGAR 2 TIMES DAILY 10/18/20   [provider]  calcitRIOL  (ROCALTROL ) 0.25 MCG capsule Take 0.25 mcg by mouth daily. 11/20/23   [provider]  carvedilol  (COREG ) 3.125 MG tablet Take 1 tablet (3.125 mg total) by mouth 2 (two) times daily with a meal. 01/03/24   Dennise Lavada POUR, MD  Continuous Glucose Sensor (DEXCOM G6  SENSOR) MISC Inject 1 Device into the skin See admin instructions. Place a new sensor into the skin every 10 days    [provider]  Continuous Glucose Transmitter (DEXCOM G6 TRANSMITTER) MISC SMARTSIG:Every 3 Months 09/25/23   [provider]  LANTUS  SOLOSTAR 100 UNIT/ML Solostar Pen Inject 10-15 Units into the skin See admin instructions. Inject 15 units into the skin in the morning and 10 units into the skin at bedtime    [provider]  loperamide (IMODIUM) 2 MG capsule Take 2 mg by mouth daily as needed for diarrhea or loose stools.    [provider]  mirtazapine  (REMERON ) 15 MG tablet Take 7.5 mg by mouth daily. 12/26/23   [provider]  oxyCODONE  (ROXICODONE ) 5 MG immediate release tablet Take 1 tablet (5 mg total) by mouth every 6 (six) hours as needed for severe pain (pain score 7-10). Patient not taking: Reported on 01/10/2024 12/06/23   Elna Loud P, PA-C  pantoprazole  (PROTONIX ) 40 MG tablet Take 1 tablet (40 mg total) by mouth 2 (two) times daily before a meal. Patient not taking: Reported on 01/10/2024 01/03/24   Dennise Lavada POUR, MD  prochlorperazine  (COMPAZINE ) 10 MG tablet Take 1 tablet (10 mg total) by mouth every 6 (six) hours as needed for nausea or vomiting. 10/11/20   Cloretta Arley NOVAK, MD    Physical Exam: Vitals:   01/14/24 1729 01/14/24 1731  BP: (!) 113/59   Pulse: 80   Resp: 17   Temp: 99.4 F (37.4 C)   TempSrc: Oral   SpO2: 100%   Weight:  74 kg  Height:  6' 1 (1.854 m)     Constitutional: NAD, no pallor or diaphoresis   Eyes: PERTLA, lids and conjunctivae normal ENMT: Mucous membranes are moist. Posterior pharynx clear of any exudate or lesions.   Neck: supple, no masses  Respiratory: no wheezing, no crackles. No accessory muscle use.  Cardiovascular: S1 & S2 heard, regular rate and rhythm. No extremity edema.  Abdomen: No tenderness, soft. Bowel sounds active.  Musculoskeletal: no clubbing / cyanosis. No  joint deformity upper and lower extremities.   Skin: no significant rashes, lesions, ulcers. Warm, dry, well-perfused. Neurologic: CN 2-12 grossly intact. Moving all extremities. Alert and oriented.  Psychiatric: Calm. Cooperative.    Labs and Imaging on Admission: I have personally reviewed following labs and imaging studies  CBC: Recent Labs  Lab 01/10/24 0800 01/14/24 1740  WBC 11.8* 18.2*  NEUTROABS 9.3*  --   HGB 8.0* 7.6*  HCT 23.5* 23.3*  MCV 92.5 90.7  PLT 274 199   Basic Metabolic Panel: Recent Labs  Lab 01/10/24 0800 01/14/24 1740  NA 131* 132*  K 3.4* 3.4*  CL 91* 92*  CO2 28 29  GLUCOSE 179* 135*  BUN 11 9  CREATININE 3.13* 2.33*  CALCIUM  8.6* 7.9*   GFR: Estimated Creatinine Clearance: 35.3 mL/min (A) (by C-G formula based on SCr of 2.33 mg/dL (H)). Liver Function Tests: Recent Labs  Lab 01/10/24 0800 01/14/24 1740  AST 44* 39  ALT 42  33  ALKPHOS 453* 242*  BILITOT 2.4* 2.6*  PROT 6.7 6.2*  ALBUMIN  2.8* 1.7*   No results for input(s): LIPASE, AMYLASE in the last 168 hours. No results for input(s): AMMONIA in the last 168 hours. Coagulation Profile: No results for input(s): INR, PROTIME in the last 168 hours. Cardiac Enzymes: No results for input(s): CKTOTAL, CKMB, CKMBINDEX, TROPONINI in the last 168 hours. BNP (last 3 results) No results for input(s): PROBNP in the last 8760 hours. HbA1C: No results for input(s): HGBA1C in the last 72 hours. CBG: No results for input(s): GLUCAP in the last 168 hours. Lipid Profile: No results for input(s): CHOL, HDL, LDLCALC, TRIG, CHOLHDL, LDLDIRECT in the last 72 hours. Thyroid Function Tests: No results for input(s): TSH, T4TOTAL, FREET4, T3FREE, THYROIDAB in the last 72 hours. Anemia Panel: No results for input(s): VITAMINB12, FOLATE, FERRITIN, TIBC, IRON, RETICCTPCT in the last 72 hours. Urine analysis:    Component Value Date/Time    COLORURINE AMBER (A) 05/20/2023 1728   APPEARANCEUR CLEAR 05/20/2023 1728   LABSPEC 1.011 05/20/2023 1728   PHURINE 5.0 05/20/2023 1728   GLUCOSEU 150 (A) 05/20/2023 1728   HGBUR SMALL (A) 05/20/2023 1728   BILIRUBINUR NEGATIVE 05/20/2023 1728   KETONESUR NEGATIVE 05/20/2023 1728   PROTEINUR >=300 (A) 05/20/2023 1728   NITRITE NEGATIVE 05/20/2023 1728   LEUKOCYTESUR NEGATIVE 05/20/2023 1728   Sepsis Labs: @LABRCNTIP (procalcitonin:4,lacticidven:4) )No results found for this or any previous visit (from the past 240 hours).   Radiological Exams on Admission: No results found.   Assessment/Plan   1. Hematemesis; acute on chronic anemia  - Continue bowel rest and IV PPI, trend CBCs, transfuse if needed, follow-up on GI recommendations    Addendum (04:43): Hgb is down to 6.6 this morning. Plan to transfuse 1 unit RBC and recheck.    2. ESRD  - Completed HD today  - Restrict fluids, renally-dose medications, continue calcitriol    3. Metastatic colon cancer  - S/p partial left colectomy and FOLFOX, recently had biliary stents placed for malignant obstruction  - Continue follow-up with Dr. Cloretta as planned    4. Type II DM  - A1c was 5.4% earlier this month  - Check CBGs and use low-intensity SSI for now    5. Hypertension  - BP stable in ED but with concern for UGIB, will treat as-needed only for now    DVT prophylaxis: SCDs  Code Status: Full  Level of Care: Level of care: Progressive Family Communication: wife at bedside  Disposition Plan:  Patient is from: home  Anticipated d/c is to: Home  Anticipated d/c date is: Possibly as early as 8/26 or 8/27 Patient currently: Pending stable H&H  Consults called: GI  Admission status: Observation     Evalene GORMAN Sprinkles, MD [facility]  01/14/2024, 8:12 PM

## 2024-01-14 NOTE — ED Notes (Signed)
 CCMD called.

## 2024-01-14 NOTE — Progress Notes (Deleted)
 [facility]   Telephone:(336) [phone] Fax:(336) [phone]    Patient Care Team: Pura Lenis, MD as PCP - General (Family Medicine) Raford Riggs, MD as PCP - Cardiology (Cardiology) Harden Jerona GAILS, MD as Consulting Physician (Orthopedic Surgery) Rollin Dover, MD as Consulting Physician (Gastroenterology) Gladis Cough, MD as Consulting Physician (General Surgery) Raford Riggs, MD as Attending Physician (Cardiology) Sheree Penne Bruckner, MD as Consulting Physician (Vascular Surgery) Cloretta Arley NOVAK, MD as Consulting Physician (Oncology)   CHIEF COMPLAINT: Follow-up colon cancer  CURRENT THERAPY: Pending  INTERVAL HISTORY Jeffrey Young returns for follow-up as scheduled for evaluation/consideration for treatment, last seen by Dr. Cloretta 01/10/2024.  ROS   Past Medical History:  Diagnosis Date   Arthritis    Hip   CHF (congestive heart failure) (HCC)    Colon cancer (HCC)    Diabetic mononeuropathy associated with type 2 diabetes mellitus (HCC) 03/21/2017   Diabetic retinopathy (HCC) 07/31/2020   Enthesopathy of ankle and tarsus 04/02/2009   Formatting of this note might be different from the original. 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 Type 2 Diabetes Mellitus - Uncomplicated, Uncontrolled     Past Surgical History:  Procedure Laterality Date   AMPUTATION Left 01/22/2018   Procedure: TRANSMETATARSAL AMPUTATION;  Surgeon: Harden Jerona GAILS, MD;  Location: [facility] OR;  Service: Orthopedics;  Laterality: Left;toes   BILIARY STENT PLACEMENT N/A 05/10/2023   Procedure: BILIARY STENT PLACEMENT;  Surgeon: Rollin Dover, MD;  Location: [facility] ENDOSCOPY;  Service: Gastroenterology;  Laterality: N/A;   BILIARY STENT PLACEMENT N/A 05/18/2023   Procedure: BILIARY STENT PLACEMENT;  Surgeon: Rollin Dover, MD;  Location: [facility];  Service: Gastroenterology;  Laterality: N/A;   BILIARY STENT PLACEMENT  01/02/2024   Procedure: INSERTION, STENT, BILE DUCT;  Surgeon: Rollin Dover, MD;  Location: [facility] ENDOSCOPY;  Service: Gastroenterology;;   BIOPSY  07/27/2020   Procedure: BIOPSY;  Surgeon: Rollin Dover, MD;  Location: [facility]OSCOPY;  Service: Endoscopy;;   COLON RESECTION N/A 07/30/2020   Procedure: HAND ASSISTED LAPAROSCOPIC LEFT HEMI COLECTOMY;  Surgeon: Gladis Cough, MD;  Location: [facility] ORS;  Service: General;  Laterality: N/A;   COLONOSCOPY WITH PROPOFOL  N/A 05/24/2018   Procedure: COLONOSCOPY WITH PROPOFOL ;  Surgeon: Rollin Dover, MD;  Location: [facility];  Service: Endoscopy;  Laterality: N/A;   COLONOSCOPY WITH PROPOFOL  N/A 07/27/2020   Procedure: COLONOSCOPY WITH PROPOFOL ;  Surgeon: Rollin Dover, MD;  Location: [facility];  Service: Endoscopy;  Laterality: N/A;   DIALYSIS/PERMA CATHETER INSERTION N/A 11/19/2023   Procedure: DIALYSIS/PERMA CATHETER INSERTION;  Surgeon: [name] [name], MD;  Location: [facility];  Service: Cardiovascular;  Laterality: N/A;   ERCP N/A 05/10/2023   Procedure: ENDOSCOPIC RETROGRADE CHOLANGIOPANCREATOGRAPHY (ERCP);  Surgeon: Rollin Dover, MD;  Location: [facility];  Service: Gastroenterology;  Laterality: N/A;  ERCP N/A 05/18/2023   Procedure: ENDOSCOPIC RETROGRADE CHOLANGIOPANCREATOGRAPHY (ERCP);   Surgeon: Rollin Dover, MD;  Location: [facility];  Service: Gastroenterology;  Laterality: N/A;   ERCP N/A 01/02/2024   Procedure: ERCP, WITH INTERVENTION IF INDICATED;  Surgeon: Rollin Dover, MD;  Location: [facility] ENDOSCOPY;  Service: Gastroenterology;  Laterality: N/A;   ESOPHAGOGASTRODUODENOSCOPY Left 04/05/2021   Procedure: ESOPHAGOGASTRODUODENOSCOPY (EGD);  Surgeon: Rollin Dover, MD;  Location: [facility];  Service: Endoscopy;  Laterality: Left;   ESOPHAGOGASTRODUODENOSCOPY N/A 05/10/2023   Procedure: ESOPHAGOGASTRODUODENOSCOPY (EGD);  Surgeon: Rollin Dover, MD;  Location: [facility];  Service: Gastroenterology;  Laterality: N/A;   EUS N/A 05/10/2023   Procedure: UPPER ENDOSCOPIC ULTRASOUND (EUS) LINEAR;  Surgeon: Rollin Dover, MD;  Location: [facility];  Service: Gastroenterology;  Laterality: N/A;   FINE NEEDLE ASPIRATION N/A 05/10/2023   Procedure: FINE NEEDLE ASPIRATION (FNA) LINEAR;  Surgeon: Rollin Dover, MD;  Location: [facility];  Service: Gastroenterology;  Laterality: N/A;   INSERTION OF ARTERIOVENOUS (AV) ARTEGRAFT ARM Right 12/06/2023   Procedure: INSERTION, GRAFT, ARTERIOVENOUS, UPPER EXTREMITY;  Surgeon: [name] [name], MD;  Location: [facility] OR;  Service: Vascular;  Laterality: Right;   INSERTION OF DIALYSIS CATHETER Right 12/06/2023   Procedure: EXCHANGE OF DIALYSIS CATHETER USING PALINDROME 23CM CATHETER KIT;  Surgeon: [name] [name], MD;  Location: [facility] OR;  Service: Vascular;  Laterality: Right;   POLYPECTOMY  05/24/2018   Procedure: POLYPECTOMY;  Surgeon: Rollin Dover, MD;  Location: [facility];  Service: Endoscopy;;   PORTACATH PLACEMENT Left 08/24/2020   Procedure: INSERTION PORT-A-CATH;  Surgeon: Gladis Cough, MD;  Location: [facility]S;  Service: General;  Laterality: Left;  75/rm1   SPHINCTEROTOMY  05/10/2023   Procedure: ANNETT;  Surgeon: Rollin Dover, MD;  Location: [facility];  Service: Gastroenterology;;   ANNETT  05/18/2023   Procedure:  ANNETT;  Surgeon: Rollin Dover, MD;  Location: [facility];  Service: Gastroenterology;;   CLEDA REMOVAL  05/18/2023   Procedure: STENT REMOVAL;  Surgeon: Rollin Dover, MD;  Location: [facility];  Service: Gastroenterology;;   CLEDA REMOVAL  01/02/2024   Procedure: STENT REMOVAL;  Surgeon: Rollin Dover, MD;  Location: [facility] ENDOSCOPY;  Service: Gastroenterology;;   SUBMUCOSAL TATTOO INJECTION  07/27/2020   Procedure: SUBMUCOSAL TATTOO INJECTION;  Surgeon: Rollin Dover, MD;  Location: [facility] ENDOSCOPY;  Service: Endoscopy;;     Outpatient Encounter Medications as of 01/16/2024  Medication Sig Note   ACCU-CHEK GUIDE test strip USE AS INSTRUCTED TO CHECK BLOOD SUGAR 2 TIMES DAILY    calcitRIOL  (ROCALTROL ) 0.25 MCG capsule Take 0.25 mcg by mouth daily.    carvedilol  (COREG ) 3.125 MG tablet Take 1 tablet (3.125 mg total) by mouth 2 (two) times daily with a meal.    Continuous Glucose Sensor (DEXCOM G6 SENSOR) MISC Inject 1 Device into the skin See admin instructions. Place a new sensor into the skin every 10 days 06/12/2023: .   Continuous Glucose Transmitter (DEXCOM G6 TRANSMITTER) MISC SMARTSIG:Every 3 Months    LANTUS  SOLOSTAR 100 UNIT/ML Solostar Pen Inject 10-15 Units into the skin See admin instructions. Inject 15 units into the skin in the morning and 10 units into the skin at bedtime    loperamide (IMODIUM) 2 MG capsule Take 2 mg by mouth daily as needed for diarrhea or loose stools.    mirtazapine  (REMERON ) 15 MG tablet Take 7.5 mg by mouth daily.    oxyCODONE  (ROXICODONE ) 5 MG immediate release tablet Take 1 tablet (5 mg total) by mouth every 6 (six) hours as needed for  severe pain (pain score 7-10). (Patient not taking: Reported on 01/10/2024)    pantoprazole  (PROTONIX ) 40 MG tablet Take 1 tablet (40 mg total) by mouth 2 (two) times daily before a meal. (Patient not taking: Reported on 01/10/2024)    prochlorperazine  (COMPAZINE ) 10 MG tablet Take 1 tablet (10 mg total) by mouth every 6  (six) hours as needed for nausea or vomiting.    Facility-Administered Encounter Medications as of 01/16/2024  Medication   sodium chloride  flush (NS) 0.9 % injection 10 mL     There were no vitals filed for this visit. There is no height or weight on file to calculate BMI.   ECOG PERFORMANCE STATUS: {CHL ONC ECOG [phone]}  PHYSICAL EXAM GENERAL:alert, no distress and comfortable SKIN: no rash  EYES: sclera clear NECK: without mass LYMPH:  no palpable cervical or supraclavicular lymphadenopathy  LUNGS: clear with normal breathing effort HEART: regular rate & rhythm, no lower extremity edema ABDOMEN: abdomen soft, non-tender and normal bowel sounds NEURO: alert & oriented x 3 with fluent speech, no focal motor/sensory deficits Breast exam:  PAC without erythema    CBC    Latest Ref Rng & Units 01/10/2024    8:00 AM 01/03/2024    6:07 AM 01/02/2024    4:49 AM  CBC  WBC 4.0 - 10.5 K/uL 11.8  7.5  7.2   Hemoglobin 13.0 - 17.0 g/dL 8.0  8.7  9.3   Hematocrit 39.0 - 52.0 % 23.5  26.0  26.4   Platelets 150 - 400 K/uL 274  159  174       CMP     Latest Ref Rng & Units 01/10/2024    8:00 AM 01/02/2024    4:49 AM 01/01/2024    2:51 AM  CMP  Glucose 70 - 99 mg/dL 820  887  897   BUN 6 - 20 mg/dL 11  27  15    Creatinine 0.61 - 1.24 mg/dL 6.86  5.61  6.98   Sodium 135 - 145 mmol/L 131  134  133   Potassium 3.5 - 5.1 mmol/L 3.4  3.4  3.3   Chloride 98 - 111 mmol/L 91  95  92   CO2 22 - 32 mmol/L 28  24  28    Calcium  8.9 - 10.3 mg/dL 8.6  8.4  7.9   Total Protein 6.5 - 8.1 g/dL 6.7   5.3   Total Bilirubin 0.0 - 1.2 mg/dL 2.4   5.5   Alkaline Phos 38 - 126 U/L 453   332   AST 15 - 41 U/L 44   23   ALT 0 - 44 U/L 42   25       ASSESSMENT & PLAN: Descending colon cancer, stage IIIc (T3N2b M0), status post a partial left colectomy 07/30/2020, 9/16 lymph nodes positive, lymphovascular invasion, 1 satellite nodule, negative margins, MSS, no loss of mismatch repair protein  expression; foundation 1 K-ras wild-type, NRAS Q61H, microsatellite stable, tumor mutation burden 4. -History of large polyp in the left side of the colon-referred to [facility] in 05/2018 for procedure canceled secondary to COVID-19 pandemic.  Procedure was not rescheduled. -CT chest/abdomen/pelvis with contrast 07/27/2020-3 small pulmonary nodules less than 5 mm favored to be benign, circumferential luminal narrowing of the distal transverse colon concerning for malignancy, no metastatic adenopathy in the mesentery porta hepatis, no for metastasis. -CEA on 07/27/2020 was 17.3; 37 on 08/30/2020; 33 on 09/13/2020 -Colonoscopy performed 07/27/2020 showed a fungating, infiltrative and ulcerated nonobstructing large  mass in the proximal descending colon.  Biopsy-adenocarcinoma -Cycle 1 FOLFOX 08/30/2020 -Cycle 2 FOLFOX 09/13/2020, Emend  added for delayed nausea -Cycle 3 FOLFOX 09/27/2020 -Cycle 4 FOLFOX 10/11/2020 -Cycle 5 FOLFOX 11/08/2020 -Cycle 6 FOLFOX 11/23/2020 -CT 12/03/2020-prior 3 mm left apical nodule no longer seen, no new/suspicious pulmonary nodules, no evidence of metastatic disease -Cycle 7 FOLFOX 12/06/2020 -Cycle 8 FOLFOX 12/21/2020 -Cycle 9 FOLFOX 01/03/2021 -Cycle 10 FOLFOX 01/17/2021 -Cycle 11 FOLFOX 01/31/2021, oxaliplatin  held secondary to neuropathy symptoms -Cycle 12 FOLFOX 02/15/2021, oxaliplatin  held secondary to neuropathy -CT abdomen/pelvis 04/02/2021-no evidence of recurrent colon cancer -CT chest 04/08/2021-no evidence of metastatic disease -Elevated CEA February and March 2023 -08/17/2021 PET scan-no evidence of local recurrence or metastasis -09/26/2021-Guardant-ctDNA detected -Colonoscopy 10/04/2021-negative -CT 11/17/2021-no evidence of metastatic disease -CTs 01/08/2022-no evidence of metastatic disease -PET scan 08/10/2022-several newly enlarged lymph nodes with moderate metabolic activity -CT 11/06/2022-stable left supraclavicular and retroperitoneal nodes, mild progression of pelvic adenopathy,  new 6 mm segment 2 liver lesion -CT 03/05/2023-increase size of left supraclavicular node and retroperitoneal nodes, increased size of gastropathic and left iliac side chain lymph nodes, stable subtle hypoattenuating lesions at segment 2 in the hepatic dome -CT abdomen/pelvis 05/07/2023-new intrahepatic and extrahepatic biliary duct dilatation with possible associated periportal edema.  Common duct dilated up to 15 mm diameter, abruptly terminates prior to entering the head of the pancreas.  Distended gallbladder with possible gallbladder wall thickening and pericholecystic edema.  No substantial change in lymphadenopathy of the gastrohepatic ligament, hepatoduodenal ligament and left external iliac chain. 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  Moderate diffuse intrahepatic and proximal extrahepatic biliary duct dilatation.  Mild gallbladder distention with moderate diffuse gallbladder wall thickening.  Minimal pericholecystic fluid and fat stranding.  Solitary 1.1 cm anterior segment 2 left liver mass.  Multiple mildly enlarged porta hepatis and portacaval nodes. -Biopsy perihepatic lymph node 05/10/2023-malignant cells, adenocarcinoma -CTs 07/02/2023-persistent biliary duct dilatation with indwelling stent.  Persistent wall thickening of the gallbladder.  Subtle areas of nodal enlargement identified in the abdomen and pelvis, similar to recent prior examinations.  Enlarged node in the supraclavicular region appears smaller.  No new thoracic nodes.  Mild areas of groundglass in the upper lobes of the lungs bilaterally, nonspecific. - CTs 10/13/2023: Interval growth of a periportal node, nonpathologically enlarged left supraclavicular retroperitoneal nodes, persistent mild intra Paddock biliary duct dilatation with common bile duct stents in place -CT abdomen/pelvis 01/01/2024: Vague  1.4 cm mass bulging the anterior contour of segment 3, intra P[place]ock and exophytic bile duct dilation-increased, distended gallbladder, mass anterior to the superior margin of the pancreas body, enlarged gastropathic ligament and porta hepatis nodes, small pleural effusions 2.  Anemia due to GI bleeding, iron deficiency?,  Renal insufficiency? 3.  New onset acute diastolic CHF March 2022 4.  Diabetes mellitus 5.  Renal failure-dialysis catheter placed 11/19/2023.  Dialysis scheduled to begin 11/21/2023 Monday Wednesday Friday schedule. 6.  Hypertension 7.  History of left transmetatarsal amputation 8.  History of colon polyps 9.  Neuropathy 10.  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  Admission with obstructive jaundice 05/07/2023 - 05/22/2023.  Upper EUS 05/10/2023-lymph node visualized and measured in the porta hepatis region; FNA showed malignant cells, adenocarcinoma.  ERCP 05/10/2023-biliary tract obstruction secondary to a mass in the common bile duct, plastic stent placed into the common  bile duct.  ERCP 05/18/2023-visibly patent stent from the common bile duct was seen in the major papilla.  Stent removed from the common bile duct.  2 plastic stents placed into the common bile duct. 12/31/2023 nausea/jaundice: ERCP 01/02/2024-occlusion of plastic bile duct stents-removed, an uncovered metal stent was placed, a single stricture was found in the common bile duct, clot coming from the bile duct 13.  Admission 12/31/2023 with hematemesis and severe anemia, EGD 01/02/2024: No evidence of a bleeding source in the upper GI tract   PLAN:  No orders of the defined types were placed in this encounter.     All questions were answered. The patient knows to call the clinic with any problems, questions or concerns. No barriers to learning were detected. I spent *** counseling the patient face to face. The  total time spent in the appointment was *** and more than 50% was on counseling, review of test results, and coordination of care.   Jeffrey Manganiello K Khaalid Lefkowitz, NP 01/14/2024 12:29 PM

## 2024-01-15 ENCOUNTER — Encounter (HOSPITAL_COMMUNITY)
Admission: EM | Disposition: A | Discharge status: Home Health | Payer: Self-pay | Source: Home / Self Care | Attending: Internal Medicine

## 2024-01-15 ENCOUNTER — Observation Stay (HOSPITAL_COMMUNITY): Admitting: Anesthesiology

## 2024-01-15 DIAGNOSIS — N186 End stage renal disease: Secondary | ICD-10-CM

## 2024-01-15 DIAGNOSIS — I5032 Chronic diastolic (congestive) heart failure: Secondary | ICD-10-CM

## 2024-01-15 DIAGNOSIS — K209 Esophagitis, unspecified without bleeding: Secondary | ICD-10-CM

## 2024-01-15 DIAGNOSIS — I132 Hypertensive heart and chronic kidney disease with heart failure and with stage 5 chronic kidney disease, or end stage renal disease: Secondary | ICD-10-CM

## 2024-01-15 DIAGNOSIS — K920 Hematemesis: Secondary | ICD-10-CM | POA: Diagnosis not present

## 2024-01-15 HISTORY — PX: ESOPHAGOGASTRODUODENOSCOPY: SHX5428

## 2024-01-15 LAB — CBC
HCT: 20.4 % — ABNORMAL LOW (ref 39.0–52.0)
HCT: 27.4 % — ABNORMAL LOW (ref 39.0–52.0)
Hemoglobin: 6.6 g/dL — CL (ref 13.0–17.0)
Hemoglobin: 9.2 g/dL — ABNORMAL LOW (ref 13.0–17.0)
MCH: 29.9 pg (ref 26.0–34.0)
MCH: 30 pg (ref 26.0–34.0)
MCHC: 32.4 g/dL (ref 30.0–36.0)
MCHC: 33.6 g/dL (ref 30.0–36.0)
MCV: 92.3 fL (ref 80.0–100.0)
MCV: 89.3 fL (ref 80.0–100.0)
Platelets: 171 K/uL (ref 150–400)
Platelets: 190 K/uL (ref 150–400)
RBC: 2.21 MIL/uL — ABNORMAL LOW (ref 4.22–5.81)
RBC: 3.07 MIL/uL — ABNORMAL LOW (ref 4.22–5.81)
RDW: 16.7 % — ABNORMAL HIGH (ref 11.5–15.5)
RDW: 16.5 % — ABNORMAL HIGH (ref 11.5–15.5)
WBC: 12.9 K/uL — ABNORMAL HIGH (ref 4.0–10.5)
WBC: 15.9 K/uL — ABNORMAL HIGH (ref 4.0–10.5)
nRBC: 0 % (ref 0.0–0.2)
nRBC: 0 % (ref 0.0–0.2)

## 2024-01-15 LAB — BASIC METABOLIC PANEL WITH GFR
Anion gap: 10 (ref 5–15)
BUN: 14 mg/dL (ref 6–20)
CO2: 27 mmol/L (ref 22–32)
Calcium: 7.8 mg/dL — ABNORMAL LOW (ref 8.9–10.3)
Chloride: 93 mmol/L — ABNORMAL LOW (ref 98–111)
Creatinine, Ser: 3.32 mg/dL — ABNORMAL HIGH (ref 0.61–1.24)
GFR, Estimated: 20 mL/min — ABNORMAL LOW (ref >60)
Glucose, Bld: 145 mg/dL — ABNORMAL HIGH (ref 70–99)
Potassium: 3.1 mmol/L — ABNORMAL LOW (ref 3.5–5.1)
Sodium: 130 mmol/L — ABNORMAL LOW (ref 135–145)

## 2024-01-15 LAB — PREPARE RBC (CROSSMATCH)

## 2024-01-15 LAB — HEPATIC FUNCTION PANEL
ALT: 30 U/L (ref 0–44)
AST: 40 U/L (ref 15–41)
Albumin: 1.5 g/dL — ABNORMAL LOW (ref 3.5–5.0)
Alkaline Phosphatase: 200 U/L — ABNORMAL HIGH (ref 38–126)
Bilirubin, Direct: 0.8 mg/dL — ABNORMAL HIGH (ref 0.0–0.2)
Indirect Bilirubin: 1.4 mg/dL — ABNORMAL HIGH (ref 0.3–0.9)
Total Bilirubin: 2.2 mg/dL — ABNORMAL HIGH (ref 0.0–1.2)
Total Protein: 5.4 g/dL — ABNORMAL LOW (ref 6.5–8.1)

## 2024-01-15 LAB — HEMOGLOBIN AND HEMATOCRIT, BLOOD
HCT: 27.8 % — ABNORMAL LOW (ref 39.0–52.0)
Hemoglobin: 9.3 g/dL — ABNORMAL LOW (ref 13.0–17.0)

## 2024-01-15 LAB — GLUCOSE, CAPILLARY
Glucose-Capillary: 151 mg/dL — ABNORMAL HIGH (ref 70–99)
Glucose-Capillary: 155 mg/dL — ABNORMAL HIGH (ref 70–99)
Glucose-Capillary: 146 mg/dL — ABNORMAL HIGH (ref 70–99)

## 2024-01-15 LAB — CBG MONITORING, ED
Glucose-Capillary: 117 mg/dL — ABNORMAL HIGH (ref 70–99)
Glucose-Capillary: 144 mg/dL — ABNORMAL HIGH (ref 70–99)
Glucose-Capillary: 139 mg/dL — ABNORMAL HIGH (ref 70–99)

## 2024-01-15 SURGERY — EGD (ESOPHAGOGASTRODUODENOSCOPY)
Anesthesia: Monitor Anesthesia Care

## 2024-01-15 MED ORDER — CHLORHEXIDINE GLUCONATE CLOTH 2 % EX PADS
6.0000 | MEDICATED_PAD | Freq: Every day | CUTANEOUS | Status: DC
  Administered 2024-01-15 – 2024-01-19 (×5): 6 via TOPICAL

## 2024-01-15 MED ORDER — ANTICOAGULANT SODIUM CITRATE 4% (200MG/5ML) IV SOLN
5.0000 mL | Status: DC | PRN
  Filled 2024-01-15: qty 5

## 2024-01-15 MED ORDER — PROPOFOL 10 MG/ML IV BOLUS
INTRAVENOUS | Status: DC | PRN
Start: 2024-01-15 — End: 2024-01-15
  Administered 2024-01-15 (×3): 20 mg via INTRAVENOUS

## 2024-01-15 MED ORDER — SODIUM CHLORIDE 0.9 % IV SOLN: INTRAVENOUS | Status: DC

## 2024-01-15 MED ORDER — LIDOCAINE HCL (PF) 1 % IJ SOLN
5.0000 mL | INTRAMUSCULAR | Status: DC | PRN
  Filled 2024-01-15: qty 5

## 2024-01-15 MED ORDER — LIDOCAINE HCL (PF) 2 % IJ SOLN
INTRAMUSCULAR | Status: DC | PRN
  Administered 2024-01-15: 60 mg via INTRADERMAL

## 2024-01-15 MED ORDER — SODIUM CHLORIDE 0.9% IV SOLUTION: Freq: Once | INTRAVENOUS | Status: DC

## 2024-01-15 MED ORDER — INSULIN ASPART 100 UNIT/ML IJ SOLN
0.0000 [IU] | Freq: Three times a day (TID) | INTRAMUSCULAR | Status: DC
  Administered 2024-01-16 – 2024-01-20 (×9): 1 [IU] via SUBCUTANEOUS

## 2024-01-15 MED ORDER — LIDOCAINE-PRILOCAINE 2.5-2.5 % EX CREA
1.0000 | TOPICAL_CREAM | CUTANEOUS | Status: DC | PRN
  Filled 2024-01-15: qty 5

## 2024-01-15 MED ORDER — PENTAFLUOROPROP-TETRAFLUOROETH EX AERO
1.0000 | INHALATION_SPRAY | CUTANEOUS | Status: DC | PRN
Start: 2024-01-15 — End: 2024-01-16

## 2024-01-15 MED ORDER — ALTEPLASE 2 MG IJ SOLR
2.0000 mg | Freq: Once | INTRAMUSCULAR | Status: DC | PRN
  Filled 2024-01-15: qty 2

## 2024-01-15 MED ORDER — HEPARIN SODIUM (PORCINE) 1000 UNIT/ML DIALYSIS
1000.0000 [IU] | INTRAMUSCULAR | Status: DC | PRN
  Administered 2024-01-16: 3800 [IU]
  Filled 2024-01-15 (×2): qty 1

## 2024-01-15 MED ORDER — PROPOFOL 500 MG/50ML IV EMUL
INTRAVENOUS | Status: DC | PRN
  Administered 2024-01-15: 100 ug/kg/min via INTRAVENOUS

## 2024-01-15 MED ORDER — SODIUM CHLORIDE 0.9 % IV SOLN
INTRAVENOUS | Status: DC | PRN
Start: 2024-01-15 — End: 2024-01-15

## 2024-01-15 NOTE — ED Notes (Signed)
 CCMD is monitoring this pt.

## 2024-01-15 NOTE — Plan of Care (Signed)
  Problem: Education: Goal: Ability to describe self-care measures that may prevent or decrease complications (Diabetes Survival Skills Education) will improve Outcome: Progressing   Problem: Coping: Goal: Ability to adjust to condition or change in health will improve Outcome: Progressing   Problem: Fluid Volume: Goal: Ability to maintain a balanced intake and output will improve Outcome: Progressing   Problem: Health Behavior/Discharge Planning: Goal: Ability to identify and utilize available resources and services will improve Outcome: Progressing   Problem: Skin Integrity: Goal: Risk for impaired skin integrity will decrease Outcome: Progressing

## 2024-01-15 NOTE — Progress Notes (Signed)
 Patient off of unit for ENDO

## 2024-01-15 NOTE — Anesthesia Procedure Notes (Signed)
 Procedure Name: MAC Date/Time: 01/15/2024 2:14 PM  Performed by: Arvell Edsel HERO, CRNAPre-anesthesia Checklist: Patient identified, Emergency Drugs available, Suction available, Patient being monitored and Timeout performed Patient Re-evaluated:Patient Re-evaluated prior to induction Oxygen Delivery Method: Simple face mask

## 2024-01-15 NOTE — Plan of Care (Signed)

## 2024-01-15 NOTE — Hospital Course (Signed)
 58 y.o. M with colon cancer metastatic to LNs and biliary tree c/b malignant biliary obstruction s/p stent 8/13 by Dr. Rollin, ESRD on HD MWF, dCHF< HTN and DM who presented with hematemesis.    Started on PPI, GI consulted.  Transfuse 1 unit on admission for hemoglobin 6.6.

## 2024-01-15 NOTE — Progress Notes (Signed)
  Progress Note   Patient: Jeffrey Young FMW:[id number] DOB: [birth year] DOA: 01/14/2024     0 DOS: the patient was seen and examined on 01/15/2024 at 7:40 AM      Brief hospital course: 58 y.o. M with colon cancer metastatic to LNs and biliary tree c/b malignant biliary obstruction s/p stent 8/13 by Dr. Rollin, ESRD on HD MWF, dCHF< HTN and DM who presented with hematemesis.    Started on PPI, GI consulted.  Transfuse 1 unit on admission for hemoglobin 6.6.     Assessment and Plan: Acute GI bleed On recent ERCP for stent placement, there was clot coming from his biliary stent, but other than that only some mild esophagitis and no clear source of bleeding.  Here he has hematemesis and hemoglobin is dropped to 6.6 - Continue PPI - Consult GI, appreciate recommendations  Acute blood loss anemia Baseline hemoglobin appears to be between 7 and 11 in the setting of end-stage renal disease.  This morning is dropped to 6.6 - Transfuse 1 unit - Transfusion threshold 7 g/dL - Serial hemoglobins, starting after transfusion  ESRD - Consult nephrology for routine HD - MBD, electrolyte management per nephrology  Colon cancer, metastatic to lymph nodes and biliary tree   Follows with Dr. Cloretta. -Continue mirtazapine   Diabetes Glucose controlled - Sliding scale corrections  Hypertension Blood pressure normal off medications - Hold amlodipine , carvedilol , furosemide , losartan until hemodynamics clear  Hypokalemia - Treat with dialysis  Hyponatremia Mild, asymptomatic -Trend BMP           Subjective: No vomiting overnight, no further bowel movements.  Hemoglobin this morning was low so he is getting a blood transfusion right now.  EGD is pending today.     Physical Exam: BP (!) 120/50   Pulse 77   Temp 98 F (36.7 C) (Oral)   Resp 17   Ht 6' 1 (1.854 m)   Wt 74 kg   SpO2 98%   BMI 21.52 kg/m   Adult male, lying on his side, makes eye contact, appears to  have wan complexion RRR, no murmurs, no peripheral edema Respiratory rate normal, lungs clear without rales or wheezes Abdomen soft, no tenderness to palpation Sleepy but attentive to conversation, appears oriented, moves upper extremities with normal strength and coordination, speech fluent    Data Reviewed: Discussed with GI Basic metabolic panel shows mild hypokalemia and hyponatremia CBC shows mild leukocytosis, hemoglobin down to 6.6, normal platelets Recent EGD report reviewed    Family Communication: Wife at the bedside    Disposition: Status is: Observation 58 year old M with metastatic colon cancer and end-stage renal disease, admitted with hematemesis  To get EGD today after blood transfusion.  Further plans depending on findings of EGD        Author: [name], MD 01/15/2024 8:01 AM  For on call review [email or web address].

## 2024-01-15 NOTE — ED Notes (Signed)
 Patient left the floor in stable condition, AOX4, with his belongings and staff.

## 2024-01-15 NOTE — Progress Notes (Signed)
 Patient arrived back to unit. VS stable. No complaints voiced. Currently resting in bed watching tv.

## 2024-01-15 NOTE — Progress Notes (Addendum)
 Pt receives out-pt HD at [facility] on MWF. [facility] left for [facility] CS[facility] to obtain pt's chair time. Will assist as needed.   Randine Mungo Dialysis Navigator [phone]  Addendum at 3:38 pm: Pt receives out-pt HD on MWF 2nd shift.

## 2024-01-15 NOTE — Op Note (Signed)
 [facility] Patient Name: Jeffrey Young Procedure Date : 01/15/2024 MRN: [id number] Attending MD: Belvie Just , MD, [id number] Date of Birth: 26-Mar-1966 CSN: [id number] Age: 58 Admit Type: Inpatient Procedure:                Upper GI endoscopy Indications:              Hematemesis Providers:                Belvie Just, MD, Ritta Debbie Alert, RN,                            Fairy Marina, Technician Referring MD:              Medicines:                Propofol  per Anesthesia Complications:            No immediate complications. Estimated Blood Loss:     Estimated blood loss: none. Procedure:                Pre-Anesthesia Assessment:                           - Prior to the procedure, a History and Physical                            was performed, and patient medications and                            allergies were reviewed. The patient's tolerance of                            previous anesthesia was also reviewed. The risks                            and benefits of the procedure and the sedation                            options and risks were discussed with the patient.                            All questions were answered, and informed consent                            was obtained. Prior Anticoagulants: The patient has                            taken no anticoagulant or antiplatelet agents. ASA                            Grade Assessment: III - A patient with severe                            systemic disease. After reviewing the risks and  benefits, the patient was deemed in satisfactory                            condition to undergo the procedure.                           - Sedation was administered by an anesthesia                            professional. Deep sedation was attained.                           After obtaining informed consent, the endoscope was                            passed under direct vision.  Throughout the                            procedure, the patient's blood pressure, pulse, and                            oxygen saturations were monitored continuously. The                            [id number]-[id number] ([id number]) Olympus endoscope was introduced                            through the mouth, and advanced to the second part                            of duodenum. The upper GI endoscopy was                            accomplished without difficulty. The patient                            tolerated the procedure well. Scope In: Scope Out: Findings:      LA Grade D (one or more mucosal breaks involving at least 75% of       esophageal circumference) esophagitis with no bleeding was found in the       entire esophagus.      A 2 cm hiatal hernia was present.      One non-bleeding cratered gastric ulcer with an adherent clot (Forrest       Class IIb) was found on the lesser curvature of the stomach. The lesion       was 8 mm in largest dimension. Biopsies were taken with a cold forceps       for histology. Area was successfully injected with 3 mL PuraStat for       hemostasis.      The examined duodenum was normal.      An LA Grade D esophagitis reached to the distal portion of the upper       esophagus. In the gastric lumen, along the proximal portion of the       lesser curvature a lesion was identified. This was an  ulceration with       soft heaped up edges. It is not clear if this was a metastati focus.       Multiple cold biopsies were obtained and the edges were soft and       friable. Some oozing occurred, as expected, but the bleeding was       self-limited. PuraStat was injected onto the site to help control any       potential future bleeding. It was not clear that a hemoclip would stand       attached to the area with the very soft friable edges. Impression:               - LA Grade D reflux esophagitis with no bleeding.                           - 2 cm hiatal hernia.                            - Non-bleeding gastric ulcer with an adherent clot                            (Forrest Class IIb). Biopsied. Injected.                           - Normal examined duodenum. Recommendation:           - Return patient to hospital ward for ongoing care.                           - Resume regular diet in 2 hours.                           - Continue present medications.                           - Await pathology results.                           - Monitor HGB and transfuse as necessary.                           - PPI QD indefinitely. Procedure Code(s):        --- Professional ---                           43255, 59, Esophagogastroduodenoscopy, flexible,                            transoral; with control of bleeding, any method                           43239, Esophagogastroduodenoscopy, flexible,                            transoral; with biopsy, single or multiple Diagnosis Code(s):        --- Professional ---  K21.00, Gastro-esophageal reflux disease with                            esophagitis, without bleeding                           K44.9, Diaphragmatic hernia without obstruction or                            gangrene                           K25.4, Chronic or unspecified gastric ulcer with                            hemorrhage                           K92.0, Hematemesis CPT copyright 2022 American Medical Association. All rights reserved. The codes documented in this report are preliminary and upon coder review may  be revised to meet current compliance requirements. Belvie Just, MD Belvie Just, MD 01/15/2024 2:37:07 PM This report has been signed electronically. Number of Addenda: 0

## 2024-01-15 NOTE — Anesthesia Preprocedure Evaluation (Addendum)
 Anesthesia Evaluation  Patient identified by MRN, date of birth, ID band Patient awake    Reviewed: Allergy & Precautions, NPO status , Patient's Chart, lab work & pertinent test results  Airway Mallampati: II  TM Distance: >3 FB Neck ROM: Full    Dental  (+) Missing, Edentulous Upper   Pulmonary    Pulmonary exam normal        Cardiovascular hypertension, Pt. on medications +CHF  Normal cardiovascular exam     Neuro/Psych    GI/Hepatic   Endo/Other  diabetes, Insulin  Dependent    Renal/GU ESRF and DialysisRenal disease     Musculoskeletal  (+) Arthritis ,    Abdominal   Peds  Hematology  (+) Blood dyscrasia, anemia   Anesthesia Other Findings Hematemesis  Reproductive/Obstetrics                              Anesthesia Physical Anesthesia Plan  ASA: 3  Anesthesia Plan: MAC   Post-op Pain Management:    Induction:   PONV Risk Score and Plan: 1 and Propofol  infusion and Treatment may vary due to age or medical condition  Airway Management Planned: Nasal Cannula  Additional Equipment:   Intra-op Plan:   Post-operative Plan:   Informed Consent: I have reviewed the patients History and Physical, chart, labs and discussed the procedure including the risks, benefits and alternatives for the proposed anesthesia with the patient or authorized representative who has indicated his/her understanding and acceptance.       Plan Discussed with: CRNA  Anesthesia Plan Comments:          Anesthesia Quick Evaluation

## 2024-01-15 NOTE — Transfer of Care (Signed)
 Immediate Anesthesia Transfer of Care Note  Patient: Jeffrey Young  Procedure(s) Performed: EGD (ESOPHAGOGASTRODUODENOSCOPY)  Patient Location: Endoscopy Unit  Anesthesia Type:MAC  Level of Consciousness: drowsy and patient cooperative  Airway & Oxygen Therapy: Patient Spontanous Breathing and Patient connected to face mask oxygen  Post-op Assessment: Report given to RN, Post -op Vital signs reviewed and stable, Patient moving all extremities, and Patient moving all extremities X 4  Post vital signs: Reviewed and stable  Last Vitals:  Vitals Value Taken Time  BP 100/54 01/15/24 14:25  Temp 36.7 C 01/15/24 14:25  Pulse 75 01/15/24 14:29  Resp 24 01/15/24 14:29  SpO2 100 % 01/15/24 14:29  Vitals shown include unfiled device data.  Last Pain:  Vitals:   01/15/24 1425  TempSrc: Temporal  PainSc: Asleep         Complications: No notable events documented.

## 2024-01-15 NOTE — Consult Note (Signed)
 [facility] Renal Consultation Note    Indication for Consultation:  Management of ESRD/hemodialysis, anemia, hypertension/volume, and secondary hyperparathyroidism.   HPI: Jeffrey Young is a 58 y.o. 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 He was admitted to the hospital due to hematemesis. On admission his hgb was 6.6. He was given 1 unit of PRBC. GI consulted today and they plan for EGD today.  Patient was very lethargic when I saw and examined him in his room. He was accompanied by his wife. She tells me that his last HD was 01/14/24. She told me that he came off of HD early by 30 minutes. His episode of hematemesis happened after HD. He is very adherent to HD. He denies any dyspnea or CP today. He endorses that he is very hungry but is NPO due to pending EGD. Plan for HD 01/16/24 with no heparin .   Past Medical History:  Diagnosis Date   Arthritis    Hip   CHF (congestive heart failure) (HCC)    Colon cancer (HCC)    Diabetic mononeuropathy associated with type 2 diabetes mellitus (HCC) 03/21/2017   Diabetic retinopathy (HCC) 07/31/2020   Enthesopathy of ankle and tarsus 04/02/2009   Formatting of this note might be different from the original. 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 Type 2 Diabetes Mellitus - Uncomplicated, Uncontrolled   Past Surgical History:  Procedure Laterality Date   AMPUTATION Left 01/22/2018   Procedure: TRANSMETATARSAL AMPUTATION;  Surgeon: Harden Jerona GAILS, MD;  Location: [facility] OR;  Service: Orthopedics;  Laterality: Left;toes   BILIARY STENT PLACEMENT N/A 05/10/2023   Procedure: BILIARY STENT PLACEMENT;  Surgeon: Rollin Dover, MD;  Location: [facility] ENDOSCOPY;  Service: Gastroenterology;  Laterality: N/A;   BILIARY STENT PLACEMENT N/A 05/18/2023   Procedure: BILIARY STENT PLACEMENT;  Surgeon: Rollin Dover, MD;  Location: [facility];  Service: Gastroenterology;  Laterality: N/A;   BILIARY STENT PLACEMENT  01/02/2024   Procedure: INSERTION, STENT, BILE DUCT;  Surgeon: Rollin Dover, MD;  Location: [facility] ENDOSCOPY;  Service: Gastroenterology;;   BIOPSY  07/27/2020   Procedure: BIOPSY;  Surgeon: Rollin Dover, MD;  Location: [facility]OSCOPY;  Service: Endoscopy;;   COLON RESECTION N/A 07/30/2020   Procedure: HAND ASSISTED LAPAROSCOPIC LEFT HEMI COLECTOMY;  Surgeon: Gladis Cough, MD;  Location: [facility] ORS;  Service: General;  Laterality: N/A;   COLONOSCOPY WITH PROPOFOL  N/A 05/24/2018   Procedure: COLONOSCOPY WITH PROPOFOL ;  Surgeon: Rollin Dover, MD;  Location: [facility];  Service: Endoscopy;  Laterality: N/A;   COLONOSCOPY WITH PROPOFOL  N/A 07/27/2020   Procedure: COLONOSCOPY WITH PROPOFOL ;  Surgeon: Rollin Dover,  MD;  Location: [facility];  Service: Endoscopy;  Laterality: N/A;   DIALYSIS/PERMA CATHETER INSERTION N/A 11/19/2023   Procedure: DIALYSIS/PERMA CATHETER INSERTION;  Surgeon: [name] [name], MD;  Location: [facility];  Service: Cardiovascular;  Laterality: N/A;   ERCP N/A 05/10/2023   Procedure:  ENDOSCOPIC RETROGRADE CHOLANGIOPANCREATOGRAPHY (ERCP);  Surgeon: Rollin Dover, MD;  Location: [facility];  Service: Gastroenterology;  Laterality: N/A;   ERCP N/A 05/18/2023   Procedure: ENDOSCOPIC RETROGRADE CHOLANGIOPANCREATOGRAPHY (ERCP);  Surgeon: Rollin Dover, MD;  Location: [facility];  Service: Gastroenterology;  Laterality: N/A;   ERCP N/A 01/02/2024   Procedure: ERCP, WITH INTERVENTION IF INDICATED;  Surgeon: Rollin Dover, MD;  Location: [facility] ENDOSCOPY;  Service: Gastroenterology;  Laterality: N/A;   ESOPHAGOGASTRODUODENOSCOPY Left 04/05/2021   Procedure: ESOPHAGOGASTRODUODENOSCOPY (EGD);  Surgeon: Rollin Dover, MD;  Location: [facility];  Service: Endoscopy;  Laterality: Left;   ESOPHAGOGASTRODUODENOSCOPY N/A 05/10/2023   Procedure: ESOPHAGOGASTRODUODENOSCOPY (EGD);  Surgeon: Rollin Dover, MD;  Location: [facility];  Service: Gastroenterology;  Laterality: N/A;   EUS N/A 05/10/2023   Procedure: UPPER ENDOSCOPIC ULTRASOUND (EUS) LINEAR;  Surgeon: Rollin Dover, MD;  Location: [facility];  Service: Gastroenterology;  Laterality: N/A;   FINE NEEDLE ASPIRATION N/A 05/10/2023   Procedure: FINE NEEDLE ASPIRATION (FNA) LINEAR;  Surgeon: Rollin Dover, MD;  Location: [facility];  Service: Gastroenterology;  Laterality: N/A;   INSERTION OF ARTERIOVENOUS (AV) ARTEGRAFT ARM Right 12/06/2023   Procedure: INSERTION, GRAFT, ARTERIOVENOUS, UPPER EXTREMITY;  Surgeon: [name] [name], MD;  Location: [facility] OR;  Service: Vascular;  Laterality: Right;   INSERTION OF DIALYSIS CATHETER Right 12/06/2023   Procedure: EXCHANGE OF DIALYSIS CATHETER USING PALINDROME 23CM CATHETER KIT;  Surgeon: [name] [name], MD;  Location: [facility] OR;  Service: Vascular;  Laterality: Right;   POLYPECTOMY  05/24/2018   Procedure: POLYPECTOMY;  Surgeon: Rollin Dover, MD;  Location: [facility];  Service: Endoscopy;;   PORTACATH PLACEMENT Left 08/24/2020   Procedure: INSERTION PORT-A-CATH;  Surgeon: Gladis Cough, MD;   Location: [facility];  Service: General;  Laterality: Left;  75/rm1   SPHINCTEROTOMY  05/10/2023   Procedure: ANNETT;  Surgeon: Rollin Dover, MD;  Location: [facility];  Service: Gastroenterology;;   ANNETT  05/18/2023   Procedure: ANNETT;  Surgeon: Rollin Dover, MD;  Location: [facility];  Service: Gastroenterology;;   CLEDA REMOVAL  05/18/2023   Procedure: STENT REMOVAL;  Surgeon: Rollin Dover, MD;  Location: [facility];  Service: Gastroenterology;;   CLEDA REMOVAL  01/02/2024   Procedure: STENT REMOVAL;  Surgeon: Rollin Dover, MD;  Location[facility] ENDOSCOPY;  Service: Gastroenterology;;   SUBMUCOSAL TATTOO INJECTION  07/27/2020   Procedure: SUBMUCOSAL TATTOO INJECTION;  Surgeon: Rollin Dover, MD;  Location: [facility] ENDOSCOPY;  Service: Endoscopy;;   Family History  Problem Relation Age of Onset   Hypertension Father    Social History:  reports that he has never smoked. He has never used smokeless tobacco. He reports current alcohol use of about 1.0 standard drink of alcohol per week. He reports that he does not use drugs.  ROS: As per HPI otherwise negative.   Physical Exam: Vitals:   01/15/24 0513 01/15/24 0530 01/15/24 0715 01/15/24 0925  BP: (!) 107/45 (!) 117/54 (!) 120/50 139/78  Pulse: 78 75 77 81  Resp: 18 18 17  (!) 21  Temp: 98.3 F (36.8 C) 98.3 F (36.8 C) 98 F (36.7 C) 99 F (37.2 C)  TempSrc: Oral Oral Oral Oral  SpO2: 96% 98% 98% 95%  Weight:  Height:         GENERAL:  Lethargic, pleasant, no acute distress  HEENT:  EOMI CARDIOVASCULAR:  RRR, no murmurs appreciated RESPIRATORY:  Clear to auscultation, no wheezing, rales, or rhonchi GASTROINTESTINAL:  Soft, nontender, nondistended EXTREMITIES:  No LE edema bilaterally NEURO:  No new focal deficits appreciated SKIN:  No rashes noted PSYCH:  Appropriate mood and affect Dialysis Access: [facility] with maturing AVG ready to use 01/17/24  Allergies  Allergen Reactions   Bee Venom  Anaphylaxis, Swelling and Other (See Comments)    Cold Sweats, also   Latex Rash and Dermatitis   Prior to Admission medications   Medication Sig Start Date End Date Taking? Authorizing Provider  amLODipine  (NORVASC ) 10 MG tablet Take 10 mg by mouth daily.   Yes [provider]  baclofen  (LIORESAL ) 10 MG tablet Take 10 mg by mouth daily as needed for muscle spasms.   Yes [provider]  cadexomer iodine  (IODOSORB) 0.9 % gel Apply 1 Application topically daily as needed for wound care.   Yes [provider]  carvedilol  (COREG ) 3.125 MG tablet Take 1 tablet (3.125 mg total) by mouth 2 (two) times daily with a meal. 01/03/24  Yes Dennise Lavada POUR, MD  EPINEPHrine  0.3 mg/0.3 mL IJ SOAJ injection Inject 0.3 mg into the muscle as needed for anaphylaxis.   Yes [provider]  ergocalciferol (VITAMIN D2) 1.25 MG (50000 UT) capsule Take 50,000 Units by mouth once a week. Take on Monday   Yes [provider]  ferrous sulfate 325 (65 FE) MG EC tablet Take 325 mg by mouth daily with breakfast.   Yes [provider]  furosemide  (LASIX ) 20 MG tablet Take 20 mg by mouth.   Yes [provider]  LANTUS  SOLOSTAR 100 UNIT/ML Solostar Pen Inject 10-15 Units into the skin See admin instructions. Inject 15 units into the skin in the morning and 10 units into the skin at bedtime   Yes [provider]  loperamide (IMODIUM) 2 MG capsule Take 2 mg by mouth daily as needed for diarrhea or loose stools.   Yes [provider]  losartan (COZAAR) 50 MG tablet Take 50 mg by mouth daily. 01/11/24  Yes [provider]  mirtazapine  (REMERON ) 15 MG tablet Take 7.5 mg by mouth at bedtime. 12/26/23  Yes [provider]  Nutritional Supplements (FEEDING SUPPLEMENT, NEPRO CARB STEADY,) LIQD Take 237 mLs by mouth daily. Mixed berry   Yes [provider]  Sodium Chloride , GU Irrigant, (0.9 % SODIUM CHLORIDE , POUR BTL, OPTIME)  Irrigate with 1,000 mLs as directed daily.   Yes [provider]   Current Facility-Administered Medications  Medication Dose Route Frequency Provider Last Rate Last Admin   0.9 %  sodium chloride  infusion (Manually program via Guardrails IV Fluids)   Intravenous Once Opyd, Timothy S, MD       acetaminophen  (TYLENOL ) tablet 650 mg  650 mg Oral Q6H PRN Opyd, Timothy S, MD   650 mg at 01/15/24 0158   Or   acetaminophen  (TYLENOL ) suppository 650 mg  650 mg Rectal Q6H PRN Opyd, Timothy S, MD       calcitRIOL  (ROCALTROL ) capsule 0.25 mcg  0.25 mcg Oral Daily Opyd, Timothy S, MD       fentaNYL  (SUBLIMAZE ) injection 12.5-50 mcg  12.5-50 mcg Intravenous Q2H PRN Opyd, Timothy S, MD       insulin  aspart (novoLOG ) injection 0-6 Units  0-6 Units Subcutaneous Q4H Opyd, Evalene RAMAN, MD  mirtazapine  (REMERON ) tablet 7.5 mg  7.5 mg Oral Daily Opyd, Timothy S, MD       oxyCODONE  (Oxy IR/ROXICODONE ) immediate release tablet 5 mg  5 mg Oral Q4H PRN Opyd, Timothy S, MD       pantoprazole  (PROTONIX ) injection 40 mg  40 mg Intravenous Q12H Opyd, Timothy S, MD   40 mg at 01/15/24 9056   prochlorperazine  (COMPAZINE ) injection 5 mg  5 mg Intravenous Q4H PRN Opyd, Timothy S, MD       sodium chloride  flush (NS) 0.9 % injection 3 mL  3 mL Intravenous Q12H Opyd, Timothy S, MD   3 mL at 01/15/24 9056   Current Outpatient Medications  Medication Sig Dispense Refill   amLODipine  (NORVASC ) 10 MG tablet Take 10 mg by mouth daily.     baclofen  (LIORESAL ) 10 MG tablet Take 10 mg by mouth daily as needed for muscle spasms.     cadexomer iodine  (IODOSORB) 0.9 % gel Apply 1 Application topically daily as needed for wound care.     carvedilol  (COREG ) 3.125 MG tablet Take 1 tablet (3.125 mg total) by mouth 2 (two) times daily with a meal. 60 tablet 0   EPINEPHrine  0.3 mg/0.3 mL IJ SOAJ injection Inject 0.3 mg into the muscle as needed for anaphylaxis.     ergocalciferol (VITAMIN D2) 1.25 MG (50000 UT) capsule Take 50,000  Units by mouth once a week. Take on Monday     ferrous sulfate 325 (65 FE) MG EC tablet Take 325 mg by mouth daily with breakfast.     furosemide  (LASIX ) 20 MG tablet Take 20 mg by mouth.     LANTUS  SOLOSTAR 100 UNIT/ML Solostar Pen Inject 10-15 Units into the skin See admin instructions. Inject 15 units into the skin in the morning and 10 units into the skin at bedtime     loperamide (IMODIUM) 2 MG capsule Take 2 mg by mouth daily as needed for diarrhea or loose stools.     losartan (COZAAR) 50 MG tablet Take 50 mg by mouth daily.     mirtazapine  (REMERON ) 15 MG tablet Take 7.5 mg by mouth at bedtime.     Nutritional Supplements (FEEDING SUPPLEMENT, NEPRO CARB STEADY,) LIQD Take 237 mLs by mouth daily. Mixed berry     Sodium Chloride , GU Irrigant, (0.9 % SODIUM CHLORIDE , POUR BTL, OPTIME) Irrigate with 1,000 mLs as directed daily.     Facility-Administered Medications Ordered in Other Encounters  Medication Dose Route Frequency Provider Last Rate Last Admin   sodium chloride  flush (NS) 0.9 % injection 10 mL  10 mL Intracatheter PRN Cloretta Arley NOVAK, MD   10 mL at 11/17/21 1153   Labs: Basic Metabolic Panel: Recent Labs  Lab 01/10/24 0800 01/14/24 1740 01/15/24 0403  NA 131* 132* 130*  K 3.4* 3.4* 3.1*  CL 91* 92* 93*  CO2 28 29 27   GLUCOSE 179* 135* 145*  BUN 11 9 14   CREATININE 3.13* 2.33* 3.32*  CALCIUM  8.6* 7.9* 7.8*   Liver Function Tests: Recent Labs  Lab 01/10/24 0800 01/14/24 1740 01/15/24 0403  AST 44* 39 40  ALT 42 33 30  ALKPHOS 453* 242* 200*  BILITOT 2.4* 2.6* 2.2*  PROT 6.7 6.2* 5.4*  ALBUMIN  2.8* 1.7* 1.5*   No results for input(s): LIPASE, AMYLASE in the last 168 hours. No results for input(s): AMMONIA in the last 168 hours. CBC: Recent Labs  Lab 01/10/24 0800 01/14/24 1740 01/15/24 0403  WBC 11.8* 18.2* 12.9*  NEUTROABS  9.3*  --   --   HGB 8.0* 7.6* 6.6*  HCT 23.5* 23.3* 20.4*  MCV 92.5 90.7 92.3  PLT 274 199 171    CBG: Recent Labs   Lab 01/14/24 2103 01/15/24 0142 01/15/24 0442 01/15/24 0946  GLUCAP 135* 117* 144* 139*   [facility]  MWF [facility] 74 kg - reported that he is losing weight and has poor appetite 4 hours 3K/2.5 Ca bath 400/600 No esa due to malignancy Hectorol 1 mcg  Assessment/Plan:  Upper GI bleed: patient had episode of hematemesis post HD on 01/14/24. Hgb dropped to 6.6 and he was given 1 unit of PRBC in ED. Planned EGD later today. Of note - patient just hospitalized for similar episode on 8/12-8/14 and had stent placed during ERCP   ESRD:  On HD at [facility] clinic. I called to get his HD orders. Using [facility] and has R AVG that is maturing and will be ready for use 01/17/24. Next HD 01/16/24 HFpEF: does not appear to be volume overloaded Hyperbilirubinemia: elevated on presentation but showing mild improvement. CT noted increase in intrahepatic vile duct dilation compared from CT 10/13/23. Appears to be second to pancreatic mass or lymphadenopathy. Had stent replaced 01/02/24 by Dr. Rollin.   Hypertension/volume: BP acceptable today. Reported that he has been losing weight from his home unit. He has had his EDW lowered multiple times. He was recently placed on Mirtazapine . Patient appears euvolemic on exam.   Anemia: Not on ESA due to malignancy. He has received 1 U PRBC so far with this hospitalization. Last Hgb 6.6. No venofer due to high ferritin level.  Metabolic bone disease: On hectorol 1 mcg. No calcitriol . Corrected Ca 9.8.   Nutrition:  Albumin  1.5. Starting protein supplements when he is cleared for diet.  DM: on SSI per primary. Patient is currently NPO.  Belvie Och, NP 01/15/2024, 11:14 AM  [facility]

## 2024-01-15 NOTE — Progress Notes (Signed)
 The patient is well-known to me.  He is recently s/p stent exchange.  During the ERCP he was noted to have significant friability in his oral cavity.  There was also a clot noted with the plastic biliary stents before extraction of the stents.  Some bleeding was noted from the biliary tract.  The patient was admitted for anemia and hematemesis.  A repeat EGD will be performed for further evaluation.

## 2024-01-16 ENCOUNTER — Inpatient Hospital Stay

## 2024-01-16 ENCOUNTER — Observation Stay (HOSPITAL_COMMUNITY)

## 2024-01-16 ENCOUNTER — Inpatient Hospital Stay: Admitting: Nurse Practitioner

## 2024-01-16 ENCOUNTER — Encounter (HOSPITAL_COMMUNITY): Payer: Self-pay | Admitting: Gastroenterology

## 2024-01-16 ENCOUNTER — Inpatient Hospital Stay (HOSPITAL_COMMUNITY)

## 2024-01-16 DIAGNOSIS — C779 Secondary and unspecified malignant neoplasm of lymph node, unspecified: Secondary | ICD-10-CM | POA: Diagnosis present

## 2024-01-16 DIAGNOSIS — Z91040 Latex allergy status: Secondary | ICD-10-CM | POA: Diagnosis not present

## 2024-01-16 DIAGNOSIS — C186 Malignant neoplasm of descending colon: Secondary | ICD-10-CM | POA: Diagnosis present

## 2024-01-16 DIAGNOSIS — C7889 Secondary malignant neoplasm of other digestive organs: Secondary | ICD-10-CM | POA: Diagnosis present

## 2024-01-16 DIAGNOSIS — E11319 Type 2 diabetes mellitus with unspecified diabetic retinopathy without macular edema: Secondary | ICD-10-CM | POA: Diagnosis present

## 2024-01-16 DIAGNOSIS — Z515 Encounter for palliative care: Secondary | ICD-10-CM | POA: Diagnosis not present

## 2024-01-16 DIAGNOSIS — Z8249 Family history of ischemic heart disease and other diseases of the circulatory system: Secondary | ICD-10-CM | POA: Diagnosis not present

## 2024-01-16 DIAGNOSIS — E1159 Type 2 diabetes mellitus with other circulatory complications: Secondary | ICD-10-CM | POA: Diagnosis present

## 2024-01-16 DIAGNOSIS — Z91030 Bee allergy status: Secondary | ICD-10-CM | POA: Diagnosis not present

## 2024-01-16 DIAGNOSIS — N2581 Secondary hyperparathyroidism of renal origin: Secondary | ICD-10-CM | POA: Diagnosis present

## 2024-01-16 DIAGNOSIS — Z7189 Other specified counseling: Secondary | ICD-10-CM | POA: Diagnosis not present

## 2024-01-16 DIAGNOSIS — E871 Hypo-osmolality and hyponatremia: Secondary | ICD-10-CM | POA: Diagnosis present

## 2024-01-16 DIAGNOSIS — Z794 Long term (current) use of insulin: Secondary | ICD-10-CM | POA: Diagnosis not present

## 2024-01-16 DIAGNOSIS — E876 Hypokalemia: Secondary | ICD-10-CM | POA: Diagnosis present

## 2024-01-16 DIAGNOSIS — Z992 Dependence on renal dialysis: Secondary | ICD-10-CM | POA: Diagnosis not present

## 2024-01-16 DIAGNOSIS — I5042 Chronic combined systolic (congestive) and diastolic (congestive) heart failure: Secondary | ICD-10-CM | POA: Diagnosis present

## 2024-01-16 DIAGNOSIS — K920 Hematemesis: Secondary | ICD-10-CM | POA: Diagnosis present

## 2024-01-16 DIAGNOSIS — Z89432 Acquired absence of left foot: Secondary | ICD-10-CM | POA: Diagnosis not present

## 2024-01-16 DIAGNOSIS — E1122 Type 2 diabetes mellitus with diabetic chronic kidney disease: Secondary | ICD-10-CM | POA: Diagnosis present

## 2024-01-16 DIAGNOSIS — E1141 Type 2 diabetes mellitus with diabetic mononeuropathy: Secondary | ICD-10-CM | POA: Diagnosis present

## 2024-01-16 DIAGNOSIS — N186 End stage renal disease: Secondary | ICD-10-CM | POA: Diagnosis present

## 2024-01-16 DIAGNOSIS — C799 Secondary malignant neoplasm of unspecified site: Secondary | ICD-10-CM | POA: Diagnosis not present

## 2024-01-16 DIAGNOSIS — D62 Acute posthemorrhagic anemia: Secondary | ICD-10-CM | POA: Diagnosis not present

## 2024-01-16 DIAGNOSIS — I132 Hypertensive heart and chronic kidney disease with heart failure and with stage 5 chronic kidney disease, or end stage renal disease: Secondary | ICD-10-CM | POA: Diagnosis present

## 2024-01-16 DIAGNOSIS — I152 Hypertension secondary to endocrine disorders: Secondary | ICD-10-CM | POA: Diagnosis present

## 2024-01-16 DIAGNOSIS — K922 Gastrointestinal hemorrhage, unspecified: Secondary | ICD-10-CM | POA: Diagnosis not present

## 2024-01-16 DIAGNOSIS — R7881 Bacteremia: Secondary | ICD-10-CM | POA: Diagnosis not present

## 2024-01-16 DIAGNOSIS — Z8616 Personal history of COVID-19: Secondary | ICD-10-CM | POA: Diagnosis not present

## 2024-01-16 DIAGNOSIS — E785 Hyperlipidemia, unspecified: Secondary | ICD-10-CM | POA: Diagnosis present

## 2024-01-16 DIAGNOSIS — C189 Malignant neoplasm of colon, unspecified: Secondary | ICD-10-CM | POA: Diagnosis not present

## 2024-01-16 LAB — CBC WITH DIFFERENTIAL/PLATELET
Abs Immature Granulocytes: 0.19 K/uL — ABNORMAL HIGH (ref 0.00–0.07)
Basophils Absolute: 0 K/uL (ref 0.0–0.1)
Basophils Relative: 0 %
Eosinophils Absolute: 0 K/uL (ref 0.0–0.5)
Eosinophils Relative: 0 %
HCT: 24.8 % — ABNORMAL LOW (ref 39.0–52.0)
Hemoglobin: 8.5 g/dL — ABNORMAL LOW (ref 13.0–17.0)
Immature Granulocytes: 1 %
Lymphocytes Relative: 6 %
Lymphs Abs: 0.8 K/uL (ref 0.7–4.0)
MCH: 30 pg (ref 26.0–34.0)
MCHC: 34.3 g/dL (ref 30.0–36.0)
MCV: 87.6 fL (ref 80.0–100.0)
Monocytes Absolute: 1.5 K/uL — ABNORMAL HIGH (ref 0.1–1.0)
Monocytes Relative: 11 %
Neutro Abs: 11.3 K/uL — ABNORMAL HIGH (ref 1.7–7.7)
Neutrophils Relative %: 82 %
Platelets: 165 K/uL (ref 150–400)
RBC: 2.83 MIL/uL — ABNORMAL LOW (ref 4.22–5.81)
RDW: 16 % — ABNORMAL HIGH (ref 11.5–15.5)
WBC: 13.8 K/uL — ABNORMAL HIGH (ref 4.0–10.5)
nRBC: 0 % (ref 0.0–0.2)

## 2024-01-16 LAB — BPAM RBC
Blood Product Expiration Date: 202509202359
ISSUE DATE / TIME: 202508260503
Unit Type and Rh: 6200

## 2024-01-16 LAB — TYPE AND SCREEN
ABO/RH(D): A POS
Antibody Screen: NEGATIVE
Unit division: 0

## 2024-01-16 LAB — COMPREHENSIVE METABOLIC PANEL WITH GFR
ALT: 30 U/L (ref 0–44)
AST: 39 U/L (ref 15–41)
Albumin: 1.6 g/dL — ABNORMAL LOW (ref 3.5–5.0)
Alkaline Phosphatase: 211 U/L — ABNORMAL HIGH (ref 38–126)
Anion gap: 8 (ref 5–15)
BUN: 21 mg/dL — ABNORMAL HIGH (ref 6–20)
CO2: 28 mmol/L (ref 22–32)
Calcium: 7.8 mg/dL — ABNORMAL LOW (ref 8.9–10.3)
Chloride: 92 mmol/L — ABNORMAL LOW (ref 98–111)
Creatinine, Ser: 4.48 mg/dL — ABNORMAL HIGH (ref 0.61–1.24)
GFR, Estimated: 14 mL/min — ABNORMAL LOW (ref >60)
Glucose, Bld: 237 mg/dL — ABNORMAL HIGH (ref 70–99)
Potassium: 2.9 mmol/L — ABNORMAL LOW (ref 3.5–5.1)
Sodium: 128 mmol/L — ABNORMAL LOW (ref 135–145)
Total Bilirubin: 2.1 mg/dL — ABNORMAL HIGH (ref 0.0–1.2)
Total Protein: 5.5 g/dL — ABNORMAL LOW (ref 6.5–8.1)

## 2024-01-16 LAB — GLUCOSE, CAPILLARY
Glucose-Capillary: 239 mg/dL — ABNORMAL HIGH (ref 70–99)
Glucose-Capillary: 140 mg/dL — ABNORMAL HIGH (ref 70–99)
Glucose-Capillary: 138 mg/dL — ABNORMAL HIGH (ref 70–99)
Glucose-Capillary: 103 mg/dL — ABNORMAL HIGH (ref 70–99)
Glucose-Capillary: 118 mg/dL — ABNORMAL HIGH (ref 70–99)

## 2024-01-16 LAB — SURGICAL PATHOLOGY

## 2024-01-16 MED ORDER — HEPARIN SODIUM (PORCINE) 1000 UNIT/ML IJ SOLN
INTRAMUSCULAR | Status: AC
Start: 2024-01-16 — End: 2024-01-16
  Filled 2024-01-16: qty 4

## 2024-01-16 MED ORDER — SODIUM CHLORIDE 0.9 % IV SOLN
1.5000 g | Freq: Two times a day (BID) | INTRAVENOUS | Status: DC
  Administered 2024-01-16 – 2024-01-17 (×3): 1.5 g via INTRAVENOUS
  Filled 2024-01-16 (×3): qty 4

## 2024-01-16 NOTE — Progress Notes (Signed)
 Received patient in bed.Alert and oriented x 4.[employer] verified.  Access used: right hd catheter that worked well.Dressing change done today.  Duration of treatment:  3 hours.  Uf goal : Met 2 L, he tolerated treatment.  Hand off to the patient's nurse,back into his room with stable condition via transporter.

## 2024-01-16 NOTE — Progress Notes (Signed)
 Jeffrey Young is an 58 y.o. 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 Admitted due to hematemesis. On admission his hgb was 6.6. He was given 1 unit of PRBC. EGD 01/15/2024   Dialysis Orders: [facility] MWF EDW 74 kg - reported that he is losing weight and has poor appetite 4 hours 3K/2.5 Ca bath 400/600 No ESA due to malignancy Hectorol 1 mcg  Assessment/Plan:  Upper GI bleed: patient had episode of hematemesis post HD on 01/14/24. Hgb dropped to 6.6 and he was given 1 unit of PRBC in ED.  Patient just hospitalized for similar episode on 8/12-8/14 and had stent placed during ERCP. EGD 01/15/2024 showed a gastric ulcer with adherent clot, reflux esophagitis but no active bleed.   ESRD:  On HD at [facility] clinic.  Using [facility] and has R AVG that is maturing and will be ready for use 01/17/24 (surgical date on 12/06/2023).   On next HD 01/16/24  HFpEF: does not appear to be volume overloaded Hyperbilirubinemia: elevated on presentation but showing mild improvement. CT noted increase in intrahepatic vile duct dilation compared from CT 10/13/23. Appears to be second to pancreatic mass or lymphadenopathy. Had stent replaced 01/02/24 by Dr. Rollin.   Hypertension/volume: BP fairly well-controlled today. Reported that he has been losing weight from his home unit. He has had his EDW lowered multiple times. He was recently placed on Mirtazapine . Patient appears euvolemic on exam.   Anemia: Not on ESA due to malignancy. He has received 1 U PRBC so far with this hospitalization. Last Hgb 6.6 -> 9.2 -> 8.5. No venofer due to high ferritin level.  Metabolic bone disease: On hectorol 1 mcg. No calcitriol . Corrected Ca 9.8.   Nutrition:  Albumin  1.5. Starting protein  supplements when he is cleared for diet.  DM: on SSI per primary. Patient is currently NPO.   Subjective: Lethargic but easily arousable, breathing comfortably.  Patient denies any nausea vomiting shortness of breath chest pain fever chills.   Chemistry and CBC: Creatinine  Date/Time Value Ref Range Status  01/10/2024 08:00 AM 3.13 (H) 0.61 - 1.24 mg/dL Final  92/98/7974 88:74 AM 6.41 (H) 0.61 - 1.24 mg/dL Final  93/81/7974 89:94 AM 6.79 (H) 0.61 - 1.24 mg/dL Final  94/80/7974 91:89 AM 5.73 (H) 0.61 - 1.24 mg/dL Final  95/77/7974 90:76 AM 6.66 (H) 0.61 - 1.24 mg/dL Final  95/89/7974 91:79 AM 6.11 (H) 0.61 - 1.24 mg/dL Final  97/82/7974 98:88 PM 5.59 (H) 0.61 - 1.24 mg/dL Final  98/78/7974 89:68 AM 4.48 (H) 0.61 - 1.24 mg/dL Final  98/93/7974 88:56 AM 5.42 (H) 0.61 - 1.24 mg/dL Final  93/82/7975 91:85 AM 3.53 (H) 0.61 - 1.24 mg/dL Final  95/98/7975 88:86 AM 3.11 (H) 0.61 - 1.24 mg/dL Final  97/83/7975 91:91 AM 3.17 (H) 0.61 - 1.24 mg/dL Final  87/70/7976 89:59 AM 2.94 (H) 0.61 - 1.24 mg/dL Final  88/86/7976 89:93 AM 3.01 (HH) 0.61 - 1.24 mg/dL Final    Comment:    CRITICAL RESULT CALLED TO, READ BACK BY AND VERIFIED WITH: CT SUSAN COWARD @ 1134. 04/03/2022.klj   11/07/2021 08:31 AM 2.83 (H) 0.61 - 1.24 mg/dL Final  89/89/7977 87:87 PM 1.72 (H) 0.61 - 1.24 mg/dL Final  90/72/7977 91:73 AM 1.82 (H) 0.61 - 1.24 mg/dL Final  01/31/2021 08:43 AM 1.34 (H) 0.61 - 1.24 mg/dL Final  91/70/7977 91:84 AM 1.54 (H) 0.61 - 1.24 mg/dL Final  91/84/7977 90:89 AM 1.34 (H) 0.61 - 1.24 mg/dL Final  91/98/7977 87:69 PM 1.75 (H) 0.61 - 1.24 mg/dL Final  92/81/7977 91:69 AM 1.36 (H) 0.61 - 1.24 mg/dL Final  92/94/7977 91:80 AM 1.58 (H) 0.61 - 1.24 mg/dL Final  93/79/7977 91:79 AM 1.77 (H) 0.61 - 1.24 mg/dL Final  93/86/7977 91:99 AM 1.64 (H) 0.61 - 1.24 mg/dL Final  93/93/7977 91:71 AM 1.79 (H) 0.61 - 1.24 mg/dL Final  94/76/7977 91:50 AM 2.00 (H) 0.61 - 1.24 mg/dL Final  94/90/7977 91:66 AM 2.10  (H) 0.61 - 1.24 mg/dL Final  95/74/7977 91:99 AM 2.21 (H) 0.61 - 1.24 mg/dL Final  95/88/7977 90:72 AM 2.60 (H) 0.61 - 1.24 mg/dL Final  96/71/7977 92:48 AM 2.01 (H) 0.61 - 1.24 mg/dL Final   Creatinine, Ser  Date/Time Value Ref Range Status  01/16/2024 05:32 AM 4.48 (H) 0.61 - 1.24 mg/dL Final  91/73/7974 95:96 AM 3.32 (H) 0.61 - 1.24 mg/dL Final  91/74/7974 94:59 PM 2.33 (H) 0.61 - 1.24 mg/dL Final  91/86/7974 95:50 AM 4.38 (H) 0.61 - 1.24 mg/dL Final  91/87/7974 97:48 AM 3.01 (H) 0.61 - 1.24 mg/dL Final  91/88/7974 93:65 PM 1.90 (H) 0.61 - 1.24 mg/dL Final  91/88/7974 93:93 PM 2.09 (H) 0.61 - 1.24 mg/dL Final  92/82/7974 90:44 AM 3.70 (H) 0.61 - 1.24 mg/dL Final  87/68/7975 94:99 AM 5.06 (H) 0.61 - 1.24 mg/dL Final  87/69/7975 97:43 AM 5.98 (H) 0.61 - 1.24 mg/dL Final  87/70/7975 96:92 AM 5.89 (H) 0.61 - 1.24 mg/dL Final  87/71/7975 96:59 AM 5.36 (H) 0.61 - 1.24 mg/dL Final  87/72/7975 87:60 PM 5.70 (H) 0.61 - 1.24 mg/dL Final  87/72/7975 96:80 AM 4.46 (H) 0.61 - 1.24 mg/dL Final  87/73/7975 97:43 AM 4.37 (H) 0.61 - 1.24 mg/dL Final  87/74/7975 96:83 AM 4.37 (H) 0.61 - 1.24 mg/dL Final  87/75/7975 98:87 AM 4.30 (H) 0.61 - 1.24 mg/dL Final  87/76/7975 97:82 AM 4.23 (H) 0.61 - 1.24 mg/dL Final  87/77/7975 95:83 AM 4.71 (H) 0.61 - 1.24 mg/dL Final  87/78/7975 96:95 AM 4.84 (H) 0.61 - 1.24 mg/dL Final  87/79/7975 94:92 PM 4.72 (H) 0.61 - 1.24 mg/dL Final  87/79/7975 96:81 AM 3.37 (H) 0.61 - 1.24 mg/dL Final  87/80/7975 95:93 AM 4.14 (H) 0.61 - 1.24 mg/dL Final  87/81/7975 96:80 AM 4.18 (H) 0.61 - 1.24 mg/dL Final  87/82/7975 95:89 AM 4.75 (H) 0.61 - 1.24 mg/dL Final  87/83/7975 89:76 AM 5.02 (H) 0.61 - 1.24 mg/dL Final  88/76/7977 96:54 AM 1.56 (H) 0.61 - 1.24 mg/dL Final  88/77/7977 95:74 AM 1.76 (H) 0.61 - 1.24 mg/dL Final  88/78/7977 96:99 AM 1.55 (H) 0.61 - 1.24 mg/dL Final  88/79/7977 96:46 AM 1.43 (H) 0.61 - 1.24 mg/dL Final  88/80/7977 94:66 AM 1.39 (H) 0.61 - 1.24 mg/dL  Final  88/81/7977 96:61 AM 1.68 (H) 0.61 - 1.24 mg/dL Final  88/82/7977 96:68 AM 1.57 (H) 0.61 - 1.24 mg/dL Final  88/83/7977 94:57 AM 1.52 (H) 0.61 - 1.24 mg/dL Final  88/84/7977 96:40 PM 1.73 (H) 0.61 - 1.24 mg/dL Final  88/85/7977 95:66 AM 1.80 (H) 0.61 - 1.24 mg/dL Final  88/86/7977 95:63 AM 1.92 (H) 0.61 - 1.24 mg/dL Final  88/87/7977 94:41 AM 2.09 (H) 0.61 - 1.24 mg/dL Final  88/88/7977 90:69 PM 2.18 (H) 0.61 - 1.24 mg/dL Final  95/69/7977 92:52 PM 2.20 (H)  0.61 - 1.24 mg/dL Final  96/84/7977 89:57 AM 2.13 (H) 0.61 - 1.24 mg/dL Final  96/85/7977 95:44 AM 2.30 (H) 0.61 - 1.24 mg/dL Final  96/86/7977 94:63 AM 2.55 (H) 0.61 - 1.24 mg/dL Final  96/87/7977 87:78 PM 2.53 (H) 0.61 - 1.24 mg/dL Final  96/87/7977 94:83 AM 2.41 (H) 0.61 - 1.24 mg/dL Final  96/88/7977 95:46 AM 1.81 (H) 0.61 - 1.24 mg/dL Final  96/89/7977 94:64 AM 2.12 (H) 0.61 - 1.24 mg/dL Final  96/90/7977 94:94 AM 1.85 (H) 0.61 - 1.24 mg/dL Final  96/91/7977 93:98 AM 1.65 (H) 0.61 - 1.24 mg/dL Final  96/92/7977 94:85 AM 1.95 (H) 0.61 - 1.24 mg/dL Final  96/93/7977 89:55 AM 1.57 (H) 0.61 - 1.24 mg/dL Final  96/94/7977 90:74 PM 1.64 (H) 0.61 - 1.24 mg/dL Final   Recent Labs  Lab 01/10/24 0800 01/14/24 1740 01/15/24 0403 01/16/24 0532  NA 131* 132* 130* 128*  K 3.4* 3.4* 3.1* 2.9*  CL 91* 92* 93* 92*  CO2 28 29 27 28   GLUCOSE 179* 135* 145* 237*  BUN 11 9 14  21*  CREATININE 3.13* 2.33* 3.32* 4.48*  CALCIUM  8.6* 7.9* 7.8* 7.8*   Recent Labs  Lab 01/10/24 0800 01/14/24 1740 01/15/24 0403 01/15/24 1306 01/16/24 0532  WBC 11.8* 18.2* 12.9* 15.9* 13.8*  NEUTROABS 9.3*  --   --   --  11.3*  HGB 8.0* 7.6* 6.6* 9.2*  9.3* 8.5*  HCT 23.5* 23.3* 20.4* 27.4*  27.8* 24.8*  MCV 92.5 90.7 92.3 89.3 87.6  PLT 274 199 171 190 165   Liver Function Tests: Recent Labs  Lab 01/14/24 1740 01/15/24 0403 01/16/24 0532  AST 39 40 39  ALT 33 30 30  ALKPHOS 242* 200* 211*  BILITOT 2.6* 2.2* 2.1*  PROT 6.2* 5.4* 5.5*   ALBUMIN  1.7* 1.5* 1.6*   No results for input(s): LIPASE, AMYLASE in the last 168 hours. No results for input(s): AMMONIA in the last 168 hours. Cardiac Enzymes: No results for input(s): CKTOTAL, CKMB, CKMBINDEX, TROPONINI in the last 168 hours. Iron Studies: No results for input(s): IRON, TIBC, TRANSFERRIN, FERRITIN in the last 72 hours. PT/INR: @LABRCNTIP (inr:5)  Xrays/Other Studies: ) Results for orders placed or performed during the hospital encounter of 01/14/24 (from the past 48 hours)  Type and screen [facility]     Status: None (Preliminary result)   Collection Time: 01/14/24  5:34 PM  Result Value Ref Range   ABO/RH(D) A POS    Antibody Screen NEG    Sample Expiration 01/17/2024,2359    Unit Number [id number]    Blood Component Type RED CELLS,LR    Unit division 00    Status of Unit ISSUED    Transfusion Status OK TO TRANSFUSE    Crossmatch Result      Compatible Performed at [facility], [address], [place], [place] [address]   Comprehensive metabolic panel     Status: Abnormal   Collection Time: 01/14/24  5:40 PM  Result Value Ref Range   Sodium 132 (L) 135 - 145 mmol/L   Potassium 3.4 (L) 3.5 - 5.1 mmol/L   Chloride 92 (L) 98 - 111 mmol/L   CO2 29 22 - 32 mmol/L   Glucose, Bld 135 (H) 70 - 99 mg/dL    Comment: Glucose reference range applies only to samples taken after fasting for at least 8 hours.   BUN 9 6 - 20 mg/dL   Creatinine, Ser 7.66 (H) 0.61 - 1.24 mg/dL  Calcium  7.9 (L) 8.9 - 10.3 mg/dL   Total Protein 6.2 (L) 6.5 - 8.1 g/dL   Albumin  1.7 (L) 3.5 - 5.0 g/dL   AST 39 15 - 41 U/L   ALT 33 0 - 44 U/L   Alkaline Phosphatase 242 (H) 38 - 126 U/L   Total Bilirubin 2.6 (H) 0.0 - 1.2 mg/dL   GFR, Estimated 31 (L) >60 mL/min    Comment: (NOTE) Calculated using the CKD-EPI Creatinine Equation (2021)    Anion gap 11 5 - 15    Comment: Performed at [facility], [address]  [place], [place] [address]  CBC     Status: Abnormal   Collection Time: 01/14/24  5:40 PM  Result Value Ref Range   WBC 18.2 (H) 4.0 - 10.5 K/uL   RBC 2.57 (L) 4.22 - 5.81 MIL/uL   Hemoglobin 7.6 (L) 13.0 - 17.0 g/dL   HCT 76.6 (L) 60.9 - 47.9 %   MCV 90.7 80.0 - 100.0 fL   MCH 29.6 26.0 - 34.0 pg   MCHC 32.6 30.0 - 36.0 g/dL   RDW 83.4 (H) 88.4 - 84.4 %   Platelets 199 150 - 400 K/uL   nRBC 0.0 0.0 - 0.2 %    Comment: Performed at [facility], [address], [place], [place] [address]  CBG monitoring, ED     Status: Abnormal   Collection Time: 01/14/24  9:03 PM  Result Value Ref Range   Glucose-Capillary 135 (H) 70 - 99 mg/dL    Comment: Glucose reference range applies only to samples taken after fasting for at least 8 hours.  CBG monitoring, ED     Status: Abnormal   Collection Time: 01/15/24  1:42 AM  Result Value Ref Range   Glucose-Capillary 117 (H) 70 - 99 mg/dL    Comment: Glucose reference range applies only to samples taken after fasting for at least 8 hours.  Basic metabolic panel     Status: Abnormal   Collection Time: 01/15/24  4:03 AM  Result Value Ref Range   Sodium 130 (L) 135 - 145 mmol/L   Potassium 3.1 (L) 3.5 - 5.1 mmol/L   Chloride 93 (L) 98 - 111 mmol/L   CO2 27 22 - 32 mmol/L   Glucose, Bld 145 (H) 70 - 99 mg/dL    Comment: Glucose reference range applies only to samples taken after fasting for at least 8 hours.   BUN 14 6 - 20 mg/dL   Creatinine, Ser 6.67 (H) 0.61 - 1.24 mg/dL   Calcium  7.8 (L) 8.9 - 10.3 mg/dL   GFR, Estimated 20 (L) >60 mL/min    Comment: (NOTE) Calculated using the CKD-EPI Creatinine Equation (2021)    Anion gap 10 5 - 15    Comment: Performed at [facility], [address] [place], [place] [address]  Hepatic function panel     Status: Abnormal   Collection Time: 01/15/24  4:03 AM  Result Value Ref Range   Total Protein 5.4 (L) 6.5 - 8.1 g/dL   Albumin  1.5 (L) 3.5 - 5.0 g/dL   AST 40 15 - 41 U/L   ALT 30 0 - 44 U/L    Alkaline Phosphatase 200 (H) 38 - 126 U/L   Total Bilirubin 2.2 (H) 0.0 - 1.2 mg/dL   Bilirubin, Direct 0.8 (H) 0.0 - 0.2 mg/dL   Indirect Bilirubin 1.4 (H) 0.3 - 0.9 mg/dL    Comment: Performed at [facility], [address], [place], [place] [address]  CBC     Status: Abnormal   Collection Time: 01/15/24  4:03 AM  Result Value Ref Range   WBC 12.9 (H) 4.0 - 10.5 K/uL   RBC 2.21 (L) 4.22 - 5.81 MIL/uL   Hemoglobin 6.6 (LL) 13.0 - 17.0 g/dL    Comment: REPEATED TO VERIFY This critical result has been called to J.MURCHISON RN by Gaetana Reilly on 01/15/2024 04:24:10, and has been read back.    HCT 20.4 (L) 39.0 - 52.0 %   MCV 92.3 80.0 - 100.0 fL   MCH 29.9 26.0 - 34.0 pg   MCHC 32.4 30.0 - 36.0 g/dL   RDW 83.2 (H) 88.4 - 84.4 %   Platelets 171 150 - 400 K/uL   nRBC 0.0 0.0 - 0.2 %    Comment: Performed at [facility], [address] [place], [place] [address]  CBG monitoring, ED     Status: Abnormal   Collection Time: 01/15/24  4:42 AM  Result Value Ref Range   Glucose-Capillary 144 (H) 70 - 99 mg/dL    Comment: Glucose reference range applies only to samples taken after fasting for at least 8 hours.  Prepare RBC (crossmatch)     Status: None   Collection Time: 01/15/24  4:46 AM  Result Value Ref Range   Order Confirmation      ORDER PROCESSED BY BLOOD BANK Performed at [facility], [address] [place], [place] [address]   CBG monitoring, ED     Status: Abnormal   Collection Time: 01/15/24  9:46 AM  Result Value Ref Range   Glucose-Capillary 139 (H) 70 - 99 mg/dL    Comment: Glucose reference range applies only to samples taken after fasting for at least 8 hours.  CBC     Status: Abnormal   Collection Time: 01/15/24  1:06 PM  Result Value Ref Range   WBC 15.9 (H) 4.0 - 10.5 K/uL   RBC 3.07 (L) 4.22 - 5.81 MIL/uL   Hemoglobin 9.2 (L) 13.0 - 17.0 g/dL    Comment: REPEATED TO VERIFY   HCT 27.4 (L) 39.0 - 52.0 %    Comment: POST TRANSFUSION  SPECIMEN   MCV 89.3 80.0 - 100.0 fL   MCH 30.0 26.0 - 34.0 pg   MCHC 33.6 30.0 - 36.0 g/dL   RDW 83.4 (H) 88.4 - 84.4 %   Platelets 190 150 - 400 K/uL   nRBC 0.0 0.0 - 0.2 %    Comment: Performed at [facility], [address], [place], [place] [address]  Hemoglobin and hematocrit, blood     Status: Abnormal   Collection Time: 01/15/24  1:06 PM  Result Value Ref Range   Hemoglobin 9.3 (L) 13.0 - 17.0 g/dL    Comment: REPEATED TO VERIFY POST TRANSFUSION SPECIMEN    HCT 27.8 (L) 39.0 - 52.0 %    Comment: Performed at [facility], [address] [place], [place] [address]  Glucose, capillary     Status: Abnormal   Collection Time: 01/15/24  1:37 PM  Result Value Ref Range   Glucose-Capillary 151 (H) 70 - 99 mg/dL    Comment: Glucose reference range applies only to samples taken after fasting for at least 8 hours.  Glucose, capillary     Status: Abnormal   Collection Time: 01/15/24  4:01 PM  Result Value Ref Range   Glucose-Capillary 155 (H) 70 -  99 mg/dL    Comment: Glucose reference range applies only to samples taken after fasting for at least 8 hours.  Glucose, capillary     Status: Abnormal   Collection Time: 01/15/24  8:59 PM  Result Value Ref Range   Glucose-Capillary 146 (H) 70 - 99 mg/dL    Comment: Glucose reference range applies only to samples taken after fasting for at least 8 hours.  Comprehensive metabolic panel with GFR     Status: Abnormal   Collection Time: 01/16/24  5:32 AM  Result Value Ref Range   Sodium 128 (L) 135 - 145 mmol/L   Potassium 2.9 (L) 3.5 - 5.1 mmol/L   Chloride 92 (L) 98 - 111 mmol/L   CO2 28 22 - 32 mmol/L   Glucose, Bld 237 (H) 70 - 99 mg/dL    Comment: Glucose reference range applies only to samples taken after fasting for at least 8 hours.   BUN 21 (H) 6 - 20 mg/dL   Creatinine, Ser 5.51 (H) 0.61 - 1.24 mg/dL   Calcium  7.8 (L) 8.9 - 10.3 mg/dL   Total Protein 5.5 (L) 6.5 - 8.1 g/dL   Albumin  1.6 (L) 3.5 - 5.0 g/dL   AST  39 15 - 41 U/L   ALT 30 0 - 44 U/L   Alkaline Phosphatase 211 (H) 38 - 126 U/L   Total Bilirubin 2.1 (H) 0.0 - 1.2 mg/dL   GFR, Estimated 14 (L) >60 mL/min    Comment: (NOTE) Calculated using the CKD-EPI Creatinine Equation (2021)    Anion gap 8 5 - 15    Comment: Performed at [facility], [address] [place], [place] [address]  CBC with Differential/Platelet     Status: Abnormal   Collection Time: 01/16/24  5:32 AM  Result Value Ref Range   WBC 13.8 (H) 4.0 - 10.5 K/uL   RBC 2.83 (L) 4.22 - 5.81 MIL/uL   Hemoglobin 8.5 (L) 13.0 - 17.0 g/dL   HCT 75.1 (L) 60.9 - 47.9 %   MCV 87.6 80.0 - 100.0 fL   MCH 30.0 26.0 - 34.0 pg   MCHC 34.3 30.0 - 36.0 g/dL   RDW 83.9 (H) 88.4 - 84.4 %   Platelets 165 150 - 400 K/uL   nRBC 0.0 0.0 - 0.2 %   Neutrophils Relative % 82 %   Neutro Abs 11.3 (H) 1.7 - 7.7 K/uL   Lymphocytes Relative 6 %   Lymphs Abs 0.8 0.7 - 4.0 K/uL   Monocytes Relative 11 %   Monocytes Absolute 1.5 (H) 0.1 - 1.0 K/uL   Eosinophils Relative 0 %   Eosinophils Absolute 0.0 0.0 - 0.5 K/uL   Basophils Relative 0 %   Basophils Absolute 0.0 0.0 - 0.1 K/uL   Immature Granulocytes 1 %   Abs Immature Granulocytes 0.19 (H) 0.00 - 0.07 K/uL    Comment: Performed at [facility], [address], [place], [place] [address]   DG Chest Port 1 View Result Date: 01/16/2024 EXAM: 1 VIEW XRAY OF THE CHEST 01/16/2024 06:11:00 AM COMPARISON: 12/06/2023 CLINICAL HISTORY: SOB (shortness of breath) [id number]. Reason for exam: SOB FINDINGS: LUNGS AND PLEURA: Moderate right and small left pleural effusions with associated bibasilar opacities. Mild pulmonary edema. HEART AND MEDIASTINUM: Mild cardiomegaly. BONES AND SOFT TISSUES: No acute osseous abnormality. LINES AND TUBES: Right IJ CVC in place with tip overlying proximal right atrium. Left chest port in place with catheter tip overlying right atrium. IMPRESSION:  1. Moderate right and small left pleural effusions with associated  bibasilar opacities. 2. Mild pulmonary edema. 3. Mild cardiomegaly. Electronically signed by: Waddell Calk MD 01/16/2024 06:26 AM EDT RP Workstation: [id number]    PMH:   Past Medical History:  Diagnosis Date   Arthritis    Hip   CHF (congestive heart failure) (HCC)    Colon cancer (HCC)    Diabetic mononeuropathy associated with type 2 diabetes mellitus (HCC) 03/21/2017   Diabetic retinopathy (HCC) 07/31/2020   Enthesopathy of ankle and tarsus 04/02/2009   Formatting of this note might be different from the original. 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 Type 2 Diabetes Mellitus - Uncomplicated, Uncontrolled    PSH:   Past Surgical History:  Procedure Laterality Date   AMPUTATION Left 01/22/2018   Procedure: TRANSMETATARSAL AMPUTATION;  Surgeon: Harden Jerona GAILS, MD;  Location: [facility] OR;  Service: Orthopedics;  Laterality: Left;toes   BILIARY STENT PLACEMENT N/A 05/10/2023   Procedure: BILIARY STENT PLACEMENT;  Surgeon: Rollin Dover, MD;  Location: [facility] ENDOSCOPY;  Service: Gastroenterology;  Laterality: N/A;   BILIARY STENT PLACEMENT N/A 05/18/2023   Procedure: BILIARY STENT PLACEMENT;  Surgeon: Rollin Dover, MD;  Location: [facility];  Service: Gastroenterology;  Laterality: N/A;    BILIARY STENT PLACEMENT  01/02/2024   Procedure: INSERTION, STENT, BILE DUCT;  Surgeon: Rollin Dover, MD;  Location: [facility] ENDOSCOPY;  Service: Gastroenterology;;   BIOPSY  07/27/2020   Procedure: BIOPSY;  Surgeon: Rollin Dover, MD;  Location: [facility] ENDOSCOPY;  Service: Endoscopy;;   COLON RESECTION N/A 07/30/2020   Procedure: HAND ASSISTED LAPAROSCOPIC LEFT HEMI COLECTOMY;  Surgeon: Gladis Cough, MD;  Location: [facility] ORS;  Service: General;  Laterality: N/A;   COLONOSCOPY WITH PROPOFOL  N/A 05/24/2018   Procedure: COLONOSCOPY WITH PROPOFOL ;  Surgeon: Rollin Dover, MD;  Location: [facility];  Service: Endoscopy;  Laterality: N/A;   COLONOSCOPY WITH PROPOFOL  N/A 07/27/2020   Procedure: COLONOSCOPY WITH PROPOFOL ;  Surgeon: Rollin Dover, MD;  Location: [facility];  Service: Endoscopy;  Laterality: N/A;   DIALYSIS/PERMA CATHETER INSERTION N/A 11/19/2023   Procedure: DIALYSIS/PERMA CATHETER INSERTION;  Surgeon: [name] [name], MD;  Location: [facility];  Service: Cardiovascular;  Laterality: N/A;   ERCP N/A 05/10/2023   Procedure: ENDOSCOPIC RETROGRADE CHOLANGIOPANCREATOGRAPHY (ERCP);  Surgeon: Rollin Dover, MD;  Location: [facility];  Service: Gastroenterology;  Laterality: N/A;   ERCP N/A 05/18/2023   Procedure: ENDOSCOPIC RETROGRADE CHOLANGIOPANCREATOGRAPHY (ERCP);  Surgeon: Rollin Dover, MD;  Location: [facility];  Service: Gastroenterology;  Laterality: N/A;   ERCP N/A 01/02/2024   Procedure: ERCP, WITH INTERVENTION IF INDICATED;  Surgeon: Rollin Dover, MD;  Location: [facility] ENDOSCOPY;  Service: Gastroenterology;  Laterality: N/A;   ESOPHAGOGASTRODUODENOSCOPY Left 04/05/2021   Procedure: ESOPHAGOGASTRODUODENOSCOPY (EGD);  Surgeon: Rollin Dover, MD;  Location: [facility];  Service: Endoscopy;  Laterality:  Left;   ESOPHAGOGASTRODUODENOSCOPY N/A 05/10/2023   Procedure: ESOPHAGOGASTRODUODENOSCOPY (EGD);  Surgeon: Rollin Dover, MD;  Location: [facility];  Service: Gastroenterology;  Laterality:  N/A;   EUS N/A 05/10/2023   Procedure: UPPER ENDOSCOPIC ULTRASOUND (EUS) LINEAR;  Surgeon: Rollin Dover, MD;  Location: [facility];  Service: Gastroenterology;  Laterality: N/A;   FINE NEEDLE ASPIRATION N/A 05/10/2023   Procedure: FINE NEEDLE ASPIRATION (FNA) LINEAR;  Surgeon: Rollin Dover, MD;  Location: [facility];  Service: Gastroenterology;  Laterality: N/A;   INSERTION OF ARTERIOVENOUS (AV) ARTEGRAFT ARM Right 12/06/2023   Procedure: INSERTION, GRAFT, ARTERIOVENOUS, UPPER EXTREMITY;  Surgeon: [name] [name], MD;  Location: [facility] OR;  Service: Vascular;  Laterality: Right;   INSERTION OF DIALYSIS CATHETER Right 12/06/2023   Procedure: EXCHANGE OF DIALYSIS CATHETER USING PALINDROME 23CM CATHETER KIT;  Surgeon: [name] [name], MD;  Location: [facility] OR;  Service: Vascular;  Laterality: Right;   POLYPECTOMY  05/24/2018   Procedure: POLYPECTOMY;  Surgeon: Rollin Dover, MD;  Location: [facility];  Service: Endoscopy;;   PORTACATH PLACEMENT Left 08/24/2020   Procedure: INSERTION PORT-A-CATH;  Surgeon: Gladis Cough, MD;  Location: [facility];  Service: General;  Laterality: Left;  75/rm1   SPHINCTEROTOMY  05/10/2023   Procedure: ANNETT;  Surgeon: Rollin Dover, MD;  Location: [facility];  Service: Gastroenterology;;   ANNETT  05/18/2023   Procedure: ANNETT;  Surgeon: Rollin Dover, MD;  Location: [facility];  Service: Gastroenterology;;   CLEDA REMOVAL  05/18/2023   Procedure: STENT REMOVAL;  Surgeon: Rollin Dover, MD;  Location: [facility];  Service: Gastroenterology;;   CLEDA REMOVAL  01/02/2024   Procedure: STENT REMOVAL;  Surgeon: Rollin Dover, MD;  Location: [facility] ENDOSCOPY;  Service: Gastroenterology;;   SUBMUCOSAL TATTOO INJECTION  07/27/2020   Procedure: SUBMUCOSAL TATTOO INJECTION;  Surgeon: Rollin Dover, MD;  Location: [facility];  Service: Endoscopy;;    Allergies:  Allergies  Allergen Reactions   Bee Venom Anaphylaxis, Swelling and Other (See Comments)     Cold Sweats, also   Latex Rash and Dermatitis    Medications:   Prior to Admission medications   Medication Sig Start Date End Date Taking? Authorizing Provider  amLODipine  (NORVASC ) 10 MG tablet Take 10 mg by mouth daily.   Yes [provider]  baclofen  (LIORESAL ) 10 MG tablet Take 10 mg by mouth daily as needed for muscle spasms.   Yes [provider]  cadexomer iodine  (IODOSORB) 0.9 % gel Apply 1 Application topically daily as needed for wound care.   Yes [provider]  carvedilol  (COREG ) 3.125 MG tablet Take 1 tablet (3.125 mg total) by mouth 2 (two) times daily with a meal. 01/03/24  Yes Dennise Lavada POUR, MD  EPINEPHrine  0.3 mg/0.3 mL IJ SOAJ injection Inject 0.3 mg into the muscle as needed for anaphylaxis.   Yes [provider]  ergocalciferol (VITAMIN D2) 1.25 MG (50000 UT) capsule Take 50,000 Units by mouth once a week. Take on Monday   Yes [provider]  ferrous sulfate 325 (65 FE) MG EC tablet Take 325 mg by mouth daily with breakfast.   Yes [provider]  furosemide  (LASIX ) 20 MG tablet Take 20 mg by mouth.   Yes [provider]  LANTUS  SOLOSTAR 100 UNIT/ML Solostar Pen Inject 10-15 Units into the skin See admin instructions. Inject 15 units into the skin in the morning and 10 units into the skin at bedtime   Yes [provider]  loperamide (IMODIUM) 2 MG capsule Take 2 mg  by mouth daily as needed for diarrhea or loose stools.   Yes [provider]  losartan (COZAAR) 50 MG tablet Take 50 mg by mouth daily. 01/11/24  Yes [provider]  mirtazapine  (REMERON ) 15 MG tablet Take 7.5 mg by mouth at bedtime. 12/26/23  Yes [provider]  Nutritional Supplements (FEEDING SUPPLEMENT, NEPRO CARB STEADY,) LIQD Take 237 mLs by mouth daily. Mixed berry   Yes [provider]  Sodium Chloride , GU Irrigant, (0.9 % SODIUM CHLORIDE , POUR BTL, OPTIME) Irrigate with 1,000 mLs as directed  daily.   Yes [provider]    Discontinued Meds:   Medications Discontinued During This Encounter  Medication Reason   ACCU-CHEK GUIDE test strip    Continuous Glucose Sensor (DEXCOM G6 SENSOR) MISC    Continuous Glucose Transmitter (DEXCOM G6 TRANSMITTER) MISC    oxyCODONE  (ROXICODONE ) 5 MG immediate release tablet Completed Course   pantoprazole  (PROTONIX ) 40 MG tablet Completed Course   prochlorperazine  (COMPAZINE ) 10 MG tablet Change in therapy   calcitRIOL  (ROCALTROL ) 0.25 MCG capsule Change in therapy   0.9 %  sodium chloride  infusion Patient Transfer   insulin  aspart (novoLOG ) injection 0-6 Units     Social History:  reports that he has never smoked. He has never used smokeless tobacco. He reports current alcohol use of about 1.0 standard drink of alcohol per week. He reports that he does not use drugs.  Family History:   Family History  Problem Relation Age of Onset   Hypertension Father     Blood pressure 125/62, pulse 87, temperature 99 F (37.2 C), temperature source Oral, resp. rate 18, height 6' 1 (1.854 m), weight 74 kg, SpO2 95%. Physical Exam: GENERAL:  Lethargic but easily arousable, pleasant, NAD  HEENT:  EOMI CARDIOVASCULAR:  RRR, no murmurs appreciated RESPIRATORY:  Clear to auscultation GASTROINTESTINAL:  Soft, nontender, nondistended EXTREMITIES:  No LE edema bilaterally NEURO:  No new focal deficits appreciated SKIN:  No rashes noted PSYCH:  Appropriate mood and affect Dialysis access: TDC, right upper arm AV graft with a very nice thrill ready to be used on 01/17/2024 (placed 12/06/2023)     MELIA LYNWOOD ORN, MD 01/16/2024, 7:33 AM

## 2024-01-16 NOTE — Progress Notes (Signed)
 SLP Cancellation Note  Patient Details Name: Jeffrey Young MRN: [id number] DOB: [birth year]   Cancelled treatment:       Reason Eval/Treat Not Completed: Patient at procedure or test/unavailable (HD). SLP will f/u as able.    Damien Blumenthal, M.A., CCC-SLP Speech Language Pathology, Acute Rehabilitation Services  Secure Chat preferred [phone]  01/16/2024, 11:02 AM

## 2024-01-16 NOTE — Progress Notes (Signed)
 Subjective: Patient denies any complaints today.  HPI.  He is resting comfortably in bed.  EGD revealed an LA grade D esophagitis with a 2 cm hiatal hernia and a gastric ulcer with an adherent clot.  He is on IV PPIs.  He has had some fevers for the last 24 hours likely from aspiration with a KUB done today was essentially unrevealing with slight rotation of the stent but the stent seem to be in place.  A chest x-ray revealed mild pulmonary edema with mild cardiomegaly and moderate right and small left fluid effusions with associated bibasilar opacities. Patient denies having abdominal pain nausea vomiting.  He had a bowel movement earlier today.  Objective: Vital signs in last 24 hours: Temp:  [98.1 F (36.7 C)-102.1 F (38.9 C)] 99.2 F (37.3 C) (08/27 1210) Pulse Rate:  [75-92] 89 (08/27 1210) Resp:  [10-25] 24 (08/27 1210) BP: (100-157)/(55-93) 134/58 (08/27 1210) SpO2:  [92 %-100 %] 100 % (08/27 1210) Weight:  [73.3 kg-75.8 kg] 73.3 kg (08/27 1210) Last BM Date : 01/13/24  Intake/Output from previous day: 08/26 0701 - 08/27 0700 In: 293 [P.O.:240; I.V.:53] Out: 600 [Urine:600] Intake/Output this shift: Total I/O In: -  Out: 2000 [Other:2000]  General appearance: cooperative, appears stated age, fatigued, no distress, and pale Resp: clear to auscultation bilaterally Cardio: regular rate and rhythm, S1, S2 normal, no murmur, click, rub or gallop GI: soft, non-tender; bowel sounds normal; no masses,  no organomegaly  Lab Results: Recent Labs    01/15/24 0403 01/15/24 1306 01/16/24 0532  WBC 12.9* 15.9* 13.8*  HGB 6.6* 9.2*  9.3* 8.5*  HCT 20.4* 27.4*  27.8* 24.8*  PLT 171 190 165   BMET Recent Labs    01/14/24 1740 01/15/24 0403 01/16/24 0532  NA 132* 130* 128*  K 3.4* 3.1* 2.9*  CL 92* 93* 92*  CO2 29 27 28   GLUCOSE 135* 145* 237*  BUN 9 14 21*  CREATININE 2.33* 3.32* 4.48*  CALCIUM  7.9* 7.8* 7.8*   LFT Recent Labs    01/15/24 0403 01/16/24 0532  PROT  5.4* 5.5*  ALBUMIN  1.5* 1.6*  AST 40 39  ALT 30 30  ALKPHOS 200* 211*  BILITOT 2.2* 2.1*  BILIDIR 0.8*  --   IBILI 1.4*  --    PT/INR No results for input(s): LABPROT, INR in the last 72 hours. Hepatitis Panel No results for input(s): HEPBSAG, HCVAB, HEPAIGM, HEPBIGM in the last 72 hours. C-Diff No results for input(s): CDIFFTOX in the last 72 hours. No results for input(s): CDIFFPCR in the last 72 hours. Fecal Lactopherrin No results for input(s): FECLLACTOFRN in the last 72 hours.  Studies/Results: DG Chest Port 1 View Result Date: 01/16/2024 EXAM: 1 VIEW XRAY OF THE CHEST 01/16/2024 06:11:00 AM COMPARISON: 12/06/2023 CLINICAL HISTORY: SOB (shortness of breath) 141880. Reason for exam: SOB FINDINGS: LUNGS AND PLEURA: Moderate right and small left pleural effusions with associated bibasilar opacities. Mild pulmonary edema. HEART AND MEDIASTINUM: Mild cardiomegaly. BONES AND SOFT TISSUES: No acute osseous abnormality. LINES AND TUBES: Right IJ CVC in place with tip overlying proximal right atrium. Left chest port in place with catheter tip overlying right atrium. IMPRESSION: 1. Moderate right and small left pleural effusions with associated bibasilar opacities. 2. Mild pulmonary edema. 3. Mild cardiomegaly. Electronically signed by: Waddell Calk MD 01/16/2024 06:26 AM EDT RP Workstation: [id number]    Medications: I have reviewed the patient's current medications. Prior to Admission:  Medications Prior to Admission  Medication Sig Dispense Refill  Last Dose/Taking   amLODipine  (NORVASC ) 10 MG tablet Take 10 mg by mouth daily.   01/14/2024 Morning   baclofen  (LIORESAL ) 10 MG tablet Take 10 mg by mouth daily as needed for muscle spasms.   01/12/2024   cadexomer iodine  (IODOSORB) 0.9 % gel Apply 1 Application topically daily as needed for wound care.   01/12/2024   carvedilol  (COREG ) 3.125 MG tablet Take 1 tablet (3.125 mg total) by mouth 2 (two) times daily with a meal. 60  tablet 0 01/14/2024 Morning   EPINEPHrine  0.3 mg/0.3 mL IJ SOAJ injection Inject 0.3 mg into the muscle as needed for anaphylaxis.   Unknown   ergocalciferol (VITAMIN D2) 1.25 MG (50000 UT) capsule Take 50,000 Units by mouth once a week. Take on Monday   01/07/2024   ferrous sulfate 325 (65 FE) MG EC tablet Take 325 mg by mouth daily with breakfast.   Unknown   furosemide  (LASIX ) 20 MG tablet Take 20 mg by mouth.   Unknown   LANTUS  SOLOSTAR 100 UNIT/ML Solostar Pen Inject 10-15 Units into the skin See admin instructions. Inject 15 units into the skin in the morning and 10 units into the skin at bedtime   01/13/2024   loperamide (IMODIUM) 2 MG capsule Take 2 mg by mouth daily as needed for diarrhea or loose stools.   01/12/2024   losartan (COZAAR) 50 MG tablet Take 50 mg by mouth daily.   01/14/2024 Morning   mirtazapine  (REMERON ) 15 MG tablet Take 7.5 mg by mouth at bedtime.   01/11/2024   Nutritional Supplements (FEEDING SUPPLEMENT, NEPRO CARB STEADY,) LIQD Take 237 mLs by mouth daily. Mixed berry   01/12/2024   Sodium Chloride , GU Irrigant, (0.9 % SODIUM CHLORIDE , POUR BTL, OPTIME) Irrigate with 1,000 mLs as directed daily.   01/12/2024   Scheduled:  sodium chloride    Intravenous Once   calcitRIOL   0.25 mcg Oral Daily   Chlorhexidine  Gluconate Cloth  6 each Topical Q0600   insulin  aspart  0-6 Units Subcutaneous TID WC   mirtazapine   7.5 mg Oral Daily   pantoprazole  (PROTONIX ) IV  40 mg Intravenous Q12H   sodium chloride  flush  3 mL Intravenous Q12H   Continuous:  ampicillin -sulbactam (UNASYN ) IV       Assessment/Plan: 1) acute blood loss anemia, hematemesis-LA grade D esophagitis with a gastric ulcer and a 2 cm hiatal hernia noted on EGD currently stable.  2) Fever of unclear etiology blood cultures pending.-He may have aspirated during the nausea and vomiting chest x-ray revealed bilateral basilar opacities with mild cardiomegaly and mild pulmonary edema-on Unasyn . 3) ESRD-on HD M-W-F. 4)  History of metastatic colon cancer. 5) HTN. 6) AODM. 7) Agree with palliative care consult.    LOS: 0 days   Renaye Sous 01/16/2024, 1:55 PM

## 2024-01-16 NOTE — Plan of Care (Signed)

## 2024-01-16 NOTE — Progress Notes (Signed)
 PROGRESS NOTE                                                                                                                                                                                                             Patient Demographics:    Jeffrey Young, is a 58 y.o. male, DOB - 09-Jan-1966, FMW:[id number]  Outpatient Primary MD for the patient is [facility]    LOS - 0  Admit date - 01/14/2024    Chief Complaint  Patient presents with   Abnormal Lab       Brief Narrative (HPI from H&P)   58 year old male with history of ESRD (MWF), colon cancer s/p partial colectomy, HFpEF, DM, transmetatarsal amputation left foot, anemia (baseline 7-9), presenting with acute upper GI bleed and worsening anemia.    Subjective:    Jeffrey Young today has, No headache, No chest pain, No abdominal pain - No Nausea, No new weakness tingling or numbness, no SOB   Assessment  & Plan :   Acute blood loss anemia, hematemesis- Acute UGI Bleed, history of stage III colon cancer with metastasis, rebleed, had similar episode few weeks ago.  Has been seen by GI, s/p 1 unit of packed RBC transfusion, underwent EGD on 01/15/2024 showing a gastric ulcer which was nonbleeding but did have adherent clot, continue to monitor CBC, continue PPI.  Have also informed patient's oncology about his admission, will get palliative care for goals of care discussions.   Fever on running for the last 24 hours.  Blood cultures drawn morning of 01/16/2024, likely aspirated when he threw up some blood however he also has biliary stent and could have a GI source, will place him on Unasyn , speech evaluation, aspiration precautions, monitor cultures.    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 Cloretta. -Continue  mirtazapine    Hypertension Blood pressure normal off medications - Hold amlodipine , carvedilol , furosemide , losartan until hemodynamics clear   Hypokalemia and hyponatremia - Treat with dialysis, nephrology on board  Diabetes 2 Glucose controlled - Sliding scale corrections   Lab Results  Component Value Date   HGBA1C 5.4 12/31/2023   CBG (last 3)  Recent Labs    01/15/24 1601 01/15/24  2059 01/16/24 0741  GLUCAP 155* 146* 239*           Condition - Extremely Guarded  Family Communication  :  None  Code Status :  Full  Consults  :  GI, Pall care  PUD Prophylaxis : PPI   Procedures  :     EGD  Impression:               - LA Grade D reflux esophagitis with no bleeding.                           - 2 cm hiatal hernia.                           - Non-bleeding gastric ulcer with an adherent clot                            (Forrest Class IIb). Biopsied. Injected.                           - Normal examined duodenum. Recommendation:           - Return patient to hospital ward for ongoing care.                           - Resume regular diet in 2 hours.                           - Continue present medications.                           - Await pathology results.                           - Monitor HGB and transfuse as necessary.                           - PPI QD indefinitely.      Disposition Plan  :    Status is: Observation   DVT Prophylaxis  :    SCDs Start: 01/14/24 2012    Lab Results  Component Value Date   PLT 165 01/16/2024    Diet :  Diet Order             Diet regular Fluid consistency: Thin  Diet effective now                    Inpatient Medications  Scheduled Meds:  sodium chloride    Intravenous Once   calcitRIOL   0.25 mcg Oral Daily   Chlorhexidine  Gluconate Cloth  6 each Topical Q0600   insulin  aspart  0-6 Units Subcutaneous TID WC   mirtazapine   7.5 mg Oral Daily   pantoprazole  (PROTONIX ) IV  40 mg Intravenous Q12H    sodium chloride  flush  3 mL Intravenous Q12H   Continuous Infusions:  anticoagulant sodium citrate      PRN Meds:.acetaminophen  **OR** acetaminophen , alteplase , anticoagulant sodium citrate , fentaNYL  (SUBLIMAZE ) injection, heparin , lidocaine  (PF), lidocaine -prilocaine , oxyCODONE , pentafluoroprop-tetrafluoroeth, prochlorperazine   Antibiotics  :    Anti-infectives (From admission, onward)    None         Objective:  Vitals:   01/16/24 0915 01/16/24 0930 01/16/24 1000 01/16/24 1030  BP: 137/70 129/63 138/69 130/66  Pulse: 77 77 78 80  Resp: (!) 22 (!) 23 11 (!) 21  Temp:      TempSrc:      SpO2: 100% 100% 100% 100%  Weight:      Height:        Wt Readings from Last 3 Encounters:  01/16/24 75.8 kg  01/10/24 73.9 kg  01/02/24 70.6 kg     Intake/Output Summary (Last 24 hours) at 01/16/2024 1037 Last data filed at 01/15/2024 2107 Gross per 24 hour  Intake 293 ml  Output 600 ml  Net -307 ml     Physical Exam  Awake Alert, No new F.N deficits, Normal affect [place].AT,PERRAL Supple Neck, No JVD,   Symmetrical Chest wall movement, Good air movement bilaterally, CTAB RRR,No Gallops,Rubs or new Murmurs,  +ve B.Sounds, Abd Soft, No tenderness,   No Cyanosis, Clubbing or edema        Data Review:    Recent Labs  Lab 01/10/24 0800 01/14/24 1740 01/15/24 0403 01/15/24 1306 01/16/24 0532  WBC 11.8* 18.2* 12.9* 15.9* 13.8*  HGB 8.0* 7.6* 6.6* 9.2*  9.3* 8.5*  HCT 23.5* 23.3* 20.4* 27.4*  27.8* 24.8*  PLT 274 199 171 190 165  MCV 92.5 90.7 92.3 89.3 87.6  MCH 31.5 29.6 29.9 30.0 30.0  MCHC 34.0 32.6 32.4 33.6 34.3  RDW 15.1 16.5* 16.7* 16.5* 16.0*  LYMPHSABS 1.1  --   --   --  0.8  MONOABS 1.2*  --   --   --  1.5*  EOSABS 0.1  --   --   --  0.0  BASOSABS 0.1  --   --   --  0.0    Recent Labs  Lab 01/10/24 0800 01/14/24 1740 01/15/24 0403 01/16/24 0532  NA 131* 132* 130* 128*  K 3.4* 3.4* 3.1* 2.9*  CL 91* 92* 93* 92*  CO2 28 29 27 28   ANIONGAP 12  11 10 8   GLUCOSE 179* 135* 145* 237*  BUN 11 9 14  21*  CREATININE 3.13* 2.33* 3.32* 4.48*  AST 44* 39 40 39  ALT 42 33 30 30  ALKPHOS 453* 242* 200* 211*  BILITOT 2.4* 2.6* 2.2* 2.1*  ALBUMIN  2.8* 1.7* 1.5* 1.6*  CALCIUM  8.6* 7.9* 7.8* 7.8*      Recent Labs  Lab 01/10/24 0800 01/14/24 1740 01/15/24 0403 01/16/24 0532  CALCIUM  8.6* 7.9* 7.8* 7.8*    --------------------------------------------------------------------------------------------------------------- Lab Results  Component Value Date   CHOL 152 07/27/2020   HDL 36 (L) 07/27/2020   LDLCALC 94 07/27/2020   TRIG 109 07/27/2020   CHOLHDL 4.2 07/27/2020    Lab Results  Component Value Date   HGBA1C 5.4 12/31/2023   No results for input(s): TSH, T4TOTAL, FREET4, T3FREE, THYROIDAB in the last 72 hours. No results for input(s): VITAMINB12, FOLATE, FERRITIN, TIBC, IRON, RETICCTPCT in the last 72 hours. ------------------------------------------------------------------------------------------------------------------ Cardiac Enzymes No results for input(s): CKMB, TROPONINI, MYOGLOBIN in the last 168 hours.  Invalid input(s): CK  Micro Results No results found for this or any previous visit (from the past 240 hours).  Radiology Report DG Chest Port 1 View Result Date: 01/16/2024 EXAM: 1 VIEW XRAY OF THE CHEST 01/16/2024 06:11:00 AM COMPARISON: 12/06/2023 CLINICAL HISTORY: SOB (shortness of breath) 141880. Reason for exam: SOB FINDINGS: LUNGS AND PLEURA: Moderate right and small left pleural effusions with associated bibasilar opacities. Mild pulmonary edema. HEART AND MEDIASTINUM:  Mild cardiomegaly. BONES AND SOFT TISSUES: No acute osseous abnormality. LINES AND TUBES: Right IJ CVC in place with tip overlying proximal right atrium. Left chest port in place with catheter tip overlying right atrium. IMPRESSION: 1. Moderate right and small left pleural effusions with associated bibasilar  opacities. 2. Mild pulmonary edema. 3. Mild cardiomegaly. Electronically signed by: Waddell Calk MD 01/16/2024 06:26 AM EDT RP Workstation: [id number]     Signature  -   Lavada Stank M.D on 01/16/2024 at 10:37 AM   -  To page go to [email or web address]

## 2024-01-16 NOTE — Anesthesia Postprocedure Evaluation (Signed)
 Anesthesia Post Note  Patient: Jeffrey Young  Procedure(s) Performed: EGD (ESOPHAGOGASTRODUODENOSCOPY)     Patient location during evaluation: Endoscopy Anesthesia Type: MAC Level of consciousness: awake Pain management: pain level controlled Vital Signs Assessment: post-procedure vital signs reviewed and stable Respiratory status: spontaneous breathing, nonlabored ventilation and respiratory function stable Cardiovascular status: blood pressure returned to baseline and stable Postop Assessment: no apparent nausea or vomiting Anesthetic complications: no   No notable events documented.  Last Vitals:  Vitals:   01/16/24 0345 01/16/24 0527  BP: (!) 143/61 125/62  Pulse: 92 87  Resp: 19 18  Temp: (!) 38.9 C 37.2 C  SpO2: 92% 95%    Last Pain:  Vitals:   01/16/24 0527  TempSrc: Oral  PainSc:                  Bernardino SQUIBB Kiffany Schelling

## 2024-01-16 NOTE — TOC Initial Note (Signed)
 Transition of Care [facility]) - Initial/Assessment Note    Patient Details  Name: Jeffrey Young MRN: [id number] Date of Birth: 12-17-1965  [facility] of [facility]) CM/SW Contact:    Marval Gell, RN Phone Number: 01/16/2024, 12:18 PM  Clinical Narrative:                  Patient admitted from home for low hgb at HD. Lives w wife.  [facility] [place].  Submitted admission through [place] notification portal confirmation [phone].  ICM will continue to follow for DC needs     Barriers to Discharge: Continued Medical Work up   Patient Goals and CMS Choice            Expected Discharge Plan and Services   Discharge Planning Services: CM Consult   Living arrangements for the past 2 months: Single Family Home                                      Prior Living Arrangements/Services Living arrangements for the past 2 months: Single Family Home Lives with:: Spouse                   Activities of Daily Living   ADL Screening (condition at time of admission) Independently performs ADLs?: Yes (appropriate for developmental age) Is the patient deaf or have difficulty hearing?: No Does the patient have difficulty seeing, even when wearing glasses/contacts?: No Does the patient have difficulty concentrating, remembering, or making decisions?: No  Permission Sought/Granted                  Emotional Assessment              Admission diagnosis:  Hematemesis [K92.0] Upper GI bleed [K92.2] End stage renal disease on dialysis (HCC) [N18.6, Z99.2] Metastatic malignant neoplasm, unspecified site [facility]) [C79.9] Patient Active Problem List   Diagnosis Date Noted   Hematemesis 01/14/2024   Acute upper GI bleed 12/31/2023   Hyperbilirubinemia 12/31/2023   End-stage renal disease on hemodialysis (HCC) 12/31/2023   Chronic diastolic CHF (congestive heart failure) (HCC) 12/31/2023   History of essential hypertension 12/31/2023   Iron deficiency  anemia 05/12/2023   Open wound of left foot 05/12/2023   Essential hypertension 05/11/2023   CKD (chronic kidney disease) stage 4, GFR 15-29 ml/min (HCC) 05/08/2023   Obstructive jaundice due to malignant neoplasm (HCC) 05/07/2023   Malignant neoplasm of colon (HCC)    GI bleed 04/02/2021   Severe sepsis (HCC) 04/02/2021   AKI (acute kidney injury) (HCC) 04/02/2021   Acute metabolic encephalopathy 04/02/2021   Hypokalemia 04/02/2021   Encephalopathy    Nausea and vomiting    Coffee ground emesis    Elevated bilirubin    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 07/31/2020   IDA (iron deficiency anemia) from bleeding colon cancer 07/31/2020   Diabetic retinopathy (HCC) 07/31/2020   Hyperlipidemia 07/31/2020   Low back pain 07/31/2020   Shortness of breath 07/31/2020   Systolic heart failure (HCC) 07/31/2020   DM2 (diabetes mellitus, type 2) (HCC) 07/31/2020   ARF (acute renal failure) (HCC) 07/25/2020   Acute on chronic anemia    New onset of congestive heart failure (HCC) 07/24/2020   GIB (gastrointestinal bleeding) 07/24/2020   Hypertension associated with diabetes (HCC) 06/07/2019   Erectile dysfunction associated with type 2 diabetes mellitus (HCC) 05/08/2019   Sepsis (HCC) 01/22/2018  Diabetic ulcer of left foot (HCC) 01/22/2018   Status post transmetatarsal amputation of left foot (HCC) 01/22/2018   Diabetic mononeuropathy associated with type 2 diabetes mellitus (HCC) 03/21/2017   Microalbuminuria due to type 2 diabetes mellitus (HCC) 03/21/2017   Vitamin D deficiency 04/06/2009   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 [place] [address] Phone: [phone] Fax: [phone]     Social Drivers of Health (SDOH) Social History: SDOH Screenings   Food Insecurity: No Food Insecurity (01/15/2024)  Housing: Low Risk  (01/15/2024)  Transportation Needs: No Transportation Needs (01/15/2024)  Utilities: Not At Risk (01/15/2024)  Depression (PHQ2-9): Low Risk  (11/20/2023)  Financial Resource Strain: Low Risk  (05/30/2023)   Received from [facility]  Physical Activity: Inactive (12/20/2020)   Received from [facility]  Social Connections: Unknown (09/30/2021)   Received from [facility]  Stress: No Stress Concern Present (12/20/2020)   Received from [facility]  Tobacco Use: Low Risk  (01/14/2024)   SDOH Interventions:     Readmission Risk Interventions     No data to display

## 2024-01-16 NOTE — Progress Notes (Signed)
 OOF for dialysis

## 2024-01-16 NOTE — Progress Notes (Signed)
 Called phlebotomist for STAT orders.

## 2024-01-17 ENCOUNTER — Encounter: Payer: Self-pay | Admitting: Oncology

## 2024-01-17 ENCOUNTER — Other Ambulatory Visit: Payer: Self-pay

## 2024-01-17 ENCOUNTER — Telehealth: Payer: Self-pay | Admitting: *Deleted

## 2024-01-17 DIAGNOSIS — K920 Hematemesis: Secondary | ICD-10-CM | POA: Diagnosis not present

## 2024-01-17 DIAGNOSIS — C189 Malignant neoplasm of colon, unspecified: Secondary | ICD-10-CM

## 2024-01-17 DIAGNOSIS — Z515 Encounter for palliative care: Secondary | ICD-10-CM

## 2024-01-17 DIAGNOSIS — C799 Secondary malignant neoplasm of unspecified site: Secondary | ICD-10-CM

## 2024-01-17 DIAGNOSIS — Z7189 Other specified counseling: Secondary | ICD-10-CM

## 2024-01-17 DIAGNOSIS — R7881 Bacteremia: Secondary | ICD-10-CM

## 2024-01-17 DIAGNOSIS — K922 Gastrointestinal hemorrhage, unspecified: Secondary | ICD-10-CM

## 2024-01-17 LAB — COMPREHENSIVE METABOLIC PANEL WITH GFR
ALT: 29 U/L (ref 0–44)
AST: 37 U/L (ref 15–41)
Albumin: 1.5 g/dL — ABNORMAL LOW (ref 3.5–5.0)
Alkaline Phosphatase: 194 U/L — ABNORMAL HIGH (ref 38–126)
Anion gap: 10 (ref 5–15)
BUN: 18 mg/dL (ref 6–20)
CO2: 28 mmol/L (ref 22–32)
Calcium: 8 mg/dL — ABNORMAL LOW (ref 8.9–10.3)
Chloride: 95 mmol/L — ABNORMAL LOW (ref 98–111)
Creatinine, Ser: 3.63 mg/dL — ABNORMAL HIGH (ref 0.61–1.24)
GFR, Estimated: 18 mL/min — ABNORMAL LOW (ref >60)
Glucose, Bld: 120 mg/dL — ABNORMAL HIGH (ref 70–99)
Potassium: 3.5 mmol/L (ref 3.5–5.1)
Sodium: 133 mmol/L — ABNORMAL LOW (ref 135–145)
Total Bilirubin: 2.5 mg/dL — ABNORMAL HIGH (ref 0.0–1.2)
Total Protein: 5.6 g/dL — ABNORMAL LOW (ref 6.5–8.1)

## 2024-01-17 LAB — BLOOD CULTURE ID PANEL (REFLEXED) - BCID2
A.calcoaceticus-baumannii: NOT DETECTED
A.calcoaceticus-baumannii: NOT DETECTED
Bacteroides fragilis: NOT DETECTED
Bacteroides fragilis: NOT DETECTED
Candida albicans: NOT DETECTED
Candida albicans: NOT DETECTED
Candida auris: NOT DETECTED
Candida auris: NOT DETECTED
Candida glabrata: NOT DETECTED
Candida glabrata: NOT DETECTED
Candida krusei: NOT DETECTED
Candida krusei: NOT DETECTED
Candida parapsilosis: NOT DETECTED
Candida parapsilosis: NOT DETECTED
Candida tropicalis: NOT DETECTED
Candida tropicalis: NOT DETECTED
Cryptococcus neoformans/gattii: NOT DETECTED
Cryptococcus neoformans/gattii: NOT DETECTED
Enterobacter cloacae complex: NOT DETECTED
Enterobacter cloacae complex: NOT DETECTED
Enterobacterales: NOT DETECTED
Enterobacterales: NOT DETECTED
Enterococcus Faecium: NOT DETECTED
Enterococcus Faecium: NOT DETECTED
Enterococcus faecalis: NOT DETECTED
Enterococcus faecalis: NOT DETECTED
Escherichia coli: NOT DETECTED
Escherichia coli: NOT DETECTED
Haemophilus influenzae: NOT DETECTED
Haemophilus influenzae: NOT DETECTED
Klebsiella aerogenes: NOT DETECTED
Klebsiella aerogenes: NOT DETECTED
Klebsiella oxytoca: NOT DETECTED
Klebsiella oxytoca: NOT DETECTED
Klebsiella pneumoniae: NOT DETECTED
Klebsiella pneumoniae: NOT DETECTED
Listeria monocytogenes: NOT DETECTED
Listeria monocytogenes: NOT DETECTED
Methicillin resistance mecA/C: DETECTED — AB
Neisseria meningitidis: NOT DETECTED
Neisseria meningitidis: NOT DETECTED
Proteus species: NOT DETECTED
Proteus species: NOT DETECTED
Pseudomonas aeruginosa: NOT DETECTED
Pseudomonas aeruginosa: NOT DETECTED
Salmonella species: NOT DETECTED
Salmonella species: NOT DETECTED
Serratia marcescens: NOT DETECTED
Serratia marcescens: NOT DETECTED
Staphylococcus aureus (BCID): NOT DETECTED
Staphylococcus aureus (BCID): NOT DETECTED
Staphylococcus epidermidis: DETECTED — AB
Staphylococcus epidermidis: NOT DETECTED
Staphylococcus lugdunensis: NOT DETECTED
Staphylococcus lugdunensis: NOT DETECTED
Staphylococcus species: DETECTED — AB
Staphylococcus species: NOT DETECTED
Stenotrophomonas maltophilia: NOT DETECTED
Stenotrophomonas maltophilia: NOT DETECTED
Streptococcus agalactiae: NOT DETECTED
Streptococcus agalactiae: NOT DETECTED
Streptococcus pneumoniae: NOT DETECTED
Streptococcus pneumoniae: NOT DETECTED
Streptococcus pyogenes: NOT DETECTED
Streptococcus pyogenes: NOT DETECTED
Streptococcus species: NOT DETECTED
Streptococcus species: DETECTED — AB

## 2024-01-17 LAB — CBC WITH DIFFERENTIAL/PLATELET
Abs Immature Granulocytes: 0.18 K/uL — ABNORMAL HIGH (ref 0.00–0.07)
Basophils Absolute: 0.1 K/uL (ref 0.0–0.1)
Basophils Relative: 0 %
Eosinophils Absolute: 0.1 K/uL (ref 0.0–0.5)
Eosinophils Relative: 1 %
HCT: 26.6 % — ABNORMAL LOW (ref 39.0–52.0)
Hemoglobin: 8.8 g/dL — ABNORMAL LOW (ref 13.0–17.0)
Immature Granulocytes: 1 %
Lymphocytes Relative: 7 %
Lymphs Abs: 1.2 K/uL (ref 0.7–4.0)
MCH: 29.6 pg (ref 26.0–34.0)
MCHC: 33.1 g/dL (ref 30.0–36.0)
MCV: 89.6 fL (ref 80.0–100.0)
Monocytes Absolute: 1.6 K/uL — ABNORMAL HIGH (ref 0.1–1.0)
Monocytes Relative: 10 %
Neutro Abs: 13 K/uL — ABNORMAL HIGH (ref 1.7–7.7)
Neutrophils Relative %: 81 %
Platelets: 183 K/uL (ref 150–400)
RBC: 2.97 MIL/uL — ABNORMAL LOW (ref 4.22–5.81)
RDW: 16.1 % — ABNORMAL HIGH (ref 11.5–15.5)
WBC: 16.1 K/uL — ABNORMAL HIGH (ref 4.0–10.5)
nRBC: 0 % (ref 0.0–0.2)

## 2024-01-17 LAB — GLUCOSE, CAPILLARY
Glucose-Capillary: 130 mg/dL — ABNORMAL HIGH (ref 70–99)
Glucose-Capillary: 134 mg/dL — ABNORMAL HIGH (ref 70–99)
Glucose-Capillary: 175 mg/dL — ABNORMAL HIGH (ref 70–99)
Glucose-Capillary: 120 mg/dL — ABNORMAL HIGH (ref 70–99)

## 2024-01-17 LAB — C-REACTIVE PROTEIN: CRP: 23.3 mg/dL — ABNORMAL HIGH (ref <1.0)

## 2024-01-17 LAB — PROCALCITONIN: Procalcitonin: 16.73 ng/mL

## 2024-01-17 MED ORDER — SODIUM CHLORIDE 0.9 % IV SOLN
2.0000 g | INTRAVENOUS | Status: DC
  Administered 2024-01-17: 2 g via INTRAVENOUS
  Filled 2024-01-17: qty 20

## 2024-01-17 MED ORDER — CHLORPROMAZINE HCL 25 MG PO TABS
25.0000 mg | ORAL_TABLET | Freq: Three times a day (TID) | ORAL | Status: DC | PRN
  Administered 2024-01-20: 25 mg via ORAL
  Filled 2024-01-17 (×2): qty 1

## 2024-01-17 MED ORDER — HEPARIN SODIUM (PORCINE) 1000 UNIT/ML IJ SOLN
INTRAMUSCULAR | Status: AC
  Filled 2024-01-17: qty 3

## 2024-01-17 MED ORDER — METRONIDAZOLE 500 MG PO TABS
500.0000 mg | ORAL_TABLET | Freq: Two times a day (BID) | ORAL | Status: DC
  Administered 2024-01-17: 500 mg via ORAL
  Filled 2024-01-17: qty 1

## 2024-01-17 MED ORDER — VANCOMYCIN HCL 1750 MG/350ML IV SOLN
1750.0000 mg | Freq: Once | INTRAVENOUS | Status: AC
  Administered 2024-01-17: 1750 mg via INTRAVENOUS
  Filled 2024-01-17: qty 350

## 2024-01-17 MED ORDER — VANCOMYCIN HCL 750 MG/150ML IV SOLN
750.0000 mg | INTRAVENOUS | Status: DC
  Filled 2024-01-17: qty 150

## 2024-01-17 NOTE — Progress Notes (Addendum)
 PHARMACY - PHYSICIAN COMMUNICATION CRITICAL VALUE ALERT - BLOOD CULTURE IDENTIFICATION (BCID)  Jeffrey Young is an 58 y.o. male who presented to [facility] on 01/14/2024 with a chief complaint of acute upper GI bleed and worsening anemia, now with fever with unclear etiology due to possible aspiration   -8/28 AM: 2/4 bottles (collected 8/27) with staph epi w/ mecA gene detected -8/28 PM 1/4 bottles (different set from MRSE collected 8/27) with strep spp (most likely contaminate) -Repeat Cultures from 8/28  Name of physician (or Provider) Contacted: Dr. Alfornia  Current antibiotics: Ceftriaxone  2g IV every 24 hours + flagyl  500mg  IV every 12 hours + Vancomycin  with HD  Changes to prescribed antibiotics recommended:   -Continue current antibiotics  Results for orders placed or performed during the hospital encounter of 01/14/24  Blood Culture ID Panel (Reflexed) (Collected: 01/16/2024  2:54 PM)  Result Value Ref Range   Enterococcus faecalis NOT DETECTED NOT DETECTED   Enterococcus Faecium NOT DETECTED NOT DETECTED   Listeria monocytogenes NOT DETECTED NOT DETECTED   Staphylococcus species NOT DETECTED NOT DETECTED   Staphylococcus aureus (BCID) NOT DETECTED NOT DETECTED   Staphylococcus epidermidis NOT DETECTED NOT DETECTED   Staphylococcus lugdunensis NOT DETECTED NOT DETECTED   Streptococcus species DETECTED (A) NOT DETECTED   Streptococcus agalactiae NOT DETECTED NOT DETECTED   Streptococcus pneumoniae NOT DETECTED NOT DETECTED   Streptococcus pyogenes NOT DETECTED NOT DETECTED   A.calcoaceticus-baumannii NOT DETECTED NOT DETECTED   Bacteroides fragilis NOT DETECTED NOT DETECTED   Enterobacterales NOT DETECTED NOT DETECTED   Enterobacter cloacae complex NOT DETECTED NOT DETECTED   Escherichia coli NOT DETECTED NOT DETECTED   Klebsiella aerogenes NOT DETECTED NOT DETECTED   Klebsiella oxytoca NOT DETECTED NOT DETECTED   Klebsiella pneumoniae NOT DETECTED NOT DETECTED   Proteus  species NOT DETECTED NOT DETECTED   Salmonella species NOT DETECTED NOT DETECTED   Serratia marcescens NOT DETECTED NOT DETECTED   Haemophilus influenzae NOT DETECTED NOT DETECTED   Neisseria meningitidis NOT DETECTED NOT DETECTED   Pseudomonas aeruginosa NOT DETECTED NOT DETECTED   Stenotrophomonas maltophilia NOT DETECTED NOT DETECTED   Candida albicans NOT DETECTED NOT DETECTED   Candida auris NOT DETECTED NOT DETECTED   Candida glabrata NOT DETECTED NOT DETECTED   Candida krusei NOT DETECTED NOT DETECTED   Candida parapsilosis NOT DETECTED NOT DETECTED   Candida tropicalis NOT DETECTED NOT DETECTED   Cryptococcus neoformans/gattii NOT DETECTED NOT DETECTED    Lynwood Poplar, PharmD, BCPS Clinical Pharmacist 01/17/2024 11:56 PM

## 2024-01-17 NOTE — Telephone Encounter (Addendum)
 Mrs. Jeffrey Young is asking to speak w/Dr. Cloretta if possible. [name] told her that Dr. Cloretta recommended Hospice for him and that if he is on Hospice he can't have dialysis any longer. He is very upset and depressed now.  Informed wife that MD did not specifically discuss Hospice today. Confirmed she is aware of gastric biopsy was cancer as well. She agrees to meet with Dr. Cloretta tomorrow at [phone] in hospital room

## 2024-01-17 NOTE — Progress Notes (Signed)
 PROGRESS NOTE                                                                                                                                                                                                             Patient Demographics:    Jeffrey Young, is a 58 y.o. male, DOB - Apr 20, 1966, FMW:[id number]  Outpatient Primary MD for the patient is [facility]    LOS - 1  Admit date - 01/14/2024    Chief Complaint  Patient presents with   Abnormal Lab       Brief Narrative (HPI from H&P)   58 year old male with history of ESRD (MWF), colon cancer s/p partial colectomy, HFpEF, DM, transmetatarsal amputation left foot, anemia (baseline 7-9), presenting with acute upper GI bleed and worsening anemia.    Subjective:    Patient in bed, appears comfortable, denies any headache, no fever, no chest pain or pressure, no shortness of breath , no abdominal pain. No new focal weakness.   Assessment  & Plan :   Acute blood loss anemia, hematemesis- Acute UGI Bleed, history of stage III colon cancer with metastasis, rebleed, had similar episode few weeks ago.  Has been seen by GI, s/p 1 unit of packed RBC transfusion, underwent EGD on 01/15/2024 showing a gastric ulcer which was nonbleeding but did have adherent clot, continue to monitor CBC, continue PPI.  Have also informed patient's oncology about his admission, will get palliative care for goals of care discussions.   Fever on running for the last 24 hours.  Blood cultures drawn morning of 01/16/2024, likely aspirated when he threw up some blood however he also has biliary stent and could have a GI source also is a ESRD patient on HD, placed him on Unasyn , had another hide temperature the night of 01/16/2024, 1 out of 2 cultures growing staph epi which is likely contamination, night team has placed him on vancomycin  as he was running fever on Unasyn , will check with ID,  repeating blood cultures on 01/17/2024.    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 Cloretta. -Continue mirtazapine    Hypertension Blood pressure normal off medications - Hold amlodipine , carvedilol , furosemide , losartan until hemodynamics clear  Hypokalemia and hyponatremia - Treat with dialysis, nephrology on board  Diabetes 2 Glucose controlled - Sliding scale corrections   Lab Results  Component Value Date   HGBA1C 5.4 12/31/2023   CBG (last 3)  Recent Labs    01/16/24 2003 01/16/24 2154 01/17/24 0750  GLUCAP 103* 118* 130*           Condition - Extremely Guarded  Family Communication  :  None  Code Status :  Full  Consults  :  GI, nephrology, Pall care  PUD Prophylaxis : PPI   Procedures  :     EGD  Impression:               - LA Grade D reflux esophagitis with no bleeding.                           - 2 cm hiatal hernia.                           - Non-bleeding gastric ulcer with an adherent clot                            (Forrest Class IIb). Biopsied. Injected.                           - Normal examined duodenum. Recommendation:           - Return patient to hospital ward for ongoing care.                           - Resume regular diet in 2 hours.                           - Continue present medications.                           - Await pathology results.                           - Monitor HGB and transfuse as necessary.                           - PPI QD indefinitely.      Disposition Plan  :    Status is: Observation   DVT Prophylaxis  :    SCDs Start: 01/14/24 2012    Lab Results  Component Value Date   PLT 183 01/17/2024    Diet :  Diet Order             Diet regular Fluid consistency: Thin  Diet effective now                    Inpatient Medications  Scheduled Meds:   sodium chloride    Intravenous Once   calcitRIOL   0.25 mcg Oral Daily   Chlorhexidine  Gluconate Cloth  6 each Topical Q0600   insulin  aspart  0-6 Units Subcutaneous TID WC   mirtazapine   7.5 mg Oral Daily   pantoprazole  (PROTONIX ) IV  40 mg Intravenous Q12H   sodium chloride  flush  3 mL Intravenous Q12H   Continuous Infusions:  ampicillin -sulbactam (UNASYN ) IV  1.5 g (01/17/24 0913)   [START ON 01/18/2024] vancomycin      PRN Meds:.acetaminophen  **OR** acetaminophen , fentaNYL  (SUBLIMAZE ) injection, oxyCODONE , prochlorperazine   Antibiotics  :    Anti-infectives (From admission, onward)    Start     Dose/Rate Route Frequency Ordered Stop   01/18/24 1200  vancomycin  (VANCOREADY) IVPB 750 mg/150 mL        750 mg 150 mL/hr over 60 Minutes Intravenous Every M-W-F (Hemodialysis) 01/17/24 0208     01/17/24 0300  vancomycin  (VANCOREADY) IVPB 1750 mg/350 mL        1,750 mg 175 mL/hr over 120 Minutes Intravenous  Once 01/17/24 0208 01/17/24 0428   01/16/24 1415  ampicillin -sulbactam (UNASYN ) 1.5 g in sodium chloride  0.9 % 100 mL IVPB        1.5 g 200 mL/hr over 30 Minutes Intravenous 2 times daily 01/16/24 1323           Objective:   Vitals:   01/17/24 0200 01/17/24 0347 01/17/24 0752 01/17/24 0801  BP: 118/62 (!) 137/58 (!) 128/53 (!) 126/54  Pulse: 71 77  74  Resp: (!) 23 18  17   Temp: 98.7 F (37.1 C) 97.8 F (36.6 C) 99 F (37.2 C)   TempSrc: Oral Oral Oral   SpO2: 100% 98%  98%  Weight:      Height:        Wt Readings from Last 3 Encounters:  01/16/24 73.3 kg  01/10/24 73.9 kg  01/02/24 70.6 kg     Intake/Output Summary (Last 24 hours) at 01/17/2024 0945 Last data filed at 01/17/2024 0555 Gross per 24 hour  Intake 793 ml  Output 2000 ml  Net -1207 ml     Physical Exam  Awake Alert, No new F.N deficits, Normal affect Kennan.AT,PERRAL Supple Neck, No JVD,   Symmetrical Chest wall movement, Good air movement bilaterally, CTAB RRR,No Gallops,Rubs or new Murmurs,   +ve B.Sounds, Abd Soft, No tenderness,   No Cyanosis, Clubbing or edema        Data Review:    Recent Labs  Lab 01/14/24 1740 01/15/24 0403 01/15/24 1306 01/16/24 0532 01/17/24 0623  WBC 18.2* 12.9* 15.9* 13.8* 16.1*  HGB 7.6* 6.6* 9.2*  9.3* 8.5* 8.8*  HCT 23.3* 20.4* 27.4*  27.8* 24.8* 26.6*  PLT 199 171 190 165 183  MCV 90.7 92.3 89.3 87.6 89.6  MCH 29.6 29.9 30.0 30.0 29.6  MCHC 32.6 32.4 33.6 34.3 33.1  RDW 16.5* 16.7* 16.5* 16.0* 16.1*  LYMPHSABS  --   --   --  0.8 1.2  MONOABS  --   --   --  1.5* 1.6*  EOSABS  --   --   --  0.0 0.1  BASOSABS  --   --   --  0.0 0.1    Recent Labs  Lab 01/14/24 1740 01/15/24 0403 01/16/24 0532 01/17/24 0623  NA 132* 130* 128* 133*  K 3.4* 3.1* 2.9* 3.5  CL 92* 93* 92* 95*  CO2 29 27 28 28   ANIONGAP 11 10 8 10   GLUCOSE 135* 145* 237* 120*  BUN 9 14 21* 18  CREATININE 2.33* 3.32* 4.48* 3.63*  AST 39 40 39 37  ALT 33 30 30 29   ALKPHOS 242* 200* 211* 194*  BILITOT 2.6* 2.2* 2.1* 2.5*  ALBUMIN  1.7* 1.5* 1.6* 1.5*  CALCIUM  7.9* 7.8* 7.8* 8.0*      Recent Labs  Lab 01/14/24 1740 01/15/24 0403 01/16/24 0532 01/17/24 0623  CALCIUM  7.9* 7.8* 7.8* 8.0*    ---------------------------------------------------------------------------------------------------------------  Lab Results  Component Value Date   CHOL 152 07/27/2020   HDL 36 (L) 07/27/2020   LDLCALC 94 07/27/2020   TRIG 109 07/27/2020   CHOLHDL 4.2 07/27/2020    Lab Results  Component Value Date   HGBA1C 5.4 12/31/2023   No results for input(s): TSH, T4TOTAL, FREET4, T3FREE, THYROIDAB in the last 72 hours. No results for input(s): VITAMINB12, FOLATE, FERRITIN, TIBC, IRON, RETICCTPCT in the last 72 hours. ------------------------------------------------------------------------------------------------------------------ Cardiac Enzymes No results for input(s): CKMB, TROPONINI, MYOGLOBIN in the last 168 hours.  Invalid input(s):  CK  Micro Results Recent Results (from the past 240 hours)  Culture, blood (Routine X 2) w Reflex to ID Panel     Status: None (Preliminary result)   Collection Time: 01/16/24  7:42 AM   Specimen: BLOOD LEFT ARM  Result Value Ref Range Status   Specimen Description BLOOD LEFT ARM  Final   Special Requests   Final    BOTTLES DRAWN AEROBIC AND ANAEROBIC Blood Culture adequate volume   Culture  Setup Time   Final    GRAM POSITIVE COCCI IN CLUSTERS IN BOTH AEROBIC AND ANAEROBIC BOTTLES CRITICAL RESULT CALLED TO, READ BACK BY AND VERIFIED WITH: MAYA LYNWOOD ORN  [id number] FCP Performed at [facility], [address], [place], [place] [address]    Culture GRAM POSITIVE COCCI  Final   Report Status PENDING  Incomplete  Blood Culture ID Panel (Reflexed)     Status: Abnormal   Collection Time: 01/16/24  7:42 AM  Result Value Ref Range Status   Enterococcus faecalis NOT DETECTED NOT DETECTED Final   Enterococcus Faecium NOT DETECTED NOT DETECTED Final   Listeria monocytogenes NOT DETECTED NOT DETECTED Final   Staphylococcus species DETECTED (A) NOT DETECTED Final    Comment: CRITICAL RESULT CALLED TO, READ BACK BY AND VERIFIED WITH: MAYA LYNWOOD ORN  [id number] FCP    Staphylococcus aureus (BCID) NOT DETECTED NOT DETECTED Final   Staphylococcus epidermidis DETECTED (A) NOT DETECTED Final    Comment: Methicillin (oxacillin) resistant coagulase negative staphylococcus. Possible blood culture contaminant (unless isolated from more than one blood culture draw or clinical case suggests pathogenicity). No antibiotic treatment is indicated for blood  culture contaminants. CRITICAL RESULT CALLED TO, READ BACK BY AND VERIFIED WITH: MAYA LYNWOOD ORN  [id number] FCP    Staphylococcus lugdunensis NOT DETECTED NOT DETECTED Final   Streptococcus species NOT DETECTED NOT DETECTED Final   Streptococcus agalactiae NOT DETECTED NOT DETECTED Final   Streptococcus pneumoniae NOT DETECTED NOT DETECTED  Final   Streptococcus pyogenes NOT DETECTED NOT DETECTED Final   A.calcoaceticus-baumannii NOT DETECTED NOT DETECTED Final   Bacteroides fragilis NOT DETECTED NOT DETECTED Final   Enterobacterales NOT DETECTED NOT DETECTED Final   Enterobacter cloacae complex NOT DETECTED NOT DETECTED Final   Escherichia coli NOT DETECTED NOT DETECTED Final   Klebsiella aerogenes NOT DETECTED NOT DETECTED Final   Klebsiella oxytoca NOT DETECTED NOT DETECTED Final   Klebsiella pneumoniae NOT DETECTED NOT DETECTED Final   Proteus species NOT DETECTED NOT DETECTED Final   Salmonella species NOT DETECTED NOT DETECTED Final   Serratia marcescens NOT DETECTED NOT DETECTED Final   Haemophilus influenzae NOT DETECTED NOT DETECTED Final   Neisseria meningitidis NOT DETECTED NOT DETECTED Final   Pseudomonas aeruginosa NOT DETECTED NOT DETECTED Final   Stenotrophomonas maltophilia NOT DETECTED NOT DETECTED Final   Candida albicans NOT DETECTED NOT DETECTED Final   Candida auris NOT DETECTED NOT DETECTED Final  Candida glabrata NOT DETECTED NOT DETECTED Final   Candida krusei NOT DETECTED NOT DETECTED Final   Candida parapsilosis NOT DETECTED NOT DETECTED Final   Candida tropicalis NOT DETECTED NOT DETECTED Final   Cryptococcus neoformans/gattii NOT DETECTED NOT DETECTED Final   Methicillin resistance mecA/C DETECTED (A) NOT DETECTED Final    Comment: CRITICAL RESULT CALLED TO, READ BACK BY AND VERIFIED WITH: MAYA LYNWOOD ORN  [id number] FCP Performed at [facility], [address] [place], [place] [address]     Radiology Report DG Abd Portable 1V Result Date: 01/16/2024 EXAM: 1 VIEW XRAY OF THE ABDOMEN 01/16/2024 01:41:00 PM COMPARISON: None available. CLINICAL HISTORY: Biliary stent migration FINDINGS: BOWEL: Nonobstructive bowel gas pattern. The proximal portion of the biliary stent projects over the inferior portion of the T12 vertebra and the distal portion of the stent projects over the midline of  the L2 vertebra. There is some rotational artifact with the stent in the projection of the spine. On the ERCP images, the proximal portion of the stent was at the superior aspect of the T12 level with the distal portion of the stent was over the L2 level. SOFT TISSUES: No opaque urinary calculi. Biliary stent in upper abdomen. Surgical chain sutures in left hemiabdomen. Partially imaged vascular catheter tip in right atrium. BONES: No acute osseous abnormality. IMPRESSION: 1. Slight rotational artifact with biliary stent projecting over the lower thoracic and upper lumbar spine. 2. Cannot exclude mild antegrade migration of the biliary stent. Biliary stent in upper abdomen with some rotational artifact, consistent with the reported migration. Electronically signed by: Waddell Calk MD 01/16/2024 02:08 PM EDT RP Workstation: [id number]   DG Chest Port 1 View Result Date: 01/16/2024 EXAM: 1 VIEW XRAY OF THE CHEST 01/16/2024 06:11:00 AM COMPARISON: 12/06/2023 CLINICAL HISTORY: SOB (shortness of breath) 141880. Reason for exam: SOB FINDINGS: LUNGS AND PLEURA: Moderate right and small left pleural effusions with associated bibasilar opacities. Mild pulmonary edema. HEART AND MEDIASTINUM: Mild cardiomegaly. BONES AND SOFT TISSUES: No acute osseous abnormality. LINES AND TUBES: Right IJ CVC in place with tip overlying proximal right atrium. Left chest port in place with catheter tip overlying right atrium. IMPRESSION: 1. Moderate right and small left pleural effusions with associated bibasilar opacities. 2. Mild pulmonary edema. 3. Mild cardiomegaly. Electronically signed by: Waddell Calk MD 01/16/2024 06:26 AM EDT RP Workstation: [id number]     Signature  -   Lavada Stank M.D on 01/17/2024 at 9:45 AM   -  To page go to [email or web address]

## 2024-01-17 NOTE — Progress Notes (Signed)
 Subjective: No complaints.  Objective: Vital signs in last 24 hours: Temp:  [97.8 F (36.6 C)-102.3 F (39.1 C)] 99 F (37.2 C) (08/28 0752) Pulse Rate:  [71-89] 74 (08/28 0801) Resp:  [17-23] 17 (08/28 0801) BP: (116-140)/(52-65) 126/54 (08/28 0801) SpO2:  [96 %-100 %] 98 % (08/28 0801) Last BM Date : 01/15/24  Intake/Output from previous day: 08/27 0701 - 08/28 0700 In: 793 [P.O.:240; I.V.:3; IV Piggyback:550] Out: 2000  Intake/Output this shift: No intake/output data recorded.  General appearance: alert and fatigued GI: soft, non-tender; bowel sounds normal; no masses,  no organomegaly  Lab Results: Recent Labs    01/15/24 1306 01/16/24 0532 01/17/24 0623  WBC 15.9* 13.8* 16.1*  HGB 9.2*  9.3* 8.5* 8.8*  HCT 27.4*  27.8* 24.8* 26.6*  PLT 190 165 183   BMET Recent Labs    01/15/24 0403 01/16/24 0532 01/17/24 0623  NA 130* 128* 133*  K 3.1* 2.9* 3.5  CL 93* 92* 95*  CO2 27 28 28   GLUCOSE 145* 237* 120*  BUN 14 21* 18  CREATININE 3.32* 4.48* 3.63*  CALCIUM  7.8* 7.8* 8.0*   LFT Recent Labs    01/15/24 0403 01/16/24 0532 01/17/24 0623  PROT 5.4*   < > 5.6*  ALBUMIN  1.5*   < > 1.5*  AST 40   < > 37  ALT 30   < > 29  ALKPHOS 200*   < > 194*  BILITOT 2.2*   < > 2.5*  BILIDIR 0.8*  --   --   IBILI 1.4*  --   --    < > = values in this interval not displayed.   PT/INR No results for input(s): LABPROT, INR in the last 72 hours. Hepatitis Panel No results for input(s): HEPBSAG, HCVAB, HEPAIGM, HEPBIGM in the last 72 hours. C-Diff No results for input(s): CDIFFTOX in the last 72 hours. Fecal Lactopherrin No results for input(s): FECLLACTOFRN in the last 72 hours.  Studies/Results: DG Abd Portable 1V Result Date: 01/16/2024 EXAM: 1 VIEW XRAY OF THE ABDOMEN 01/16/2024 01:41:00 PM COMPARISON: None available. CLINICAL HISTORY: Biliary stent migration FINDINGS: BOWEL: Nonobstructive bowel gas pattern. The proximal portion of the biliary  stent projects over the inferior portion of the T12 vertebra and the distal portion of the stent projects over the midline of the L2 vertebra. There is some rotational artifact with the stent in the projection of the spine. On the ERCP images, the proximal portion of the stent was at the superior aspect of the T12 level with the distal portion of the stent was over the L2 level. SOFT TISSUES: No opaque urinary calculi. Biliary stent in upper abdomen. Surgical chain sutures in left hemiabdomen. Partially imaged vascular catheter tip in right atrium. BONES: No acute osseous abnormality. IMPRESSION: 1. Slight rotational artifact with biliary stent projecting over the lower thoracic and upper lumbar spine. 2. Cannot exclude mild antegrade migration of the biliary stent. Biliary stent in upper abdomen with some rotational artifact, consistent with the reported migration. Electronically signed by: Waddell Calk MD 01/16/2024 02:08 PM EDT RP Workstation: [id number]   DG Chest Port 1 View Result Date: 01/16/2024 EXAM: 1 VIEW XRAY OF THE CHEST 01/16/2024 06:11:00 AM COMPARISON: 12/06/2023 CLINICAL HISTORY: SOB (shortness of breath) 141880. Reason for exam: SOB FINDINGS: LUNGS AND PLEURA: Moderate right and small left pleural effusions with associated bibasilar opacities. Mild pulmonary edema. HEART AND MEDIASTINUM: Mild cardiomegaly. BONES AND SOFT TISSUES: No acute osseous abnormality. LINES AND TUBES: Right IJ CVC  in place with tip overlying proximal right atrium. Left chest port in place with catheter tip overlying right atrium. IMPRESSION: 1. Moderate right and small left pleural effusions with associated bibasilar opacities. 2. Mild pulmonary edema. 3. Mild cardiomegaly. Electronically signed by: Waddell Calk MD 01/16/2024 06:26 AM EDT RP Workstation: [id number]    Medications: Scheduled:  sodium chloride    Intravenous Once   calcitRIOL   0.25 mcg Oral Daily   Chlorhexidine  Gluconate Cloth  6 each Topical Q0600    insulin  aspart  0-6 Units Subcutaneous TID WC   metroNIDAZOLE   500 mg Oral Q12H   mirtazapine   7.5 mg Oral Daily   pantoprazole  (PROTONIX ) IV  40 mg Intravenous Q12H   sodium chloride  flush  3 mL Intravenous Q12H   Continuous:  cefTRIAXone  (ROCEPHIN )  IV     [START ON 01/18/2024] vancomycin       Assessment/Plan: 1) Metastatic colon cancer. 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 3) Fever.   The results of the gastric biopsies were discussed with the patient.  It was positive for adenocarcinoma, which is consistent with his metastatic state.  HGB is stable and there were no reports of further hematemesis.  He does have persistently elevated AP and TB, but this is as a result of his metastatic state.    Plan: 1) No further GI intervention. 2) Further management per Oncology and Hospitalist. 3) Signing off.  LOS: 1 day   Jimena Wieczorek D 01/17/2024, 2:44 PM

## 2024-01-17 NOTE — Progress Notes (Signed)
 Jeffrey Young is an 58 y.o. 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 Admitted due to hematemesis. On admission his hgb was 6.6. He was given 1 unit of PRBC. EGD 01/15/2024   Dialysis Orders: [facility] MWF EDW 74 kg - reported that he is losing weight and has poor appetite 4 hours 3K/2.5 Ca bath 400/600 No ESA due to malignancy Hectorol 1 mcg  Assessment/Plan:  Upper GI bleed: patient had episode of hematemesis post HD on 01/14/24. Hgb dropped to 6.6 and he was given 1 unit of PRBC in ED.  Patient just hospitalized for similar episode on 8/12-8/14 and had stent placed during ERCP. EGD 01/15/2024 showed a gastric ulcer with adherent clot, reflux esophagitis but no active bleed.   ESRD:  On HD at [facility] clinic MWF.  Using [facility] and has R AVG that is maturing and will be ready for use 01/17/24 (surgical date on 12/06/2023).   Tolerated treatment on Wednesday with 2 L net UF  Plan on cannulating the right upper arm AV graft tomorrow expediting removal of the TDC (fevers noted overnight)  HFpEF: does not appear to be volume overloaded Hyperbilirubinemia: elevated on presentation but showing mild improvement. CT noted increase in intrahepatic vile duct dilation compared from CT 10/13/23. Appears to be second to pancreatic mass or lymphadenopathy. Had stent replaced 01/02/24 by Dr. Rollin.   Hypertension/volume: BP fairly well-controlled today. Reported that he has been losing weight from his home unit. He has had his EDW lowered multiple times. He was recently placed on Mirtazapine . Patient appears euvolemic on exam.   Anemia: Not on ESA due to malignancy. He has received 1 U PRBC so far with this hospitalization. Last Hgb 6.6 -> 9.2 -> 8.5. No venofer due  to high ferritin level.  Metabolic bone disease: On hectorol 1 mcg. No calcitriol . Corrected Ca 9.8.   Nutrition:  Albumin  1.5. Starting protein supplements when he is cleared for diet.  DM: on SSI per primary. Patient is currently NPO.  Subjective: Lethargic but easily arousable, breathing comfortably.  Patient had a Tmax of 102.3 overnight.  He denies any diarrhea, cough, abdominal pain.  Is actually asking when he is can go home.     Chemistry and CBC: Creatinine  Date/Time Value Ref Range Status  01/10/2024 08:00 AM 3.13 (H) 0.61 - 1.24 mg/dL Final  92/98/7974 88:74 AM 6.41 (H) 0.61 - 1.24 mg/dL Final  93/81/7974 89:94 AM 6.79 (H) 0.61 - 1.24 mg/dL Final  94/80/7974 91:89 AM 5.73 (H) 0.61 - 1.24 mg/dL Final  95/77/7974 90:76 AM 6.66 (H) 0.61 - 1.24 mg/dL Final  95/89/7974 91:79 AM 6.11 (H) 0.61 - 1.24 mg/dL Final  97/82/7974 98:88 PM 5.59 (H) 0.61 - 1.24 mg/dL Final  98/78/7974 89:68 AM 4.48 (H) 0.61 - 1.24 mg/dL Final  98/93/7974 88:56 AM 5.42 (H) 0.61 - 1.24 mg/dL Final  93/82/7975 91:85 AM 3.53 (H) 0.61 - 1.24 mg/dL Final  95/98/7975 88:86 AM 3.11 (H) 0.61 - 1.24 mg/dL Final  97/83/7975 91:91 AM 3.17 (H) 0.61 - 1.24 mg/dL Final  87/70/7976 89:59 AM 2.94 (H) 0.61 - 1.24 mg/dL Final  88/86/7976 89:93 AM 3.01 (HH) 0.61 - 1.24 mg/dL Final    Comment:    CRITICAL RESULT CALLED TO, READ BACK BY AND VERIFIED WITH: CT  SUSAN COWARD @ 1134. 04/03/2022.klj   11/07/2021 08:31 AM 2.83 (H) 0.61 - 1.24 mg/dL Final  89/89/7977 87:87 PM 1.72 (H) 0.61 - 1.24 mg/dL Final  90/72/7977 91:73 AM 1.82 (H) 0.61 - 1.24 mg/dL Final  90/87/7977 91:56 AM 1.34 (H) 0.61 - 1.24 mg/dL Final  91/70/7977 91:84 AM 1.54 (H) 0.61 - 1.24 mg/dL Final  91/84/7977 90:89 AM 1.34 (H) 0.61 - 1.24 mg/dL Final  91/98/7977 87:69 PM 1.75 (H) 0.61 - 1.24 mg/dL Final  92/81/7977 91:69 AM 1.36 (H) 0.61 - 1.24 mg/dL Final  92/94/7977 91:80 AM 1.58 (H) 0.61 - 1.24 mg/dL Final  93/79/7977 91:79 AM 1.77 (H) 0.61 - 1.24 mg/dL  Final  93/86/7977 91:99 AM 1.64 (H) 0.61 - 1.24 mg/dL Final  93/93/7977 91:71 AM 1.79 (H) 0.61 - 1.24 mg/dL Final  94/76/7977 91:50 AM 2.00 (H) 0.61 - 1.24 mg/dL Final  94/90/7977 91:66 AM 2.10 (H) 0.61 - 1.24 mg/dL Final  95/74/7977 91:99 AM 2.21 (H) 0.61 - 1.24 mg/dL Final  95/88/7977 90:72 AM 2.60 (H) 0.61 - 1.24 mg/dL Final  96/71/7977 92:48 AM 2.01 (H) 0.61 - 1.24 mg/dL Final   Creatinine, Ser  Date/Time Value Ref Range Status  01/17/2024 06:23 AM 3.63 (H) 0.61 - 1.24 mg/dL Final  91/72/7974 94:67 AM 4.48 (H) 0.61 - 1.24 mg/dL Final  91/73/7974 95:96 AM 3.32 (H) 0.61 - 1.24 mg/dL Final  91/74/7974 94:59 PM 2.33 (H) 0.61 - 1.24 mg/dL Final  91/86/7974 95:50 AM 4.38 (H) 0.61 - 1.24 mg/dL Final  91/87/7974 97:48 AM 3.01 (H) 0.61 - 1.24 mg/dL Final  91/88/7974 93:65 PM 1.90 (H) 0.61 - 1.24 mg/dL Final  91/88/7974 93:93 PM 2.09 (H) 0.61 - 1.24 mg/dL Final  92/82/7974 90:44 AM 3.70 (H) 0.61 - 1.24 mg/dL Final  87/68/7975 94:99 AM 5.06 (H) 0.61 - 1.24 mg/dL Final  87/69/7975 97:43 AM 5.98 (H) 0.61 - 1.24 mg/dL Final  87/70/7975 96:92 AM 5.89 (H) 0.61 - 1.24 mg/dL Final  87/71/7975 96:59 AM 5.36 (H) 0.61 - 1.24 mg/dL Final  87/72/7975 87:60 PM 5.70 (H) 0.61 - 1.24 mg/dL Final  87/72/7975 96:80 AM 4.46 (H) 0.61 - 1.24 mg/dL Final  87/73/7975 97:43 AM 4.37 (H) 0.61 - 1.24 mg/dL Final  87/74/7975 96:83 AM 4.37 (H) 0.61 - 1.24 mg/dL Final  87/75/7975 98:87 AM 4.30 (H) 0.61 - 1.24 mg/dL Final  87/76/7975 97:82 AM 4.23 (H) 0.61 - 1.24 mg/dL Final  87/77/7975 95:83 AM 4.71 (H) 0.61 - 1.24 mg/dL Final  87/78/7975 96:95 AM 4.84 (H) 0.61 - 1.24 mg/dL Final  87/79/7975 94:92 PM 4.72 (H) 0.61 - 1.24 mg/dL Final  87/79/7975 96:81 AM 3.37 (H) 0.61 - 1.24 mg/dL Final  87/80/7975 95:93 AM 4.14 (H) 0.61 - 1.24 mg/dL Final  87/81/7975 96:80 AM 4.18 (H) 0.61 - 1.24 mg/dL Final  87/82/7975 95:89 AM 4.75 (H) 0.61 - 1.24 mg/dL Final  87/83/7975 89:76 AM 5.02 (H) 0.61 - 1.24 mg/dL Final  88/76/7977 96:54  AM 1.56 (H) 0.61 - 1.24 mg/dL Final  88/77/7977 95:74 AM 1.76 (H) 0.61 - 1.24 mg/dL Final  88/78/7977 96:99 AM 1.55 (H) 0.61 - 1.24 mg/dL Final  88/79/7977 96:46 AM 1.43 (H) 0.61 - 1.24 mg/dL Final  88/80/7977 94:66 AM 1.39 (H) 0.61 - 1.24 mg/dL Final  88/81/7977 96:61 AM 1.68 (H) 0.61 - 1.24 mg/dL Final  88/82/7977 96:68 AM 1.57 (H) 0.61 - 1.24 mg/dL Final  88/83/7977 94:57 AM 1.52 (H) 0.61 - 1.24 mg/dL Final  88/84/7977 96:40 PM 1.73 (H) 0.61 - 1.24 mg/dL  Final  04/04/2021 04:33 AM 1.80 (H) 0.61 - 1.24 mg/dL Final  88/86/7977 95:63 AM 1.92 (H) 0.61 - 1.24 mg/dL Final  88/87/7977 94:41 AM 2.09 (H) 0.61 - 1.24 mg/dL Final  88/88/7977 90:69 PM 2.18 (H) 0.61 - 1.24 mg/dL Final  95/69/7977 92:52 PM 2.20 (H) 0.61 - 1.24 mg/dL Final  96/84/7977 89:57 AM 2.13 (H) 0.61 - 1.24 mg/dL Final  96/85/7977 95:44 AM 2.30 (H) 0.61 - 1.24 mg/dL Final  96/86/7977 94:63 AM 2.55 (H) 0.61 - 1.24 mg/dL Final  96/87/7977 87:78 PM 2.53 (H) 0.61 - 1.24 mg/dL Final  96/87/7977 94:83 AM 2.41 (H) 0.61 - 1.24 mg/dL Final  96/88/7977 95:46 AM 1.81 (H) 0.61 - 1.24 mg/dL Final  96/89/7977 94:64 AM 2.12 (H) 0.61 - 1.24 mg/dL Final  96/90/7977 94:94 AM 1.85 (H) 0.61 - 1.24 mg/dL Final  96/91/7977 93:98 AM 1.65 (H) 0.61 - 1.24 mg/dL Final  96/92/7977 94:85 AM 1.95 (H) 0.61 - 1.24 mg/dL Final  96/93/7977 89:55 AM 1.57 (H) 0.61 - 1.24 mg/dL Final   Recent Labs  Lab 01/14/24 1740 01/15/24 0403 01/16/24 0532 01/17/24 0623  NA 132* 130* 128* 133*  K 3.4* 3.1* 2.9* 3.5  CL 92* 93* 92* 95*  CO2 29 27 28 28   GLUCOSE 135* 145* 237* 120*  BUN 9 14 21* 18  CREATININE 2.33* 3.32* 4.48* 3.63*  CALCIUM  7.9* 7.8* 7.8* 8.0*   Recent Labs  Lab 01/15/24 0403 01/15/24 1306 01/16/24 0532 01/17/24 0623  WBC 12.9* 15.9* 13.8* 16.1*  NEUTROABS  --   --  11.3* 13.0*  HGB 6.6* 9.2*  9.3* 8.5* 8.8*  HCT 20.4* 27.4*  27.8* 24.8* 26.6*  MCV 92.3 89.3 87.6 89.6  PLT 171 190 165 183   Liver Function Tests: Recent Labs  Lab  01/15/24 0403 01/16/24 0532 01/17/24 0623  AST 40 39 37  ALT 30 30 29   ALKPHOS 200* 211* 194*  BILITOT 2.2* 2.1* 2.5*  PROT 5.4* 5.5* 5.6*  ALBUMIN  1.5* 1.6* 1.5*   No results for input(s): LIPASE, AMYLASE in the last 168 hours. No results for input(s): AMMONIA in the last 168 hours. Cardiac Enzymes: No results for input(s): CKTOTAL, CKMB, CKMBINDEX, TROPONINI in the last 168 hours. Iron Studies: No results for input(s): IRON, TIBC, TRANSFERRIN, FERRITIN in the last 72 hours. PT/INR: @LABRCNTIP (inr:5)  Xrays/Other Studies: ) Results for orders placed or performed during the hospital encounter of 01/14/24 (from the past 48 hours)  CBG monitoring, ED     Status: Abnormal   Collection Time: 01/15/24  9:46 AM  Result Value Ref Range   Glucose-Capillary 139 (H) 70 - 99 mg/dL    Comment: Glucose reference range applies only to samples taken after fasting for at least 8 hours.  CBC     Status: Abnormal   Collection Time: 01/15/24  1:06 PM  Result Value Ref Range   WBC 15.9 (H) 4.0 - 10.5 K/uL   RBC 3.07 (L) 4.22 - 5.81 MIL/uL   Hemoglobin 9.2 (L) 13.0 - 17.0 g/dL    Comment: REPEATED TO VERIFY   HCT 27.4 (L) 39.0 - 52.0 %    Comment: POST TRANSFUSION SPECIMEN   MCV 89.3 80.0 - 100.0 fL   MCH 30.0 26.0 - 34.0 pg   MCHC 33.6 30.0 - 36.0 g/dL   RDW 83.4 (H) 88.4 - 84.4 %   Platelets 190 150 - 400 K/uL   nRBC 0.0 0.0 - 0.2 %    Comment: Performed at [facility],  [address], [place], [place] [address]  Hemoglobin and hematocrit, blood     Status: Abnormal   Collection Time: 01/15/24  1:06 PM  Result Value Ref Range   Hemoglobin 9.3 (L) 13.0 - 17.0 g/dL    Comment: REPEATED TO VERIFY POST TRANSFUSION SPECIMEN    HCT 27.8 (L) 39.0 - 52.0 %    Comment: Performed at [facility], [address], [place], [place] [address]  Glucose, capillary     Status: Abnormal   Collection Time: 01/15/24  1:37 PM  Result Value Ref Range    Glucose-Capillary 151 (H) 70 - 99 mg/dL    Comment: Glucose reference range applies only to samples taken after fasting for at least 8 hours.  Surgical pathology     Status: None   Collection Time: 01/15/24  2:15 PM  Result Value Ref Range   SURGICAL PATHOLOGY      SURGICAL PATHOLOGY CASE: [id number] PATIENT: HELAYNE FUSE Surgical Pathology Report     Clinical History: hematemesis, R/O cancer (cm)     FINAL MICROSCOPIC DIAGNOSIS:  A. 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      See comment.  COMMENT:  The specimen shows gastric mucosa with underlying malignant glandular proliferation forming cribriform architecture with luminal necrosis. There is no surface involvement or precursor lesion identified. The patients history of colorectal adenocarcinoma is noted. The findings most likely represent secondary involvement by the patients known colorectal adenocarcinoma. A secondary gastric carcinoma is much less likely based on the morphological features, however the possibility cannot be entirely excluded. Clinical and endoscopic correlation is recommended.   GROSS DESCRIPTION:  Received in formalin are pink-gray to dark red soft tissue fragments that are submitted in toto. Number: 4.  Si ze: Minute fragment to 0.25 cm.  Blocks: 1  SW 01/15/2024  Final Diagnosis performed by Pepper Dutton, MD.   Electronically signed 01/16/2024 Technical and / or Professional components performed at [facility]. [facility], [address] [place], [place] [address].  Immunohistochemistry Technical component (if applicable) was performed at [facility]. [address], [address], [place], [place] [address].   IMMUNOHISTOCHEMISTRY DISCLAIMER (if applicable): Some of these immunohistochemical stains may have been developed and the performance characteristics determine by [facility]. Some may not have been cleared or approved  by the U.S. Food and Drug Administration. The FDA has determined that such clearance or approval is not necessary. This test is used for clinical purposes. It should not be regarded as investigational or for research. This laboratory is certified under the Clinical Laboratory Improvement Amendments of 1988 (CLIA-88) as  qualified to perform high complexity clinical laboratory testing.  The controls stained appropriately.   IHC stains are performed on formalin fixed, paraffin embedded tissue using a 3,3diaminobenzidine (DAB) chromogen and Leica Bond Autostainer System. The staining intensity of the nucleus is score manually and is reported as the percentage of tumor cell nuclei demonstrating specific nuclear staining. The specimens are fixed in 10% Neutral Formalin for at least 6 hours and up to 72hrs. These tests are validated on decalcified tissue. Results should be interpreted with caution given the possibility of false negative results on decalcified specimens. Antibody Clones are as follows ER-clone 60F, PR-clone 16, Ki67- clone MM1. Some of these immunohistochemical stains may have been developed and the performance characteristics determined by [facility].   Glucose, capillary     Status: Abnormal   Collection Time: 01/15/24  4:01 PM  Result Value Ref Range   Glucose-Capillary 155 (H) 70 - 99 mg/dL    Comment: Glucose reference range applies only to samples taken after fasting for at least 8 hours.  Glucose, capillary     Status: Abnormal   Collection Time: 01/15/24  8:59 PM  Result Value Ref Range   Glucose-Capillary 146 (H) 70 - 99 mg/dL    Comment: Glucose reference range applies only to samples taken after fasting for at least 8 hours.  Comprehensive metabolic panel with GFR     Status: Abnormal   Collection Time: 01/16/24  5:32 AM  Result Value Ref Range   Sodium 128 (L) 135 - 145 mmol/L   Potassium 2.9 (L) 3.5 - 5.1 mmol/L   Chloride 92 (L) 98 - 111 mmol/L    CO2 28 22 - 32 mmol/L   Glucose, Bld 237 (H) 70 - 99 mg/dL    Comment: Glucose reference range applies only to samples taken after fasting for at least 8 hours.   BUN 21 (H) 6 - 20 mg/dL   Creatinine, Ser 5.51 (H) 0.61 - 1.24 mg/dL   Calcium  7.8 (L) 8.9 - 10.3 mg/dL   Total Protein 5.5 (L) 6.5 - 8.1 g/dL   Albumin  1.6 (L) 3.5 - 5.0 g/dL   AST 39 15 - 41 U/L   ALT 30 0 - 44 U/L   Alkaline Phosphatase 211 (H) 38 - 126 U/L   Total Bilirubin 2.1 (H) 0.0 - 1.2 mg/dL   GFR, Estimated 14 (L) >60 mL/min    Comment: (NOTE) Calculated using the CKD-EPI Creatinine Equation (2021)    Anion gap 8 5 - 15    Comment: Performed at [facility], [address] [place], [place] [address]  CBC with Differential/Platelet     Status: Abnormal   Collection Time: 01/16/24  5:32 AM  Result Value Ref Range   WBC 13.8 (H) 4.0 - 10.5 K/uL   RBC 2.83 (L) 4.22 - 5.81 MIL/uL   Hemoglobin 8.5 (L) 13.0 - 17.0 g/dL   HCT 75.1 (L) 60.9 - 47.9 %   MCV 87.6 80.0 - 100.0 fL   MCH 30.0 26.0 - 34.0 pg   MCHC 34.3 30.0 - 36.0 g/dL   RDW 83.9 (H) 88.4 - 84.4 %   Platelets 165 150 - 400 K/uL   nRBC 0.0 0.0 - 0.2 %   Neutrophils Relative % 82 %   Neutro Abs 11.3 (H) 1.7 - 7.7 K/uL   Lymphocytes Relative 6 %   Lymphs Abs 0.8 0.7 - 4.0 K/uL   Monocytes Relative 11 %   Monocytes Absolute 1.5 (H) 0.1 - 1.0 K/uL   Eosinophils Relative 0 %   Eosinophils Absolute 0.0 0.0 - 0.5 K/uL   Basophils Relative 0 %   Basophils Absolute 0.0 0.0 - 0.1 K/uL   Immature Granulocytes 1 %   Abs Immature Granulocytes 0.19 (H) 0.00 - 0.07 K/uL    Comment: Performed at [facility], [address], [place], [place] [address]  Glucose, capillary     Status: Abnormal   Collection Time: 01/16/24  7:41 AM  Result Value Ref Range   Glucose-Capillary 239 (H) 70 - 99 mg/dL    Comment: Glucose reference range applies only to samples taken after fasting for at least 8 hours.  Culture, blood (Routine X 2) w Reflex to ID Panel      Status: None (Preliminary result)   Collection Time: 01/16/24  7:42 AM   Specimen: BLOOD LEFT ARM  Result Value Ref Range   Specimen Description BLOOD LEFT ARM    Special Requests      BOTTLES DRAWN AEROBIC AND ANAEROBIC Blood Culture adequate volume   Culture  Setup Time      GRAM POSITIVE COCCI IN CLUSTERS IN BOTH AEROBIC AND ANAEROBIC BOTTLES CRITICAL RESULT CALLED TO, READ BACK BY AND VERIFIED WITH: MAYA LYNWOOD ORN  [id number] FCP Performed at [facility], [address], [place], [place] [address]    Culture GRAM POSITIVE COCCI    Report Status PENDING   Blood Culture ID Panel (Reflexed)     Status: Abnormal   Collection Time: 01/16/24  7:42 AM  Result Value Ref Range   Enterococcus faecalis NOT DETECTED NOT DETECTED   Enterococcus Faecium NOT DETECTED NOT DETECTED   Listeria monocytogenes NOT DETECTED NOT DETECTED   Staphylococcus species DETECTED (A) NOT DETECTED    Comment: CRITICAL RESULT CALLED TO, READ BACK BY AND VERIFIED WITH: MAYA LYNWOOD ORN  [id number] FCP    Staphylococcus aureus (BCID) NOT DETECTED NOT DETECTED   Staphylococcus epidermidis DETECTED (A) NOT DETECTED    Comment: Methicillin (oxacillin) resistant coagulase negative staphylococcus. Possible blood culture contaminant (unless isolated from more than one blood culture draw or clinical case suggests pathogenicity). No antibiotic treatment is indicated for blood  culture contaminants. CRITICAL RESULT CALLED TO, READ BACK BY AND VERIFIED WITH: MAYA LYNWOOD ORN  [id number] FCP    Staphylococcus lugdunensis NOT DETECTED NOT DETECTED   Streptococcus species NOT DETECTED NOT DETECTED   Streptococcus agalactiae NOT DETECTED NOT DETECTED   Streptococcus pneumoniae NOT DETECTED NOT DETECTED   Streptococcus pyogenes NOT DETECTED NOT DETECTED   A.calcoaceticus-baumannii NOT DETECTED NOT DETECTED   Bacteroides fragilis NOT DETECTED NOT DETECTED   Enterobacterales NOT DETECTED NOT DETECTED   Enterobacter  cloacae complex NOT DETECTED NOT DETECTED   Escherichia coli NOT DETECTED NOT DETECTED   Klebsiella aerogenes NOT DETECTED NOT DETECTED   Klebsiella oxytoca NOT DETECTED NOT DETECTED   Klebsiella pneumoniae NOT DETECTED NOT DETECTED   Proteus species NOT DETECTED NOT DETECTED   Salmonella species NOT DETECTED NOT DETECTED   Serratia marcescens NOT DETECTED NOT DETECTED   Haemophilus influenzae NOT DETECTED NOT DETECTED   Neisseria meningitidis NOT DETECTED NOT DETECTED   Pseudomonas aeruginosa NOT DETECTED NOT DETECTED   Stenotrophomonas maltophilia NOT DETECTED NOT DETECTED   Candida albicans NOT DETECTED NOT DETECTED   Candida auris NOT DETECTED NOT DETECTED   Candida glabrata NOT DETECTED NOT DETECTED   Candida krusei NOT DETECTED NOT DETECTED   Candida parapsilosis NOT DETECTED NOT DETECTED   Candida tropicalis NOT DETECTED NOT DETECTED   Cryptococcus neoformans/gattii NOT DETECTED NOT DETECTED   Methicillin resistance mecA/C DETECTED (A) NOT DETECTED    Comment: CRITICAL RESULT CALLED TO, READ BACK BY AND VERIFIED WITH: MAYA LYNWOOD ORN  [id number] FCP Performed at [facility], [address] [place], [place] [address]   Glucose, capillary     Status: Abnormal   Collection Time: 01/16/24 12:44 PM  Result Value Ref Range   Glucose-Capillary 140 (H) 70 - 99 mg/dL    Comment: Glucose reference range applies only to samples taken after fasting for at least 8 hours.  Glucose, capillary     Status: Abnormal   Collection Time: 01/16/24  4:08 PM  Result Value Ref Range   Glucose-Capillary 138 (H) 70 - 99 mg/dL  Comment: Glucose reference range applies only to samples taken after fasting for at least 8 hours.  Glucose, capillary     Status: Abnormal   Collection Time: 01/16/24  8:03 PM  Result Value Ref Range   Glucose-Capillary 103 (H) 70 - 99 mg/dL    Comment: Glucose reference range applies only to samples taken after fasting for at least 8 hours.  Glucose, capillary      Status: Abnormal   Collection Time: 01/16/24  9:54 PM  Result Value Ref Range   Glucose-Capillary 118 (H) 70 - 99 mg/dL    Comment: Glucose reference range applies only to samples taken after fasting for at least 8 hours.  CBC with Differential/Platelet     Status: Abnormal   Collection Time: 01/17/24  6:23 AM  Result Value Ref Range   WBC 16.1 (H) 4.0 - 10.5 K/uL   RBC 2.97 (L) 4.22 - 5.81 MIL/uL   Hemoglobin 8.8 (L) 13.0 - 17.0 g/dL   HCT 73.3 (L) 60.9 - 47.9 %   MCV 89.6 80.0 - 100.0 fL   MCH 29.6 26.0 - 34.0 pg   MCHC 33.1 30.0 - 36.0 g/dL   RDW 83.8 (H) 88.4 - 84.4 %   Platelets 183 150 - 400 K/uL   nRBC 0.0 0.0 - 0.2 %   Neutrophils Relative % 81 %   Neutro Abs 13.0 (H) 1.7 - 7.7 K/uL   Lymphocytes Relative 7 %   Lymphs Abs 1.2 0.7 - 4.0 K/uL   Monocytes Relative 10 %   Monocytes Absolute 1.6 (H) 0.1 - 1.0 K/uL   Eosinophils Relative 1 %   Eosinophils Absolute 0.1 0.0 - 0.5 K/uL   Basophils Relative 0 %   Basophils Absolute 0.1 0.0 - 0.1 K/uL   Immature Granulocytes 1 %   Abs Immature Granulocytes 0.18 (H) 0.00 - 0.07 K/uL    Comment: Performed at [facility], [address], [place], [place] [address]  Comprehensive metabolic panel with GFR     Status: Abnormal   Collection Time: 01/17/24  6:23 AM  Result Value Ref Range   Sodium 133 (L) 135 - 145 mmol/L   Potassium 3.5 3.5 - 5.1 mmol/L   Chloride 95 (L) 98 - 111 mmol/L   CO2 28 22 - 32 mmol/L   Glucose, Bld 120 (H) 70 - 99 mg/dL    Comment: Glucose reference range applies only to samples taken after fasting for at least 8 hours.   BUN 18 6 - 20 mg/dL   Creatinine, Ser 6.36 (H) 0.61 - 1.24 mg/dL   Calcium  8.0 (L) 8.9 - 10.3 mg/dL   Total Protein 5.6 (L) 6.5 - 8.1 g/dL   Albumin  1.5 (L) 3.5 - 5.0 g/dL   AST 37 15 - 41 U/L   ALT 29 0 - 44 U/L   Alkaline Phosphatase 194 (H) 38 - 126 U/L   Total Bilirubin 2.5 (H) 0.0 - 1.2 mg/dL   GFR, Estimated 18 (L) >60 mL/min    Comment: (NOTE) Calculated using the  CKD-EPI Creatinine Equation (2021)    Anion gap 10 5 - 15    Comment: Performed at [facility], [address] [place], [place] [address]  Glucose, capillary     Status: Abnormal   Collection Time: 01/17/24  7:50 AM  Result Value Ref Range   Glucose-Capillary 130 (H) 70 - 99 mg/dL    Comment: Glucose reference range applies only to samples taken after fasting for  at least 8 hours.   DG Abd Portable 1V Result Date: 01/16/2024 EXAM: 1 VIEW XRAY OF THE ABDOMEN 01/16/2024 01:41:00 PM COMPARISON: None available. CLINICAL HISTORY: Biliary stent migration FINDINGS: BOWEL: Nonobstructive bowel gas pattern. The proximal portion of the biliary stent projects over the inferior portion of the T12 vertebra and the distal portion of the stent projects over the midline of the L2 vertebra. There is some rotational artifact with the stent in the projection of the spine. On the ERCP images, the proximal portion of the stent was at the superior aspect of the T12 level with the distal portion of the stent was over the L2 level. SOFT TISSUES: No opaque urinary calculi. Biliary stent in upper abdomen. Surgical chain sutures in left hemiabdomen. Partially imaged vascular catheter tip in right atrium. BONES: No acute osseous abnormality. IMPRESSION: 1. Slight rotational artifact with biliary stent projecting over the lower thoracic and upper lumbar spine. 2. Cannot exclude mild antegrade migration of the biliary stent. Biliary stent in upper abdomen with some rotational artifact, consistent with the reported migration. Electronically signed by: Waddell Calk MD 01/16/2024 02:08 PM EDT RP Workstation: [id number]   DG Chest Port 1 View Result Date: 01/16/2024 EXAM: 1 VIEW XRAY OF THE CHEST 01/16/2024 06:11:00 AM COMPARISON: 12/06/2023 CLINICAL HISTORY: SOB (shortness of breath) [id number]. Reason for exam: SOB FINDINGS: LUNGS AND PLEURA: Moderate right and small left pleural effusions with associated bibasilar opacities.  Mild pulmonary edema. HEART AND MEDIASTINUM: Mild cardiomegaly. BONES AND SOFT TISSUES: No acute osseous abnormality. LINES AND TUBES: Right IJ CVC in place with tip overlying proximal right atrium. Left chest port in place with catheter tip overlying right atrium. IMPRESSION: 1. Moderate right and small left pleural effusions with associated bibasilar opacities. 2. Mild pulmonary edema. 3. Mild cardiomegaly. Electronically signed by: Waddell Calk MD 01/16/2024 06:26 AM EDT RP Workstation: [id number]    PMH:   Past Medical History:  Diagnosis Date   Arthritis    Hip   CHF (congestive heart failure) (HCC)    Colon cancer (HCC)    Diabetic mononeuropathy associated with type 2 diabetes mellitus (HCC) 03/21/2017   Diabetic retinopathy (HCC) 07/31/2020   Enthesopathy of ankle and tarsus 04/02/2009   Formatting of this note might be different from the original. 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 Type 2 Diabetes Mellitus - Uncomplicated, Uncontrolled    PSH:   Past Surgical History:  Procedure Laterality Date   AMPUTATION Left 01/22/2018   Procedure: TRANSMETATARSAL AMPUTATION;  Surgeon: Harden Jerona GAILS, MD;  Location: [facility] OR;  Service: Orthopedics;  Laterality: Left;toes   BILIARY STENT  PLACEMENT N/A 05/10/2023   Procedure: BILIARY STENT PLACEMENT;  Surgeon: Rollin Dover, MD;  Location: [facility]OSCOPY;  Service: Gastroenterology;  Laterality: N/A;   BILIARY STENT PLACEMENT N/A 05/18/2023   Procedure:  BILIARY STENT PLACEMENT;  Surgeon: Rollin Dover, MD;  Location: [facility];  Service: Gastroenterology;  Laterality: N/A;   BILIARY STENT PLACEMENT  01/02/2024   Procedure: INSERTION, STENT, BILE DUCT;  Surgeon: Rollin Dover, MD;  Location: [facility] ENDOSCOPY;  Service: Gastroenterology;;   BIOPSY  07/27/2020   Procedure: BIOPSY;  Surgeon: Rollin Dover, MD;  Location: [facility];  Service: Endoscopy;;   COLON RESECTION N/A 07/30/2020   Procedure: HAND ASSISTED LAPAROSCOPIC LEFT HEMI COLECTOMY;  Surgeon: Gladis Cough, MD;  Location: [facility] ORS;  Service: General;  Laterality: N/A;   COLONOSCOPY WITH PROPOFOL  N/A 05/24/2018   Procedure: COLONOSCOPY WITH PROPOFOL ;  Surgeon: Rollin Dover, MD;  Location: [facility];  Service: Endoscopy;  Laterality: N/A;   COLONOSCOPY WITH PROPOFOL  N/A 07/27/2020   Procedure: COLONOSCOPY WITH PROPOFOL ;  Surgeon: Rollin Dover, MD;  Location: [facility];  Service: Endoscopy;  Laterality: N/A;   DIALYSIS/PERMA CATHETER INSERTION N/A 11/19/2023   Procedure: DIALYSIS/PERMA CATHETER INSERTION;  Surgeon: [name] [name], MD;  Location: [facility];  Service: Cardiovascular;  Laterality: N/A;   ERCP N/A 05/10/2023   Procedure: ENDOSCOPIC RETROGRADE CHOLANGIOPANCREATOGRAPHY (ERCP);  Surgeon: Rollin Dover, MD;  Location: [facility];  Service: Gastroenterology;  Laterality: N/A;   ERCP N/A 05/18/2023   Procedure: ENDOSCOPIC RETROGRADE CHOLANGIOPANCREATOGRAPHY (ERCP);  Surgeon: Rollin Dover, MD;  Location: [facility];  Service: Gastroenterology;  Laterality: N/A;   ERCP N/A 01/02/2024   Procedure: ERCP, WITH INTERVENTION IF INDICATED;  Surgeon: Rollin Dover, MD;  Location: [name] ENDOSCOPY;  Service: Gastroenterology;  Laterality: N/A;   ESOPHAGOGASTRODUODENOSCOPY  Left 04/05/2021   Procedure: ESOPHAGOGASTRODUODENOSCOPY (EGD);  Surgeon: Rollin Dover, MD;  Location: [facility];  Service: Endoscopy;  Laterality: Left;   ESOPHAGOGASTRODUODENOSCOPY N/A 05/10/2023   Procedure: ESOPHAGOGASTRODUODENOSCOPY (EGD);  Surgeon: Rollin Dover, MD;  Location: [facility];  Service: Gastroenterology;  Laterality: N/A;   ESOPHAGOGASTRODUODENOSCOPY N/A 01/15/2024   Procedure: EGD (ESOPHAGOGASTRODUODENOSCOPY);  Surgeon: Rollin Dover, MD;  Location: [facility] ENDOSCOPY;  Service: Gastroenterology;  Laterality: N/A;   EUS N/A 05/10/2023   Procedure: UPPER ENDOSCOPIC ULTRASOUND (EUS) LINEAR;  Surgeon: Rollin Dover, MD;  Location: [facility];  Service: Gastroenterology;  Laterality: N/A;   FINE NEEDLE ASPIRATION N/A 05/10/2023   Procedure: FINE NEEDLE ASPIRATION (FNA) LINEAR;  Surgeon: Rollin Dover, MD;  Location: [facility];  Service: Gastroenterology;  Laterality: N/A;   INSERTION OF ARTERIOVENOUS (AV) ARTEGRAFT ARM Right 12/06/2023   Procedure: INSERTION, GRAFT, ARTERIOVENOUS, UPPER EXTREMITY;  Surgeon: [name] [name], MD;  Location: [facility] OR;  Service: Vascular;  Laterality: Right;   INSERTION OF DIALYSIS CATHETER Right 12/06/2023   Procedure: EXCHANGE OF DIALYSIS CATHETER USING PALINDROME 23CM CATHETER KIT;  Surgeon: [name] [name], MD;  Location: [facility] OR;  Service: Vascular;  Laterality: Right;   POLYPECTOMY  05/24/2018   Procedure: POLYPECTOMY;  Surgeon: Rollin Dover, MD;  Location: [facility];  Service: Endoscopy;;   PORTACATH PLACEMENT Left 08/24/2020   Procedure: INSERTION PORT-A-CATH;  Surgeon: Gladis Cough, MD;  Location: [facility]S;  Service: General;  Laterality: Left;  75/rm1   SPHINCTEROTOMY  05/10/2023   Procedure: ANNETT;  Surgeon: Rollin Dover, MD;  Location: [facility];  Service: Gastroenterology;;   ANNETT  05/18/2023   Procedure: ANNETT;  Surgeon: Rollin Dover, MD;  Location: [facility];  Service: Gastroenterology;;   CLEDA  REMOVAL  05/18/2023   Procedure: STENT REMOVAL;  Surgeon: Rollin Dover, MD;  Location: [facility];  Service: Gastroenterology;;   CLEDA REMOVAL  01/02/2024   Procedure: STENT REMOVAL;  Surgeon: Rollin Dover, MD;  Location:  [facility]OS[facility];  Service: Gastroenterology;;   SUBMUCOSAL TATTOO INJECTION  07/27/2020   Procedure: SUBMUCOSAL TATTOO INJECTION;  Surgeon: Rollin Dover, MD;  Location: [facility];  Service: Endoscopy;;    Allergies:  Allergies  Allergen Reactions   Bee Venom Anaphylaxis, Swelling and Other (See Comments)    Cold Sweats, also   Latex Rash and Dermatitis    Medications:   Prior to Admission medications   Medication Sig Start Date End Date Taking? Authorizing Provider  amLODipine  (NORVASC ) 10 MG tablet Take 10 mg by mouth daily.   Yes [provider]  baclofen  (LIORESAL ) 10 MG tablet Take 10 mg by mouth daily as needed for muscle spasms.   Yes [provider]  cadexomer iodine  (IODOSORB) 0.9 % gel Apply 1 Application topically daily as needed for wound care.   Yes [provider]  carvedilol  (COREG ) 3.125 MG tablet Take 1 tablet (3.125 mg total) by mouth 2 (two) times daily with a meal. 01/03/24  Yes Dennise Lavada POUR, MD  EPINEPHrine  0.3 mg/0.3 mL IJ SOAJ injection Inject 0.3 mg into the muscle as needed for anaphylaxis.   Yes [provider]  ergocalciferol (VITAMIN D2) 1.25 MG (50000 UT) capsule Take 50,000 Units by mouth once a week. Take on Monday   Yes [provider]  ferrous sulfate 325 (65 FE) MG EC tablet Take 325 mg by mouth daily with breakfast.   Yes [provider]  furosemide  (LASIX ) 20 MG tablet Take 20 mg by mouth.   Yes [provider]  LANTUS  SOLOSTAR 100 UNIT/ML Solostar Pen Inject 10-15 Units into the skin See admin instructions. Inject 15 units into the skin in the morning and 10 units into the skin at bedtime   Yes [provider]  loperamide (IMODIUM) 2 MG capsule Take 2 mg by  mouth daily as needed for diarrhea or loose stools.   Yes [provider]  losartan (COZAAR) 50 MG tablet Take 50 mg by mouth daily. 01/11/24  Yes [provider]  mirtazapine  (REMERON ) 15 MG tablet Take 7.5 mg by mouth at bedtime. 12/26/23  Yes [provider]  Nutritional Supplements (FEEDING SUPPLEMENT, NEPRO CARB STEADY,) LIQD Take 237 mLs by mouth daily. Mixed berry   Yes [provider]  Sodium Chloride , GU Irrigant, (0.9 % SODIUM CHLORIDE , POUR BTL, OPTIME) Irrigate with 1,000 mLs as directed daily.   Yes [provider]    Discontinued Meds:   Medications Discontinued During This Encounter  Medication Reason   ACCU-CHEK GUIDE test strip    Continuous Glucose Sensor (DEXCOM G6 SENSOR) MISC    Continuous Glucose Transmitter (DEXCOM G6 TRANSMITTER) MISC    oxyCODONE  (ROXICODONE ) 5 MG immediate release tablet Completed Course   pantoprazole  (PROTONIX ) 40 MG tablet Completed Course   prochlorperazine  (COMPAZINE ) 10 MG tablet Change in therapy   calcitRIOL  (ROCALTROL ) 0.25 MCG capsule Change in therapy   0.9 %  sodium chloride  infusion Patient Transfer   insulin  aspart (novoLOG ) injection 0-6 Units    pentafluoroprop-tetrafluoroeth (GEBAUERS) aerosol 1 Application Patient Transfer   lidocaine  (PF) (XYLOCAINE ) 1 % injection 5 mL Patient Transfer   lidocaine -prilocaine  (EMLA ) cream 1 Application Patient Transfer   heparin  injection 1,000 Units Patient Transfer   anticoagulant sodium citrate  solution 5 mL Patient Transfer   alteplase  (CATHFLO ACTIVASE ) injection 2 mg Patient Transfer    Social History:  reports that he has never smoked. He has never used smokeless tobacco. He reports current alcohol use of about 1.0 standard  drink of alcohol per week. He reports that he does not use drugs.  Family History:   Family History  Problem Relation Age of Onset   Hypertension Father     Blood pressure (!) 126/54, pulse 74, temperature 99 F (37.2  C), temperature source Oral, resp. rate 17, height 6' 1 (1.854 m), weight 73.3 kg, SpO2 98%. Physical Exam: GENERAL:  Lethargic but easily arousable, pleasant, NAD  HEENT:  EOMI CARDIOVASCULAR:  RRR, no murmurs appreciated RESPIRATORY:  Clear to auscultation GASTROINTESTINAL:  Soft, nontender, nondistended EXTREMITIES:  No LE edema bilaterally NEURO:  No new focal deficits appreciated SKIN:  No rashes noted PSYCH:  Appropriate mood and affect Dialysis access: TDC, right upper arm AV graft with a very nice thrill ready to be used on 01/17/2024 (placed 12/06/2023)     MELIA LYNWOOD ORN, MD 01/17/2024, 8:39 AM

## 2024-01-17 NOTE — Progress Notes (Signed)
 IP PROGRESS NOTE  Subjective:   Jeffrey Young is well-known to me with a history of metastatic colon cancer.  He is currently maintained off of treatment for colon cancer.  He recently started hemodialysis.  I saw him 01/10/2024.  He had a poor performance status and received a Red cell transfusion for severe anemia.  Jeffrey Young was referred to the emergency room from the dialysis center on 01/14/2024 with anemia and hematemesis.  The hemoglobin returned at 6.6 on 01/15/2024.  He received a Red cell transfusion.  Dr. Rollin Stanley and He Was Taken to an Upper Endoscopy 01/15/2024.  A nonbleeding gastric ulcer with adherent clot was biopsied and injected.  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  He developed a fever in the hospital.  A blood culture returned positive for a methicillin-resistant staph epidermidis.  He continues to have a fever.  Jeffrey Young had no specific complaint when I saw him at approximately 6:15 AM.   Objective: Vital signs in last 24 hours: Blood pressure (!) 137/58, pulse 77, temperature 97.8 F (36.6 C), temperature source Oral, resp. rate 18, height 6' 1 (1.854 m), weight 161 lb 9.6 oz (73.3 kg), SpO2 98%.  Intake/Output from previous day: 08/27 0701 - 08/28 0700 In: 793 [P.O.:240; I.V.:3; IV Piggyback:550] Out: 2000   Physical Exam:  HEENT: No thrush Lungs: Clear bilaterally Cardiac: Regular rate and rhythm Abdomen: Soft, masslike fullness in the right mid abdomen, nontender Extremities: No leg edema  Portacath and dialysis catheter without erythema  Lab Results: Recent Labs    01/15/24 1306 01/16/24 0532  WBC 15.9* 13.8*  HGB 9.2*  9.3* 8.5*  HCT 27.4*  27.8* 24.8*  PLT 190 165    BMET Recent Labs    01/15/24 0403 01/16/24 0532  NA 130* 128*  K 3.1* 2.9*  CL 93* 92*  CO2 27 28  GLUCOSE 145* 237*  BUN 14 21*  CREATININE 3.32* 4.48*  CALCIUM  7.8* 7.8*    Lab Results  Component Value Date   CEA1 140.0  (H) 01/02/2024   CEA 111.26 (H) 01/10/2024   CAN199 1,452 (H) 01/02/2024    Studies/Results: DG Abd Portable 1V Result Date: 01/16/2024 EXAM: 1 VIEW XRAY OF THE ABDOMEN 01/16/2024 01:41:00 PM COMPARISON: None available. CLINICAL HISTORY: Biliary stent migration FINDINGS: BOWEL: Nonobstructive bowel gas pattern. The proximal portion of the biliary stent projects over the inferior portion of the T12 vertebra and the distal portion of the stent projects over the midline of the L2 vertebra. There is some rotational artifact with the stent in the projection of the spine. On the ERCP images, the proximal portion of the stent was at the superior aspect of the T12 level with the distal portion of the stent was over the L2 level. SOFT TISSUES: No opaque urinary calculi. Biliary stent in upper abdomen. Surgical chain sutures in left hemiabdomen. Partially imaged vascular catheter tip in right atrium. BONES: No acute osseous abnormality. IMPRESSION: 1. Slight rotational artifact with biliary stent projecting over the lower thoracic and upper lumbar spine. 2. Cannot exclude mild antegrade migration of the biliary stent. Biliary stent in upper abdomen with some rotational artifact, consistent with the reported migration. Electronically signed by: Waddell Calk MD 01/16/2024 02:08 PM EDT RP Workstation: [id number]   DG Chest Port 1 View Result Date: 01/16/2024 EXAM: 1 VIEW XRAY OF THE CHEST 01/16/2024 06:11:00 AM COMPARISON: 12/06/2023 CLINICAL HISTORY: SOB (shortness of breath) [id number]. Reason for exam: SOB FINDINGS: LUNGS AND PLEURA: Moderate  right and small left pleural effusions with associated bibasilar opacities. Mild pulmonary edema. HEART AND MEDIASTINUM: Mild cardiomegaly. BONES AND SOFT TISSUES: No acute osseous abnormality. LINES AND TUBES: Right IJ CVC in place with tip overlying proximal right atrium. Left chest port in place with catheter tip overlying right atrium. IMPRESSION: 1. Moderate right and small  left pleural effusions with associated bibasilar opacities. 2. Mild pulmonary edema. 3. Mild cardiomegaly. Electronically signed by: Waddell Calk MD 01/16/2024 06:26 AM EDT RP Workstation: [id number]    Medications: I have reviewed the patient's current medications.  Assessment/Plan:  Descending colon cancer, stage IIIc (T3N2b M0), status post a partial left colectomy 07/30/2020, 9/16 lymph nodes positive, lymphovascular invasion, 1 satellite nodule, negative margins, MSS, no loss of mismatch repair protein expression; foundation 1 K-ras wild-type, NRAS Q61H, microsatellite stable, tumor mutation burden 4. -History of large polyp in the left side of the colon-referred to [facility] in 05/2018 for procedure canceled secondary to COVID-19 pandemic.  Procedure was not rescheduled. -CT chest/abdomen/pelvis with contrast 07/27/2020-3 small pulmonary nodules less than 5 mm favored to be benign, circumferential luminal narrowing of the distal transverse colon concerning for malignancy, no metastatic adenopathy in the mesentery porta hepatis, no for metastasis. -CEA on 07/27/2020 was 17.3; 37 on 08/30/2020; 33 on 09/13/2020 -Colonoscopy performed 07/27/2020 showed a fungating, infiltrative and ulcerated nonobstructing large mass in the proximal descending colon.  Biopsy-adenocarcinoma -Cycle 1 FOLFOX 08/30/2020 -Cycle 2 FOLFOX 09/13/2020, Emend  added for delayed nausea -Cycle 3 FOLFOX 09/27/2020 -Cycle 4 FOLFOX 10/11/2020 -Cycle 5 FOLFOX 11/08/2020 -Cycle 6 FOLFOX 11/23/2020 -CT 12/03/2020-prior 3 mm left apical nodule no longer seen, no new/suspicious pulmonary nodules, no evidence of metastatic disease -Cycle 7 FOLFOX 12/06/2020 -Cycle 8 FOLFOX 12/21/2020 -Cycle 9 FOLFOX 01/03/2021 -Cycle 10 FOLFOX 01/17/2021 -Cycle 11 FOLFOX 01/31/2021, oxaliplatin  held secondary to neuropathy symptoms -Cycle 12 FOLFOX 02/15/2021, oxaliplatin  held secondary to neuropathy -CT abdomen/pelvis 04/02/2021-no evidence of recurrent colon cancer -CT  chest 04/08/2021-no evidence of metastatic disease -Elevated CEA February and March 2023 -08/17/2021 PET scan-no evidence of local recurrence or metastasis -09/26/2021-Guardant-ctDNA detected -Colonoscopy 10/04/2021-negative -CT 11/17/2021-no evidence of metastatic disease -CTs 01/08/2022-no evidence of metastatic disease -PET scan 08/10/2022-several newly enlarged lymph nodes with moderate metabolic activity -CT 11/06/2022-stable left supraclavicular and retroperitoneal nodes, mild progression of pelvic adenopathy, new 6 mm segment 2 liver lesion -CT 03/05/2023-increase size of left supraclavicular node and retroperitoneal nodes, increased size of gastropathic and left iliac side chain lymph nodes, stable subtle hypoattenuating lesions at segment 2 in the hepatic dome -CT abdomen/pelvis 05/07/2023-new intrahepatic and extrahepatic biliary duct dilatation with possible associated periportal edema.  Common duct dilated up to 15 mm diameter, abruptly terminates prior to entering the head of the pancreas.  Distended gallbladder with possible gallbladder wall thickening and pericholecystic edema.  No substantial change in lymphadenopathy of the gastrohepatic ligament, hepatoduodenal ligament and left external iliac chain. 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  Moderate diffuse intrahepatic and proximal extrahepatic biliary duct dilatation.  Mild gallbladder distention with moderate diffuse gallbladder wall thickening.  Minimal pericholecystic fluid and fat stranding.  Solitary 1.1 cm anterior segment 2 left liver mass.  Multiple mildly enlarged porta hepatis and portacaval nodes. -Biopsy perihepatic lymph node 05/10/2023-malignant cells, adenocarcinoma -CTs 07/02/2023-persistent biliary duct dilatation with indwelling stent.  Persistent wall thickening of the gallbladder.  Subtle areas of  nodal enlargement identified in the abdomen and pelvis, similar to recent  prior examinations.  Enlarged node in the supraclavicular region appears smaller.  No new thoracic nodes.  Mild areas of groundglass in the upper lobes of the lungs bilaterally, nonspecific. - CTs 10/13/2023: Interval growth of a periportal node, nonpathologically enlarged left supraclavicular retroperitoneal nodes, persistent mild intra Paddock biliary duct dilatation with common bile duct stents in place -CT abdomen/pelvis 01/01/2024: Vague 1.4 cm mass bulging the anterior contour of segment 3, intra P[place]ock and exophytic bile duct dilation-increased, distended gallbladder, mass anterior to the superior margin of the pancreas body, enlarged gastropathic ligament and porta hepatis nodes, small pleural effusions 2.  Anemia due to GI bleeding, iron deficiency?,  Renal insufficiency? 3.  New onset acute diastolic CHF March 2022 4.  Diabetes mellitus 5.  Renal failure-dialysis catheter placed 11/19/2023.  Dialysis scheduled to begin 11/21/2023 Monday Wednesday Friday schedule. 6.  Hypertension 7.  History of left transmetatarsal amputation 8.  History of colon polyps 9.  Neuropathy 10.  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  Admission with obstructive jaundice 05/07/2023 - 05/22/2023.  Upper EUS 05/10/2023-lymph node visualized and measured in the porta hepatis region; FNA showed malignant cells, adenocarcinoma.  ERCP 05/10/2023-biliary tract obstruction secondary to a mass in the common bile duct, plastic stent placed into the common bile duct.  ERCP 05/18/2023-visibly patent stent from the common bile duct was seen in the major papilla.  Stent removed from the common bile duct.  2 plastic stents placed into the common bile duct. 12/31/2023 nausea/jaundice: ERCP 01/02/2024-occlusion of plastic bile duct stents-removed, an uncovered  metal stent was placed, a single stricture was found in the common bile duct, clot coming from the bile duct 13.  Admission 12/31/2023 with hematemesis and severe anemia, EGD 01/02/2024: No evidence of a bleeding source in the upper GI tract 14.  Admission 01/14/2024 with severe anemia and hematemesis EGD 01/15/2024: Nonbleeding gastric ulcer with adherent clot, biopsy-adenocarcinoma 1 unit packed red blood cells 01/15/2024 15.  Fever-positive blood culture for staph epidermidis     Jeffrey Young has metastatic colon cancer.  He has not been treated for the metastatic colon cancer.  He has multiple comorbid conditions including dialysis dependent renal failure, biliary obstruction, and severe anemia.  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  He has developed high fever over the past few days.  The source for infection has not been identified.  A single blood culture returned positive for Staph epidermidis, potentially a contaminant.  The source for fever/infection could be the hemodialysis catheter, Port-A-Cath, biliary stent, aspiration, left heel ulcer, or tumor.  The hemoglobin is stable following a Red cell transfusion 01/15/2024.  I discussed the difficult situation with Jeffrey Young.  He is not a candidate for systemic chemotherapy unless his performance status improves and the bilirubin is lower.  He is being followed by gastroenterology, urology, infectious disease, and the medical service.  I plan to meet with his wife in the morning for further discussion.  Recommendations:  Follow hemoglobin, transfuse packed red blood cells as needed, continue antiacid therapy Hemodialysis per nephrology Evaluation/management of the fever per infectious disease and the medical service Continue goals of care discussions with Jeffrey Young and his wife I will check on him 01/18/2024, patient follow-up will be scheduled at the Cancer center, please call oncology as needed  LOS: 1  day   Arley Hof, MD   01/17/2024, 7:06 AM

## 2024-01-17 NOTE — Progress Notes (Signed)
 PHARMACY - PHYSICIAN COMMUNICATION CRITICAL VALUE ALERT - BLOOD CULTURE IDENTIFICATION (BCID)  Jeffrey Young is an 58 y.o. male who presented to [facility] on 01/14/2024 with a chief complaint of acute upper GI bleed and worsening anemia, now with fever with unclear etiology due to possible aspiration  Assessment:  2/4 bottles (same set) staph epi with mecA gene detected  -WBC 15.9 > 13.8, Tmax 102.3 (8/27 ~2300)  Name of physician (or Provider) Contacted: Dr. Alfornia  Current antibiotics: Unasyn  1.5mg  IV every 12 hours   Changes to prescribed antibiotics recommended:   -Add Vancomycin  1750mg  IV x1, then Vancomycin  750mg  after every HD session  Results for orders placed or performed during the hospital encounter of 01/14/24  Blood Culture ID Panel (Reflexed) (Collected: 01/16/2024  7:42 AM)  Result Value Ref Range   Enterococcus faecalis NOT DETECTED NOT DETECTED   Enterococcus Faecium NOT DETECTED NOT DETECTED   Listeria monocytogenes NOT DETECTED NOT DETECTED   Staphylococcus species DETECTED (A) NOT DETECTED   Staphylococcus aureus (BCID) NOT DETECTED NOT DETECTED   Staphylococcus epidermidis DETECTED (A) NOT DETECTED   Staphylococcus lugdunensis NOT DETECTED NOT DETECTED   Streptococcus species NOT DETECTED NOT DETECTED   Streptococcus agalactiae NOT DETECTED NOT DETECTED   Streptococcus pneumoniae NOT DETECTED NOT DETECTED   Streptococcus pyogenes NOT DETECTED NOT DETECTED   A.calcoaceticus-baumannii NOT DETECTED NOT DETECTED   Bacteroides fragilis NOT DETECTED NOT DETECTED   Enterobacterales NOT DETECTED NOT DETECTED   Enterobacter cloacae complex NOT DETECTED NOT DETECTED   Escherichia coli NOT DETECTED NOT DETECTED   Klebsiella aerogenes NOT DETECTED NOT DETECTED   Klebsiella oxytoca NOT DETECTED NOT DETECTED   Klebsiella pneumoniae NOT DETECTED NOT DETECTED   Proteus species NOT DETECTED NOT DETECTED   Salmonella species NOT DETECTED NOT DETECTED   Serratia  marcescens NOT DETECTED NOT DETECTED   Haemophilus influenzae NOT DETECTED NOT DETECTED   Neisseria meningitidis NOT DETECTED NOT DETECTED   Pseudomonas aeruginosa NOT DETECTED NOT DETECTED   Stenotrophomonas maltophilia NOT DETECTED NOT DETECTED   Candida albicans NOT DETECTED NOT DETECTED   Candida auris NOT DETECTED NOT DETECTED   Candida glabrata NOT DETECTED NOT DETECTED   Candida krusei NOT DETECTED NOT DETECTED   Candida parapsilosis NOT DETECTED NOT DETECTED   Candida tropicalis NOT DETECTED NOT DETECTED   Cryptococcus neoformans/gattii NOT DETECTED NOT DETECTED   Methicillin resistance mecA/C DETECTED (A) NOT DETECTED    Lynwood Poplar, PharmD, BCPS Clinical Pharmacist 01/17/2024 1:58 AM

## 2024-01-17 NOTE — Evaluation (Addendum)
 Clinical/Bedside Swallow Evaluation Patient Details  Name: Jeffrey Young MRN: [id number] Date of Birth: [birth year]  Today's Date: 01/17/2024 Time: SLP Start Time (ACUTE ONLY): 1128 SLP Stop Time (ACUTE ONLY): 1148 SLP Time Calculation (min) (ACUTE ONLY): 20 min  Past Medical History:  Past Medical History:  Diagnosis Date   Arthritis    Hip   CHF (congestive heart failure) (HCC)    Colon cancer (HCC)    Diabetic mononeuropathy associated with type 2 diabetes mellitus (HCC) 03/21/2017   Diabetic retinopathy (HCC) 07/31/2020   Enthesopathy of ankle and tarsus 04/02/2009   Formatting of this note might be different from the original. 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 Type 2 Diabetes Mellitus - Uncomplicated, Uncontrolled   Past Surgical History:  Past Surgical History:  Procedure Laterality Date   AMPUTATION Left 01/22/2018   Procedure: TRANSMETATARSAL AMPUTATION;  Surgeon: Harden Jerona GAILS, MD;  Location: [facility] OR;  Service: Orthopedics;  Laterality: Left;toes   BILIARY STENT PLACEMENT N/A 05/10/2023   Procedure: BILIARY STENT PLACEMENT;  Surgeon: Rollin Dover, MD;  Location: [facility] ENDOSCOPY;  Service: Gastroenterology;  Laterality: N/A;   BILIARY STENT PLACEMENT N/A 05/18/2023    Procedure: BILIARY STENT PLACEMENT;  Surgeon: Rollin Dover, MD;  Location: [facility];  Service: Gastroenterology;  Laterality: N/A;   BILIARY STENT PLACEMENT  01/02/2024   Procedure: INSERTION, STENT, BILE DUCT;  Surgeon: Rollin Dover, MD;  Location: [facility] ENDOSCOPY;  Service: Gastroenterology;;   BIOPSY  07/27/2020   Procedure: BIOPSY;  Surgeon: Rollin Dover, MD;  Location: [facility];  Service: Endoscopy;;   COLON RESECTION N/A 07/30/2020   Procedure: HAND ASSISTED LAPAROSCOPIC LEFT HEMI COLECTOMY;  Surgeon: Gladis Cough, MD;  Location: [facility] ORS;  Service: General;  Laterality: N/A;   COLONOSCOPY WITH PROPOFOL  N/A 05/24/2018   Procedure: COLONOSCOPY WITH PROPOFOL ;  Surgeon: Rollin Dover, MD;  Location: [facility];  Service: Endoscopy;  Laterality: N/A;   COLONOSCOPY WITH PROPOFOL  N/A 07/27/2020   Procedure: COLONOSCOPY WITH PROPOFOL ;  Surgeon: Rollin Dover, MD;  Location: [facility];  Service: Endoscopy;  Laterality: N/A;   DIALYSIS/PERMA CATHETER INSERTION N/A 11/19/2023   Procedure: DIALYSIS/PERMA CATHETER INSERTION;  Surgeon: [name] [name], MD;  Location: [facility];  Service: Cardiovascular;  Laterality: N/A;   ERCP N/A 05/10/2023   Procedure: ENDOSCOPIC RETROGRADE CHOLANGIOPANCREATOGRAPHY (ERCP);  Surgeon: Rollin Dover, MD;  Location: [facility];  Service: Gastroenterology;  Laterality: N/A;   ERCP N/A 05/18/2023   Procedure: ENDOSCOPIC RETROGRADE CHOLANGIOPANCREATOGRAPHY (ERCP);  Surgeon: Rollin Dover, MD;  Location: [facility];  Service: Gastroenterology;  Laterality: N/A;   ERCP N/A 01/02/2024   Procedure: ERCP, WITH INTERVENTION IF INDICATED;  Surgeon: Rollin Dover, MD;  Location: [facility] ENDOSCOPY;  Service: Gastroenterology;  Laterality: N/A;   ESOPHAGOGASTRODUODENOSCOPY Left 04/05/2021   Procedure: ESOPHAGOGASTRODUODENOSCOPY (EGD);  Surgeon: Rollin Dover, MD;  Location: [facility];  Service: Endoscopy;  Laterality: Left;   ESOPHAGOGASTRODUODENOSCOPY N/A 05/10/2023    Procedure: ESOPHAGOGASTRODUODENOSCOPY (EGD);  Surgeon: Rollin Dover, MD;  Location: [facility];  Service: Gastroenterology;  Laterality: N/A;   ESOPHAGOGASTRODUODENOSCOPY N/A 01/15/2024   Procedure: EGD (ESOPHAGOGASTRODUODENOSCOPY);  Surgeon: Rollin Dover, MD;  Location: [facility] ENDOSCOPY;  Service: Gastroenterology;  Laterality: N/A;   EUS N/A 05/10/2023   Procedure: UPPER ENDOSCOPIC ULTRASOUND (EUS) LINEAR;  Surgeon: Rollin Dover, MD;  Location: [facility];  Service: Gastroenterology;  Laterality: N/A;   FINE NEEDLE ASPIRATION N/A 05/10/2023   Procedure: FINE NEEDLE ASPIRATION (FNA) LINEAR;  Surgeon: Rollin Dover, MD;  Location: [facility];  Service: Gastroenterology;  Laterality: N/A;   INSERTION OF ARTERIOVENOUS (AV) ARTEGRAFT ARM Right 12/06/2023   Procedure: INSERTION, GRAFT, ARTERIOVENOUS, UPPER EXTREMITY;  Surgeon: [name] [name], MD;  Location: [facility] OR;  Service: Vascular;  Laterality: Right;   INSERTION OF DIALYSIS CATHETER Right 12/06/2023   Procedure: EXCHANGE OF DIALYSIS CATHETER USING PALINDROME 23CM CATHETER KIT;  Surgeon: [name] [name], MD;  Location: [facility] OR;  Service: Vascular;  Laterality: Right;   POLYPECTOMY  05/24/2018   Procedure: POLYPECTOMY;  Surgeon: Rollin Dover, MD;  Location: [facility];  Service: Endoscopy;;   PORTACATH PLACEMENT Left 08/24/2020   Procedure: INSERTION PORT-A-CATH;  Surgeon: Gladis Cough, MD;  Location: [facility]S;  Service: General;  Laterality: Left;  75/rm1   SPHINCTEROTOMY  05/10/2023   Procedure: ANNETT;  Surgeon: Rollin Dover, MD;  Location: [facility];  Service: Gastroenterology;;   ANNETT  05/18/2023   Procedure: ANNETT;  Surgeon: Rollin Dover, MD;  Location: [facility];  Service: Gastroenterology;;   CLEDA REMOVAL  05/18/2023   Procedure: STENT REMOVAL;  Surgeon: Rollin Dover, MD;  Location: [facility];  Service: Gastroenterology;;   CLEDA REMOVAL  01/02/2024   Procedure: STENT REMOVAL;  Surgeon: Rollin Dover, MD;  Location: [facility] ENDOSCOPY;  Service: Gastroenterology;;   SUBMUCOSAL TATTOO INJECTION  07/27/2020   Procedure: SUBMUCOSAL TATTOO INJECTION;  Surgeon: Rollin Dover, MD;  Location: [facility];  Service: Endoscopy;;   HPI:  Patient is a 58 y.o. 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 GI following. SLP ordered as patient likely aspirated on 8/27 when he threw up some blood.    Assessment / Plan / Recommendation  Clinical Impression  Patient was sitting in bed while nurse was checking blood pressure when SLP entered. Prior to POs patient was starting to experience some nausea. Patient denied wanting water and requested ginger ale. When given ginger ale, patient drank independently from straw without any s/s aspiration. Patient enjoyed the apple sauce and had no s/s aspiration. Patient did not want graham crackers since he, didn't have his teeth. Patient requested the apple sauce and graham crackers be left with him so he could eat after SLP leaves. Patient shared that he was starting to feel depressed and SLP offered to have someone come talk to him. Patient denied. Recommended to continue on regular with thin liquids. SLP will sign off at this time. SLP Visit Diagnosis: Dysphagia, unspecified (R13.10)    Aspiration Risk       Diet Recommendation Regular;Thin liquid    Liquid Administration via: Straw Medication Administration: Other (Comment) (as tolerated) Supervision: Patient able to self feed Postural Changes: Seated upright at 90 degrees;Remain upright for at least 30 minutes after  po intake    Other  Recommendations Oral Care Recommendations: Oral care BID     Assistance Recommended at Discharge    Functional Status Assessment Patient has not had a recent decline  in their functional status  Frequency and Duration            Prognosis Prognosis for improved oropharyngeal function: Good      Swallow Study   General Date of Onset: 01/17/24 HPI: Patient is a 58 y.o. 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 GI following. SLP ordered as patient likely aspirated on 8/27 when he threw up some blood. Type of Study: Bedside Swallow Evaluation Diet Prior to this Study: Regular;Thin liquids (Level 0) Temperature Spikes Noted: No Respiratory Status: Room air History of Recent Intubation: Yes Total duration of intubation (days): 1 days Date extubated: 01/02/24 Behavior/Cognition: Alert;Cooperative Oral Cavity Assessment: Within Functional Limits Oral Care Completed by SLP: No Oral Cavity - Dentition: Missing dentition;Other (Comment) (patient shared that he was missing his dentures) Vision: Functional for self-feeding Self-Feeding Abilities: Able to feed self Patient Positioning: Upright in bed Baseline Vocal Quality: Normal    Oral/Motor/Sensory Function Overall Oral Motor/Sensory Function: Within functional limits   Ice Chips     Thin Liquid Thin Liquid: Within functional limits Presentation: Straw    Nectar Thick     Honey Thick     Puree Puree: Within functional limits Presentation: Spoon   Solid     Solid: Not tested      Damien Hy  Graduate SLP Clinican

## 2024-01-17 NOTE — Plan of Care (Signed)

## 2024-01-17 NOTE — Consult Note (Addendum)
 [facility]    Date of Admission:  01/14/2024     Reason for Consult: sepsis/bacteremia    Referring Provider: Dennise Salles     Lines:  Right sided hd cath Chemo port avf  Abx: 8/28-c ceftriaxone  8/28-c flagyl  8/28-c vanc   8/25-28 amp-sulb        Assessment: 58 yo male untreated metastatic colon cancer with biliary obstruction s/p stent via ercp on 01/02/24, dm2, esrd on iHD admitted with a few days nausea/malaise found to have initial one of 2 set mrs bcx positive     8/27 bcx 1 set mrse 8/28 bcx repeat ngtd 8/28 peripheral blood cx repeat by nephrology in process 8/28 bcx via hd line by nephrology in process   Discussed plan with dr Dennise, dr Deanne oncology, and dr Melia  Patient nonspecific malaise/nausea but no localizing sx Cxr slight volume overload; no frank sign of pneumonia otherwise  Unclear what to make of the mrse one of 2 sets initial cx yet. A dose of vanc was given prior to any repeat bcx being drawn   Potential line infection of hd catheter vs port vs endocarditis (latter less likely) Baseline moderate lft elevate from biliary carcinoma (has biliary stent) -- nontender in that area possible but low suspicion for biliary sepsis   I reviewed gi note doesn't appear to have concern for biliary sepsis   ?viral syndrome  ?from cancer ?dvt   Plan: Broaden abx to gi coverage with ceftriaxone /flagyl ; continue vanc Resp viral pcr Appreciate nephrology help to draw bcx from hd line and peripheral site. Per nephrology avf functioning so HD line can be removed in a few days Repeat bcx pending Tomorrow if blood cx negative otherwise besides the mrse then would stop ceftriaxone  flagyl  and continue just vancomycin  for 2 weeks If persistent fever would do duplex u/s extremity and maybe chest cta to r/o dvt/pe as cause of fever If blood cx positive again would get full IE workup and will need to consider removing  port as well If blood cx negative and we have treated 2 weeks for mrse and then relapse would in that case do IE workup and remove the port Maintain droplet precaution for now pending respiratory viral pcr testing, then if negative standard isolation precaution Discussed with primary team     ------------------------------------------------ Principal Problem:   Hematemesis Active Problems:   Acute on chronic anemia   DM2 (diabetes mellitus, type 2) (HCC)   Malignant neoplasm of colon (HCC)   End-stage renal disease on hemodialysis (HCC)    HPI: Jeffrey Young is a 58 y.o. male untreated metastatic colon cancer with biliary obstruction s/p stent via ercp on 01/02/24, dm2, esrd on iHD admitted with a few days nausea/malaise found to have initial one of 2 set mrs bcx positive    Patient had a few days acute onset n/v No abd pain Lft rise stable  He came to the hospital with malaise and also anemia  Found to also have fever with 1 of 2 set with mrse  Cxr pulm edema; no resp sx No rash No travel No sick contact No gu sx No diarrhea/abd pain  On amp-sulb and vanc  N/v improved  Gi and nephrology input noted  Family History  Problem Relation Age of Onset   Hypertension Father     Social History   Tobacco Use   Smoking status: Never   Smokeless tobacco: Never  Vaping  Use   Vaping status: Never Used  Substance Use Topics   Alcohol use: Yes    Alcohol/week: 1.0 standard drink of alcohol    Types: 1 Cans of beer per week    Comment: occasional   Drug use: Never    Allergies  Allergen Reactions   Bee Venom Anaphylaxis, Swelling and Other (See Comments)    Cold Sweats, also   Latex Rash and Dermatitis    Review of Systems: ROS All Other ROS was negative, except mentioned above   Past Medical History:  Diagnosis Date   Arthritis    Hip   CHF (congestive heart failure) (HCC)    Colon cancer (HCC)    Diabetic mononeuropathy associated with type 2  diabetes mellitus (HCC) 03/21/2017   Diabetic retinopathy (HCC) 07/31/2020   Enthesopathy of ankle and tarsus 04/02/2009   Formatting of this note might be different from the original. 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 Type 2 Diabetes Mellitus - Uncomplicated, Uncontrolled       Scheduled Meds:  sodium chloride    Intravenous Once   calcitRIOL   0.25 mcg Oral Daily   Chlorhexidine  Gluconate Cloth  6 each Topical Q0600   insulin  aspart  0-6 Units Subcutaneous TID WC   metroNIDAZOLE   500 mg Oral Q12H   mirtazapine   7.5 mg Oral Daily   pantoprazole  (PROTONIX ) IV  40 mg Intravenous Q12H   sodium chloride  flush  3 mL Intravenous Q12H   Continuous Infusions:  cefTRIAXone  (ROCEPHIN )  IV     [START ON 01/18/2024] vancomycin      PRN Meds:.acetaminophen  **OR** acetaminophen , chlorproMAZINE , fentaNYL  (SUBLIMAZE ) injection, oxyCODONE , prochlorperazine    OBJECTIVE: Blood pressure (!) 126/54, pulse 74, temperature 99 F (37.2 C), temperature source Oral, resp. rate 17, height 6' 1 (1.854 m), weight 73.3 kg, SpO2 98%.  Physical Exam  General/constitutional: no distress, pleasant HEENT: Normocephalic, PER, Conj Clear, EOMI, Oropharynx clear Neck supple CV: rrr no mrg Lungs: clear  to auscultation, normal respiratory effort Abd: Soft, Nontender Ext: no edema Skin: No Rash Neuro: nonfocal MSK: no peripheral joint swelling/tenderness/warmth; back spines nontender   Central line presence: right chest hd catheter site no purulence/tenderness; implanted chest port area nontender left chest; right upper arm avf area nontender   Lab Results Lab Results  Component Value Date   WBC 16.1 (H) 01/17/2024   HGB 8.8 (L) 01/17/2024   HCT 26.6 (L) 01/17/2024   MCV 89.6 01/17/2024   PLT 183 01/17/2024    Lab Results  Component Value Date   CREATININE 3.63 (H) 01/17/2024   BUN 18 01/17/2024   NA 133 (L) 01/17/2024   K 3.5 01/17/2024   CL 95 (L) 01/17/2024   CO2 28 01/17/2024    Lab Results  Component Value Date   ALT 29 01/17/2024   AST 37  01/17/2024   GGT 196 (H) 01/01/2024   ALKPHOS 194 (H) 01/17/2024   BILITOT 2.5 (H) 01/17/2024      Microbiology: Recent Results (from the past 240 hours)  Culture, blood (Routine X 2) w Reflex to ID Panel     Status: None (Preliminary result)   Collection Time: 01/16/24  7:42 AM   Specimen: BLOOD LEFT ARM  Result Value Ref Range Status   Specimen Description BLOOD LEFT ARM  Final   Special Requests   Final    BOTTLES DRAWN AEROBIC AND ANAEROBIC Blood Culture adequate volume   Culture  Setup Time   Final    GRAM POSITIVE COCCI IN CLUSTERS IN BOTH AEROBIC AND ANAEROBIC BOTTLES CRITICAL RESULT CALLED TO, READ BACK BY AND VERIFIED WITH: MAYA LYNWOOD ORN  [id number] FCP Performed at [facility], [address], [place], [place] [address]    Culture GRAM POSITIVE COCCI  Final   Report Status PENDING  Incomplete  Blood Culture ID Panel (Reflexed)     Status: Abnormal   Collection Time: 01/16/24  7:42 AM  Result Value Ref Range Status   Enterococcus faecalis NOT DETECTED NOT DETECTED Final   Enterococcus Faecium NOT DETECTED NOT DETECTED Final   Listeria monocytogenes NOT DETECTED NOT DETECTED Final   Staphylococcus  species DETECTED (A) NOT DETECTED Final    Comment: CRITICAL RESULT CALLED TO, READ BACK BY AND VERIFIED WITH: MAYA LYNWOOD ORN  [id number] FCP    Staphylococcus aureus (BCID) NOT DETECTED NOT DETECTED Final   Staphylococcus epidermidis DETECTED (A) NOT DETECTED Final    Comment: Methicillin (oxacillin) resistant coagulase negative staphylococcus. Possible blood culture contaminant (unless isolated from more than one blood culture draw or clinical case suggests pathogenicity). No antibiotic treatment is indicated for blood  culture contaminants. CRITICAL RESULT CALLED TO, READ BACK BY AND VERIFIED WITH: MAYA LYNWOOD ORN  [id number] FCP    Staphylococcus lugdunensis NOT DETECTED NOT DETECTED Final   Streptococcus species NOT DETECTED NOT DETECTED Final   Streptococcus agalactiae NOT DETECTED NOT DETECTED Final   Streptococcus pneumoniae NOT DETECTED NOT DETECTED Final   Streptococcus pyogenes NOT DETECTED NOT DETECTED Final   A.calcoaceticus-baumannii NOT DETECTED NOT DETECTED Final   Bacteroides fragilis NOT DETECTED NOT DETECTED Final   Enterobacterales NOT DETECTED NOT DETECTED Final   Enterobacter cloacae complex NOT DETECTED NOT DETECTED Final   Escherichia coli NOT DETECTED NOT DETECTED Final   Klebsiella aerogenes NOT DETECTED NOT DETECTED Final   Klebsiella oxytoca NOT DETECTED NOT DETECTED Final   Klebsiella pneumoniae NOT DETECTED NOT DETECTED Final   Proteus species NOT DETECTED NOT DETECTED Final   Salmonella species NOT DETECTED NOT DETECTED Final   Serratia marcescens NOT DETECTED NOT DETECTED Final   Haemophilus influenzae NOT DETECTED NOT DETECTED Final   Neisseria meningitidis NOT DETECTED NOT DETECTED Final   Pseudomonas aeruginosa NOT DETECTED NOT DETECTED Final   Stenotrophomonas maltophilia NOT DETECTED NOT DETECTED Final   Candida albicans NOT DETECTED NOT DETECTED Final   Candida auris NOT DETECTED NOT DETECTED Final   Candida glabrata NOT DETECTED NOT DETECTED  Final   Candida krusei NOT DETECTED NOT DETECTED Final   Candida parapsilosis NOT DETECTED NOT DETECTED Final   Candida tropicalis NOT DETECTED NOT DETECTED Final   Cryptococcus neoformans/gattii NOT DETECTED NOT DETECTED Final   Methicillin resistance mecA/C DETECTED (A) NOT DETECTED Final    Comment: CRITICAL RESULT CALLED TO, READ BACK BY AND VERIFIED WITH: PHARMD JAMES W  0140  [id number] FCP Performed at [facility], [address], [place], [place] [address]      Serology:    Imaging: If present, new imagings (plain films, ct scans, and mri) have been personally visualized and interpreted; radiology reports have been reviewed. Decision making incorporated into the Impression / Recommendations.  8/27 kub 1. Slight rotational artifact with biliary stent projecting over the lower thoracic and upper lumbar spine. 2. Cannot exclude mild antegrade migration of the biliary stent. Biliary stent in upper abdomen with some rotational artifact, consistent with the reported migration.  8/27 cxr 1. Moderate right and small left pleural effusions with associated bibasilar opacities. 2. Mild pulmonary edema. 3. Mild cardiomegaly.    Constance ONEIDA Passer, MD [facility] [phone] pager    01/17/2024, 7:28 PM

## 2024-01-17 NOTE — Progress Notes (Signed)
   01/16/24 2318  Assess: MEWS Score  Temp (!) 102.3 F (39.1 C)  BP (!) 116/52  MAP (mmHg) 70  Pulse Rate 84  ECG Heart Rate 85  Resp 20  Level of Consciousness Alert  SpO2 96 %  O2 Device Nasal Cannula  O2 Flow Rate (L/min) 2 L/min  Assess: MEWS Score  MEWS Temp 2  MEWS Systolic 0  MEWS Pulse 0  MEWS RR 0  MEWS LOC 0  MEWS Score 2  MEWS Score Color Yellow  Assess: if the MEWS score is Yellow or Red  Were vital signs accurate and taken at a resting state? Yes  Does the patient meet 2 or more of the SIRS criteria? No  MEWS guidelines implemented  Yes, yellow  Treat  MEWS Interventions Considered administering scheduled or prn medications/treatments as ordered  Take Vital Signs  Increase Vital Sign Frequency  Yellow: Q2hr x1, continue Q4hrs until patient remains green for 12hrs  Escalate  MEWS: Escalate Yellow: Discuss with charge nurse and consider notifying provider and/or RRT  Notify: Charge Nurse/RN  Name of Charge Nurse/RN Notified Dawn, RN  Provider Notification  Provider Name/Title Alfornia, MD  Date Provider Notified 01/16/24  Time Provider Notified 2327  Method of Notification Page  Notification Reason Other (Comment) (yellow MEWs d/t fever)  Provider response No new orders  Date of Provider Response 01/16/24  Time of Provider Response 2350  Assess: SIRS CRITERIA  SIRS Temperature  1  SIRS Respirations  0  SIRS Pulse 0  SIRS WBC 0  SIRS Score Sum  1

## 2024-01-17 NOTE — Consult Note (Signed)
 Palliative Care Consult Note                                  Date: 01/17/2024   Patient Name: Jeffrey Young  DOB: 09-09-1965  MRN: [id number]  Age / Sex: 58 y.o., male  PCP: [facility] Referring Physician: Dennise Lavada POUR, MD  Reason for Consultation: {Reason for Consult:23484}  Past Medical History:  Diagnosis Date   Arthritis    Hip   CHF (congestive heart failure) (HCC)    Colon cancer (HCC)    Diabetic mononeuropathy associated with type 2 diabetes mellitus (HCC) 03/21/2017   Diabetic retinopathy (HCC) 07/31/2020   Enthesopathy of ankle and tarsus 04/02/2009   Formatting of this note might be different from the original. 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 Type 2 Diabetes Mellitus - Uncomplicated, Uncontrolled    Subjective:   This NP Camellia Kays reviewed medical records, received report from team, assessed the patient and then meet at the patient's bedside to discuss diagnosis, prognosis, GOC, EOL wishes disposition and options.  Before meeting with the patient/family, I spent time reviewing the chart notes including ***. I also reviewed vital signs, nursing flowsheets, medication administrations record,  labs, and imaging. Labs reviewed include ***.  I met with ***.   We meet to discuss diagnosis prognosis, GOC, EOL wishes, disposition and options. Concept of Palliative Care was introduced as specialized medical care for people and their families living with serious illness.  If focuses on providing relief from the symptoms and stress of a serious illness.  The goal is to improve quality of life for both the patient and the family. Values and goals of care important to patient and family were attempted to be elicited.  Created space and opportunity for patient  and family to explore thoughts and feelings regarding current medical situation   Natural trajectory and current clinical status were discussed. Questions and concerns addressed. Patient  encouraged to call with questions or concerns.    Patient/Family Understanding of Illness: ***  Life Review: ***  Patient Values: ***  Baseline Status: ***  Today's Discussion: ***  Goals: ***  Review of Systems  Objective:   Primary Diagnoses: Present on Admission:  Hematemesis  Acute on chronic anemia  Malignant neoplasm of colon (HCC)   Vital Signs:  BP (!) 126/54 (BP Location: Left Arm)   Pulse 74   Temp 99 F (37.2 C) (Oral)   Resp 17   Ht 6' 1 (1.854 m)   Wt 73.3 kg   SpO2 98%   BMI 21.32 kg/m   Physical Exam  Palliative Assessment/Data: ***   Advanced Care Planning:   Existing Vynca/ACP Documentation: ***  Primary Decision Maker: {Primary Decision Fjxzm:78612}  Pertinent  diagnosis: ***  The patient and/or family consented to a voluntary Advance Care Planning Conversation in person/over the phone***. Individuals present for the conversation:  Summary of the conversation: ***  Outcome of the conversations and/or documents completed: ***  I spent *** minutes providing separately identifiable ACP services with the patient and/or surrogate decision maker in a voluntary, in-person conversation  discussing the patient's wishes and goals as detailed in the above note.  Assessment & Plan:   HPI/Patient Profile: 58 y.o. male  with past medical history of *** admitted on 01/14/2024 with ***.   SUMMARY OF RECOMMENDATIONS   ***  Symptom Management:  ***  Code Status: {Updated Palliative Code Status:33307}  Prognosis:  {Palliative Care Prognosis:23504}  Discharge Planning:  {Palliative dispostion:23505}   Discussed with: ***    Thank you for allowing us  to participate in the care of [name] PMT will continue to support holistically.  Time Total: ***  Detailed review of medical records (labs, imaging, vital signs), medically appropriate exam, discussed with treatment team, counseling and education to patient, family, & staff, documenting clinical information, medication management, coordination of care  Signed by: Camellia Kays, NP Palliative Medicine Team  Team Phone # [phone] (Nights/Weekends)  01/17/2024, 12:07 PM

## 2024-01-17 NOTE — Progress Notes (Incomplete)
 PHARMACY - PHYSICIAN COMMUNICATION CRITICAL VALUE ALERT - BLOOD CULTURE IDENTIFICATION (BCID)  Jeffrey Young is an 58 y.o. male who presented to [facility] on 01/14/2024 with a chief complaint of acute upper GI bleed and worsening anemia, now with fever with unclear etiology due to possible aspiration   -8/28 AM: 2/4 bottles with staph epi w/ mecA gene detected -8/28 PM 1/4 bottles (different set from MRSE) with strep spp (most likely contaminate)   Assessment:  *** (include suspected source if known)  Name of physician (or Provider) Contacted: ***  Current antibiotics: ***  Changes to prescribed antibiotics recommended:  {Plan:21268}  Results for orders placed or performed during the hospital encounter of 01/14/24  Blood Culture ID Panel (Reflexed) (Collected: 01/16/2024  2:54 PM)  Result Value Ref Range   Enterococcus faecalis NOT DETECTED NOT DETECTED   Enterococcus Faecium NOT DETECTED NOT DETECTED   Listeria monocytogenes NOT DETECTED NOT DETECTED   Staphylococcus species NOT DETECTED NOT DETECTED   Staphylococcus aureus (BCID) NOT DETECTED NOT DETECTED   Staphylococcus epidermidis NOT DETECTED NOT DETECTED   Staphylococcus lugdunensis NOT DETECTED NOT DETECTED   Streptococcus species DETECTED (A) NOT DETECTED   Streptococcus agalactiae NOT DETECTED NOT DETECTED   Streptococcus pneumoniae NOT DETECTED NOT DETECTED   Streptococcus pyogenes NOT DETECTED NOT DETECTED   A.calcoaceticus-baumannii NOT DETECTED NOT DETECTED   Bacteroides fragilis NOT DETECTED NOT DETECTED   Enterobacterales NOT DETECTED NOT DETECTED   Enterobacter cloacae complex NOT DETECTED NOT DETECTED   Escherichia coli NOT DETECTED NOT DETECTED   Klebsiella aerogenes NOT DETECTED NOT DETECTED   Klebsiella oxytoca NOT DETECTED NOT DETECTED   Klebsiella pneumoniae NOT DETECTED NOT DETECTED   Proteus species NOT DETECTED NOT DETECTED   Salmonella species NOT DETECTED NOT DETECTED   Serratia marcescens NOT  DETECTED NOT DETECTED   Haemophilus influenzae NOT DETECTED NOT DETECTED   Neisseria meningitidis NOT DETECTED NOT DETECTED   Pseudomonas aeruginosa NOT DETECTED NOT DETECTED   Stenotrophomonas maltophilia NOT DETECTED NOT DETECTED   Candida albicans NOT DETECTED NOT DETECTED   Candida auris NOT DETECTED NOT DETECTED   Candida glabrata NOT DETECTED NOT DETECTED   Candida krusei NOT DETECTED NOT DETECTED   Candida parapsilosis NOT DETECTED NOT DETECTED   Candida tropicalis NOT DETECTED NOT DETECTED   Cryptococcus neoformans/gattii NOT DETECTED NOT DETECTED    Lynwood Poplar, PharmD, BCPS Clinical Pharmacist 01/17/2024 11:56 PM

## 2024-01-18 ENCOUNTER — Inpatient Hospital Stay (HOSPITAL_COMMUNITY)

## 2024-01-18 DIAGNOSIS — C189 Malignant neoplasm of colon, unspecified: Secondary | ICD-10-CM | POA: Diagnosis not present

## 2024-01-18 DIAGNOSIS — T80211A Bloodstream infection due to central venous catheter, initial encounter: Secondary | ICD-10-CM

## 2024-01-18 DIAGNOSIS — C799 Secondary malignant neoplasm of unspecified site: Secondary | ICD-10-CM

## 2024-01-18 DIAGNOSIS — R7881 Bacteremia: Secondary | ICD-10-CM

## 2024-01-18 DIAGNOSIS — T827XXA Infection and inflammatory reaction due to other cardiac and vascular devices, implants and grafts, initial encounter: Secondary | ICD-10-CM

## 2024-01-18 HISTORY — DX: Bacteremia: R78.81

## 2024-01-18 HISTORY — PX: IR REMOVAL TUN CV CATH W/O FL: IMG2289

## 2024-01-18 LAB — CBC WITH DIFFERENTIAL/PLATELET
Abs Immature Granulocytes: 0.16 K/uL — ABNORMAL HIGH (ref 0.00–0.07)
Basophils Absolute: 0.1 K/uL (ref 0.0–0.1)
Basophils Relative: 1 %
Eosinophils Absolute: 0.1 K/uL (ref 0.0–0.5)
Eosinophils Relative: 1 %
HCT: 25.1 % — ABNORMAL LOW (ref 39.0–52.0)
Hemoglobin: 8.4 g/dL — ABNORMAL LOW (ref 13.0–17.0)
Immature Granulocytes: 1 %
Lymphocytes Relative: 9 %
Lymphs Abs: 1.3 K/uL (ref 0.7–4.0)
MCH: 29.8 pg (ref 26.0–34.0)
MCHC: 33.5 g/dL (ref 30.0–36.0)
MCV: 89 fL (ref 80.0–100.0)
Monocytes Absolute: 1.4 K/uL — ABNORMAL HIGH (ref 0.1–1.0)
Monocytes Relative: 10 %
Neutro Abs: 11.6 K/uL — ABNORMAL HIGH (ref 1.7–7.7)
Neutrophils Relative %: 78 %
Platelets: 191 K/uL (ref 150–400)
RBC: 2.82 MIL/uL — ABNORMAL LOW (ref 4.22–5.81)
RDW: 16.2 % — ABNORMAL HIGH (ref 11.5–15.5)
WBC: 14.6 K/uL — ABNORMAL HIGH (ref 4.0–10.5)
nRBC: 0 % (ref 0.0–0.2)

## 2024-01-18 LAB — COMPREHENSIVE METABOLIC PANEL WITH GFR
ALT: 24 U/L (ref 0–44)
AST: 34 U/L (ref 15–41)
Albumin: <1.5 g/dL — ABNORMAL LOW (ref 3.5–5.0)
Alkaline Phosphatase: 188 U/L — ABNORMAL HIGH (ref 38–126)
Anion gap: 18 — ABNORMAL HIGH (ref 5–15)
BUN: 29 mg/dL — ABNORMAL HIGH (ref 6–20)
CO2: 21 mmol/L — ABNORMAL LOW (ref 22–32)
Calcium: 8 mg/dL — ABNORMAL LOW (ref 8.9–10.3)
Chloride: 92 mmol/L — ABNORMAL LOW (ref 98–111)
Creatinine, Ser: 4.75 mg/dL — ABNORMAL HIGH (ref 0.61–1.24)
GFR, Estimated: 13 mL/min — ABNORMAL LOW (ref >60)
Glucose, Bld: 131 mg/dL — ABNORMAL HIGH (ref 70–99)
Potassium: 3.9 mmol/L (ref 3.5–5.1)
Sodium: 131 mmol/L — ABNORMAL LOW (ref 135–145)
Total Bilirubin: 2 mg/dL — ABNORMAL HIGH (ref 0.0–1.2)
Total Protein: 5.4 g/dL — ABNORMAL LOW (ref 6.5–8.1)

## 2024-01-18 LAB — CULTURE, BLOOD (ROUTINE X 2): Special Requests: ADEQUATE

## 2024-01-18 LAB — GLUCOSE, CAPILLARY
Glucose-Capillary: 136 mg/dL — ABNORMAL HIGH (ref 70–99)
Glucose-Capillary: 134 mg/dL — ABNORMAL HIGH (ref 70–99)
Glucose-Capillary: 180 mg/dL — ABNORMAL HIGH (ref 70–99)

## 2024-01-18 LAB — PHOSPHORUS: Phosphorus: 2.7 mg/dL (ref 2.5–4.6)

## 2024-01-18 MED ORDER — LIDOCAINE HCL 1 % IJ SOLN
INTRAMUSCULAR | Status: AC
Start: 2024-01-18 — End: 2024-01-18
  Filled 2024-01-18: qty 20

## 2024-01-18 MED ORDER — LIDOCAINE HCL 1 % IJ SOLN
INTRAMUSCULAR | Status: AC
  Filled 2024-01-18: qty 20

## 2024-01-18 MED ORDER — VANCOMYCIN HCL 750 MG/150ML IV SOLN
750.0000 mg | Freq: Once | INTRAVENOUS | Status: AC
  Administered 2024-01-18: 750 mg via INTRAVENOUS
  Filled 2024-01-18: qty 150

## 2024-01-18 MED ORDER — CHLORHEXIDINE GLUCONATE 4 % EX SOLN
CUTANEOUS | Status: AC
  Filled 2024-01-18: qty 15

## 2024-01-18 MED ORDER — VANCOMYCIN HCL 750 MG/150ML IV SOLN
750.0000 mg | INTRAVENOUS | Status: DC
  Administered 2024-01-21: 750 mg via INTRAVENOUS
  Filled 2024-01-18: qty 150

## 2024-01-18 NOTE — Progress Notes (Signed)
   01/18/24 1226  Vitals  Temp 97.9 F (36.6 C)  Pulse Rate 73  Resp (!) 27  BP (!) 131/57  SpO2 100 %  O2 Device Room Air  Weight 68.7 kg  Type of Weight Post-Dialysis  Oxygen Therapy  Patient Activity (if Appropriate) In bed  Pulse Oximetry Type Continuous  Oximetry Probe Site Changed No  Post Treatment  Dialyzer Clearance Lightly streaked  Hemodialysis Intake (mL) 0 mL  Liters Processed 78.7  Fluid Removed (mL) 2000 mL  Tolerated HD Treatment Yes  AVG/AVF Arterial Site Held (minutes) 7 minutes  AVG/AVF Venous Site Held (minutes) 7 minutes   Received patient in bed to unit.  Alert and oriented.  Informed consent signed and in chart.   TX duration:3.75hrs  Patient tolerated well.  Transported back to the room  Alert, without acute distress.  Hand-off given to patient's nurse.   Access used: RAVG Access issues: none  Total UF removed: 2L Medication(s) given: none    [facility]

## 2024-01-18 NOTE — Progress Notes (Addendum)
 IP PROGRESS NOTE  Subjective:   Jeffrey Young has no new complaint.  No further hematemesis.  His wife was at the bedside when I saw him at approximately 6:30 AM.     Objective: Vital signs in last 24 hours: Blood pressure 118/60, pulse 72, temperature 97.9 F (36.6 C), temperature source Oral, resp. rate 18, height 6' 1 (1.854 m), weight 161 lb 9.6 oz (73.3 kg), SpO2 95%.  Intake/Output from previous day: 08/28 0701 - 08/29 0700 In: 0  Out: 300 [Urine:300]  Physical Exam:  HEENT: No thrush  Abdomen: Soft, masslike fullness in the right mid abdomen, nontender Extremities: No leg edema Skin: The left heel ulcer has almost completely healed-no evidence of infection, superficial ulcer at the right trochanter without drainage or surrounding erythema   Portacath and dialysis catheter without erythema  Lab Results: Recent Labs    01/16/24 0532 01/17/24 0623  WBC 13.8* 16.1*  HGB 8.5* 8.8*  HCT 24.8* 26.6*  PLT 165 183    BMET Recent Labs    01/16/24 0532 01/17/24 0623  NA 128* 133*  K 2.9* 3.5  CL 92* 95*  CO2 28 28  GLUCOSE 237* 120*  BUN 21* 18  CREATININE 4.48* 3.63*  CALCIUM  7.8* 8.0*    Lab Results  Component Value Date   CEA1 140.0 (H) 01/02/2024   CEA 111.26 (H) 01/10/2024   CAN199 1,452 (H) 01/02/2024    Studies/Results: DG Abd Portable 1V Result Date: 01/16/2024 EXAM: 1 VIEW XRAY OF THE ABDOMEN 01/16/2024 01:41:00 PM COMPARISON: None available. CLINICAL HISTORY: Biliary stent migration FINDINGS: BOWEL: Nonobstructive bowel gas pattern. The proximal portion of the biliary stent projects over the inferior portion of the T12 vertebra and the distal portion of the stent projects over the midline of the L2 vertebra. There is some rotational artifact with the stent in the projection of the spine. On the ERCP images, the proximal portion of the stent was at the superior aspect of the T12 level with the distal portion of the stent was over the L2 level. SOFT  TISSUES: No opaque urinary calculi. Biliary stent in upper abdomen. Surgical chain sutures in left hemiabdomen. Partially imaged vascular catheter tip in right atrium. BONES: No acute osseous abnormality. IMPRESSION: 1. Slight rotational artifact with biliary stent projecting over the lower thoracic and upper lumbar spine. 2. Cannot exclude mild antegrade migration of the biliary stent. Biliary stent in upper abdomen with some rotational artifact, consistent with the reported migration. Electronically signed by: Waddell Calk MD 01/16/2024 02:08 PM EDT RP Workstation: [id number]    Medications: I have reviewed the patient's current medications.  Assessment/Plan:  Descending colon cancer, stage IIIc (T3N2b M0), status post a partial left colectomy 07/30/2020, 9/16 lymph nodes positive, lymphovascular invasion, 1 satellite nodule, negative margins, MSS, no loss of mismatch repair protein expression; foundation 1 K-ras wild-type, NRAS Q61H, microsatellite stable, tumor mutation burden 4. -History of large polyp in the left side of the colon-referred to [facility] in 05/2018 for procedure canceled secondary to COVID-19 pandemic.  Procedure was not rescheduled. -CT chest/abdomen/pelvis with contrast 07/27/2020-3 small pulmonary nodules less than 5 mm favored to be benign, circumferential luminal narrowing of the distal transverse colon concerning for malignancy, no metastatic adenopathy in the mesentery porta hepatis, no for metastasis. -CEA on 07/27/2020 was 17.3; 37 on 08/30/2020; 33 on 09/13/2020 -Colonoscopy performed 07/27/2020 showed a fungating, infiltrative and ulcerated nonobstructing large mass in the proximal descending colon.  Biopsy-adenocarcinoma -Cycle 1 FOLFOX 08/30/2020 -Cycle 2 FOLFOX 09/13/2020,  Emend  added for delayed nausea -Cycle 3 FOLFOX 09/27/2020 -Cycle 4 FOLFOX 10/11/2020 -Cycle 5 FOLFOX 11/08/2020 -Cycle 6 FOLFOX 11/23/2020 -CT 12/03/2020-prior 3 mm left apical nodule no longer seen, no new/suspicious  pulmonary nodules, no evidence of metastatic disease -Cycle 7 FOLFOX 12/06/2020 -Cycle 8 FOLFOX 12/21/2020 -Cycle 9 FOLFOX 01/03/2021 -Cycle 10 FOLFOX 01/17/2021 -Cycle 11 FOLFOX 01/31/2021, oxaliplatin  held secondary to neuropathy symptoms -Cycle 12 FOLFOX 02/15/2021, oxaliplatin  held secondary to neuropathy -CT abdomen/pelvis 04/02/2021-no evidence of recurrent colon cancer -CT chest 04/08/2021-no evidence of metastatic disease -Elevated CEA February and March 2023 -08/17/2021 PET scan-no evidence of local recurrence or metastasis -09/26/2021-Guardant-ctDNA detected -Colonoscopy 10/04/2021-negative -CT 11/17/2021-no evidence of metastatic disease -CTs 01/08/2022-no evidence of metastatic disease -PET scan 08/10/2022-several newly enlarged lymph nodes with moderate metabolic activity -CT 11/06/2022-stable left supraclavicular and retroperitoneal nodes, mild progression of pelvic adenopathy, new 6 mm segment 2 liver lesion -CT 03/05/2023-increase size of left supraclavicular node and retroperitoneal nodes, increased size of gastropathic and left iliac side chain lymph nodes, stable subtle hypoattenuating lesions at segment 2 in the hepatic dome -CT abdomen/pelvis 05/07/2023-new intrahepatic and extrahepatic biliary duct dilatation with possible associated periportal edema.  Common duct dilated up to 15 mm diameter, abruptly terminates prior to entering the head of the pancreas.  Distended gallbladder with possible gallbladder wall thickening and pericholecystic edema.  No substantial change in lymphadenopathy of the gastrohepatic ligament, hepatoduodenal ligament and left external iliac chain. 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  Moderate diffuse intrahepatic and proximal extrahepatic biliary duct dilatation.  Mild gallbladder distention with moderate diffuse gallbladder wall  thickening.  Minimal pericholecystic fluid and fat stranding.  Solitary 1.1 cm anterior segment 2 left liver mass.  Multiple mildly enlarged porta hepatis and portacaval nodes. -Biopsy perihepatic lymph node 05/10/2023-malignant cells, adenocarcinoma -CTs 07/02/2023-persistent biliary duct dilatation with indwelling stent.  Persistent wall thickening of the gallbladder.  Subtle areas of nodal enlargement identified in the abdomen and pelvis, similar to recent prior examinations.  Enlarged node in the supraclavicular region appears smaller.  No new thoracic nodes.  Mild areas of groundglass in the upper lobes of the lungs bilaterally, nonspecific. - CTs 10/13/2023: Interval growth of a periportal node, nonpathologically enlarged left supraclavicular retroperitoneal nodes, persistent mild intra [place] biliary duct dilatation with common bile duct stents in place -CT abdomen/pelvis 01/01/2024: Vague 1.4 cm mass bulging the anterior contour of segment 3, intra [place] and exophytic bile duct dilation-increased, distended gallbladder, mass anterior to the superior margin of the pancreas body, enlarged gastropathic ligament and porta hepatis nodes, small pleural effusions 2.  Anemia due to GI bleeding, iron deficiency?,  Renal insufficiency? 3.  New onset acute diastolic CHF March 2022 4.  Diabetes mellitus 5.  Renal failure-dialysis catheter placed 11/19/2023.  Dialysis scheduled to begin 11/21/2023 Monday Wednesday Friday schedule. 6.  Hypertension 7.  History of left transmetatarsal amputation 8.  History of colon polyps 9.  Neuropathy 10.  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  Admission with obstructive jaundice 05/07/2023 - 05/22/2023.  Upper EUS 05/10/2023-lymph node visualized and measured in the porta hepatis region; FNA showed malignant cells, adenocarcinoma.  ERCP 05/10/2023-biliary tract  obstruction secondary to a mass in the common bile duct, plastic stent placed into the common bile duct.  ERCP 05/18/2023-visibly patent stent from the common bile duct was seen in  the major papilla.  Stent removed from the common bile duct.  2 plastic stents placed into the common bile duct. 12/31/2023 nausea/jaundice: ERCP 01/02/2024-occlusion of plastic bile duct stents-removed, an uncovered metal stent was placed, a single stricture was found in the common bile duct, clot coming from the bile duct 13.  Admission 12/31/2023 with hematemesis and severe anemia, EGD 01/02/2024: No evidence of a bleeding source in the upper GI tract 14.  Admission 01/14/2024 with severe anemia and hematemesis EGD 01/15/2024: Nonbleeding gastric ulcer with adherent clot, biopsy-adenocarcinoma 1 unit packed red blood cells 01/15/2024 15.  Fever-positive blood culture for staph epidermidis     [name] has metastatic colon cancer.  He has not been treated for the metastatic colon cancer.  He has multiple comorbid conditions including dialysis dependent renal failure, biliary obstruction, and severe anemia.  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  He follow-up fever in the hospital.  The source for infection has not been identified.  A blood culture returned positive for Staph epidermidis and a second culture returned positive for a strep species  The source for fever/infection could be the hemodialysis catheter, Port-A-Cath, biliary stent, aspiration, left heel or hip ulcer, or tumor.  The heel and hip ulcers do not appear grossly infected.  The fever has improved over the past 2 days.  The hemoglobin is stable following a Red cell transfusion 01/15/2024.  I discussed the difficult situation with Mr. Bartolomei.  He is not a candidate for systemic chemotherapy unless his performance status improves.  The bilirubin is now lower which may allow for FOLFIRI chemotherapy if he improves.  He is being  followed by gastroenterology, urology, infectious disease, and the medical service.  I discussed the difficult situation with Mr. Carsten and his wife this morning.  He has metastatic colon cancer with a new malignant gastric ulcer and multiple comorbid conditions.  He would like to continue treatment of the cancer as opposed to hospice care.  I discussed the case with Dr. Rollin this morning.  He feels the bile duct stent is in good position. Recommendations:  Follow hemoglobin, transfuse packed red blood cells as needed, continue antiacid therapy, consider embolization for recurrent GI bleeding Hemodialysis per nephrology Evaluation/management of the fever per infectious disease and the medical service Continue goals of care discussions with Mr. Bovey and his wife Can access Port-A-Cath for blood draws and IV access I will check on him 01/22/2024.  Please call oncology as needed  LOS: 2 days   Arley Hof, MD   01/18/2024, 8:08 AM

## 2024-01-18 NOTE — Progress Notes (Addendum)
 Jeffrey Young is an 58 y.o. 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 Admitted due to hematemesis. On admission his hgb was 6.6. He was given 1 unit of PRBC. EGD 01/15/2024   Dialysis Orders: [facility] MWF EDW 74 kg - reported that he is losing weight and has poor appetite 4 hours 3K/2.5 Ca bath 400/600 No ESA due to malignancy Hectorol 1 mcg  Assessment/Plan:  Upper GI bleed: patient had episode of hematemesis post HD on 01/14/24. Hgb dropped to 6.6 and he was given 1 unit of PRBC in ED.  Patient just hospitalized for similar episode on 8/12-8/14 and had stent placed during ERCP. EGD 01/15/2024 showed a gastric ulcer with adherent clot, reflux esophagitis but no active bleed.   ESRD:  On HD at [facility] clinic MWF.  Using [facility] and has R AVG that is maturing and will be ready for use 01/17/24 (surgical date on 12/06/2023).   No issues at all with 2 16 ga needles cannulating the right upper arm AV graft today  Fever curve coming down  HFpEF: does not appear to be volume overloaded Hyperbilirubinemia: elevated on presentation but showing mild improvement. CT noted increase in intrahepatic vile duct dilation compared from CT 10/13/23. Appears to be second to pancreatic mass or lymphadenopathy. Had stent replaced 01/02/24 by Dr. Rollin.   Hypertension/volume: BP fairly well-controlled today. Reported that he has been losing weight from his home unit. He has had his EDW lowered multiple times. He was recently placed on Mirtazapine . Patient appears euvolemic on exam.   Anemia: Not on ESA due to malignancy. He has received 1 U PRBC so far with this hospitalization. Last Hgb 6.6 -> 9.2 -> 8.5. No venofer due to high ferritin level.  Metabolic bone disease: On  hectorol 1 mcg. No calcitriol . Corrected Ca 9.8.   Nutrition:  Albumin  1.5. Starting protein supplements when he is cleared for diet.  DM: on SSI per primary.   Subjective: Lethargic but easily arousable, breathing comfortably.  Patient had a Tmax of 102.3 overnight on Wed, tmax over night 99.1 .  He denies any diarrhea,  abdominal pain; has intermittent cough.  Wife bedside updated.   Chemistry and CBC: Creatinine  Date/Time Value Ref Range Status  01/10/2024 08:00 AM 3.13 (H) 0.61 - 1.24 mg/dL Final  92/98/7974 88:74 AM 6.41 (H) 0.61 - 1.24 mg/dL Final  93/81/7974 89:94 AM 6.79 (H) 0.61 - 1.24 mg/dL Final  94/80/7974 91:89 AM 5.73 (H) 0.61 - 1.24 mg/dL Final  95/77/7974 90:76 AM 6.66 (H) 0.61 - 1.24 mg/dL Final  95/89/7974 91:79 AM 6.11 (H) 0.61 - 1.24 mg/dL Final  97/82/7974 98:88 PM 5.59 (H) 0.61 - 1.24 mg/dL Final  98/78/7974 89:68 AM 4.48 (H) 0.61 - 1.24 mg/dL Final  98/93/7974 88:56 AM 5.42 (H) 0.61 - 1.24 mg/dL Final  93/82/7975 91:85 AM 3.53 (H) 0.61 - 1.24 mg/dL Final  95/98/7975 88:86 AM 3.11 (H) 0.61 - 1.24 mg/dL Final  97/83/7975 91:91 AM 3.17 (H) 0.61 - 1.24 mg/dL Final  87/70/7976 89:59 AM 2.94 (H) 0.61 - 1.24 mg/dL Final  88/86/7976 89:93 AM 3.01 (HH) 0.61 - 1.24 mg/dL Final    Comment:    CRITICAL RESULT CALLED TO, READ BACK BY AND VERIFIED WITH: CT SUSAN COWARD @ 1134. 04/03/2022.klj  11/07/2021 08:31 AM 2.83 (H) 0.61 - 1.24 mg/dL Final  89/89/7977 87:87 PM 1.72 (H) 0.61 - 1.24 mg/dL Final  90/72/7977 91:73 AM 1.82 (H) 0.61 - 1.24 mg/dL Final  90/87/7977 91:56 AM 1.34 (H) 0.61 - 1.24 mg/dL Final  91/70/7977 91:84 AM 1.54 (H) 0.61 - 1.24 mg/dL Final  91/84/7977 90:89 AM 1.34 (H) 0.61 - 1.24 mg/dL Final  91/98/7977 87:69 PM 1.75 (H) 0.61 - 1.24 mg/dL Final  92/81/7977 91:69 AM 1.36 (H) 0.61 - 1.24 mg/dL Final  92/94/7977 91:80 AM 1.58 (H) 0.61 - 1.24 mg/dL Final  93/79/7977 91:79 AM 1.77 (H) 0.61 - 1.24 mg/dL Final  93/86/7977 91:99 AM 1.64 (H) 0.61 - 1.24 mg/dL  Final  93/93/7977 91:71 AM 1.79 (H) 0.61 - 1.24 mg/dL Final  94/76/7977 91:50 AM 2.00 (H) 0.61 - 1.24 mg/dL Final  94/90/7977 91:66 AM 2.10 (H) 0.61 - 1.24 mg/dL Final  95/74/7977 91:99 AM 2.21 (H) 0.61 - 1.24 mg/dL Final  95/88/7977 90:72 AM 2.60 (H) 0.61 - 1.24 mg/dL Final  96/71/7977 92:48 AM 2.01 (H) 0.61 - 1.24 mg/dL Final   Creatinine, Ser  Date/Time Value Ref Range Status  01/17/2024 06:23 AM 3.63 (H) 0.61 - 1.24 mg/dL Final  91/72/7974 94:67 AM 4.48 (H) 0.61 - 1.24 mg/dL Final  91/73/7974 95:96 AM 3.32 (H) 0.61 - 1.24 mg/dL Final  91/74/7974 94:59 PM 2.33 (H) 0.61 - 1.24 mg/dL Final  91/86/7974 95:50 AM 4.38 (H) 0.61 - 1.24 mg/dL Final  91/87/7974 97:48 AM 3.01 (H) 0.61 - 1.24 mg/dL Final  91/88/7974 93:65 PM 1.90 (H) 0.61 - 1.24 mg/dL Final  91/88/7974 93:93 PM 2.09 (H) 0.61 - 1.24 mg/dL Final  92/82/7974 90:44 AM 3.70 (H) 0.61 - 1.24 mg/dL Final  87/68/7975 94:99 AM 5.06 (H) 0.61 - 1.24 mg/dL Final  87/69/7975 97:43 AM 5.98 (H) 0.61 - 1.24 mg/dL Final  87/70/7975 96:92 AM 5.89 (H) 0.61 - 1.24 mg/dL Final  87/71/7975 96:59 AM 5.36 (H) 0.61 - 1.24 mg/dL Final  87/72/7975 87:60 PM 5.70 (H) 0.61 - 1.24 mg/dL Final  87/72/7975 96:80 AM 4.46 (H) 0.61 - 1.24 mg/dL Final  87/73/7975 97:43 AM 4.37 (H) 0.61 - 1.24 mg/dL Final  87/74/7975 96:83 AM 4.37 (H) 0.61 - 1.24 mg/dL Final  87/75/7975 98:87 AM 4.30 (H) 0.61 - 1.24 mg/dL Final  87/76/7975 97:82 AM 4.23 (H) 0.61 - 1.24 mg/dL Final  87/77/7975 95:83 AM 4.71 (H) 0.61 - 1.24 mg/dL Final  87/78/7975 96:95 AM 4.84 (H) 0.61 - 1.24 mg/dL Final  87/79/7975 94:92 PM 4.72 (H) 0.61 - 1.24 mg/dL Final  87/79/7975 96:81 AM 3.37 (H) 0.61 - 1.24 mg/dL Final  87/80/7975 95:93 AM 4.14 (H) 0.61 - 1.24 mg/dL Final  87/81/7975 96:80 AM 4.18 (H) 0.61 - 1.24 mg/dL Final  87/82/7975 95:89 AM 4.75 (H) 0.61 - 1.24 mg/dL Final  87/83/7975 89:76 AM 5.02 (H) 0.61 - 1.24 mg/dL Final  88/76/7977 96:54 AM 1.56 (H) 0.61 - 1.24 mg/dL Final  88/77/7977 95:74  AM 1.76 (H) 0.61 - 1.24 mg/dL Final  88/78/7977 96:99 AM 1.55 (H) 0.61 - 1.24 mg/dL Final  88/79/7977 96:46 AM 1.43 (H) 0.61 - 1.24 mg/dL Final  88/80/7977 94:66 AM 1.39 (H) 0.61 - 1.24 mg/dL Final  88/81/7977 96:61 AM 1.68 (H) 0.61 - 1.24 mg/dL Final  88/82/7977 96:68 AM 1.57 (H) 0.61 - 1.24 mg/dL Final  88/83/7977 94:57 AM 1.52 (H) 0.61 - 1.24 mg/dL Final  88/84/7977 96:40 PM 1.73 (H) 0.61 - 1.24 mg/dL Final  88/85/7977 95:66 AM 1.80 (H)  0.61 - 1.24 mg/dL Final  88/86/7977 95:63 AM 1.92 (H) 0.61 - 1.24 mg/dL Final  88/87/7977 94:41 AM 2.09 (H) 0.61 - 1.24 mg/dL Final  88/88/7977 90:69 PM 2.18 (H) 0.61 - 1.24 mg/dL Final  95/69/7977 92:52 PM 2.20 (H) 0.61 - 1.24 mg/dL Final  96/84/7977 89:57 AM 2.13 (H) 0.61 - 1.24 mg/dL Final  96/85/7977 95:44 AM 2.30 (H) 0.61 - 1.24 mg/dL Final  96/86/7977 94:63 AM 2.55 (H) 0.61 - 1.24 mg/dL Final  96/87/7977 87:78 PM 2.53 (H) 0.61 - 1.24 mg/dL Final  96/87/7977 94:83 AM 2.41 (H) 0.61 - 1.24 mg/dL Final  96/88/7977 95:46 AM 1.81 (H) 0.61 - 1.24 mg/dL Final  96/89/7977 94:64 AM 2.12 (H) 0.61 - 1.24 mg/dL Final  96/90/7977 94:94 AM 1.85 (H) 0.61 - 1.24 mg/dL Final  96/91/7977 93:98 AM 1.65 (H) 0.61 - 1.24 mg/dL Final  96/92/7977 94:85 AM 1.95 (H) 0.61 - 1.24 mg/dL Final  96/93/7977 89:55 AM 1.57 (H) 0.61 - 1.24 mg/dL Final   Recent Labs  Lab 01/14/24 1740 01/15/24 0403 01/16/24 0532 01/17/24 0623  NA 132* 130* 128* 133*  K 3.4* 3.1* 2.9* 3.5  CL 92* 93* 92* 95*  CO2 29 27 28 28   GLUCOSE 135* 145* 237* 120*  BUN 9 14 21* 18  CREATININE 2.33* 3.32* 4.48* 3.63*  CALCIUM  7.9* 7.8* 7.8* 8.0*   Recent Labs  Lab 01/15/24 0403 01/15/24 1306 01/16/24 0532 01/17/24 0623  WBC 12.9* 15.9* 13.8* 16.1*  NEUTROABS  --   --  11.3* 13.0*  HGB 6.6* 9.2*  9.3* 8.5* 8.8*  HCT 20.4* 27.4*  27.8* 24.8* 26.6*  MCV 92.3 89.3 87.6 89.6  PLT 171 190 165 183   Liver Function Tests: Recent Labs  Lab 01/15/24 0403 01/16/24 0532 01/17/24 0623  AST 40  39 37  ALT 30 30 29   ALKPHOS 200* 211* 194*  BILITOT 2.2* 2.1* 2.5*  PROT 5.4* 5.5* 5.6*  ALBUMIN  1.5* 1.6* 1.5*   No results for input(s): LIPASE, AMYLASE in the last 168 hours. No results for input(s): AMMONIA in the last 168 hours. Cardiac Enzymes: No results for input(s): CKTOTAL, CKMB, CKMBINDEX, TROPONINI in the last 168 hours. Iron Studies: No results for input(s): IRON, TIBC, TRANSFERRIN, FERRITIN in the last 72 hours. PT/INR: @LABRCNTIP (inr:5)  Xrays/Other Studies: ) Results for orders placed or performed during the hospital encounter of 01/14/24 (from the past 48 hours)  Glucose, capillary     Status: Abnormal   Collection Time: 01/16/24  7:41 AM  Result Value Ref Range   Glucose-Capillary 239 (H) 70 - 99 mg/dL    Comment: Glucose reference range applies only to samples taken after fasting for at least 8 hours.  Culture, blood (Routine X 2) w Reflex to ID Panel     Status: None (Preliminary result)   Collection Time: 01/16/24  7:42 AM   Specimen: BLOOD LEFT ARM  Result Value Ref Range   Specimen Description BLOOD LEFT ARM    Special Requests      BOTTLES DRAWN AEROBIC AND ANAEROBIC Blood Culture adequate volume   Culture  Setup Time      GRAM POSITIVE COCCI IN CLUSTERS IN BOTH AEROBIC AND ANAEROBIC BOTTLES CRITICAL RESULT CALLED TO, READ BACK BY AND VERIFIED WITH: MAYA LYNWOOD ORN  [id number] FCP Performed at [facility], [address] [place], [place] [address]    Culture GRAM POSITIVE COCCI    Report Status PENDING   Blood Culture ID Panel (Reflexed)  Status: Abnormal   Collection Time: 01/16/24  7:42 AM  Result Value Ref Range   Enterococcus faecalis NOT DETECTED NOT DETECTED   Enterococcus Faecium NOT DETECTED NOT DETECTED   Listeria monocytogenes NOT DETECTED NOT DETECTED   Staphylococcus species DETECTED (A) NOT DETECTED    Comment: CRITICAL RESULT CALLED TO, READ BACK BY AND VERIFIED WITH: MAYA LYNWOOD ORN  [id number]  FCP    Staphylococcus aureus (BCID) NOT DETECTED NOT DETECTED   Staphylococcus epidermidis DETECTED (A) NOT DETECTED    Comment: Methicillin (oxacillin) resistant coagulase negative staphylococcus. Possible blood culture contaminant (unless isolated from more than one blood culture draw or clinical case suggests pathogenicity). No antibiotic treatment is indicated for blood  culture contaminants. CRITICAL RESULT CALLED TO, READ BACK BY AND VERIFIED WITH: MAYA LYNWOOD ORN  [id number] FCP    Staphylococcus lugdunensis NOT DETECTED NOT DETECTED   Streptococcus species NOT DETECTED NOT DETECTED   Streptococcus agalactiae NOT DETECTED NOT DETECTED   Streptococcus pneumoniae NOT DETECTED NOT DETECTED   Streptococcus pyogenes NOT DETECTED NOT DETECTED   A.calcoaceticus-baumannii NOT DETECTED NOT DETECTED   Bacteroides fragilis NOT DETECTED NOT DETECTED   Enterobacterales NOT DETECTED NOT DETECTED   Enterobacter cloacae complex NOT DETECTED NOT DETECTED   Escherichia coli NOT DETECTED NOT DETECTED   Klebsiella aerogenes NOT DETECTED NOT DETECTED   Klebsiella oxytoca NOT DETECTED NOT DETECTED   Klebsiella pneumoniae NOT DETECTED NOT DETECTED   Proteus species NOT DETECTED NOT DETECTED   Salmonella species NOT DETECTED NOT DETECTED   Serratia marcescens NOT DETECTED NOT DETECTED   Haemophilus influenzae NOT DETECTED NOT DETECTED   Neisseria meningitidis NOT DETECTED NOT DETECTED   Pseudomonas aeruginosa NOT DETECTED NOT DETECTED   Stenotrophomonas maltophilia NOT DETECTED NOT DETECTED   Candida albicans NOT DETECTED NOT DETECTED   Candida auris NOT DETECTED NOT DETECTED   Candida glabrata NOT DETECTED NOT DETECTED   Candida krusei NOT DETECTED NOT DETECTED   Candida parapsilosis NOT DETECTED NOT DETECTED   Candida tropicalis NOT DETECTED NOT DETECTED   Cryptococcus neoformans/gattii NOT DETECTED NOT DETECTED   Methicillin resistance mecA/C DETECTED (A) NOT DETECTED    Comment: CRITICAL  RESULT CALLED TO, READ BACK BY AND VERIFIED WITH: MAYA LYNWOOD ORN  [id number] FCP Performed at [facility], [address], [place], [place] [address]   Glucose, capillary     Status: Abnormal   Collection Time: 01/16/24 12:44 PM  Result Value Ref Range   Glucose-Capillary 140 (H) 70 - 99 mg/dL    Comment: Glucose reference range applies only to samples taken after fasting for at least 8 hours.  Culture, blood (Routine X 2) w Reflex to ID Panel     Status: None (Preliminary result)   Collection Time: 01/16/24  2:54 PM   Specimen: BLOOD  Result Value Ref Range   Specimen Description BLOOD SITE NOT SPECIFIED    Special Requests      BOTTLES DRAWN AEROBIC AND ANAEROBIC Blood Culture adequate volume   Culture  Setup Time      GRAM POSITIVE COCCI IN CHAINS AEROBIC BOTTLE ONLY CRITICAL RESULT CALLED TO, READ BACK BY AND VERIFIED WITH: MAYA LYNWOOD ORN [id number] FCP Performed at [facility], [address], [place], [place] [address]    Culture GRAM POSITIVE COCCI    Report Status PENDING   Blood Culture ID Panel (Reflexed)     Status: Abnormal   Collection Time: 01/16/24  2:54 PM  Result Value  Ref Range   Enterococcus faecalis NOT DETECTED NOT DETECTED   Enterococcus Faecium NOT DETECTED NOT DETECTED   Listeria monocytogenes NOT DETECTED NOT DETECTED   Staphylococcus species NOT DETECTED NOT DETECTED   Staphylococcus aureus (BCID) NOT DETECTED NOT DETECTED   Staphylococcus epidermidis NOT DETECTED NOT DETECTED   Staphylococcus lugdunensis NOT DETECTED NOT DETECTED   Streptococcus species DETECTED (A) NOT DETECTED    Comment: Not Enterococcus species, Streptococcus agalactiae, Streptococcus pyogenes, or Streptococcus pneumoniae. CRITICAL RESULT CALLED TO, READ BACK BY AND VERIFIED WITH: MAYA LYNWOOD ORN [id number] FCP    Streptococcus agalactiae NOT DETECTED NOT DETECTED   Streptococcus pneumoniae NOT DETECTED NOT DETECTED   Streptococcus pyogenes NOT DETECTED NOT  DETECTED   A.calcoaceticus-baumannii NOT DETECTED NOT DETECTED   Bacteroides fragilis NOT DETECTED NOT DETECTED   Enterobacterales NOT DETECTED NOT DETECTED   Enterobacter cloacae complex NOT DETECTED NOT DETECTED   Escherichia coli NOT DETECTED NOT DETECTED   Klebsiella aerogenes NOT DETECTED NOT DETECTED   Klebsiella oxytoca NOT DETECTED NOT DETECTED   Klebsiella pneumoniae NOT DETECTED NOT DETECTED   Proteus species NOT DETECTED NOT DETECTED   Salmonella species NOT DETECTED NOT DETECTED   Serratia marcescens NOT DETECTED NOT DETECTED   Haemophilus influenzae NOT DETECTED NOT DETECTED   Neisseria meningitidis NOT DETECTED NOT DETECTED   Pseudomonas aeruginosa NOT DETECTED NOT DETECTED   Stenotrophomonas maltophilia NOT DETECTED NOT DETECTED   Candida albicans NOT DETECTED NOT DETECTED   Candida auris NOT DETECTED NOT DETECTED   Candida glabrata NOT DETECTED NOT DETECTED   Candida krusei NOT DETECTED NOT DETECTED   Candida parapsilosis NOT DETECTED NOT DETECTED   Candida tropicalis NOT DETECTED NOT DETECTED   Cryptococcus neoformans/gattii NOT DETECTED NOT DETECTED    Comment: Performed at [facility], [address], [place], [place] [address]  Glucose, capillary     Status: Abnormal   Collection Time: 01/16/24  4:08 PM  Result Value Ref Range   Glucose-Capillary 138 (H) 70 - 99 mg/dL    Comment: Glucose reference range applies only to samples taken after fasting for at least 8 hours.  Glucose, capillary     Status: Abnormal   Collection Time: 01/16/24  8:03 PM  Result Value Ref Range   Glucose-Capillary 103 (H) 70 - 99 mg/dL    Comment: Glucose reference range applies only to samples taken after fasting for at least 8 hours.  Glucose, capillary     Status: Abnormal   Collection Time: 01/16/24  9:54 PM  Result Value Ref Range   Glucose-Capillary 118 (H) 70 - 99 mg/dL    Comment: Glucose reference range applies only to samples taken after fasting for at least 8 hours.   CBC with Differential/Platelet     Status: Abnormal   Collection Time: 01/17/24  6:23 AM  Result Value Ref Range   WBC 16.1 (H) 4.0 - 10.5 K/uL   RBC 2.97 (L) 4.22 - 5.81 MIL/uL   Hemoglobin 8.8 (L) 13.0 - 17.0 g/dL   HCT 73.3 (L) 60.9 - 47.9 %   MCV 89.6 80.0 - 100.0 fL   MCH 29.6 26.0 - 34.0 pg   MCHC 33.1 30.0 - 36.0 g/dL   RDW 83.8 (H) 88.4 - 84.4 %   Platelets 183 150 - 400 K/uL   nRBC 0.0 0.0 - 0.2 %   Neutrophils Relative % 81 %   Neutro Abs 13.0 (H) 1.7 - 7.7 K/uL   Lymphocytes Relative 7 %   Lymphs  Abs 1.2 0.7 - 4.0 K/uL   Monocytes Relative 10 %   Monocytes Absolute 1.6 (H) 0.1 - 1.0 K/uL   Eosinophils Relative 1 %   Eosinophils Absolute 0.1 0.0 - 0.5 K/uL   Basophils Relative 0 %   Basophils Absolute 0.1 0.0 - 0.1 K/uL   Immature Granulocytes 1 %   Abs Immature Granulocytes 0.18 (H) 0.00 - 0.07 K/uL    Comment: Performed at [facility], [address], [place], [place] [address]  Comprehensive metabolic panel with GFR     Status: Abnormal   Collection Time: 01/17/24  6:23 AM  Result Value Ref Range   Sodium 133 (L) 135 - 145 mmol/L   Potassium 3.5 3.5 - 5.1 mmol/L   Chloride 95 (L) 98 - 111 mmol/L   CO2 28 22 - 32 mmol/L   Glucose, Bld 120 (H) 70 - 99 mg/dL    Comment: Glucose reference range applies only to samples taken after fasting for at least 8 hours.   BUN 18 6 - 20 mg/dL   Creatinine, Ser 6.36 (H) 0.61 - 1.24 mg/dL   Calcium  8.0 (L) 8.9 - 10.3 mg/dL   Total Protein 5.6 (L) 6.5 - 8.1 g/dL   Albumin  1.5 (L) 3.5 - 5.0 g/dL   AST 37 15 - 41 U/L   ALT 29 0 - 44 U/L   Alkaline Phosphatase 194 (H) 38 - 126 U/L   Total Bilirubin 2.5 (H) 0.0 - 1.2 mg/dL   GFR, Estimated 18 (L) >60 mL/min    Comment: (NOTE) Calculated using the CKD-EPI Creatinine Equation (2021)    Anion gap 10 5 - 15    Comment: Performed at [facility], [address], [place], [place] [address]  Procalcitonin     Status: None   Collection Time: 01/17/24  6:23 AM  Result  Value Ref Range   Procalcitonin 16.73 ng/mL    Comment:        Interpretation: PCT >= 10 ng/mL: Important systemic inflammatory response, almost exclusively due to severe bacterial sepsis or septic shock. (NOTE)       Sepsis PCT Algorithm           Lower Respiratory Tract                                      Infection PCT Algorithm    ----------------------------     ----------------------------         PCT < 0.25 ng/mL                PCT < 0.10 ng/mL          Strongly encourage             Strongly discourage   discontinuation of antibiotics    initiation of antibiotics    ----------------------------     -----------------------------       PCT 0.25 - 0.50 ng/mL            PCT 0.10 - 0.25 ng/mL               OR       >80% decrease in PCT            Discourage initiation of  antibiotics      Encourage discontinuation           of antibiotics    ----------------------------     -----------------------------         PCT >= 0.50 ng/mL              PCT 0.26 - 0.50 ng/mL                AND       <80% decrease in PCT             Encourage initiation of                                             antibiotics       Encourage continuation           of antibiotics    ----------------------------     -----------------------------        PCT >= 0.50 ng/mL                  PCT > 0.50 ng/mL               AND         increase in PCT                  Strongly encourage                                      initiation of antibiotics    Strongly encourage escalation           of antibiotics                                     -----------------------------                                           PCT <= 0.25 ng/mL                                                 OR                                        > 80% decrease in PCT                                      Discontinue / Do not initiate                                              antibiotics  Performed at [facility], [address], [place], [place] [address]   Glucose, capillary     Status: Abnormal   Collection  Time: 01/17/24  7:50 AM  Result Value Ref Range   Glucose-Capillary 130 (H) 70 - 99 mg/dL    Comment: Glucose reference range applies only to samples taken after fasting for at least 8 hours.  Glucose, capillary     Status: Abnormal   Collection Time: 01/17/24 11:36 AM  Result Value Ref Range   Glucose-Capillary 134 (H) 70 - 99 mg/dL    Comment: Glucose reference range applies only to samples taken after fasting for at least 8 hours.  C-reactive protein     Status: Abnormal   Collection Time: 01/17/24  2:44 PM  Result Value Ref Range   CRP 23.3 (H) <1.0 mg/dL    Comment: Performed at [facility], [address] [place], [place] [address]  Glucose, capillary     Status: Abnormal   Collection Time: 01/17/24  3:27 PM  Result Value Ref Range   Glucose-Capillary 175 (H) 70 - 99 mg/dL    Comment: Glucose reference range applies only to samples taken after fasting for at least 8 hours.  Glucose, capillary     Status: Abnormal   Collection Time: 01/17/24  9:47 PM  Result Value Ref Range   Glucose-Capillary 120 (H) 70 - 99 mg/dL    Comment: Glucose reference range applies only to samples taken after fasting for at least 8 hours.   DG Abd Portable 1V Result Date: 01/16/2024 EXAM: 1 VIEW XRAY OF THE ABDOMEN 01/16/2024 01:41:00 PM COMPARISON: None available. CLINICAL HISTORY: Biliary stent migration FINDINGS: BOWEL: Nonobstructive bowel gas pattern. The proximal portion of the biliary stent projects over the inferior portion of the T12 vertebra and the distal portion of the stent projects over the midline of the L2 vertebra. There is some rotational artifact with the stent in the projection of the spine. On the ERCP images, the proximal portion of the stent was at the superior aspect of the T12 level with the distal portion of the stent was over  the L2 level. SOFT TISSUES: No opaque urinary calculi. Biliary stent in upper abdomen. Surgical chain sutures in left hemiabdomen. Partially imaged vascular catheter tip in right atrium. BONES: No acute osseous abnormality. IMPRESSION: 1. Slight rotational artifact with biliary stent projecting over the lower thoracic and upper lumbar spine. 2. Cannot exclude mild antegrade migration of the biliary stent. Biliary stent in upper abdomen with some rotational artifact, consistent with the reported migration. Electronically signed by: Waddell Calk MD 01/16/2024 02:08 PM EDT RP Workstation: [id number]    PMH:   Past Medical History:  Diagnosis Date   Arthritis    Hip   CHF (congestive heart failure) (HCC)    Colon cancer (HCC)    Diabetic mononeuropathy associated with type 2 diabetes mellitus (HCC) 03/21/2017   Diabetic retinopathy (HCC) 07/31/2020   Enthesopathy of ankle and tarsus 04/02/2009   Formatting of this note might be different from the original. 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 Type 2 Diabetes Mellitus - Uncomplicated, Uncontrolled    PSH:   Past Surgical History:  Procedure Laterality Date   AMPUTATION Left 01/22/2018   Procedure: TRANSMETATARSAL AMPUTATION;  Surgeon:  Harden Jerona GAILS, MD;  Location: [facility] OR;  Service: Orthopedics;  Laterality: Left;toes   BILIARY STENT PLACEMENT N/A 05/10/2023   Procedure: BILIARY STENT PLACEMENT;  Surgeon: Rollin Dover, MD;  Location: [facility] ENDOSCOPY;  Service: Gastroenterology;  Laterality: N/A;   BILIARY STENT PLACEMENT N/A 05/18/2023   Procedure: BILIARY STENT PLACEMENT;  Surgeon: Rollin Dover, MD;  Location: [facility] ENDOSCOPY;  Service: Gastroenterology;  Laterality: N/A;   BILIARY STENT PLACEMENT  01/02/2024   Procedure: INSERTION, STENT, BILE DUCT;  Surgeon: Rollin Dover, MD;  Location: [facility] ENDOSCOPY;  Service: Gastroenterology;;   BIOPSY  07/27/2020   Procedure: BIOPSY;  Surgeon: Rollin Dover, MD;  Location: [facility] ENDOSCOPY;  Service: Endoscopy;;   COLON RESECTION N/A 07/30/2020   Procedure: HAND ASSISTED LAPAROSCOPIC LEFT HEMI COLECTOMY;  Surgeon: Gladis Cough, MD;  Location: [facility];  Service: General;  Laterality: N/A;   COLONOSCOPY WITH PROPOFOL  N/A 05/24/2018   Procedure: COLONOSCOPY WITH PROPOFOL ;  Surgeon: Rollin Dover, MD;  Location: [facility];  Service: Endoscopy;  Laterality: N/A;   COLONOSCOPY WITH PROPOFOL  N/A 07/27/2020   Procedure: COLONOSCOPY WITH PROPOFOL ;  Surgeon: Rollin Dover, MD;  Location: [facility];  Service: Endoscopy;  Laterality: N/A;   DIALYSIS/PERMA CATHETER INSERTION N/A 11/19/2023   Procedure: DIALYSIS/PERMA CATHETER INSERTION;  Surgeon: [name] [name], MD;  Location: [facility];  Service: Cardiovascular;  Laterality: N/A;   ERCP N/A 05/10/2023   Procedure: ENDOSCOPIC RETROGRADE CHOLANGIOPANCREATOGRAPHY (ERCP);  Surgeon: Rollin Dover, MD;  Location: [facility];  Service: Gastroenterology;  Laterality: N/A;   ERCP N/A 05/18/2023   Procedure: ENDOSCOPIC RETROGRADE CHOLANGIOPANCREATOGRAPHY (ERCP);  Surgeon: Rollin Dover, MD;  Location: [facility];  Service: Gastroenterology;  Laterality: N/A;   ERCP N/A 01/02/2024   Procedure: ERCP, WITH INTERVENTION IF INDICATED;  Surgeon: Rollin Dover, MD;   Location: [facility];  Service: Gastroenterology;  Laterality: N/A;   ESOPHAGOGASTRODUODENOSCOPY Left 04/05/2021   Procedure: ESOPHAGOGASTRODUODENOSCOPY (EGD);  Surgeon: Rollin Dover, MD;  Location: [facility];  Service: Endoscopy;  Laterality: Left;   ESOPHAGOGASTRODUODENOSCOPY N/A 05/10/2023   Procedure: ESOPHAGOGASTRODUODENOSCOPY (EGD);  Surgeon: Rollin Dover, MD;  Location: [facility];  Service: Gastroenterology;  Laterality: N/A;   ESOPHAGOGASTRODUODENOSCOPY N/A 01/15/2024   Procedure: EGD (ESOPHAGOGASTRODUODENOSCOPY);  Surgeon: Rollin Dover, MD;  Location: [facility];  Service: Gastroenterology;  Laterality: N/A;   EUS N/A 05/10/2023   Procedure: UPPER ENDOSCOPIC ULTRASOUND (EUS) LINEAR;  Surgeon: Rollin Dover, MD;  Location: [facility];  Service: Gastroenterology;  Laterality: N/A;   FINE NEEDLE ASPIRATION N/A 05/10/2023   Procedure: FINE NEEDLE ASPIRATION (FNA) LINEAR;  Surgeon: Rollin Dover, MD;  Location: [facility];  Service: Gastroenterology;  Laterality: N/A;   INSERTION OF ARTERIOVENOUS (AV) ARTEGRAFT ARM Right 12/06/2023   Procedure: INSERTION, GRAFT, ARTERIOVENOUS, UPPER EXTREMITY;  Surgeon: [name] [name], MD;  Location: [facility];  Service: Vascular;  Laterality: Right;   INSERTION OF DIALYSIS CATHETER Right 12/06/2023   Procedure: EXCHANGE OF DIALYSIS CATHETER USING PALINDROME 23CM CATHETER KIT;  Surgeon: [name] [name], MD;  Location: [facility];  Service: Vascular;  Laterality: Right;   POLYPECTOMY  05/24/2018   Procedure: POLYPECTOMY;  Surgeon: Rollin Dover, MD;  Location: [facility];  Service: Endoscopy;;   PORTACATH PLACEMENT Left  08/24/2020   Procedure: INSERTION PORT-A-CATH;  Surgeon: Gladis Cough, MD;  Location: [facility];  Service: General;  Laterality: Left;  75/rm1   SPHINCTEROTOMY  05/10/2023   Procedure: ANNETT;  Surgeon: Rollin Dover, MD;  Location: [facility];  Service: Gastroenterology;;   ANNETT  05/18/2023   Procedure:  ANNETT;  Surgeon: Rollin Dover, MD;  Location: [facility];  Service: Gastroenterology;;   CLEDA REMOVAL  05/18/2023   Procedure: STENT REMOVAL;  Surgeon: Rollin Dover, MD;  Location: [facility];  Service: Gastroenterology;;   CLEDA REMOVAL  01/02/2024   Procedure: STENT REMOVAL;  Surgeon: Rollin Dover, MD;  Location[facility];  Service: Gastroenterology;;   SUBMUCOSAL TATTOO INJECTION  07/27/2020   Procedure: SUBMUCOSAL TATTOO INJECTION;  Surgeon: Rollin Dover, MD;  Location: [facility];  Service: Endoscopy;;    Allergies:  Allergies  Allergen Reactions   Bee Venom Anaphylaxis, Swelling and Other (See Comments)    Cold Sweats, also   Latex Rash and Dermatitis    Medications:   Prior to Admission medications   Medication Sig Start Date End Date Taking? Authorizing Provider  amLODipine  (NORVASC ) 10 MG tablet Take 10 mg by mouth daily.   Yes [provider]  baclofen  (LIORESAL ) 10 MG tablet Take 10 mg by mouth daily as needed for muscle spasms.   Yes [provider]  cadexomer iodine  (IODOSORB) 0.9 % gel Apply 1 Application topically daily as needed for wound care.   Yes [provider]  carvedilol  (COREG ) 3.125 MG tablet Take 1 tablet (3.125 mg total) by mouth 2 (two) times daily with a meal. 01/03/24  Yes Dennise Lavada POUR, MD  EPINEPHrine  0.3 mg/0.3 mL IJ SOAJ injection Inject 0.3 mg into the muscle as needed for anaphylaxis.   Yes [provider]  ergocalciferol (VITAMIN D2) 1.25 MG (50000 UT) capsule Take 50,000 Units by mouth once a week. Take on Monday   Yes [provider]  ferrous sulfate 325 (65 FE) MG EC tablet Take 325 mg by mouth daily with breakfast.   Yes [provider]  furosemide  (LASIX ) 20 MG tablet Take 20 mg by mouth.   Yes [provider]  LANTUS  SOLOSTAR 100 UNIT/ML Solostar Pen Inject 10-15 Units into the skin See admin instructions. Inject 15 units into the skin in the morning and 10  units into the skin at bedtime   Yes [provider]  loperamide (IMODIUM) 2 MG capsule Take 2 mg by mouth daily as needed for diarrhea or loose stools.   Yes [provider]  losartan (COZAAR) 50 MG tablet Take 50 mg by mouth daily. 01/11/24  Yes [provider]  mirtazapine  (REMERON ) 15 MG tablet Take 7.5 mg by mouth at bedtime. 12/26/23  Yes [provider]  Nutritional Supplements (FEEDING SUPPLEMENT, NEPRO CARB STEADY,) LIQD Take 237 mLs by mouth daily. Mixed berry   Yes [provider]  Sodium Chloride , GU Irrigant, (0.9 % SODIUM CHLORIDE , POUR BTL, OPTIME) Irrigate with 1,000 mLs as directed daily.   Yes [provider]    Discontinued Meds:   Medications Discontinued During This Encounter  Medication Reason   ACCU-CHEK GUIDE test strip    Continuous Glucose Sensor (DEXCOM G6 SENSOR) MISC    Continuous Glucose Transmitter (DEXCOM G6 TRANSMITTER) MISC    oxyCODONE  (ROXICODONE ) 5 MG immediate release tablet Completed Course   pantoprazole  (PROTONIX ) 40 MG tablet Completed Course   prochlorperazine  (COMPAZINE ) 10 MG tablet Change in therapy   calcitRIOL  (ROCALTROL ) 0.25 MCG capsule  Change in therapy   0.9 %  sodium chloride  infusion Patient Transfer   insulin  aspart (novoLOG ) injection 0-6 Units    pentafluoroprop-tetrafluoroeth (GEBAUERS) aerosol 1 Application Patient Transfer   lidocaine  (PF) (XYLOCAINE ) 1 % injection 5 mL Patient Transfer   lidocaine -prilocaine  (EMLA ) cream 1 Application Patient Transfer   heparin  injection 1,000 Units Patient Transfer   anticoagulant sodium citrate  solution 5 mL Patient Transfer   alteplase  (CATHFLO ACTIVASE ) injection 2 mg Patient Transfer   ampicillin -sulbactam (UNASYN ) 1.5 g in sodium chloride  0.9 % 100 mL IVPB     Social History:  reports that he has never smoked. He has never used smokeless tobacco. He reports current alcohol use of about 1.0 standard drink of alcohol per week. He reports  that he does not use drugs.  Family History:   Family History  Problem Relation Age of Onset   Hypertension Father     Blood pressure (!) 142/68, pulse 75, temperature 99 F (37.2 C), temperature source Oral, resp. rate 19, height 6' 1 (1.854 m), weight 73.3 kg, SpO2 98%. Physical Exam: GENERAL:  Lethargic but easily arousable, pleasant, NAD  HEENT:  EOMI CARDIOVASCULAR:  RRR, no murmurs appreciated RESPIRATORY:  Clear to auscultation GASTROINTESTINAL:  Soft, nontender, nondistended EXTREMITIES:  No LE edema bilaterally NEURO:  No new focal deficits appreciated SKIN:  No rashes noted PSYCH:  Appropriate mood and affect Dialysis access: TDC, right upper arm AV graft with a very nice thrill ready to be used on 01/17/2024 (placed 12/06/2023) -> 2 needles on 8/29     MELIA LYNWOOD ORN, MD 01/18/2024, 7:40 AM

## 2024-01-18 NOTE — Progress Notes (Signed)
 PROGRESS NOTE                                                                                                                                                                                                             Patient Demographics:    Jeffrey Young, is a 58 y.o. male, DOB - 01-21-1966, FMW:[id number]  Outpatient Primary MD for the patient is [facility]    LOS - 2  Admit date - 01/14/2024    Chief Complaint  Patient presents with   Abnormal Lab       Brief Narrative (HPI from H&P)   58 year old male with history of ESRD (MWF), colon cancer s/p partial colectomy, HFpEF, DM, transmetatarsal amputation left foot, anemia (baseline 7-9), presenting with acute upper GI bleed and worsening anemia.    Subjective:   Patient in bed, appears comfortable, denies any headache, no fever, no chest pain or pressure, no shortness of breath , no abdominal pain. No focal weakness.   Assessment  & Plan :   Acute blood loss anemia, hematemesis- Acute UGI Bleed, history of stage III colon cancer with metastasis, rebleed, had similar episode few weeks ago.  Has been seen by GI, s/p 1 unit of packed RBC transfusion, underwent EGD on 01/15/2024 showing a gastric ulcer which was nonbleeding but did have adherent clot, continue to monitor CBC, continue PPI.  Have also informed patient's oncology about his admission, will get palliative care for goals of care discussions.   Fever on running for the last 24 hours.  Blood cultures drawn morning of 01/16/2024, likely aspirated when he threw up some blood however he also has biliary stent and could have a GI source also is a ESRD patient on HD, placed him on Unasyn , had another hide temperature the night of 01/16/2024, 1 out of 2 cultures growing staph epi which is likely contamination, for now remains on broad-spectrum antibiotics, will check with ID, repeating blood cultures on 01/17/2024  per ID request they were drawn from the HD catheter in the right IJ by nephrology.  ID now managing antibiotics.  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 Cloretta. -Continue mirtazapine    Hypertension Blood pressure normal off medications -  Hold amlodipine , carvedilol , furosemide , losartan until hemodynamics clear   Hypokalemia and hyponatremia - Treat with dialysis, nephrology on board  Diabetes 2 Glucose controlled - Sliding scale corrections   Lab Results  Component Value Date   HGBA1C 5.4 12/31/2023   CBG (last 3)  Recent Labs    01/17/24 1527 01/17/24 2147 01/18/24 0751  GLUCAP 175* 120* 136*           Condition - Extremely Guarded  Family Communication  :  None  Code Status :  Full  Consults  :  GI, nephrology, Pall care, ID oncology  PUD Prophylaxis : PPI   Procedures  :     EGD  Impression:               - LA Grade D reflux esophagitis with no bleeding.                           - 2 cm hiatal hernia.                           - Non-bleeding gastric ulcer with an adherent clot                            (Forrest Class IIb). Biopsied. Injected.                           - Normal examined duodenum. Recommendation:           - Return patient to hospital ward for ongoing care.                           - Resume regular diet in 2 hours.                           - Continue present medications.                           - Await pathology results.                           - Monitor HGB and transfuse as necessary.                           - PPI QD indefinitely.      Disposition Plan  :    Status is: Inpatient   DVT Prophylaxis  :    SCDs Start: 01/14/24 2012    Lab Results  Component Value Date   PLT 183 01/17/2024    Diet :  Diet Order             Diet regular Fluid consistency: Thin  Diet  effective now                    Inpatient Medications  Scheduled Meds:  sodium chloride    Intravenous Once   calcitRIOL   0.25 mcg Oral Daily   Chlorhexidine  Gluconate Cloth  6 each Topical Q0600   insulin  aspart  0-6 Units Subcutaneous TID WC   metroNIDAZOLE   500 mg Oral Q12H   mirtazapine   7.5 mg Oral Daily   pantoprazole  (PROTONIX ) IV  40 mg  Intravenous Q12H   sodium chloride  flush  3 mL Intravenous Q12H   Continuous Infusions:  cefTRIAXone  (ROCEPHIN )  IV 2 g (01/17/24 2220)   vancomycin      PRN Meds:.acetaminophen  **OR** acetaminophen , chlorproMAZINE , fentaNYL  (SUBLIMAZE ) injection, oxyCODONE , prochlorperazine   Antibiotics  :    Anti-infectives (From admission, onward)    Start     Dose/Rate Route Frequency Ordered Stop   01/18/24 1200  vancomycin  (VANCOREADY) IVPB 750 mg/150 mL        750 mg 150 mL/hr over 60 Minutes Intravenous Every M-W-F (Hemodialysis) 01/17/24 0208     01/17/24 2200  cefTRIAXone  (ROCEPHIN ) 2 g in sodium chloride  0.9 % 100 mL IVPB        2 g 200 mL/hr over 30 Minutes Intravenous Every 24 hours 01/17/24 1028     01/17/24 2200  metroNIDAZOLE  (FLAGYL ) tablet 500 mg        500 mg Oral Every 12 hours 01/17/24 1028     01/17/24 0300  vancomycin  (VANCOREADY) IVPB 1750 mg/350 mL        1,750 mg 175 mL/hr over 120 Minutes Intravenous  Once 01/17/24 0208 01/17/24 1210   01/16/24 1415  ampicillin -sulbactam (UNASYN ) 1.5 g in sodium chloride  0.9 % 100 mL IVPB  Status:  Discontinued        1.5 g 200 mL/hr over 30 Minutes Intravenous 2 times daily 01/16/24 1323 01/17/24 1028         Objective:   Vitals:   01/17/24 2000 01/18/24 0000 01/18/24 0400 01/18/24 0747  BP: (!) 121/56 111/63 (!) 142/68 118/60  Pulse: 86 73 75 72  Resp: (!) 29 (!) 28 19 18   Temp: 99.4 F (37.4 C) 99.1 F (37.3 C) 99 F (37.2 C) 97.9 F (36.6 C)  TempSrc: Oral Oral Oral Oral  SpO2: 97% 96% 98% 95%  Weight:      Height:        Wt Readings from Last 3 Encounters:   01/16/24 73.3 kg  01/10/24 73.9 kg  01/02/24 70.6 kg     Intake/Output Summary (Last 24 hours) at 01/18/2024 0801 Last data filed at 01/17/2024 2000 Gross per 24 hour  Intake 0 ml  Output 300 ml  Net -300 ml     Physical Exam  Awake Alert, No new F.N deficits, Normal affect [place].AT,PERRAL Supple Neck, No JVD,   Symmetrical Chest wall movement, Good air movement bilaterally, CTAB RRR,No Gallops,Rubs or new Murmurs,  +ve B.Sounds, Abd Soft, No tenderness, right arm fistula, right IJ dialysis catheter, left subclavian Port-A-Cath No edema       Data Review:    Recent Labs  Lab 01/14/24 1740 01/15/24 0403 01/15/24 1306 01/16/24 0532 01/17/24 0623  WBC 18.2* 12.9* 15.9* 13.8* 16.1*  HGB 7.6* 6.6* 9.2*  9.3* 8.5* 8.8*  HCT 23.3* 20.4* 27.4*  27.8* 24.8* 26.6*  PLT 199 171 190 165 183  MCV 90.7 92.3 89.3 87.6 89.6  MCH 29.6 29.9 30.0 30.0 29.6  MCHC 32.6 32.4 33.6 34.3 33.1  RDW 16.5* 16.7* 16.5* 16.0* 16.1*  LYMPHSABS  --   --   --  0.8 1.2  MONOABS  --   --   --  1.5* 1.6*  EOSABS  --   --   --  0.0 0.1  BASOSABS  --   --   --  0.0 0.1    Recent Labs  Lab 01/14/24 1740 01/15/24 0403 01/16/24 0532 01/17/24 0623 01/17/24 1444  NA 132* 130* 128* 133*  --  K 3.4* 3.1* 2.9* 3.5  --   CL 92* 93* 92* 95*  --   CO2 29 27 28 28   --   ANIONGAP 11 10 8 10   --   GLUCOSE 135* 145* 237* 120*  --   BUN 9 14 21* 18  --   CREATININE 2.33* 3.32* 4.48* 3.63*  --   AST 39 40 39 37  --   ALT 33 30 30 29   --   ALKPHOS 242* 200* 211* 194*  --   BILITOT 2.6* 2.2* 2.1* 2.5*  --   ALBUMIN  1.7* 1.5* 1.6* 1.5*  --   CRP  --   --   --   --  23.3*  PROCALCITON  --   --   --  16.73  --   CALCIUM  7.9* 7.8* 7.8* 8.0*  --       Recent Labs  Lab 01/14/24 1740 01/15/24 0403 01/16/24 0532 01/17/24 0623 01/17/24 1444  CRP  --   --   --   --  23.3*  PROCALCITON  --   --   --  16.73  --   CALCIUM  7.9* 7.8* 7.8* 8.0*  --      --------------------------------------------------------------------------------------------------------------- Lab Results  Component Value Date   CHOL 152 07/27/2020   HDL 36 (L) 07/27/2020   LDLCALC 94 07/27/2020   TRIG 109 07/27/2020   CHOLHDL 4.2 07/27/2020    Lab Results  Component Value Date   HGBA1C 5.4 12/31/2023   No results for input(s): TSH, T4TOTAL, FREET4, T3FREE, THYROIDAB in the last 72 hours. No results for input(s): VITAMINB12, FOLATE, FERRITIN, TIBC, IRON, RETICCTPCT in the last 72 hours. ------------------------------------------------------------------------------------------------------------------ Cardiac Enzymes No results for input(s): CKMB, TROPONINI, MYOGLOBIN in the last 168 hours.  Invalid input(s): CK  Micro Results Recent Results (from the past 240 hours)  Culture, blood (Routine X 2) w Reflex to ID Panel     Status: None (Preliminary result)   Collection Time: 01/16/24  7:42 AM   Specimen: BLOOD LEFT ARM  Result Value Ref Range Status   Specimen Description BLOOD LEFT ARM  Final   Special Requests   Final    BOTTLES DRAWN AEROBIC AND ANAEROBIC Blood Culture adequate volume   Culture  Setup Time   Final    GRAM POSITIVE COCCI IN CLUSTERS IN BOTH AEROBIC AND ANAEROBIC BOTTLES CRITICAL RESULT CALLED TO, READ BACK BY AND VERIFIED WITH: MAYA LYNWOOD ORN  [id number] FCP Performed at [facility], [address] [place], [place] [address]    Culture GRAM POSITIVE COCCI  Final   Report Status PENDING  Incomplete  Blood Culture ID Panel (Reflexed)     Status: Abnormal   Collection Time: 01/16/24  7:42 AM  Result Value Ref Range Status   Enterococcus faecalis NOT DETECTED NOT DETECTED Final   Enterococcus Faecium NOT DETECTED NOT DETECTED Final   Listeria monocytogenes NOT DETECTED NOT DETECTED Final   Staphylococcus species DETECTED (A) NOT DETECTED Final    Comment: CRITICAL RESULT CALLED TO, READ BACK BY  AND VERIFIED WITH: MAYA LYNWOOD ORN  [id number] FCP    Staphylococcus aureus (BCID) NOT DETECTED NOT DETECTED Final   Staphylococcus epidermidis DETECTED (A) NOT DETECTED Final    Comment: Methicillin (oxacillin) resistant coagulase negative staphylococcus. Possible blood culture contaminant (unless isolated from more than one blood culture draw or clinical case suggests pathogenicity). No antibiotic treatment is indicated for blood  culture contaminants. CRITICAL RESULT CALLED TO, READ BACK BY  AND VERIFIED WITH: MAYA LYNWOOD ORN  [id number] FCP    Staphylococcus lugdunensis NOT DETECTED NOT DETECTED Final   Streptococcus species NOT DETECTED NOT DETECTED Final   Streptococcus agalactiae NOT DETECTED NOT DETECTED Final   Streptococcus pneumoniae NOT DETECTED NOT DETECTED Final   Streptococcus pyogenes NOT DETECTED NOT DETECTED Final   A.calcoaceticus-baumannii NOT DETECTED NOT DETECTED Final   Bacteroides fragilis NOT DETECTED NOT DETECTED Final   Enterobacterales NOT DETECTED NOT DETECTED Final   Enterobacter cloacae complex NOT DETECTED NOT DETECTED Final   Escherichia coli NOT DETECTED NOT DETECTED Final   Klebsiella aerogenes NOT DETECTED NOT DETECTED Final   Klebsiella oxytoca NOT DETECTED NOT DETECTED Final   Klebsiella pneumoniae NOT DETECTED NOT DETECTED Final   Proteus species NOT DETECTED NOT DETECTED Final   Salmonella species NOT DETECTED NOT DETECTED Final   Serratia marcescens NOT DETECTED NOT DETECTED Final   Haemophilus influenzae NOT DETECTED NOT DETECTED Final   Neisseria meningitidis NOT DETECTED NOT DETECTED Final   Pseudomonas aeruginosa NOT DETECTED NOT DETECTED Final   Stenotrophomonas maltophilia NOT DETECTED NOT DETECTED Final   Candida albicans NOT DETECTED NOT DETECTED Final   Candida auris NOT DETECTED NOT DETECTED Final   Candida glabrata NOT DETECTED NOT DETECTED Final   Candida krusei NOT DETECTED NOT DETECTED Final   Candida parapsilosis NOT DETECTED  NOT DETECTED Final   Candida tropicalis NOT DETECTED NOT DETECTED Final   Cryptococcus neoformans/gattii NOT DETECTED NOT DETECTED Final   Methicillin resistance mecA/C DETECTED (A) NOT DETECTED Final    Comment: CRITICAL RESULT CALLED TO, READ BACK BY AND VERIFIED WITH: MAYA LYNWOOD ORN  [id number] FCP Performed at [facility], [address] [place], [place] [address]   Culture, blood (Routine X 2) w Reflex to ID Panel     Status: None (Preliminary result)   Collection Time: 01/16/24  2:54 PM   Specimen: BLOOD  Result Value Ref Range Status   Specimen Description BLOOD SITE NOT SPECIFIED  Final   Special Requests   Final    BOTTLES DRAWN AEROBIC AND ANAEROBIC Blood Culture adequate volume   Culture  Setup Time   Final    GRAM POSITIVE COCCI IN CHAINS AEROBIC BOTTLE ONLY CRITICAL RESULT CALLED TO, READ BACK BY AND VERIFIED WITH: MAYA LYNWOOD ORN [id number] FCP Performed at [facility], [address], [place], [place] [address]    Culture GRAM POSITIVE COCCI  Final   Report Status PENDING  Incomplete  Blood Culture ID Panel (Reflexed)     Status: Abnormal   Collection Time: 01/16/24  2:54 PM  Result Value Ref Range Status   Enterococcus faecalis NOT DETECTED NOT DETECTED Final   Enterococcus Faecium NOT DETECTED NOT DETECTED Final   Listeria monocytogenes NOT DETECTED NOT DETECTED Final   Staphylococcus species NOT DETECTED NOT DETECTED Final   Staphylococcus aureus (BCID) NOT DETECTED NOT DETECTED Final   Staphylococcus epidermidis NOT DETECTED NOT DETECTED Final   Staphylococcus lugdunensis NOT DETECTED NOT DETECTED Final   Streptococcus species DETECTED (A) NOT DETECTED Final    Comment: Not Enterococcus species, Streptococcus agalactiae, Streptococcus pyogenes, or Streptococcus pneumoniae. CRITICAL RESULT CALLED TO, READ BACK BY AND VERIFIED WITH: MAYA LYNWOOD ORN [id number] FCP    Streptococcus agalactiae NOT DETECTED NOT DETECTED Final   Streptococcus  pneumoniae NOT DETECTED NOT DETECTED Final   Streptococcus pyogenes NOT DETECTED NOT DETECTED Final   A.calcoaceticus-baumannii NOT DETECTED NOT DETECTED Final   Bacteroides fragilis  NOT DETECTED NOT DETECTED Final   Enterobacterales NOT DETECTED NOT DETECTED Final   Enterobacter cloacae complex NOT DETECTED NOT DETECTED Final   Escherichia coli NOT DETECTED NOT DETECTED Final   Klebsiella aerogenes NOT DETECTED NOT DETECTED Final   Klebsiella oxytoca NOT DETECTED NOT DETECTED Final   Klebsiella pneumoniae NOT DETECTED NOT DETECTED Final   Proteus species NOT DETECTED NOT DETECTED Final   Salmonella species NOT DETECTED NOT DETECTED Final   Serratia marcescens NOT DETECTED NOT DETECTED Final   Haemophilus influenzae NOT DETECTED NOT DETECTED Final   Neisseria meningitidis NOT DETECTED NOT DETECTED Final   Pseudomonas aeruginosa NOT DETECTED NOT DETECTED Final   Stenotrophomonas maltophilia NOT DETECTED NOT DETECTED Final   Candida albicans NOT DETECTED NOT DETECTED Final   Candida auris NOT DETECTED NOT DETECTED Final   Candida glabrata NOT DETECTED NOT DETECTED Final   Candida krusei NOT DETECTED NOT DETECTED Final   Candida parapsilosis NOT DETECTED NOT DETECTED Final   Candida tropicalis NOT DETECTED NOT DETECTED Final   Cryptococcus neoformans/gattii NOT DETECTED NOT DETECTED Final    Comment: Performed at [facility], [address], [place], [place] [address]  Culture, blood (Routine X 2) w Reflex to ID Panel     Status: None (Preliminary result)   Collection Time: 01/17/24 10:45 AM   Specimen: BLOOD  Result Value Ref Range Status   Specimen Description BLOOD SITE NOT SPECIFIED  Final   Special Requests   Final    BOTTLES DRAWN AEROBIC AND ANAEROBIC Blood Culture adequate volume   Culture   Final    NO GROWTH < 24 HOURS Performed at [facility], [address] [place], [place] [address]    Report Status PENDING  Incomplete  Culture, blood (Routine X 2) w  Reflex to ID Panel     Status: None (Preliminary result)   Collection Time: 01/17/24 10:45 AM   Specimen: BLOOD  Result Value Ref Range Status   Specimen Description BLOOD SITE NOT SPECIFIED  Final   Special Requests   Final    BOTTLES DRAWN AEROBIC AND ANAEROBIC Blood Culture adequate volume   Culture   Final    NO GROWTH < 24 HOURS Performed at [facility], [address] [place], [place] [address]    Report Status PENDING  Incomplete    Radiology Report DG Abd Portable 1V Result Date: 01/16/2024 EXAM: 1 VIEW XRAY OF THE ABDOMEN 01/16/2024 01:41:00 PM COMPARISON: None available. CLINICAL HISTORY: Biliary stent migration FINDINGS: BOWEL: Nonobstructive bowel gas pattern. The proximal portion of the biliary stent projects over the inferior portion of the T12 vertebra and the distal portion of the stent projects over the midline of the L2 vertebra. There is some rotational artifact with the stent in the projection of the spine. On the ERCP images, the proximal portion of the stent was at the superior aspect of the T12 level with the distal portion of the stent was over the L2 level. SOFT TISSUES: No opaque urinary calculi. Biliary stent in upper abdomen. Surgical chain sutures in left hemiabdomen. Partially imaged vascular catheter tip in right atrium. BONES: No acute osseous abnormality. IMPRESSION: 1. Slight rotational artifact with biliary stent projecting over the lower thoracic and upper lumbar spine. 2. Cannot exclude mild antegrade migration of the biliary stent. Biliary stent in upper abdomen with some rotational artifact, consistent with the reported migration. Electronically signed by: Waddell Calk MD 01/16/2024 02:08 PM EDT RP Workstation: [id number]  Signature  -   Lavada Stank M.D on 01/18/2024 at 8:01 AM   -  To page go to [email or web address]

## 2024-01-18 NOTE — Progress Notes (Signed)
 Subjective: No new complaints   Antibiotics:  Anti-infectives (From admission, onward)    Start     Dose/Rate Route Frequency Ordered Stop   01/18/24 1200  vancomycin  (VANCOREADY) IVPB 750 mg/150 mL        750 mg 150 mL/hr over 60 Minutes Intravenous Every M-W-F (Hemodialysis) 01/17/24 0208     01/17/24 2200  cefTRIAXone  (ROCEPHIN ) 2 g in sodium chloride  0.9 % 100 mL IVPB        2 g 200 mL/hr over 30 Minutes Intravenous Every 24 hours 01/17/24 1028     01/17/24 2200  metroNIDAZOLE  (FLAGYL ) tablet 500 mg        500 mg Oral Every 12 hours 01/17/24 1028     01/17/24 0300  vancomycin  (VANCOREADY) IVPB 1750 mg/350 mL        1,750 mg 175 mL/hr over 120 Minutes Intravenous  Once 01/17/24 0208 01/17/24 1210   01/16/24 1415  ampicillin -sulbactam (UNASYN ) 1.5 g in sodium chloride  0.9 % 100 mL IVPB  Status:  Discontinued        1.5 g 200 mL/hr over 30 Minutes Intravenous 2 times daily 01/16/24 1323 01/17/24 1028       Medications: Scheduled Meds:  sodium chloride    Intravenous Once   calcitRIOL   0.25 mcg Oral Daily   Chlorhexidine  Gluconate Cloth  6 each Topical Q0600   insulin  aspart  0-6 Units Subcutaneous TID WC   metroNIDAZOLE   500 mg Oral Q12H   mirtazapine   7.5 mg Oral Daily   pantoprazole  (PROTONIX ) IV  40 mg Intravenous Q12H   sodium chloride  flush  3 mL Intravenous Q12H   Continuous Infusions:  cefTRIAXone  (ROCEPHIN )  IV 2 g (01/17/24 2220)   vancomycin      PRN Meds:.acetaminophen  **OR** acetaminophen , chlorproMAZINE , fentaNYL  (SUBLIMAZE ) injection, oxyCODONE , prochlorperazine     Objective: Weight change:   Intake/Output Summary (Last 24 hours) at 01/18/2024 1424 Last data filed at 01/18/2024 1226 Gross per 24 hour  Intake 0 ml  Output 2300 ml  Net -2300 ml   Blood pressure (!) 131/57, pulse 73, temperature 97.9 F (36.6 C), resp. rate (!) 27, height 6' 1 (1.854 m), weight 68.7 kg, SpO2 100%. Temp:  [97.9 F (36.6 C)-99.4 F (37.4 C)] 97.9 F  (36.6 C) (08/29 1226) Pulse Rate:  [69-97] 73 (08/29 1226) Resp:  [13-33] 27 (08/29 1226) BP: (111-152)/(56-69) 131/57 (08/29 1226) SpO2:  [95 %-100 %] 100 % (08/29 1226) Weight:  [68.7 kg-70.7 kg] 68.7 kg (08/29 1226)  Physical Exam: Physical Exam   CBC:    BMET Recent Labs    01/17/24 0623 01/18/24 0553  NA 133* 131*  K 3.5 3.9  CL 95* 92*  CO2 28 21*  GLUCOSE 120* 131*  BUN 18 29*  CREATININE 3.63* 4.75*  CALCIUM  8.0* 8.0*     Liver Panel  Recent Labs    01/17/24 0623 01/18/24 0553  PROT 5.6* 5.4*  ALBUMIN  1.5* <1.5*  AST 37 34  ALT 29 24  ALKPHOS 194* 188*  BILITOT 2.5* 2.0*       Sedimentation Rate No results for input(s): ESRSEDRATE in the last 72 hours. C-Reactive Protein Recent Labs    01/17/24 1444  CRP 23.3*    Micro Results: Recent Results (from the past 720 hours)  Culture, blood (Routine X 2) w Reflex to ID Panel     Status: Abnormal   Collection Time: 01/16/24  7:42 AM   Specimen: BLOOD LEFT ARM  Result  Value Ref Range Status   Specimen Description BLOOD LEFT ARM  Final   Special Requests   Final    BOTTLES DRAWN AEROBIC AND ANAEROBIC Blood Culture adequate volume   Culture  Setup Time   Final    GRAM POSITIVE COCCI IN CLUSTERS IN BOTH AEROBIC AND ANAEROBIC BOTTLES CRITICAL RESULT CALLED TO, READ BACK BY AND VERIFIED WITH: PHARMD JAMES W  [id number] FCP    Culture (A)  Final    STAPHYLOCOCCUS EPIDERMIDIS STAPHYLOCOCCUS HOMINIS THE SIGNIFICANCE OF ISOLATING THIS ORGANISM FROM A SINGLE SET OF BLOOD CULTURES WHEN MULTIPLE SETS ARE DRAWN IS UNCERTAIN. PLEASE NOTIFY THE MICROBIOLOGY DEPARTMENT WITHIN ONE WEEK IF SPECIATION AND SENSITIVITIES ARE REQUIRED. Performed at [facility], [address] [place], [place] [address]    Report Status 01/18/2024 FINAL  Final  Blood Culture ID Panel (Reflexed)     Status: Abnormal   Collection Time: 01/16/24  7:42 AM  Result Value Ref Range Status   Enterococcus faecalis NOT  DETECTED NOT DETECTED Final   Enterococcus Faecium NOT DETECTED NOT DETECTED Final   Listeria monocytogenes NOT DETECTED NOT DETECTED Final   Staphylococcus species DETECTED (A) NOT DETECTED Final    Comment: CRITICAL RESULT CALLED TO, READ BACK BY AND VERIFIED WITH: MAYA LYNWOOD ORN  [id number] FCP    Staphylococcus aureus (BCID) NOT DETECTED NOT DETECTED Final   Staphylococcus epidermidis DETECTED (A) NOT DETECTED Final    Comment: Methicillin (oxacillin) resistant coagulase negative staphylococcus. Possible blood culture contaminant (unless isolated from more than one blood culture draw or clinical case suggests pathogenicity). No antibiotic treatment is indicated for blood  culture contaminants. CRITICAL RESULT CALLED TO, READ BACK BY AND VERIFIED WITH: MAYA LYNWOOD ORN  [id number] FCP    Staphylococcus lugdunensis NOT DETECTED NOT DETECTED Final   Streptococcus species NOT DETECTED NOT DETECTED Final   Streptococcus agalactiae NOT DETECTED NOT DETECTED Final   Streptococcus pneumoniae NOT DETECTED NOT DETECTED Final   Streptococcus pyogenes NOT DETECTED NOT DETECTED Final   A.calcoaceticus-baumannii NOT DETECTED NOT DETECTED Final   Bacteroides fragilis NOT DETECTED NOT DETECTED Final   Enterobacterales NOT DETECTED NOT DETECTED Final   Enterobacter cloacae complex NOT DETECTED NOT DETECTED Final   Escherichia coli NOT DETECTED NOT DETECTED Final   Klebsiella aerogenes NOT DETECTED NOT DETECTED Final   Klebsiella oxytoca NOT DETECTED NOT DETECTED Final   Klebsiella pneumoniae NOT DETECTED NOT DETECTED Final   Proteus species NOT DETECTED NOT DETECTED Final   Salmonella species NOT DETECTED NOT DETECTED Final   Serratia marcescens NOT DETECTED NOT DETECTED Final   Haemophilus influenzae NOT DETECTED NOT DETECTED Final   Neisseria meningitidis NOT DETECTED NOT DETECTED Final   Pseudomonas aeruginosa NOT DETECTED NOT DETECTED Final   Stenotrophomonas maltophilia NOT DETECTED NOT  DETECTED Final   Candida albicans NOT DETECTED NOT DETECTED Final   Candida auris NOT DETECTED NOT DETECTED Final   Candida glabrata NOT DETECTED NOT DETECTED Final   Candida krusei NOT DETECTED NOT DETECTED Final   Candida parapsilosis NOT DETECTED NOT DETECTED Final   Candida tropicalis NOT DETECTED NOT DETECTED Final   Cryptococcus neoformans/gattii NOT DETECTED NOT DETECTED Final   Methicillin resistance mecA/C DETECTED (A) NOT DETECTED Final    Comment: CRITICAL RESULT CALLED TO, READ BACK BY AND VERIFIED WITH: MAYA LYNWOOD ORN  [id number] FCP Performed at [facility], [address], [place], [place] [address]   Culture, blood (Routine X 2) w Reflex to ID  Panel     Status: Abnormal (Preliminary result)   Collection Time: 01/16/24  2:54 PM   Specimen: BLOOD  Result Value Ref Range Status   Specimen Description BLOOD SITE NOT SPECIFIED  Final   Special Requests   Final    BOTTLES DRAWN AEROBIC AND ANAEROBIC Blood Culture adequate volume   Culture  Setup Time   Final    GRAM POSITIVE COCCI IN CHAINS AEROBIC BOTTLE ONLY CRITICAL RESULT CALLED TO, READ BACK BY AND VERIFIED WITH: MAYA LYNWOOD ORN [id number] FCP    Culture (A)  Final    STREPTOCOCCUS CONSTELLATUS CULTURE REINCUBATED FOR BETTER GROWTH Performed at [facility], [address] [place], [place] [address]    Report Status PENDING  Incomplete  Blood Culture ID Panel (Reflexed)     Status: Abnormal   Collection Time: 01/16/24  2:54 PM  Result Value Ref Range Status   Enterococcus faecalis NOT DETECTED NOT DETECTED Final   Enterococcus Faecium NOT DETECTED NOT DETECTED Final   Listeria monocytogenes NOT DETECTED NOT DETECTED Final   Staphylococcus species NOT DETECTED NOT DETECTED Final   Staphylococcus aureus (BCID) NOT DETECTED NOT DETECTED Final   Staphylococcus epidermidis NOT DETECTED NOT DETECTED Final   Staphylococcus lugdunensis NOT DETECTED NOT DETECTED Final   Streptococcus species DETECTED (A)  NOT DETECTED Final    Comment: Not Enterococcus species, Streptococcus agalactiae, Streptococcus pyogenes, or Streptococcus pneumoniae. CRITICAL RESULT CALLED TO, READ BACK BY AND VERIFIED WITH: MAYA LYNWOOD ORN [id number] FCP    Streptococcus agalactiae NOT DETECTED NOT DETECTED Final   Streptococcus pneumoniae NOT DETECTED NOT DETECTED Final   Streptococcus pyogenes NOT DETECTED NOT DETECTED Final   A.calcoaceticus-baumannii NOT DETECTED NOT DETECTED Final   Bacteroides fragilis NOT DETECTED NOT DETECTED Final   Enterobacterales NOT DETECTED NOT DETECTED Final   Enterobacter cloacae complex NOT DETECTED NOT DETECTED Final   Escherichia coli NOT DETECTED NOT DETECTED Final   Klebsiella aerogenes NOT DETECTED NOT DETECTED Final   Klebsiella oxytoca NOT DETECTED NOT DETECTED Final   Klebsiella pneumoniae NOT DETECTED NOT DETECTED Final   Proteus species NOT DETECTED NOT DETECTED Final   Salmonella species NOT DETECTED NOT DETECTED Final   Serratia marcescens NOT DETECTED NOT DETECTED Final   Haemophilus influenzae NOT DETECTED NOT DETECTED Final   Neisseria meningitidis NOT DETECTED NOT DETECTED Final   Pseudomonas aeruginosa NOT DETECTED NOT DETECTED Final   Stenotrophomonas maltophilia NOT DETECTED NOT DETECTED Final   Candida albicans NOT DETECTED NOT DETECTED Final   Candida auris NOT DETECTED NOT DETECTED Final   Candida glabrata NOT DETECTED NOT DETECTED Final   Candida krusei NOT DETECTED NOT DETECTED Final   Candida parapsilosis NOT DETECTED NOT DETECTED Final   Candida tropicalis NOT DETECTED NOT DETECTED Final   Cryptococcus neoformans/gattii NOT DETECTED NOT DETECTED Final    Comment: Performed at [facility], [address] [place], [place] [address]  Culture, blood (Routine X 2) w Reflex to ID Panel     Status: None (Preliminary result)   Collection Time: 01/17/24 10:45 AM   Specimen: BLOOD  Result Value Ref Range Status   Specimen Description BLOOD SITE NOT  SPECIFIED  Final   Special Requests   Final    BOTTLES DRAWN AEROBIC AND ANAEROBIC Blood Culture adequate volume   Culture   Final    NO GROWTH < 24 HOURS Performed at [facility], [address], [place], [place] [address]    Report Status  PENDING  Incomplete  Culture, blood (Routine X 2) w Reflex to ID Panel     Status: None (Preliminary result)   Collection Time: 01/17/24 10:45 AM   Specimen: BLOOD  Result Value Ref Range Status   Specimen Description BLOOD SITE NOT SPECIFIED  Final   Special Requests   Final    BOTTLES DRAWN AEROBIC AND ANAEROBIC Blood Culture adequate volume   Culture   Final    NO GROWTH < 24 HOURS Performed at [facility], [address], [place], [place] [address]    Report Status PENDING  Incomplete    Studies/Results: No results found.    Assessment/Plan:  INTERVAL HISTORY:  repeat blood cultures NG   Principal Problem:   Hematemesis Active Problems:   Acute on chronic anemia   DM2 (diabetes mellitus, type 2) (HCC)   Malignant neoplasm of colon (HCC)   End-stage renal disease on hemodialysis (HCC)    Jeffrey Young is a 58 y.o. 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  As mentioned by Dr. Overton if repeat blood cultures are + would consider endocarditis workup, but keep in mind that when DRAWING thru an HD catheter we could pick up a colonizer of the line  I will keep on the list to follow-up his blood cultures.  Please call with questions this Labor Day weekend.  I have personally spent 52  minutes involved in face-to-face and non-face-to-face activities for this patient on the day of the visit. Professional time spent includes the following activities: Preparing to see the patient (review of tests), Obtaining and/or reviewing separately obtained history (admission/discharge record), Performing a medically appropriate examination and/or evaluation , Ordering medications/tests/procedures, referring and communicating with other health care professionals, Documenting clinical information in the EMR, Independently interpreting results (not separately reported), Communicating results to the patient/family/caregiver, Counseling and educating the patient/family/caregiver and Care coordination (not separately reported).   Evaluation of the patient requires complex antimicrobial therapy evaluation, counseling , isolation needs to reduce disease transmission and risk assessment and mitigation.      LOS: 2 days   Jomarie Fleeta Rothman 01/18/2024, 2:24 PM

## 2024-01-18 NOTE — Plan of Care (Signed)

## 2024-01-19 ENCOUNTER — Other Ambulatory Visit: Payer: Self-pay

## 2024-01-19 DIAGNOSIS — C189 Malignant neoplasm of colon, unspecified: Secondary | ICD-10-CM | POA: Diagnosis not present

## 2024-01-19 LAB — COMPREHENSIVE METABOLIC PANEL WITH GFR
ALT: 20 U/L (ref 0–44)
AST: 24 U/L (ref 15–41)
Albumin: 1.6 g/dL — ABNORMAL LOW (ref 3.5–5.0)
Alkaline Phosphatase: 198 U/L — ABNORMAL HIGH (ref 38–126)
Anion gap: 14 (ref 5–15)
BUN: 20 mg/dL (ref 6–20)
CO2: 25 mmol/L (ref 22–32)
Calcium: 8.1 mg/dL — ABNORMAL LOW (ref 8.9–10.3)
Chloride: 93 mmol/L — ABNORMAL LOW (ref 98–111)
Creatinine, Ser: 3.58 mg/dL — ABNORMAL HIGH (ref 0.61–1.24)
GFR, Estimated: 19 mL/min — ABNORMAL LOW (ref >60)
Glucose, Bld: 184 mg/dL — ABNORMAL HIGH (ref 70–99)
Potassium: 3.5 mmol/L (ref 3.5–5.1)
Sodium: 132 mmol/L — ABNORMAL LOW (ref 135–145)
Total Bilirubin: 1.9 mg/dL — ABNORMAL HIGH (ref 0.0–1.2)
Total Protein: 5.5 g/dL — ABNORMAL LOW (ref 6.5–8.1)

## 2024-01-19 LAB — CBC WITH DIFFERENTIAL/PLATELET
Abs Immature Granulocytes: 0.13 K/uL — ABNORMAL HIGH (ref 0.00–0.07)
Basophils Absolute: 0.1 K/uL (ref 0.0–0.1)
Basophils Relative: 1 %
Eosinophils Absolute: 0.2 K/uL (ref 0.0–0.5)
Eosinophils Relative: 1 %
HCT: 25.7 % — ABNORMAL LOW (ref 39.0–52.0)
Hemoglobin: 8.3 g/dL — ABNORMAL LOW (ref 13.0–17.0)
Immature Granulocytes: 1 %
Lymphocytes Relative: 7 %
Lymphs Abs: 1 K/uL (ref 0.7–4.0)
MCH: 29.1 pg (ref 26.0–34.0)
MCHC: 32.3 g/dL (ref 30.0–36.0)
MCV: 90.2 fL (ref 80.0–100.0)
Monocytes Absolute: 1.2 K/uL — ABNORMAL HIGH (ref 0.1–1.0)
Monocytes Relative: 10 %
Neutro Abs: 10.3 K/uL — ABNORMAL HIGH (ref 1.7–7.7)
Neutrophils Relative %: 80 %
Platelets: 205 K/uL (ref 150–400)
RBC: 2.85 MIL/uL — ABNORMAL LOW (ref 4.22–5.81)
RDW: 16.1 % — ABNORMAL HIGH (ref 11.5–15.5)
WBC: 12.8 K/uL — ABNORMAL HIGH (ref 4.0–10.5)
nRBC: 0 % (ref 0.0–0.2)

## 2024-01-19 LAB — GLUCOSE, CAPILLARY
Glucose-Capillary: 168 mg/dL — ABNORMAL HIGH (ref 70–99)
Glucose-Capillary: 179 mg/dL — ABNORMAL HIGH (ref 70–99)
Glucose-Capillary: 191 mg/dL — ABNORMAL HIGH (ref 70–99)

## 2024-01-19 NOTE — Plan of Care (Signed)

## 2024-01-19 NOTE — Progress Notes (Signed)
 PROGRESS NOTE                                                                                                                                                                                                             Patient Demographics:    Jeffrey Young, is a 58 y.o. male, DOB - 1966-03-17, FMW:[id number]  Outpatient Primary MD for the patient is [facility]    LOS - 3  Admit date - 01/14/2024    Chief Complaint  Patient presents with   Abnormal Lab       Brief Narrative (HPI from H&P)   58 year old male with history of ESRD (MWF), colon cancer s/p partial colectomy, HFpEF, DM, transmetatarsal amputation left foot, anemia (baseline 7-9), presenting with acute upper GI bleed and worsening anemia.    Subjective:   Patient in bed, appears comfortable, denies any headache, no fever, no chest pain or pressure, no shortness of breath , no abdominal pain. No new focal weakness.    Assessment  & Plan :   Acute blood loss anemia, hematemesis- Acute UGI Bleed, history of stage III colon cancer with metastasis, rebleed, had similar episode few weeks ago.  Has been seen by GI, s/p 1 unit of packed RBC transfusion, underwent EGD on 01/15/2024 showing a gastric ulcer which was nonbleeding but did have adherent clot, continue to monitor CBC, continue PPI.  Have also informed patient's oncology about his admission, will get palliative care for goals of care discussions.   Fever on running for the last 24 hours.  Blood cultures drawn morning of 01/16/2024, likely aspirated when he threw up some blood however he also has biliary stent and could have a GI source also is a ESRD patient on HD, was placed on empiric antibiotics, had another hide temperature the night of 01/16/2024, 1 out of 2 cultures growing staph epi which is likely contamination, for now remains on broad-spectrum antibiotics, will check with ID, repeating blood  cultures on 01/17/2024 per ID request they were drawn from the HD catheter in the right IJ by nephrology.  ID now managing antibiotics.  For now per ID 2 weeks of total IV vancomycin .  Monitor.   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 Cloretta. -Continue mirtazapine    Hypertension Blood pressure normal off medications - Hold amlodipine , carvedilol , furosemide , losartan until hemodynamics clear   Hypokalemia and hyponatremia - Treat with dialysis, nephrology on board  Diabetes 2 Glucose controlled - Sliding scale corrections   Lab Results  Component Value Date   HGBA1C 5.4 12/31/2023   CBG (last 3)  Recent Labs    01/18/24 1533 01/18/24 2137 01/19/24 0839  GLUCAP 134* 180* 168*           Condition - Extremely Guarded  Family Communication  :  None  Code Status :  Full  Consults  :  GI, nephrology, Pall care, ID oncology  PUD Prophylaxis : PPI   Procedures  :     EGD  Impression:               - LA Grade D reflux esophagitis with no bleeding.                           - 2 cm hiatal hernia.                           - Non-bleeding gastric ulcer with an adherent clot                            (Forrest Class IIb). Biopsied. Injected.                           - Normal examined duodenum. Recommendation:           - Return patient to hospital ward for ongoing care.                           - Resume regular diet in 2 hours.                           - Continue present medications.                           - Await pathology results.                           - Monitor HGB and transfuse as necessary.                           - PPI QD indefinitely.      Disposition Plan  :    Status is: Inpatient   DVT Prophylaxis  :    SCDs Start: 01/14/24 2012    Lab Results  Component Value Date   PLT 205 01/19/2024     Diet :  Diet Order             Diet regular Fluid consistency: Thin  Diet effective now                    Inpatient Medications  Scheduled Meds:  sodium chloride    Intravenous Once   calcitRIOL   0.25 mcg Oral Daily   Chlorhexidine  Gluconate Cloth  6 each Topical Q0600   insulin  aspart  0-6 Units Subcutaneous TID WC   mirtazapine   7.5 mg Oral Daily  pantoprazole  (PROTONIX ) IV  40 mg Intravenous Q12H   sodium chloride  flush  3 mL Intravenous Q12H   Continuous Infusions:  [START ON 01/21/2024] vancomycin      PRN Meds:.acetaminophen  **OR** acetaminophen , chlorproMAZINE , fentaNYL  (SUBLIMAZE ) injection, oxyCODONE , prochlorperazine   Antibiotics  :    Anti-infectives (From admission, onward)    Start     Dose/Rate Route Frequency Ordered Stop   01/21/24 1200  vancomycin  (VANCOREADY) IVPB 750 mg/150 mL        750 mg 150 mL/hr over 60 Minutes Intravenous Every M-W-F (Hemodialysis) 01/18/24 1526     01/18/24 1615  vancomycin  (VANCOREADY) IVPB 750 mg/150 mL        750 mg 150 mL/hr over 60 Minutes Intravenous  Once 01/18/24 1526 01/18/24 1809   01/18/24 1200  vancomycin  (VANCOREADY) IVPB 750 mg/150 mL  Status:  Discontinued        750 mg 150 mL/hr over 60 Minutes Intravenous Every M-W-F (Hemodialysis) 01/17/24 0208 01/18/24 1526   01/17/24 2200  cefTRIAXone  (ROCEPHIN ) 2 g in sodium chloride  0.9 % 100 mL IVPB  Status:  Discontinued        2 g 200 mL/hr over 30 Minutes Intravenous Every 24 hours 01/17/24 1028 01/18/24 1530   01/17/24 2200  metroNIDAZOLE  (FLAGYL ) tablet 500 mg  Status:  Discontinued        500 mg Oral Every 12 hours 01/17/24 1028 01/18/24 1530   01/17/24 0300  vancomycin  (VANCOREADY) IVPB 1750 mg/350 mL        1,750 mg 175 mL/hr over 120 Minutes Intravenous  Once 01/17/24 0208 01/17/24 1210   01/16/24 1415  ampicillin -sulbactam (UNASYN ) 1.5 g in sodium chloride  0.9 % 100 mL IVPB  Status:  Discontinued        1.5 g 200 mL/hr over 30 Minutes Intravenous 2  times daily 01/16/24 1323 01/17/24 1028         Objective:   Vitals:   01/18/24 2317 01/19/24 0000 01/19/24 0400 01/19/24 0842  BP: 122/67 (!) 103/42 (!) 117/47 (!) 132/54  Pulse: 79  (!) 167 77  Resp:  (!) 32 16 (!) 26  Temp: 99.6 F (37.6 C)  99.3 F (37.4 C) 98.4 F (36.9 C)  TempSrc: Oral  Oral   SpO2:   94% 100%  Weight:      Height:        Wt Readings from Last 3 Encounters:  01/18/24 68.7 kg  01/10/24 73.9 kg  01/02/24 70.6 kg     Intake/Output Summary (Last 24 hours) at 01/19/2024 0918 Last data filed at 01/18/2024 1226 Gross per 24 hour  Intake --  Output 2000 ml  Net -2000 ml     Physical Exam  Awake Alert, No new F.N deficits, Normal affect [place].AT,PERRAL Supple Neck, No JVD,   Symmetrical Chest wall movement, Good air movement bilaterally, CTAB RRR,No Gallops,Rubs or new Murmurs,  +ve B.Sounds, Abd Soft, No tenderness, right arm fistula, right IJ dialysis catheter, left subclavian Port-A-Cath No edema       Data Review:    Recent Labs  Lab 01/15/24 1306 01/16/24 0532 01/17/24 0623 01/18/24 0553 01/19/24 0710  WBC 15.9* 13.8* 16.1* 14.6* 12.8*  HGB 9.2*  9.3* 8.5* 8.8* 8.4* 8.3*  HCT 27.4*  27.8* 24.8* 26.6* 25.1* 25.7*  PLT 190 165 183 191 205  MCV 89.3 87.6 89.6 89.0 90.2  MCH 30.0 30.0 29.6 29.8 29.1  MCHC 33.6 34.3 33.1 33.5 32.3  RDW 16.5* 16.0* 16.1* 16.2* 16.1*  LYMPHSABS  --  0.8 1.2 1.3 1.0  MONOABS  --  1.5* 1.6* 1.4* 1.2*  EOSABS  --  0.0 0.1 0.1 0.2  BASOSABS  --  0.0 0.1 0.1 0.1    Recent Labs  Lab 01/14/24 1740 01/15/24 0403 01/16/24 0532 01/17/24 0623 01/17/24 1444 01/18/24 0553  NA 132* 130* 128* 133*  --  131*  K 3.4* 3.1* 2.9* 3.5  --  3.9  CL 92* 93* 92* 95*  --  92*  CO2 29 27 28 28   --  21*  ANIONGAP 11 10 8 10   --  18*  GLUCOSE 135* 145* 237* 120*  --  131*  BUN 9 14 21* 18  --  29*  CREATININE 2.33* 3.32* 4.48* 3.63*  --  4.75*  AST 39 40 39 37  --  34  ALT 33 30 30 29   --  24  ALKPHOS 242*  200* 211* 194*  --  188*  BILITOT 2.6* 2.2* 2.1* 2.5*  --  2.0*  ALBUMIN  1.7* 1.5* 1.6* 1.5*  --  <1.5*  CRP  --   --   --   --  23.3*  --   PROCALCITON  --   --   --  16.73  --   --   PHOS  --   --   --   --   --  2.7  CALCIUM  7.9* 7.8* 7.8* 8.0*  --  8.0*      Recent Labs  Lab 01/14/24 1740 01/15/24 0403 01/16/24 0532 01/17/24 0623 01/17/24 1444 01/18/24 0553  CRP  --   --   --   --  23.3*  --   PROCALCITON  --   --   --  16.73  --   --   CALCIUM  7.9* 7.8* 7.8* 8.0*  --  8.0*    --------------------------------------------------------------------------------------------------------------- Lab Results  Component Value Date   CHOL 152 07/27/2020   HDL 36 (L) 07/27/2020   LDLCALC 94 07/27/2020   TRIG 109 07/27/2020   CHOLHDL 4.2 07/27/2020    Lab Results  Component Value Date   HGBA1C 5.4 12/31/2023   No results for input(s): TSH, T4TOTAL, FREET4, T3FREE, THYROIDAB in the last 72 hours. No results for input(s): VITAMINB12, FOLATE, FERRITIN, TIBC, IRON, RETICCTPCT in the last 72 hours. ------------------------------------------------------------------------------------------------------------------ Cardiac Enzymes No results for input(s): CKMB, TROPONINI, MYOGLOBIN in the last 168 hours.  Invalid input(s): CK  Micro Results Recent Results (from the past 240 hours)  Culture, blood (Routine X 2) w Reflex to ID Panel     Status: Abnormal   Collection Time: 01/16/24  7:42 AM   Specimen: BLOOD LEFT ARM  Result Value Ref Range Status   Specimen Description BLOOD LEFT ARM  Final   Special Requests   Final    BOTTLES DRAWN AEROBIC AND ANAEROBIC Blood Culture adequate volume   Culture  Setup Time   Final    GRAM POSITIVE COCCI IN CLUSTERS IN BOTH AEROBIC AND ANAEROBIC BOTTLES CRITICAL RESULT CALLED TO, READ BACK BY AND VERIFIED WITH: PHARMD JAMES W  [id number] FCP    Culture (A)  Final    STAPHYLOCOCCUS EPIDERMIDIS STAPHYLOCOCCUS  HOMINIS THE SIGNIFICANCE OF ISOLATING THIS ORGANISM FROM A SINGLE SET OF BLOOD CULTURES WHEN MULTIPLE SETS ARE DRAWN IS UNCERTAIN. PLEASE NOTIFY THE MICROBIOLOGY DEPARTMENT WITHIN ONE WEEK IF SPECIATION AND SENSITIVITIES ARE REQUIRED. Performed at [facility], [address], [place], [place] [address]    Report Status 01/18/2024 FINAL  Final  Blood  Culture ID Panel (Reflexed)     Status: Abnormal   Collection Time: 01/16/24  7:42 AM  Result Value Ref Range Status   Enterococcus faecalis NOT DETECTED NOT DETECTED Final   Enterococcus Faecium NOT DETECTED NOT DETECTED Final   Listeria monocytogenes NOT DETECTED NOT DETECTED Final   Staphylococcus species DETECTED (A) NOT DETECTED Final    Comment: CRITICAL RESULT CALLED TO, READ BACK BY AND VERIFIED WITH: MAYA LYNWOOD ORN  [id number] FCP    Staphylococcus aureus (BCID) NOT DETECTED NOT DETECTED Final   Staphylococcus epidermidis DETECTED (A) NOT DETECTED Final    Comment: Methicillin (oxacillin) resistant coagulase negative staphylococcus. Possible blood culture contaminant (unless isolated from more than one blood culture draw or clinical case suggests pathogenicity). No antibiotic treatment is indicated for blood  culture contaminants. CRITICAL RESULT CALLED TO, READ BACK BY AND VERIFIED WITH: MAYA LYNWOOD ORN  [id number] FCP    Staphylococcus lugdunensis NOT DETECTED NOT DETECTED Final   Streptococcus species NOT DETECTED NOT DETECTED Final   Streptococcus agalactiae NOT DETECTED NOT DETECTED Final   Streptococcus pneumoniae NOT DETECTED NOT DETECTED Final   Streptococcus pyogenes NOT DETECTED NOT DETECTED Final   A.calcoaceticus-baumannii NOT DETECTED NOT DETECTED Final   Bacteroides fragilis NOT DETECTED NOT DETECTED Final   Enterobacterales NOT DETECTED NOT DETECTED Final   Enterobacter cloacae complex NOT DETECTED NOT DETECTED Final   Escherichia coli NOT DETECTED NOT DETECTED Final   Klebsiella aerogenes NOT DETECTED NOT  DETECTED Final   Klebsiella oxytoca NOT DETECTED NOT DETECTED Final   Klebsiella pneumoniae NOT DETECTED NOT DETECTED Final   Proteus species NOT DETECTED NOT DETECTED Final   Salmonella species NOT DETECTED NOT DETECTED Final   Serratia marcescens NOT DETECTED NOT DETECTED Final   Haemophilus influenzae NOT DETECTED NOT DETECTED Final   Neisseria meningitidis NOT DETECTED NOT DETECTED Final   Pseudomonas aeruginosa NOT DETECTED NOT DETECTED Final   Stenotrophomonas maltophilia NOT DETECTED NOT DETECTED Final   Candida albicans NOT DETECTED NOT DETECTED Final   Candida auris NOT DETECTED NOT DETECTED Final   Candida glabrata NOT DETECTED NOT DETECTED Final   Candida krusei NOT DETECTED NOT DETECTED Final   Candida parapsilosis NOT DETECTED NOT DETECTED Final   Candida tropicalis NOT DETECTED NOT DETECTED Final   Cryptococcus neoformans/gattii NOT DETECTED NOT DETECTED Final   Methicillin resistance mecA/C DETECTED (A) NOT DETECTED Final    Comment: CRITICAL RESULT CALLED TO, READ BACK BY AND VERIFIED WITH: MAYA LYNWOOD ORN  [id number] FCP Performed at [facility], [address], [place], [place] [address]   Culture, blood (Routine X 2) w Reflex to ID Panel     Status: Abnormal (Preliminary result)   Collection Time: 01/16/24  2:54 PM   Specimen: BLOOD  Result Value Ref Range Status   Specimen Description BLOOD SITE NOT SPECIFIED  Final   Special Requests   Final    BOTTLES DRAWN AEROBIC AND ANAEROBIC Blood Culture adequate volume   Culture  Setup Time   Final    GRAM POSITIVE COCCI IN CHAINS AEROBIC BOTTLE ONLY CRITICAL RESULT CALLED TO, READ BACK BY AND VERIFIED WITH: MAYA LYNWOOD ORN [id number] FCP    Culture (A)  Final    STREPTOCOCCUS CONSTELLATUS SUSCEPTIBILITIES TO FOLLOW Performed at [facility], [address], [place], [place] [address]    Report Status PENDING  Incomplete  Blood Culture ID Panel (Reflexed)     Status: Abnormal   Collection Time:  01/16/24  2:54 PM  Result Value Ref Range Status   Enterococcus faecalis NOT DETECTED NOT DETECTED Final   Enterococcus Faecium NOT DETECTED NOT DETECTED Final   Listeria monocytogenes NOT DETECTED NOT DETECTED Final   Staphylococcus species NOT DETECTED NOT DETECTED Final   Staphylococcus aureus (BCID) NOT DETECTED NOT DETECTED Final   Staphylococcus epidermidis NOT DETECTED NOT DETECTED Final   Staphylococcus lugdunensis NOT DETECTED NOT DETECTED Final   Streptococcus species DETECTED (A) NOT DETECTED Final    Comment: Not Enterococcus species, Streptococcus agalactiae, Streptococcus pyogenes, or Streptococcus pneumoniae. CRITICAL RESULT CALLED TO, READ BACK BY AND VERIFIED WITH: MAYA LYNWOOD ORN [id number] FCP    Streptococcus agalactiae NOT DETECTED NOT DETECTED Final   Streptococcus pneumoniae NOT DETECTED NOT DETECTED Final   Streptococcus pyogenes NOT DETECTED NOT DETECTED Final   A.calcoaceticus-baumannii NOT DETECTED NOT DETECTED Final   Bacteroides fragilis NOT DETECTED NOT DETECTED Final   Enterobacterales NOT DETECTED NOT DETECTED Final   Enterobacter cloacae complex NOT DETECTED NOT DETECTED Final   Escherichia coli NOT DETECTED NOT DETECTED Final   Klebsiella aerogenes NOT DETECTED NOT DETECTED Final   Klebsiella oxytoca NOT DETECTED NOT DETECTED Final   Klebsiella pneumoniae NOT DETECTED NOT DETECTED Final   Proteus species NOT DETECTED NOT DETECTED Final   Salmonella species NOT DETECTED NOT DETECTED Final   Serratia marcescens NOT DETECTED NOT DETECTED Final   Haemophilus influenzae NOT DETECTED NOT DETECTED Final   Neisseria meningitidis NOT DETECTED NOT DETECTED Final   Pseudomonas aeruginosa NOT DETECTED NOT DETECTED Final   Stenotrophomonas maltophilia NOT DETECTED NOT DETECTED Final   Candida albicans NOT DETECTED NOT DETECTED Final   Candida auris NOT DETECTED NOT DETECTED Final   Candida glabrata NOT DETECTED NOT DETECTED Final   Candida krusei NOT DETECTED  NOT DETECTED Final   Candida parapsilosis NOT DETECTED NOT DETECTED Final   Candida tropicalis NOT DETECTED NOT DETECTED Final   Cryptococcus neoformans/gattii NOT DETECTED NOT DETECTED Final    Comment: Performed at [facility], [address], [place], [place] [address]  Culture, blood (Routine X 2) w Reflex to ID Panel     Status: None (Preliminary result)   Collection Time: 01/17/24 10:45 AM   Specimen: BLOOD  Result Value Ref Range Status   Specimen Description BLOOD SITE NOT SPECIFIED  Final   Special Requests   Final    BOTTLES DRAWN AEROBIC AND ANAEROBIC Blood Culture adequate volume   Culture   Final    NO GROWTH 2 DAYS Performed at [facility], [address], [place], [place] [address]    Report Status PENDING  Incomplete  Culture, blood (Routine X 2) w Reflex to ID Panel     Status: None (Preliminary result)   Collection Time: 01/17/24 10:45 AM   Specimen: BLOOD  Result Value Ref Range Status   Specimen Description BLOOD SITE NOT SPECIFIED  Final   Special Requests   Final    BOTTLES DRAWN AEROBIC AND ANAEROBIC Blood Culture adequate volume   Culture   Final    NO GROWTH 2 DAYS Performed at [facility], [address], [place], [place] [address]    Report Status PENDING  Incomplete    Radiology Report IR Removal Tun Cv Cath W/O FL Result Date: 01/18/2024 INDICATION: History of ESRD on hemodialysis. New right AV graft placement with successful use. No longer needing tunneled dialysis catheter. Radiology consulted for tunneled dialysis catheter removal. EXAM: REMOVAL TUNNELED HEMODIALYSIS  CATHETER MEDICATIONS: 5 mL 1% lidocaine  ANESTHESIA/SEDATION: None FLUOROSCOPY: None COMPLICATIONS: None immediate. PROCEDURE: Informed written consent was obtained from the patient after a thorough discussion of the procedural risks, benefits and alternatives. All questions were addressed. Maximal Sterile Barrier Technique was utilized including caps, mask, sterile  gowns, sterile gloves, sterile drape, hand hygiene and skin antiseptic. A timeout was performed prior to the initiation of the procedure. The patient's right chest and catheter was prepped and draped in a normal sterile fashion. Heparin  was removed from both ports of catheter. 1% lidocaine  was used for local anesthesia. Using gentle blunt dissection the cuff of the catheter was exposed and the catheter was removed in it's entirety. Pressure was held till hemostasis was obtained. A sterile dressing was applied. The patient tolerated the procedure well with no immediate complications. IMPRESSION: Successful catheter removal as described above. Performed by: Wyatt Pommier, PA-C Electronically Signed   By: Wilkie Lent M.D.   On: 01/18/2024 17:25     Signature  -   Lavada Stank M.D on 01/19/2024 at 9:18 AM   -  To page go to [email or web address]

## 2024-01-19 NOTE — Progress Notes (Signed)
 Daily Progress Note   Patient Name: Jeffrey Young       Date: 01/19/2024 DOB: March 08, 1966  Age: 58 y.o. MRN#: [id number] Attending Physician: Dennise Lavada POUR, MD Primary Care Physician: [facility] Admit Date: 01/14/2024  Reason for Consultation/Follow-up: Establishing goals of care   Length of Stay: 3  Current Medications: Scheduled Meds:   sodium chloride    Intravenous Once   calcitRIOL   0.25 mcg Oral Daily   Chlorhexidine  Gluconate Cloth  6 each Topical Q0600   insulin  aspart  0-6 Units Subcutaneous TID WC   mirtazapine   7.5 mg Oral Daily   pantoprazole  (PROTONIX ) IV  40 mg Intravenous Q12H   sodium chloride  flush  3 mL Intravenous Q12H    Continuous Infusions:  [START ON 01/21/2024] vancomycin       PRN Meds: acetaminophen  **OR** acetaminophen , chlorproMAZINE , fentaNYL  (SUBLIMAZE ) injection, oxyCODONE , prochlorperazine   Physical Exam Vitals reviewed.  Constitutional:      General: He is not in acute distress. HENT:     Head: Normocephalic and atraumatic.  Cardiovascular:     Rate and Rhythm: Normal rate.  Pulmonary:     Effort: Pulmonary effort is normal.  Skin:    General: Skin is warm and dry.  Neurological:     Mental Status: He is alert and oriented to person, place, and time.  Psychiatric:        Mood and Affect: Mood normal.        Behavior: Behavior normal.        Thought Content: Thought content normal.        Judgment: Judgment normal.             Vital Signs: BP (!) 132/54 (BP Location: Left Arm)   Pulse 77   Temp 98.4 F (36.9 C)   Resp (!) 26   Ht 6' 1 (1.854 m)   Wt 68.7 kg   SpO2 100%   BMI 19.98 kg/m  SpO2: SpO2: 100 % O2 Device: O2 Device: Room Air O2 Flow Rate: O2 Flow Rate (L/min): 2 L/min        Patient Active  Problem List   Diagnosis Date Noted   Bacteremia 01/18/2024   Bloodstream infection due to Port-A-Cath 01/18/2024   Hemodialysis catheter infection (HCC) 01/18/2024   Metastatic malignant neoplasm (HCC) 01/18/2024   Hematemesis 01/14/2024  Acute upper GI bleed 12/31/2023   Hyperbilirubinemia 12/31/2023   End-stage renal disease on hemodialysis (HCC) 12/31/2023   Chronic diastolic CHF (congestive heart failure) (HCC) 12/31/2023   History of essential hypertension 12/31/2023   Iron deficiency anemia 05/12/2023   Open wound of left foot 05/12/2023   Essential hypertension 05/11/2023   CKD (chronic kidney disease) stage 4, GFR 15-29 ml/min (HCC) 05/08/2023   Obstructive jaundice due to malignant neoplasm (HCC) 05/07/2023   Malignant neoplasm of colon (HCC)    GI bleed 04/02/2021   Severe sepsis (HCC) 04/02/2021   AKI (acute kidney injury) (HCC) 04/02/2021   Acute metabolic encephalopathy 04/02/2021   Hypokalemia 04/02/2021   Encephalopathy    Nausea and vomiting    Coffee ground emesis    Elevated bilirubin    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 07/31/2020   IDA (iron deficiency anemia) from bleeding colon cancer 07/31/2020   Diabetic retinopathy (HCC) 07/31/2020   Hyperlipidemia 07/31/2020   Low back pain 07/31/2020   Shortness of breath 07/31/2020   Systolic heart failure (HCC) 07/31/2020   DM2 (diabetes mellitus, type 2) (HCC) 07/31/2020   ARF (acute renal failure) (HCC) 07/25/2020   Acute on chronic anemia    New onset of congestive heart failure (HCC) 07/24/2020   GIB (gastrointestinal bleeding) 07/24/2020   Hypertension associated with diabetes (HCC) 06/07/2019   Erectile dysfunction associated with type 2 diabetes mellitus (HCC) 05/08/2019   Sepsis (HCC) 01/22/2018   Diabetic ulcer of left foot (HCC) 01/22/2018   Status post transmetatarsal amputation of left foot (HCC) 01/22/2018   Diabetic mononeuropathy associated with type 2 diabetes mellitus  (HCC) 03/21/2017   Microalbuminuria due to type 2 diabetes mellitus (HCC) 03/21/2017   Vitamin D deficiency 04/06/2009   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 male  with past medical history of ESRD (MWF), colon cancer s/p partial colectomy, HFpEF, DM, transmetatarsal amputation left foot, anemia (baseline 7-9), presenting with acute upper GI bleed and worsening anemia.  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    Palliative medicine was consulted for GOC conversations.  Today's Discussion: Patient sleeping in no apparent distress but he easily woke up when I called his name. Patient shared he received bad news from Dr. Cloretta yesterday. We discussed the metastasis of his colon cancer and limitation of systemic therapies at this time due to the patient's performance status.  Patient shared that over the last month and a half his functional status has declined greatly.  Previously he was walking independently and able to complete ADLs and IADLs.  He is now using a walker to ambulate short distances.  He shared that dialysis has been a lot.  His appetite has also decreased and he is eating about one fourth of his meals.  I shared my concern that he may not be able to improve his performance status enough to receive chemotherapy.  He is also worried about this.  He has been receiving cancer therapy for several years and also knows how difficult chemotherapy can be. We discussed options moving forward.  We  discussed the difference between an aggressive treatment path  and a comfort focused path. We discussed his end stage renal disease and the natural disease trajectory if he decided to stop dialysis including expectations at EOL. We discussed potential locations for end-of-life care. The patient understandably has a lot to consider and requests time to do so. He agrees to allow me to call his wife Jeffrey Young to set up a meeting to discuss goals of care moving forward.  Spoke to patient's wife Jeffrey Young by phone. Reviewed my discussion with the patient. Jeffrey Young is agreeable to meeting to discuss goals of care. She is the patient's proxy decision maker and would like to clarify his medical wishes. We agree to meet Sunday at 1:30 pm. Emotional support and therapeutic listening provided.  Recommendations/Plan: Full code Full scope of care Time to consider options GOC meeting planned Sunday 8/31 at 1:30 pm Palliative medicine will continue to follow    Code Status:    Code Status Orders  (From admission, onward)           Start     Ordered   01/14/24 2012  Full code  Continuous       Question:  By:  Answer:  Consent: discussion documented in EHR   01/14/24 2012           Extensive chart review has been completed prior to seeing the patient including labs, vital signs, imaging, progress/consult notes, orders, medications, and available advance directive documents.   Care plan was discussed with Dr. Dennise  Time spent:80 minutes  Thank you for allowing the Palliative Medicine Team to assist in the care of this patient.    Stephane CHRISTELLA Palin, NP  Please contact Palliative Medicine Team phone at [phone] for questions and concerns.

## 2024-01-19 NOTE — Progress Notes (Signed)
 Jeffrey Young is an 58 y.o. 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 Admitted due to hematemesis. On admission his hgb was 6.6. He was given 1 unit of PRBC. EGD 01/15/2024   Dialysis Orders: [facility] MWF EDW 74 kg - reported that he is losing weight and has poor appetite 4 hours 3K/2.5 Ca bath 400/600 No ESA due to malignancy Hectorol 1 mcg  Assessment/Plan:  Upper GI bleed: patient had episode of hematemesis post HD on 01/14/24. Hgb dropped to 6.6 and he was given 1 unit of PRBC in ED.  Patient just hospitalized for similar episode on 8/12-8/14 and had stent placed during ERCP. EGD 01/15/2024 showed a gastric ulcer with adherent clot, reflux esophagitis but no active bleed.   ESRD:  On HD at [facility] clinic MWF.  TDC removed on 8/29 and has R AVG ready for use 01/17/24 (surgical date on 12/06/2023).   No issues at all with 2 16 ga needles cannulating the right upper arm AV graft on 8/29. Plan next treatment for Monday; no indication today.  HFpEF: does not appear to be volume overloaded Hyperbilirubinemia: elevated on presentation but showing mild improvement. CT noted increase in intrahepatic vile duct dilation compared from CT 10/13/23. Appears to be second to pancreatic mass or lymphadenopathy. Had stent replaced 01/02/24 by Dr. Rollin.   Hypertension/volume: BP fairly well-controlled today. Reported that he has been losing weight from his home unit. He has had his EDW lowered multiple times. He was recently placed on Mirtazapine . Patient appears euvolemic on exam.   Anemia: Not on ESA due to malignancy. He has received 1 U PRBC so far with this hospitalization. Last Hgb 6.6 -> 9.2 -> 8.5. No venofer due to high ferritin level.  Metabolic bone  disease: On hectorol 1 mcg. No calcitriol . Corrected Ca 9.8.   Nutrition:  Albumin  1.5. Starting protein supplements when he is cleared for diet.  DM: on SSI per primary.   Subjective: Lethargic but easily arousable, breathing comfortably.  Patient had a Tmax of 102.3 overnight on Wed, tmax over night 99.4 .  He denies any diarrhea,  abdominal pain; has intermittent cough.  He states that his appetite actually improved last night.   Chemistry and CBC: Creatinine  Date/Time Value Ref Range Status  01/10/2024 08:00 AM 3.13 (H) 0.61 - 1.24 mg/dL Final  92/98/7974 88:74 AM 6.41 (H) 0.61 - 1.24 mg/dL Final  93/81/7974 89:94 AM 6.79 (H) 0.61 - 1.24 mg/dL Final  94/80/7974 91:89 AM 5.73 (H) 0.61 - 1.24 mg/dL Final  95/77/7974 90:76 AM 6.66 (H) 0.61 - 1.24 mg/dL Final  95/89/7974 91:79 AM 6.11 (H) 0.61 - 1.24 mg/dL Final  97/82/7974 98:88 PM 5.59 (H) 0.61 - 1.24 mg/dL Final  98/78/7974 89:68 AM 4.48 (H) 0.61 - 1.24 mg/dL Final  98/93/7974 88:56 AM 5.42 (H) 0.61 - 1.24 mg/dL Final  93/82/7975 91:85 AM 3.53 (H) 0.61 - 1.24 mg/dL Final  95/98/7975 88:86 AM 3.11 (H) 0.61 - 1.24 mg/dL Final  97/83/7975 91:91 AM 3.17 (H) 0.61 - 1.24 mg/dL Final  87/70/7976 89:59 AM 2.94 (H) 0.61 - 1.24 mg/dL Final  88/86/7976 89:93 AM 3.01 (HH) 0.61 - 1.24 mg/dL Final    Comment:    CRITICAL RESULT CALLED TO, READ BACK BY AND VERIFIED WITH: CT SUSAN  COWARD @ 1134. 04/03/2022.klj   11/07/2021 08:31 AM 2.83 (H) 0.61 - 1.24 mg/dL Final  89/89/7977 87:87 PM 1.72 (H) 0.61 - 1.24 mg/dL Final  90/72/7977 91:73 AM 1.82 (H) 0.61 - 1.24 mg/dL Final  90/87/7977 91:56 AM 1.34 (H) 0.61 - 1.24 mg/dL Final  91/70/7977 91:84 AM 1.54 (H) 0.61 - 1.24 mg/dL Final  91/84/7977 90:89 AM 1.34 (H) 0.61 - 1.24 mg/dL Final  91/98/7977 87:69 PM 1.75 (H) 0.61 - 1.24 mg/dL Final  92/81/7977 91:69 AM 1.36 (H) 0.61 - 1.24 mg/dL Final  92/94/7977 91:80 AM 1.58 (H) 0.61 - 1.24 mg/dL Final  93/79/7977 91:79 AM 1.77 (H) 0.61 - 1.24 mg/dL Final   93/86/7977 91:99 AM 1.64 (H) 0.61 - 1.24 mg/dL Final  93/93/7977 91:71 AM 1.79 (H) 0.61 - 1.24 mg/dL Final  94/76/7977 91:50 AM 2.00 (H) 0.61 - 1.24 mg/dL Final  94/90/7977 91:66 AM 2.10 (H) 0.61 - 1.24 mg/dL Final  95/74/7977 91:99 AM 2.21 (H) 0.61 - 1.24 mg/dL Final  95/88/7977 90:72 AM 2.60 (H) 0.61 - 1.24 mg/dL Final  96/71/7977 92:48 AM 2.01 (H) 0.61 - 1.24 mg/dL Final   Creatinine, Ser  Date/Time Value Ref Range Status  01/18/2024 05:53 AM 4.75 (H) 0.61 - 1.24 mg/dL Final  91/71/7974 93:76 AM 3.63 (H) 0.61 - 1.24 mg/dL Final  91/72/7974 94:67 AM 4.48 (H) 0.61 - 1.24 mg/dL Final  91/73/7974 95:96 AM 3.32 (H) 0.61 - 1.24 mg/dL Final  91/74/7974 94:59 PM 2.33 (H) 0.61 - 1.24 mg/dL Final  91/86/7974 95:50 AM 4.38 (H) 0.61 - 1.24 mg/dL Final  91/87/7974 97:48 AM 3.01 (H) 0.61 - 1.24 mg/dL Final  91/88/7974 93:65 PM 1.90 (H) 0.61 - 1.24 mg/dL Final  91/88/7974 93:93 PM 2.09 (H) 0.61 - 1.24 mg/dL Final  92/82/7974 90:44 AM 3.70 (H) 0.61 - 1.24 mg/dL Final  87/68/7975 94:99 AM 5.06 (H) 0.61 - 1.24 mg/dL Final  87/69/7975 97:43 AM 5.98 (H) 0.61 - 1.24 mg/dL Final  87/70/7975 96:92 AM 5.89 (H) 0.61 - 1.24 mg/dL Final  87/71/7975 96:59 AM 5.36 (H) 0.61 - 1.24 mg/dL Final  87/72/7975 87:60 PM 5.70 (H) 0.61 - 1.24 mg/dL Final  87/72/7975 96:80 AM 4.46 (H) 0.61 - 1.24 mg/dL Final  87/73/7975 97:43 AM 4.37 (H) 0.61 - 1.24 mg/dL Final  87/74/7975 96:83 AM 4.37 (H) 0.61 - 1.24 mg/dL Final  87/75/7975 98:87 AM 4.30 (H) 0.61 - 1.24 mg/dL Final  87/76/7975 97:82 AM 4.23 (H) 0.61 - 1.24 mg/dL Final  87/77/7975 95:83 AM 4.71 (H) 0.61 - 1.24 mg/dL Final  87/78/7975 96:95 AM 4.84 (H) 0.61 - 1.24 mg/dL Final  87/79/7975 94:92 PM 4.72 (H) 0.61 - 1.24 mg/dL Final  87/79/7975 96:81 AM 3.37 (H) 0.61 - 1.24 mg/dL Final  87/80/7975 95:93 AM 4.14 (H) 0.61 - 1.24 mg/dL Final  87/81/7975 96:80 AM 4.18 (H) 0.61 - 1.24 mg/dL Final  87/82/7975 95:89 AM 4.75 (H) 0.61 - 1.24 mg/dL Final  87/83/7975 89:76 AM  5.02 (H) 0.61 - 1.24 mg/dL Final  88/76/7977 96:54 AM 1.56 (H) 0.61 - 1.24 mg/dL Final  88/77/7977 95:74 AM 1.76 (H) 0.61 - 1.24 mg/dL Final  88/78/7977 96:99 AM 1.55 (H) 0.61 - 1.24 mg/dL Final  88/79/7977 96:46 AM 1.43 (H) 0.61 - 1.24 mg/dL Final  88/80/7977 94:66 AM 1.39 (H) 0.61 - 1.24 mg/dL Final  88/81/7977 96:61 AM 1.68 (H) 0.61 - 1.24 mg/dL Final  88/82/7977 96:68 AM 1.57 (H) 0.61 - 1.24 mg/dL Final  88/83/7977 94:57 AM 1.52 (H) 0.61 - 1.24 mg/dL Final  04/05/2021 03:59 PM 1.73 (H) 0.61 - 1.24 mg/dL Final  88/85/7977 95:66 AM 1.80 (H) 0.61 - 1.24 mg/dL Final  88/86/7977 95:63 AM 1.92 (H) 0.61 - 1.24 mg/dL Final  88/87/7977 94:41 AM 2.09 (H) 0.61 - 1.24 mg/dL Final  88/88/7977 90:69 PM 2.18 (H) 0.61 - 1.24 mg/dL Final  95/69/7977 92:52 PM 2.20 (H) 0.61 - 1.24 mg/dL Final  96/84/7977 89:57 AM 2.13 (H) 0.61 - 1.24 mg/dL Final  96/85/7977 95:44 AM 2.30 (H) 0.61 - 1.24 mg/dL Final  96/86/7977 94:63 AM 2.55 (H) 0.61 - 1.24 mg/dL Final  96/87/7977 87:78 PM 2.53 (H) 0.61 - 1.24 mg/dL Final  96/87/7977 94:83 AM 2.41 (H) 0.61 - 1.24 mg/dL Final  96/88/7977 95:46 AM 1.81 (H) 0.61 - 1.24 mg/dL Final  96/89/7977 94:64 AM 2.12 (H) 0.61 - 1.24 mg/dL Final  96/90/7977 94:94 AM 1.85 (H) 0.61 - 1.24 mg/dL Final  96/91/7977 93:98 AM 1.65 (H) 0.61 - 1.24 mg/dL Final  96/92/7977 94:85 AM 1.95 (H) 0.61 - 1.24 mg/dL Final   Recent Labs  Lab 01/14/24 1740 01/15/24 0403 01/16/24 0532 01/17/24 0623 01/18/24 0553  NA 132* 130* 128* 133* 131*  K 3.4* 3.1* 2.9* 3.5 3.9  CL 92* 93* 92* 95* 92*  CO2 29 27 28 28  21*  GLUCOSE 135* 145* 237* 120* 131*  BUN 9 14 21* 18 29*  CREATININE 2.33* 3.32* 4.48* 3.63* 4.75*  CALCIUM  7.9* 7.8* 7.8* 8.0* 8.0*  PHOS  --   --   --   --  2.7   Recent Labs  Lab 01/15/24 1306 01/16/24 0532 01/17/24 0623 01/18/24 0553  WBC 15.9* 13.8* 16.1* 14.6*  NEUTROABS  --  11.3* 13.0* 11.6*  HGB 9.2*  9.3* 8.5* 8.8* 8.4*  HCT 27.4*  27.8* 24.8* 26.6* 25.1*  MCV 89.3  87.6 89.6 89.0  PLT 190 165 183 191   Liver Function Tests: Recent Labs  Lab 01/16/24 0532 01/17/24 0623 01/18/24 0553  AST 39 37 34  ALT 30 29 24   ALKPHOS 211* 194* 188*  BILITOT 2.1* 2.5* 2.0*  PROT 5.5* 5.6* 5.4*  ALBUMIN  1.6* 1.5* <1.5*   No results for input(s): LIPASE, AMYLASE in the last 168 hours. No results for input(s): AMMONIA in the last 168 hours. Cardiac Enzymes: No results for input(s): CKTOTAL, CKMB, CKMBINDEX, TROPONINI in the last 168 hours. Iron Studies: No results for input(s): IRON, TIBC, TRANSFERRIN, FERRITIN in the last 72 hours. PT/INR: @LABRCNTIP (inr:5)  Xrays/Other Studies: ) Results for orders placed or performed during the hospital encounter of 01/14/24 (from the past 48 hours)  Glucose, capillary     Status: Abnormal   Collection Time: 01/17/24  7:50 AM  Result Value Ref Range   Glucose-Capillary 130 (H) 70 - 99 mg/dL    Comment: Glucose reference range applies only to samples taken after fasting for at least 8 hours.  Culture, blood (Routine X 2) w Reflex to ID Panel     Status: None (Preliminary result)   Collection Time: 01/17/24 10:45 AM   Specimen: BLOOD  Result Value Ref Range   Specimen Description BLOOD SITE NOT SPECIFIED    Special Requests      BOTTLES DRAWN AEROBIC AND ANAEROBIC Blood Culture adequate volume   Culture      NO GROWTH < 24 HOURS Performed at [facility], [address], [place], [place] [address]    Report Status PENDING   Culture, blood (Routine X 2) w Reflex to ID Panel  Status: None (Preliminary result)   Collection Time: 01/17/24 10:45 AM   Specimen: BLOOD  Result Value Ref Range   Specimen Description BLOOD SITE NOT SPECIFIED    Special Requests      BOTTLES DRAWN AEROBIC AND ANAEROBIC Blood Culture adequate volume   Culture      NO GROWTH < 24 HOURS Performed at [facility], [address], [place], [place] [address]    Report Status PENDING   Glucose, capillary      Status: Abnormal   Collection Time: 01/17/24 11:36 AM  Result Value Ref Range   Glucose-Capillary 134 (H) 70 - 99 mg/dL    Comment: Glucose reference range applies only to samples taken after fasting for at least 8 hours.  C-reactive protein     Status: Abnormal   Collection Time: 01/17/24  2:44 PM  Result Value Ref Range   CRP 23.3 (H) <1.0 mg/dL    Comment: Performed at [facility], [address], [place], [place] [address]  Glucose, capillary     Status: Abnormal   Collection Time: 01/17/24  3:27 PM  Result Value Ref Range   Glucose-Capillary 175 (H) 70 - 99 mg/dL    Comment: Glucose reference range applies only to samples taken after fasting for at least 8 hours.  Glucose, capillary     Status: Abnormal   Collection Time: 01/17/24  9:47 PM  Result Value Ref Range   Glucose-Capillary 120 (H) 70 - 99 mg/dL    Comment: Glucose reference range applies only to samples taken after fasting for at least 8 hours.  CBC with Differential/Platelet     Status: Abnormal   Collection Time: 01/18/24  5:53 AM  Result Value Ref Range   WBC 14.6 (H) 4.0 - 10.5 K/uL   RBC 2.82 (L) 4.22 - 5.81 MIL/uL   Hemoglobin 8.4 (L) 13.0 - 17.0 g/dL   HCT 74.8 (L) 60.9 - 47.9 %   MCV 89.0 80.0 - 100.0 fL   MCH 29.8 26.0 - 34.0 pg   MCHC 33.5 30.0 - 36.0 g/dL   RDW 83.7 (H) 88.4 - 84.4 %   Platelets 191 150 - 400 K/uL   nRBC 0.0 0.0 - 0.2 %   Neutrophils Relative % 78 %   Neutro Abs 11.6 (H) 1.7 - 7.7 K/uL   Lymphocytes Relative 9 %   Lymphs Abs 1.3 0.7 - 4.0 K/uL   Monocytes Relative 10 %   Monocytes Absolute 1.4 (H) 0.1 - 1.0 K/uL   Eosinophils Relative 1 %   Eosinophils Absolute 0.1 0.0 - 0.5 K/uL   Basophils Relative 1 %   Basophils Absolute 0.1 0.0 - 0.1 K/uL   Immature Granulocytes 1 %   Abs Immature Granulocytes 0.16 (H) 0.00 - 0.07 K/uL    Comment: Performed at [facility], [address], [place], [place] [address]  Comprehensive metabolic panel with GFR     Status:  Abnormal   Collection Time: 01/18/24  5:53 AM  Result Value Ref Range   Sodium 131 (L) 135 - 145 mmol/L   Potassium 3.9 3.5 - 5.1 mmol/L   Chloride 92 (L) 98 - 111 mmol/L   CO2 21 (L) 22 - 32 mmol/L   Glucose, Bld 131 (H) 70 - 99 mg/dL    Comment: Glucose reference range applies only to samples taken after fasting for at least 8 hours.   BUN 29 (H) 6 - 20 mg/dL   Creatinine, Ser 5.24 (  H) 0.61 - 1.24 mg/dL   Calcium  8.0 (L) 8.9 - 10.3 mg/dL   Total Protein 5.4 (L) 6.5 - 8.1 g/dL   Albumin  <1.5 (L) 3.5 - 5.0 g/dL   AST 34 15 - 41 U/L   ALT 24 0 - 44 U/L   Alkaline Phosphatase 188 (H) 38 - 126 U/L   Total Bilirubin 2.0 (H) 0.0 - 1.2 mg/dL   GFR, Estimated 13 (L) >60 mL/min    Comment: (NOTE) Calculated using the CKD-EPI Creatinine Equation (2021)    Anion gap 18 (H) 5 - 15    Comment: Performed at [facility], [address], [place], [place] [address]  Phosphorus     Status: None   Collection Time: 01/18/24  5:53 AM  Result Value Ref Range   Phosphorus 2.7 2.5 - 4.6 mg/dL    Comment: Performed at [facility], [address] [place], [place] [address]  Glucose, capillary     Status: Abnormal   Collection Time: 01/18/24  7:51 AM  Result Value Ref Range   Glucose-Capillary 136 (H) 70 - 99 mg/dL    Comment: Glucose reference range applies only to samples taken after fasting for at least 8 hours.  Glucose, capillary     Status: Abnormal   Collection Time: 01/18/24  3:33 PM  Result Value Ref Range   Glucose-Capillary 134 (H) 70 - 99 mg/dL    Comment: Glucose reference range applies only to samples taken after fasting for at least 8 hours.  Glucose, capillary     Status: Abnormal   Collection Time: 01/18/24  9:37 PM  Result Value Ref Range   Glucose-Capillary 180 (H) 70 - 99 mg/dL    Comment: Glucose reference range applies only to samples taken after fasting for at least 8 hours.   IR Removal Tun Cv Cath W/O FL Result Date: 01/18/2024 INDICATION: History of ESRD  on hemodialysis. New right AV graft placement with successful use. No longer needing tunneled dialysis catheter. Radiology consulted for tunneled dialysis catheter removal. EXAM: REMOVAL TUNNELED HEMODIALYSIS CATHETER MEDICATIONS: 5 mL 1% lidocaine  ANESTHESIA/SEDATION: None FLUOROSCOPY: None COMPLICATIONS: None immediate. PROCEDURE: Informed written consent was obtained from the patient after a thorough discussion of the procedural risks, benefits and alternatives. All questions were addressed. Maximal Sterile Barrier Technique was utilized including caps, mask, sterile gowns, sterile gloves, sterile drape, hand hygiene and skin antiseptic. A timeout was performed prior to the initiation of the procedure. The patient's right chest and catheter was prepped and draped in a normal sterile fashion. Heparin  was removed from both ports of catheter. 1% lidocaine  was used for local anesthesia. Using gentle blunt dissection the cuff of the catheter was exposed and the catheter was removed in it's entirety. Pressure was held till hemostasis was obtained. A sterile dressing was applied. The patient tolerated the procedure well with no immediate complications. IMPRESSION: Successful catheter removal as described above. Performed by: Wyatt Pommier, PA-C Electronically Signed   By: Wilkie Lent M.D.   On: 01/18/2024 17:25    PMH:   Past Medical History:  Diagnosis Date   Arthritis    Hip   CHF (congestive heart failure) (HCC)    Colon cancer (HCC)    Diabetic mononeuropathy associated with type 2 diabetes mellitus (HCC) 03/21/2017   Diabetic retinopathy (HCC) 07/31/2020   Enthesopathy of ankle and tarsus 04/02/2009   Formatting of this note might be different from the original. 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 Type 2 Diabetes Mellitus - Uncomplicated, Uncontrolled    PSH:   Past Surgical History:  Procedure Laterality Date   AMPUTATION Left 01/22/2018   Procedure: TRANSMETATARSAL AMPUTATION;  Surgeon: Harden Jerona GAILS, MD;  Location: [facility] OR;  Service: Orthopedics;  Laterality: Left;toes   BILIARY STENT PLACEMENT N/A 05/10/2023   Procedure: BILIARY STENT PLACEMENT;  Surgeon: Rollin Dover, MD;  Location: [facility] ENDOSCOPY;  Service: Gastroenterology;  Laterality: N/A;   BILIARY STENT PLACEMENT N/A 05/18/2023   Procedure: BILIARY STENT PLACEMENT;  Surgeon: Rollin Dover, MD;  Location: [facility] ENDOSCOPY;  Service: Gastroenterology;  Laterality: N/A;   BILIARY STENT PLACEMENT  01/02/2024   Procedure: INSERTION, STENT, BILE DUCT;  Surgeon: Rollin Dover, MD;  Location: [facility] ENDOSCOPY;  Service: Gastroenterology;;   BIOPSY  07/27/2020   Procedure: BIOPSY;  Surgeon: Rollin Dover, MD;  Location: [facility] ENDOSCOPY;  Service: Endoscopy;;   COLON RESECTION N/A 07/30/2020   Procedure: HAND ASSISTED LAPAROSCOPIC LEFT HEMI COLECTOMY;  Surgeon: Gladis Cough, MD;  Location: [facility] ORS;  Service: General;  Laterality: N/A;   COLONOSCOPY WITH PROPOFOL  N/A 05/24/2018   Procedure: COLONOSCOPY WITH PROPOFOL ;  Surgeon: Rollin Dover, MD;  Location: [facility] ENDOSCOPY;  Service: Endoscopy;  Laterality: N/A;   COLONOSCOPY WITH PROPOFOL  N/A 07/27/2020   Procedure: COLONOSCOPY WITH PROPOFOL ;  Surgeon: Rollin Dover, MD;  Location: [facility]  ENDOSCOPY;  Service: Endoscopy;  Laterality: N/A;   DIALYSIS/PERMA CATHETER INSERTION N/A 11/19/2023   Procedure: DIALYSIS/PERMA CATHETER INSERTION;  Surgeon: [name] [name], MD;  Location: [facility];  Service: Cardiovascular;  Laterality: N/A;   ERCP N/A 05/10/2023   Procedure: ENDOSCOPIC RETROGRADE CHOLANGIOPANCREATOGRAPHY (ERCP);  Surgeon: Rollin Dover, MD;  Location: [facility];  Service: Gastroenterology;  Laterality: N/A;   ERCP N/A 05/18/2023   Procedure: ENDOSCOPIC RETROGRADE CHOLANGIOPANCREATOGRAPHY (ERCP);  Surgeon: Rollin Dover, MD;  Location: [facility];  Service: Gastroenterology;  Laterality: N/A;   ERCP N/A 01/02/2024   Procedure: ERCP, WITH INTERVENTION IF INDICATED;  Surgeon: Rollin Dover, MD;  Location: [facility] ENDOSCOPY;  Service: Gastroenterology;  Laterality: N/A;   ESOPHAGOGASTRODUODENOSCOPY Left 04/05/2021   Procedure: ESOPHAGOGASTRODUODENOSCOPY (EGD);  Surgeon: Rollin Dover, MD;  Location: [facility];  Service: Endoscopy;  Laterality: Left;   ESOPHAGOGASTRODUODENOSCOPY N/A 05/10/2023   Procedure: ESOPHAGOGASTRODUODENOSCOPY (EGD);  Surgeon: Rollin Dover, MD;  Location: [facility];  Service: Gastroenterology;  Laterality: N/A;   ESOPHAGOGASTRODUODENOSCOPY N/A 01/15/2024   Procedure: EGD (ESOPHAGOGASTRODUODENOSCOPY);  Surgeon: Rollin Dover, MD;  Location: [facility] ENDOSCOPY;  Service: Gastroenterology;  Laterality: N/A;   EUS N/A 05/10/2023   Procedure: UPPER ENDOSCOPIC ULTRASOUND (EUS) LINEAR;  Surgeon: Rollin Dover, MD;  Location: [facility];  Service: Gastroenterology;  Laterality: N/A;   FINE NEEDLE ASPIRATION N/A 05/10/2023   Procedure: FINE NEEDLE ASPIRATION (FNA) LINEAR;  Surgeon: Rollin Dover, MD;  Location: [facility];  Service: Gastroenterology;  Laterality: N/A;  INSERTION OF ARTERIOVENOUS (AV) ARTEGRAFT ARM Right 12/06/2023   Procedure: INSERTION, GRAFT, ARTERIOVENOUS, UPPER EXTREMITY;  Surgeon: [name] [name], MD;  Location: [facility] OR;  Service: Vascular;   Laterality: Right;   INSERTION OF DIALYSIS CATHETER Right 12/06/2023   Procedure: EXCHANGE OF DIALYSIS CATHETER USING PALINDROME 23CM CATHETER KIT;  Surgeon: [name] [name], MD;  Location: [facility] OR;  Service: Vascular;  Laterality: Right;   IR REMOVAL TUN CV CATH W/O FL  01/18/2024   POLYPECTOMY  05/24/2018   Procedure: POLYPECTOMY;  Surgeon: Rollin Dover, MD;  Location: [facility] ENDOS[facility];  Service: Endoscopy;;   PORTACATH PLACEMENT Left 08/24/2020   Procedure: INSERTION PORT-A-CATH;  Surgeon: Gladis Cough, MD;  Location: [facility] ORS;  Service: General;  Laterality: Left;  75/rm1   SPHINCTEROTOMY  05/10/2023   Procedure: ANNETT;  Surgeon: Rollin Dover, MD;  Location: [facility];  Service: Gastroenterology;;   ANNETT  05/18/2023   Procedure: ANNETT;  Surgeon: Rollin Dover, MD;  Location: [facility];  Service: Gastroenterology;;   CLEDA REMOVAL  05/18/2023   Procedure: STENT REMOVAL;  Surgeon: Rollin Dover, MD;  Location: [facility];  Service: Gastroenterology;;   CLEDA REMOVAL  01/02/2024   Procedure: STENT REMOVAL;  Surgeon: Rollin Dover, MD;  Location: [facility] ENDOSCOPY;  Service: Gastroenterology;;   SUBMUCOSAL TATTOO INJECTION  07/27/2020   Procedure: SUBMUCOSAL TATTOO INJECTION;  Surgeon: Rollin Dover, MD;  Location: [facility] ENDOSCOPY;  Service: Endoscopy;;    Allergies:  Allergies  Allergen Reactions   Bee Venom Anaphylaxis, Swelling and Other (See Comments)    Cold Sweats, also   Latex Rash and Dermatitis    Medications:   Prior to Admission medications   Medication Sig Start Date End Date Taking? Authorizing Provider  amLODipine  (NORVASC ) 10 MG tablet Take 10 mg by mouth daily.   Yes [provider]  baclofen  (LIORESAL ) 10 MG tablet Take 10 mg by mouth daily as needed for muscle spasms.   Yes [provider]  cadexomer iodine  (IODOSORB) 0.9 % gel Apply 1 Application topically daily as needed for wound care.   Yes [provider]  carvedilol   (COREG ) 3.125 MG tablet Take 1 tablet (3.125 mg total) by mouth 2 (two) times daily with a meal. 01/03/24  Yes Dennise Lavada POUR, MD  EPINEPHrine  0.3 mg/0.3 mL IJ SOAJ injection Inject 0.3 mg into the muscle as needed for anaphylaxis.   Yes [provider]  ergocalciferol (VITAMIN D2) 1.25 MG (50000 UT) capsule Take 50,000 Units by mouth once a week. Take on Monday   Yes [provider]  ferrous sulfate 325 (65 FE) MG EC tablet Take 325 mg by mouth daily with breakfast.   Yes [provider]  furosemide  (LASIX ) 20 MG tablet Take 20 mg by mouth.   Yes [provider]  LANTUS  SOLOSTAR 100 UNIT/ML Solostar Pen Inject 10-15 Units into the skin See admin instructions. Inject 15 units into the skin in the morning and 10 units into the skin at bedtime   Yes [provider]  loperamide (IMODIUM) 2 MG capsule Take 2 mg by mouth daily as needed for diarrhea or loose stools.   Yes [provider]  losartan (COZAAR) 50 MG tablet Take 50 mg by mouth daily. 01/11/24  Yes [provider]  mirtazapine  (REMERON ) 15 MG tablet Take 7.5 mg by mouth at bedtime. 12/26/23  Yes [provider]  Nutritional Supplements (FEEDING SUPPLEMENT, NEPRO CARB STEADY,) LIQD Take 237 mLs by mouth daily. Mixed berry   Yes [provider]  Sodium Chloride , GU Irrigant, (0.9 % SODIUM CHLORIDE , POUR BTL, OPTIME) Irrigate with 1,000 mLs as directed daily.   Yes [provider]    Discontinued Meds:   Medications Discontinued During This Encounter  Medication Reason   ACCU-CHEK GUIDE test strip    Continuous Glucose Sensor (DEXCOM G6 SENSOR) MISC    Continuous Glucose Transmitter (DEXCOM G6 TRANSMITTER) MISC    oxyCODONE  (ROXICODONE ) 5 MG immediate release tablet Completed Course   pantoprazole  (PROTONIX ) 40 MG tablet Completed Course   prochlorperazine  (COMPAZINE ) 10 MG tablet Change in therapy   calcitRIOL  (ROCALTROL ) 0.25 MCG capsule Change in  therapy   0.9 %  sodium chloride  infusion Patient Transfer   insulin  aspart (novoLOG ) injection 0-6 Units    pentafluoroprop-tetrafluoroeth (GEBAUERS) aerosol 1 Application Patient Transfer   lidocaine  (PF) (XYLOCAINE ) 1 % injection 5 mL Patient Transfer   lidocaine -prilocaine  (EMLA ) cream 1 Application Patient Transfer   heparin  injection 1,000 Units Patient Transfer   anticoagulant sodium citrate  solution 5 mL Patient Transfer   alteplase  (CATHFLO ACTIVASE ) injection 2 mg Patient Transfer   ampicillin -sulbactam (UNASYN ) 1.5 g in sodium chloride  0.9 % 100 mL IVPB    vancomycin  (VANCOREADY) IVPB 750 mg/150 mL    cefTRIAXone  (ROCEPHIN ) 2 g in sodium chloride  0.9 % 100 mL IVPB    metroNIDAZOLE  (FLAGYL ) tablet 500 mg     Social History:  reports that he has never smoked. He has never used smokeless tobacco. He reports current alcohol use of about 1.0 standard drink of alcohol per week. He reports that he does not use drugs.  Family History:   Family History  Problem Relation Age of Onset   Hypertension Father     Blood pressure (!) 117/47, pulse (!) 167, temperature 99.3 F (37.4 C), temperature source Oral, resp. rate 16, height 6' 1 (1.854 m), weight 68.7 kg, SpO2 94%. Physical Exam: GENERAL:  Lethargic but easily arousable, pleasant, NAD  HEENT:  EOMI CARDIOVASCULAR:  RRR, no murmurs appreciated RESPIRATORY:  Clear to auscultation GASTROINTESTINAL:  Soft, nontender, nondistended EXTREMITIES:  No LE edema bilaterally NEURO:  No new focal deficits appreciated SKIN:  No rashes noted PSYCH:  Appropriate mood and affect Dialysis access: TDC removed on 8/29, right upper arm AV graft with a very nice thrill ready to be used on 01/17/2024 (placed 12/06/2023) -> 2 needles on 8/29     MELIA LYNWOOD ORN, MD 01/19/2024, 7:36 AM

## 2024-01-19 NOTE — Progress Notes (Signed)
      Cultures drawn thru HD catheter also so far NGTD  If patient is stable I would be comfortable DC him with 2 weeks of IV vancomycin  with HD  I can follow-up his cultures and could always bring him back if labs were compelling for true bacteremia. I am very skeptical that he has this.  Jomarie Fleeta Rothman 01/19/2024, 9:50 PM

## 2024-01-20 DIAGNOSIS — C189 Malignant neoplasm of colon, unspecified: Secondary | ICD-10-CM | POA: Diagnosis not present

## 2024-01-20 LAB — COMPREHENSIVE METABOLIC PANEL WITH GFR
ALT: 17 U/L (ref 0–44)
AST: 22 U/L (ref 15–41)
Albumin: 1.5 g/dL — ABNORMAL LOW (ref 3.5–5.0)
Alkaline Phosphatase: 224 U/L — ABNORMAL HIGH (ref 38–126)
Anion gap: 13 (ref 5–15)
BUN: 25 mg/dL — ABNORMAL HIGH (ref 6–20)
CO2: 24 mmol/L (ref 22–32)
Calcium: 8.1 mg/dL — ABNORMAL LOW (ref 8.9–10.3)
Chloride: 92 mmol/L — ABNORMAL LOW (ref 98–111)
Creatinine, Ser: 4.64 mg/dL — ABNORMAL HIGH (ref 0.61–1.24)
GFR, Estimated: 14 mL/min — ABNORMAL LOW (ref >60)
Glucose, Bld: 185 mg/dL — ABNORMAL HIGH (ref 70–99)
Potassium: 3.6 mmol/L (ref 3.5–5.1)
Sodium: 129 mmol/L — ABNORMAL LOW (ref 135–145)
Total Bilirubin: 1.6 mg/dL — ABNORMAL HIGH (ref 0.0–1.2)
Total Protein: 5.5 g/dL — ABNORMAL LOW (ref 6.5–8.1)

## 2024-01-20 LAB — GLUCOSE, CAPILLARY
Glucose-Capillary: 121 mg/dL — ABNORMAL HIGH (ref 70–99)
Glucose-Capillary: 160 mg/dL — ABNORMAL HIGH (ref 70–99)
Glucose-Capillary: 162 mg/dL — ABNORMAL HIGH (ref 70–99)
Glucose-Capillary: 171 mg/dL — ABNORMAL HIGH (ref 70–99)
Glucose-Capillary: 205 mg/dL — ABNORMAL HIGH (ref 70–99)

## 2024-01-20 LAB — CULTURE, BLOOD (ROUTINE X 2): Special Requests: ADEQUATE

## 2024-01-20 LAB — CBC WITH DIFFERENTIAL/PLATELET
Abs Immature Granulocytes: 0.11 K/uL — ABNORMAL HIGH (ref 0.00–0.07)
Basophils Absolute: 0.1 K/uL (ref 0.0–0.1)
Basophils Relative: 0 %
Eosinophils Absolute: 0.2 K/uL (ref 0.0–0.5)
Eosinophils Relative: 2 %
HCT: 23.6 % — ABNORMAL LOW (ref 39.0–52.0)
Hemoglobin: 7.8 g/dL — ABNORMAL LOW (ref 13.0–17.0)
Immature Granulocytes: 1 %
Lymphocytes Relative: 9 %
Lymphs Abs: 1.1 K/uL (ref 0.7–4.0)
MCH: 29.7 pg (ref 26.0–34.0)
MCHC: 33.1 g/dL (ref 30.0–36.0)
MCV: 89.7 fL (ref 80.0–100.0)
Monocytes Absolute: 1 K/uL (ref 0.1–1.0)
Monocytes Relative: 8 %
Neutro Abs: 8.9 K/uL — ABNORMAL HIGH (ref 1.7–7.7)
Neutrophils Relative %: 80 %
Platelets: 211 K/uL (ref 150–400)
RBC: 2.63 MIL/uL — ABNORMAL LOW (ref 4.22–5.81)
RDW: 16 % — ABNORMAL HIGH (ref 11.5–15.5)
WBC: 11.3 K/uL — ABNORMAL HIGH (ref 4.0–10.5)
nRBC: 0 % (ref 0.0–0.2)

## 2024-01-20 NOTE — Progress Notes (Signed)
 Jeffrey Young is an 58 y.o. 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 Admitted due to hematemesis. On admission his hgb was 6.6. He was given 1 unit of PRBC. EGD 01/15/2024   Dialysis Orders: [facility] MWF EDW 74 kg - reported that he is losing weight and has poor appetite 4 hours 3K/2.5 Ca bath 400/600 No ESA due to malignancy Hectorol 1 mcg  Assessment/Plan:  Upper GI bleed: patient had episode of hematemesis post HD on 01/14/24. Hgb dropped to 6.6 and he was given 1 unit of PRBC in ED.  Patient just hospitalized for similar episode on 8/12-8/14 and had stent placed during ERCP. EGD 01/15/2024 showed a gastric ulcer with adherent clot, reflux esophagitis but no active bleed.   ESRD:  On HD at [facility] clinic MWF.  TDC removed on 8/29 and has R AVG ready for use 01/17/24 (surgical date on 12/06/2023).   No issues at all with 2 16 ga needles cannulating the right upper arm AV graft on 8/29. Plan next treatment for Monday; no indication today.  No growth to date with drawn from HD catheter; per ID recommendations 2 weeks of IV vancomycin  with dialysis (1st dose loading 1.75g on 8/28, maintenance 750mg  on 8/29) -> final dose on 9/11.  HFpEF: does not appear to be volume overloaded Hyperbilirubinemia: elevated on presentation but showing mild improvement. CT noted increase in intrahepatic vile duct dilation compared from CT 10/13/23. Appears to be second to pancreatic mass or lymphadenopathy. Had stent replaced 01/02/24 by Dr. Rollin.   Hypertension/volume: BP fairly well-controlled today. Reported that he has been losing weight from his home unit. He has had his EDW lowered multiple times. He was recently placed on Mirtazapine . Patient appears euvolemic  on exam.   Anemia: Not on ESA due to malignancy. He has received 1 U PRBC so far with this hospitalization. Last Hgb 6.6 -> 9.2 -> 8.5. No venofer due to high ferritin level.  Metabolic bone disease: On hectorol 1 mcg. No calcitriol . Corrected Ca 9.8. P2.7 on 8/29  Nutrition:  Albumin  1.5. Starting protein supplements when he is cleared for diet.  DM: on SSI per primary.   Subjective: Easily arousable this morning, breathing comfortably.  Patient had a Tmax of 102.3 overnight on Wed, tmax over night 100.4 .  He denies any diarrhea,  abdominal pain; has intermittent cough.  Appetite actually cont to be better since evening of 8/29.    Chemistry and CBC: Creatinine  Date/Time Value Ref Range Status  01/10/2024 08:00 AM 3.13 (H) 0.61 - 1.24 mg/dL Final  92/98/7974 88:74 AM 6.41 (H) 0.61 - 1.24 mg/dL Final  93/81/7974 89:94 AM 6.79 (H) 0.61 - 1.24 mg/dL Final  94/80/7974 91:89 AM 5.73 (H) 0.61 - 1.24 mg/dL Final  95/77/7974 90:76 AM 6.66 (H) 0.61 - 1.24 mg/dL Final  95/89/7974 91:79 AM 6.11 (H) 0.61 - 1.24 mg/dL Final  97/82/7974 98:88 PM 5.59 (H) 0.61 - 1.24 mg/dL Final  98/78/7974 89:68 AM 4.48 (H) 0.61 - 1.24 mg/dL Final  98/93/7974 88:56 AM 5.42 (H) 0.61 - 1.24 mg/dL Final  93/82/7975 91:85 AM 3.53 (H) 0.61 - 1.24 mg/dL Final  95/98/7975 88:86 AM 3.11 (H) 0.61 - 1.24 mg/dL Final  97/83/7975 91:91 AM 3.17 (H) 0.61 - 1.24 mg/dL Final  87/70/7976  10:40 AM 2.94 (H) 0.61 - 1.24 mg/dL Final  88/86/7976 89:93 AM 3.01 (HH) 0.61 - 1.24 mg/dL Final    Comment:    CRITICAL RESULT CALLED TO, READ BACK BY AND VERIFIED WITH: CT SUSAN COWARD @ 1134. 04/03/2022.klj   11/07/2021 08:31 AM 2.83 (H) 0.61 - 1.24 mg/dL Final  89/89/7977 87:87 PM 1.72 (H) 0.61 - 1.24 mg/dL Final  90/72/7977 91:73 AM 1.82 (H) 0.61 - 1.24 mg/dL Final  90/87/7977 91:56 AM 1.34 (H) 0.61 - 1.24 mg/dL Final  91/70/7977 91:84 AM 1.54 (H) 0.61 - 1.24 mg/dL Final  91/84/7977 90:89 AM 1.34 (H) 0.61 - 1.24 mg/dL Final  91/98/7977  87:69 PM 1.75 (H) 0.61 - 1.24 mg/dL Final  92/81/7977 91:69 AM 1.36 (H) 0.61 - 1.24 mg/dL Final  92/94/7977 91:80 AM 1.58 (H) 0.61 - 1.24 mg/dL Final  93/79/7977 91:79 AM 1.77 (H) 0.61 - 1.24 mg/dL Final  93/86/7977 91:99 AM 1.64 (H) 0.61 - 1.24 mg/dL Final  93/93/7977 91:71 AM 1.79 (H) 0.61 - 1.24 mg/dL Final  94/76/7977 91:50 AM 2.00 (H) 0.61 - 1.24 mg/dL Final  94/90/7977 91:66 AM 2.10 (H) 0.61 - 1.24 mg/dL Final  95/74/7977 91:99 AM 2.21 (H) 0.61 - 1.24 mg/dL Final  95/88/7977 90:72 AM 2.60 (H) 0.61 - 1.24 mg/dL Final  96/71/7977 92:48 AM 2.01 (H) 0.61 - 1.24 mg/dL Final   Creatinine, Ser  Date/Time Value Ref Range Status  01/19/2024 07:10 AM 3.58 (H) 0.61 - 1.24 mg/dL Final  91/70/7974 94:46 AM 4.75 (H) 0.61 - 1.24 mg/dL Final  91/71/7974 93:76 AM 3.63 (H) 0.61 - 1.24 mg/dL Final  91/72/7974 94:67 AM 4.48 (H) 0.61 - 1.24 mg/dL Final  91/73/7974 95:96 AM 3.32 (H) 0.61 - 1.24 mg/dL Final  91/74/7974 94:59 PM 2.33 (H) 0.61 - 1.24 mg/dL Final  91/86/7974 95:50 AM 4.38 (H) 0.61 - 1.24 mg/dL Final  91/87/7974 97:48 AM 3.01 (H) 0.61 - 1.24 mg/dL Final  91/88/7974 93:65 PM 1.90 (H) 0.61 - 1.24 mg/dL Final  91/88/7974 93:93 PM 2.09 (H) 0.61 - 1.24 mg/dL Final  92/82/7974 90:44 AM 3.70 (H) 0.61 - 1.24 mg/dL Final  87/68/7975 94:99 AM 5.06 (H) 0.61 - 1.24 mg/dL Final  87/69/7975 97:43 AM 5.98 (H) 0.61 - 1.24 mg/dL Final  87/70/7975 96:92 AM 5.89 (H) 0.61 - 1.24 mg/dL Final  87/71/7975 96:59 AM 5.36 (H) 0.61 - 1.24 mg/dL Final  87/72/7975 87:60 PM 5.70 (H) 0.61 - 1.24 mg/dL Final  87/72/7975 96:80 AM 4.46 (H) 0.61 - 1.24 mg/dL Final  87/73/7975 97:43 AM 4.37 (H) 0.61 - 1.24 mg/dL Final  87/74/7975 96:83 AM 4.37 (H) 0.61 - 1.24 mg/dL Final  87/75/7975 98:87 AM 4.30 (H) 0.61 - 1.24 mg/dL Final  87/76/7975 97:82 AM 4.23 (H) 0.61 - 1.24 mg/dL Final  87/77/7975 95:83 AM 4.71 (H) 0.61 - 1.24 mg/dL Final  87/78/7975 96:95 AM 4.84 (H) 0.61 - 1.24 mg/dL Final  87/79/7975 94:92 PM 4.72 (H) 0.61 -  1.24 mg/dL Final  87/79/7975 96:81 AM 3.37 (H) 0.61 - 1.24 mg/dL Final  87/80/7975 95:93 AM 4.14 (H) 0.61 - 1.24 mg/dL Final  87/81/7975 96:80 AM 4.18 (H) 0.61 - 1.24 mg/dL Final  87/82/7975 95:89 AM 4.75 (H) 0.61 - 1.24 mg/dL Final  87/83/7975 89:76 AM 5.02 (H) 0.61 - 1.24 mg/dL Final  88/76/7977 96:54 AM 1.56 (H) 0.61 - 1.24 mg/dL Final  88/77/7977 95:74 AM 1.76 (H) 0.61 - 1.24 mg/dL Final  88/78/7977 96:99 AM 1.55 (H) 0.61 - 1.24 mg/dL Final  88/79/7977 96:46 AM 1.43 (  H) 0.61 - 1.24 mg/dL Final  88/80/7977 94:66 AM 1.39 (H) 0.61 - 1.24 mg/dL Final  88/81/7977 96:61 AM 1.68 (H) 0.61 - 1.24 mg/dL Final  88/82/7977 96:68 AM 1.57 (H) 0.61 - 1.24 mg/dL Final  88/83/7977 94:57 AM 1.52 (H) 0.61 - 1.24 mg/dL Final  88/84/7977 96:40 PM 1.73 (H) 0.61 - 1.24 mg/dL Final  88/85/7977 95:66 AM 1.80 (H) 0.61 - 1.24 mg/dL Final  88/86/7977 95:63 AM 1.92 (H) 0.61 - 1.24 mg/dL Final  88/87/7977 94:41 AM 2.09 (H) 0.61 - 1.24 mg/dL Final  88/88/7977 90:69 PM 2.18 (H) 0.61 - 1.24 mg/dL Final  95/69/7977 92:52 PM 2.20 (H) 0.61 - 1.24 mg/dL Final  96/84/7977 89:57 AM 2.13 (H) 0.61 - 1.24 mg/dL Final  96/85/7977 95:44 AM 2.30 (H) 0.61 - 1.24 mg/dL Final  96/86/7977 94:63 AM 2.55 (H) 0.61 - 1.24 mg/dL Final  96/87/7977 87:78 PM 2.53 (H) 0.61 - 1.24 mg/dL Final  96/87/7977 94:83 AM 2.41 (H) 0.61 - 1.24 mg/dL Final  96/88/7977 95:46 AM 1.81 (H) 0.61 - 1.24 mg/dL Final  96/89/7977 94:64 AM 2.12 (H) 0.61 - 1.24 mg/dL Final  96/90/7977 94:94 AM 1.85 (H) 0.61 - 1.24 mg/dL Final  96/91/7977 93:98 AM 1.65 (H) 0.61 - 1.24 mg/dL Final   Recent Labs  Lab 01/14/24 1740 01/15/24 0403 01/16/24 0532 01/17/24 0623 01/18/24 0553 01/19/24 0710  NA 132* 130* 128* 133* 131* 132*  K 3.4* 3.1* 2.9* 3.5 3.9 3.5  CL 92* 93* 92* 95* 92* 93*  CO2 29 27 28 28  21* 25  GLUCOSE 135* 145* 237* 120* 131* 184*  BUN 9 14 21* 18 29* 20  CREATININE 2.33* 3.32* 4.48* 3.63* 4.75* 3.58*  CALCIUM  7.9* 7.8* 7.8* 8.0* 8.0* 8.1*  PHOS   --   --   --   --  2.7  --    Recent Labs  Lab 01/16/24 0532 01/17/24 0623 01/18/24 0553 01/19/24 0710  WBC 13.8* 16.1* 14.6* 12.8*  NEUTROABS 11.3* 13.0* 11.6* 10.3*  HGB 8.5* 8.8* 8.4* 8.3*  HCT 24.8* 26.6* 25.1* 25.7*  MCV 87.6 89.6 89.0 90.2  PLT 165 183 191 205   Liver Function Tests: Recent Labs  Lab 01/17/24 0623 01/18/24 0553 01/19/24 0710  AST 37 34 24  ALT 29 24 20   ALKPHOS 194* 188* 198*  BILITOT 2.5* 2.0* 1.9*  PROT 5.6* 5.4* 5.5*  ALBUMIN  1.5* <1.5* 1.6*   No results for input(s): LIPASE, AMYLASE in the last 168 hours. No results for input(s): AMMONIA in the last 168 hours. Cardiac Enzymes: No results for input(s): CKTOTAL, CKMB, CKMBINDEX, TROPONINI in the last 168 hours. Iron Studies: No results for input(s): IRON, TIBC, TRANSFERRIN, FERRITIN in the last 72 hours. PT/INR: @LABRCNTIP (inr:5)  Xrays/Other Studies: ) Results for orders placed or performed during the hospital encounter of 01/14/24 (from the past 48 hours)  Glucose, capillary     Status: Abnormal   Collection Time: 01/18/24  7:51 AM  Result Value Ref Range   Glucose-Capillary 136 (H) 70 - 99 mg/dL    Comment: Glucose reference range applies only to samples taken after fasting for at least 8 hours.  Glucose, capillary     Status: Abnormal   Collection Time: 01/18/24  3:33 PM  Result Value Ref Range   Glucose-Capillary 134 (H) 70 - 99 mg/dL    Comment: Glucose reference range applies only to samples taken after fasting for at least 8 hours.  Glucose, capillary     Status: Abnormal   Collection Time:  01/18/24  9:37 PM  Result Value Ref Range   Glucose-Capillary 180 (H) 70 - 99 mg/dL    Comment: Glucose reference range applies only to samples taken after fasting for at least 8 hours.  CBC with Differential/Platelet     Status: Abnormal   Collection Time: 01/19/24  7:10 AM  Result Value Ref Range   WBC 12.8 (H) 4.0 - 10.5 K/uL   RBC 2.85 (L) 4.22 - 5.81 MIL/uL    Hemoglobin 8.3 (L) 13.0 - 17.0 g/dL   HCT 74.2 (L) 60.9 - 47.9 %   MCV 90.2 80.0 - 100.0 fL   MCH 29.1 26.0 - 34.0 pg   MCHC 32.3 30.0 - 36.0 g/dL   RDW 83.8 (H) 88.4 - 84.4 %   Platelets 205 150 - 400 K/uL   nRBC 0.0 0.0 - 0.2 %   Neutrophils Relative % 80 %   Neutro Abs 10.3 (H) 1.7 - 7.7 K/uL   Lymphocytes Relative 7 %   Lymphs Abs 1.0 0.7 - 4.0 K/uL   Monocytes Relative 10 %   Monocytes Absolute 1.2 (H) 0.1 - 1.0 K/uL   Eosinophils Relative 1 %   Eosinophils Absolute 0.2 0.0 - 0.5 K/uL   Basophils Relative 1 %   Basophils Absolute 0.1 0.0 - 0.1 K/uL   Immature Granulocytes 1 %   Abs Immature Granulocytes 0.13 (H) 0.00 - 0.07 K/uL    Comment: Performed at [facility], [address] [place], [place] [address]  Comprehensive metabolic panel with GFR     Status: Abnormal   Collection Time: 01/19/24  7:10 AM  Result Value Ref Range   Sodium 132 (L) 135 - 145 mmol/L   Potassium 3.5 3.5 - 5.1 mmol/L   Chloride 93 (L) 98 - 111 mmol/L   CO2 25 22 - 32 mmol/L   Glucose, Bld 184 (H) 70 - 99 mg/dL    Comment: Glucose reference range applies only to samples taken after fasting for at least 8 hours.   BUN 20 6 - 20 mg/dL   Creatinine, Ser 6.41 (H) 0.61 - 1.24 mg/dL   Calcium  8.1 (L) 8.9 - 10.3 mg/dL   Total Protein 5.5 (L) 6.5 - 8.1 g/dL   Albumin  1.6 (L) 3.5 - 5.0 g/dL   AST 24 15 - 41 U/L   ALT 20 0 - 44 U/L   Alkaline Phosphatase 198 (H) 38 - 126 U/L   Total Bilirubin 1.9 (H) 0.0 - 1.2 mg/dL   GFR, Estimated 19 (L) >60 mL/min    Comment: (NOTE) Calculated using the CKD-EPI Creatinine Equation (2021)    Anion gap 14 5 - 15    Comment: Performed at [facility], [address] [place], [place] [address]  Glucose, capillary     Status: Abnormal   Collection Time: 01/19/24  8:39 AM  Result Value Ref Range   Glucose-Capillary 168 (H) 70 - 99 mg/dL    Comment: Glucose reference range applies only to samples taken after fasting for at least 8 hours.  Glucose,  capillary     Status: Abnormal   Collection Time: 01/19/24 12:16 PM  Result Value Ref Range   Glucose-Capillary 179 (H) 70 - 99 mg/dL    Comment: Glucose reference range applies only to samples taken after fasting for at least 8 hours.  Glucose, capillary     Status: Abnormal   Collection Time: 01/19/24  4:23 PM  Result Value Ref Range   Glucose-Capillary 191 (  H) 70 - 99 mg/dL    Comment: Glucose reference range applies only to samples taken after fasting for at least 8 hours.  Glucose, capillary     Status: Abnormal   Collection Time: 01/19/24 11:59 PM  Result Value Ref Range   Glucose-Capillary 205 (H) 70 - 99 mg/dL    Comment: Glucose reference range applies only to samples taken after fasting for at least 8 hours.   IR Removal Tun Cv Cath W/O FL Result Date: 01/18/2024 INDICATION: History of ESRD on hemodialysis. New right AV graft placement with successful use. No longer needing tunneled dialysis catheter. Radiology consulted for tunneled dialysis catheter removal. EXAM: REMOVAL TUNNELED HEMODIALYSIS CATHETER MEDICATIONS: 5 mL 1% lidocaine  ANESTHESIA/SEDATION: None FLUOROSCOPY: None COMPLICATIONS: None immediate. PROCEDURE: Informed written consent was obtained from the patient after a thorough discussion of the procedural risks, benefits and alternatives. All questions were addressed. Maximal Sterile Barrier Technique was utilized including caps, mask, sterile gowns, sterile gloves, sterile drape, hand hygiene and skin antiseptic. A timeout was performed prior to the initiation of the procedure. The patient's right chest and catheter was prepped and draped in a normal sterile fashion. Heparin  was removed from both ports of catheter. 1% lidocaine  was used for local anesthesia. Using gentle blunt dissection the cuff of the catheter was exposed and the catheter was removed in it's entirety. Pressure was held till hemostasis was obtained. A sterile dressing was applied. The patient tolerated the  procedure well with no immediate complications. IMPRESSION: Successful catheter removal as described above. Performed by: Wyatt Pommier, PA-C Electronically Signed   By: Wilkie Lent M.D.   On: 01/18/2024 17:25    PMH:   Past Medical History:  Diagnosis Date   Arthritis    Hip   CHF (congestive heart failure) (HCC)    Colon cancer (HCC)    Diabetic mononeuropathy associated with type 2 diabetes mellitus (HCC) 03/21/2017   Diabetic retinopathy (HCC) 07/31/2020   Enthesopathy of ankle and tarsus 04/02/2009   Formatting of this note might be different from the original. 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 Type 2 Diabetes Mellitus - Uncomplicated, Uncontrolled    PSH:   Past Surgical History:  Procedure Laterality Date   AMPUTATION Left 01/22/2018   Procedure: TRANSMETATARSAL AMPUTATION;  Surgeon: Harden Jerona GAILS, MD;  Location: [facility] OR;  Service: Orthopedics;  Laterality: Left;toes   BILIARY STENT PLACEMENT N/A 05/10/2023   Procedure: BILIARY STENT PLACEMENT;  Surgeon: Rollin Dover, MD;  Location: [facility] ENDOSCOPY;  Service: Gastroenterology;  Laterality: N/A;   BILIARY STENT PLACEMENT N/A 05/18/2023   Procedure: BILIARY STENT PLACEMENT;  Surgeon: Rollin Dover, MD;  Location: [facility]  ENDOSCOPY;  Service: Gastroenterology;  Laterality: N/A;   BILIARY STENT PLACEMENT  01/02/2024   Procedure: INSERTION, STENT, BILE DUCT;  Surgeon: Rollin Dover, MD;  Location: [facility] ENDOSCOPY;  Service: Gastroenterology;;   BIOPSY  07/27/2020   Procedure: BIOPSY;  Surgeon: Rollin Dover, MD;  Location: [facility] ENDOSCOPY;  Service: Endoscopy;;   COLON RESECTION N/A 07/30/2020   Procedure: HAND ASSISTED LAPAROSCOPIC LEFT HEMI COLECTOMY;  Surgeon: Gladis Cough, MD;  Location: [facility] ORS;  Service: General;  Laterality: N/A;   COLONOSCOPY WITH PROPOFOL  N/A 05/24/2018   Procedure: COLONOSCOPY WITH PROPOFOL ;  Surgeon: Rollin Dover, MD;  Location: [facility];  Service: Endoscopy;  Laterality: N/A;   COLONOSCOPY WITH PROPOFOL  N/A 07/27/2020   Procedure: COLONOSCOPY WITH PROPOFOL ;  Surgeon: Rollin Dover, MD;  Location: [facility];  Service: Endoscopy;  Laterality: N/A;   DIALYSIS/PERMA CATHETER INSERTION N/A 11/19/2023   Procedure: DIALYSIS/PERMA CATHETER INSERTION;  Surgeon: [name] [name], MD;  Location: [facility];  Service: Cardiovascular;  Laterality: N/A;   ERCP N/A 05/10/2023   Procedure: ENDOSCOPIC RETROGRADE CHOLANGIOPANCREATOGRAPHY (ERCP);  Surgeon: Rollin Dover, MD;  Location: [facility];  Service: Gastroenterology;  Laterality: N/A;   ERCP N/A 05/18/2023   Procedure: ENDOSCOPIC RETROGRADE CHOLANGIOPANCREATOGRAPHY (ERCP);  Surgeon: Rollin Dover, MD;  Location: [facility];  Service: Gastroenterology;  Laterality: N/A;   ERCP N/A 01/02/2024   Procedure: ERCP, WITH INTERVENTION IF INDICATED;  Surgeon: Rollin Dover, MD;  Location: [facility] ENDOSCOPY;  Service: Gastroenterology;  Laterality: N/A;   ESOPHAGOGASTRODUODENOSCOPY Left 04/05/2021   Procedure: ESOPHAGOGASTRODUODENOSCOPY (EGD);  Surgeon: Rollin Dover, MD;  Location: [facility];  Service: Endoscopy;  Laterality: Left;   ESOPHAGOGASTRODUODENOSCOPY N/A 05/10/2023   Procedure: ESOPHAGOGASTRODUODENOSCOPY (EGD);  Surgeon: Rollin Dover, MD;   Location: [facility];  Service: Gastroenterology;  Laterality: N/A;   ESOPHAGOGASTRODUODENOSCOPY N/A 01/15/2024   Procedure: EGD (ESOPHAGOGASTRODUODENOSCOPY);  Surgeon: Rollin Dover, MD;  Location: [facility] ENDOSCOPY;  Service: Gastroenterology;  Laterality: N/A;   EUS N/A 05/10/2023   Procedure: UPPER ENDOSCOPIC ULTRASOUND (EUS) LINEAR;  Surgeon: Rollin Dover, MD;  Location: [facility];  Service: Gastroenterology;  Laterality: N/A;   FINE NEEDLE ASPIRATION N/A 05/10/2023   Procedure: FINE NEEDLE ASPIRATION (FNA) LINEAR;  Surgeon: Rollin Dover, MD;  Location: [facility];  Service: Gastroenterology;  Laterality: N/A;   INSERTION OF ARTERIOVENOUS (AV) ARTEGRAFT ARM Right 12/06/2023   Procedure: INSERTION, GRAFT, ARTERIOVENOUS, UPPER EXTREMITY;  Surgeon: [name] [name], MD;  Location: [facility] OR;  Service: Vascular;  Laterality: Right;   INSERTION OF DIALYSIS CATHETER Right 12/06/2023   Procedure: EXCHANGE OF DIALYSIS CATHETER USING PALINDROME 23CM CATHETER KIT;  Surgeon: [name] [name], MD;  Location: [facility] OR;  Service: Vascular;  Laterality: Right;   IR REMOVAL TUN CV CATH W/O FL  01/18/2024   POLYPECTOMY  05/24/2018   Procedure: POLYPECTOMY;  Surgeon: Rollin Dover, MD;  Location: [facility];  Service: Endoscopy;;   PORTACATH PLACEMENT Left 08/24/2020   Procedure: INSERTION PORT-A-CATH;  Surgeon: Gladis Cough, MD;  Location: [facility]S;  Service: General;  Laterality: Left;  75/rm1   SPHINCTEROTOMY  05/10/2023   Procedure: ANNETT;  Surgeon: Rollin Dover, MD;  Location: [facility];  Service: Gastroenterology;;   ANNETT  05/18/2023   Procedure: ANNETT;  Surgeon: Rollin Dover, MD;  Location: [facility];  Service: Gastroenterology;;   CLEDA REMOVAL  05/18/2023   Procedure: STENT REMOVAL;  Surgeon: Rollin Dover, MD;  Location: [facility];  Service: Gastroenterology;;   CLEDA REMOVAL  01/02/2024   Procedure: STENT REMOVAL;  Surgeon: Rollin Dover, MD;  Location: [facility]  ENDOSCOPY;  Service: Gastroenterology;;   SUBMUCOSAL TATTOO INJECTION  07/27/2020   Procedure: SUBMUCOSAL TATTOO INJECTION;  Surgeon: Rollin Dover, MD;  Location: [facility];  Service: Endoscopy;;    Allergies:  Allergies  Allergen Reactions   Bee Venom Anaphylaxis, Swelling and Other (See Comments)    Cold Sweats, also   Latex Rash and Dermatitis    Medications:   Prior to Admission medications   Medication Sig Start Date End Date Taking? Authorizing Provider  amLODipine  (NORVASC ) 10 MG tablet Take 10 mg by mouth daily.   Yes [provider]  baclofen  (LIORESAL ) 10 MG tablet Take 10 mg by mouth daily as needed for muscle spasms.   Yes [provider]  cadexomer iodine  (IODOSORB) 0.9 % gel Apply 1 Application topically daily as needed for wound care.   Yes [provider]  carvedilol  (COREG ) 3.125 MG tablet Take 1 tablet (3.125 mg total) by mouth 2 (two) times daily with a meal. 01/03/24  Yes Dennise Lavada POUR, MD  EPINEPHrine  0.3 mg/0.3 mL IJ SOAJ injection Inject 0.3 mg into the muscle as needed for anaphylaxis.   Yes [provider]  ergocalciferol (VITAMIN D2) 1.25 MG (50000 UT) capsule Take 50,000 Units by mouth once a week. Take on Monday   Yes [provider]  ferrous sulfate 325 (65 FE) MG EC tablet Take 325 mg by mouth daily with breakfast.   Yes [provider]  furosemide  (LASIX ) 20 MG tablet Take 20 mg by mouth.   Yes [provider]  LANTUS  SOLOSTAR 100 UNIT/ML Solostar Pen Inject 10-15 Units into the skin See admin instructions. Inject 15 units into the skin in the morning and 10 units into the skin at bedtime   Yes [provider]  loperamide (IMODIUM) 2 MG capsule Take 2 mg by mouth daily as needed for diarrhea or loose stools.   Yes [provider]  losartan (COZAAR) 50 MG tablet Take 50 mg by mouth daily. 01/11/24  Yes [provider]  mirtazapine  (REMERON ) 15 MG tablet Take 7.5 mg by  mouth at bedtime. 12/26/23  Yes [provider]  Nutritional Supplements (FEEDING SUPPLEMENT, NEPRO CARB STEADY,) LIQD Take 237 mLs by mouth daily. Mixed berry   Yes [provider]  Sodium Chloride , GU Irrigant, (0.9 % SODIUM CHLORIDE , POUR BTL, OPTIME) Irrigate with 1,000 mLs as directed daily.   Yes [provider]    Discontinued Meds:   Medications Discontinued During This Encounter  Medication Reason   ACCU-CHEK GUIDE test strip    Continuous Glucose Sensor (DEXCOM G6 SENSOR) MISC    Continuous Glucose Transmitter (DEXCOM G6 TRANSMITTER) MISC    oxyCODONE  (ROXICODONE ) 5 MG immediate release tablet Completed Course   pantoprazole  (PROTONIX ) 40 MG tablet Completed Course   prochlorperazine  (COMPAZINE ) 10 MG tablet Change in therapy   calcitRIOL  (ROCALTROL ) 0.25 MCG capsule Change in therapy   0.9 %  sodium chloride  infusion Patient Transfer   insulin  aspart (novoLOG ) injection 0-6 Units    pentafluoroprop-tetrafluoroeth (GEBAUERS) aerosol 1 Application Patient Transfer   lidocaine  (PF) (XYLOCAINE ) 1 % injection 5 mL Patient Transfer   lidocaine -prilocaine  (EMLA ) cream 1 Application Patient Transfer   heparin  injection 1,000 Units Patient Transfer   anticoagulant sodium citrate  solution 5 mL Patient Transfer   alteplase  (CATHFLO ACTIVASE ) injection 2 mg Patient Transfer   ampicillin -sulbactam (UNASYN ) 1.5 g in sodium chloride  0.9 % 100 mL IVPB    vancomycin  (VANCOREADY) IVPB 750 mg/150 mL    cefTRIAXone  (ROCEPHIN ) 2 g in sodium chloride  0.9 % 100 mL IVPB    metroNIDAZOLE  (FLAGYL ) tablet 500 mg     Social History:  reports that  he has never smoked. He has never used smokeless tobacco. He reports current alcohol use of about 1.0 standard drink of alcohol per week. He reports that he does not use drugs.  Family History:   Family History  Problem Relation Age of Onset   Hypertension Father     Blood pressure 117/67, pulse 67, temperature 99.5 F (37.5 C),  temperature source Oral, resp. rate 10, height 6' 1 (1.854 m), weight 68.7 kg, SpO2 95%. Physical Exam: GENERAL:  Lethargic but easily arousable, pleasant, NAD  HEENT:  EOMI CARDIOVASCULAR:  RRR, no murmurs appreciated RESPIRATORY:  Clear to auscultation GASTROINTESTINAL:  Soft, nontender, nondistended EXTREMITIES:  No LE edema bilaterally NEURO:  No new focal deficits appreciated SKIN:  No rashes noted PSYCH:  Appropriate mood and affect Dialysis access: TDC removed on 8/29, right upper arm AV graft with a very nice thrill ready to be used on 01/17/2024 (placed 12/06/2023) -> 2 needles on 8/29     MELIA LYNWOOD ORN, MD 01/20/2024, 7:30 AM

## 2024-01-20 NOTE — Progress Notes (Signed)
 PROGRESS NOTE                                                                                                                                                                                                             Patient Demographics:    Jeffrey Young, is a 58 y.o. male, DOB - September 02, 1965, FMW:[id number]  Outpatient Primary MD for the patient is [facility]    LOS - 4  Admit date - 01/14/2024    Chief Complaint  Patient presents with   Abnormal Lab       Brief Narrative (HPI from H&P)   58 year old male with history of ESRD (MWF), colon cancer s/p partial colectomy, HFpEF, DM, transmetatarsal amputation left foot, anemia (baseline 7-9), presenting with acute upper GI bleed and worsening anemia.    Subjective:   Patient in bed, appears comfortable, denies any headache, no fever, no chest pain or pressure, no shortness of breath , no abdominal pain. No new focal weakness.   Assessment  & Plan :   Acute blood loss anemia, hematemesis- Acute UGI Bleed, history of stage III colon cancer with metastasis, rebleed, had similar episode few weeks ago.  Has been seen by GI, s/p 1 unit of packed RBC transfusion, underwent EGD on 01/15/2024 showing a gastric ulcer which was nonbleeding but did have adherent clot, continue to monitor CBC, continue PPI.  Have also informed patient's oncology about his admission, will get palliative care for goals of care discussions.   Fever on running for the last 24 hours.  Blood cultures drawn morning of 01/16/2024, likely aspirated when he threw up some blood however he also has biliary stent and could have a GI source also is a ESRD patient on HD, was placed on empiric antibiotics, had another hide temperature the night of 01/16/2024, 1 out of 2 cultures growing staph epi which is likely contamination, for now remains on broad-spectrum antibiotics, will check with ID, repeating blood  cultures on 01/17/2024 per ID request they were drawn from the HD catheter in the right IJ by nephrology.  ID now managing antibiotics.  For now per ID 2 weeks of total IV vancomycin .  Monitor.   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 Cloretta. -Continue mirtazapine    Hypertension Blood pressure normal off medications - Hold amlodipine , carvedilol , furosemide , losartan until hemodynamics clear   Hypokalemia and hyponatremia - Treat with dialysis, nephrology on board  Diabetes 2 Glucose controlled - Sliding scale corrections   Lab Results  Component Value Date   HGBA1C 5.4 12/31/2023   CBG (last 3)  Recent Labs    01/19/24 1623 01/19/24 2359 01/20/24 0800  GLUCAP 191* 205* 171*           Condition - Extremely Guarded  Family Communication  :  None  Code Status :  Full  Consults  :  GI, nephrology, Pall care, ID oncology  PUD Prophylaxis : PPI   Procedures  :     EGD  Impression:               - LA Grade D reflux esophagitis with no bleeding.                           - 2 cm hiatal hernia.                           - Non-bleeding gastric ulcer with an adherent clot                            (Forrest Class IIb). Biopsied. Injected.                           - Normal examined duodenum. Recommendation:           - Return patient to hospital ward for ongoing care.                           - Resume regular diet in 2 hours.                           - Continue present medications.                           - Await pathology results.                           - Monitor HGB and transfuse as necessary.                           - PPI QD indefinitely.      Disposition Plan  :    Status is: Inpatient   DVT Prophylaxis  :    SCDs Start: 01/14/24 2012    Lab Results  Component Value Date   PLT 205 01/19/2024     Diet :  Diet Order             Diet regular Fluid consistency: Thin  Diet effective now                    Inpatient Medications  Scheduled Meds:  sodium chloride    Intravenous Once   calcitRIOL   0.25 mcg Oral Daily   Chlorhexidine  Gluconate Cloth  6 each Topical Q0600   insulin  aspart  0-6 Units Subcutaneous TID WC   mirtazapine   7.5 mg Oral Daily  pantoprazole  (PROTONIX ) IV  40 mg Intravenous Q12H   sodium chloride  flush  3 mL Intravenous Q12H   Continuous Infusions:  [START ON 01/21/2024] vancomycin      PRN Meds:.acetaminophen  **OR** acetaminophen , chlorproMAZINE , fentaNYL  (SUBLIMAZE ) injection, oxyCODONE , prochlorperazine     Objective:   Vitals:   01/19/24 2000 01/20/24 0000 01/20/24 0400 01/20/24 0800  BP: 130/65 (!) 131/53 117/67 (!) 150/70  Pulse: 85 77 67 75  Resp: 19 17 10  (!) 22  Temp: (!) 100.4 F (38 C) 99.4 F (37.4 C) 99.5 F (37.5 C) 98.9 F (37.2 C)  TempSrc: Axillary Axillary Oral Oral  SpO2: 94% 94% 95% 93%  Weight:      Height:        Wt Readings from Last 3 Encounters:  01/18/24 68.7 kg  01/10/24 73.9 kg  01/02/24 70.6 kg     Intake/Output Summary (Last 24 hours) at 01/20/2024 0825 Last data filed at 01/19/2024 2120 Gross per 24 hour  Intake 3 ml  Output --  Net 3 ml     Physical Exam  Awake Alert, No new F.N deficits, Normal affect [place].AT,PERRAL Supple Neck, No JVD,   Symmetrical Chest wall movement, Good air movement bilaterally, CTAB RRR,No Gallops,Rubs or new Murmurs,  +ve B.Sounds, Abd Soft, No tenderness, right arm fistula, right IJ dialysis catheter, left subclavian Port-A-Cath No edema       Data Review:    Recent Labs  Lab 01/15/24 1306 01/16/24 0532 01/17/24 0623 01/18/24 0553 01/19/24 0710  WBC 15.9* 13.8* 16.1* 14.6* 12.8*  HGB 9.2*  9.3* 8.5* 8.8* 8.4* 8.3*  HCT 27.4*  27.8* 24.8* 26.6* 25.1* 25.7*  PLT 190 165 183 191 205  MCV 89.3 87.6 89.6 89.0 90.2  MCH 30.0 30.0 29.6 29.8 29.1  MCHC  33.6 34.3 33.1 33.5 32.3  RDW 16.5* 16.0* 16.1* 16.2* 16.1*  LYMPHSABS  --  0.8 1.2 1.3 1.0  MONOABS  --  1.5* 1.6* 1.4* 1.2*  EOSABS  --  0.0 0.1 0.1 0.2  BASOSABS  --  0.0 0.1 0.1 0.1    Recent Labs  Lab 01/15/24 0403 01/16/24 0532 01/17/24 0623 01/17/24 1444 01/18/24 0553 01/19/24 0710  NA 130* 128* 133*  --  131* 132*  K 3.1* 2.9* 3.5  --  3.9 3.5  CL 93* 92* 95*  --  92* 93*  CO2 27 28 28   --  21* 25  ANIONGAP 10 8 10   --  18* 14  GLUCOSE 145* 237* 120*  --  131* 184*  BUN 14 21* 18  --  29* 20  CREATININE 3.32* 4.48* 3.63*  --  4.75* 3.58*  AST 40 39 37  --  34 24  ALT 30 30 29   --  24 20  ALKPHOS 200* 211* 194*  --  188* 198*  BILITOT 2.2* 2.1* 2.5*  --  2.0* 1.9*  ALBUMIN  1.5* 1.6* 1.5*  --  <1.5* 1.6*  CRP  --   --   --  23.3*  --   --   PROCALCITON  --   --  16.73  --   --   --   PHOS  --   --   --   --  2.7  --   CALCIUM  7.8* 7.8* 8.0*  --  8.0* 8.1*      Recent Labs  Lab 01/15/24 0403 01/16/24 0532 01/17/24 0623 01/17/24 1444 01/18/24 0553 01/19/24 0710  CRP  --   --   --  23.3*  --   --  PROCALCITON  --   --  16.73  --   --   --   CALCIUM  7.8* 7.8* 8.0*  --  8.0* 8.1*    --------------------------------------------------------------------------------------------------------------- Lab Results  Component Value Date   CHOL 152 07/27/2020   HDL 36 (L) 07/27/2020   LDLCALC 94 07/27/2020   TRIG 109 07/27/2020   CHOLHDL 4.2 07/27/2020    Lab Results  Component Value Date   HGBA1C 5.4 12/31/2023   Micro Results Recent Results (from the past 240 hours)  Culture, blood (Routine X 2) w Reflex to ID Panel     Status: Abnormal   Collection Time: 01/16/24  7:42 AM   Specimen: BLOOD LEFT ARM  Result Value Ref Range Status   Specimen Description BLOOD LEFT ARM  Final   Special Requests   Final    BOTTLES DRAWN AEROBIC AND ANAEROBIC Blood Culture adequate volume   Culture  Setup Time   Final    GRAM POSITIVE COCCI IN CLUSTERS IN BOTH AEROBIC AND  ANAEROBIC BOTTLES CRITICAL RESULT CALLED TO, READ BACK BY AND VERIFIED WITH: PHARMD JAMES W  [id number] FCP    Culture (A)  Final    STAPHYLOCOCCUS EPIDERMIDIS STAPHYLOCOCCUS HOMINIS THE SIGNIFICANCE OF ISOLATING THIS ORGANISM FROM A SINGLE SET OF BLOOD CULTURES WHEN MULTIPLE SETS ARE DRAWN IS UNCERTAIN. PLEASE NOTIFY THE MICROBIOLOGY DEPARTMENT WITHIN ONE WEEK IF SPECIATION AND SENSITIVITIES ARE REQUIRED. Performed at [facility], [address] [place], [place] [address]    Report Status 01/18/2024 FINAL  Final  Blood Culture ID Panel (Reflexed)     Status: Abnormal   Collection Time: 01/16/24  7:42 AM  Result Value Ref Range Status   Enterococcus faecalis NOT DETECTED NOT DETECTED Final   Enterococcus Faecium NOT DETECTED NOT DETECTED Final   Listeria monocytogenes NOT DETECTED NOT DETECTED Final   Staphylococcus species DETECTED (A) NOT DETECTED Final    Comment: CRITICAL RESULT CALLED TO, READ BACK BY AND VERIFIED WITH: MAYA LYNWOOD ORN  [id number] FCP    Staphylococcus aureus (BCID) NOT DETECTED NOT DETECTED Final   Staphylococcus epidermidis DETECTED (A) NOT DETECTED Final    Comment: Methicillin (oxacillin) resistant coagulase negative staphylococcus. Possible blood culture contaminant (unless isolated from more than one blood culture draw or clinical case suggests pathogenicity). No antibiotic treatment is indicated for blood  culture contaminants. CRITICAL RESULT CALLED TO, READ BACK BY AND VERIFIED WITH: MAYA LYNWOOD ORN  [id number] FCP    Staphylococcus lugdunensis NOT DETECTED NOT DETECTED Final   Streptococcus species NOT DETECTED NOT DETECTED Final   Streptococcus agalactiae NOT DETECTED NOT DETECTED Final   Streptococcus pneumoniae NOT DETECTED NOT DETECTED Final   Streptococcus pyogenes NOT DETECTED NOT DETECTED Final   A.calcoaceticus-baumannii NOT DETECTED NOT DETECTED Final   Bacteroides fragilis NOT DETECTED NOT DETECTED Final   Enterobacterales NOT  DETECTED NOT DETECTED Final   Enterobacter cloacae complex NOT DETECTED NOT DETECTED Final   Escherichia coli NOT DETECTED NOT DETECTED Final   Klebsiella aerogenes NOT DETECTED NOT DETECTED Final   Klebsiella oxytoca NOT DETECTED NOT DETECTED Final   Klebsiella pneumoniae NOT DETECTED NOT DETECTED Final   Proteus species NOT DETECTED NOT DETECTED Final   Salmonella species NOT DETECTED NOT DETECTED Final   Serratia marcescens NOT DETECTED NOT DETECTED Final   Haemophilus influenzae NOT DETECTED NOT DETECTED Final   Neisseria meningitidis NOT DETECTED NOT DETECTED Final   Pseudomonas aeruginosa NOT DETECTED NOT DETECTED Final   Stenotrophomonas maltophilia NOT DETECTED  NOT DETECTED Final   Candida albicans NOT DETECTED NOT DETECTED Final   Candida auris NOT DETECTED NOT DETECTED Final   Candida glabrata NOT DETECTED NOT DETECTED Final   Candida krusei NOT DETECTED NOT DETECTED Final   Candida parapsilosis NOT DETECTED NOT DETECTED Final   Candida tropicalis NOT DETECTED NOT DETECTED Final   Cryptococcus neoformans/gattii NOT DETECTED NOT DETECTED Final   Methicillin resistance mecA/C DETECTED (A) NOT DETECTED Final    Comment: CRITICAL RESULT CALLED TO, READ BACK BY AND VERIFIED WITH: MAYA LYNWOOD ORN  [id number] FCP Performed at [facility], [address], [place], [place] [address]   Culture, blood (Routine X 2) w Reflex to ID Panel     Status: Abnormal (Preliminary result)   Collection Time: 01/16/24  2:54 PM   Specimen: BLOOD  Result Value Ref Range Status   Specimen Description BLOOD SITE NOT SPECIFIED  Final   Special Requests   Final    BOTTLES DRAWN AEROBIC AND ANAEROBIC Blood Culture adequate volume   Culture  Setup Time   Final    GRAM POSITIVE COCCI IN CHAINS AEROBIC BOTTLE ONLY CRITICAL RESULT CALLED TO, READ BACK BY AND VERIFIED WITH: MAYA LYNWOOD ORN [id number] FCP    Culture (A)  Final    STREPTOCOCCUS CONSTELLATUS SUSCEPTIBILITIES TO FOLLOW Performed at  [facility], [address], [place], [place] [address]    Report Status PENDING  Incomplete  Blood Culture ID Panel (Reflexed)     Status: Abnormal   Collection Time: 01/16/24  2:54 PM  Result Value Ref Range Status   Enterococcus faecalis NOT DETECTED NOT DETECTED Final   Enterococcus Faecium NOT DETECTED NOT DETECTED Final   Listeria monocytogenes NOT DETECTED NOT DETECTED Final   Staphylococcus species NOT DETECTED NOT DETECTED Final   Staphylococcus aureus (BCID) NOT DETECTED NOT DETECTED Final   Staphylococcus epidermidis NOT DETECTED NOT DETECTED Final   Staphylococcus lugdunensis NOT DETECTED NOT DETECTED Final   Streptococcus species DETECTED (A) NOT DETECTED Final    Comment: Not Enterococcus species, Streptococcus agalactiae, Streptococcus pyogenes, or Streptococcus pneumoniae. CRITICAL RESULT CALLED TO, READ BACK BY AND VERIFIED WITH: MAYA LYNWOOD ORN [id number] FCP    Streptococcus agalactiae NOT DETECTED NOT DETECTED Final   Streptococcus pneumoniae NOT DETECTED NOT DETECTED Final   Streptococcus pyogenes NOT DETECTED NOT DETECTED Final   A.calcoaceticus-baumannii NOT DETECTED NOT DETECTED Final   Bacteroides fragilis NOT DETECTED NOT DETECTED Final   Enterobacterales NOT DETECTED NOT DETECTED Final   Enterobacter cloacae complex NOT DETECTED NOT DETECTED Final   Escherichia coli NOT DETECTED NOT DETECTED Final   Klebsiella aerogenes NOT DETECTED NOT DETECTED Final   Klebsiella oxytoca NOT DETECTED NOT DETECTED Final   Klebsiella pneumoniae NOT DETECTED NOT DETECTED Final   Proteus species NOT DETECTED NOT DETECTED Final   Salmonella species NOT DETECTED NOT DETECTED Final   Serratia marcescens NOT DETECTED NOT DETECTED Final   Haemophilus influenzae NOT DETECTED NOT DETECTED Final   Neisseria meningitidis NOT DETECTED NOT DETECTED Final   Pseudomonas aeruginosa NOT DETECTED NOT DETECTED Final   Stenotrophomonas maltophilia NOT DETECTED NOT DETECTED Final    Candida albicans NOT DETECTED NOT DETECTED Final   Candida auris NOT DETECTED NOT DETECTED Final   Candida glabrata NOT DETECTED NOT DETECTED Final   Candida krusei NOT DETECTED NOT DETECTED Final   Candida parapsilosis NOT DETECTED NOT DETECTED Final   Candida tropicalis NOT DETECTED NOT DETECTED Final   Cryptococcus neoformans/gattii  NOT DETECTED NOT DETECTED Final    Comment: Performed at [facility], [address], [place], [place] [address]  Culture, blood (Routine X 2) w Reflex to ID Panel     Status: None (Preliminary result)   Collection Time: 01/17/24 10:45 AM   Specimen: BLOOD  Result Value Ref Range Status   Specimen Description BLOOD SITE NOT SPECIFIED  Final   Special Requests   Final    BOTTLES DRAWN AEROBIC AND ANAEROBIC Blood Culture adequate volume   Culture   Final    NO GROWTH 3 DAYS Performed at [facility], [address], [place], [place] [address]    Report Status PENDING  Incomplete  Culture, blood (Routine X 2) w Reflex to ID Panel     Status: None (Preliminary result)   Collection Time: 01/17/24 10:45 AM   Specimen: BLOOD  Result Value Ref Range Status   Specimen Description BLOOD SITE NOT SPECIFIED  Final   Special Requests   Final    BOTTLES DRAWN AEROBIC AND ANAEROBIC Blood Culture adequate volume   Culture   Final    NO GROWTH 3 DAYS Performed at [facility], [address], [place], [place] [address]    Report Status PENDING  Incomplete    Radiology Report IR Removal Tun Cv Cath W/O FL Result Date: 01/18/2024 INDICATION: History of ESRD on hemodialysis. New right AV graft placement with successful use. No longer needing tunneled dialysis catheter. Radiology consulted for tunneled dialysis catheter removal. EXAM: REMOVAL TUNNELED HEMODIALYSIS CATHETER MEDICATIONS: 5 mL 1% lidocaine  ANESTHESIA/SEDATION: None FLUOROSCOPY: None COMPLICATIONS: None immediate. PROCEDURE: Informed written consent was obtained from the patient after a  thorough discussion of the procedural risks, benefits and alternatives. All questions were addressed. Maximal Sterile Barrier Technique was utilized including caps, mask, sterile gowns, sterile gloves, sterile drape, hand hygiene and skin antiseptic. A timeout was performed prior to the initiation of the procedure. The patient's right chest and catheter was prepped and draped in a normal sterile fashion. Heparin  was removed from both ports of catheter. 1% lidocaine  was used for local anesthesia. Using gentle blunt dissection the cuff of the catheter was exposed and the catheter was removed in it's entirety. Pressure was held till hemostasis was obtained. A sterile dressing was applied. The patient tolerated the procedure well with no immediate complications. IMPRESSION: Successful catheter removal as described above. Performed by: Wyatt Pommier, PA-C Electronically Signed   By: Wilkie Lent M.D.   On: 01/18/2024 17:25     Signature  -   Lavada Stank M.D on 01/20/2024 at 8:25 AM   -  To page go to [email or web address]

## 2024-01-20 NOTE — Progress Notes (Signed)
 Daily Progress Note   Patient Name: Jeffrey Young       Date: 01/20/2024 DOB: 10-Jul-1965  Age: 58 y.o. MRN#: [id number] Attending Physician: Jeffrey Lavada POUR, MD Primary Care Physician: [facility] Admit Date: 01/14/2024  Reason for Consultation/Follow-up: Establishing goals of care  Length of Stay: 4  Current Medications: Scheduled Meds:   sodium chloride    Intravenous Once   calcitRIOL   0.25 mcg Oral Daily   Chlorhexidine  Gluconate Cloth  6 each Topical Q0600   insulin  aspart  0-6 Units Subcutaneous TID WC   mirtazapine   7.5 mg Oral Daily   pantoprazole  (PROTONIX ) IV  40 mg Intravenous Q12H   sodium chloride  flush  3 mL Intravenous Q12H    Continuous Infusions:  [START ON 01/21/2024] vancomycin       PRN Meds: acetaminophen  **OR** acetaminophen , chlorproMAZINE , fentaNYL  (SUBLIMAZE ) injection, oxyCODONE , prochlorperazine   Physical Exam Vitals reviewed.  Constitutional:      General: He is not in acute distress. Cardiovascular:     Rate and Rhythm: Normal rate.  Pulmonary:     Effort: Pulmonary effort is normal.  Skin:    General: Skin is warm and dry.  Neurological:     Mental Status: He is alert and oriented to person, place, and time.  Psychiatric:        Mood and Affect: Affect is flat.             Vital Signs: BP 126/65 (BP Location: Left Arm)   Pulse 71   Temp 99.6 F (37.6 C) (Axillary)   Resp (!) 21   Ht 6' 1 (1.854 m)   Wt 68.7 kg   SpO2 100%   BMI 19.98 kg/m  SpO2: SpO2: 100 % O2 Device: O2 Device: Room Air O2 Flow Rate: O2 Flow Rate (L/min): 2 L/min    Patient Active Problem List   Diagnosis Date Noted   Bacteremia 01/18/2024   Bloodstream infection due to Port-A-Cath 01/18/2024   Hemodialysis catheter infection (HCC) 01/18/2024    Metastatic malignant neoplasm (HCC) 01/18/2024   Hematemesis 01/14/2024   Acute upper GI bleed 12/31/2023   Hyperbilirubinemia 12/31/2023   End-stage renal disease on hemodialysis (HCC) 12/31/2023   Chronic diastolic CHF (congestive heart failure) (HCC) 12/31/2023   History of essential hypertension 12/31/2023   Iron deficiency anemia 05/12/2023   Open  wound of left foot 05/12/2023   Essential hypertension 05/11/2023   CKD (chronic kidney disease) stage 4, GFR 15-29 ml/min (HCC) 05/08/2023   Obstructive jaundice due to malignant neoplasm (HCC) 05/07/2023   Malignant neoplasm of colon (HCC)    GI bleed 04/02/2021   Severe sepsis (HCC) 04/02/2021   AKI (acute kidney injury) (HCC) 04/02/2021   Acute metabolic encephalopathy 04/02/2021   Hypokalemia 04/02/2021   Encephalopathy    Nausea and vomiting    Coffee ground emesis    Elevated bilirubin    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 07/31/2020   IDA (iron deficiency anemia) from bleeding colon cancer 07/31/2020   Diabetic retinopathy (HCC) 07/31/2020   Hyperlipidemia 07/31/2020   Low back pain 07/31/2020   Shortness of breath 07/31/2020   Systolic heart failure (HCC) 07/31/2020   DM2 (diabetes mellitus, type 2) (HCC) 07/31/2020   ARF (acute renal failure) (HCC) 07/25/2020   Acute on chronic anemia    New onset of congestive heart failure (HCC) 07/24/2020   GIB (gastrointestinal bleeding) 07/24/2020   Hypertension associated with diabetes (HCC) 06/07/2019   Erectile dysfunction associated with type 2 diabetes mellitus (HCC) 05/08/2019   Sepsis (HCC) 01/22/2018   Diabetic ulcer of left foot (HCC) 01/22/2018   Status post transmetatarsal amputation of left foot (HCC) 01/22/2018   Diabetic mononeuropathy associated with type 2 diabetes mellitus (HCC) 03/21/2017   Microalbuminuria due to type 2 diabetes mellitus (HCC) 03/21/2017   Vitamin D deficiency 04/06/2009   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 male  with past medical history of ESRD (MWF), colon cancer s/p partial colectomy, HFpEF, DM, transmetatarsal amputation left foot, anemia (baseline 7-9), presenting with acute upper GI bleed and worsening anemia.  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    Palliative medicine was consulted for GOC conversations.  Today's Discussion: Reviewed chart and received update from nursing. Patient lying in bed with eyes closed with the television on.  He is in no apparent distress and easily wakes up when I say his name.  He reports he did not get much sleep last night but was able to take a nap after taking Thorazine  for hiccups.  [name] is at bedside.  We discussed the patient's chronic conditions and new diagnosis of metastatic colon cancer. We discussed he is currently ineligible for systemic chemotherapy due to his performance status. I shared my concern that he may not be able to improve his performance status enough to receive chemotherapy. We discussed the importance of the patient sharing his medical wishes and goals of care with his wife and proxy decision maker.    A discussion was had today regarding advanced directives. Concepts specific to code status, artifical feeding and hydration, continued IV antibiotics and rehospitalization was had. We discussed the difference between an aggressive medical intervention path and a comfort care path for this patient at this time was had. The MOST form was introduced and discussed. Patient confirms he is full code and full  scope. He shared that he would not want to be intubated for  more than a few days and would not want a feeding tube. They were not ready to complete the MOST form today but did want to review it. He is agreeable to seeing outpatient palliative care provider Jeffrey Young at the [facility].  Patient did not participate in the conversation as much as yesterday and often waited for his wife to repeat questions for him to answer. His wife is grateful to have a better understanding of his goals. He is agreeable to continued conversation and we agree that a colleague of mine will follow up in a couple days.  Discussed the importance of continued conversation with family and the medical providers regarding overall plan of care and treatment options, ensuring decisions are within the context of the patient's values and GOCs.   Encouraged patient and family to call PMT with questions or needs.  Recommendations/Plan: Full code Full scope of care  Time for decision making Encouraged patient and family to continue discussions re: goals of care Outpatient palliative care follow up with Jeffrey Young at [facility]- notified Nikki Continued PMT support    Code Status:    Code Status Orders  (From admission, onward)           Start     Ordered   01/14/24 2012  Full code  Continuous       Question:  By:  Answer:  Consent: discussion documented in EHR   01/14/24 2012         Extensive chart review has been completed prior to seeing the patient including labs, vital signs, imaging, progress/consult notes, orders, medications, and available advance directive documents.  Care plan was discussed with Dr. Dennise and bedside RN  Time spent: 65 minutes  Thank you for allowing the Palliative Medicine Team to assist in the care of this patient.    Stephane CHRISTELLA Palin, NP  Please contact Palliative Medicine Team phone at [phone] for questions and concerns.

## 2024-01-21 DIAGNOSIS — K920 Hematemesis: Secondary | ICD-10-CM | POA: Diagnosis not present

## 2024-01-21 LAB — CBC WITH DIFFERENTIAL/PLATELET
Abs Immature Granulocytes: 0.08 K/uL — ABNORMAL HIGH (ref 0.00–0.07)
Basophils Absolute: 0.1 K/uL (ref 0.0–0.1)
Basophils Relative: 1 %
Eosinophils Absolute: 0.2 K/uL (ref 0.0–0.5)
Eosinophils Relative: 2 %
HCT: 22.2 % — ABNORMAL LOW (ref 39.0–52.0)
Hemoglobin: 7.4 g/dL — ABNORMAL LOW (ref 13.0–17.0)
Immature Granulocytes: 1 %
Lymphocytes Relative: 10 %
Lymphs Abs: 0.9 K/uL (ref 0.7–4.0)
MCH: 29.7 pg (ref 26.0–34.0)
MCHC: 33.3 g/dL (ref 30.0–36.0)
MCV: 89.2 fL (ref 80.0–100.0)
Monocytes Absolute: 0.8 K/uL (ref 0.1–1.0)
Monocytes Relative: 9 %
Neutro Abs: 7 K/uL (ref 1.7–7.7)
Neutrophils Relative %: 77 %
Platelets: 206 K/uL (ref 150–400)
RBC: 2.49 MIL/uL — ABNORMAL LOW (ref 4.22–5.81)
RDW: 16 % — ABNORMAL HIGH (ref 11.5–15.5)
WBC: 9 K/uL (ref 4.0–10.5)
nRBC: 0 % (ref 0.0–0.2)

## 2024-01-21 LAB — COMPREHENSIVE METABOLIC PANEL WITH GFR
ALT: 17 U/L (ref 0–44)
AST: 25 U/L (ref 15–41)
Albumin: <1.5 g/dL — ABNORMAL LOW (ref 3.5–5.0)
Alkaline Phosphatase: 212 U/L — ABNORMAL HIGH (ref 38–126)
Anion gap: 10 (ref 5–15)
BUN: 29 mg/dL — ABNORMAL HIGH (ref 6–20)
CO2: 29 mmol/L (ref 22–32)
Calcium: 8.1 mg/dL — ABNORMAL LOW (ref 8.9–10.3)
Chloride: 92 mmol/L — ABNORMAL LOW (ref 98–111)
Creatinine, Ser: 6.02 mg/dL — ABNORMAL HIGH (ref 0.61–1.24)
GFR, Estimated: 10 mL/min — ABNORMAL LOW (ref >60)
Glucose, Bld: 187 mg/dL — ABNORMAL HIGH (ref 70–99)
Potassium: 3.6 mmol/L (ref 3.5–5.1)
Sodium: 131 mmol/L — ABNORMAL LOW (ref 135–145)
Total Bilirubin: 1.6 mg/dL — ABNORMAL HIGH (ref 0.0–1.2)
Total Protein: 5.3 g/dL — ABNORMAL LOW (ref 6.5–8.1)

## 2024-01-21 MED ORDER — VANCOMYCIN HCL 750 MG/150ML IV SOLN: 750.0000 mg | INTRAVENOUS | Status: AC

## 2024-01-21 MED ORDER — VANCOMYCIN HCL 750 MG/150ML IV SOLN
INTRAVENOUS | Status: AC
  Filled 2024-01-21: qty 150

## 2024-01-21 MED ORDER — PANTOPRAZOLE SODIUM 40 MG PO TBEC: 40.0000 mg | DELAYED_RELEASE_TABLET | Freq: Two times a day (BID) | ORAL | 2 refills | Status: DC

## 2024-01-21 NOTE — Discharge Summary (Signed)
 Jeffrey Young FMW:[id number] DOB: Sep 22, 1965 DOA: 01/14/2024  PCP: [facility]  Admit date: 01/14/2024  Discharge date: 01/21/2024  Admitted From: Home   Disposition:  Home   Recommendations for Outpatient Follow-up:   Follow up with PCP in 1-2 weeks  PCP Please obtain BMP/CBC, 2 view CXR in 1week,  (see Discharge instructions)   PCP Please follow up on the following pending results:    Home Health: PT   Equipment/Devices: as below  Consultations: GI, oncology, nephrology, palliative care Discharge Condition: Stable    CODE STATUS: Full    Diet Recommendation: Renal - Low Carb, check CBGs q. ACHS    Chief Complaint  Patient presents with   Abnormal Lab     Brief history of present illness from the day of admission and additional interim summary    58 year old male with history of ESRD (MWF), colon cancer s/p partial colectomy, HFpEF, DM, transmetatarsal amputation left foot, anemia (baseline 7-9), presenting with acute upper GI bleed and worsening anemia.                                                                  Hospital Course   Acute blood loss anemia, hematemesis- Acute UGI Bleed, history of stage III colon cancer with metastasis, rebleed, had similar episode few weeks ago.  Has been seen by GI, s/p 1 unit of packed RBC transfusion, underwent EGD on 01/15/2024 showing a gastric ulcer which was nonbleeding but did have adherent clot, continue to monitor CBC, continue PPI.  He was also seen by oncology and palliative care for now continue full medical care, he will follow-up with gastroenterologist Dr. Rollin and oncologist Dr. Beula for future goals of care..   Fever on running for the last 24 hours.  Blood cultures drawn morning of 01/16/2024, likely aspirated when he threw up some blood  however he also has biliary stent and could have a GI source also is a ESRD patient on HD, was placed on empiric antibiotics, had another hide temperature the night of 01/16/2024, 1 out of 2 cultures growing staph epi which is likely contamination, for now remains on broad-spectrum antibiotics, will check with ID, repeating blood cultures on 01/17/2024 per ID request they were drawn from the HD catheter in the right IJ by nephrology.  Also seen by ID.  His right arm AV fistula has matured and now it is being used for HD treatments, his temporary right IJ dialysis catheter was removed on 01/18/2024.   For now per ID 2 weeks of total IV vancomycin  with HD treatments MWF, last date 02/02/2024 per ID.     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 Cloretta. -Continue mirtazapine    Hypertension Blood pressure is on the softer side blood pressure medications have been adjusted at the time of discharge, PCP to monitor and adjust   Hypokalemia and hyponatremia - Treat with dialysis, nephrology on board, on MWF schedule will be dialyzed today but prior to discharge   Diabetes 2 Continue home regimen Discharge diagnosis     Principal Problem:   Hematemesis Active Problems:   DM2 (diabetes mellitus, type 2) (HCC)   Acute on chronic anemia   Malignant neoplasm of colon (HCC)   End-stage renal disease on hemodialysis (HCC)   Bacteremia   Bloodstream infection due to Port-A-Cath   Hemodialysis catheter infection (HCC)   Metastatic malignant neoplasm [facility])    Discharge instructions    Discharge Instructions     Discharge instructions   Complete by: As directed    Follow with [facility] in [facility] days   Get CBC, CMP, Magnesium   -  checked next visit with your primary MD   Activity: As tolerated with Full fall precautions use walker/cane  & assistance as needed  Disposition Home   Diet: Renal diet-low carbohydrate diet with 1.2 L fluid restriction per day.  Check CBGs q. ACHS.  Special Instructions: If you have smoked or chewed Tobacco  in the last 2 yrs please stop smoking, stop any regular Alcohol  and or any Recreational drug use.  On your next visit with your primary care physician please Get Medicines reviewed and adjusted.  Please request your Prim.MD to go over all Hospital Tests and Procedure/Radiological results at the follow up, please get all Hospital records sent to your Prim MD by signing hospital release before you go home.  If you experience worsening of your admission symptoms, develop shortness of breath, life threatening emergency, suicidal or homicidal thoughts you must seek medical attention immediately by calling 911 or calling your MD immediately  if symptoms less severe.  You Must read complete instructions/literature along with all the possible adverse reactions/side effects for all the Medicines you take and that have been prescribed to you. Take any new Medicines after you have completely understood and accpet all the possible adverse reactions/side effects.   Do not drive when taking Pain medications.  Do not take more than prescribed Pain, Sleep and Anxiety Medications  Wear Seat belts while driving.   Increase activity slowly   Complete by: As directed    No wound care   Complete by: As directed        Discharge Medications   Allergies as of 01/21/2024       Reactions   Bee Venom Anaphylaxis, Swelling, Other (See Comments)   Cold Sweats, also   Latex Rash, Dermatitis        Medication List     STOP taking these medications    furosemide  20 MG tablet Commonly known as: LASIX    losartan 50 MG tablet Commonly known as: COZAAR       TAKE these medications    0.9 % SODIUM CHLORIDE  (POUR BTL) OPTIME Irrigate with 1,000 mLs as directed daily.   amLODipine  10 MG  tablet Commonly known as: NORVASC  Take 10 mg by mouth daily.   baclofen  10 MG tablet Commonly known as: LIORESAL  Take 10 mg by mouth daily as needed for muscle spasms.   cadexomer iodine  0.9 % gel Commonly known as: IODOSORB Apply 1 Application topically daily as needed for wound care.   carvedilol  3.125 MG tablet Commonly  known as: COREG  Take 1 tablet (3.125 mg total) by mouth 2 (two) times daily with a meal.   EPINEPHrine  0.3 mg/0.3 mL Soaj injection Commonly known as: EPI-PEN Inject 0.3 mg into the muscle as needed for anaphylaxis.   ergocalciferol 1.25 MG (50000 UT) capsule Commonly known as: VITAMIN D2 Take 50,000 Units by mouth once a week. Take on Monday   feeding supplement (NEPRO CARB STEADY) Liqd Take 237 mLs by mouth daily. Mixed berry   ferrous sulfate 325 (65 FE) MG EC tablet Take 325 mg by mouth daily with breakfast.   Lantus  SoloStar 100 UNIT/ML Solostar Pen Generic drug: insulin  glargine Inject 10-15 Units into the skin See admin instructions. Inject 15 units into the skin in the morning and 10 units into the skin at bedtime   loperamide 2 MG capsule Commonly known as: IMODIUM Take 2 mg by mouth daily as needed for diarrhea or loose stools.   mirtazapine  15 MG tablet Commonly known as: REMERON  Take 7.5 mg by mouth at bedtime.   pantoprazole  40 MG tablet Commonly known as: Protonix  Take 1 tablet (40 mg total) by mouth 2 (two) times daily before a meal.   vancomycin  750 MG/150ML Soln Commonly known as: VANCOREADY Inject 150 mLs (750 mg total) into the vein every Monday, Wednesday, and Friday with hemodialysis for 12 days.               Durable Medical Equipment  (From admission, onward)           Start     Ordered   01/21/24 0914  For home use only DME Walker rolling  Once       Comments: 5 wheel  Question Answer Comment  Walker: With 5 Inch Wheels   Patient needs a walker to treat with the following condition Weakness       01/21/24 0913             Follow-up Information     [facility] Schedule an appointment as soon as possible for a visit in 1 week(s).   Contact information: [address] [facility] [place] [address] [phone]         Fleeta Rothman, Jomarie SAILOR, MD. Schedule an appointment as soon as possible for a visit in 2 week(s).   Specialty: Infectious Diseases Contact information: [address] [place] [address] [phone]         Cloretta Arley NOVAK, MD. Schedule an appointment as soon as possible for a visit in 1 week(s).   Specialty: Oncology Contact information: [address] [place] [address] [phone]         Rollin Dover, MD. Schedule an appointment as soon as possible for a visit in 2 week(s).   Specialty: Gastroenterology Contact information: [address] [place] [address] [phone]                 Major procedures and Radiology Reports - PLEASE review detailed and final reports thoroughly  -       IR Removal Tun Cv Cath W/O FL Result Date: 01/18/2024 INDICATION: History of ESRD on hemodialysis. New right AV graft placement with successful use. No longer needing tunneled dialysis catheter. Radiology consulted for tunneled dialysis catheter removal. EXAM: REMOVAL TUNNELED HEMODIALYSIS CATHETER MEDICATIONS: 5 mL 1% lidocaine  ANESTHESIA/SEDATION: None FLUOROSCOPY: None COMPLICATIONS: None immediate. PROCEDURE: Informed written consent was obtained from the patient after a thorough discussion of the procedural risks, benefits and alternatives. All questions were addressed. Maximal  Sterile Barrier Technique was utilized including caps, mask, sterile gowns, sterile gloves, sterile drape, hand hygiene and skin antiseptic. A timeout was performed prior to the initiation of the procedure. The patient's right chest and catheter was prepped and draped in a normal sterile fashion. Heparin  was  removed from both ports of catheter. 1% lidocaine  was used for local anesthesia. Using gentle blunt dissection the cuff of the catheter was exposed and the catheter was removed in it's entirety. Pressure was held till hemostasis was obtained. A sterile dressing was applied. The patient tolerated the procedure well with no immediate complications. IMPRESSION: Successful catheter removal as described above. Performed by: Wyatt Pommier, PA-C Electronically Signed   By: Wilkie Lent M.D.   On: 01/18/2024 17:25   DG Abd Portable 1V Result Date: 01/16/2024 EXAM: 1 VIEW XRAY OF THE ABDOMEN 01/16/2024 01:41:00 PM COMPARISON: None available. CLINICAL HISTORY: Biliary stent migration FINDINGS: BOWEL: Nonobstructive bowel gas pattern. The proximal portion of the biliary stent projects over the inferior portion of the T12 vertebra and the distal portion of the stent projects over the midline of the L2 vertebra. There is some rotational artifact with the stent in the projection of the spine. On the ERCP images, the proximal portion of the stent was at the superior aspect of the T12 level with the distal portion of the stent was over the L2 level. SOFT TISSUES: No opaque urinary calculi. Biliary stent in upper abdomen. Surgical chain sutures in left hemiabdomen. Partially imaged vascular catheter tip in right atrium. BONES: No acute osseous abnormality. IMPRESSION: 1. Slight rotational artifact with biliary stent projecting over the lower thoracic and upper lumbar spine. 2. Cannot exclude mild antegrade migration of the biliary stent. Biliary stent in upper abdomen with some rotational artifact, consistent with the reported migration. Electronically signed by: Waddell Calk MD 01/16/2024 02:08 PM EDT RP Workstation: [id number]   DG Chest Port 1 View Result Date: 01/16/2024 EXAM: 1 VIEW XRAY OF THE CHEST 01/16/2024 06:11:00 AM COMPARISON: 12/06/2023 CLINICAL HISTORY: SOB (shortness of breath) 141880. Reason for exam:  SOB FINDINGS: LUNGS AND PLEURA: Moderate right and small left pleural effusions with associated bibasilar opacities. Mild pulmonary edema. HEART AND MEDIASTINUM: Mild cardiomegaly. BONES AND SOFT TISSUES: No acute osseous abnormality. LINES AND TUBES: Right IJ CVC in place with tip overlying proximal right atrium. Left chest port in place with catheter tip overlying right atrium. IMPRESSION: 1. Moderate right and small left pleural effusions with associated bibasilar opacities. 2. Mild pulmonary edema. 3. Mild cardiomegaly. Electronically signed by: Waddell Calk MD 01/16/2024 06:26 AM EDT RP Workstation: [id number]   DG ERCP Result Date: 01/05/2024 CLINICAL DATA:  [id number] Surgery, elective [id number] EXAM: ERCP COMPARISON:  CT AP, 01/01/2024 and CT CAP, 10/13/2023. FLUOROSCOPY: Radiation Exposure Index and estimated peak skin dose (PSD); Reference air kerma (RAK), 39.03 mGy. FINDINGS: Limited oblique planar images of the RIGHT upper quadrant obtained C-arm. Images demonstrating flexible endoscopy, biliary duct cannulation, plastic biliary stent removal (x2), retrograde cholangiogram and metallic biliary stent placement Mild extrahepatic biliary ductal dilation. No discrete biliary filling defect is demonstrated. IMPRESSION: Fluoroscopic imaging for ERCP, plastic biliary stent removal and metallic biliary stent placement. For complete description of intra procedural findings, please see performing service dictation. Electronically Signed   By: Thom Hall M.D.   On: 01/05/2024 14:40   CT ABDOMEN PELVIS WO CONTRAST Result Date: 01/01/2024 CLINICAL DATA:  Abdominal pain. Hyperbilirubinemia. History of obstructive jaundice in December 2024. EXAM: CT ABDOMEN AND PELVIS WITHOUT CONTRAST TECHNIQUE: Multidetector  CT imaging of the abdomen and pelvis was performed following the standard protocol without IV contrast. RADIATION DOSE REDUCTION: This exam was performed according to the departmental dose-optimization program  which includes automated exposure control, adjustment of the mA and/or kV according to patient size and/or use of iterative reconstruction technique. COMPARISON:  10/13/2023 and older exams. MR with MRCP dated 05/15/2023. FINDINGS: Lower chest: Small, right greater than left, pleural effusions. There is associated dependent lower lobe opacity, right greater than left, consistent with atelectasis. Hepatobiliary: Liver normal in size and overall attenuation vague 1.4 cm mass bulges the anterior contour of segment 3, not appreciated on prior imaging. No other evidence of a liver mass or focal lesion. There is intra and extrahepatic bile duct dilation despite the presence of biliary stents. This is increased from the prior CT, common hepatic duct measuring 7 8 mm, previously 6 mm. Gallbladder is distended.  No gallstones or wall thickening. Pancreas: Oval mass arises from the anterior superior margin pancreatic body, slightly hyperattenuating to the remaining pancreas, measuring 2.3 x 2.1 x 2.5 cm. This was not evident on prior studies. No other evidence of a pancreatic mass. No inflammation or duct dilation. Spleen: Normal in size without focal abnormality. Adrenals/Urinary Tract: No adrenal mass. Kidneys normal in overall size and position. Bilateral perinephric stranding is similar to the prior CT. Oval hypoattenuating homogeneous mass, mid to lower pole the right kidney, 3.6 cm, stable consistent with a cyst. No follow-up indicated no other defined renal masses, no stones and no hydronephrosis. Ureters are normal in course and in caliber. Bladder is unremarkable. Stomach/Bowel: Stomach unremarkable. Small bowel and colon are normal caliber. No wall thickening. No inflammation. Stable changes from a left transverse colon anastomosis. Vascular/Lymphatic: There are enlarged shoddy gastrohepatic ligament lymph nodes and porta hepatis nodes, measuring up to 1.4 cm in short axis, somewhat better appreciated the current  exam, but otherwise not convincingly changed. There are also prominent, but subcentimeter periceliac nodes. No other evidence of lymphadenopathy. No significant vascular abnormality on this unenhanced study. Reproductive: Mild prostate enlargement measuring 5.3 x 3.9 x 4.0 cm, unchanged. Other: No ascites. Musculoskeletal: No fracture or acute finding. No osteoblastic or osteolytic lesions. IMPRESSION: 1. Interval increase intrahepatic bile duct dilation compared to the CT from 10/13/2023. 2. Apparent mass arising from the anterior superior margin of the pancreatic body, 2.5 cm. This could reflect either a primary pancreatic mass or adjacent enlarged lymph node. 3. Subtle hypoattenuating, 1.4 cm masslike lesion mildly bulges the anterior contour of the left liver lobe, segment 3, not evident prior exams. 4. Persistent shoddy lymphadenopathy centered along gastrohepatic ligament. 5. Combination of these consistent with worsening metastatic disease. 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 Findings are most suspicious for either a pancreatic primary carcinoma or cholangiocarcinoma. 6. Small, right greater than left, pleural effusions with associated dependent atelectasis, new since prior CT. Electronically Signed   By: Alm Parkins M.D.   On: 01/01/2024 08:25   VAS US  DUPLEX DIALYSIS ACCESS (AVF, AVG) Result Date: 12/25/2023 DIALYSIS ACCESS Patient Name:  Keontay Vora  Date of Exam:   12/25/2023 Medical Rec #: [id number]       Accession #:    [id number] Date of Birth: November 24, 1965       Patient Gender: M Patient Age:   39 years Exam Location:  [address] Procedure:      VAS US  DUPLEX DIALYSIS ACCESS (AVF, AVG) Referring Phys: DEBBY ROBERTSON --------------------------------------------------------------------------------  Reason for  Exam: ESRD. Access Site: Right Upper Extremity. Access Type: Brachial A- Axillary v graft. Comparison Study: N/A Performing  Technologist: Dena Pane  Examination Guidelines: A complete evaluation includes B-mode imaging, spectral Doppler, color Doppler, and power Doppler as needed of all accessible portions of each vessel. Unilateral testing is considered an integral part of a complete examination. Limited examinations for reoccurring indications may be performed as noted.  Findings:   +--------------------+----------+-----------------+---------+ AVG                 PSV (cm/s)Flow Vol (mL/min)Describe  +--------------------+----------+-----------------+---------+ Native artery inflow   244                               +--------------------+----------+-----------------+---------+ Arterial anastomosis   477                               +--------------------+----------+-----------------+---------+ Prox graft             1041                    narrowing +--------------------+----------+-----------------+---------+ Mid graft              170                               +--------------------+----------+-----------------+---------+ Distal graft           357                               +--------------------+----------+-----------------+---------+ Venous anastomosis     201                               +--------------------+----------+-----------------+---------+ Venous outflow         157                               +--------------------+----------+-----------------+---------+ Narrowing seen at the proximal graft  Summary: Patent right brachial artery to axillary vein arteriovenous graft. Narrowing is visualized in the proximal graft with a velocity of 1041 cm/s.  *See table(s) above for measurements and observations.  Diagnosing physician: Debby Robertson Electronically signed by Debby Robertson on 12/25/2023 at 5:33:35 PM.    --------------------------------------------------------------------------------   Final     Micro Results         Today   Subjective    Helayne Fuse today  has no headache,no chest abdominal pain,no new weakness tingling or numbness, feels much better wants to go home today.     Objective   Blood pressure 125/65, pulse 76, temperature 98.7 F (37.1 C), resp. rate (!) 22, height 6' 1 (1.854 m), weight 73.1 kg, SpO2 96%.  No intake or output data in the 24 hours ending 01/21/24 0918  Exam  Awake Alert, No new F.N deficits,    [place].AT,PERRAL Supple Neck,   Symmetrical Chest wall movement, Good air movement bilaterally, CTAB RRR,No Gallops,   +ve B.Sounds, Abd Soft, Non tender,  No Cyanosis, Clubbing or edema    Data Review   Recent Labs  Lab 01/17/24 0623 01/18/24 0553 01/19/24 0710 01/20/24 0757 01/21/24 0607  WBC 16.1* 14.6* 12.8* 11.3* 9.0  HGB 8.8* 8.4* 8.3* 7.8*  7.4*  HCT 26.6* 25.1* 25.7* 23.6* 22.2*  PLT 183 191 205 211 206  MCV 89.6 89.0 90.2 89.7 89.2  MCH 29.6 29.8 29.1 29.7 29.7  MCHC 33.1 33.5 32.3 33.1 33.3  RDW 16.1* 16.2* 16.1* 16.0* 16.0*  LYMPHSABS 1.2 1.3 1.0 1.1 0.9  MONOABS 1.6* 1.4* 1.2* 1.0 0.8  EOSABS 0.1 0.1 0.2 0.2 0.2  BASOSABS 0.1 0.1 0.1 0.1 0.1    Recent Labs  Lab 01/17/24 0623 01/17/24 1444 01/18/24 0553 01/19/24 0710 01/20/24 0757 01/21/24 0607  NA 133*  --  131* 132* 129* 131*  K 3.5  --  3.9 3.5 3.6 3.6  CL 95*  --  92* 93* 92* 92*  CO2 28  --  21* 25 24 29   ANIONGAP 10  --  18* 14 13 10   GLUCOSE 120*  --  131* 184* 185* 187*  BUN 18  --  29* 20 25* 29*  CREATININE 3.63*  --  4.75* 3.58* 4.64* 6.02*  AST 37  --  34 24 22 25   ALT 29  --  24 20 17 17   ALKPHOS 194*  --  188* 198* 224* 212*  BILITOT 2.5*  --  2.0* 1.9* 1.6* 1.6*  ALBUMIN  1.5*  --  <1.5* 1.6* 1.5* <1.5*  CRP  --  23.3*  --   --   --   --   PROCALCITON 16.73  --   --   --   --   --   PHOS  --   --  2.7  --   --   --   CALCIUM  8.0*  --  8.0* 8.1* 8.1* 8.1*    Total Time in preparing paper work, data evaluation and todays exam - 35 minutes  Signature  -    Lavada Stank M.D on 01/21/2024 at 9:18 AM   -  To page  go to [email or web address]

## 2024-01-21 NOTE — Plan of Care (Signed)

## 2024-01-21 NOTE — Discharge Instructions (Addendum)
 Follow with [facility] in [facility] days   Get CBC, CMP, Magnesium   -  checked next visit with your primary MD   Activity: As tolerated with Full fall precautions use walker/cane & assistance as needed  Disposition Home   Diet: Renal diet-low carbohydrate diet with 1.2 L fluid restriction per day.  Check CBGs q. ACHS.  Special Instructions: If you have smoked or chewed Tobacco  in the last 2 yrs please stop smoking, stop any regular Alcohol  and or any Recreational drug use.  On your next visit with your primary care physician please Get Medicines reviewed and adjusted.  Please request your Prim.MD to go over all Hospital Tests and Procedure/Radiological results at the follow up, please get all Hospital records sent to your Prim MD by signing hospital release before you go home.  If you experience worsening of your admission symptoms, develop shortness of breath, life threatening emergency, suicidal or homicidal thoughts you must seek medical attention immediately by calling 911 or calling your MD immediately  if symptoms less severe.  You Must read complete instructions/literature along with all the possible adverse reactions/side effects for all the Medicines you take and that have been prescribed to you. Take any new Medicines after you have completely understood and accpet all the possible adverse reactions/side effects.   Do not drive when taking Pain medications.  Do not take more than prescribed Pain, Sleep and Anxiety Medications  Wear Seat belts while driving.

## 2024-01-21 NOTE — Plan of Care (Signed)
 Problem: Education: Goal: Ability to describe self-care measures that may prevent or decrease complications (Diabetes Survival Skills Education) will improve 01/21/2024 1527 by Andra Vermell CROME, RN Outcome: Adequate for Discharge 01/21/2024 1524 by Andra Vermell CROME, RN Outcome: Adequate for Discharge Goal: Individualized Educational Video(s) 01/21/2024 1527 by Andra Vermell CROME, RN Outcome: Adequate for Discharge 01/21/2024 1524 by Andra Vermell CROME, RN Outcome: Adequate for Discharge   Problem: Coping: Goal: Ability to adjust to condition or change in health will improve 01/21/2024 1527 by Andra Vermell CROME, RN Outcome: Adequate for Discharge 01/21/2024 1524 by Andra Vermell CROME, RN Outcome: Adequate for Discharge   Problem: Fluid Volume: Goal: Ability to maintain a balanced intake and output will improve 01/21/2024 1527 by Andra Vermell CROME, RN Outcome: Adequate for Discharge 01/21/2024 1524 by Andra Vermell CROME, RN Outcome: Adequate for Discharge   Problem: Health Behavior/Discharge Planning: Goal: Ability to identify and utilize available resources and services will improve 01/21/2024 1527 by Andra Vermell CROME, RN Outcome: Adequate for Discharge 01/21/2024 1524 by Andra Vermell CROME, RN Outcome: Adequate for Discharge Goal: Ability to manage health-related needs will improve 01/21/2024 1527 by Andra Vermell CROME, RN Outcome: Adequate for Discharge 01/21/2024 1524 by Andra Vermell CROME, RN Outcome: Adequate for Discharge   Problem: Metabolic: Goal: Ability to maintain appropriate glucose levels will improve 01/21/2024 1527 by Andra Vermell CROME, RN Outcome: Adequate for Discharge 01/21/2024 1524 by Andra Vermell CROME, RN Outcome: Adequate for Discharge   Problem: Nutritional: Goal: Maintenance of adequate nutrition will improve 01/21/2024 1527 by Andra Vermell CROME, RN Outcome: Adequate for Discharge 01/21/2024 1524 by Andra Vermell CROME, RN Outcome: Adequate for Discharge Goal: Progress toward achieving an  optimal weight will improve 01/21/2024 1527 by Andra Vermell CROME, RN Outcome: Adequate for Discharge 01/21/2024 1524 by Andra Vermell CROME, RN Outcome: Adequate for Discharge   Problem: Skin Integrity: Goal: Risk for impaired skin integrity will decrease 01/21/2024 1527 by Andra Vermell CROME, RN Outcome: Adequate for Discharge 01/21/2024 1524 by Andra Vermell CROME, RN Outcome: Adequate for Discharge   Problem: Tissue Perfusion: Goal: Adequacy of tissue perfusion will improve 01/21/2024 1527 by Andra Vermell CROME, RN Outcome: Adequate for Discharge 01/21/2024 1524 by Andra Vermell CROME, RN Outcome: Adequate for Discharge   Problem: Education: Goal: Knowledge of General Education information will improve Description: Including pain rating scale, medication(s)/side effects and non-pharmacologic comfort measures 01/21/2024 1527 by Andra Vermell CROME, RN Outcome: Adequate for Discharge 01/21/2024 1524 by Andra Vermell CROME, RN Outcome: Adequate for Discharge   Problem: Health Behavior/Discharge Planning: Goal: Ability to manage health-related needs will improve 01/21/2024 1527 by Andra Vermell CROME, RN Outcome: Adequate for Discharge 01/21/2024 1524 by Andra Vermell CROME, RN Outcome: Adequate for Discharge   Problem: Clinical Measurements: Goal: Ability to maintain clinical measurements within normal limits will improve 01/21/2024 1527 by Andra Vermell CROME, RN Outcome: Adequate for Discharge 01/21/2024 1524 by Andra Vermell CROME, RN Outcome: Adequate for Discharge Goal: Will remain free from infection 01/21/2024 1527 by Andra Vermell CROME, RN Outcome: Adequate for Discharge 01/21/2024 1524 by Andra Vermell CROME, RN Outcome: Adequate for Discharge Goal: Diagnostic test results will improve 01/21/2024 1527 by Andra Vermell CROME, RN Outcome: Adequate for Discharge 01/21/2024 1524 by Andra Vermell CROME, RN Outcome: Adequate for Discharge Goal: Respiratory complications will improve 01/21/2024 1527 by Andra Vermell CROME, RN Outcome:  Adequate for Discharge 01/21/2024 1524 by Andra Vermell CROME, RN Outcome: Adequate for Discharge Goal: Cardiovascular complication will be avoided 01/21/2024 1527 by Andra Vermell CROME, RN Outcome:  Adequate for Discharge 01/21/2024 1524 by Andra Vermell CROME, RN Outcome: Adequate for Discharge   Problem: Activity: Goal: Risk for activity intolerance will decrease 01/21/2024 1527 by Andra Vermell CROME, RN Outcome: Adequate for Discharge 01/21/2024 1524 by Andra Vermell CROME, RN Outcome: Adequate for Discharge   Problem: Nutrition: Goal: Adequate nutrition will be maintained 01/21/2024 1527 by Andra Vermell CROME, RN Outcome: Adequate for Discharge 01/21/2024 1524 by Andra Vermell CROME, RN Outcome: Adequate for Discharge   Problem: Coping: Goal: Level of anxiety will decrease 01/21/2024 1527 by Andra Vermell CROME, RN Outcome: Adequate for Discharge 01/21/2024 1524 by Andra Vermell CROME, RN Outcome: Adequate for Discharge   Problem: Elimination: Goal: Will not experience complications related to bowel motility 01/21/2024 1527 by Andra Vermell CROME, RN Outcome: Adequate for Discharge 01/21/2024 1524 by Andra Vermell CROME, RN Outcome: Adequate for Discharge Goal: Will not experience complications related to urinary retention 01/21/2024 1527 by Andra Vermell CROME, RN Outcome: Adequate for Discharge 01/21/2024 1524 by Andra Vermell CROME, RN Outcome: Adequate for Discharge   Problem: Pain Managment: Goal: General experience of comfort will improve and/or be controlled 01/21/2024 1527 by Andra Vermell CROME, RN Outcome: Adequate for Discharge 01/21/2024 1524 by Andra Vermell CROME, RN Outcome: Adequate for Discharge   Problem: Safety: Goal: Ability to remain free from injury will improve 01/21/2024 1527 by Andra Vermell CROME, RN Outcome: Adequate for Discharge 01/21/2024 1524 by Andra Vermell CROME, RN Outcome: Adequate for Discharge   Problem: Skin Integrity: Goal: Risk for impaired skin integrity will decrease 01/21/2024 1527 by Andra Vermell CROME, RN Outcome: Adequate for Discharge 01/21/2024 1524 by Andra Vermell CROME, RN Outcome: Adequate for Discharge

## 2024-01-21 NOTE — Plan of Care (Signed)
 Problem: Education: Goal: Ability to describe self-care measures that may prevent or decrease complications (Diabetes Survival Skills Education) will improve Outcome: Progressing Goal: Individualized Educational Video(s) Outcome: Progressing   Problem: Coping: Goal: Ability to adjust to condition or change in health will improve Outcome: Progressing   Problem: Fluid Volume: Goal: Ability to maintain a balanced intake and output will improve Outcome: Progressing   Problem: Health Behavior/Discharge Planning: Goal: Ability to identify and utilize available resources and services will improve Outcome: Progressing Goal: Ability to manage health-related needs will improve Outcome: Progressing   Problem: Metabolic: Goal: Ability to maintain appropriate glucose levels will improve Outcome: Progressing   Problem: Nutritional: Goal: Maintenance of adequate nutrition will improve Outcome: Progressing Goal: Progress toward achieving an optimal weight will improve Outcome: Progressing   Problem: Skin Integrity: Goal: Risk for impaired skin integrity will decrease Outcome: Progressing   Problem: Tissue Perfusion: Goal: Adequacy of tissue perfusion will improve Outcome: Progressing   Problem: Education: Goal: Knowledge of General Education information will improve Description: Including pain rating scale, medication(s)/side effects and non-pharmacologic comfort measures Outcome: Progressing   Problem: Health Behavior/Discharge Planning: Goal: Ability to manage health-related needs will improve Outcome: Progressing   Problem: Clinical Measurements: Goal: Ability to maintain clinical measurements within normal limits will improve Outcome: Progressing Goal: Will remain free from infection Outcome: Progressing Goal: Diagnostic test results will improve Outcome: Progressing Goal: Respiratory complications will improve Outcome: Progressing Goal: Cardiovascular complication will  be avoided Outcome: Progressing   Problem: Activity: Goal: Risk for activity intolerance will decrease Outcome: Progressing   Problem: Nutrition: Goal: Adequate nutrition will be maintained Outcome: Progressing   Problem: Coping: Goal: Level of anxiety will decrease Outcome: Progressing   Problem: Elimination: Goal: Will not experience complications related to bowel motility Outcome: Progressing Goal: Will not experience complications related to urinary retention Outcome: Progressing   Problem: Pain Managment: Goal: General experience of comfort will improve and/or be controlled Outcome: Progressing   Problem: Safety: Goal: Ability to remain free from injury will improve Outcome: Progressing   Problem: Skin Integrity: Goal: Risk for impaired skin integrity will decrease Outcome: Progressing   Problem: Education: Goal: Ability to describe self-care measures that may prevent or decrease complications (Diabetes Survival Skills Education) will improve Outcome: Progressing Goal: Individualized Educational Video(s) Outcome: Progressing   Problem: Coping: Goal: Ability to adjust to condition or change in health will improve Outcome: Progressing   Problem: Fluid Volume: Goal: Ability to maintain a balanced intake and output will improve Outcome: Progressing   Problem: Health Behavior/Discharge Planning: Goal: Ability to identify and utilize available resources and services will improve Outcome: Progressing Goal: Ability to manage health-related needs will improve Outcome: Progressing   Problem: Metabolic: Goal: Ability to maintain appropriate glucose levels will improve Outcome: Progressing   Problem: Nutritional: Goal: Maintenance of adequate nutrition will improve Outcome: Progressing Goal: Progress toward achieving an optimal weight will improve Outcome: Progressing   Problem: Skin Integrity: Goal: Risk for impaired skin integrity will decrease Outcome:  Progressing   Problem: Tissue Perfusion: Goal: Adequacy of tissue perfusion will improve Outcome: Progressing   Problem: Education: Goal: Knowledge of General Education information will improve Description: Including pain rating scale, medication(s)/side effects and non-pharmacologic comfort measures Outcome: Progressing   Problem: Health Behavior/Discharge Planning: Goal: Ability to manage health-related needs will improve Outcome: Progressing   Problem: Clinical Measurements: Goal: Ability to maintain clinical measurements within normal limits will improve Outcome: Progressing Goal: Will remain free from infection Outcome: Progressing Goal: Diagnostic test results will  improve Outcome: Progressing Goal: Respiratory complications will improve Outcome: Progressing Goal: Cardiovascular complication will be avoided Outcome: Progressing   Problem: Activity: Goal: Risk for activity intolerance will decrease Outcome: Progressing   Problem: Nutrition: Goal: Adequate nutrition will be maintained Outcome: Progressing   Problem: Coping: Goal: Level of anxiety will decrease Outcome: Progressing   Problem: Elimination: Goal: Will not experience complications related to bowel motility Outcome: Progressing Goal: Will not experience complications related to urinary retention Outcome: Progressing   Problem: Pain Managment: Goal: General experience of comfort will improve and/or be controlled Outcome: Progressing   Problem: Safety: Goal: Ability to remain free from injury will improve Outcome: Progressing   Problem: Skin Integrity: Goal: Risk for impaired skin integrity will decrease Outcome: Progressing

## 2024-01-21 NOTE — Progress Notes (Signed)
 Patient escorted to front lobby by NT and accompanied with family.

## 2024-01-21 NOTE — TOC Transition Note (Addendum)
 Transition of Care [facility]) - Discharge Note   Patient Details  Name: Jeffrey Young MRN: [id number] Date of Birth: 04/26/1966  Transition of Care [facility]) CM/SW Contact:  Robynn Eileen Hoose, RN Phone Number: 01/21/2024, 9:41 AM   Clinical Narrative:   Patient is being discharged today. Order for rolling walker noted and emailed securely to [email or web address].  HHPT order noted, message sent to [facility] with [facility], awaiting response.  [id number] Huxley with [facility] accepted for HHPT, contact information placed on AVS.    Final next level of care: Home w Home Health Services Barriers to Discharge: No Barriers Identified   Patient Goals and CMS Choice            Discharge Placement                       Discharge Plan and Services Additional resources added to the After Visit Summary for     Discharge Planning Services: CM Consult            DME Arranged: Vannie rolling DME Agency: Other - Comment ([place]) Date DME Agency Contacted: 01/21/24 Time DME Agency Contacted: [phone] Representative spoke with at DME Agency: Secure email sent to [email or web address]            Social Drivers of Health (SDOH) Interventions SDOH Screenings   Food Insecurity: No Food Insecurity (01/15/2024)  Housing: Low Risk  (01/15/2024)  Transportation Needs: No Transportation Needs (01/15/2024)  Utilities: Not At Risk (01/15/2024)  Depression (PHQ2-9): Low Risk  (11/20/2023)  Financial Resource Strain: Low Risk  (05/30/2023)   Received from [facility]  Physical Activity: Inactive (12/20/2020)   Received from [facility]  Social Connections: Unknown (09/30/2021)   Received from [facility]  Stress: No Stress Concern Present (12/20/2020)   Received from [facility]  Tobacco Use: Low Risk  (01/14/2024)     Readmission Risk Interventions     No data to display

## 2024-01-21 NOTE — Progress Notes (Signed)
 [facility] Progress Note   Subjective:   Seen on HD - 2L UFG and tolerated. Plan is d/c today - will arrange IV Vanc with HD.  Objective Vitals:   01/21/24 0930 01/21/24 1001 01/21/24 1020 01/21/24 1050  BP: (!) 167/67 (!) 167/76 (!) 153/79 133/70  Pulse: 79 79 77 79  Resp: (!) 22 (!) 21 17 15   Temp:      TempSrc:      SpO2: 99% 98% 100% 100%  Weight:      Height:       Physical Exam General: Well appearing man, NAD Heart: RRR Lungs: CTA anteriorly Abdomen: soft Extremities: no LE edema Dialysis Access: AVG +t/b  Additional Objective Labs: Basic Metabolic Panel: Recent Labs  Lab 01/18/24 0553 01/19/24 0710 01/20/24 0757 01/21/24 0607  NA 131* 132* 129* 131*  K 3.9 3.5 3.6 3.6  CL 92* 93* 92* 92*  CO2 21* 25 24 29   GLUCOSE 131* 184* 185* 187*  BUN 29* 20 25* 29*  CREATININE 4.75* 3.58* 4.64* 6.02*  CALCIUM  8.0* 8.1* 8.1* 8.1*  PHOS 2.7  --   --   --    Liver Function Tests: Recent Labs  Lab 01/19/24 0710 01/20/24 0757 01/21/24 0607  AST 24 22 25   ALT 20 17 17   ALKPHOS 198* 224* 212*  BILITOT 1.9* 1.6* 1.6*  PROT 5.5* 5.5* 5.3*  ALBUMIN  1.6* 1.5* <1.5*   CBC: Recent Labs  Lab 01/17/24 0623 01/18/24 0553 01/19/24 0710 01/20/24 0757 01/21/24 0607  WBC 16.1* 14.6* 12.8* 11.3* 9.0  NEUTROABS 13.0* 11.6* 10.3* 8.9* 7.0  HGB 8.8* 8.4* 8.3* 7.8* 7.4*  HCT 26.6* 25.1* 25.7* 23.6* 22.2*  MCV 89.6 89.0 90.2 89.7 89.2  PLT 183 191 205 211 206   Blood Culture    Component Value Date/Time   SDES BLOOD SITE NOT SPECIFIED 01/17/2024 1045   SDES BLOOD SITE NOT SPECIFIED 01/17/2024 1045   SPECREQUEST  01/17/2024 1045    BOTTLES DRAWN AEROBIC AND ANAEROBIC Blood Culture adequate volume   SPECREQUEST  01/17/2024 1045    BOTTLES DRAWN AEROBIC AND ANAEROBIC Blood Culture adequate volume   CULT  01/17/2024 1045    NO GROWTH 4 DAYS Performed at [facility], [address] [name], [place] [address]    CULT  01/17/2024 1045    NO  GROWTH 4 DAYS Performed at [facility], [address] [place], [place] [address]    REPTSTATUS PENDING 01/17/2024 1045   REPTSTATUS PENDING 01/17/2024 1045   Medications:  vancomycin       sodium chloride    Intravenous Once   calcitRIOL   0.25 mcg Oral Daily   Chlorhexidine  Gluconate Cloth  6 each Topical Q0600   insulin  aspart  0-6 Units Subcutaneous TID WC   mirtazapine   7.5 mg Oral Daily   pantoprazole  (PROTONIX ) IV  40 mg Intravenous Q12H   sodium chloride  flush  3 mL Intravenous Q12H    Dialysis Orders [facility] MWF EDW 74 kg - reported that he is losing weight and has poor appetite 4 hours 3K/2.5 Ca bath 400/600 No ESA due to malignancy Hectorol 1 mcg  Assessment/Plan:  Upper GI bleed: S/p hematemesis post HD on 01/14/24. Hgb 6.6 on admit, s/p 1 unit of PRBC in ED.  Patient just hospitalized for similar episode on 8/12-8/14 and had stent placed during ERCP. EGD 01/15/2024 showed a gastric ulcer with adherent clot, reflux esophagitis but no active bleed. On PPI BID. Fevers, ?possible Staph  Epi bacteremia: Blood Cx 8/27 grew both MRSE and Strep constellatus. TDC removed 8/29. Per ID, plan is 2 week course of Vanc 750mg  q HD until 01/31/24 -> spoke with charge RN today to communicate EDW lowering, access change, and abx orders. ESRD: Continue MWF schedule - on HD now, using AVG without issues. HFpEF: Losing weight, lowering EDW. Hyperbilirubinemia: Improving. CT noted increase in intrahepatic vile duct dilation compared from CT 10/13/23. Appears to be second to pancreatic mass or lymphadenopathy. Had stent replaced 01/02/24 by Dr. Rollin.   Hypertension/volume: BP decent, losing weight - EDW ~72kg at this time.  Anemia: Not on ESA due to malignancy. S/p 1U PRBCs on 8/26.  Metabolic bone disease: CorrCa ok, getting calcitriol  here - can convert back to hectoral on discharge. Phos low end, no binders.   Nutrition: Alb < 1.5, would rec protein supplements. On mirtazipine as  appetite stimulant.  DM: on SSI per primary.    Izetta Boehringer, PA-C 01/21/2024, 11:18 AM  [employer]

## 2024-01-21 NOTE — Progress Notes (Signed)
   01/21/24 1244  Vitals  Temp 97.6 F (36.4 C)  Pulse Rate 76  Resp (!) 21  BP (!) 145/73  SpO2 99 %  O2 Device Room Air  Weight 71.9 kg  Type of Weight Post-Dialysis  Oxygen Therapy  Patient Activity (if Appropriate) In bed  Pulse Oximetry Type Continuous  Post Treatment  Dialyzer Clearance Lightly streaked  Hemodialysis Intake (mL) 100 mL  Liters Processed 73  Fluid Removed (mL) 2200 mL  Tolerated HD Treatment Yes  AVG/AVF Arterial Site Held (minutes) 6 minutes  AVG/AVF Venous Site Held (minutes) 6 minutes   Received patient in bed to unit.  Alert and oriented.  Informed consent signed and in chart.   TX duration:3.15 per Dr. Geralynn  Patient tolerated well.  Transported back to the room  Alert, without acute distress.  Hand-off given to patient's nurse.   Access used: RIJDLC Access issues: no complications  Total UF removed: 2200 Medication(s) given: Vancomycin  750mg  IV x 1   [facility]

## 2024-01-22 ENCOUNTER — Telehealth: Payer: Self-pay | Admitting: Oncology

## 2024-01-22 ENCOUNTER — Encounter: Payer: Self-pay | Admitting: *Deleted

## 2024-01-22 ENCOUNTER — Other Ambulatory Visit: Payer: Self-pay | Admitting: *Deleted

## 2024-01-22 DIAGNOSIS — C189 Malignant neoplasm of colon, unspecified: Secondary | ICD-10-CM

## 2024-01-22 LAB — CULTURE, BLOOD (ROUTINE X 2)
Culture: NO GROWTH
Culture: NO GROWTH
Special Requests: ADEQUATE
Special Requests: ADEQUATE

## 2024-01-22 NOTE — Progress Notes (Signed)
 Late Note Entry- Sept 2, 2025  Pt d/c yesterday. Contacted [facility] [place] [facility] CSW this morning to be advised of pt's d/c date and that pt should resume care tomorrow. D/C summary and last renal note faxed to clinic for continuation of care. Renal PA contacted clinic yesterday regarding pt's iv abx needs.   Randine Mungo Dialysis Navigator [phone]

## 2024-01-22 NOTE — Telephone Encounter (Signed)
 Wife called to let us  know that PT is out of hospital and to reschedule appointments.

## 2024-01-22 NOTE — Progress Notes (Signed)
 Hospital D/C on 01/21/24. Wife requesting f/u this week (dialysis MWF). High priority scheduling message sent to schedule for lab/flush/OV on 9/4 with Lacie Burton, NP.

## 2024-01-23 ENCOUNTER — Other Ambulatory Visit: Payer: Self-pay

## 2024-01-23 DIAGNOSIS — C189 Malignant neoplasm of colon, unspecified: Secondary | ICD-10-CM

## 2024-01-23 NOTE — Progress Notes (Signed)
 Order placed per Stephane Palin, NP

## 2024-01-24 ENCOUNTER — Inpatient Hospital Stay: Admitting: Nurse Practitioner

## 2024-01-24 ENCOUNTER — Inpatient Hospital Stay

## 2024-01-24 ENCOUNTER — Inpatient Hospital Stay: Attending: Oncology

## 2024-01-24 VITALS — BP 96/59 | HR 85 | Temp 97.8°F | Resp 18 | Ht 73.0 in | Wt 159.0 lb

## 2024-01-24 DIAGNOSIS — N186 End stage renal disease: Secondary | ICD-10-CM | POA: Insufficient documentation

## 2024-01-24 DIAGNOSIS — C186 Malignant neoplasm of descending colon: Secondary | ICD-10-CM | POA: Insufficient documentation

## 2024-01-24 DIAGNOSIS — D649 Anemia, unspecified: Secondary | ICD-10-CM | POA: Diagnosis not present

## 2024-01-24 DIAGNOSIS — C189 Malignant neoplasm of colon, unspecified: Secondary | ICD-10-CM | POA: Diagnosis not present

## 2024-01-24 DIAGNOSIS — R634 Abnormal weight loss: Secondary | ICD-10-CM | POA: Diagnosis not present

## 2024-01-24 DIAGNOSIS — I5032 Chronic diastolic (congestive) heart failure: Secondary | ICD-10-CM | POA: Diagnosis not present

## 2024-01-24 DIAGNOSIS — Z992 Dependence on renal dialysis: Secondary | ICD-10-CM | POA: Insufficient documentation

## 2024-01-24 DIAGNOSIS — R531 Weakness: Secondary | ICD-10-CM | POA: Insufficient documentation

## 2024-01-24 LAB — CBC WITH DIFFERENTIAL (CANCER CENTER ONLY)
Abs Immature Granulocytes: 0.08 K/uL — ABNORMAL HIGH (ref 0.00–0.07)
Basophils Absolute: 0.1 K/uL (ref 0.0–0.1)
Basophils Relative: 1 %
Eosinophils Absolute: 0.1 K/uL (ref 0.0–0.5)
Eosinophils Relative: 1 %
HCT: 23.4 % — ABNORMAL LOW (ref 39.0–52.0)
Hemoglobin: 7.7 g/dL — ABNORMAL LOW (ref 13.0–17.0)
Immature Granulocytes: 1 %
Lymphocytes Relative: 11 %
Lymphs Abs: 1.2 K/uL (ref 0.7–4.0)
MCH: 29.7 pg (ref 26.0–34.0)
MCHC: 32.9 g/dL (ref 30.0–36.0)
MCV: 90.3 fL (ref 80.0–100.0)
Monocytes Absolute: 0.8 K/uL (ref 0.1–1.0)
Monocytes Relative: 8 %
Neutro Abs: 8.5 K/uL — ABNORMAL HIGH (ref 1.7–7.7)
Neutrophils Relative %: 78 %
Platelet Count: 280 K/uL (ref 150–400)
RBC: 2.59 MIL/uL — ABNORMAL LOW (ref 4.22–5.81)
RDW: 16.4 % — ABNORMAL HIGH (ref 11.5–15.5)
WBC Count: 10.8 K/uL — ABNORMAL HIGH (ref 4.0–10.5)
nRBC: 0 % (ref 0.0–0.2)

## 2024-01-24 LAB — CMP (CANCER CENTER ONLY)
ALT: 20 U/L (ref 0–44)
AST: 44 U/L — ABNORMAL HIGH (ref 15–41)
Albumin: 2.5 g/dL — ABNORMAL LOW (ref 3.5–5.0)
Alkaline Phosphatase: 264 U/L — ABNORMAL HIGH (ref 38–126)
Anion gap: 9 (ref 5–15)
BUN: 10 mg/dL (ref 6–20)
CO2: 28 mmol/L (ref 22–32)
Calcium: 8.3 mg/dL — ABNORMAL LOW (ref 8.9–10.3)
Chloride: 92 mmol/L — ABNORMAL LOW (ref 98–111)
Creatinine: 3.38 mg/dL — ABNORMAL HIGH (ref 0.61–1.24)
GFR, Estimated: 20 mL/min — ABNORMAL LOW (ref >60)
Glucose, Bld: 231 mg/dL — ABNORMAL HIGH (ref 70–99)
Potassium: 3.2 mmol/L — ABNORMAL LOW (ref 3.5–5.1)
Sodium: 128 mmol/L — ABNORMAL LOW (ref 135–145)
Total Bilirubin: 1.1 mg/dL (ref 0.0–1.2)
Total Protein: 6.5 g/dL (ref 6.5–8.1)

## 2024-01-24 LAB — SAMPLE TO BLOOD BANK

## 2024-01-24 LAB — CEA (ACCESS): CEA (CHCC): 205.89 ng/mL — ABNORMAL HIGH (ref 0.00–5.00)

## 2024-01-24 NOTE — Progress Notes (Unsigned)
 [facility]   Telephone:([phone]) [phone] Fax:([phone]) [phone]    Patient Care Team: Clinic, Bonni Lien as PCP - General Raford Riggs, MD as PCP - Cardiology (Cardiology) Harden Jerona GAILS, MD as Consulting Physician (Orthopedic Surgery) Rollin Dover, MD as Consulting Physician (Gastroenterology) Gladis Cough, MD as Consulting Physician (General Surgery) Raford Riggs, MD as Attending Physician (Cardiology) Sheree Penne Bruckner, MD as Consulting Physician (Vascular Surgery) Cloretta Arley NOVAK, MD as Consulting Physician (Oncology)  Date of Service: 01/24/2024  CHIEF COMPLAINT: Metastatic colon cancer, hospital follow up  CURRENT THERAPY: Supportive care  INTERVAL HISTORY Jeffrey Young returns for hospital follow up, here with his wife. Not feeling well today, very weak. Required assistance to stand/ambulate yesterday, nearly fell. Eating some but not drinking much. Denies recurrent fever/chills. Not feeling urge for BM but wakes up with black sticky stools in diaper. Not taking oral iron since he left the hospital. No recurrent episodes of hematemesis.   ROS  All other systems reviewed and negative   Past Medical History:  Diagnosis Date   Arthritis    Hip   CHF (congestive heart failure) (HCC)    Colon cancer (HCC)    Diabetic mononeuropathy associated with type 2 diabetes mellitus (HCC) 03/21/2017   Diabetic retinopathy (HCC) 07/31/2020   Enthesopathy of ankle and tarsus 04/02/2009   Formatting of this note might be different from the original. 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 Type 2 Diabetes Mellitus - Uncomplicated, Uncontrolled     Past Surgical History:  Procedure Laterality Date   AMPUTATION Left 01/22/2018   Procedure: TRANSMETATARSAL AMPUTATION;  Surgeon: Harden Jerona GAILS, MD;  Location: [facility] OR;  Service: Orthopedics;  Laterality: Left;toes   BILIARY STENT PLACEMENT N/A 05/10/2023   Procedure: BILIARY STENT PLACEMENT;  Surgeon: Rollin Dover, MD;  Location: [facility] ENDOSCOPY;  Service: Gastroenterology;  Laterality: N/A;   BILIARY STENT PLACEMENT N/A 05/18/2023   Procedure: BILIARY STENT PLACEMENT;  Surgeon: Rollin Dover, MD;  Location: [facility];  Service: Gastroenterology;  Laterality: N/A;   BILIARY STENT PLACEMENT  01/02/2024   Procedure: INSERTION, STENT, BILE DUCT;  Surgeon: Rollin Dover, MD;  Location: [facility] ENDOSCOPY;  Service: Gastroenterology;;   BIOPSY  07/27/2020   Procedure: BIOPSY;  Surgeon: Rollin Dover, MD;  Location: [facility]OSCOPY;  Service: Endoscopy;;   COLON RESECTION N/A 07/30/2020   Procedure: HAND ASSISTED LAPAROSCOPIC LEFT HEMI COLECTOMY;  Surgeon: Gladis Cough, MD;  Location: [facility] ORS;  Service: General;  Laterality: N/A;   COLONOSCOPY WITH PROPOFOL  N/A 05/24/2018   Procedure: COLONOSCOPY WITH PROPOFOL ;  Surgeon: Rollin Dover, MD;  Location: [facility];  Service: Endoscopy;  Laterality: N/A;   COLONOSCOPY WITH PROPOFOL  N/A 07/27/2020   Procedure: COLONOSCOPY WITH PROPOFOL ;  Surgeon: Rollin Dover, MD;  Location: [facility];  Service: Endoscopy;  Laterality:  N/A;   DIALYSIS/PERMA CATHETER INSERTION N/A 11/19/2023   Procedure: DIALYSIS/PERMA CATHETER INSERTION;  Surgeon: [name] [name], MD;  Location:  [facility];  Service: Cardiovascular;  Laterality: N/A;   ERCP N/A 05/10/2023   Procedure: ENDOSCOPIC RETROGRADE CHOLANGIOPANCREATOGRAPHY (ERCP);  Surgeon: Rollin Dover, MD;  Location: [facility];  Service: Gastroenterology;  Laterality: N/A;   ERCP N/A 05/18/2023   Procedure: ENDOSCOPIC RETROGRADE CHOLANGIOPANCREATOGRAPHY (ERCP);  Surgeon: Rollin Dover, MD;  Location: [facility];  Service: Gastroenterology;  Laterality: N/A;   ERCP N/A 01/02/2024   Procedure: ERCP, WITH INTERVENTION IF INDICATED;  Surgeon: Rollin Dover, MD;  Location: [facility] ENDOSCOPY;  Service: Gastroenterology;  Laterality: N/A;   ESOPHAGOGASTRODUODENOSCOPY Left 04/05/2021   Procedure: ESOPHAGOGASTRODUODENOSCOPY (EGD);  Surgeon: Rollin Dover, MD;  Location: [facility];  Service: Endoscopy;  Laterality: Left;   ESOPHAGOGASTRODUODENOSCOPY N/A 05/10/2023   Procedure: ESOPHAGOGASTRODUODENOSCOPY (EGD);  Surgeon: Rollin Dover, MD;  Location: [facility];  Service: Gastroenterology;  Laterality: N/A;   ESOPHAGOGASTRODUODENOSCOPY N/A 01/15/2024   Procedure: EGD (ESOPHAGOGASTRODUODENOSCOPY);  Surgeon: Rollin Dover, MD;  Location: [facility] ENDOSCOPY;  Service: Gastroenterology;  Laterality: N/A;   EUS N/A 05/10/2023   Procedure: UPPER ENDOSCOPIC ULTRASOUND (EUS) LINEAR;  Surgeon: Rollin Dover, MD;  Location: [facility];  Service: Gastroenterology;  Laterality: N/A;   FINE NEEDLE ASPIRATION N/A 05/10/2023   Procedure: FINE NEEDLE ASPIRATION (FNA) LINEAR;  Surgeon: Rollin Dover, MD;  Location: [facility];  Service: Gastroenterology;  Laterality: N/A;   INSERTION OF ARTERIOVENOUS (AV) ARTEGRAFT ARM Right 12/06/2023   Procedure: INSERTION, GRAFT, ARTERIOVENOUS, UPPER EXTREMITY;  Surgeon: [name] [name], MD;  Location: [facility] OR;  Service: Vascular;  Laterality: Right;   INSERTION OF DIALYSIS CATHETER Right 12/06/2023   Procedure: EXCHANGE OF DIALYSIS CATHETER USING PALINDROME 23CM CATHETER KIT;  Surgeon: [name] [name], MD;  Location:  [facility] OR;  Service: Vascular;  Laterality: Right;   IR REMOVAL TUN CV CATH W/O FL  01/18/2024   POLYPECTOMY  05/24/2018   Procedure: POLYPECTOMY;  Surgeon: Rollin Dover, MD;  Location: [facility];  Service: Endoscopy;;   PORTACATH PLACEMENT Left 08/24/2020   Procedure: INSERTION PORT-A-CATH;  Surgeon: Gladis Cough, MD;  Location: [facility]S;  Service: General;  Laterality: Left;  75/rm1   SPHINCTEROTOMY  05/10/2023   Procedure: ANNETT;  Surgeon: Rollin Dover, MD;  Location: [facility];  Service: Gastroenterology;;   ANNETT  05/18/2023   Procedure: ANNETT;  Surgeon: Rollin Dover, MD;  Location: [facility];  Service: Gastroenterology;;   CLEDA REMOVAL  05/18/2023   Procedure: STENT REMOVAL;  Surgeon: Rollin Dover, MD;  Location: [facility];  Service: Gastroenterology;;   CLEDA REMOVAL  01/02/2024   Procedure: STENT REMOVAL;  Surgeon: Rollin Dover, MD;  Location: [facility] ENDOSCOPY;  Service: Gastroenterology;;   SUBMUCOSAL TATTOO INJECTION  07/27/2020   Procedure: SUBMUCOSAL TATTOO INJECTION;  Surgeon: Rollin Dover, MD;  Location: [facility];  Service: Endoscopy;;     Outpatient Encounter Medications as of 01/24/2024  Medication Sig Note   amLODipine  (NORVASC ) 10 MG tablet Take 10 mg by mouth daily.    cadexomer iodine  (IODOSORB) 0.9 % gel Apply 1 Application topically daily as needed for wound care.    carvedilol  (COREG ) 3.125 MG tablet Take 1 tablet (3.125 mg total) by mouth 2 (two) times daily with a meal.    ergocalciferol (VITAMIN D2) 1.25 MG (50000 UT) capsule Take 50,000 Units by mouth once a week. Take on Monday    ferrous sulfate 325 (65 FE) MG EC tablet Take 325 mg by mouth daily with  breakfast.    LANTUS  SOLOSTAR 100 UNIT/ML Solostar Pen Inject 10-15 Units into the skin See admin instructions. Inject 15 units into the skin in the morning and 10 units into the skin at bedtime    loperamide (IMODIUM) 2 MG capsule Take 2 mg by mouth daily as needed for diarrhea or  loose stools.    mirtazapine  (REMERON ) 15 MG tablet Take 7.5 mg by mouth at bedtime.    pantoprazole  (PROTONIX ) 40 MG tablet Take 1 tablet (40 mg total) by mouth 2 (two) times daily before a meal.    Sodium Chloride , GU Irrigant, (0.9 % SODIUM CHLORIDE , POUR BTL, OPTIME) Irrigate with 1,000 mLs as directed daily.    vancomycin  (VANCOREADY) 750 MG/150ML SOLN Inject 150 mLs (750 mg total) into the vein every Monday, Wednesday, and Friday with hemodialysis for 12 days.    baclofen  (LIORESAL ) 10 MG tablet Take 10 mg by mouth daily as needed for muscle spasms. (Patient not taking: Reported on 01/24/2024)    EPINEPHrine  0.3 mg/0.3 mL IJ SOAJ injection Inject 0.3 mg into the muscle as needed for anaphylaxis. (Patient not taking: Reported on 01/24/2024) 01/15/2024: For Emergency purposes    Nutritional Supplements (FEEDING SUPPLEMENT, NEPRO CARB STEADY,) LIQD Take 237 mLs by mouth daily. Mixed berry (Patient not taking: Reported on 01/24/2024)    Facility-Administered Encounter Medications as of 01/24/2024  Medication   sodium chloride  flush (NS) 0.9 % injection 10 mL     Today's Vitals   01/24/24 1232 01/24/24 1236 01/24/24 1257  BP: (!) 94/59 (!) 96/59   Pulse: 85    Resp: 18    Temp: 97.8 F (36.6 C)    SpO2: 100%    Weight: 159 lb (72.1 kg)    Height: 6' 1 (1.854 m)    PainSc:   0-No pain   Body mass index is 20.98 kg/m.   ECOG PERFORMANCE STATUS: 3 - Symptomatic, >50% confined to bed  PHYSICAL EXAM GENERAL: lethargic, drowsy, no distress and comfortable SKIN: no rash. Wound at the right trochanter is healing, mild serous drainage on dressing, no surrounding erythema  EYES: sclera clear LUNGS: distant breath sounds, normal breathing effort HEART: regular rate & rhythm, no lower extremity edema ABDOMEN: fullness at the RUQ. abdomen soft, non-tender and normal bowel sounds NEURO: awake, oriented x 3 with fluent speech, generalized weakness    CBC    Latest Ref Rng & Units 01/24/2024    12:23 PM 01/21/2024    6:07 AM 01/20/2024    7:57 AM  CBC  WBC 4.0 - 10.5 K/uL 10.8  9.0  11.3   Hemoglobin 13.0 - 17.0 g/dL 7.7  7.4  7.8   Hematocrit 39.0 - 52.0 % 23.4  22.2  23.6   Platelets 150 - 400 K/uL 280  206  211       CMP     Latest Ref Rng & Units 01/24/2024   12:23 PM 01/21/2024    6:07 AM 01/20/2024    7:57 AM  CMP  Glucose 70 - 99 mg/dL 768  812  814   BUN 6 - 20 mg/dL 10  29  25    Creatinine 0.61 - 1.24 mg/dL 6.61  3.97  5.35   Sodium 135 - 145 mmol/L 128  131  129   Potassium 3.5 - 5.1 mmol/L 3.2  3.6  3.6   Chloride 98 - 111 mmol/L 92  92  92   CO2 22 - 32 mmol/L 28  29  24  Calcium  8.9 - 10.3 mg/dL 8.3  8.1  8.1   Total Protein 6.5 - 8.1 g/dL 6.5  5.3  5.5   Total Bilirubin 0.0 - 1.2 mg/dL 1.1  1.6  1.6   Alkaline Phos 38 - 126 U/L 264  212  224   AST 15 - 41 U/L 44  25  22   ALT 0 - 44 U/L 20  17  17        ASSESSMENT & PLAN: 58 year old male    Descending colon cancer, stage IIIc (T3N2b M0), status post a partial left colectomy 07/30/2020, 9/16 lymph nodes positive, lymphovascular invasion, 1 satellite nodule, negative margins, MSS, no loss of mismatch repair protein expression; foundation 1 K-ras wild-type, NRAS Q61H, microsatellite stable, tumor mutation burden 4. -History of large polyp in the left side of the colon-referred to [facility] in 05/2018 for procedure canceled secondary to COVID-19 pandemic.  Procedure was not rescheduled. -CT chest/abdomen/pelvis with contrast 07/27/2020-3 small pulmonary nodules less than 5 mm favored to be benign, circumferential luminal narrowing of the distal transverse colon concerning for malignancy, no metastatic adenopathy in the mesentery porta hepatis, no for metastasis. -CEA on 07/27/2020 was 17.3; 37 on 08/30/2020; 33 on 09/13/2020 -Colonoscopy performed 07/27/2020 showed a fungating, infiltrative and ulcerated nonobstructing large mass in the proximal descending colon.  Biopsy-adenocarcinoma -Cycle 1 FOLFOX 08/30/2020 -Cycle 2 FOLFOX  09/13/2020, Emend  added for delayed nausea -Cycle 3 FOLFOX 09/27/2020 -Cycle 4 FOLFOX 10/11/2020 -Cycle 5 FOLFOX 11/08/2020 -Cycle 6 FOLFOX 11/23/2020 -CT 12/03/2020-prior 3 mm left apical nodule no longer seen, no new/suspicious pulmonary nodules, no evidence of metastatic disease -Cycle 7 FOLFOX 12/06/2020 -Cycle 8 FOLFOX 12/21/2020 -Cycle 9 FOLFOX 01/03/2021 -Cycle 10 FOLFOX 01/17/2021 -Cycle 11 FOLFOX 01/31/2021, oxaliplatin  held secondary to neuropathy symptoms -Cycle 12 FOLFOX 02/15/2021, oxaliplatin  held secondary to neuropathy -CT abdomen/pelvis 04/02/2021-no evidence of recurrent colon cancer -CT chest 04/08/2021-no evidence of metastatic disease -Elevated CEA February and March 2023 -08/17/2021 PET scan-no evidence of local recurrence or metastasis -09/26/2021-Guardant-ctDNA detected -Colonoscopy 10/04/2021-negative -CT 11/17/2021-no evidence of metastatic disease -CTs 01/08/2022-no evidence of metastatic disease -PET scan 08/10/2022-several newly enlarged lymph nodes with moderate metabolic activity -CT 11/06/2022-stable left supraclavicular and retroperitoneal nodes, mild progression of pelvic adenopathy, new 6 mm segment 2 liver lesion -CT 03/05/2023-increase size of left supraclavicular node and retroperitoneal nodes, increased size of gastropathic and left iliac side chain lymph nodes, stable subtle hypoattenuating lesions at segment 2 in the hepatic dome -CT abdomen/pelvis 05/07/2023-new intrahepatic and extrahepatic biliary duct dilatation with possible associated periportal edema.  Common duct dilated up to 15 mm diameter, abruptly terminates prior to entering the head of the pancreas.  Distended gallbladder with possible gallbladder wall thickening and pericholecystic edema.  No substantial change in lymphadenopathy of the gastrohepatic ligament, hepatoduodenal ligament and left external iliac chain. -MRI abdomen 05/08/2023-abrupt malignant appearing biliary stricture at the level of the middle  third of the common bile duct with adjacent porta hepatis adenopathy.  Moderate diffuse intrahepatic and proximal extrahepatic biliary duct dilatation.  Mild gallbladder distention with moderate diffuse gallbladder wall thickening.  Minimal pericholecystic fluid and fat stranding.  Solitary 1.1 cm anterior segment 2 left liver mass.  Multiple mildly enlarged porta hepatis and portacaval nodes. -Biopsy perihepatic lymph node 05/10/2023-malignant cells, adenocarcinoma -CTs 07/02/2023-persistent biliary duct dilatation with indwelling stent.  Persistent wall thickening of the gallbladder.  Subtle areas of nodal enlargement identified in the abdomen and pelvis, similar to recent prior examinations.  Enlarged node in the supraclavicular region appears  smaller.  No new thoracic nodes.  Mild areas of groundglass in the upper lobes of the lungs bilaterally, nonspecific. - CTs 10/13/2023: Interval growth of a periportal node, nonpathologically enlarged left supraclavicular retroperitoneal nodes, persistent mild intra Paddock biliary duct dilatation with common bile duct stents in place -CT abdomen/pelvis 01/01/2024: Vague 1.4 cm mass bulging the anterior contour of segment 3, intra Paddock and exophytic bile duct dilation-increased, distended gallbladder, mass anterior to the superior margin of the pancreas body, enlarged gastropathic ligament and porta hepatis nodes, small pleural effusions 2.  Anemia due to GI bleeding, iron deficiency?,  Renal insufficiency? 3.  New onset acute diastolic CHF March 2022 4.  Diabetes mellitus 5.  Renal failure-dialysis catheter placed 11/19/2023.  Dialysis scheduled to begin 11/21/2023 Monday Wednesday Friday schedule. 6.  Hypertension 7.  History of left transmetatarsal amputation 8.  History of colon polyps 9.  Neuropathy 10.  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  Admission  with obstructive jaundice 05/07/2023 - 05/22/2023.  Upper EUS 05/10/2023-lymph node visualized and measured in the porta hepatis region; FNA showed malignant cells, adenocarcinoma.  ERCP 05/10/2023-biliary tract obstruction secondary to a mass in the common bile duct, plastic stent placed into the common bile duct.  ERCP 05/18/2023-visibly patent stent from the common bile duct was seen in the major papilla.  Stent removed from the common bile duct.  2 plastic stents placed into the common bile duct. 12/31/2023 nausea/jaundice: ERCP 01/02/2024-occlusion of plastic bile duct stents-removed, an uncovered metal stent was placed, a single stricture was found in the common bile duct, clot coming from the bile duct 13.  Admission 12/31/2023 with hematemesis and severe anemia, EGD 01/02/2024: No evidence of a bleeding source in the upper GI tract 14.  Admission 01/14/2024 with severe anemia and hematemesis EGD 01/15/2024: Nonbleeding gastric ulcer with adherent clot, biopsy-adenocarcinoma 1 unit packed red blood cells 01/15/2024 15.  Fever-positive blood culture for staph epidermidis and second culture returned positive for a strep species. The source for fever/infection could be the hemodialysis catheter, Port-A-Cath, biliary stent, aspiration, left heel or hip ulcer, or tumor.  Currently on Vanc per ID, receives outpatient while at dialysis      Disposition  Jeffrey Young appears lethargic and profoundly weak with progressive weight loss. 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 He received a red cell transfusion while hospitalized. No recurrent hematemesis. He continues Vanc at dialysis for positive blood cultures; remains afebrile.   We reviewed his complex situation; unfortunately due to poor performance status, he is not a candidate for chemotherapy. We discussed systemic treatment in patients with ECOG 3 will likely be less effective and more toxic. We recommend  comfort care in the form of home hospice. Patient and his wife agree. We recommend DNR/DNI but he prefers to remain a full code for now.   VSS except soft BP, I reviewed his med list and recommend to stop norvasc . He should have already stopped losartan at hospital discharge.   Labs reviewed, Hgb 7.7. He would like a supportive red cell transfusion next week which we will arrange.   We will see him back for an office visit in 4 weeks, he knows to call sooner if needed.   Patient seen with Dr. Cloretta.   Orders Placed This Encounter  Procedures   Ambulatory referral to Hospice    Referral Priority:   Urgent  Referral Type:   Consultation    Referral Reason:   Specialty Services Required    Requested Specialty:   Hospice Services    Number of Visits Requested:   1     All questions were answered. The patient knows to call the clinic with any problems, questions or concerns. No barriers to learning were detected.   Jeffrey Lindroth K Stacia Feazell, NP 01/25/2024   This was a shared visit with Raiquan Chandler.  Jeffrey Young was interviewed and examined.  He continues antibiotics for treatment of the bacteremia noted during the recent hospital admission.  He has black tarry stool in the mornings, likely old blood from the bleeding gastric ulcer.  The hemoglobin is stable compared to he was discharged from the hospital.  Jeffrey Young has metastatic colon cancer.  He has a poor performance status, ECOG 3.  He does not appear to be a candidate for chemotherapy.  He agrees.  He agrees to a referral for home hospice care.  We discussed CPR and ACLS.  He would like to remain on a full CODE STATUS.  He will receive a red cell transfusion.  He plans to continue hemodialysis.  Jeffrey Young would like to return for an office visit in 4 weeks.  We are available to see him sooner as needed.  I was present for greater than 50% of today's visit.  I performed Medical Decision Making.  Arvella Hof, MD

## 2024-01-24 NOTE — Patient Instructions (Signed)

## 2024-01-25 ENCOUNTER — Telehealth: Payer: Self-pay | Admitting: Nurse Practitioner

## 2024-01-25 ENCOUNTER — Encounter: Payer: Self-pay | Admitting: Oncology

## 2024-01-25 NOTE — Telephone Encounter (Signed)
 Patient has been scheduled for follow-up visit per 01/24/24 LOS.  Pt noted appt details on personal electronic device.

## 2024-01-26 ENCOUNTER — Other Ambulatory Visit: Payer: Self-pay

## 2024-01-28 ENCOUNTER — Telehealth: Payer: Self-pay | Admitting: Nurse Practitioner

## 2024-01-28 ENCOUNTER — Inpatient Hospital Stay

## 2024-01-28 ENCOUNTER — Telehealth: Payer: Self-pay | Admitting: *Deleted

## 2024-01-28 NOTE — Telephone Encounter (Signed)
 Mrs. Vallance called to request his 9/8 lab/flush be moved to 01/29/24 since he will be in dialysis today till 4:30 pm. Scheduler notified via chat message.

## 2024-01-29 ENCOUNTER — Inpatient Hospital Stay

## 2024-01-29 ENCOUNTER — Other Ambulatory Visit: Payer: Self-pay | Admitting: *Deleted

## 2024-01-29 DIAGNOSIS — D649 Anemia, unspecified: Secondary | ICD-10-CM

## 2024-01-29 DIAGNOSIS — C189 Malignant neoplasm of colon, unspecified: Secondary | ICD-10-CM

## 2024-01-29 DIAGNOSIS — C186 Malignant neoplasm of descending colon: Secondary | ICD-10-CM | POA: Diagnosis not present

## 2024-01-29 LAB — CBC WITH DIFFERENTIAL (CANCER CENTER ONLY)
Abs Immature Granulocytes: 0.05 K/uL (ref 0.00–0.07)
Basophils Absolute: 0.1 K/uL (ref 0.0–0.1)
Basophils Relative: 1 %
Eosinophils Absolute: 0.1 K/uL (ref 0.0–0.5)
Eosinophils Relative: 1 %
HCT: 20.8 % — ABNORMAL LOW (ref 39.0–52.0)
Hemoglobin: 6.9 g/dL — CL (ref 13.0–17.0)
Immature Granulocytes: 1 %
Lymphocytes Relative: 13 %
Lymphs Abs: 1.3 K/uL (ref 0.7–4.0)
MCH: 29.5 pg (ref 26.0–34.0)
MCHC: 33.2 g/dL (ref 30.0–36.0)
MCV: 88.9 fL (ref 80.0–100.0)
Monocytes Absolute: 1 K/uL (ref 0.1–1.0)
Monocytes Relative: 10 %
Neutro Abs: 7.5 K/uL (ref 1.7–7.7)
Neutrophils Relative %: 74 %
Platelet Count: 252 K/uL (ref 150–400)
RBC: 2.34 MIL/uL — ABNORMAL LOW (ref 4.22–5.81)
RDW: 16.2 % — ABNORMAL HIGH (ref 11.5–15.5)
WBC Count: 10 K/uL (ref 4.0–10.5)
nRBC: 0 % (ref 0.0–0.2)

## 2024-01-29 LAB — SAMPLE TO BLOOD BANK

## 2024-01-29 LAB — PREPARE RBC (CROSSMATCH)

## 2024-01-29 NOTE — Patient Instructions (Signed)

## 2024-01-29 NOTE — Progress Notes (Signed)
 CRITICAL VALUE STICKER  CRITICAL VALUE: Hgb 6.9  RECEIVER (on-site recipient of call):Momodou Consiglio,RN  DATE & TIME NOTIFIED: 01/29/24 @ 1543  MESSENGER (representative from lab):James  MD NOTIFIED: Dr. Cloretta  TIME OF NOTIFICATION: 1600.  RESPONSE: Transfuse as scheduled on 01/31/24  Notified Mrs. Hochstetler of Hgb 6.9 and transfusion on 9/11 at 11 am. Keep blue band on. Transfusion orders placed and ticket faxed to blood bank. Will call [facility] on 9/10 for pick up at 10 am on 9/11

## 2024-01-31 ENCOUNTER — Inpatient Hospital Stay

## 2024-01-31 ENCOUNTER — Other Ambulatory Visit

## 2024-01-31 ENCOUNTER — Ambulatory Visit: Admitting: Oncology

## 2024-01-31 DIAGNOSIS — C186 Malignant neoplasm of descending colon: Secondary | ICD-10-CM | POA: Diagnosis not present

## 2024-01-31 DIAGNOSIS — D649 Anemia, unspecified: Secondary | ICD-10-CM

## 2024-01-31 MED ORDER — SODIUM CHLORIDE 0.9% IV SOLUTION
250.0000 mL | INTRAVENOUS | Status: DC
  Administered 2024-01-31: 250 mL via INTRAVENOUS

## 2024-01-31 NOTE — Patient Instructions (Signed)

## 2024-02-01 LAB — TYPE AND SCREEN
ABO/RH(D): A POS
Antibody Screen: NEGATIVE
Unit division: 0

## 2024-02-01 LAB — BPAM RBC
Blood Product Expiration Date: 202510102359
ISSUE DATE / TIME: 202509111005
Unit Type and Rh: 202510102359
Unit Type and Rh: 6200

## 2024-02-04 ENCOUNTER — Other Ambulatory Visit: Payer: Self-pay

## 2024-02-04 ENCOUNTER — Inpatient Hospital Stay (HOSPITAL_COMMUNITY)

## 2024-02-04 ENCOUNTER — Emergency Department (HOSPITAL_COMMUNITY)

## 2024-02-04 ENCOUNTER — Inpatient Hospital Stay (HOSPITAL_COMMUNITY)
Admission: EM | Admit: 2024-02-04 | Discharge: 2024-02-04 | DRG: 871 | Discharge status: Against Medical Advice | Attending: Internal Medicine | Admitting: Internal Medicine

## 2024-02-04 ENCOUNTER — Encounter (HOSPITAL_COMMUNITY): Payer: Self-pay | Admitting: Internal Medicine

## 2024-02-04 DIAGNOSIS — C799 Secondary malignant neoplasm of unspecified site: Secondary | ICD-10-CM | POA: Diagnosis present

## 2024-02-04 DIAGNOSIS — J90 Pleural effusion, not elsewhere classified: Secondary | ICD-10-CM

## 2024-02-04 DIAGNOSIS — Z515 Encounter for palliative care: Secondary | ICD-10-CM

## 2024-02-04 DIAGNOSIS — E785 Hyperlipidemia, unspecified: Secondary | ICD-10-CM | POA: Diagnosis present

## 2024-02-04 DIAGNOSIS — I152 Hypertension secondary to endocrine disorders: Secondary | ICD-10-CM | POA: Diagnosis present

## 2024-02-04 DIAGNOSIS — N186 End stage renal disease: Secondary | ICD-10-CM | POA: Diagnosis present

## 2024-02-04 DIAGNOSIS — Z5329 Procedure and treatment not carried out because of patient's decision for other reasons: Secondary | ICD-10-CM | POA: Diagnosis present

## 2024-02-04 DIAGNOSIS — Z66 Do not resuscitate: Secondary | ICD-10-CM | POA: Diagnosis present

## 2024-02-04 DIAGNOSIS — D500 Iron deficiency anemia secondary to blood loss (chronic): Secondary | ICD-10-CM | POA: Diagnosis present

## 2024-02-04 DIAGNOSIS — A419 Sepsis, unspecified organism: Principal | ICD-10-CM | POA: Diagnosis present

## 2024-02-04 DIAGNOSIS — Z91040 Latex allergy status: Secondary | ICD-10-CM

## 2024-02-04 DIAGNOSIS — E119 Type 2 diabetes mellitus without complications: Secondary | ICD-10-CM | POA: Diagnosis not present

## 2024-02-04 DIAGNOSIS — J9811 Atelectasis: Secondary | ICD-10-CM | POA: Diagnosis present

## 2024-02-04 DIAGNOSIS — J189 Pneumonia, unspecified organism: Secondary | ICD-10-CM | POA: Diagnosis present

## 2024-02-04 DIAGNOSIS — I5022 Chronic systolic (congestive) heart failure: Secondary | ICD-10-CM | POA: Diagnosis present

## 2024-02-04 DIAGNOSIS — Z794 Long term (current) use of insulin: Secondary | ICD-10-CM | POA: Diagnosis not present

## 2024-02-04 DIAGNOSIS — D509 Iron deficiency anemia, unspecified: Secondary | ICD-10-CM | POA: Diagnosis present

## 2024-02-04 DIAGNOSIS — Z8249 Family history of ischemic heart disease and other diseases of the circulatory system: Secondary | ICD-10-CM | POA: Diagnosis not present

## 2024-02-04 DIAGNOSIS — Z91030 Bee allergy status: Secondary | ICD-10-CM

## 2024-02-04 DIAGNOSIS — E1141 Type 2 diabetes mellitus with diabetic mononeuropathy: Secondary | ICD-10-CM | POA: Diagnosis present

## 2024-02-04 DIAGNOSIS — K750 Abscess of liver: Secondary | ICD-10-CM | POA: Diagnosis present

## 2024-02-04 DIAGNOSIS — Z79899 Other long term (current) drug therapy: Secondary | ICD-10-CM

## 2024-02-04 DIAGNOSIS — C185 Malignant neoplasm of splenic flexure: Secondary | ICD-10-CM | POA: Diagnosis present

## 2024-02-04 DIAGNOSIS — E1122 Type 2 diabetes mellitus with diabetic chronic kidney disease: Secondary | ICD-10-CM | POA: Diagnosis present

## 2024-02-04 DIAGNOSIS — R6520 Severe sepsis without septic shock: Secondary | ICD-10-CM

## 2024-02-04 DIAGNOSIS — C189 Malignant neoplasm of colon, unspecified: Secondary | ICD-10-CM | POA: Diagnosis not present

## 2024-02-04 DIAGNOSIS — E1159 Type 2 diabetes mellitus with other circulatory complications: Secondary | ICD-10-CM | POA: Diagnosis present

## 2024-02-04 DIAGNOSIS — Z992 Dependence on renal dialysis: Secondary | ICD-10-CM | POA: Diagnosis not present

## 2024-02-04 DIAGNOSIS — R4182 Altered mental status, unspecified: Secondary | ICD-10-CM

## 2024-02-04 DIAGNOSIS — Z1152 Encounter for screening for COVID-19: Secondary | ICD-10-CM | POA: Diagnosis not present

## 2024-02-04 DIAGNOSIS — E11319 Type 2 diabetes mellitus with unspecified diabetic retinopathy without macular edema: Secondary | ICD-10-CM | POA: Diagnosis present

## 2024-02-04 DIAGNOSIS — G9341 Metabolic encephalopathy: Secondary | ICD-10-CM

## 2024-02-04 DIAGNOSIS — E872 Acidosis, unspecified: Secondary | ICD-10-CM | POA: Diagnosis present

## 2024-02-04 DIAGNOSIS — Z9049 Acquired absence of other specified parts of digestive tract: Secondary | ICD-10-CM

## 2024-02-04 LAB — COMPREHENSIVE METABOLIC PANEL WITH GFR
ALT: 20 U/L (ref 0–44)
AST: 33 U/L (ref 15–41)
Albumin: 1.6 g/dL — ABNORMAL LOW (ref 3.5–5.0)
Alkaline Phosphatase: 137 U/L — ABNORMAL HIGH (ref 38–126)
Anion gap: 13 (ref 5–15)
BUN: 17 mg/dL (ref 6–20)
CO2: 28 mmol/L (ref 22–32)
Calcium: 7.8 mg/dL — ABNORMAL LOW (ref 8.9–10.3)
Chloride: 90 mmol/L — ABNORMAL LOW (ref 98–111)
Creatinine, Ser: 4.71 mg/dL — ABNORMAL HIGH (ref 0.61–1.24)
GFR, Estimated: 13 mL/min — ABNORMAL LOW (ref >60)
Glucose, Bld: 172 mg/dL — ABNORMAL HIGH (ref 70–99)
Potassium: 3.1 mmol/L — ABNORMAL LOW (ref 3.5–5.1)
Sodium: 131 mmol/L — ABNORMAL LOW (ref 135–145)
Total Bilirubin: 1.6 mg/dL — ABNORMAL HIGH (ref 0.0–1.2)
Total Protein: 5.1 g/dL — ABNORMAL LOW (ref 6.5–8.1)

## 2024-02-04 LAB — CBC WITH DIFFERENTIAL/PLATELET
Abs Immature Granulocytes: 0.11 K/uL — ABNORMAL HIGH (ref 0.00–0.07)
Basophils Absolute: 0 K/uL (ref 0.0–0.1)
Basophils Relative: 0 %
Eosinophils Absolute: 0 K/uL (ref 0.0–0.5)
Eosinophils Relative: 0 %
HCT: 22.1 % — ABNORMAL LOW (ref 39.0–52.0)
Hemoglobin: 7.3 g/dL — ABNORMAL LOW (ref 13.0–17.0)
Immature Granulocytes: 1 %
Lymphocytes Relative: 5 %
Lymphs Abs: 0.5 K/uL — ABNORMAL LOW (ref 0.7–4.0)
MCH: 30 pg (ref 26.0–34.0)
MCHC: 33 g/dL (ref 30.0–36.0)
MCV: 90.9 fL (ref 80.0–100.0)
Monocytes Absolute: 0.8 K/uL (ref 0.1–1.0)
Monocytes Relative: 7 %
Neutro Abs: 9.4 K/uL — ABNORMAL HIGH (ref 1.7–7.7)
Neutrophils Relative %: 87 %
Platelets: 151 K/uL (ref 150–400)
RBC: 2.43 MIL/uL — ABNORMAL LOW (ref 4.22–5.81)
RDW: 15.8 % — ABNORMAL HIGH (ref 11.5–15.5)
WBC: 10.8 K/uL — ABNORMAL HIGH (ref 4.0–10.5)
nRBC: 0 % (ref 0.0–0.2)

## 2024-02-04 LAB — I-STAT CG4 LACTIC ACID, ED: Lactic Acid, Venous: 2.9 mmol/L (ref 0.5–1.9)

## 2024-02-04 LAB — RESP PANEL BY RT-PCR (RSV, FLU A&B, COVID)  RVPGX2
Influenza A by PCR: NEGATIVE
Influenza B by PCR: NEGATIVE
Resp Syncytial Virus by PCR: NEGATIVE
SARS Coronavirus 2 by RT PCR: NEGATIVE

## 2024-02-04 LAB — PROTIME-INR
INR: 1.1 (ref 0.8–1.2)
Prothrombin Time: 14.6 s (ref 11.4–15.2)

## 2024-02-04 LAB — TROPONIN I (HIGH SENSITIVITY): Troponin I (High Sensitivity): 36 ng/L — ABNORMAL HIGH (ref <18)

## 2024-02-04 MED ORDER — INSULIN ASPART 100 UNIT/ML IJ SOLN: 0.0000 [IU] | Freq: Every day | INTRAMUSCULAR | Status: DC

## 2024-02-04 MED ORDER — INSULIN ASPART 100 UNIT/ML IJ SOLN: 0.0000 [IU] | Freq: Three times a day (TID) | INTRAMUSCULAR | Status: DC

## 2024-02-04 MED ORDER — HEPARIN SODIUM (PORCINE) 5000 UNIT/ML IJ SOLN: 5000.0000 [IU] | Freq: Three times a day (TID) | INTRAMUSCULAR | Status: DC

## 2024-02-04 MED ORDER — VANCOMYCIN HCL IN DEXTROSE 1-5 GM/200ML-% IV SOLN
1000.0000 mg | Freq: Once | INTRAVENOUS | Status: AC
  Administered 2024-02-04: 1000 mg via INTRAVENOUS
  Filled 2024-02-04: qty 200

## 2024-02-04 MED ORDER — ACETAMINOPHEN 650 MG RE SUPP: 650.0000 mg | Freq: Four times a day (QID) | RECTAL | Status: DC | PRN

## 2024-02-04 MED ORDER — LACTATED RINGERS IV BOLUS (SEPSIS)
250.0000 mL | Freq: Once | INTRAVENOUS | Status: AC
  Administered 2024-02-04: 250 mL via INTRAVENOUS

## 2024-02-04 MED ORDER — ACETAMINOPHEN 325 MG PO TABS: 650.0000 mg | ORAL_TABLET | Freq: Four times a day (QID) | ORAL | Status: DC | PRN

## 2024-02-04 MED ORDER — POLYETHYLENE GLYCOL 3350 17 G PO PACK: 17.0000 g | PACK | Freq: Every day | ORAL | Status: DC | PRN

## 2024-02-04 MED ORDER — SODIUM CHLORIDE 0.9 % IV SOLN: 1.0000 g | INTRAVENOUS | Status: DC

## 2024-02-04 MED ORDER — VANCOMYCIN HCL 500 MG/100ML IV SOLN
500.0000 mg | Freq: Once | INTRAVENOUS | Status: DC
  Filled 2024-02-04: qty 100

## 2024-02-04 MED ORDER — LACTATED RINGERS IV BOLUS (SEPSIS)
1000.0000 mL | Freq: Once | INTRAVENOUS | Status: AC
  Administered 2024-02-04: 1000 mL via INTRAVENOUS

## 2024-02-04 MED ORDER — SODIUM CHLORIDE 0.9% FLUSH: 3.0000 mL | Freq: Two times a day (BID) | INTRAVENOUS | Status: DC

## 2024-02-04 MED ORDER — SODIUM CHLORIDE 0.9 % IV SOLN
2.0000 g | Freq: Once | INTRAVENOUS | Status: AC
  Administered 2024-02-04: 2 g via INTRAVENOUS
  Filled 2024-02-04: qty 12.5

## 2024-02-04 MED ORDER — METRONIDAZOLE 500 MG/100ML IV SOLN
500.0000 mg | Freq: Two times a day (BID) | INTRAVENOUS | Status: DC
  Filled 2024-02-04: qty 100

## 2024-02-04 MED ORDER — ACETAMINOPHEN 325 MG PO TABS
650.0000 mg | ORAL_TABLET | Freq: Once | ORAL | Status: AC
  Administered 2024-02-04: 650 mg via ORAL
  Filled 2024-02-04: qty 2

## 2024-02-04 NOTE — ED Notes (Addendum)
 Pt is A&O x 4. Pt is does not wish to continue further treatment including dialysis, lab work and antibiotics. Pt also wishes to be listed as DNR. Pt excused EMT-P from the room to discuss the matter with family before making a final decision. Per pt request a note has been placed outside of the room door requesting privacy.

## 2024-02-04 NOTE — ED Notes (Addendum)
 Spoke with Sundil, MD regarding pt desire to discontinue all medical exams and intervention. After an extensive conversation with Sundil, MD, pt and wife, pt understands the outcome of discontinuing medication and still wishes to leave the facility AMA. Pt is A&O x4 with a GCS of 15. Family is aware of the pt decision and supports his right to decide. Pt was informed by Blyss Lugar, EMT-P informed pt and wife of the right to return to the emergency department at any time if the pt wishes to receive medical treatment. Pt have been informed of the necessary documentation to sign.

## 2024-02-04 NOTE — Progress Notes (Signed)
 [facility] [employer] liaison note     This patient is a current hospice patient with [facility]. Please see media tab for detailed hospice report.    Liaison will continue to follow for any discharge planning needs and to coordinate continuation of hospice care.    Please don't hesitate to call with any Hospice related questions or concerns.    Thank you for the opportunity to participate in this patient's care.  Greig Basket, BSN, RN [facility] Liaison [phone]

## 2024-02-04 NOTE — Progress Notes (Signed)
 Called by ER MD for routine provision of HD in ESRD patient. Presents with sepsis secondary to hepatic abscess. Seems that he dialyzes through [facility] [place] MWF. Did not have HD today as per H&P. Has received fluids for sepsis. No urgent indications for HD tonight per chart review--not hyperkalemic nor hypoxic. Routine consult to be done in AM, will decide on HD tomorrow vs Wednesday upon evaluation. Will obtain outpatient orders once outpatient HD unit is open. Please in the interim with any questions/concerns.  Ephriam Stank, MD [facility]

## 2024-02-04 NOTE — Progress Notes (Signed)
 Pharmacy Antibiotic Note  Jeffrey Young is a 58 y.o. male admitted on 02/04/2024 with sepsis.  Pharmacy has been consulted for vancomycin  and Cefepime  dosing.  Plan: Vancomycin  500 mg to complete loading dose Cefepime  1 Q24H starting 9/16  Follow up vanc schedule and nephro HD plan FU 9/15 BCx   Temp (24hrs), Avg:102.7 F (39.3 C), Min:102.7 F (39.3 C), Max:102.7 F (39.3 C)  Recent Labs  Lab 01/29/24 1525 02/04/24 1528 02/04/24 1550  WBC 10.0  --  10.8*  CREATININE  --   --  4.71*  LATICACIDVEN  --  2.9*  --     Estimated Creatinine Clearance: 17 mL/min (A) (by C-G formula based on SCr of 4.71 mg/dL (H)).    Allergies  Allergen Reactions   Bee Venom Anaphylaxis, Swelling and Other (See Comments)    Cold Sweats, also   Latex Rash and Dermatitis    Antimicrobials this admission: 9/15 Metronidazole  >> Current 9/15 Cefepime  >> Current 9/15 Vancomycin  >> Current  Dose adjustments this admission: None  Microbiology results: 9/15 BCx: In Progress   Thank you for allowing pharmacy to be a part of this patient's care.  Prentice DOROTHA Favors, PharmD PGY1 Health-System Pharmacy Administration and Leadership Resident [facility]  02/04/2024 6:34 PM

## 2024-02-04 NOTE — ED Notes (Signed)
 Main pharmacy called regarding Vancomycin .

## 2024-02-04 NOTE — H&P (Addendum)
 History and Physical   Jeffrey Young FMW:[id number] DOB: 31-Jul-1965 DOA: 02/04/2024  PCP: [facility]   Patient coming from: Home  Chief Complaint: AMS, Nausea, Fever  HPI: Jeffrey Young is a 58 y.o. male with medical history significant of metastatic colon cancer, anemia, ESRD on HD, hypertension, hyperlipidemia, diabetes, CHF presenting with fever, nausea, vomiting, altered mental status.  History obtained with assistance of chart review and family due to patient's altered mentation.  Patient was recently admitted 8/25-9/11 for GI bleed secondary to malignant gastric ulcer.  Also covered with empiric antibiotics for fever.  ID was consulted and ultimately patient was discharged with 2 weeks of vancomycin  with HD to end on 9/13.  Patient follow-up with oncology and due to his poor performance status he is not a candidate for chemotherapy.  Recommendation at oncology follow-up was for home hospice/comfort care as well as DNR/DNI.  Patient at that time wish to remain full code  Patient recently developed fever nausea vomiting and altered mental status.  Has become nonverbal and somnolent wife said this is similar to the last time he had sepsis.  Due to this has not gone to dialysis today.  Patient unable to participate in review of systems.  ED Course: Vital signs in the ED notable for fever to 1-2.7, blood pressure in the 130s-140 systolic, heart rate in the 90s-100s.  Lab workup included CMP with sodium 131 which is chronic, potassium 3.1, chloride 90, creatinine stable at 4.71, glucose 172, calcium  7.8, protein 5.1, albumin  1.6, alk phos 137, T. bili 1.6.  CBC with hemoglobin stable at 7.3, WBC 10.8.  PT and INR normal.  Lactic acid 2.9 with repeat pending.  Troponin 36 with repeat pending.  Rester panel for flu COVID RSV negative.  Urinalysis pending.  Blood cultures pending.  Chest x-ray showed pulmonary edema versus multifocal pneumonia.  CT head showed no acute  abnormality.  CT chest showed new 7.3 cm liver lesion concerning for liver abscess not suspicious for metastatic disease.  Also noted was large right and moderate left pleural effusions with left atelectasis favored over pneumonia.  There are also changes making it difficult to rule out a tumor at the porta hepatis.  Patient received 2.25 L IV fluid, vancomycin , cefepime , Tylenol  in the ED.  Review of Systems: Patient unable to participate in review of systems.  Past Medical History:  Diagnosis Date   Acute upper GI bleed 12/31/2023   Arthritis    Hip   Bacteremia 01/18/2024   CHF (congestive heart failure) (HCC)    Colon cancer (HCC)    Diabetic mononeuropathy associated with type 2 diabetes mellitus (HCC) 03/21/2017   Diabetic retinopathy (HCC) 07/31/2020   Enthesopathy of ankle and tarsus 04/02/2009   Formatting of this note might be different from the original. Metatarsalgia  10/1 IMO update   Erectile dysfunction associated with type 2 diabetes mellitus (HCC) 05/08/2019   ESRD on hemodialysis (HCC)    HD on M,W,F   GI bleed 04/02/2021   Hyperlipidemia 07/31/2020   Hypertension associated with diabetes (HCC) 06/07/2019   Microalbuminuria due to type 2 diabetes mellitus (HCC) 03/21/2017   Necrotizing fasciitis of ankle and foot (HCC) 01/22/2018   Necrotizing soft tissue infection    Sepsis (HCC) 01/22/2018   Severe sepsis (HCC) 04/02/2021   Status post transmetatarsal amputation of left foot (HCC) 01/22/2018   Systolic heart failure (HCC) 07/31/2020   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 Type 2 Diabetes Mellitus - Uncomplicated, Uncontrolled    Past Surgical History:  Procedure Laterality Date   AMPUTATION Left 01/22/2018   Procedure: TRANSMETATARSAL AMPUTATION;  Surgeon: Harden Jerona GAILS, MD;  Location: [facility] OR;  Service: Orthopedics;  Laterality: Left;toes   BILIARY STENT PLACEMENT N/A 05/10/2023   Procedure: BILIARY STENT PLACEMENT;  Surgeon: Rollin Dover, MD;  Location: [facility] ENDOSCOPY;  Service: Gastroenterology;  Laterality: N/A;   BILIARY STENT PLACEMENT N/A 05/18/2023   Procedure: BILIARY STENT PLACEMENT;  Surgeon: Rollin Dover, MD;  Location: [facility];  Service: Gastroenterology;  Laterality: N/A;   BILIARY STENT PLACEMENT  01/02/2024   Procedure: INSERTION, STENT, BILE DUCT;  Surgeon: Rollin Dover, MD;  Location: [facility] ENDOSCOPY;  Service: Gastroenterology;;   BIOPSY  07/27/2020   Procedure: BIOPSY;  Surgeon: Rollin Dover, MD;  Location: [facility];  Service: Endoscopy;;   COLON RESECTION N/A 07/30/2020   Procedure: HAND ASSISTED LAPAROSCOPIC LEFT HEMI COLECTOMY;  Surgeon: Gladis Cough, MD;  Location: [facility] ORS;  Service: General;  Laterality: N/A;   COLONOSCOPY WITH PROPOFOL  N/A 05/24/2018   Procedure: COLONOSCOPY WITH PROPOFOL ;  Surgeon: Rollin Dover, MD;  Location: [facility];  Service: Endoscopy;  Laterality: N/A;   COLONOSCOPY WITH PROPOFOL  N/A 07/27/2020   Procedure: COLONOSCOPY WITH PROPOFOL ;  Surgeon: Rollin Dover, MD;  Location: [facility];  Service: Endoscopy;  Laterality: N/A;   DIALYSIS/PERMA CATHETER INSERTION N/A 11/19/2023   Procedure: DIALYSIS/PERMA CATHETER INSERTION;  Surgeon: [name] [name], MD;  Location: [facility];  Service: Cardiovascular;  Laterality: N/A;   ERCP N/A 05/10/2023   Procedure: ENDOSCOPIC RETROGRADE CHOLANGIOPANCREATOGRAPHY (ERCP);  Surgeon: Rollin Dover, MD;  Location: [facility];  Service: Gastroenterology;  Laterality: N/A;   ERCP N/A 05/18/2023   Procedure: ENDOSCOPIC RETROGRADE CHOLANGIOPANCREATOGRAPHY (ERCP);  Surgeon: Rollin Dover, MD;  Location: [facility];  Service: Gastroenterology;  Laterality: N/A;   ERCP N/A 01/02/2024   Procedure: ERCP, WITH INTERVENTION IF INDICATED;  Surgeon: Rollin Dover, MD;   Location: [facility] ENDOSCOPY;  Service: Gastroenterology;  Laterality: N/A;   ESOPHAGOGASTRODUODENOSCOPY Left 04/05/2021   Procedure: ESOPHAGOGASTRODUODENOSCOPY (EGD);  Surgeon: Rollin Dover, MD;  Location: [facility];  Service: Endoscopy;  Laterality: Left;   ESOPHAGOGASTRODUODENOSCOPY N/A 05/10/2023   Procedure: ESOPHAGOGASTRODUODENOSCOPY (EGD);  Surgeon: Rollin Dover, MD;  Location: [facility];  Service: Gastroenterology;  Laterality: N/A;   ESOPHAGOGASTRODUODENOSCOPY N/A 01/15/2024   Procedure: EGD (ESOPHAGOGASTRODUODENOSCOPY);  Surgeon: Rollin Dover, MD;  Location: [facility] ENDOSCOPY;  Service: Gastroenterology;  Laterality: N/A;   EUS N/A 05/10/2023   Procedure: UPPER ENDOSCOPIC ULTRASOUND (EUS) LINEAR;  Surgeon: Rollin Dover, MD;  Location: [facility];  Service: Gastroenterology;  Laterality: N/A;   FINE NEEDLE ASPIRATION N/A 05/10/2023   Procedure: FINE NEEDLE ASPIRATION (FNA) LINEAR;  Surgeon: Rollin Dover, MD;  Location: [facility];  Service: Gastroenterology;  Laterality: N/A;   INSERTION OF ARTERIOVENOUS (AV) ARTEGRAFT ARM Right 12/06/2023   Procedure: INSERTION, GRAFT, ARTERIOVENOUS, UPPER EXTREMITY;  Surgeon: [name] [name], MD;  Location: [facility] OR;  Service: Vascular;  Laterality: Right;   INSERTION OF DIALYSIS CATHETER Right 12/06/2023   Procedure: EXCHANGE OF DIALYSIS CATHETER USING PALINDROME 23CM CATHETER KIT;  Surgeon: [name] [name], MD;  Location: [facility] OR;  Service: Vascular;  Laterality: Right;   IR REMOVAL TUN CV CATH W/O FL  01/18/2024   POLYPECTOMY  05/24/2018   Procedure: POLYPECTOMY;  Surgeon: Rollin Dover, MD;  Location: [facility];  Service:  Endoscopy;;   PORTACATH PLACEMENT Left 08/24/2020   Procedure: INSERTION PORT-A-CATH;  Surgeon: Gladis Cough, MD;  Location: [facility]S;  Service: General;  Laterality: Left;  75/rm1   SPHINCTEROTOMY  05/10/2023   Procedure: ANNETT;  Surgeon: Rollin Dover, MD;  Location: [facility];  Service: Gastroenterology;;    ANNETT  05/18/2023   Procedure: ANNETT;  Surgeon: Rollin Dover, MD;  Location: [facility];  Service: Gastroenterology;;   CLEDA REMOVAL  05/18/2023   Procedure: STENT REMOVAL;  Surgeon: Rollin Dover, MD;  Location: [facility];  Service: Gastroenterology;;   CLEDA REMOVAL  01/02/2024   Procedure: STENT REMOVAL;  Surgeon: Rollin Dover, MD;  Location[facility] ENDOSCOPY;  Service: Gastroenterology;;   SUBMUCOSAL TATTOO INJECTION  07/27/2020   Procedure: SUBMUCOSAL TATTOO INJECTION;  Surgeon: Rollin Dover, MD;  Location: [facility] END[facility]COPY;  Service: Endoscopy;;    Social History  reports that he has never smoked. He has never used smokeless tobacco. He reports current alcohol use of about 1.0 standard drink of alcohol per week. He reports that he does not use drugs.  Allergies  Allergen Reactions   Bee Venom Anaphylaxis, Swelling and Other (See Comments)    Cold Sweats, also   Latex Rash and Dermatitis    Family History  Problem Relation Age of Onset   Hypertension Father   Reviewed on admission  Prior to Admission medications   Medication Sig Start Date End Date Taking? Authorizing Provider  amLODipine  (NORVASC ) 10 MG tablet Take 10 mg by mouth daily.   Yes [provider]  baclofen  (LIORESAL ) 10 MG tablet Take 10 mg by mouth daily as needed for muscle spasms.   Yes [provider]  cadexomer iodine  (IODOSORB) 0.9 % gel Apply 1 Application topically daily as needed for wound care.   Yes [provider]  carvedilol  (COREG ) 3.125 MG tablet Take 1 tablet (3.125 mg total) by mouth 2 (two) times daily with a meal. 01/03/24  Yes Dennise Lavada POUR, MD  EPINEPHrine  0.3 mg/0.3 mL IJ SOAJ injection Inject 0.3 mg into the muscle as needed for anaphylaxis.   Yes [provider]  ergocalciferol (VITAMIN D2) 1.25 MG (50000 UT) capsule Take 50,000 Units by mouth once a week. Take on Monday   Yes [provider]  ferrous sulfate 325 (65 FE) MG EC  tablet Take 325 mg by mouth daily with breakfast.   Yes [provider]  LANTUS  SOLOSTAR 100 UNIT/ML Solostar Pen Inject 10-15 Units into the skin See admin instructions. Inject 15 units into the skin in the morning and 10 units into the skin at bedtime   Yes [provider]  loperamide (IMODIUM) 2 MG capsule Take 2 mg by mouth daily as needed for diarrhea or loose stools.   Yes [provider]  losartan (COZAAR) 50 MG tablet Take 50 mg by mouth daily.   Yes [provider]  mirtazapine  (REMERON ) 15 MG tablet Take 7.5 mg by mouth at bedtime. 12/26/23  Yes [provider]  pantoprazole  (PROTONIX ) 40 MG tablet Take 1 tablet (40 mg total) by mouth 2 (two) times daily before a meal. Patient taking differently: Take 40 mg by mouth 2 (two) times daily as needed. 01/21/24  Yes Singh, Prashant K, MD  Nutritional Supplements (FEEDING SUPPLEMENT, NEPRO CARB STEADY,) LIQD Take 237 mLs by mouth daily. Mixed berry Patient not taking: Reported on 02/04/2024    [provider]  Sodium Chloride , GU Irrigant, (0.9 % SODIUM CHLORIDE , POUR BTL, OPTIME) Irrigate with 1,000 mLs  as directed daily.    [provider]    Physical Exam: Vitals:   02/04/24 1418 02/04/24 1430 02/04/24 1530 02/04/24 1630  BP: 136/71  139/73 (!) 144/68  Pulse: (!) 105  92 96  Resp: (!) 23  (!) 24 (!) 26  Temp: (!) 102.7 F (39.3 C)     TempSrc: Oral     SpO2: 97% 96% 94% (!) 87%    Physical Exam Constitutional:      General: He is not in acute distress.    Appearance: Normal appearance. He is ill-appearing.  HENT:     Head: Normocephalic and atraumatic.     Mouth/Throat:     Mouth: Mucous membranes are moist.     Pharynx: Oropharynx is clear.  Eyes:     Extraocular Movements: Extraocular movements intact.     Pupils: Pupils are equal, round, and reactive to light.  Cardiovascular:     Rate and Rhythm: Normal rate and regular rhythm.     Pulses: Normal pulses.      Heart sounds: Normal heart sounds.  Pulmonary:     Effort: Pulmonary effort is normal. No respiratory distress.     Breath sounds: Normal breath sounds.  Abdominal:     General: Bowel sounds are normal. There is no distension.     Palpations: Abdomen is soft.     Tenderness: There is no abdominal tenderness.  Musculoskeletal:        General: No swelling or deformity.  Skin:    General: Skin is warm and dry.  Neurological:     Comments: Alert but not responding to questions.  Looks around the room and moves all 4 extremities spontaneously.    Labs on Admission: I have personally reviewed following labs and imaging studies  CBC: Recent Labs  Lab 01/29/24 1525 02/04/24 1550  WBC 10.0 10.8*  NEUTROABS 7.5 9.4*  HGB 6.9* 7.3*  HCT 20.8* 22.1*  MCV 88.9 90.9  PLT 252 151    Basic Metabolic Panel: Recent Labs  Lab 02/04/24 1550  NA 131*  K 3.1*  CL 90*  CO2 28  GLUCOSE 172*  BUN 17  CREATININE 4.71*  CALCIUM  7.8*    GFR: Estimated Creatinine Clearance: 17 mL/min (A) (by C-G formula based on SCr of 4.71 mg/dL (H)).  Liver Function Tests: Recent Labs  Lab 02/04/24 1550  AST 33  ALT 20  ALKPHOS 137*  BILITOT 1.6*  PROT 5.1*  ALBUMIN  1.6*    Urine analysis:    Component Value Date/Time   COLORURINE AMBER (A) 05/20/2023 1728   APPEARANCEUR CLEAR 05/20/2023 1728   LABSPEC 1.011 05/20/2023 1728   PHURINE 5.0 05/20/2023 1728   GLUCOSEU 150 (A) 05/20/2023 1728   HGBUR SMALL (A) 05/20/2023 1728   BILIRUBINUR NEGATIVE 05/20/2023 1728   KETONESUR NEGATIVE 05/20/2023 1728   PROTEINUR >=300 (A) 05/20/2023 1728   NITRITE NEGATIVE 05/20/2023 1728   LEUKOCYTESUR NEGATIVE 05/20/2023 1728    Radiological Exams on Admission: CT Chest Wo Contrast Result Date: 02/04/2024 CLINICAL DATA:  Colon cancer.  Pneumonia possible sepsis. EXAM: CT CHEST WITHOUT CONTRAST TECHNIQUE: Multidetector CT imaging of the chest was performed following the standard protocol without IV  contrast. RADIATION DOSE REDUCTION: This exam was performed according to the departmental dose-optimization program which includes automated exposure control, adjustment of the mA and/or kV according to patient size and/or use of iterative reconstruction technique. COMPARISON:  01/01/2024 FINDINGS: Cardiovascular: Left Port-A-Cath tip: Right atrium. Trace pericardial effusion. Mediastinum/Nodes: Unremarkable Lungs/Pleura: Large  right moderate left pleural effusion with passive atelectasis. Hazy density along the margins of the pleural effusions in both lower lobes probably from atelectasis, pneumonia is a less likely differential diagnostic consideration. Upper Abdomen: New 7.3 cm hypodense lesion centered in segment 4 of the liver, cannot exclude abscess. This seems unlikely to be a malignant lesion given that it was not present on 01/01/2024. Pneumobilia is present.  Indistinct porta hepatis. Musculoskeletal: Unremarkable IMPRESSION: 1. New 7.3 cm hypodense lesion centered in segment 4 of the liver, cannot exclude abscess. This seems unlikely to be a malignant lesion given that it was not present on 01/01/2024. 2. Large right and moderate left pleural effusions with passive atelectasis. Hazy density along the margins of the pleural effusions in both lower lobes probably from atelectasis, pneumonia is a less likely differential diagnostic consideration. 3. Indistinctness of the porta hepatis, as on prior exams, tumor in this vicinity not excluded. 4. Trace pericardial effusion. 5. Pneumobilia. Electronically Signed   By: Ryan Salvage M.D.   On: 02/04/2024 16:45   CT Head Wo Contrast Result Date: 02/04/2024 CLINICAL DATA:  Altered mental status. EXAM: CT HEAD WITHOUT CONTRAST TECHNIQUE: Contiguous axial images were obtained from the base of the skull through the vertex without intravenous contrast. RADIATION DOSE REDUCTION: This exam was performed according to the departmental dose-optimization program which  includes automated exposure control, adjustment of the mA and/or kV according to patient size and/or use of iterative reconstruction technique. COMPARISON:  Head CT 04/02/2021 FINDINGS: Brain: There is no evidence of an acute infarct, intracranial hemorrhage, mass, midline shift, or extra-axial fluid collection. There is mild generalized cerebral atrophy. Vascular: No hyperdense vessel. Skull: No acute fracture or suspicious lesion. Sinuses/Orbits: Visualized paranasal sinuses and mastoid air cells are clear. No acute abnormality in the included portions of the orbits. Other: None. IMPRESSION: No evidence of acute intracranial abnormality. Electronically Signed   By: Dasie Hamburg M.D.   On: 02/04/2024 16:19   DG Chest Port 1 View Result Date: 02/04/2024 CLINICAL DATA:  Questionable sepsis - evaluate for abnormality EXAM: PORTABLE CHEST - 1 VIEW COMPARISON:  01/16/2024 FINDINGS: Bilateral perihilar interstitial opacities. Small right pleural effusion with right basilar airspace opacities. Patchy opacities in the left lung base. No pneumothorax. Mild cardiomegaly. Left chest port in place terminating in the high right atrium, unchanged. No acute fracture or destructive lesions. Multilevel thoracic osteophytosis. IMPRESSION: 1. Findings consistent with either pulmonary edema or multifocal pneumonia in the lung bases. 2. Small right pleural effusion. Electronically Signed   By: Rogelia Myers M.D.   On: 02/04/2024 15:25   EKG: Independently reviewed. Sinus rhythm at 98 beats per minute.  Nonspecific T wave changes.  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  Similar to prior episode of sepsis. > Found to be febrile to 1-2.7.  Leukocytosis to 10.8.  Intermittent tachycardia.  Meeting sepsis criteria. > Primary source identified is suspected hepatic abscess measuring 7.3 cm. > Chest x-ray shows right greater than left pleural effusions with associated atelectasis favored over pneumonia. > Started on vancomycin  and cefepime .  Had been  on vancomycin  outpatient due to stop 9/13 after patient was having issues with febrile illness during previous admission. > Also received 2.25 L IV fluids. - Monitor on progressive unit overnight - Continue with vancomycin  and cefepime , add Flagyl  for now - Hold off on further IV fluids given patient is ESRD and BP is okay. - Trend lactic acid - Trend fever curve and WBC - Follow-up blood cultures, urinalysis - CT abdomen pelvis as the liver was incompletely imaged  Pleural effusions > Regula pleural effusions.  If persistent despite dialysis while here may need thoracentesis. - Continue to monitor for now  Hypertension - Hold off on antihypertensives given developing sepsis, can be reevaluated tomorrow  ESRD on HD > Missed dialysis session today.  Labs with no indication for urgent dialysis. > EDP is consulting nephrology for evaluation/dialysis tomorrow, did receive 2.25 L IV fluids today. - Appreciate nephrology recommendations and assistance  Diabetes > 10 to 15 units long-acting insulin  twice daily at home. - SSI  Metastatic colon cancer > Ongoing issue with metastatic colon cancer.  Did previously have colectomy. > Recent admission due to GI bleed secondary to malignant gastric ulcer. > Follow-up with oncology after recent admission and as above patient is not a candidate for chemotherapy.  Their recommendation at this time is for home hospice/comfort care/DNR > Patient currently wished to remain full code at  that follow-up visit and is receiving palliative transfusions. - Continue to follow-up outpatient with oncology - Ongoing goals of care discussions with patient and family - Continues to spit up some blood, no chemoprophylaxis  Anemia - Hemoglobin stable at 7.3 - Trend CBC  CHF > Last echo was in 2022 with EF 60-65%, G1 DD, normal RV function. - Volume managed with HD  DVT prophylaxis: SCDs Code Status:   Full Family Communication:  Updated at bedside  Disposition Plan:   Patient is from:  Home  Anticipated DC to:  Pending clinical course  Anticipated DC date:  2 to 7 days  Anticipated DC barriers: None  Consults called:  None Admission status:  Inpatient, progressive  Severity of Illness: The appropriate patient status for this patient is INPATIENT. Inpatient status is judged to be reasonable and necessary in order to provide the required intensity of service to ensure the patient's safety. The patient's presenting symptoms, physical exam findings, and initial radiographic and laboratory data in the context of their chronic comorbidities is felt to place them at high risk for further clinical deterioration. Furthermore, it is not anticipated that the patient will be medically stable for discharge from the hospital within 2 midnights of admission.   * I certify that at the point of admission it is my clinical judgment that the patient will require inpatient hospital care spanning beyond 2 midnights from the point of admission due to high intensity of service, high risk for further deterioration and high frequency of surveillance required.DEWAINE Marsa KATHEE Seena MD [facility]  How to contact the [facility] Attending or Consulting provider 7A - 7P or covering provider during after hours 7P -7A, for this patient?   Check the care team in [facility] and look for a) attending/consulting [facility] provider listed and b) the [facility] team listed Log into [email or web address] and use [facility]'s universal  password to access. If you do not have the password, please contact the hospital operator. Locate the [facility] provider you are looking for under [facility] and page to a number that you can be directly reached. If  you still have difficulty reaching the provider, please page the [facility] (Director on Call) for the Hospitalists listed on amion for assistance.  02/04/2024, 6:01 PM

## 2024-02-04 NOTE — ED Notes (Signed)
 Pt arrives via [facility] with CC of fever, AMS, lethargic, altered - Came from dialysis and did not get his treatment today   Couple episodes of vomiting with EMS which they state was dark in color   Hx colon cancer  Tachy with HR 107, Spo2 100 on RA, 130/70 BP, RR 20, temp with EMS 102.6 oral  CBG  171  No Ivs  645 mg oral tylenol  given with EMS

## 2024-02-04 NOTE — ED Notes (Signed)
 Unable to pull enough blood for labs. Called and visited mini lab for assistance with no success.

## 2024-02-04 NOTE — ED Notes (Addendum)
 Pt and wife provided with AVS. AMA form was unable to be printer but wife was informed that it was available in MyChart.

## 2024-02-04 NOTE — ED Provider Notes (Signed)
  Physical Exam  BP 139/73   Pulse 92   Temp (!) 102.7 F (39.3 C) (Oral)   Resp (!) 24   SpO2 94%   Physical Exam  Procedures  .Critical Care  Performed by: Jerrol Agent, MD Authorized by: Jerrol Agent, MD   Critical care provider statement:    Critical care time (minutes):  30   Critical care was necessary to treat or prevent imminent or life-threatening deterioration of the following conditions:  Sepsis   Critical care was time spent personally by me on the following activities:  Development of treatment plan with patient or surrogate, discussions with consultants, evaluation of patient's response to treatment, examination of patient, ordering and review of laboratory studies, ordering and review of radiographic studies, ordering and performing treatments and interventions, pulse oximetry, re-evaluation of patient's condition and review of old charts   Care discussed with: admitting provider     ED Course / MDM    Medical Decision Making Amount and/or Complexity of Data Reviewed Labs: ordered. Radiology: ordered.  Risk OTC drugs. Prescription drug management. Decision regarding hospitalization.   71M presenting with change in mental status, likely septic. Lactic 2.9 and fever, getting ABX and fluids, multifocal PNA,  waiting on labs, likely admit.    Labs reveal lactic acidosis 2.9, cardiac troponin 36, CBC with a leukocytosis to 10.8, anemia to 7.3, CMP with a creatinine of 0.71, potassium 3.1.  Patient covered with broad-spectrum antibiotics, CT head was unremarkable, CT chest: IMPRESSION:  1. New 7.3 cm hypodense lesion centered in segment 4 of the liver,  cannot exclude abscess. This seems unlikely to be a malignant lesion  given that it was not present on 01/01/2024.  2. Large right and moderate left pleural effusions with passive  atelectasis. Hazy density along the margins of the pleural effusions  in both lower lobes probably from atelectasis, pneumonia is a  less  likely differential diagnostic consideration.  3. Indistinctness of the porta hepatis, as on prior exams, tumor in  this vicinity not excluded.  4. Trace pericardial effusion.  5. Pneumobilia.   Hospitalist medicine consulted for admission, Dr. Seena accepting, patient subsequently admitted in stable condition.   Jerrol Agent, MD 02/04/24 2224

## 2024-02-04 NOTE — ED Provider Notes (Signed)
 [place] EMERGENCY DEPARTMENT AT [facility] Provider Note   CSN: [id number] Arrival date & time: 02/04/24  1357     Patient presents with: Fever, Nausea, and Vomiting   Jeffrey Young is a 58 y.o. male.   89-year-old male brought in by EMS.  Is a dialysis patient but did not receive dialysis today.  Patient is nonverbal today and further history is limited at this time.  We do not know his baseline mental status.  He tracks with eyes but does not answer his questions appropriately noted to be febrile.        Prior to Admission medications   Medication Sig Start Date End Date Taking? Authorizing Provider  baclofen  (LIORESAL ) 10 MG tablet Take 10 mg by mouth daily as needed for muscle spasms. Patient not taking: Reported on 01/24/2024    [provider]  cadexomer iodine  (IODOSORB) 0.9 % gel Apply 1 Application topically daily as needed for wound care.    [provider]  carvedilol  (COREG ) 3.125 MG tablet Take 1 tablet (3.125 mg total) by mouth 2 (two) times daily with a meal. 01/03/24   Dennise Lavada POUR, MD  EPINEPHrine  0.3 mg/0.3 mL IJ SOAJ injection Inject 0.3 mg into the muscle as needed for anaphylaxis. Patient not taking: Reported on 01/24/2024    [provider]  ergocalciferol (VITAMIN D2) 1.25 MG (50000 UT) capsule Take 50,000 Units by mouth once a week. Take on Monday    [provider]  ferrous sulfate 325 (65 FE) MG EC tablet Take 325 mg by mouth daily with breakfast.    [provider]  LANTUS  SOLOSTAR 100 UNIT/ML Solostar Pen Inject 10-15 Units into the skin See admin instructions. Inject 15 units into the skin in the morning and 10 units into the skin at bedtime    [provider]  loperamide (IMODIUM) 2 MG capsule Take 2 mg by mouth daily as needed for diarrhea or loose stools.    [provider]  mirtazapine  (REMERON ) 15 MG tablet Take 7.5 mg by mouth at bedtime. 12/26/23   [provider]   Nutritional Supplements (FEEDING SUPPLEMENT, NEPRO CARB STEADY,) LIQD Take 237 mLs by mouth daily. Mixed berry Patient not taking: Reported on 01/24/2024    [provider]  pantoprazole  (PROTONIX ) 40 MG tablet Take 1 tablet (40 mg total) by mouth 2 (two) times daily before a meal. 01/21/24   Dennise Lavada POUR, MD  Sodium Chloride , GU Irrigant, (0.9 % SODIUM CHLORIDE , POUR BTL, OPTIME) Irrigate with 1,000 mLs as directed daily.    [provider]    Allergies: Bee venom and Latex    Review of Systems  Reason unable to perform ROS: Patient nonverbal, altered mental status.    Updated Vital Signs BP 139/73   Pulse 92   Temp (!) 102.7 F (39.3 C) (Oral)   Resp (!) 24   SpO2 94%   Physical Exam Vitals and nursing note reviewed.  Constitutional:      General: He is not in acute distress.    Appearance: He is well-developed. He is ill-appearing.  HENT:     Head: Normocephalic and atraumatic.  Eyes:     Conjunctiva/sclera: Conjunctivae normal.     Comments: Pupils are small/pinpoint  Cardiovascular:     Rate and Rhythm: Regular rhythm. Tachycardia present.     Heart sounds: No murmur heard. Pulmonary:     Effort: Pulmonary effort is normal. No respiratory distress.     Breath  sounds: Normal breath sounds.  Abdominal:     Palpations: Abdomen is soft.     Tenderness: There is no abdominal tenderness.  Musculoskeletal:        General: No swelling.     Cervical back: Neck supple.  Skin:    General: Skin is warm and dry.     Capillary Refill: Capillary refill takes less than 2 seconds.  Neurological:     Mental Status: He is alert.     Comments: Patient tracks with eyes but does not follow command or move his extremities spontaneously     (all labs ordered are listed, but only abnormal results are displayed) Labs Reviewed  I-STAT CG4 LACTIC ACID, ED - Abnormal; Notable for the following components:      Result Value   Lactic Acid, Venous 2.9 (*)    All  other components within normal limits  RESP PANEL BY RT-PCR (RSV, FLU A&B, COVID)  RVPGX2  CULTURE, BLOOD (ROUTINE X 2)  CULTURE, BLOOD (ROUTINE X 2)  COMPREHENSIVE METABOLIC PANEL WITH GFR  CBC WITH DIFFERENTIAL/PLATELET  PROTIME-INR  URINALYSIS, W/ REFLEX TO CULTURE (INFECTION SUSPECTED)  I-STAT CHEM 8, ED  I-STAT CG4 LACTIC ACID, ED  TROPONIN I (HIGH SENSITIVITY)  TROPONIN I (HIGH SENSITIVITY)    EKG: EKG Interpretation Date/Time:  Monday February 04 2024 14:55:31 EDT Ventricular Rate:  98 PR Interval:  138 QRS Duration:  86 QT Interval:  375 QTC Calculation: 479 R Axis:   249  Text Interpretation: Sinus rhythm Left anterior fascicular block Nonspecific ST changes in teh anterior leads Confirmed by Gennaro Bouchard ([id number]) on 02/04/2024 3:25:03 PM  Radiology: CT Head Wo Contrast Result Date: 02/04/2024 CLINICAL DATA:  Altered mental status. EXAM: CT HEAD WITHOUT CONTRAST TECHNIQUE: Contiguous axial images were obtained from the base of the skull through the vertex without intravenous contrast. RADIATION DOSE REDUCTION: This exam was performed according to the departmental dose-optimization program which includes automated exposure control, adjustment of the mA and/or kV according to patient size and/or use of iterative reconstruction technique. COMPARISON:  Head CT 04/02/2021 FINDINGS: Brain: There is no evidence of an acute infarct, intracranial hemorrhage, mass, midline shift, or extra-axial fluid collection. There is mild generalized cerebral atrophy. Vascular: No hyperdense vessel. Skull: No acute fracture or suspicious lesion. Sinuses/Orbits: Visualized paranasal sinuses and mastoid air cells are clear. No acute abnormality in the included portions of the orbits. Other: None. IMPRESSION: No evidence of acute intracranial abnormality. Electronically Signed   By: Dasie Hamburg M.D.   On: 02/04/2024 16:19   DG Chest Port 1 View Result Date: 02/04/2024 CLINICAL DATA:  Questionable  sepsis - evaluate for abnormality EXAM: PORTABLE CHEST - 1 VIEW COMPARISON:  01/16/2024 FINDINGS: Bilateral perihilar interstitial opacities. Small right pleural effusion with right basilar airspace opacities. Patchy opacities in the left lung base. No pneumothorax. Mild cardiomegaly. Left chest port in place terminating in the high right atrium, unchanged. No acute fracture or destructive lesions. Multilevel thoracic osteophytosis. IMPRESSION: 1. Findings consistent with either pulmonary edema or multifocal pneumonia in the lung bases. 2. Small right pleural effusion. Electronically Signed   By: Rogelia Myers M.D.   On: 02/04/2024 15:25     Procedures   Medications Ordered in the ED  acetaminophen  (TYLENOL ) tablet 650 mg (has no administration in time range)  lactated ringers  bolus 1,000 mL (1,000 mLs Intravenous New Bag/Given 02/04/24 1521)    And  lactated ringers  bolus 1,000 mL (1,000 mLs Intravenous New Bag/Given 02/04/24 1515)  And  lactated ringers  bolus 250 mL (250 mLs Intravenous New Bag/Given 02/04/24 1543)  ceFEPIme  (MAXIPIME ) 2 g in sodium chloride  0.9 % 100 mL IVPB (2 g Intravenous New Bag/Given 02/04/24 1520)  vancomycin  (VANCOCIN ) IVPB 1000 mg/200 mL premix (1,000 mg Intravenous New Bag/Given 02/04/24 1519)                                    Medical Decision Making Cardiac monitor interpretation: Sinus tachycardia, no ectopy  Patient here for altered mental status.  Per wife the last time he became somnolent and behaved like this he was septic.  Patient with fever and tachycardia on initial presentation.  Started sepsis workup and gave him 32 cc/kg of IV fluids as well as broad-spectrum antibiotics.  Found to have pneumonia on x-ray.  Lactate is elevated as well.  Lab workup is still pending at this time.  Patient signed out to oncoming provider at 4:30 PM pending remainder of workup and ultimate disposition.  Problems Addressed: Altered mental status, unspecified altered  mental status type: undiagnosed new problem with uncertain prognosis Pneumonia due to infectious organism, unspecified laterality, unspecified part of lung: acute illness or injury that poses a threat to life or bodily functions Sepsis, due to unspecified organism, unspecified whether acute organ dysfunction present [facility]): acute illness or injury that poses a threat to life or bodily functions  Amount and/or Complexity of Data Reviewed Independent Historian: spouse    Details: Wife at bedside helps provide history regarding patient's previous episode of sepsis in which he behaves like this.  She states he has been nonverbal for 2 days this time External Data Reviewed: notes.    Details: Prior hospital records reviewed and patient with recent admission for sepsis Labs: ordered. Decision-making details documented in ED Course.    Details: Ordered and reviewed by me and mostly pending but lactic acid is elevated Radiology: ordered and independent interpretation performed. Decision-making details documented in ED Course.    Details: Ordered and interpreted by me independently of radiology Chest x-ray: Shows no acute abnormality in the chest  CT chest shows possible multifocal pneumonia ECG/medicine tests: ordered and independent interpretation performed. Decision-making details documented in ED Course.    Details: Ordered and inter by me in the absence of cardiology and shows a sinus rhythm, no STEMI or acute change when compared to prior  Risk OTC drugs. Prescription drug management. Drug therapy requiring intensive monitoring for toxicity. Decision regarding hospitalization. Risk Details: CRITICAL CARE Performed by: Duwaine LITTIE Fusi   Total critical care time: 35 minutes  Critical care time was exclusive of separately billable procedures and treating other patients.  Critical care was necessary to treat or prevent imminent or life-threatening deterioration.  Critical care was time  spent personally by me on the following activities: development of treatment plan with patient and/or surrogate as well as nursing, discussions with consultants, evaluation of patient's response to treatment, examination of patient, obtaining history from patient or surrogate, ordering and performing treatments and interventions, ordering and review of laboratory studies, ordering and review of radiographic studies, pulse oximetry and re-evaluation of patient's condition.   Critical Care Total time providing critical care: 35 minutes     Final diagnoses:  Sepsis, due to unspecified organism, unspecified whether acute organ dysfunction present (HCC)  Pneumonia due to infectious organism, unspecified laterality, unspecified part of lung  Altered mental status, unspecified altered mental status type  ED Discharge Orders     None          Gennaro Duwaine CROME, DO 02/04/24 1623

## 2024-02-04 NOTE — Sepsis Progress Note (Signed)
 Code Sepsis protocol being monitored by eLink.

## 2024-02-04 NOTE — Plan of Care (Addendum)
  Received a call from patient nurse at bedside patient and his wife stating for last 3 to 4 hours that patient does not want to continue any IV medications, any intervention and dialysis anymore and requesting to be discharged from the emergency department right now.  This patient has history of metastatic colon cancer, ESRD on dialysis, hypertension hyperlipidemia diabetes and CHF being admitted for sepsis in the setting of hepatic abscess status post 2.7 L of IV fluid resuscitation currently on broad-spectrum antibiotic coverage with Vanco and cefepime .  Hemodynamically stable.  Discussed with patient's RN, patient himself and with his wife multiple times over phone.  Patient is alert oriented x 4 and he is stating that he is too tired to get any treatment and inform and he wants to go home.  Patient was previously advised to be DNR or comfort care in the setting of metastatic colon cancer however patient chose to remain full code and currently on hospice.  Patient wants to go home right now tonight.  In the setting of sepsis hepatic abscess currently requiring IV antibiotic, it  is not advisable to going home without completing the appropriate medical management, blood culture result and also patient will need ongoing dialysis as well.  In the setting of active infection high risk for bacteremia.  Discussed multiple times with patient and his wife however they remain adamant to go home tonight.  Requested patient to stay in the hospital overnight, give it a second thought and advised that we will reach out to palliative team to discuss further goal of care with patient and wife however they remain adamant to leave right now.  Based on my clinical judgment it is not advisable to go home tonight without completing the medical treatment.  Again patient and wife remain adamant with the decision to leave right away.  If they want to leave they need to sign AGAINST MEDICAL ADVICE paperwork.  Informed  patient that if he develops fever, develop altered mental status and clinically deteriorate he needs to come to emergency department immediately to get medical attention.  Discussed with patient's bedside RN that patient needs to sign AMA paperwork before he leaves facility.  Tabria Steines, MD [facility] 02/04/2024, 10:28 PM

## 2024-02-04 NOTE — ED Notes (Signed)
 CCMD called.

## 2024-02-04 NOTE — ED Notes (Addendum)
 Hi. Jeffrey Young has spoken with his family and he has decided that he no longer wishes to receive medical interventions including dialysis, imaging or medication. He has requested for his code status to be changed to DNR and he has asked to be discharged. Provider Seena, MD informed.

## 2024-02-07 LAB — BLOOD CULTURE ID PANEL (REFLEXED) - BCID2

## 2024-02-07 NOTE — ED Provider Notes (Signed)
 Received notification about positive blood cultures for this patient.  Upon review of the chart, it appears that patient was discharged to hospice on comfort care.  We will not contact family members about the blood cultures given their wishes of comfort care measures.   Charlyn Sora, MD 02/07/24 1901

## 2024-02-10 LAB — CULTURE, BLOOD (ROUTINE X 2)

## 2024-02-11 ENCOUNTER — Telehealth (HOSPITAL_BASED_OUTPATIENT_CLINIC_OR_DEPARTMENT_OTHER): Payer: Self-pay | Admitting: *Deleted

## 2024-02-11 NOTE — Telephone Encounter (Signed)
 Post ED Visit - Positive Culture Follow-up  Culture report reviewed by antimicrobial stewardship pharmacist: [facility] [x]  Koren Or, Pharm.D. []  Venetia Gully, Pharm.D., BCPS AQ-ID []  Garrel Crews, Pharm.D., BCPS []  Almarie Lunger, Pharm.D., BCPS []  [place], [place].D., BCPS, AAHIVP []  Rosaline Bihari, Pharm.D., BCPS, AAHIVP []  Vernell Meier, PharmD, BCPS []  Latanya Hint, PharmD, BCPS []  Donald Medley, PharmD, BCPS []  Rocky Bold, PharmD []  Dorothyann Alert, PharmD, BCPS []  Morene Babe, PharmD  [facility] []  Rosaline Edison, PharmD []  Romona Bliss, PharmD []  Dolphus Roller, PharmD []  Veva Seip, Rph []  Vernell Daunt) Leonce, PharmD []  Eva Allis, PharmD []  Rosaline Millet, PharmD []  Iantha Batch, PharmD []  Arvin Gauss, PharmD []  Wanda Hasting, PharmD []  Ronal Rav, PharmD []  Rocky Slade, PharmD []  Bard Jeans, PharmD   Positive blood culture D/C home w/ Hospice; 02/04/24 MD aware of positive blood cx.  No further patient follow-up is required at this time.  Jeffrey Young 02/11/2024, 11:12 AM

## 2024-02-12 LAB — CULTURE, BLOOD (ROUTINE X 2)

## 2024-02-13 ENCOUNTER — Telehealth: Payer: Self-pay

## 2024-02-13 ENCOUNTER — Telehealth (HOSPITAL_BASED_OUTPATIENT_CLINIC_OR_DEPARTMENT_OTHER): Payer: Self-pay

## 2024-02-13 NOTE — Telephone Encounter (Signed)
 Post ED Visit - Positive Culture Follow-up  Culture report reviewed by antimicrobial stewardship pharmacist: [facility] [x]  Koren Or, Pharm.D. []  Venetia Gully, Pharm.D., BCPS AQ-ID []  Garrel Crews, Pharm.D., BCPS []  Almarie Lunger, [address].D., BCPS []  [place], [address].D., BCPS, AAHIVP []  Rosaline Bihari, Pharm.D., BCPS, AAHIVP []  Vernell Meier, PharmD, BCPS []  Latanya Hint, PharmD, BCPS []  Donald Medley, PharmD, BCPS []  Rocky Bold, PharmD []  Dorothyann Alert, PharmD, BCPS []  Morene Babe, PharmD  [facility] []  Rosaline Edison, PharmD []  Romona Bliss, PharmD []  Dolphus Roller, PharmD []  Veva Seip, Rph []  Vernell Daunt) Leonce, PharmD []  Eva Allis, PharmD []  Rosaline Millet, PharmD []  Iantha Batch, PharmD []  Arvin Gauss, PharmD []  Wanda Hasting, PharmD []  Ronal Rav, PharmD []  Rocky Slade, PharmD []  Bard Jeans, PharmD   Positive blood culture Reviewed by ED provider - Lonni Sakai, MD  Patient discharged home with home hospice.  02/04/23 ED provider noted aware of positive blood culture and contacting family. No further patient follow-up is required at this time.  Jeffrey Young 02/13/2024, 11:37 AM

## 2024-02-13 NOTE — Telephone Encounter (Signed)
 The patient's wife reports that he is resting and doing well. She has requested that the upcoming appointment with the nurse practitioner be canceled. She was advised to contact the clinic if they require any further assistance.

## 2024-02-13 NOTE — Telephone Encounter (Signed)
-----   Message from Olam Ned sent at 02/11/2024  2:53 PM EDT ----- Please call and check on him.  He went to the emergency department 02/04/2024

## 2024-02-19 ENCOUNTER — Other Ambulatory Visit: Payer: Self-pay

## 2024-02-20 ENCOUNTER — Telehealth: Payer: Self-pay

## 2024-02-21 ENCOUNTER — Inpatient Hospital Stay: Attending: Oncology

## 2024-02-21 ENCOUNTER — Inpatient Hospital Stay: Admitting: Nurse Practitioner

## 2024-03-13 NOTE — Discharge Summary (Signed)
 Physician Discharge Summary   Patient: Jeffrey Young MRN: [id number] DOB: Sep 29, 1965  Admit date:     02/04/2024  Discharge date: 02/04/2024  Discharge Physician: Marsa KATHEE Scurry   [facility]P: [facility]   Recommendations at discharge:   Declined further interventions. Declined having palliative/hospice services obtained prior to leaving the hospital. Left AMA after discussion with overnight covering provider.   Recommend palliative/Hospice care.  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  Also covered with empiric antibiotics for fever.  ID was consulted and ultimately patient was discharged with 2 weeks of vancomycin  with HD to end on 9/13.   Patient follow-up with oncology and due to his poor performance status he is not a candidate for chemotherapy.  Recommendation at oncology follow-up was for home hospice/comfort care as well as DNR/DNI.  Patient at that time wish to remain full code   Patient had recently developed fever nausea vomiting and altered mental status.  Has become nonverbal and somnolent wife said this is similar to the last time he had sepsis.  Due to this had not gone to dialysis.  Patient was found to have hepatic abscess leading to sepsis.  Plan was for continued IV antibiotics, cultures, further imaging, close monitoring.  Patient also noted to have pleural effusions which might have needed thoracentesis if not responding to a dialysis.  Nephrology had been consulted to see  patient in the morning for dialysis.   Overnight:  Covering provider received a call from patient nurse at bedside patient and his wife stating for last 3 to 4 hours that patient does not want to continue any IV medications, any intervention and dialysis anymore and requesting to be discharged from the emergency department right away.   Overnight provider held discussion with RN, patient, patient's wife.  At that time patient was alert and oriented and stated he was no longer wanting treatment and wanted to go home.     In the setting of sepsis hepatic abscess currently requiring IV antibiotic, he was advised not to go home without completing medical treatment and workup.    After multiple discussions patient and wife continued to state that they want to return home overnight.  Covering provider recommended overnight stay to have further discussions with palliative medicine per chart review.    Overnight provider determined that if they were to leave it would have to be AGAINST MEDICAL ADVICE and noted this in the chart with patient choosing to leave AGAINST MEDICAL ADVICE.    Provider did give patient return precautions.    Consultants: Nephrology (not seen prior to leaving) Procedures performed: None  Disposition: Extremely gaurded, expected hospice/end of life course (especially with declining further dialysis)  DISCHARGE MEDICATION: Allergies as of 02/04/2024       Reactions   Bee Venom Anaphylaxis, Swelling, Other (See Comments)   Cold Sweats, also   Latex Rash, Dermatitis        Medication List     ASK your doctor about these medications  0.9 % SODIUM CHLORIDE  (POUR BTL) OPTIME Irrigate with 1,000 mLs as directed daily.   amLODipine  10 MG tablet Commonly known as: NORVASC  Take 10 mg by mouth daily.   baclofen  10 MG tablet Commonly known as: LIORESAL  Take 10 mg by mouth daily as needed for muscle spasms.   cadexomer iodine  0.9 % gel Commonly known as:  IODOSORB Apply 1 Application topically daily as needed for wound care.   EPINEPHrine  0.3 mg/0.3 mL Soaj injection Commonly known as: EPI-PEN Inject 0.3 mg into the muscle as needed for anaphylaxis.   ergocalciferol 1.25 MG (50000 UT) capsule Commonly known as: VITAMIN D2 Take 50,000 Units by mouth once a week. Take on Monday   feeding supplement (NEPRO CARB STEADY) Liqd Take 237 mLs by mouth daily. Mixed berry   ferrous sulfate 325 (65 FE) MG EC tablet Take 325 mg by mouth daily with breakfast.   Lantus  SoloStar 100 UNIT/ML Solostar Pen Generic drug: insulin  glargine Inject 10-15 Units into the skin See admin instructions. Inject 15 units into the skin in the morning and 10 units into the skin at bedtime   loperamide 2 MG capsule Commonly known as: IMODIUM Take 2 mg by mouth daily as needed for diarrhea or loose stools.   losartan 50 MG tablet Commonly known as: COZAAR Take 50 mg by mouth daily.   mirtazapine  15 MG tablet Commonly known as: REMERON  Take 7.5 mg by mouth at bedtime.        Discharge Exam: There were no vitals filed for this visit.  Condition at discharge: worsening  The results of significant diagnostics from this hospitalization (including imaging, microbiology, ancillary and laboratory) are listed below for reference.   Imaging Studies: No results found.  Microbiology: Results for orders placed or performed during the hospital encounter of 02/04/24  Culture, blood (routine x 2)     Status: Abnormal   Collection Time: 02/04/24  3:10 PM   Specimen: BLOOD  Result Value Ref Range Status   Specimen Description BLOOD SITE NOT SPECIFIED  Final   Special Requests   Final    BOTTLES DRAWN AEROBIC AND ANAEROBIC Blood Culture results may not be optimal due to an inadequate volume of blood received in culture bottles   Culture  Setup Time   Final    GRAM NEGATIVE RODS ANAEROBIC BOTTLE ONLY CRITICAL RESULT CALLED TO, READ BACK BY AND VERIFIED WITH: RN  ITALY GROSE ON 02/07/24 @ 1328 BY DRT    Culture (A)  Final    ANAEROBIC GRAM NEGATIVE ROD BETA LACTAMASE NEGATIVE CALL MICROBIOLOGY LAB IF SENSITIVITIES ARE REQUIRED. Performed at [facility], [address] [place], [place] [address]    Report Status 02/10/2024 FINAL  Final  Blood Culture ID Panel (Reflexed)     Status: None   Collection Time: 02/04/24  3:10 PM  Result Value Ref Range Status   Enterococcus faecalis NOT DETECTED NOT DETECTED Final   Enterococcus Faecium NOT DETECTED NOT DETECTED Final   Listeria monocytogenes NOT DETECTED NOT DETECTED Final   Staphylococcus species NOT DETECTED NOT DETECTED Final   Staphylococcus aureus (BCID) NOT DETECTED NOT DETECTED Final   Staphylococcus epidermidis NOT DETECTED NOT DETECTED Final   Staphylococcus lugdunensis NOT DETECTED NOT DETECTED Final   Streptococcus species NOT DETECTED NOT DETECTED Final   Streptococcus agalactiae NOT DETECTED NOT DETECTED Final   Streptococcus pneumoniae NOT DETECTED NOT DETECTED Final   Streptococcus pyogenes NOT DETECTED NOT DETECTED Final   A.calcoaceticus-baumannii NOT DETECTED NOT DETECTED  Final   Bacteroides fragilis NOT DETECTED NOT DETECTED Final   Enterobacterales NOT DETECTED NOT DETECTED Final   Enterobacter cloacae complex NOT DETECTED NOT DETECTED Final   Escherichia coli NOT DETECTED NOT DETECTED Final   Klebsiella aerogenes NOT DETECTED NOT DETECTED Final   Klebsiella oxytoca NOT DETECTED NOT DETECTED Final   Klebsiella pneumoniae NOT DETECTED NOT DETECTED Final   Proteus species NOT DETECTED NOT DETECTED Final   Salmonella species NOT DETECTED NOT DETECTED Final   Serratia marcescens NOT DETECTED NOT DETECTED Final   Haemophilus influenzae NOT DETECTED NOT DETECTED Final   Neisseria meningitidis NOT DETECTED NOT DETECTED Final   Pseudomonas aeruginosa NOT DETECTED NOT DETECTED Final   Stenotrophomonas maltophilia NOT DETECTED NOT DETECTED Final   Candida albicans NOT DETECTED  NOT DETECTED Final   Candida auris NOT DETECTED NOT DETECTED Final   Candida glabrata NOT DETECTED NOT DETECTED Final   Candida krusei NOT DETECTED NOT DETECTED Final   Candida parapsilosis NOT DETECTED NOT DETECTED Final   Candida tropicalis NOT DETECTED NOT DETECTED Final   Cryptococcus neoformans/gattii NOT DETECTED NOT DETECTED Final    Comment: Performed at [facility], [address], [place], [place] [address]  Culture, blood (routine x 2)     Status: Abnormal   Collection Time: 02/04/24  3:15 PM   Specimen: BLOOD  Result Value Ref Range Status   Specimen Description BLOOD SITE NOT SPECIFIED  Final   Special Requests   Final    BOTTLES DRAWN AEROBIC AND ANAEROBIC Blood Culture results may not be optimal due to an inadequate volume of blood received in culture bottles   Culture  Setup Time   Final    GRAM NEGATIVE RODS ANAEROBIC BOTTLE ONLY CRITICAL VALUE NOTED.  VALUE IS CONSISTENT WITH PREVIOUSLY REPORTED AND CALLED VALUE. CORRECTED ON 09/20 AT 1216: PREVIOUSLY REPORTED AS Organism ID to follow    Culture (A)  Final    STAPHYLOCOCCUS CAPITIS THE SIGNIFICANCE OF ISOLATING THIS ORGANISM FROM A SINGLE SET OF BLOOD CULTURES WHEN MULTIPLE SETS ARE DRAWN IS UNCERTAIN. PLEASE NOTIFY THE MICROBIOLOGY DEPARTMENT WITHIN ONE WEEK IF SPECIATION AND SENSITIVITIES ARE REQUIRED. ANAEROBIC GRAM NEGATIVE ROD UNABLE TO ID NON VIABLE ORGANISM Performed at [facility], [address] [place], [place] [address]    Report Status 02/12/2024 FINAL  Final  Resp panel by RT-PCR (RSV, Flu A&B, Covid) Anterior Nasal Swab     Status: None   Collection Time: 02/04/24  3:53 PM   Specimen: Anterior Nasal Swab  Result Value Ref Range Status   SARS Coronavirus 2 by RT PCR NEGATIVE NEGATIVE Final   Influenza A by PCR NEGATIVE NEGATIVE Final   Influenza B by PCR NEGATIVE NEGATIVE Final    Comment: (NOTE) The Xpert Xpress SARS-CoV-2/FLU/RSV plus assay is intended as an aid in the diagnosis of  influenza from Nasopharyngeal swab specimens and should not be used as a sole basis for treatment. Nasal washings and aspirates are unacceptable for Xpert Xpress SARS-CoV-2/FLU/RSV testing.  Fact Sheet for Patients: BloggerCourse.com  Fact Sheet for Healthcare Providers: SeriousBroker.it  This test is not yet approved or cleared by the [place]  FDA and has been authorized for detection and/or diagnosis of SARS-CoV-2 by FDA under an Emergency Use Authorization (EUA). This EUA will remain in effect (meaning this test can be used) for the duration of the COVID-19 declaration under Section 564(b)(1) of the Act, 21 U.S.C. section 360bbb-3(b)(1), unless the authorization is terminated or revoked.  Resp Syncytial Virus by PCR NEGATIVE NEGATIVE Final    Comment: (NOTE) Fact Sheet for Patients: BloggerCourse.com  Fact Sheet for Healthcare Providers: SeriousBroker.it  This test is not yet approved or cleared by the [place]  FDA and has been authorized for detection and/or diagnosis of SARS-CoV-2 by FDA under an Emergency Use Authorization (EUA). This EUA will remain in effect (meaning this test can be used) for the duration of the COVID-19 declaration under Section 564(b)(1) of the Act, 21 U.S.C. section 360bbb-3(b)(1), unless the authorization is terminated or revoked.  Performed at [facility], [address], [place], [place] [address]     Labs: CBC: No results for input(s): WBC, NEUTROABS, HGB, HCT, MCV, PLT in the last 168 hours. Basic Metabolic Panel: No results for input(s): NA, K, CL, CO2, GLUCOSE, BUN, CREATININE, CALCIUM , MG, PHOS in the last 168 hours. Liver Function Tests: No results for input(s): AST, ALT, ALKPHOS, BILITOT, PROT, ALBUMIN  in the last 168 hours. CBG: No results for input(s): GLUCAP in the  last 168 hours.  Discharge time spent: less than 30 minutes.  Signed: Marsa KATHEE Scurry, MD [facility] 03/13/2024

## 2024-03-22 NOTE — Telephone Encounter (Signed)
 [employer] notified us  that the patient passed away at 4:08 PM on March 06, 2024, with family and friends present.

## 2024-03-22 DEATH — deceased
